# Patient Record
Sex: Female | Born: 1940
Health system: Southern US, Community
[De-identification: ages and names within clinical notes are randomized; demographics above are authoritative.]

## PROBLEM LIST (undated history)

## (undated) DIAGNOSIS — K589 Irritable bowel syndrome without diarrhea: Secondary | ICD-10-CM

## (undated) DIAGNOSIS — M349 Systemic sclerosis, unspecified: Secondary | ICD-10-CM

## (undated) DIAGNOSIS — K219 Gastro-esophageal reflux disease without esophagitis: Secondary | ICD-10-CM

## (undated) DIAGNOSIS — E785 Hyperlipidemia, unspecified: Secondary | ICD-10-CM

## (undated) DIAGNOSIS — E079 Disorder of thyroid, unspecified: Secondary | ICD-10-CM

## (undated) DIAGNOSIS — M1990 Unspecified osteoarthritis, unspecified site: Secondary | ICD-10-CM

## (undated) DIAGNOSIS — F32A Depression, unspecified: Secondary | ICD-10-CM

## (undated) DIAGNOSIS — I7300 Raynaud's syndrome without gangrene: Secondary | ICD-10-CM

## (undated) DIAGNOSIS — E039 Hypothyroidism, unspecified: Secondary | ICD-10-CM

## (undated) DIAGNOSIS — I10 Essential (primary) hypertension: Secondary | ICD-10-CM

## (undated) DIAGNOSIS — H269 Unspecified cataract: Secondary | ICD-10-CM

## (undated) DIAGNOSIS — R0600 Dyspnea, unspecified: Secondary | ICD-10-CM

## (undated) HISTORY — DX: Raynaud's syndrome without gangrene: I73.00

## (undated) HISTORY — DX: Dyspnea, unspecified: R06.00

## (undated) HISTORY — DX: Unspecified osteoarthritis, unspecified site: M19.90

## (undated) HISTORY — PX: HAND SURGERY: SHX662

## (undated) HISTORY — DX: Systemic sclerosis, unspecified: M34.9

## (undated) HISTORY — PX: MELANOMA EXCISION: SHX5266

## (undated) HISTORY — DX: Unspecified cataract: H26.9

## (undated) HISTORY — DX: Disorder of thyroid, unspecified: E07.9

---

## 1971-07-08 HISTORY — PX: CHOLECYSTECTOMY: SHX55

## 1972-07-07 HISTORY — PX: TOTAL ABDOMINAL HYSTERECTOMY: SHX209

## 1973-07-07 HISTORY — PX: BREAST ENHANCEMENT SURGERY: SHX7

## 1999-08-26 ENCOUNTER — Other Ambulatory Visit: Admission: RE | Admit: 1999-08-26 | Discharge: 1999-08-26 | Payer: Self-pay | Admitting: Obstetrics and Gynecology

## 2000-08-25 ENCOUNTER — Ambulatory Visit (HOSPITAL_COMMUNITY): Admission: RE | Admit: 2000-08-25 | Discharge: 2000-08-25 | Payer: Self-pay | Admitting: Gastroenterology

## 2000-08-25 ENCOUNTER — Encounter (INDEPENDENT_AMBULATORY_CARE_PROVIDER_SITE_OTHER): Payer: Self-pay | Admitting: Specialist

## 2000-10-06 ENCOUNTER — Emergency Department (HOSPITAL_COMMUNITY): Admission: EM | Admit: 2000-10-06 | Discharge: 2000-10-06 | Payer: Self-pay | Admitting: Emergency Medicine

## 2000-10-18 ENCOUNTER — Emergency Department (HOSPITAL_COMMUNITY): Admission: EM | Admit: 2000-10-18 | Discharge: 2000-10-18 | Payer: Self-pay | Admitting: Internal Medicine

## 2005-04-16 ENCOUNTER — Ambulatory Visit (HOSPITAL_COMMUNITY): Admission: RE | Admit: 2005-04-16 | Discharge: 2005-04-16 | Payer: Self-pay | Admitting: Family Medicine

## 2005-12-11 ENCOUNTER — Ambulatory Visit (HOSPITAL_COMMUNITY): Admission: RE | Admit: 2005-12-11 | Discharge: 2005-12-11 | Payer: Self-pay | Admitting: Family Medicine

## 2009-05-22 ENCOUNTER — Ambulatory Visit: Payer: Self-pay | Admitting: Vascular Surgery

## 2010-04-15 ENCOUNTER — Encounter: Payer: Self-pay | Admitting: Internal Medicine

## 2010-05-02 ENCOUNTER — Encounter: Payer: Self-pay | Admitting: Internal Medicine

## 2010-07-12 ENCOUNTER — Encounter: Payer: Self-pay | Admitting: Internal Medicine

## 2010-07-12 ENCOUNTER — Ambulatory Visit
Admission: RE | Admit: 2010-07-12 | Discharge: 2010-07-12 | Payer: Self-pay | Source: Home / Self Care | Attending: Internal Medicine | Admitting: Internal Medicine

## 2010-07-27 ENCOUNTER — Encounter: Payer: Self-pay | Admitting: Family Medicine

## 2010-08-06 DIAGNOSIS — I7300 Raynaud's syndrome without gangrene: Secondary | ICD-10-CM | POA: Insufficient documentation

## 2010-08-06 DIAGNOSIS — M349 Systemic sclerosis, unspecified: Secondary | ICD-10-CM | POA: Insufficient documentation

## 2010-08-06 DIAGNOSIS — M1990 Unspecified osteoarthritis, unspecified site: Secondary | ICD-10-CM | POA: Insufficient documentation

## 2010-08-07 ENCOUNTER — Encounter: Payer: Self-pay | Admitting: Internal Medicine

## 2010-08-07 ENCOUNTER — Institutional Professional Consult (permissible substitution) (INDEPENDENT_AMBULATORY_CARE_PROVIDER_SITE_OTHER): Payer: Medicare Other | Admitting: Internal Medicine

## 2010-08-07 ENCOUNTER — Ambulatory Visit: Admit: 2010-08-07 | Payer: Self-pay | Admitting: Internal Medicine

## 2010-08-07 DIAGNOSIS — M349 Systemic sclerosis, unspecified: Secondary | ICD-10-CM

## 2010-08-07 DIAGNOSIS — C439 Malignant melanoma of skin, unspecified: Secondary | ICD-10-CM | POA: Insufficient documentation

## 2010-08-07 DIAGNOSIS — R0609 Other forms of dyspnea: Secondary | ICD-10-CM

## 2010-08-07 DIAGNOSIS — R0989 Other specified symptoms and signs involving the circulatory and respiratory systems: Secondary | ICD-10-CM | POA: Insufficient documentation

## 2010-08-14 NOTE — Assessment & Plan Note (Signed)
Summary: Pulmonary/ new pt eval for ? PSS> sats on RA= ok x 3 laps    Vital Signs:  Patient profile:   70 year old female Height:      62.5 inches Weight:      129 pounds BMI:     23.30 O2 Sat:      100 % on Room air Temp:     98.2 degrees F oral Pulse rate:   85 / minute BP sitting:   120 / 82  (left arm) Cuff size:   regular  Vitals Entered By: Vernie Murders (August 07, 2010 9:44 AM)  O2 Flow:  Room air  Serial Vital Signs/Assessments:  Comments: 10:34 AM Ambulatory Pulse Oximetry  Resting; HR__71___    02 Sat__99%ra___  Lap1 (185 feet)   HR__97___   02 Sat__98%ra___ Lap2 (185 feet)   HR__102___   02 Sat__98%ra___    Lap3 (185 feet)   HR__99___   02 Sat__98%ra___  _x__Test Completed without Difficulty ___Test Stopped due to:   By: Vernie Murders    Visit Type:  Initial Consult Copy to:  Dr. Dierdre Forth Primary Provider/Referring Provider:  Gennette Pac, FNP  CC:  Dyspnea and fatigue.  History of Present Illness: 89 yowf never smoker with arthritis x 2000 then Raynaud's started 2009 then referred tp  Dierdre Forth by Dr Kathi Der office with dx of Scleroderma  in November 2011.  August 07, 2010  1st pulmonary office eval cc doe x  summer 2011 eg up steps, up from garden to house,  heavy housework.   Some dysphagia x one year stopped prilosec x 6 months due to concerns it was interfering B 12. minimal dry cough since then.  mild dysphagia.  Pt denies any significant sore throat, itching, sneezing,  nasal congestion or excess secretions,  fever, chills, sweats, unintended wt loss, pleuritic or exertional cp, hempoptysis, change in activity tolerance  orthopnea pnd or leg swelling. Pt also denies any obvious fluctuation in symptoms with weather or environmental change or other alleviating or aggravating factors.        Preventive Screening-Counseling & Management  Alcohol-Tobacco     Passive Smoke Exposure: yes  Current Medications (verified): 1)  Vitamin B-12 1000  Mcg/39ml Liqd (Cyanocobalamin) .... Every Month 2)  Synthroid 50 Mcg Tabs (Levothyroxine Sodium) .Marland Kitchen.. 1 Once Daily 3)  Fluoxetine Hcl 40 Mg Caps (Fluoxetine Hcl) .Marland Kitchen.. 1 Once Daily 4)  Clonazepam 1 Mg Tabs (Clonazepam) .Marland Kitchen.. 1 Once Daily 5)  Amlodipine Besylate 5 Mg Tabs (Amlodipine Besylate) .... 2 Two Times A Day 6)  Tramadol Hcl 50 Mg Tabs (Tramadol Hcl) .Marland Kitchen.. 1 Every 6 Hrs As Needed For Pain 7)  Voltaren 1 % Gel (Diclofenac Sodium) .... As Needed Up To 4 Times Per Day 8)  Phillips Colon Health  Caps (Probiotic Product) .Marland Kitchen.. 1 At Bedtime 9)  Vitamin D3 400 Unit Tabs (Cholecalciferol) .Marland Kitchen.. 1 At Bedtime 10)  Aspirin 81 Mg Tbec (Aspirin) .Marland Kitchen.. 1 At Bedtime 11)  Preservision Areds  Caps (Multiple Vitamins-Minerals) .Marland Kitchen.. 1 At Bedtime 12)  Tums E-X 750 750 Mg Chew (Calcium Carbonate Antacid) .... 2 At Bedtime  Allergies (verified): 1)  ! Codeine  Past History:  Past Medical History:  [name] (ICD-443.0) SCLERODERMA (ICD-710.1).......................Marland KitchenBeekman     - Echo 05/02/2010 WNL, NO  PAH OSTEOARTHRITIS (ICD-715.90) Dyspnea     - PFT's 07/12/2010 VC 99% and DLCO 62% with erratic FV loop     - Walking sats nl August 07, 2010 x 3  laps  Past Surgical History: Cholecystectomy 1973 Hysterectomy 1974 Breast augmentation 1975  Family History: Pancreatic CA- Father Sclerderma daughter  Social History: Married Children Never smoker No ETOH Retired- Nutritional therapist at [employer] Positive history of passive tobacco smoke exposure.  Passive Smoke Exposure:  yes  Review of Systems       The patient complains of shortness of breath with activity, non-productive cough, loss of appetite, difficulty swallowing, sneezing, itching, anxiety, depression, and joint stiffness or pain.  The patient denies shortness of breath at rest, productive cough, coughing up blood, chest pain, irregular heartbeats, acid heartburn, indigestion, weight change, abdominal pain, sore throat, tooth/dental  problems, headaches, nasal congestion/difficulty breathing through nose, ear ache, hand/feet swelling, rash, change in color of mucus, and fever.    Physical Exam  Additional Exam:  pleasant but anxious wf nad HEENT: nl dentition, turbinates, and orophanx. Nl external ear canals without cough reflex NECK :  without JVD/Nodes/TM/ nl carotid upstrokes bilaterally LUNGS: no acc muscle use, clear to A and P bilaterally without cough on insp or exp maneuvers CV:  RRR  no s3 or murmur or increase in P2, no edema  ABD:  soft and nontender with nl excursion in the supine position. No bruits or organomegaly, bowel sounds nl MS:  warm without deformities, calf tenderness, cyanosis or clubbing ? minimal sclerodactaly  SKIN: warm and dry without lesions   NEURO:  alert, approp, no deficits     Impression & Recommendations:  Problem # 1:  SCLERODERMA (ICD-710.1)  No evidence of sign ILD or PAH but needs serial f/u with repeat studies in 3 months then yearly unless change in symptoms  Discussed in detail all the  indications, usual  risks and alternatives  relative to the benefits with patient who agrees to proceed with conservative f/u for now      Problem # 2:  DYSPNEA (ICD-786.09)  Not reproduced here nor desat but assoc with cough off ppi,  dysphagia and erratic FV loop so needs trial of PPI/ H2 hs since most likely has element of es dysfunction from scleroderma with f/u GI if dysphagia not better on RX    Medications Added to Medication List This Visit: 1)  Vitamin B-12 1000 Mcg/11ml Liqd (Cyanocobalamin) .... Every month 2)  Synthroid 50 Mcg Tabs (Levothyroxine sodium) .Marland Kitchen.. 1 once daily 3)  Fluoxetine Hcl 40 Mg Caps (Fluoxetine hcl) .Marland Kitchen.. 1 once daily 4)  Clonazepam 1 Mg Tabs (Clonazepam) .Marland Kitchen.. 1 once daily 5)  Amlodipine Besylate 5 Mg Tabs (Amlodipine besylate) .... 2 two times a day 6)  Tramadol Hcl 50 Mg Tabs (Tramadol hcl) .Marland Kitchen.. 1 every 6 hrs as needed for pain 7)  Voltaren 1 % Gel  (Diclofenac sodium) .... As needed up to 4 times per day 8)  Phillips Colon Health Caps (Probiotic product) .Marland Kitchen.. 1 at bedtime 9)  Vitamin D3 400 Unit Tabs (Cholecalciferol) .Marland Kitchen.. 1 at bedtime 10)  Aspirin 81 Mg Tbec (Aspirin) .Marland Kitchen.. 1 at bedtime 11)  Preservision Areds Caps (Multiple vitamins-minerals) .Marland Kitchen.. 1 at bedtime 12)  Tums E-x 750 750 Mg Chew (Calcium carbonate antacid) .... 2 at bedtime 13)  Prilosec 20 Mg Cpdr (Omeprazole) .... One take  one 30-60 min before first meal of the day 14)  Pepcid 20 Mg Tabs (Famotidine) .... Take one by mouth at bedtime  Other Orders: New Patient Level V ([address]) Pulse Oximetry, Ambulatory ([address]) Prescription Created Electronically [id number])  Patient Instructions: 1)  Prilosec 20 mg Take  one 30-60 min  before first meal of the day and Pepcid 20 mg one at bedtime 2)  No tums, take calcium citrate a total of 1500 mg per day 3)  GERD (REFLUX)  is a common cause of respiratory symptoms. It commonly presents without heartburn and can be treated with medication, but also with lifestyle changes including avoidance of late meals, excessive alcohol, smoking cessation, and avoid fatty foods, chocolate, peppermint, colas, red wine, and acidic juices such as orange juice. NO MINT OR MENTHOL PRODUCTS SO NO COUGH DROPS  4)  USE SUGARLESS CANDY INSTEAD (jolley ranchers)  5)  NO OIL BASED VITAMINS  6)  Return to office in 3 months, sooner if needed with PFT's and cxr Prescriptions: PEPCID 20 MG TABS (FAMOTIDINE) take one by mouth at bedtime  #30 x 5   Entered by:   Carver Fila   Authorized by:   Nyoka Cowden MD   Signed by:   Carver Fila on 08/07/2010   Method used:   Electronically to        [facility]  [address] (retail)       [address]       [place], [place]  [address]       Ph: [phone]       Fax: [phone]   RxID:   [id number] PRILOSEC 20 MG CPDR (OMEPRAZOLE) one Take  one 30-60 min before first meal of the day  #30 x 5   Entered  by:   Carver Fila   Authorized by:   Nyoka Cowden MD   Signed by:   Carver Fila on 08/07/2010   Method used:   Electronically to        [facility]  [address] (retail)       [address]       [place], [place]  [address]       Ph: [phone]       Fax: [phone]   RxID:   [id number] PEPCID 20 MG TABS (FAMOTIDINE) take one by mouth at bedtime  #34 x 11   Entered and Authorized by:   Nyoka Cowden MD   Signed by:   Nyoka Cowden MD on 08/07/2010   Method used:   Electronically to        [facility]  [address] (retail)       [address]       [place], [place]  [address]       Ph: [phone]       Fax: [phone]   RxID:   [id number] PRILOSEC 20 MG CPDR (OMEPRAZOLE) one Take  one 30-60 min before first meal of the day  #34 x 11   Entered and Authorized by:   Nyoka Cowden MD   Signed by:   Nyoka Cowden MD on 08/07/2010   Method used:   Electronically to        [facility]  [address] (retail)       [address]       [place], [place]  [address]       Ph: [phone]       Fax: [phone]   RxID:   [id number]    Orders Added: 1)  New Patient Level V [[address]] 2)  Pulse Oximetry, Ambulatory [  [address]] 3)  Prescription Created Electronically [phone]     CXR  Procedure date:  04/15/2010  Findings:      No acute findings  Appended Document: Pulmonary/ new pt eval for ? PSS> sats on RA= ok x 3 laps  copy to Dr Dierdre Forth and Otilio Carpen at Dr [facility] office

## 2010-10-03 ENCOUNTER — Other Ambulatory Visit: Payer: Self-pay | Admitting: Internal Medicine

## 2010-10-03 DIAGNOSIS — M79641 Pain in right hand: Secondary | ICD-10-CM

## 2010-10-09 ENCOUNTER — Ambulatory Visit
Admission: RE | Admit: 2010-10-09 | Discharge: 2010-10-09 | Disposition: A | Discharge status: Home / Self Care | Payer: Medicare Other | Source: Ambulatory Visit | Attending: Internal Medicine | Admitting: Internal Medicine

## 2010-10-09 DIAGNOSIS — M79641 Pain in right hand: Secondary | ICD-10-CM

## 2010-10-09 MED ORDER — GADOBENATE DIMEGLUMINE 529 MG/ML IV SOLN
10.0000 mL | Freq: Once | INTRAVENOUS | Status: AC | PRN
  Administered 2010-10-09: 10 mL via INTRAVENOUS

## 2010-11-19 NOTE — Consult Note (Signed)
NEW PATIENT CONSULTATION   OLLA, DELANCEY  DOB:  1941/03/29                                       05/22/2009  ZOXWR#:[id number]   The patient is a 70 year old female patient referred for evaluation for  possible [name].  She states that over the past few years she  has had increasing symptoms of numbness intermittently primarily in the  hands but occasionally in the feet, the right worse than the left.  This  usually occurs in the third and fourth digits and she notices that her  fingers become quite white and pale in appearance followed by numbness.  She warms the hands by either sitting on them or putting them in warm  water, these symptoms eventually resolve but there is a short period of  discomfort associated with that.  She has tried Norvasc 5 mg per day  over the past few months and is not certain that this is changing her  symptomatology.  She has had no history of gangrene, infection or other  symptoms and denies upper or lower extremity claudication symptoms,  being able to ambulate long distances.  Her father had Raynaud's  disease.   CHRONIC MEDICAL PROBLEMS/PAST MEDICAL HISTORY:  1. Hyperlipidemia.  2. Hypothyroidism.  3. Previous melanoma removed from her back many years ago.  4. Negative for diabetes, hypertension, coronary artery disease, COPD      or stroke.   FAMILY HISTORY:  Positive for diabetes in her mother, Raynaud's in her  father who was a smoker and who died of pancreatic cancer.   SOCIAL HISTORY:  She is married, has 2 children, is retired.  Does not  use tobacco or alcohol.   REVIEW OF SYSTEMS:  Positive for weight gain, chest tightness  occasionally, dyspnea on exertion, dizziness, arthritis, joint pain,  muscle pain, depression and nervousness.  All other systems are  negative.   ALLERGIES:  To codeine, Vicodin, Talwin, Dilaudid, Lomotil, statins,  over-the-counter decongestants.   PHYSICAL EXAM:  Vital  signs:  Blood pressure is 134/58, heart rate 60,  respirations 14, temperature 97.4.  General:  She is a healthy-appearing  female in no apparent distress, alert and oriented x3.  Neck:  Supple,  3+ carotid pulses palpable.  No bruits are audible.  Neurological:  Normal.  No palpable adenopathy in the neck.  Chest:  Clear to  auscultation.  Cardiovascular:  Regular rhythm.  No murmurs.  Abdomen:  Soft, nontender with no masses.  She has 3+ femoral, popliteal,  posterior tibial and dorsalis pedis pulses bilaterally.  Both feet are  well-perfused with no edema.  Upper extremity exam reveals 3+ brachial,  radial and 2+ ulnar pulses bilaterally.  All fingers are pink and well-  perfused.   I ordered an upper extremity Doppler exam with cold immersion technique  to look for vasospasm.  She has normal waveforms and pressures at rest.  After cold immersion she does have abnormal waveforms particularly in  her right third and fourth digits which are damped and flattened  consistent with Raynaud's.   I think she does have Raynaud's syndrome and I explained to her that the  most important thing was try to avoid the situations which cause this  which could be cold weather in many occurrences and also to avoid  smoking which she does not do.  She has  tried beta-blockers which  sometimes can help and calcium channel blocker can also sometimes be  beneficial but not consistently.  I have no other suggestions for her  and do not think this will lead to any loss of digits or other serious  problems other than inconvenience.   Quita Skye Hart [place], M.D.  Electronically Signed   JDL/MEDQ  D:  05/22/2009  T:  05/23/2009  Job:  [id number]   cc:   Paulene Floor, NP

## 2010-11-22 NOTE — Procedures (Signed)
[facility]  Patient:    Haley Trevino, Haley Trevino                   MRN: [id number] Proc. Date: 08/25/00 Adm. Date:  [id number] Attending:  Nelda Marseille CC:         Doreatha Lew, M.D.   Procedure Report  PROCEDURE:  Esophagogastroduodenoscopy with biopsy.  ENDOSCOPIST:  Petra Kuba, M.D.  INDICATIONS:  Patient with long-term upper tract symptoms.  Want to confirm hiatal hernia, rule out Barretts.  Consent was signed after risks, benefits, methods, and options were thoroughly discussed in the office.  ADDITIONAL MEDICINES USED FOR THIS PROCEDURE:  Demerol 10 mg, Versed 2 mg.  DESCRIPTION OF PROCEDURE:  Video endoscope was inserted by direct vision.  The proximal and mid esophagus were normal.  In the distal esophagus was a tiny hiatal hernia with some very minimal distal esophagitis.  A few scattered biopsies of this area were obtained at the end of the procedure.  The scope was advanced into the stomach, advanced through normal pylorus into a normal duodenal bulb and around the cecum to normal second portion of the duodenum. The scope was then withdrawn back to the bulb, and a good look there ruled out abnormalities at that location.  A quick look at the ampulla on withdrawal was normal.  The scope was withdrawn back to the stomach and retroflexed.  The cardia, fundus, angularis, lesser and greater curve, and were all normal.  The scope was straightened and straight visualization of the stomach was normal except for some minimal antritis.  Air was suctioned.  The scope was slowly withdrawn back to about 18 cm.  No additional esophageal findings were seen. The scope was then advanced to the distal esophagus.  Biopsies were obtained at this point.  The scope was reinserted into the stomach.  Air was suctioned. The scope was removed.  The patient tolerated the procedure well, and there was no obvious immediate complication.  ENDOSCOPIC  DIAGNOSES: 1. Tiny hiatal hernia. 2. Minimal distal esophagitis status post biopsy. 3. Minimal antritis. 6. Otherwise normal esophagogastroduodenoscopy.  PLAN:    Continue Nexium.  Await pathology.  GI followup p.r.n. or in two months recheck symptoms to make sure no further workup plans are needed. DD:  08/25/00 TD:  08/25/00 Job: [id number]

## 2010-11-22 NOTE — Procedures (Signed)
[facility]  Patient:    Haley Trevino, Haley Trevino                   MRN: [id number] Proc. Date: 08/25/00 Adm. Date:  [id number] Attending:  Nelda Marseille CC:         Doreatha Lew, M.D.   Procedure Report  PROCEDURES PERFORMED:  Colonoscopy with polypectomy.  INDICATIONS:  Patient with some lower bowel complaints, bright red blood per rectum, due for colonic screening, questionable family history of colon cancer in her father. Consent was signed after risks and benefits, methods and options were thoroughly discussed in the office with the patient and his wife.  MEDICINES USED:  Demerol 70 mg, Versed 10 mg.  DESCRIPTION OF PROCEDURE:  Rectal inspection is pertinent for external hemorrhoids, small. Digital exam was negative. The pediatric video colonoscope was inserted with some difficulty due to a tortuous sigmoid with some looping. We were able to roll her on her back using more right sided than left abdominal pressure and able to advance to the cecum.  On insertion, normal abnormalities were seen. The cecum was identified by the appendiceal orifice and the ileocecal valve. Prep was adequate; there was some liquid stool that required suctioning. Only on slow withdrawal through the colon at the approximate level of the splenic flexure, a questionable tiny polyp was seen which was cold biopsied x 2. No right sided lesions were seen. The scope was slowly withdrawn back through the left side of the colon and in the distal sigmoid a 2 mm polyp was seen and hot biopsied x 1. The scope was slowly withdrawn back to the rectum and retroflexed and pertinent for some internal hemorrhoids. The scope was straightened, the air was withdrawn, and the scope removed. The patient tolerated the procedure well. There was no obvious immediate complication.  ENDOSCOPIC DIAGNOSES: 1. Internal and external hemorrhoids. 2. Distal sigmoid small polyp hot biopsied. 3.  Proximal level of the splenic flexure a tiny polyp cold biopsied. 4. Otherwise within normal limits to the cecum.  PLAN:  Await pathology to determine future colonic screening, otherwise continue workup with a 1-time esophagogastroduodenoscopy to rule out Barretts. DD:  08/25/00 TD:  08/25/00 Job: [id number]

## 2010-11-22 NOTE — Procedures (Signed)
[facility]  Patient:    Haley Trevino, Haley Trevino                   MRN: [id number] Proc. Date: 08/25/00 Adm. Date:  [id number] Attending:  Nelda Marseille CC:         Doreatha Lew, M.D.   Procedure Report  PROCEDURE:  Colonoscopy with polypectomy.  ENDOSCOPIST:  Petra Kuba, M.D.  INDICATIONS:  Patient with some lower bowel complaints, bright red blood per rectum, due for colonic screening.  Questionable family history of colon cancer in father.  Consent was signed after risks, benefits, methods, and options were thoroughly discussed in the office.  MEDICINES USED:  Demerol 70, Versed 10.  DESCRIPTION OF PROCEDURE:  Rectal inspection was pertinent for external hemorrhoids.  Digital exam was negative.  The pediatric video colonoscope was inserted and with some difficulty due to a tortuous sigmoid with some looping, we were able to roll her on her back, used more right-sided than left abdominal pressure, and able to around to the cecum.  On insertion, no abnormalities were seen.  The cecum was identified by the appendiceal orifice and the ileocecal valve. Prep was adequate.  There was some liquid stool that required suctioning only.  On slow withdrawal through the colon, at the approximate level of the splenic flexure, a questionable tiny polyp was seen and was biopsied x 2.  No right-sided lesions were seen.  The scope was slowly withdrawn back through the left side of the colon.  In the distal sigmoid, a 2 mm was seen and hot biopsied x 1.  The scope was slowly withdrawn back to the rectum and retroflexed, pertinent for some internal hemorrhoids.  The scope was straightened, air was withdrawn, scope was removed.  The patient tolerated the procedure well.  There was no obvious immediate complication.  ENDOSCOPIC DIAGNOSIS: 1. Internal and external hemorrhoids. 2. Distal sigmoid small polyp history of biopsied. 3. Approximate level of splenic  flexure, tiny polyp cold biopsied. 3. Otherwise within normal limits to the cecum.  PLAN:  Await pathology to determine future colonic screening.  Otherwise, continue workup with a one-time EGD to rule out Barretts. DD:  08/25/00 TD:  08/25/00 Job: [id number]

## 2010-11-22 NOTE — Procedures (Signed)
[facility]  Patient:    Haley Trevino, Haley Trevino                   MRN: [id number] Proc. Date: 08/25/00 Adm. Date:  [id number] Attending:  Nelda Marseille CC:         Doreatha Lew, M.D.   Procedure Report  PROCEDURES PERFORMED:  Esophagogastroduodenoscopy with biopsy.  INDICATIONS:  A patient with long-term upper tract symptoms, one of a confirmed hiatal hernia, rule out Barretts. Consent was signed after risks, benefits, methods and options were thoroughly discussed in the office.  MEDICINES USED:  Demerol 10 mg, Versed 2 mg.  DESCRIPTION OF PROCEDURE:  The video endoscope was inserted by direct vision. The proximal and midesophagus were normal. In the distal esophagus was a tiny hiatal hernia with some very minimal distal esophagitis. A few scattered. The biopsies of this area was obtained at the end of the procedure. The scope was advanced into the stomach and then through a normal pylorus into a normal duodenal bulb and around the C-loop to a normal second portion of the duodenum. The scope was withdrawn back to the bulb and a good look there ruled out abnormalities in that location. A quick look at the ampulla on withdrawal was normal. The scope was withdrawn back to the stomach and retroflexed. The angularis, cardia, fundus, lessor and greater curve were all normal.  The scope was straightened and straight visualization of the stomach was normal except for some minimal antritis. Air was suctioned and the scope slowly withdrawn back to about 18 cm. No additional esophageal findings were seen. The scope was then advanced to the distal esophagus and biopsies were obtained at this point. The scope was reinserted into the stomach, air was suctioned, the scope removed. The patient tolerated the procedure well. There was no obvious immediate complication.  ENDOSCOPIC DIAGNOSES: 1. Tiny hiatal hernia. 2. Minimal distal esophagitis, status post  biopsy. 3. Minimal antritis. 4. Otherwise normal esophagogastroduodenoscopy.  PLAN:  Continue Nexium, await pathology. Gastrointestinal follow-up p.r.n. or in 2 months recheck symptoms to make sure no further workup plans are needed. DD:  08/25/00 TD:  08/25/00 Job: [id number]

## 2011-05-12 ENCOUNTER — Encounter: Payer: Self-pay | Admitting: Internal Medicine

## 2011-05-13 ENCOUNTER — Other Ambulatory Visit: Payer: Self-pay | Admitting: Family Medicine

## 2011-05-13 ENCOUNTER — Encounter: Payer: Self-pay | Admitting: Internal Medicine

## 2011-05-13 ENCOUNTER — Ambulatory Visit (INDEPENDENT_AMBULATORY_CARE_PROVIDER_SITE_OTHER): Payer: Medicare Other | Admitting: Internal Medicine

## 2011-05-13 VITALS — BP 138/70 | HR 69 | Temp 97.6°F | Ht 62.0 in | Wt 125.0 lb

## 2011-05-13 DIAGNOSIS — R0609 Other forms of dyspnea: Secondary | ICD-10-CM

## 2011-05-13 DIAGNOSIS — R591 Generalized enlarged lymph nodes: Secondary | ICD-10-CM

## 2011-05-13 DIAGNOSIS — R0989 Other specified symptoms and signs involving the circulatory and respiratory systems: Secondary | ICD-10-CM

## 2011-05-13 MED ORDER — FAMOTIDINE 20 MG PO TABS: ORAL_TABLET | ORAL | Status: DC

## 2011-05-13 NOTE — Progress Notes (Deleted)
wwmw

## 2011-05-13 NOTE — Patient Instructions (Signed)
We need to see you in [facility] after August 08 2011 for cxr and pft's sooner if loosing ground with activity tolerance.

## 2011-05-13 NOTE — Assessment & Plan Note (Signed)
-   PFT's 07/12/2010 VC 99% and DLCO 62% with erratic FV loop  - Walking sats nl August 07, 2010 x 3 laps   Resolved with rx of GERD and no evidence to date of PAH from scleroderma or ILD but did recommend that she complete cxr and pft's yearly

## 2011-05-13 NOTE — Progress Notes (Signed)
Subjective:     Patient ID: Haley Trevino, female   DOB: March 28, 1941, 70 y.o.   MRN: [id number]  HPI  56 yowf never smoker with arthritis x 2000 then Raynaud's started 2009 then referred tp Haley Trevino by Haley Haley Trevino office with dx of Scleroderma in November 2011.   August 07, 2010 1st pulmonary office eval cc doe x summer 2011 eg up steps, up from garden to house, heavy housework. Some dysphagia x one year stopped prilosec x 6 months due to concerns it was interfering B 12. minimal dry cough since then. mild dysphagia.  rec 1) Prilosec 20 mg Take one 30-60 min before first meal of the day and Pepcid 20 mg one at bedtime  2) No tums, take calcium citrate a total of 1500 mg per day  3) GERD diet   PEPCID 20 MG at hs     05/13/2011 f/u ov/Haley Trevino cc overall some better with walking, less cough but mild dysphagia even on ppi and pepcid and plans to see Haley Trevino for dysphagia and diarrhea.  No significant limiting sob > Walks up to a mile s stopping at a good pace  avg qod   Pt denies any significant sore throat, itching, sneezing, nasal congestion or excess secretions, fever, chills, sweats, unintended wt loss, pleuritic or exertional cp, hempoptysis, change in activity tolerance orthopnea pnd or leg swelling. Pt also denies any obvious fluctuation in symptoms with weather or environmental change or other alleviating or aggravating factors.  Preventive Screening-Counseling & Management  Alcohol-Tobacco      Allergies 1) ! Codeine  Past History:    Past Medical History:  [name] (ICD-443.0)  SCLERODERMA (ICD-710.1).......................Marland KitchenBeekman  - Echo 05/02/2010 WNL, NO PAH  OSTEOARTHRITIS (ICD-715.90)  IBS................................................................Marland Kitchen  Haley Trevino  Recurrent diarrhea p abx Dyspnea  - PFT's 07/12/2010 VC 99% and DLCO 62% with erratic FV loop  - Walking sats nl August 07, 2010 x 3 laps     Past Surgical History:  Cholecystectomy 1973    Hysterectomy 1974  Breast augmentation 1975  Family History:  Pancreatic CA- Father  Sclerderma daughter    Social History:  Married  Children  Never smoker  No ETOH  Retired- Nutritional therapist at [employer]  Positive history of passive tobacco smoke exposure.  Passive Smoke Exposure: yes Review of Systems      Review of Systems     Objective:   Physical Exam pleasant but anxious wf nad  Wt 125 05/13/2011  HEENT: edentulous turbinates, and orophanx. Nl external ear canals without cough reflex  NECK : without JVD/Nodes/TM/ nl carotid upstrokes bilaterally  LUNGS: no acc muscle use, clear to A and P bilaterally without cough on insp or exp maneuvers  CV: RRR no s3 or murmur or increase in P2, no edema  ABD: soft and nontender with nl excursion in the supine position. No bruits or organomegaly, bowel sounds nl  MS: warm without deformities, calf tenderness, cyanosis or clubbing ? minimal sclerodactaly  SKIN: warm and dry without lesions  NEURO: alert, approp, no deficits    Assessment:          Plan:

## 2011-05-16 ENCOUNTER — Ambulatory Visit (HOSPITAL_COMMUNITY)
Admission: RE | Admit: 2011-05-16 | Discharge: 2011-05-16 | Disposition: A | Discharge status: Home / Self Care | Payer: Medicare Other | Source: Ambulatory Visit | Attending: Family Medicine | Admitting: Family Medicine

## 2011-05-16 DIAGNOSIS — R591 Generalized enlarged lymph nodes: Secondary | ICD-10-CM

## 2011-05-16 DIAGNOSIS — I6529 Occlusion and stenosis of unspecified carotid artery: Secondary | ICD-10-CM | POA: Insufficient documentation

## 2011-05-16 DIAGNOSIS — I658 Occlusion and stenosis of other precerebral arteries: Secondary | ICD-10-CM | POA: Insufficient documentation

## 2011-05-16 DIAGNOSIS — R599 Enlarged lymph nodes, unspecified: Secondary | ICD-10-CM | POA: Insufficient documentation

## 2011-05-16 DIAGNOSIS — E041 Nontoxic single thyroid nodule: Secondary | ICD-10-CM | POA: Insufficient documentation

## 2011-05-27 ENCOUNTER — Ambulatory Visit (HOSPITAL_COMMUNITY)
Admission: RE | Admit: 2011-05-27 | Discharge: 2011-05-27 | Disposition: A | Discharge status: Home / Self Care | Payer: Medicare Other | Source: Ambulatory Visit | Attending: Family Medicine | Admitting: Family Medicine

## 2011-05-27 DIAGNOSIS — I7090 Unspecified atherosclerosis: Secondary | ICD-10-CM

## 2011-05-27 DIAGNOSIS — R42 Dizziness and giddiness: Secondary | ICD-10-CM

## 2011-05-27 DIAGNOSIS — I658 Occlusion and stenosis of other precerebral arteries: Secondary | ICD-10-CM | POA: Insufficient documentation

## 2011-05-27 DIAGNOSIS — I6529 Occlusion and stenosis of unspecified carotid artery: Secondary | ICD-10-CM | POA: Insufficient documentation

## 2011-05-27 NOTE — Progress Notes (Signed)
Bilateral carotid artery duplex completed.  Preliminary report is no evidence of significant ICA stenosis. Smiley Houseman 05/27/2011, 10:30 AM

## 2011-08-07 DIAGNOSIS — F325 Major depressive disorder, single episode, in full remission: Secondary | ICD-10-CM | POA: Diagnosis not present

## 2011-08-28 ENCOUNTER — Other Ambulatory Visit: Payer: Self-pay | Admitting: Internal Medicine

## 2011-09-03 DIAGNOSIS — E538 Deficiency of other specified B group vitamins: Secondary | ICD-10-CM | POA: Diagnosis not present

## 2011-09-03 DIAGNOSIS — S20219A Contusion of unspecified front wall of thorax, initial encounter: Secondary | ICD-10-CM | POA: Diagnosis not present

## 2011-09-08 ENCOUNTER — Other Ambulatory Visit: Payer: Self-pay | Admitting: Internal Medicine

## 2011-09-08 DIAGNOSIS — R070 Pain in throat: Secondary | ICD-10-CM | POA: Diagnosis not present

## 2011-09-08 DIAGNOSIS — J209 Acute bronchitis, unspecified: Secondary | ICD-10-CM | POA: Diagnosis not present

## 2011-09-23 DIAGNOSIS — M349 Systemic sclerosis, unspecified: Secondary | ICD-10-CM | POA: Diagnosis not present

## 2011-09-23 DIAGNOSIS — L03019 Cellulitis of unspecified finger: Secondary | ICD-10-CM | POA: Diagnosis not present

## 2011-09-23 DIAGNOSIS — I7300 Raynaud's syndrome without gangrene: Secondary | ICD-10-CM | POA: Diagnosis not present

## 2011-09-23 DIAGNOSIS — M159 Polyosteoarthritis, unspecified: Secondary | ICD-10-CM | POA: Diagnosis not present

## 2011-10-03 DIAGNOSIS — L219 Seborrheic dermatitis, unspecified: Secondary | ICD-10-CM | POA: Diagnosis not present

## 2011-10-03 DIAGNOSIS — L819 Disorder of pigmentation, unspecified: Secondary | ICD-10-CM | POA: Diagnosis not present

## 2011-10-03 DIAGNOSIS — Z85820 Personal history of malignant melanoma of skin: Secondary | ICD-10-CM | POA: Diagnosis not present

## 2011-10-03 DIAGNOSIS — L570 Actinic keratosis: Secondary | ICD-10-CM | POA: Diagnosis not present

## 2011-10-03 DIAGNOSIS — D239 Other benign neoplasm of skin, unspecified: Secondary | ICD-10-CM | POA: Diagnosis not present

## 2011-10-06 ENCOUNTER — Ambulatory Visit (INDEPENDENT_AMBULATORY_CARE_PROVIDER_SITE_OTHER): Payer: Medicare Other | Admitting: Internal Medicine

## 2011-10-06 ENCOUNTER — Ambulatory Visit (INDEPENDENT_AMBULATORY_CARE_PROVIDER_SITE_OTHER)
Admission: RE | Admit: 2011-10-06 | Discharge: 2011-10-06 | Disposition: A | Discharge status: Home / Self Care | Payer: Medicare Other | Source: Ambulatory Visit | Attending: Internal Medicine | Admitting: Internal Medicine

## 2011-10-06 ENCOUNTER — Encounter: Payer: Self-pay | Admitting: Internal Medicine

## 2011-10-06 VITALS — BP 124/70 | HR 70 | Temp 98.3°F | Ht 62.0 in | Wt 133.0 lb

## 2011-10-06 DIAGNOSIS — M349 Systemic sclerosis, unspecified: Secondary | ICD-10-CM | POA: Diagnosis not present

## 2011-10-06 DIAGNOSIS — R0600 Dyspnea, unspecified: Secondary | ICD-10-CM

## 2011-10-06 DIAGNOSIS — R0609 Other forms of dyspnea: Secondary | ICD-10-CM | POA: Diagnosis not present

## 2011-10-06 DIAGNOSIS — R0989 Other specified symptoms and signs involving the circulatory and respiratory systems: Secondary | ICD-10-CM

## 2011-10-06 DIAGNOSIS — R0602 Shortness of breath: Secondary | ICD-10-CM | POA: Diagnosis not present

## 2011-10-06 DIAGNOSIS — J40 Bronchitis, not specified as acute or chronic: Secondary | ICD-10-CM | POA: Diagnosis not present

## 2011-10-06 LAB — PULMONARY FUNCTION TEST

## 2011-10-06 MED ORDER — OMEPRAZOLE 20 MG PO CPDR: DELAYED_RELEASE_CAPSULE | ORAL | Status: DC

## 2011-10-06 MED ORDER — FAMOTIDINE 20 MG PO TABS: ORAL_TABLET | ORAL | Status: DC

## 2011-10-06 NOTE — Assessment & Plan Note (Addendum)
-   Echo 05/02/2010 WNL, NO PAH   Recommend recheck a echo 04/2012 for apples to apples comparison to previous study 2 years apart

## 2011-10-06 NOTE — Progress Notes (Signed)
Quick Note:  Spoke with pt and notified of results per Dr. Wert. Pt verbalized understanding and denied any questions.  ______ 

## 2011-10-06 NOTE — Progress Notes (Signed)
Subjective:     Patient ID: Haley Trevino, female   DOB: 1940-08-13    MRN: [id number]  HPI  67  yowf never smoker with arthritis x 2000 then Raynaud's started 2009    referred to pulmonary clinic in [facility]   by Dr [facility] office with dx of Scleroderma in November 2011 Dierdre Forth)   August 07, 2010 1st pulmonary office eval cc doe x summer 2011 eg up steps, up from garden to house, heavy housework. Some dysphagia x one year stopped prilosec x 6 months due to concerns it was interfering B 12. minimal dry cough since then, mild dysphagia.  rec 1) Prilosec 20 mg Take one 30-60 min before first meal of the day and Pepcid 20 mg one at bedtime  2) No tums, take calcium citrate a total of 1500 mg per day  3) GERD diet   PEPCID 20 MG at hs     05/13/2011 f/u ov/Margee Trentham cc overall some better with walking, less cough but mild dysphagia even on ppi and pepcid and plans to see Dr Ewing Schlein for dysphagia and diarrhea.  No significant limiting sob > Walks up to a mile s stopping at a good pace  avg qod rec F/u with pft's in [place] office   10/06/2011 f/u ov/Amelda Hapke cc worse x 2 months, off ppi and pepcid ? Why assoc with persisent nasal congestion p "bronchitis" caught from husband rxd with pred and zpak - has only mild dysphagia and hb and didn't think it was important to rx gerd unless hb bad. No purulent spurum  Sleeping ok without nocturnal  or early am exacerbation  of respiratory  c/o's or need for noct saba. Also denies any obvious fluctuation of symptoms with weather or environmental changes or other aggravating or alleviating factors except as outlined above   ROS  At present neg for  any significant sore throat,   dental problems, itching, sneezing,  nasal congestion or excess/ purulent secretions, ear ache,   fever, chills, sweats, unintended wt loss, pleuritic or exertional cp, hemoptysis, palpitations, orthopnea pnd or leg swelling.  Also denies presyncope, palpitations, heartburn, abdominal  pain, anorexia, nausea, vomiting, diarrhea  or change in bowel or urinary habits, change in stools or urine, dysuria,hematuria,  rash, arthralgias, visual complaints, headache, numbness weakness or ataxia or problems with walking or coordination. No noted change in mood/affect or memory.                     Allergies 1) ! Codeine      Past Medical History:  [name] (ICD-443.0)  SCLERODERMA (ICD-710.1).......................Marland KitchenBeekman  - Echo 05/02/2010 WNL, NO PAH  OSTEOARTHRITIS (ICD-715.90)  IBS................................................................Marland Kitchen  [name]  Recurrent diarrhea p abx      Past Surgical History:  Cholecystectomy 1973  Hysterectomy 1974  Breast augmentation 1975  Family History:  Pancreatic CA- Father  Sclerderma daughter    Social History:  Married  Children  Never smoker  No ETOH  Retired- Nutritional therapist at [employer]  Positive history of passive tobacco smoke exposure.  Passive Smoke Exposure: yes Review of Systems            Objective:   Physical Exam pleasant but anxious wf nad  Wt 125 05/13/2011 > 10/06/2011  133 HEENT: edentulous turbinates, and orophanx. Nl external ear canals without cough reflex  NECK : without JVD/Nodes/TM/ nl carotid upstrokes bilaterally  LUNGS: no acc muscle use, clear to A and P bilaterally without cough on insp or exp maneuvers  CV: RRR no s3 or murmur or increase in P2, no edema  ABD: soft and nontender with nl excursion in the supine position. No bruits or organomegaly, bowel sounds nl  MS: warm without deformities, calf tenderness, cyanosis or clubbing ? minimal sclerodactaly        CXR  10/06/2011 :  No acute abnormality.   Assessment:          Plan:

## 2011-10-06 NOTE — Patient Instructions (Addendum)
Try prilosec 20mg   Take 30-60 min before first meal of the day and Pepcid 20 mg one bedtime   You will need a repeat echocardiogram in 04/2012 to screen for pulmonary hypertension due your scleroderma but this can be coordinated through Paulene Floor or Dr Shawnee Knapp office   No further pulmonary follow up is needed at this point unless symptoms worsen.

## 2011-10-06 NOTE — Progress Notes (Signed)
PFT done today. 

## 2011-10-06 NOTE — Assessment & Plan Note (Signed)
-   PFT's 07/12/2010 VC 99% and DLCO 62% with erratic FV loop   - PFT's 10/06/2011 VC 113% and DLCO 73% with much less erratic FV loop - Walking sats nl August 07, 2010 x 3 laps  - 10/06/2011  Walked RA x 3 laps @ 185 ft each stopped due to  Sats of 87% at the very end of the study  She probably does have very mild ILD assoc with scleroderma but this is almost impossible to tease out from what we'd see related to gerd/ low grade asp from scleroderma esophageal involvment  Therefore reinforced diet/ ppi, h2hs and importance of serial f/u

## 2011-10-13 ENCOUNTER — Encounter: Payer: Self-pay | Admitting: Internal Medicine

## 2012-01-26 DIAGNOSIS — M349 Systemic sclerosis, unspecified: Secondary | ICD-10-CM | POA: Diagnosis not present

## 2012-01-26 DIAGNOSIS — M159 Polyosteoarthritis, unspecified: Secondary | ICD-10-CM | POA: Diagnosis not present

## 2012-01-26 DIAGNOSIS — I7300 Raynaud's syndrome without gangrene: Secondary | ICD-10-CM | POA: Diagnosis not present

## 2012-03-01 DIAGNOSIS — I10 Essential (primary) hypertension: Secondary | ICD-10-CM | POA: Diagnosis not present

## 2012-03-01 DIAGNOSIS — E039 Hypothyroidism, unspecified: Secondary | ICD-10-CM | POA: Diagnosis not present

## 2012-03-01 DIAGNOSIS — E559 Vitamin D deficiency, unspecified: Secondary | ICD-10-CM | POA: Diagnosis not present

## 2012-03-01 DIAGNOSIS — E785 Hyperlipidemia, unspecified: Secondary | ICD-10-CM | POA: Diagnosis not present

## 2012-03-04 ENCOUNTER — Other Ambulatory Visit: Payer: Self-pay | Admitting: Family Medicine

## 2012-03-04 DIAGNOSIS — E041 Nontoxic single thyroid nodule: Secondary | ICD-10-CM

## 2012-03-05 ENCOUNTER — Other Ambulatory Visit: Payer: Medicare Other

## 2012-03-10 ENCOUNTER — Ambulatory Visit
Admission: RE | Admit: 2012-03-10 | Discharge: 2012-03-10 | Disposition: A | Discharge status: Home / Self Care | Payer: Medicare Other | Source: Ambulatory Visit | Attending: Family Medicine | Admitting: Family Medicine

## 2012-03-10 DIAGNOSIS — E041 Nontoxic single thyroid nodule: Secondary | ICD-10-CM

## 2012-04-15 DIAGNOSIS — Z23 Encounter for immunization: Secondary | ICD-10-CM | POA: Diagnosis not present

## 2012-05-04 DIAGNOSIS — F325 Major depressive disorder, single episode, in full remission: Secondary | ICD-10-CM | POA: Diagnosis not present

## 2012-05-31 DIAGNOSIS — M159 Polyosteoarthritis, unspecified: Secondary | ICD-10-CM | POA: Diagnosis not present

## 2012-05-31 DIAGNOSIS — I7300 Raynaud's syndrome without gangrene: Secondary | ICD-10-CM | POA: Diagnosis not present

## 2012-05-31 DIAGNOSIS — M349 Systemic sclerosis, unspecified: Secondary | ICD-10-CM | POA: Diagnosis not present

## 2012-06-09 DIAGNOSIS — Z23 Encounter for immunization: Secondary | ICD-10-CM | POA: Diagnosis not present

## 2012-06-09 DIAGNOSIS — M949 Disorder of cartilage, unspecified: Secondary | ICD-10-CM | POA: Diagnosis not present

## 2012-06-09 DIAGNOSIS — M899 Disorder of bone, unspecified: Secondary | ICD-10-CM | POA: Diagnosis not present

## 2012-06-16 DIAGNOSIS — I10 Essential (primary) hypertension: Secondary | ICD-10-CM | POA: Diagnosis not present

## 2012-06-16 DIAGNOSIS — E785 Hyperlipidemia, unspecified: Secondary | ICD-10-CM | POA: Diagnosis not present

## 2012-06-16 DIAGNOSIS — E559 Vitamin D deficiency, unspecified: Secondary | ICD-10-CM | POA: Diagnosis not present

## 2012-06-16 DIAGNOSIS — E039 Hypothyroidism, unspecified: Secondary | ICD-10-CM | POA: Diagnosis not present

## 2012-06-24 DIAGNOSIS — F329 Major depressive disorder, single episode, unspecified: Secondary | ICD-10-CM | POA: Diagnosis not present

## 2012-06-24 DIAGNOSIS — F3289 Other specified depressive episodes: Secondary | ICD-10-CM | POA: Diagnosis not present

## 2012-06-24 DIAGNOSIS — E039 Hypothyroidism, unspecified: Secondary | ICD-10-CM | POA: Diagnosis not present

## 2012-07-26 DIAGNOSIS — H2510 Age-related nuclear cataract, unspecified eye: Secondary | ICD-10-CM | POA: Diagnosis not present

## 2012-08-16 DIAGNOSIS — I7300 Raynaud's syndrome without gangrene: Secondary | ICD-10-CM | POA: Diagnosis not present

## 2012-08-16 DIAGNOSIS — M159 Polyosteoarthritis, unspecified: Secondary | ICD-10-CM | POA: Diagnosis not present

## 2012-08-16 DIAGNOSIS — M349 Systemic sclerosis, unspecified: Secondary | ICD-10-CM | POA: Diagnosis not present

## 2012-08-16 DIAGNOSIS — M810 Age-related osteoporosis without current pathological fracture: Secondary | ICD-10-CM | POA: Diagnosis not present

## 2012-08-25 DIAGNOSIS — M19049 Primary osteoarthritis, unspecified hand: Secondary | ICD-10-CM | POA: Diagnosis not present

## 2012-08-25 DIAGNOSIS — L98499 Non-pressure chronic ulcer of skin of other sites with unspecified severity: Secondary | ICD-10-CM | POA: Diagnosis not present

## 2012-08-25 DIAGNOSIS — M349 Systemic sclerosis, unspecified: Secondary | ICD-10-CM | POA: Diagnosis not present

## 2012-08-25 DIAGNOSIS — I7300 Raynaud's syndrome without gangrene: Secondary | ICD-10-CM | POA: Diagnosis not present

## 2012-08-27 DIAGNOSIS — Z01810 Encounter for preprocedural cardiovascular examination: Secondary | ICD-10-CM | POA: Diagnosis not present

## 2012-08-27 DIAGNOSIS — I7300 Raynaud's syndrome without gangrene: Secondary | ICD-10-CM | POA: Diagnosis not present

## 2012-08-30 DIAGNOSIS — I7300 Raynaud's syndrome without gangrene: Secondary | ICD-10-CM | POA: Diagnosis not present

## 2012-08-30 DIAGNOSIS — Z888 Allergy status to other drugs, medicaments and biological substances status: Secondary | ICD-10-CM | POA: Diagnosis not present

## 2012-08-30 DIAGNOSIS — I739 Peripheral vascular disease, unspecified: Secondary | ICD-10-CM | POA: Diagnosis not present

## 2012-08-30 DIAGNOSIS — Z859 Personal history of malignant neoplasm, unspecified: Secondary | ICD-10-CM | POA: Diagnosis not present

## 2012-08-30 DIAGNOSIS — Z886 Allergy status to analgesic agent status: Secondary | ICD-10-CM | POA: Diagnosis not present

## 2012-08-30 DIAGNOSIS — E7800 Pure hypercholesterolemia, unspecified: Secondary | ICD-10-CM | POA: Diagnosis not present

## 2012-08-30 DIAGNOSIS — Z87891 Personal history of nicotine dependence: Secondary | ICD-10-CM | POA: Diagnosis not present

## 2012-08-30 DIAGNOSIS — E039 Hypothyroidism, unspecified: Secondary | ICD-10-CM | POA: Diagnosis not present

## 2012-08-30 DIAGNOSIS — M349 Systemic sclerosis, unspecified: Secondary | ICD-10-CM | POA: Diagnosis not present

## 2012-08-30 DIAGNOSIS — L98499 Non-pressure chronic ulcer of skin of other sites with unspecified severity: Secondary | ICD-10-CM | POA: Diagnosis not present

## 2012-08-31 DIAGNOSIS — I739 Peripheral vascular disease, unspecified: Secondary | ICD-10-CM | POA: Diagnosis not present

## 2012-08-31 DIAGNOSIS — E039 Hypothyroidism, unspecified: Secondary | ICD-10-CM | POA: Diagnosis not present

## 2012-08-31 DIAGNOSIS — I7300 Raynaud's syndrome without gangrene: Secondary | ICD-10-CM | POA: Diagnosis not present

## 2012-08-31 DIAGNOSIS — E7800 Pure hypercholesterolemia, unspecified: Secondary | ICD-10-CM | POA: Diagnosis not present

## 2012-08-31 DIAGNOSIS — M349 Systemic sclerosis, unspecified: Secondary | ICD-10-CM | POA: Diagnosis not present

## 2012-08-31 DIAGNOSIS — Z859 Personal history of malignant neoplasm, unspecified: Secondary | ICD-10-CM | POA: Diagnosis not present

## 2012-09-01 DIAGNOSIS — E7800 Pure hypercholesterolemia, unspecified: Secondary | ICD-10-CM | POA: Diagnosis not present

## 2012-09-01 DIAGNOSIS — I7300 Raynaud's syndrome without gangrene: Secondary | ICD-10-CM | POA: Diagnosis not present

## 2012-09-01 DIAGNOSIS — E039 Hypothyroidism, unspecified: Secondary | ICD-10-CM | POA: Diagnosis not present

## 2012-09-01 DIAGNOSIS — Z859 Personal history of malignant neoplasm, unspecified: Secondary | ICD-10-CM | POA: Diagnosis not present

## 2012-09-01 DIAGNOSIS — I739 Peripheral vascular disease, unspecified: Secondary | ICD-10-CM | POA: Diagnosis not present

## 2012-09-01 DIAGNOSIS — M349 Systemic sclerosis, unspecified: Secondary | ICD-10-CM | POA: Diagnosis not present

## 2012-09-16 DIAGNOSIS — M349 Systemic sclerosis, unspecified: Secondary | ICD-10-CM | POA: Diagnosis not present

## 2012-09-16 DIAGNOSIS — I7300 Raynaud's syndrome without gangrene: Secondary | ICD-10-CM | POA: Diagnosis not present

## 2012-09-27 DIAGNOSIS — F325 Major depressive disorder, single episode, in full remission: Secondary | ICD-10-CM | POA: Diagnosis not present

## 2012-10-20 DIAGNOSIS — I7300 Raynaud's syndrome without gangrene: Secondary | ICD-10-CM | POA: Diagnosis not present

## 2012-10-20 DIAGNOSIS — I739 Peripheral vascular disease, unspecified: Secondary | ICD-10-CM | POA: Diagnosis not present

## 2012-10-20 DIAGNOSIS — M349 Systemic sclerosis, unspecified: Secondary | ICD-10-CM | POA: Diagnosis not present

## 2012-10-20 DIAGNOSIS — I999 Unspecified disorder of circulatory system: Secondary | ICD-10-CM | POA: Diagnosis not present

## 2012-11-04 DIAGNOSIS — H2510 Age-related nuclear cataract, unspecified eye: Secondary | ICD-10-CM | POA: Diagnosis not present

## 2012-11-04 DIAGNOSIS — H43819 Vitreous degeneration, unspecified eye: Secondary | ICD-10-CM | POA: Diagnosis not present

## 2012-11-04 DIAGNOSIS — H04129 Dry eye syndrome of unspecified lacrimal gland: Secondary | ICD-10-CM | POA: Diagnosis not present

## 2012-11-10 ENCOUNTER — Other Ambulatory Visit: Payer: Self-pay | Admitting: Nurse Practitioner

## 2012-11-15 DIAGNOSIS — I7300 Raynaud's syndrome without gangrene: Secondary | ICD-10-CM | POA: Diagnosis not present

## 2012-11-15 DIAGNOSIS — M349 Systemic sclerosis, unspecified: Secondary | ICD-10-CM | POA: Diagnosis not present

## 2012-11-15 DIAGNOSIS — M810 Age-related osteoporosis without current pathological fracture: Secondary | ICD-10-CM | POA: Diagnosis not present

## 2012-11-15 DIAGNOSIS — M159 Polyosteoarthritis, unspecified: Secondary | ICD-10-CM | POA: Diagnosis not present

## 2012-11-30 DIAGNOSIS — I739 Peripheral vascular disease, unspecified: Secondary | ICD-10-CM | POA: Diagnosis not present

## 2012-11-30 DIAGNOSIS — M349 Systemic sclerosis, unspecified: Secondary | ICD-10-CM | POA: Diagnosis not present

## 2012-11-30 DIAGNOSIS — I999 Unspecified disorder of circulatory system: Secondary | ICD-10-CM | POA: Diagnosis not present

## 2012-11-30 DIAGNOSIS — E039 Hypothyroidism, unspecified: Secondary | ICD-10-CM | POA: Diagnosis not present

## 2012-11-30 DIAGNOSIS — K219 Gastro-esophageal reflux disease without esophagitis: Secondary | ICD-10-CM | POA: Diagnosis not present

## 2012-11-30 DIAGNOSIS — I7300 Raynaud's syndrome without gangrene: Secondary | ICD-10-CM | POA: Diagnosis not present

## 2012-12-09 DIAGNOSIS — H04129 Dry eye syndrome of unspecified lacrimal gland: Secondary | ICD-10-CM | POA: Diagnosis not present

## 2012-12-09 DIAGNOSIS — H2510 Age-related nuclear cataract, unspecified eye: Secondary | ICD-10-CM | POA: Diagnosis not present

## 2012-12-09 DIAGNOSIS — H43819 Vitreous degeneration, unspecified eye: Secondary | ICD-10-CM | POA: Diagnosis not present

## 2012-12-15 DIAGNOSIS — I739 Peripheral vascular disease, unspecified: Secondary | ICD-10-CM | POA: Diagnosis not present

## 2012-12-15 DIAGNOSIS — I999 Unspecified disorder of circulatory system: Secondary | ICD-10-CM | POA: Diagnosis not present

## 2012-12-15 DIAGNOSIS — I7300 Raynaud's syndrome without gangrene: Secondary | ICD-10-CM | POA: Diagnosis not present

## 2012-12-17 ENCOUNTER — Other Ambulatory Visit: Payer: Self-pay | Admitting: Nurse Practitioner

## 2012-12-27 DIAGNOSIS — F325 Major depressive disorder, single episode, in full remission: Secondary | ICD-10-CM | POA: Diagnosis not present

## 2013-01-19 DIAGNOSIS — I999 Unspecified disorder of circulatory system: Secondary | ICD-10-CM | POA: Diagnosis not present

## 2013-01-19 DIAGNOSIS — I998 Other disorder of circulatory system: Secondary | ICD-10-CM | POA: Diagnosis not present

## 2013-01-19 DIAGNOSIS — M349 Systemic sclerosis, unspecified: Secondary | ICD-10-CM | POA: Diagnosis not present

## 2013-01-19 DIAGNOSIS — I739 Peripheral vascular disease, unspecified: Secondary | ICD-10-CM | POA: Diagnosis not present

## 2013-01-19 DIAGNOSIS — I7300 Raynaud's syndrome without gangrene: Secondary | ICD-10-CM | POA: Diagnosis not present

## 2013-02-15 DIAGNOSIS — M349 Systemic sclerosis, unspecified: Secondary | ICD-10-CM | POA: Diagnosis not present

## 2013-02-15 DIAGNOSIS — M810 Age-related osteoporosis without current pathological fracture: Secondary | ICD-10-CM | POA: Diagnosis not present

## 2013-02-15 DIAGNOSIS — M159 Polyosteoarthritis, unspecified: Secondary | ICD-10-CM | POA: Diagnosis not present

## 2013-02-15 DIAGNOSIS — I7300 Raynaud's syndrome without gangrene: Secondary | ICD-10-CM | POA: Diagnosis not present

## 2013-02-22 ENCOUNTER — Other Ambulatory Visit: Payer: Self-pay

## 2013-02-22 MED ORDER — VITAMIN D (ERGOCALCIFEROL) 1.25 MG (50000 UNIT) PO CAPS: 50000.0000 [IU] | ORAL_CAPSULE | ORAL | Status: DC

## 2013-02-22 NOTE — Telephone Encounter (Signed)
Last Vit D Level 08/17/12  Normal 39

## 2013-03-21 DIAGNOSIS — F339 Major depressive disorder, recurrent, unspecified: Secondary | ICD-10-CM | POA: Diagnosis not present

## 2013-03-29 DIAGNOSIS — H11159 Pinguecula, unspecified eye: Secondary | ICD-10-CM | POA: Diagnosis not present

## 2013-03-29 DIAGNOSIS — H43819 Vitreous degeneration, unspecified eye: Secondary | ICD-10-CM | POA: Diagnosis not present

## 2013-03-29 DIAGNOSIS — H2510 Age-related nuclear cataract, unspecified eye: Secondary | ICD-10-CM | POA: Diagnosis not present

## 2013-03-31 ENCOUNTER — Other Ambulatory Visit: Payer: Self-pay | Admitting: Nurse Practitioner

## 2013-03-31 DIAGNOSIS — E041 Nontoxic single thyroid nodule: Secondary | ICD-10-CM

## 2013-03-31 NOTE — Progress Notes (Signed)
Patient aware.

## 2013-04-06 ENCOUNTER — Other Ambulatory Visit (INDEPENDENT_AMBULATORY_CARE_PROVIDER_SITE_OTHER): Payer: Medicare Other

## 2013-04-06 DIAGNOSIS — Z23 Encounter for immunization: Secondary | ICD-10-CM

## 2013-04-06 DIAGNOSIS — E041 Nontoxic single thyroid nodule: Secondary | ICD-10-CM

## 2013-04-06 DIAGNOSIS — E0530 Thyrotoxicosis from ectopic thyroid tissue without thyrotoxic crisis or storm: Secondary | ICD-10-CM | POA: Diagnosis not present

## 2013-04-06 NOTE — Progress Notes (Signed)
Pt came in for labs only 

## 2013-04-07 ENCOUNTER — Ambulatory Visit
Admission: RE | Admit: 2013-04-07 | Discharge: 2013-04-07 | Disposition: A | Discharge status: Home / Self Care | Payer: Medicare Other | Source: Ambulatory Visit | Attending: Nurse Practitioner | Admitting: Nurse Practitioner

## 2013-04-07 DIAGNOSIS — E041 Nontoxic single thyroid nodule: Secondary | ICD-10-CM | POA: Diagnosis not present

## 2013-04-07 LAB — THYROID PANEL WITH TSH
T3 Uptake Ratio: 24 % (ref 24–39)
T4, Total: 4.6 ug/dL (ref 4.5–12.0)
TSH: 2.85 u[IU]/mL (ref 0.450–4.500)

## 2013-04-19 ENCOUNTER — Telehealth: Payer: Self-pay | Admitting: Nurse Practitioner

## 2013-04-21 ENCOUNTER — Other Ambulatory Visit: Payer: Self-pay | Admitting: Nurse Practitioner

## 2013-04-21 DIAGNOSIS — E041 Nontoxic single thyroid nodule: Secondary | ICD-10-CM

## 2013-04-26 ENCOUNTER — Ambulatory Visit (INDEPENDENT_AMBULATORY_CARE_PROVIDER_SITE_OTHER): Payer: Medicare Other | Admitting: Internal Medicine

## 2013-04-26 ENCOUNTER — Encounter: Payer: Self-pay | Admitting: Internal Medicine

## 2013-04-26 VITALS — BP 134/70 | HR 65 | Temp 98.4°F | Resp 12 | Ht 62.5 in | Wt 131.3 lb

## 2013-04-26 DIAGNOSIS — E041 Nontoxic single thyroid nodule: Secondary | ICD-10-CM | POA: Diagnosis not present

## 2013-04-26 NOTE — Progress Notes (Signed)
Patient ID: Haley Trevino, female   DOB: Aug 15, 1940, 72 y.o.   MRN: [id number]   HPI  Haley Trevino is a 72 y.o.-year-old female, self - referred for management of a thyroid cyst. PCP: Bennie Pierini NP (Dr. Rudi Heap).  Thyroid U/S (04/21/2013): tiny cyst. She had previous thyroid ultrasound performed on 05/16/2011 and 03/10/2012, both showing heterogeneous texture of the thyroid, along with few very small nodules,~ 3 mm in the largest dimension.  Pt denies feeling nodules in neck, hoarseness, dysphagia/odynophagia, SOB with lying down. She can have choking but tells me that this is from her limited scleroderma.   I reviewed pt's thyroid tests: Lab Results  Component Value Date   TSH 2.850 04/06/2013    Pt c/o: - + heat intolerance/+ cold intolerance - tremors - no anxiety/+ depression - + palpitations lately - + fatigue - + diarrhea for many years/no constipation - no weight loss/weight gain - + dry skin  Pt has a FH of thyroid ds in mother and aunt. No FH of thyroid cancer. No h/o radiation tx to head or neck.  No seaweed or kelp, no recent contrast studies. No steroid use. On Synthroid 50.  I reviewed her chart and she also has a history of localized scleroderma - s/p bilat. hand surgery for Raynaud's sd., HTN, HL, hypothyroidism - on Synthroid.  ROS: Constitutional: no weight gain/loss, + fatigue - also see HPI Eyes: no blurry vision, no xerophthalmia ENT: no sore throat, no nodules palpated in throat, +dysphagia/no odynophagia, no hoarseness Cardiovascular: no CP/+SOB/+ palpitations/no leg swelling Respiratory: no cough/+SOB Gastrointestinal: no N/V/D/C Musculoskeletal: + muscle and joint aches Skin: no rashes Neurological: no tremors/numbness/tingling/dizziness Psychiatric: no depression/anxiety Low libido  Past Medical History  Diagnosis Date  . Raynaud disease   . Scleroderma   . Osteoarthritis   . Dyspnea   . Thyroid disease    hypothyroidism   Past Surgical History  Procedure Laterality Date  . Cholecystectomy  1973  . Total abdominal hysterectomy  1974  . Breast enhancement surgery  1975  . Hand surgery  08/2012; 11/2012  . Melanoma excision      at 31 yrs of age   History   Social History  . Marital Status: Married    Spouse Name: N/A    Number of Children: 2   Occupational History  . retired Holiday representative at [employer]    Social History Main Topics  . Smoking status: Never Smoker   . Smokeless tobacco: Never Used     Comment: passive tobacco smoke exposure  . Alcohol Use: No  . Drug Use: No   Social History Narrative   Regular exercise: walking   Caffeine use: 1 cup of coffee daily   Current Outpatient Prescriptions on File Prior to Visit  Medication Sig Dispense Refill  . atorvastatin (LIPITOR) 20 MG tablet TAKE ONE TABLET BY MOUTH EVERY DAY  30 tablet  2  . FLUoxetine (PROZAC) 40 MG capsule Take 20 mg by mouth daily.       Marland Kitchen NIFEdipine (PROCARDIA XL/ADALAT-CC) 30 MG 24 hr tablet Take 90 mg by mouth Daily.       Marland Kitchen omeprazole (PRILOSEC) 20 MG capsule Take 20 mg by mouth daily.        Marland Kitchen SYNTHROID 50 MCG tablet TAKE ONE TABLET BY MOUTH EVERY DAY  30 tablet  5  . Vitamin D, Ergocalciferol, (DRISDOL) 50000 UNITS CAPS capsule Take 1 capsule (50,000 Units total) by mouth every 7 (seven) days.  4 capsule  2  . famotidine (PEPCID) 20 MG tablet One at bedtime  30 tablet  11  . omeprazole (PRILOSEC) 20 MG capsule Take 30-60 min before first meal of the day  30 capsule  11  . Vitamin B1-B12 5.5-0.0125 MG/5ML SOLN Take by mouth every 30 (thirty) days.         No current facility-administered medications on file prior to visit.   Allergies  Allergen Reactions  . Codeine     REACTION: dyspnea   Family History  Problem Relation Age of Onset  . Pancreatic cancer Father   . Rashes / Skin problems Daughter     scleroderma   PE: BP 134/70  Pulse 65  Temp(Src) 98.4 F (36.9 C) (Oral)  Resp 12  Ht  5' 2.5" (1.588 m)  Wt 131 lb 4.8 oz (59.557 kg)  BMI 23.62 kg/m2  SpO2 99% Wt Readings from Last 3 Encounters:  04/26/13 131 lb 4.8 oz (59.557 kg)  10/06/11 133 lb (60.328 kg)  05/13/11 125 lb (56.7 kg)   Constitutional: overweight, in NAD Eyes: PERRLA, EOMI, no exophthalmos ENT: moist mucous membranes, no thyromegaly, no cervical lymphadenopathy Cardiovascular: RRR, No MRG Respiratory: CTA B Gastrointestinal: abdomen soft, NT, ND, BS+ Musculoskeletal: no deformities, strength intact in all 4;  Skin: moist, warm, no rashes Neurological: no tremor with outstretched hands, DTR normal in all 4  ASSESSMENT: 1. Thyroid cyst - thyroid U/S (04/21/2013): Comparison: Ultrasound thyroid 03/10/2012   Right thyroid lobe: Right thyroid lobe measures 3.5 x 1.1 x 1.6 cm.   Left thyroid lobe: The left thyroid lobe measures 3.3 x 0.7 x 1.0 cm.   Isthmus: The isthmus measures 0.15 cm.   Focal nodules: There is a 5 x 4 x 4 mm cystic nodule within the left thyroid lobe potentially representing two adjacent smaller  cysts.   Lymphadenopathy: None visualized.   PLAN: - I reviewed the images of her thyroid ultrasound along with the patient. I pointed out that she has a very small cyst, with septate appearance, which does not appear to be at any particular risk for cancer. These cysts are very common and typically do not pose any problems until they enlarge and press on adjacent structures. I do not recommend biopsy due to the subcm size and the cystic nature of the lesion - also, I would not recommend further ultrasounds unless the cyst enlarges and causes neck compression sxs - she should let me know if she develops neck compression symptoms, in that case, we will need a new U/S and possible aspiration - I will see her back on a prn basis if the cyst enlarges

## 2013-04-26 NOTE — Patient Instructions (Signed)
You have a tiny thyroid cyst. No need for a new thyroid ultrasound unless you have problems swallowing, breathing, you develop hoarseness or you feel a nodule or see a nodule in the mirror.

## 2013-05-24 ENCOUNTER — Other Ambulatory Visit: Payer: Self-pay | Admitting: *Deleted

## 2013-05-24 MED ORDER — VITAMIN D (ERGOCALCIFEROL) 1.25 MG (50000 UNIT) PO CAPS: 50000.0000 [IU] | ORAL_CAPSULE | ORAL | Status: DC

## 2013-05-30 ENCOUNTER — Telehealth: Payer: Self-pay | Admitting: Nurse Practitioner

## 2013-05-30 ENCOUNTER — Encounter: Payer: Self-pay | Admitting: Nurse Practitioner

## 2013-05-30 ENCOUNTER — Encounter (INDEPENDENT_AMBULATORY_CARE_PROVIDER_SITE_OTHER): Payer: Self-pay

## 2013-05-30 ENCOUNTER — Ambulatory Visit (INDEPENDENT_AMBULATORY_CARE_PROVIDER_SITE_OTHER): Payer: Medicare Other | Admitting: Nurse Practitioner

## 2013-05-30 VITALS — BP 130/61 | HR 78 | Temp 97.8°F | Ht 62.5 in | Wt 131.0 lb

## 2013-05-30 DIAGNOSIS — E538 Deficiency of other specified B group vitamins: Secondary | ICD-10-CM | POA: Diagnosis not present

## 2013-05-30 DIAGNOSIS — I7300 Raynaud's syndrome without gangrene: Secondary | ICD-10-CM | POA: Diagnosis not present

## 2013-05-30 DIAGNOSIS — M1990 Unspecified osteoarthritis, unspecified site: Secondary | ICD-10-CM | POA: Diagnosis not present

## 2013-05-30 DIAGNOSIS — E785 Hyperlipidemia, unspecified: Secondary | ICD-10-CM | POA: Diagnosis not present

## 2013-05-30 DIAGNOSIS — E559 Vitamin D deficiency, unspecified: Secondary | ICD-10-CM | POA: Diagnosis not present

## 2013-05-30 DIAGNOSIS — M349 Systemic sclerosis, unspecified: Secondary | ICD-10-CM | POA: Diagnosis not present

## 2013-05-30 MED ORDER — OMEPRAZOLE 20 MG PO CPDR: 20.0000 mg | DELAYED_RELEASE_CAPSULE | Freq: Every day | ORAL | Status: DC

## 2013-05-30 MED ORDER — SYNTHROID 50 MCG PO TABS: 50.0000 ug | ORAL_TABLET | Freq: Every day | ORAL | Status: DC

## 2013-05-30 MED ORDER — NIFEDIPINE ER OSMOTIC RELEASE 30 MG PO TB24: 90.0000 mg | ORAL_TABLET | Freq: Every day | ORAL | Status: DC

## 2013-05-30 MED ORDER — FLUOXETINE HCL 40 MG PO CAPS: 40.0000 mg | ORAL_CAPSULE | Freq: Every day | ORAL | Status: DC

## 2013-05-30 NOTE — Patient Instructions (Signed)

## 2013-05-30 NOTE — Telephone Encounter (Signed)
rx sent for 90 days- prozac she will have to break in half because 40mg  sent in

## 2013-05-30 NOTE — Progress Notes (Signed)
  Subjective:    Patient ID: Haley Trevino, female    DOB: 1940-11-07, 72 y.o.   MRN: [id number]  HPI Patient here today for follow up of chronic medical problems. Patient says that she is not good- weakness in arms and legs and swelling in legs by end of day. Her raynauds is starting to act up- sees dr. Anson [place] next week and he is going to try new med. Patient has had surgery on hands to increase circulation to fingers. Hasn't  Really helped that much.    Review of Systems  HENT: Negative.   Respiratory: Negative.   Cardiovascular: Negative.   Gastrointestinal: Negative.   Genitourinary: Negative.   Musculoskeletal: Positive for arthralgias.  Allergic/Immunologic: Negative.   Neurological: Positive for weakness.  Hematological: Negative.   Psychiatric/Behavioral: Negative.        Objective:   Physical Exam  Constitutional: She is oriented to person, place, and time. She appears well-developed and well-nourished.  HENT:  Nose: Nose normal.  Mouth/Throat: Oropharynx is clear and moist.  Eyes: EOM are normal.  Neck: Trachea normal, normal range of motion and full passive range of motion without pain. Neck supple. No JVD present. Carotid bruit is not present. No thyromegaly present.  Cardiovascular: Normal rate, regular rhythm, normal heart sounds and intact distal pulses.  Exam reveals no gallop and no friction rub.   No murmur heard. Pulmonary/Chest: Effort normal and breath sounds normal.  Abdominal: Soft. Bowel sounds are normal. She exhibits no distension and no mass. There is no tenderness.  Musculoskeletal: Normal range of motion.  Lymphadenopathy:    She has no cervical adenopathy.  Neurological: She is alert and oriented to person, place, and time. She has normal reflexes.  Skin: Skin is warm and dry.  Patient has hard crusted areas on tips of all fingers  Psychiatric: She has a normal mood and affect. Her behavior is normal. Judgment and thought content normal.      BP 130/61  Pulse 78  Temp(Src) 97.8 F (36.6 C) (Oral)  Ht 5' 2.5" (1.588 m)  Wt 131 lb (59.421 kg)  BMI 23.56 kg/m2      Assessment & Plan:   1. [name]   2. SCLERODERMA   3. OSTEOARTHRITIS   4. Hyperlipidemia LDL goal < 100    Orders Placed This Encounter  Procedures  . CMP14+EGFR  . NMR, lipoprofile   Meds ordered this encounter  Medications  . NIFEdipine (PROCARDIA-XL/ADALAT-CC/NIFEDICAL-XL) 30 MG 24 hr tablet    Sig: Take 3 tablets (90 mg total) by mouth daily.    Dispense:  30 tablet    Refill:  5    Order Specific Question:  Supervising Provider    Answer:  Ernestina Penna [[id number]]  . omeprazole (PRILOSEC) 20 MG capsule    Sig: Take 1 capsule (20 mg total) by mouth daily.    Dispense:  30 capsule    Refill:  5    Order Specific Question:  Supervising Provider    Answer:  Ernestina Penna [[id number]]  . SYNTHROID 50 MCG tablet    Sig: Take 1 tablet (50 mcg total) by mouth daily before breakfast.    Dispense:  30 tablet    Refill:  5    Order Specific Question:  Supervising Provider    Answer:  Deborra Medina    Continue all meds Labs pending Diet and exercise encouraged Health maintenance reviewed Follow up in 6 months  Mary-Margaret Daphine Deutscher, FNP

## 2013-05-31 LAB — NMR, LIPOPROFILE
Cholesterol: 208 mg/dL — ABNORMAL HIGH (ref <200)
HDL Particle Number: 32.2 umol/L (ref >=30.5)
LDL Size: 21 nm (ref >20.5)
LDLC SERPL CALC-MCNC: 126 mg/dL — ABNORMAL HIGH (ref <100)
LP-IR Score: 49 — ABNORMAL HIGH (ref <=45)
Small LDL Particle Number: 934 nmol/L — ABNORMAL HIGH (ref <=527)
Triglycerides by NMR: 174 mg/dL — ABNORMAL HIGH (ref <150)

## 2013-05-31 LAB — CMP14+EGFR
AST: 19 IU/L (ref 0–40)
Albumin/Globulin Ratio: 1.8 (ref 1.1–2.5)
Albumin: 4.4 g/dL (ref 3.5–4.8)
Alkaline Phosphatase: 57 IU/L (ref 39–117)
BUN/Creatinine Ratio: 19 (ref 11–26)
BUN: 14 mg/dL (ref 8–27)
CO2: 23 mmol/L (ref 18–29)
Creatinine, Ser: 0.75 mg/dL (ref 0.57–1.00)
GFR calc Af Amer: 93 mL/min/{1.73_m2} (ref >59)
Globulin, Total: 2.4 g/dL (ref 1.5–4.5)
Sodium: 141 mmol/L (ref 134–144)
Total Bilirubin: <0.2 mg/dL (ref 0.0–1.2)
Total Protein: 6.8 g/dL (ref 6.0–8.5)

## 2013-05-31 LAB — VITAMIN D 25 HYDROXY (VIT D DEFICIENCY, FRACTURES): Vit D, 25-Hydroxy: 30.7 ng/mL (ref 30.0–100.0)

## 2013-06-01 ENCOUNTER — Other Ambulatory Visit: Payer: Self-pay | Admitting: Nurse Practitioner

## 2013-06-01 MED ORDER — COLESEVELAM HCL 625 MG PO TABS: 1875.0000 mg | ORAL_TABLET | Freq: Two times a day (BID) | ORAL | Status: DC

## 2013-06-06 ENCOUNTER — Telehealth: Payer: Self-pay | Admitting: Nurse Practitioner

## 2013-06-06 NOTE — Telephone Encounter (Signed)
Can break in half if not capsule and use

## 2013-06-06 NOTE — Telephone Encounter (Signed)
Does not take 40 mg of prozac and she is only on 20 and she gets it at [facility] now she has 3 mths from [place] she dont know what to do

## 2013-06-06 NOTE — Telephone Encounter (Signed)
Patient aware cant break in half

## 2013-06-07 DIAGNOSIS — M159 Polyosteoarthritis, unspecified: Secondary | ICD-10-CM | POA: Diagnosis not present

## 2013-06-07 DIAGNOSIS — M349 Systemic sclerosis, unspecified: Secondary | ICD-10-CM | POA: Diagnosis not present

## 2013-06-07 DIAGNOSIS — M810 Age-related osteoporosis without current pathological fracture: Secondary | ICD-10-CM | POA: Diagnosis not present

## 2013-06-07 DIAGNOSIS — I7300 Raynaud's syndrome without gangrene: Secondary | ICD-10-CM | POA: Diagnosis not present

## 2013-06-20 ENCOUNTER — Other Ambulatory Visit: Payer: Self-pay | Admitting: Nurse Practitioner

## 2013-06-22 DIAGNOSIS — K449 Diaphragmatic hernia without obstruction or gangrene: Secondary | ICD-10-CM | POA: Diagnosis not present

## 2013-06-22 DIAGNOSIS — Z09 Encounter for follow-up examination after completed treatment for conditions other than malignant neoplasm: Secondary | ICD-10-CM | POA: Diagnosis not present

## 2013-06-22 DIAGNOSIS — R1310 Dysphagia, unspecified: Secondary | ICD-10-CM | POA: Diagnosis not present

## 2013-06-22 DIAGNOSIS — D126 Benign neoplasm of colon, unspecified: Secondary | ICD-10-CM | POA: Diagnosis not present

## 2013-06-22 DIAGNOSIS — Z86010 Personal history of colonic polyps: Secondary | ICD-10-CM | POA: Diagnosis not present

## 2013-06-22 DIAGNOSIS — K2940 Chronic atrophic gastritis without bleeding: Secondary | ICD-10-CM | POA: Diagnosis not present

## 2013-06-22 DIAGNOSIS — K219 Gastro-esophageal reflux disease without esophagitis: Secondary | ICD-10-CM | POA: Diagnosis not present

## 2013-06-22 NOTE — Telephone Encounter (Signed)
Last seen and last Vit D level 05/30/13  MMM  Normal 30.7

## 2013-07-11 DIAGNOSIS — F339 Major depressive disorder, recurrent, unspecified: Secondary | ICD-10-CM | POA: Diagnosis not present

## 2013-07-20 DIAGNOSIS — I7300 Raynaud's syndrome without gangrene: Secondary | ICD-10-CM | POA: Diagnosis not present

## 2013-07-20 DIAGNOSIS — I739 Peripheral vascular disease, unspecified: Secondary | ICD-10-CM | POA: Diagnosis not present

## 2013-07-20 DIAGNOSIS — I999 Unspecified disorder of circulatory system: Secondary | ICD-10-CM | POA: Diagnosis not present

## 2013-08-02 ENCOUNTER — Encounter: Payer: Self-pay | Admitting: Family Medicine

## 2013-08-02 ENCOUNTER — Ambulatory Visit (INDEPENDENT_AMBULATORY_CARE_PROVIDER_SITE_OTHER): Payer: Medicare Other | Admitting: Family Medicine

## 2013-08-02 VITALS — BP 141/72 | HR 97 | Temp 99.4°F | Ht 62.5 in | Wt 131.0 lb

## 2013-08-02 DIAGNOSIS — J069 Acute upper respiratory infection, unspecified: Secondary | ICD-10-CM | POA: Diagnosis not present

## 2013-08-02 DIAGNOSIS — R059 Cough, unspecified: Secondary | ICD-10-CM

## 2013-08-02 DIAGNOSIS — R05 Cough: Secondary | ICD-10-CM

## 2013-08-02 LAB — POCT INFLUENZA A/B
Influenza A, POC: NEGATIVE
Influenza B, POC: NEGATIVE

## 2013-08-02 MED ORDER — AZITHROMYCIN 250 MG PO TABS: ORAL_TABLET | ORAL | Status: DC

## 2013-08-02 NOTE — Progress Notes (Signed)
   Subjective:    Patient ID: Haley Trevino, female    DOB: 11-01-1940, 73 y.o.   MRN: [id number]  HPI  This 73 y.o. female presents for evaluation of uri sx's for over one day.  Review of Systems    No chest pain, SOB, HA, dizziness, vision change, N/V, diarrhea, constipation, dysuria, urinary urgency or frequency, myalgias, arthralgias or rash.  Objective:   Physical Exam  Vital signs noted  Well developed well nourished female.  HEENT - Head atraumatic Normocephalic                Eyes - PERRLA, Conjuctiva - clear Sclera- Clear EOMI                Ears - EAC's Wnl TM's Wnl Gross Hearing WNL                Nose - Nares patent                 Throat - oropharanx wnl Respiratory - Lungs CTA bilateral Cardiac - RRR S1 and S2 without murmur GI - Abdomen soft Nontender and bowel sounds active x 4 Extremities - No edema. Neuro - Grossly intact.  Results for orders placed in visit on 08/02/13  POCT INFLUENZA A/B      Result Value Range   Influenza A, POC Negative     Influenza B, POC Negative        Assessment & Plan:  Cough - Plan: POCT Influenza A/B, azithromycin (ZITHROMAX) 250 MG tablet, azithromycin (ZITHROMAX) 250 MG tablet  URI (upper respiratory infection) - Plan: azithromycin (ZITHROMAX) 250 MG tablet, azithromycin (ZITHROMAX) 250 MG tablet  Push po fluids, rest, tylenol and motrin otc prn as directed for fever, arthralgias, and myalgias.  Follow up prn if sx's continue or persist.  Lysbeth Penner FNP

## 2013-09-07 DIAGNOSIS — F339 Major depressive disorder, recurrent, unspecified: Secondary | ICD-10-CM | POA: Diagnosis not present

## 2013-09-08 DIAGNOSIS — M810 Age-related osteoporosis without current pathological fracture: Secondary | ICD-10-CM | POA: Diagnosis not present

## 2013-09-08 DIAGNOSIS — I7300 Raynaud's syndrome without gangrene: Secondary | ICD-10-CM | POA: Diagnosis not present

## 2013-09-08 DIAGNOSIS — M349 Systemic sclerosis, unspecified: Secondary | ICD-10-CM | POA: Diagnosis not present

## 2013-09-08 DIAGNOSIS — M159 Polyosteoarthritis, unspecified: Secondary | ICD-10-CM | POA: Diagnosis not present

## 2013-11-08 DIAGNOSIS — M810 Age-related osteoporosis without current pathological fracture: Secondary | ICD-10-CM | POA: Diagnosis not present

## 2013-11-08 DIAGNOSIS — I7300 Raynaud's syndrome without gangrene: Secondary | ICD-10-CM | POA: Diagnosis not present

## 2013-11-08 DIAGNOSIS — M159 Polyosteoarthritis, unspecified: Secondary | ICD-10-CM | POA: Diagnosis not present

## 2013-11-08 DIAGNOSIS — M349 Systemic sclerosis, unspecified: Secondary | ICD-10-CM | POA: Diagnosis not present

## 2013-11-14 ENCOUNTER — Other Ambulatory Visit (HOSPITAL_COMMUNITY): Payer: Self-pay

## 2013-11-14 DIAGNOSIS — M349 Systemic sclerosis, unspecified: Secondary | ICD-10-CM

## 2013-11-18 ENCOUNTER — Ambulatory Visit (HOSPITAL_COMMUNITY)
Admission: RE | Admit: 2013-11-18 | Discharge: 2013-11-18 | Disposition: A | Discharge status: Home / Self Care | Payer: Medicare Other | Source: Ambulatory Visit | Attending: Internal Medicine | Admitting: Internal Medicine

## 2013-11-18 DIAGNOSIS — R059 Cough, unspecified: Secondary | ICD-10-CM | POA: Insufficient documentation

## 2013-11-18 DIAGNOSIS — R05 Cough: Secondary | ICD-10-CM | POA: Diagnosis not present

## 2013-11-18 DIAGNOSIS — M349 Systemic sclerosis, unspecified: Secondary | ICD-10-CM | POA: Diagnosis not present

## 2013-11-18 MED ORDER — ALBUTEROL SULFATE (2.5 MG/3ML) 0.083% IN NEBU
2.5000 mg | INHALATION_SOLUTION | Freq: Once | RESPIRATORY_TRACT | Status: AC
  Administered 2013-11-18: 2.5 mg via RESPIRATORY_TRACT

## 2013-11-24 LAB — PULMONARY FUNCTION TEST
DL/VA % PRED: 80 %
DL/VA: 3.65 ml/min/mmHg/L
DLCO COR: 16.05 ml/min/mmHg
DLCO cor % pred: 74 %
DLCO unc % pred: 74 %
DLCO unc: 16.05 ml/min/mmHg
FEF 25-75 PRE: 1.87 L/s
FEF 25-75 Post: 2.31 L/sec
FEF2575-%Change-Post: 23 %
FEF2575-%PRED-POST: 137 %
FEF2575-%Pred-Pre: 111 %
FEV1-%Change-Post: 4 %
FEV1-%PRED-POST: 103 %
FEV1-%Pred-Pre: 98 %
FEV1-Post: 2.08 L
FEV1-Pre: 1.99 L
FEV1FVC-%Change-Post: -1 %
FEV1FVC-%Pred-Pre: 106 %
FEV6-%CHANGE-POST: 6 %
FEV6-%PRED-PRE: 98 %
FEV6-%Pred-Post: 104 %
FEV6-Post: 2.65 L
FEV6-Pre: 2.5 L
FEV6FVC-%PRED-POST: 105 %
FEV6FVC-%Pred-Pre: 105 %
FVC-%CHANGE-POST: 6 %
FVC-%PRED-POST: 99 %
FVC-%PRED-PRE: 93 %
FVC-Post: 2.65 L
FVC-Pre: 2.5 L
PRE FEV1/FVC RATIO: 80 %
PRE FEV6/FVC RATIO: 100 %
Post FEV1/FVC ratio: 78 %
Post FEV6/FVC ratio: 100 %
RV % pred: 95 %
RV: 2.05 L
TLC % pred: 103 %
TLC: 4.89 L

## 2013-11-29 ENCOUNTER — Ambulatory Visit (INDEPENDENT_AMBULATORY_CARE_PROVIDER_SITE_OTHER): Payer: Medicare Other | Admitting: Nurse Practitioner

## 2013-11-29 ENCOUNTER — Encounter: Payer: Self-pay | Admitting: Nurse Practitioner

## 2013-11-29 VITALS — BP 142/66 | HR 72 | Temp 98.7°F | Ht 62.0 in | Wt 125.0 lb

## 2013-11-29 DIAGNOSIS — K219 Gastro-esophageal reflux disease without esophagitis: Secondary | ICD-10-CM

## 2013-11-29 DIAGNOSIS — M349 Systemic sclerosis, unspecified: Secondary | ICD-10-CM | POA: Diagnosis not present

## 2013-11-29 DIAGNOSIS — I7300 Raynaud's syndrome without gangrene: Secondary | ICD-10-CM | POA: Diagnosis not present

## 2013-11-29 DIAGNOSIS — F329 Major depressive disorder, single episode, unspecified: Secondary | ICD-10-CM | POA: Diagnosis not present

## 2013-11-29 DIAGNOSIS — E039 Hypothyroidism, unspecified: Secondary | ICD-10-CM | POA: Diagnosis not present

## 2013-11-29 DIAGNOSIS — E785 Hyperlipidemia, unspecified: Secondary | ICD-10-CM | POA: Diagnosis not present

## 2013-11-29 DIAGNOSIS — F3289 Other specified depressive episodes: Secondary | ICD-10-CM

## 2013-11-29 DIAGNOSIS — F32A Depression, unspecified: Secondary | ICD-10-CM

## 2013-11-29 DIAGNOSIS — I10 Essential (primary) hypertension: Secondary | ICD-10-CM | POA: Insufficient documentation

## 2013-11-29 LAB — POCT CBC
GRANULOCYTE PERCENT: 73.4 % (ref 37–80)
HEMATOCRIT: 38.5 % (ref 37.7–47.9)
Hemoglobin: 12.1 g/dL — AB (ref 12.2–16.2)
Lymph, poc: 2.2 (ref 0.6–3.4)
MCH, POC: 26.2 pg — AB (ref 27–31.2)
MCHC: 31.4 g/dL — AB (ref 31.8–35.4)
MCV: 83.6 fL (ref 80–97)
MPV: 7.5 fL (ref 0–99.8)
POC GRANULOCYTE: 6.4 (ref 2–6.9)
POC LYMPH PERCENT: 25.5 %L (ref 10–50)
Platelet Count, POC: 273 10*3/uL (ref 142–424)
RBC: 4.6 M/uL (ref 4.04–5.48)
RDW, POC: 14.7 %
WBC: 8.7 10*3/uL (ref 4.6–10.2)

## 2013-11-29 MED ORDER — FLUCONAZOLE 150 MG PO TABS: ORAL_TABLET | ORAL | Status: DC

## 2013-11-29 NOTE — Patient Instructions (Signed)

## 2013-11-29 NOTE — Progress Notes (Signed)
   Subjective:    Patient ID: Haley Trevino, female    DOB: October 13, 1940, 73 y.o.   MRN: [id number]  HPI  Patient here today for follow up of chronic medical problems: Sclerderma Getting worse- is being referred to [facility]- starting to get ulcers on fingers that get infected. Currently on doxy for finger infections- doesn't know how long she will be taking this. Raynauds Still has bad nerve pain and hurts to just touch anything. Has started on lyrica to help with nerve pain- could not tolerate gabapentin because it caused swelling. Has minimal swelling for lyrica but not like she had with gabapentin Hypothyroidism SYnthroid 33mcg and doing well with that. Has fatigue but she thinks that is from everything nit just thyroid problems. Depression Decided to take a break from it because she has been on it so long- Sees psychiatrist next tomorrow and will probably start back on  It tomorrow. Hyperlipidemia-  Welchol is the only thing she can tolerate- statins make her hurt Hypertension On procardia which helps greatly with blood pressure and raynauds. Gerd Omeprazole works well to keep symptoms under control.  * vaginal itching from taking antibiotic.  Review of Systems  Constitutional: Negative.   HENT: Negative.   Genitourinary: Negative.   All other systems reviewed and are negative.      Objective:   Physical Exam  Constitutional: She is oriented to person, place, and time. She appears well-developed and well-nourished.  HENT:  Right Ear: External ear normal.  Left Ear: External ear normal.  Nose: Nose normal.  Mouth/Throat: Oropharynx is clear and moist.  Cardiovascular: Normal rate, regular rhythm and normal heart sounds.   Pulmonary/Chest: Effort normal and breath sounds normal.  Abdominal: Soft. Bowel sounds are normal.  Neurological: She is alert and oriented to person, place, and time.  Stiffening of skin on hands and feet. Multiple lesion on tips of fingers index and  middle on both sides.  Skin: Skin is warm and dry.  Psychiatric: She has a normal mood and affect. Her behavior is normal. Judgment and thought content normal.    BP 142/66  Pulse 72  Temp(Src) 98.7 F (37.1 C) (Oral)  Ht $R'5\' 2"'OV$  (1.575 m)  Wt 125 lb (56.7 kg)  BMI 22.86 kg/m2       Assessment & Plan:   1. SCLERODERMA   2. [name]   3. Hypothyroidism   4. Depression   5. GERD (gastroesophageal reflux disease)   6. Hypertension   7. Hyperlipidemia LDL goal < 100    Orders Placed This Encounter  Procedures  . CMP14+EGFR  . NMR, lipoprofile  . Thyroid Panel With TSH  . POCT CBC   Meds ordered this encounter  Medications  . pregabalin (LYRICA) 50 MG capsule    Sig: Take 50 mg by mouth daily.  . fluconazole (DIFLUCAN) 150 MG tablet    Sig: 1 po Q week    Dispense:  4 tablet    Refill:  0    Order Specific Question:  Supervising Provider    Answer:  Chipper Herb [[id number]]   Keep appointments with specialists Labs pending Health maintenance reviewed Diet and exercise encouraged Continue all meds Follow up  In 3 months   Craig, FNP

## 2013-11-30 DIAGNOSIS — F339 Major depressive disorder, recurrent, unspecified: Secondary | ICD-10-CM | POA: Diagnosis not present

## 2013-11-30 LAB — CMP14+EGFR
A/G RATIO: 1.6 (ref 1.1–2.5)
ALBUMIN: 4.3 g/dL (ref 3.5–4.8)
ALT: 11 IU/L (ref 0–32)
AST: 13 IU/L (ref 0–40)
Alkaline Phosphatase: 67 IU/L (ref 39–117)
BUN/Creatinine Ratio: 21 (ref 11–26)
BUN: 14 mg/dL (ref 8–27)
CO2: 21 mmol/L (ref 18–29)
Calcium: 9.1 mg/dL (ref 8.7–10.3)
Chloride: 104 mmol/L (ref 97–108)
Creatinine, Ser: 0.66 mg/dL (ref 0.57–1.00)
GFR calc Af Amer: 102 mL/min/{1.73_m2} (ref >59)
GFR, EST NON AFRICAN AMERICAN: 89 mL/min/{1.73_m2} (ref >59)
GLUCOSE: 77 mg/dL (ref 65–99)
Globulin, Total: 2.7 g/dL (ref 1.5–4.5)
Potassium: 3.8 mmol/L (ref 3.5–5.2)
Sodium: 141 mmol/L (ref 134–144)
TOTAL PROTEIN: 7 g/dL (ref 6.0–8.5)

## 2013-11-30 LAB — NMR, LIPOPROFILE
Cholesterol: 187 mg/dL (ref 100–199)
HDL CHOLESTEROL BY NMR: 51 mg/dL (ref >39)
HDL Particle Number: 33.8 umol/L (ref >=30.5)
LDL Particle Number: 1101 nmol/L — ABNORMAL HIGH (ref <1000)
LDL SIZE: 20.5 nm (ref >20.5)
LDLC SERPL CALC-MCNC: 92 mg/dL (ref 0–99)
LP-IR Score: 64 — ABNORMAL HIGH (ref <=45)
SMALL LDL PARTICLE NUMBER: 647 nmol/L — AB (ref <=527)
TRIGLYCERIDES BY NMR: 222 mg/dL — AB (ref 0–149)

## 2013-11-30 LAB — THYROID PANEL WITH TSH
Free Thyroxine Index: 1.8 (ref 1.2–4.9)
T3 Uptake Ratio: 25 % (ref 24–39)
T4, Total: 7.1 ug/dL (ref 4.5–12.0)
TSH: 3.91 u[IU]/mL (ref 0.450–4.500)

## 2013-12-19 ENCOUNTER — Other Ambulatory Visit: Payer: Self-pay | Admitting: Nurse Practitioner

## 2013-12-21 ENCOUNTER — Other Ambulatory Visit: Payer: Self-pay | Admitting: *Deleted

## 2013-12-21 MED ORDER — SYNTHROID 50 MCG PO TABS: 50.0000 ug | ORAL_TABLET | Freq: Every day | ORAL | Status: DC

## 2014-01-09 DIAGNOSIS — M810 Age-related osteoporosis without current pathological fracture: Secondary | ICD-10-CM | POA: Diagnosis not present

## 2014-01-09 DIAGNOSIS — I7300 Raynaud's syndrome without gangrene: Secondary | ICD-10-CM | POA: Diagnosis not present

## 2014-01-09 DIAGNOSIS — IMO0001 Reserved for inherently not codable concepts without codable children: Secondary | ICD-10-CM | POA: Diagnosis not present

## 2014-01-09 DIAGNOSIS — M349 Systemic sclerosis, unspecified: Secondary | ICD-10-CM | POA: Diagnosis not present

## 2014-01-09 DIAGNOSIS — M159 Polyosteoarthritis, unspecified: Secondary | ICD-10-CM | POA: Diagnosis not present

## 2014-01-11 DIAGNOSIS — Z09 Encounter for follow-up examination after completed treatment for conditions other than malignant neoplasm: Secondary | ICD-10-CM | POA: Diagnosis not present

## 2014-01-11 DIAGNOSIS — M349 Systemic sclerosis, unspecified: Secondary | ICD-10-CM | POA: Diagnosis not present

## 2014-01-11 DIAGNOSIS — I998 Other disorder of circulatory system: Secondary | ICD-10-CM | POA: Diagnosis not present

## 2014-01-11 DIAGNOSIS — I7300 Raynaud's syndrome without gangrene: Secondary | ICD-10-CM | POA: Diagnosis not present

## 2014-01-11 DIAGNOSIS — I999 Unspecified disorder of circulatory system: Secondary | ICD-10-CM | POA: Diagnosis not present

## 2014-01-23 ENCOUNTER — Encounter: Payer: Self-pay | Admitting: Family Medicine

## 2014-01-23 ENCOUNTER — Ambulatory Visit (INDEPENDENT_AMBULATORY_CARE_PROVIDER_SITE_OTHER): Payer: Medicare Other | Admitting: Family Medicine

## 2014-01-23 VITALS — BP 125/72 | HR 87 | Temp 99.1°F | Ht 62.0 in | Wt 122.2 lb

## 2014-01-23 DIAGNOSIS — R21 Rash and other nonspecific skin eruption: Secondary | ICD-10-CM

## 2014-01-23 MED ORDER — METHYLPREDNISOLONE ACETATE 80 MG/ML IJ SUSP
80.0000 mg | Freq: Once | INTRAMUSCULAR | Status: AC
  Administered 2014-01-23: 80 mg via INTRAMUSCULAR

## 2014-01-23 MED ORDER — HYDROXYZINE HCL 25 MG PO TABS: 25.0000 mg | ORAL_TABLET | Freq: Three times a day (TID) | ORAL | Status: DC | PRN

## 2014-01-23 NOTE — Progress Notes (Signed)
   Subjective:    Patient ID: Haley Trevino, female    DOB: Aug 05, 1940, 73 y.o.   MRN: [id number]  HPI This 73 y.o. female presents for evaluation of rash on her lower extremities. She is not sure what she is reacting to. She did watch her daughters dogs for her Last week.   Review of Systems C/o rash   No chest pain, SOB, HA, dizziness, vision change, N/V, diarrhea, constipation, dysuria, urinary urgency or frequency, myalgias, arthralgias.  Objective:   Physical Exam Vital signs noted  Well developed well nourished female.  HEENT - Head atraumatic Normocephalic                Eyes - PERRLA, Conjuctiva - clear Sclera- Clear EOMI                Ears - EAC's Wnl TM's Wnl Gross Hearing WNL                Throat - oropharanx wnl Respiratory - Lungs CTA bilateral Cardiac - RRR S1 and S2 without murmur GI - Abdomen soft Nontender and bowel sounds active x 4 Skin - Erythematous papules on feet and ankles      Assessment & Plan:  Rash and nonspecific skin eruption - Plan: methylPREDNISolone acetate (DEPO-MEDROL) injection 80 mg, hydrOXYzine (ATARAX/VISTARIL) 25 MG tablet  Use calamine lotion otc  Lysbeth Penner FNP

## 2014-01-24 ENCOUNTER — Telehealth: Payer: Self-pay | Admitting: Family Medicine

## 2014-01-24 MED ORDER — TRIAMCINOLONE ACETONIDE 0.5 % EX OINT: 1.0000 "application " | TOPICAL_OINTMENT | Freq: Two times a day (BID) | CUTANEOUS | Status: DC

## 2014-01-24 NOTE — Telephone Encounter (Signed)
Please review for bill who is out

## 2014-01-25 ENCOUNTER — Encounter: Payer: Self-pay | Admitting: Nurse Practitioner

## 2014-01-25 ENCOUNTER — Ambulatory Visit (INDEPENDENT_AMBULATORY_CARE_PROVIDER_SITE_OTHER): Payer: Medicare Other | Admitting: Nurse Practitioner

## 2014-01-25 VITALS — BP 150/80 | HR 76 | Temp 98.3°F | Ht 62.0 in | Wt 121.6 lb

## 2014-01-25 DIAGNOSIS — F410 Panic disorder [episodic paroxysmal anxiety] without agoraphobia: Secondary | ICD-10-CM

## 2014-01-25 DIAGNOSIS — R6889 Other general symptoms and signs: Secondary | ICD-10-CM | POA: Diagnosis not present

## 2014-01-25 DIAGNOSIS — F411 Generalized anxiety disorder: Secondary | ICD-10-CM | POA: Diagnosis not present

## 2014-01-25 MED ORDER — CLONAZEPAM 0.5 MG PO TABS: 0.5000 mg | ORAL_TABLET | Freq: Two times a day (BID) | ORAL | Status: DC | PRN

## 2014-01-25 NOTE — Patient Instructions (Signed)
Stress and Stress Management Stress is a normal reaction to life events. It is what you feel when life demands more than you are used to or more than you can handle. Some stress can be useful. For example, the stress reaction can help you catch the last bus of the day, study for a test, or meet a deadline at work. But stress that occurs too often or for too long can cause problems. It can affect your emotional health and interfere with relationships and normal daily activities. Too much stress can weaken your immune system and increase your risk for physical illness. If you already have a medical problem, stress can make it worse. CAUSES  All sorts of life events may cause stress. An event that causes stress for one person may not be stressful for another person. Major life events commonly cause stress. These may be positive or negative. Examples include losing your job, moving into a new home, getting married, having a baby, or losing a loved one. Less obvious life events may also cause stress, especially if they occur day after day or in combination. Examples include working long hours, driving in traffic, caring for children, being in debt, or being in a difficult relationship. SIGNS AND SYMPTOMS Stress may cause emotional symptoms including, the following:  Anxiety--This is feeling worried, afraid, on edge, overwhelmed, or out of control.  Anger--This is feeling irritated or impatient.  Depression--This is feeling sad, down, helpless, or guilty.  Difficulty focusing, remembering, or making decisions. Stress may cause physical symptoms, including the following:   Aches and pains--These may affect your head, neck, back, stomach, or other areas of your body.  Tight muscles or clenched jaw.  Low energy or trouble sleeping. Stress may cause unhealthy behaviors, including the following:   Eating to feel better (overeating) or skipping meals.  Sleeping too little, too much, or both.  Working  too much or putting off tasks (procrastination).  Smoking, drinking alcohol, or using drugs to feel better. DIAGNOSIS  Stress is diagnosed through an assessment by your health care provider. Your health care provider will ask questions about your symptoms and any stressful life events.Your health care provider will also ask about your medical history and may order blood tests or other tests. Certain medical conditions and medicine can cause physical symptoms similar to stress. Mental illness can cause emotional symptoms and unhealthy behaviors similar to stress. Your health care provider may refer you to a mental health professional for further evaluation.  TREATMENT  Stress management is the recommended treatment for stress.The goals of stress management are reducing stressful life events and coping with stress in healthy ways.  Techniques for reducing stressful life events include the following:  Stress identification--Self-monitor for stress and identify what causes stress for you. These skills may help you to avoid some stressful events.  Time management--Set your priorities, keep a calendar of events, and learn to say "no." These tools can help you avoid making too many commitments. Techniques for coping with stress include the following:  Rethinking the problem--Try to think realistically about stressful events rather than ignoring them or overreacting. Try to find the positives in a stressful situation rather than focusing on the negatives.  Exercise--Physical exercise can release both physical and emotional tension. The key is to find a form of exercise you enjoy and do it regularly.  Relaxation techniques. These relax the body and mind. Examples include yoga, meditation, tai chi, biofeedback, deep breathing, progressive muscle relaxation, listening to music, being  out in nature, journaling, and other hobbies. Again, the key is to find one or more that you enjoy and can do  regularly.  Healthy lifestyle--Eat a balanced diet, get plenty of sleep, and do not smoke. Avoid using alcohol or drugs to relax.  Strong support network--Spend time with family, friends, or other people you enjoy being around.Express your feelings and talk things over with someone you trust. Counseling or talktherapy with a mental health professional may be helpful if you are having difficulty managing stress on your own. Medicine is typically not recommended for the treatment of stress.Talk to your health care provider if you think you need medicine for symptoms of stress. HOME CARE INSTRUCTIONS:  Keep all follow up appointments with your health care provider.  Only take any prescribed medicines as directed by your health care provider.  Talk to your health care provider before starting any new prescription or over-the-counter medicines. SEEK MEDICAL CARE IF:  Your symptoms get worse or you start having new symptoms.  You feel overwhelmed by your problems and can no longer manage them on your own. SEEK IMMEDIATE MEDICAL CARE IF:  You feel like hurting yourself or someone else. Document Released: 12/17/2000 Document Revised: 06/28/2013 Document Reviewed: 02/15/2013 [facility] Patient Information 2015 [place], [place]. This information is not intended to replace advice given to you by your health care provider. Make sure you discuss any questions you have with your health care provider. Panic Attacks Panic attacks are sudden, short-livedsurges of severe anxiety, fear, or discomfort. They may occur for no reason when you are relaxed, when you are anxious, or when you are sleeping. Panic attacks may occur for a number of reasons:   Healthy people occasionally have panic attacks in extreme, life-threatening situations, such as war or natural disasters. Normal anxiety is a protective mechanism of the body that helps [place] react to danger (fight or flight response).  Panic attacks are often  seen with anxiety disorders, such as panic disorder, social anxiety disorder, generalized anxiety disorder, and phobias. Anxiety disorders cause excessive or uncontrollable anxiety. They may interfere with your relationships or other life activities.  Panic attacks are sometimes seen with other mental illnesses, such as depression and posttraumatic stress disorder.  Certain medical conditions, prescription medicines, and drugs of abuse can cause panic attacks. SYMPTOMS  Panic attacks start suddenly, peak within 20 minutes, and are accompanied by four or more of the following symptoms:  Pounding heart or fast heart rate (palpitations).  Sweating.  Trembling or shaking.  Shortness of breath or feeling smothered.  Feeling choked.  Chest pain or discomfort.  Nausea or strange feeling in your stomach.  Dizziness, light-headedness, or feeling like you will faint.  Chills or hot flushes.  Numbness or tingling in your lips or hands and feet.  Feeling that things are not real or feeling that you are not yourself.  Fear of losing control or going crazy.  Fear of dying. Some of these symptoms can mimic serious medical conditions. For example, you may think you are having a heart attack. Although panic attacks can be very scary, they are not life threatening. DIAGNOSIS  Panic attacks are diagnosed through an assessment by your health care provider. Your health care provider will ask questions about your symptoms, such as where and when they occurred. Your health care provider will also ask about your medical history and use of alcohol and drugs, including prescription medicines. Your health care provider may order blood tests or other studies to rule  out a serious medical condition. Your health care provider may refer you to a mental health professional for further evaluation. TREATMENT   Most healthy people who have one or two panic attacks in an extreme, life-threatening situation will  not require treatment.  The treatment for panic attacks associated with anxiety disorders or other mental illness typically involves counseling with a mental health professional, medicine, or a combination of both. Your health care provider will help determine what treatment is best for you.  Panic attacks due to physical illness usually go away with treatment of the illness. If prescription medicine is causing panic attacks, talk with your health care provider about stopping the medicine, decreasing the dose, or substituting another medicine.  Panic attacks due to alcohol or drug abuse go away with abstinence. Some adults need professional help in order to stop drinking or using drugs. HOME CARE INSTRUCTIONS   Take all medicines as directed by your health care provider.   Schedule and attend follow-up visits as directed by your health care provider. It is important to keep all your appointments. SEEK MEDICAL CARE IF:  You are not able to take your medicines as prescribed.  Your symptoms do not improve or get worse. SEEK IMMEDIATE MEDICAL CARE IF:   You experience panic attack symptoms that are different than your usual symptoms.  You have serious thoughts about hurting yourself or others.  You are taking medicine for panic attacks and have a serious side effect. MAKE SURE YOU:  Understand these instructions.  Will watch your condition.  Will get help right away if you are not doing well or get worse. Document Released: 06/23/2005 Document Revised: 06/28/2013 Document Reviewed: 02/04/2013 [facility] Patient Information 2015 [place], [place]. This information is not intended to replace advice given to you by your health care provider. Make sure you discuss any questions you have with your health care provider.

## 2014-01-25 NOTE — Progress Notes (Signed)
   Subjective:    Patient ID: Haley Trevino, female    DOB: Jun 06, 1941, 74 y.o.   MRN: [id number]  HPI Patient in c/o panic attacks. Had to call EMS last night because she thought she was having a heart attack. Her EKG was nromal so they said she was having a panic attack- SHe use to be on klonopin but hasn't taken in awhile. SHe is under a lot of stress with family issues. Needs to go back on klonopin. Currently not having any pain.    Review of Systems  Constitutional: Negative.   HENT: Negative.   Respiratory: Negative.   Cardiovascular: Negative.   Genitourinary: Negative.   Psychiatric/Behavioral: The patient is nervous/anxious.   All other systems reviewed and are negative.      Objective:   Physical Exam  Constitutional: She is oriented to person, place, and time. She appears well-developed and well-nourished. No distress.  Cardiovascular: Normal rate, regular rhythm and normal heart sounds.   Pulmonary/Chest: Effort normal and breath sounds normal.  Neurological: She is alert and oriented to person, place, and time.  Skin: Skin is warm and dry.  Psychiatric: She has a normal mood and affect. Her behavior is normal. Judgment and thought content normal.  Talkative and anxious at appointment   BP 150/80  Pulse 76  Temp(Src) 98.3 F (36.8 C) (Oral)  Ht 5\' 2"  (1.575 m)  Wt 121 lb 9.6 oz (55.157 kg)  BMI 22.24 kg/m2        Assessment & Plan:   1. Panic attacks    Meds ordered this encounter  Medications  . clonazePAM (KLONOPIN) 0.5 MG tablet    Sig: Take 1 tablet (0.5 mg total) by mouth 2 (two) times daily as needed for anxiety.    Dispense:  60 tablet    Refill:  1    Order Specific Question:  Supervising Provider    Answer:  Joycelyn Man   Stress management RTO prn  Mary-Margaret Hassell Done, FNP

## 2014-02-21 ENCOUNTER — Emergency Department (HOSPITAL_COMMUNITY): Payer: Medicare Other

## 2014-02-21 ENCOUNTER — Encounter (HOSPITAL_COMMUNITY): Payer: Self-pay | Admitting: Emergency Medicine

## 2014-02-21 ENCOUNTER — Inpatient Hospital Stay (HOSPITAL_COMMUNITY)
Admission: EM | Admit: 2014-02-21 | Discharge: 2014-02-25 | DRG: 482 | Disposition: A | Discharge status: Skilled Nursing Facility | Payer: Medicare Other | Attending: Family Medicine | Admitting: Family Medicine

## 2014-02-21 DIAGNOSIS — K219 Gastro-esophageal reflux disease without esophagitis: Secondary | ICD-10-CM | POA: Diagnosis present

## 2014-02-21 DIAGNOSIS — F329 Major depressive disorder, single episode, unspecified: Secondary | ICD-10-CM | POA: Diagnosis not present

## 2014-02-21 DIAGNOSIS — S72143A Displaced intertrochanteric fracture of unspecified femur, initial encounter for closed fracture: Secondary | ICD-10-CM | POA: Diagnosis not present

## 2014-02-21 DIAGNOSIS — Z800 Family history of malignant neoplasm of digestive organs: Secondary | ICD-10-CM | POA: Diagnosis not present

## 2014-02-21 DIAGNOSIS — E039 Hypothyroidism, unspecified: Secondary | ICD-10-CM | POA: Diagnosis not present

## 2014-02-21 DIAGNOSIS — M1990 Unspecified osteoarthritis, unspecified site: Secondary | ICD-10-CM | POA: Diagnosis not present

## 2014-02-21 DIAGNOSIS — W010XXA Fall on same level from slipping, tripping and stumbling without subsequent striking against object, initial encounter: Secondary | ICD-10-CM | POA: Diagnosis present

## 2014-02-21 DIAGNOSIS — D62 Acute posthemorrhagic anemia: Secondary | ICD-10-CM | POA: Diagnosis not present

## 2014-02-21 DIAGNOSIS — S72001A Fracture of unspecified part of neck of right femur, initial encounter for closed fracture: Secondary | ICD-10-CM

## 2014-02-21 DIAGNOSIS — I10 Essential (primary) hypertension: Secondary | ICD-10-CM | POA: Diagnosis present

## 2014-02-21 DIAGNOSIS — Y92009 Unspecified place in unspecified non-institutional (private) residence as the place of occurrence of the external cause: Secondary | ICD-10-CM

## 2014-02-21 DIAGNOSIS — R262 Difficulty in walking, not elsewhere classified: Secondary | ICD-10-CM | POA: Diagnosis not present

## 2014-02-21 DIAGNOSIS — M6281 Muscle weakness (generalized): Secondary | ICD-10-CM | POA: Diagnosis not present

## 2014-02-21 DIAGNOSIS — S298XXA Other specified injuries of thorax, initial encounter: Secondary | ICD-10-CM | POA: Diagnosis not present

## 2014-02-21 DIAGNOSIS — S72009A Fracture of unspecified part of neck of unspecified femur, initial encounter for closed fracture: Secondary | ICD-10-CM | POA: Diagnosis not present

## 2014-02-21 DIAGNOSIS — S72009D Fracture of unspecified part of neck of unspecified femur, subsequent encounter for closed fracture with routine healing: Secondary | ICD-10-CM | POA: Diagnosis not present

## 2014-02-21 DIAGNOSIS — M349 Systemic sclerosis, unspecified: Secondary | ICD-10-CM | POA: Diagnosis not present

## 2014-02-21 DIAGNOSIS — F3289 Other specified depressive episodes: Secondary | ICD-10-CM | POA: Diagnosis not present

## 2014-02-21 DIAGNOSIS — I7300 Raynaud's syndrome without gangrene: Secondary | ICD-10-CM | POA: Diagnosis not present

## 2014-02-21 DIAGNOSIS — Z85820 Personal history of malignant melanoma of skin: Secondary | ICD-10-CM | POA: Diagnosis not present

## 2014-02-21 DIAGNOSIS — R279 Unspecified lack of coordination: Secondary | ICD-10-CM | POA: Diagnosis not present

## 2014-02-21 DIAGNOSIS — F411 Generalized anxiety disorder: Secondary | ICD-10-CM | POA: Diagnosis not present

## 2014-02-21 DIAGNOSIS — IMO0002 Reserved for concepts with insufficient information to code with codable children: Secondary | ICD-10-CM | POA: Diagnosis not present

## 2014-02-21 DIAGNOSIS — M25559 Pain in unspecified hip: Secondary | ICD-10-CM | POA: Diagnosis not present

## 2014-02-21 LAB — URINALYSIS, ROUTINE W REFLEX MICROSCOPIC
BILIRUBIN URINE: NEGATIVE
Glucose, UA: NEGATIVE mg/dL
Hgb urine dipstick: NEGATIVE
Ketones, ur: NEGATIVE mg/dL
Leukocytes, UA: NEGATIVE
Nitrite: NEGATIVE
PROTEIN: NEGATIVE mg/dL
Specific Gravity, Urine: 1.01 (ref 1.005–1.030)
UROBILINOGEN UA: 0.2 mg/dL (ref 0.0–1.0)
pH: 7.5 (ref 5.0–8.0)

## 2014-02-21 LAB — CBC WITH DIFFERENTIAL/PLATELET
BASOS PCT: 0 % (ref 0–1)
Basophils Absolute: 0 10*3/uL (ref 0.0–0.1)
EOS PCT: 0 % (ref 0–5)
Eosinophils Absolute: 0.1 10*3/uL (ref 0.0–0.7)
HCT: 35.5 % — ABNORMAL LOW (ref 36.0–46.0)
Hemoglobin: 12.3 g/dL (ref 12.0–15.0)
Lymphocytes Relative: 10 % — ABNORMAL LOW (ref 12–46)
Lymphs Abs: 1.2 10*3/uL (ref 0.7–4.0)
MCH: 27.8 pg (ref 26.0–34.0)
MCHC: 34.6 g/dL (ref 30.0–36.0)
MCV: 80.1 fL (ref 78.0–100.0)
Monocytes Absolute: 0.7 10*3/uL (ref 0.1–1.0)
Monocytes Relative: 6 % (ref 3–12)
NEUTROS PCT: 84 % — AB (ref 43–77)
Neutro Abs: 9.9 10*3/uL — ABNORMAL HIGH (ref 1.7–7.7)
PLATELETS: 338 10*3/uL (ref 150–400)
RBC: 4.43 MIL/uL (ref 3.87–5.11)
RDW: 15.6 % — AB (ref 11.5–15.5)
WBC: 11.9 10*3/uL — ABNORMAL HIGH (ref 4.0–10.5)

## 2014-02-21 MED ORDER — ONDANSETRON HCL 4 MG/2ML IJ SOLN
4.0000 mg | Freq: Once | INTRAMUSCULAR | Status: AC
  Administered 2014-02-21: 4 mg via INTRAVENOUS
  Filled 2014-02-21: qty 2

## 2014-02-21 MED ORDER — FENTANYL CITRATE 0.05 MG/ML IJ SOLN
50.0000 ug | Freq: Once | INTRAMUSCULAR | Status: AC
  Administered 2014-02-21: 50 ug via INTRAVENOUS
  Filled 2014-02-21: qty 2

## 2014-02-21 NOTE — ED Notes (Signed)
Fell in her driveway, c/o pain right thigh and hip

## 2014-02-22 ENCOUNTER — Encounter (HOSPITAL_COMMUNITY): Payer: Self-pay | Admitting: *Deleted

## 2014-02-22 ENCOUNTER — Inpatient Hospital Stay (HOSPITAL_COMMUNITY): Payer: Medicare Other | Admitting: Anesthesiology

## 2014-02-22 ENCOUNTER — Inpatient Hospital Stay (HOSPITAL_COMMUNITY): Payer: Medicare Other

## 2014-02-22 ENCOUNTER — Encounter (HOSPITAL_COMMUNITY): Payer: Medicare Other | Admitting: Anesthesiology

## 2014-02-22 ENCOUNTER — Encounter (HOSPITAL_COMMUNITY)
Admission: EM | Disposition: A | Discharge status: Skilled Nursing Facility | Payer: Self-pay | Source: Home / Self Care | Attending: Family Medicine

## 2014-02-22 DIAGNOSIS — I7300 Raynaud's syndrome without gangrene: Secondary | ICD-10-CM | POA: Diagnosis not present

## 2014-02-22 DIAGNOSIS — W010XXA Fall on same level from slipping, tripping and stumbling without subsequent striking against object, initial encounter: Secondary | ICD-10-CM | POA: Diagnosis present

## 2014-02-22 DIAGNOSIS — R279 Unspecified lack of coordination: Secondary | ICD-10-CM | POA: Diagnosis not present

## 2014-02-22 DIAGNOSIS — IMO0002 Reserved for concepts with insufficient information to code with codable children: Secondary | ICD-10-CM | POA: Diagnosis not present

## 2014-02-22 DIAGNOSIS — S72143A Displaced intertrochanteric fracture of unspecified femur, initial encounter for closed fracture: Secondary | ICD-10-CM | POA: Diagnosis not present

## 2014-02-22 DIAGNOSIS — S72009A Fracture of unspecified part of neck of unspecified femur, initial encounter for closed fracture: Secondary | ICD-10-CM

## 2014-02-22 DIAGNOSIS — M1990 Unspecified osteoarthritis, unspecified site: Secondary | ICD-10-CM | POA: Diagnosis not present

## 2014-02-22 DIAGNOSIS — E039 Hypothyroidism, unspecified: Secondary | ICD-10-CM | POA: Diagnosis not present

## 2014-02-22 DIAGNOSIS — Y92009 Unspecified place in unspecified non-institutional (private) residence as the place of occurrence of the external cause: Secondary | ICD-10-CM | POA: Diagnosis not present

## 2014-02-22 DIAGNOSIS — S72009D Fracture of unspecified part of neck of unspecified femur, subsequent encounter for closed fracture with routine healing: Secondary | ICD-10-CM | POA: Diagnosis not present

## 2014-02-22 DIAGNOSIS — F3289 Other specified depressive episodes: Secondary | ICD-10-CM | POA: Diagnosis not present

## 2014-02-22 DIAGNOSIS — M6281 Muscle weakness (generalized): Secondary | ICD-10-CM | POA: Diagnosis not present

## 2014-02-22 DIAGNOSIS — I10 Essential (primary) hypertension: Secondary | ICD-10-CM | POA: Diagnosis present

## 2014-02-22 DIAGNOSIS — R262 Difficulty in walking, not elsewhere classified: Secondary | ICD-10-CM | POA: Diagnosis not present

## 2014-02-22 DIAGNOSIS — K219 Gastro-esophageal reflux disease without esophagitis: Secondary | ICD-10-CM | POA: Diagnosis present

## 2014-02-22 DIAGNOSIS — D62 Acute posthemorrhagic anemia: Secondary | ICD-10-CM | POA: Diagnosis not present

## 2014-02-22 DIAGNOSIS — M349 Systemic sclerosis, unspecified: Secondary | ICD-10-CM | POA: Diagnosis not present

## 2014-02-22 DIAGNOSIS — M25559 Pain in unspecified hip: Secondary | ICD-10-CM | POA: Diagnosis present

## 2014-02-22 DIAGNOSIS — F329 Major depressive disorder, single episode, unspecified: Secondary | ICD-10-CM | POA: Diagnosis not present

## 2014-02-22 DIAGNOSIS — Z85820 Personal history of malignant melanoma of skin: Secondary | ICD-10-CM | POA: Diagnosis not present

## 2014-02-22 DIAGNOSIS — Z800 Family history of malignant neoplasm of digestive organs: Secondary | ICD-10-CM | POA: Diagnosis not present

## 2014-02-22 HISTORY — PX: ORIF HIP FRACTURE: SHX2125

## 2014-02-22 LAB — BASIC METABOLIC PANEL
Anion gap: 12 (ref 5–15)
Anion gap: 16 — ABNORMAL HIGH (ref 5–15)
BUN: 7 mg/dL (ref 6–23)
BUN: 7 mg/dL (ref 6–23)
CALCIUM: 8.7 mg/dL (ref 8.4–10.5)
CALCIUM: 9.1 mg/dL (ref 8.4–10.5)
CO2: 21 mEq/L (ref 19–32)
CO2: 25 mEq/L (ref 19–32)
Chloride: 103 mEq/L (ref 96–112)
Chloride: 106 mEq/L (ref 96–112)
Creatinine, Ser: 0.68 mg/dL (ref 0.50–1.10)
Creatinine, Ser: 0.72 mg/dL (ref 0.50–1.10)
GFR calc Af Amer: >90 mL/min (ref >90)
GFR calc non Af Amer: 85 mL/min — ABNORMAL LOW (ref >90)
GFR, EST NON AFRICAN AMERICAN: 84 mL/min — AB (ref >90)
GLUCOSE: 135 mg/dL — AB (ref 70–99)
GLUCOSE: 127 mg/dL — AB (ref 70–99)
Potassium: 3.4 mEq/L — ABNORMAL LOW (ref 3.7–5.3)
Potassium: 3.8 mEq/L (ref 3.7–5.3)
Sodium: 140 mEq/L (ref 137–147)
Sodium: 143 mEq/L (ref 137–147)

## 2014-02-22 LAB — CBC
HCT: 32.5 % — ABNORMAL LOW (ref 36.0–46.0)
Hemoglobin: 11.1 g/dL — ABNORMAL LOW (ref 12.0–15.0)
MCH: 28.4 pg (ref 26.0–34.0)
MCHC: 34.2 g/dL (ref 30.0–36.0)
MCV: 83.1 fL (ref 78.0–100.0)
Platelets: 323 10*3/uL (ref 150–400)
RBC: 3.91 MIL/uL (ref 3.87–5.11)
RDW: 15.8 % — ABNORMAL HIGH (ref 11.5–15.5)
WBC: 12.2 10*3/uL — ABNORMAL HIGH (ref 4.0–10.5)

## 2014-02-22 LAB — PREPARE RBC (CROSSMATCH)

## 2014-02-22 LAB — SURGICAL PCR SCREEN
MRSA, PCR: NEGATIVE
Staphylococcus aureus: NEGATIVE

## 2014-02-22 LAB — ABO/RH: ABO/RH(D): O POS

## 2014-02-22 LAB — PROTIME-INR
INR: 0.95 (ref 0.00–1.49)
PROTHROMBIN TIME: 12.7 s (ref 11.6–15.2)

## 2014-02-22 SURGERY — OPEN REDUCTION INTERNAL FIXATION HIP
Anesthesia: Spinal | Site: Hip | Laterality: Right

## 2014-02-22 MED ORDER — PROPOFOL INFUSION 10 MG/ML OPTIME
INTRAVENOUS | Status: DC | PRN
Start: 2014-02-22 — End: 2014-02-22
  Administered 2014-02-22: 14:00:00 via INTRAVENOUS
  Administered 2014-02-22: 25 ug/kg/min via INTRAVENOUS
  Administered 2014-02-22: 50 ug/kg/min via INTRAVENOUS

## 2014-02-22 MED ORDER — PROPOFOL 10 MG/ML IV BOLUS
INTRAVENOUS | Status: AC
  Filled 2014-02-22: qty 20

## 2014-02-22 MED ORDER — CEFAZOLIN SODIUM-DEXTROSE 2-3 GM-% IV SOLR
2.0000 g | Freq: Once | INTRAVENOUS | Status: AC
  Administered 2014-02-22: 2 g via INTRAVENOUS
  Filled 2014-02-22: qty 50

## 2014-02-22 MED ORDER — POVIDONE-IODINE 10 % EX SOLN
Freq: Once | CUTANEOUS | Status: AC
  Administered 2014-02-22: 1 via TOPICAL

## 2014-02-22 MED ORDER — ALBUTEROL SULFATE (2.5 MG/3ML) 0.083% IN NEBU
2.5000 mg | INHALATION_SOLUTION | Freq: Three times a day (TID) | RESPIRATORY_TRACT | Status: DC
  Administered 2014-02-23: 2.5 mg via RESPIRATORY_TRACT
  Filled 2014-02-22: qty 3

## 2014-02-22 MED ORDER — FLUOXETINE HCL 20 MG PO TABS
20.0000 mg | ORAL_TABLET | ORAL | Status: DC
  Administered 2014-02-23: 20 mg via ORAL
  Filled 2014-02-22 (×5): qty 1

## 2014-02-22 MED ORDER — SODIUM CHLORIDE 0.9 % IV SOLN: 20.0000 mL | INTRAVENOUS | Status: DC

## 2014-02-22 MED ORDER — FENTANYL CITRATE 0.05 MG/ML IJ SOLN
INTRAMUSCULAR | Status: AC
  Filled 2014-02-22: qty 2

## 2014-02-22 MED ORDER — ACETAMINOPHEN 325 MG PO TABS: 650.0000 mg | ORAL_TABLET | Freq: Four times a day (QID) | ORAL | Status: DC | PRN

## 2014-02-22 MED ORDER — BUPIVACAINE IN DEXTROSE 0.75-8.25 % IT SOLN
INTRATHECAL | Status: AC
  Filled 2014-02-22: qty 2

## 2014-02-22 MED ORDER — FENTANYL CITRATE 0.05 MG/ML IJ SOLN: 25.0000 ug | INTRAMUSCULAR | Status: DC | PRN

## 2014-02-22 MED ORDER — PANTOPRAZOLE SODIUM 40 MG PO TBEC
40.0000 mg | DELAYED_RELEASE_TABLET | Freq: Every day | ORAL | Status: DC
  Administered 2014-02-23 – 2014-02-25 (×3): 40 mg via ORAL
  Filled 2014-02-22 (×4): qty 1

## 2014-02-22 MED ORDER — CLONAZEPAM 0.5 MG PO TABS: 0.5000 mg | ORAL_TABLET | Freq: Two times a day (BID) | ORAL | Status: DC | PRN

## 2014-02-22 MED ORDER — FENTANYL CITRATE 0.05 MG/ML IJ SOLN
75.0000 ug | Freq: Once | INTRAMUSCULAR | Status: AC
  Administered 2014-02-22: 75 ug via INTRAVENOUS
  Filled 2014-02-22: qty 2

## 2014-02-22 MED ORDER — LACTATED RINGERS IV SOLN
INTRAVENOUS | Status: DC | PRN
  Administered 2014-02-22: 11:00:00 via INTRAVENOUS

## 2014-02-22 MED ORDER — BUPIVACAINE IN DEXTROSE 0.75-8.25 % IT SOLN
INTRATHECAL | Status: DC | PRN
  Administered 2014-02-22: 14 mg via INTRATHECAL

## 2014-02-22 MED ORDER — ONDANSETRON HCL 4 MG/2ML IJ SOLN
4.0000 mg | Freq: Four times a day (QID) | INTRAMUSCULAR | Status: DC | PRN
  Administered 2014-02-22 – 2014-02-25 (×7): 4 mg via INTRAVENOUS
  Filled 2014-02-22 (×8): qty 2

## 2014-02-22 MED ORDER — FENTANYL CITRATE 0.05 MG/ML IJ SOLN
75.0000 ug | INTRAMUSCULAR | Status: DC | PRN
  Administered 2014-02-22 – 2014-02-23 (×13): 75 ug via INTRAVENOUS
  Filled 2014-02-22 (×13): qty 2

## 2014-02-22 MED ORDER — MIDAZOLAM HCL 5 MG/5ML IJ SOLN
INTRAMUSCULAR | Status: DC | PRN
  Administered 2014-02-22 (×2): 1 mg via INTRAVENOUS

## 2014-02-22 MED ORDER — SILDENAFIL CITRATE 20 MG PO TABS
20.0000 mg | ORAL_TABLET | Freq: Two times a day (BID) | ORAL | Status: DC
  Administered 2014-02-22 – 2014-02-25 (×6): 20 mg via ORAL
  Filled 2014-02-22 (×12): qty 1

## 2014-02-22 MED ORDER — ALUM & MAG HYDROXIDE-SIMETH 200-200-20 MG/5ML PO SUSP: 30.0000 mL | Freq: Four times a day (QID) | ORAL | Status: DC | PRN

## 2014-02-22 MED ORDER — ONDANSETRON HCL 4 MG/2ML IJ SOLN
4.0000 mg | Freq: Once | INTRAMUSCULAR | Status: AC
  Administered 2014-02-22: 4 mg via INTRAVENOUS

## 2014-02-22 MED ORDER — ENOXAPARIN SODIUM 40 MG/0.4ML ~~LOC~~ SOLN
40.0000 mg | SUBCUTANEOUS | Status: DC
  Administered 2014-02-23 – 2014-02-25 (×3): 40 mg via SUBCUTANEOUS
  Filled 2014-02-22 (×4): qty 0.4

## 2014-02-22 MED ORDER — FENTANYL CITRATE 0.05 MG/ML IJ SOLN
INTRAMUSCULAR | Status: DC | PRN
  Administered 2014-02-22: 25 ug via INTRAVENOUS
  Administered 2014-02-22: 30 ug via INTRAVENOUS
  Administered 2014-02-22: 50 ug via INTRAVENOUS
  Administered 2014-02-22: 25 ug via INTRAVENOUS
  Administered 2014-02-22: 50 ug via INTRAVENOUS
  Administered 2014-02-22: 20 ug via INTRATHECAL

## 2014-02-22 MED ORDER — MIDAZOLAM HCL 2 MG/2ML IJ SOLN
1.0000 mg | INTRAMUSCULAR | Status: DC | PRN
  Administered 2014-02-22: 2 mg via INTRAVENOUS

## 2014-02-22 MED ORDER — HYDROGEN PEROXIDE 3 % EX SOLN
CUTANEOUS | Status: DC | PRN
  Administered 2014-02-22: 1

## 2014-02-22 MED ORDER — LEVOTHYROXINE SODIUM 50 MCG PO TABS
50.0000 ug | ORAL_TABLET | Freq: Every day | ORAL | Status: DC
  Administered 2014-02-22 – 2014-02-25 (×4): 50 ug via ORAL
  Filled 2014-02-22 (×5): qty 1

## 2014-02-22 MED ORDER — DOCUSATE SODIUM 100 MG PO CAPS: 100.0000 mg | ORAL_CAPSULE | Freq: Every day | ORAL | Status: DC | PRN

## 2014-02-22 MED ORDER — LACTATED RINGERS IV SOLN
INTRAVENOUS | Status: DC
  Administered 2014-02-22: 1000 mL via INTRAVENOUS

## 2014-02-22 MED ORDER — OXYCODONE-ACETAMINOPHEN 5-325 MG PO TABS
1.0000 | ORAL_TABLET | Freq: Four times a day (QID) | ORAL | Status: DC | PRN
  Administered 2014-02-22 – 2014-02-25 (×12): 1 via ORAL
  Filled 2014-02-22 (×12): qty 1

## 2014-02-22 MED ORDER — MIDAZOLAM HCL 2 MG/2ML IJ SOLN
INTRAMUSCULAR | Status: AC
  Filled 2014-02-22: qty 2

## 2014-02-22 MED ORDER — FENTANYL CITRATE 0.05 MG/ML IJ SOLN
25.0000 ug | INTRAMUSCULAR | Status: AC
  Administered 2014-02-22 (×2): 25 ug via INTRAVENOUS

## 2014-02-22 MED ORDER — ONDANSETRON HCL 4 MG/2ML IJ SOLN
INTRAMUSCULAR | Status: AC
  Filled 2014-02-22: qty 2

## 2014-02-22 MED ORDER — ALBUTEROL SULFATE (2.5 MG/3ML) 0.083% IN NEBU
2.5000 mg | INHALATION_SOLUTION | Freq: Four times a day (QID) | RESPIRATORY_TRACT | Status: DC
  Administered 2014-02-22: 2.5 mg via RESPIRATORY_TRACT
  Filled 2014-02-22: qty 3

## 2014-02-22 MED ORDER — LIDOCAINE HCL (CARDIAC) 10 MG/ML IV SOLN
INTRAVENOUS | Status: DC | PRN
  Administered 2014-02-22: 50 mg via INTRAVENOUS

## 2014-02-22 MED ORDER — SODIUM CHLORIDE 0.9 % IR SOLN
Status: DC | PRN
  Administered 2014-02-22: 1000 mL

## 2014-02-22 MED ORDER — ONDANSETRON HCL 4 MG/2ML IJ SOLN: 4.0000 mg | Freq: Once | INTRAMUSCULAR | Status: DC | PRN

## 2014-02-22 MED ORDER — ACETAMINOPHEN 650 MG RE SUPP: 650.0000 mg | Freq: Four times a day (QID) | RECTAL | Status: DC | PRN

## 2014-02-22 MED ORDER — NIFEDIPINE ER OSMOTIC RELEASE 30 MG PO TB24
90.0000 mg | ORAL_TABLET | Freq: Every day | ORAL | Status: DC
  Administered 2014-02-23 – 2014-02-25 (×3): 90 mg via ORAL
  Filled 2014-02-22 (×4): qty 3

## 2014-02-22 SURGICAL SUPPLY — 54 items
BAG HAMPER (MISCELLANEOUS) ×2 IMPLANT
BIT DRILL TWIST 3.5MM (BIT) ×1 IMPLANT
BLADE 10 SAFETY STRL DISP (BLADE) ×4 IMPLANT
BLADE SURG SZ20 CARB STEEL (BLADE) ×2 IMPLANT
CLEANER TIP ELECTROSURG 2X2 (MISCELLANEOUS) ×1 IMPLANT
CLOTH BEACON ORANGE TIMEOUT ST (SAFETY) ×2 IMPLANT
COVER LIGHT HANDLE STERIS (MISCELLANEOUS) ×4 IMPLANT
COVER MAYO STAND XLG (DRAPE) ×2 IMPLANT
DRAPE STERI IOBAN 125X83 (DRAPES) ×2 IMPLANT
DRILL TWIST 3.5MM (BIT) ×2
ELECT REM PT RETURN 9FT ADLT (ELECTROSURGICAL) ×2
ELECTRODE REM PT RTRN 9FT ADLT (ELECTROSURGICAL) ×1 IMPLANT
EVACUATOR 3/16  PVC DRAIN (DRAIN) ×1
EVACUATOR 3/16 PVC DRAIN (DRAIN) ×1 IMPLANT
GAUZE SPONGE 4X4 12PLY STRL (GAUZE/BANDAGES/DRESSINGS) ×2 IMPLANT
GAUZE XEROFORM 5X9 LF (GAUZE/BANDAGES/DRESSINGS) ×2 IMPLANT
GLOVE BIO SURGEON STRL SZ8 (GLOVE) ×2 IMPLANT
GLOVE BIO SURGEON STRL SZ8.5 (GLOVE) ×2 IMPLANT
GLOVE BIOGEL M 7.0 STRL (GLOVE) ×1 IMPLANT
GLOVE BIOGEL PI IND STRL 7.0 (GLOVE) IMPLANT
GLOVE BIOGEL PI INDICATOR 7.0 (GLOVE) ×3
GLOVE ECLIPSE 6.5 STRL STRAW (GLOVE) ×1 IMPLANT
GLOVE EXAM NITRILE MD LF STRL (GLOVE) ×1 IMPLANT
GOWN STRL REUS W/TWL LRG LVL3 (GOWN DISPOSABLE) ×5 IMPLANT
GOWN STRL REUS W/TWL XL LVL3 (GOWN DISPOSABLE) ×2 IMPLANT
GUIDE PIN CALIBRATED (PIN) ×2 IMPLANT
GUIDE PIN CALIBRATED 2.4X23 (PIN) ×1 IMPLANT
INST SET MAJOR BONE (KITS) ×2 IMPLANT
KIT BLADEGUARD II DBL (SET/KITS/TRAYS/PACK) ×2 IMPLANT
KIT ROOM TURNOVER AP CYSTO (KITS) ×2 IMPLANT
MANIFOLD NEPTUNE II (INSTRUMENTS) ×2 IMPLANT
MARKER SKIN DUAL TIP RULER LAB (MISCELLANEOUS) ×2 IMPLANT
NS IRRIG 1000ML POUR BTL (IV SOLUTION) ×2 IMPLANT
PACK BASIC III (CUSTOM PROCEDURE TRAY) ×2
PACK SRG BSC III STRL LF ECLPS (CUSTOM PROCEDURE TRAY) ×1 IMPLANT
PAD ABD 5X9 TENDERSORB (GAUZE/BANDAGES/DRESSINGS) ×2 IMPLANT
PAD ARMBOARD 7.5X6 YLW CONV (MISCELLANEOUS) ×2 IMPLANT
PENCIL HANDSWITCHING (ELECTRODE) ×2 IMPLANT
PLATE SHORT BARREL 135X4 (Plate) ×1 IMPLANT
SCREW CORTICAL SFTP 4.5X38MM (Screw) ×2 IMPLANT
SCREW CORTICAL SFTP 4.5X40MM (Screw) ×2 IMPLANT
SCREW LAG 95MM (Screw) ×2 IMPLANT
SCREW LAGSTD 95X21X12.7X9 (Screw) IMPLANT
SET BASIN LINEN APH (SET/KITS/TRAYS/PACK) ×2 IMPLANT
SPONGE DRAIN TRACH 4X4 STRL 2S (GAUZE/BANDAGES/DRESSINGS) ×1 IMPLANT
SPONGE GAUZE 4X4 12PLY (GAUZE/BANDAGES/DRESSINGS) ×1 IMPLANT
SPONGE LAP 18X18 X RAY DECT (DISPOSABLE) ×4 IMPLANT
STAPLER VISISTAT 35W (STAPLE) ×2 IMPLANT
SUT BRALON NAB BRD #1 30IN (SUTURE) ×4 IMPLANT
SUT PLAIN 2 0 XLH (SUTURE) ×2 IMPLANT
SUT SILK 0 FSL (SUTURE) ×2 IMPLANT
SYR BULB IRRIGATION 50ML (SYRINGE) ×3 IMPLANT
TAPE MEDIFIX FOAM 3 (GAUZE/BANDAGES/DRESSINGS) ×2 IMPLANT
YANKAUER SUCT 12FT TUBE ARGYLE (SUCTIONS) ×2 IMPLANT

## 2014-02-22 NOTE — Anesthesia Postprocedure Evaluation (Signed)
  Anesthesia Post-op Note  Patient: Haley Trevino  Procedure(s) Performed: Procedure(s): OPEN REDUCTION INTERNAL FIXATION RIGHT HIP (Right)  Patient Location: PACU  Anesthesia Type:Spinal  Level of Consciousness: awake, alert , oriented and patient cooperative  Airway and Oxygen Therapy: Patient Spontanous Breathing and Patient connected to nasal cannula oxygen  Post-op Pain: none  Post-op Assessment: Post-op Vital signs reviewed, Patient's Cardiovascular Status Stable, Respiratory Function Stable, Patent Airway, No signs of Nausea or vomiting and Pain level controlled  Post-op Vital Signs: Reviewed and stable  Last Vitals:  Filed Vitals:   02/22/14 1200  BP: 134/61  Pulse:   Temp:   Resp: 28    Complications: No apparent anesthesia complications

## 2014-02-22 NOTE — Anesthesia Preprocedure Evaluation (Signed)
Anesthesia Evaluation  Patient identified by MRN, date of birth, ID band Patient awake    Reviewed: Allergy & Precautions, H&P , NPO status , Patient's Chart, lab work & pertinent test results  Airway Mallampati: II TM Distance: >3 FB     Dental  (+) Edentulous Upper, Edentulous Lower   Pulmonary shortness of breath and with exertion,  breath sounds clear to auscultation        Cardiovascular hypertension, + Peripheral Vascular Disease Rhythm:Regular Rate:Normal     Neuro/Psych PSYCHIATRIC DISORDERS Depression    GI/Hepatic GERD-  Medicated and Controlled,  Endo/Other  Hypothyroidism   Renal/GU      Musculoskeletal   Abdominal   Peds  Hematology   Anesthesia Other Findings Raynaud's Dx Scleroderma  Reproductive/Obstetrics                           Anesthesia Physical Anesthesia Plan  ASA: III  Anesthesia Plan: Spinal   Post-op Pain Management:    Induction: Intravenous  Airway Management Planned: Simple Face Mask  Additional Equipment:   Intra-op Plan:   Post-operative Plan:   Informed Consent: I have reviewed the patients History and Physical, chart, labs and discussed the procedure including the risks, benefits and alternatives for the proposed anesthesia with the patient or authorized representative who has indicated his/her understanding and acceptance.     Plan Discussed with:   Anesthesia Plan Comments:         Anesthesia Quick Evaluation

## 2014-02-22 NOTE — ED Provider Notes (Signed)
Patient seen/examined in the Emergency Department in conjunction with Midlevel Provider [place] Patient reports hip pain after fall Exam : awake/alert, resting comfortably with right hip flexed.  Distal pulses intact Plan: admit for hip fracture   Sharyon Cable, MD 02/22/14 [phone]

## 2014-02-22 NOTE — ED Provider Notes (Signed)
Medical screening examination/treatment/procedure(s) were conducted as a shared visit with non-physician practitioner(s) and myself.  I personally evaluated the patient during the encounter.   EKG Interpretation None        Sharyon Cable, MD 02/22/14 [phone]

## 2014-02-22 NOTE — Transfer of Care (Signed)
Immediate Anesthesia Transfer of Care Note  Patient: Haley Trevino  Procedure(s) Performed: Procedure(s): OPEN REDUCTION INTERNAL FIXATION RIGHT HIP (Right)  Patient Location: PACU  Anesthesia Type:Spinal  Level of Consciousness: awake, alert , oriented and patient cooperative  Airway & Oxygen Therapy: Patient Spontanous Breathing and Patient connected to nasal cannula oxygen  Post-op Assessment: Report given to PACU RN and Post -op Vital signs reviewed and stable  Post vital signs: Reviewed and stable  Complications: No apparent anesthesia complications

## 2014-02-22 NOTE — Progress Notes (Signed)
Received report from Santiago Glad, Therapist, sports in [place]. Patient arrived to room and was alert and oriented. Patient had no complaints at the time. Dressing and drain were clean, dry and intact. VS were within normal limits. Bed was at lowest level, call bell was within reach and phone was at bedside. Will continue to monitor patient at this time.

## 2014-02-22 NOTE — Care Management Note (Addendum)
    Page 1 of 1   02/24/2014     9:32:06 AM CARE MANAGEMENT NOTE 02/24/2014  Patient:  Haley Trevino, Haley Trevino   Account Number:  [id number]  Date Initiated:  02/22/2014  Documentation initiated by:  Theophilus Kinds  Subjective/Objective Assessment:   Pt admitted from home with hip fracture.  Pt lives with her husband. Pt for surgery on 02/22/14.     Action/Plan:   Pt will probably need SNF post op. CSw is aware and will start bed search.   Anticipated DC Date:  02/25/2014   Anticipated DC Plan:  SKILLED NURSING FACILITY  In-house referral  Clinical Social Worker      DC Planning Services  CM consult      Choice offered to / List presented to:             Status of service:  Completed, signed off Medicare Important Message given?  YES (If response is "NO", the following Medicare IM given date fields will be blank) Date Medicare IM given:  02/24/2014 Medicare IM given by:  Theophilus Kinds Date Additional Medicare IM given:   Additional Medicare IM given by:    Discharge Disposition:  [place]  Per UR Regulation:    If discussed at Long Length of Stay Meetings, dates discussed:    Comments:  02/24/14 [place], RN BSN CM Pt should be medically clear on 02/25/14 for discharge to [facility] [place]. CSW to arrange discharge to facility when discharge orders written.  02/22/14 [place], RN BSN CM

## 2014-02-22 NOTE — Progress Notes (Signed)
  PROGRESS NOTE  Haley Trevino OAC:[id number] DOB: [birth year] DOA: 02/21/2014 PCP: Redge Gainer, MD  Summary: 73 year old woman admitted for hip fracture status post mechanical fall at home.  Assessment/Plan: 1. Right hip fracture status post mechanical fall. 2. History of Raynaud disease, scleroderma, hypothyroidism   Now postoperative day 0. Defer management to orthopedics.  Code Status: full code DVT prophylaxis: Lovenox Family Communication: none present Disposition Plan: pending therapy evaluations  Murray Hodgkins, MD  [facility]  Pager [phone] If 7PM-7AM, please contact night-coverage at [email or web address], password [facility] 02/22/2014, 3:55 PM  LOS: 1 day   Consultants:  orthopedics  Procedures:  8/19 OPEN REDUCTION INTERNAL FIXATION RIGHT HIP (Right)  HPI/Subjective: Complains of some pain otherwise doing okay.  Objective: Filed Vitals:   02/22/14 1445 02/22/14 1500 02/22/14 1514 02/22/14 1515  BP: 145/67 152/70 170/70 170/70  Pulse: 72 75 100 80  Temp:    99.3 F (37.4 C)  TempSrc:      Resp: 14 19 16 20   Height:      Weight:      SpO2: 93% 92% 95% 95%    Intake/Output Summary (Last 24 hours) at 02/22/14 1555 Last data filed at 02/22/14 1344  Gross per 24 hour  Intake    600 ml  Output    675 ml  Net    -75 ml     Filed Weights   02/21/14 2236 02/22/14 0119 02/22/14 1124  Weight: 55.339 kg (122 lb) 55.36 kg (122 lb 0.8 oz) 55.339 kg (122 lb)    Exam:     Afebrile, vital signs stable. Gen. Appears calm, comfortable.  Psych. Alert. Speech fluent and clear. Grossly normal mentation.  Cardiovascular. Regular rate and rhythm. No murmur, rub or gallop.  Respiratory. Clear to auscultation bilaterally. No wheezes, rales or rhonchi. Normal respiratory effort.  Data Reviewed:  Chemistry: Basic metabolic panel unremarkable.  Heme: CBC stable.  Other: EKG normal sinus rhythm. No acute changes.  Scheduled Meds: . albuterol  2.5 mg  Nebulization Q6H  . [START ON 02/23/2014] enoxaparin (LOVENOX) injection  40 mg Subcutaneous Q24H  . [START ON 02/23/2014] FLUoxetine  20 mg Oral QODAY  . levothyroxine  50 mcg Oral QAC breakfast  . NIFEdipine  90 mg Oral Daily  . pantoprazole  40 mg Oral Daily  . sildenafil  20 mg Oral BID   Continuous Infusions: . sodium chloride      Principal Problem:   Hip fracture requiring operative repair Active Problems:   [name]   SCLERODERMA   Time spent 20 minutes

## 2014-02-22 NOTE — Anesthesia Procedure Notes (Addendum)
Date/Time: 02/22/2014 12:03 PM Performed by: Andree Elk, AMY A Pre-anesthesia Checklist: Patient identified, Timeout performed, Emergency Drugs available, Suction available and Patient being monitored Oxygen Delivery Method: Simple face mask   Spinal  Patient location during procedure: OR Start time: 02/22/2014 12:32 PM Staffing CRNA/Resident: ADAMS, AMY A Preanesthetic Checklist Completed: patient identified, site marked, surgical consent, pre-op evaluation, timeout performed, IV checked, risks and benefits discussed and monitors and equipment checked Spinal Block Patient position: right lateral decubitus Prep: Betadine Patient monitoring: heart rate, cardiac monitor, continuous pulse ox and blood pressure Approach: right paramedian Location: L3-4 Injection technique: single-shot Needle Needle type: Spinocan  Needle gauge: 22 G Needle length: 9 cm Assessment Sensory level: T8 Additional Notes ATTEMPTS:1 TRAY [id number] TRAY EXPIRATION DATE:03/2015

## 2014-02-22 NOTE — ED Provider Notes (Signed)
CSN: [id number]     Arrival date & time 02/21/14  2233 History   First MD Initiated Contact with Patient 02/21/14 2251     Chief Complaint  Patient presents with  . Fall     (Consider location/radiation/quality/duration/timing/severity/associated sxs/prior Treatment) The history is provided by the patient.   Haley Trevino is a 73 y.o. female with a history significant for scleroderma, raynaud disease and osteoarthritis presenting for evaluation of right hip pain incurred just prior to arrival when she tripped and fell in her driveway.  She reports constant pain which is worsened with attempts at movement but also is not resolved at rest.  Her pain radiates into her right knee.  She denies loc and also denies head, neck and back pain, denies chest pain and shortness of breath.  She does have nausea since the fall.  She has had no treatments prior to arrival.     Past Medical History  Diagnosis Date  . Raynaud disease   . Scleroderma   . Osteoarthritis   . Dyspnea   . Thyroid disease     hypothyroidism   Past Surgical History  Procedure Laterality Date  . Cholecystectomy  1973  . Total abdominal hysterectomy  1974  . Breast enhancement surgery  1975  . Hand surgery  08/2012; 11/2012  . Melanoma excision      at 53 yrs of age   Family History  Problem Relation Age of Onset  . Pancreatic cancer Father   . Rashes / Skin problems Daughter     scleroderma   History  Substance Use Topics  . Smoking status: Never Smoker   . Smokeless tobacco: Never Used     Comment: passive tobacco smoke exposure  . Alcohol Use: No   OB History   Grav Para Term Preterm Abortions TAB SAB Ect Mult Living                 Review of Systems  Constitutional: Negative for fever.  Musculoskeletal: Positive for arthralgias. Negative for back pain, joint swelling, myalgias and neck pain.  Skin: Negative for wound.  Neurological: Negative for weakness, numbness and headaches.       Allergies  Dilaudid and Codeine  Home Medications   Prior to Admission medications   Medication Sig Start Date End Date Taking? Authorizing Provider  clonazePAM (KLONOPIN) 0.5 MG tablet Take 1 tablet (0.5 mg total) by mouth 2 (two) times daily as needed for anxiety. 01/25/14  Yes Mary-Margaret Hassell Done, FNP  FLUoxetine (PROZAC) 20 MG tablet Take 20 mg by mouth every other day.   Yes Historical Provider, MD  NIFEdipine (PROCARDIA-XL/ADALAT-CC/NIFEDICAL-XL) 30 MG 24 hr tablet Take 3 tablets (90 mg total) by mouth daily. 05/30/13  Yes Mary-Margaret Hassell Done, FNP  omeprazole (PRILOSEC) 20 MG capsule Take 20 mg by mouth every other day. 05/30/13  Yes Mary-Margaret Hassell Done, FNP  sildenafil (REVATIO) 20 MG tablet Take 20 mg by mouth 2 (two) times daily.   Yes Historical Provider, MD  SYNTHROID 50 MCG tablet Take 1 tablet (50 mcg total) by mouth daily before breakfast. 12/21/13  Yes Mary-Margaret Hassell Done, FNP  Probiotic Product ([place]) CAPS Take 1 capsule by mouth daily.    Historical Provider, MD   BP 161/125  Pulse 90  Temp(Src) 98.2 F (36.8 C) (Oral)  Resp 24  Ht 5\' 2"  (1.575 m)  Wt 122 lb (55.339 kg)  BMI 22.31 kg/m2  SpO2 98% Physical Exam  Constitutional: She is oriented to person, place,  and time. She appears well-developed and well-nourished.  HENT:  Head: Normocephalic and atraumatic.  Eyes: EOM are normal. Pupils are equal, round, and reactive to light.  Neck: Normal range of motion.  Cardiovascular: Normal rate and regular rhythm.   Pulses:      Dorsalis pedis pulses are 2+ on the right side, and 2+ on the left side.  Pulses equal bilaterally  Pulmonary/Chest: Effort normal and breath sounds normal.  Abdominal: Soft.  Musculoskeletal: She exhibits tenderness.       Right hip: She exhibits bony tenderness. She exhibits no deformity.  Pt holding right leg with knee and hip in flexion.  Neurological: She is alert and oriented to person, place, and time. She  has normal strength. She displays normal reflexes. No sensory deficit.  Skin: Skin is warm and dry.  Psychiatric: She has a normal mood and affect.    ED Course  Procedures (including critical care time) Labs Review Labs Reviewed  URINALYSIS, ROUTINE W REFLEX MICROSCOPIC - Abnormal; Notable for the following:    Color, Urine STRAW (*)    All other components within normal limits  BASIC METABOLIC PANEL - Abnormal; Notable for the following:    Potassium 3.4 (*)    Glucose, Bld 127 (*)    GFR calc non Af Amer 84 (*)    Anion gap 16 (*)    All other components within normal limits  CBC WITH DIFFERENTIAL - Abnormal; Notable for the following:    WBC 11.9 (*)    HCT 35.5 (*)    RDW 15.6 (*)    Neutrophils Relative % 84 (*)    Neutro Abs 9.9 (*)    Lymphocytes Relative 10 (*)    All other components within normal limits  PROTIME-INR  TYPE AND SCREEN    Imaging Review Dg Chest 1 View  02/21/2014   CLINICAL DATA:  [place].  EXAM: RIGHT FEMUR - 2 VIEW; CHEST - 1 VIEW; PELVIS - 1-2 VIEW  COMPARISON:  None.  FINDINGS: Pelvis and right femur:  Both hips are normally located. The pubic symphysis and SI joints are intact. No pelvic fractures. There is a mildly displaced intertrochanteric fracture of the right hip with a mild varus deformity. The femur is intact. The knee joint appears normal.  Chest x-ray:  The cardiac silhouette, mediastinal and hilar contours are within normal limits and stable. The lungs are clear. Calcified breast implants are noted. No pleural effusion. The bony thorax is intact.  IMPRESSION: 1. Mildly displaced intertrochanteric fracture of the right hip. 2. No acute cardiopulmonary findings.   Electronically Signed   By: Kalman Jewels M.D.   On: 02/21/2014 23:30   Dg Pelvis 1-2 Views  02/21/2014   CLINICAL DATA:  [place].  EXAM: RIGHT FEMUR - 2 VIEW; CHEST - 1 VIEW; PELVIS - 1-2 VIEW  COMPARISON:  None.  FINDINGS: Pelvis and right femur:  Both hips are normally located. The  pubic symphysis and SI joints are intact. No pelvic fractures. There is a mildly displaced intertrochanteric fracture of the right hip with a mild varus deformity. The femur is intact. The knee joint appears normal.  Chest x-ray:  The cardiac silhouette, mediastinal and hilar contours are within normal limits and stable. The lungs are clear. Calcified breast implants are noted. No pleural effusion. The bony thorax is intact.  IMPRESSION: 1. Mildly displaced intertrochanteric fracture of the right hip. 2. No acute cardiopulmonary findings.   Electronically Signed   By: Veneta Penton.D.  On: 02/21/2014 23:30   Dg Femur Right  02/21/2014   CLINICAL DATA:  [place].  EXAM: RIGHT FEMUR - 2 VIEW; CHEST - 1 VIEW; PELVIS - 1-2 VIEW  COMPARISON:  None.  FINDINGS: Pelvis and right femur:  Both hips are normally located. The pubic symphysis and SI joints are intact. No pelvic fractures. There is a mildly displaced intertrochanteric fracture of the right hip with a mild varus deformity. The femur is intact. The knee joint appears normal.  Chest x-ray:  The cardiac silhouette, mediastinal and hilar contours are within normal limits and stable. The lungs are clear. Calcified breast implants are noted. No pleural effusion. The bony thorax is intact.  IMPRESSION: 1. Mildly displaced intertrochanteric fracture of the right hip. 2. No acute cardiopulmonary findings.   Electronically Signed   By: Kalman Jewels M.D.   On: 02/21/2014 23:30     EKG Interpretation None      MDM   Final diagnoses:  Hip fracture requiring operative repair, right, closed, initial encounter    Patients labs and/or radiological studies were viewed and considered during the medical decision making and disposition process. Pt discussed with Dr Luna Glasgow.  Will consult,  Requests medical admission.  Discussed with Dr Nehemiah Settle of [facility].  Temp admission orders placed.    Evalee Jefferson, PA-C 02/22/14 [phone]

## 2014-02-22 NOTE — H&P (Signed)
History and Physical  Haley Trevino XVQ:[id number] DOB: [birth year] DOA: 02/21/2014  Referring physician: Evalee Jefferson PA-C PCP: Redge Gainer, MD   Chief Complaint:  hip pain  HPI: Haley Trevino is a 73 y.o. female  with a history of scleroderma and secondary amounts disease, hypothyroidism, depression, hypertension who presents to the emergency department after a fall approximately 9:30 PM. She was outside looking for her cat and tripped over a railroad tie, landing on her right hip. She had intense pain. She was found by her husband 30 minutes later who helped her to [place] and then called EMS. She was brought to the ER and found to have a right femoral neck fracture fracture. Pain has been fairly well controlled fentanyl. Movement exacerbates the pain. Orthopedics was consulted. They asked that I admit the patient and perforation for surgery tomorrow.    Review of Systems:   Pt denies any  fevers, chills, nausea, vomiting, chest pain, shortness of breath, palpitations, abdominal pain.  Review of systems are otherwise negative  Past Medical History  Diagnosis Date  . Raynaud disease   . Scleroderma   . Osteoarthritis   . Dyspnea   . Thyroid disease     hypothyroidism   Past Surgical History  Procedure Laterality Date  . Cholecystectomy  1973  . Total abdominal hysterectomy  1974  . Breast enhancement surgery  1975  . Hand surgery  08/2012; 11/2012  . Melanoma excision      at 68 yrs of age   Social History:  reports that she has never smoked. She has never used smokeless tobacco. She reports that she does not drink alcohol or use illicit drugs. Patient lives at at home & is able to participate in activities of daily living without assistance  Allergies  Allergen Reactions  . Dilaudid [Hydromorphone Hcl] Anaphylaxis  . Codeine     REACTION: dyspnea    Family History  Problem Relation Age of Onset  . Pancreatic cancer Father   . Rashes / Skin problems Daughter       scleroderma      Prior to Admission medications   Medication Sig Start Date End Date Taking? Authorizing Provider  clonazePAM (KLONOPIN) 0.5 MG tablet Take 1 tablet (0.5 mg total) by mouth 2 (two) times daily as needed for anxiety. 01/25/14  Yes Mary-Margaret Hassell Done, FNP  FLUoxetine (PROZAC) 20 MG tablet Take 20 mg by mouth every other day.   Yes Historical Provider, MD  NIFEdipine (PROCARDIA-XL/ADALAT-CC/NIFEDICAL-XL) 30 MG 24 hr tablet Take 3 tablets (90 mg total) by mouth daily. 05/30/13  Yes Mary-Margaret Hassell Done, FNP  omeprazole (PRILOSEC) 20 MG capsule Take 20 mg by mouth every other day. 05/30/13  Yes Mary-Margaret Hassell Done, FNP  sildenafil (REVATIO) 20 MG tablet Take 20 mg by mouth 2 (two) times daily.   Yes Historical Provider, MD  SYNTHROID 50 MCG tablet Take 1 tablet (50 mcg total) by mouth daily before breakfast. 12/21/13  Yes Mary-Margaret Hassell Done, FNP  Probiotic Product ([place]) CAPS Take 1 capsule by mouth daily.    Historical Provider, MD    Physical Exam: BP 131/82  Pulse 82  Temp(Src) 98.6 F (37 C) (Oral)  Resp 20  Ht 5\' 2"  (1.575 m)  Wt 55.339 kg (122 lb)  BMI 22.31 kg/m2  SpO2 93%  General:   in general this is a elderly Caucasian female who is awake alert and oriented x3 and in no acute distress. Eyes:  pupils equal, round, reactive to light.  ENT:  oropharynx is moist with no mucosal lesions. Neck:  supple and without lymphadenopathy. No carotid bruits or masses palpated. Cardiovascular:  regular rate with normal S1-S2 sounds. No murmurs auscultated. No jugular venous distention. Respiratory:  good respiratory effort with lungs clear to auscultation.  No wheezes, rales, rhonchi. Abdomen:  soft, nontender, nondistended with appropriate bowel sounds. No hepatosplenomegaly. Skin:  warm to touch with 2 posterior cells pedis and radial pulses. There is no edema present. Musculoskeletal:  tenderness the right hip Psychiatric:  appropriate insight and  judgment. Neurologic:  no focal neurological deficit observed.          Labs on Admission:  Basic Metabolic Panel:  Recent Labs Lab 02/21/14 2339  NA 140  K 3.4*  CL 103  CO2 21  GLUCOSE 127*  BUN 7  CREATININE 0.72  CALCIUM 9.1   Liver Function Tests: No results found for this basename: AST, ALT, ALKPHOS, BILITOT, PROT, ALBUMIN,  in the last 168 hours No results found for this basename: LIPASE, AMYLASE,  in the last 168 hours No results found for this basename: AMMONIA,  in the last 168 hours CBC:  Recent Labs Lab 02/21/14 2339  WBC 11.9*  NEUTROABS 9.9*  HGB 12.3  HCT 35.5*  MCV 80.1  PLT 338   Cardiac Enzymes: No results found for this basename: CKTOTAL, CKMB, CKMBINDEX, TROPONINI,  in the last 168 hours  BNP (last 3 results) No results found for this basename: PROBNP,  in the last 8760 hours CBG: No results found for this basename: GLUCAP,  in the last 168 hours  Radiological Exams on Admission: Dg Chest 1 View  02/21/2014   CLINICAL DATA:  [place].  EXAM: RIGHT FEMUR - 2 VIEW; CHEST - 1 VIEW; PELVIS - 1-2 VIEW  COMPARISON:  None.  FINDINGS: Pelvis and right femur:  Both hips are normally located. The pubic symphysis and SI joints are intact. No pelvic fractures. There is a mildly displaced intertrochanteric fracture of the right hip with a mild varus deformity. The femur is intact. The knee joint appears normal.  Chest x-ray:  The cardiac silhouette, mediastinal and hilar contours are within normal limits and stable. The lungs are clear. Calcified breast implants are noted. No pleural effusion. The bony thorax is intact.  IMPRESSION: 1. Mildly displaced intertrochanteric fracture of the right hip. 2. No acute cardiopulmonary findings.   Electronically Signed   By: Kalman Jewels M.D.   On: 02/21/2014 23:30   Dg Pelvis 1-2 Views  02/21/2014   CLINICAL DATA:  [place].  EXAM: RIGHT FEMUR - 2 VIEW; CHEST - 1 VIEW; PELVIS - 1-2 VIEW  COMPARISON:  None.  FINDINGS: Pelvis  and right femur:  Both hips are normally located. The pubic symphysis and SI joints are intact. No pelvic fractures. There is a mildly displaced intertrochanteric fracture of the right hip with a mild varus deformity. The femur is intact. The knee joint appears normal.  Chest x-ray:  The cardiac silhouette, mediastinal and hilar contours are within normal limits and stable. The lungs are clear. Calcified breast implants are noted. No pleural effusion. The bony thorax is intact.  IMPRESSION: 1. Mildly displaced intertrochanteric fracture of the right hip. 2. No acute cardiopulmonary findings.   Electronically Signed   By: Kalman Jewels M.D.   On: 02/21/2014 23:30   Dg Femur Right  02/21/2014   CLINICAL DATA:  [place].  EXAM: RIGHT FEMUR - 2 VIEW; CHEST - 1 VIEW; PELVIS - 1-2 VIEW  COMPARISON:  None.  FINDINGS: Pelvis and right femur:  Both hips are normally located. The pubic symphysis and SI joints are intact. No pelvic fractures. There is a mildly displaced intertrochanteric fracture of the right hip with a mild varus deformity. The femur is intact. The knee joint appears normal.  Chest x-ray:  The cardiac silhouette, mediastinal and hilar contours are within normal limits and stable. The lungs are clear. Calcified breast implants are noted. No pleural effusion. The bony thorax is intact.  IMPRESSION: 1. Mildly displaced intertrochanteric fracture of the right hip. 2. No acute cardiopulmonary findings.   Electronically Signed   By: Kalman Jewels M.D.   On: 02/21/2014 23:30    Assessment/Plan Present on Admission:  . Hip fracture requiring operative repair  #1 hip fracture Admit for surgery tomorrow. Morning labs EKG in the morning Given that she has no cardiac history in or cardiac symptoms, and she is low risk for surgery and a good surgical candidate. Medically cleared for surgery.  #2 hypothyroidism Controlled. Continue medications.  #3 GERD Controlled with PPI  Consultants: Orthopedics in  the morning  Code Status: Full code  Family Communication: Daughter   Disposition Plan: Per orthopedic recommendations  Time spent: 50 minutes  [place], [place] [facility] Pager [phone]  **Disclaimer: This note may have been dictated with voice recognition software. Similar sounding words can inadvertently be transcribed and this note may contain transcription errors which may not have been corrected upon publication of note.**

## 2014-02-22 NOTE — Consult Note (Signed)
Reason for Consult:Fracture of right hip Referring Physician: ER  Haley Trevino is an 73 y.o. female.  HPI: She fell last night while looking for her cat outdoors.  She hurt her right hip.  She was brought to the ER and x-rays showed a fracture of the right hip intertrochanteric type.  She had no other injuries.  She has long history of connective tissue disease, scleroderma and significant Raynaud's syndrome of the hands.  I have told her the trauma of the fracture could aggravate her scleroderma.  I have discussed with her and her family the findings of the fracture and the plan for surgery.  I would like to do surgery later today as the schedule permits.  I have explained risks and imponderables including infection, embolus which could cause death, need for physical therapy, possible blood transfusion, and anesthesia risks.  I have recommended consideration of spinal anesthesia.  She will need to strongly consider SNF placement after surgery for rehabilitation.  She and her family asked appropriate questions.  They appear to understand and agree to the procedure for the hip fracture on the right.  Past Medical History  Diagnosis Date  . Raynaud disease   . Scleroderma   . Osteoarthritis   . Dyspnea   . Thyroid disease     hypothyroidism    Past Surgical History  Procedure Laterality Date  . Cholecystectomy  1973  . Total abdominal hysterectomy  1974  . Breast enhancement surgery  1975  . Hand surgery  08/2012; 11/2012  . Melanoma excision      at 65 yrs of age    Family History  Problem Relation Age of Onset  . Pancreatic cancer Father   . Rashes / Skin problems Daughter     scleroderma    Social History:  reports that she has never smoked. She has never used smokeless tobacco. She reports that she does not drink alcohol or use illicit drugs.  Allergies:  Allergies  Allergen Reactions  . Dilaudid [Hydromorphone Hcl] Anaphylaxis  . Codeine     REACTION: dyspnea     Medications: I have reviewed the patient's current medications.  Results for orders placed during the hospital encounter of 02/21/14 (from the past 48 hour(s))  URINALYSIS, ROUTINE W REFLEX MICROSCOPIC     Status: Abnormal   Collection Time    02/21/14 11:30 PM      Result Value Ref Range   Color, Urine STRAW (*) YELLOW   APPearance CLEAR  CLEAR   Specific Gravity, Urine 1.010  1.005 - 1.030   pH 7.5  5.0 - 8.0   Glucose, UA NEGATIVE  NEGATIVE mg/dL   Hgb urine dipstick NEGATIVE  NEGATIVE   Bilirubin Urine NEGATIVE  NEGATIVE   Ketones, ur NEGATIVE  NEGATIVE mg/dL   Protein, ur NEGATIVE  NEGATIVE mg/dL   Urobilinogen, UA 0.2  0.0 - 1.0 mg/dL   Nitrite NEGATIVE  NEGATIVE   Leukocytes, UA NEGATIVE  NEGATIVE   Comment: MICROSCOPIC NOT DONE ON URINES WITH NEGATIVE PROTEIN, BLOOD, LEUKOCYTES, NITRITE, OR GLUCOSE <1000 mg/dL.  BASIC METABOLIC PANEL     Status: Abnormal   Collection Time    02/21/14 11:39 PM      Result Value Ref Range   Sodium 140  137 - 147 mEq/L   Potassium 3.4 (*) 3.7 - 5.3 mEq/L   Chloride 103  96 - 112 mEq/L   CO2 21  19 - 32 mEq/L   Glucose, Bld 127 (*) 70 -  99 mg/dL   BUN 7  6 - 23 mg/dL   Creatinine, Ser 0.72  0.50 - 1.10 mg/dL   Calcium 9.1  8.4 - 10.5 mg/dL   GFR calc non Af Amer 84 (*) >90 mL/min   GFR calc Af Amer >90  >90 mL/min   Comment: (NOTE)     The eGFR has been calculated using the CKD EPI equation.     This calculation has not been validated in all clinical situations.     eGFR's persistently <90 mL/min signify possible Chronic Kidney     Disease.   Anion gap 16 (*) 5 - 15  CBC WITH DIFFERENTIAL     Status: Abnormal   Collection Time    02/21/14 11:39 PM      Result Value Ref Range   WBC 11.9 (*) 4.0 - 10.5 K/uL   RBC 4.43  3.87 - 5.11 MIL/uL   Hemoglobin 12.3  12.0 - 15.0 g/dL   HCT 35.5 (*) 36.0 - 46.0 %   MCV 80.1  78.0 - 100.0 fL   MCH 27.8  26.0 - 34.0 pg   MCHC 34.6  30.0 - 36.0 g/dL   RDW 15.6 (*) 11.5 - 15.5 %    Platelets 338  150 - 400 K/uL   Neutrophils Relative % 84 (*) 43 - 77 %   Neutro Abs 9.9 (*) 1.7 - 7.7 K/uL   Lymphocytes Relative 10 (*) 12 - 46 %   Lymphs Abs 1.2  0.7 - 4.0 K/uL   Monocytes Relative 6  3 - 12 %   Monocytes Absolute 0.7  0.1 - 1.0 K/uL   Eosinophils Relative 0  0 - 5 %   Eosinophils Absolute 0.1  0.0 - 0.7 K/uL   Basophils Relative 0  0 - 1 %   Basophils Absolute 0.0  0.0 - 0.1 K/uL  TYPE AND SCREEN     Status: None   Collection Time    02/21/14 11:40 PM      Result Value Ref Range   ABO/RH(D) O POS     Antibody Screen NEG     Sample Expiration 02/24/2014    PROTIME-INR     Status: None   Collection Time    02/22/14 12:02 AM      Result Value Ref Range   Prothrombin Time 12.7  11.6 - 15.2 seconds   INR 0.95  0.00 - 1.69  BASIC METABOLIC PANEL     Status: Abnormal   Collection Time    02/22/14  5:13 AM      Result Value Ref Range   Sodium 143  137 - 147 mEq/L   Potassium 3.8  3.7 - 5.3 mEq/L   Chloride 106  96 - 112 mEq/L   CO2 25  19 - 32 mEq/L   Glucose, Bld 135 (*) 70 - 99 mg/dL   BUN 7  6 - 23 mg/dL   Creatinine, Ser 0.68  0.50 - 1.10 mg/dL   Calcium 8.7  8.4 - 10.5 mg/dL   GFR calc non Af Amer 85 (*) >90 mL/min   GFR calc Af Amer >90  >90 mL/min   Comment: (NOTE)     The eGFR has been calculated using the CKD EPI equation.     This calculation has not been validated in all clinical situations.     eGFR's persistently <90 mL/min signify possible Chronic Kidney     Disease.   Anion gap 12  5 - 15  CBC     Status: Abnormal   Collection Time    02/22/14  5:13 AM      Result Value Ref Range   WBC 12.2 (*) 4.0 - 10.5 K/uL   RBC 3.91  3.87 - 5.11 MIL/uL   Hemoglobin 11.1 (*) 12.0 - 15.0 g/dL   HCT 32.5 (*) 36.0 - 46.0 %   MCV 83.1  78.0 - 100.0 fL   MCH 28.4  26.0 - 34.0 pg   MCHC 34.2  30.0 - 36.0 g/dL   RDW 15.8 (*) 11.5 - 15.5 %   Platelets 323  150 - 400 K/uL    Dg Chest 1 View  02/21/2014   CLINICAL DATA:  [place].  EXAM: RIGHT FEMUR - 2  VIEW; CHEST - 1 VIEW; PELVIS - 1-2 VIEW  COMPARISON:  None.  FINDINGS: Pelvis and right femur:  Both hips are normally located. The pubic symphysis and SI joints are intact. No pelvic fractures. There is a mildly displaced intertrochanteric fracture of the right hip with a mild varus deformity. The femur is intact. The knee joint appears normal.  Chest x-ray:  The cardiac silhouette, mediastinal and hilar contours are within normal limits and stable. The lungs are clear. Calcified breast implants are noted. No pleural effusion. The bony thorax is intact.  IMPRESSION: 1. Mildly displaced intertrochanteric fracture of the right hip. 2. No acute cardiopulmonary findings.   Electronically Signed   By: Kalman Jewels M.D.   On: 02/21/2014 23:30   Dg Pelvis 1-2 Views  02/21/2014   CLINICAL DATA:  [place].  EXAM: RIGHT FEMUR - 2 VIEW; CHEST - 1 VIEW; PELVIS - 1-2 VIEW  COMPARISON:  None.  FINDINGS: Pelvis and right femur:  Both hips are normally located. The pubic symphysis and SI joints are intact. No pelvic fractures. There is a mildly displaced intertrochanteric fracture of the right hip with a mild varus deformity. The femur is intact. The knee joint appears normal.  Chest x-ray:  The cardiac silhouette, mediastinal and hilar contours are within normal limits and stable. The lungs are clear. Calcified breast implants are noted. No pleural effusion. The bony thorax is intact.  IMPRESSION: 1. Mildly displaced intertrochanteric fracture of the right hip. 2. No acute cardiopulmonary findings.   Electronically Signed   By: Kalman Jewels M.D.   On: 02/21/2014 23:30   Dg Femur Right  02/21/2014   CLINICAL DATA:  [place].  EXAM: RIGHT FEMUR - 2 VIEW; CHEST - 1 VIEW; PELVIS - 1-2 VIEW  COMPARISON:  None.  FINDINGS: Pelvis and right femur:  Both hips are normally located. The pubic symphysis and SI joints are intact. No pelvic fractures. There is a mildly displaced intertrochanteric fracture of the right hip with a mild varus  deformity. The femur is intact. The knee joint appears normal.  Chest x-ray:  The cardiac silhouette, mediastinal and hilar contours are within normal limits and stable. The lungs are clear. Calcified breast implants are noted. No pleural effusion. The bony thorax is intact.  IMPRESSION: 1. Mildly displaced intertrochanteric fracture of the right hip. 2. No acute cardiopulmonary findings.   Electronically Signed   By: Kalman Jewels M.D.   On: 02/21/2014 23:30    Review of Systems  Cardiovascular:       Hypertension and also history of hypotension at times.  Gastrointestinal: Positive for heartburn and nausea.  Musculoskeletal: Positive for falls (Fell last evening and hurt her right hip).       History  of connective tissue disease, scleroderma, and she has bad Raynaud's of the fingers.  She has had sympathetic nerve surgery for her hands.     Blood pressure 141/53, pulse 77, temperature 99.3 F (37.4 C), temperature source Oral, resp. rate 20, height _0  (1.575 m), weight 55.36 kg (122 lb 0.8 oz), SpO2 91.00%. Physical Exam  Constitutional: She is oriented to person, place, and time. She appears well-developed and well-nourished.  HENT:  Head: Normocephalic and atraumatic.  Eyes: Conjunctivae are normal. Pupils are equal, round, and reactive to light.  Neck: Normal range of motion. Neck supple.  Cardiovascular: Normal rate, regular rhythm and intact distal pulses.   Respiratory: Effort normal.  GI: Soft.  Musculoskeletal: She exhibits tenderness (Pain of the right hip with movement.  No significant shortening or rotation changes.  NV intact.  No color changes or defects of fingers or toes apparent this am.).  Neurological: She is alert and oriented to person, place, and time. She has normal reflexes.  Skin: Skin is warm and dry.  Psychiatric: She has a normal mood and affect. Her behavior is normal. Judgment and thought content normal.    Assessment/Plan: Intertrochanteric fracture of  the right hip.  For surgical fixation later today. Scleroderma and [name]/syndrome.  Winter Jocelyn 02/22/2014, 7:45 AM

## 2014-02-22 NOTE — Progress Notes (Signed)
UR chart review completed.  

## 2014-02-22 NOTE — Brief Op Note (Signed)
02/21/2014 - 02/22/2014  1:52 PM  PATIENT:  [name]  73 y.o. female  PRE-OPERATIVE DIAGNOSIS:  Fracture Right Hip Intertrochanteric type  POST-OPERATIVE DIAGNOSIS:  Fracture Right Hip  PROCEDURE:  Procedure(s): OPEN REDUCTION INTERNAL FIXATION RIGHT HIP (Right)  SURGEON:  Surgeon(s) and Role:    * Sanjuana Kava, MD - Primary  PHYSICIAN ASSISTANT:   ASSISTANTS: D. [place], RN   ANESTHESIA:   spinal  EBL:  Total I/O In: 600 [I.V.:600] Out: 675 [Urine:625; Blood:50]  BLOOD ADMINISTERED:none  DRAINS: (Large) Hemovact drain(s) in the right hip area with  Suction Open   LOCAL MEDICATIONS USED:  NONE  SPECIMEN:  No Specimen  DISPOSITION OF SPECIMEN:  N/A  COUNTS:  YES  TOURNIQUET:  * No tourniquets in log *  DICTATION: .Other Dictation: Dictation Number [id number]  PLAN OF CARE: Admit to inpatient   PATIENT DISPOSITION:  PACU - hemodynamically stable.   Delay start of Pharmacological VTE agent (>24hrs) due to surgical blood loss or risk of bleeding: no

## 2014-02-23 ENCOUNTER — Encounter (HOSPITAL_COMMUNITY): Payer: Self-pay | Admitting: Orthopaedic Surgery

## 2014-02-23 LAB — CBC WITH DIFFERENTIAL/PLATELET
Basophils Absolute: 0 10*3/uL (ref 0.0–0.1)
Basophils Relative: 0 % (ref 0–1)
EOS ABS: 0 10*3/uL (ref 0.0–0.7)
Eosinophils Relative: 0 % (ref 0–5)
HCT: 31.9 % — ABNORMAL LOW (ref 36.0–46.0)
HEMOGLOBIN: 10.6 g/dL — AB (ref 12.0–15.0)
LYMPHS ABS: 1 10*3/uL (ref 0.7–4.0)
LYMPHS PCT: 13 % (ref 12–46)
MCH: 28.5 pg (ref 26.0–34.0)
MCHC: 33.2 g/dL (ref 30.0–36.0)
MCV: 85.8 fL (ref 78.0–100.0)
MONOS PCT: 10 % (ref 3–12)
Monocytes Absolute: 0.8 10*3/uL (ref 0.1–1.0)
NEUTROS PCT: 77 % (ref 43–77)
Neutro Abs: 6 10*3/uL (ref 1.7–7.7)
PLATELETS: 260 10*3/uL (ref 150–400)
RBC: 3.72 MIL/uL — AB (ref 3.87–5.11)
RDW: 16.1 % — ABNORMAL HIGH (ref 11.5–15.5)
WBC: 7.9 10*3/uL (ref 4.0–10.5)

## 2014-02-23 LAB — BASIC METABOLIC PANEL
Anion gap: 9 (ref 5–15)
BUN: 9 mg/dL (ref 6–23)
CO2: 28 mEq/L (ref 19–32)
Calcium: 8.5 mg/dL (ref 8.4–10.5)
Chloride: 104 mEq/L (ref 96–112)
Creatinine, Ser: 0.77 mg/dL (ref 0.50–1.10)
GFR, EST NON AFRICAN AMERICAN: 82 mL/min — AB (ref >90)
GLUCOSE: 118 mg/dL — AB (ref 70–99)
POTASSIUM: 4.7 meq/L (ref 3.7–5.3)
SODIUM: 141 meq/L (ref 137–147)

## 2014-02-23 MED ORDER — ALBUTEROL SULFATE (2.5 MG/3ML) 0.083% IN NEBU: 2.5000 mg | INHALATION_SOLUTION | Freq: Four times a day (QID) | RESPIRATORY_TRACT | Status: DC | PRN

## 2014-02-23 NOTE — Progress Notes (Signed)
  PROGRESS NOTE  Haley Trevino:[id number] DOB: 01-13-1941 DOA: 02/21/2014 PCP: Redge Gainer, MD  Summary: 73 year old woman admitted for hip fracture status post mechanical fall at home.  Assessment/Plan: 1. Right hip fracture status post mechanical fall. Status postoperative repair 8/19. Therapy is recommended skilled nursing facility. 2. History of Raynaud disease, scleroderma, hypothyroidism   Continue postoperative management per orthopedics. Continue therapy.  Bowel regimen   Anticipate transfer to skilled nursing facility next 48 hours.  Discussed with husband, daughter at bedside  Code Status: full code DVT prophylaxis: Lovenox Family Communication: none present Disposition Plan: pending therapy evaluations  Murray Hodgkins, MD  [facility]  Pager [phone] If 7PM-7AM, please contact night-coverage at [email or web address], password [facility] 02/23/2014, 11:06 AM  LOS: 2 days   Consultants:  orthopedics  Procedures:  8/19 OPEN REDUCTION INTERNAL FIXATION RIGHT HIP (Right)  HPI/Subjective: Pain seems to be controlled with Percocet.  Objective: Filed Vitals:   02/23/14 0241 02/23/14 0550 02/23/14 0636 02/23/14 0826  BP: 142/57 120/51    Pulse: 68 66    Temp: 99 F (37.2 C) 98.5 F (36.9 C)    TempSrc: Oral Oral    Resp: 20 20    Height:      Weight:      SpO2: 92% 92% 94% 94%    Intake/Output Summary (Last 24 hours) at 02/23/14 1106 Last data filed at 02/23/14 0920  Gross per 24 hour  Intake    600 ml  Output   1255 ml  Net   -655 ml     Filed Weights   02/21/14 2236 02/22/14 0119 02/22/14 1124  Weight: 55.339 kg (122 lb) 55.36 kg (122 lb 0.8 oz) 55.339 kg (122 lb)    Exam:     Afebrile, vitals stable.  Gen. Appears calm and comfortable.  Cardiovascular regular rate and rhythm. No murmur, rub or gallop.  Respiratory clear to auscultation bilaterally. No wheezes, rales or rhonchi. Normal respiratory effort.  Psychiatric. Grossly  normal mood and affect. Speech fluent and appropriate.  Data Reviewed:  Chemistry: Basic metabolic panel unremarkable.  Heme: CBC stable. Hemoglobin stable 10.6.  Scheduled Meds: . enoxaparin (LOVENOX) injection  40 mg Subcutaneous Q24H  . FLUoxetine  20 mg Oral QODAY  . levothyroxine  50 mcg Oral QAC breakfast  . NIFEdipine  90 mg Oral Daily  . pantoprazole  40 mg Oral Daily  . sildenafil  20 mg Oral BID   Continuous Infusions:    Principal Problem:   Hip fracture requiring operative repair Active Problems:   [name]   SCLERODERMA   Time spent 15 minutes

## 2014-02-23 NOTE — Clinical Social Work Psychosocial (Signed)
Clinical Social Work Department BRIEF PSYCHOSOCIAL ASSESSMENT 02/23/2014  Patient:  Haley Trevino, Haley Trevino     Account Number:  [id number]     Admit date:  02/21/2014  Clinical Social Worker:  Wyatt Haste  Date/Time:  02/23/2014 10:55 AM  Referred by:  CSW  Date Referred:  02/23/2014 Referred for  SNF Placement   Other Referral:   Interview type:  Patient Other interview type:   husband and daughter    PSYCHOSOCIAL DATA Living Status:  HUSBAND Admitted from facility:   Level of care:   Primary support name:  Haley Trevino Primary support relationship to patient:  SPOUSE Degree of support available:   supportive    CURRENT CONCERNS Current Concerns  Post-Acute Placement   Other Concerns:    SOCIAL WORK ASSESSMENT / PLAN CSW met with pt and pt's husband and daughter at bedside. Pt alert and oriented and reports she just finished working with PT. Pt had surgery yesterday after she fell in her yard, fracturing her hip. Her husband and daughter appear to be very involved and supportive. Pt was completely independent prior to fall and was still driving. She indicates understanding of PT recommendation for SNF. Pt appears to be very open to idea as she feels it will be very short term. Her husband is able to assist pt at home as soon as she is able to return. CSW discussed placement process and Medicare coverage/criteria. Pt requests [place], or [place]. She lives in [place] and her daughter lives in [place] so pt feels these would be convenient options. SNF list provided. CSW will initiate bed search and follow up with bed offers when available.   Assessment/plan status:  Psychosocial Support/Ongoing Assessment of Needs Other assessment/ plan:   Information/referral to community resources:   SNF list    PATIENT'S/FAMILY'S RESPONSE TO PLAN OF CARE: Pt recognizes benefit of short term SNF prior to return home and is agreeable. Family also supportive of  plan.       Haley Trevino, Bent

## 2014-02-23 NOTE — Op Note (Signed)
Haley Trevino, Haley Trevino            ACCOUNT NO.:  [id number]  MEDICAL RECORD NO.:  [id number]  LOCATION:  A301                          FACILITY:  [facility]  PHYSICIAN:  J. Sanjuana Kava, M.D. DATE OF BIRTH:  [birth year]  DATE OF PROCEDURE:  02/22/2014 DATE OF DISCHARGE:                              OPERATIVE REPORT   PREOPERATIVE DIAGNOSIS:  Intertrochanteric fracture of the hip on the right.  POSTOPERATIVE DIAGNOSIS:  Intertrochanteric fracture of the hip on the right.  PROCEDURE:  Open treatment and internal reduction of right hip intertrochanteric fracture using a [employer] hip compression system with a 95 mm compression screw and 135-degree angle short barrel side plate with screws measuring from 40 mm to 38 mm.  DRAINS:  One large Hemovac drain.  ANESTHESIA:  Spinal.  SURGEON:  J. Sanjuana Kava, M.D.  ASSISTANT:  Hinda Glatter, RN.  INDICATIONS:  The patient fell last evening outside of her home while looking for her cat.  She injured her right hip.  X-rays in the ER revealed intertrochanteric fracture of the hip.  The patient has preexisting scleroderma and significant Reynaud's disease and syndrome. She is being treated for connective tissue disease elsewhere.  She had no other injury.  I discussed risks and imponderables with the patient and her family.  I explained about the procedure and they appeared to understand and agreed to procedure as outlined.  DESCRIPTION OF PROCEDURE:  The patient was seen in the holding area and a mark was placed on the right hip area.  She was brought to the operating room and given spinal anesthesia.  She was transferred to the fracture table.  The C-arm fluoroscopy unit was brought in, everyone on had apron shields and x-ray badges.  Fracture was in good position and alignment after reduction on the fracture table and the patient was prepped and draped in the usual manner.  We had a time-out identifying the patient as Ms.  Brendel, we are doing her right hip for intertrochanteric fracture of the hip.  All instrumentations were properly positioned and working.  The C-arm was working well. Everyone had on appropriate shielding for radiation. Everyone in the room knew each other.  Incision was made through skin, subcutaneous tissue, tensor fascia lata, and vastus lateralis.  A guide pin was placed with good AP and lateral views.  I measured it close to 100 mm, therefore 95 mm compression screw was selected.  Step drill was used and was drilled.  A 135-degree short barrel side plate was then selected and 4 drill holes were made under compression and this measured from 40 mm to 38 mm.  X-ray showed reduction of the fracture and good position and placement of plate and screws.  Hemovac drain was placed, sewn in with 2-0 silk suture.  The vastus lateralis reapproximated with a running locking #1 Bralon suture, tensor fascia lata reapproximated using interrupted figure-of-eight #1 Bralon suture.  Subcutaneous tissue was closed with 2-0 plain and skin reapproximated with skin staples.  Sterile dressing applied.  The patient tolerated the procedure well and will go to the recovery in good condition.  She is admitted of course.  ______________________________ Lenna Sciara. Sanjuana Kava, M.D.     JWK/MEDQ  D:  02/22/2014  T:  02/22/2014  Job:  [id number]

## 2014-02-23 NOTE — Addendum Note (Signed)
Addendum created 02/23/14 0751 by Mickel Baas, CRNA   Modules edited: Notes Section   Notes Section:  File: [id number]

## 2014-02-23 NOTE — Anesthesia Postprocedure Evaluation (Signed)
  Anesthesia Post-op Note  Patient: Haley Trevino  Procedure(s) Performed: Procedure(s): OPEN REDUCTION INTERNAL FIXATION RIGHT HIP (Right)  Patient Location: Room 301  Anesthesia Type:Spinal  Level of Consciousness: awake, alert , oriented and patient cooperative  Airway and Oxygen Therapy: Patient Spontanous Breathing and Patient connected to nasal cannula oxygen  Post-op Pain: moderate  Post-op Assessment: Post-op Vital signs reviewed, Patient's Cardiovascular Status Stable, Respiratory Function Stable, Patent Airway, No signs of Nausea or vomiting and Pain level controlled  Post-op Vital Signs: Reviewed and stable  Last Vitals:  Filed Vitals:   02/23/14 0550  BP: 120/51  Pulse: 66  Temp: 36.9 C  Resp: 20    Complications: No apparent anesthesia complications

## 2014-02-23 NOTE — Progress Notes (Signed)
Subjective: 1 Day Post-Op Procedure(s) (LRB): OPEN REDUCTION INTERNAL FIXATION RIGHT HIP (Right) Patient reports pain as 5 on 0-10 scale.    Objective: Vital signs in last 24 hours: Temp:  [98.5 F (36.9 C)-99.3 F (37.4 C)] 98.5 F (36.9 C) (08/20 0550) Pulse Rate:  [63-100] 66 (08/20 0550) Resp:  [14-28] 20 (08/20 0550) BP: (114-177)/(51-70) 120/51 mmHg (08/20 0550) SpO2:  [90 %-98 %] 94 % (08/20 0636) Weight:  [55.339 kg (122 lb)] 55.339 kg (122 lb) (08/19 1124)  Intake/Output from previous day: 08/19 0701 - 08/20 0700 In: 600 [I.V.:600] Out: 1210 [Urine:1125; Drains:35; Blood:50] Intake/Output this shift:     Recent Labs  02/21/14 2339 02/22/14 0513 02/23/14 0513  HGB 12.3 11.1* 10.6*    Recent Labs  02/22/14 0513 02/23/14 0513  WBC 12.2* 7.9  RBC 3.91 3.72*  HCT 32.5* 31.9*  PLT 323 260    Recent Labs  02/22/14 0513 02/23/14 0513  NA 143 141  K 3.8 4.7  CL 106 104  CO2 25 28  BUN 7 9  CREATININE 0.68 0.77  GLUCOSE 135* 118*  CALCIUM 8.7 8.5    Recent Labs  02/22/14 0002  INR 0.95    Neurologically intact Neurovascular intact Sensation intact distally Intact pulses distally Dorsiflexion/Plantar flexion intact  She had a good night.  She is ready to begin PT.  Assessment/Plan: 1 Day Post-Op Procedure(s) (LRB): OPEN REDUCTION INTERNAL FIXATION RIGHT HIP (Right) Up with therapy  Haley Trevino 02/23/2014, 7:19 AM

## 2014-02-23 NOTE — Evaluation (Signed)
Physical Therapy Evaluation Patient Details Name: Haley Trevino MRN: [id number] DOB: 04/16/1941 Today's Date: 02/23/2014   History of Present Illness  Haley Trevino is a 73 y.o. female  with a history of scleroderma and secondary amounts disease, hypothyroidism, depression, hypertension who presents to the emergency department after a fall approximately 9:30 PM. She was outside looking for her cat and tripped over a railroad tie, landing on her right hip. She had intense pain. She was found by her husband 30 minutes later who helped her to [place] and then called EMS. She was brought to the ER and found to have a right femoral neck fracture fracture. Pain has been fairly well controlled fentanyl. Movement exacerbates the pain. Orthopedics was consulted. They asked that I admit the patient and perforation for surgery tomorrow.   pt underwent ORIF secondary to intertrochanteric fx on the Rt on 02/22/14.   Clinical Impression  Pt is a 73 year old female who presents to PT after undergoing ORIF on the Rt hip secondary to intertrochanteric fracture on 02/22/14.  Called MD office with orders from MD [facility] for TTWB on Rt LE.  During evaluation, pt required min/mod assist for bed mobility skills and min assist and use of RW to stand.  Educated pt on WB precautions, and pt able to follow precautions in standing.  Pt reports increased symptoms of nausea/dizziness after transfer to EOB and after standing requiring rest breaks to decrease symptoms.  Unable to attempt gait today secondary to complaints of dizziness when in standing.  Recommend continued PT to address strengthening, ROM, activity tolerance, and pain management for improvement of functional mobility skills with transition to SNF for continued rehab.  Recommend use of RW during gait, though will defer to SNF for DME needs.     Follow Up Recommendations SNF    Equipment Recommendations  None recommended by PT (Will defer to SNF)        Precautions / Restrictions Precautions Precautions: Fall Precaution Comments: Reynauds Restrictions Weight Bearing Restrictions: Yes Other Position/Activity Restrictions: TTWB      Mobility  Bed Mobility Overal bed mobility: Needs Assistance Bed Mobility: Supine to Sit;Sit to Supine     Supine to sit: Min assist (Min assist for trunk and Rt LE) Sit to supine: Mod assist;Min assist (Min assist for trunk, mod assist for LE)   General bed mobility comments: Seated rest break required after transfer to EOB secondary to dizziness/nausea  Transfers Overall transfer level: Needs assistance Equipment used: Rolling walker (2 wheeled) Transfers: Sit to/from Stand Sit to Stand: Min assist         General transfer comment: Standing rest break required after transfer to standing secondary to dizziness/nausea.  Pt able to maintain TTWB precautions.   Ambulation/Gait             General Gait Details: Unable to attempt secondary to dizziness/nausea when in standing.      Balance Overall balance assessment: History of Falls;Needs assistance Sitting-balance support: Feet supported;Bilateral upper extremity supported Sitting balance-Leahy Scale: Good     Standing balance support: Bilateral upper extremity supported;During functional activity Standing balance-Leahy Scale: Fair                               Pertinent Vitals/Pain Pain Assessment: 0-10 Pain Score: 8  Pain Location: Rt hip Pain Descriptors / Indicators: Aching;Sore;Throbbing Pain Intervention(s): Limited activity within patient's tolerance;RN gave pain meds during session;Ice applied  Home Living Family/patient expects to be discharged to:: Skilled nursing facility Living Arrangements: Spouse/significant other Available Help at Discharge: Family;Available 24 hours/day Type of Home: House (Split level home) Home Access: Stairs to enter Entrance Stairs-Rails: Left Entrance Stairs-Number of  Steps: 6 stairs to den, 6 stairs to bedroom Home Layout: Multi-level Home Equipment: Cane - single point;Grab bars - tub/shower Additional Comments: Tub shower, grab bars in upstairs shower only    Prior Function Level of Independence: Independent               Hand Dominance   Dominant Hand: Right    Extremity/Trunk Assessment               Lower Extremity Assessment: Generalized weakness;RLE deficits/detail RLE Deficits / Details: Rt hip NT secondary to recent surgical procedure       Communication   Communication: No difficulties  Cognition Arousal/Alertness: Awake/alert Behavior During Therapy: WFL for tasks assessed/performed Overall Cognitive Status: Within Functional Limits for tasks assessed                               Assessment/Plan    PT Assessment Patient needs continued PT services  PT Diagnosis Difficulty walking;Generalized weakness;Acute pain   PT Problem List Decreased strength;Decreased knowledge of use of DME;Decreased activity tolerance;Decreased safety awareness;Decreased balance;Decreased mobility;Decreased knowledge of precautions;Pain  PT Treatment Interventions DME instruction;Balance training;Gait training;Neuromuscular re-education;Functional mobility training;Therapeutic activities;Therapeutic exercise;Patient/family education;Stair training   PT Goals (Current goals can be found in the Care Plan section) Acute Rehab PT Goals Patient Stated Goal: get better PT Goal Formulation: With patient/family Time For Goal Achievement: 03/09/14 Potential to Achieve Goals: Good    Frequency 7X/week   Barriers to discharge Inaccessible home environment         End of Session Equipment Utilized During Treatment: Gait belt;Oxygen Activity Tolerance: Patient limited by fatigue Patient left: in bed;with call bell/phone within reach;with bed alarm set;with family/visitor present           Time: 2831-5176 PT Time Calculation  (min): 47 min   Charges:   PT Evaluation $Initial PT Evaluation Tier I: 1 Procedure PT Treatments $Self Care/Home Management: 8-22 (Reviewed WB precautions, expectations with movement/PT, recommendations for placement)        Shatyra Becka 02/23/2014, 10:27 AM

## 2014-02-23 NOTE — Clinical Social Work Placement (Signed)
Clinical Social Work Department CLINICAL SOCIAL WORK PLACEMENT NOTE 02/23/2014  Patient:  [name], Haley Trevino  Account Number:  [id number] Admit date:  02/21/2014  Clinical Social Worker:  Benay Pike, LCSW  Date/time:  02/23/2014 10:54 AM  Clinical Social Work is seeking post-discharge placement for this patient at the following level of care:   [place]   (*CSW will update this form in Epic as items are completed)   02/23/2014  Patient/family provided with [place] Department of Clinical Social Work's list of facilities offering this level of care within the geographic area requested by the patient (or if unable, by the patient's family).  02/23/2014  Patient/family informed of their freedom to choose among providers that offer the needed level of care, that participate in Medicare, Medicaid or managed care program needed by the patient, have an available bed and are willing to accept the patient.  02/23/2014  Patient/family informed of [facility]' ownership interest in [facility], as well as of the fact that they are under no obligation to receive care at this facility.  PASARR submitted to EDS on 02/23/2014 PASARR number received on 02/23/2014  FL2 transmitted to all facilities in geographic area requested by pt/family on  02/23/2014 FL2 transmitted to all facilities within larger geographic area on   Patient informed that his/her managed care company has contracts with or will negotiate with  certain facilities, including the following:     Patient/family informed of bed offers received:   Patient chooses bed at  Physician recommends and patient chooses bed at    Patient to be transferred to  on   Patient to be transferred to facility by  Patient and family notified of transfer on  Name of family member notified:    The following physician request were entered in Epic:   Additional Comments:  Benay Pike, [place]

## 2014-02-24 ENCOUNTER — Encounter (HOSPITAL_COMMUNITY): Payer: Self-pay | Admitting: *Deleted

## 2014-02-24 LAB — BASIC METABOLIC PANEL
Anion gap: 11 (ref 5–15)
BUN: 9 mg/dL (ref 6–23)
CHLORIDE: 100 meq/L (ref 96–112)
CO2: 27 meq/L (ref 19–32)
Calcium: 8.4 mg/dL (ref 8.4–10.5)
Creatinine, Ser: 0.7 mg/dL (ref 0.50–1.10)
GFR calc Af Amer: >90 mL/min (ref >90)
GFR calc non Af Amer: 85 mL/min — ABNORMAL LOW (ref >90)
GLUCOSE: 120 mg/dL — AB (ref 70–99)
POTASSIUM: 4 meq/L (ref 3.7–5.3)
SODIUM: 138 meq/L (ref 137–147)

## 2014-02-24 LAB — CBC WITH DIFFERENTIAL/PLATELET
Basophils Absolute: 0 10*3/uL (ref 0.0–0.1)
Basophils Relative: 0 % (ref 0–1)
EOS ABS: 0.1 10*3/uL (ref 0.0–0.7)
EOS PCT: 1 % (ref 0–5)
HEMATOCRIT: 30 % — AB (ref 36.0–46.0)
HEMOGLOBIN: 10.2 g/dL — AB (ref 12.0–15.0)
LYMPHS ABS: 1 10*3/uL (ref 0.7–4.0)
Lymphocytes Relative: 11 % — ABNORMAL LOW (ref 12–46)
MCH: 28.7 pg (ref 26.0–34.0)
MCHC: 34 g/dL (ref 30.0–36.0)
MCV: 84.3 fL (ref 78.0–100.0)
MONOS PCT: 9 % (ref 3–12)
Monocytes Absolute: 0.8 10*3/uL (ref 0.1–1.0)
Neutro Abs: 7.1 10*3/uL (ref 1.7–7.7)
Neutrophils Relative %: 79 % — ABNORMAL HIGH (ref 43–77)
Platelets: 274 10*3/uL (ref 150–400)
RBC: 3.56 MIL/uL — AB (ref 3.87–5.11)
RDW: 15.7 % — ABNORMAL HIGH (ref 11.5–15.5)
WBC: 9 10*3/uL (ref 4.0–10.5)

## 2014-02-24 MED ORDER — FLUOXETINE HCL 20 MG PO CAPS
20.0000 mg | ORAL_CAPSULE | ORAL | Status: DC
  Administered 2014-02-25: 20 mg via ORAL
  Filled 2014-02-24: qty 1

## 2014-02-24 NOTE — Plan of Care (Signed)
Problem: Phase III Progression Outcomes Goal: Discharge plan remains appropriate-arrangements made Outcome: Completed/Met Date Met:  02/24/14 Pt to d/c to [facility] for Rehab tomorrow (planned discharge date).

## 2014-02-24 NOTE — Progress Notes (Signed)
Physical Therapy Treatment Patient Details Name: Haley Trevino MRN: [id number] DOB: Nov 09, 1940 Today's Date: 02/24/2014    History of Present Illness Haley Trevino is a 73 y.o. female  with a history of scleroderma and secondary amounts disease, hypothyroidism, depression, hypertension who presents to the emergency department after a fall approximately 9:30 PM. She was outside looking for her cat and tripped over a railroad tie, landing on her right hip. She had intense pain. She was found by her husband 30 minutes later who helped her to [place] and then called EMS. She was brought to the ER and found to have a right femoral neck fracture fracture. Pain has been fairly well controlled fentanyl. Movement exacerbates the pain. Orthopedics was consulted. They asked that I admit the patient and perforation for surgery tomorrow.   pt underwent ORIF secondary to intertrochanteric fx on the Rt on 02/22/14.     PT Comments    Pt reports moderate levels of pain, and RN in room giving pain medication when PT came in for treatment.  Pt reports she has been sitting up in bed more yesterday and today to build up activity tolerance and decrease dizziness with movement.  Pt able to complete supine exercises, with noted decreased AROM with heel slides on the Rt LE.  Noted improved speed with bed mobility skills and transfers, with decreased rest breaks between activities as dizziness is decreased today.  Pt able to stand pivot transfer to chair with min assist for movement of RW; pt able to maintain TTWB restrictions on Rt side after VC for technique.  Pt reports increased fatigue after transfer, and unable to assess amb secondary to fatigue/dizziness.  Recommend continued PT to address strengthening, ROM, activity tolerance, and pain management for improved functional mobility skills with transition to SNF.   Follow Up Recommendations  SNF     Equipment Recommendations  None recommended by PT        Precautions / Restrictions Precautions Precautions: Fall Precaution Comments: Reynauds Restrictions Weight Bearing Restrictions: Yes Other Position/Activity Restrictions: TTWB    Mobility  Bed Mobility Overal bed mobility: Needs Assistance Bed Mobility: Supine to Sit     Supine to sit: HOB elevated;Min guard;Min assist        Transfers Overall transfer level: Needs assistance Equipment used: Rolling walker (2 wheeled) Transfers: Sit to/from [facility] Sit to Stand: Min assist;Min guard Stand pivot transfers: Min assist (for movement of RW)       General transfer comment: VC for technique for stand pivot transfer to chair  Ambulation/Gait             General Gait Details: Unable to attempt secondary to dizziness/nausea when in standing.          Cognition Arousal/Alertness: Awake/alert Behavior During Therapy: WFL for tasks assessed/performed Overall Cognitive Status: Within Functional Limits for tasks assessed                      Exercises General Exercises - Lower Extremity Ankle Circles/Pumps: Both;10 reps;AROM;Supine Heel Slides: Both;10 reps;AROM;Supine        Pertinent Vitals/Pain Pain Assessment: 0-10 Pain Score: 6  Pain Location: Rt hip Pain Descriptors / Indicators: Tender;Aching Pain Intervention(s): Limited activity within patient's tolerance;RN gave pain meds during session;Ice applied           PT Goals (current goals can now be found in the care plan section) Progress towards PT goals: Progressing toward goals    Frequency  7X/week    PT Plan Current plan remains appropriate       End of Session Equipment Utilized During Treatment: Gait belt Activity Tolerance: Patient limited by fatigue Patient left: in chair;with call bell/phone within reach;with chair alarm set;with family/visitor present     Time: 5284-1324 PT Time Calculation (min): 21 min  Charges:  $Therapeutic Activity: 8-22 mins                     Arbutus Nelligan 02/24/2014, 9:39 AM

## 2014-02-24 NOTE — Progress Notes (Signed)
  PROGRESS NOTE  ALEIGHNA WOJTAS [id number]:[id number] DOB: [birth year] DOA: 02/21/2014 PCP: Redge Gainer, MD  Summary: 73 year old woman admitted for hip fracture status post mechanical fall at home.  Assessment/Plan: 1. Right hip fracture status post mechanical fall. Status postoperative repair 8/19. Doing well. 2. History of Raynaud disease, scleroderma, hypothyroidism   Continue postoperative management per orthopedics.   Continued bowel regimen.  Anticipate transfer to skilled nursing facility 8/22.  Discussed with husband at bedside  Code Status: full code DVT prophylaxis: Lovenox Family Communication:  Disposition Plan: SNF  Murray Hodgkins, MD  [facility]  Pager [phone] If 7PM-7AM, please contact night-coverage at [email or web address], password [facility] 02/24/2014, 5:40 PM  LOS: 3 days   Consultants:  orthopedics  Procedures:  8/19 OPEN REDUCTION INTERNAL FIXATION RIGHT HIP (Right)  HPI/Subjective: Pain well controlled. Doing well. Looking forward to going to rehabilitation.  Objective: Filed Vitals:   02/23/14 2108 02/24/14 0528 02/24/14 1437 02/24/14 1520  BP: 120/61 130/66 112/58   Pulse: 87 79 80   Temp: 100.9 F (38.3 C) 99.8 F (37.7 C) 99.1 F (37.3 C) 99.7 F (37.6 C)  TempSrc: Oral Oral Oral Oral  Resp: 18 18 18    Height:      Weight:      SpO2: 90% 90% 90%     Intake/Output Summary (Last 24 hours) at 02/24/14 1740 Last data filed at 02/24/14 1737  Gross per 24 hour  Intake    670 ml  Output    620 ml  Net     50 ml     Filed Weights   02/21/14 2236 02/22/14 0119 02/22/14 1124  Weight: 55.339 kg (122 lb) 55.36 kg (122 lb 0.8 oz) 55.339 kg (122 lb)    Exam:     Afebrile, vitals stable. No hypoxia.  Gen. Appears calm, comfortable. Speech fluent and clear.  Respiratory clear to auscultation bilaterally. No wheezes, rales or rhonchi. Normal respiratory effort.  Cardiovascular regular rate and rhythm. No murmur, rub or gallop.  No  Data Reviewed:  Basic metabolic panel unremarkable.  Hemoglobin stable 10.2.  Scheduled Meds: . enoxaparin (LOVENOX) injection  40 mg Subcutaneous Q24H  . [START ON 02/25/2014] FLUoxetine  20 mg Oral QODAY  . levothyroxine  50 mcg Oral QAC breakfast  . NIFEdipine  90 mg Oral Daily  . pantoprazole  40 mg Oral Daily  . sildenafil  20 mg Oral BID   Continuous Infusions:    Principal Problem:   Hip fracture requiring operative repair Active Problems:   [name]   SCLERODERMA   Time spent 15 minutes

## 2014-02-24 NOTE — Clinical Social Work Note (Signed)
CSW presented bed offer at [facility] which was pt's preference and she accepts. Husband also in room. Facility notified of planned d/c for tomorrow per MD.   Benay Pike, [place]

## 2014-02-24 NOTE — Plan of Care (Signed)
Problem: Phase I Progression Outcomes Goal: Post op pain controlled with appropriate interventions Outcome: Completed/Met Date Met:  02/24/14 Pain controlled with PO Vicodin at this time. Goal: Post op clear liquids, advance diet as tolerated Outcome: Completed/Met Date Met:  02/24/14 Pt tolerating regular diet at this time.  Problem: Phase II Progression Outcomes Goal: Bed to chair Outcome: Completed/Met Date Met:  02/24/14 Pt OOB to chair today with PT.

## 2014-02-24 NOTE — Progress Notes (Signed)
Patient ID: Haley Trevino, female   DOB: Dec 29, 1940, 73 y.o.   MRN: [id number] Unfinished note from 8-21  Vitals were stable  Drain removed   Wound was clean  D/c saturday

## 2014-02-24 NOTE — Clinical Social Work Placement (Signed)
Clinical Social Work Department CLINICAL SOCIAL WORK PLACEMENT NOTE 02/24/2014  Patient:  EVEA, SHEEK  Account Number:  [id number] Admit date:  02/21/2014  Clinical Social Worker:  Benay Pike, LCSW  Date/time:  02/23/2014 10:54 AM  Clinical Social Work is seeking post-discharge placement for this patient at the following level of care:   [place]   (*CSW will update this form in Epic as items are completed)   02/23/2014  Patient/family provided with [place] Department of Clinical Social Work's list of facilities offering this level of care within the geographic area requested by the patient (or if unable, by the patient's family).  02/23/2014  Patient/family informed of their freedom to choose among providers that offer the needed level of care, that participate in Medicare, Medicaid or managed care program needed by the patient, have an available bed and are willing to accept the patient.  02/23/2014  Patient/family informed of [facility]' ownership interest in [facility], as well as of the fact that they are under no obligation to receive care at this facility.  PASARR submitted to EDS on 02/23/2014 PASARR number received on 02/23/2014  FL2 transmitted to all facilities in geographic area requested by pt/family on  02/23/2014 FL2 transmitted to all facilities within larger geographic area on   Patient informed that his/her managed care company has contracts with or will negotiate with  certain facilities, including the following:     Patient/family informed of bed offers received:  02/24/2014 Patient chooses bed at [place] Physician recommends and patient chooses bed at  [facility], [facility]  Patient to be transferred to  on   Patient to be transferred to facility by  Patient and family notified of transfer on  Name of family member notified:    The following physician request were entered in Epic:   Additional  Comments:  Benay Pike, [place]

## 2014-02-25 DIAGNOSIS — F329 Major depressive disorder, single episode, unspecified: Secondary | ICD-10-CM | POA: Diagnosis not present

## 2014-02-25 DIAGNOSIS — R279 Unspecified lack of coordination: Secondary | ICD-10-CM | POA: Diagnosis not present

## 2014-02-25 DIAGNOSIS — S72009D Fracture of unspecified part of neck of unspecified femur, subsequent encounter for closed fracture with routine healing: Secondary | ICD-10-CM | POA: Diagnosis not present

## 2014-02-25 DIAGNOSIS — D62 Acute posthemorrhagic anemia: Secondary | ICD-10-CM | POA: Diagnosis not present

## 2014-02-25 DIAGNOSIS — S72009A Fracture of unspecified part of neck of unspecified femur, initial encounter for closed fracture: Secondary | ICD-10-CM | POA: Diagnosis not present

## 2014-02-25 DIAGNOSIS — I10 Essential (primary) hypertension: Secondary | ICD-10-CM | POA: Diagnosis not present

## 2014-02-25 DIAGNOSIS — I7300 Raynaud's syndrome without gangrene: Secondary | ICD-10-CM | POA: Diagnosis not present

## 2014-02-25 DIAGNOSIS — M6281 Muscle weakness (generalized): Secondary | ICD-10-CM | POA: Diagnosis not present

## 2014-02-25 DIAGNOSIS — M349 Systemic sclerosis, unspecified: Secondary | ICD-10-CM | POA: Diagnosis not present

## 2014-02-25 DIAGNOSIS — R262 Difficulty in walking, not elsewhere classified: Secondary | ICD-10-CM | POA: Diagnosis not present

## 2014-02-25 DIAGNOSIS — Z09 Encounter for follow-up examination after completed treatment for conditions other than malignant neoplasm: Secondary | ICD-10-CM | POA: Diagnosis not present

## 2014-02-25 DIAGNOSIS — F3289 Other specified depressive episodes: Secondary | ICD-10-CM | POA: Diagnosis not present

## 2014-02-25 LAB — CBC WITH DIFFERENTIAL/PLATELET
Basophils Absolute: 0 10*3/uL (ref 0.0–0.1)
Basophils Relative: 0 % (ref 0–1)
EOS ABS: 0.1 10*3/uL (ref 0.0–0.7)
Eosinophils Relative: 1 % (ref 0–5)
HEMATOCRIT: 29.2 % — AB (ref 36.0–46.0)
Hemoglobin: 10 g/dL — ABNORMAL LOW (ref 12.0–15.0)
LYMPHS ABS: 0.8 10*3/uL (ref 0.7–4.0)
LYMPHS PCT: 11 % — AB (ref 12–46)
MCH: 29 pg (ref 26.0–34.0)
MCHC: 34.2 g/dL (ref 30.0–36.0)
MCV: 84.6 fL (ref 78.0–100.0)
MONO ABS: 0.6 10*3/uL (ref 0.1–1.0)
Monocytes Relative: 9 % (ref 3–12)
Neutro Abs: 5.8 10*3/uL (ref 1.7–7.7)
Neutrophils Relative %: 79 % — ABNORMAL HIGH (ref 43–77)
Platelets: 284 10*3/uL (ref 150–400)
RBC: 3.45 MIL/uL — AB (ref 3.87–5.11)
RDW: 15.8 % — AB (ref 11.5–15.5)
WBC: 7.3 10*3/uL (ref 4.0–10.5)

## 2014-02-25 LAB — TYPE AND SCREEN
ABO/RH(D): O POS
Antibody Screen: NEGATIVE
Unit division: 0
Unit division: 0

## 2014-02-25 LAB — BASIC METABOLIC PANEL
Anion gap: 10 (ref 5–15)
BUN: 9 mg/dL (ref 6–23)
CALCIUM: 8.4 mg/dL (ref 8.4–10.5)
CO2: 28 mEq/L (ref 19–32)
Chloride: 103 mEq/L (ref 96–112)
Creatinine, Ser: 0.68 mg/dL (ref 0.50–1.10)
GFR calc Af Amer: >90 mL/min (ref >90)
GFR, EST NON AFRICAN AMERICAN: 85 mL/min — AB (ref >90)
GLUCOSE: 114 mg/dL — AB (ref 70–99)
Potassium: 3.9 mEq/L (ref 3.7–5.3)
SODIUM: 141 meq/L (ref 137–147)

## 2014-02-25 MED ORDER — CLONAZEPAM 0.5 MG PO TABS: 0.5000 mg | ORAL_TABLET | Freq: Two times a day (BID) | ORAL | Status: DC | PRN

## 2014-02-25 MED ORDER — OXYCODONE-ACETAMINOPHEN 5-325 MG PO TABS: 1.0000 | ORAL_TABLET | Freq: Four times a day (QID) | ORAL | Status: DC | PRN

## 2014-02-25 MED ORDER — ENOXAPARIN SODIUM 40 MG/0.4ML ~~LOC~~ SOLN: 40.0000 mg | SUBCUTANEOUS | Status: DC

## 2014-02-25 NOTE — Progress Notes (Signed)
Pt discharged to [facility] SNF today per Dr. Sarajane Jews. PT's IV site D/C'd and WNL. Pt's VSS. Report called to Caryl Pina, nurse at [facility].  Verbalized understanding.  D/C summary faxed to [facility] per request.  FL2 packet/scripts/D/C summary/D/C packet prepared and given to patient's husband with instructions to give to facility on arrival. Verbalized understanding. Pt left floor via WC in stable condition accompanied by NT. Husband to provide transport to [facility].

## 2014-02-25 NOTE — Progress Notes (Signed)
  PROGRESS NOTE  Haley Trevino TGY:[id number] DOB: 1941/01/04 DOA: 02/21/2014 PCP: Redge Gainer, MD  Summary: 73 year old woman admitted for hip fracture status post mechanical fall at home.  Assessment/Plan: 1. Right hip fracture status post mechanical fall. Status postoperative repair 8/19. Continues to do well. 2. History of Raynaud disease, scleroderma, hypothyroidism   Doing well. Discussed with Dr. Aline Brochure. Plan transferred to skilled nursing facility today.  Lovenox for 30 days DVT prophylaxis. Activity, follow-up recs per Dr. Hazle Quant.  Discussed with husband at bedside.  Murray Hodgkins, MD  [facility]  Pager [phone] If 7PM-7AM, please contact night-coverage at [email or web address], password [facility] 02/25/2014, 11:56 AM  LOS: 4 days   Consultants:  orthopedics  Procedures:  8/19 OPEN REDUCTION INTERNAL FIXATION RIGHT HIP (Right)  HPI/Subjective: Continues to feel better. Pain very well controlled. Mobilization improving.  Objective: Filed Vitals:   02/24/14 1520 02/24/14 1913 02/24/14 2023 02/25/14 0510  BP:   138/48 137/48  Pulse:    70  Temp: 99.7 F (37.6 C) 99.1 F (37.3 C) 99.2 F (37.3 C) 98.5 F (36.9 C)  TempSrc: Oral Oral Oral Oral  Resp:    18  Height:      Weight:      SpO2:    89%    Intake/Output Summary (Last 24 hours) at 02/25/14 1156 Last data filed at 02/25/14 0943  Gross per 24 hour  Intake    680 ml  Output   1125 ml  Net   -445 ml     Filed Weights   02/21/14 2236 02/22/14 0119 02/22/14 1124  Weight: 55.339 kg (122 lb) 55.36 kg (122 lb 0.8 oz) 55.339 kg (122 lb)    Exam:     Appears calm and comfortable. Vital stable.  Cardiovascular regular rate and. No murmur, rub or gallop.  Respiratory clear to auscultation bilaterally. No wheezes, rales or rhonchi. Normal respiratory effort.  Psychiatric grossly normal mood and affect. Speech fluent and appropriate.  Data Reviewed:  Basic metabolic panel, CBC  stable.  Scheduled Meds: . enoxaparin (LOVENOX) injection  40 mg Subcutaneous Q24H  . FLUoxetine  20 mg Oral QODAY  . levothyroxine  50 mcg Oral QAC breakfast  . NIFEdipine  90 mg Oral Daily  . pantoprazole  40 mg Oral Daily  . sildenafil  20 mg Oral BID   Continuous Infusions:    Principal Problem:   Hip fracture requiring operative repair Active Problems:   [name]   SCLERODERMA

## 2014-02-25 NOTE — Discharge Summary (Signed)
Physician Discharge Summary  Haley Trevino LEX:[id number] DOB: 29-Mar-1941 DOA: 02/21/2014  PCP: Haley Gainer, MD  Admit date: 02/21/2014 Discharge date: 02/25/2014  Recommendations for Outpatient Follow-up:  1. Recovery from right hip fracture 2. Activity, followup, wound care recommendations per Dr. Hazle Trevino 3. Lovenox for DVT prophylaxis    Follow-up Information   Follow up with Haley Gainer, MD In 2 weeks.   Specialty:  Family Medicine   Contact information:   [place] [address] [phone]       Follow up with Haley Kava, MD. Schedule an appointment as soon as possible for a visit in 2 weeks.   Specialty:  Orthopedic Surgery   Contact information:   [place] [address] [phone]      Discharge Diagnoses:  1. Radial fracture status post mechanical fall  2. Mild postoperative anemia (acute blood loss anemia)  Discharge Condition: Improved Disposition: Skilled nursing facility for short-term rehabilitation  Diet recommendation: Regular  Filed Weights   02/21/14 2236 02/22/14 0119 02/22/14 1124  Weight: 55.339 kg (122 lb) 55.36 kg (122 lb 0.8 oz) 55.339 kg (122 lb)    History of present illness:  73 year old woman admitted for hip fracture status post mechanical fall at home.  Hospital Course:  [name] was seen by orthopedics and underwent successful operative intervention for right hip fracture without complication. Her hospitalization was uncomplicated. She will now be going to skilled nursing facility for short-term rehabilitation. She should followup with orthopedics as corrected by the orthopedic office. Lovenox for DVT prophylaxis.  Discharge Instructions     Medication List         clonazePAM 0.5 MG tablet  Commonly known as:  KLONOPIN  Take 1 tablet (0.5 mg total) by mouth 2 (two) times daily as needed for anxiety.     enoxaparin 40 MG/0.4ML injection  Commonly known as:  LOVENOX    Inject 0.4 mLs (40 mg total) into the skin daily. For DVT prophylaxis, stop by 30 days post procedure (procedure 8/19)     FLUoxetine 20 MG tablet  Commonly known as:  PROZAC  Take 20 mg by mouth every other day.     NIFEdipine 30 MG 24 hr tablet  Commonly known as:  PROCARDIA-XL/ADALAT-CC/NIFEDICAL-XL  Take 3 tablets (90 mg total) by mouth daily.     omeprazole 20 MG capsule  Commonly known as:  PRILOSEC  Take 20 mg by mouth every other day.     oxyCODONE-acetaminophen 5-325 MG per tablet  Commonly known as:  PERCOCET/ROXICET  Take 1 tablet by mouth every 6 (six) hours as needed for severe pain.     PHILLIPS [employer]ON [employer]ALTH Caps  Take 1 capsule by mouth daily.     sildenafil 20 MG tablet  Commonly known as:  REVATIO  Take 20 mg by mouth 2 (two) times daily.     SYNTHROID 50 MCG tablet  Generic drug:  levothyroxine  Take 1 tablet (50 mcg total) by mouth daily before breakfast.       Allergies  Allergen Reactions  . Dilaudid [Hydromorphone Hcl] Anaphylaxis  . Codeine     REACTION: dyspnea    The results of significant diagnostics from this hospitalization (including imaging, microbiology, ancillary and laboratory) are listed below for reference.    Significant Diagnostic Studies: Dg Chest 1 View  02/21/2014   CLINICAL DATA:  [place].  EXAM: RIGHT FEMUR - 2 VIEW; CHEST - 1 VIEW; PELVIS - 1-2 VIEW  COMPARISON:  None.  FINDINGS: Pelvis and right femur:  Both hips are normally located. The pubic symphysis and SI joints are intact. No pelvic fractures. There is a mildly displaced intertrochanteric fracture of the right hip with a mild varus deformity. The femur is intact. The knee joint appears normal.  Chest x-ray:  The cardiac silhouette, mediastinal and hilar contours are within normal limits and stable. The lungs are clear. Calcified breast implants are noted. No pleural effusion. The bony thorax is intact.  IMPRESSION: 1. Mildly displaced intertrochanteric fracture of the right  hip. 2. No acute cardiopulmonary findings.   Electronically Signed   By: Haley Trevino M.D.   On: 02/21/2014 23:30   Dg Pelvis 1-2 Views  02/21/2014   CLINICAL DATA:  [place].  EXAM: RIGHT FEMUR - 2 VIEW; CHEST - 1 VIEW; PELVIS - 1-2 VIEW  COMPARISON:  None.  FINDINGS: Pelvis and right femur:  Both hips are normally located. The pubic symphysis and SI joints are intact. No pelvic fractures. There is a mildly displaced intertrochanteric fracture of the right hip with a mild varus deformity. The femur is intact. The knee joint appears normal.  Chest x-ray:  The cardiac silhouette, mediastinal and hilar contours are within normal limits and stable. The lungs are clear. Calcified breast implants are noted. No pleural effusion. The bony thorax is intact.  IMPRESSION: 1. Mildly displaced intertrochanteric fracture of the right hip. 2. No acute cardiopulmonary findings.   Electronically Signed   By: Haley Trevino M.D.   On: 02/21/2014 23:30   Dg Hip Operative Right  02/22/2014   CLINICAL DATA:  Post ORIF of the right hip  EXAM: DG OPERATIVE RIGHT HIP  FLUOROSCOPY TIME:  19 seconds  COMPARISON:  Right hip radiographs - 02/21/2014  FINDINGS: Three spot intraoperative fluoroscopic images of the right hip are provided for review.  Images demonstrate the sequela of dynamic screw fixation of the right femoral neck and sideplate fixation of the proximal meta diaphysis of the femur. Alignment appears anatomic. No evidence of hardware failure or loosening. No new fractures identified. Regional soft tissues appear normal. No radiopaque foreign body.  IMPRESSION: Post ORIF of the right hip without evidence of complication.   Electronically Signed   By: Haley Trevino M.D.   On: 02/22/2014 13:52   Dg Femur Right  02/21/2014   CLINICAL DATA:  [place].  EXAM: RIGHT FEMUR - 2 VIEW; CHEST - 1 VIEW; PELVIS - 1-2 VIEW  COMPARISON:  None.  FINDINGS: Pelvis and right femur:  Both hips are normally located. The pubic symphysis and SI  joints are intact. No pelvic fractures. There is a mildly displaced intertrochanteric fracture of the right hip with a mild varus deformity. The femur is intact. The knee joint appears normal.  Chest x-ray:  The cardiac silhouette, mediastinal and hilar contours are within normal limits and stable. The lungs are clear. Calcified breast implants are noted. No pleural effusion. The bony thorax is intact.  IMPRESSION: 1. Mildly displaced intertrochanteric fracture of the right hip. 2. No acute cardiopulmonary findings.   Electronically Signed   By: Haley Trevino M.D.   On: 02/21/2014 23:30    Microbiology: Recent Results (from the past 240 hour(s))  SURGICAL PCR SCREEN     Status: None   Collection Time    02/22/14  2:41 AM      Result Value Ref Range Status   MRSA, PCR NEGATIVE  NEGATIVE Final   Staphylococcus aureus NEGATIVE  NEGATIVE Final   Comment:  The Xpert SA Assay (FDA     approved for NASAL specimens     in patients over 38 years of age),     is one component of     a comprehensive surveillance     program.  Test performance has     been validated by [employer] for patients greater     than or equal to 106 year old.     It is not intended     to diagnose infection nor to     guide or monitor treatment.     Labs: Basic Metabolic Panel:  Recent Labs Lab 02/21/14 2339 02/22/14 0513 02/23/14 0513 02/24/14 0515 02/25/14 0546  NA 140 143 141 138 141  K 3.4* 3.8 4.7 4.0 3.9  CL 103 106 104 100 103  CO2 21 25 28 27 28   GLUCOSE 127* 135* 118* 120* 114*  BUN 7 7 9 9 9   CREATININE 0.72 0.68 0.77 0.70 0.68  CALCIUM 9.1 8.7 8.5 8.4 8.4   CBC:  Recent Labs Lab 02/21/14 2339 02/22/14 0513 02/23/14 0513 02/24/14 0515 02/25/14 0546  WBC 11.9* 12.2* 7.9 9.0 7.3  NEUTROABS 9.9*  --  6.0 7.1 5.8  HGB 12.3 11.1* 10.6* 10.2* 10.0*  HCT 35.5* 32.5* 31.9* 30.0* 29.2*  MCV 80.1 83.1 85.8 84.3 84.6  PLT 338 323 260 274 284    Principal Problem:   Hip  fracture requiring operative repair Active Problems:   [name]   SCLERODERMA   Time coordinating discharge: 20 minutes  Signed:  Murray Hodgkins, MD [facility] 02/25/2014, 12:01 PM

## 2014-02-25 NOTE — Progress Notes (Signed)
Subjective: 3 Days Post-Op Procedure(s) (LRB): OPEN REDUCTION INTERNAL FIXATION RIGHT HIP (Right) Patient reports pain as Controlled well with Percocet orally.    Objective: Vital signs in last 24 hours: BP 137/48  Pulse 70  Temp(Src) 98.5 F (36.9 C) (Oral)  Resp 18  Ht 5\' 2"  (1.575 m)  Wt 122 lb (55.339 kg)  BMI 22.31 kg/m2  SpO2 89%    Recent Labs  02/24/14 0515 02/25/14 0546  WBC 9.0 7.3  RBC 3.56* 3.45*  HCT 30.0* 29.2*  PLT 274 284    Recent Labs  02/24/14 0515 02/25/14 0546  NA 138 141  K 4.0 3.9  CL 100 103  CO2 27 28  BUN 9 9  CREATININE 0.70 0.68  GLUCOSE 120* 114*  CALCIUM 8.4 8.4   No results found for this basename: LABPT, INR,  in the last 72 hours    Assessment/Plan: 3 Days Post-Op Procedure(s) (LRB): OPEN REDUCTION INTERNAL FIXATION RIGHT HIP (Right) Discharge to SNF  [facility] 02/25/2014, 10:39 AM

## 2014-03-07 DIAGNOSIS — S72009D Fracture of unspecified part of neck of unspecified femur, subsequent encounter for closed fracture with routine healing: Secondary | ICD-10-CM | POA: Diagnosis not present

## 2014-03-07 DIAGNOSIS — I10 Essential (primary) hypertension: Secondary | ICD-10-CM | POA: Diagnosis not present

## 2014-03-07 DIAGNOSIS — R279 Unspecified lack of coordination: Secondary | ICD-10-CM | POA: Diagnosis not present

## 2014-03-07 DIAGNOSIS — I7300 Raynaud's syndrome without gangrene: Secondary | ICD-10-CM | POA: Diagnosis not present

## 2014-03-07 DIAGNOSIS — Z09 Encounter for follow-up examination after completed treatment for conditions other than malignant neoplasm: Secondary | ICD-10-CM | POA: Diagnosis not present

## 2014-03-07 DIAGNOSIS — F3289 Other specified depressive episodes: Secondary | ICD-10-CM | POA: Diagnosis not present

## 2014-03-07 DIAGNOSIS — R262 Difficulty in walking, not elsewhere classified: Secondary | ICD-10-CM | POA: Diagnosis not present

## 2014-03-07 DIAGNOSIS — M349 Systemic sclerosis, unspecified: Secondary | ICD-10-CM | POA: Diagnosis not present

## 2014-03-07 DIAGNOSIS — D62 Acute posthemorrhagic anemia: Secondary | ICD-10-CM | POA: Diagnosis not present

## 2014-03-07 DIAGNOSIS — M6281 Muscle weakness (generalized): Secondary | ICD-10-CM | POA: Diagnosis not present

## 2014-03-09 ENCOUNTER — Encounter: Payer: Self-pay | Admitting: *Deleted

## 2014-03-17 DIAGNOSIS — I7300 Raynaud's syndrome without gangrene: Secondary | ICD-10-CM | POA: Diagnosis not present

## 2014-03-17 DIAGNOSIS — S72141D Displaced intertrochanteric fracture of right femur, subsequent encounter for closed fracture with routine healing: Secondary | ICD-10-CM | POA: Diagnosis not present

## 2014-03-17 DIAGNOSIS — M6281 Muscle weakness (generalized): Secondary | ICD-10-CM | POA: Diagnosis not present

## 2014-03-17 DIAGNOSIS — M1990 Unspecified osteoarthritis, unspecified site: Secondary | ICD-10-CM | POA: Diagnosis not present

## 2014-03-17 DIAGNOSIS — I10 Essential (primary) hypertension: Secondary | ICD-10-CM | POA: Diagnosis not present

## 2014-03-17 DIAGNOSIS — M349 Systemic sclerosis, unspecified: Secondary | ICD-10-CM | POA: Diagnosis not present

## 2014-03-20 ENCOUNTER — Telehealth: Payer: Self-pay | Admitting: Nurse Practitioner

## 2014-03-20 DIAGNOSIS — I7300 Raynaud's syndrome without gangrene: Secondary | ICD-10-CM | POA: Diagnosis not present

## 2014-03-20 DIAGNOSIS — M1990 Unspecified osteoarthritis, unspecified site: Secondary | ICD-10-CM | POA: Diagnosis not present

## 2014-03-20 DIAGNOSIS — I10 Essential (primary) hypertension: Secondary | ICD-10-CM | POA: Diagnosis not present

## 2014-03-20 DIAGNOSIS — M6281 Muscle weakness (generalized): Secondary | ICD-10-CM | POA: Diagnosis not present

## 2014-03-20 DIAGNOSIS — S72141D Displaced intertrochanteric fracture of right femur, subsequent encounter for closed fracture with routine healing: Secondary | ICD-10-CM | POA: Diagnosis not present

## 2014-03-20 DIAGNOSIS — M349 Systemic sclerosis, unspecified: Secondary | ICD-10-CM | POA: Diagnosis not present

## 2014-03-21 DIAGNOSIS — I7300 Raynaud's syndrome without gangrene: Secondary | ICD-10-CM | POA: Diagnosis not present

## 2014-03-21 DIAGNOSIS — M1990 Unspecified osteoarthritis, unspecified site: Secondary | ICD-10-CM | POA: Diagnosis not present

## 2014-03-21 DIAGNOSIS — I10 Essential (primary) hypertension: Secondary | ICD-10-CM | POA: Diagnosis not present

## 2014-03-21 DIAGNOSIS — S72141D Displaced intertrochanteric fracture of right femur, subsequent encounter for closed fracture with routine healing: Secondary | ICD-10-CM | POA: Diagnosis not present

## 2014-03-21 DIAGNOSIS — M349 Systemic sclerosis, unspecified: Secondary | ICD-10-CM | POA: Diagnosis not present

## 2014-03-21 DIAGNOSIS — M6281 Muscle weakness (generalized): Secondary | ICD-10-CM | POA: Diagnosis not present

## 2014-03-22 DIAGNOSIS — S72141D Displaced intertrochanteric fracture of right femur, subsequent encounter for closed fracture with routine healing: Secondary | ICD-10-CM | POA: Diagnosis not present

## 2014-03-22 DIAGNOSIS — M349 Systemic sclerosis, unspecified: Secondary | ICD-10-CM | POA: Diagnosis not present

## 2014-03-22 DIAGNOSIS — I10 Essential (primary) hypertension: Secondary | ICD-10-CM | POA: Diagnosis not present

## 2014-03-22 DIAGNOSIS — M6281 Muscle weakness (generalized): Secondary | ICD-10-CM | POA: Diagnosis not present

## 2014-03-22 DIAGNOSIS — I7300 Raynaud's syndrome without gangrene: Secondary | ICD-10-CM | POA: Diagnosis not present

## 2014-03-22 DIAGNOSIS — M1990 Unspecified osteoarthritis, unspecified site: Secondary | ICD-10-CM | POA: Diagnosis not present

## 2014-03-23 DIAGNOSIS — M1990 Unspecified osteoarthritis, unspecified site: Secondary | ICD-10-CM | POA: Diagnosis not present

## 2014-03-23 DIAGNOSIS — I10 Essential (primary) hypertension: Secondary | ICD-10-CM | POA: Diagnosis not present

## 2014-03-23 DIAGNOSIS — S72141D Displaced intertrochanteric fracture of right femur, subsequent encounter for closed fracture with routine healing: Secondary | ICD-10-CM | POA: Diagnosis not present

## 2014-03-23 DIAGNOSIS — M349 Systemic sclerosis, unspecified: Secondary | ICD-10-CM | POA: Diagnosis not present

## 2014-03-23 DIAGNOSIS — I7300 Raynaud's syndrome without gangrene: Secondary | ICD-10-CM | POA: Diagnosis not present

## 2014-03-23 DIAGNOSIS — M6281 Muscle weakness (generalized): Secondary | ICD-10-CM | POA: Diagnosis not present

## 2014-03-24 DIAGNOSIS — S72141D Displaced intertrochanteric fracture of right femur, subsequent encounter for closed fracture with routine healing: Secondary | ICD-10-CM | POA: Diagnosis not present

## 2014-03-24 DIAGNOSIS — I7300 Raynaud's syndrome without gangrene: Secondary | ICD-10-CM | POA: Diagnosis not present

## 2014-03-24 DIAGNOSIS — M6281 Muscle weakness (generalized): Secondary | ICD-10-CM | POA: Diagnosis not present

## 2014-03-24 DIAGNOSIS — I10 Essential (primary) hypertension: Secondary | ICD-10-CM | POA: Diagnosis not present

## 2014-03-24 DIAGNOSIS — M1990 Unspecified osteoarthritis, unspecified site: Secondary | ICD-10-CM | POA: Diagnosis not present

## 2014-03-24 DIAGNOSIS — M349 Systemic sclerosis, unspecified: Secondary | ICD-10-CM | POA: Diagnosis not present

## 2014-03-27 DIAGNOSIS — I7300 Raynaud's syndrome without gangrene: Secondary | ICD-10-CM | POA: Diagnosis not present

## 2014-03-27 DIAGNOSIS — M1990 Unspecified osteoarthritis, unspecified site: Secondary | ICD-10-CM | POA: Diagnosis not present

## 2014-03-27 DIAGNOSIS — S72141D Displaced intertrochanteric fracture of right femur, subsequent encounter for closed fracture with routine healing: Secondary | ICD-10-CM | POA: Diagnosis not present

## 2014-03-27 DIAGNOSIS — M349 Systemic sclerosis, unspecified: Secondary | ICD-10-CM | POA: Diagnosis not present

## 2014-03-27 DIAGNOSIS — M6281 Muscle weakness (generalized): Secondary | ICD-10-CM | POA: Diagnosis not present

## 2014-03-27 DIAGNOSIS — I10 Essential (primary) hypertension: Secondary | ICD-10-CM | POA: Diagnosis not present

## 2014-03-28 DIAGNOSIS — M6281 Muscle weakness (generalized): Secondary | ICD-10-CM | POA: Diagnosis not present

## 2014-03-28 DIAGNOSIS — I10 Essential (primary) hypertension: Secondary | ICD-10-CM | POA: Diagnosis not present

## 2014-03-28 DIAGNOSIS — M349 Systemic sclerosis, unspecified: Secondary | ICD-10-CM | POA: Diagnosis not present

## 2014-03-28 DIAGNOSIS — I7300 Raynaud's syndrome without gangrene: Secondary | ICD-10-CM | POA: Diagnosis not present

## 2014-03-28 DIAGNOSIS — S72141D Displaced intertrochanteric fracture of right femur, subsequent encounter for closed fracture with routine healing: Secondary | ICD-10-CM | POA: Diagnosis not present

## 2014-03-28 DIAGNOSIS — M1990 Unspecified osteoarthritis, unspecified site: Secondary | ICD-10-CM | POA: Diagnosis not present

## 2014-03-29 DIAGNOSIS — M6281 Muscle weakness (generalized): Secondary | ICD-10-CM | POA: Diagnosis not present

## 2014-03-29 DIAGNOSIS — M349 Systemic sclerosis, unspecified: Secondary | ICD-10-CM | POA: Diagnosis not present

## 2014-03-29 DIAGNOSIS — S72141D Displaced intertrochanteric fracture of right femur, subsequent encounter for closed fracture with routine healing: Secondary | ICD-10-CM | POA: Diagnosis not present

## 2014-03-29 DIAGNOSIS — I10 Essential (primary) hypertension: Secondary | ICD-10-CM | POA: Diagnosis not present

## 2014-03-29 DIAGNOSIS — M1990 Unspecified osteoarthritis, unspecified site: Secondary | ICD-10-CM | POA: Diagnosis not present

## 2014-03-29 DIAGNOSIS — I7300 Raynaud's syndrome without gangrene: Secondary | ICD-10-CM | POA: Diagnosis not present

## 2014-03-30 DIAGNOSIS — M1990 Unspecified osteoarthritis, unspecified site: Secondary | ICD-10-CM | POA: Diagnosis not present

## 2014-03-30 DIAGNOSIS — M6281 Muscle weakness (generalized): Secondary | ICD-10-CM | POA: Diagnosis not present

## 2014-03-30 DIAGNOSIS — M349 Systemic sclerosis, unspecified: Secondary | ICD-10-CM | POA: Diagnosis not present

## 2014-03-30 DIAGNOSIS — S72141D Displaced intertrochanteric fracture of right femur, subsequent encounter for closed fracture with routine healing: Secondary | ICD-10-CM | POA: Diagnosis not present

## 2014-03-30 DIAGNOSIS — I10 Essential (primary) hypertension: Secondary | ICD-10-CM | POA: Diagnosis not present

## 2014-03-30 DIAGNOSIS — I7300 Raynaud's syndrome without gangrene: Secondary | ICD-10-CM | POA: Diagnosis not present

## 2014-03-31 DIAGNOSIS — I10 Essential (primary) hypertension: Secondary | ICD-10-CM | POA: Diagnosis not present

## 2014-03-31 DIAGNOSIS — M1990 Unspecified osteoarthritis, unspecified site: Secondary | ICD-10-CM | POA: Diagnosis not present

## 2014-03-31 DIAGNOSIS — I7300 Raynaud's syndrome without gangrene: Secondary | ICD-10-CM | POA: Diagnosis not present

## 2014-03-31 DIAGNOSIS — M6281 Muscle weakness (generalized): Secondary | ICD-10-CM | POA: Diagnosis not present

## 2014-03-31 DIAGNOSIS — M349 Systemic sclerosis, unspecified: Secondary | ICD-10-CM | POA: Diagnosis not present

## 2014-03-31 DIAGNOSIS — S72141D Displaced intertrochanteric fracture of right femur, subsequent encounter for closed fracture with routine healing: Secondary | ICD-10-CM | POA: Diagnosis not present

## 2014-04-04 DIAGNOSIS — I7300 Raynaud's syndrome without gangrene: Secondary | ICD-10-CM | POA: Diagnosis not present

## 2014-04-04 DIAGNOSIS — M349 Systemic sclerosis, unspecified: Secondary | ICD-10-CM | POA: Diagnosis not present

## 2014-04-04 DIAGNOSIS — M1990 Unspecified osteoarthritis, unspecified site: Secondary | ICD-10-CM | POA: Diagnosis not present

## 2014-04-04 DIAGNOSIS — I10 Essential (primary) hypertension: Secondary | ICD-10-CM | POA: Diagnosis not present

## 2014-04-04 DIAGNOSIS — S72141D Displaced intertrochanteric fracture of right femur, subsequent encounter for closed fracture with routine healing: Secondary | ICD-10-CM | POA: Diagnosis not present

## 2014-04-04 DIAGNOSIS — M6281 Muscle weakness (generalized): Secondary | ICD-10-CM | POA: Diagnosis not present

## 2014-04-05 DIAGNOSIS — I10 Essential (primary) hypertension: Secondary | ICD-10-CM | POA: Diagnosis not present

## 2014-04-05 DIAGNOSIS — I7300 Raynaud's syndrome without gangrene: Secondary | ICD-10-CM | POA: Diagnosis not present

## 2014-04-05 DIAGNOSIS — M6281 Muscle weakness (generalized): Secondary | ICD-10-CM | POA: Diagnosis not present

## 2014-04-05 DIAGNOSIS — M349 Systemic sclerosis, unspecified: Secondary | ICD-10-CM | POA: Diagnosis not present

## 2014-04-05 DIAGNOSIS — S72141D Displaced intertrochanteric fracture of right femur, subsequent encounter for closed fracture with routine healing: Secondary | ICD-10-CM | POA: Diagnosis not present

## 2014-04-05 DIAGNOSIS — M1990 Unspecified osteoarthritis, unspecified site: Secondary | ICD-10-CM | POA: Diagnosis not present

## 2014-04-06 DIAGNOSIS — I10 Essential (primary) hypertension: Secondary | ICD-10-CM | POA: Diagnosis not present

## 2014-04-06 DIAGNOSIS — M349 Systemic sclerosis, unspecified: Secondary | ICD-10-CM | POA: Diagnosis not present

## 2014-04-06 DIAGNOSIS — I7300 Raynaud's syndrome without gangrene: Secondary | ICD-10-CM | POA: Diagnosis not present

## 2014-04-06 DIAGNOSIS — M1990 Unspecified osteoarthritis, unspecified site: Secondary | ICD-10-CM | POA: Diagnosis not present

## 2014-04-06 DIAGNOSIS — M6281 Muscle weakness (generalized): Secondary | ICD-10-CM | POA: Diagnosis not present

## 2014-04-06 DIAGNOSIS — S72141D Displaced intertrochanteric fracture of right femur, subsequent encounter for closed fracture with routine healing: Secondary | ICD-10-CM | POA: Diagnosis not present

## 2014-04-07 DIAGNOSIS — I10 Essential (primary) hypertension: Secondary | ICD-10-CM | POA: Diagnosis not present

## 2014-04-07 DIAGNOSIS — I7300 Raynaud's syndrome without gangrene: Secondary | ICD-10-CM | POA: Diagnosis not present

## 2014-04-07 DIAGNOSIS — M6281 Muscle weakness (generalized): Secondary | ICD-10-CM | POA: Diagnosis not present

## 2014-04-07 DIAGNOSIS — M349 Systemic sclerosis, unspecified: Secondary | ICD-10-CM | POA: Diagnosis not present

## 2014-04-07 DIAGNOSIS — S72141D Displaced intertrochanteric fracture of right femur, subsequent encounter for closed fracture with routine healing: Secondary | ICD-10-CM | POA: Diagnosis not present

## 2014-04-07 DIAGNOSIS — M1990 Unspecified osteoarthritis, unspecified site: Secondary | ICD-10-CM | POA: Diagnosis not present

## 2014-04-11 DIAGNOSIS — I10 Essential (primary) hypertension: Secondary | ICD-10-CM | POA: Diagnosis not present

## 2014-04-11 DIAGNOSIS — I7300 Raynaud's syndrome without gangrene: Secondary | ICD-10-CM | POA: Diagnosis not present

## 2014-04-11 DIAGNOSIS — M349 Systemic sclerosis, unspecified: Secondary | ICD-10-CM | POA: Diagnosis not present

## 2014-04-11 DIAGNOSIS — M6281 Muscle weakness (generalized): Secondary | ICD-10-CM | POA: Diagnosis not present

## 2014-04-11 DIAGNOSIS — S72141D Displaced intertrochanteric fracture of right femur, subsequent encounter for closed fracture with routine healing: Secondary | ICD-10-CM | POA: Diagnosis not present

## 2014-04-11 DIAGNOSIS — M1990 Unspecified osteoarthritis, unspecified site: Secondary | ICD-10-CM | POA: Diagnosis not present

## 2014-04-12 DIAGNOSIS — M1990 Unspecified osteoarthritis, unspecified site: Secondary | ICD-10-CM | POA: Diagnosis not present

## 2014-04-12 DIAGNOSIS — I7300 Raynaud's syndrome without gangrene: Secondary | ICD-10-CM | POA: Diagnosis not present

## 2014-04-12 DIAGNOSIS — S72141D Displaced intertrochanteric fracture of right femur, subsequent encounter for closed fracture with routine healing: Secondary | ICD-10-CM | POA: Diagnosis not present

## 2014-04-12 DIAGNOSIS — M6281 Muscle weakness (generalized): Secondary | ICD-10-CM | POA: Diagnosis not present

## 2014-04-12 DIAGNOSIS — M349 Systemic sclerosis, unspecified: Secondary | ICD-10-CM | POA: Diagnosis not present

## 2014-04-12 DIAGNOSIS — I10 Essential (primary) hypertension: Secondary | ICD-10-CM | POA: Diagnosis not present

## 2014-04-13 DIAGNOSIS — I7300 Raynaud's syndrome without gangrene: Secondary | ICD-10-CM | POA: Diagnosis not present

## 2014-04-13 DIAGNOSIS — M6281 Muscle weakness (generalized): Secondary | ICD-10-CM | POA: Diagnosis not present

## 2014-04-13 DIAGNOSIS — S72141D Displaced intertrochanteric fracture of right femur, subsequent encounter for closed fracture with routine healing: Secondary | ICD-10-CM | POA: Diagnosis not present

## 2014-04-13 DIAGNOSIS — M1990 Unspecified osteoarthritis, unspecified site: Secondary | ICD-10-CM | POA: Diagnosis not present

## 2014-04-13 DIAGNOSIS — I10 Essential (primary) hypertension: Secondary | ICD-10-CM | POA: Diagnosis not present

## 2014-04-13 DIAGNOSIS — M349 Systemic sclerosis, unspecified: Secondary | ICD-10-CM | POA: Diagnosis not present

## 2014-04-18 DIAGNOSIS — S72141D Displaced intertrochanteric fracture of right femur, subsequent encounter for closed fracture with routine healing: Secondary | ICD-10-CM | POA: Diagnosis not present

## 2014-04-18 DIAGNOSIS — I10 Essential (primary) hypertension: Secondary | ICD-10-CM | POA: Diagnosis not present

## 2014-04-19 ENCOUNTER — Ambulatory Visit (INDEPENDENT_AMBULATORY_CARE_PROVIDER_SITE_OTHER): Payer: Medicare Other

## 2014-04-19 DIAGNOSIS — Z23 Encounter for immunization: Secondary | ICD-10-CM | POA: Diagnosis not present

## 2014-05-03 ENCOUNTER — Other Ambulatory Visit: Payer: Self-pay | Admitting: Nurse Practitioner

## 2014-05-05 NOTE — Telephone Encounter (Signed)
Patient last seen in office on 01-25-14. Rx last filled on 03-27-14 for #[phone]. Please advise

## 2014-05-05 NOTE — Telephone Encounter (Signed)
Please call in klonopin with 1 refills 

## 2014-05-06 NOTE — Telephone Encounter (Signed)
rx called into pharmacy

## 2014-05-10 DIAGNOSIS — M25571 Pain in right ankle and joints of right foot: Secondary | ICD-10-CM | POA: Diagnosis not present

## 2014-05-10 DIAGNOSIS — M25471 Effusion, right ankle: Secondary | ICD-10-CM | POA: Diagnosis not present

## 2014-05-10 DIAGNOSIS — M150 Primary generalized (osteo)arthritis: Secondary | ICD-10-CM | POA: Diagnosis not present

## 2014-05-10 DIAGNOSIS — I7300 Raynaud's syndrome without gangrene: Secondary | ICD-10-CM | POA: Diagnosis not present

## 2014-05-10 DIAGNOSIS — M810 Age-related osteoporosis without current pathological fracture: Secondary | ICD-10-CM | POA: Diagnosis not present

## 2014-05-10 DIAGNOSIS — M340 Progressive systemic sclerosis: Secondary | ICD-10-CM | POA: Diagnosis not present

## 2014-05-16 DIAGNOSIS — H25813 Combined forms of age-related cataract, bilateral: Secondary | ICD-10-CM | POA: Diagnosis not present

## 2014-06-02 ENCOUNTER — Ambulatory Visit (INDEPENDENT_AMBULATORY_CARE_PROVIDER_SITE_OTHER): Payer: Medicare Other | Admitting: Nurse Practitioner

## 2014-06-02 ENCOUNTER — Encounter: Payer: Self-pay | Admitting: Nurse Practitioner

## 2014-06-02 VITALS — BP 146/60 | HR 72 | Temp 97.3°F | Ht 62.0 in | Wt 129.2 lb

## 2014-06-02 DIAGNOSIS — E785 Hyperlipidemia, unspecified: Secondary | ICD-10-CM | POA: Diagnosis not present

## 2014-06-02 DIAGNOSIS — F329 Major depressive disorder, single episode, unspecified: Secondary | ICD-10-CM

## 2014-06-02 DIAGNOSIS — F32A Depression, unspecified: Secondary | ICD-10-CM

## 2014-06-02 DIAGNOSIS — I7300 Raynaud's syndrome without gangrene: Secondary | ICD-10-CM | POA: Diagnosis not present

## 2014-06-02 DIAGNOSIS — Z23 Encounter for immunization: Secondary | ICD-10-CM | POA: Diagnosis not present

## 2014-06-02 DIAGNOSIS — M349 Systemic sclerosis, unspecified: Secondary | ICD-10-CM | POA: Diagnosis not present

## 2014-06-02 DIAGNOSIS — E039 Hypothyroidism, unspecified: Secondary | ICD-10-CM

## 2014-06-02 DIAGNOSIS — K219 Gastro-esophageal reflux disease without esophagitis: Secondary | ICD-10-CM

## 2014-06-02 DIAGNOSIS — I10 Essential (primary) hypertension: Secondary | ICD-10-CM

## 2014-06-02 MED ORDER — OMEPRAZOLE 20 MG PO CPDR: 20.0000 mg | DELAYED_RELEASE_CAPSULE | ORAL | Status: DC

## 2014-06-02 MED ORDER — SYNTHROID 50 MCG PO TABS: 50.0000 ug | ORAL_TABLET | Freq: Every day | ORAL | Status: DC

## 2014-06-02 MED ORDER — NIFEDIPINE ER OSMOTIC RELEASE 30 MG PO TB24: 90.0000 mg | ORAL_TABLET | Freq: Every day | ORAL | Status: DC

## 2014-06-02 MED ORDER — FLUOXETINE HCL 40 MG PO CAPS: 40.0000 mg | ORAL_CAPSULE | Freq: Every day | ORAL | Status: DC

## 2014-06-02 NOTE — Progress Notes (Signed)
Subjective:    Patient ID: Haley Trevino, female    DOB: [birth year], 73 y.o.   MRN: [id number]  HPI   Patient here today for follow up of chronic medical problems: Patient fell and broke her right hip 3  Months ago and had to have pins put in it- had rehab but is not back to normal yet. Sclerderma Getting worse- is being referred to [facility] starting to get ulcers on fingers that get infected. Currently on doxy for finger infections- doesn't know how long she will be taking this. Having trouble walking due to so much pain. Raynauds Still has bad nerve pain and hurts to just touch anything. Has started on lyrica to help with nerve pain- could not tolerate gabapentin because it caused swelling. Has minimal swelling for lyrica but not like she had with gabapentin Hypothyroidism SYnthroid 47mg and doing well with that. Has fatigue but she thinks that is from everything nit just thyroid problems. Depression Decided to take a break from it because she has been on it so long- Sees psychiatrist next tomorrow and will probably start back on  It tomorrow. Hyperlipidemia-  Welchol is the only thing she can tolerate- statins make her hurt Hypertension On procardia which helps greatly with blood pressure and raynauds. Gerd Omeprazole works well to keep symptoms under control.  * depression screen done today but patient says she is having a bad day so not good dya to evaluate- most days she does well.  Review of Systems  Constitutional: Negative.   HENT: Negative.   Genitourinary: Negative.   All other systems reviewed and are negative.      Objective:   Physical Exam  Constitutional: She is oriented to person, place, and time. She appears well-developed and well-nourished.  HENT:  Nose: Nose normal.  Mouth/Throat: Oropharynx is clear and moist.  Eyes: EOM are normal.  Neck: Trachea normal, normal range of motion and full passive range of motion without pain. Neck supple. No JVD present.  Carotid bruit is not present. No thyromegaly present.  Cardiovascular: Normal rate, regular rhythm, normal heart sounds and intact distal pulses.  Exam reveals no gallop and no friction rub.   No murmur heard. Tips of fingers are cold to touch with slow cap refill.  Pulmonary/Chest: Effort normal and breath sounds normal.  Abdominal: Soft. Bowel sounds are normal. She exhibits no distension and no mass. There is no tenderness.  Musculoskeletal: Normal range of motion.  limps with gait but improving  Lymphadenopathy:    She has no cervical adenopathy.  Neurological: She is alert and oriented to person, place, and time. She has normal reflexes.  Skin: Skin is warm and dry.  Psychiatric: She has a normal mood and affect. Her behavior is normal. Judgment and thought content normal.    BP 146/60 mmHg  Pulse 72  Temp(Src) 97.3 F (36.3 C) (Oral)  Ht '5\' 2"'  (1.575 m)  Wt 129 lb 4 oz (58.627 kg)  BMI 23.63 kg/m2       Assessment & Plan:   1. [name] Keep hands warm - NIFEdipine (PROCARDIA-XL/ADALAT-CC/NIFEDICAL-XL) 30 MG 24 hr tablet; Take 3 tablets (90 mg total) by mouth daily.  Dispense: 90 tablet; Refill: 1  2. Essential hypertension Do nont add salt to diet - CMP14+EGFR  3. Gastroesophageal reflux disease without esophagitis Avoid spicy and fatty foods - omeprazole (PRILOSEC) 20 MG capsule; Take 1 capsule (20 mg total) by mouth every other day.  Dispense: 90 capsule; Refill: 1  4. Hypothyroidism, unspecified hypothyroidism type - SYNTHROID 50 MCG tablet; Take 1 tablet (50 mcg total) by mouth daily before breakfast.  Dispense: 90 tablet; Refill: 1  5. SCLERODERMA Keep follow up with specialist  6. Hyperlipidemia with target LDL less than 100 Low fta diet - NMR, lipoprofile  7. Depression Stress management - FLUoxetine (PROZAC) 40 MG capsule; Take 1 capsule (40 mg total) by mouth daily.  Dispense: 90 capsule; Refill: 1    Labs pending Health maintenance  reviewed Diet and exercise encouraged Continue all meds Follow up  In 3 month   [place], FNP

## 2014-06-02 NOTE — Patient Instructions (Addendum)
Health Maintenance Adopting a healthy lifestyle and getting preventive care can go a long way to promote health and wellness. Talk with your health care provider about what schedule of regular examinations is right for you. This is a good chance for you to check in with your provider about disease prevention and staying healthy. In between checkups, there are plenty of things you can do on your own. Experts have done a lot of research about which lifestyle changes and preventive measures are most likely to keep you healthy. Ask your health care provider for more information. WEIGHT AND DIET  Eat a healthy diet  Be sure to include plenty of vegetables, fruits, low-fat dairy products, and lean protein.  Do not eat a lot of foods high in solid fats, added sugars, or salt.  Get regular exercise. This is one of the most important things you can do for your health.  Most adults should exercise for at least 150 minutes each week. The exercise should increase your heart rate and make you sweat (moderate-intensity exercise).  Most adults should also do strengthening exercises at least twice a week. This is in addition to the moderate-intensity exercise.  Maintain a healthy weight  Body mass index (BMI) is a measurement that can be used to identify possible weight problems. It estimates body fat based on height and weight. Your health care provider can help determine your BMI and help you achieve or maintain a healthy weight.  For females 25 years of age and older:   A BMI below 18.5 is considered underweight.  A BMI of 18.5 to 24.9 is normal.  A BMI of 25 to 29.9 is considered overweight.  A BMI of 30 and above is considered obese.  Watch levels of cholesterol and blood lipids  You should start having your blood tested for lipids and cholesterol at 72 years of age, then have this test every 5 years.  You may need to have your cholesterol levels checked more often if:  Your lipid or  cholesterol levels are high.  You are older than 72 years of age.  You are at high risk for heart disease.  CANCER SCREENING   Lung Cancer  Lung cancer screening is recommended for adults 97-92 years old who are at high risk for lung cancer because of a history of smoking.  A yearly low-dose CT scan of the lungs is recommended for people who:  Currently smoke.  Have quit within the past 15 years.  Have at least a 30-pack-year history of smoking. A pack year is smoking an average of one pack of cigarettes a day for 1 year.  Yearly screening should continue until it has been 15 years since you quit.  Yearly screening should stop if you develop a health problem that would prevent you from having lung cancer treatment.  Breast Cancer  Practice breast self-awareness. This means understanding how your breasts normally appear and feel.  It also means doing regular breast self-exams. Let your health care provider know about any changes, no matter how small.  If you are in your 20s or 30s, you should have a clinical breast exam (CBE) by a health care provider every 1-3 years as part of a regular health exam.  If you are 76 or older, have a CBE every year. Also consider having a breast X-ray (mammogram) every year.  If you have a family history of breast cancer, talk to your health care provider about genetic screening.  If you are  at high risk for breast cancer, talk to your health care provider about having an MRI and a mammogram every year.  Breast cancer gene (BRCA) assessment is recommended for women who have family members with BRCA-related cancers. BRCA-related cancers include:  Breast.  Ovarian.  Tubal.  Peritoneal cancers.  Results of the assessment will determine the need for genetic counseling and BRCA1 and BRCA2 testing. Cervical Cancer Routine pelvic examinations to screen for cervical cancer are no longer recommended for nonpregnant women who are considered low  risk for cancer of the pelvic organs (ovaries, uterus, and vagina) and who do not have symptoms. A pelvic examination may be necessary if you have symptoms including those associated with pelvic infections. Ask your health care provider if a screening pelvic exam is right for you.   The Pap test is the screening test for cervical cancer for women who are considered at risk.  If you had a hysterectomy for a problem that was not cancer or a condition that could lead to cancer, then you no longer need Pap tests.  If you are older than 65 years, and you have had normal Pap tests for the past 10 years, you no longer need to have Pap tests.  If you have had past treatment for cervical cancer or a condition that could lead to cancer, you need Pap tests and screening for cancer for at least 20 years after your treatment.  If you no longer get a Pap test, assess your risk factors if they change (such as having a new sexual partner). This can affect whether you should start being screened again.  Some women have medical problems that increase their chance of getting cervical cancer. If this is the case for you, your health care provider may recommend more frequent screening and Pap tests.  The human papillomavirus (HPV) test is another test that may be used for cervical cancer screening. The HPV test looks for the virus that can cause cell changes in the cervix. The cells collected during the Pap test can be tested for HPV.  The HPV test can be used to screen women 30 years of age and older. Getting tested for HPV can extend the interval between normal Pap tests from three to five years.  An HPV test also should be used to screen women of any age who have unclear Pap test results.  After 72 years of age, women should have HPV testing as often as Pap tests.  Colorectal Cancer  This type of cancer can be detected and often prevented.  Routine colorectal cancer screening usually begins at 72 years of  age and continues through 72 years of age.  Your health care provider may recommend screening at an earlier age if you have risk factors for colon cancer.  Your health care provider may also recommend using home test kits to check for hidden blood in the stool.  A small camera at the end of a tube can be used to examine your colon directly (sigmoidoscopy or colonoscopy). This is done to check for the earliest forms of colorectal cancer.  Routine screening usually begins at age 50.  Direct examination of the colon should be repeated every 5-10 years through 72 years of age. However, you may need to be screened more often if early forms of precancerous polyps or small growths are found. Skin Cancer  Check your skin from head to toe regularly.  Tell your health care provider about any new moles or changes in   moles, especially if there is a change in a mole's shape or color.  Also tell your health care provider if you have a mole that is larger than the size of a pencil eraser.  Always use sunscreen. Apply sunscreen liberally and repeatedly throughout the day.  Protect yourself by wearing long sleeves, pants, a wide-brimmed hat, and sunglasses whenever you are outside. HEART DISEASE, DIABETES, AND HIGH BLOOD PRESSURE   Have your blood pressure checked at least every 1-2 years. High blood pressure causes heart disease and increases the risk of stroke.  If you are between 75 years and 42 years old, ask your health care provider if you should take aspirin to prevent strokes.  Have regular diabetes screenings. This involves taking a blood sample to check your fasting blood sugar level.  If you are at a normal weight and have a low risk for diabetes, have this test once every three years after 72 years of age.  If you are overweight and have a high risk for diabetes, consider being tested at a younger age or more often. PREVENTING INFECTION  Hepatitis B  If you have a higher risk for  hepatitis B, you should be screened for this virus. You are considered at high risk for hepatitis B if:  You were born in a country where hepatitis B is common. Ask your health care provider which countries are considered high risk.  Your parents were born in a high-risk country, and you have not been immunized against hepatitis B (hepatitis B vaccine).  You have HIV or AIDS.  You use needles to inject street drugs.  You live with someone who has hepatitis B.  You have had sex with someone who has hepatitis B.  You get hemodialysis treatment.  You take certain medicines for conditions, including cancer, organ transplantation, and autoimmune conditions. Hepatitis C  Blood testing is recommended for:  Everyone born from [birth year] through 1964.  Anyone with known risk factors for hepatitis C. Sexually transmitted infections (STIs)  You should be screened for sexually transmitted infections (STIs) including gonorrhea and chlamydia if:  You are sexually active and are younger than 72 years of age.  You are older than 72 years of age and your health care provider tells you that you are at risk for this type of infection.  Your sexual activity has changed since you were last screened and you are at an increased risk for chlamydia or gonorrhea. Ask your health care provider if you are at risk.  If you do not have HIV, but are at risk, it may be recommended that you take a prescription medicine daily to prevent HIV infection. This is called pre-exposure prophylaxis (PrEP). You are considered at risk if:  You are sexually active and do not regularly use condoms or know the HIV status of your partner(s).  You take drugs by injection.  You are sexually active with a partner who has HIV. Talk with your health care provider about whether you are at high risk of being infected with HIV. If you choose to begin PrEP, you should first be tested for HIV. You should then be tested every 3 months for  as long as you are taking PrEP.  PREGNANCY   If you are premenopausal and you may become pregnant, ask your health care provider about preconception counseling.  If you may become pregnant, take 400 to 800 micrograms (mcg) of folic acid every day.  If you want to prevent pregnancy, talk to your  health care provider about birth control (contraception). OSTEOPOROSIS AND MENOPAUSE   Osteoporosis is a disease in which the bones lose minerals and strength with aging. This can result in serious bone fractures. Your risk for osteoporosis can be identified using a bone density scan.  If you are 15 years of age or older, or if you are at risk for osteoporosis and fractures, ask your health care provider if you should be screened.  Ask your health care provider whether you should take a calcium or vitamin D supplement to lower your risk for osteoporosis.  Menopause may have certain physical symptoms and risks.  Hormone replacement therapy may reduce some of these symptoms and risks. Talk to your health care provider about whether hormone replacement therapy is right for you.  HOME CARE INSTRUCTIONS   Schedule regular health, dental, and eye exams.  Stay current with your immunizations.   Do not use any tobacco products including cigarettes, chewing tobacco, or electronic cigarettes.  If you are pregnant, do not drink alcohol.  If you are breastfeeding, limit how much and how often you drink alcohol.  Limit alcohol intake to no more than 1 drink per day for nonpregnant women. One drink equals 12 ounces of beer, 5 ounces of wine, or 1 ounces of hard liquor.  Do not use street drugs.  Do not share needles.  Ask your health care provider for help if you need support or information about quitting drugs.  Tell your health care provider if you often feel depressed.  Tell your health care provider if you have ever been abused or do not feel safe at home. Document Released: 01/06/2011  Document Revised: 11/07/2013 Document Reviewed: 05/25/2013 [facility] Patient Information 2015 [place], [place]. This information is not intended to replace advice given to you by your health care provider. Make sure you discuss any questions you have with your health care provider. Pneumococcal Vaccine, Polyvalent suspension for injection What is this medicine? PNEUMOCOCCAL VACCINE, POLYVALENT (NEU mo KOK al vak SEEN, pol ee VEY luhnt) is a vaccine to prevent pneumococcus bacteria infection. These bacteria are a major cause of ear infections, 'Strep throat' infections, and serious pneumonia, meningitis, or blood infections worldwide. These vaccines help the body to produce antibodies (protective substances) that help your body defend against these bacteria. This vaccine is recommended for infants and young children. This vaccine will not treat an infection. This medicine may be used for other purposes; ask your health care provider or pharmacist if you have questions. COMMON BRAND NAME(S): Prevnar 13 What should I tell my health care provider before I take this medicine? They need to know if you have any of these conditions: -bleeding problems -fever -immune system problems -low platelet count in the blood -seizures -an unusual or allergic reaction to pneumococcal vaccine, diphtheria toxoid, other vaccines, latex, other medicines, foods, dyes, or preservatives -pregnant or trying to get pregnant -breast-feeding How should I use this medicine? This vaccine is for injection into a muscle. It is given by a health care professional. A copy of Vaccine Information Statements will be given before each vaccination. Read this sheet carefully each time. The sheet may change frequently. Talk to your pediatrician regarding the use of this medicine in children. While this drug may be prescribed for children as young as 2 weeks old for selected conditions, precautions do apply. Overdosage: If you think you have  taken too much of this medicine contact a poison control center or emergency room at once. NOTE: This medicine  is only for you. Do not share this medicine with others. What if I miss a dose? It is important not to miss your dose. Call your doctor or health care professional if you are unable to keep an appointment. What may interact with this medicine? -medicines for cancer chemotherapy -medicines that suppress your immune function -medicines that treat or prevent blood clots like warfarin, enoxaparin, and dalteparin -steroid medicines like prednisone or cortisone This list may not describe all possible interactions. Give your health care provider a list of all the medicines, herbs, non-prescription drugs, or dietary supplements you use. Also tell them if you smoke, drink alcohol, or use illegal drugs. Some items may interact with your medicine. What should I watch for while using this medicine? Mild fever and pain should go away in 3 days or less. Report any unusual symptoms to your doctor or health care professional. What side effects may I notice from receiving this medicine? Side effects that you should report to your doctor or health care professional as soon as possible: -allergic reactions like skin rash, itching or hives, swelling of the face, lips, or tongue -breathing problems -confused -fever over 102 degrees F -pain, tingling, numbness in the hands or feet -seizures -unusual bleeding or bruising -unusual muscle weakness Side effects that usually do not require medical attention (report to your doctor or health care professional if they continue or are bothersome): -aches and pains -diarrhea -fever of 102 degrees F or less -headache -irritable -loss of appetite -pain, tender at site where injected -trouble sleeping This list may not describe all possible side effects. Call your doctor for medical advice about side effects. You may report side effects to FDA at  1-800-FDA-1088. Where should I keep my medicine? This does not apply. This vaccine is given in a clinic, pharmacy, doctor's office, or other health care setting and will not be stored at home. NOTE: This sheet is a summary. It may not cover all possible information. If you have questions about this medicine, talk to your doctor, pharmacist, or health care provider.  2015, [employer]vier/[employer] Standard. (2008-09-05 10:17:22)

## 2014-06-03 LAB — NMR, LIPOPROFILE
Cholesterol: 233 mg/dL — ABNORMAL HIGH (ref 100–199)
HDL Cholesterol by NMR: 54 mg/dL (ref >39)
HDL Particle Number: 28.7 umol/L — ABNORMAL LOW (ref >=30.5)
LDL Particle Number: 1738 nmol/L — ABNORMAL HIGH (ref <1000)
LDL SIZE: 22.1 nm (ref >20.5)
LDL-C: 163 mg/dL — ABNORMAL HIGH (ref 0–99)
LP-IR SCORE: 46 — AB (ref <=45)
SMALL LDL PARTICLE NUMBER: 273 nmol/L (ref <=527)
Triglycerides by NMR: 80 mg/dL (ref 0–149)

## 2014-06-03 LAB — CMP14+EGFR
A/G RATIO: 2 (ref 1.1–2.5)
ALK PHOS: 68 IU/L (ref 39–117)
ALT: 8 IU/L (ref 0–32)
AST: 15 IU/L (ref 0–40)
Albumin: 4.3 g/dL (ref 3.5–4.8)
BUN/Creatinine Ratio: 19 (ref 11–26)
BUN: 14 mg/dL (ref 8–27)
CHLORIDE: 104 mmol/L (ref 97–108)
CO2: 25 mmol/L (ref 18–29)
Calcium: 8.9 mg/dL (ref 8.7–10.3)
Creatinine, Ser: 0.72 mg/dL (ref 0.57–1.00)
GFR calc Af Amer: 97 mL/min/{1.73_m2} (ref >59)
GFR, EST NON AFRICAN AMERICAN: 84 mL/min/{1.73_m2} (ref >59)
Globulin, Total: 2.2 g/dL (ref 1.5–4.5)
Glucose: 93 mg/dL (ref 65–99)
Potassium: 4.2 mmol/L (ref 3.5–5.2)
SODIUM: 142 mmol/L (ref 134–144)
Total Bilirubin: 0.2 mg/dL (ref 0.0–1.2)
Total Protein: 6.5 g/dL (ref 6.0–8.5)

## 2014-06-05 ENCOUNTER — Other Ambulatory Visit: Payer: Self-pay | Admitting: Nurse Practitioner

## 2014-06-05 MED ORDER — DOXYCYCLINE HYCLATE 100 MG PO TABS: 100.0000 mg | ORAL_TABLET | Freq: Two times a day (BID) | ORAL | Status: DC

## 2014-06-05 NOTE — Telephone Encounter (Signed)
Has ulcers on fingers that are swollen and red.   The arthritis doctor had given her doxycycline before.  She doesn't want to go to [facility] to see him.  Please send script to [facility].

## 2014-06-05 NOTE — Telephone Encounter (Signed)
Aware, script sent in. 

## 2014-06-05 NOTE — Telephone Encounter (Signed)
Doxy rx sent to pharmacy

## 2014-06-23 ENCOUNTER — Other Ambulatory Visit: Payer: Self-pay | Admitting: *Deleted

## 2014-06-23 DIAGNOSIS — K219 Gastro-esophageal reflux disease without esophagitis: Secondary | ICD-10-CM

## 2014-06-23 MED ORDER — OMEPRAZOLE 20 MG PO CPDR: 20.0000 mg | DELAYED_RELEASE_CAPSULE | Freq: Every day | ORAL | Status: DC

## 2014-07-18 DIAGNOSIS — S72141D Displaced intertrochanteric fracture of right femur, subsequent encounter for closed fracture with routine healing: Secondary | ICD-10-CM | POA: Diagnosis not present

## 2014-07-18 DIAGNOSIS — I10 Essential (primary) hypertension: Secondary | ICD-10-CM | POA: Diagnosis not present

## 2014-08-02 ENCOUNTER — Other Ambulatory Visit: Payer: Self-pay | Admitting: Nurse Practitioner

## 2014-08-03 NOTE — Telephone Encounter (Signed)
Last seen 06/02/14 MMM If approved route to nurse to call into [facility]

## 2014-08-03 NOTE — Telephone Encounter (Signed)
Please call in klonopin with 1 refills 

## 2014-08-04 NOTE — Telephone Encounter (Signed)
rx called into pharmacy

## 2014-08-10 DIAGNOSIS — I7300 Raynaud's syndrome without gangrene: Secondary | ICD-10-CM | POA: Diagnosis not present

## 2014-08-10 DIAGNOSIS — M810 Age-related osteoporosis without current pathological fracture: Secondary | ICD-10-CM | POA: Diagnosis not present

## 2014-08-10 DIAGNOSIS — M340 Progressive systemic sclerosis: Secondary | ICD-10-CM | POA: Diagnosis not present

## 2014-08-10 DIAGNOSIS — M150 Primary generalized (osteo)arthritis: Secondary | ICD-10-CM | POA: Diagnosis not present

## 2014-09-02 ENCOUNTER — Other Ambulatory Visit: Payer: Self-pay | Admitting: Nurse Practitioner

## 2014-09-04 ENCOUNTER — Ambulatory Visit (INDEPENDENT_AMBULATORY_CARE_PROVIDER_SITE_OTHER): Payer: Medicare Other | Admitting: Nurse Practitioner

## 2014-09-04 ENCOUNTER — Other Ambulatory Visit: Payer: Self-pay | Admitting: Nurse Practitioner

## 2014-09-04 ENCOUNTER — Encounter: Payer: Self-pay | Admitting: Nurse Practitioner

## 2014-09-04 VITALS — BP 148/68 | HR 73 | Temp 98.9°F | Ht 62.0 in | Wt 125.0 lb

## 2014-09-04 DIAGNOSIS — I7300 Raynaud's syndrome without gangrene: Secondary | ICD-10-CM

## 2014-09-04 DIAGNOSIS — E039 Hypothyroidism, unspecified: Secondary | ICD-10-CM | POA: Diagnosis not present

## 2014-09-04 DIAGNOSIS — M349 Systemic sclerosis, unspecified: Secondary | ICD-10-CM

## 2014-09-04 DIAGNOSIS — R300 Dysuria: Secondary | ICD-10-CM | POA: Diagnosis not present

## 2014-09-04 DIAGNOSIS — I10 Essential (primary) hypertension: Secondary | ICD-10-CM

## 2014-09-04 DIAGNOSIS — Z13820 Encounter for screening for osteoporosis: Secondary | ICD-10-CM | POA: Diagnosis not present

## 2014-09-04 DIAGNOSIS — R5383 Other fatigue: Secondary | ICD-10-CM | POA: Diagnosis not present

## 2014-09-04 DIAGNOSIS — E785 Hyperlipidemia, unspecified: Secondary | ICD-10-CM

## 2014-09-04 DIAGNOSIS — F329 Major depressive disorder, single episode, unspecified: Secondary | ICD-10-CM

## 2014-09-04 DIAGNOSIS — K219 Gastro-esophageal reflux disease without esophagitis: Secondary | ICD-10-CM

## 2014-09-04 DIAGNOSIS — M25512 Pain in left shoulder: Secondary | ICD-10-CM

## 2014-09-04 DIAGNOSIS — F32A Depression, unspecified: Secondary | ICD-10-CM

## 2014-09-04 LAB — POCT URINALYSIS DIPSTICK
BILIRUBIN UA: NEGATIVE
GLUCOSE UA: NEGATIVE
Ketones, UA: NEGATIVE
LEUKOCYTES UA: NEGATIVE
NITRITE UA: NEGATIVE
Protein, UA: NEGATIVE
RBC UA: NEGATIVE
Spec Grav, UA: >=1.03
UROBILINOGEN UA: NEGATIVE
pH, UA: 6

## 2014-09-04 LAB — POCT UA - MICROSCOPIC ONLY
BACTERIA, U MICROSCOPIC: NEGATIVE
CRYSTALS, UR, HPF, POC: NEGATIVE
Casts, Ur, LPF, POC: NEGATIVE
Epithelial cells, urine per micros: NEGATIVE
RBC, URINE, MICROSCOPIC: NEGATIVE
Yeast, UA: NEGATIVE

## 2014-09-04 NOTE — Patient Instructions (Signed)

## 2014-09-04 NOTE — Progress Notes (Signed)
Subjective:    Patient ID: Haley Trevino, female    DOB: 09-17-1940, 74 y.o.   MRN: [id number]  HPI   Patient here today for follow up of chronic medical problems: pt reports dysuria and urinary frequency that started about 2 weeks. She reports low energy and low appetite. She has not tried anything.   Sclerderma Getting worse- is being referred to [facility]- starting to get ulcers on fingers that get infected. Currently on doxy for finger infections- doesn't know how long she will be taking this. Having trouble walking due to so much pain. Raynauds Still has bad nerve pain and hurts to just touch anything. Has started on lyrica to help with nerve pain- could not tolerate gabapentin because it caused swelling. Has minimal swelling for lyrica but not like she had with gabapentin Hypothyroidism SYnthroid 47mg and doing well with that. Has fatigue but she thinks that is from everything nit just thyroid problems. Depression Decided to take a break from it because she has been on it so long- Sees psychiatrist next tomorrow and will probably start back on  It tomorrow. Hyperlipidemia-  Welchol is the only thing she can tolerate- statins make her hurt Hypertension On procardia which helps greatly with blood pressure and raynauds. Gerd Omeprazole works well to keep symptoms under control.   Review of Systems  Constitutional: Negative.   HENT: Negative.   Respiratory: Negative.   Cardiovascular: Negative.   Genitourinary: Negative.   Neurological: Negative.   Psychiatric/Behavioral: Negative.   All other systems reviewed and are negative.      Objective:   Physical Exam  Constitutional: She is oriented to person, place, and time. She appears well-developed and well-nourished.  HENT:  Nose: Nose normal.  Mouth/Throat: Oropharynx is clear and moist.  Eyes: EOM are normal.  Neck: Trachea normal, normal range of motion and full passive range of motion without pain. Neck supple. No JVD  present. Carotid bruit is not present. No thyromegaly present.  Cardiovascular: Normal rate, regular rhythm, normal heart sounds and intact distal pulses.  Exam reveals no gallop and no friction rub.   No murmur heard. Tips of fingers are cold to touch with slow cap refill.  Pulmonary/Chest: Effort normal and breath sounds normal.  Abdominal: Soft. Bowel sounds are normal. She exhibits no distension and no mass. There is no tenderness.  Musculoskeletal: Normal range of motion.  limps with gait but improving  Lymphadenopathy:    She has no cervical adenopathy.  Neurological: She is alert and oriented to person, place, and time. She has normal reflexes.  Skin: Skin is warm and dry.  Psychiatric: She has a normal mood and affect. Her behavior is normal. Judgment and thought content normal.    BP 148/68 mmHg  Pulse 73  Temp(Src) 98.9 F (37.2 C) (Oral)  Ht _0  (1.575 m)  Wt 125 lb (56.7 kg)  BMI 22.86 kg/m2  Results for orders placed or performed in visit on 09/04/14  POCT UA - Microscopic Only  Result Value Ref Range   WBC, Ur, HPF, POC 3-5    RBC, urine, microscopic negative    Bacteria, U Microscopic negative    Mucus, UA moderate    Epithelial cells, urine per micros negative    Crystals, Ur, HPF, POC negative    Casts, Ur, LPF, POC negative    Yeast, UA negative   POCT urinalysis dipstick  Result Value Ref Range   Color, UA gold    Clarity, UA clear  Glucose, UA negative    Bilirubin, UA negative    Ketones, UA negative    Spec Grav, UA >=1.030    Blood, UA negative    pH, UA 6.0    Protein, UA negative    Urobilinogen, UA negative    Nitrite, UA negative    Leukocytes, UA Negative         Assessment & Plan:   1. [name] Keep hands warm - NIFEdipine (PROCARDIA-XL/ADALAT-CC/NIFEDICAL-XL) 30 MG 24 hr tablet; Take 3 tablets (90 mg total) by mouth daily.  Dispense: 90 tablet; Refill: 1  2. Essential hypertension Do nont add salt to diet -  CMP14+EGFR  3. Gastroesophageal reflux disease without esophagitis Avoid spicy and fatty foods - omeprazole (PRILOSEC) 20 MG capsule; Take 1 capsule (20 mg total) by mouth every other day.  Dispense: 90 capsule; Refill: 1  4. Hypothyroidism, unspecified hypothyroidism type - SYNTHROID 50 MCG tablet; Take 1 tablet (50 mcg total) by mouth daily before breakfast.  Dispense: 90 tablet; Refill: 1  5. SCLERODERMA Keep follow up with specialist  6. Hyperlipidemia with target LDL less than 100 Low fta diet - NMR, lipoprofile  7. Depression Stress management - FLUoxetine (PROZAC) 40 MG capsule; Take 1 capsule (40 mg total) by mouth daily.  Dispense: 90 capsule; Refill: 1    Labs pending Health maintenance reviewed Diet and exercise encouraged Continue all meds Follow up  In 3 month   [place], FNP

## 2014-09-05 LAB — NMR, LIPOPROFILE
Cholesterol: 205 mg/dL — ABNORMAL HIGH (ref 100–199)
HDL Cholesterol by NMR: 44 mg/dL (ref >39)
HDL Particle Number: 26.3 umol/L — ABNORMAL LOW (ref >=30.5)
LDL PARTICLE NUMBER: 1505 nmol/L — AB (ref <1000)
LDL SIZE: 21.5 nm (ref >20.5)
LDL-C: 126 mg/dL — ABNORMAL HIGH (ref 0–99)
LP-IR SCORE: 47 — AB (ref <=45)
Small LDL Particle Number: 582 nmol/L — ABNORMAL HIGH (ref <=527)
Triglycerides by NMR: 176 mg/dL — ABNORMAL HIGH (ref 0–149)

## 2014-09-05 LAB — CMP14+EGFR
A/G RATIO: 1.8 (ref 1.1–2.5)
ALK PHOS: 59 IU/L (ref 39–117)
ALT: 8 IU/L (ref 0–32)
AST: 12 IU/L (ref 0–40)
Albumin: 4.2 g/dL (ref 3.5–4.8)
BUN / CREAT RATIO: 15 (ref 11–26)
BUN: 11 mg/dL (ref 8–27)
CO2: 21 mmol/L (ref 18–29)
CREATININE: 0.72 mg/dL (ref 0.57–1.00)
Calcium: 8.6 mg/dL — ABNORMAL LOW (ref 8.7–10.3)
Chloride: 104 mmol/L (ref 97–108)
GFR, EST AFRICAN AMERICAN: 96 mL/min/{1.73_m2} (ref >59)
GFR, EST NON AFRICAN AMERICAN: 83 mL/min/{1.73_m2} (ref >59)
GLOBULIN, TOTAL: 2.4 g/dL (ref 1.5–4.5)
Glucose: 80 mg/dL (ref 65–99)
Potassium: 3.8 mmol/L (ref 3.5–5.2)
SODIUM: 141 mmol/L (ref 134–144)
Total Protein: 6.6 g/dL (ref 6.0–8.5)

## 2014-09-05 LAB — ANEMIA PROFILE B
BASOS ABS: 0 10*3/uL (ref 0.0–0.2)
Basos: 0 %
Eos: 3 %
Eosinophils Absolute: 0.2 10*3/uL (ref 0.0–0.4)
Ferritin: 12 ng/mL — ABNORMAL LOW (ref 15–150)
Folate: 6.3 ng/mL (ref >3.0)
HCT: 33.7 % — ABNORMAL LOW (ref 34.0–46.6)
Hemoglobin: 11.2 g/dL (ref 11.1–15.9)
IMMATURE GRANS (ABS): 0 10*3/uL (ref 0.0–0.1)
IMMATURE GRANULOCYTES: 0 %
IRON: 73 ug/dL (ref 27–139)
Iron Saturation: 23 % (ref 15–55)
LYMPHS ABS: 1.9 10*3/uL (ref 0.7–3.1)
LYMPHS: 25 %
MCH: 27.9 pg (ref 26.6–33.0)
MCHC: 33.2 g/dL (ref 31.5–35.7)
MCV: 84 fL (ref 79–97)
MONOCYTES: 7 %
Monocytes Absolute: 0.5 10*3/uL (ref 0.1–0.9)
NEUTROS PCT: 65 %
Neutrophils Absolute: 4.9 10*3/uL (ref 1.4–7.0)
PLATELETS: 332 10*3/uL (ref 150–379)
RBC: 4.01 x10E6/uL (ref 3.77–5.28)
RDW: 16.6 % — AB (ref 12.3–15.4)
RETIC CT PCT: 0.7 % (ref 0.6–2.6)
TIBC: 311 ug/dL (ref 250–450)
UIBC: 238 ug/dL (ref 118–369)
VITAMIN B 12: 344 pg/mL (ref 211–946)
WBC: 7.5 10*3/uL (ref 3.4–10.8)

## 2014-09-05 LAB — THYROID PANEL WITH TSH
FREE THYROXINE INDEX: 2.7 (ref 1.2–4.9)
T3 Uptake Ratio: 27 % (ref 24–39)
T4, Total: 10.1 ug/dL (ref 4.5–12.0)
TSH: 2.83 u[IU]/mL (ref 0.450–4.500)

## 2014-09-14 ENCOUNTER — Telehealth: Payer: Self-pay | Admitting: Nurse Practitioner

## 2014-09-15 NOTE — Telephone Encounter (Signed)
Stp-she is requesting copy of labs be sent to Dr.[name] her rheumatologist, all labs from 2/29 routed to [facility] via Epic.Pt aware.

## 2014-09-18 DIAGNOSIS — M7542 Impingement syndrome of left shoulder: Secondary | ICD-10-CM | POA: Diagnosis not present

## 2014-09-20 ENCOUNTER — Encounter: Payer: Self-pay | Admitting: Pharmacist

## 2014-09-20 ENCOUNTER — Ambulatory Visit (INDEPENDENT_AMBULATORY_CARE_PROVIDER_SITE_OTHER): Payer: Medicare Other

## 2014-09-20 ENCOUNTER — Other Ambulatory Visit: Payer: Self-pay | Admitting: Nurse Practitioner

## 2014-09-20 ENCOUNTER — Ambulatory Visit (INDEPENDENT_AMBULATORY_CARE_PROVIDER_SITE_OTHER): Payer: Medicare Other | Admitting: Pharmacist

## 2014-09-20 VITALS — Ht 62.0 in | Wt 124.0 lb

## 2014-09-20 DIAGNOSIS — M80059D Age-related osteoporosis with current pathological fracture, unspecified femur, subsequent encounter for fracture with routine healing: Secondary | ICD-10-CM

## 2014-09-20 DIAGNOSIS — M84459D Pathological fracture, hip, unspecified, subsequent encounter for fracture with routine healing: Secondary | ICD-10-CM | POA: Diagnosis not present

## 2014-09-20 DIAGNOSIS — M810 Age-related osteoporosis without current pathological fracture: Secondary | ICD-10-CM

## 2014-09-20 DIAGNOSIS — Z780 Asymptomatic menopausal state: Secondary | ICD-10-CM | POA: Diagnosis not present

## 2014-09-20 DIAGNOSIS — Z13820 Encounter for screening for osteoporosis: Secondary | ICD-10-CM

## 2014-09-20 MED ORDER — NIFEDIPINE ER OSMOTIC RELEASE 90 MG PO TB24: 90.0000 mg | ORAL_TABLET | Freq: Every day | ORAL | Status: DC

## 2014-09-20 NOTE — Progress Notes (Signed)
Patient ID: VELVIA MEHRER, female   DOB: 08-16-1940, 74 y.o.   MRN: [id number]  [facility]osis [facility] Current Height: Height: 5\' 2"  (157.5 cm)      Max Lifetime Height:  5\' 4"  Current Weight: Weight: 124 lb (56.246 kg)       Ethnicity:Caucasian  HPI: Patient with osteoporosis.  Fractured right hip about 6 months ago.    Back Pain?  No       Kyphosis?  No Prior fracture?  Yes - right hip Med(s) for Osteoporosis/Osteopenia:  none Med(s) previously tried for Osteoporosis/Osteopenia:  none                                                             PMH: Age at menopause:  84's Hysterectomy?  Yes Oophorectomy?  Yes Steroid Use?  No Thyroid med?  No History of cancer?  Yes - melanoma in her 30's History of digestive disorders (ie Crohn's)?  Yes - history of GERD - taking chronic PPI Current or previous eating disorders?  No Last Vitamin D Result:  30.7 (05/30/2013) Last GFR Result:  83 (09-04-2014) Calcium = 8.6 (09/04/2014)   FH/SH: Family history of osteoporosis?  No Parent with history of hip fracture?  Yes - mother Family history of breast cancer?  No Exercise?  Yes - walking 4 times per week Smoking?  No Alcohol?  No    Calcium Assessment Calcium Intake  # of servings/day  Calcium mg  Milk (8 oz) 1  x  300  = 300mg   Yogurt (4 oz) 0 x  200 = 0  Cheese (1 oz) 1 x  200 = 200mg   Other Calcium sources   250mg   Ca supplement 0 = 0   Estimated calcium intake per day 750mg     DEXA Results Date of Test T-Score for AP Spine L1-L4 T-Score for Total Left Hip T-Score for Total Right Hip  09/20/2014 -1.6 -3.1 --  06/09/2012 -1.4 -2.7 --  11/23/2006 -2.1 -2.4 --        Assessment: Osteoporosis Medication management - patient was taking nifedipine 90mg  tablets but last rx was changed to 30mg  take 3 tablets daily she would like switched back if ok  Recommendations: 1.  Discussed starting Forteo.  Discussed pros and cons.  Will check PTH and calcium before starting.  Also  will check to make sure no problems with scleraderma.   2.  recommend calcium 1200mg  daily through supplementation or diet.  3.  continue weight bearing exercise - 30 minutes at least 4 days per week.   4.  Counseled and educated about fall risk and prevention. 5.  Spoke with patient's pharmacy and nifedipine was changed back to 90mg  1 tablet daily.    Recheck DEXA:  Plan to recheck in 1 to 2 years  Time spent counseling patient:  30 minutes   Cherre Robins, PharmD, CPP

## 2014-09-20 NOTE — Patient Instructions (Signed)
Exercise for Strong Bones  Exercise is important to build and maintain strong bones / bone density.  There are 2 types of exercises that are important to building and maintaining strong bones:  Weight- bearing and muscle-stregthening.  Weight-bearing Exercises  These exercises include activities that make you move against gravity while staying upright. Weight-bearing exercises can be high-impact or low-impact.  High-impact weight-bearing exercises help build bones and keep them strong. If you have broken a bone due to osteoporosis or are at risk of breaking a bone, you may need to avoid high-impact exercises. If you're not sure, you should check with your healthcare provider.  Examples of high-impact weight-bearing exercises are: Dancing  Doing high-impact aerobics  Hiking  Jogging/running  Jumping Rope  Stair climbing  Tennis  Low-impact weight-bearing exercises can also help keep bones strong and are a safe alternative if you cannot do high-impact exercises.   Examples of low-impact weight-bearing exercises are: Using elliptical training machines  Doing low-impact aerobics  Using stair-step machines  Fast walking on a treadmill or outside   Muscle-Strengthening Exercises These exercises include activities where you move your body, a weight or some other resistance against gravity. They are also known as resistance exercises and include: Lifting weights  Using elastic exercise bands  Using weight machines  Lifting your own body weight  Functional movements, such as standing and rising up on your toes  Yoga and Pilates can also improve strength, balance and flexibility. However, certain positions may not be safe for people with osteoporosis or those at increased risk of broken bones. For example, exercises that have you bend forward may increase the chance of breaking a bone in the spine.   Non-Impact Exercises There are other types of exercises that can help  prevent falls.  Non-impact exercises can help you to improve balance, posture and how well you move in everyday activities. Some of these exercises include: Balance exercises that strengthen your legs and test your balance, such as Tai Chi, can decrease your risk of falls.  Posture exercises that improve your posture and reduce rounded or "sloping" shoulders can help you decrease the chance of breaking a bone, especially in the spine.  Functional exercises that improve how well you move can help you with everyday activities and decrease your chance of falling and breaking a bone. For example, if you have trouble getting up from a chair or climbing stairs, you should do these activities as exercises.   **A physical therapist can teach you balance, posture and functional exercises. He/she can also help you learn which exercises are safe and appropriate for you.  [place] has a physical therapy office in [place] in front of our office and referrals can be made for assessments and treatment as needed and strength and balance training.  If you would like to have an assessment with [place] and our physical therapy team please let a nurse or provider know.      Osteoporosis Osteoporosis happens when your bones become weak because of bone loss. Weak bones can break (fracture) more easily with slips or falls. You are more likely to develop osteoporosis if:  You are a woman.  You are older than 50 years.  You are white or Asian.  You are very thin.  Someone in your family has had osteoporosis.  You smoke or use nicotine. CAUSES   Smoking.  Too much drinking.  Being a weight below normal.  Not being active.  Not going outside in the sun  enough.  Certain medical conditions, such as diabetes or Crohn disease.  Certain medicines, such as steroids or antiseizure medicines. TREATMENT  The goal of treatment is to strengthen bones. There are different types of medicines that help your  bones. Some medicines make your bones more solid. Some medicines help to slow down how much bone you lose. Your doctor may check to see if you are getting enough calcium and vitamin D in your diet. PREVENTION   Make sure you get enough calcium and vitamin D.  Make sure you exercise often.  If you smoke, quit. MAKE SURE YOU:  Understand these instructions.  Will watch your condition.  Will get help right away if you are not doing well or get worse. Document Released: 09/15/2011 Document Reviewed: 09/15/2011 [facility] Patient Information 2015 [place]. This information is not intended to replace advice given to you by your health care provider. Make sure you discuss any questions you have with your health care provider.   Calcium & Vitamin D: The Facts  Why is calcium and vitamin D consumption important? Calcium: . Most Americans do not consume adequate amounts of calcium! Calcium is required for proper muscle function, nerve communication, bone support, and many other functions in the body.  . The body uses bones as a source of calcium. Bones 'remodel' themselves continuously - the body constantly breaks bone down to release calcium and rebuilds bones by replacing calcium in the bone later.  . As we get older, the rate of bone breakdown occurs faster than bone rebuilding which could lead to osteopenia, osteoporosis, and possible fractures.   Vitamin D: . People naturally make vitamin D in the body when sunlight hits the skin and triggers a process that leads to vitamin D production. This natural vitamin D production requires about 10-15 minutes of sun exposure on the hands, arms, and face at least 2-3 times per week. However, due to decreased sun exposure and the use of sunscreen, most people will need to get additional vitamin D from foods or supplements. Your doctor can measure your body's vitamin D level through a simple blood test to determine your daily vitamin D needs.   . Vitamin D is used to help the body absorb calcium, maintain bone health, help the immune system, and reduce inflammation. It also plays a role in muscle performance, balance and risk of falling.  . Vitamin D deficiency can lead to osteomalacia or softening of the bones, bone pain, and muscle weakness.   The recommended daily allowance of Calcium and Vitamin D varies for different age groups. Age group Calcium (mg) Vitamin D (IU)  Females and Males: Age 15-50 1000 mg 600 IU  Females: Age 49- 24 1200 mg 600 IU  Males: Age 20-70 1000 mg 600 IU  Females and Males: Age 23+ 1200 mg 800 IU  Pregnant/lactating Females age 79-50 1000 mg 600 IU   How much Calcium do you get in your diet? Calcium Intake # of servings per day  Total calcium (mg)  Skim milk, 2% milk (1 cup) _________ x 300 mg   Yogurt (1 small container) _________ x 200 mg   Cheese (1oz) _________ x 200 mg   Cottage Cheese (1 cup)             ________ x 150 mg   Almond milk (1 cup) _________ x 450 mg   Fortified Orange Juice (1 cup) _________ x 300 mg   Broccoli or spinach ( 1 cup) _________ x 100  mg   Salmon (3 oz) _________ x 150 mg    Almonds (1/4 cup) _______ x 90 mg      How do we get Calcium and Vitamin D in our diet? Calcium: . Obtaining calcium from the diet is the most preferred way to reach the recommended daily goal. If this goal is not reached through diet, calcium supplements are available.  . Calcium is found in many foods including: dairy products, dark leafy vegetables (like broccoli, kale, and spinach), fish, and fortified products like juices and cereals.  . The food label will have a %DV (percent daily value) listed showing the amount of calcium per serving. To determine the total mg per serving, simply replace the % with zero (0).  For example, Almond Breeze almond milk contains 45% DV of calcium or $RemoveBe'450mg'wQlgRRDEd$  per 1 cup.  . You can increase the amount of calcium in your diet by using more calcium products in your  daily meals. Use yogurt and fruit to make smoothies or use yogurt to top baked potatoes or make whipped potatoes. Sprinkle low fat cheese onto salads or into egg white omelets. You can even add non-fat dry milk powder ($RemoveBefo'300mg'EnJindptPXK$  calcium per 1/3 cup) to hot cereals, meat loaf, soups, or potatoes.  . Calcium supplements come in many forms including tablets, chewables, and gummies. Be sure to read the label to determine the correct number of tablets per serving and whether or not to take the supplement with food.  . Calcium carbonate products (Oscal, Caltrate, and Viactiv) are generally better absorbed when taken with food while calcium citrate products like Citracal can be taken with or without food.  . The body can only absorb about 600 mg of calcium at one time. It is recommended to take calcium supplements in small amounts several times per day.  However, taking it all at once is better than not taking it at all. . Increasing your intake of calcium is essential for bone health, but may also lead to some side effects like constipation, increased gas, bloating or abdominal cramping. To help reduce these side effects, start with 1 tablet per day and slowly increase your intake of the supplement to the recommended doses. It is also recommended that you drink plenty of water each day. Vitamin D: . Very few foods naturally contain vitamin D. However, it is found in saltwater fish (like tuna, salmon and mackerel), beef liver, egg yolks, cheese and vitamin D fortified foods (like yogurt, cereals, orange juice and milk) . The amount of vitamin D in each food or product is listed as %DV on the product label. To determine the total amount of vitamin D per serving, drop the % sign and multiply the number by 4. For example, 1 cup of Almond Breeze almond milk contains 25% DV vitamin D or 100 IU per serving (25 x 4 =100). . Vitamin D is also found in multivitamins and supplements and may be listed as ergocalciferol (vitamin  D2) or cholecalciferol (vitamin D3). Each of these forms of vitamin D are equivalent and the daily recommended intake will vary based on your age and the vitamin D levels in your body. Follow your doctor's recommendation for vitamin D intake.       Fall Prevention and Home Safety Falls cause injuries and can affect all age groups. It is possible to use preventive measures to significantly decrease the likelihood of falls. There are many simple measures which can make your home safer and prevent falls. OUTDOORS  Repair cracks and edges of walkways and driveways.  Remove high doorway thresholds.  Trim shrubbery on the main path into your home.  Have good outside lighting.  Clear walkways of tools, rocks, debris, and clutter.  Check that handrails are not broken and are securely fastened. Both sides of steps should have handrails.  Have leaves, snow, and ice cleared regularly.  Use sand or salt on walkways during winter months.  In the garage, clean up grease or oil spills. BATHROOM  Install night lights.  Install grab bars by the toilet and in the tub and shower.  Use non-skid mats or decals in the tub or shower.  Place a plastic non-slip stool in the shower to sit on, if needed.  Keep floors dry and clean up all water on the floor immediately.  Remove soap buildup in the tub or shower on a regular basis.  Secure bath mats with non-slip, double-sided rug tape.  Remove throw rugs and tripping hazards from the floors. BEDROOMS  Install night lights.  Make sure a bedside light is easy to reach.  Do not use oversized bedding.  Keep a telephone by your bedside.  Have a firm chair with side arms to use for getting dressed.  Remove throw rugs and tripping hazards from the floor. KITCHEN  Keep handles on pots and pans turned toward the center of the stove. Use back burners when possible.  Clean up spills quickly and allow time for drying.  Avoid walking on wet  floors.  Avoid hot utensils and knives.  Position shelves so they are not too high or low.  Place commonly used objects within easy reach.  If necessary, use a sturdy step stool with a grab bar when reaching.  Keep electrical cables out of the way.  Do not use floor polish or wax that makes floors slippery. If you must use wax, use non-skid floor wax.  Remove throw rugs and tripping hazards from the floor. STAIRWAYS  Never leave objects on stairs.  Place handrails on both sides of stairways and use them. Fix any loose handrails. Make sure handrails on both sides of the stairways are as long as the stairs.  Check carpeting to make sure it is firmly attached along stairs. Make repairs to worn or loose carpet promptly.  Avoid placing throw rugs at the top or bottom of stairways, or properly secure the rug with carpet tape to prevent slippage. Get rid of throw rugs, if possible.  Have an electrician put in a light switch at the top and bottom of the stairs. OTHER FALL PREVENTION TIPS  Wear low-heel or rubber-soled shoes that are supportive and fit well. Wear closed toe shoes.  When using a stepladder, make sure it is fully opened and both spreaders are firmly locked. Do not climb a closed stepladder.  Add color or contrast paint or tape to grab bars and handrails in your home. Place contrasting color strips on first and last steps.  Learn and use mobility aids as needed. Install an electrical emergency response system.  Turn on lights to avoid dark areas. Replace light bulbs that burn out immediately. Get light switches that glow.  Arrange furniture to create clear pathways. Keep furniture in the same place.  Firmly attach carpet with non-skid or double-sided tape.  Eliminate uneven floor surfaces.  Select a carpet pattern that does not visually hide the edge of steps.  Be aware of all pets. OTHER HOME SAFETY TIPS  Set the water temperature for  120 F (48.8 C).  Keep  emergency numbers on or near the telephone.  Keep smoke detectors on every level of the home and near sleeping areas. Document Released: 06/13/2002 Document Revised: 12/23/2011 Document Reviewed: 09/12/2011 [facility] Patient Information 2015 [place], [place]. This information is not intended to replace advice given to you by your health care provider. Make sure you discuss any questions you have with your health care provider.

## 2014-09-21 LAB — CMP14+EGFR
A/G RATIO: 1.6 (ref 1.1–2.5)
ALK PHOS: 64 IU/L (ref 39–117)
ALT: 12 IU/L (ref 0–32)
AST: 16 IU/L (ref 0–40)
Albumin: 3.8 g/dL (ref 3.5–4.8)
BUN/Creatinine Ratio: 16 (ref 11–26)
BUN: 13 mg/dL (ref 8–27)
CALCIUM: 8.8 mg/dL (ref 8.7–10.3)
CHLORIDE: 104 mmol/L (ref 97–108)
CO2: 23 mmol/L (ref 18–29)
CREATININE: 0.82 mg/dL (ref 0.57–1.00)
GFR, EST AFRICAN AMERICAN: 82 mL/min/{1.73_m2} (ref >59)
GFR, EST NON AFRICAN AMERICAN: 71 mL/min/{1.73_m2} (ref >59)
GLUCOSE: 87 mg/dL (ref 65–99)
Globulin, Total: 2.4 g/dL (ref 1.5–4.5)
Potassium: 4.6 mmol/L (ref 3.5–5.2)
Sodium: 141 mmol/L (ref 134–144)
TOTAL PROTEIN: 6.2 g/dL (ref 6.0–8.5)

## 2014-09-21 LAB — PARATHYROID HORMONE, INTACT (NO CA): PTH: 33 pg/mL (ref 15–65)

## 2014-09-30 ENCOUNTER — Other Ambulatory Visit: Payer: Self-pay | Admitting: Nurse Practitioner

## 2014-10-02 NOTE — Telephone Encounter (Signed)
Last seen 09/04/14 MMM If approved route to nurse to call into [facility]

## 2014-10-20 DIAGNOSIS — M7542 Impingement syndrome of left shoulder: Secondary | ICD-10-CM | POA: Diagnosis not present

## 2014-12-05 DIAGNOSIS — R0602 Shortness of breath: Secondary | ICD-10-CM | POA: Diagnosis not present

## 2014-12-05 DIAGNOSIS — M349 Systemic sclerosis, unspecified: Secondary | ICD-10-CM | POA: Diagnosis not present

## 2014-12-08 ENCOUNTER — Ambulatory Visit (INDEPENDENT_AMBULATORY_CARE_PROVIDER_SITE_OTHER): Payer: Medicare Other | Admitting: Pharmacist

## 2014-12-08 ENCOUNTER — Encounter: Payer: Self-pay | Admitting: Pharmacist

## 2014-12-08 VITALS — BP 136/72 | HR 75 | Ht 61.5 in | Wt 122.0 lb

## 2014-12-08 DIAGNOSIS — M7542 Impingement syndrome of left shoulder: Secondary | ICD-10-CM | POA: Insufficient documentation

## 2014-12-08 DIAGNOSIS — Z0000 Encounter for general adult medical examination without abnormal findings: Secondary | ICD-10-CM

## 2014-12-08 NOTE — Patient Instructions (Signed)
  Haley Trevino , Thank you for taking time to come for your Medicare Wellness Visit. I appreciate your ongoing commitment to your health goals. Please review the following plan we discussed and let me know if I can assist you in the future.     Increase non-starchy vegetables - carrots, green bean, squash, zucchini, tomatoes, onions, peppers, spinach and other green leafy vegetables, cabbage, lettuce, cucumbers, asparagus, okra (not fried), eggplant limit sugar and processed foods (cakes, cookies, ice cream, crackers and chips) Increase fresh fruit but limit serving sizes 1/2 cup or about the size of tennis or baseball limit red meat to no more than 1-2 times per week (serving size about the size of your palm) Choose whole grains / lean proteins - whole wheat bread, quinoa, whole grain rice (1/2 cup), fish, chicken, [place]  Increase protein - peanut butter, Greek yogurt, protein shakes, eggs, nuts - almonds, pistachios, peanuts    This is a list of the screening recommended for you and due dates:  Health Maintenance  Topic Date Due  . Flu Shot  02/05/2015  . Pneumonia vaccines (2 of 2 - PPSV23) 06/24/2021  . Tetanus Vaccine  07/08/2015  . DEXA scan (bone density measurement)  09/19/2016  . Colon Cancer Screening  06/23/2018  . Shingles Vaccine  Completed

## 2014-12-08 NOTE — Progress Notes (Signed)
Patient ID: Haley Trevino, female   DOB: 1941/02/04, 74 y.o.   MRN: [id number]    Subjective:   Haley Trevino is a 74 y.o. female who presents for an Initial Medicare Annual Wellness Visit.  Patient also states that she discussed treatment options - Forteo and Reclast - with Dr Amil Amen and Dr Manuella Ghazi and they both thought that Reclast would be best option for her.   Current Medications (verified) Outpatient Encounter Prescriptions as of 12/08/2014  Medication Sig  . clonazePAM (KLONOPIN) 0.5 MG tablet TAKE 1 TABLET TWICE DAILY AS NEEDED FOR ANXIETY  . FLUoxetine (PROZAC) 40 MG capsule Take 1 capsule (40 mg total) by mouth daily.  . naproxen sodium (ANAPROX) 220 MG tablet Take 1 tablet by mouth 2 (two) times daily as needed.  Marland Kitchen NIFEdipine (PROCARDIA XL) 90 MG 24 hr tablet Take 1 tablet (90 mg total) by mouth daily.  Marland Kitchen omeprazole (PRILOSEC) 20 MG capsule Take 1 capsule (20 mg total) by mouth daily.  . Probiotic Product (PROBIOTIC DAILY PO) Take 1 capsule by mouth daily.  . sildenafil (REVATIO) 20 MG tablet Take 20 mg by mouth 2 (two) times daily.  Marland Kitchen SYNTHROID 50 MCG tablet Take 1 tablet (50 mcg total) by mouth daily before breakfast.  . [DISCONTINUED] Probiotic Product ([place]) CAPS Take 1 capsule by mouth daily.  . [DISCONTINUED] Multiple Vitamin (MULTI VITAMIN DAILY PO) Take 1 tablet by mouth daily.   No facility-administered encounter medications on file as of 12/08/2014.    Allergies (verified) Dilaudid; Codeine; Gabapentin; Lyrica; and Methylprednisolone   History: Past Medical History  Diagnosis Date  . Raynaud disease   . Scleroderma   . Osteoarthritis   . Dyspnea   . Thyroid disease     hypothyroidism  . Cataract    Past Surgical History  Procedure Laterality Date  . Cholecystectomy  1973  . Total abdominal hysterectomy  1974  . Breast enhancement surgery  1975  . Hand surgery  08/2012; 11/2012  . Melanoma excision      at 86 yrs of age  . Orif hip  fracture Right 02/22/2014    Procedure: OPEN REDUCTION INTERNAL FIXATION RIGHT HIP;  Surgeon: Sanjuana Kava, MD;  Location: [facility];  Service: Orthopedics;  Laterality: Right;   Family History  Problem Relation Age of Onset  . Pancreatic cancer Father   . Raynaud syndrome Father   . Rashes / Skin problems Daughter     possibly scleraderma  . Hip fracture Mother   . Mental illness Mother     attempted suicide at 59 yo   Social History   Occupational History  . retired Occupational psychologist at [employer] Topics  . Smoking status: Never Smoker   . Smokeless tobacco: Never Used     Comment: passive tobacco smoke exposure  . Alcohol Use: No  . Drug Use: No  . Sexual Activity: Yes   Do you feel safe at home?  Yes  Dietary issues and exercise activities: Current Exercise Habits:: Home exercise routine, Type of exercise: Other - see comments (uses stationary bike), Time (Minutes): 15, Frequency (Times/Week): 5, Weekly Exercise (Minutes/Week): 75, Intensity: Moderate  Current Dietary habits:  Trying to increase protein intake and maintain current weight.   Objective:    Today's Vitals   12/08/14 1439 12/08/14 1440  BP: 136/72   Pulse: 75   Height: 5' 1.5" (1.562 m)   Weight: 122 lb (55.339 kg)   PainSc:  5   PainLoc:  Hip   Body mass index is 22.68 kg/(m^2).  Activities of Daily Living In your present state of health, do you have any difficulty performing the following activities: 12/08/2014 02/22/2014  Hearing? N N  Vision? N N  Difficulty concentrating or making decisions? N N  Walking or climbing stairs? Y Y  Dressing or bathing? N N  Doing errands, shopping? N N  Preparing Food and eating ? N -  Using the Toilet? N -  In the past six months, have you accidently leaked urine? N -  Do you have problems with loss of bowel control? N -  Managing your Medications? N -  Managing your Finances? N -  Housekeeping or managing your Housekeeping? Y -   Are there  smokers in your home (other than you)? No   Cardiac Risk Factors include: advanced age (>29men, >1 women)  Depression Screen PHQ 2/9 Scores 12/08/2014 09/04/2014 06/02/2014 01/23/2014  PHQ - 2 Score 2 0 2 0  PHQ- 9 Score 3 - 9 -    Fall Risk Fall Risk  12/08/2014 09/04/2014 06/02/2014 01/23/2014  Falls in the past year? Yes Yes Yes No  Number falls in past yr: 2 or more 1 1 -  Injury with Fall? Yes Yes Yes -  Risk Factor Category  High Fall Risk - - -  Risk for fall due to : History of fall(s) - - -    Cognitive Function: MMSE - Mini Mental State Exam 12/08/2014  Orientation to time 5  Orientation to Place 5  Registration 3  Attention/ Calculation 5  Recall 3  Language- name 2 objects 2  Language- repeat 1  Language- follow 3 step command 3  Language- read & follow direction 1  Write a sentence 1  Copy design 1  Total score 30    Immunizations and Health Maintenance Immunization History  Administered Date(s) Administered  . Influenza,inj,Quad PF,36+ Mos 04/06/2013, 04/19/2014  . Pneumococcal Conjugate-13 06/02/2014  . Tdap 07/07/2005  . Zoster 08/06/2009   There are no preventive care reminders to display for this patient.  Patient Care Team: [name], FNP as PCP - General (Nurse Practitioner) Clarene Essex, MD as Consulting Physician (Gastroenterology) Philemon Kingdom, MD as Consulting Physician (Internal Medicine) Leigh Aurora, MD as Consulting Physician (Rheumatology) Peggyann Juba, MD as Referring Physician (Orthopedic Surgery) Artis Delay, MD as Referring Physician (Internal Medicine) Justice Britain, MD as Consulting Physician (Orthopedic Surgery)  Indicate any recent Medical Services you may have received from other than [facility] providers in the past year (date may be approximate).    Assessment:    Annual Wellness Visit  Osteoporosis   Screening Tests Health Maintenance  Topic Date Due  . INFLUENZA VACCINE  02/05/2015  . PNA vac Low Risk Adult (2 of 2  - PPSV23) 06/03/2015  . TETANUS/TDAP  07/08/2015  . DEXA SCAN  09/19/2016  . COLONOSCOPY  06/23/2018  . ZOSTAVAX  Completed        Plan:   During the course of the visit Haley Trevino was educated and counseled about the following appropriate screening and preventive services:   Vaccines to include Pneumoccal, Influenza, Hepatitis B, Td, Zostavax - all vaccine UTD except Hep B  Colorectal cancer screening - colonoscopy UTD - given FOBT in office today to return later  Cardiovascular disease screening - BP at goal today.  Triglycerides were elevated at last check.  Handout given and discussed about dietary changes to make to help decrease triglycerides.  Diabetes screening - UTD  Bone Denisty / Osteoporosis Screening - UTD.   Plan to verify cost of Reclast and then set up appt at [facility] for adminstration  Mammogram - Patient refuses.  patient has breast impants and mammograms are painful.  Past mammograms have been negative  Glaucoma screening / Diabetic Eye Exam - UTD goes to [facility] but does not know who she sees there.    Nutrition counseling - discussed limiting CHO in diet and increasing lean proteins.  Recommended patient maintain current weight.   Advanced Directives - UTD   Goals    None       Patient Instructions (the written plan) were given to the patient.   Cherre Robins, [facility]   12/09/2014

## 2014-12-13 DIAGNOSIS — M150 Primary generalized (osteo)arthritis: Secondary | ICD-10-CM | POA: Diagnosis not present

## 2014-12-13 DIAGNOSIS — M810 Age-related osteoporosis without current pathological fracture: Secondary | ICD-10-CM | POA: Diagnosis not present

## 2014-12-13 DIAGNOSIS — M340 Progressive systemic sclerosis: Secondary | ICD-10-CM | POA: Diagnosis not present

## 2014-12-13 DIAGNOSIS — I7300 Raynaud's syndrome without gangrene: Secondary | ICD-10-CM | POA: Diagnosis not present

## 2014-12-19 ENCOUNTER — Other Ambulatory Visit: Payer: Self-pay | Admitting: Family Medicine

## 2014-12-19 ENCOUNTER — Ambulatory Visit: Payer: Medicare Other | Admitting: Physical Therapy

## 2014-12-19 NOTE — Telephone Encounter (Signed)
Patient of MMM. Last seen in office on 2-29. Rx last filled on 3-28 for #60 with 1 RF. Please advise. If approved please route to Pool A so nurse can phone in to pharmacy

## 2014-12-19 NOTE — Telephone Encounter (Signed)
RF called to pharmacy

## 2014-12-21 DIAGNOSIS — M349 Systemic sclerosis, unspecified: Secondary | ICD-10-CM | POA: Diagnosis not present

## 2014-12-21 DIAGNOSIS — R0602 Shortness of breath: Secondary | ICD-10-CM | POA: Diagnosis not present

## 2015-01-03 ENCOUNTER — Other Ambulatory Visit: Payer: Self-pay | Admitting: Nurse Practitioner

## 2015-01-03 ENCOUNTER — Ambulatory Visit: Payer: Medicare Other | Attending: Orthopedic Surgery | Admitting: Physical Therapy

## 2015-01-03 DIAGNOSIS — M25612 Stiffness of left shoulder, not elsewhere classified: Secondary | ICD-10-CM | POA: Insufficient documentation

## 2015-01-03 DIAGNOSIS — M79605 Pain in left leg: Secondary | ICD-10-CM

## 2015-01-03 DIAGNOSIS — M25512 Pain in left shoulder: Secondary | ICD-10-CM

## 2015-01-03 DIAGNOSIS — R531 Weakness: Secondary | ICD-10-CM

## 2015-01-03 DIAGNOSIS — R2681 Unsteadiness on feet: Secondary | ICD-10-CM | POA: Insufficient documentation

## 2015-01-03 NOTE — Therapy (Signed)
[facility] [place], [place], [address] Phone: [phone]   Fax:  [phone]  Physical Therapy Evaluation  Patient Details  Name: Haley Trevino MRN: [id number] Date of Birth: 1940-09-06 Referring Provider:  Justice Britain, MD  Encounter Date: 01/03/2015      PT End of Session - 01/03/15 1029    Visit Number 1   Number of Visits 8   Date for PT Re-Evaluation 01/31/15   PT Start Time 2951   PT Stop Time 1116   PT Time Calculation (min) 47 min   Activity Tolerance Patient tolerated treatment well   Behavior During Therapy [facility] for tasks assessed/performed      Past Medical History  Diagnosis Date  . Raynaud disease   . Scleroderma   . Osteoarthritis   . Dyspnea   . Thyroid disease     hypothyroidism  . Cataract     Past Surgical History  Procedure Laterality Date  . Cholecystectomy  1973  . Total abdominal hysterectomy  1974  . Breast enhancement surgery  1975  . Hand surgery  08/2012; 11/2012  . Melanoma excision      at 33 yrs of age  . Orif hip fracture Right 02/22/2014    Procedure: OPEN REDUCTION INTERNAL FIXATION RIGHT HIP;  Surgeon: Sanjuana Kava, MD;  Location: [facility];  Service: Orthopedics;  Laterality: Right;    There were no vitals filed for this visit.  Visit Diagnosis:  Left shoulder pain - Plan: PT plan of care cert/re-cert  Stiffness of left shoulder joint - Plan: PT plan of care cert/re-cert  Generalized weakness - Plan: PT plan of care cert/re-cert  Unsteadiness - Plan: PT plan of care cert/re-cert  Leg pain, left - Plan: PT plan of care cert/re-cert      Subjective Assessment - 01/03/15 1032    Subjective Patient present with c/o left shoulder pain beginning in August 2015 after being in therapy for fractured right hip. Patient has lived with it, but it has continually gotten worse. She feels it in the front through to the back of left shoulder and down arm. It is hard to turn  steering wheel with left arm. Patient also reports multiple falls in the past 6 months mainly when she is stooping down for something. Her most recent fall was yesterday when she fell forward onto her knees. She does not know why.   Limitations House hold activities;Other (comment)  unable to do housework due to Raynauds   Diagnostic tests xray   Patient Stated Goals want to be able to use arm like she used to for ADLs including doffing shirt, bra   Currently in Pain? Yes   Pain Score 7    Pain Location Shoulder   Pain Orientation Left   Pain Descriptors / Indicators Aching   Pain Radiating Towards throught shoudler and down upper arm and axilla   Pain Onset More than a month ago   Pain Frequency Constant   Aggravating Factors  driving, undressing   Pain Relieving Factors rest, vicodin   Effect of Pain on Daily Activities unable to due ADLS   Multiple Pain Sites Yes   Pain Score 3   Pain Location Leg   Pain Orientation Right   Pain Descriptors / Indicators Aching   Pain Type Chronic pain   Pain Onset More than a month ago   Pain Frequency Constant   Aggravating Factors  weightbearing, sleeping on it   Pain Relieving Factors meds,  rest   Effect of Pain on Daily Activities difficulty walking            [facility] PT Assessment - 01/03/15 0001    Assessment   Medical Diagnosis Right shoulder impingement; osteoporosis/hip fx   Onset Date/Surgical Date 02/20/14   Hand Dominance Right   Next MD Visit 01/05/15   Prior Therapy in nursing home for hip fracture   Precautions   Precautions Fall  FALL RISK   Restrictions   Weight Bearing Restrictions No   Balance Screen   Has the patient fallen in the past 6 months Yes   How many times? --  patient fall when stooping fwd, fell yesterday on knees   Has the patient had a decrease in activity level because of a fear of falling?  Yes   Is the patient reluctant to leave their home because of a fear of falling?  No   Home Environment    Living Environment --  split level house, lives with husband   Prior Function   Level of Independence Independent with basic ADLs   Observation/Other Assessments   Focus on Therapeutic Outcomes (FOTO)  57% limited   Posture/Postural Control   Posture Comments forward head, rounded shoulders   ROM / Strength   AROM / PROM / Strength AROM;Strength   AROM   Overall AROM Comments --   AROM Assessment Site Shoulder   Right/Left Shoulder Left   Left Shoulder Flexion 124 Degrees  152 pas   Left Shoulder ABduction 120 Degrees  135 pas   Left Shoulder Internal Rotation 40 Degrees  46 pas   Left Shoulder External Rotation 42 Degrees  48 pas   Strength   Overall Strength --  Left hip 4-/5; Rt 5-/5   Overall Strength Comments pain with all resisted testing   Strength Assessment Site Shoulder   Right/Left Shoulder Left   Left Shoulder Flexion 4/5   Left Shoulder Extension 4-/5   Left Shoulder ABduction 4-/5   Left Shoulder Internal Rotation 4-/5   Left Shoulder External Rotation 4-/5   Flexibility   Soft Tissue Assessment /Muscle Length --  Tight Rt ITB, bil HF   Palpation   Palpation comment TTP of left Subscapularis, lats at axilla, post RC; right ITB, piriformis   Special Tests    Special Tests --  Lt shoulder: + impingement sign; neg HK; Rt + Ober   Balance   Balance Assessed Yes   Standardized Balance Assessment   Standardized Balance Assessment Timed Up and Go Test;Five Times Sit to Stand   Five times sit to stand comments  14 seconds   Timed Up and Go Test   Normal TUG (seconds) 13                           PT Education - 01/03/15 1126    Education provided Yes   Education Details Lt shoulder ROM, corner stretch, scap retraction; Rt hip ITB stretch EOB   Person(s) Educated Patient   Methods Explanation;Demonstration;Handout   Comprehension Verbalized understanding;Returned demonstration          PT Short Term Goals - 01/03/15 1134    PT  SHORT TERM GOAL #1   Title I with initial HEP   Time 2   Period Weeks   Status New   PT SHORT TERM GOAL #2   Title Decreased pain in left shoulder by 30% (01/31/15)   Time 4   Period Weeks  Status New   PT SHORT TERM GOAL #3   Title able to undress with 50% greater ease (01/31/15)   Time 4   Period Weeks   Status New   PT SHORT TERM GOAL #4   Title no incidence of falls in 3 weeks (01/31/15)   Time 4   Period Weeks   Status New           PT Long Term Goals - Jan 04, 2015 1136    PT LONG TERM GOAL #1   Title  I with advanced HEP   Time 8   Period Weeks   Status New   PT LONG TERM GOAL #2   Title demo improved left shoulder strength to 4+/5 to improve function   Time 8   Period Weeks   Status New   PT LONG TERM GOAL #3   Title improved left shoulder motion to be able to drive and undress   Time 8   Period Weeks   Status New   PT LONG TERM GOAL #4   Title demo 4+/5 right hip strength to improve function   Time 8   Period Weeks   Status New   PT LONG TERM GOAL #5   Title improve FOTO to 40%   Time 8   Period Weeks   Status New               Plan - 01-04-2015 1127    Clinical Impression Statement Patient is a 74 year old female who is s/p Left hip fracture repair from August 2015; hip was not replaced, but has plates. During her rehab for the hip she developed left shoulder pain which has progressively worsened to where she has constant pain and cannot doff clothing or drive with LUE. She has weakness iin the left shoulder and pain with all muscle testing. Her ROM is decreased also. Patient also reports multiple falls in the past six months. She has left hip pain, weakness, and tightness. She amb without AD. Patient will benefit from  PT to increase strength and ROM, decreasee pain and improve balance to increase function.   Pt will benefit from skilled therapeutic intervention in order to improve on the following deficits Decreased range of motion;Difficulty  walking;Impaired UE functional use;Pain;Decreased balance;Impaired flexibility;Decreased strength;Postural dysfunction   Rehab Potential Good   PT Frequency 2x / week   PT Duration 8 weeks   PT Treatment/Interventions ADLs/Self Care Home Management;Cryotherapy;Electrical Stimulation;Iontophoresis 4mg /ml Dexamethasone;Moist Heat;Ultrasound;Therapeutic exercise;Balance training;Neuromuscular re-education;Patient/family education;Passive range of motion;Manual techniques;Taping;Vasopneumatic Device   PT Next Visit Plan BERG Balance assessment; shoulder isometrics for HEP, scap stab, modalities for shoulder pain; stw Right ITB   Consulted and Agree with Plan of Care Patient          G-Codes - Jan 04, 2015 1142    Functional Assessment Tool Used FOTO   Functional Limitation Other PT primary   Other PT Primary Current Status (L9379) At least 40 percent but less than 60 percent impaired, limited or restricted   Other PT Primary Goal Status (K2409) At least 40 percent but less than 60 percent impaired, limited or restricted       Problem List Patient Active Problem List   Diagnosis Date Noted  . Impingement syndrome of left shoulder region 12/08/2014  . Osteoporosis 09/20/2014  . Hip fracture requiring operative repair 02/22/2014  . Hypothyroidism 11/29/2013  . Depression 11/29/2013  . GERD (gastroesophageal reflux disease) 11/29/2013  . Hypertension 11/29/2013  . Hyperlipidemia with target LDL less than  100 11/29/2013  . Thyroid cyst 04/26/2013  . MALIGNANT MELANOMA, SKIN 08/07/2010  . DYSPNEA 08/07/2010  . [name] 08/06/2010  . SCLERODERMA 08/06/2010  . OSTEOARTHRITIS 08/06/2010   Madelyn Flavors PT  01/03/2015, 11:48 AM  [facility] [address] [place], [place], [address] Phone: [phone]   Fax:  [phone]

## 2015-01-03 NOTE — Patient Instructions (Signed)
Iliotibial Band Stretch  Iliotibial Band Stretch, Side-Lying   Lie on side, back to edge of bed, top arm in front. Allow top leg to drape behind over edge. Hold _30__ seconds.  Repeat _3__ times per session. Do _2__ sessions per day.   Scapular Retraction (Standing)   With arms at sides, pinch shoulder blades together. Repeat 10____ times per set. Do ____ sets per session. Do _2___ sessions per day.  http://orth.exer.us/944  Flexibility: Corner Stretch   Standing in corner with hands just above shoulder level or stand in a doorway. Lean forward until a comfortable stretch is felt across chest. Hold __30__ seconds. Repeat __3__ times per set. Do ____ sets per session. Do _2___ sessions per day.  http://orth.exer.us/342   Copyright  VHI. All rights reserved.   SHOULDER RANGE OF MOTION: STAND FACING WALL AND SLIDE LEFT ARM UP TO PAIN. THEN DOWN. REPEAT 5 TIMES, ONCE PER DAY. STAND SIDEWAYS TO WALL AND SLIDE ARM UP TO PAIN (LIKE JUMPING JACK). REPEAT 5 TIMES, ONCE PER DAY.  Madelyn Flavors, PT 01/03/2015 11:14 AM [facility] [address] [place], [place], [address] Phone: [phone]   Fax:  [phone]

## 2015-01-05 ENCOUNTER — Ambulatory Visit: Payer: Medicare Other | Admitting: Nurse Practitioner

## 2015-01-09 ENCOUNTER — Encounter: Payer: Self-pay | Admitting: Physical Therapy

## 2015-01-09 ENCOUNTER — Ambulatory Visit: Payer: Medicare Other | Attending: Orthopedic Surgery | Admitting: Physical Therapy

## 2015-01-09 DIAGNOSIS — R2681 Unsteadiness on feet: Secondary | ICD-10-CM | POA: Insufficient documentation

## 2015-01-09 DIAGNOSIS — M25512 Pain in left shoulder: Secondary | ICD-10-CM | POA: Insufficient documentation

## 2015-01-09 DIAGNOSIS — R531 Weakness: Secondary | ICD-10-CM | POA: Diagnosis not present

## 2015-01-09 DIAGNOSIS — M79605 Pain in left leg: Secondary | ICD-10-CM

## 2015-01-09 DIAGNOSIS — M25612 Stiffness of left shoulder, not elsewhere classified: Secondary | ICD-10-CM | POA: Diagnosis not present

## 2015-01-09 NOTE — Therapy (Signed)
[place], [place], [address] Phone: [phone]   Fax:  [phone]  Physical Therapy Treatment  Patient Details  Name: Haley Trevino MRN: [id number] Date of Birth: 12/05/1940 Referring Provider:  [name], *  Encounter Date: 01/09/2015      PT End of Session - 01/09/15 1147    Visit Number 2   Number of Visits 8   Date for PT Re-Evaluation 01/31/15   PT Start Time 1115   PT Stop Time 1203   PT Time Calculation (min) 48 min   Activity Tolerance Patient tolerated treatment well   Behavior During Therapy [facility] for tasks assessed/performed      Past Medical History  Diagnosis Date  . Raynaud disease   . Scleroderma   . Osteoarthritis   . Dyspnea   . Thyroid disease     hypothyroidism  . Cataract     Past Surgical History  Procedure Laterality Date  . Cholecystectomy  1973  . Total abdominal hysterectomy  1974  . Breast enhancement surgery  1975  . Hand surgery  08/2012; 11/2012  . Melanoma excision      at 54 yrs of age  . Orif hip fracture Right 02/22/2014    Procedure: OPEN REDUCTION INTERNAL FIXATION RIGHT HIP;  Surgeon: Sanjuana Kava, MD;  Location: [facility];  Service: Orthopedics;  Laterality: Right;    There were no vitals filed for this visit.  Visit Diagnosis:  Left shoulder pain  Stiffness of left shoulder joint  Generalized weakness  Unsteadiness  Leg pain, left      Subjective Assessment - 01/09/15 1122    Subjective Reports soreness in shoulder following evalation. Had 2 falls last week (Monday and Thursday). Reports soreness in the L knee which is the knee she fell on Thursday evening. Reports completing posture exericses this weekend from evaluation with PT but no shoulder exercises due to soreness.   Limitations House hold activities;Other (comment)   Diagnostic tests xray   Patient Stated Goals want to be able to use arm like she used to for ADLs including  doffing shirt, bra   Currently in Pain? Yes   Pain Score 3    Pain Location Shoulder   Pain Orientation Left   Pain Descriptors / Indicators Sore   Pain Onset More than a month ago            [facility] PT Assessment - 01/09/15 0001    Assessment   Medical Diagnosis Right shoulder impingement; osteoporosis/hip fx   Onset Date/Surgical Date 02/20/14   Hand Dominance Right   Next MD Visit 01/05/15   Prior Therapy in nursing home for hip fracture                     [facility] Adult PT Treatment/Exercise - 01/09/15 0001    Balance   Balance Assessed Yes   Standardized Balance Assessment   Standardized Balance Assessment Berg Balance Test   Berg Balance Test   Sit to Stand Able to stand without using hands and stabilize independently   Standing Unsupported Able to stand safely 2 minutes   Sitting with Back Unsupported but Feet Supported on Floor or Stool Able to sit safely and securely 2 minutes   Stand to Sit Sits safely with minimal use of hands   Transfers Able to transfer safely, minor use of hands   Standing Unsupported with Eyes Closed Able to stand 10 seconds safely   Standing Ubsupported  with Feet Together Able to place feet together independently and stand 1 minute safely   From Standing, Reach Forward with Outstretched Arm Can reach confidently >25 cm (10")   From Standing Position, Pick up Object from [place] to pick up shoe safely and easily   From Standing Position, Turn to Look Behind Over each Shoulder Looks behind one side only/other side shows less weight shift   Turn 360 Degrees Able to turn 360 degrees safely in 4 seconds or less   Standing Unsupported, Alternately Place Feet on Step/Stool Able to stand independently and complete 8 steps >20 seconds   Standing Unsupported, One Foot in Front Able to place foot tandem independently and hold 30 seconds   Standing on One Leg Able to lift leg independently and hold 5-10 seconds   Total Score 53   Exercises    Exercises Shoulder;Knee/Hip   Knee/Hip Exercises: Aerobic   Nustep L3 x11 min   Shoulder Exercises: Standing   Extension Strengthening;Left;20 reps;Theraband   Theraband Level (Shoulder Extension) Level 2 (Red)   Row Strengthening;Left;20 reps;Theraband   Theraband Level (Shoulder Row) Level 2 (Red)   Other Standing Exercises R shoulder flex/ext/ER/IR isometrics 10 sec hold x20 reps each   Shoulder Exercises: Pulleys   Flexion Other (comment)  x4 min   Other Pulley Exercises --                PT Education - 01/09/15 1147    Education provided Yes   Education Details HEP- shoulder isometrics into flex/ext/ER/IR   Person(s) Educated Patient   Methods Explanation;Demonstration;Verbal cues;Handout   Comprehension Verbalized understanding;Returned demonstration;Verbal cues required          PT Short Term Goals - 01/03/15 1134    PT SHORT TERM GOAL #1   Title I with initial HEP   Time 2   Period Weeks   Status New   PT SHORT TERM GOAL #2   Title Decreased pain in left shoulder by 30% (01/31/15)   Time 4   Period Weeks   Status New   PT SHORT TERM GOAL #3   Title able to undress with 50% greater ease (01/31/15)   Time 4   Period Weeks   Status New   PT SHORT TERM GOAL #4   Title no incidence of falls in 3 weeks (01/31/15)   Time 4   Period Weeks   Status New           PT Long Term Goals - 01/03/15 1136    PT LONG TERM GOAL #1   Title  I with advanced HEP   Time 8   Period Weeks   Status New   PT LONG TERM GOAL #2   Title demo improved left shoulder strength to 4+/5 to improve function   Time 8   Period Weeks   Status New   PT LONG TERM GOAL #3   Title improved left shoulder motion to be able to drive and undress   Time 8   Period Weeks   Status New   PT LONG TERM GOAL #4   Title demo 4+/5 right hip strength to improve function   Time 8   Period Weeks   Status New   PT LONG TERM GOAL #5   Title improve FOTO to 40%   Time 8   Period Weeks    Status New               Plan - 01/09/15 1206  Clinical Impression Statement Patient tolerated treatment well today wihtout complaints of pain during treatment. Demonstrated rounded shoulders during treatment today. Required moderate verbal cueing and demonstration for technique of extension and rows as well as shoulder isometrics. Scored 53 on BERG balance test as directed by PT. Experienced 4/10 noticable and tolerable pain in R thigh following treatment.   Pt will benefit from skilled therapeutic intervention in order to improve on the following deficits Decreased range of motion;Difficulty walking;Impaired UE functional use;Pain;Decreased balance;Impaired flexibility;Decreased strength;Postural dysfunction   Rehab Potential Good   PT Frequency 2x / week   PT Duration 8 weeks   PT Treatment/Interventions ADLs/Self Care Home Management;Cryotherapy;Electrical Stimulation;Iontophoresis 4mg /ml Dexamethasone;Moist Heat;Ultrasound;Therapeutic exercise;Balance training;Neuromuscular re-education;Patient/family education;Passive range of motion;Manual techniques;Taping;Vasopneumatic Device   PT Next Visit Plan Continue per PT POC regarding shoulder, balance   Consulted and Agree with Plan of Care Patient        Problem List Patient Active Problem List   Diagnosis Date Noted  . Impingement syndrome of left shoulder region 12/08/2014  . Osteoporosis 09/20/2014  . Hip fracture requiring operative repair 02/22/2014  . Hypothyroidism 11/29/2013  . Depression 11/29/2013  . GERD (gastroesophageal reflux disease) 11/29/2013  . Hypertension 11/29/2013  . Hyperlipidemia with target LDL less than 100 11/29/2013  . Thyroid cyst 04/26/2013  . MALIGNANT MELANOMA, SKIN 08/07/2010  . DYSPNEA 08/07/2010  . [name] 08/06/2010  . SCLERODERMA 08/06/2010  . OSTEOARTHRITIS 08/06/2010    Wynelle Fanny, PTA 01/09/2015, 12:11 PM  [facility] [address] [place], [place], [address] Phone: [phone]   Fax:  [phone]

## 2015-01-09 NOTE — Patient Instructions (Signed)
Strengthening: Isometric Flexion   Using wall for resistance, press right fist into ball using light pressure. Hold __10__ seconds. Repeat _10___ times per set. Do __2__ sets per session. Do _2-3___ sessions per day.  http://orth.exer.us/800   Copyright  VHI. All rights reserved.  Strengthening: Isometric Extension   Using wall for resistance, press back of left arm into ball using light pressure. Hold __10__ seconds. Repeat __10__ times per set. Do __2__ sets per session. Do _2-3___ sessions per day.  http://orth.exer.us/804   Copyright  VHI. All rights reserved.  Strengthening: Isometric External Rotation   Using wall to provide resistance, and keeping right arm at side, press back of hand into ball using light pressure. Hold __10__ seconds. Repeat _10___ times per set. Do __2__ sets per session. Do _2-3___ sessions per day.  http://orth.exer.us/814   Copyright  VHI. All rights reserved.  Strengthening: Isometric Internal Rotation   Using door frame for resistance, press palm of right hand into ball using light pressure. Keep elbow in at side. Hold __10__ seconds. Repeat _10___ times per set. Do _2___ sets per session. Do _2-3___ sessions per day.  http://orth.exer.us/816   Copyright  VHI. All rights reserved.

## 2015-01-15 ENCOUNTER — Encounter: Payer: Self-pay | Admitting: Nurse Practitioner

## 2015-01-15 ENCOUNTER — Ambulatory Visit (INDEPENDENT_AMBULATORY_CARE_PROVIDER_SITE_OTHER): Payer: Medicare Other | Admitting: Nurse Practitioner

## 2015-01-15 VITALS — BP 138/70 | HR 80 | Temp 98.3°F | Ht 61.0 in | Wt 123.0 lb

## 2015-01-15 DIAGNOSIS — M349 Systemic sclerosis, unspecified: Secondary | ICD-10-CM | POA: Diagnosis not present

## 2015-01-15 DIAGNOSIS — F32A Depression, unspecified: Secondary | ICD-10-CM

## 2015-01-15 DIAGNOSIS — E039 Hypothyroidism, unspecified: Secondary | ICD-10-CM | POA: Diagnosis not present

## 2015-01-15 DIAGNOSIS — E785 Hyperlipidemia, unspecified: Secondary | ICD-10-CM | POA: Diagnosis not present

## 2015-01-15 DIAGNOSIS — M810 Age-related osteoporosis without current pathological fracture: Secondary | ICD-10-CM

## 2015-01-15 DIAGNOSIS — I10 Essential (primary) hypertension: Secondary | ICD-10-CM | POA: Diagnosis not present

## 2015-01-15 DIAGNOSIS — K219 Gastro-esophageal reflux disease without esophagitis: Secondary | ICD-10-CM | POA: Diagnosis not present

## 2015-01-15 DIAGNOSIS — I7300 Raynaud's syndrome without gangrene: Secondary | ICD-10-CM

## 2015-01-15 DIAGNOSIS — F329 Major depressive disorder, single episode, unspecified: Secondary | ICD-10-CM | POA: Diagnosis not present

## 2015-01-15 MED ORDER — OMEPRAZOLE 20 MG PO CPDR: 20.0000 mg | DELAYED_RELEASE_CAPSULE | Freq: Every day | ORAL | Status: DC

## 2015-01-15 NOTE — Progress Notes (Signed)
Subjective:    Patient ID: Haley Trevino, female    DOB: [birth year], 74 y.o.   MRN: [id number]  HPI  Patient here today for follow up of chronic medical problems.     Sclerderma Getting worse- is being referred to [facility]- starting to get ulcers on fingers that get infected. Currently on doxy for finger infections- doesn't know how long she will be taking this. Having trouble walking due to so much pain. Raynauds Still has bad nerve pain and hurts to just touch anything. Has started on lyrica to help with nerve pain- could not tolerate gabapentin because it caused swelling. Has minimal swelling for lyrica but not like she had with gabapentin Hypothyroidism SYnthroid 69mg and doing well with that. Has fatigue but she thinks that is from everything nit just thyroid problems. Depression Decided to take a break from it because she has been on it so long- Sees psychiatrist next tomorrow and will probably start back on  It tomorrow. Hyperlipidemia-  Welchol is the only thing she can tolerate- statins make her hurt Hypertension On procardia which helps greatly with blood pressure and raynauds. Gerd Omeprazole works well to keep symptoms under control. Right hip fracture August 2015- has had PT but still has pain and is limping. Very unsteady and has fallen 2 x in the last several months    Review of Systems  Constitutional: Negative.   HENT: Negative.   Respiratory: Negative.   Cardiovascular: Negative.   Gastrointestinal: Negative.   Genitourinary: Negative.   Neurological: Negative.   Psychiatric/Behavioral: Negative.   All other systems reviewed and are negative.      Objective:   Physical Exam  Constitutional: She is oriented to person, place, and time. She appears well-developed and well-nourished.  HENT:  Nose: Nose normal.  Mouth/Throat: Oropharynx is clear and moist.  Eyes: EOM are normal.  Neck: Trachea normal, normal range of motion and full passive range of  motion without pain. Neck supple. No JVD present. Carotid bruit is not present. No thyromegaly present.  Cardiovascular: Normal rate, regular rhythm, normal heart sounds and intact distal pulses.  Exam reveals no gallop and no friction rub.   No murmur heard. Tips of fingers are cold to touch with slow cap refill.  Pulmonary/Chest: Effort normal and breath sounds normal.  Abdominal: Soft. Bowel sounds are normal. She exhibits no distension and no mass. There is no tenderness.  Musculoskeletal: Normal range of motion.  limps with gait but improving  Lymphadenopathy:    She has no cervical adenopathy.  Neurological: She is alert and oriented to person, place, and time. She has normal reflexes.  Skin: Skin is warm and dry.  Psychiatric: She has a normal mood and affect. Her behavior is normal. Judgment and thought content normal.    BP 138/70 mmHg  Pulse 80  Temp(Src) 98.3 F (36.8 C) (Oral)  Ht '5\' 1"'  (1.549 m)  Wt 123 lb (55.792 kg)  BMI 23.25 kg/m2      Assessment & Plan:   1. Gastroesophageal reflux disease without esophagitis Avoid spicy foods Do not eat 2 hours prior to bedtime - omeprazole (PRILOSEC) 20 MG capsule; Take 1 capsule (20 mg total) by mouth daily.  Dispense: 90 capsule; Refill: 1  2. [name] Continue follow up appointments with rheumatologist  3. Essential hypertension Do not ad salt in diet - CMP14+EGFR  4. Hypothyroidism, unspecified hypothyroidism type - Thyroid Panel With TSH  5. Osteoporosis Weight bearing exercises when able  6.  SCLERODERMA Keep follow up appointment with rheumatologist  7. Hyperlipidemia with target LDL less than 100 Low fat diet - Lipid panel  8. Depression Stress management    Labs pending Health maintenance reviewed Diet and exercise encouraged Continue all meds Follow up  In 3 month   [place], FNP

## 2015-01-15 NOTE — Patient Instructions (Signed)

## 2015-01-16 ENCOUNTER — Ambulatory Visit: Payer: Medicare Other | Admitting: Physical Therapy

## 2015-01-16 ENCOUNTER — Encounter: Payer: Self-pay | Admitting: Physical Therapy

## 2015-01-16 DIAGNOSIS — R531 Weakness: Secondary | ICD-10-CM | POA: Diagnosis not present

## 2015-01-16 DIAGNOSIS — R2681 Unsteadiness on feet: Secondary | ICD-10-CM | POA: Diagnosis not present

## 2015-01-16 DIAGNOSIS — M25612 Stiffness of left shoulder, not elsewhere classified: Secondary | ICD-10-CM | POA: Diagnosis not present

## 2015-01-16 DIAGNOSIS — M79605 Pain in left leg: Secondary | ICD-10-CM

## 2015-01-16 DIAGNOSIS — M25512 Pain in left shoulder: Secondary | ICD-10-CM

## 2015-01-16 LAB — LIPID PANEL
Chol/HDL Ratio: 4.6 ratio units — ABNORMAL HIGH (ref 0.0–4.4)
Cholesterol, Total: 229 mg/dL — ABNORMAL HIGH (ref 100–199)
HDL: 50 mg/dL (ref >39)
LDL CALC: 155 mg/dL — AB (ref 0–99)
Triglycerides: 121 mg/dL (ref 0–149)
VLDL CHOLESTEROL CAL: 24 mg/dL (ref 5–40)

## 2015-01-16 LAB — CMP14+EGFR
ALK PHOS: 60 IU/L (ref 39–117)
ALT: 7 IU/L (ref 0–32)
AST: 9 IU/L (ref 0–40)
Albumin/Globulin Ratio: 1.8 (ref 1.1–2.5)
Albumin: 4.3 g/dL (ref 3.5–4.8)
BUN/Creatinine Ratio: 13 (ref 11–26)
BUN: 11 mg/dL (ref 8–27)
Bilirubin Total: <0.2 mg/dL (ref 0.0–1.2)
CALCIUM: 9.4 mg/dL (ref 8.7–10.3)
CO2: 23 mmol/L (ref 18–29)
Chloride: 103 mmol/L (ref 97–108)
Creatinine, Ser: 0.83 mg/dL (ref 0.57–1.00)
GFR calc Af Amer: 81 mL/min/{1.73_m2} (ref >59)
GFR, EST NON AFRICAN AMERICAN: 70 mL/min/{1.73_m2} (ref >59)
GLOBULIN, TOTAL: 2.4 g/dL (ref 1.5–4.5)
GLUCOSE: 87 mg/dL (ref 65–99)
Potassium: 4.6 mmol/L (ref 3.5–5.2)
Sodium: 142 mmol/L (ref 134–144)
Total Protein: 6.7 g/dL (ref 6.0–8.5)

## 2015-01-16 LAB — THYROID PANEL WITH TSH
Free Thyroxine Index: 2.2 (ref 1.2–4.9)
T3 UPTAKE RATIO: 26 % (ref 24–39)
T4, Total: 8.6 ug/dL (ref 4.5–12.0)
TSH: 2.89 u[IU]/mL (ref 0.450–4.500)

## 2015-01-16 NOTE — Therapy (Signed)
[facility] [place], [place], [address] Phone: [phone]   Fax:  [phone]  Physical Therapy Treatment  Patient Details  Name: Haley Trevino MRN: [id number] Date of Birth: 21-Dec-1940 Referring Provider:  [name], *  Encounter Date: 01/16/2015      PT End of Session - 01/16/15 1123    Visit Number 3   Number of Visits 8   Date for PT Re-Evaluation 01/31/15   PT Start Time 1117   PT Stop Time 1205   PT Time Calculation (min) 48 min   Activity Tolerance Patient tolerated treatment well   Behavior During Therapy [facility] for tasks assessed/performed      Past Medical History  Diagnosis Date  . Raynaud disease   . Scleroderma   . Osteoarthritis   . Dyspnea   . Thyroid disease     hypothyroidism  . Cataract     Past Surgical History  Procedure Laterality Date  . Cholecystectomy  1973  . Total abdominal hysterectomy  1974  . Breast enhancement surgery  1975  . Hand surgery  08/2012; 11/2012  . Melanoma excision      at 37 yrs of age  . Orif hip fracture Right 02/22/2014    Procedure: OPEN REDUCTION INTERNAL FIXATION RIGHT HIP;  Surgeon: Sanjuana Kava, MD;  Location: [facility];  Service: Orthopedics;  Laterality: Right;    There were no vitals filed for this visit.  Visit Diagnosis:  Left shoulder pain  Stiffness of left shoulder joint  Generalized weakness  Unsteadiness  Leg pain, left      Subjective Assessment - 01/16/15 1122    Subjective Reports that she went grocery shopping at [facility] yesterday and her shoulder is sore today. Does not trust herself driving far distances and if she has to go to [place] or [place] her husband drives her. Reports that her hands have been causing problems for her recently and is hard for her to complete isometrics exercises with fists made secondary to the pain from her Raynauds.   Limitations House hold activities;Other (comment)   Diagnostic tests  xray   Patient Stated Goals want to be able to use arm like she used to for ADLs including doffing shirt, bra   Currently in Pain? Yes   Pain Score 3    Pain Location Shoulder   Pain Orientation Left   Pain Descriptors / Indicators Sore   Pain Onset More than a month ago            [facility] PT Assessment - 01/16/15 0001    Assessment   Medical Diagnosis Right shoulder impingement; osteoporosis/hip fx   Onset Date/Surgical Date 02/20/14   Hand Dominance Right   Next MD Visit 01/05/15   Prior Therapy in nursing home for hip fracture                     [facility] Adult PT Treatment/Exercise - 01/16/15 0001    Knee/Hip Exercises: Aerobic   Nustep L5 x10 min   Knee/Hip Exercises: Standing   Heel Raises Both;3 sets;10 reps   Lateral Step Up Both;2 sets;10 reps;Hand Hold: 2;Step Height: 6"   Forward Step Up Both;2 sets;10 reps;Hand Hold: 2;Step Height: 6"   Other Standing Knee Exercises B toe raises 3 x10 reps   Knee/Hip Exercises: Seated   Clamshell with TheraBand Red  1 x10 reps   Shoulder Exercises: Standing   Extension Strengthening;Left;20 reps;Theraband   Theraband Level (Shoulder Extension) Level  2 (Red)   Other Standing Exercises R shoulder flex/ext/ER/IR isometrics 10 sec hold x10 reps each with palm open   Shoulder Exercises: Pulleys   Flexion 3 minutes             Balance Exercises - 01/16/15 1208    Balance Exercises: Standing   Rockerboard Lateral;Intermittent UE support  x4 min with weightshifting to improve balance   Other Standing Exercises DLS on inverted BOSU x3 min with intermittant UE use; DLS reaching across midline on airex x2 min           PT Education - 01/16/15 1206    Education provided Yes   Education Details HEP- heel raises, toe raises, sitting clamshell; correction with  clamshells for 10 reps 2-3 sets   Person(s) Educated Patient   Methods Explanation;Demonstration;Verbal cues;Tactile cues;Handout   Comprehension Verbalized  understanding;Returned demonstration;Verbal cues required;Tactile cues required          PT Short Term Goals - 01/16/15 1219    PT SHORT TERM GOAL #1   Title I with initial HEP   Time 2   Period Weeks   Status On-going   PT SHORT TERM GOAL #2   Title Decreased pain in left shoulder by 30% (01/31/15)   Time 4   Period Weeks   Status On-going   PT SHORT TERM GOAL #3   Title able to undress with 50% greater ease (01/31/15)   Time 4   Period Weeks   Status On-going   PT SHORT TERM GOAL #4   Title no incidence of falls in 3 weeks (01/31/15)   Time 4   Period Weeks   Status On-going           PT Long Term Goals - 01/16/15 1219    PT LONG TERM GOAL #1   Title  I with advanced HEP   Time 8   Period Weeks   Status On-going   PT LONG TERM GOAL #2   Title demo improved left shoulder strength to 4+/5 to improve function   Time 8   Period Weeks   Status On-going   PT LONG TERM GOAL #3   Title improved left shoulder motion to be able to drive and undress   Time 8   Period Weeks   Status On-going   PT LONG TERM GOAL #4   Title demo 4+/5 right hip strength to improve function   Time 8   Period Weeks   Status On-going   PT LONG TERM GOAL #5   Title improve FOTO to 40%   Time 8   Period Weeks   Status On-going               Plan - 01/16/15 1213    Clinical Impression Statement Patient tolerated treatment well today without complaints of increased pain. Instructed patient in completing isometrics of the shoulder with palm out to be able to complete without hand pain due to Raynauds. Requires minimal to moderate verbal cueing and demonstration for exercise techniques or to correct form. Accpeted new HEP for home for LE strengthening with questions answered as patient needed. No goals met today in clinic secondary to decreased shoulder/ hip strength, increased pain, ADL difficulty. Denied hip pain following treatment but stated that her shoulder pain is always present  but did not give numerical rating.   Pt will benefit from skilled therapeutic intervention in order to improve on the following deficits Decreased range of motion;Difficulty walking;Impaired UE functional use;Pain;Decreased balance;Impaired  flexibility;Decreased strength;Postural dysfunction   Rehab Potential Good   PT Frequency 2x / week   PT Duration 8 weeks   PT Treatment/Interventions ADLs/Self Care Home Management;Cryotherapy;Electrical Stimulation;Iontophoresis 31m/ml Dexamethasone;Moist Heat;Ultrasound;Therapeutic exercise;Balance training;Neuromuscular re-education;Patient/family education;Passive range of motion;Manual techniques;Taping;Vasopneumatic Device   PT Next Visit Plan Continue per PT POC regarding shoulder, balance   Consulted and Agree with Plan of Care Patient        Problem List Patient Active Problem List   Diagnosis Date Noted  . Impingement syndrome of left shoulder region 12/08/2014  . Osteoporosis 09/20/2014  . Hip fracture requiring operative repair 02/22/2014  . Hypothyroidism 11/29/2013  . Depression 11/29/2013  . GERD (gastroesophageal reflux disease) 11/29/2013  . Hypertension 11/29/2013  . Hyperlipidemia with target LDL less than 100 11/29/2013  . Thyroid cyst 04/26/2013  . MALIGNANT MELANOMA, SKIN 08/07/2010  . DYSPNEA 08/07/2010  . [name] 08/06/2010  . SCLERODERMA 08/06/2010  . OSTEOARTHRITIS 08/06/2010    KWynelle Fanny PTA 01/16/2015, 12:23 PM  [facility] [address] [address]Phone: [phone]  Fax:  [phone]

## 2015-01-16 NOTE — Patient Instructions (Signed)
Heel Raise: Bilateral (Standing)   Rise on balls of feet. Repeat __10__ times per set. Do _3___ sets per session. Do __2-3__ sessions per day.  http://orth.exer.us/38   Copyright  VHI. All rights reserved.  Toe Raise (Sitting)   Raise toes, keeping heels on floor. Repeat _10___ times per set. Do __3__ sets per session. Do _2-3___ sessions per day.  http://orth.exer.us/46   Copyright  VHI. All rights reserved.  Strengthening: Hip Abductor - Resisted   With band looped around both legs above knees, push thighs apart. Repeat _102___ times per set. Do 2-3____ sets per session. Do _2-3___ sessions per day.  http://orth.exer.us/688   Copyright  VHI. All rights reserved.

## 2015-01-18 ENCOUNTER — Encounter: Payer: Self-pay | Admitting: Physical Therapy

## 2015-01-18 ENCOUNTER — Ambulatory Visit: Payer: Medicare Other | Admitting: Physical Therapy

## 2015-01-18 DIAGNOSIS — R2681 Unsteadiness on feet: Secondary | ICD-10-CM

## 2015-01-18 DIAGNOSIS — M25512 Pain in left shoulder: Secondary | ICD-10-CM

## 2015-01-18 DIAGNOSIS — R531 Weakness: Secondary | ICD-10-CM

## 2015-01-18 DIAGNOSIS — M79605 Pain in left leg: Secondary | ICD-10-CM

## 2015-01-18 DIAGNOSIS — M25612 Stiffness of left shoulder, not elsewhere classified: Secondary | ICD-10-CM | POA: Diagnosis not present

## 2015-01-18 NOTE — Therapy (Signed)
[facility] [place], [place], [address] Phone: [phone]   Fax:  [phone]  Physical Therapy Treatment  Patient Details  Name: Haley Trevino MRN: [id number] Date of Birth: 1941-06-01 Referring Provider:  [name], *  Encounter Date: 01/18/2015      PT End of Session - 01/18/15 1120    Visit Number 4   Number of Visits 8   Date for PT Re-Evaluation 01/31/15   PT Start Time 1118   PT Stop Time 1200   PT Time Calculation (min) 42 min   Activity Tolerance Patient tolerated treatment well   Behavior During Therapy [facility] for tasks assessed/performed      Past Medical History  Diagnosis Date  . Raynaud disease   . Scleroderma   . Osteoarthritis   . Dyspnea   . Thyroid disease     hypothyroidism  . Cataract     Past Surgical History  Procedure Laterality Date  . Cholecystectomy  1973  . Total abdominal hysterectomy  1974  . Breast enhancement surgery  1975  . Hand surgery  08/2012; 11/2012  . Melanoma excision      at 37 yrs of age  . Orif hip fracture Right 02/22/2014    Procedure: OPEN REDUCTION INTERNAL FIXATION RIGHT HIP;  Surgeon: Sanjuana Kava, MD;  Location: [facility];  Service: Orthopedics;  Laterality: Right;    There were no vitals filed for this visit.  Visit Diagnosis:  Left shoulder pain  Stiffness of left shoulder joint  Generalized weakness  Unsteadiness  Leg pain, left      Subjective Assessment - 01/18/15 1119    Subjective States that she is a little sore from previous treatment in her legs and her shoulder hurt her bad yesterday.   Limitations House hold activities;Other (comment)   Diagnostic tests xray   Patient Stated Goals want to be able to use arm like she used to for ADLs including doffing shirt, bra   Currently in Pain? Yes   Pain Score 5    Pain Location Shoulder   Pain Orientation Left   Pain Descriptors / Indicators Sore   Pain Onset More than a month  ago            [facility] PT Assessment - 01/18/15 0001    Assessment   Medical Diagnosis Right shoulder impingement; osteoporosis/hip fx   Onset Date/Surgical Date 02/20/14   Hand Dominance Right   Next MD Visit 01/05/15   Prior Therapy in nursing home for hip fracture                     [facility] Adult PT Treatment/Exercise - 01/18/15 0001    Knee/Hip Exercises: Aerobic   Nustep L5 x10 min   Knee/Hip Exercises: Standing   Heel Raises Both;3 sets;10 reps   Hip Abduction Stengthening;Both;3 sets;10 reps;Knee straight   Lateral Step Up Both;2 sets;10 reps;Hand Hold: 2;Step Height: 6"   Forward Step Up Both;2 sets;10 reps;Hand Hold: 2;Step Height: 6"   Other Standing Knee Exercises B toe raises 3 x10 reps   Knee/Hip Exercises: Supine   Straight Leg Raises Strengthening;Right;3 sets;10 reps   Straight Leg Raise with External Rotation Strengthening;Right;1 set;10 reps   Shoulder Exercises: Standing   Extension Strengthening;Left;20 reps;Theraband   Theraband Level (Shoulder Extension) Level 2 (Red)   Row Strengthening;Left;20 reps;Theraband   Theraband Level (Shoulder Row) Level 2 (Red)   Other Standing Exercises R shoulder flex/ext/ER/IR isometrics 10 sec hold x10  reps each with palm open   Shoulder Exercises: Pulleys   Flexion 3 minutes                  PT Short Term Goals - 01/18/15 1139    PT SHORT TERM GOAL #1   Title I with initial HEP   Time 2   Period Weeks   Status Achieved   PT SHORT TERM GOAL #2   Title Decreased pain in left shoulder by 30% (01/31/15)   Time 4   Period Weeks   Status On-going   PT SHORT TERM GOAL #3   Title able to undress with 50% greater ease (01/31/15)   Time 4   Period Weeks   Status On-going   PT SHORT TERM GOAL #4   Title no incidence of falls in 3 weeks (01/31/15)   Time 4   Period Weeks   Status On-going           PT Long Term Goals - 01/16/15 1219    PT LONG TERM GOAL #1   Title  I with advanced HEP    Time 8   Period Weeks   Status On-going   PT LONG TERM GOAL #2   Title demo improved left shoulder strength to 4+/5 to improve function   Time 8   Period Weeks   Status On-going   PT LONG TERM GOAL #3   Title improved left shoulder motion to be able to drive and undress   Time 8   Period Weeks   Status On-going   PT LONG TERM GOAL #4   Title demo 4+/5 right hip strength to improve function   Time 8   Period Weeks   Status On-going   PT LONG TERM GOAL #5   Title improve FOTO to 40%   Time 8   Period Weeks   Status On-going               Plan - 01/18/15 1204    Clinical Impression Statement Patient tolerated treatment well today without complaints because of the exercises that were directed. Continued shoulder isometrics with palm open to decrease R hand pain due to Raynauds. Continues to require minimal to moderate verbal cueing and demonstration for exericse technique and form. Expressed that she sometimes has dull pain in the R groin area following the hip replacement which she was told by surgeon that she had arthritis thus completing SLRs. Achieved ST HEP goal today. Denied pain, soreness in L shoulder or R hip following treatment.   Pt will benefit from skilled therapeutic intervention in order to improve on the following deficits Decreased range of motion;Difficulty walking;Impaired UE functional use;Pain;Decreased balance;Impaired flexibility;Decreased strength;Postural dysfunction   Rehab Potential Good   PT Frequency 2x / week   PT Duration 8 weeks   PT Treatment/Interventions ADLs/Self Care Home Management;Cryotherapy;Electrical Stimulation;Iontophoresis 4mg /ml Dexamethasone;Moist Heat;Ultrasound;Therapeutic exercise;Balance training;Neuromuscular re-education;Patient/family education;Passive range of motion;Manual techniques;Taping;Vasopneumatic Device   PT Next Visit Plan Continue per PT POC regarding shoulder, balance   Consulted and Agree with Plan of Care  Patient        Problem List Patient Active Problem List   Diagnosis Date Noted  . Impingement syndrome of left shoulder region 12/08/2014  . Osteoporosis 09/20/2014  . Hip fracture requiring operative repair 02/22/2014  . Hypothyroidism 11/29/2013  . Depression 11/29/2013  . GERD (gastroesophageal reflux disease) 11/29/2013  . Hypertension 11/29/2013  . Hyperlipidemia with target LDL less than 100 11/29/2013  . Thyroid cyst 04/26/2013  .  MALIGNANT MELANOMA, SKIN 08/07/2010  . DYSPNEA 08/07/2010  . [name] 08/06/2010  . SCLERODERMA 08/06/2010  . OSTEOARTHRITIS 08/06/2010    Wynelle Fanny, PTA 01/18/2015, 12:08 PM  [facility] [address] [place], [place], [address] Phone: [phone]   Fax:  [phone]

## 2015-01-23 ENCOUNTER — Ambulatory Visit: Payer: Medicare Other | Admitting: *Deleted

## 2015-01-23 ENCOUNTER — Encounter: Payer: Self-pay | Admitting: *Deleted

## 2015-01-23 DIAGNOSIS — R531 Weakness: Secondary | ICD-10-CM | POA: Diagnosis not present

## 2015-01-23 DIAGNOSIS — M25612 Stiffness of left shoulder, not elsewhere classified: Secondary | ICD-10-CM

## 2015-01-23 DIAGNOSIS — M79605 Pain in left leg: Secondary | ICD-10-CM | POA: Diagnosis not present

## 2015-01-23 DIAGNOSIS — M25512 Pain in left shoulder: Secondary | ICD-10-CM | POA: Diagnosis not present

## 2015-01-23 DIAGNOSIS — R2681 Unsteadiness on feet: Secondary | ICD-10-CM

## 2015-01-23 NOTE — Therapy (Signed)
[facility] [place], [place], [address] Phone: [phone]   Fax:  [phone]  Physical Therapy Treatment  Patient Details  Name: Haley Trevino MRN: [id number] Date of Birth: 03/26/1941 Referring Provider:  [name], *  Encounter Date: 01/23/2015      PT End of Session - 01/23/15 1127    Visit Number 5   Number of Visits 8   Date for PT Re-Evaluation 01/31/15   PT Start Time 1115   PT Stop Time 1201   PT Time Calculation (min) 46 min      Past Medical History  Diagnosis Date  . Raynaud disease   . Scleroderma   . Osteoarthritis   . Dyspnea   . Thyroid disease     hypothyroidism  . Cataract     Past Surgical History  Procedure Laterality Date  . Cholecystectomy  1973  . Total abdominal hysterectomy  1974  . Breast enhancement surgery  1975  . Hand surgery  08/2012; 11/2012  . Melanoma excision      at 73 yrs of age  . Orif hip fracture Right 02/22/2014    Procedure: OPEN REDUCTION INTERNAL FIXATION RIGHT HIP;  Surgeon: Sanjuana Kava, MD;  Location: [facility];  Service: Orthopedics;  Laterality: Right;    There were no vitals filed for this visit.  Visit Diagnosis:  Left shoulder pain  Stiffness of left shoulder joint  Generalized weakness  Unsteadiness  Leg pain, left      Subjective Assessment - 01/23/15 1121    Subjective States that she is a little sore from previous treatment in her legs and her shoulder hurt her bad yesterday.   Limitations House hold activities;Other (comment)   Diagnostic tests xray   Patient Stated Goals want to be able to use arm like she used to for ADLs including doffing shirt, bra   Currently in Pain? Yes   Pain Score 5    Pain Location Shoulder   Pain Orientation Left   Pain Descriptors / Indicators Sore   Pain Onset More than a month ago   Pain Frequency Constant   Aggravating Factors  driving and undressing   Pain Relieving Factors rest, meds                          [facility] Adult PT Treatment/Exercise - 01/23/15 0001    Exercises   Exercises Shoulder;Knee/Hip   Knee/Hip Exercises: Aerobic   Nustep L5 x10 min   Knee/Hip Exercises: Standing   Heel Raises Both;3 sets;10 reps   Lateral Step Up Both;2 sets;10 reps;Hand Hold: 2;Step Height: 6"   Forward Step Up Both;2 sets;10 reps;Hand Hold: 2;Step Height: 6"   Rocker Board 3 minutes  calf / balance   Other Standing Knee Exercises Balance on BOSU inverted ball   Shoulder Exercises: Standing   Other Standing Exercises RW 4 red tube x10 each   Other Standing Exercises UE ranger flexion, circles each way 2x10 LT shldr   Shoulder Exercises: Pulleys   Flexion 3 minutes                  PT Short Term Goals - 01/18/15 1139    PT SHORT TERM GOAL #1   Title I with initial HEP   Time 2   Period Weeks   Status Achieved   PT SHORT TERM GOAL #2   Title Decreased pain in left shoulder by 30% (01/31/15)   Time  4   Period Weeks   Status On-going   PT SHORT TERM GOAL #3   Title able to undress with 50% greater ease (01/31/15)   Time 4   Period Weeks   Status On-going   PT SHORT TERM GOAL #4   Title no incidence of falls in 3 weeks (01/31/15)   Time 4   Period Weeks   Status On-going           PT Long Term Goals - 01/16/15 1219    PT LONG TERM GOAL #1   Title  I with advanced HEP   Time 8   Period Weeks   Status On-going   PT LONG TERM GOAL #2   Title demo improved left shoulder strength to 4+/5 to improve function   Time 8   Period Weeks   Status On-going   PT LONG TERM GOAL #3   Title improved left shoulder motion to be able to drive and undress   Time 8   Period Weeks   Status On-going   PT LONG TERM GOAL #4   Title demo 4+/5 right hip strength to improve function   Time 8   Period Weeks   Status On-going   PT LONG TERM GOAL #5   Title improve FOTO to 40%   Time 8   Period Weeks   Status On-going               Plan -  01/24/2015 1251    Clinical Impression Statement Pt did fairly well with RX today for LT shldr and RT hip and balance. She still has discomfort with shldr exs, but feels that it's doing a little better if she doesn't over do it at home. She still feels that her RT LE gives away at times. No new goals met today and are ongoing   Pt will benefit from skilled therapeutic intervention in order to improve on the following deficits Decreased range of motion;Difficulty walking;Impaired UE functional use;Pain;Decreased balance;Impaired flexibility;Decreased strength;Postural dysfunction   Rehab Potential Good   PT Frequency 2x / week   PT Duration 8 weeks   PT Treatment/Interventions ADLs/Self Care Home Management;Cryotherapy;Electrical Stimulation;Iontophoresis 54m/ml Dexamethasone;Moist Heat;Ultrasound;Therapeutic exercise;Balance training;Neuromuscular re-education;Patient/family education;Passive range of motion;Manual techniques;Taping;Vasopneumatic Device   PT Next Visit Plan Continue per PT POC regarding LT shoulder, balance          G-Codes - 007/20/20161257    Functional Assessment Tool Used FOTO 5th visit limitation 47%      Problem List Patient Active Problem List   Diagnosis Date Noted  . Impingement syndrome of left shoulder region 12/08/2014  . Osteoporosis 09/20/2014  . Hip fracture requiring operative repair 02/22/2014  . Hypothyroidism 11/29/2013  . Depression 11/29/2013  . GERD (gastroesophageal reflux disease) 11/29/2013  . Hypertension 11/29/2013  . Hyperlipidemia with target LDL less than 100 11/29/2013  . Thyroid cyst 04/26/2013  . MALIGNANT MELANOMA, SKIN 08/07/2010  . DYSPNEA 08/07/2010  . [name] 08/06/2010  . SCLERODERMA 08/06/2010  . OSTEOARTHRITIS 08/06/2010    RAMSEUR,CHRIS, PTA 72016-07-20 1:02 PM  [facility] [address]Phone: [phone]  Fax:  [phone]

## 2015-01-25 ENCOUNTER — Encounter: Payer: Self-pay | Admitting: *Deleted

## 2015-01-25 ENCOUNTER — Ambulatory Visit: Payer: Medicare Other | Admitting: *Deleted

## 2015-01-25 DIAGNOSIS — R531 Weakness: Secondary | ICD-10-CM | POA: Diagnosis not present

## 2015-01-25 DIAGNOSIS — M25512 Pain in left shoulder: Secondary | ICD-10-CM | POA: Diagnosis not present

## 2015-01-25 DIAGNOSIS — R2681 Unsteadiness on feet: Secondary | ICD-10-CM

## 2015-01-25 DIAGNOSIS — M25612 Stiffness of left shoulder, not elsewhere classified: Secondary | ICD-10-CM

## 2015-01-25 DIAGNOSIS — M79605 Pain in left leg: Secondary | ICD-10-CM | POA: Diagnosis not present

## 2015-01-25 NOTE — Therapy (Signed)
[facility] [place], [place], [address] Phone: [phone]   Fax:  [phone]  Physical Therapy Treatment  Patient Details  Name: Haley Trevino MRN: [id number] Date of Birth: June 04, 1941 Referring Provider:  [name], *  Encounter Date: 01/25/2015      PT End of Session - 01/25/15 1119    Visit Number 6   Number of Visits 8   Date for PT Re-Evaluation 01/31/15   PT Start Time 1115   PT Stop Time 1205   PT Time Calculation (min) 50 min      Past Medical History  Diagnosis Date  . Raynaud disease   . Scleroderma   . Osteoarthritis   . Dyspnea   . Thyroid disease     hypothyroidism  . Cataract     Past Surgical History  Procedure Laterality Date  . Cholecystectomy  1973  . Total abdominal hysterectomy  1974  . Breast enhancement surgery  1975  . Hand surgery  08/2012; 11/2012  . Melanoma excision      at 62 yrs of age  . Orif hip fracture Right 02/22/2014    Procedure: OPEN REDUCTION INTERNAL FIXATION RIGHT HIP;  Surgeon: Sanjuana Kava, MD;  Location: [facility];  Service: Orthopedics;  Laterality: Right;    There were no vitals filed for this visit.  Visit Diagnosis:  Left shoulder pain  Stiffness of left shoulder joint  Generalized weakness  Unsteadiness      Subjective Assessment - 01/25/15 1125    Subjective Lt shldr always hurts 2-3/10, RT hip always hurts also 2-3/10   Limitations House hold activities;Other (comment)   Diagnostic tests xray   Patient Stated Goals want to be able to use arm like she used to for ADLs including doffing shirt, bra   Currently in Pain? Yes   Pain Score 3    Pain Location Shoulder   Pain Orientation Left   Pain Descriptors / Indicators Sore   Pain Onset More than a month ago   Pain Frequency Constant   Aggravating Factors  driving, dressing   Pain Relieving Factors rest, meds            [facility] PT Assessment - 01/25/15 0001    AROM   Left  Shoulder Flexion 125 Degrees   Left Shoulder Internal Rotation --  HBB to LT SIJ   Left Shoulder External Rotation 45 Degrees                     [facility] Adult PT Treatment/Exercise - 01/25/15 0001    Exercises   Exercises Shoulder;Knee/Hip   Knee/Hip Exercises: Aerobic   Nustep L5 x77min seat 7   Knee/Hip Exercises: Standing   Lateral Step Up Both;2 sets;10 reps;Hand Hold: 2;Step Height: 6"   Forward Step Up Both;2 sets;10 reps;Hand Hold: 2;Step Height: 6"   Rocker Board 3 minutes   Other Standing Knee Exercises Balance on BOSU inverted ball   Shoulder Exercises: Standing   Other Standing Exercises RW 4 red tube x10 each   Other Standing Exercises UE ranger flexion, circles each way 2x10 LT shldr   Shoulder Exercises: Pulleys   Flexion 3 minutes          PROM to LT shldr for ER,IR, and flexion              PT Short Term Goals - 01/18/15 1139    PT SHORT TERM GOAL #1   Title I with initial HEP  Time 2   Period Weeks   Status Achieved   PT SHORT TERM GOAL #2   Title Decreased pain in left shoulder by 30% (01/31/15)   Time 4   Period Weeks   Status On-going   PT SHORT TERM GOAL #3   Title able to undress with 50% greater ease (01/31/15)   Time 4   Period Weeks   Status On-going   PT SHORT TERM GOAL #4   Title no incidence of falls in 3 weeks (01/31/15)   Time 4   Period Weeks   Status On-going           PT Long Term Goals - 01/16/15 1219    PT LONG TERM GOAL #1   Title  I with advanced HEP   Time 8   Period Weeks   Status On-going   PT LONG TERM GOAL #2   Title demo improved left shoulder strength to 4+/5 to improve function   Time 8   Period Weeks   Status On-going   PT LONG TERM GOAL #3   Title improved left shoulder motion to be able to drive and undress   Time 8   Period Weeks   Status On-going   PT LONG TERM GOAL #4   Title demo 4+/5 right hip strength to improve function   Time 8   Period Weeks   Status On-going   PT LONG  TERM GOAL #5   Title improve FOTO to 40%   Time 8   Period Weeks   Status On-going               Plan - 01/25/15 1119    Clinical Impression Statement Pt did well with exs today with minimal pain increase. Her LT shldr ROM is doing a little better, but not enough to meet goals. She is still limited with reaching behind her back and has increased pain. She tolerated rt hip piriformis stretch and did well. Goals are ongoing at this time   Pt will benefit from skilled therapeutic intervention in order to improve on the following deficits Decreased range of motion;Difficulty walking;Impaired UE functional use;Pain;Decreased balance;Impaired flexibility;Decreased strength;Postural dysfunction   Rehab Potential Good   PT Frequency 2x / week   PT Duration 8 weeks   PT Treatment/Interventions ADLs/Self Care Home Management;Cryotherapy;Electrical Stimulation;Iontophoresis 4mg /ml Dexamethasone;Moist Heat;Ultrasound;Therapeutic exercise;Balance training;Neuromuscular re-education;Patient/family education;Passive range of motion;Manual techniques;Taping;Vasopneumatic Device   PT Next Visit Plan Continue per PT POC regarding LT shoulder, balance, [place] and PROM to LT shldr   Consulted and Agree with Plan of Care Patient        Problem List Patient Active Problem List   Diagnosis Date Noted  . Impingement syndrome of left shoulder region 12/08/2014  . Osteoporosis 09/20/2014  . Hip fracture requiring operative repair 02/22/2014  . Hypothyroidism 11/29/2013  . Depression 11/29/2013  . GERD (gastroesophageal reflux disease) 11/29/2013  . Hypertension 11/29/2013  . Hyperlipidemia with target LDL less than 100 11/29/2013  . Thyroid cyst 04/26/2013  . MALIGNANT MELANOMA, SKIN 08/07/2010  . DYSPNEA 08/07/2010  . [name] 08/06/2010  . SCLERODERMA 08/06/2010  . OSTEOARTHRITIS 08/06/2010    Lashae Wollenberg,CHRIS, PTA 01/25/2015, 12:16 PM  [facility] [address] [place], [place], [address] Phone: [phone]   Fax:  [phone]

## 2015-01-26 ENCOUNTER — Telehealth: Payer: Self-pay | Admitting: Pharmacist

## 2015-01-26 NOTE — Telephone Encounter (Signed)
I have checked cost of Reclast for patient - per our insurance dept.  Cost of medication would be $360 + $50 copay (this if for administration at [facility]). Left message for patient to contact me to discuss.

## 2015-01-30 ENCOUNTER — Encounter: Payer: Self-pay | Admitting: Physical Therapy

## 2015-01-30 ENCOUNTER — Ambulatory Visit: Payer: Medicare Other | Admitting: Physical Therapy

## 2015-01-30 DIAGNOSIS — R531 Weakness: Secondary | ICD-10-CM

## 2015-01-30 DIAGNOSIS — M25612 Stiffness of left shoulder, not elsewhere classified: Secondary | ICD-10-CM

## 2015-01-30 DIAGNOSIS — R2681 Unsteadiness on feet: Secondary | ICD-10-CM

## 2015-01-30 DIAGNOSIS — M79605 Pain in left leg: Secondary | ICD-10-CM

## 2015-01-30 DIAGNOSIS — M25512 Pain in left shoulder: Secondary | ICD-10-CM

## 2015-01-30 NOTE — Therapy (Signed)
[facility] [place], [place], [address] Phone: [phone]   Fax:  [phone]  Physical Therapy Treatment  Patient Details  Name: Haley Trevino MRN: [id number] Date of Birth: December 05, 1940 Referring Provider:  [name], *  Encounter Date: 01/30/2015      PT End of Session - 01/30/15 1200    Visit Number 7   Number of Visits 8   Date for PT Re-Evaluation 01/31/15   PT Start Time 1117   PT Stop Time 1200   PT Time Calculation (min) 43 min   Activity Tolerance Patient tolerated treatment well   Behavior During Therapy [facility] for tasks assessed/performed      Past Medical History  Diagnosis Date  . Raynaud disease   . Scleroderma   . Osteoarthritis   . Dyspnea   . Thyroid disease     hypothyroidism  . Cataract     Past Surgical History  Procedure Laterality Date  . Cholecystectomy  1973  . Total abdominal hysterectomy  1974  . Breast enhancement surgery  1975  . Hand surgery  08/2012; 11/2012  . Melanoma excision      at 64 yrs of age  . Orif hip fracture Right 02/22/2014    Procedure: OPEN REDUCTION INTERNAL FIXATION RIGHT HIP;  Surgeon: Sanjuana Kava, MD;  Location: [facility];  Service: Orthopedics;  Laterality: Right;    There were no vitals filed for this visit.  Visit Diagnosis:  Left shoulder pain  Stiffness of left shoulder joint  Generalized weakness  Unsteadiness  Leg pain, left      Subjective Assessment - 01/30/15 1124    Subjective Lt shldr always hurts 0 up to 3-4/10 pain after reaching back for seat belt, RT hip always hurts 3-4/10   Limitations House hold activities;Other (comment)   Diagnostic tests xray   Patient Stated Goals want to be able to use arm like she used to for ADLs including doffing shirt, bra   Currently in Pain? Yes   Pain Score 4    Pain Location Shoulder   Pain Orientation Left   Pain Descriptors / Indicators Sore   Pain Onset More than a month ago    Pain Frequency Intermittent   Aggravating Factors  doffing shirt or driving   Pain Relieving Factors rest meds   Multiple Pain Sites Yes   Pain Score 4   Pain Location Hip   Pain Orientation Right   Pain Descriptors / Indicators Aching   Pain Type Chronic pain   Pain Onset More than a month ago   Pain Frequency Constant   Aggravating Factors  prolong activity   Pain Relieving Factors rest                         [facility] Adult PT Treatment/Exercise - 01/30/15 0001    Knee/Hip Exercises: Standing   Heel Raises Both;3 sets;10 reps   Lateral Step Up Both;10 reps;Hand Hold: 2;Step Height: 6";3 sets   Forward Step Up Both;10 reps;Hand Hold: 2;Step Height: 6";3 sets   Rocker Board 3 minutes   Shoulder Exercises: Standing   Other Standing Exercises RW 4 red t-band 2-3x10 each   Modalities   Modalities Ultrasound   Ultrasound   Ultrasound Location left shoulder   Ultrasound Parameters 1.5w/cm2/50%/3.23mhz x23min to left inf shoulder   Ultrasound Goals Pain   Manual Therapy   Manual Therapy Passive ROM   Passive ROM low load holds for flexion  and ER                  PT Short Term Goals - 01/30/15 1203    PT SHORT TERM GOAL #1   Title I with initial HEP   Time 2   Period Weeks   Status Achieved   PT SHORT TERM GOAL #2   Title Decreased pain in left shoulder by 30% (01/31/15)   Time 4   Period Weeks   Status On-going  20%   PT SHORT TERM GOAL #3   Title able to undress with 50% greater ease (01/31/15)   Time 4   Status On-going  20%   PT SHORT TERM GOAL #4   Title no incidence of falls in 3 weeks (01/31/15)   Time 4   Period Weeks   Status Achieved           PT Long Term Goals - 01/16/15 1219    PT LONG TERM GOAL #1   Title  I with advanced HEP   Time 8   Period Weeks   Status On-going   PT LONG TERM GOAL #2   Title demo improved left shoulder strength to 4+/5 to improve function   Time 8   Period Weeks   Status On-going   PT LONG TERM  GOAL #3   Title improved left shoulder motion to be able to drive and undress   Time 8   Period Weeks   Status On-going   PT LONG TERM GOAL #4   Title demo 4+/5 right hip strength to improve function   Time 8   Period Weeks   Status On-going   PT LONG TERM GOAL #5   Title improve FOTO to 40%   Time 8   Period Weeks   Status On-going               Plan - 01/30/15 1200    Clinical Impression Statement Patient tolerated treatment well today with no reports of pain increase. Patient reported pain in left shoulder 1/10 when leaving the house then 3-4/10 after reaching for seat belt today. Met STG #4 toady, Unable to meet LTG's due to pain, ROM and limitations with ADL's due to deficits in left shoulder.    Pt will benefit from skilled therapeutic intervention in order to improve on the following deficits Decreased range of motion;Difficulty walking;Impaired UE functional use;Pain;Decreased balance;Impaired flexibility;Decreased strength;Postural dysfunction   Rehab Potential Good   PT Frequency 2x / week   PT Duration 8 weeks   PT Treatment/Interventions ADLs/Self Care Home Management;Cryotherapy;Electrical Stimulation;Iontophoresis 4mg /ml Dexamethasone;Moist Heat;Ultrasound;Therapeutic exercise;Balance training;Neuromuscular re-education;Patient/family education;Passive range of motion;Manual techniques;Taping;Vasopneumatic Device   PT Next Visit Plan Continue per PT POC regarding LT shoulder, balance, [place] and PROM to LT shldr   Consulted and Agree with Plan of Care Patient        Problem List Patient Active Problem List   Diagnosis Date Noted  . Impingement syndrome of left shoulder region 12/08/2014  . Osteoporosis 09/20/2014  . Hip fracture requiring operative repair 02/22/2014  . Hypothyroidism 11/29/2013  . Depression 11/29/2013  . GERD (gastroesophageal reflux disease) 11/29/2013  . Hypertension 11/29/2013  . Hyperlipidemia with target LDL less than 100 11/29/2013   . Thyroid cyst 04/26/2013  . MALIGNANT MELANOMA, SKIN 08/07/2010  . DYSPNEA 08/07/2010  . [name] 08/06/2010  . SCLERODERMA 08/06/2010  . OSTEOARTHRITIS 08/06/2010    Donye Dauenhauer P, PTA 01/30/2015, 12:06 PM  [facility] [address]  [place], [place], [address] Phone: [phone]   Fax:  [phone]

## 2015-02-01 ENCOUNTER — Encounter: Payer: Self-pay | Admitting: Physical Therapy

## 2015-02-01 ENCOUNTER — Ambulatory Visit: Payer: Medicare Other | Admitting: Physical Therapy

## 2015-02-01 DIAGNOSIS — R2681 Unsteadiness on feet: Secondary | ICD-10-CM

## 2015-02-01 DIAGNOSIS — M25612 Stiffness of left shoulder, not elsewhere classified: Secondary | ICD-10-CM

## 2015-02-01 DIAGNOSIS — M79605 Pain in left leg: Secondary | ICD-10-CM

## 2015-02-01 DIAGNOSIS — M25512 Pain in left shoulder: Secondary | ICD-10-CM | POA: Diagnosis not present

## 2015-02-01 DIAGNOSIS — R531 Weakness: Secondary | ICD-10-CM

## 2015-02-01 NOTE — Therapy (Signed)
[facility] [place], [place], [address] Phone: [phone]   Fax:  [phone]  Physical Therapy Treatment  Patient Details  Name: Haley Trevino MRN: [id number] Date of Birth: [birth year] Referring Provider:  [name], *  Encounter Date: 02/01/2015      PT End of Session - 02/01/15 1319    Visit Number 8   Number of Visits 16   Date for PT Re-Evaluation 02/28/15  Date changed per MD. Onnie Graham initial summary   PT Start Time 1231   PT Stop Time 1317   PT Time Calculation (min) 46 min   Activity Tolerance Patient tolerated treatment well   Behavior During Therapy [facility] for tasks assessed/performed      Past Medical History  Diagnosis Date  . Raynaud disease   . Scleroderma   . Osteoarthritis   . Dyspnea   . Thyroid disease     hypothyroidism  . Cataract     Past Surgical History  Procedure Laterality Date  . Cholecystectomy  1973  . Total abdominal hysterectomy  1974  . Breast enhancement surgery  1975  . Hand surgery  08/2012; 11/2012  . Melanoma excision      at 73 yrs of age  . Orif hip fracture Right 02/22/2014    Procedure: OPEN REDUCTION INTERNAL FIXATION RIGHT HIP;  Surgeon: Sanjuana Kava, MD;  Location: [facility];  Service: Orthopedics;  Laterality: Right;    There were no vitals filed for this visit.  Visit Diagnosis:  Left shoulder pain  Stiffness of left shoulder joint  Generalized weakness  Unsteadiness  Leg pain, left      Subjective Assessment - 02/01/15 1236    Subjective Shoulder felt better after last treatment   Limitations House hold activities;Other (comment)   Patient Stated Goals want to be able to use arm like she used to for ADLs including doffing shirt, bra   Currently in Pain? Yes   Pain Score 4    Pain Location Shoulder   Pain Orientation Left   Pain Descriptors / Indicators Sore   Pain Onset More than a month ago   Pain Frequency Intermittent   Aggravating Factors  donning shirt or driving   Pain Relieving Factors rest and meds   Pain Score 4   Pain Location Hip   Pain Orientation Right   Pain Descriptors / Indicators Aching   Pain Type Chronic pain   Pain Onset More than a month ago   Pain Frequency Constant   Aggravating Factors  prolong activity   Pain Relieving Factors rest                         [facility] Adult PT Treatment/Exercise - 02/01/15 0001    Knee/Hip Exercises: Aerobic   Nustep L5 x13min   Knee/Hip Exercises: Standing   Hip ADduction Strengthening;Right;2 sets;15 reps  red t-band   Lateral Step Up 10 reps;Step Height: 6";3 sets;Right   Forward Step Up 10 reps;Hand Hold: 2;Step Height: 6";3 sets;Right   Rocker Board 3 minutes   Shoulder Exercises: Standing   ABduction --  red t-band   Other Standing Exercises RW4 with yellow t-band x30 each   Ultrasound   Ultrasound Location left shoulder   Ultrasound Parameters 1.5w/cm2/50%/3.3 mhz x70min                  PT Short Term Goals - 01/30/15 1203    PT SHORT TERM GOAL #1  Title I with initial HEP   Time 2   Period Weeks   Status Achieved   PT SHORT TERM GOAL #2   Title Decreased pain in left shoulder by 30% (01/31/15)   Time 4   Period Weeks   Status On-going  20%   PT SHORT TERM GOAL #3   Title able to undress with 50% greater ease (01/31/15)   Time 4   Status On-going  20%   PT SHORT TERM GOAL #4   Title no incidence of falls in 3 weeks (01/31/15)   Time 4   Period Weeks   Status Achieved           PT Long Term Goals - 01/16/15 1219    PT LONG TERM GOAL #1   Title  I with advanced HEP   Time 8   Period Weeks   Status On-going   PT LONG TERM GOAL #2   Title demo improved left shoulder strength to 4+/5 to improve function   Time 8   Period Weeks   Status On-going   PT LONG TERM GOAL #3   Title improved left shoulder motion to be able to drive and undress   Time 8   Period Weeks   Status On-going   PT  LONG TERM GOAL #4   Title demo 4+/5 right hip strength to improve function   Time 8   Period Weeks   Status On-going   PT LONG TERM GOAL #5   Title improve FOTO to 40%   Time 8   Period Weeks   Status On-going               Plan - 02/01/15 1320    Clinical Impression Statement Patient continues to progress with all activities. Patient reported no soreness the night after last treatment in left shouler. Patient able to tolerate strengthening exercises and has responded well to [place] thus far. Goals ongoing due to pain deficits with ADL's.   Pt will benefit from skilled therapeutic intervention in order to improve on the following deficits Decreased range of motion;Difficulty walking;Impaired UE functional use;Pain;Decreased balance;Impaired flexibility;Decreased strength;Postural dysfunction   Rehab Potential Good   PT Frequency 2x / week   PT Duration 8 weeks   PT Treatment/Interventions ADLs/Self Care Home Management;Cryotherapy;Electrical Stimulation;Iontophoresis 4mg /ml Dexamethasone;Moist Heat;Ultrasound;Therapeutic exercise;Balance training;Neuromuscular re-education;Patient/family education;Passive range of motion;Manual techniques;Taping;Vasopneumatic Device   PT Next Visit Plan Continue per PT POC regarding LT shoulder, balance, [place] and PROM to LT shldr   Consulted and Agree with Plan of Care Patient        Problem List Patient Active Problem List   Diagnosis Date Noted  . Impingement syndrome of left shoulder region 12/08/2014  . Osteoporosis 09/20/2014  . Hip fracture requiring operative repair 02/22/2014  . Hypothyroidism 11/29/2013  . Depression 11/29/2013  . GERD (gastroesophageal reflux disease) 11/29/2013  . Hypertension 11/29/2013  . Hyperlipidemia with target LDL less than 100 11/29/2013  . Thyroid cyst 04/26/2013  . MALIGNANT MELANOMA, SKIN 08/07/2010  . DYSPNEA 08/07/2010  . [name] 08/06/2010  . SCLERODERMA 08/06/2010  . OSTEOARTHRITIS  08/06/2010    Briceida Rasberry P, PTA 02/01/2015, 1:23 PM  [facility] [address] [place], [place], [address] Phone: [phone]   Fax:  [phone]

## 2015-02-05 NOTE — Telephone Encounter (Signed)
Called patient to follow up on Reclast cost.  She states that currently with PT twice a week that she cannot possibly afford Reclast or Prolia which would be about $170 every 6 months.   I discussed oral bisphosphonate and patient declined.  She tried alendronate in patient and has "terrible reflux".

## 2015-02-06 ENCOUNTER — Ambulatory Visit: Payer: Medicare Other | Admitting: Physical Therapy

## 2015-02-08 ENCOUNTER — Ambulatory Visit: Payer: Medicare Other | Attending: Orthopedic Surgery | Admitting: Physical Therapy

## 2015-02-08 ENCOUNTER — Encounter: Payer: Self-pay | Admitting: Physical Therapy

## 2015-02-08 DIAGNOSIS — M79605 Pain in left leg: Secondary | ICD-10-CM | POA: Insufficient documentation

## 2015-02-08 DIAGNOSIS — M25512 Pain in left shoulder: Secondary | ICD-10-CM | POA: Diagnosis not present

## 2015-02-08 DIAGNOSIS — R2681 Unsteadiness on feet: Secondary | ICD-10-CM | POA: Insufficient documentation

## 2015-02-08 DIAGNOSIS — R531 Weakness: Secondary | ICD-10-CM | POA: Diagnosis not present

## 2015-02-08 DIAGNOSIS — M25612 Stiffness of left shoulder, not elsewhere classified: Secondary | ICD-10-CM | POA: Diagnosis not present

## 2015-02-08 NOTE — Therapy (Signed)
[facility] [place], [place], [address] Phone: [phone]   Fax:  [phone]  Physical Therapy Treatment  Patient Details  Name: Haley Trevino MRN: [id number] Date of Birth: 1940-07-30 Referring Provider:  [name], *  Encounter Date: 02/08/2015      PT End of Session - 02/08/15 1241    Visit Number 9   Number of Visits 16   Date for PT Re-Evaluation 02/28/15   PT Start Time 1228   PT Stop Time 1312   PT Time Calculation (min) 44 min   Activity Tolerance Patient tolerated treatment well   Behavior During Therapy [facility] for tasks assessed/performed      Past Medical History  Diagnosis Date  . Raynaud disease   . Scleroderma   . Osteoarthritis   . Dyspnea   . Thyroid disease     hypothyroidism  . Cataract     Past Surgical History  Procedure Laterality Date  . Cholecystectomy  1973  . Total abdominal hysterectomy  1974  . Breast enhancement surgery  1975  . Hand surgery  08/2012; 11/2012  . Melanoma excision      at 59 yrs of age  . Orif hip fracture Right 02/22/2014    Procedure: OPEN REDUCTION INTERNAL FIXATION RIGHT HIP;  Surgeon: Sanjuana Kava, MD;  Location: [facility];  Service: Orthopedics;  Laterality: Right;    There were no vitals filed for this visit.  Visit Diagnosis:  Left shoulder pain  Stiffness of left shoulder joint  Generalized weakness  Unsteadiness  Leg pain, left      Subjective Assessment - 02/08/15 1229    Subjective soreness in shoulder continues, some better with hip per patient   Limitations House hold activities;Other (comment)   Diagnostic tests xray   Patient Stated Goals want to be able to use arm like she used to for ADLs including doffing shirt, bra   Currently in Pain? Yes   Pain Score 4    Pain Location Shoulder   Pain Orientation Left   Pain Descriptors / Indicators Sore   Pain Onset More than a month ago   Aggravating Factors  donning shirt or  driving   Pain Relieving Factors rest   Multiple Pain Sites Yes   Pain Score 3   Pain Location Hip   Pain Orientation Right   Pain Descriptors / Indicators Aching   Pain Type Chronic pain   Pain Onset More than a month ago   Pain Frequency Constant   Aggravating Factors  prolong activity   Pain Relieving Factors rest            [facility] PT Assessment - 02/08/15 0001    ROM / Strength   AROM / PROM / Strength Strength   Strength   Right/Left Hip Right   Right Hip ABduction 4/5                     [facility] Adult PT Treatment/Exercise - 02/08/15 0001    Knee/Hip Exercises: Aerobic   Nustep L5 x73min   Knee/Hip Exercises: Standing   Hip ADduction Strengthening;Right;2 sets;15 reps  Red t-band   Lateral Step Up 10 reps;Step Height: 6";3 sets;Right   Forward Step Up 10 reps;Hand Hold: 2;Step Height: 6";3 sets;Right   Rocker Board 3 minutes   Ultrasound   Ultrasound Location left shoulder   Ultrasound Parameters 1.2w/cm2/50%/3.52mhz x35min   Ultrasound Goals Pain   Manual Therapy   Manual Therapy Myofascial release  Myofascial Release gentle STW to left shoulder                  PT Short Term Goals - 01/30/15 1203    PT SHORT TERM GOAL #1   Title I with initial HEP   Time 2   Period Weeks   Status Achieved   PT SHORT TERM GOAL #2   Title Decreased pain in left shoulder by 30% (01/31/15)   Time 4   Period Weeks   Status On-going  20%   PT SHORT TERM GOAL #3   Title able to undress with 50% greater ease (01/31/15)   Time 4   Status On-going  20%   PT SHORT TERM GOAL #4   Title no incidence of falls in 3 weeks (01/31/15)   Time 4   Period Weeks   Status Achieved           PT Long Term Goals - 01/16/15 1219    PT LONG TERM GOAL #1   Title  I with advanced HEP   Time 8   Period Weeks   Status On-going   PT LONG TERM GOAL #2   Title demo improved left shoulder strength to 4+/5 to improve function   Time 8   Period Weeks   Status  On-going   PT LONG TERM GOAL #3   Title improved left shoulder motion to be able to drive and undress   Time 8   Period Weeks   Status On-going   PT LONG TERM GOAL #4   Title demo 4+/5 right hip strength to improve function   Time 8   Period Weeks   Status On-going   PT LONG TERM GOAL #5   Title improve FOTO to 40%   Time 8   Period Weeks   Status On-going               Plan - 02/08/15 1242    Clinical Impression Statement Patient is progressing with reports of improvement in right hip today, some continued soreness in left shoulder. Patient has difficulty with ADL's due to shoulder pain. Unable to meet any further goals due to pain deficits.   Pt will benefit from skilled therapeutic intervention in order to improve on the following deficits Decreased range of motion;Difficulty walking;Impaired UE functional use;Pain;Decreased balance;Impaired flexibility;Decreased strength;Postural dysfunction   Rehab Potential Good   PT Frequency 2x / week   PT Duration 8 weeks   PT Treatment/Interventions ADLs/Self Care Home Management;Cryotherapy;Electrical Stimulation;Iontophoresis 4mg /ml Dexamethasone;Moist Heat;Ultrasound;Therapeutic exercise;Balance training;Neuromuscular re-education;Patient/family education;Passive range of motion;Manual techniques;Taping;Vasopneumatic Device   PT Next Visit Plan Continue per PT POC regarding LT shoulder, balance, [place] and PROM to LT shldr   Consulted and Agree with Plan of Care Patient        Problem List Patient Active Problem List   Diagnosis Date Noted  . Impingement syndrome of left shoulder region 12/08/2014  . Osteoporosis 09/20/2014  . Hip fracture requiring operative repair 02/22/2014  . Hypothyroidism 11/29/2013  . Depression 11/29/2013  . GERD (gastroesophageal reflux disease) 11/29/2013  . Hypertension 11/29/2013  . Hyperlipidemia with target LDL less than 100 11/29/2013  . Thyroid cyst 04/26/2013  . MALIGNANT MELANOMA, SKIN  08/07/2010  . DYSPNEA 08/07/2010  . [name] 08/06/2010  . SCLERODERMA 08/06/2010  . OSTEOARTHRITIS 08/06/2010    Kyndel Egger P, PTA 02/08/2015, 1:17 PM  [facility] [address] [place], [place], [address] Phone: [phone]   Fax:  [phone]

## 2015-02-13 ENCOUNTER — Ambulatory Visit: Payer: Medicare Other | Admitting: *Deleted

## 2015-02-13 DIAGNOSIS — R2681 Unsteadiness on feet: Secondary | ICD-10-CM | POA: Diagnosis not present

## 2015-02-13 DIAGNOSIS — M25612 Stiffness of left shoulder, not elsewhere classified: Secondary | ICD-10-CM

## 2015-02-13 DIAGNOSIS — R531 Weakness: Secondary | ICD-10-CM | POA: Diagnosis not present

## 2015-02-13 DIAGNOSIS — M25512 Pain in left shoulder: Secondary | ICD-10-CM | POA: Diagnosis not present

## 2015-02-13 DIAGNOSIS — M79605 Pain in left leg: Secondary | ICD-10-CM | POA: Diagnosis not present

## 2015-02-13 NOTE — Therapy (Signed)
[facility] [place], [place], [address] Phone: [phone]   Fax:  [phone]  Physical Therapy Treatment  Patient Details  Name: Haley Trevino MRN: [id number] Date of Birth: 06/28/1941 Referring Provider:  [name], *  Encounter Date: 02/13/2015      PT End of Session - 02/13/15 1212    Visit Number 10   Number of Visits 16   Date for PT Re-Evaluation 02/28/15   PT Start Time 1117   PT Stop Time 1208   PT Time Calculation (min) 51 min   Activity Tolerance Patient tolerated treatment well   Behavior During Therapy [facility] for tasks assessed/performed      Past Medical History  Diagnosis Date  . Raynaud disease   . Scleroderma   . Osteoarthritis   . Dyspnea   . Thyroid disease     hypothyroidism  . Cataract     Past Surgical History  Procedure Laterality Date  . Cholecystectomy  1973  . Total abdominal hysterectomy  1974  . Breast enhancement surgery  1975  . Hand surgery  08/2012; 11/2012  . Melanoma excision      at 54 yrs of age  . Orif hip fracture Right 02/22/2014    Procedure: OPEN REDUCTION INTERNAL FIXATION RIGHT HIP;  Surgeon: Sanjuana Kava, MD;  Location: [facility];  Service: Orthopedics;  Laterality: Right;    There were no vitals filed for this visit.  Visit Diagnosis:  Left shoulder pain  Stiffness of left shoulder joint  Generalized weakness  Unsteadiness  Leg pain, left      Subjective Assessment - 02/13/15 1218    Subjective Left shoulder pain; right hip pain is much better.   Limitations House hold activities   Currently in Pain? Yes   Pain Score 5    Pain Location Shoulder   Pain Orientation Right;Left   Pain Descriptors / Indicators Dull;Sore   Pain Radiating Towards lateral aspect left shoulder   Pain Onset More than a month ago   Pain Frequency Intermittent   Aggravating Factors  everyday activities   Pain Relieving Factors rest   Multiple Pain Sites Yes    Pain Score 2   Pain Location Hip   Pain Orientation Right   Pain Descriptors / Indicators Aching   Pain Type Chronic pain   Pain Onset More than a month ago   Pain Frequency Constant                         [facility] Adult PT Treatment/Exercise - 02/13/15 0001    Knee/Hip Exercises: Aerobic   Nustep L5 x70min   Knee/Hip Exercises: Standing   Hip ADduction Strengthening;Both;2 sets;15 reps   Lateral Step Up 10 reps;Step Height: 6";3 sets;Right   Forward Step Up 10 reps;Hand Hold: 2;Step Height: 6";3 sets;Right   Rocker Board 3 minutes   Ultrasound   Ultrasound Location left shoulder   Ultrasound Parameters 1.2 w/cm 50% x 10 min   Ultrasound Goals Pain                PT Education - 02/13/15 1211    Education provided Yes   Education Details Pt. instructed to continue current home exercise program.   Person(s) Educated Patient   Methods Explanation   Comprehension Verbalized understanding          PT Short Term Goals - 01/30/15 1203    PT SHORT TERM GOAL #1   Title I  with initial HEP   Time 2   Period Weeks   Status Achieved   PT SHORT TERM GOAL #2   Title Decreased pain in left shoulder by 30% (01/31/15)   Time 4   Period Weeks   Status On-going  20%   PT SHORT TERM GOAL #3   Title able to undress with 50% greater ease (01/31/15)   Time 4   Status On-going  20%   PT SHORT TERM GOAL #4   Title no incidence of falls in 3 weeks (01/31/15)   Time 4   Period Weeks   Status Achieved           PT Long Term Goals - 01/16/15 1219    PT LONG TERM GOAL #1   Title  I with advanced HEP   Time 8   Period Weeks   Status On-going   PT LONG TERM GOAL #2   Title demo improved left shoulder strength to 4+/5 to improve function   Time 8   Period Weeks   Status On-going   PT LONG TERM GOAL #3   Title improved left shoulder motion to be able to drive and undress   Time 8   Period Weeks   Status On-going   PT LONG TERM GOAL #4   Title demo 4+/5  right hip strength to improve function   Time 8   Period Weeks   Status On-going   PT LONG TERM GOAL #5   Title improve FOTO to 40%   Time 8   Period Weeks   Status On-going               Plan - 02/13/15 1213    Clinical Impression Statement Pt. is tolerating treatment well-she is progressing well with hip exercises.  Pt. continues to experience left shoulder pain and limitation.   Pt will benefit from skilled therapeutic intervention in order to improve on the following deficits Decreased range of motion;Difficulty walking;Impaired UE functional use;Pain;Decreased balance;Impaired flexibility;Decreased strength;Postural dysfunction   Rehab Potential Good   PT Frequency 2x / week   PT Duration 8 weeks   PT Treatment/Interventions ADLs/Self Care Home Management;Cryotherapy;Electrical Stimulation;Iontophoresis 4mg /ml Dexamethasone;Moist Heat;Ultrasound;Therapeutic exercise;Balance training;Neuromuscular re-education;Patient/family education;Passive range of motion;Manual techniques;Taping;Vasopneumatic Device   PT Next Visit Plan Continue per PT POC regarding LT shoulder, balance, [place] and PROM to LT shldr   Consulted and Agree with Plan of Care Patient        Problem List Patient Active Problem List   Diagnosis Date Noted  . Impingement syndrome of left shoulder region 12/08/2014  . Osteoporosis 09/20/2014  . Hip fracture requiring operative repair 02/22/2014  . Hypothyroidism 11/29/2013  . Depression 11/29/2013  . GERD (gastroesophageal reflux disease) 11/29/2013  . Hypertension 11/29/2013  . Hyperlipidemia with target LDL less than 100 11/29/2013  . Thyroid cyst 04/26/2013  . MALIGNANT MELANOMA, SKIN 08/07/2010  . DYSPNEA 08/07/2010  . [name] 08/06/2010  . SCLERODERMA 08/06/2010  . OSTEOARTHRITIS 08/06/2010    Cherlyn Cushing PT 02/13/2015, 12:32 PM  [facility] [address] [place], [place],  [address] Phone: [phone]   Fax:  [phone]

## 2015-02-15 ENCOUNTER — Encounter: Payer: Self-pay | Admitting: Physical Therapy

## 2015-02-15 ENCOUNTER — Ambulatory Visit: Payer: Medicare Other | Admitting: Physical Therapy

## 2015-02-15 DIAGNOSIS — M25512 Pain in left shoulder: Secondary | ICD-10-CM | POA: Diagnosis not present

## 2015-02-15 DIAGNOSIS — R531 Weakness: Secondary | ICD-10-CM

## 2015-02-15 DIAGNOSIS — M79605 Pain in left leg: Secondary | ICD-10-CM

## 2015-02-15 DIAGNOSIS — M25612 Stiffness of left shoulder, not elsewhere classified: Secondary | ICD-10-CM | POA: Diagnosis not present

## 2015-02-15 DIAGNOSIS — R2681 Unsteadiness on feet: Secondary | ICD-10-CM

## 2015-02-15 NOTE — Therapy (Signed)
[facility] [place], [place], [address] Phone: [phone]   Fax:  [phone]  Physical Therapy Treatment  Patient Details  Name: Haley Trevino MRN: [id number] Date of Birth: Nov 21, 1940 Referring Provider:  [name], *  Encounter Date: 02/15/2015      PT End of Session - 02/15/15 1148    Visit Number 11   Number of Visits 16   Date for PT Re-Evaluation 02/28/15   PT Start Time 1114   PT Stop Time 1158   PT Time Calculation (min) 44 min   Activity Tolerance Patient tolerated treatment well   Behavior During Therapy [facility] for tasks assessed/performed      Past Medical History  Diagnosis Date  . Raynaud disease   . Scleroderma   . Osteoarthritis   . Dyspnea   . Thyroid disease     hypothyroidism  . Cataract     Past Surgical History  Procedure Laterality Date  . Cholecystectomy  1973  . Total abdominal hysterectomy  1974  . Breast enhancement surgery  1975  . Hand surgery  08/2012; 11/2012  . Melanoma excision      at 40 yrs of age  . Orif hip fracture Right 02/22/2014    Procedure: OPEN REDUCTION INTERNAL FIXATION RIGHT HIP;  Surgeon: Sanjuana Kava, MD;  Location: [facility];  Service: Orthopedics;  Laterality: Right;    There were no vitals filed for this visit.  Visit Diagnosis:  Left shoulder pain  Stiffness of left shoulder joint  Generalized weakness  Unsteadiness  Leg pain, left      Subjective Assessment - 02/15/15 1132    Subjective Left shoulder pain; right hip pain is much better.   Limitations House hold activities   Diagnostic tests xray   Patient Stated Goals want to be able to use arm like she used to for ADLs including doffing shirt, bra   Currently in Pain? Yes   Pain Score 4    Pain Location Shoulder   Pain Orientation Left   Pain Descriptors / Indicators Sore   Pain Onset More than a month ago   Pain Frequency Intermittent   Aggravating Factors  increased  activity   Pain Relieving Factors rest   Multiple Pain Sites Yes   Pain Score 3   Pain Location Hip   Pain Orientation Right   Pain Descriptors / Indicators Aching   Pain Type Chronic pain   Pain Onset More than a month ago   Aggravating Factors  prolong activity   Pain Relieving Factors rest            [facility] PT Assessment - 02/15/15 0001    Strength   Overall Strength Deficits;Within functional limits for tasks performed   Right/Left Shoulder Left   Left Shoulder Flexion 4+/5   Left Shoulder Extension 4+/5   Left Shoulder ABduction 4/5   Left Shoulder Internal Rotation 4+/5   Left Shoulder External Rotation 4-/5   Right/Left Hip Right   Right Hip ABduction 4+/5   Right Hip ADduction --  4 to 4+/5                     [facility] Adult PT Treatment/Exercise - 02/15/15 0001    Knee/Hip Exercises: Aerobic   Nustep L5 x22mn   Knee/Hip Exercises: Standing   Hip ADduction Strengthening;Both;2 sets;15 reps  red t-band   Lateral Step Up 10 reps;Step Height: 6";3 sets;Right   Forward Step Up 10 reps;Hand Hold:  2;Step Height: 6";3 sets;Right   Rocker Board 3 minutes   Shoulder Exercises: Standing   Other Standing Exercises RW4 yellow t-band 3x10 each   Other Standing Exercises ER with UE support 2# 3x10   Shoulder Exercises: Pulleys   Flexion --  67mn                  PT Short Term Goals - 01/30/15 1203    PT SHORT TERM GOAL #1   Title I with initial HEP   Time 2   Period Weeks   Status Achieved   PT SHORT TERM GOAL #2   Title Decreased pain in left shoulder by 30% (01/31/15)   Time 4   Period Weeks   Status On-going  20%   PT SHORT TERM GOAL #3   Title able to undress with 50% greater ease (01/31/15)   Time 4   Status On-going  20%   PT SHORT TERM GOAL #4   Title no incidence of falls in 3 weeks (01/31/15)   Time 4   Period Weeks   Status Achieved           PT Long Term Goals - 02/15/15 1140    PT LONG TERM GOAL #1   Title  I with  advanced HEP   Time 8   Period Weeks   Status On-going   PT LONG TERM GOAL #2   Title demo improved left shoulder strength to 4+/5 to improve function   Time 8   Period Weeks   Status On-going   PT LONG TERM GOAL #3   Title improved left shoulder motion to be able to drive and undress   Time 8   Period Weeks   Status Achieved   PT LONG TERM GOAL #4   Title demo 4+/5 right hip strength to improve function   Time 8   Period Weeks   Status Achieved  4 to 4+/5   PT LONG TERM GOAL #5   Title improve FOTO to 40%   Time 8   Period Weeks   Status On-going               Plan - 02/15/15 1149    Clinical Impression Statement Patient is progressing with all activities today. Improved strength for left shoulder and right hip. Patient reports no improvement with STG's today. LTG # 3 and #4 met. Patient is able to drive and dress independently.  Pain limitations are main complaint.    Pt will benefit from skilled therapeutic intervention in order to improve on the following deficits Decreased range of motion;Difficulty walking;Impaired UE functional use;Pain;Decreased balance;Impaired flexibility;Decreased strength;Postural dysfunction   Rehab Potential Good   PT Frequency 2x / week   PT Duration 8 weeks   PT Treatment/Interventions ADLs/Self Care Home Management;Cryotherapy;Electrical Stimulation;Iontophoresis 422mml Dexamethasone;Moist Heat;Ultrasound;Therapeutic exercise;Balance training;Neuromuscular re-education;Patient/family education;Passive range of motion;Manual techniques;Taping;Vasopneumatic Device   PT Next Visit Plan cont with poc   Consulted and Agree with Plan of Care Patient        Problem List Patient Active Problem List   Diagnosis Date Noted  . Impingement syndrome of left shoulder region 12/08/2014  . Osteoporosis 09/20/2014  . Hip fracture requiring operative repair 02/22/2014  . Hypothyroidism 11/29/2013  . Depression 11/29/2013  . GERD  (gastroesophageal reflux disease) 11/29/2013  . Hypertension 11/29/2013  . Hyperlipidemia with target LDL less than 100 11/29/2013  . Thyroid cyst 04/26/2013  . MALIGNANT MELANOMA, SKIN 08/07/2010  . DYSPNEA 08/07/2010  . RAYNAUD'S  DISEASE 08/06/2010  . SCLERODERMA 08/06/2010  . OSTEOARTHRITIS 08/06/2010    DUNFORD, CHRISTINA P, PTA 02/15/2015, 11:59 AM  [facility] [address] [place], [place], [address] Phone: [phone]   Fax:  [phone]

## 2015-02-20 ENCOUNTER — Ambulatory Visit: Payer: Medicare Other | Admitting: Physical Therapy

## 2015-02-20 ENCOUNTER — Encounter: Payer: Self-pay | Admitting: Physical Therapy

## 2015-02-20 DIAGNOSIS — M79605 Pain in left leg: Secondary | ICD-10-CM | POA: Diagnosis not present

## 2015-02-20 DIAGNOSIS — R531 Weakness: Secondary | ICD-10-CM | POA: Diagnosis not present

## 2015-02-20 DIAGNOSIS — M25512 Pain in left shoulder: Secondary | ICD-10-CM | POA: Diagnosis not present

## 2015-02-20 DIAGNOSIS — M25612 Stiffness of left shoulder, not elsewhere classified: Secondary | ICD-10-CM | POA: Diagnosis not present

## 2015-02-20 DIAGNOSIS — R2681 Unsteadiness on feet: Secondary | ICD-10-CM | POA: Diagnosis not present

## 2015-02-20 NOTE — Therapy (Signed)
[facility] [place], [place], [address] Phone: [phone]   Fax:  [phone]  Physical Therapy Treatment  Patient Details  Name: Haley Trevino MRN: [id number] Date of Birth: 06-16-1941 Referring Provider:  [name], *  Encounter Date: 02/20/2015      PT End of Session - 02/20/15 1151    Visit Number 12   Number of Visits 16   Date for PT Re-Evaluation 02/28/15   PT Start Time 1113   PT Stop Time 1154   PT Time Calculation (min) 41 min   Activity Tolerance Patient tolerated treatment well   Behavior During Therapy [facility] for tasks assessed/performed      Past Medical History  Diagnosis Date  . Raynaud disease   . Scleroderma   . Osteoarthritis   . Dyspnea   . Thyroid disease     hypothyroidism  . Cataract     Past Surgical History  Procedure Laterality Date  . Cholecystectomy  1973  . Total abdominal hysterectomy  1974  . Breast enhancement surgery  1975  . Hand surgery  08/2012; 11/2012  . Melanoma excision      at 35 yrs of age  . Orif hip fracture Right 02/22/2014    Procedure: OPEN REDUCTION INTERNAL FIXATION RIGHT HIP;  Surgeon: Sanjuana Kava, MD;  Location: [facility];  Service: Orthopedics;  Laterality: Right;    There were no vitals filed for this visit.  Visit Diagnosis:  Left shoulder pain  Stiffness of left shoulder joint  Generalized weakness  Unsteadiness  Leg pain, left      Subjective Assessment - 02/20/15 1119    Subjective Left shoulder better today right hip pain is same.   Limitations House hold activities   Patient Stated Goals want to be able to use arm like she used to for ADLs including doffing shirt, bra   Currently in Pain? Yes   Pain Score 3    Pain Location Shoulder   Pain Orientation Left   Pain Descriptors / Indicators Sore   Pain Onset More than a month ago   Pain Frequency Intermittent   Aggravating Factors  activity   Pain Relieving Factors rest    Pain Score 3   Pain Location Hip   Pain Orientation Right   Pain Descriptors / Indicators Aching;Sore   Pain Type Chronic pain   Pain Onset More than a month ago   Pain Frequency Constant   Aggravating Factors  activity   Pain Relieving Factors rest                         [facility] Adult PT Treatment/Exercise - 02/20/15 0001    Knee/Hip Exercises: Aerobic   Nustep L5 x26mn   Knee/Hip Exercises: Standing   Hip ADduction Strengthening;Both;2 sets;15 reps   Lateral Step Up 10 reps;Step Height: 6";3 sets;Right   Forward Step Up 10 reps;Hand Hold: 2;Step Height: 6";3 sets;Right   Rocker Board 3 minutes   Shoulder Exercises: Standing   Other Standing Exercises RW4 yellow t-band 3x10 each   Other Standing Exercises ER with UE support 2# 3x10   Shoulder Exercises: ROM/Strengthening   Other ROM/Strengthening Exercises scaption 2# 2x10                  PT Short Term Goals - 01/30/15 1203    PT SHORT TERM GOAL #1   Title I with initial HEP   Time 2   Period Weeks  Status Achieved   PT SHORT TERM GOAL #2   Title Decreased pain in left shoulder by 30% (01/31/15)   Time 4   Period Weeks   Status On-going  20%   PT SHORT TERM GOAL #3   Title able to undress with 50% greater ease (01/31/15)   Time 4   Status On-going  20%   PT SHORT TERM GOAL #4   Title no incidence of falls in 3 weeks (01/31/15)   Time 4   Period Weeks   Status Achieved           PT Long Term Goals - 02/15/15 1140    PT LONG TERM GOAL #1   Title  I with advanced HEP   Time 8   Period Weeks   Status On-going   PT LONG TERM GOAL #2   Title demo improved left shoulder strength to 4+/5 to improve function   Time 8   Period Weeks   Status On-going   PT LONG TERM GOAL #3   Title improved left shoulder motion to be able to drive and undress   Time 8   Period Weeks   Status Achieved   PT LONG TERM GOAL #4   Title demo 4+/5 right hip strength to improve function   Time 8    Period Weeks   Status Achieved  4 to 4+/5   PT LONG TERM GOAL #5   Title improve FOTO to 40%   Time 8   Period Weeks   Status On-going               Plan - 02/20/15 1152    Clinical Impression Statement Patient progressing with reports of improvement in left shoulder today yet right hip feels the same. Pateint tolerated all exercises with no reported pain increase. No further goals met today due to limitations with pain per patient.    Pt will benefit from skilled therapeutic intervention in order to improve on the following deficits Decreased range of motion;Difficulty walking;Impaired UE functional use;Pain;Decreased balance;Impaired flexibility;Decreased strength;Postural dysfunction   Rehab Potential Good   PT Frequency 2x / week   PT Duration 8 weeks   PT Next Visit Plan cont with poc   Consulted and Agree with Plan of Care Patient        Problem List Patient Active Problem List   Diagnosis Date Noted  . Impingement syndrome of left shoulder region 12/08/2014  . Osteoporosis 09/20/2014  . Hip fracture requiring operative repair 02/22/2014  . Hypothyroidism 11/29/2013  . Depression 11/29/2013  . GERD (gastroesophageal reflux disease) 11/29/2013  . Hypertension 11/29/2013  . Hyperlipidemia with target LDL less than 100 11/29/2013  . Thyroid cyst 04/26/2013  . MALIGNANT MELANOMA, SKIN 08/07/2010  . DYSPNEA 08/07/2010  . [name] 08/06/2010  . SCLERODERMA 08/06/2010  . OSTEOARTHRITIS 08/06/2010    Letha Mirabal P, PTA 02/20/2015, 11:57 AM  [facility] [address] [place], [place], [address] Phone: [phone]   Fax:  [phone]

## 2015-02-22 ENCOUNTER — Ambulatory Visit: Payer: Medicare Other | Admitting: Physical Therapy

## 2015-02-22 ENCOUNTER — Encounter: Payer: Self-pay | Admitting: Physical Therapy

## 2015-02-22 DIAGNOSIS — M25512 Pain in left shoulder: Secondary | ICD-10-CM

## 2015-02-22 DIAGNOSIS — R2681 Unsteadiness on feet: Secondary | ICD-10-CM

## 2015-02-22 DIAGNOSIS — R531 Weakness: Secondary | ICD-10-CM | POA: Diagnosis not present

## 2015-02-22 DIAGNOSIS — M25612 Stiffness of left shoulder, not elsewhere classified: Secondary | ICD-10-CM | POA: Diagnosis not present

## 2015-02-22 DIAGNOSIS — M79605 Pain in left leg: Secondary | ICD-10-CM | POA: Diagnosis not present

## 2015-02-22 NOTE — Therapy (Signed)
[facility] [place], [place], [address] Phone: [phone]   Fax:  [phone]  Physical Therapy Treatment  Patient Details  Name: Haley Trevino MRN: [id number] Date of Birth: [birth year] Referring Provider:  [name], *  Encounter Date: 02/22/2015      PT End of Session - 02/22/15 1122    Visit Number 13   Number of Visits 16   Date for PT Re-Evaluation 02/28/15   PT Start Time 1114   PT Stop Time 1155   PT Time Calculation (min) 41 min   Activity Tolerance Patient tolerated treatment well   Behavior During Therapy [facility] for tasks assessed/performed      Past Medical History  Diagnosis Date  . Raynaud disease   . Scleroderma   . Osteoarthritis   . Dyspnea   . Thyroid disease     hypothyroidism  . Cataract     Past Surgical History  Procedure Laterality Date  . Cholecystectomy  1973  . Total abdominal hysterectomy  1974  . Breast enhancement surgery  1975  . Hand surgery  08/2012; 11/2012  . Melanoma excision      at 14 yrs of age  . Orif hip fracture Right 02/22/2014    Procedure: OPEN REDUCTION INTERNAL FIXATION RIGHT HIP;  Surgeon: Sanjuana Kava, MD;  Location: [facility];  Service: Orthopedics;  Laterality: Right;    There were no vitals filed for this visit.  Visit Diagnosis:  Left shoulder pain  Stiffness of left shoulder joint  Generalized weakness  Unsteadiness  Leg pain, left      Subjective Assessment - 02/22/15 1120    Subjective no complaints after last treatment   Limitations House hold activities   Diagnostic tests xray   Patient Stated Goals want to be able to use arm like she used to for ADLs including doffing shirt, bra   Currently in Pain? Yes   Pain Score 3    Pain Location Shoulder   Pain Orientation Left   Pain Descriptors / Indicators Sore   Pain Onset More than a month ago   Pain Frequency Intermittent   Aggravating Factors  prolong activity   Pain  Relieving Factors rest   Pain Score 3   Pain Location Hip   Pain Orientation Right   Pain Descriptors / Indicators Aching;Sore   Pain Type Chronic pain   Pain Onset More than a month ago   Aggravating Factors  prolong activity   Pain Relieving Factors rest                         [facility] Adult PT Treatment/Exercise - 02/22/15 0001    Knee/Hip Exercises: Aerobic   Nustep L5 x27min   Knee/Hip Exercises: Standing   Hip ADduction Strengthening;Both;2 sets;15 reps  red t-band   Lateral Step Up 10 reps;Step Height: 6";3 sets;Right   Forward Step Up 10 reps;Hand Hold: 2;Step Height: 6";3 sets;Right   Rocker Board 3 minutes   Shoulder Exercises: Standing   Other Standing Exercises RW4 yellow t-band 3x10 each   Other Standing Exercises ER with UE support 2# 3x10   Shoulder Exercises: ROM/Strengthening   Other ROM/Strengthening Exercises scaption 2# 2x10                  PT Short Term Goals - 01/30/15 1203    PT SHORT TERM GOAL #1   Title I with initial HEP   Time 2   Period  Weeks   Status Achieved   PT SHORT TERM GOAL #2   Title Decreased pain in left shoulder by 30% (01/31/15)   Time 4   Period Weeks   Status On-going  20%   PT SHORT TERM GOAL #3   Title able to undress with 50% greater ease (01/31/15)   Time 4   Status On-going  20%   PT SHORT TERM GOAL #4   Title no incidence of falls in 3 weeks (01/31/15)   Time 4   Period Weeks   Status Achieved           PT Long Term Goals - 02/15/15 1140    PT LONG TERM GOAL #1   Title  I with advanced HEP   Time 8   Period Weeks   Status On-going   PT LONG TERM GOAL #2   Title demo improved left shoulder strength to 4+/5 to improve function   Time 8   Period Weeks   Status On-going   PT LONG TERM GOAL #3   Title improved left shoulder motion to be able to drive and undress   Time 8   Period Weeks   Status Achieved   PT LONG TERM GOAL #4   Title demo 4+/5 right hip strength to improve  function   Time 8   Period Weeks   Status Achieved  4 to 4+/5   PT LONG TERM GOAL #5   Title improve FOTO to 40%   Time 8   Period Weeks   Status On-going               Plan - 02/22/15 1123    Clinical Impression Statement Patient progressing with reports of improvement in left shoulder and right hip overall. Patient reports not more than 20% improvement yet has decreased pain from first visit. Patient continues to tolerate all exercises with no pain reports. Unable to meet any further goals due to pain limitations reported.   Pt will benefit from skilled therapeutic intervention in order to improve on the following deficits Decreased range of motion;Difficulty walking;Impaired UE functional use;Pain;Decreased balance;Impaired flexibility;Decreased strength;Postural dysfunction   Rehab Potential Good   PT Frequency 2x / week   PT Duration 8 weeks   PT Treatment/Interventions ADLs/Self Care Home Management;Cryotherapy;Electrical Stimulation;Iontophoresis 4mg /ml Dexamethasone;Moist Heat;Ultrasound;Therapeutic exercise;Balance training;Neuromuscular re-education;Patient/family education;Passive range of motion;Manual techniques;Taping;Vasopneumatic Device   PT Next Visit Plan cont with poc   Consulted and Agree with Plan of Care Patient        Problem List Patient Active Problem List   Diagnosis Date Noted  . Impingement syndrome of left shoulder region 12/08/2014  . Osteoporosis 09/20/2014  . Hip fracture requiring operative repair 02/22/2014  . Hypothyroidism 11/29/2013  . Depression 11/29/2013  . GERD (gastroesophageal reflux disease) 11/29/2013  . Hypertension 11/29/2013  . Hyperlipidemia with target LDL less than 100 11/29/2013  . Thyroid cyst 04/26/2013  . MALIGNANT MELANOMA, SKIN 08/07/2010  . DYSPNEA 08/07/2010  . [name] 08/06/2010  . SCLERODERMA 08/06/2010  . OSTEOARTHRITIS 08/06/2010    DUNFORD, CHRISTINA P, PTA 02/22/2015, 11:57 AM  [facility] [address] [place], [place], [address] Phone: [phone]   Fax:  [phone]

## 2015-02-27 ENCOUNTER — Other Ambulatory Visit: Payer: Self-pay | Admitting: Family

## 2015-02-27 ENCOUNTER — Ambulatory Visit: Payer: Medicare Other | Admitting: Physical Therapy

## 2015-02-27 ENCOUNTER — Encounter: Payer: Self-pay | Admitting: Physical Therapy

## 2015-02-27 DIAGNOSIS — M79605 Pain in left leg: Secondary | ICD-10-CM | POA: Diagnosis not present

## 2015-02-27 DIAGNOSIS — M25512 Pain in left shoulder: Secondary | ICD-10-CM | POA: Diagnosis not present

## 2015-02-27 DIAGNOSIS — M25612 Stiffness of left shoulder, not elsewhere classified: Secondary | ICD-10-CM

## 2015-02-27 DIAGNOSIS — R531 Weakness: Secondary | ICD-10-CM | POA: Diagnosis not present

## 2015-02-27 DIAGNOSIS — R2681 Unsteadiness on feet: Secondary | ICD-10-CM | POA: Diagnosis not present

## 2015-02-27 NOTE — Therapy (Signed)
[facility] [place], [place], [address] Phone: [phone]   Fax:  [phone]  Physical Therapy Treatment  Patient Details  Name: Haley Trevino MRN: [id number] Date of Birth: 03-31-1941 Referring Provider:  [name], *  Encounter Date: 02/27/2015      PT End of Session - 02/27/15 1132    Visit Number 14   Number of Visits 16   Date for PT Re-Evaluation 02/28/15   PT Start Time 1115   PT Stop Time 1159   PT Time Calculation (min) 44 min   Activity Tolerance Patient tolerated treatment well   Behavior During Therapy [facility] for tasks assessed/performed      Past Medical History  Diagnosis Date  . Raynaud disease   . Scleroderma   . Osteoarthritis   . Dyspnea   . Thyroid disease     hypothyroidism  . Cataract     Past Surgical History  Procedure Laterality Date  . Cholecystectomy  1973  . Total abdominal hysterectomy  1974  . Breast enhancement surgery  1975  . Hand surgery  08/2012; 11/2012  . Melanoma excision      at 74 yrs of age  . Orif hip fracture Right 02/22/2014    Procedure: OPEN REDUCTION INTERNAL FIXATION RIGHT HIP;  Surgeon: Sanjuana Kava, MD;  Location: [facility];  Service: Orthopedics;  Laterality: Right;    There were no vitals filed for this visit.  Visit Diagnosis:  Left shoulder pain  Stiffness of left shoulder joint  Generalized weakness  Unsteadiness  Leg pain, left      Subjective Assessment - 02/27/15 1126    Subjective no complaints after last treatment   Limitations House hold activities   Diagnostic tests xray   Patient Stated Goals want to be able to use arm like she used to for ADLs including doffing shirt, bra   Currently in Pain? Yes   Pain Score 3    Pain Location Shoulder   Pain Orientation Left   Pain Descriptors / Indicators Sore   Pain Onset More than a month ago   Pain Frequency Intermittent   Aggravating Factors  prolong activity   Pain  Relieving Factors rest   Multiple Pain Sites Yes   Pain Score 3   Pain Location Hip   Pain Orientation Right   Pain Descriptors / Indicators Aching;Sore   Pain Type Chronic pain   Pain Onset More than a month ago   Pain Frequency Constant   Aggravating Factors  prolong activity   Pain Relieving Factors rest                         [facility] Adult PT Treatment/Exercise - 02/27/15 0001    Knee/Hip Exercises: Aerobic   Nustep L5 x83mn   Knee/Hip Exercises: Standing   Lateral Step Up 10 reps;Step Height: 6";3 sets;Right   Forward Step Up 10 reps;Hand Hold: 2;Step Height: 6";3 sets;Right   Rocker Board 3 minutes   Shoulder Exercises: Standing   Other Standing Exercises RW4 yellow t-band 3x10 each   Other Standing Exercises ER with UE support 2# 3x10   Shoulder Exercises: ROM/Strengthening   Other ROM/Strengthening Exercises scaption 2# 2x10                  PT Short Term Goals - 02/27/15 1137    PT SHORT TERM GOAL #1   Title I with initial HEP   Time 2  Period Weeks   Status Achieved   PT SHORT TERM GOAL #2   Title Decreased pain in left shoulder by 30% (01/31/15)   Time 4   Period Weeks   Status Achieved  50%   PT SHORT TERM GOAL #3   Title able to undress with 50% greater ease (01/31/15)   Time 4   Period Weeks   Status On-going   PT SHORT TERM GOAL #4   Title no incidence of falls in 3 weeks (01/31/15)   Time 4   Period Weeks   Status Achieved           PT Long Term Goals - 02/27/15 1151    PT LONG TERM GOAL #1   Title  I with advanced HEP   Time 8   Period Weeks   Status Achieved   PT LONG TERM GOAL #2   Title demo improved left shoulder strength to 4+/5 to improve function   Time 8   Period Weeks   Status On-going   PT LONG TERM GOAL #3   Title improved left shoulder motion to be able to drive and undress   Time 8   Period Weeks   Status Achieved   PT LONG TERM GOAL #4   Title demo 4+/5 right hip strength to improve  function   Time 8   Period Weeks   Status Achieved   PT LONG TERM GOAL #5   Title improve FOTO to 40%   Time 8   Period Weeks   Status On-going               Plan - 02/27/15 1133    Clinical Impression Statement Patient progressing with improvement of left shoulder and right hip. Patient report 50% improvement in left shoulder and 20% improvement in right hip. Patient has reported no pain increase with exercises. Met STG #2 and LTG #1 other goals ongoing due to pain deficits   Pt will benefit from skilled therapeutic intervention in order to improve on the following deficits Decreased range of motion;Difficulty walking;Impaired UE functional use;Pain;Decreased balance;Impaired flexibility;Decreased strength;Postural dysfunction   Rehab Potential Good   PT Frequency 2x / week   PT Duration 8 weeks   PT Treatment/Interventions ADLs/Self Care Home Management;Cryotherapy;Electrical Stimulation;Iontophoresis 33m/ml Dexamethasone;Moist Heat;Ultrasound;Therapeutic exercise;Balance training;Neuromuscular re-education;Patient/family education;Passive range of motion;Manual techniques;Taping;Vasopneumatic Device   PT Next Visit Plan cont with poc / next visit DC per patient request   Consulted and Agree with Plan of Care Patient        Problem List Patient Active Problem List   Diagnosis Date Noted  . Impingement syndrome of left shoulder region 12/08/2014  . Osteoporosis 09/20/2014  . Hip fracture requiring operative repair 02/22/2014  . Hypothyroidism 11/29/2013  . Depression 11/29/2013  . GERD (gastroesophageal reflux disease) 11/29/2013  . Hypertension 11/29/2013  . Hyperlipidemia with target LDL less than 100 11/29/2013  . Thyroid cyst 04/26/2013  . MALIGNANT MELANOMA, SKIN 08/07/2010  . DYSPNEA 08/07/2010  . [name] 08/06/2010  . SCLERODERMA 08/06/2010  . OSTEOARTHRITIS 08/06/2010    Shantana Christon P, PTA 02/27/2015, 11:59 AM  [facility] [address]Phone: [phone]  Fax:  [phone]

## 2015-03-01 ENCOUNTER — Ambulatory Visit: Payer: Medicare Other | Admitting: Physical Therapy

## 2015-03-01 DIAGNOSIS — R531 Weakness: Secondary | ICD-10-CM

## 2015-03-01 DIAGNOSIS — M79605 Pain in left leg: Secondary | ICD-10-CM | POA: Diagnosis not present

## 2015-03-01 DIAGNOSIS — M25612 Stiffness of left shoulder, not elsewhere classified: Secondary | ICD-10-CM

## 2015-03-01 DIAGNOSIS — M25512 Pain in left shoulder: Secondary | ICD-10-CM | POA: Diagnosis not present

## 2015-03-01 DIAGNOSIS — R2681 Unsteadiness on feet: Secondary | ICD-10-CM | POA: Diagnosis not present

## 2015-03-01 NOTE — Therapy (Signed)
[facility] [place], [place], [address] Phone: [phone]   Fax:  [phone]  Physical Therapy Treatment  Patient Details  Name: Haley Trevino MRN: [id number] Date of Birth: 1940-09-23 Referring Provider:  [name], *  Encounter Date: 03/01/2015      PT End of Session - 03/01/15 1228    Visit Number 15   Number of Visits 16   Date for PT Re-Evaluation 02/28/15   PT Start Time 1232   PT Stop Time 1315   PT Time Calculation (min) 43 min   Activity Tolerance Patient tolerated treatment well   Behavior During Therapy [facility] for tasks assessed/performed      Past Medical History  Diagnosis Date  . Raynaud disease   . Scleroderma   . Osteoarthritis   . Dyspnea   . Thyroid disease     hypothyroidism  . Cataract     Past Surgical History  Procedure Laterality Date  . Cholecystectomy  1973  . Total abdominal hysterectomy  1974  . Breast enhancement surgery  1975  . Hand surgery  08/2012; 11/2012  . Melanoma excision      at 76 yrs of age  . Orif hip fracture Right 02/22/2014    Procedure: OPEN REDUCTION INTERNAL FIXATION RIGHT HIP;  Surgeon: Sanjuana Kava, MD;  Location: [facility];  Service: Orthopedics;  Laterality: Right;    There were no vitals filed for this visit.  Visit Diagnosis:  Stiffness of left shoulder joint  Generalized weakness  Unsteadiness      Subjective Assessment - 03/01/15 1232    Subjective My leg is still sore, but I haven't fell in a couple of weeks. My shoullder is about 50% better. I still have pain with horizontal ABD and ext combined.   Currently in Pain? Yes   Pain Score 1    Pain Location Shoulder   Pain Orientation Left   Pain Descriptors / Indicators Sore            [facility] PT Assessment - 03/01/15 0001    Strength   Left Shoulder Flexion 4+/5   Left Shoulder Extension 5/5   Left Shoulder ABduction 4/5   Left Shoulder Internal Rotation 4+/5   Left  Shoulder External Rotation 4+/5                     [facility] Adult PT Treatment/Exercise - 03/01/15 0001    Knee/Hip Exercises: Standing   Lateral Step Up 3 sets;10 reps;Step Height: 6"   Forward Step Up 10 reps;Hand Hold: 2;Step Height: 6";3 sets;Right   SLS 3 mins   Shoulder Exercises: Sidelying   External Rotation Strengthening;Left;20 reps   External Rotation Weight (lbs) 2   Shoulder Exercises: Standing   Flexion Strengthening;Left;20 reps;Weights   Flexion Limitations 2   ABduction Strengthening;Left;20 reps;Weights   Shoulder ABduction Weight (lbs) 1   Other Standing Exercises RW4 yellow t-band 3x10 each   Other Standing Exercises ER with UE support 2# 3x10; Scaption 2# x 20   Shoulder Exercises: Stretch   Other Shoulder Stretches Reviewed various ER stretches: supine, doorway and on verticle roller                  PT Short Term Goals - 03/01/15 1235    PT SHORT TERM GOAL #1   Title I with initial HEP   Time 2   Period Weeks   Status Achieved   PT SHORT TERM GOAL #2  Title Decreased pain in left shoulder by 30% (01/31/15)   Time 4   Period Weeks   Status Achieved   PT SHORT TERM GOAL #3   Title able to undress with 50% greater ease (01/31/15)   Time 4   Period Weeks   Status Achieved           PT Long Term Goals - 2015/03/30 1236    PT LONG TERM GOAL #1   Title  I with advanced HEP   Time 8   Period Weeks   Status Achieved   PT LONG TERM GOAL #2   Title demo improved left shoulder strength to 4+/5 to improve function   Time 8   Period Weeks   Status Partially Met   PT LONG TERM GOAL #3   Title improved left shoulder motion to be able to drive and undress   Time 8   Period Weeks   Status Achieved   PT LONG TERM GOAL #4   Title demo 4+/5 right hip strength to improve function   Time 8   Period Weeks   Status Achieved   PT LONG TERM GOAL #5   Title improve FOTO to 40%  44% from 57% limited   Time 8   Period Weeks   Status  Partially Met               Plan - 2015-03-30 1329    Clinical Impression Statement Patient has met 3 of 5 LTGs. She has partially met her strength goals, but still has weakness with left shoulder ABD and flexion. She is comfortable with her HEP to continue strengthening. Patient to be discharged today as she is pleased with her current functional level.   PT Next Visit Plan see d/c summary          G-Codes - 2015-03-30 1333    Functional Assessment Tool Used Cinical Judgement   Functional Limitation Other PT primary   Other PT Primary Goal Status ([id number]) At least 40 percent but less than 60 percent impaired, limited or restricted   Other PT Primary Discharge Status [phone]) At least 40 percent but less than 60 percent impaired, limited or restricted      Problem List Patient Active Problem List   Diagnosis Date Noted  . Impingement syndrome of left shoulder region 12/08/2014  . Osteoporosis 09/20/2014  . Hip fracture requiring operative repair 02/22/2014  . Hypothyroidism 11/29/2013  . Depression 11/29/2013  . GERD (gastroesophageal reflux disease) 11/29/2013  . Hypertension 11/29/2013  . Hyperlipidemia with target LDL less than 100 11/29/2013  . Thyroid cyst 04/26/2013  . MALIGNANT MELANOMA, SKIN 08/07/2010  . DYSPNEA 08/07/2010  . [name] 08/06/2010  . SCLERODERMA 08/06/2010  . OSTEOARTHRITIS 08/06/2010    Madelyn Flavors PT  30-Mar-2015, 1:35 PM  [facility] [address] [place], [place], [address] Phone: [phone]   Fax:  [phone]   PHYSICAL THERAPY DISCHARGE SUMMARY  Visits from Start of Care: 15  Current functional level related to goals / functional outcomes: See above   Remaining deficits: See above   Education / Equipment:HEP Plan: Patient agrees to discharge.  Patient goals were partially met. Patient is being discharged due to being pleased with the current functional level.  ?????         Madelyn Flavors, PT 2015-03-30 1:37 PM [facility] [place], [place], [address] Phone: [phone]   Fax:  [phone]

## 2015-03-23 DIAGNOSIS — M7711 Lateral epicondylitis, right elbow: Secondary | ICD-10-CM | POA: Diagnosis not present

## 2015-03-23 DIAGNOSIS — M25512 Pain in left shoulder: Secondary | ICD-10-CM | POA: Diagnosis not present

## 2015-04-04 ENCOUNTER — Other Ambulatory Visit: Payer: Self-pay | Admitting: Pharmacist

## 2015-04-05 DIAGNOSIS — M76891 Other specified enthesopathies of right lower limb, excluding foot: Secondary | ICD-10-CM | POA: Diagnosis not present

## 2015-04-05 DIAGNOSIS — Z969 Presence of functional implant, unspecified: Secondary | ICD-10-CM | POA: Diagnosis not present

## 2015-04-10 DIAGNOSIS — I7300 Raynaud's syndrome without gangrene: Secondary | ICD-10-CM | POA: Diagnosis not present

## 2015-04-10 DIAGNOSIS — M349 Systemic sclerosis, unspecified: Secondary | ICD-10-CM | POA: Diagnosis not present

## 2015-04-17 ENCOUNTER — Ambulatory Visit: Payer: Medicare Other | Attending: Orthopedic Surgery | Admitting: Physical Therapy

## 2015-04-17 DIAGNOSIS — R29898 Other symptoms and signs involving the musculoskeletal system: Secondary | ICD-10-CM | POA: Diagnosis not present

## 2015-04-17 DIAGNOSIS — M25551 Pain in right hip: Secondary | ICD-10-CM | POA: Diagnosis not present

## 2015-04-17 DIAGNOSIS — M25612 Stiffness of left shoulder, not elsewhere classified: Secondary | ICD-10-CM | POA: Insufficient documentation

## 2015-04-17 DIAGNOSIS — R531 Weakness: Secondary | ICD-10-CM | POA: Diagnosis not present

## 2015-04-17 NOTE — Therapy (Signed)
[name], [place], [address] Phone: [phone]   Fax:  [phone]  Physical Therapy Evaluation  Patient Details  Name: Haley Trevino MRN: [id number] Date of Birth: 05/05/1941 Referring Provider:  Rod Can, MD  Encounter Date: 04/17/2015      PT End of Session - 04/17/15 1154    Visit Number 1   Number of Visits 12   Date for PT Re-Evaluation 05/29/15   PT Start Time 1034   PT Stop Time 1114   PT Time Calculation (min) 40 min   Activity Tolerance Patient tolerated treatment well   Behavior During Therapy [name] for tasks assessed/performed      Past Medical History  Diagnosis Date  . Raynaud disease   . Scleroderma   . Osteoarthritis   . Dyspnea   . Thyroid disease     hypothyroidism  . Cataract     Past Surgical History  Procedure Laterality Date  . Cholecystectomy  1973  . Total abdominal hysterectomy  1974  . Breast enhancement surgery  1975  . Hand surgery  08/2012; 11/2012  . Melanoma excision      at 35 yrs of age  . Orif hip fracture Right 02/22/2014    Procedure: OPEN REDUCTION INTERNAL FIXATION RIGHT HIP;  Surgeon: Sanjuana Kava, MD;  Location: [facility];  Service: Orthopedics;  Laterality: Right;    There were no vitals filed for this visit.  Visit Diagnosis:  Right hip pain - Plan: PT plan of care cert/re-cert  Weakness of right hip - Plan: PT plan of care cert/re-cert      Subjective Assessment - 04/17/15 1145    Subjective I want to get my right leg feeling better.   Limitations Walking   How long can you walk comfortably? 10-15 minutes.   Patient Stated Goals I want to get my right leg feeling better.   Pain Score 4    Pain Location Leg   Pain Orientation Right   Pain Descriptors / Indicators Aching   Pain Onset More than a month ago            [name] PT Assessment - 04/17/15 0001    Assessment   Medical Diagnosis Hip flexor tendinits, right.   Onset  Date/Surgical Date --  Several months.   Precautions   Precautions None   Restrictions   Weight Bearing Restrictions No   Balance Screen   Has the patient fallen in the past 6 months Yes   How many times? Unsure.   Has the patient had a decrease in activity level because of a fear of falling?  Yes   Is the patient reluctant to leave their home because of a fear of falling?  Yes   [name]   Prior Function   Level of Independence Independent   ROM / Strength   AROM / PROM / Strength AROM;Strength   AROM   Overall AROM Comments Normal right hip ROM.   Strength   Overall Strength Comments Right hip abduction and ER= 4-/5.   Palpation   Palpation comment Tender posterior right greater trochanter and along her right ITB.  Referred pain into groin and c/o right knee pain "inside."   Ambulation/Gait   Gait Comments Antalgic and Trendelenburg gait pattern.                   [name] - 04/17/15 0001    Manual  Therapy   Manual Therapy Soft tissue mobilization   Manual therapy comments Patient in left SDLY position with 2 pillows between knees for comfort while receiving STW/M to right posterior  hip and ITB region x 10 minutes.                  PT Short Term Goals - 2015/04/22 1202    PT SHORT TERM GOAL #1   Title I with initial HEP   Time 2   Period Weeks           PT Long Term Goals - April 22, 2015 1202    PT LONG TERM GOAL #1   Title  I with advanced HEP   Time 6   Period Weeks   Status New   PT LONG TERM GOAL #2   Title Walk without deviation.   Time 6   Period Weeks   Status New   PT LONG TERM GOAL #4   Title Walk a community distance wiht pain not > 3/10.   Time 6   Period Weeks   Status New   PT LONG TERM GOAL #5   Title Perform ADL's with pain not > 3/10.               Plan - 2015-04-22 1159    Clinical Impression Statement I really flared-ip my right hip and  right knee up after a great deal of shopping and wlaking.  Resting pain-level is a 4/10 today but can be easily as high as 7+/10.  I patient use to walk 2 miles a day for exercise but no longer can perfom this.   Pt will benefit from skilled therapeutic intervention in order to improve on the following deficits Abnormal gait;Pain;Decreased activity tolerance;Decreased strength   Rehab Potential Good   PT Frequency 3x / week   PT Duration 4 weeks   PT Treatment/Interventions ADLs/Self Care Home Management;Electrical Stimulation;Ultrasound;Moist Heat;Therapeutic exercise;Therapeutic activities;Patient/family education;Manual techniques   PT Next Visit Plan Stationary bike; right LE strengthening (especially right hip abdiuction and ER).            G-Codes - 2015-04-22 1203    Functional Assessment Tool Used FOTO.   Functional Limitation Mobility: Walking and moving around   Mobility: Walking and Moving Around Current Status [phone]) At least 40 percent but less than 60 percent impaired, limited or restricted   Mobility: Walking and Moving Around Goal Status [phone]) At least 1 percent but less than 20 percent impaired, limited or restricted       Problem List Patient Active Problem List   Diagnosis Date Noted  . Impingement syndrome of left shoulder region 12/08/2014  . Osteoporosis 09/20/2014  . Hip fracture requiring operative repair ([place]) 02/22/2014  . Hypothyroidism 11/29/2013  . Depression 11/29/2013  . GERD (gastroesophageal reflux disease) 11/29/2013  . Hypertension 11/29/2013  . Hyperlipidemia with target LDL less than 100 11/29/2013  . Thyroid cyst 04/26/2013  . MALIGNANT MELANOMA, SKIN 08/07/2010  . DYSPNEA 08/07/2010  . [name] 08/06/2010  . SCLERODERMA 08/06/2010  . OSTEOARTHRITIS 08/06/2010    [name], [place] MPT 04-22-2015, 12:11 PM  [name] [address] [place], [place], [address] Phone: [phone]    Fax:  [phone]

## 2015-04-18 ENCOUNTER — Encounter: Payer: Self-pay | Admitting: Nurse Practitioner

## 2015-04-18 ENCOUNTER — Ambulatory Visit (INDEPENDENT_AMBULATORY_CARE_PROVIDER_SITE_OTHER): Payer: Medicare Other | Admitting: Nurse Practitioner

## 2015-04-18 VITALS — BP 140/82 | HR 67 | Temp 97.5°F | Ht 61.0 in | Wt 119.0 lb

## 2015-04-18 DIAGNOSIS — K219 Gastro-esophageal reflux disease without esophagitis: Secondary | ICD-10-CM | POA: Diagnosis not present

## 2015-04-18 DIAGNOSIS — F32A Depression, unspecified: Secondary | ICD-10-CM

## 2015-04-18 DIAGNOSIS — R208 Other disturbances of skin sensation: Secondary | ICD-10-CM | POA: Diagnosis not present

## 2015-04-18 DIAGNOSIS — I7301 Raynaud's syndrome with gangrene: Secondary | ICD-10-CM

## 2015-04-18 DIAGNOSIS — R110 Nausea: Secondary | ICD-10-CM | POA: Diagnosis not present

## 2015-04-18 DIAGNOSIS — M349 Systemic sclerosis, unspecified: Secondary | ICD-10-CM

## 2015-04-18 DIAGNOSIS — E039 Hypothyroidism, unspecified: Secondary | ICD-10-CM | POA: Diagnosis not present

## 2015-04-18 DIAGNOSIS — F329 Major depressive disorder, single episode, unspecified: Secondary | ICD-10-CM

## 2015-04-18 DIAGNOSIS — I10 Essential (primary) hypertension: Secondary | ICD-10-CM

## 2015-04-18 DIAGNOSIS — Z23 Encounter for immunization: Secondary | ICD-10-CM

## 2015-04-18 DIAGNOSIS — R200 Anesthesia of skin: Secondary | ICD-10-CM

## 2015-04-18 DIAGNOSIS — E785 Hyperlipidemia, unspecified: Secondary | ICD-10-CM

## 2015-04-18 DIAGNOSIS — R7989 Other specified abnormal findings of blood chemistry: Secondary | ICD-10-CM | POA: Diagnosis not present

## 2015-04-18 MED ORDER — OMEPRAZOLE 20 MG PO CPDR: 20.0000 mg | DELAYED_RELEASE_CAPSULE | Freq: Every day | ORAL | Status: DC

## 2015-04-18 MED ORDER — SYNTHROID 50 MCG PO TABS: ORAL_TABLET | ORAL | Status: DC

## 2015-04-18 MED ORDER — NIFEDIPINE ER OSMOTIC RELEASE 90 MG PO TB24: 90.0000 mg | ORAL_TABLET | Freq: Every day | ORAL | Status: DC

## 2015-04-18 MED ORDER — FLUOXETINE HCL 40 MG PO CAPS
ORAL_CAPSULE | ORAL | Status: DC
Start: 2015-04-18 — End: 2015-12-27

## 2015-04-18 MED ORDER — ONDANSETRON HCL 4 MG PO TABS: 4.0000 mg | ORAL_TABLET | Freq: Three times a day (TID) | ORAL | Status: DC | PRN

## 2015-04-18 NOTE — Progress Notes (Signed)
Subjective:    Patient ID: Haley Trevino, female    DOB: 08/02/1940, 74 y.o.   MRN: [id number]  HPI  Patient here today for follow up of chronic medical problems. Patient says that she is not doing well. She is very achy all over and having lots of left shoulder and left hip pain- Sees Dr. Onnie Graham. Occasional dizziness and nausea and says that she has no appetite and has lost about 10lbs. Doesn't really want o take any meds for nausea. Also for the last sick weeks she has had episodes of shakiness. It can be her legs or her hands- seems like hands shake all the time. Has to lay down for it to resolve. She says her hands shake all the time.  Sclerderma Getting worse- is being referred to [facility]- starting to get ulcers on fingers that get infected. Currently on doxy for finger infections- doesn't know how long she will be taking this. Having trouble walking due to so much pain. Raynauds Still has bad nerve pain and hurts to just touch anything. Has started on lyrica to help with nerve pain- could not tolerate gabapentin because it caused swelling. Has minimal swelling for lyrica but not like she had with gabapentin Hypothyroidism SYnthroid 67mg and doing well with that. Has fatigue but she thinks that is from everything nit just thyroid problems. Depression Decided to take a break from it because she has been on it so long- Sees psychiatrist next tomorrow and will probably start back on  It tomorrow. Hyperlipidemia-  Welchol is the only thing she can tolerate- statins make her hurt Hypertension On procardia which helps greatly with blood pressure and raynauds. Gerd Omeprazole works well to keep symptoms under control. Right hip fracture August 2015- has had PT but still has pain and is limping. Very unsteady and has fallen 2 x in the last several months    Review of Systems  Constitutional: Negative.   HENT: Negative.   Respiratory: Negative.   Cardiovascular: Negative.     Gastrointestinal: Negative.   Genitourinary: Negative.   Neurological: Negative.   Psychiatric/Behavioral: Negative.   All other systems reviewed and are negative.      Objective:   Physical Exam  Constitutional: She is oriented to person, place, and time. She appears well-developed and well-nourished.  HENT:  Nose: Nose normal.  Mouth/Throat: Oropharynx is clear and moist.  Eyes: EOM are normal.  Neck: Trachea normal, normal range of motion and full passive range of motion without pain. Neck supple. No JVD present. Carotid bruit is not present. No thyromegaly present.  Cardiovascular: Normal rate, regular rhythm, normal heart sounds and intact distal pulses.  Exam reveals no gallop and no friction rub.   No murmur heard. Tips of fingers are cold to touch with slow cap refill. Palms of hands are paler then they have been  Pulmonary/Chest: Effort normal and breath sounds normal.  Abdominal: Soft. Bowel sounds are normal. She exhibits no distension and no mass. There is no tenderness.  Musculoskeletal: Normal range of motion.  limps with gait but improving  Lymphadenopathy:    She has no cervical adenopathy.  Neurological: She is alert and oriented to person, place, and time. She has normal reflexes.  Skin: Skin is warm and dry.  Psychiatric: She has a normal mood and affect. Her behavior is normal. Judgment and thought content normal.    BP 140/82 mmHg  Pulse 67  Temp(Src) 97.5 F (36.4 C) (Oral)  Ht _0  (1.549  m)  Wt 119 lb (53.978 kg)  BMI 22.50 kg/m2       Assessment & Plan:  1. [name] with gangrene ([place]) Keep hands warm - NIFEdipine (PROCARDIA XL/ADALAT-CC) 90 MG 24 hr tablet; Take 1 tablet (90 mg total) by mouth daily.  Dispense: 90 tablet; Refill: 1  2. Essential hypertension Do not add salt to diet - CMP14+EGFR  3. Gastroesophageal reflux disease without esophagitis Avoid spicy foods Do not eat 2 hours prior to bedtime - omeprazole (PRILOSEC)  20 MG capsule; Take 1 capsule (20 mg total) by mouth daily.  Dispense: 90 capsule; Refill: 1  4. Hypothyroidism, unspecified hypothyroidism type - SYNTHROID 50 MCG tablet; Take 1 tablet (50 mcg total) by mouth daily before breakfast.  Dispense: 90 tablet; Refill: 2  5. SCLERODERMA Keep follow up with specialist  6. Depression Stress management - FLUoxetine (PROZAC) 40 MG capsule; Take 1 capsule (40 mg total) by mouth daily.  Dispense: 90 capsule; Refill: 1  7. Hyperlipidemia with target LDL less than 100 Low fat doiet - Lipid panel  8. Nausea Check blood sugars when has spells Given blood glucose monitor - ondansetron (ZOFRAN) 4 MG tablet; Take 1 tablet (4 mg total) by mouth every 8 (eight) hours as needed for nausea or vomiting.  Dispense: 20 tablet; Refill: 0 - Anemia Profile B     Labs pending Health maintenance reviewed Diet and exercise encouraged Continue all meds Follow up  In 3 month   [place], FNP

## 2015-04-18 NOTE — Patient Instructions (Signed)
Nausea, Adult °Nausea is the feeling that you have an upset stomach or have to vomit. Nausea by itself is not likely a serious concern, but it may be an early sign of more serious medical problems. As nausea gets worse, it can lead to vomiting. If vomiting develops, there is the risk of dehydration.  °CAUSES  °· Viral infections. °· Food poisoning. °· Medicines. °· Pregnancy. °· Motion sickness. °· Migraine headaches. °· Emotional distress. °· Severe pain from any source. °· Alcohol intoxication. °HOME CARE INSTRUCTIONS °· Get plenty of rest. °· Ask your caregiver about specific rehydration instructions. °· Eat small amounts of food and sip liquids more often. °· Take all medicines as told by your caregiver. °SEEK MEDICAL CARE IF: °· You have not improved after 2 days, or you get worse. °· You have a headache. °SEEK IMMEDIATE MEDICAL CARE IF:  °· You have a fever. °· You faint. °· You keep vomiting or have blood in your vomit. °· You are extremely weak or dehydrated. °· You have dark or bloody stools. °· You have severe chest or abdominal pain. °MAKE SURE YOU: °· Understand these instructions. °· Will watch your condition. °· Will get help right away if you are not doing well or get worse. °  °This information is not intended to replace advice given to you by your health care provider. Make sure you discuss any questions you have with your health care provider. °  °Document Released: 07/31/2004 Document Revised: 07/14/2014 Document Reviewed: 03/05/2011 °Elsevier Interactive Patient Education ©2016 [employer] Inc. ° °

## 2015-04-19 ENCOUNTER — Other Ambulatory Visit: Payer: Self-pay | Admitting: Nurse Practitioner

## 2015-04-19 ENCOUNTER — Encounter: Payer: Self-pay | Admitting: Physical Therapy

## 2015-04-19 ENCOUNTER — Ambulatory Visit: Payer: Medicare Other | Admitting: Physical Therapy

## 2015-04-19 DIAGNOSIS — M25612 Stiffness of left shoulder, not elsewhere classified: Secondary | ICD-10-CM | POA: Diagnosis not present

## 2015-04-19 DIAGNOSIS — Z23 Encounter for immunization: Secondary | ICD-10-CM

## 2015-04-19 DIAGNOSIS — M25551 Pain in right hip: Secondary | ICD-10-CM | POA: Diagnosis not present

## 2015-04-19 DIAGNOSIS — R29898 Other symptoms and signs involving the musculoskeletal system: Secondary | ICD-10-CM | POA: Diagnosis not present

## 2015-04-19 DIAGNOSIS — R531 Weakness: Secondary | ICD-10-CM | POA: Diagnosis not present

## 2015-04-19 LAB — CMP14+EGFR
A/G RATIO: 1.9 (ref 1.1–2.5)
ALK PHOS: 60 IU/L (ref 39–117)
ALT: 6 IU/L (ref 0–32)
AST: 12 IU/L (ref 0–40)
Albumin: 4.5 g/dL (ref 3.5–4.8)
BUN/Creatinine Ratio: 16 (ref 11–26)
BUN: 11 mg/dL (ref 8–27)
CHLORIDE: 102 mmol/L (ref 97–108)
CO2: 21 mmol/L (ref 18–29)
Calcium: 9.2 mg/dL (ref 8.7–10.3)
Creatinine, Ser: 0.67 mg/dL (ref 0.57–1.00)
GFR calc non Af Amer: 87 mL/min/{1.73_m2} (ref >59)
GFR, EST AFRICAN AMERICAN: 101 mL/min/{1.73_m2} (ref >59)
Globulin, Total: 2.4 g/dL (ref 1.5–4.5)
Glucose: 93 mg/dL (ref 65–99)
POTASSIUM: 5 mmol/L (ref 3.5–5.2)
Sodium: 141 mmol/L (ref 134–144)
Total Protein: 6.9 g/dL (ref 6.0–8.5)

## 2015-04-19 LAB — ANEMIA PROFILE B
BASOS ABS: 0 10*3/uL (ref 0.0–0.2)
Basos: 0 %
EOS (ABSOLUTE): 0.2 10*3/uL (ref 0.0–0.4)
Eos: 3 %
FERRITIN: 9 ng/mL — AB (ref 15–150)
FOLATE: 3.2 ng/mL (ref >3.0)
Hematocrit: 32.7 % — ABNORMAL LOW (ref 34.0–46.6)
Hemoglobin: 11.1 g/dL (ref 11.1–15.9)
IRON SATURATION: 6 % — AB (ref 15–55)
IRON: 23 ug/dL — AB (ref 27–139)
Immature Grans (Abs): 0 10*3/uL (ref 0.0–0.1)
Immature Granulocytes: 0 %
LYMPHS ABS: 2.7 10*3/uL (ref 0.7–3.1)
Lymphs: 30 %
MCH: 28.7 pg (ref 26.6–33.0)
MCHC: 33.9 g/dL (ref 31.5–35.7)
MCV: 85 fL (ref 79–97)
Monocytes Absolute: 0.7 10*3/uL (ref 0.1–0.9)
Monocytes: 7 %
NEUTROS ABS: 5.3 10*3/uL (ref 1.4–7.0)
Neutrophils: 60 %
PLATELETS: 434 10*3/uL — AB (ref 150–379)
RBC: 3.87 x10E6/uL (ref 3.77–5.28)
RDW: 16.3 % — ABNORMAL HIGH (ref 12.3–15.4)
Retic Ct Pct: 0.5 % — ABNORMAL LOW (ref 0.6–2.6)
TIBC: 370 ug/dL (ref 250–450)
UIBC: 347 ug/dL (ref 118–369)
VITAMIN B 12: 268 pg/mL (ref 211–946)
WBC: 8.9 10*3/uL (ref 3.4–10.8)

## 2015-04-19 LAB — LIPID PANEL
Chol/HDL Ratio: 4.3 ratio units (ref 0.0–4.4)
Cholesterol, Total: 230 mg/dL — ABNORMAL HIGH (ref 100–199)
HDL: 54 mg/dL (ref >39)
LDL Calculated: 154 mg/dL — ABNORMAL HIGH (ref 0–99)
TRIGLYCERIDES: 110 mg/dL (ref 0–149)
VLDL Cholesterol Cal: 22 mg/dL (ref 5–40)

## 2015-04-19 MED ORDER — HEMOCYTE PLUS 106-1 MG PO CAPS: 1.0000 | ORAL_CAPSULE | Freq: Every day | ORAL | Status: DC

## 2015-04-19 NOTE — Therapy (Signed)
[facility] [place], [place], [address] Phone: [phone]   Fax:  [phone]  Physical Therapy Treatment  Patient Details  Name: Haley Trevino MRN: [id number] Date of Birth: July 09, 1940 Referring Provider:  [name], *  Encounter Date: 04/19/2015      PT End of Session - 04/19/15 1107    Visit Number 2   Number of Visits 12   PT Start Time 1030   PT Stop Time 1110   PT Time Calculation (min) 40 min   Activity Tolerance Patient tolerated treatment well   Behavior During Therapy [facility] for tasks assessed/performed      Past Medical History  Diagnosis Date  . Raynaud disease   . Scleroderma ([place])   . Osteoarthritis   . Dyspnea   . Thyroid disease     hypothyroidism  . Cataract     Past Surgical History  Procedure Laterality Date  . Cholecystectomy  1973  . Total abdominal hysterectomy  1974  . Breast enhancement surgery  1975  . Hand surgery  08/2012; 11/2012  . Melanoma excision      at 66 yrs of age  . Orif hip fracture Right 02/22/2014    Procedure: OPEN REDUCTION INTERNAL FIXATION RIGHT HIP;  Surgeon: Sanjuana Kava, MD;  Location: [facility];  Service: Orthopedics;  Laterality: Right;    There were no vitals filed for this visit.  Visit Diagnosis:  Right hip pain  Weakness of right hip      Subjective Assessment - 04/19/15 1033    Subjective I want to get my right leg feeling better.   Limitations Walking   How long can you walk comfortably? 10-15 minutes.   Diagnostic tests xray   Patient Stated Goals I want to get my right leg feeling better.   Currently in Pain? Yes   Pain Score 2    Pain Location Hip   Pain Orientation Right   Pain Descriptors / Indicators Aching   Pain Onset More than a month ago   Pain Frequency Intermittent   Aggravating Factors  prolong activity   Pain Relieving Factors rest                         [facility] Adult PT Treatment/Exercise -  04/19/15 0001    Exercises   Exercises Knee/Hip   Knee/Hip Exercises: Stretches   Hip Flexor Stretch Right;3 reps;30 seconds   Knee/Hip Exercises: Aerobic   Nustep L4 x54min LE only   Knee/Hip Exercises: Standing   Hip Flexion Stengthening;Left;3 sets;10 reps  with 3#   Knee/Hip Exercises: Supine   Bridges Strengthening;3 sets;10 reps   Straight Leg Raise with External Rotation Strengthening;Right;3 sets;10 reps   Knee/Hip Exercises: Sidelying   Hip ABduction Strengthening;Right;10 reps;3 sets   Clams 3x10   Knee/Hip Exercises: Prone   Hip Extension Strengthening;Right;3 sets;10 reps                  PT Short Term Goals - 04/17/15 1202    PT SHORT TERM GOAL #1   Title I with initial HEP   Time 2   Period Weeks           PT Long Term Goals - 04/17/15 1202    PT LONG TERM GOAL #1   Title  I with advanced HEP   Time 6   Period Weeks   Status New   PT LONG TERM GOAL #2   Title Walk  without deviation.   Time 6   Period Weeks   Status New   PT LONG TERM GOAL #4   Title Walk a community distance wiht pain not > 3/10.   Time 6   Period Weeks   Status New   PT LONG TERM GOAL #5   Title Perform ADL's with pain not > 3/10.               Plan - 04/19/15 1109    Clinical Impression Statement patient tolerated treatment very well today with no reports of pain increase. patient has les pain when at rest or seated and up to 2-3/10 with standing activities. Paient goals ongoing due to pain and gait deviation limits.   Pt will benefit from skilled therapeutic intervention in order to improve on the following deficits Abnormal gait;Pain;Decreased activity tolerance;Decreased strength   Rehab Potential Good   PT Frequency 3x / week   PT Duration 4 weeks   PT Treatment/Interventions ADLs/Self Care Home Management;Electrical Stimulation;Ultrasound;Moist Heat;Therapeutic exercise;Therapeutic activities;Patient/family education;Manual techniques   PT Next Visit  Plan Stationary bike; right LE strengthening (especially right hip abdiuction and ER).     PT Home Exercise Plan issue HEP next visit   Consulted and Agree with Plan of Care Patient        Problem List Patient Active Problem List   Diagnosis Date Noted  . Impingement syndrome of left shoulder region 12/08/2014  . Osteoporosis 09/20/2014  . Hip fracture requiring operative repair ([place]) 02/22/2014  . Hypothyroidism 11/29/2013  . Depression 11/29/2013  . GERD (gastroesophageal reflux disease) 11/29/2013  . Hypertension 11/29/2013  . Hyperlipidemia with target LDL less than 100 11/29/2013  . Thyroid cyst 04/26/2013  . MALIGNANT MELANOMA, SKIN 08/07/2010  . DYSPNEA 08/07/2010  . [name] 08/06/2010  . SCLERODERMA 08/06/2010  . OSTEOARTHRITIS 08/06/2010    Wesly Whisenant P, PTA 04/19/2015, 11:14 AM  [facility] [address] [place], [place], [address] Phone: [phone]   Fax:  [phone]

## 2015-04-24 ENCOUNTER — Ambulatory Visit: Payer: Medicare Other | Admitting: Physical Therapy

## 2015-04-24 ENCOUNTER — Encounter: Payer: Self-pay | Admitting: Physical Therapy

## 2015-04-24 DIAGNOSIS — R531 Weakness: Secondary | ICD-10-CM | POA: Diagnosis not present

## 2015-04-24 DIAGNOSIS — R29898 Other symptoms and signs involving the musculoskeletal system: Secondary | ICD-10-CM

## 2015-04-24 DIAGNOSIS — M25551 Pain in right hip: Secondary | ICD-10-CM

## 2015-04-24 DIAGNOSIS — M25612 Stiffness of left shoulder, not elsewhere classified: Secondary | ICD-10-CM | POA: Diagnosis not present

## 2015-04-24 NOTE — Therapy (Signed)
[facility] [place], [place], [address] Phone: [phone]   Fax:  [phone]  Physical Therapy Treatment  Patient Details  Name: Haley Trevino MRN: [id number] Date of Birth: 1941/02/16 No Data Recorded  Encounter Date: 04/24/2015      PT End of Session - 04/24/15 1143    Visit Number 3   Number of Visits 12   Date for PT Re-Evaluation 05/29/15   PT Start Time 1114   PT Stop Time 1154   PT Time Calculation (min) 40 min   Activity Tolerance Patient tolerated treatment well   Behavior During Therapy [facility] for tasks assessed/performed      Past Medical History  Diagnosis Date  . Raynaud disease   . Scleroderma ([place])   . Osteoarthritis   . Dyspnea   . Thyroid disease     hypothyroidism  . Cataract     Past Surgical History  Procedure Laterality Date  . Cholecystectomy  1973  . Total abdominal hysterectomy  1974  . Breast enhancement surgery  1975  . Hand surgery  08/2012; 11/2012  . Melanoma excision      at 46 yrs of age  . Orif hip fracture Right 02/22/2014    Procedure: OPEN REDUCTION INTERNAL FIXATION RIGHT HIP;  Surgeon: Sanjuana Kava, MD;  Location: [facility];  Service: Orthopedics;  Laterality: Right;    There were no vitals filed for this visit.  Visit Diagnosis:  Right hip pain  Weakness of right hip      Subjective Assessment - 04/24/15 1120    Subjective Felt good after last treatment per patient   Limitations Walking   How long can you walk comfortably? 10-15 minutes.   Diagnostic tests xray   Patient Stated Goals I want to get my right leg feeling better.   Currently in Pain? Yes   Pain Score 2    Pain Location Hip   Pain Orientation Right   Pain Descriptors / Indicators Aching   Pain Onset More than a month ago   Pain Frequency Intermittent   Aggravating Factors  prolong activity   Pain Relieving Factors rest                         [facility] Adult PT  Treatment/Exercise - 04/24/15 0001    Knee/Hip Exercises: Stretches   Hip Flexor Stretch Right;3 reps;30 seconds   Knee/Hip Exercises: Aerobic   Nustep L4 x66min LE only   Knee/Hip Exercises: Standing   Lateral Step Up Right;3 sets;10 reps;Step Height: 6"   Forward Step Up Right;3 sets;10 reps;Step Height: 6"   Step Down Right;10 reps;Step Height: 4";3 sets   Knee/Hip Exercises: Supine   Bridges Strengthening;3 sets;10 reps   Straight Leg Raise with External Rotation Strengthening;Right;3 sets;10 reps   Knee/Hip Exercises: Sidelying   Hip ABduction Strengthening;Right;3 sets;10 reps   Clams 3x10   Knee/Hip Exercises: Prone   Hip Extension Strengthening;Right;3 sets;10 reps                  PT Short Term Goals - 04/17/15 1202    PT SHORT TERM GOAL #1   Title I with initial HEP   Time 2   Period Weeks           PT Long Term Goals - 04/17/15 1202    PT LONG TERM GOAL #1   Title  I with advanced HEP   Time 6   Period Weeks  Status New   PT LONG TERM GOAL #2   Title Walk without deviation.   Time 6   Period Weeks   Status New   PT LONG TERM GOAL #4   Title Walk a community distance wiht pain not > 3/10.   Time 6   Period Weeks   Status New   PT LONG TERM GOAL #5   Title Perform ADL's with pain not > 3/10.               Plan - 04/24/15 1146    Clinical Impression Statement Patient progresing thus far with no increased pain and able to progress with strengthening exercises with no complaints or difficulty. Patient progressing toward goals yet ongoing due to pain and gait deviation. slight gait deviation.   Pt will benefit from skilled therapeutic intervention in order to improve on the following deficits Abnormal gait;Pain;Decreased activity tolerance;Decreased strength   Rehab Potential Good   PT Frequency 3x / week   PT Duration 4 weeks   PT Treatment/Interventions ADLs/Self Care Home Management;Electrical Stimulation;Ultrasound;Moist  Heat;Therapeutic exercise;Therapeutic activities;Patient/family education;Manual techniques   PT Next Visit Plan Stationary bike; right LE strengthening (especially right hip abdiuction and ER).     Consulted and Agree with Plan of Care Patient        Problem List Patient Active Problem List   Diagnosis Date Noted  . Impingement syndrome of left shoulder region 12/08/2014  . Osteoporosis 09/20/2014  . Hip fracture requiring operative repair ([place]) 02/22/2014  . Hypothyroidism 11/29/2013  . Depression 11/29/2013  . GERD (gastroesophageal reflux disease) 11/29/2013  . Hypertension 11/29/2013  . Hyperlipidemia with target LDL less than 100 11/29/2013  . Thyroid cyst 04/26/2013  . MALIGNANT MELANOMA, SKIN 08/07/2010  . DYSPNEA 08/07/2010  . [name] 08/06/2010  . SCLERODERMA 08/06/2010  . OSTEOARTHRITIS 08/06/2010    Enzo Treu P, PTA 04/24/2015, 11:54 AM  [facility] [place], [place], [address] Phone: [phone]   Fax:  [phone]  Name: Haley Trevino MRN: [id number] Date of Birth: 1940-10-05

## 2015-04-26 ENCOUNTER — Ambulatory Visit: Payer: Medicare Other | Admitting: Physical Therapy

## 2015-04-26 DIAGNOSIS — M25612 Stiffness of left shoulder, not elsewhere classified: Secondary | ICD-10-CM | POA: Diagnosis not present

## 2015-04-26 DIAGNOSIS — R531 Weakness: Secondary | ICD-10-CM

## 2015-04-26 DIAGNOSIS — M25551 Pain in right hip: Secondary | ICD-10-CM | POA: Diagnosis not present

## 2015-04-26 DIAGNOSIS — R29898 Other symptoms and signs involving the musculoskeletal system: Secondary | ICD-10-CM

## 2015-04-26 NOTE — Therapy (Signed)
[facility] [place], [place], [address] Phone: [phone]   Fax:  [phone]  Physical Therapy Treatment  Patient Details  Name: LATICHA FERRUCCI MRN: [id number] Date of Birth: 1940/11/29 No Data Recorded  Encounter Date: 04/26/2015      PT End of Session - 04/26/15 1311    Visit Number 4   Number of Visits 12   Date for PT Re-Evaluation 05/29/15   PT Start Time 1115   PT Stop Time 1158   PT Time Calculation (min) 43 min   Activity Tolerance Patient tolerated treatment well   Behavior During Therapy [facility] for tasks assessed/performed      Past Medical History  Diagnosis Date  . Raynaud disease   . Scleroderma ([place])   . Osteoarthritis   . Dyspnea   . Thyroid disease     hypothyroidism  . Cataract     Past Surgical History  Procedure Laterality Date  . Cholecystectomy  1973  . Total abdominal hysterectomy  1974  . Breast enhancement surgery  1975  . Hand surgery  08/2012; 11/2012  . Melanoma excision      at 62 yrs of age  . Orif hip fracture Right 02/22/2014    Procedure: OPEN REDUCTION INTERNAL FIXATION RIGHT HIP;  Surgeon: Sanjuana Kava, MD;  Location: [facility];  Service: Orthopedics;  Laterality: Right;    There were no vitals filed for this visit.  Visit Diagnosis:  Right hip pain  Weakness of right hip  Stiffness of left shoulder joint  Generalized weakness                                 PT Short Term Goals - 04/17/15 1202    PT SHORT TERM GOAL #1   Title I with initial HEP   Time 2   Period Weeks           PT Long Term Goals - 04/17/15 1202    PT LONG TERM GOAL #1   Title  I with advanced HEP   Time 6   Period Weeks   Status New   PT LONG TERM GOAL #2   Title Walk without deviation.   Time 6   Period Weeks   Status New   PT LONG TERM GOAL #4   Title Walk a community distance wiht pain not > 3/10.   Time 6   Period Weeks   Status New   PT LONG  TERM GOAL #5   Title Perform ADL's with pain not > 3/10.               Problem List Patient Active Problem List   Diagnosis Date Noted  . Impingement syndrome of left shoulder region 12/08/2014  . Osteoporosis 09/20/2014  . Hip fracture requiring operative repair ([place]) 02/22/2014  . Hypothyroidism 11/29/2013  . Depression 11/29/2013  . GERD (gastroesophageal reflux disease) 11/29/2013  . Hypertension 11/29/2013  . Hyperlipidemia with target LDL less than 100 11/29/2013  . Thyroid cyst 04/26/2013  . MALIGNANT MELANOMA, SKIN 08/07/2010  . DYSPNEA 08/07/2010  . [name] 08/06/2010  . SCLERODERMA 08/06/2010  . OSTEOARTHRITIS 08/06/2010    Treatment:  Stationary bike x 15 minutes f/b left SDLY position with pillow between knees:  STW/M to left affected posterior greater trochanter region x 23 minutes.  Patient felt good after last treatment.    Sheehan Stacey, [place] MPT 04/26/2015, 1:17 PM  [facility] [place], [place], [address] Phone: [phone]   Fax:  [phone]  Name: DESHUNDRA WALLER MRN: [id number] Date of Birth: [birth year]

## 2015-05-01 ENCOUNTER — Ambulatory Visit: Payer: Medicare Other | Admitting: Physical Therapy

## 2015-05-01 DIAGNOSIS — R29898 Other symptoms and signs involving the musculoskeletal system: Secondary | ICD-10-CM

## 2015-05-01 DIAGNOSIS — R531 Weakness: Secondary | ICD-10-CM | POA: Diagnosis not present

## 2015-05-01 DIAGNOSIS — M25551 Pain in right hip: Secondary | ICD-10-CM

## 2015-05-01 DIAGNOSIS — M25612 Stiffness of left shoulder, not elsewhere classified: Secondary | ICD-10-CM | POA: Diagnosis not present

## 2015-05-01 NOTE — Therapy (Signed)
[facility] [place], [place], [address] Phone: [phone]   Fax:  [phone]  Physical Therapy Treatment  Patient Details  Name: Haley Trevino MRN: [id number] Date of Birth: [birth year] No Data Recorded  Encounter Date: 05/01/2015      PT End of Session - 05/01/15 1714    Visit Number 5   Number of Visits 12   Date for PT Re-Evaluation 05/29/15   PT Start Time 0400   PT Stop Time 0448   PT Time Calculation (min) 48 min   Activity Tolerance Patient tolerated treatment well   Behavior During Therapy [facility] for tasks assessed/performed      Past Medical History  Diagnosis Date  . Raynaud disease   . Scleroderma ([place])   . Osteoarthritis   . Dyspnea   . Thyroid disease     hypothyroidism  . Cataract     Past Surgical History  Procedure Laterality Date  . Cholecystectomy  1973  . Total abdominal hysterectomy  1974  . Breast enhancement surgery  1975  . Hand surgery  08/2012; 11/2012  . Melanoma excision      at 76 yrs of age  . Orif hip fracture Right 02/22/2014    Procedure: OPEN REDUCTION INTERNAL FIXATION RIGHT HIP;  Surgeon: Sanjuana Kava, MD;  Location: [facility];  Service: Orthopedics;  Laterality: Right;    There were no vitals filed for this visit.  Visit Diagnosis:  Right hip pain  Weakness of right hip      Subjective Assessment - 05/01/15 1715    Subjective I don't feel like exercsing too much as I'm on a new medication that has made me feel nauseous.  The patien states that soft tissue work has been most helpful.   Pain Score 2    Pain Location Hip   Pain Orientation Right   Pain Descriptors / Indicators Aching   Pain Onset More than a month ago                                   PT Short Term Goals - 04/17/15 1202    PT SHORT TERM GOAL #1   Title I with initial HEP   Time 2   Period Weeks           PT Long Term Goals - 04/17/15 1202    PT LONG TERM  GOAL #1   Title  I with advanced HEP   Time 6   Period Weeks   Status New   PT LONG TERM GOAL #2   Title Walk without deviation.   Time 6   Period Weeks   Status New   PT LONG TERM GOAL #4   Title Walk a community distance wiht pain not > 3/10.   Time 6   Period Weeks   Status New   PT LONG TERM GOAL #5   Title Perform ADL's with pain not > 3/10.               Problem List Patient Active Problem List   Diagnosis Date Noted  . Impingement syndrome of left shoulder region 12/08/2014  . Osteoporosis 09/20/2014  . Hip fracture requiring operative repair ([place]) 02/22/2014  . Hypothyroidism 11/29/2013  . Depression 11/29/2013  . GERD (gastroesophageal reflux disease) 11/29/2013  . Hypertension 11/29/2013  . Hyperlipidemia with target LDL less than 100 11/29/2013  . Thyroid cyst 04/26/2013  .  MALIGNANT MELANOMA, SKIN 08/07/2010  . DYSPNEA 08/07/2010  . [name] 08/06/2010  . SCLERODERMA 08/06/2010  . OSTEOARTHRITIS 08/06/2010   Treatment:  Elliptical L4/L4 x 4 minutes f/b STW/M to patient's right hip and along the length of her lateral thigh over the length of her right ITB (patient in left SDLY position with pillows between knees for comfort) x 36 minutes.  Patient felt good after treatment.   [place], [place] MPT 05/01/2015, 5:34 PM  [facility] [address] [place], [place], [address] Phone: [phone]   Fax:  [phone]  Name: Haley Trevino MRN: [id number] Date of Birth: 08/12/1940

## 2015-05-03 ENCOUNTER — Encounter: Payer: Medicare Other | Admitting: Physical Therapy

## 2015-05-07 ENCOUNTER — Telehealth: Payer: Self-pay | Admitting: Nurse Practitioner

## 2015-05-08 ENCOUNTER — Ambulatory Visit (INDEPENDENT_AMBULATORY_CARE_PROVIDER_SITE_OTHER): Payer: Medicare Other | Admitting: *Deleted

## 2015-05-08 ENCOUNTER — Ambulatory Visit: Payer: Medicare Other | Attending: Orthopedic Surgery | Admitting: Physical Therapy

## 2015-05-08 VITALS — BP 124/58

## 2015-05-08 DIAGNOSIS — M25551 Pain in right hip: Secondary | ICD-10-CM

## 2015-05-08 DIAGNOSIS — R29898 Other symptoms and signs involving the musculoskeletal system: Secondary | ICD-10-CM

## 2015-05-08 DIAGNOSIS — I10 Essential (primary) hypertension: Secondary | ICD-10-CM

## 2015-05-08 NOTE — Therapy (Signed)
[facility] [place], [place], [address] Phone: [phone]   Fax:  [phone]  Physical Therapy Treatment  Patient Details  Name: Haley Trevino MRN: [id number] Date of Birth: 12/03/1940 No Data Recorded  Encounter Date: 05/08/2015      PT End of Session - 05/08/15 1626    Visit Number 6   Number of Visits 12   Date for PT Re-Evaluation 05/29/15   PT Start Time 0315   PT Stop Time 0407   PT Time Calculation (min) 52 min   Activity Tolerance Patient tolerated treatment well   Behavior During Therapy [facility] for tasks assessed/performed      Past Medical History  Diagnosis Date  . Raynaud disease   . Scleroderma ([place])   . Osteoarthritis   . Dyspnea   . Thyroid disease     hypothyroidism  . Cataract     Past Surgical History  Procedure Laterality Date  . Cholecystectomy  1973  . Total abdominal hysterectomy  1974  . Breast enhancement surgery  1975  . Hand surgery  08/2012; 11/2012  . Melanoma excision      at 72 yrs of age  . Orif hip fracture Right 02/22/2014    Procedure: OPEN REDUCTION INTERNAL FIXATION RIGHT HIP;  Surgeon: Sanjuana Kava, MD;  Location: [facility];  Service: Orthopedics;  Laterality: Right;    There were no vitals filed for this visit.  Visit Diagnosis:  Right hip pain  Weakness of right hip      Subjective Assessment - 05/08/15 1627    Subjective Not having that groin pain like i was.  Pain pretty low today also.   Pain Score 2    Pain Location Hip   Pain Orientation Right   Pain Descriptors / Indicators Aching   Pain Onset More than a month ago                                   PT Short Term Goals - 04/17/15 1202    PT SHORT TERM GOAL #1   Title I with initial HEP   Time 2   Period Weeks           PT Long Term Goals - 04/17/15 1202    PT LONG TERM GOAL #1   Title  I with advanced HEP   Time 6   Period Weeks   Status New   PT LONG TERM  GOAL #2   Title Walk without deviation.   Time 6   Period Weeks   Status New   PT LONG TERM GOAL #4   Title Walk a community distance wiht pain not > 3/10.   Time 6   Period Weeks   Status New   PT LONG TERM GOAL #5   Title Perform ADL's with pain not > 3/10.               Problem List Patient Active Problem List   Diagnosis Date Noted  . Impingement syndrome of left shoulder region 12/08/2014  . Osteoporosis 09/20/2014  . Hip fracture requiring operative repair ([place]) 02/22/2014  . Hypothyroidism 11/29/2013  . Depression 11/29/2013  . GERD (gastroesophageal reflux disease) 11/29/2013  . Hypertension 11/29/2013  . Hyperlipidemia with target LDL less than 100 11/29/2013  . Thyroid cyst 04/26/2013  . MALIGNANT MELANOMA, SKIN 08/07/2010  . DYSPNEA 08/07/2010  . [name] 08/06/2010  .  SCLERODERMA 08/06/2010  . OSTEOARTHRITIS 08/06/2010   Treatment:  Nustep x 11 minutes at level 5 f/b hip abduction (left sdly position) to fatique and ER to fatigue f/b STW/M including IASTM to right hip; and along length of patient's right ITB x 30 minutes.  No pain reported after treatment.  APPLEGATE, [place] 05/08/2015, 4:31 PM  [facility] [address] [place], [place], [address] Phone: [phone]   Fax:  [phone]  Name: Haley Trevino MRN: [id number] Date of Birth: [birth year]

## 2015-05-08 NOTE — Progress Notes (Signed)
Pt here for BP check

## 2015-05-10 ENCOUNTER — Ambulatory Visit: Payer: Medicare Other | Admitting: Physical Therapy

## 2015-05-10 DIAGNOSIS — R29898 Other symptoms and signs involving the musculoskeletal system: Secondary | ICD-10-CM | POA: Diagnosis not present

## 2015-05-10 DIAGNOSIS — M25551 Pain in right hip: Secondary | ICD-10-CM

## 2015-05-10 NOTE — Therapy (Signed)
[facility] [place], [place], [address] Phone: [phone]   Fax:  [phone]  Physical Therapy Treatment  Patient Details  Name: Haley Trevino MRN: [id number] Date of Birth: [birth year] No Data Recorded  Encounter Date: 05/10/2015      PT End of Session - 05/10/15 1725    Visit Number 7   Number of Visits 12   Date for PT Re-Evaluation 05/29/15   PT Start Time 0315   PT Stop Time 0404   PT Time Calculation (min) 49 min      Past Medical History  Diagnosis Date  . Raynaud disease   . Scleroderma ([place])   . Osteoarthritis   . Dyspnea   . Thyroid disease     hypothyroidism  . Cataract     Past Surgical History  Procedure Laterality Date  . Cholecystectomy  1973  . Total abdominal hysterectomy  1974  . Breast enhancement surgery  1975  . Hand surgery  08/2012; 11/2012  . Melanoma excision      at 26 yrs of age  . Orif hip fracture Right 02/22/2014    Procedure: OPEN REDUCTION INTERNAL FIXATION RIGHT HIP;  Surgeon: Sanjuana Kava, MD;  Location: [facility];  Service: Orthopedics;  Laterality: Right;    There were no vitals filed for this visit.  Visit Diagnosis:  Right hip pain  Weakness of right hip      Subjective Assessment - 05/10/15 1727    Subjective Patient very pleased with her progress and feels much better.  Sheis requesting her treatments be put on hold at this time.   Limitations Walking   How long can you walk comfortably? 10-15 minutes.   Diagnostic tests xray   Patient Stated Goals I want to get my right leg feeling better.   Pain Score 1    Pain Location Hip   Pain Orientation Right   Pain Descriptors / Indicators Aching   Pain Onset More than a month ago   Pain Frequency Intermittent   Aggravating Factors  Prolonged activity.   Pain Relieving Factors Rest.                         [facility] Adult PT Treatment/Exercise - 05/10/15 0001    Exercises   Exercises Knee/Hip    Knee/Hip Exercises: Aerobic   Stationary Bike Level 2 x 6 minutes.   Manual Therapy   Manual therapy comments Patient in left sdly position with pillows between knees for comfort while receiving STW/M x 32 minutes from left hip and greater trochanter region and along the length of her left ITB.                  PT Short Term Goals - 04/17/15 1202    PT SHORT TERM GOAL #1   Title I with initial HEP   Time 2   Period Weeks           PT Long Term Goals - 05/10/15 1731    PT LONG TERM GOAL #1   Title  I with advanced HEP   Time 6   Period Weeks   Status On-going   PT LONG TERM GOAL #2   Title Walk without deviation.   Time 6   Period Weeks   Status Achieved               Plan - 05/10/15 1731    Clinical Impression Statement Patient pain-free after  today's treatment and requesting a 2 week hold on therapy at this point.   Pt will benefit from skilled therapeutic intervention in order to improve on the following deficits Abnormal gait;Pain;Decreased activity tolerance;Decreased strength        Problem List Patient Active Problem List   Diagnosis Date Noted  . Impingement syndrome of left shoulder region 12/08/2014  . Osteoporosis 09/20/2014  . Hip fracture requiring operative repair ([place]) 02/22/2014  . Hypothyroidism 11/29/2013  . Depression 11/29/2013  . GERD (gastroesophageal reflux disease) 11/29/2013  . Hypertension 11/29/2013  . Hyperlipidemia with target LDL less than 100 11/29/2013  . Thyroid cyst 04/26/2013  . MALIGNANT MELANOMA, SKIN 08/07/2010  . DYSPNEA 08/07/2010  . [name] 08/06/2010  . SCLERODERMA 08/06/2010  . OSTEOARTHRITIS 08/06/2010    Haley Trevino, Haley Trevino 05/10/2015, 5:35 PM  [facility] [address] [place], [place], [address] Phone: [phone]   Fax:  [phone]  Name: Haley Trevino MRN: [id number] Date of Birth: 21-May-1941

## 2015-05-22 DIAGNOSIS — H25813 Combined forms of age-related cataract, bilateral: Secondary | ICD-10-CM | POA: Diagnosis not present

## 2015-05-22 DIAGNOSIS — H43813 Vitreous degeneration, bilateral: Secondary | ICD-10-CM | POA: Diagnosis not present

## 2015-05-22 DIAGNOSIS — H1852 Epithelial (juvenile) corneal dystrophy: Secondary | ICD-10-CM | POA: Diagnosis not present

## 2015-05-23 ENCOUNTER — Telehealth: Payer: Self-pay | Admitting: Nurse Practitioner

## 2015-05-23 NOTE — Telephone Encounter (Signed)
Please advise 

## 2015-05-24 NOTE — Telephone Encounter (Signed)
She can try OTC iron just will have to take at least 2 daily

## 2015-05-25 NOTE — Telephone Encounter (Signed)
Patient aware and will try OTC.

## 2015-05-29 ENCOUNTER — Ambulatory Visit: Payer: Medicare Other | Admitting: *Deleted

## 2015-05-29 ENCOUNTER — Encounter: Payer: Self-pay | Admitting: *Deleted

## 2015-05-29 ENCOUNTER — Other Ambulatory Visit: Payer: Self-pay | Admitting: Family

## 2015-05-29 DIAGNOSIS — M25551 Pain in right hip: Secondary | ICD-10-CM | POA: Diagnosis not present

## 2015-05-29 DIAGNOSIS — R29898 Other symptoms and signs involving the musculoskeletal system: Secondary | ICD-10-CM | POA: Diagnosis not present

## 2015-05-29 NOTE — Therapy (Signed)
[facility] [place], [place], [address] Phone: [phone]   Fax:  [phone]  Physical Therapy Treatment  Patient Details  Name: Haley Trevino MRN: [id number] Date of Birth: [birth year] No Data Recorded  Encounter Date: 05/29/2015      PT End of Session - 05/29/15 1143    Visit Number 8   Number of Visits 12   Date for PT Re-Evaluation 05/29/15   PT Start Time 1115   PT Stop Time 1200   PT Time Calculation (min) 45 min      Past Medical History  Diagnosis Date  . Raynaud disease   . Scleroderma ([place])   . Osteoarthritis   . Dyspnea   . Thyroid disease     hypothyroidism  . Cataract     Past Surgical History  Procedure Laterality Date  . Cholecystectomy  1973  . Total abdominal hysterectomy  1974  . Breast enhancement surgery  1975  . Hand surgery  08/2012; 11/2012  . Melanoma excision      at 35 yrs of age  . Orif hip fracture Right 02/22/2014    Procedure: OPEN REDUCTION INTERNAL FIXATION RIGHT HIP;  Surgeon: Sanjuana Kava, MD;  Location: [facility];  Service: Orthopedics;  Laterality: Right;    There were no vitals filed for this visit.  Visit Diagnosis:  Right hip pain  Weakness of right hip      Subjective Assessment - 05/29/15 1124    Subjective RT hip and ITB feeling better   Limitations Walking   How long can you walk comfortably? 10-15 minutes.   Patient Stated Goals I want to get my right leg feeling better.   Currently in Pain? Yes   Pain Score 1    Pain Location Hip   Pain Orientation Right   Pain Descriptors / Indicators Aching   Pain Onset More than a month ago   Pain Frequency Intermittent   Aggravating Factors  prolonged activity                         [facility] Adult PT Treatment/Exercise - 05/29/15 0001    Exercises   Exercises Knee/Hip   Knee/Hip Exercises: Aerobic   Stationary Bike Level 2 x 15 minutes.   Manual Therapy   Manual Therapy Soft tissue  mobilization   Manual therapy comments Patient in left sdly position with pillows between knees for comfort while receiving STW/M x 24 minutes from RT hip and greater trochanter region and along the length of her left ITB.                  PT Short Term Goals - 04/17/15 1202    PT SHORT TERM GOAL #1   Title I with initial HEP   Time 2   Period Weeks           PT Long Term Goals - 05/29/15 1201    PT LONG TERM GOAL #4   Title Walk a community distance wiht pain not > 3/10.   Period Weeks   Status On-going   PT LONG TERM GOAL #5   Title Perform ADL's with pain not > 3/10.   Time 8   Period Weeks   Status Achieved               Plan - 05/29/15 1200    Clinical Impression Statement Pt did great again today with  exs and manual therapy. Overall she  is doing better and is pleased with progression. She is having less pain now and was able to meet LTG for ADLs, but not LTG for gait.   Pt will benefit from skilled therapeutic intervention in order to improve on the following deficits Abnormal gait;Pain;Decreased activity tolerance;Decreased strength   Rehab Potential Good   PT Frequency 3x / week   PT Duration 4 weeks   PT Treatment/Interventions ADLs/Self Care Home Management;Electrical Stimulation;Ultrasound;Moist Heat;Therapeutic exercise;Therapeutic activities;Patient/family education;Manual techniques   PT Next Visit Plan Stationary bike; right LE strengthening (especially right hip abdiuction and ER).     KX modifier  MC recert to finish 4 more visits and DC to [employer] program   PT Home Exercise Plan issue HEP next visit,    Consulted and Agree with Plan of Care Patient        Problem List Patient Active Problem List   Diagnosis Date Noted  . Impingement syndrome of left shoulder region 12/08/2014  . Osteoporosis 09/20/2014  . Hip fracture requiring operative repair ([place]) 02/22/2014  . Hypothyroidism 11/29/2013  . Depression 11/29/2013  . GERD  (gastroesophageal reflux disease) 11/29/2013  . Hypertension 11/29/2013  . Hyperlipidemia with target LDL less than 100 11/29/2013  . Thyroid cyst 04/26/2013  . MALIGNANT MELANOMA, SKIN 08/07/2010  . DYSPNEA 08/07/2010  . [name] 08/06/2010  . SCLERODERMA 08/06/2010  . OSTEOARTHRITIS 08/06/2010    RAMSEUR,CHRIS, PTA 05/29/2015, 3:28 PM  [facility] [address] [place], [place], [address] Phone: [phone]   Fax:  [phone]  Name: Haley Trevino MRN: [id number] Date of Birth: 15-Oct-1940

## 2015-05-30 NOTE — Telephone Encounter (Signed)
Last seen 04/18/15 M MM If approved route to nurse to call into [facility]

## 2015-05-30 NOTE — Telephone Encounter (Signed)
Forwarding to MMM

## 2015-06-01 NOTE — Telephone Encounter (Signed)
rx called into the pharmacy  

## 2015-06-01 NOTE — Telephone Encounter (Signed)
Please call in klonopin 0.5mg  1 po BID prn #60 with 1 refills

## 2015-06-05 ENCOUNTER — Ambulatory Visit: Payer: Medicare Other | Admitting: Physical Therapy

## 2015-06-05 ENCOUNTER — Encounter: Payer: Self-pay | Admitting: Physical Therapy

## 2015-06-05 DIAGNOSIS — M25551 Pain in right hip: Secondary | ICD-10-CM | POA: Diagnosis not present

## 2015-06-05 DIAGNOSIS — R29898 Other symptoms and signs involving the musculoskeletal system: Secondary | ICD-10-CM

## 2015-06-05 NOTE — Therapy (Signed)
[facility] [place], [place], [address] Phone: [phone]   Fax:  [phone]  Physical Therapy Treatment  Patient Details  Name: Haley Trevino MRN: [id number] Date of Birth: 1940-08-04 No Data Recorded  Encounter Date: 06/05/2015      PT End of Session - 06/05/15 1125    Visit Number 9   Number of Visits 12   Date for PT Re-Evaluation 05/29/15   PT Start Time 1115   PT Stop Time 1200   PT Time Calculation (min) 45 min   Activity Tolerance Patient tolerated treatment well;Patient limited by fatigue   Behavior During Therapy [facility] for tasks assessed/performed      Past Medical History  Diagnosis Date  . Raynaud disease   . Scleroderma ([place])   . Osteoarthritis   . Dyspnea   . Thyroid disease     hypothyroidism  . Cataract     Past Surgical History  Procedure Laterality Date  . Cholecystectomy  1973  . Total abdominal hysterectomy  1974  . Breast enhancement surgery  1975  . Hand surgery  08/2012; 11/2012  . Melanoma excision      at 38 yrs of age  . Orif hip fracture Right 02/22/2014    Procedure: OPEN REDUCTION INTERNAL FIXATION RIGHT HIP;  Surgeon: Sanjuana Kava, MD;  Location: [facility];  Service: Orthopedics;  Laterality: Right;    There were no vitals filed for this visit.  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   Limitations Walking   How long can you walk comfortably? 10-15 minutes.   Diagnostic tests xray   Patient Stated Goals I want to get my right leg feeling better.            [facility] PT Assessment - 06/05/15 0001    Assessment   Medical Diagnosis Hip flexor tendinits, right.                     [facility] Adult PT Treatment/Exercise - 06/05/15 0001    Knee/Hip Exercises: Stretches   Other  Knee/Hip Stretches R SKTC stretch 5 x10 sec   Knee/Hip Exercises: Aerobic   Stationary Bike L2 x70min   Nustep L6 x10 min   Knee/Hip Exercises: Standing   Lateral Step Up Right;3 sets;10 reps;Step Height: 6"   Forward Step Up Right;3 sets;10 reps;Step Height: 6"   Knee/Hip Exercises: Supine   Bridges Strengthening;3 sets;10 reps   Straight Leg Raise with External Rotation Strengthening;Right;3 sets;10 reps   Knee/Hip Exercises: Sidelying   Hip ABduction Strengthening;Right;3 sets;10 reps   Clams 3x10   Knee/Hip Exercises: Prone   Hip Extension Strengthening;Right;10 reps;2 sets                PT Education - 06/05/15 1212    Education provided Yes   Education Details HEP- bridging, hip abduction, hip extension with VCs to rest if she becomes fatigued.   Person(s) Educated Patient   Methods Explanation;Verbal cues;Handout   Comprehension Verbalized understanding;Verbal cues required          PT Short Term Goals - 04/17/15 1202    PT SHORT TERM GOAL #1   Title I with initial HEP   Time 2   Period Weeks  PT Long Term Goals - 06/05/15 1131    PT LONG TERM GOAL #1   Title  I with advanced HEP   Time 6   Period Weeks   Status On-going   PT LONG TERM GOAL #2   Title Walk without deviation.   Time 6   Period Weeks   Status Achieved   PT LONG TERM GOAL #3   Title improved left shoulder motion to be able to drive and undress   Time 8   Period Weeks   Status Achieved   PT LONG TERM GOAL #4   Title Walk a community distance wiht pain not > 3/10.   Time 6   Period Weeks   Status Achieved  Ambulate after [employer] with 1-2/10 but is more fatigued per patient report   PT LONG TERM GOAL #5   Title Perform ADL's with pain not > 3/10.   Time 8   Period Weeks   Status Achieved               Plan - 06/05/15 1208    Clinical Impression Statement Patient tolerated today's treatment well with hip exercises although she complained of fatigue  predominately during today's treatment. Patient completed exercises with moderate multimodal cueing to ensure proper exercise techninque. Patient has achieved all goals set at evaluation other than HEP goals. Accepted new hip strengthening HEP today and verbalized understanding. Complained of minimal "pull" around the R femoral head region and indicated posterior to the femoral head into buttocks. Experienced 2-3/10 R hip ache following today's treatment.   Pt will benefit from skilled therapeutic intervention in order to improve on the following deficits Abnormal gait;Pain;Decreased activity tolerance;Decreased strength   Rehab Potential Good   PT Frequency 3x / week   PT Duration 4 weeks   PT Treatment/Interventions ADLs/Self Care Home Management;Electrical Stimulation;Ultrasound;Moist Heat;Therapeutic exercise;Therapeutic activities;Patient/family education;Manual techniques   PT Next Visit Plan Stationary bike; right LE strengthening (especially right hip abdiuction and ER).     KX modifier  MC recert to finish 4 more visits and DC to [employer] and Agree with Plan of Care Patient        Problem List Patient Active Problem List   Diagnosis Date Noted  . Impingement syndrome of left shoulder region 12/08/2014  . Osteoporosis 09/20/2014  . Hip fracture requiring operative repair ([place]) 02/22/2014  . Hypothyroidism 11/29/2013  . Depression 11/29/2013  . GERD (gastroesophageal reflux disease) 11/29/2013  . Hypertension 11/29/2013  . Hyperlipidemia with target LDL less than 100 11/29/2013  . Thyroid cyst 04/26/2013  . MALIGNANT MELANOMA, SKIN 08/07/2010  . DYSPNEA 08/07/2010  . [name] 08/06/2010  . SCLERODERMA 08/06/2010  . OSTEOARTHRITIS 08/06/2010    Wynelle Fanny, PTA 06/05/2015, 12:13 PM  [facility] [address] [place], [place], [address] Phone: [phone]   Fax:  [phone]  Name: Haley Trevino MRN: [id number] Date of Birth: 07/13/1940

## 2015-06-05 NOTE — Patient Instructions (Signed)
Bridging    Slowly raise buttocks from floor, keeping stomach tight. Repeat __10__ times per set. Do _3___ sets per session. Do _2-3___ sessions per day.  http://orth.exer.us/1096   Copyright  VHI. All rights reserved.  Strengthening: Hip Abduction (Side-Lying)    Tighten muscles on front of left thigh, then lift leg ____ inches from surface, keeping knee locked.  Repeat __10__ times per set. Do _3___ sets per session. Do _2-3___ sessions per day.  http://orth.exer.us/622   Copyright  VHI. All rights reserved.  Strengthening: Hip Extension (Prone)    Tighten muscles on front of left thigh, then lift leg ____ inches from surface, keeping knee locked. Repeat _10___ times per set. Do __3__ sets per session. Do __2-3__ sessions per day.  http://orth.exer.us/620   Copyright  VHI. All rights reserved.

## 2015-06-07 ENCOUNTER — Ambulatory Visit: Payer: Medicare Other | Attending: Orthopedic Surgery | Admitting: Physical Therapy

## 2015-06-07 DIAGNOSIS — R29898 Other symptoms and signs involving the musculoskeletal system: Secondary | ICD-10-CM

## 2015-06-07 DIAGNOSIS — M25551 Pain in right hip: Secondary | ICD-10-CM | POA: Insufficient documentation

## 2015-06-07 NOTE — Therapy (Signed)
[facility] [place], [place], [address] Phone: [phone]   Fax:  [phone]  Physical Therapy Treatment  Patient Details  Name: Haley Trevino MRN: [id number] Date of Birth: 1940/08/13 No Data Recorded  Encounter Date: 06/07/2015      PT End of Session - 06/07/15 1409    Visit Number 10   Number of Visits 12   Date for PT Re-Evaluation 06/28/15   PT Start Time 1115   PT Stop Time 1201   PT Time Calculation (min) 46 min   Activity Tolerance Patient tolerated treatment well;Patient limited by fatigue   Behavior During Therapy [facility] for tasks assessed/performed      Past Medical History  Diagnosis Date  . Raynaud disease   . Scleroderma ([place])   . Osteoarthritis   . Dyspnea   . Thyroid disease     hypothyroidism  . Cataract     Past Surgical History  Procedure Laterality Date  . Cholecystectomy  1973  . Total abdominal hysterectomy  1974  . Breast enhancement surgery  1975  . Hand surgery  08/2012; 11/2012  . Melanoma excision      at 44 yrs of age  . Orif hip fracture Right 02/22/2014    Procedure: OPEN REDUCTION INTERNAL FIXATION RIGHT HIP;  Surgeon: Sanjuana Kava, MD;  Location: [facility];  Service: Orthopedics;  Laterality: Right;    There were no vitals filed for this visit.  Visit Diagnosis:  Right hip pain  Weakness of right hip      Subjective Assessment - 06/07/15 1307    Subjective I'm at least 50% better overall.  I stood a long time at a funeral yesterday and my right hip is very sore.  patient requesting STW today.   Limitations Walking   How long can you walk comfortably? 10-15 minutes.   Diagnostic tests xray   Patient Stated Goals I want to get my right leg feeling better.   Pain Score 4    Pain Location Hip   Pain Orientation Right   Pain Descriptors / Indicators Aching;Throbbing   Pain Onset More than a month ago                         [facility] Adult PT  Treatment/Exercise - 06/07/15 0001    Knee/Hip Exercises: Aerobic   Stationary Bike L2 x 10 minutes.   Manual Therapy   Manual therapy comments Patient in left sdly position with pillows between knees while receiving STW/M and IASTM to left posterior hip region and along left ITB x 30 minutes.                  PT Short Term Goals - 04/17/15 1202    PT SHORT TERM GOAL #1   Title I with initial HEP   Time 2   Period Weeks           PT Long Term Goals - 06/05/15 1131    PT LONG TERM GOAL #1   Title  I with advanced HEP   Time 6   Period Weeks   Status On-going   PT LONG TERM GOAL #2   Title Walk without deviation.   Time 6   Period Weeks   Status Achieved   PT LONG TERM GOAL #3   Title improved left shoulder motion to be able to drive and undress   Time 8   Period Weeks   Status Achieved  PT LONG TERM GOAL #4   Title Walk a community distance wiht pain not > 3/10.   Time 6   Period Weeks   Status Achieved  Ambulate after [employer] with 1-2/10 but is more fatigued per patient report   PT LONG TERM GOAL #5   Title Perform ADL's with pain not > 3/10.   Time 8   Period Weeks   Status Achieved                 G-Codes - June 30, 2015 1409    Functional Assessment Tool Used Clinical judgement.   Functional Limitation Mobility: Walking and moving around   Mobility: Walking and Moving Around Current Status [phone]) At least 20 percent but less than 40 percent impaired, limited or restricted      Problem List Patient Active Problem List   Diagnosis Date Noted  . Impingement syndrome of left shoulder region 12/08/2014  . Osteoporosis 09/20/2014  . Hip fracture requiring operative repair ([place]) 02/22/2014  . Hypothyroidism 11/29/2013  . Depression 11/29/2013  . GERD (gastroesophageal reflux disease) 11/29/2013  . Hypertension 11/29/2013  . Hyperlipidemia with target LDL less than 100 11/29/2013  . Thyroid cyst 04/26/2013  . MALIGNANT MELANOMA, SKIN  08/07/2010  . DYSPNEA 08/07/2010  . [name] 08/06/2010  . SCLERODERMA 08/06/2010  . OSTEOARTHRITIS 08/06/2010    Emery Dupuy, [place] MPT Jun 30, 2015, 2:15 PM  [facility] [address] [place], [place], [address] Phone: [phone]   Fax:  [phone]  Name: KIYLA POTTHAST MRN: [id number] Date of Birth: 1941-04-30

## 2015-06-12 ENCOUNTER — Ambulatory Visit: Payer: Medicare Other | Admitting: *Deleted

## 2015-06-12 DIAGNOSIS — R29898 Other symptoms and signs involving the musculoskeletal system: Secondary | ICD-10-CM | POA: Diagnosis not present

## 2015-06-12 DIAGNOSIS — M25551 Pain in right hip: Secondary | ICD-10-CM

## 2015-06-12 NOTE — Therapy (Signed)
[facility] [place], [place], [address] Phone: [phone]   Fax:  [phone]  Physical Therapy Treatment  Patient Details  Name: Haley Trevino MRN: [id number] Date of Birth: 10/24/1940 No Data Recorded  Encounter Date: 06/12/2015      PT End of Session - 06/12/15 1308    Visit Number 11   Date for PT Re-Evaluation 06/28/15   PT Start Time 1300   PT Stop Time 1351   PT Time Calculation (min) 51 min      Past Medical History  Diagnosis Date  . Raynaud disease   . Scleroderma ([place])   . Osteoarthritis   . Dyspnea   . Thyroid disease     hypothyroidism  . Cataract     Past Surgical History  Procedure Laterality Date  . Cholecystectomy  1973  . Total abdominal hysterectomy  1974  . Breast enhancement surgery  1975  . Hand surgery  08/2012; 11/2012  . Melanoma excision      at 39 yrs of age  . Orif hip fracture Right 02/22/2014    Procedure: OPEN REDUCTION INTERNAL FIXATION RIGHT HIP;  Surgeon: Sanjuana Kava, MD;  Location: [facility];  Service: Orthopedics;  Laterality: Right;    There were no vitals filed for this visit.  Visit Diagnosis:  Right hip pain  Weakness of right hip      Subjective Assessment - 06/12/15 1305    Subjective I'm at least 50% better overall.  I stood a long time at a funeral yesterday and my right hip is very sore.  patient requesting STW today.   Limitations Walking   How long can you walk comfortably? 10-15 minutes.   Diagnostic tests xray   Patient Stated Goals I want to get my right leg feeling better.   Currently in Pain? Yes   Pain Score 4    Pain Location Hip   Pain Orientation Right   Pain Descriptors / Indicators Aching   Pain Onset More than a month ago   Pain Frequency Intermittent   Aggravating Factors  prolonged   Effect of Pain on Daily Activities rest                         [facility] Adult PT Treatment/Exercise - 06/12/15 0001    Exercises    Exercises Knee/Hip   Knee/Hip Exercises: Standing   Lateral Step Up Right;3 sets;10 reps;Step Height: 6"   Forward Step Up Right;3 sets;10 reps;Step Height: 6"   Rocker Board 3 minutes   Manual Therapy   Manual therapy comments Patient in left sdly position with pillows between knees while receiving STW/M and IASTM to left posterior hip region and along left ITB x 30 minutes.                  PT Short Term Goals - 04/17/15 1202    PT SHORT TERM GOAL #1   Title I with initial HEP   Time 2   Period Weeks           PT Long Term Goals - 06/05/15 1131    PT LONG TERM GOAL #1   Title  I with advanced HEP   Time 6   Period Weeks   Status On-going   PT LONG TERM GOAL #2   Title Walk without deviation.   Time 6   Period Weeks   Status Achieved   PT LONG TERM GOAL #3  Title improved left shoulder motion to be able to drive and undress   Time 8   Period Weeks   Status Achieved   PT LONG TERM GOAL #4   Title Walk a community distance wiht pain not > 3/10.   Time 6   Period Weeks   Status Achieved  Ambulate after [employer] with 1-2/10 but is more fatigued per patient report   PT LONG TERM GOAL #5   Title Perform ADL's with pain not > 3/10.   Time 8   Period Weeks   Status Achieved               Plan - 06/12/15 1308    Clinical Impression Statement Pt did great again with rx and feels 50% better overall. She has 1 more Rx and will be DC to HEP and Gym program   Pt will benefit from skilled therapeutic intervention in order to improve on the following deficits Abnormal gait;Pain;Decreased activity tolerance;Decreased strength   Rehab Potential Good   PT Frequency 3x / week   PT Duration 4 weeks   PT Treatment/Interventions ADLs/Self Care Home Management;Electrical Stimulation;Ultrasound;Moist Heat;Therapeutic exercise;Therapeutic activities;Patient/family education;Manual techniques   PT Next Visit Plan Stationary bike; right LE strengthening (especially  right hip abdiuction and ER).     KX modifier  MC recert to finish 4 more visits and DC to [employer] program   PT Home Exercise Plan issue HEP next visit,    Consulted and Agree with Plan of Care Patient        Problem List Patient Active Problem List   Diagnosis Date Noted  . Impingement syndrome of left shoulder region 12/08/2014  . Osteoporosis 09/20/2014  . Hip fracture requiring operative repair ([place]) 02/22/2014  . Hypothyroidism 11/29/2013  . Depression 11/29/2013  . GERD (gastroesophageal reflux disease) 11/29/2013  . Hypertension 11/29/2013  . Hyperlipidemia with target LDL less than 100 11/29/2013  . Thyroid cyst 04/26/2013  . MALIGNANT MELANOMA, SKIN 08/07/2010  . DYSPNEA 08/07/2010  . [name] 08/06/2010  . SCLERODERMA 08/06/2010  . OSTEOARTHRITIS 08/06/2010    RAMSEUR,CHRIS, PTA 06/12/2015, 1:51 PM  [facility] [place], [place], [address] Phone: [phone]   Fax:  [phone]  Name: Haley Trevino MRN: [id number] Date of Birth: 1940/11/17

## 2015-06-14 DIAGNOSIS — I7300 Raynaud's syndrome without gangrene: Secondary | ICD-10-CM | POA: Diagnosis not present

## 2015-06-14 DIAGNOSIS — M150 Primary generalized (osteo)arthritis: Secondary | ICD-10-CM | POA: Diagnosis not present

## 2015-06-14 DIAGNOSIS — M810 Age-related osteoporosis without current pathological fracture: Secondary | ICD-10-CM | POA: Diagnosis not present

## 2015-06-14 DIAGNOSIS — M340 Progressive systemic sclerosis: Secondary | ICD-10-CM | POA: Diagnosis not present

## 2015-06-14 DIAGNOSIS — K219 Gastro-esophageal reflux disease without esophagitis: Secondary | ICD-10-CM | POA: Diagnosis not present

## 2015-06-19 ENCOUNTER — Ambulatory Visit: Payer: Medicare Other | Admitting: *Deleted

## 2015-06-19 ENCOUNTER — Encounter: Payer: Self-pay | Admitting: *Deleted

## 2015-06-19 DIAGNOSIS — M25551 Pain in right hip: Secondary | ICD-10-CM

## 2015-06-19 DIAGNOSIS — R29898 Other symptoms and signs involving the musculoskeletal system: Secondary | ICD-10-CM | POA: Diagnosis not present

## 2015-06-19 NOTE — Therapy (Signed)
[facility] [place], [place], [address] Phone: [phone]   Fax:  [phone]  Physical Therapy Treatment  Patient Details  Name: Haley Trevino MRN: [id number] Date of Birth: 1941/03/18 No Data Recorded  Encounter Date: 06/19/2015      PT End of Session - 06/19/15 1136    Visit Number 12   Number of Visits 12   Date for PT Re-Evaluation 06/28/15   PT Start Time 1115   PT Stop Time 1200   PT Time Calculation (min) 45 min      Past Medical History  Diagnosis Date  . Raynaud disease   . Scleroderma ([place])   . Osteoarthritis   . Dyspnea   . Thyroid disease     hypothyroidism  . Cataract     Past Surgical History  Procedure Laterality Date  . Cholecystectomy  1973  . Total abdominal hysterectomy  1974  . Breast enhancement surgery  1975  . Hand surgery  08/2012; 11/2012  . Melanoma excision      at 47 yrs of age  . Orif hip fracture Right 02/22/2014    Procedure: OPEN REDUCTION INTERNAL FIXATION RIGHT HIP;  Surgeon: Sanjuana Kava, MD;  Location: [facility];  Service: Orthopedics;  Laterality: Right;    There were no vitals filed for this visit.  Visit Diagnosis:  Right hip pain  Weakness of right hip      Subjective Assessment - 06/19/15 1115    Subjective RT hip has constant ache 2/10. Ready to DC and join [employer]. 50% better overall   Limitations Walking   How long can you walk comfortably? 30 minutes or longer   Currently in Pain? Yes   Pain Score 2    Pain Location Hip   Pain Orientation Right   Pain Descriptors / Indicators Aching   Pain Onset More than a month ago   Pain Frequency Constant   Aggravating Factors  sitting too long then trying to stand                         [facility] Adult PT Treatment/Exercise - 06/19/15 0001    Exercises   Exercises Knee/Hip   Knee/Hip Exercises: Aerobic   Stationary Bike L2 x 5 minutes.   Nustep L6 x10 min   Knee/Hip Exercises:  Standing   Lateral Step Up Right;3 sets;10 reps;Step Height: 6"   Forward Step Up Right;3 sets;10 reps;Step Height: 6"   Rocker Board 3 minutes   Manual Therapy   Manual therapy comments Patient in left sdly position with pillows between knees while receiving STW/M and IASTM to left posterior hip region and along left ITB                   PT Short Term Goals - 04/17/15 1202    PT SHORT TERM GOAL #1   Title I with initial HEP   Time 2   Period Weeks           PT Long Term Goals - 06/19/15 1125    PT LONG TERM GOAL #1   Title  I with advanced HEP   Period Weeks   Status Achieved   PT LONG TERM GOAL #2   Title Walk without deviation.   Time 6   Period Weeks   Status Achieved   PT LONG TERM GOAL #4   Title Walk a community distance wiht pain not > 3/10.  Time 6   Period Weeks   Status Achieved   PT LONG TERM GOAL #5   Title Perform ADL's with pain not > 3/10.   Time 8   Period Weeks   Status Achieved               Plan - 06/19/15 1201    Clinical Impression Statement Pt did great with Rx and is independent with GYM set up and exs. All goals were met.   Pt will benefit from skilled therapeutic intervention in order to improve on the following deficits Abnormal gait;Pain;Decreased activity tolerance;Decreased strength   Rehab Potential Good   PT Frequency 3x / week   PT Duration 4 weeks   PT Treatment/Interventions ADLs/Self Care Home Management;Electrical Stimulation;Ultrasound;Moist Heat;Therapeutic exercise;Therapeutic activities;Patient/family education;Manual techniques   PT Next Visit Plan DC to [place] and Agree with Plan of Care Patient        Problem List Patient Active Problem List   Diagnosis Date Noted  . Impingement syndrome of left shoulder region 12/08/2014  . Osteoporosis 09/20/2014  . Hip fracture requiring operative repair ([place]) 02/22/2014  . Hypothyroidism 11/29/2013  .  Depression 11/29/2013  . GERD (gastroesophageal reflux disease) 11/29/2013  . Hypertension 11/29/2013  . Hyperlipidemia with target LDL less than 100 11/29/2013  . Thyroid cyst 04/26/2013  . MALIGNANT MELANOMA, SKIN 08/07/2010  . DYSPNEA 08/07/2010  . [name] 08/06/2010  . SCLERODERMA 08/06/2010  . OSTEOARTHRITIS 08/06/2010    Lipa Knauff,CHRIS, PTA 06/19/2015, 12:07 PM  [facility] [place], [place], [address] Phone: [phone]   Fax:  [phone]  Name: Haley Trevino MRN: [id number] Date of Birth: Dec 22, 1940

## 2015-06-20 DIAGNOSIS — M7542 Impingement syndrome of left shoulder: Secondary | ICD-10-CM | POA: Diagnosis not present

## 2015-06-23 NOTE — Therapy (Signed)
[facility] [place], [place], [address] Phone: [phone]   Fax:  [phone]  Physical Therapy Treatment  Patient Details  Name: Haley Trevino MRN: [id number] Date of Birth: 03-31-1941 No Data Recorded  Encounter Date: 06/19/2015    Past Medical History  Diagnosis Date  . Raynaud disease   . Scleroderma ([place])   . Osteoarthritis   . Dyspnea   . Thyroid disease     hypothyroidism  . Cataract     Past Surgical History  Procedure Laterality Date  . Cholecystectomy  1973  . Total abdominal hysterectomy  1974  . Breast enhancement surgery  1975  . Hand surgery  08/2012; 11/2012  . Melanoma excision      at 34 yrs of age  . Orif hip fracture Right 02/22/2014    Procedure: OPEN REDUCTION INTERNAL FIXATION RIGHT HIP;  Surgeon: Sanjuana Kava, MD;  Location: [facility];  Service: Orthopedics;  Laterality: Right;    There were no vitals filed for this visit.  Visit Diagnosis:  Right hip pain  Weakness of right hip                                 PT Short Term Goals - 04/17/15 1202    PT SHORT TERM GOAL #1   Title I with initial HEP   Time 2   Period Weeks           PT Long Term Goals - 06/19/15 1125    PT LONG TERM GOAL #1   Title  I with advanced HEP   Period Weeks   Status Achieved   PT LONG TERM GOAL #2   Title Walk without deviation.   Time 6   Period Weeks   Status Achieved   PT LONG TERM GOAL #4   Title Walk a community distance wiht pain not > 3/10.   Time 6   Period Weeks   Status Achieved   PT LONG TERM GOAL #5   Title Perform ADL's with pain not > 3/10.   Time 8   Period Weeks   Status Achieved                 G-Codes - 07-10-2015 1651    Functional Assessment Tool Used Clinical judgement.   Functional Limitation Mobility: Walking and moving around   Mobility: Walking and Moving Around Current Status [phone]) At least 20 percent but less than 40  percent impaired, limited or restricted   Mobility: Walking and Moving Around Goal Status [phone]) At least 20 percent but less than 40 percent impaired, limited or restricted   Mobility: Walking and Moving Around Discharge Status [phone]) At least 20 percent but less than 40 percent impaired, limited or restricted      Problem List Patient Active Problem List   Diagnosis Date Noted  . Impingement syndrome of left shoulder region 12/08/2014  . Osteoporosis 09/20/2014  . Hip fracture requiring operative repair ([place]) 02/22/2014  . Hypothyroidism 11/29/2013  . Depression 11/29/2013  . GERD (gastroesophageal reflux disease) 11/29/2013  . Hypertension 11/29/2013  . Hyperlipidemia with target LDL less than 100 11/29/2013  . Thyroid cyst 04/26/2013  . MALIGNANT MELANOMA, SKIN 08/07/2010  . DYSPNEA 08/07/2010  . [name] 08/06/2010  . SCLERODERMA 08/06/2010  . OSTEOARTHRITIS 08/06/2010   PHYSICAL THERAPY DISCHARGE SUMMARY  Visits from Start of Care:   Current functional level related to goals /  functional outcomes: Please see above.   Remaining deficits: All goals met.   Education / Equipment: HEP. Plan: Patient agrees to discharge.  Patient goals were met. Patient is being discharged due to meeting the stated rehab goals.  ?????       Crystalmarie Yasin, [place]  MPT  06/23/2015, 4:52 PM  [facility] [address] [place], [place], [address] Phone: [phone]   Fax:  [phone]  Name: Haley Trevino MRN: [id number] Date of Birth: 1941/04/28

## 2015-07-06 ENCOUNTER — Other Ambulatory Visit: Payer: Self-pay | Admitting: Nurse Practitioner

## 2015-07-06 NOTE — Telephone Encounter (Signed)
rx called in

## 2015-07-06 NOTE — Telephone Encounter (Signed)
Please call in klonopin with 1 refills 

## 2015-07-06 NOTE — Telephone Encounter (Signed)
Last filled 06/01/15, last seen 04/18/15. Call in at [facility]

## 2015-07-20 ENCOUNTER — Encounter: Payer: Self-pay | Admitting: Nurse Practitioner

## 2015-07-20 ENCOUNTER — Ambulatory Visit (INDEPENDENT_AMBULATORY_CARE_PROVIDER_SITE_OTHER): Payer: Medicare Other | Admitting: Nurse Practitioner

## 2015-07-20 VITALS — BP 142/59 | HR 94 | Temp 97.7°F | Ht 61.0 in | Wt 115.0 lb

## 2015-07-20 DIAGNOSIS — E785 Hyperlipidemia, unspecified: Secondary | ICD-10-CM | POA: Diagnosis not present

## 2015-07-20 DIAGNOSIS — E039 Hypothyroidism, unspecified: Secondary | ICD-10-CM | POA: Diagnosis not present

## 2015-07-20 DIAGNOSIS — K219 Gastro-esophageal reflux disease without esophagitis: Secondary | ICD-10-CM | POA: Diagnosis not present

## 2015-07-20 DIAGNOSIS — I7301 Raynaud's syndrome with gangrene: Secondary | ICD-10-CM

## 2015-07-20 DIAGNOSIS — M349 Systemic sclerosis, unspecified: Secondary | ICD-10-CM

## 2015-07-20 DIAGNOSIS — D539 Nutritional anemia, unspecified: Secondary | ICD-10-CM

## 2015-07-20 DIAGNOSIS — F329 Major depressive disorder, single episode, unspecified: Secondary | ICD-10-CM | POA: Diagnosis not present

## 2015-07-20 DIAGNOSIS — I10 Essential (primary) hypertension: Secondary | ICD-10-CM

## 2015-07-20 DIAGNOSIS — F32A Depression, unspecified: Secondary | ICD-10-CM

## 2015-07-20 MED ORDER — CLONAZEPAM 0.5 MG PO TABS: ORAL_TABLET | ORAL | Status: DC

## 2015-07-20 NOTE — Progress Notes (Addendum)
 Subjective:    Patient ID: Haley Trevino, female    DOB: 06/02/1941, 75 y.o.   MRN: [id number]  HPI  Patient here today for follow up of chronic medical problems. Patient is no better then she was at last visit. She says she is very weak. Has poor appetitie. Does not like meat so she does not eat a lot of protein.  Outpatient Encounter Prescriptions as of 07/20/2015  Medication Sig  . clonazePAM (KLONOPIN) 0.5 MG tablet TAKE 1 TABLET TWICE DAILY AS NEEDED FOR ANXIETY  . Ferrous Sulfate Dried (SLOW RELEASE IRON) 45 MG TBCR Take 1 tablet by mouth daily.  . FLUoxetine (PROZAC) 40 MG capsule Take 1 capsule (40 mg total) by mouth daily.  . NIFEdipine (PROCARDIA XL/ADALAT-CC) 90 MG 24 hr tablet Take 1 tablet (90 mg total) by mouth daily.  . omeprazole (PRILOSEC) 20 MG capsule Take 1 capsule (20 mg total) by mouth daily.  . Probiotic Product (PROBIOTIC DAILY PO) Take 1 capsule by mouth daily.  . sildenafil (REVATIO) 20 MG tablet Take 20 mg by mouth daily as needed (not prn).   . SYNTHROID 50 MCG tablet Take 1 tablet (50 mcg total) by mouth daily before breakfast.  . Fe Fum-FA-B Cmp-C-Zn-Mg-Mn-Cu (HEMOCYTE PLUS) 106-1 MG CAPS Take 1 tablet by mouth daily. (Patient not taking: Reported on 07/20/2015)  . ondansetron (ZOFRAN) 4 MG tablet Take 1 tablet (4 mg total) by mouth every 8 (eight) hours as needed for nausea or vomiting. (Patient not taking: Reported on 07/20/2015)  . [DISCONTINUED] naproxen sodium (ANAPROX) 220 MG tablet Take 1 tablet by mouth 2 (two) times daily as needed. Reported on 07/20/2015   No facility-administered encounter medications on file as of 07/20/2015.    Sclerderma Getting worse- is being referred to [facility]- starting to get ulcers on fingers that get infected. Currently on doxy for finger infections- doesn't know how long she will be taking this. Having trouble walking due to so much pain. Raynauds Still has bad nerve pain and hurts to just touch anything. Has started on  lyrica to help with nerve pain- could not tolerate gabapentin because it caused swelling. Has minimal swelling for lyrica but not like she had with gabapentin Hypothyroidism SYnthroid 50mcg and doing well with that. Has fatigue but she thinks that is from everything nit just thyroid problems. Depression Decided to take a break from it because she has been on it so long- Sees psychiatrist next tomorrow and will probably start back on  It tomorrow. Hyperlipidemia-  Welchol is the only thing she can tolerate- statins make her hurt Hypertension On procardia which helps greatly with blood pressure and raynauds. Gerd Omeprazole works well to keep symptoms under control. Right hip fracture August 2015- has had PT but still has pain and is limping. Very unsteady and has fallen 2 x in the last several months    Review of Systems  Constitutional: Negative.   HENT: Negative.   Respiratory: Negative.   Cardiovascular: Negative.   Gastrointestinal: Negative.   Genitourinary: Negative.   Neurological: Negative.   Psychiatric/Behavioral: Negative.   All other systems reviewed and are negative.      Objective:   Physical Exam  Constitutional: She is oriented to person, place, and time. She appears well-developed and well-nourished.  HENT:  Nose: Nose normal.  Mouth/Throat: Oropharynx is clear and moist.  Eyes: EOM are normal.  Neck: Trachea normal, normal range of motion and full passive range of motion without pain. Neck supple. No   JVD present. Carotid bruit is not present. No thyromegaly present.  Cardiovascular: Normal rate, regular rhythm, normal heart sounds and intact distal pulses.  Exam reveals no gallop and no friction rub.   No murmur heard. Tips of fingers are cold to touch with slow cap refill. Palms of hands are paler then they have been  Pulmonary/Chest: Effort normal and breath sounds normal.  Abdominal: Soft. Bowel sounds are normal. She exhibits no distension and no mass.  There is no tenderness.  Musculoskeletal: Normal range of motion.  limps with gait but improving  Lymphadenopathy:    She has no cervical adenopathy.  Neurological: She is alert and oriented to person, place, and time. She has normal reflexes.  Skin: Skin is warm and dry.  Psychiatric: She has a normal mood and affect. Her behavior is normal. Judgment and thought content normal.    BP 142/59 mmHg  Pulse 94  Temp(Src) 97.7 F (36.5 C) (Oral)  Ht 5' 1" (1.549 m)  Wt 115 lb (52.164 kg)  BMI 21.74 kg/m2         Assessment & Plan:  1. [name] with gangrene (HCC) Keep follow up with  specialist  2. Essential hypertension Do not add slat to diet - CMP14+EGFR  3. Gastroesophageal reflux disease without esophagitis Avoid spicy foods Do not eat 2 hours prior to bedtime  4. Hypothyroidism, unspecified hypothyroidism type  5. SCLERODERMA Keep follow up with rheumatologist  6. Depression Stress management - clonazePAM (KLONOPIN) 0.5 MG tablet; TAKE 1 TABLET TWICE DAILY AS NEEDED FOR ANXIETY  Dispense: 60 tablet; Refill: 1  7. Hyperlipidemia with target LDL less than 100 Low fat diet - Lipid panel  8. Anemia associated with nutritional deficiency, unspecified nutritional anemia type Waiting on labs - Anemia Profile B   Add protein shake to diet daily Labs pending Health maintenance reviewed Diet and exercise encouraged Continue all meds Follow up  In 3 months   Mary-Margaret Martin, FNP    

## 2015-07-20 NOTE — Patient Instructions (Signed)
Stress and Stress Management Stress is a normal reaction to life events. It is what you feel when life demands more than you are used to or more than you can handle. Some stress can be useful. For example, the stress reaction can help you catch the last bus of the day, study for a test, or meet a deadline at work. But stress that occurs too often or for too long can cause problems. It can affect your emotional health and interfere with relationships and normal daily activities. Too much stress can weaken your immune system and increase your risk for physical illness. If you already have a medical problem, stress can make it worse. CAUSES  All sorts of life events may cause stress. An event that causes stress for one person may not be stressful for another person. Major life events commonly cause stress. These may be positive or negative. Examples include losing your job, moving into a new home, getting married, having a baby, or losing a loved one. Less obvious life events may also cause stress, especially if they occur day after day or in combination. Examples include working long hours, driving in traffic, caring for children, being in debt, or being in a difficult relationship. SIGNS AND SYMPTOMS Stress may cause emotional symptoms including, the following:  Anxiety. This is feeling worried, afraid, on edge, overwhelmed, or out of control.  Anger. This is feeling irritated or impatient.  Depression. This is feeling sad, down, helpless, or guilty.  Difficulty focusing, remembering, or making decisions. Stress may cause physical symptoms, including the following:   Aches and pains. These may affect your head, neck, back, stomach, or other areas of your body.  Tight muscles or clenched jaw.  Low energy or trouble sleeping. Stress may cause unhealthy behaviors, including the following:   Eating to feel better (overeating) or skipping meals.  Sleeping too little, too much, or both.  Working  too much or putting off tasks (procrastination).  Smoking, drinking alcohol, or using drugs to feel better. DIAGNOSIS  Stress is diagnosed through an assessment by your health care provider. Your health care provider will ask questions about your symptoms and any stressful life events.Your health care provider will also ask about your medical history and may order blood tests or other tests. Certain medical conditions and medicine can cause physical symptoms similar to stress. Mental illness can cause emotional symptoms and unhealthy behaviors similar to stress. Your health care provider may refer you to a mental health professional for further evaluation.  TREATMENT  Stress management is the recommended treatment for stress.The goals of stress management are reducing stressful life events and coping with stress in healthy ways.  Techniques for reducing stressful life events include the following:  Stress identification. Self-monitor for stress and identify what causes stress for you. These skills may help you to avoid some stressful events.  Time management. Set your priorities, keep a calendar of events, and learn to say "no." These tools can help you avoid making too many commitments. Techniques for coping with stress include the following:  Rethinking the problem. Try to think realistically about stressful events rather than ignoring them or overreacting. Try to find the positives in a stressful situation rather than focusing on the negatives.  Exercise. Physical exercise can release both physical and emotional tension. The key is to find a form of exercise you enjoy and do it regularly.  Relaxation techniques. These relax the body and mind. Examples include yoga, meditation, tai chi, biofeedback, deep  breathing, progressive muscle relaxation, listening to music, being out in nature, journaling, and other hobbies. Again, the key is to find one or more that you enjoy and can do  regularly.  Healthy lifestyle. Eat a balanced diet, get plenty of sleep, and do not smoke. Avoid using alcohol or drugs to relax.  Strong support network. Spend time with family, friends, or other people you enjoy being around.Express your feelings and talk things over with someone you trust. Counseling or talktherapy with a mental health professional may be helpful if you are having difficulty managing stress on your own. Medicine is typically not recommended for the treatment of stress.Talk to your health care provider if you think you need medicine for symptoms of stress. HOME CARE INSTRUCTIONS  Keep all follow-up visits as directed by your health care provider.  Take all medicines as directed by your health care provider. SEEK MEDICAL CARE IF:  Your symptoms get worse or you start having new symptoms.  You feel overwhelmed by your problems and can no longer manage them on your own. SEEK IMMEDIATE MEDICAL CARE IF:  You feel like hurting yourself or someone else.   This information is not intended to replace advice given to you by your health care provider. Make sure you discuss any questions you have with your health care provider.   Document Released: 12/17/2000 Document Revised: 07/14/2014 Document Reviewed: 02/15/2013 Elsevier Interactive Patient Education 2016 [employer]  

## 2015-07-21 LAB — ANEMIA PROFILE B
BASOS ABS: 0 10*3/uL (ref 0.0–0.2)
Basos: 0 %
EOS (ABSOLUTE): 0.3 10*3/uL (ref 0.0–0.4)
Eos: 3 %
FOLATE: 7.5 ng/mL (ref >3.0)
Ferritin: 14 ng/mL — ABNORMAL LOW (ref 15–150)
Hematocrit: 35.7 % (ref 34.0–46.6)
Hemoglobin: 12.5 g/dL (ref 11.1–15.9)
IMMATURE GRANULOCYTES: 0 %
IRON: 40 ug/dL (ref 27–139)
Immature Grans (Abs): 0 10*3/uL (ref 0.0–0.1)
Iron Saturation: 14 % — ABNORMAL LOW (ref 15–55)
Lymphocytes Absolute: 1.9 10*3/uL (ref 0.7–3.1)
Lymphs: 21 %
MCH: 30.1 pg (ref 26.6–33.0)
MCHC: 35 g/dL (ref 31.5–35.7)
MCV: 86 fL (ref 79–97)
MONOCYTES: 7 %
Monocytes Absolute: 0.6 10*3/uL (ref 0.1–0.9)
NEUTROS PCT: 69 %
Neutrophils Absolute: 6.3 10*3/uL (ref 1.4–7.0)
PLATELETS: 382 10*3/uL — AB (ref 150–379)
RBC: 4.15 x10E6/uL (ref 3.77–5.28)
RDW: 16.5 % — ABNORMAL HIGH (ref 12.3–15.4)
RETIC CT PCT: 0.6 % (ref 0.6–2.6)
TIBC: 287 ug/dL (ref 250–450)
UIBC: 247 ug/dL (ref 118–369)
Vitamin B-12: 372 pg/mL (ref 211–946)
WBC: 9.1 10*3/uL (ref 3.4–10.8)

## 2015-07-21 LAB — LIPID PANEL
CHOLESTEROL TOTAL: 198 mg/dL (ref 100–199)
Chol/HDL Ratio: 5 ratio units — ABNORMAL HIGH (ref 0.0–4.4)
HDL: 40 mg/dL (ref >39)
LDL Calculated: 116 mg/dL — ABNORMAL HIGH (ref 0–99)
TRIGLYCERIDES: 209 mg/dL — AB (ref 0–149)
VLDL Cholesterol Cal: 42 mg/dL — ABNORMAL HIGH (ref 5–40)

## 2015-07-21 LAB — CMP14+EGFR
A/G RATIO: 1.5 (ref 1.1–2.5)
ALK PHOS: 65 IU/L (ref 39–117)
ALT: 7 IU/L (ref 0–32)
AST: 12 IU/L (ref 0–40)
Albumin: 4.1 g/dL (ref 3.5–4.8)
BUN / CREAT RATIO: 9 — AB (ref 11–26)
BUN: 7 mg/dL — ABNORMAL LOW (ref 8–27)
CHLORIDE: 103 mmol/L (ref 96–106)
CO2: 24 mmol/L (ref 18–29)
CREATININE: 0.75 mg/dL (ref 0.57–1.00)
Calcium: 8.8 mg/dL (ref 8.7–10.3)
GFR calc Af Amer: 91 mL/min/{1.73_m2} (ref >59)
GFR calc non Af Amer: 79 mL/min/{1.73_m2} (ref >59)
GLOBULIN, TOTAL: 2.7 g/dL (ref 1.5–4.5)
GLUCOSE: 95 mg/dL (ref 65–99)
POTASSIUM: 3.5 mmol/L (ref 3.5–5.2)
SODIUM: 142 mmol/L (ref 134–144)
Total Protein: 6.8 g/dL (ref 6.0–8.5)

## 2015-07-24 ENCOUNTER — Other Ambulatory Visit: Payer: Self-pay | Admitting: Nurse Practitioner

## 2015-07-24 MED ORDER — SIMVASTATIN 40 MG PO TABS: 40.0000 mg | ORAL_TABLET | Freq: Every day | ORAL | Status: DC

## 2015-10-11 DIAGNOSIS — M340 Progressive systemic sclerosis: Secondary | ICD-10-CM | POA: Diagnosis not present

## 2015-10-11 DIAGNOSIS — K219 Gastro-esophageal reflux disease without esophagitis: Secondary | ICD-10-CM | POA: Diagnosis not present

## 2015-10-11 DIAGNOSIS — M150 Primary generalized (osteo)arthritis: Secondary | ICD-10-CM | POA: Diagnosis not present

## 2015-10-11 DIAGNOSIS — M810 Age-related osteoporosis without current pathological fracture: Secondary | ICD-10-CM | POA: Diagnosis not present

## 2015-10-11 DIAGNOSIS — I7300 Raynaud's syndrome without gangrene: Secondary | ICD-10-CM | POA: Diagnosis not present

## 2015-11-26 ENCOUNTER — Ambulatory Visit: Payer: Medicare Other | Admitting: Orthopedic Surgery

## 2015-11-27 ENCOUNTER — Ambulatory Visit (INDEPENDENT_AMBULATORY_CARE_PROVIDER_SITE_OTHER): Payer: Medicare Other

## 2015-11-27 ENCOUNTER — Ambulatory Visit (INDEPENDENT_AMBULATORY_CARE_PROVIDER_SITE_OTHER): Payer: Medicare Other | Admitting: Orthopedic Surgery

## 2015-11-27 VITALS — BP 153/56 | HR 72 | Ht 62.0 in | Wt 108.0 lb

## 2015-11-27 DIAGNOSIS — M75102 Unspecified rotator cuff tear or rupture of left shoulder, not specified as traumatic: Secondary | ICD-10-CM

## 2015-11-27 DIAGNOSIS — M7542 Impingement syndrome of left shoulder: Secondary | ICD-10-CM | POA: Diagnosis not present

## 2015-11-27 DIAGNOSIS — M47812 Spondylosis without myelopathy or radiculopathy, cervical region: Secondary | ICD-10-CM

## 2015-11-27 DIAGNOSIS — M542 Cervicalgia: Secondary | ICD-10-CM

## 2015-11-27 DIAGNOSIS — I7300 Raynaud's syndrome without gangrene: Secondary | ICD-10-CM

## 2015-11-27 NOTE — Patient Instructions (Addendum)
Encounter Diagnoses  Name Primary?  . Neck pain Yes  . Cervical spondylosis without myelopathy   . Impingement syndrome, shoulder, left   . Rotator cuff tear, left   . [name]     My opinion is that you should have an MRI of her left shoulder to rule out a rotator cuff tear You should have physical therapy and her cervical spine for arthritis of the neck I also think you should switch to another anti-inflammatory such as diclofenac 50 mg twice a day or Lodine 300-400 mg 3 times a day  Return after mri

## 2015-11-27 NOTE — Progress Notes (Signed)
Chief Complaint  Patient presents with  . Shoulder Pain    Left shoulder pain   Second opinion regarding pain left shoulder   HPI 75 year old female history of [name] to sympathectomies presents with a one-year history of left shoulder and arm pain previously treated in [place] by Dr. supple in [facility]. After review of the notes and records I see no problem with the care. The patient became somewhat unhappy when she went to visit and said he was rushed and wanted a second opinion about maybe getting an MRI of the left shoulder.  She complains of catching locking stiffness and pain in the left shoulder described as radiating down the left arm and this has become constant and rated 7 out of 10 with no relief taking tramadol or Aleve  She's had adequate courses of physical therapy with no improvement and she has had multiple  Review of Systems  Constitutional: Positive for weight loss and malaise/fatigue.  Respiratory: Positive for shortness of breath.   Musculoskeletal: Positive for myalgias, back pain, joint pain and neck pain.  Neurological: Positive for dizziness and tremors.  Psychiatric/Behavioral: Positive for depression. The patient is nervous/anxious.     Past Medical History  Diagnosis Date  . Raynaud disease   . Scleroderma ([place])   . Osteoarthritis   . Dyspnea   . Thyroid disease     hypothyroidism  . Cataract     Past Surgical History  Procedure Laterality Date  . Cholecystectomy  1973  . Total abdominal hysterectomy  1974  . Breast enhancement surgery  1975  . Hand surgery  08/2012; 11/2012  . Melanoma excision      at 26 yrs of age  . Orif hip fracture Right 02/22/2014    Procedure: OPEN REDUCTION INTERNAL FIXATION RIGHT HIP;  Surgeon: Sanjuana Kava, MD;  Location: [facility];  Service: Orthopedics;  Laterality: Right;   Family History  Problem Relation Age of Onset  . Pancreatic cancer Father   . Raynaud syndrome Father   . Rashes /  Skin problems Daughter     possibly scleraderma  . Hip fracture Mother   . Mental illness Mother     attempted suicide at 60 yo   Social History  Substance Use Topics  . Smoking status: Never Smoker   . Smokeless tobacco: Never Used     Comment: passive tobacco smoke exposure  . Alcohol Use: No    Current outpatient prescriptions:  .  clonazePAM (KLONOPIN) 0.5 MG tablet, TAKE 1 TABLET TWICE DAILY AS NEEDED FOR ANXIETY, Disp: 60 tablet, Rfl: 1 .  Ferrous Sulfate Dried (SLOW RELEASE IRON) 45 MG TBCR, Take 1 tablet by mouth daily., Disp: , Rfl:  .  FLUoxetine (PROZAC) 40 MG capsule, Take 1 capsule (40 mg total) by mouth daily., Disp: 90 capsule, Rfl: 1 .  NIFEdipine (PROCARDIA XL/ADALAT-CC) 90 MG 24 hr tablet, Take 1 tablet (90 mg total) by mouth daily., Disp: 90 tablet, Rfl: 1 .  ondansetron (ZOFRAN) 4 MG tablet, Take 1 tablet (4 mg total) by mouth every 8 (eight) hours as needed for nausea or vomiting., Disp: 20 tablet, Rfl: 0 .  Probiotic Product (PROBIOTIC DAILY PO), Take 1 capsule by mouth daily., Disp: , Rfl:  .  sildenafil (REVATIO) 20 MG tablet, Take 20 mg by mouth daily as needed (not prn). , Disp: , Rfl:  .  simvastatin (ZOCOR) 40 MG tablet, Take 1 tablet (40 mg total) by mouth at bedtime., Disp: 90 tablet, Rfl: 1 .  SYNTHROID 50 MCG tablet, Take 1 tablet (50 mcg total) by mouth daily before breakfast., Disp: 90 tablet, Rfl: 2 .  omeprazole (PRILOSEC) 20 MG capsule, Take 1 capsule (20 mg total) by mouth daily. (Patient not taking: Reported on 11/27/2015), Disp: 90 capsule, Rfl: 1  BP 153/56 mmHg  Pulse 72  Ht 5\' 2"  (1.575 m)  Wt 108 lb (48.988 kg)  BMI 19.75 kg/m2  Physical Exam  Constitutional: She is oriented to person, place, and time. She appears well-developed and well-nourished. No distress.  Cardiovascular: Normal rate.   Hyperemia bilateral hands history of Raynaud's and previous sympathectomies  Neurological: She is alert and oriented to person, place, and time. She  has normal reflexes. She exhibits normal muscle tone. Coordination normal.  Reflexes of the biceps and triceps are 2+ and equal  Skin: Skin is warm and dry. No rash noted. She is not diaphoretic. No erythema. No pallor.  Psychiatric: She has a normal mood and affect. Her behavior is normal. Judgment and thought content normal.    Ortho Exam Right shoulder Normal appearance no tenderness full range of motion including internal rotation. Normal strength rotator cuff elbow flexors and extensors and grip strength No instability in the right shoulder negative impingement sign negative cross chest abduction sign and test Normal muscle tone Normal axillary lymph nodes cervical lymph nodes  Left shoulder decreased internal rotation Tenderness in the posterior joint line Tenderness in the rotator interval Pain in the left shoulder with radiation into the left arm and hand with rotation of the cervical spine which is noted to have decreased range of motion but a negative Spurling sign  She has tenderness in the left medial scapula left side of her cervical spine and base of the cervical spine   ASSESSMENT: My personal interpretation of the images: The cervical spine x-rays show cervical spondylosis primarily in the uncovertebral joints.  3 sets of notes are noted from Dr. supple from [place] are reviewed and indicate multiple injections a normal shoulder x-ray except for some degenerative changes of the before meals joint and they indicate that his physical therapy and the diagnosis of impingement and adhesive capsulitis   PLAN  C SPINE FILM (see above note on C-spine film)  MRI LEFT SHOULDER to document the rotator cuff.  I think it's okay to switch to a different anti-inflammatory, diclofenac 50 mg twice a day  I would of [place] that she would need cervical spine physical therapy as well.

## 2015-11-27 NOTE — Addendum Note (Signed)
Addended by: Carole Civil on: 11/27/2015 10:36 AM   Modules accepted: Miquel Dunn

## 2015-11-27 NOTE — Addendum Note (Signed)
Addended by: Baldomero Lamy B on: 11/27/2015 10:39 AM   Modules accepted: Orders

## 2015-12-05 ENCOUNTER — Ambulatory Visit (HOSPITAL_COMMUNITY)
Admission: RE | Admit: 2015-12-05 | Discharge: 2015-12-05 | Disposition: A | Discharge status: Home / Self Care | Payer: Medicare Other | Source: Ambulatory Visit | Attending: Orthopedic Surgery | Admitting: Orthopedic Surgery

## 2015-12-05 DIAGNOSIS — M75102 Unspecified rotator cuff tear or rupture of left shoulder, not specified as traumatic: Secondary | ICD-10-CM | POA: Diagnosis not present

## 2015-12-05 DIAGNOSIS — S46012A Strain of muscle(s) and tendon(s) of the rotator cuff of left shoulder, initial encounter: Secondary | ICD-10-CM | POA: Diagnosis not present

## 2015-12-05 DIAGNOSIS — M19012 Primary osteoarthritis, left shoulder: Secondary | ICD-10-CM | POA: Insufficient documentation

## 2015-12-07 ENCOUNTER — Other Ambulatory Visit: Payer: Self-pay | Admitting: Nurse Practitioner

## 2015-12-10 NOTE — Telephone Encounter (Signed)
Please call in klonopin with 1 refills 

## 2015-12-10 NOTE — Telephone Encounter (Signed)
Last seen 07/20/15, last filled 11/06/15, next appt is 12/31/15. Call in

## 2015-12-12 ENCOUNTER — Ambulatory Visit (INDEPENDENT_AMBULATORY_CARE_PROVIDER_SITE_OTHER): Payer: Medicare Other | Admitting: Orthopedic Surgery

## 2015-12-12 ENCOUNTER — Encounter: Payer: Self-pay | Admitting: Orthopedic Surgery

## 2015-12-12 VITALS — BP 158/76 | HR 74 | Ht 61.0 in | Wt 106.6 lb

## 2015-12-12 DIAGNOSIS — M47812 Spondylosis without myelopathy or radiculopathy, cervical region: Secondary | ICD-10-CM

## 2015-12-12 DIAGNOSIS — M19019 Primary osteoarthritis, unspecified shoulder: Secondary | ICD-10-CM

## 2015-12-12 DIAGNOSIS — M75102 Unspecified rotator cuff tear or rupture of left shoulder, not specified as traumatic: Secondary | ICD-10-CM | POA: Diagnosis not present

## 2015-12-12 DIAGNOSIS — M129 Arthropathy, unspecified: Secondary | ICD-10-CM

## 2015-12-12 NOTE — Patient Instructions (Signed)
Will see dr dean

## 2015-12-12 NOTE — Progress Notes (Signed)
   Mri f/u   Pain left shoulder   Previous  HPI 75 year old female history of [name] to sympathectomies presents with a one-year history of left shoulder and arm pain previously treated in [place] by Dr. supple in [facility]. After review of the notes and records I see no problem with the care. The patient became somewhat unhappy when she went to visit and said he was rushed and wanted a second opinion about maybe getting an MRI of the left shoulder.  She complains of catching locking stiffness and pain in the left shoulder described as radiating down the left arm and this has become constant and rated 7 out of 10 with no relief taking tramadol or Aleve  She's had adequate courses of physical therapy with no improvement and she has had multiple   Ros  H/o SOB, myalgias no change from 11/27/15  Mri report  IMPRESSION: 1. Severe arthritis of the glenohumeral joint with an osteochondral lesion of the head of the humerus within undermined partially detached fragment. 2. Partial-thickness articular surface linear tear of the supraspinous tendon. 3. Inflammation of the subacromial/subdeltoid bursa.     Electronically Signed   By: Lorriane Shire M.D.   On: 12/05/2015 15:50  I can see the chondral lesion, its large   I m referring her to Dr Marlou Sa for eval for possible TSA (with cuff repair) or RCR or reverse TSA Also possible for arthroscopy cuff repair and head resurfacing if glenoid ok

## 2015-12-13 NOTE — Addendum Note (Signed)
Addended by: Baldomero Lamy B on: 12/13/2015 11:37 AM   Modules accepted: Orders

## 2015-12-27 ENCOUNTER — Other Ambulatory Visit: Payer: Self-pay | Admitting: Nurse Practitioner

## 2015-12-31 ENCOUNTER — Ambulatory Visit (INDEPENDENT_AMBULATORY_CARE_PROVIDER_SITE_OTHER): Payer: Medicare Other | Admitting: Nurse Practitioner

## 2015-12-31 ENCOUNTER — Encounter: Payer: Self-pay | Admitting: Nurse Practitioner

## 2015-12-31 ENCOUNTER — Ambulatory Visit (INDEPENDENT_AMBULATORY_CARE_PROVIDER_SITE_OTHER): Payer: Medicare Other

## 2015-12-31 VITALS — BP 126/60 | HR 67 | Temp 98.1°F | Ht 61.0 in | Wt 107.2 lb

## 2015-12-31 DIAGNOSIS — D649 Anemia, unspecified: Secondary | ICD-10-CM

## 2015-12-31 DIAGNOSIS — E785 Hyperlipidemia, unspecified: Secondary | ICD-10-CM

## 2015-12-31 DIAGNOSIS — E039 Hypothyroidism, unspecified: Secondary | ICD-10-CM | POA: Diagnosis not present

## 2015-12-31 DIAGNOSIS — I10 Essential (primary) hypertension: Secondary | ICD-10-CM

## 2015-12-31 DIAGNOSIS — R634 Abnormal weight loss: Secondary | ICD-10-CM | POA: Diagnosis not present

## 2015-12-31 DIAGNOSIS — I7301 Raynaud's syndrome with gangrene: Secondary | ICD-10-CM

## 2015-12-31 DIAGNOSIS — F329 Major depressive disorder, single episode, unspecified: Secondary | ICD-10-CM

## 2015-12-31 DIAGNOSIS — M159 Polyosteoarthritis, unspecified: Secondary | ICD-10-CM

## 2015-12-31 DIAGNOSIS — K219 Gastro-esophageal reflux disease without esophagitis: Secondary | ICD-10-CM | POA: Diagnosis not present

## 2015-12-31 DIAGNOSIS — M349 Systemic sclerosis, unspecified: Secondary | ICD-10-CM | POA: Diagnosis not present

## 2015-12-31 DIAGNOSIS — F32A Depression, unspecified: Secondary | ICD-10-CM

## 2015-12-31 DIAGNOSIS — Z9882 Breast implant status: Secondary | ICD-10-CM | POA: Diagnosis not present

## 2015-12-31 DIAGNOSIS — R6889 Other general symptoms and signs: Secondary | ICD-10-CM | POA: Diagnosis not present

## 2015-12-31 IMAGING — DX DG CHEST 2V
2 series · 2 of 2 positions shown · non-contrast
Comparison: [DATE].

CLINICAL DATA: Weight loss

EXAM:
CHEST  2 VIEW

[chest pa]
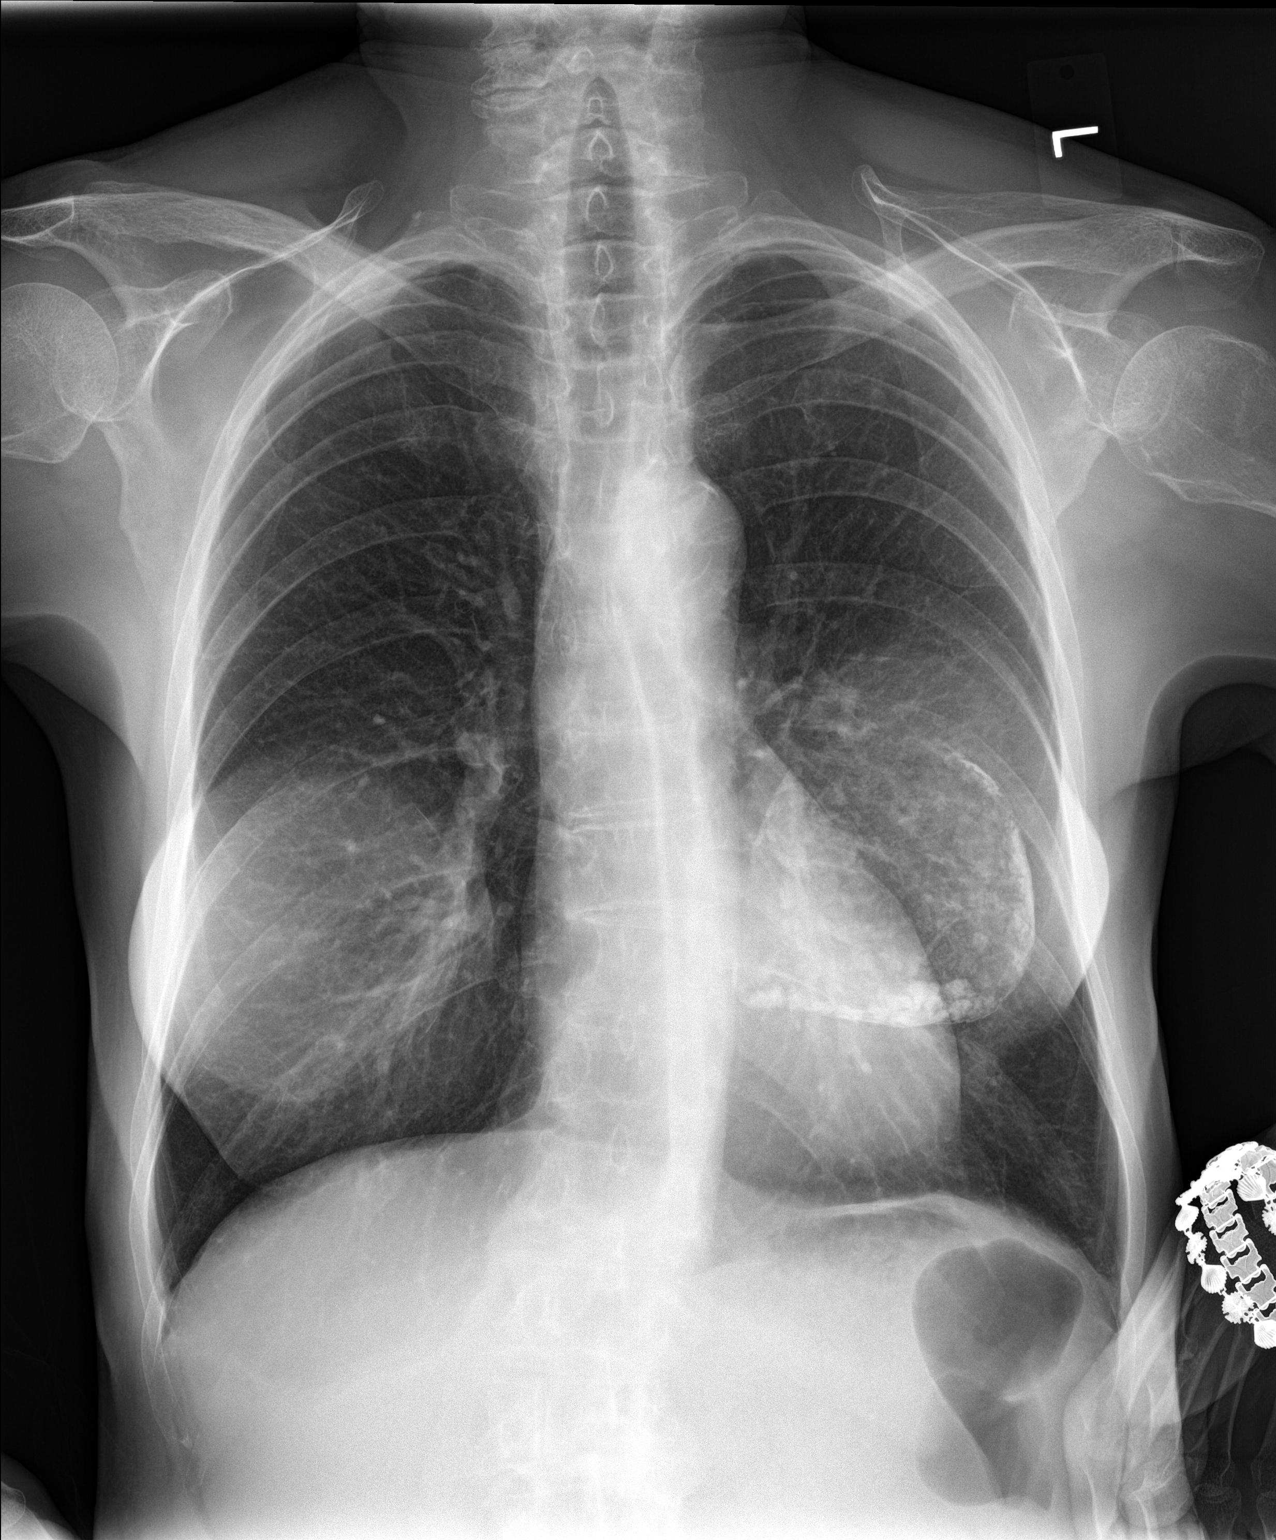

[chest lat]
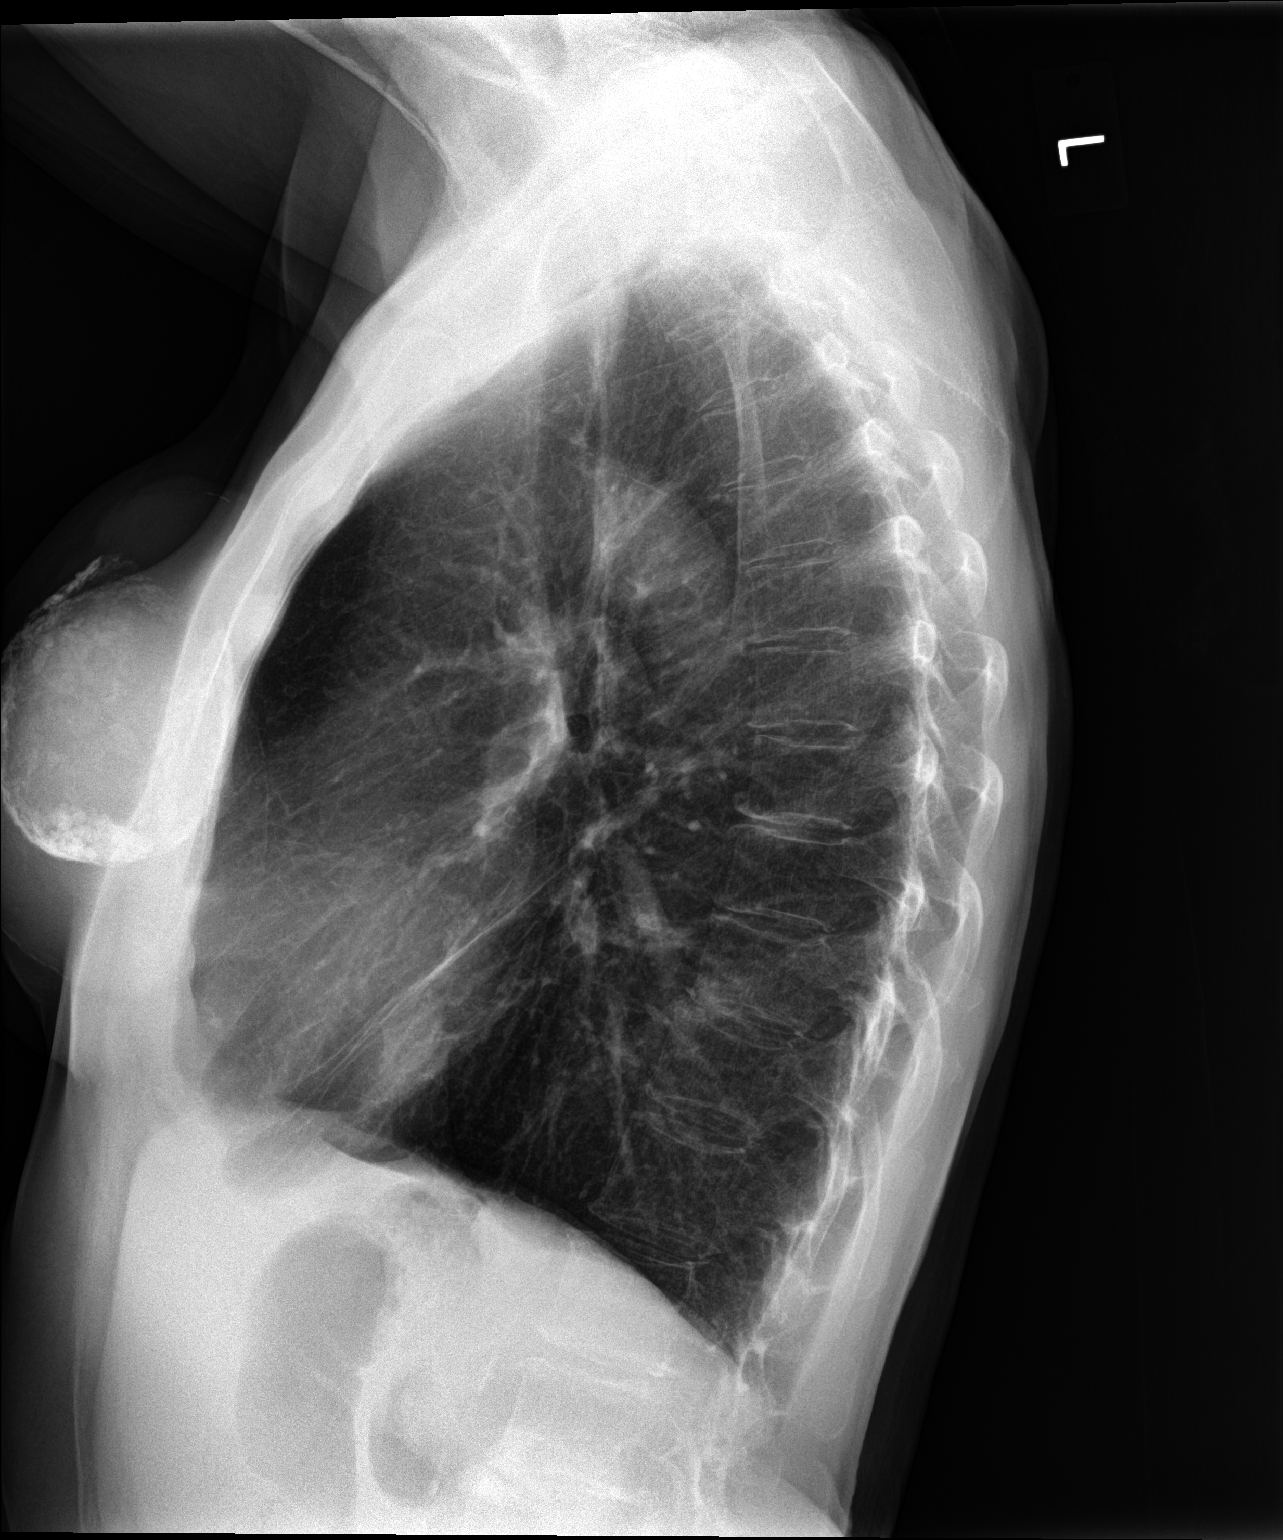

[2 of 2 positions shown; findings below may reference images not displayed]

FINDINGS: Lungs are hyperexpanded consistent with emphysema. Interstitial
markings are diffusely coarsened with chronic features. No focal
airspace consolidation or pulmonary edema. No pneumothorax. No
evidence of pleural effusion. Bones are diffusely demineralized.
Bilateral breast prostheses are evident.
IMPRESSION: Emphysema without acute cardiopulmonary findings.

## 2015-12-31 NOTE — Progress Notes (Signed)
Subjective:    Patient ID: Haley Trevino, female    DOB: 21-Jan-1941, 75 y.o.   MRN: [id number]  HPI  Patient here today for follow up of chronic medical problems.  Outpatient Encounter Prescriptions as of 12/31/2015  Medication Sig  . clonazePAM (KLONOPIN) 0.5 MG tablet TAKE 1 TABLET TWICE DAILY AS NEEDED FOR ANXIETY  . Ferrous Sulfate Dried (SLOW RELEASE IRON) 45 MG TBCR Take 1 tablet by mouth daily.  Marland Kitchen FLUoxetine (PROZAC) 40 MG capsule TAKE (1) CAPSULE DAILY  . NIFEdipine (PROCARDIA XL/ADALAT-CC) 90 MG 24 hr tablet TAKE 1 TABLET DAILY  . sildenafil (REVATIO) 20 MG tablet Take 20 mg by mouth daily as needed (not prn).   . SYNTHROID 50 MCG tablet Take 1 tablet (50 mcg total) by mouth daily before breakfast.   Sclerderma Getting worse- is being referred to [facility]- starting to get ulcers on fingers that get infected. Currently on doxy for finger infections- doesn't know how long she will be taking this. Having trouble walking due to so much pain. Raynauds Still has bad nerve pain and hurts to just touch anything. Has started on lyrica to help with nerve pain- could not tolerate gabapentin because it caused swelling. Has minimal swelling for lyrica but not like she had with gabapentin Hypothyroidism SYnthroid 32mg and doing well with that. Has fatigue but she thinks that is from everything nit just thyroid problems. Depression Decided to take a break from it because she has been on it so long- Sees psychiatrist next tomorrow and will probably start back on  It tomorrow. Hyperlipidemia-  Welchol is the only thing she can tolerate- statins make her hurt Hypertension On procardia which helps greatly with blood pressure and raynauds. Gerd Omeprazole works well to keep symptoms under control. BUt patient decide to stop taking and just use zantac OTC as needed. osteoarthritis Worse in left shoulder- was given ultram by Dr. BElpidio Galea has appointment with shoulder secialist and may decide to  have surgery.  C/O- has lost 20lbs in the last month- says that she drinks daily protein shake- she has no appetite.  Left breast implant feels different- feels like has leaked out some.   Review of Systems  Constitutional: Negative.   HENT: Negative.   Respiratory: Negative.   Cardiovascular: Negative.   Gastrointestinal: Negative.   Genitourinary: Negative.   Neurological: Negative.   Psychiatric/Behavioral: Negative.   All other systems reviewed and are negative.      Objective:   Physical Exam  Constitutional: She is oriented to person, place, and time. She appears well-developed and well-nourished.  HENT:  Nose: Nose normal.  Mouth/Throat: Oropharynx is clear and moist.  Eyes: EOM are normal.  Neck: Trachea normal, normal range of motion and full passive range of motion without pain. Neck supple. No JVD present. Carotid bruit is not present. No thyromegaly present.  Cardiovascular: Normal rate, regular rhythm, normal heart sounds and intact distal pulses.  Exam reveals no gallop and no friction rub.   No murmur heard. Tips of fingers are cold to touch with slow cap refill. Palms of hands are paler then they have been  Pulmonary/Chest: Effort normal and breath sounds normal.  bil breast implants are hard but left breast has soft pocket underneath that has developed in the last 2 months.  Abdominal: Soft. Bowel sounds are normal. She exhibits no distension and no mass. There is no tenderness.  Musculoskeletal: Normal range of motion.  limps with gait but improving  Lymphadenopathy:  She has no cervical adenopathy.  Neurological: She is alert and oriented to person, place, and time. She has normal reflexes.  Skin: Skin is warm and dry.  Psychiatric: She has a normal mood and affect. Her behavior is normal. Judgment and thought content normal.    BP 126/60 mmHg  Pulse 67  Temp(Src) 98.1 F (36.7 C) (Oral)  Ht '5\' 1"'  (1.549 m)  Wt 107 lb 3.2 oz (48.626 kg)  BMI  20.27 kg/m2  Chest x ray- clear-Preliminary reading by Ronnald Collum, FNP  [facility] ekg- NSR- unchanged from previous-Preliminary reading by Ronnald Collum, FNP  [facility]      Assessment & Plan:   1. [name] with gangrene ([place]) Keep follow up with specialist  2. Essential hypertension Do not add salt to diet - DG Chest 2 View; Future - EKG 12-Lead - CMP14+EGFR  3. Gastroesophageal reflux disease without esophagitis Avoid spicy foods Do not eat 2 hours prior to bedtime   4. Hypothyroidism, unspecified hypothyroidism type - Thyroid Panel With TSH  5. SCLERODERMA Keep follow up with rheumatologist  6. Depression Stress management  7. Hyperlipidemia with target LDL less than 100 Low fat diet - Lipid panel  8. Osteoarthritis of multiple joints, unspecified osteoarthritis type Keep follow up with rheumatologist and dr. Aline Brochure  9. Loss of weight - DG Chest 2 View; Future  10. H/O bilateral breast implants - Ambulatory referral to Plastic Surgery  11. Anemia, unspecified anemia type - Anemia Profile B    Labs pending Health maintenance reviewed Diet and exercise encouraged Continue all meds Follow up  In 3 months   [place], FNP

## 2016-01-01 LAB — ANEMIA PROFILE B
Basophils Absolute: 0 10*3/uL (ref 0.0–0.2)
Basos: 0 %
EOS (ABSOLUTE): 0.3 10*3/uL (ref 0.0–0.4)
EOS: 3 %
FOLATE: 6.5 ng/mL (ref >3.0)
Ferritin: 15 ng/mL (ref 15–150)
HEMOGLOBIN: 13.2 g/dL (ref 11.1–15.9)
Hematocrit: 38 % (ref 34.0–46.6)
IRON SATURATION: 17 % (ref 15–55)
IRON: 51 ug/dL (ref 27–139)
Immature Grans (Abs): 0 10*3/uL (ref 0.0–0.1)
Immature Granulocytes: 0 %
LYMPHS ABS: 2.3 10*3/uL (ref 0.7–3.1)
Lymphs: 22 %
MCH: 31.3 pg (ref 26.6–33.0)
MCHC: 34.7 g/dL (ref 31.5–35.7)
MCV: 90 fL (ref 79–97)
Monocytes Absolute: 0.7 10*3/uL (ref 0.1–0.9)
Monocytes: 7 %
NEUTROS ABS: 7.2 10*3/uL — AB (ref 1.4–7.0)
Neutrophils: 68 %
Platelets: 351 10*3/uL (ref 150–379)
RBC: 4.22 x10E6/uL (ref 3.77–5.28)
RDW: 14.7 % (ref 12.3–15.4)
Retic Ct Pct: 0.6 % (ref 0.6–2.6)
TIBC: 301 ug/dL (ref 250–450)
UIBC: 250 ug/dL (ref 118–369)
VITAMIN B 12: 344 pg/mL (ref 211–946)
WBC: 10.5 10*3/uL (ref 3.4–10.8)

## 2016-01-01 LAB — LIPID PANEL
Chol/HDL Ratio: 5 ratio units — ABNORMAL HIGH (ref 0.0–4.4)
Cholesterol, Total: 199 mg/dL (ref 100–199)
HDL: 40 mg/dL (ref >39)
LDL Calculated: 123 mg/dL — ABNORMAL HIGH (ref 0–99)
Triglycerides: 178 mg/dL — ABNORMAL HIGH (ref 0–149)
VLDL Cholesterol Cal: 36 mg/dL (ref 5–40)

## 2016-01-01 LAB — THYROID PANEL WITH TSH
Free Thyroxine Index: 2.2 (ref 1.2–4.9)
T3 Uptake Ratio: 27 % (ref 24–39)
T4 TOTAL: 8.1 ug/dL (ref 4.5–12.0)
TSH: 4.35 u[IU]/mL (ref 0.450–4.500)

## 2016-01-01 LAB — CMP14+EGFR
A/G RATIO: 1.6 (ref 1.2–2.2)
ALT: 7 IU/L (ref 0–32)
AST: 11 IU/L (ref 0–40)
Albumin: 4.1 g/dL (ref 3.5–4.8)
Alkaline Phosphatase: 54 IU/L (ref 39–117)
BUN / CREAT RATIO: 19 (ref 12–28)
BUN: 13 mg/dL (ref 8–27)
CHLORIDE: 101 mmol/L (ref 96–106)
CO2: 24 mmol/L (ref 18–29)
Calcium: 9.4 mg/dL (ref 8.7–10.3)
Creatinine, Ser: 0.69 mg/dL (ref 0.57–1.00)
GFR calc non Af Amer: 86 mL/min/{1.73_m2} (ref >59)
GFR, EST AFRICAN AMERICAN: 99 mL/min/{1.73_m2} (ref >59)
Globulin, Total: 2.6 g/dL (ref 1.5–4.5)
Glucose: 89 mg/dL (ref 65–99)
POTASSIUM: 3.8 mmol/L (ref 3.5–5.2)
Sodium: 140 mmol/L (ref 134–144)
TOTAL PROTEIN: 6.7 g/dL (ref 6.0–8.5)

## 2016-01-02 DIAGNOSIS — M19012 Primary osteoarthritis, left shoulder: Secondary | ICD-10-CM | POA: Diagnosis not present

## 2016-02-12 DIAGNOSIS — M340 Progressive systemic sclerosis: Secondary | ICD-10-CM | POA: Diagnosis not present

## 2016-02-12 DIAGNOSIS — K219 Gastro-esophageal reflux disease without esophagitis: Secondary | ICD-10-CM | POA: Diagnosis not present

## 2016-02-12 DIAGNOSIS — M150 Primary generalized (osteo)arthritis: Secondary | ICD-10-CM | POA: Diagnosis not present

## 2016-02-12 DIAGNOSIS — I7300 Raynaud's syndrome without gangrene: Secondary | ICD-10-CM | POA: Diagnosis not present

## 2016-02-12 DIAGNOSIS — T8543XA Leakage of breast prosthesis and implant, initial encounter: Secondary | ICD-10-CM | POA: Diagnosis not present

## 2016-02-12 DIAGNOSIS — M810 Age-related osteoporosis without current pathological fracture: Secondary | ICD-10-CM | POA: Diagnosis not present

## 2016-02-28 ENCOUNTER — Other Ambulatory Visit: Payer: Self-pay | Admitting: Plastic Surgery

## 2016-02-28 DIAGNOSIS — T8543XA Leakage of breast prosthesis and implant, initial encounter: Secondary | ICD-10-CM

## 2016-03-06 ENCOUNTER — Ambulatory Visit
Admission: RE | Admit: 2016-03-06 | Discharge: 2016-03-06 | Disposition: A | Discharge status: Home / Self Care | Payer: Medicare Other | Source: Ambulatory Visit | Attending: Plastic Surgery | Admitting: Plastic Surgery

## 2016-03-06 DIAGNOSIS — T8543XA Leakage of breast prosthesis and implant, initial encounter: Secondary | ICD-10-CM

## 2016-03-06 DIAGNOSIS — N6489 Other specified disorders of breast: Secondary | ICD-10-CM | POA: Diagnosis not present

## 2016-03-20 ENCOUNTER — Other Ambulatory Visit: Payer: Self-pay | Admitting: Plastic Surgery

## 2016-03-25 ENCOUNTER — Encounter: Payer: Self-pay | Admitting: Nurse Practitioner

## 2016-03-25 ENCOUNTER — Ambulatory Visit (INDEPENDENT_AMBULATORY_CARE_PROVIDER_SITE_OTHER): Payer: Medicare Other | Admitting: Nurse Practitioner

## 2016-03-25 VITALS — BP 164/70 | HR 56 | Temp 97.2°F | Ht 61.0 in | Wt 110.0 lb

## 2016-03-25 DIAGNOSIS — E538 Deficiency of other specified B group vitamins: Secondary | ICD-10-CM

## 2016-03-25 DIAGNOSIS — R350 Frequency of micturition: Secondary | ICD-10-CM | POA: Diagnosis not present

## 2016-03-25 DIAGNOSIS — Z23 Encounter for immunization: Secondary | ICD-10-CM | POA: Diagnosis not present

## 2016-03-25 DIAGNOSIS — R5383 Other fatigue: Secondary | ICD-10-CM

## 2016-03-25 LAB — URINALYSIS, COMPLETE
Bilirubin, UA: NEGATIVE
Glucose, UA: NEGATIVE
KETONES UA: NEGATIVE
Leukocytes, UA: NEGATIVE
NITRITE UA: NEGATIVE
Protein, UA: NEGATIVE
RBC UA: NEGATIVE
SPEC GRAV UA: 1.01 (ref 1.005–1.030)
UUROB: 0.2 mg/dL (ref 0.2–1.0)
pH, UA: 6 (ref 5.0–7.5)

## 2016-03-25 LAB — MICROSCOPIC EXAMINATION
Bacteria, UA: NONE SEEN
RBC MICROSCOPIC, UA: NONE SEEN /HPF (ref 0–?)

## 2016-03-25 MED ORDER — CYANOCOBALAMIN 1000 MCG/ML IJ SOLN
1000.0000 ug | Freq: Once | INTRAMUSCULAR | Status: AC
  Administered 2016-03-25: 1000 ug via INTRAMUSCULAR

## 2016-03-25 NOTE — Progress Notes (Signed)
   Subjective:    Patient ID: Haley Trevino, female    DOB: 1941/04/26, 75 y.o.   MRN: [id number]  HPI Patient here today because she is not feeling well. She report feeling weak and tired all the time and has no appetite. she is scheduled to have her breast implants removed  in 4 weeks time. She is requesting for Vit. B12 injection, she has been on this in the past. Her surgeon said he did not want o do her surgery until her energy level is better.  *Complain of increased urinary frequency  Review of Systems  Constitutional: Positive for fatigue and unexpected weight change (lost about 22lbs, without trying). Appetite change: poor appertite.  HENT: Negative.   Eyes: Negative.   Respiratory: Negative for shortness of breath.   Cardiovascular: Negative for chest pain (feels pressure in chest).  Gastrointestinal: Positive for nausea. Negative for abdominal pain, anal bleeding, blood in stool and vomiting.  Endocrine: Negative.  Negative for polyuria.  Genitourinary: Positive for enuresis, frequency, urgency and vaginal discharge (yellowish and maldourous). Negative for hematuria and pelvic pain.  Musculoskeletal: Negative.   Skin: Positive for pallor.  Neurological: Positive for dizziness, light-headedness and numbness (tip of fingers). Negative for syncope and headaches.  Psychiatric/Behavioral: Negative.        Objective:   Physical Exam  Constitutional: She is oriented to person, place, and time.  ill appearing, in no acute distress   HENT:  Head: Normocephalic.  Eyes: Pupils are equal, round, and reactive to light.  conjunctiva pale  Neck: Normal range of motion. Neck supple. No thyromegaly present.  Cardiovascular: Normal rate and intact distal pulses.   Murmur heard. Pulmonary/Chest: Effort normal and breath sounds normal.  Abdominal: Soft. Bowel sounds are normal. There is no tenderness.  Neurological: She is alert and oriented to person, place, and time.  Skin: Skin  is warm and dry. There is pallor.  Psychiatric: She has a normal mood and affect. Her behavior is normal. Judgment and thought content normal.   BP (!) 164/70 (BP Location: Right Leg, Cuff Size: Normal)   Pulse (!) 56   Temp 97.2 F (36.2 C) (Oral)   Ht 5\' 1"  (1.549 m)   Wt 110 lb (49.9 kg)   BMI 20.78 kg/m     Urine- negative  Assessment & Plan:  1. Other fatigue Need to increase protein and calories in her diet. - cyanocobalamin ((VITAMIN B-12)) injection 1,000 mcg; Inject 1 mL (1,000 mcg total) into the muscle once.  2. Urinary frequency Force fluids - Urinalysis, Complete    Labs pending Health maintenance reviewed Diet and exercise encouraged Continue all meds Follow up  In [place]   [place], FNP

## 2016-03-25 NOTE — Patient Instructions (Signed)
Fatigue  Fatigue is feeling tired all of the time, a lack of energy, or a lack of motivation. Occasional or mild fatigue is often a normal response to activity or life in general. However, long-lasting (chronic) or extreme fatigue may indicate an underlying medical condition.  HOME CARE INSTRUCTIONS   Watch your fatigue for any changes. The following actions may help to lessen any discomfort you are feeling:  · Talk to your health care provider about how much sleep you need each night. Try to get the required amount every night.  · Take medicines only as directed by your health care provider.  · Eat a healthy and nutritious diet. Ask your health care provider if you need help changing your diet.  · Drink enough fluid to keep your urine clear or pale yellow.  · Practice ways of relaxing, such as yoga, meditation, massage therapy, or acupuncture.  · Exercise regularly.    · Change situations that cause you stress. Try to keep your work and personal routine reasonable.  · Do not abuse illegal drugs.  · Limit alcohol intake to no more than 1 drink per day for nonpregnant women and 2 drinks per day for men. One drink equals 12 ounces of beer, 5 ounces of wine, or 1½ ounces of hard liquor.  · Take a multivitamin, if directed by your health care provider.  SEEK MEDICAL CARE IF:   · Your fatigue does not get better.  · You have a fever.    · You have unintentional weight loss or gain.  · You have headaches.    · You have difficulty:      Falling asleep.    Sleeping throughout the night.  · You feel angry, guilty, anxious, or sad.     · You are unable to have a bowel movement (constipation).    · You skin is dry.     · Your legs or another part of your body is swollen.    SEEK IMMEDIATE MEDICAL CARE IF:   · You feel confused.    · Your vision is blurry.  · You feel faint or pass out.    · You have a severe headache.    · You have severe abdominal, pelvic, or back pain.    · You have chest pain, shortness of breath, or an  irregular or fast heartbeat.    · You are unable to urinate or you urinate less than normal.    · You develop abnormal bleeding, such as bleeding from the rectum, vagina, nose, lungs, or nipples.  · You vomit blood.     · You have thoughts about harming yourself or committing suicide.    · You are worried that you might harm someone else.       This information is not intended to replace advice given to you by your health care provider. Make sure you discuss any questions you have with your health care provider.     Document Released: 04/20/2007 Document Revised: 07/14/2014 Document Reviewed: 10/25/2013  Elsevier Interactive Patient Education ©2016 [employer]

## 2016-03-27 ENCOUNTER — Other Ambulatory Visit: Payer: Self-pay | Admitting: Nurse Practitioner

## 2016-03-27 NOTE — Telephone Encounter (Signed)
Last seen 9/19 klonopin last rx'd 6/5 #60 no refills. If approved route to Nurse Pool B for nurse to call in.

## 2016-03-27 NOTE — Telephone Encounter (Signed)
Please call in clonazepam with 1 refills 

## 2016-04-03 ENCOUNTER — Ambulatory Visit (INDEPENDENT_AMBULATORY_CARE_PROVIDER_SITE_OTHER): Payer: Medicare Other | Admitting: Nurse Practitioner

## 2016-04-03 ENCOUNTER — Encounter: Payer: Self-pay | Admitting: Nurse Practitioner

## 2016-04-03 VITALS — BP 138/56 | HR 62 | Temp 97.9°F | Ht 61.0 in | Wt 109.0 lb

## 2016-04-03 DIAGNOSIS — E785 Hyperlipidemia, unspecified: Secondary | ICD-10-CM

## 2016-04-03 DIAGNOSIS — L03012 Cellulitis of left finger: Secondary | ICD-10-CM

## 2016-04-03 DIAGNOSIS — K219 Gastro-esophageal reflux disease without esophagitis: Secondary | ICD-10-CM

## 2016-04-03 DIAGNOSIS — I10 Essential (primary) hypertension: Secondary | ICD-10-CM

## 2016-04-03 DIAGNOSIS — F32A Depression, unspecified: Secondary | ICD-10-CM

## 2016-04-03 DIAGNOSIS — F329 Major depressive disorder, single episode, unspecified: Secondary | ICD-10-CM | POA: Diagnosis not present

## 2016-04-03 DIAGNOSIS — M349 Systemic sclerosis, unspecified: Secondary | ICD-10-CM

## 2016-04-03 DIAGNOSIS — I7301 Raynaud's syndrome with gangrene: Secondary | ICD-10-CM

## 2016-04-03 DIAGNOSIS — E039 Hypothyroidism, unspecified: Secondary | ICD-10-CM

## 2016-04-03 MED ORDER — DOXYCYCLINE HYCLATE 100 MG PO TABS: 100.0000 mg | ORAL_TABLET | Freq: Two times a day (BID) | ORAL | 0 refills | Status: DC

## 2016-04-03 NOTE — Progress Notes (Signed)
Subjective:    Patient ID: Haley Trevino, female    DOB: 09-Jul-1940, 75 y.o.   MRN: [id number]  HPI  Patient here today for follow up of chronic medical problems. The patient reports she has developed another ulcer on her left hand affecting the left pinky finger. She dropped something on it almost 2 weeks ago and an ulcer has developed. It had yellow pus that the patient could see so she cut a small slit in it so that it would drain out. She now reports that it feels hard to the touch and she can barely feel it when she touches it with her other hand, when it hits something the pain shoots up to a 10/10 on a pain scale of 0-10.   Current Outpatient Prescriptions:  .  clonazePAM (KLONOPIN) 0.5 MG tablet, TAKE 1 TABLET TWICE DAILY AS NEEDED FOR ANXIETY, Disp: 60 tablet, Rfl: 1 .  Ferrous Sulfate Dried (SLOW RELEASE IRON) 45 MG TBCR, Take 1 tablet by mouth daily., Disp: , Rfl:  .  FLUoxetine (PROZAC) 40 MG capsule, TAKE (1) CAPSULE DAILY, Disp: 90 capsule, Rfl: 0 .  NIFEdipine (PROCARDIA XL/ADALAT-CC) 90 MG 24 hr tablet, TAKE 1 TABLET DAILY, Disp: 90 tablet, Rfl: 0 .  omeprazole (PRILOSEC) 20 MG capsule, Take 20 mg by mouth daily., Disp: , Rfl:  .  sildenafil (REVATIO) 20 MG tablet, Take 20 mg by mouth 2 (two) times daily. , Disp: , Rfl:  .  SYNTHROID 50 MCG tablet, Take 1 tablet (50 mcg total) by mouth daily before breakfast., Disp: 90 tablet, Rfl: 2 .  doxycycline (VIBRA-TABS) 100 MG tablet, Take 1 tablet (100 mg total) by mouth 2 (two) times daily. 1 po bid, Disp: 20 tablet, Rfl: 0  Sclerderma Recurrent ulcers on fingers that get infected. Currently sees a rheumatologist in [place]. Having trouble walking due to so much pain. Raynauds Still has bad nerve pain and hurts to just touch anything. Could not tolerate gabapentin or lyrica because they caused swelling. Hypothyroidism Synthroid 46mg and doing well with that. Has fatigue but she thinks that is from everything not just  thyroid problems. Depression Did counseling for 11 years after her son and daughter-in-law died. Currently taking Prozac 40 mg once daily and states she is feeling good about her current state. Hyperlipidemia-  Not currently taking any medications, states she does not tolerate the statins Hypertension On procardia which helps greatly with blood pressure and raynauds. Gerd Omeprazole 20 mg once daily. osteoarthritis Worse in left shoulder- was given ultram by Dr. BElpidio Galea had appointment with shoulder secialist and needs surgery but is not pursuing this right now as she has too much going on right now.   Left breast implant has leaked. Implants are being removed next month (october)   Review of Systems  Constitutional: Positive for appetite change and fatigue.       Decreased appetite. No further weight loss since Feb/March, has gained a few lbs back. States everything she eats taste like cardboard.  HENT: Negative.   Eyes: Negative.   Respiratory: Negative.   Cardiovascular: Negative.   Gastrointestinal: Positive for constipation and diarrhea.  Genitourinary: Negative.   Neurological: Negative.   Psychiatric/Behavioral: Negative.   All other systems reviewed and are negative.      Objective:   Physical Exam  Constitutional: She is oriented to person, place, and time. She appears well-developed and well-nourished.  HENT:  Head: Normocephalic.  Right Ear: External ear normal.  Left Ear: External ear  normal.  Nose: Nose normal.  Mouth/Throat: Oropharynx is clear and moist.  Eyes: EOM are normal. Pupils are equal, round, and reactive to light.  Neck: Trachea normal, normal range of motion and full passive range of motion without pain. Neck supple. No JVD present. Carotid bruit is not present. No thyromegaly present.  Cardiovascular: Normal rate, regular rhythm, normal heart sounds and intact distal pulses.  Exam reveals no gallop and no friction rub.   No murmur heard. Tips of  fingers are cold to touch with slow cap refill. Palms of hands are paler then they have been  Pulmonary/Chest: Effort normal and breath sounds normal.  bil breast implants are hard but left breast has soft pocket underneath that has developed in the last 6 months.  Abdominal: Soft. Bowel sounds are normal. She exhibits no distension and no mass. There is no tenderness.  Musculoskeletal: Normal range of motion.  Slightly limbs with gait d/t right hip chronic pain  Lymphadenopathy:    She has no cervical adenopathy.  Neurological: She is alert and oriented to person, place, and time. She has normal reflexes.  Skin: Skin is warm and dry.  Left pinky finger red, cool, with small open area. No drainage or odor present.   Psychiatric: She has a normal mood and affect. Her behavior is normal. Judgment and thought content normal.    BP (!) 138/56   Pulse 62   Temp 97.9 F (36.6 C) (Oral)   Ht '5\' 1"'  (1.549 m)   Wt 109 lb (49.4 kg)   BMI 20.60 kg/m       Assessment & Plan:   1. Paronychia, left Be careful with hands. Do not cut areas to drain pus. - doxycycline (VIBRA-TABS) 100 MG tablet; Take 1 tablet (100 mg total) by mouth 2 (two) times daily. 1 po bid  Dispense: 20 tablet; Refill: 0  2. Essential hypertension Do not add salt to foods. - CMP14+EGFR  3. [name] with gangrene ([place]) Keep wearing gloves and using hand warmers. Continue to follow-up with Dr. Elpidio Galea.  4. Gastroesophageal reflux disease without esophagitis Under control with Omeprazole.  5. Hypothyroidism, unspecified hypothyroidism type Taking Synthroid. - Thyroid Panel With TSH  6. Depression Feels like she is doing well on Prozac.   7. Hyperlipidemia with target LDL less than 100 Limit fatty foods. - Lipid panel  8. SCLERODERMA Continue to follow-up with Dr. Elpidio Galea.    Labs pending Health maintenance reviewed Diet and exercise encouraged Continue all meds Follow up  In 3  months  Jeff, FNP

## 2016-04-03 NOTE — Patient Instructions (Signed)
Fingertip Infection When an infection is around the nail, it is called a paronychia. When it appears over the tip of the finger, it is called a felon. These infections are due to minor injuries or cracks in the skin. If they are not treated properly, they can lead to bone infection and permanent damage to the fingernail. Incision and drainage is necessary if a pus pocket (an abscess) has formed. Antibiotics and pain medicine may also be needed. Keep your hand elevated for the next 2-3 days to reduce swelling and pain. If a pack was placed in the abscess, it should be removed in 1-2 days by your caregiver. Soak the finger in warm water for 20 minutes 4 times daily to help promote drainage. Keep the hands as dry as possible. Wear protective gloves with cotton liners. See your caregiver for follow-up care as recommended.  HOME CARE INSTRUCTIONS   Keep wound clean, dry and dressed as suggested by your caregiver.  Soak in warm salt water for fifteen minutes, four times per day for bacterial infections.  Your caregiver will prescribe an antibiotic if a bacterial infection is suspected. Take antibiotics as directed and finish the prescription, even if the problem appears to be improving before the medicine is gone.  Only take over-the-counter or prescription medicines for pain, discomfort, or fever as directed by your caregiver. SEEK IMMEDIATE MEDICAL CARE IF:  There is redness, swelling, or increasing pain in the wound.  Pus or any other unusual drainage is coming from the wound.  An unexplained oral temperature above 102 F (38.9 C) develops.  You notice a foul smell coming from the wound or dressing. MAKE SURE YOU:   Understand these instructions.  Monitor your condition.  Contact your caregiver if you are getting worse or not improving.   This information is not intended to replace advice given to you by your health care provider. Make sure you discuss any questions you have with your  health care provider.   Document Released: 07/31/2004 Document Revised: 09/15/2011 Document Reviewed: 12/11/2014 Elsevier Interactive Patient Education 2016 [employer]  

## 2016-04-04 ENCOUNTER — Telehealth: Payer: Self-pay | Admitting: *Deleted

## 2016-04-04 LAB — CMP14+EGFR
A/G RATIO: 1.4 (ref 1.2–2.2)
ALK PHOS: 59 IU/L (ref 39–117)
ALT: 8 IU/L (ref 0–32)
AST: 13 IU/L (ref 0–40)
Albumin: 4.4 g/dL (ref 3.5–4.8)
BUN / CREAT RATIO: 16 (ref 12–28)
BUN: 10 mg/dL (ref 8–27)
Bilirubin Total: <0.2 mg/dL (ref 0.0–1.2)
CALCIUM: 9.5 mg/dL (ref 8.7–10.3)
CO2: 24 mmol/L (ref 18–29)
Chloride: 100 mmol/L (ref 96–106)
Creatinine, Ser: 0.62 mg/dL (ref 0.57–1.00)
GFR, EST AFRICAN AMERICAN: 103 mL/min/{1.73_m2} (ref >59)
GFR, EST NON AFRICAN AMERICAN: 89 mL/min/{1.73_m2} (ref >59)
GLOBULIN, TOTAL: 3.1 g/dL (ref 1.5–4.5)
Glucose: 87 mg/dL (ref 65–99)
POTASSIUM: 3.6 mmol/L (ref 3.5–5.2)
SODIUM: 141 mmol/L (ref 134–144)
Total Protein: 7.5 g/dL (ref 6.0–8.5)

## 2016-04-04 LAB — CBC WITH DIFFERENTIAL/PLATELET
BASOS ABS: 0 10*3/uL (ref 0.0–0.2)
Basos: 1 %
EOS (ABSOLUTE): 0.3 10*3/uL (ref 0.0–0.4)
Eos: 3 %
HEMATOCRIT: 34.6 % (ref 34.0–46.6)
Hemoglobin: 12.7 g/dL (ref 11.1–15.9)
IMMATURE GRANS (ABS): 0 10*3/uL (ref 0.0–0.1)
IMMATURE GRANULOCYTES: 0 %
LYMPHS ABS: 2.3 10*3/uL (ref 0.7–3.1)
Lymphs: 28 %
MCH: 32.8 pg (ref 26.6–33.0)
MCHC: 36.7 g/dL — ABNORMAL HIGH (ref 31.5–35.7)
MCV: 89 fL (ref 79–97)
Monocytes Absolute: 0.5 10*3/uL (ref 0.1–0.9)
Monocytes: 7 %
NEUTROS ABS: 5 10*3/uL (ref 1.4–7.0)
Neutrophils: 61 %
Platelets: 397 10*3/uL — ABNORMAL HIGH (ref 150–379)
RBC: 3.87 x10E6/uL (ref 3.77–5.28)
RDW: 15 % (ref 12.3–15.4)
WBC: 8.1 10*3/uL (ref 3.4–10.8)

## 2016-04-04 LAB — LIPID PANEL
CHOL/HDL RATIO: 4.8 ratio — AB (ref 0.0–4.4)
Cholesterol, Total: 214 mg/dL — ABNORMAL HIGH (ref 100–199)
HDL: 45 mg/dL (ref >39)
LDL Calculated: 135 mg/dL — ABNORMAL HIGH (ref 0–99)
Triglycerides: 171 mg/dL — ABNORMAL HIGH (ref 0–149)
VLDL Cholesterol Cal: 34 mg/dL (ref 5–40)

## 2016-04-04 LAB — THYROID PANEL WITH TSH
FREE THYROXINE INDEX: 2.2 (ref 1.2–4.9)
T3 UPTAKE RATIO: 25 % (ref 24–39)
T4 TOTAL: 8.8 ug/dL (ref 4.5–12.0)
TSH: 3.67 u[IU]/mL (ref 0.450–4.500)

## 2016-04-04 NOTE — Telephone Encounter (Signed)
Pt notified of results Verbalizes understanding 

## 2016-04-04 NOTE — Telephone Encounter (Signed)
-----   Message from [facility], Bee sent at 04/04/2016  8:00 AM EDT ----- Kidney and liver function stable LDL are increasing- probably should be on statin- does she want to try? Tsh normal CBC - platletts are slightly elevtaed PLEASE SEND COPY OF LABS TO Dr. Elpidio Galea Continue current meds- low fat diet and exercise and recheck in 3 months

## 2016-04-16 ENCOUNTER — Encounter (HOSPITAL_BASED_OUTPATIENT_CLINIC_OR_DEPARTMENT_OTHER): Payer: Self-pay | Admitting: *Deleted

## 2016-04-21 ENCOUNTER — Ambulatory Visit: Payer: Self-pay | Admitting: Physician Assistant

## 2016-04-22 ENCOUNTER — Encounter (HOSPITAL_BASED_OUTPATIENT_CLINIC_OR_DEPARTMENT_OTHER): Payer: Self-pay | Admitting: *Deleted

## 2016-04-23 ENCOUNTER — Encounter (HOSPITAL_BASED_OUTPATIENT_CLINIC_OR_DEPARTMENT_OTHER): Payer: Self-pay | Admitting: *Deleted

## 2016-04-23 ENCOUNTER — Encounter (HOSPITAL_BASED_OUTPATIENT_CLINIC_OR_DEPARTMENT_OTHER)
Admission: RE | Disposition: A | Discharge status: Home / Self Care | Payer: Self-pay | Source: Ambulatory Visit | Attending: Plastic Surgery

## 2016-04-23 ENCOUNTER — Ambulatory Visit (HOSPITAL_BASED_OUTPATIENT_CLINIC_OR_DEPARTMENT_OTHER)
Admission: RE | Admit: 2016-04-23 | Discharge: 2016-04-23 | Disposition: A | Discharge status: Home / Self Care | Payer: Medicare Other | Source: Ambulatory Visit | Attending: Plastic Surgery | Admitting: Plastic Surgery

## 2016-04-23 ENCOUNTER — Ambulatory Visit (HOSPITAL_BASED_OUTPATIENT_CLINIC_OR_DEPARTMENT_OTHER): Payer: Medicare Other | Admitting: Anesthesiology

## 2016-04-23 DIAGNOSIS — M1990 Unspecified osteoarthritis, unspecified site: Secondary | ICD-10-CM | POA: Insufficient documentation

## 2016-04-23 DIAGNOSIS — E1151 Type 2 diabetes mellitus with diabetic peripheral angiopathy without gangrene: Secondary | ICD-10-CM | POA: Diagnosis not present

## 2016-04-23 DIAGNOSIS — F329 Major depressive disorder, single episode, unspecified: Secondary | ICD-10-CM | POA: Insufficient documentation

## 2016-04-23 DIAGNOSIS — I10 Essential (primary) hypertension: Secondary | ICD-10-CM | POA: Diagnosis not present

## 2016-04-23 DIAGNOSIS — K219 Gastro-esophageal reflux disease without esophagitis: Secondary | ICD-10-CM | POA: Diagnosis not present

## 2016-04-23 DIAGNOSIS — E039 Hypothyroidism, unspecified: Secondary | ICD-10-CM | POA: Insufficient documentation

## 2016-04-23 DIAGNOSIS — T8549XA Other mechanical complication of breast prosthesis and implant, initial encounter: Secondary | ICD-10-CM | POA: Insufficient documentation

## 2016-04-23 DIAGNOSIS — Y834 Other reconstructive surgery as the cause of abnormal reaction of the patient, or of later complication, without mention of misadventure at the time of the procedure: Secondary | ICD-10-CM | POA: Insufficient documentation

## 2016-04-23 DIAGNOSIS — I7300 Raynaud's syndrome without gangrene: Secondary | ICD-10-CM | POA: Insufficient documentation

## 2016-04-23 DIAGNOSIS — Z79899 Other long term (current) drug therapy: Secondary | ICD-10-CM | POA: Insufficient documentation

## 2016-04-23 DIAGNOSIS — T85898A Other specified complication of other internal prosthetic devices, implants and grafts, initial encounter: Secondary | ICD-10-CM | POA: Diagnosis not present

## 2016-04-23 DIAGNOSIS — T8544XA Capsular contracture of breast implant, initial encounter: Secondary | ICD-10-CM | POA: Diagnosis not present

## 2016-04-23 DIAGNOSIS — T8543XA Leakage of breast prosthesis and implant, initial encounter: Secondary | ICD-10-CM | POA: Diagnosis not present

## 2016-04-23 HISTORY — PX: BREAST IMPLANT REMOVAL: SHX5361

## 2016-04-23 SURGERY — REMOVAL, IMPLANT, BREAST
Anesthesia: General | Site: Breast | Laterality: Bilateral

## 2016-04-23 MED ORDER — CEFAZOLIN SODIUM-DEXTROSE 2-4 GM/100ML-% IV SOLN
2.0000 g | INTRAVENOUS | Status: AC
  Administered 2016-04-23: 2 g via INTRAVENOUS

## 2016-04-23 MED ORDER — LIDOCAINE HCL (CARDIAC) 20 MG/ML IV SOLN
INTRAVENOUS | Status: DC | PRN
  Administered 2016-04-23: 30 mg via INTRAVENOUS

## 2016-04-23 MED ORDER — MEPERIDINE HCL 25 MG/ML IJ SOLN: 6.2500 mg | INTRAMUSCULAR | Status: DC | PRN

## 2016-04-23 MED ORDER — LACTATED RINGERS IV SOLN
INTRAVENOUS | Status: DC
  Administered 2016-04-23 (×2): via INTRAVENOUS

## 2016-04-23 MED ORDER — FENTANYL CITRATE (PF) 100 MCG/2ML IJ SOLN
INTRAMUSCULAR | Status: AC
  Filled 2016-04-23: qty 2

## 2016-04-23 MED ORDER — OXYCODONE HCL 5 MG PO TABS
5.0000 mg | ORAL_TABLET | Freq: Once | ORAL | Status: AC
Start: 2016-04-23 — End: 2016-04-23
  Administered 2016-04-23: 5 mg via ORAL

## 2016-04-23 MED ORDER — SODIUM CHLORIDE 0.9 % IR SOLN
Status: DC | PRN
  Administered 2016-04-23: 500 mL

## 2016-04-23 MED ORDER — CEFAZOLIN SODIUM-DEXTROSE 2-4 GM/100ML-% IV SOLN
INTRAVENOUS | Status: AC
  Filled 2016-04-23: qty 100

## 2016-04-23 MED ORDER — OXYCODONE HCL 5 MG PO TABS
ORAL_TABLET | ORAL | Status: AC
  Filled 2016-04-23: qty 1

## 2016-04-23 MED ORDER — LACTATED RINGERS IV SOLN: INTRAVENOUS | Status: DC

## 2016-04-23 MED ORDER — GLYCOPYRROLATE 0.2 MG/ML IJ SOLN: 0.2000 mg | Freq: Once | INTRAMUSCULAR | Status: DC | PRN

## 2016-04-23 MED ORDER — BUPIVACAINE-EPINEPHRINE 0.25% -1:200000 IJ SOLN
INTRAMUSCULAR | Status: DC | PRN
  Administered 2016-04-23: 10 mL

## 2016-04-23 MED ORDER — DEXAMETHASONE SODIUM PHOSPHATE 4 MG/ML IJ SOLN
INTRAMUSCULAR | Status: DC | PRN
  Administered 2016-04-23: 8 mg via INTRAVENOUS

## 2016-04-23 MED ORDER — SCOPOLAMINE 1 MG/3DAYS TD PT72: 1.0000 | MEDICATED_PATCH | Freq: Once | TRANSDERMAL | Status: DC | PRN

## 2016-04-23 MED ORDER — FENTANYL CITRATE (PF) 100 MCG/2ML IJ SOLN
25.0000 ug | INTRAMUSCULAR | Status: DC | PRN
  Administered 2016-04-23: 50 ug via INTRAVENOUS
  Administered 2016-04-23 (×2): 25 ug via INTRAVENOUS

## 2016-04-23 MED ORDER — MIDAZOLAM HCL 2 MG/2ML IJ SOLN
INTRAMUSCULAR | Status: AC
  Filled 2016-04-23: qty 2

## 2016-04-23 MED ORDER — PROPOFOL 10 MG/ML IV BOLUS
INTRAVENOUS | Status: DC | PRN
  Administered 2016-04-23: 50 mg via INTRAVENOUS
  Administered 2016-04-23: 150 mg via INTRAVENOUS

## 2016-04-23 MED ORDER — MIDAZOLAM HCL 2 MG/2ML IJ SOLN
1.0000 mg | INTRAMUSCULAR | Status: DC | PRN
  Administered 2016-04-23: 2 mg via INTRAVENOUS

## 2016-04-23 MED ORDER — FENTANYL CITRATE (PF) 100 MCG/2ML IJ SOLN
50.0000 ug | INTRAMUSCULAR | Status: AC | PRN
  Administered 2016-04-23: 25 ug via INTRAVENOUS
  Administered 2016-04-23: 50 ug via INTRAVENOUS
  Administered 2016-04-23: 100 ug via INTRAVENOUS
  Administered 2016-04-23: 25 ug via INTRAVENOUS

## 2016-04-23 SURGICAL SUPPLY — 68 items
ADH SKN CLS APL DERMABOND .7 (GAUZE/BANDAGES/DRESSINGS) ×2
BAG DECANTER FOR FLEXI CONT (MISCELLANEOUS) ×2 IMPLANT
BINDER BREAST LRG (GAUZE/BANDAGES/DRESSINGS) IMPLANT
BINDER BREAST MEDIUM (GAUZE/BANDAGES/DRESSINGS) ×1 IMPLANT
BINDER BREAST XLRG (GAUZE/BANDAGES/DRESSINGS) IMPLANT
BINDER BREAST XXLRG (GAUZE/BANDAGES/DRESSINGS) IMPLANT
BIOPATCH RED 1 DISK 7.0 (GAUZE/BANDAGES/DRESSINGS) ×2 IMPLANT
BLADE HEX COATED 2.75 (ELECTRODE) ×2 IMPLANT
BLADE SURG 15 STRL LF DISP TIS (BLADE) ×1 IMPLANT
BLADE SURG 15 STRL SS (BLADE) ×4
BNDG GAUZE ELAST 4 BULKY (GAUZE/BANDAGES/DRESSINGS) ×4 IMPLANT
CANISTER SUCT 1200ML W/VALVE (MISCELLANEOUS) ×2 IMPLANT
CHLORAPREP W/TINT 26ML (MISCELLANEOUS) ×2 IMPLANT
COVER BACK TABLE 60X90IN (DRAPES) ×2 IMPLANT
COVER MAYO STAND STRL (DRAPES) ×2 IMPLANT
DECANTER SPIKE VIAL GLASS SM (MISCELLANEOUS) IMPLANT
DERMABOND ADVANCED (GAUZE/BANDAGES/DRESSINGS) ×2
DERMABOND ADVANCED .7 DNX12 (GAUZE/BANDAGES/DRESSINGS) IMPLANT
DRAIN CHANNEL 19F RND (DRAIN) ×2 IMPLANT
DRAPE LAPAROSCOPIC ABDOMINAL (DRAPES) ×2 IMPLANT
DRSG PAD ABDOMINAL 8X10 ST (GAUZE/BANDAGES/DRESSINGS) ×5 IMPLANT
ELECT BLADE 4.0 EZ CLEAN MEGAD (MISCELLANEOUS) ×2
ELECT BLADE 6.5 .24CM SHAFT (ELECTRODE) IMPLANT
ELECT REM PT RETURN 9FT ADLT (ELECTROSURGICAL) ×2
ELECTRODE BLDE 4.0 EZ CLN MEGD (MISCELLANEOUS) IMPLANT
ELECTRODE REM PT RTRN 9FT ADLT (ELECTROSURGICAL) ×1 IMPLANT
EVACUATOR SILICONE 100CC (DRAIN) ×1 IMPLANT
GLOVE BIO SURGEON STRL SZ 6.5 (GLOVE) ×5 IMPLANT
GLOVE BIOGEL PI IND STRL 7.0 (GLOVE) IMPLANT
GLOVE BIOGEL PI INDICATOR 7.0 (GLOVE) ×1
GLOVE ECLIPSE 6.5 STRL STRAW (GLOVE) ×1 IMPLANT
GOWN STRL REUS W/ TWL LRG LVL3 (GOWN DISPOSABLE) ×2 IMPLANT
GOWN STRL REUS W/TWL LRG LVL3 (GOWN DISPOSABLE) ×4
IV NS 1000ML (IV SOLUTION)
IV NS 1000ML BAXH (IV SOLUTION) IMPLANT
IV NS 500ML (IV SOLUTION)
IV NS 500ML BAXH (IV SOLUTION) IMPLANT
KIT FILL SYSTEM UNIVERSAL (SET/KITS/TRAYS/PACK) IMPLANT
NDL HYPO 25X1 1.5 SAFETY (NEEDLE) IMPLANT
NDL SAFETY ECLIPSE 18X1.5 (NEEDLE) IMPLANT
NEEDLE HYPO 18GX1.5 SHARP (NEEDLE) ×2
NEEDLE HYPO 25X1 1.5 SAFETY (NEEDLE) ×2 IMPLANT
NS IRRIG 1000ML POUR BTL (IV SOLUTION) IMPLANT
PACK BASIN DAY SURGERY FS (CUSTOM PROCEDURE TRAY) ×2 IMPLANT
PENCIL BUTTON HOLSTER BLD 10FT (ELECTRODE) ×2 IMPLANT
PIN SAFETY STERILE (MISCELLANEOUS) ×1 IMPLANT
SLEEVE SCD COMPRESS KNEE MED (MISCELLANEOUS) ×2 IMPLANT
SPONGE LAP 18X18 X RAY DECT (DISPOSABLE) ×5 IMPLANT
SUT MNCRL AB 4-0 PS2 18 (SUTURE) ×2 IMPLANT
SUT MON AB 3-0 SH 27 (SUTURE) ×4
SUT MON AB 3-0 SH27 (SUTURE) ×1 IMPLANT
SUT MON AB 5-0 PS2 18 (SUTURE) ×3 IMPLANT
SUT PDS AB 2-0 CT2 27 (SUTURE) IMPLANT
SUT PROLENE 3 0 PS 2 (SUTURE) IMPLANT
SUT SILK 3 0 PS 1 (SUTURE) IMPLANT
SUT SILK 4 0 PS 2 (SUTURE) ×2 IMPLANT
SUT VIC AB 3-0 SH 27 (SUTURE)
SUT VIC AB 3-0 SH 27X BRD (SUTURE) IMPLANT
SUT VICRYL 4-0 PS2 18IN ABS (SUTURE) IMPLANT
SWAB COLLECTION DEVICE MRSA (MISCELLANEOUS) IMPLANT
SWAB CULTURE ESWAB REG 1ML (MISCELLANEOUS) IMPLANT
SYR 50ML LL SCALE MARK (SYRINGE) IMPLANT
SYR BULB IRRIGATION 50ML (SYRINGE) ×2 IMPLANT
SYR CONTROL 10ML LL (SYRINGE) ×1 IMPLANT
TOWEL OR 17X24 6PK STRL BLUE (TOWEL DISPOSABLE) ×4 IMPLANT
TUBE CONNECTING 20X1/4 (TUBING) ×2 IMPLANT
UNDERPAD 30X30 (UNDERPADS AND DIAPERS) ×4 IMPLANT
YANKAUER SUCT BULB TIP NO VENT (SUCTIONS) ×2 IMPLANT

## 2016-04-23 NOTE — Transfer of Care (Signed)
Immediate Anesthesia Transfer of Care Note  Patient: Haley Trevino  Procedure(s) Performed: Procedure(s): REMOVALBILATERAL BREAST IMPLANTS (Bilateral)  Patient Location: PACU  Anesthesia Type:General  Level of Consciousness: awake, alert  and oriented  Airway & Oxygen Therapy: Patient Spontanous Breathing and Patient connected to nasal cannula oxygen  Post-op Assessment: Report given to RN and Post -op Vital signs reviewed and stable  Post vital signs: Reviewed and stable  Last Vitals:  Vitals:   04/23/16 1049 04/23/16 1050  BP: (!) 172/81   Pulse:    Resp: 18 15  Temp:      Last Pain:  Vitals:   04/23/16 0801  TempSrc: Oral         Complications: No apparent anesthesia complications

## 2016-04-23 NOTE — Anesthesia Postprocedure Evaluation (Signed)
Anesthesia Post Note  Patient: Haley Trevino  Procedure(s) Performed: Procedure(s) (LRB): REMOVALBILATERAL BREAST IMPLANTS (Bilateral)  Patient location during evaluation: PACU Anesthesia Type: General Level of consciousness: awake and alert Pain management: pain level controlled Vital Signs Assessment: post-procedure vital signs reviewed and stable Respiratory status: spontaneous breathing, nonlabored ventilation, respiratory function stable and patient connected to nasal cannula oxygen Cardiovascular status: blood pressure returned to baseline and stable Postop Assessment: no signs of nausea or vomiting Anesthetic complications: no    Last Vitals:  Vitals:   04/23/16 1049 04/23/16 1050  BP: (!) 172/81   Pulse:    Resp: 18 15  Temp:      Last Pain:  Vitals:   04/23/16 1130  TempSrc:   PainSc: [place]

## 2016-04-23 NOTE — Brief Op Note (Addendum)
04/23/2016  11:21 AM  PATIENT:  Haley Trevino  75 y.o. female  PRE-OPERATIVE DIAGNOSIS:  Ruptured breast implants, bilateral  POST-OPERATIVE DIAGNOSIS:  Ruptured breast implants, bilateral  PROCEDURE:  Procedure(s): REMOVALBILATERAL BREAST IMPLANTS (Bilateral)  SURGEON:  Surgeon(s) and Role:    * Madasyn Heath S Evan Osburn, DO - Primary  PHYSICIAN ASSISTANT: Shawn Rayburn, PA ASSISTANTS: none   ANESTHESIA:   general  EBL:  Total I/O In: 1300 [I.V.:1300] Out: 15 [Blood:15]  BLOOD ADMINISTERED:none  DRAINS: bilateral breast pockets   LOCAL MEDICATIONS USED:  MARCAINE     SPECIMEN:  Source of Specimen:  bilateral capsule contractures and ruptured silicone implants  DISPOSITION OF SPECIMEN:  PATHOLOGY  COUNTS:  YES  TOURNIQUET:  * No tourniquets in log *  DICTATION: .Dragon Dictation  PLAN OF CARE: Discharge to home after PACU  PATIENT DISPOSITION:  PACU - hemodynamically stable.   Delay start of Pharmacological VTE agent (>24hrs) due to surgical blood loss or risk of bleeding: no

## 2016-04-23 NOTE — Anesthesia Procedure Notes (Signed)
Procedure Name: LMA Insertion Date/Time: 04/23/2016 9:24 AM Performed by: Melynda Ripple D Pre-anesthesia Checklist: Patient identified, Emergency Drugs available, Suction available and Patient being monitored Patient Re-evaluated:Patient Re-evaluated prior to inductionOxygen Delivery Method: Circle system utilized Preoxygenation: Pre-oxygenation with 100% oxygen Intubation Type: IV induction Ventilation: Mask ventilation without difficulty LMA: LMA inserted LMA Size: 4.0 and 3.0 Number of attempts: 1 Airway Equipment and Method: Bite block Placement Confirmation: positive ETCO2 Tube secured with: Tape Dental Injury: Teeth and Oropharynx as per pre-operative assessment

## 2016-04-23 NOTE — Anesthesia Procedure Notes (Signed)
Procedure Name: LMA Insertion Date/Time: 04/23/2016 9:24 AM Performed by: Melynda Ripple D Pre-anesthesia Checklist: Patient identified, Emergency Drugs available, Suction available and Patient being monitored Patient Re-evaluated:Patient Re-evaluated prior to inductionOxygen Delivery Method: Circle system utilized Preoxygenation: Pre-oxygenation with 100% oxygen Intubation Type: IV induction Ventilation: Mask ventilation without difficulty LMA: LMA inserted LMA Size: 3.0 Number of attempts: 1 Airway Equipment and Method: Bite block Placement Confirmation: positive ETCO2 Tube secured with: Tape Dental Injury: Teeth and Oropharynx as per pre-operative assessment

## 2016-04-23 NOTE — Anesthesia Preprocedure Evaluation (Addendum)
Anesthesia Evaluation  Patient identified by MRN, date of birth, ID band Patient awake    Reviewed: Allergy & Precautions, NPO status , Patient's Chart, lab work & pertinent test results  Airway Mallampati: III  TM Distance: >3 FB Neck ROM: Full    Dental  (+) Upper Dentures, Lower Dentures   Pulmonary neg pulmonary ROS,    breath sounds clear to auscultation       Cardiovascular hypertension, + Peripheral Vascular Disease   Rhythm:Regular Rate:Normal     Neuro/Psych PSYCHIATRIC DISORDERS Depression negative neurological ROS     GI/Hepatic Neg liver ROS, GERD  Medicated,  Endo/Other  Hypothyroidism   Renal/GU negative Renal ROS  negative genitourinary   Musculoskeletal  (+) Arthritis , Osteoarthritis,    Abdominal   Peds negative pediatric ROS (+)  Hematology negative hematology ROS (+)   Anesthesia Other Findings  - Raynaud's  Reproductive/Obstetrics negative OB ROS                            Lab Results  Component Value Date   WBC 8.1 04/03/2016   HGB 11.2 09/04/2014   HCT 34.6 04/03/2016   MCV 89 04/03/2016   PLT 397 (H) 04/03/2016   Lab Results  Component Value Date   CREATININE 0.62 04/03/2016   BUN 10 04/03/2016   NA 141 04/03/2016   K 3.6 04/03/2016   CL 100 04/03/2016   CO2 24 04/03/2016   Lab Results  Component Value Date   INR 0.95 02/22/2014     EKG: normal sinus rhythm. 6/201  Anesthesia Physical Anesthesia Plan  ASA: II  Anesthesia Plan: General   Post-op Pain Management:    Induction: Intravenous  Airway Management Planned: LMA  Additional Equipment:   Intra-op Plan:   Post-operative Plan: Extubation in OR  Informed Consent: I have reviewed the patients History and Physical, chart, labs and discussed the procedure including the risks, benefits and alternatives for the proposed anesthesia with the patient or authorized representative who has  indicated his/her understanding and acceptance.   Dental advisory given  Plan Discussed with: CRNA  Anesthesia Plan Comments:         Anesthesia Quick Evaluation

## 2016-04-23 NOTE — Discharge Instructions (Addendum)
°  Post Anesthesia Home Care Instructions  Activity: Get plenty of rest for the remainder of the day. A responsible adult should stay with you for 24 hours following the procedure.  For the next 24 hours, DO NOT: -Drive a car -Paediatric nurse -Drink alcoholic beverages -Take any medication unless instructed by your physician -Make any legal decisions or sign important papers.  Meals: Start with liquid foods such as gelatin or soup. Progress to regular foods as tolerated. Avoid greasy, spicy, heavy foods. If nausea and/or vomiting occur, drink only clear liquids until the nausea and/or vomiting subsides. Call your physician if vomiting continues.  Special Instructions/Symptoms: Your throat may feel dry or sore from the anesthesia or the breathing tube placed in your throat during surgery. If this causes discomfort, gargle with warm salt water. The discomfort should disappear within 24 hours.  If you had a scopolamine patch placed behind your ear for the management of post- operative nausea and/or vomiting:  1. The medication in the patch is effective for 72 hours, after which it should be removed.  Wrap patch in a tissue and discard in the trash. Wash hands thoroughly with soap and water. 2. You may remove the patch earlier than 72 hours if you experience unpleasant side effects which may include dry mouth, dizziness or visual disturbances. 3. Avoid touching the patch. Wash your hands with soap and water after contact with the patch.           JP Drain [employer] this sheet to all of your post-operative appointments while you have your drains.  Please measure your drains by CC's or ML's.  Make sure you drain and measure your JP Drains 2 or 3 times per day.  At the end of each day, add up totals for the left side and add up totals for the right side.    ( 9 am )     ( 3 pm )        ( 9 pm )                Date L  R  L  R  L  R  Total L/R                                                                                                                                                                                      Breast binder for next several days.    Empty drains twice per day and record amount.  May shower when drain out.

## 2016-04-23 NOTE — H&P (Signed)
Haley Trevino is an 75 y.o. female.   Chief Complaint: ruptured breast implants HPI: The patient is a 75 y.o. yrs old wf here for a history and physical prior to removal of bilateral ruptured silicone implants.  She underwent silicone breast implant placement 40 years ago by Dr. Judene Companion in [place].  Over the last 5+ years she has notice hardening of the breast tissue, pain of the breasts and tenderness of the axilla.  On exam today she has bilateral lymphadenopathy in the axillary area. The breast tissue is very hard and the implants are most likely ruptured.  The concern is with the lymphadenopathy and the history of scleroderma and [name].  It is not clear if the implants are submuscular or submammary.  Her last mammogram was ~ 10 year ago and will not be able to show all the tissue due to the implants.  Past Medical History:  Diagnosis Date  . Cataract   . Dyspnea   . Osteoarthritis   . Raynaud disease   . Scleroderma ([place])   . Thyroid disease    hypothyroidism    Past Surgical History:  Procedure Laterality Date  . BREAST ENHANCEMENT SURGERY  1975  . CHOLECYSTECTOMY  1973  . HAND SURGERY  08/2012; 11/2012  . MELANOMA EXCISION     at 24 yrs of age  . ORIF HIP FRACTURE Right 02/22/2014   Procedure: OPEN REDUCTION INTERNAL FIXATION RIGHT HIP;  Surgeon: Sanjuana Kava, MD;  Location: [facility];  Service: Orthopedics;  Laterality: Right;  . TOTAL ABDOMINAL HYSTERECTOMY  1974    Family History  Problem Relation Age of Onset  . Pancreatic cancer Father   . Raynaud syndrome Father   . Rashes / Skin problems Daughter     possibly scleraderma  . Hip fracture Mother   . Mental illness Mother     attempted suicide at 54 yo   Social History:  reports that she has never smoked. She has never used smokeless tobacco. She reports that she does not drink alcohol or use drugs.  Allergies:  Allergies  Allergen Reactions  . Codeine Shortness Of Breath    REACTION:  dyspnea  . Dilaudid [Hydromorphone Hcl] Anaphylaxis  . Hydromorphone Anaphylaxis  . Gabapentin Swelling    Swelling in feet, and in legs  . Aleve [Naproxen Sodium] Rash  . Lyrica [Pregabalin] Rash  . Methylprednisolone Palpitations    Increased BP    Medications Prior to Admission  Medication Sig Dispense Refill  . clonazePAM (KLONOPIN) 0.5 MG tablet TAKE 1 TABLET TWICE DAILY AS NEEDED FOR ANXIETY 60 tablet 1  . Ferrous Sulfate Dried (SLOW RELEASE IRON) 45 MG TBCR Take 1 tablet by mouth daily.    Marland Kitchen FLUoxetine (PROZAC) 40 MG capsule TAKE (1) CAPSULE DAILY 90 capsule 0  . NIFEdipine (PROCARDIA XL/ADALAT-CC) 90 MG 24 hr tablet TAKE 1 TABLET DAILY 90 tablet 0  . omeprazole (PRILOSEC) 20 MG capsule Take 20 mg by mouth daily.    . sildenafil (REVATIO) 20 MG tablet Take 20 mg by mouth 2 (two) times daily.     Marland Kitchen SYNTHROID 50 MCG tablet Take 1 tablet (50 mcg total) by mouth daily before breakfast. 90 tablet 2    No results found for this or any previous visit (from the past 48 hour(s)). No results found.  Review of Systems  Constitutional: Negative.   HENT: Negative.   Eyes: Negative.   Respiratory: Negative.   Cardiovascular: Negative.   Gastrointestinal: Negative.  Genitourinary: Negative.   Musculoskeletal: Negative.   Skin: Negative.   Neurological: Negative.   Psychiatric/Behavioral: Negative.     Blood pressure 139/69, pulse 75, temperature 98 F (36.7 C), temperature source Oral, resp. rate 16, height 5\' 1"  (1.549 m), weight 48.1 kg (106 lb), SpO2 99 %. Physical Exam  Constitutional: She is oriented to person, place, and time. She appears well-developed and well-nourished.  HENT:  Head: Normocephalic and atraumatic.  Eyes: EOM are normal. Pupils are equal, round, and reactive to light.  Cardiovascular: Normal rate.   Respiratory: Effort normal. No respiratory distress.  GI: Soft. She exhibits no distension. There is no tenderness.  Neurological: She is alert and  oriented to person, place, and time.  Skin: Skin is warm.  Psychiatric: She has a normal mood and affect. Her behavior is normal. Judgment and thought content normal.     Assessment/Plan Removal of bilateral breast implants due to rupture.  Wallace Going, DO 04/23/2016, 8:27 AM

## 2016-04-23 NOTE — Op Note (Signed)
OPERATIVE NOTE  DATE OF OPERATION: 04/23/2016  LOCATION: [place]  PREOPERATIVE DIAGNOSIS: Bilateral ruptured silicone breast implants with capsular contracture  POSTOPERATIVE DIAGNOSIS: Same  PROCEDURE:   1. Complete excision of bilateral capsular contratures. 2. Removal of bilateral ruptured silicone implants.  SURGEON: Claire Sanger Dillingham, DO  ASSISTANT: Shawn Rayburn, PA  ANESTHESIA: General and Local  EBL: Minimal  CONDITION: Stable  COMPLICATIONS: None  INDICATION:  The patient, Haley Trevino, is a 75 y.o. year old female born on [birth year], here for treatment of bilateral ruptured silicone implants that were placed over 40 years ago by Dr. Judene Companion in [place]. [id number]  PROCEDURE DETAILS:  The patient was taken to the operating room and placed on the operating room table in the supine position.  General anesthetic was administered.  All bone prominent areas were padded.  The involved area was prepped and draped in a sterile fashion using a betadine prep.  A time out was called and all information was confirmed to be correct by all in the room.   Right:  Local anesthetic was injected for intraoperative hemostasis and postoperative pain control at the inframammary fold.  The #15 blade was used to make an incision at the inframammary lateral area.  The bovie was used to dissect down to the capsule.  The silicone and implant was extracted from the capsule.  The capsule was then freed from the surrounding tissue using the bovie.  The capsule and impant were above the muscle.  The pocket was irrigated with antibiotic solution.  Hemostasis was achieved with electrocautery.  A #15 blade was used to make an incision for placement of the drain.  The drain was secured with a 4-0 silk to the skin.  The deep layers were closed with 3-0 Monocryl.  The subcutaneous tissue was re-approximated with 4.0 Monocryl.  The skin edges were closed with a running 5-0  Monocryl.  Dermabond was applied. There were no concerning lesions noted.  Left: Right:  Local anesthetic was injected for intraoperative hemostasis and postoperative pain control at the inframammary fold.  The #15 blade was used to make an incision at the inframammary lateral area.  The bovie was used to dissect down to the capsule and release it from the pectoralis muscle and breast tissue.  The silicone and implant was extracted from the capsule.  The capsule was then removed using the bovie.  The capsule and impant were above the muscle.  The pocket was irrigated with antibiotic solution.  Hemostasis was achieved with electrocautery.  A #15 blade was used to make an incision for placement of the drain.  The drain was secured with a 4-0 silk to the skin.  The deep layers were closed with 3-0 Monocryl.  The subcutaneous tissue was re-approximated with 4.0 Monocryl.  The skin edges were closed with a running 5-0 Monocryl.  Dermabond was applied.  Both implants were severely ruptured.  The patient tolerated the procedure well.  There were no complication.  The patient was allowed to wake from anesthesia and taken to the recovery room in stable condition. The family was notified at the end of the case.

## 2016-04-24 ENCOUNTER — Telehealth: Payer: Self-pay | Admitting: Nurse Practitioner

## 2016-04-24 ENCOUNTER — Encounter (HOSPITAL_BASED_OUTPATIENT_CLINIC_OR_DEPARTMENT_OTHER): Payer: Self-pay | Admitting: Plastic Surgery

## 2016-04-24 NOTE — Addendum Note (Signed)
Addendum  created 04/24/16 0559 by Willa Frater, CRNA   Charge Capture section accepted

## 2016-05-22 DIAGNOSIS — H1852 Epithelial (juvenile) corneal dystrophy: Secondary | ICD-10-CM | POA: Diagnosis not present

## 2016-05-22 DIAGNOSIS — H25813 Combined forms of age-related cataract, bilateral: Secondary | ICD-10-CM | POA: Diagnosis not present

## 2016-06-16 DIAGNOSIS — I7300 Raynaud's syndrome without gangrene: Secondary | ICD-10-CM | POA: Diagnosis not present

## 2016-06-16 DIAGNOSIS — L98499 Non-pressure chronic ulcer of skin of other sites with unspecified severity: Secondary | ICD-10-CM | POA: Diagnosis not present

## 2016-06-16 DIAGNOSIS — K219 Gastro-esophageal reflux disease without esophagitis: Secondary | ICD-10-CM | POA: Diagnosis not present

## 2016-06-16 DIAGNOSIS — M340 Progressive systemic sclerosis: Secondary | ICD-10-CM | POA: Diagnosis not present

## 2016-06-16 DIAGNOSIS — M810 Age-related osteoporosis without current pathological fracture: Secondary | ICD-10-CM | POA: Diagnosis not present

## 2016-06-16 DIAGNOSIS — M150 Primary generalized (osteo)arthritis: Secondary | ICD-10-CM | POA: Diagnosis not present

## 2016-06-16 DIAGNOSIS — Z681 Body mass index (BMI) 19 or less, adult: Secondary | ICD-10-CM | POA: Diagnosis not present

## 2016-06-26 ENCOUNTER — Encounter: Payer: Self-pay | Admitting: Family Medicine

## 2016-06-26 ENCOUNTER — Ambulatory Visit (INDEPENDENT_AMBULATORY_CARE_PROVIDER_SITE_OTHER): Payer: Medicare Other | Admitting: Family Medicine

## 2016-06-26 ENCOUNTER — Other Ambulatory Visit: Payer: Self-pay | Admitting: Nurse Practitioner

## 2016-06-26 VITALS — BP 156/71 | HR 71 | Temp 99.0°F | Ht 61.0 in | Wt 103.2 lb

## 2016-06-26 DIAGNOSIS — R52 Pain, unspecified: Secondary | ICD-10-CM

## 2016-06-26 DIAGNOSIS — R0981 Nasal congestion: Secondary | ICD-10-CM | POA: Diagnosis not present

## 2016-06-26 DIAGNOSIS — E039 Hypothyroidism, unspecified: Secondary | ICD-10-CM

## 2016-06-26 DIAGNOSIS — I7301 Raynaud's syndrome with gangrene: Secondary | ICD-10-CM | POA: Diagnosis not present

## 2016-06-26 DIAGNOSIS — R059 Cough, unspecified: Secondary | ICD-10-CM

## 2016-06-26 DIAGNOSIS — R05 Cough: Secondary | ICD-10-CM | POA: Diagnosis not present

## 2016-06-26 LAB — VERITOR FLU A/B WAIVED
INFLUENZA B: NEGATIVE
Influenza A: NEGATIVE

## 2016-06-26 MED ORDER — DOXYCYCLINE HYCLATE 100 MG PO TABS: 100.0000 mg | ORAL_TABLET | Freq: Two times a day (BID) | ORAL | 0 refills | Status: DC

## 2016-06-26 NOTE — Progress Notes (Signed)
   HPI  Patient presents today with cough, fever, diarrhea, nasal congestion, and body aches.  She's had a fever measured to 100.  She states her symptoms are gone on for about 2 days. She's been very anxious lately due to the death of her grandson.  Patient states cough is dry. She denies any severe dyspnea. She denies chest pain except for that related with anxiety from her grandson's death and the soreness that she has after her breast surgery a couple months ago.  She is tolerating food and fluids normally but has decreased appetite.  She states that her left second finger ulcer has been getting worse with some redness in the morning. She has a history of gangrene from Raynaud's phenomenon due to scleroderma.  Patient was treated with a seven-day course of doxycycline about one week ago, she has difficulty tolerating many medications and would like to try doxycycline again. Neither the symptoms started the last 2 days, not while she was covered with doxycycline.  PMH: Smoking status noted ROS: Per HPI  Objective: BP (!) 156/71   Pulse 71   Temp 99 F (37.2 C) (Oral)   Ht 5\' 1"  (1.549 m)   Wt 103 lb 3.2 oz (46.8 kg)   BMI 19.50 kg/m  Gen: NAD, alert, cooperative with exam HEENT: NCAT, mild tenderness to palpation of bilateral maxillary sinuses CV: RRR, good S1/S2, no murmur Resp: CTABL, no wheezes, non-labored Ext: No edema, warm Neuro: Alert and oriented, No gross deficits  Skin Right second digit with small, approximately 4 mm in diameter circular crusted lesion on the fingertip consistent with possible dry gangrene. Mild erythema on the fingertip extending to the DIP and mild tenderness to palpation  Assessment and plan:  # Cough, body aches Flulike illness, however also with maxillary sinus tenderness as well as left second finger ulcer that is possibly dry gangrene. Treating with doxycycline which could easily cover bacterial effects of the dry gangrene as well as  pneumonia. Her lung exam is reassuring Influenza testing today is negative.   # [name] I'm concerned of possible dry gangrene in the left second finger Treat with doxycycline to rule out infectious etiology Very low threshold for follow-up as well as low threshold for follow-up with her rheumatologist.     Orders Placed This Encounter  Procedures  . Veritor Flu A/B Waived    Order Specific Question:   Source    Answer:   nose    Meds ordered this encounter  Medications  . doxycycline (VIBRA-TABS) 100 MG tablet    Sig: Take 1 tablet (100 mg total) by mouth 2 (two) times daily. 1 po bid    Dispense:  20 tablet    Refill:  0    Laroy Apple, MD [facility] 06/26/2016, 11:54 AM

## 2016-06-26 NOTE — Patient Instructions (Signed)
Great to see you!  Please call or come back if anything is getting worse.   I am treating you with doxycycline as it works broadly and can treat your finger as well as a possible lung infection.

## 2016-07-04 ENCOUNTER — Encounter: Payer: Self-pay | Admitting: Nurse Practitioner

## 2016-07-04 ENCOUNTER — Ambulatory Visit (INDEPENDENT_AMBULATORY_CARE_PROVIDER_SITE_OTHER): Payer: Medicare Other | Admitting: Nurse Practitioner

## 2016-07-04 VITALS — BP 139/70 | HR 78 | Temp 98.5°F | Ht 61.0 in | Wt 102.0 lb

## 2016-07-04 DIAGNOSIS — M349 Systemic sclerosis, unspecified: Secondary | ICD-10-CM

## 2016-07-04 DIAGNOSIS — I10 Essential (primary) hypertension: Secondary | ICD-10-CM

## 2016-07-04 DIAGNOSIS — E039 Hypothyroidism, unspecified: Secondary | ICD-10-CM

## 2016-07-04 DIAGNOSIS — I7301 Raynaud's syndrome with gangrene: Secondary | ICD-10-CM | POA: Diagnosis not present

## 2016-07-04 DIAGNOSIS — E785 Hyperlipidemia, unspecified: Secondary | ICD-10-CM

## 2016-07-04 DIAGNOSIS — F3342 Major depressive disorder, recurrent, in full remission: Secondary | ICD-10-CM | POA: Diagnosis not present

## 2016-07-04 DIAGNOSIS — K219 Gastro-esophageal reflux disease without esophagitis: Secondary | ICD-10-CM | POA: Diagnosis not present

## 2016-07-04 DIAGNOSIS — M159 Polyosteoarthritis, unspecified: Secondary | ICD-10-CM | POA: Diagnosis not present

## 2016-07-04 MED ORDER — SYNTHROID 50 MCG PO TABS: ORAL_TABLET | ORAL | 1 refills | Status: DC

## 2016-07-04 MED ORDER — FLUOXETINE HCL 40 MG PO CAPS: ORAL_CAPSULE | ORAL | 1 refills | Status: DC

## 2016-07-04 MED ORDER — OMEPRAZOLE 20 MG PO CPDR: 20.0000 mg | DELAYED_RELEASE_CAPSULE | Freq: Every day | ORAL | 1 refills | Status: DC

## 2016-07-04 MED ORDER — NIFEDIPINE ER OSMOTIC RELEASE 90 MG PO TB24: 90.0000 mg | ORAL_TABLET | Freq: Every day | ORAL | 1 refills | Status: DC

## 2016-07-04 MED ORDER — SULFAMETHOXAZOLE-TRIMETHOPRIM 800-160 MG PO TABS
1.0000 | ORAL_TABLET | Freq: Two times a day (BID) | ORAL | 0 refills | Status: DC
Start: 2016-07-04 — End: 2016-07-28

## 2016-07-04 MED ORDER — CLONAZEPAM 0.5 MG PO TABS: ORAL_TABLET | ORAL | 1 refills | Status: DC

## 2016-07-04 NOTE — Progress Notes (Addendum)
Subjective:    Patient ID: Haley Trevino, female    DOB: 19-Sep-1940, 75 y.o.   MRN: [id number]  HPI  Patient here today for follow up of chronic medical problems. She saw Dr. Wendi Snipes last week with fever cough and congestion- tested negative for flu-was given doxycycline. She is slowly getting better.  Outpatient Encounter Prescriptions as of 12/31/2015  Medication Sig  . clonazePAM (KLONOPIN) 0.5 MG tablet TAKE 1 TABLET TWICE DAILY AS NEEDED FOR ANXIETY  . Ferrous Sulfate Dried (SLOW RELEASE IRON) 45 MG TBCR Take 1 tablet by mouth daily.  Marland Kitchen FLUoxetine (PROZAC) 40 MG capsule TAKE (1) CAPSULE DAILY  . NIFEdipine (PROCARDIA XL/ADALAT-CC) 90 MG 24 hr tablet TAKE 1 TABLET DAILY  . sildenafil (REVATIO) 20 MG tablet Take 20 mg by mouth daily as needed (not prn).   . SYNTHROID 50 MCG tablet Take 1 tablet (50 mcg total) by mouth daily before breakfast.   Sclerderma Getting worse- sees specialist at [facility]- They are really doing nothing new with her. Raynauds Still has bad nerve pain and hurts to just touch anything. Can not tolerate gabapentin or lyrica. She has an infection on the left fifth finger and doxycycline is not helping. Hypothyroidism SYnthroid 27mg and doing well with that. Has fatigue but she thinks that is from everything nit just thyroid problems. Depression Decided to take a break from it because she has been on it so long- Sees psychiatrist next tomorrow and will probably start back on  It tomorrow. Hyperlipidemia-  Welchol is the only thing she can tolerate- statins make her hurt. She doe not watch diet at all. Hypertension On procardia which helps greatly with blood pressure and raynauds. Gerd Omeprazole works well to keep symptoms under control. BUt patient decide to stop taking and just use zantac OTC as needed. osteoarthritis Worse in left shoulder- was given ultram by Dr. BElpidio Galea has appointment with shoulder secialist and may decide to have surgery.  * Patient  had breast implants removed that had ruptured- she is doing well.  Review of Systems  Constitutional: Negative.   HENT: Negative.   Respiratory: Negative.   Cardiovascular: Negative.   Gastrointestinal: Negative.   Genitourinary: Negative.   Neurological: Negative.   Psychiatric/Behavioral: Negative.   All other systems reviewed and are negative.      Objective:   Physical Exam  Constitutional: She is oriented to person, place, and time. She appears well-developed and well-nourished.  Weight down 3 more lbs.  HENT:  Nose: Nose normal.  Mouth/Throat: Oropharynx is clear and moist.  Eyes: EOM are normal.  Neck: Trachea normal, normal range of motion and full passive range of motion without pain. Neck supple. No JVD present. Carotid bruit is not present. No thyromegaly present.  Cardiovascular: Normal rate, regular rhythm, normal heart sounds and intact distal pulses.  Exam reveals no gallop and no friction rub.   No murmur heard. Tips of fingers are cold to touch with slow cap refill. Palms of hands are paler then they have been Tender 2cm lesion tip of left fifth finger.  Pulmonary/Chest: Effort normal and breath sounds normal.  bil breast implants are hard but left breast has soft pocket underneath that has developed in the last 2 months.  Abdominal: Soft. Bowel sounds are normal. She exhibits no distension and no mass. There is no tenderness.  Musculoskeletal: Normal range of motion.  limps with gait but improving  Lymphadenopathy:    She has no cervical adenopathy.  Neurological: She is  alert and oriented to person, place, and time. She has normal reflexes.  Skin: Skin is warm and dry.  Psychiatric: She has a normal mood and affect. Her behavior is normal. Judgment and thought content normal.    BP 139/70   Pulse 78   Temp 98.5 F (36.9 C) (Oral)   Ht '5\' 1"'  (1.549 m)   Wt 102 lb (46.3 kg)   SpO2 99%   BMI 19.27 kg/m      Assessment & Plan:  1. Essential  hypertension Low sodium diet - NIFEdipine (PROCARDIA XL/ADALAT-CC) 90 MG 24 hr tablet; Take 1 tablet (90 mg total) by mouth daily.  Dispense: 90 tablet; Refill: 1 - CMP14+EGFR  2. [name] with gangrene ([place]) Follow up with Dr. Elpidio Galea - sulfamethoxazole-trimethoprim (BACTRIM DS) 800-160 MG tablet; Take 1 tablet by mouth 2 (two) times daily.  Dispense: 14 tablet; Refill: 0  3. Gastroesophageal reflux disease without esophagitis Avoid spicy foods Do not eat 2 hours prior to bedtime - omeprazole (PRILOSEC) 20 MG capsule; Take 1 capsule (20 mg total) by mouth daily.  Dispense: 90 capsule; Refill: 1  4. Osteoarthritis of multiple joints, unspecified osteoarthritis type Weight bearing exercises  5. Recurrent major depressive disorder, in full remission (HCC) - FLUoxetine (PROZAC) 40 MG capsule; TAKE (1) CAPSULE DAILY  Dispense: 90 capsule; Refill: 1 - clonazePAM (KLONOPIN) 0.5 MG tablet; TAKE 1 TABLET TWICE DAILY AS NEEDED FOR ANXIETY  Dispense: 60 tablet; Refill: 1  6. Hyperlipidemia with target LDL less than 100 Low fat diet - Lipid panel  7. SCLERODERMA Keep follow up with specialist  8. Acquired hypothyroidism - SYNTHROID 50 MCG tablet; TAKE (1) TABLET DAILY BE- FORE BREAKFAST.  Dispense: 90 tablet; Refill: 1    Labs pending Health maintenance reviewed Diet and exercise encouraged Continue all meds Follow up  In 3 months   [place], FNP

## 2016-07-04 NOTE — Patient Instructions (Signed)

## 2016-07-05 LAB — CMP14+EGFR
A/G RATIO: 1.5 (ref 1.2–2.2)
ALK PHOS: 59 IU/L (ref 39–117)
ALT: 10 IU/L (ref 0–32)
AST: 17 IU/L (ref 0–40)
Albumin: 4.1 g/dL (ref 3.5–4.8)
BUN / CREAT RATIO: 19 (ref 12–28)
BUN: 15 mg/dL (ref 8–27)
CHLORIDE: 102 mmol/L (ref 96–106)
CO2: 23 mmol/L (ref 18–29)
Calcium: 9.3 mg/dL (ref 8.7–10.3)
Creatinine, Ser: 0.81 mg/dL (ref 0.57–1.00)
GFR calc non Af Amer: 72 mL/min/{1.73_m2} (ref >59)
GFR, EST AFRICAN AMERICAN: 83 mL/min/{1.73_m2} (ref >59)
GLUCOSE: 89 mg/dL (ref 65–99)
Globulin, Total: 2.8 g/dL (ref 1.5–4.5)
POTASSIUM: 3.9 mmol/L (ref 3.5–5.2)
Sodium: 142 mmol/L (ref 134–144)
TOTAL PROTEIN: 6.9 g/dL (ref 6.0–8.5)

## 2016-07-05 LAB — LIPID PANEL
CHOLESTEROL TOTAL: 194 mg/dL (ref 100–199)
Chol/HDL Ratio: 4.5 ratio units — ABNORMAL HIGH (ref 0.0–4.4)
HDL: 43 mg/dL (ref >39)
LDL Calculated: 111 mg/dL — ABNORMAL HIGH (ref 0–99)
Triglycerides: 198 mg/dL — ABNORMAL HIGH (ref 0–149)
VLDL CHOLESTEROL CAL: 40 mg/dL (ref 5–40)

## 2016-07-28 ENCOUNTER — Ambulatory Visit (INDEPENDENT_AMBULATORY_CARE_PROVIDER_SITE_OTHER): Payer: Medicare Other | Admitting: Nurse Practitioner

## 2016-07-28 ENCOUNTER — Encounter: Payer: Self-pay | Admitting: Nurse Practitioner

## 2016-07-28 VITALS — BP 144/76 | HR 77 | Temp 98.7°F | Ht 61.0 in | Wt 102.0 lb

## 2016-07-28 DIAGNOSIS — M436 Torticollis: Secondary | ICD-10-CM

## 2016-07-28 DIAGNOSIS — M542 Cervicalgia: Secondary | ICD-10-CM | POA: Diagnosis not present

## 2016-07-28 MED ORDER — HYDROCODONE-ACETAMINOPHEN 5-325 MG PO TABS: 1.0000 | ORAL_TABLET | Freq: Four times a day (QID) | ORAL | 0 refills | Status: DC | PRN

## 2016-07-28 MED ORDER — CYCLOBENZAPRINE HCL 5 MG PO TABS: 5.0000 mg | ORAL_TABLET | Freq: Three times a day (TID) | ORAL | 1 refills | Status: DC | PRN

## 2016-07-28 MED ORDER — KETOROLAC TROMETHAMINE 60 MG/2ML IM SOLN
60.0000 mg | Freq: Once | INTRAMUSCULAR | Status: AC
  Administered 2016-07-28: 60 mg via INTRAMUSCULAR

## 2016-07-28 NOTE — Progress Notes (Addendum)
   Subjective:    Patient ID: Haley Trevino, female    DOB: 1941-02-08, 75 y.o.   MRN: [id number]  HPI  Patient comes in today c/o right sided neck pain- it started last Thursday when she was helping her husband move some boxes of christmas decorations. Pain has increasing gotten worse and seems to be radiating up into the top of her her head. SHe rates pain a 10/10 currently and pain worsens with movement. She has tried heat which helsp a little.   Review of Systems  Constitutional: Negative.   Eyes: Negative for photophobia and visual disturbance.  Respiratory: Negative.   Cardiovascular: Negative.   Musculoskeletal: Positive for neck pain.  Neurological: Positive for headaches.  Psychiatric/Behavioral: Negative.   All other systems reviewed and are negative.      Objective:   Physical Exam  Constitutional: She is oriented to person, place, and time. She appears well-developed and well-nourished. She appears distressed.  Cardiovascular: Normal rate, regular rhythm and normal heart sounds.   Pulmonary/Chest: Effort normal and breath sounds normal.  Musculoskeletal:  Pain on palpation of right sternocleidomastoid muscle Decrease ROM of cervical spine with pain with any movement- rotation causes most pain Grips equal bil ,\motor strength and sensation upper ext equal bil  Neurological: She is alert and oriented to person, place, and time. She has normal reflexes. No cranial nerve deficit.  Skin: Skin is warm.  Psychiatric: She has a normal mood and affect. Her behavior is normal. Judgment and thought content normal.   BP (!) 144/76   Pulse 77   Temp 98.7 F (37.1 C) (Oral)   Ht 5\' 1"  (1.549 m)   Wt 102 lb (46.3 kg)   BMI 19.27 kg/m         Assessment & Plan:  1. Acute muscle stiffness of neck Moist heat to neck No heavy lifting Good neck support at night when sleeping RTO prn  Meds ordered this encounter  Medications  . sildenafil (REVATIO) 20 MG tablet   Sig: Take 20 mg by mouth 3 (three) times daily.  Marland Kitchen ketorolac (TORADOL) injection 60 mg  . cyclobenzaprine (FLEXERIL) 5 MG tablet    Sig: Take 1 tablet (5 mg total) by mouth 3 (three) times daily as needed for muscle spasms.    Dispense:  30 tablet    Refill:  1    Order Specific Question:   Supervising Provider    Answer:   VINCENT, CAROL L [[id number]]  . HYDROcodone-acetaminophen (LORTAB) 5-325 MG tablet    Sig: Take 1 tablet by mouth every 6 (six) hours as needed for moderate pain.    Dispense:  40 tablet    Refill:  0    Order Specific Question:   Supervising Provider    Answer:   Eustaquio Maize [id number]   * Patient said she is allergic to the old codeine she has taken hydrocodone in the last 2 years when she had her hip surgery.   Mary-Margaret Hassell Done, FNP

## 2016-07-28 NOTE — Addendum Note (Signed)
Addended by: [name] on: 07/28/2016 05:07 PM   Modules accepted: Orders

## 2016-07-28 NOTE — Patient Instructions (Signed)
Acute Torticollis Torticollis is a condition in which the muscles of the neck tighten (contract) abnormally, causing the neck to twist and the head to move into an unnatural position. Torticollis that develops suddenly is called acute torticollis. If torticollis becomes chronic and is left untreated, the face and neck can become deformed. CAUSES This condition may be caused by:  Sleeping in an awkward position (common).  Extending or twisting the neck muscles beyond their normal position.  Infection. In some cases, the cause may not be known. SYMPTOMS Symptoms of this condition include:  An unnatural position of the head.  Neck pain.  A limited ability to move the neck.  Twisting of the neck to one side. DIAGNOSIS This condition is diagnosed with a physical exam. You may also have imaging tests, such as an X-ray, CT scan, or MRI. TREATMENT Treatment for this condition involves trying to relax the neck muscles. It may include:  Medicines or shots.  Physical therapy.  Surgery. This may be done in severe cases. HOME CARE INSTRUCTIONS  Take medicines only as directed by your health care provider.  Do stretching exercises and massage your neck as directed by your health care provider.  Keep all follow-up visits as directed by your health care provider. This is important. SEEK MEDICAL CARE IF:  You develop a fever. SEEK IMMEDIATE MEDICAL CARE IF:  You develop difficulty breathing.  You develop noisy breathing (stridor).  You start drooling.  You have trouble swallowing or have pain with swallowing.  You develop numbness or weakness in your hands or feet.  You have changes in your speech, understanding, or vision.  Your pain gets worse. This information is not intended to replace advice given to you by your health care provider. Make sure you discuss any questions you have with your health care provider. Document Released: 06/20/2000 Document Revised: 10/15/2015  Document Reviewed: 06/19/2014 Elsevier Interactive Patient Education  2017 Elsevier Inc.  

## 2016-08-01 ENCOUNTER — Ambulatory Visit (INDEPENDENT_AMBULATORY_CARE_PROVIDER_SITE_OTHER): Payer: Medicare Other | Admitting: Nurse Practitioner

## 2016-08-01 ENCOUNTER — Encounter: Payer: Self-pay | Admitting: Nurse Practitioner

## 2016-08-01 VITALS — BP 127/73 | HR 71 | Temp 99.1°F | Ht 61.0 in | Wt 106.0 lb

## 2016-08-01 DIAGNOSIS — M542 Cervicalgia: Secondary | ICD-10-CM

## 2016-08-01 MED ORDER — PREDNISONE 10 MG (21) PO TBPK: ORAL_TABLET | ORAL | 0 refills | Status: DC

## 2016-08-01 NOTE — Progress Notes (Signed)
   Subjective:    Patient ID: Haley Trevino, female    DOB: Aug 25, 1940, 76 y.o.   MRN: [id number]  HPI  Patient comes back today still c/o neck pain- she was seen on 07/28/16 and was given a toradol shot, flexeril and lortabs. She said the tordol helped but in 2 days she was back the same- last night she hurt pretty bad. She says pain starts in right side of neck and rdaiates upward.   Review of Systems  Constitutional: Negative.   HENT: Negative.   Respiratory: Negative.   Cardiovascular: Negative.   Musculoskeletal: Positive for neck pain.  Allergic/Immunologic: Negative.   Neurological: Positive for headaches.  Psychiatric/Behavioral: Negative.   All other systems reviewed and are negative.      Objective:   Physical Exam  Constitutional: She is oriented to person, place, and time. She appears well-nourished. She appears distressed (mild).  Cardiovascular: Normal rate.   Pulmonary/Chest: Effort normal and breath sounds normal.  Musculoskeletal:  pain on rotation of cervical spine. Pain along sternocleidomastoid muscle on palpation  Neurological: She is oriented to person, place, and time.  Skin: Skin is warm.  Psychiatric: She has a normal mood and affect. Her behavior is normal. Judgment and thought content normal.   BP 127/73   Pulse 71   Temp 99.1 F (37.3 C) (Oral)   Ht 5\' 1"  (1.549 m)   Wt 106 lb (48.1 kg)   BMI 20.03 kg/m         Assessment & Plan:   1. Neck pain    Meds ordered this encounter  Medications  . predniSONE (STERAPRED UNI-PAK 21 TAB) 10 MG (21) TBPK tablet    Sig: As directed x 6 days    Dispense:  21 tablet    Refill:  0    Order Specific Question:   Supervising Provider    Answer:   VINCENT, CAROL L [[id number]]   Continue moist heat If no better will need to do some scans of cervical spine.  Mary-Margaret Hassell Done, FNP

## 2016-10-07 ENCOUNTER — Ambulatory Visit (INDEPENDENT_AMBULATORY_CARE_PROVIDER_SITE_OTHER): Payer: Medicare Other

## 2016-10-07 ENCOUNTER — Ambulatory Visit (INDEPENDENT_AMBULATORY_CARE_PROVIDER_SITE_OTHER): Payer: Medicare Other | Admitting: Nurse Practitioner

## 2016-10-07 ENCOUNTER — Encounter: Payer: Self-pay | Admitting: Nurse Practitioner

## 2016-10-07 VITALS — BP 130/59 | HR 62 | Temp 97.9°F | Ht 61.0 in | Wt 111.0 lb

## 2016-10-07 DIAGNOSIS — I10 Essential (primary) hypertension: Secondary | ICD-10-CM | POA: Diagnosis not present

## 2016-10-07 DIAGNOSIS — M349 Systemic sclerosis, unspecified: Secondary | ICD-10-CM | POA: Diagnosis not present

## 2016-10-07 DIAGNOSIS — F3342 Major depressive disorder, recurrent, in full remission: Secondary | ICD-10-CM | POA: Diagnosis not present

## 2016-10-07 DIAGNOSIS — I7301 Raynaud's syndrome with gangrene: Secondary | ICD-10-CM | POA: Diagnosis not present

## 2016-10-07 DIAGNOSIS — E785 Hyperlipidemia, unspecified: Secondary | ICD-10-CM | POA: Diagnosis not present

## 2016-10-07 DIAGNOSIS — M810 Age-related osteoporosis without current pathological fracture: Secondary | ICD-10-CM

## 2016-10-07 DIAGNOSIS — E039 Hypothyroidism, unspecified: Secondary | ICD-10-CM

## 2016-10-07 DIAGNOSIS — K219 Gastro-esophageal reflux disease without esophagitis: Secondary | ICD-10-CM | POA: Diagnosis not present

## 2016-10-07 NOTE — Patient Instructions (Signed)
Raynaud Phenomenon Raynaud phenomenon is a condition that affects the blood vessels (arteries) that carry blood to your fingers and toes. The arteries that supply blood to your ears or the tip of your nose might also be affected. Raynaud phenomenon causes the arteries to temporarily narrow. As a result, the flow of blood to the affected areas is temporarily decreased. This usually occurs in response to cold temperatures or stress. During an attack, the skin in the affected areas turns white. You may also feel tingling or numbness in those areas. Attacks usually last for only a brief period, and then the blood flow to the area returns to normal. In most cases, Raynaud phenomenon does not cause serious health problems. What are the causes? For many people with this condition, the cause is not known. Raynaud phenomenon is sometimes associated with other diseases, such as scleroderma or lupus. What increases the risk? Raynaud phenomenon can affect anyone, but it develops most often in people who are 20-40 years old. It affects more females than males. What are the signs or symptoms? Symptoms of Raynaud phenomenon may occur when you are exposed to cold temperatures or when you have emotional stress. The symptoms may last for a few minutes or up to several hours. They usually affect your fingers but may also affect your toes, ears, or the tip of your nose. Symptoms may include:  Changes in skin color. The skin in the affected areas will turn pale or white. The skin may then change from white to bluish to red as normal blood flow returns to the area.  Numbness, tingling, or pain in the affected areas.  In severe cases, sores may develop in the affected areas. How is this diagnosed? Your health care provider will do a physical exam and take your medical history. You may be asked to put your hands in cold water to check for a reaction to cold temperature. Blood tests may be done to check for other diseases or  conditions. Your health care provider may also order a test to check the movement of blood through your arteries and veins (vascular ultrasound). How is this treated? Treatment often involves making lifestyle changes and taking steps to control your exposure to cold temperatures. For more severe cases, medicine (calcium channel blockers) may be used to improve blood flow. Surgery is sometimes done to block the nerves that control the affected arteries, but this is rare. Follow these instructions at home:  Avoid exposure to cold by taking these steps: ? If possible, stay indoors during cold weather. ? When you go outside during cold weather, dress in layers and wear mittens, a hat, a scarf, and warm footwear. ? Wear mittens or gloves when handling ice or frozen food. ? Use holders for glasses or cans containing cold drinks. ? Let warm water run for a while before taking a shower or bath. ? Warm up the car before driving in cold weather.  If possible, avoid stressful and emotional situations. Exercise, meditation, and yoga may help you cope with stress. Biofeedback may be useful.  Do not use any tobacco products, including cigarettes, chewing tobacco, or electronic cigarettes. If you need help quitting, ask your health care provider.  Avoid secondhand smoke.  Limit your use of caffeine. Switch to decaffeinated coffee, tea, and soda. Avoid chocolate.  Wear loose fitting socks and comfortable, roomy shoes.  Avoid vibrating tools and machinery.  Take medicines only as directed by your health care provider. Contact a health care provider if:    Your discomfort becomes worse despite lifestyle changes.  You develop sores on your fingers or toes that do not heal.  Your fingers or toes turn black.  You have breaks in the skin on your fingers or toes.  You have a fever.  You have pain or swelling in your joints.  You have a rash.  Your symptoms occur on only one side of your body. This  information is not intended to replace advice given to you by your health care provider. Make sure you discuss any questions you have with your health care provider. Document Released: 06/20/2000 Document Revised: 11/29/2015 Document Reviewed: 12/26/2015 Elsevier Interactive Patient Education  2017 [employer]vier Inc.  

## 2016-10-07 NOTE — Progress Notes (Signed)
Subjective:    Patient ID: Haley Trevino, female    DOB: February 05, 1941, 76 y.o.   MRN: [id number]  HPI  Haley Trevino is here today for follow up of chronic medical problem.  Outpatient Encounter Prescriptions as of 10/07/2016  Medication Sig  . clonazePAM (KLONOPIN) 0.5 MG tablet TAKE 1 TABLET TWICE DAILY AS NEEDED FOR ANXIETY  . Ferrous Sulfate Dried (SLOW RELEASE IRON) 45 MG TBCR Take 1 tablet by mouth daily.  Marland Kitchen FLUoxetine (PROZAC) 40 MG capsule TAKE (1) CAPSULE DAILY  . NIFEdipine (PROCARDIA XL/ADALAT-CC) 90 MG 24 hr tablet Take 1 tablet (90 mg total) by mouth daily.  Marland Kitchen omeprazole (PRILOSEC) 20 MG capsule Take 1 capsule (20 mg total) by mouth daily.  . sildenafil (REVATIO) 20 MG tablet Take 20 mg by mouth 3 (three) times daily.  Marland Kitchen SYNTHROID 50 MCG tablet TAKE (1) TABLET DAILY BE- FORE BREAKFAST.    1. Essential hypertension  On procardia which is probably more or her raynauds then blood pressure- she does not check blood pressure at home  2. [name] with gangrene ([place])  Procardia and sildenafil helps some but they are really getting bad- gets frequent ulcers on fingers- keeps hand warmers in hands at all times.  3. Gastroesophageal reflux disease without esophagitis  Has occasional heart burn but does not have if she avoids greasy foods- on omeprazole  4. Hypothyroidism, unspecified type  No problems  5. Recurrent major depressive disorder, in full remission ([place])  In remission right now- on prozac without problems  6. Hyperlipidemia with target LDL less than 100  Eats low fat diet  7. SCLERODERMA  Sees specialist at [facility]- no recent changes- she sees specialist every 3-4 months  8.       Osteoporosis           Needs dexa scan- denies back pain  New complaints: No complaints today     Review of Systems  Constitutional: Negative.   HENT: Negative.   Respiratory: Negative.   Cardiovascular: Negative.   Gastrointestinal: Negative.   Genitourinary:  Negative.   Musculoskeletal: Positive for arthralgias and joint swelling.  Neurological: Negative.   Psychiatric/Behavioral: Negative.   All other systems reviewed and are negative.      Objective:   Physical Exam  Constitutional: She is oriented to person, place, and time. She appears well-developed and well-nourished.  HENT:  Nose: Nose normal.  Mouth/Throat: Oropharynx is clear and moist.  Eyes: EOM are normal.  Neck: Trachea normal, normal range of motion and full passive range of motion without pain. Neck supple. No JVD present. Carotid bruit is not present. No thyromegaly present.  Cardiovascular: Normal rate, regular rhythm, normal heart sounds and intact distal pulses.  Exam reveals no gallop and no friction rub.   No murmur heard. Pulmonary/Chest: Effort normal and breath sounds normal.  Abdominal: Soft. Bowel sounds are normal. She exhibits no distension and no mass. There is no tenderness.  Musculoskeletal: Normal range of motion.  Lymphadenopathy:    She has no cervical adenopathy.  Neurological: She is alert and oriented to person, place, and time. She has normal reflexes.  Skin: Skin is warm and dry.  Tips of finger are cyanotic and cold to touch  Psychiatric: She has a normal mood and affect. Her behavior is normal. Judgment and thought content normal.   BP (!) 130/59   Pulse 62   Temp 97.9 F (36.6 C) (Oral)   Ht '5\' 1"'  (1.549 m)  Wt 111 lb (50.3 kg)   BMI 20.97 kg/m       Assessment & Plan:  1. Essential hypertension Low sodium diet - CMP14+EGFR  2. [name] with gangrene ([place]) Keep follow up with Dr. Verita Schneiders  3. Gastroesophageal reflux disease without esophagitis Avoid spicy foods Do not eat 2 hours prior to bedtime  4. Hypothyroidism, unspecified type - Thyroid Panel With TSH  5. Recurrent major depressive disorder, in full remission [facility]) Stress management  6. Hyperlipidemia with target LDL less than 100 Low fat diet - Lipid  panel  7. SCLERODERMA Keep follow up with specialist  8. Age-related osteoporosis without current pathological fracture Results pending - [facility] DEXA    Labs pending Health maintenance reviewed Diet and exercise encouraged Continue all meds Follow up  In 3 month   Arnold, FNP

## 2016-10-08 LAB — CMP14+EGFR
ALK PHOS: 58 IU/L (ref 39–117)
ALT: 8 IU/L (ref 0–32)
AST: 15 IU/L (ref 0–40)
Albumin/Globulin Ratio: 1.4 (ref 1.2–2.2)
Albumin: 4.2 g/dL (ref 3.5–4.8)
BUN/Creatinine Ratio: 13 (ref 12–28)
BUN: 9 mg/dL (ref 8–27)
Bilirubin Total: <0.2 mg/dL (ref 0.0–1.2)
CHLORIDE: 103 mmol/L (ref 96–106)
CO2: 23 mmol/L (ref 18–29)
CREATININE: 0.69 mg/dL (ref 0.57–1.00)
Calcium: 9 mg/dL (ref 8.7–10.3)
GFR calc Af Amer: 98 mL/min/{1.73_m2} (ref >59)
GFR calc non Af Amer: 85 mL/min/{1.73_m2} (ref >59)
GLOBULIN, TOTAL: 3 g/dL (ref 1.5–4.5)
GLUCOSE: 84 mg/dL (ref 65–99)
Potassium: 3.9 mmol/L (ref 3.5–5.2)
SODIUM: 143 mmol/L (ref 134–144)
Total Protein: 7.2 g/dL (ref 6.0–8.5)

## 2016-10-08 LAB — LIPID PANEL
CHOLESTEROL TOTAL: 196 mg/dL (ref 100–199)
Chol/HDL Ratio: 4.1 ratio (ref 0.0–4.4)
HDL: 48 mg/dL (ref >39)
LDL CALC: 117 mg/dL — AB (ref 0–99)
TRIGLYCERIDES: 155 mg/dL — AB (ref 0–149)
VLDL Cholesterol Cal: 31 mg/dL (ref 5–40)

## 2016-10-08 LAB — THYROID PANEL WITH TSH
FREE THYROXINE INDEX: 1.7 (ref 1.2–4.9)
T3 UPTAKE RATIO: 23 % — AB (ref 24–39)
T4, Total: 7.4 ug/dL (ref 4.5–12.0)
TSH: 7.38 u[IU]/mL — ABNORMAL HIGH (ref 0.450–4.500)

## 2016-10-09 ENCOUNTER — Other Ambulatory Visit: Payer: Self-pay | Admitting: Nurse Practitioner

## 2016-10-09 MED ORDER — SYNTHROID 75 MCG PO TABS: 75.0000 ug | ORAL_TABLET | Freq: Every day | ORAL | 5 refills | Status: DC

## 2016-11-18 ENCOUNTER — Other Ambulatory Visit: Payer: Self-pay | Admitting: Pediatrics

## 2016-11-18 DIAGNOSIS — F3342 Major depressive disorder, recurrent, in full remission: Secondary | ICD-10-CM

## 2016-11-18 NOTE — Telephone Encounter (Signed)
Please call in clonazepam with 1 refills 

## 2016-11-19 NOTE — Telephone Encounter (Signed)
Clonipin script called to pharmacy.

## 2017-01-06 ENCOUNTER — Ambulatory Visit (INDEPENDENT_AMBULATORY_CARE_PROVIDER_SITE_OTHER): Payer: Medicare Other | Admitting: Nurse Practitioner

## 2017-01-06 ENCOUNTER — Encounter: Payer: Self-pay | Admitting: Nurse Practitioner

## 2017-01-06 VITALS — BP 121/62 | HR 75 | Temp 98.7°F | Ht 61.0 in | Wt 106.0 lb

## 2017-01-06 DIAGNOSIS — M349 Systemic sclerosis, unspecified: Secondary | ICD-10-CM

## 2017-01-06 DIAGNOSIS — I10 Essential (primary) hypertension: Secondary | ICD-10-CM

## 2017-01-06 DIAGNOSIS — K219 Gastro-esophageal reflux disease without esophagitis: Secondary | ICD-10-CM

## 2017-01-06 DIAGNOSIS — F3342 Major depressive disorder, recurrent, in full remission: Secondary | ICD-10-CM | POA: Diagnosis not present

## 2017-01-06 DIAGNOSIS — E785 Hyperlipidemia, unspecified: Secondary | ICD-10-CM | POA: Diagnosis not present

## 2017-01-06 DIAGNOSIS — M159 Polyosteoarthritis, unspecified: Secondary | ICD-10-CM | POA: Diagnosis not present

## 2017-01-06 DIAGNOSIS — E039 Hypothyroidism, unspecified: Secondary | ICD-10-CM | POA: Diagnosis not present

## 2017-01-06 DIAGNOSIS — I7301 Raynaud's syndrome with gangrene: Secondary | ICD-10-CM | POA: Diagnosis not present

## 2017-01-06 MED ORDER — CLONAZEPAM 0.5 MG PO TABS: ORAL_TABLET | ORAL | 2 refills | Status: DC

## 2017-01-06 MED ORDER — FLUOXETINE HCL 40 MG PO CAPS: ORAL_CAPSULE | ORAL | 1 refills | Status: DC

## 2017-01-06 MED ORDER — OMEPRAZOLE 20 MG PO CPDR: 20.0000 mg | DELAYED_RELEASE_CAPSULE | Freq: Every day | ORAL | 1 refills | Status: DC

## 2017-01-06 MED ORDER — NIFEDIPINE ER OSMOTIC RELEASE 90 MG PO TB24: 90.0000 mg | ORAL_TABLET | Freq: Every day | ORAL | 1 refills | Status: DC

## 2017-01-06 NOTE — Progress Notes (Signed)
Subjective:    Patient ID: Haley Trevino, female    DOB: Sep 05, 1940, 76 y.o.   MRN: [id number]  HPI Haley Trevino is here today for follow up of chronic medical problem.  Outpatient Encounter Prescriptions as of 01/06/2017  Medication Sig  . clonazePAM (KLONOPIN) 0.5 MG tablet TAKE 1 TABLET TWICE DAILY AS NEEDED FOR ANXIETY  . Ferrous Sulfate Dried (SLOW RELEASE IRON) 45 MG TBCR Take 1 tablet by mouth daily.  Marland Kitchen FLUoxetine (PROZAC) 40 MG capsule TAKE (1) CAPSULE DAILY  . NIFEdipine (PROCARDIA XL/ADALAT-CC) 90 MG 24 hr tablet Take 1 tablet (90 mg total) by mouth daily.  Marland Kitchen omeprazole (PRILOSEC) 20 MG capsule Take 1 capsule (20 mg total) by mouth daily.  . sildenafil (REVATIO) 20 MG tablet Take 20 mg by mouth 3 (three) times daily.  Marland Kitchen SYNTHROID 75 MCG tablet Take 1 tablet (75 mcg total) by mouth daily before breakfast.   No facility-administered encounter medications on file as of 01/06/2017.     1. Essential hypertension  Patient takes Procardia for management.    2. Gastroesophageal reflux disease without esophagitis  Omeprazole keeps symptoms under control.  3. Hypothyroidism, unspecified type  Synthroid for management.  No current problems.  4. Osteoarthritis of multiple joints, unspecified osteoarthritis type  Patient has chronic back pain.  DEXA scan on 10/09/16.  5. Hyperlipidemia with target LDL less than 100  Manages with low fat diet.  6. Recurrent major depressive disorder, in full remission [facility])  Patient takes fluoxetine for symptom management.  Symptoms well-controlled.  Clonazepam for acute anxiety.  7. [name] with gangrene ([place])  Sildenafil and Procardia for management.  Patient states she continues to get sores on her hands and that they seem to be worse than before, but she is more active than usual  8. Scleroderma Patient sees a specialist at [facility] every 3-4 months.  No recent changes.  Patient sees specialist next month.    New complaints: Since  a hip fracture a few years ago, patient states she has pain that runs down her right leg.    Patient states she needs a shoulder replacement and that her shoulders hurt all the time.    Review of Systems  Constitutional: Positive for fatigue (more active than usual since husband's stroke). Negative for activity change and appetite change.  HENT: Negative.   Respiratory: Negative for shortness of breath and wheezing.   Cardiovascular: Negative for chest pain and palpitations.  Gastrointestinal: Negative for abdominal distention and abdominal pain.  Musculoskeletal: Positive for back pain (chronic).  Neurological: Negative for dizziness and headaches.  All other systems reviewed and are negative.      Objective:   Physical Exam  Constitutional: She is oriented to person, place, and time. She appears well-developed and well-nourished. No distress.  HENT:  Head: Normocephalic.  Right Ear: External ear normal.  Left Ear: External ear normal.  Mouth/Throat: Oropharynx is clear and moist.  Eyes: Pupils are equal, round, and reactive to light.  Neck: Normal range of motion. Neck supple. No JVD present. No thyromegaly present.  Cardiovascular: Normal rate, regular rhythm, normal heart sounds and intact distal pulses.   No murmur heard. Pulmonary/Chest: Effort normal and breath sounds normal. No respiratory distress. She has no wheezes.  Abdominal: Soft. Bowel sounds are normal. She exhibits no distension. There is no tenderness.  Musculoskeletal: Normal range of motion.  Lymphadenopathy:    She has no cervical adenopathy.  Neurological: She is alert and oriented to  person, place, and time.  Skin: Skin is warm and dry.  Skin on fingers is shiny and tight- small noninfected lesion on tip of right index finger and medial side of right midle finger  Psychiatric: She has a normal mood and affect. Her behavior is normal. Judgment and thought content normal.   BP 121/62   Pulse 75   Temp  98.7 F (37.1 C) (Oral)   Ht '5\' 1"'  (1.549 m)   Wt 106 lb (48.1 kg)   BMI 20.03 kg/m       Assessment & Plan:  1. Essential hypertension Low sodium diet - NIFEdipine (PROCARDIA XL/ADALAT-CC) 90 MG 24 hr tablet; Take 1 tablet (90 mg total) by mouth daily.  Dispense: 90 tablet; Refill: 1 - CMP14+EGFR  2. Gastroesophageal reflux disease without esophagitis Avoid spicy foods Do not eat 2 hours prior to bedtime - omeprazole (PRILOSEC) 20 MG capsule; Take 1 capsule (20 mg total) by mouth daily.  Dispense: 90 capsule; Refill: 1  3. Hypothyroidism, unspecified type - Thyroid Panel With TSH  4. Osteoarthritis of multiple joints, unspecified osteoarthritis type Continue with rheumatologist  5. Hyperlipidemia with target LDL less than 100 Low fat diet - Lipid panel  6. Recurrent major depressive disorder, in full remission ([place]) Stress management - FLUoxetine (PROZAC) 40 MG capsule; TAKE (1) CAPSULE DAILY  Dispense: 90 capsule; Refill: 1 - clonazePAM (KLONOPIN) 0.5 MG tablet; TAKE 1 TABLET TWICE DAILY AS NEEDED FOR ANXIETY  Dispense: 60 tablet; Refill: 2  7. [name] with gangrene ([place]) Keep fingers warm Soak in warm epsom salt daily Watch lesions on fingers  8. SCLERODERMA Continue with rheumatologist    Labs pending Health maintenance reviewed Diet and exercise encouraged Continue all meds Follow up  In 3 months   [place], FNP

## 2017-01-07 LAB — THYROID PANEL WITH TSH
Free Thyroxine Index: 1.9 (ref 1.2–4.9)
T3 UPTAKE RATIO: 22 % — AB (ref 24–39)
T4 TOTAL: 8.7 ug/dL (ref 4.5–12.0)
TSH: 1.33 u[IU]/mL (ref 0.450–4.500)

## 2017-01-07 LAB — CMP14+EGFR
ALBUMIN: 4.2 g/dL (ref 3.5–4.8)
ALT: 11 IU/L (ref 0–32)
AST: 16 IU/L (ref 0–40)
Albumin/Globulin Ratio: 1.8 (ref 1.2–2.2)
Alkaline Phosphatase: 57 IU/L (ref 39–117)
BUN / CREAT RATIO: 25 (ref 12–28)
BUN: 19 mg/dL (ref 8–27)
CHLORIDE: 105 mmol/L (ref 96–106)
CO2: 21 mmol/L (ref 20–29)
Calcium: 9.2 mg/dL (ref 8.7–10.3)
Creatinine, Ser: 0.77 mg/dL (ref 0.57–1.00)
GFR calc non Af Amer: 76 mL/min/{1.73_m2} (ref >59)
GFR, EST AFRICAN AMERICAN: 87 mL/min/{1.73_m2} (ref >59)
GLOBULIN, TOTAL: 2.4 g/dL (ref 1.5–4.5)
GLUCOSE: 99 mg/dL (ref 65–99)
Potassium: 4 mmol/L (ref 3.5–5.2)
SODIUM: 140 mmol/L (ref 134–144)
TOTAL PROTEIN: 6.6 g/dL (ref 6.0–8.5)

## 2017-01-07 LAB — LIPID PANEL
Chol/HDL Ratio: 3.5 ratio (ref 0.0–4.4)
Cholesterol, Total: 177 mg/dL (ref 100–199)
HDL: 50 mg/dL (ref >39)
LDL CALC: 106 mg/dL — AB (ref 0–99)
Triglycerides: 105 mg/dL (ref 0–149)
VLDL CHOLESTEROL CAL: 21 mg/dL (ref 5–40)

## 2017-02-23 ENCOUNTER — Ambulatory Visit (INDEPENDENT_AMBULATORY_CARE_PROVIDER_SITE_OTHER): Payer: Medicare Other | Admitting: Nurse Practitioner

## 2017-02-23 ENCOUNTER — Encounter: Payer: Self-pay | Admitting: Nurse Practitioner

## 2017-02-23 VITALS — BP 137/64 | HR 70 | Temp 100.0°F | Ht 61.0 in | Wt 110.6 lb

## 2017-02-23 DIAGNOSIS — G90511 Complex regional pain syndrome I of right upper limb: Secondary | ICD-10-CM

## 2017-02-23 DIAGNOSIS — M25511 Pain in right shoulder: Secondary | ICD-10-CM

## 2017-02-23 DIAGNOSIS — M542 Cervicalgia: Secondary | ICD-10-CM | POA: Diagnosis not present

## 2017-02-23 MED ORDER — CYCLOBENZAPRINE HCL 10 MG PO TABS: 10.0000 mg | ORAL_TABLET | Freq: Three times a day (TID) | ORAL | 1 refills | Status: DC | PRN

## 2017-02-23 MED ORDER — METHYLPREDNISOLONE ACETATE 80 MG/ML IJ SUSP
80.0000 mg | Freq: Once | INTRAMUSCULAR | Status: AC
  Administered 2017-02-23: 80 mg via INTRAMUSCULAR

## 2017-02-23 NOTE — Patient Instructions (Signed)
Cervical Sprain A cervical sprain is a stretch or tear in the tissues that connect bones (ligaments) in the neck. Most neck (cervical) sprains get better in 4-6 weeks. Follow these instructions at home: If you have a neck collar:  Wear it as told by your doctor. Do not take off (do not remove) the collar unless your doctor says that this is safe.  Ask your doctor before adjusting your collar.  If you have long hair, keep it outside of the collar.  Ask your doctor if you may take off the collar for cleaning and bathing. If you may take off the collar:  Follow instructions from your doctor about how to take off the collar safely.  Clean the collar by wiping it with mild soap and water. Let it air-dry all the way.  If your collar has removable pads:  Take the pads out every 1-2 days.  Hand wash the pads with soap and water.  Let the pads air-dry all the way before you put them back in the collar. Do not dry them in a clothes dryer. Do not dry them with a hair dryer.  Check your skin under the collar for irritation or sores. If you see any, tell your doctor. Managing pain, stiffness, and swelling  Use a cervical traction device, if told by your doctor.  If told, put heat on the affected area. Do this before exercises (physical therapy) or as often as told by your doctor. Use the heat source that your doctor recommends, such as a moist heat pack or a heating pad.  Place a towel between your skin and the heat source.  Leave the heat on for 20-30 minutes.  Take the heat off (remove the heat) if your skin turns bright red. This is very important if you cannot feel pain, heat, or cold. You may have a greater risk of getting burned.  Put ice on the affected area.  Put ice in a plastic bag.  Place a towel between your skin and the bag.  Leave the ice on for 20 minutes, 2-3 times a day. Activity  Do not drive while wearing a neck collar. If you do not have a neck collar, ask your  doctor if it is safe to drive.  Do not drive or use heavy machinery while taking prescription pain medicine or muscle relaxants, unless your doctor approves.  Do not lift anything that is heavier than 10 lb (4.5 kg) until your doctor tells you that it is safe.  Rest as told by your doctor.  Avoid activities that make you feel worse. Ask your doctor what activities are safe for you.  Do exercises as told by your doctor or physical therapist. Preventing neck sprain  Practice good posture. Adjust your workstation to help with this, if needed.  Exercise regularly as told by your doctor or physical therapist.  Avoid activities that are risky or may cause a neck sprain (cervical sprain). General instructions  Take over-the-counter and prescription medicines only as told by your doctor.  Do not use any products that contain nicotine or tobacco. This includes cigarettes and e-cigarettes. If you need help quitting, ask your doctor.  Keep all follow-up visits as told by your doctor. This is important. Contact a doctor if:  You have pain or other symptoms that get worse.  You have symptoms that do not get better after 2 weeks.  You have pain that does not get better with medicine.  You start to have new,   unexplained symptoms.  You have sores or irritated skin from wearing your neck collar. Get help right away if:  You have very bad pain.  You have any of the following in any part of your body:  Loss of feeling (numbness).  Tingling.  Weakness.  You cannot move a part of your body (you have paralysis).  Your activity level does not improve. Summary  A cervical sprain is a stretch or tear in the tissues that connect bones (ligaments) in the neck.  If you have a neck (cervical) collar, do not take off the collar unless your doctor says that this is safe.  Put ice on affected areas as told by your doctor.  Put heat on affected areas as told by your doctor.  Good posture  and regular exercise can help prevent a neck sprain from happening again. This information is not intended to replace advice given to you by your health care provider. Make sure you discuss any questions you have with your health care provider. Document Released: 12/10/2007 Document Revised: 03/04/2016 Document Reviewed: 03/04/2016 Elsevier Interactive Patient Education  2017 [employer]  

## 2017-02-23 NOTE — Progress Notes (Signed)
   Subjective:    Patient ID: Haley Trevino, female    DOB: 1941/06/28, 76 y.o.   MRN: [id number]  HPI Patient comes in today c/o reoccurring right sided neck and shoulder pain. Started again on Friday. denies any injury or over work this time. Pain radiates down to elbow on right sie and patient says that the pain is in the muscle just like before.     Review of Systems  Constitutional: Positive for fever (low grade today).  Respiratory: Negative.   Cardiovascular: Negative.   Gastrointestinal: Negative.   Musculoskeletal: Positive for neck pain.  Neurological: Negative.   Psychiatric/Behavioral: Negative.   All other systems reviewed and are negative.      Objective:   Physical Exam  Constitutional: She is oriented to person, place, and time. She appears well-developed and well-nourished. No distress.  Cardiovascular: Normal rate and regular rhythm.   Pulmonary/Chest: Effort normal and breath sounds normal.  Musculoskeletal:  Decrease ROM of right shoulder with pain on abduction and internal rotation decrease ROM of cervical spine with pain on flexion and rotation to right grips equal bil  Neurological: She is alert and oriented to person, place, and time.  Skin: Skin is warm.  Psychiatric: She has a normal mood and affect. Her behavior is normal. Judgment and thought content normal.   BP 137/64   Pulse 70   Temp 100 F (37.8 C) (Oral)   Ht 5\' 1"  (1.549 m)   Wt 110 lb 9.6 oz (50.2 kg)   BMI 20.90 kg/m       Assessment & Plan:   1. Neck pain   2. Acute pain of right shoulder   3. Complex regional pain syndrome type 1 affecting right upper arm    Meds ordered this encounter  Medications  . methylPREDNISolone acetate (DEPO-MEDROL) injection 80 mg  . cyclobenzaprine (FLEXERIL) 10 MG tablet    Sig: Take 1 tablet (10 mg total) by mouth 3 (three) times daily as needed for muscle spasms.    Dispense:  30 tablet    Refill:  1    Order Specific Question:    Supervising Provider    Answer:   VINCENT, CAROL L [[id number]]   Watch blood pressure- prednisone has increased blood pressure in past Moist heat to area  Rest  RTO prn  Mary-Margaret Hassell Done, FNP

## 2017-02-25 DIAGNOSIS — M810 Age-related osteoporosis without current pathological fracture: Secondary | ICD-10-CM | POA: Diagnosis not present

## 2017-02-25 DIAGNOSIS — M150 Primary generalized (osteo)arthritis: Secondary | ICD-10-CM | POA: Diagnosis not present

## 2017-02-25 DIAGNOSIS — I7300 Raynaud's syndrome without gangrene: Secondary | ICD-10-CM | POA: Diagnosis not present

## 2017-02-25 DIAGNOSIS — M542 Cervicalgia: Secondary | ICD-10-CM | POA: Diagnosis not present

## 2017-02-25 DIAGNOSIS — M340 Progressive systemic sclerosis: Secondary | ICD-10-CM | POA: Diagnosis not present

## 2017-02-25 DIAGNOSIS — L98499 Non-pressure chronic ulcer of skin of other sites with unspecified severity: Secondary | ICD-10-CM | POA: Diagnosis not present

## 2017-02-25 DIAGNOSIS — Z681 Body mass index (BMI) 19 or less, adult: Secondary | ICD-10-CM | POA: Diagnosis not present

## 2017-02-25 DIAGNOSIS — K219 Gastro-esophageal reflux disease without esophagitis: Secondary | ICD-10-CM | POA: Diagnosis not present

## 2017-03-13 ENCOUNTER — Ambulatory Visit (HOSPITAL_COMMUNITY)
Admission: RE | Admit: 2017-03-13 | Discharge: 2017-03-13 | Disposition: A | Discharge status: Home / Self Care | Payer: Medicare Other | Source: Ambulatory Visit | Attending: Nurse Practitioner | Admitting: Nurse Practitioner

## 2017-03-13 ENCOUNTER — Encounter: Payer: Self-pay | Admitting: Nurse Practitioner

## 2017-03-13 ENCOUNTER — Ambulatory Visit (INDEPENDENT_AMBULATORY_CARE_PROVIDER_SITE_OTHER): Payer: Medicare Other | Admitting: Nurse Practitioner

## 2017-03-13 DIAGNOSIS — G4452 New daily persistent headache (NDPH): Secondary | ICD-10-CM | POA: Diagnosis not present

## 2017-03-13 DIAGNOSIS — R51 Headache: Secondary | ICD-10-CM | POA: Diagnosis not present

## 2017-03-13 MED ORDER — KETOROLAC TROMETHAMINE 60 MG/2ML IM SOLN
60.0000 mg | Freq: Once | INTRAMUSCULAR | Status: AC
  Administered 2017-03-13: 60 mg via INTRAMUSCULAR

## 2017-03-13 NOTE — Patient Instructions (Signed)

## 2017-03-13 NOTE — Progress Notes (Signed)
   Subjective:    Patient ID: Haley Trevino, female    DOB: 09/25/1940, 76 y.o.   MRN: [id number]  HPI Patient comes in today c/o headache- SHe says that she never has headaches but this headache started 4 days ago. SHe says the right side of neck hurting and has radiated up to head. SHe saw Dr. Elpidio Galea last week and he gave her some pain pills because her neck was hurting so bad. She took lortab 5/325 at 11am and she has had no relief. Rates headache 8/10 right now.    Review of Systems  Constitutional: Negative for appetite change.  Eyes: Negative for photophobia, pain, discharge and visual disturbance.  Respiratory: Negative.   Cardiovascular: Negative.   Gastrointestinal: Negative for constipation, diarrhea, nausea and vomiting.  Musculoskeletal: Negative.   Neurological: Positive for dizziness and headaches.  Psychiatric/Behavioral: Negative.   All other systems reviewed and are negative.      Objective:   Physical Exam  Constitutional: She is oriented to person, place, and time. She appears well-developed and well-nourished. She appears distressed.  Cardiovascular: Normal rate.   Pulmonary/Chest: Effort normal.  Neurological: She is alert and oriented to person, place, and time.  Skin: Skin is warm.  Psychiatric: She has a normal mood and affect. Her behavior is normal. Judgment and thought content normal.   BP 138/73   Pulse 78   Temp (!) 97 F (36.1 C) (Oral)   Ht 5\' 1"  (1.549 m)   Wt 109 lb (49.4 kg)   BMI 20.60 kg/m       Assessment & Plan:   1. New daily persistent headache    Meds ordered this encounter  Medications  . ketorolac (TORADOL) injection 60 mg   Orders Placed This Encounter  Procedures  . CT Head Wo Contrast    Standing Status:   Future    Standing Expiration Date:   06/12/2018    Order Specific Question:   Preferred imaging location?    Answer:   [facility]    Order Specific Question:   Radiology Contrast Protocol - do NOT  remove file path    Answer:   [email or web address]    Order Specific Question:   Reason for Exam additional comments    Answer:   right sided headache x5 days   Will talk once get head ct report  Mary-Margaret Hassell Done, FNP

## 2017-03-26 ENCOUNTER — Encounter: Payer: Self-pay | Admitting: *Deleted

## 2017-03-26 ENCOUNTER — Ambulatory Visit (INDEPENDENT_AMBULATORY_CARE_PROVIDER_SITE_OTHER): Payer: Medicare Other | Admitting: *Deleted

## 2017-03-26 VITALS — BP 119/78 | HR 82 | Ht 60.5 in | Wt 112.0 lb

## 2017-03-26 DIAGNOSIS — Z23 Encounter for immunization: Secondary | ICD-10-CM

## 2017-03-26 DIAGNOSIS — Z0000 Encounter for general adult medical examination without abnormal findings: Secondary | ICD-10-CM | POA: Diagnosis not present

## 2017-03-26 NOTE — Patient Instructions (Signed)
  [name] , Thank you for taking time to come for your Medicare Wellness Visit. I appreciate your ongoing commitment to your health goals. Please review the following plan we discussed and let me know if I can assist you in the future.   These are the goals we discussed: Goals    . Exercise 3x per week (30 min per time)          Walk daily as tolerated       This is a list of the screening recommended for you and due dates:  Health Maintenance  Topic Date Due  . Flu Shot  02/04/2017  . Tetanus Vaccine  07/09/2017*  . Colon Cancer Screening  06/23/2018  . DEXA scan (bone density measurement)  10/10/2018  . Pneumonia vaccines  Completed  *Topic was postponed. The date shown is not the original due date.

## 2017-03-27 NOTE — Progress Notes (Signed)
Subjective:   Haley Trevino is a 76 y.o. female who presents for an Initial Medicare Annual Wellness Visit. Haley Trevino lives at home with her husband who recently had a stroke. He is able to participate in his own care but becomes frustrated and angry easily. Haley Trevino enjoys reading, crocheting, needlework, and crafting. She has a Cabin crew room. She has one daughter, one grandson, one granddaughter, and one great granddaughter. She really enjoys spending time with her 62 year old great granddaughter. She recently lost a grandson in a motor vehicle accident while in [place] at school. She has some days where it is difficult to handle but overall she feels she is managing well. She also lost a son to suicide 11 years ago and her mother to an attempted suicide attempt at age 26. Her mother lived but was in a nursing facility until her death from complications related to the attempt.   Review of Systems    Reports that her health is a little worse than last year.   Cardiac Risk Factors include: advanced age (>77men, >34 women)   Psych: Some situational anxiety and depression. Overall she feels that she is coping well. She is on medication for anxiety and depression.   Musculoskeletal: chronic right hip pain s/p fracture repair.   Other systems negative today.      Objective:    Today's Vitals   03/26/17 1542  BP: 119/78  Pulse: 82  Weight: 112 lb (50.8 kg)  Height: 5' 0.5" (1.537 m)   Body mass index is 21.51 kg/m.   Current Medications (verified) Outpatient Encounter Prescriptions as of 03/26/2017  Medication Sig  . clonazePAM (KLONOPIN) 0.5 MG tablet TAKE 1 TABLET TWICE DAILY AS NEEDED FOR ANXIETY  . docusate sodium (COLACE) 100 MG capsule Take 100 mg by mouth 2 (two) times daily.  . Ferrous Sulfate Dried (SLOW RELEASE IRON) 45 MG TBCR Take 1 tablet by mouth daily.  Marland Kitchen FLUoxetine (PROZAC) 40 MG capsule TAKE (1) CAPSULE DAILY  . NIFEdipine (PROCARDIA  XL/ADALAT-CC) 90 MG 24 hr tablet Take 1 tablet (90 mg total) by mouth daily.  Marland Kitchen omeprazole (PRILOSEC) 20 MG capsule Take 1 capsule (20 mg total) by mouth daily.  . Probiotic Product ([place]) Take by mouth.  . sildenafil (REVATIO) 20 MG tablet Take 20 mg by mouth 3 (three) times daily.  Marland Kitchen SYNTHROID 75 MCG tablet Take 1 tablet (75 mcg total) by mouth daily before breakfast.  . cyclobenzaprine (FLEXERIL) 10 MG tablet Take 1 tablet (10 mg total) by mouth 3 (three) times daily as needed for muscle spasms. (Patient not taking: Reported on 03/26/2017)  . HYDROcodone-acetaminophen (NORCO/VICODIN) 5-325 MG tablet    No facility-administered encounter medications on file as of 03/26/2017.     Allergies (verified) Codeine; Dilaudid [hydromorphone hcl]; Hydromorphone; Gabapentin; Aleve [naproxen sodium]; Lyrica [pregabalin]; and Methylprednisolone   History: Past Medical History:  Diagnosis Date  . Cataract   . Dyspnea   . Osteoarthritis   . Raynaud disease   . Scleroderma ([place])   . Thyroid disease    hypothyroidism   Past Surgical History:  Procedure Laterality Date  . BREAST ENHANCEMENT SURGERY  1975  . BREAST IMPLANT REMOVAL Bilateral 04/23/2016   Procedure: REMOVALBILATERAL BREAST IMPLANTS;  Surgeon: Wallace Going, DO;  Location: [facility];  Service: Plastics;  Laterality: Bilateral;  . CHOLECYSTECTOMY  1973  . HAND SURGERY  08/2012; 11/2012  . MELANOMA EXCISION     at  6 yrs of age  . ORIF HIP FRACTURE Right 02/22/2014   Procedure: OPEN REDUCTION INTERNAL FIXATION RIGHT HIP;  Surgeon: Sanjuana Kava, MD;  Location: [facility];  Service: Orthopedics;  Laterality: Right;  . TOTAL ABDOMINAL HYSTERECTOMY  1974   Family History  Problem Relation Age of Onset  . Pancreatic cancer Father   . Raynaud syndrome Father   . Cancer Father        pancreatic  . Rashes / Skin problems Daughter        possibly scleraderma  . Hip fracture Mother   . Mental illness  Mother        attempted suicide at 12 yo   Social History   Occupational History  . retired Occupational psychologist at [employer] Topics  . Smoking status: Never Smoker  . Smokeless tobacco: Never Used     Comment: passive tobacco smoke exposure as child  . Alcohol use No  . Drug use: No  . Sexual activity: Yes    Tobacco Counseling No tobacco use  Activities of Daily Living In your present state of health, do you have any difficulty performing the following activities: 03/26/2017 04/23/2016  Hearing? N N  Vision? N N  Difficulty concentrating or making decisions? Y N  Walking or climbing stairs? N N  Dressing or bathing? - N  Conservation officer, nature and eating ? N -  Using the Toilet? N -  In the past six months, have you accidently leaked urine? N -  Do you have problems with loss of bowel control? N -  Managing your Medications? N -  Managing your Finances? N -  Housekeeping or managing your Housekeeping? N -  Some recent data might be hidden    Immunizations and Health Maintenance Immunization History  Administered Date(s) Administered  . Influenza,inj,Quad PF,6+ Mos 04/06/2013, 04/19/2014, 04/19/2015, 03/25/2016  . Pneumococcal Conjugate-13 06/02/2014  . Pneumococcal Polysaccharide-23 06/25/2011  . Tdap 07/07/2005, 03/26/2017  . Zoster 08/06/2009   Health Maintenance Due  Topic Date Due  . INFLUENZA VACCINE  02/04/2017    Patient Care Team: [name], FNP as PCP - General (Nurse Practitioner) Clarene Essex, MD as Consulting Physician (Gastroenterology) Philemon Kingdom, MD as Consulting Physician (Internal Medicine) Hennie Duos, MD as Consulting Physician (Rheumatology) Peggyann Juba, MD as Referring Physician (Orthopedic Surgery) Artis Delay, MD as Referring Physician (Internal Medicine) Justice Britain, MD as Consulting Physician (Orthopedic Surgery) Sanjuana Kava, MD as Consulting Physician (Orthopedic Surgery)  No hospitalizations,  ER visits, or surgeries this past year.      Assessment:   This is a routine wellness examination for Haley Trevino.   Hearing/Vision screen No hearing or vision deficits noted during visit. Last eye exam was 05/2016.  Dietary issues and exercise activities discussed: Current Exercise Habits: The patient does not participate in regular exercise at present, Exercise limited by: orthopedic condition(s)  Diet: Decreased appetite but has never had a big appetite. Usually eats 2 meal a day with snacks. Has a honey bun and coffee for breakfast, yogurt and protein drink for lunch, and meat/vegetables for supper.   Goals    . Exercise 3x per week (30 min per time)          Walk daily as tolerated      Depression Screen PHQ 2/9 Scores 03/26/2017 03/13/2017 02/23/2017 01/06/2017 10/07/2016 08/01/2016 07/28/2016  PHQ - 2 Score 1 0 0 0 4 0 0  PHQ- 9 Score - - - - 13 - -  Fall Risk Fall Risk  03/26/2017 03/13/2017 02/23/2017 01/06/2017 10/07/2016  Falls in the past year? No No No No No  Number falls in past yr: - - - - -  Injury with Fall? - - - - -  Comment - - - - -  Risk Factor Category  - - - - -  Risk for fall due to : - - - - -  Follow up - - - - -  Comment - - - - -    Cognitive Function: MMSE - Mini Mental State Exam 03/26/2017 12/08/2014  Orientation to time 5 5  Orientation to Place 5 5  Registration 3 3  Attention/ Calculation 5 5  Recall 0 3  Language- name 2 objects 2 2  Language- repeat 1 1  Language- follow 3 step command 3 3  Language- read & follow direction 1 1  Write a sentence 1 1  Copy design 0 1  Total score 26 30    Number is concerning but interview was normal. Worth repeating at a follow up visit but may be more related to depression/anxiety than actual memory impairment.     Screening Tests Health Maintenance  Topic Date Due  . INFLUENZA VACCINE  02/04/2017  . COLONOSCOPY  06/23/2018  . DEXA SCAN  10/10/2018  . TETANUS/TDAP  03/27/2027  . PNA vac Low Risk Adult   Completed      Plan:   Tdap given today.  Flu shots available soon.  Take care of yourself to avoid caregiver strain Move carefully to avoid falls Walk daily as your hip tolerates Keep f/u with MMM on 04/02/17   I have personally reviewed and noted the following in the patient's chart:   . Medical and social history . Use of alcohol, tobacco or illicit drugs  . Current medications and supplements . Functional ability and status . Nutritional status . Physical activity . Advanced directives . List of other physicians . Hospitalizations, surgeries, and ER visits in previous 12 months . Vitals . Screenings to include cognitive, depression, and falls . Referrals and appointments  In addition, I have reviewed and discussed with patient certain preventive protocols, quality metrics, and best practice recommendations. A written personalized care plan for preventive services as well as general preventive health recommendations were provided to patient.     Chong Sicilian, RN   03/27/2017   I have reviewed and agree with the above AWV documentation.   Mary-Margaret Hassell Done, FNP

## 2017-04-02 ENCOUNTER — Ambulatory Visit (INDEPENDENT_AMBULATORY_CARE_PROVIDER_SITE_OTHER): Payer: Medicare Other | Admitting: Nurse Practitioner

## 2017-04-02 ENCOUNTER — Encounter: Payer: Self-pay | Admitting: Nurse Practitioner

## 2017-04-02 VITALS — BP 135/67 | HR 69 | Temp 97.4°F | Ht 60.0 in | Wt 112.0 lb

## 2017-04-02 DIAGNOSIS — F3342 Major depressive disorder, recurrent, in full remission: Secondary | ICD-10-CM | POA: Diagnosis not present

## 2017-04-02 DIAGNOSIS — Z23 Encounter for immunization: Secondary | ICD-10-CM

## 2017-04-02 DIAGNOSIS — I7301 Raynaud's syndrome with gangrene: Secondary | ICD-10-CM | POA: Diagnosis not present

## 2017-04-02 DIAGNOSIS — M349 Systemic sclerosis, unspecified: Secondary | ICD-10-CM

## 2017-04-02 DIAGNOSIS — I10 Essential (primary) hypertension: Secondary | ICD-10-CM

## 2017-04-02 DIAGNOSIS — K219 Gastro-esophageal reflux disease without esophagitis: Secondary | ICD-10-CM | POA: Diagnosis not present

## 2017-04-02 DIAGNOSIS — E785 Hyperlipidemia, unspecified: Secondary | ICD-10-CM | POA: Diagnosis not present

## 2017-04-02 DIAGNOSIS — E039 Hypothyroidism, unspecified: Secondary | ICD-10-CM

## 2017-04-02 MED ORDER — SYNTHROID 75 MCG PO TABS: 75.0000 ug | ORAL_TABLET | Freq: Every day | ORAL | 5 refills | Status: DC

## 2017-04-02 MED ORDER — CLONAZEPAM 0.5 MG PO TABS: ORAL_TABLET | ORAL | 2 refills | Status: DC

## 2017-04-02 NOTE — Progress Notes (Signed)
Subjective:    Patient ID: Haley Trevino, female    DOB: 07/02/1941, 76 y.o.   MRN: [id number]  HPI  Haley Trevino is here today for follow up of chronic medical problem.  Outpatient Encounter Prescriptions as of 04/02/2017  Medication Sig  . clonazePAM (KLONOPIN) 0.5 MG tablet TAKE 1 TABLET TWICE DAILY AS NEEDED FOR ANXIETY  . docusate sodium (COLACE) 100 MG capsule Take 100 mg by mouth 2 (two) times daily.  . Ferrous Sulfate Dried (SLOW RELEASE IRON) 45 MG TBCR Take 1 tablet by mouth daily.  Marland Kitchen FLUoxetine (PROZAC) 40 MG capsule TAKE (1) CAPSULE DAILY  . NIFEdipine (PROCARDIA XL/ADALAT-CC) 90 MG 24 hr tablet Take 1 tablet (90 mg total) by mouth daily.  Marland Kitchen omeprazole (PRILOSEC) 20 MG capsule Take 1 capsule (20 mg total) by mouth daily.  . Probiotic Product ([place]) Take by mouth.  . sildenafil (REVATIO) 20 MG tablet Take 20 mg by mouth 3 (three) times daily.  Marland Kitchen SYNTHROID 75 MCG tablet Take 1 tablet (75 mcg total) by mouth daily before breakfast.  . cyclobenzaprine (FLEXERIL) 10 MG tablet Take 1 tablet (10 mg total) by mouth 3 (three) times daily as needed for muscle spasms. (Patient not taking: Reported on 03/26/2017)  . HYDROcodone-acetaminophen (NORCO/VICODIN) 5-325 MG tablet    No facility-administered encounter medications on file as of 04/02/2017.     1. [name] with gangrene ([place])  Patients fingers stay cold all th etime. SHe keeps hand wramers n hands constantly. Gets sores often.  2. Essential hypertension  No c/o chest pain, sob or headache  3. Gastroesophageal reflux disease without esophagitis  Uses omperazole daily- works well to keep symptoms undr cotrol  4. Hypothyroidism, unspecified type  Not having any issues that she is aware of.  5. SCLERODERMA  Is follow ed bu Dr. Elpidio Galea- sees him every 3-6 months  6. Hyperlipidemia with target LDL less than 100  Has a very poor appetite  7. Recurrent major depressive disorder, in full  remission ([place])  Has been on prozac for awhile- is doing well Depression screen [facility] 2/9 04/02/2017 03/26/2017 03/13/2017 02/23/2017 01/06/2017  Decreased Interest 0 0 0 0 0  Down, Depressed, Hopeless 0 1 0 0 0  PHQ - 2 Score 0 1 0 0 0  Altered sleeping - - - - -  Tired, decreased energy - - - - -  Change in appetite - - - - -  Feeling bad or failure about yourself  - - - - -  Trouble concentrating - - - - -  Moving slowly or fidgety/restless - - - - -  Suicidal thoughts - - - - -  PHQ-9 Score - - - - -  Difficult doing work/chores - - - - -       New complaints: None today  Social history: Recently had old breast implants removed- they wer hard and painful- doing well.    Review of Systems  Constitutional: Negative for activity change and appetite change.  HENT: Negative.   Eyes: Negative for pain.  Respiratory: Negative for shortness of breath.   Cardiovascular: Negative for chest pain, palpitations and leg swelling.  Gastrointestinal: Negative for abdominal pain.  Endocrine: Negative for polydipsia.  Genitourinary: Negative.   Musculoskeletal: Positive for arthralgias and myalgias.  Skin: Negative for rash.  Neurological: Negative for dizziness, weakness and headaches.  Hematological: Does not bruise/bleed easily.  Psychiatric/Behavioral: Negative.   All other systems reviewed and are negative.  Objective:   Physical Exam  Constitutional: She is oriented to person, place, and time. She appears well-developed and well-nourished.  HENT:  Nose: Nose normal.  Mouth/Throat: Oropharynx is clear and moist.  Eyes: EOM are normal.  Neck: Trachea normal, normal range of motion and full passive range of motion without pain. Neck supple. No JVD present. Carotid bruit is not present. No thyromegaly present.  Cardiovascular: Normal rate, regular rhythm, normal heart sounds and intact distal pulses.  Exam reveals no gallop and no friction rub.   No murmur  heard. Pulmonary/Chest: Effort normal and breath sounds normal.  Abdominal: Soft. Bowel sounds are normal. She exhibits no distension and no mass. There is no tenderness.  Musculoskeletal: Normal range of motion.  Lymphadenopathy:    She has no cervical adenopathy.  Neurological: She is alert and oriented to person, place, and time. She has normal reflexes.  Skin: Skin is warm and dry.  Tips of all fingers cold to touch and pale- no open lesions or sores  Psychiatric: She has a normal mood and affect. Her behavior is normal. Judgment and thought content normal.    BP 135/67   Pulse 69   Temp (!) 97.4 F (36.3 C) (Oral)   Ht 5' (1.524 m)   Wt 112 lb (50.8 kg)   BMI 21.87 kg/m       Assessment & Plan:  1. [name] with gangrene ([place]) Keep hands warm  2. Essential hypertension Low sodium diet - CMP14+EGFR  3. Gastroesophageal reflux disease without esophagitis Avoid spicy foods Do not eat 2 hours prior to bedtime  4. Hypothyroidism, unspecified type - SYNTHROID 75 MCG tablet; Take 1 tablet (75 mcg total) by mouth daily before breakfast.  Dispense: 30 tablet; Refill: 5 - Thyroid Panel With TSH  5. SCLERODERMA Keep follow up with   6. Hyperlipidemia with target LDL less than 100 Low fat diet - Lipid panel  7. Recurrent major depressive disorder, in full remission ([place]) stres management - clonazePAM (KLONOPIN) 0.5 MG tablet; TAKE 1 TABLET TWICE DAILY AS NEEDED FOR ANXIETY  Dispense: 60 tablet; Refill: 2   Flu shot today Labs pending Health maintenance reviewed Diet and exercise encouraged Continue all meds Follow up  In 6 months   [place], FNP

## 2017-04-03 LAB — CMP14+EGFR
ALBUMIN: 4.2 g/dL (ref 3.5–4.8)
ALT: 9 IU/L (ref 0–32)
AST: 16 IU/L (ref 0–40)
Albumin/Globulin Ratio: 1.5 (ref 1.2–2.2)
Alkaline Phosphatase: 70 IU/L (ref 39–117)
BUN / CREAT RATIO: 11 — AB (ref 12–28)
BUN: 9 mg/dL (ref 8–27)
Bilirubin Total: <0.2 mg/dL (ref 0.0–1.2)
CO2: 25 mmol/L (ref 20–29)
CREATININE: 0.84 mg/dL (ref 0.57–1.00)
Calcium: 9.1 mg/dL (ref 8.7–10.3)
Chloride: 102 mmol/L (ref 96–106)
GFR calc non Af Amer: 68 mL/min/{1.73_m2} (ref >59)
GFR, EST AFRICAN AMERICAN: 79 mL/min/{1.73_m2} (ref >59)
GLOBULIN, TOTAL: 2.8 g/dL (ref 1.5–4.5)
GLUCOSE: 83 mg/dL (ref 65–99)
Potassium: 4.5 mmol/L (ref 3.5–5.2)
Sodium: 140 mmol/L (ref 134–144)
TOTAL PROTEIN: 7 g/dL (ref 6.0–8.5)

## 2017-04-03 LAB — LIPID PANEL
CHOL/HDL RATIO: 3.9 ratio (ref 0.0–4.4)
Cholesterol, Total: 202 mg/dL — ABNORMAL HIGH (ref 100–199)
HDL: 52 mg/dL (ref >39)
LDL CALC: 128 mg/dL — AB (ref 0–99)
Triglycerides: 112 mg/dL (ref 0–149)
VLDL CHOLESTEROL CAL: 22 mg/dL (ref 5–40)

## 2017-04-03 LAB — THYROID PANEL WITH TSH
Free Thyroxine Index: 2.1 (ref 1.2–4.9)
T3 Uptake Ratio: 24 % (ref 24–39)
T4 TOTAL: 8.8 ug/dL (ref 4.5–12.0)
TSH: 1.1 u[IU]/mL (ref 0.450–4.500)

## 2017-04-06 ENCOUNTER — Telehealth: Payer: Self-pay | Admitting: Nurse Practitioner

## 2017-04-06 NOTE — Telephone Encounter (Signed)
Patient aware of results.

## 2017-04-29 ENCOUNTER — Ambulatory Visit (INDEPENDENT_AMBULATORY_CARE_PROVIDER_SITE_OTHER): Payer: Medicare Other | Admitting: Family Medicine

## 2017-04-29 ENCOUNTER — Encounter: Payer: Self-pay | Admitting: Family Medicine

## 2017-04-29 ENCOUNTER — Other Ambulatory Visit: Payer: Self-pay | Admitting: Family Medicine

## 2017-04-29 ENCOUNTER — Ambulatory Visit (INDEPENDENT_AMBULATORY_CARE_PROVIDER_SITE_OTHER): Payer: Medicare Other

## 2017-04-29 VITALS — BP 119/64 | HR 84 | Temp 99.0°F

## 2017-04-29 DIAGNOSIS — R42 Dizziness and giddiness: Secondary | ICD-10-CM | POA: Diagnosis not present

## 2017-04-29 DIAGNOSIS — M25552 Pain in left hip: Secondary | ICD-10-CM | POA: Diagnosis not present

## 2017-04-29 DIAGNOSIS — M25551 Pain in right hip: Secondary | ICD-10-CM

## 2017-04-29 DIAGNOSIS — W19XXXA Unspecified fall, initial encounter: Secondary | ICD-10-CM

## 2017-04-29 DIAGNOSIS — M545 Low back pain: Secondary | ICD-10-CM

## 2017-04-29 LAB — GLUCOSE HEMOCUE WAIVED: Glu Hemocue Waived: 121 mg/dL — ABNORMAL HIGH (ref 65–99)

## 2017-04-29 MED ORDER — TIZANIDINE HCL 4 MG PO TABS: 4.0000 mg | ORAL_TABLET | Freq: Four times a day (QID) | ORAL | 0 refills | Status: DC | PRN

## 2017-04-29 MED ORDER — HYDROCODONE-ACETAMINOPHEN 5-325 MG PO TABS: 1.0000 | ORAL_TABLET | Freq: Four times a day (QID) | ORAL | 0 refills | Status: DC | PRN

## 2017-04-29 NOTE — Patient Instructions (Addendum)
I will contact you will the results of your labs.  If anything is abnormal, I will call you.    I have given you a small prescription for hydrocodone for pain.  Use this only if needed and only as directed.  I have replaced your Cyclobenzaprine with Tizanidine.  You may take this 3 times daily as needed for pain/ spasm.  Fall Prevention in the Home Falls can cause injuries and can affect people from all age groups. There are many simple things that you can do to make your home safe and to help prevent falls. What can I do on the outside of my home?  Regularly repair the edges of walkways and driveways and fix any cracks.  Remove high doorway thresholds.  Trim any shrubbery on the main path into your home.  Use bright outdoor lighting.  Clear walkways of debris and clutter, including tools and rocks.  Regularly check that handrails are securely fastened and in good repair. Both sides of any steps should have handrails.  Install guardrails along the edges of any raised decks or porches.  Have leaves, snow, and ice cleared regularly.  Use sand or salt on walkways during winter months.  In the garage, clean up any spills right away, including grease or oil spills. What can I do in the bathroom?  Use night lights.  Install grab bars by the toilet and in the tub and shower. Do not use towel bars as grab bars.  Use non-skid mats or decals on the floor of the tub or shower.  If you need to sit down while you are in the shower, use a plastic, non-slip stool.  Keep the floor dry. Immediately clean up any water that spills on the floor.  Remove soap buildup in the tub or shower on a regular basis.  Attach bath mats securely with double-sided non-slip rug tape.  Remove throw rugs and other tripping hazards from the floor. What can I do in the bedroom?  Use night lights.  Make sure that a bedside light is easy to reach.  Do not use oversized bedding that drapes onto the  floor.  Have a firm chair that has side arms to use for getting dressed.  Remove throw rugs and other tripping hazards from the floor. What can I do in the kitchen?  Clean up any spills right away.  Avoid walking on wet floors.  Place frequently used items in easy-to-reach places.  If you need to reach for something above you, use a sturdy step stool that has a grab bar.  Keep electrical cables out of the way.  Do not use floor polish or wax that makes floors slippery. If you have to use wax, make sure that it is non-skid floor wax.  Remove throw rugs and other tripping hazards from the floor. What can I do in the stairways?  Do not leave any items on the stairs.  Make sure that there are handrails on both sides of the stairs. Fix handrails that are broken or loose. Make sure that handrails are as long as the stairways.  Check any carpeting to make sure that it is firmly attached to the stairs. Fix any carpet that is loose or worn.  Avoid having throw rugs at the top or bottom of stairways, or secure the rugs with carpet tape to prevent them from moving.  Make sure that you have a light switch at the top of the stairs and the bottom of the stairs.  If you do not have them, have them installed. What are some other fall prevention tips?  Wear closed-toe shoes that fit well and support your feet. Wear shoes that have rubber soles or low heels.  When you use a stepladder, make sure that it is completely opened and that the sides are firmly locked. Have someone hold the ladder while you are using it. Do not climb a closed stepladder.  Add color or contrast paint or tape to grab bars and handrails in your home. Place contrasting color strips on the first and last steps.  Use mobility aids as needed, such as canes, walkers, scooters, and crutches.  Turn on lights if it is dark. Replace any light bulbs that burn out.  Set up furniture so that there are clear paths. Keep the  furniture in the same spot.  Fix any uneven floor surfaces.  Choose a carpet design that does not hide the edge of steps of a stairway.  Be aware of any and all pets.  Review your medicines with your healthcare provider. Some medicines can cause dizziness or changes in blood pressure, which increase your risk of falling. Talk with your health care provider about other ways that you can decrease your risk of falls. This may include working with a physical therapist or trainer to improve your strength, balance, and endurance. This information is not intended to replace advice given to you by your health care provider. Make sure you discuss any questions you have with your health care provider. Document Released: 06/13/2002 Document Revised: 11/20/2015 Document Reviewed: 07/28/2014 Elsevier Interactive Patient Education  2017 [employer].

## 2017-04-29 NOTE — Progress Notes (Signed)
Subjective: Haley Trevino PCP: Haley Pretty, FNP Haley Trevino Haley Trevino is a 76 y.o. female, she is accompanied by her husband to today's appt. She is presenting to clinic today for:  1. Fall Patient sustained a fall on Monday afternoon while walking onto her cement deck.  Her husband notes that she tripped over the barrier.  She fell on her left side on Monday evening.  She reports left-sided buttock pain, hip pain and groin pain.  She notes that she did not have dizziness preceding this.  There was no loss of consciousness.  She did not hit her head.  She does have a past medical history of hip fracture after a fall on the right which required surgical repair.  Patient has been taking Flexeril and Norco for pain.  Patient notes slight increase in dizziness since taking medications.  Patient notes weakness secondary to pain.  She has been using a walker/cane for ambulation.  She notes pain when she applies weight on that left lower extremity.  She denies numbness, tingling, swelling.  She does report some bruising.  She has a known history of osteoporosis.  Allergies  Allergen Reactions  . Codeine Shortness Of Breath    REACTION: dyspnea  . Dilaudid [Hydromorphone Hcl] Anaphylaxis  . Hydromorphone Anaphylaxis  . Gabapentin Swelling    Swelling in feet, and in legs  . Aleve [Naproxen Sodium] Rash  . Lyrica [Pregabalin] Rash  . Methylprednisolone Palpitations    Increased BP   Past Medical History:  Diagnosis Date  . Cataract   . Dyspnea   . Osteoarthritis   . Raynaud disease   . Scleroderma ([place])   . Thyroid disease    hypothyroidism   Family History  Problem Relation Age of Onset  . Pancreatic cancer Father   . Raynaud syndrome Father   . Cancer Father        pancreatic  . Rashes / Skin problems Daughter        possibly scleraderma  . Hip fracture Mother   . Mental illness Mother        attempted suicide at 33 yo    Current Outpatient Prescriptions:  .   clonazePAM (KLONOPIN) 0.5 MG tablet, TAKE 1 TABLET TWICE DAILY AS NEEDED FOR ANXIETY, Disp: 60 tablet, Rfl: 2 .  docusate sodium (COLACE) 100 MG capsule, Take 100 mg by mouth 2 (two) times daily., Disp: , Rfl:  .  Ferrous Sulfate Dried (SLOW RELEASE IRON) 45 MG TBCR, Take 1 tablet by mouth daily., Disp: , Rfl:  .  FLUoxetine (PROZAC) 40 MG capsule, TAKE (1) CAPSULE DAILY, Disp: 90 capsule, Rfl: 1 .  HYDROcodone-acetaminophen (NORCO/VICODIN) 5-325 MG tablet, Take 1 tablet by mouth every 6 (six) hours as needed for moderate pain., Disp: 12 tablet, Rfl: 0 .  NIFEdipine (PROCARDIA XL/ADALAT-CC) 90 MG 24 hr tablet, Take 1 tablet (90 mg total) by mouth daily., Disp: 90 tablet, Rfl: 1 .  omeprazole (PRILOSEC) 20 MG capsule, Take 1 capsule (20 mg total) by mouth daily., Disp: 90 capsule, Rfl: 1 .  Probiotic Product ([place]), Take by mouth., Disp: , Rfl:  .  sildenafil (REVATIO) 20 MG tablet, Take 20 mg by mouth 3 (three) times daily., Disp: , Rfl:  .  SYNTHROID 75 MCG tablet, Take 1 tablet (75 mcg total) by mouth daily before breakfast., Disp: 30 tablet, Rfl: 5 .  tiZANidine (ZANAFLEX) 4 MG tablet, Take 1 tablet (4 mg total) by mouth every 6 (six) hours as needed for  muscle spasms., Disp: 30 tablet, Rfl: 0  Social Hx: never smoker.  Health Maintenance: Flu shot  ROS: Per HPI  Objective: Office vital signs reviewed. BP 119/64   Pulse 84   Temp 99 F (37.2 Haley)   SpO2 96%   Physical Examination:  General: Awake, alert, thin female, arrives in wheel chair, No acute distress Cardio: regular rate, +2 DP Pulm: normal air entry, normal work of breathing on room air Extremities: hands are pale (pt has h/o raynaud's)  Left hip: Active range of motion reduced in all planes secondary to pain.  She has 3/5 strength in hip flexion, abduction and adduction.  She has pain with both internal and external rotation of the hip.  She has point tenderness along the lateral aspect and posterior  aspect of the left hip.  There are no palpable bony abnormalities.  Spine: No midline tenderness or paraspinal tenderness to palpation of the lumbar spine.  No tenderness to palpation of the sacrum.  There are no palpable bony abnormalities.  A scoliotic curve to the left is noted at the lumbar spine. MSK: gait not assessed, patient arrives in wheelchair Skin: dry; intact; no rashes or lesions Neuro: Light touch sensation grossly intact. Follows commands, AOx3, no focal neurologic deficits  Orthostatic VS for the past 24 hrs:  BP- Lying Pulse- Lying BP- Sitting Pulse- Sitting BP- Standing at 0 minutes Pulse- Standing at 0 minutes  04/29/17 1525 131/61 80 119/70 84 123/73 84  04/29/17 1520 132/61 80 119/64 84 123/73 84   Dg Lumbar Spine 2-3 Views  Result Date: 04/29/2017 CLINICAL DATA:  Fall with pain in the groin and hips EXAM: LUMBAR SPINE - 2-3 VIEW COMPARISON:  03/04/2010 FINDINGS: Mild to moderate dextroscoliosis of the lumbar spine. Moderate-to-marked degenerative changes at L4-L5 and L5-S1 with mild to moderate changes at L2-L3. Findings have progressed at L2-L3. Mild superior endplate deformity at L2 age indeterminate. IMPRESSION: 1. Scoliosis of the spine with progression of degenerative changes at L2-L3. 2. Minimal superior endplate pain cavity/depression, uncertain chronicity. Electronically Signed   By: Donavan Foil M.D.   On: 04/29/2017 16:11   Dg Hips Bilat With Pelvis 2v  Result Date: 04/29/2017 CLINICAL DATA:  Fall with bilateral hip pain EXAM: DG HIP (WITH OR WITHOUT PELVIS) 2V BILAT COMPARISON:  03/04/2010, 02/21/2014 FINDINGS: Pubic symphysis and rami are intact. Status post intramedullary rod and screw fixation of the proximal right femur are across old trochanter fracture deformity. No acute displaced fracture or dislocation is evident. IMPRESSION: 1. No acute osseous abnormality 2. Postsurgical changes of the right femur Electronically Signed   By: Donavan Foil M.D.   On:  04/29/2017 16:06    Assessment/ Plan: 76 y.o. female   1. Fall, initial encounter Appears to be mechanical per her report.  She does have some dizziness on today's exam and therefore metabolic labs have been ordered.  See below.  Personal review of imaging studies did not reveal acute fractures within the pelvis or hips.  This was reported by the radiologist review.  The lumbar films did show a scoliotic curve.  Degenerative changes were appreciated and osteophytes were seen.  She does appear to have some compression of several of the vertebra.  Radiologist review notes that this was L2 through L3.  She has known osteoporosis.  These films were reviewed with the patient and her husband.  I did recommend that she continue to use the walker for support while her pain is still present.  She  may continue using her home pain medications.  I have replaced her Flexeril with tizanidine.  Strict return precautions and reasons for emergent evaluation in the emergency department were reviewed with patient.  She was good understanding will follow-up as needed. - DG Lumbar Spine 2-3 Views; Future - Glucose Hemocue Waived - CBC - CMP14+EGFR  2. Pain of both hip joints - DG Lumbar Spine 2-3 Views; Future  3. Acute bilateral low back pain, with sciatica presence unspecified - DG Lumbar Spine 2-3 Views; Future  4. Dizziness I suspect that this is secondary to polypharmacy.  She is on several sedating/dizziness provoking medications, including Norco, clonazepam, Flexeril. I have replaced her Flexeril with tizanidine given her age.  She will hopefully have a less side effects with this medication.   Orthostatic vital signs were unremarkable.  She had normal oxygen saturation on room air.  Her blood glucose level was not low.  CBC and CMP were obtained to evaluate electrolyte status, renal function and rule out anemia as possible cause.  Will contact patient with these lab results. - CBC - CMP14+EGFR   Orders  Placed This Encounter  Procedures  . DG Lumbar Spine 2-3 Views    Standing Status:   Future    Number of Occurrences:   1    Standing Expiration Date:   06/29/2018    Order Specific Question:   Reason for Exam (SYMPTOM  OR DIAGNOSIS REQUIRED)    Answer:   fall w/ pain in hips and groin    Order Specific Question:   Preferred imaging location?    Answer:   Internal  . Glucose Hemocue Waived  . CBC  . CMP14+EGFR   Meds ordered this encounter  Medications  . tiZANidine (ZANAFLEX) 4 MG tablet    Sig: Take 1 tablet (4 mg total) by mouth every 6 (six) hours as needed for muscle spasms.    Dispense:  30 tablet    Refill:  0  . HYDROcodone-acetaminophen (NORCO/VICODIN) 5-325 MG tablet    Sig: Take 1 tablet by mouth every 6 (six) hours as needed for moderate pain.    Dispense:  12 tablet    Refill:  [place], DO [place] [phone]

## 2017-04-30 LAB — CBC
Hematocrit: 31.2 % — ABNORMAL LOW (ref 34.0–46.6)
Hemoglobin: 11.4 g/dL (ref 11.1–15.9)
MCH: 33 pg (ref 26.6–33.0)
MCHC: 36.5 g/dL — ABNORMAL HIGH (ref 31.5–35.7)
MCV: 90 fL (ref 79–97)
PLATELETS: 343 10*3/uL (ref 150–379)
RBC: 3.45 x10E6/uL — AB (ref 3.77–5.28)
RDW: 14.8 % (ref 12.3–15.4)
WBC: 9.1 10*3/uL (ref 3.4–10.8)

## 2017-04-30 LAB — CMP14+EGFR
ALT: 34 IU/L — AB (ref 0–32)
AST: 25 IU/L (ref 0–40)
Albumin/Globulin Ratio: 1.3 (ref 1.2–2.2)
Albumin: 4 g/dL (ref 3.5–4.8)
Alkaline Phosphatase: 78 IU/L (ref 39–117)
BUN/Creatinine Ratio: 12 (ref 12–28)
BUN: 9 mg/dL (ref 8–27)
CALCIUM: 8.9 mg/dL (ref 8.7–10.3)
CHLORIDE: 101 mmol/L (ref 96–106)
CO2: 22 mmol/L (ref 20–29)
Creatinine, Ser: 0.74 mg/dL (ref 0.57–1.00)
GFR, EST AFRICAN AMERICAN: 92 mL/min/{1.73_m2} (ref >59)
GFR, EST NON AFRICAN AMERICAN: 80 mL/min/{1.73_m2} (ref >59)
GLUCOSE: 105 mg/dL — AB (ref 65–99)
Globulin, Total: 3 g/dL (ref 1.5–4.5)
Potassium: 4.1 mmol/L (ref 3.5–5.2)
Sodium: 138 mmol/L (ref 134–144)
TOTAL PROTEIN: 7 g/dL (ref 6.0–8.5)

## 2017-05-08 ENCOUNTER — Ambulatory Visit (INDEPENDENT_AMBULATORY_CARE_PROVIDER_SITE_OTHER): Payer: Medicare Other | Admitting: Nurse Practitioner

## 2017-05-08 ENCOUNTER — Encounter: Payer: Self-pay | Admitting: Nurse Practitioner

## 2017-05-08 VITALS — BP 108/51 | HR 70 | Temp 99.3°F

## 2017-05-08 DIAGNOSIS — M25552 Pain in left hip: Secondary | ICD-10-CM | POA: Diagnosis not present

## 2017-05-08 MED ORDER — METHYLPREDNISOLONE ACETATE 80 MG/ML IJ SUSP
80.0000 mg | Freq: Once | INTRAMUSCULAR | Status: AC
  Administered 2017-05-08: 80 mg via INTRAMUSCULAR

## 2017-05-08 NOTE — Progress Notes (Signed)
   Subjective:    Patient ID: Haley Trevino, female    DOB: February 13, 1941, 76 y.o.   MRN: [id number]  HPI Patient comes in today c/o left hip pain. Patient said she was walking out onto her porch and she fell on concrete porch and hit her left hip. Pain with walking. Lots of truble walking up and down steps. She is weak on right leg from previous fracture and surgery. SHe came in the other day and saw A. Lajuana Ripple and she took Production manager. xrays were negative for any fracture.    Review of Systems  Constitutional: Negative.   Respiratory: Negative.   Cardiovascular: Negative.   Neurological: Negative.   Psychiatric/Behavioral: Negative.   All other systems reviewed and are negative.      Objective:   Physical Exam  Constitutional: She is oriented to person, place, and time. She appears well-developed and well-nourished. No distress.  Cardiovascular: Normal rate and regular rhythm.   Pulmonary/Chest: Effort normal and breath sounds normal.  Musculoskeletal:  FROM of left hip with pain on flexion and extension No point tenderness Walking with cane.  Neurological: She is alert and oriented to person, place, and time.  Skin: Skin is warm.  Psychiatric: She has a normal mood and affect. Her behavior is normal. Judgment and thought content normal.   BP (!) 108/51   Pulse 70   Temp 99.3 F (37.4 C) (Oral)        Assessment & Plan:   1. Left hip pain    Meds ordered this encounter  Medications  . methylPREDNISolone acetate (DEPO-MEDROL) injection 80 mg   Moist heat to hip Rest If does not improve will do ortho referral  [place], FNP

## 2017-05-08 NOTE — Patient Instructions (Signed)
Bursitis Bursitis is when the fluid-filled sac (bursa) that covers and protects a joint is swollen (inflamed). Bursitis is most common near joints, especially the knees, elbows, hips, and shoulders. Follow these instructions at home:  Take medicines only as told by your doctor.  If you were prescribed an antibiotic medicine, finish it all even if you start to feel better.  Rest the affected area as told by your doctor. ? Keep the area raised up. ? Avoid doing things that make the pain worse.  Apply ice to the injured area: ? Place ice in a plastic bag. ? Place a towel between your skin and the bag. ? Leave the ice on for 20 minutes, 2-3 times a day.  Use splints, braces, pads, or walking aids as told by your doctor.  Keep all follow-up visits as told by your doctor. This is important. Contact a doctor if:  You have more pain with home care.  You have a fever.  You have chills. This information is not intended to replace advice given to you by your health care provider. Make sure you discuss any questions you have with your health care provider. Document Released: 12/11/2009 Document Revised: 11/29/2015 Document Reviewed: 09/12/2013 Elsevier Interactive Patient Education  2018 Elsevier Inc.  

## 2017-06-02 DIAGNOSIS — H2511 Age-related nuclear cataract, right eye: Secondary | ICD-10-CM | POA: Diagnosis not present

## 2017-06-02 DIAGNOSIS — H25812 Combined forms of age-related cataract, left eye: Secondary | ICD-10-CM | POA: Diagnosis not present

## 2017-06-02 DIAGNOSIS — H43813 Vitreous degeneration, bilateral: Secondary | ICD-10-CM | POA: Diagnosis not present

## 2017-06-02 DIAGNOSIS — H02889 Meibomian gland dysfunction of unspecified eye, unspecified eyelid: Secondary | ICD-10-CM | POA: Diagnosis not present

## 2017-06-10 ENCOUNTER — Ambulatory Visit (INDEPENDENT_AMBULATORY_CARE_PROVIDER_SITE_OTHER): Payer: Medicare Other | Admitting: Nurse Practitioner

## 2017-06-10 ENCOUNTER — Encounter: Payer: Self-pay | Admitting: Nurse Practitioner

## 2017-06-10 VITALS — BP 128/54 | HR 83 | Temp 98.4°F

## 2017-06-10 DIAGNOSIS — M5431 Sciatica, right side: Secondary | ICD-10-CM | POA: Diagnosis not present

## 2017-06-10 MED ORDER — PREDNISONE 20 MG PO TABS: ORAL_TABLET | ORAL | 0 refills | Status: DC

## 2017-06-10 MED ORDER — METHYLPREDNISOLONE ACETATE 80 MG/ML IJ SUSP
80.0000 mg | Freq: Once | INTRAMUSCULAR | Status: AC
  Administered 2017-06-10: 80 mg via INTRAMUSCULAR

## 2017-06-10 MED ORDER — CYCLOBENZAPRINE HCL 10 MG PO TABS: 10.0000 mg | ORAL_TABLET | Freq: Three times a day (TID) | ORAL | 1 refills | Status: DC | PRN

## 2017-06-10 NOTE — Addendum Note (Signed)
Addended by: [name] on: 06/10/2017 01:00 PM   Modules accepted: Orders

## 2017-06-10 NOTE — Progress Notes (Addendum)
   Subjective:    Patient ID: Haley Trevino, female    DOB: 1940/08/20, 76 y.o.   MRN: [id number]  HPI  Patient come sin today c/o right hip pain. Pain runs all the way down into Haley Trevino toes. Pain is worse wen she is sitting. Rates pain 10/10. Nothing has helped it.   Review of Systems  Constitutional: Negative.   HENT: Negative.   Respiratory: Negative.   Cardiovascular: Negative.   Musculoskeletal: Positive for back pain.  Neurological: Negative.   Psychiatric/Behavioral: Negative.   All other systems reviewed and are negative.      Objective:   Physical Exam  Constitutional: She is oriented to person, place, and time. She appears well-developed and well-nourished. No distress.  Cardiovascular: Normal rate and regular rhythm.  Pulmonary/Chest: Effort normal and breath sounds normal.  Musculoskeletal:  Pain post thigh with SLR Cannot sit and walk Sitting in wheel chair and pulling leg all the way up to relieve pain   Neurological: She is oriented to person, place, and time.  Skin: Skin is warm.  Psychiatric: She has a normal mood and affect. Haley Trevino behavior is normal. Judgment and thought content normal.   BP (!) 128/54   Pulse 83   Temp 98.4 F (36.9 C) (Oral)       Assessment & Plan:  1. Sciatica of right side Moist heat Stretches RTO prn - methylPREDNISolone acetate (DEPO-MEDROL) injection 80 mg - predniSONE (DELTASONE) 20 MG tablet; 2 po at sametime daily for 5 days- start tomorrow  Dispense: 10 tablet; Refill: 0  Mary-Margaret Hassell Done, FNP

## 2017-06-10 NOTE — Patient Instructions (Signed)
Sciatica Sciatica is pain, numbness, weakness, or tingling along your sciatic nerve. The sciatic nerve starts in the lower back and goes down the back of each leg. Sciatica happens when this nerve is pinched or has pressure put on it. Sciatica usually goes away on its own or with treatment. Sometimes, sciatica may keep coming back (recur). Follow these instructions at home: Medicines  Take over-the-counter and prescription medicines only as told by your doctor.  Do not drive or use heavy machinery while taking prescription pain medicine. Managing pain  If directed, put ice on the affected area. ? Put ice in a plastic bag. ? Place a towel between your skin and the bag. ? Leave the ice on for 20 minutes, 2-3 times a day.  After icing, apply heat to the affected area before you exercise or as often as told by your doctor. Use the heat source that your doctor tells you to use, such as a moist heat pack or a heating pad. ? Place a towel between your skin and the heat source. ? Leave the heat on for 20-30 minutes. ? Remove the heat if your skin turns bright red. This is especially important if you are unable to feel pain, heat, or cold. You may have a greater risk of getting burned. Activity  Return to your normal activities as told by your doctor. Ask your doctor what activities are safe for you. ? Avoid activities that make your sciatica worse.  Take short rests during the day. Rest in a lying or standing position. This is usually better than sitting to rest. ? When you rest for a long time, do some physical activity or stretching between periods of rest. ? Avoid sitting for a long time without moving. Get up and move around at least one time each hour.  Exercise and stretch regularly, as told by your doctor.  Do not lift anything that is heavier than 10 lb (4.5 kg) while you have symptoms of sciatica. ? Avoid lifting heavy things even when you do not have symptoms. ? Avoid lifting heavy  things over and over.  When you lift objects, always lift in a way that is safe for your body. To do this, you should: ? Bend your knees. ? Keep the object close to your body. ? Avoid twisting. General instructions  Use good posture. ? Avoid leaning forward when you are sitting. ? Avoid hunching over when you are standing.  Stay at a healthy weight.  Wear comfortable shoes that support your feet. Avoid wearing high heels.  Avoid sleeping on a mattress that is too soft or too hard. You might have less pain if you sleep on a mattress that is firm enough to support your back.  Keep all follow-up visits as told by your doctor. This is important. Contact a doctor if:  You have pain that: ? Wakes you up when you are sleeping. ? Gets worse when you lie down. ? Is worse than the pain you have had in the past. ? Lasts longer than 4 weeks.  You lose weight for without trying. Get help right away if:  You cannot control when you pee (urinate) or poop (have a bowel movement).  You have weakness in any of these areas and it gets worse. ? Lower back. ? Lower belly (pelvis). ? Butt (buttocks). ? Legs.  You have redness or swelling of your back.  You have a burning feeling when you pee. This information is not intended to replace   advice given to you by your health care provider. Make sure you discuss any questions you have with your health care provider. Document Released: 04/01/2008 Document Revised: 11/29/2015 Document Reviewed: 03/02/2015 Elsevier Interactive Patient Education  2018 Elsevier Inc.  

## 2017-06-19 ENCOUNTER — Telehealth: Payer: Self-pay | Admitting: Nurse Practitioner

## 2017-06-19 DIAGNOSIS — M5440 Lumbago with sciatica, unspecified side: Secondary | ICD-10-CM

## 2017-06-19 NOTE — Telephone Encounter (Signed)
Patient was seen on 06/10/17 and prescribed 5 days of Prednisone 20 mg and given Depo-Medrol injection.  She is not any better and would like to know if something else can be sent in.  Please advise.

## 2017-06-19 NOTE — Telephone Encounter (Signed)
Pt c/o right sciatica nerve pain. States it is no better. Pt states she can not come in wants a stronger steroid called in that can help her. [employer].

## 2017-06-19 NOTE — Telephone Encounter (Signed)
Needs to talk with orthopedist

## 2017-06-19 NOTE — Telephone Encounter (Signed)
Patient is okay with referral to orthopaedic.

## 2017-06-24 ENCOUNTER — Encounter (INDEPENDENT_AMBULATORY_CARE_PROVIDER_SITE_OTHER): Payer: Self-pay | Admitting: Orthopaedic Surgery

## 2017-06-24 ENCOUNTER — Ambulatory Visit (INDEPENDENT_AMBULATORY_CARE_PROVIDER_SITE_OTHER): Payer: Medicare Other

## 2017-06-24 ENCOUNTER — Ambulatory Visit (INDEPENDENT_AMBULATORY_CARE_PROVIDER_SITE_OTHER): Payer: Medicare Other | Admitting: Orthopaedic Surgery

## 2017-06-24 VITALS — BP 78/54 | HR 87 | Resp 14 | Ht 61.0 in | Wt 120.0 lb

## 2017-06-24 DIAGNOSIS — M25551 Pain in right hip: Secondary | ICD-10-CM

## 2017-06-24 DIAGNOSIS — M5441 Lumbago with sciatica, right side: Secondary | ICD-10-CM | POA: Diagnosis not present

## 2017-06-24 NOTE — Progress Notes (Signed)
Office Visit Note   Patient: Haley Trevino           Date of Birth: 02-Aug-1940           MRN: [id number] Visit Date: 06/24/2017              Requested by: [name], [address] [place], [place] [address] PCP: [name], FNP   Assessment & Plan: Visit Diagnoses:  1. Pain in right hip   2. Pain of right hip joint   3. Acute right-sided low back pain with right-sided sciatica     Plan: MRI scan lumbar spine. I believe her present pain into her right lower extremity buttock is referred from the lumbar spine. She might have a compression fracture of L2 Total time spent over 45 minutes discussing present pain and potential diagnosis and treatment options Follow-Up Instructions: Return after MRI L-S spine.   Orders:  Orders Placed This Encounter  Procedures  . XR HIP UNILAT W OR W/O PELVIS 1V RIGHT  . XR Lumbar Spine 2-3 Views   No orders of the defined types were placed in this encounter.     Procedures: No procedures performed   Clinical Data: No additional findings.   Subjective: Chief Complaint  Patient presents with  . Right Hip - Pain, Injury    Haley Trevino is a 76 y o here today for chronic ORIF Right hip in 2015 She also has lumbar/buttock pain. She fell 7 weeks ago.  . Lower Back - Injury, Pain  Haley Trevino visits the office today for evaluation of pain she is having in her right buttock radiating as far distally as her right foot. This all began within the past 6 weeks. She happened to fall and on her left hip. For several days she was experiencing left hip pain with negative films. That problem resolved only to have insidious onset of right buttock pain associated with discomfort as far distally as her right foot. She's not having any numbness or tingling but rather just "pain". 3 years ago she had the compression screw fixation of her right hip fracture by DrKelling has had some trouble ever since that time. Her pain  seems to be related to her buttock referred to distally. No bruising on the right side. She denies new bowel or bladder dysfunction. She's had some mild back pain predominantly buttock and leg discomfort. She also has a history of scleroderma and Raynaud's. Treatment has included ibuprofen, cyclobenzaprine and a prednisone shot and prednisone pills.  HPI  Review of Systems  Constitutional: Negative for chills, fatigue and fever.  Eyes: Negative for itching.  Respiratory: Negative for chest tightness and shortness of breath.   Cardiovascular: Negative for chest pain, palpitations and leg swelling.  Gastrointestinal: Negative for blood in stool, constipation and diarrhea.  Endocrine: Negative for polyuria.  Genitourinary: Negative for dysuria.  Musculoskeletal: Positive for back pain. Negative for joint swelling, neck pain and neck stiffness.  Allergic/Immunologic: Negative for immunocompromised state.  Neurological: Positive for weakness. Negative for dizziness.  Hematological: Does not bruise/bleed easily.  Psychiatric/Behavioral: Positive for sleep disturbance. The patient is not nervous/anxious.      Objective: Vital Signs: BP (!) 78/54   Pulse 87   Resp 14   Ht 5\' 1"  (1.549 m)   Wt 120 lb (54.4 kg)   BMI 22.67 kg/m   Physical Exam  Ortho Exam awake alert and oriented 3. Comfortable sitting. She is using a rolling walker to aid  with her ambulation because of the pain in her right leg. No pain over the greater trochanter of her left hip. No pain with range of motion of either hip. Some percussible tenderness of her buttock area. She is thin. Straight leg raise might be positive for at 90 for some pain in the area of her buttock. Ordered sensory exam appears to be intact good reflexes. Some tenderness over the greater trochanter she is thin and this could be just referable to the compression screw bursa.  Specialty Comments:  No specialty comments available.  Imaging: Xr Hip  Unilat W Or W/o Pelvis 1v Right  Result Date: 06/24/2017 [facility] pelvis and lateral of the right hip demonstrate a previously inserted compression screw for hip fracture. The fracture has healed over the last several years the compression devices in excellent position. No complicating factors. No obvious acute changes about the right hemipelvis  Xr Lumbar Spine 2-3 Views  Result Date: 06/24/2017 Films lumbar spine and several projections. There are considerable degenerative changes throughout the lumbar spine associated with a right sided degenerative scoliosis. There is significant decrease in the disc space at L4-5 and L5-S1 with sclerosis and osteophyte formation. There is diffuse calcification of the abdominal aorta without obvious aneurysmal dilatation no evidence of the of the listhesis. There is evidence of a compression of L2. I cannot determine its age but given her recent history of a fall this could be relatively new    PMFS History: Patient Active Problem List   Diagnosis Date Noted  . Impingement syndrome of left shoulder region 12/08/2014  . Osteoporosis 09/20/2014  . Hip fracture requiring operative repair ([place]) 02/22/2014  . Hypothyroidism 11/29/2013  . Depression 11/29/2013  . GERD (gastroesophageal reflux disease) 11/29/2013  . Hypertension 11/29/2013  . Hyperlipidemia with target LDL less than 100 11/29/2013  . Thyroid cyst 04/26/2013  . MALIGNANT MELANOMA, SKIN 08/07/2010  . DYSPNEA 08/07/2010  . [name] 08/06/2010  . SCLERODERMA 08/06/2010  . Osteoarthritis 08/06/2010   Past Medical History:  Diagnosis Date  . Cataract   . Dyspnea   . Osteoarthritis   . Raynaud disease   . Scleroderma ([place])   . Thyroid disease    hypothyroidism    Family History  Problem Relation Age of Onset  . Pancreatic cancer Father   . Raynaud syndrome Father   . Cancer Father        pancreatic  . Rashes / Skin problems Daughter        possibly scleraderma  . Hip fracture  Mother   . Mental illness Mother        attempted suicide at 28 yo    Past Surgical History:  Procedure Laterality Date  . BREAST ENHANCEMENT SURGERY  1975  . BREAST IMPLANT REMOVAL Bilateral 04/23/2016   Procedure: REMOVALBILATERAL BREAST IMPLANTS;  Surgeon: Wallace Going, DO;  Location: [facility];  Service: Plastics;  Laterality: Bilateral;  . CHOLECYSTECTOMY  1973  . HAND SURGERY  08/2012; 11/2012  . MELANOMA EXCISION     at 54 yrs of age  . ORIF HIP FRACTURE Right 02/22/2014   Procedure: OPEN REDUCTION INTERNAL FIXATION RIGHT HIP;  Surgeon: Sanjuana Kava, MD;  Location: [facility];  Service: Orthopedics;  Laterality: Right;  . TOTAL ABDOMINAL HYSTERECTOMY  1974   Social History   Occupational History  . Occupation: retired Occupational psychologist at [employer]  Tobacco Use  . Smoking status: Never Smoker  . Smokeless tobacco: Never Used  . Tobacco comment:  passive tobacco smoke exposure as child  Substance and Sexual Activity  . Alcohol use: No  . Drug use: No  . Sexual activity: Yes

## 2017-06-26 ENCOUNTER — Other Ambulatory Visit (INDEPENDENT_AMBULATORY_CARE_PROVIDER_SITE_OTHER): Payer: Self-pay | Admitting: Orthopaedic Surgery

## 2017-06-26 DIAGNOSIS — M5441 Lumbago with sciatica, right side: Secondary | ICD-10-CM

## 2017-07-01 DIAGNOSIS — I7300 Raynaud's syndrome without gangrene: Secondary | ICD-10-CM | POA: Diagnosis not present

## 2017-07-01 DIAGNOSIS — L98499 Non-pressure chronic ulcer of skin of other sites with unspecified severity: Secondary | ICD-10-CM | POA: Diagnosis not present

## 2017-07-01 DIAGNOSIS — K219 Gastro-esophageal reflux disease without esophagitis: Secondary | ICD-10-CM | POA: Diagnosis not present

## 2017-07-01 DIAGNOSIS — Z6820 Body mass index (BMI) 20.0-20.9, adult: Secondary | ICD-10-CM | POA: Diagnosis not present

## 2017-07-01 DIAGNOSIS — M340 Progressive systemic sclerosis: Secondary | ICD-10-CM | POA: Diagnosis not present

## 2017-07-01 DIAGNOSIS — M542 Cervicalgia: Secondary | ICD-10-CM | POA: Diagnosis not present

## 2017-07-01 DIAGNOSIS — M810 Age-related osteoporosis without current pathological fracture: Secondary | ICD-10-CM | POA: Diagnosis not present

## 2017-07-01 DIAGNOSIS — M150 Primary generalized (osteo)arthritis: Secondary | ICD-10-CM | POA: Diagnosis not present

## 2017-07-02 ENCOUNTER — Ambulatory Visit (HOSPITAL_COMMUNITY)
Admission: RE | Admit: 2017-07-02 | Discharge: 2017-07-02 | Disposition: A | Discharge status: Home / Self Care | Payer: Medicare Other | Source: Ambulatory Visit | Attending: Orthopaedic Surgery | Admitting: Orthopaedic Surgery

## 2017-07-02 DIAGNOSIS — X58XXXA Exposure to other specified factors, initial encounter: Secondary | ICD-10-CM | POA: Insufficient documentation

## 2017-07-02 DIAGNOSIS — M48061 Spinal stenosis, lumbar region without neurogenic claudication: Secondary | ICD-10-CM | POA: Diagnosis not present

## 2017-07-02 DIAGNOSIS — S3210XA Unspecified fracture of sacrum, initial encounter for closed fracture: Secondary | ICD-10-CM | POA: Insufficient documentation

## 2017-07-02 DIAGNOSIS — M47816 Spondylosis without myelopathy or radiculopathy, lumbar region: Secondary | ICD-10-CM | POA: Diagnosis not present

## 2017-07-02 DIAGNOSIS — S3992XA Unspecified injury of lower back, initial encounter: Secondary | ICD-10-CM | POA: Diagnosis not present

## 2017-07-02 DIAGNOSIS — M545 Low back pain: Secondary | ICD-10-CM | POA: Diagnosis not present

## 2017-07-02 DIAGNOSIS — M5441 Lumbago with sciatica, right side: Secondary | ICD-10-CM | POA: Diagnosis not present

## 2017-07-03 ENCOUNTER — Telehealth (INDEPENDENT_AMBULATORY_CARE_PROVIDER_SITE_OTHER): Payer: Self-pay | Admitting: Radiology

## 2017-07-03 ENCOUNTER — Other Ambulatory Visit (INDEPENDENT_AMBULATORY_CARE_PROVIDER_SITE_OTHER): Payer: Self-pay

## 2017-07-03 DIAGNOSIS — M533 Sacrococcygeal disorders, not elsewhere classified: Secondary | ICD-10-CM

## 2017-07-03 NOTE — Telephone Encounter (Signed)
Sent referral to [facility]

## 2017-07-03 NOTE — Telephone Encounter (Signed)
Consult radiology dept at [facility] to consider inj sacral ala and or ESI

## 2017-07-03 NOTE — Telephone Encounter (Signed)
FYI

## 2017-07-03 NOTE — Telephone Encounter (Signed)
Diane from [facility] called with call report on patient's MRI Lumbar Spine done yesterday. She would like Dr. Durward Fortes to get report as soon as possible.  IMPRESSION: 1. Fractures of bilateral sacral ala and the right L5 transverse process with edema indicating recent injury. 2. Right L5 transverse pedicle edema without fracture, probable stress reaction. 3. L4-5 right and L2-3 left endplate edema without loss of vertebral body height, possibly degenerative or bone contusion. 4. Lumbar dextrocurvature with apex at L2.  Normal lumbar lordosis. 5. Lumbar spondylosis with multi level mild and moderate foraminal stenosis. Mild L3-4 and L4-5 canal stenosis. These results will be called to the ordering clinician or representative by the Radiologist Assistant, and communication documented in the PACS or zVision Dashboard.

## 2017-07-08 ENCOUNTER — Encounter (INDEPENDENT_AMBULATORY_CARE_PROVIDER_SITE_OTHER): Payer: Self-pay | Admitting: Orthopaedic Surgery

## 2017-07-08 ENCOUNTER — Telehealth (HOSPITAL_COMMUNITY): Payer: Self-pay

## 2017-07-08 ENCOUNTER — Ambulatory Visit (INDEPENDENT_AMBULATORY_CARE_PROVIDER_SITE_OTHER): Payer: Medicare Other | Admitting: Orthopaedic Surgery

## 2017-07-08 VITALS — Resp 16 | Ht 60.0 in | Wt 110.0 lb

## 2017-07-08 DIAGNOSIS — M5441 Lumbago with sciatica, right side: Secondary | ICD-10-CM

## 2017-07-08 NOTE — Telephone Encounter (Signed)
Called to schedule consult, left message for pt to return call. AW 

## 2017-07-08 NOTE — Progress Notes (Signed)
Office Visit Note   Patient: Haley Trevino           Date of Birth: Sep 28, 1940           MRN: [id number] Visit Date: 07/08/2017              Requested by: [name], [address] [place], [place] [id number] PCP: [name], FNP   Assessment & Plan: Visit Diagnoses:  1. Acute midline low back pain with right-sided sciatica     Plan: Approximately 2 months post injury where she fell on concrete and still having some back pain. MRI scan was performed and demonstrates bilateral sacral alar fractures with edema. There are degenerative changes and some mild stenosis. Haley Trevino is experiencing some right lower extremity pain that I suspect is probably referred either from her injury or from the stenosis. I think it's worth having the interventional radiologist evaluate her to consider injecting the sacral fractures. Discussed this with her she like to proceed so we'll call and refer her. Let's see her back about a week after the injection  Follow-Up Instructions: Return in about 3 weeks (around 07/29/2017).   Orders:  No orders of the defined types were placed in this encounter.  No orders of the defined types were placed in this encounter.     Procedures: No procedures performed   Clinical Data: No additional findings.   Subjective: Chief Complaint  Patient presents with  . Lower Back - Tailbone Pain    Haley Trevino is a 77 y o here for F/U MRI. [facility] will call her to schedule her IR injection. Pt requests her record sent to Dr. Amil Amen, Rheumatologist.   MRI scan is addended to the chart. There are fractures of the bilateral sacral alar and right L5 transverse process with edema indicating recent injury. There is a right L5 transverse pedicle edema probably stress reaction. Mild L3-4 and L4-5 canal stenosis with lumbar spondylosis. No bowel or bladder changes. Now using a cane rather than a walker. No discomfort in the left  leg  HPI  Review of Systems  Constitutional: Negative for chills, fatigue and fever.  Eyes: Negative for itching.  Respiratory: Negative for chest tightness and shortness of breath.   Cardiovascular: Negative for chest pain, palpitations and leg swelling.  Gastrointestinal: Negative for blood in stool, constipation and diarrhea.  Endocrine: Negative for polyuria.  Genitourinary: Negative for dysuria.  Musculoskeletal: Positive for back pain and gait problem. Negative for joint swelling, neck pain and neck stiffness.  Allergic/Immunologic: Negative for immunocompromised state.  Neurological: Positive for weakness. Negative for dizziness and numbness.  Hematological: Does not bruise/bleed easily.  Psychiatric/Behavioral: The patient is not nervous/anxious.      Objective: Vital Signs: Resp 16   Ht 5' (1.524 m)   Wt 110 lb (49.9 kg)   BMI 21.48 kg/m   Physical Exam  Ortho Exam straight leg raise is negative bilaterally. Reflexes were brisk and symmetrical. Normal strength bilaterally. Normal sensibility. Painless range of motion of both hips and both knees. Percussible tenderness along the sacrum consistent with a sacral fractures. No masses. No significant percussible back pain  Specialty Comments:  No specialty comments available.  Imaging: No results found.   PMFS History: Patient Active Problem List   Diagnosis Date Noted  . Impingement syndrome of left shoulder region 12/08/2014  . Osteoporosis 09/20/2014  . Hip fracture requiring operative repair ([place]) 02/22/2014  . Hypothyroidism 11/29/2013  . Depression 11/29/2013  . GERD (  gastroesophageal reflux disease) 11/29/2013  . Hypertension 11/29/2013  . Hyperlipidemia with target LDL less than 100 11/29/2013  . Thyroid cyst 04/26/2013  . MALIGNANT MELANOMA, SKIN 08/07/2010  . DYSPNEA 08/07/2010  . [name] 08/06/2010  . SCLERODERMA 08/06/2010  . Osteoarthritis 08/06/2010   Past Medical History:   Diagnosis Date  . Cataract   . Dyspnea   . Osteoarthritis   . Raynaud disease   . Scleroderma ([place])   . Thyroid disease    hypothyroidism    Family History  Problem Relation Age of Onset  . Pancreatic cancer Father   . Raynaud syndrome Father   . Cancer Father        pancreatic  . Rashes / Skin problems Daughter        possibly scleraderma  . Hip fracture Mother   . Mental illness Mother        attempted suicide at 50 yo    Past Surgical History:  Procedure Laterality Date  . BREAST ENHANCEMENT SURGERY  1975  . BREAST IMPLANT REMOVAL Bilateral 04/23/2016   Procedure: REMOVALBILATERAL BREAST IMPLANTS;  Surgeon: Wallace Going, DO;  Location: [place];  Service: Plastics;  Laterality: Bilateral;  . CHOLECYSTECTOMY  1973  . HAND SURGERY  08/2012; 11/2012  . MELANOMA EXCISION     at 3 yrs of age  . ORIF HIP FRACTURE Right 02/22/2014   Procedure: OPEN REDUCTION INTERNAL FIXATION RIGHT HIP;  Surgeon: Sanjuana Kava, MD;  Location: [facility];  Service: Orthopedics;  Laterality: Right;  . TOTAL ABDOMINAL HYSTERECTOMY  1974   Social History   Occupational History  . Occupation: retired Occupational psychologist at [employer]  Tobacco Use  . Smoking status: Never Smoker  . Smokeless tobacco: Never Used  . Tobacco comment: passive tobacco smoke exposure as child  Substance and Sexual Activity  . Alcohol use: No  . Drug use: No  . Sexual activity: Yes

## 2017-07-14 ENCOUNTER — Other Ambulatory Visit (HOSPITAL_COMMUNITY): Payer: Self-pay | Admitting: Interventional Radiology

## 2017-07-14 DIAGNOSIS — S3210XA Unspecified fracture of sacrum, initial encounter for closed fracture: Secondary | ICD-10-CM

## 2017-07-27 ENCOUNTER — Other Ambulatory Visit (HOSPITAL_COMMUNITY): Payer: Self-pay | Admitting: Interventional Radiology

## 2017-07-27 ENCOUNTER — Ambulatory Visit (HOSPITAL_COMMUNITY)
Admission: RE | Admit: 2017-07-27 | Discharge: 2017-07-27 | Disposition: A | Discharge status: Home / Self Care | Payer: Medicare Other | Source: Ambulatory Visit | Attending: Interventional Radiology | Admitting: Interventional Radiology

## 2017-07-27 DIAGNOSIS — M4186 Other forms of scoliosis, lumbar region: Secondary | ICD-10-CM | POA: Diagnosis not present

## 2017-07-27 DIAGNOSIS — S3210XA Unspecified fracture of sacrum, initial encounter for closed fracture: Secondary | ICD-10-CM

## 2017-07-27 DIAGNOSIS — M545 Low back pain: Secondary | ICD-10-CM | POA: Diagnosis not present

## 2017-07-27 HISTORY — PX: IR RADIOLOGIST EVAL & MGMT: IMG5224

## 2017-07-28 ENCOUNTER — Encounter (HOSPITAL_COMMUNITY): Payer: Self-pay | Admitting: Interventional Radiology

## 2017-07-28 ENCOUNTER — Other Ambulatory Visit: Payer: Self-pay | Admitting: Student

## 2017-07-29 ENCOUNTER — Other Ambulatory Visit: Payer: Self-pay | Admitting: Radiology

## 2017-07-30 ENCOUNTER — Other Ambulatory Visit: Payer: Self-pay | Admitting: Radiology

## 2017-07-31 ENCOUNTER — Ambulatory Visit (HOSPITAL_COMMUNITY)
Admission: RE | Admit: 2017-07-31 | Discharge: 2017-07-31 | Disposition: A | Discharge status: Home / Self Care | Payer: Medicare Other | Source: Ambulatory Visit | Attending: Interventional Radiology | Admitting: Interventional Radiology

## 2017-07-31 ENCOUNTER — Encounter (HOSPITAL_COMMUNITY): Payer: Self-pay

## 2017-07-31 ENCOUNTER — Other Ambulatory Visit: Payer: Self-pay | Admitting: Physician Assistant

## 2017-07-31 DIAGNOSIS — E039 Hypothyroidism, unspecified: Secondary | ICD-10-CM | POA: Diagnosis not present

## 2017-07-31 DIAGNOSIS — R0789 Other chest pain: Secondary | ICD-10-CM

## 2017-07-31 DIAGNOSIS — M4807 Spinal stenosis, lumbosacral region: Secondary | ICD-10-CM | POA: Diagnosis not present

## 2017-07-31 DIAGNOSIS — S32110A Nondisplaced Zone I fracture of sacrum, initial encounter for closed fracture: Secondary | ICD-10-CM | POA: Diagnosis not present

## 2017-07-31 DIAGNOSIS — R0609 Other forms of dyspnea: Secondary | ICD-10-CM

## 2017-07-31 DIAGNOSIS — Z79899 Other long term (current) drug therapy: Secondary | ICD-10-CM | POA: Diagnosis not present

## 2017-07-31 DIAGNOSIS — M47817 Spondylosis without myelopathy or radiculopathy, lumbosacral region: Secondary | ICD-10-CM | POA: Diagnosis not present

## 2017-07-31 DIAGNOSIS — S32119A Unspecified Zone I fracture of sacrum, initial encounter for closed fracture: Secondary | ICD-10-CM | POA: Insufficient documentation

## 2017-07-31 DIAGNOSIS — W1830XA Fall on same level, unspecified, initial encounter: Secondary | ICD-10-CM | POA: Diagnosis not present

## 2017-07-31 DIAGNOSIS — M545 Low back pain: Secondary | ICD-10-CM | POA: Diagnosis not present

## 2017-07-31 DIAGNOSIS — S3210XA Unspecified fracture of sacrum, initial encounter for closed fracture: Secondary | ICD-10-CM

## 2017-07-31 HISTORY — PX: IR SACROPLASTY BILATERAL: IMG5561

## 2017-07-31 LAB — CBC
HEMATOCRIT: 33.3 % — AB (ref 36.0–46.0)
HEMOGLOBIN: 10.4 g/dL — AB (ref 12.0–15.0)
MCH: 26.5 pg (ref 26.0–34.0)
MCHC: 31.2 g/dL (ref 30.0–36.0)
MCV: 84.7 fL (ref 78.0–100.0)
Platelets: 421 10*3/uL — ABNORMAL HIGH (ref 150–400)
RBC: 3.93 MIL/uL (ref 3.87–5.11)
RDW: 15.9 % — ABNORMAL HIGH (ref 11.5–15.5)
WBC: 9.9 10*3/uL (ref 4.0–10.5)

## 2017-07-31 LAB — COMPREHENSIVE METABOLIC PANEL
ALBUMIN: 3.6 g/dL (ref 3.5–5.0)
ALK PHOS: 79 U/L (ref 38–126)
ALT: 9 U/L — AB (ref 14–54)
AST: 18 U/L (ref 15–41)
Anion gap: 11 (ref 5–15)
BUN: 13 mg/dL (ref 6–20)
CALCIUM: 8.9 mg/dL (ref 8.9–10.3)
CHLORIDE: 106 mmol/L (ref 101–111)
CO2: 22 mmol/L (ref 22–32)
CREATININE: 0.68 mg/dL (ref 0.44–1.00)
GFR calc Af Amer: >60 mL/min (ref >60)
GFR calc non Af Amer: >60 mL/min (ref >60)
GLUCOSE: 105 mg/dL — AB (ref 65–99)
Potassium: 4.4 mmol/L (ref 3.5–5.1)
Sodium: 139 mmol/L (ref 135–145)
Total Bilirubin: 0.3 mg/dL (ref 0.3–1.2)
Total Protein: 6.8 g/dL (ref 6.5–8.1)

## 2017-07-31 LAB — APTT: aPTT: 36 seconds (ref 24–36)

## 2017-07-31 LAB — PROTIME-INR
INR: 0.99
Prothrombin Time: 13 seconds (ref 11.4–15.2)

## 2017-07-31 MED ORDER — BUPIVACAINE HCL (PF) 0.5 % IJ SOLN
INTRAMUSCULAR | Status: AC
  Filled 2017-07-31: qty 30

## 2017-07-31 MED ORDER — TOBRAMYCIN SULFATE 1.2 G IJ SOLR
INTRAMUSCULAR | Status: AC
  Filled 2017-07-31: qty 1.2

## 2017-07-31 MED ORDER — SODIUM CHLORIDE 0.9 % IV SOLN: INTRAVENOUS | Status: AC

## 2017-07-31 MED ORDER — CEFAZOLIN SODIUM-DEXTROSE 2-4 GM/100ML-% IV SOLN
INTRAVENOUS | Status: AC | PRN
  Administered 2017-07-31: 2 g via INTRAVENOUS

## 2017-07-31 MED ORDER — MIDAZOLAM HCL 2 MG/2ML IJ SOLN
INTRAMUSCULAR | Status: AC | PRN
  Administered 2017-07-31: 1 mg via INTRAVENOUS
  Administered 2017-07-31: 0.5 mg via INTRAVENOUS
  Administered 2017-07-31 (×2): 1 mg via INTRAVENOUS

## 2017-07-31 MED ORDER — IOPAMIDOL (ISOVUE-300) INJECTION 61%
INTRAVENOUS | Status: AC
  Administered 2017-07-31: 8 mL
  Filled 2017-07-31: qty 50

## 2017-07-31 MED ORDER — BUPIVACAINE HCL (PF) 0.5 % IJ SOLN
INTRAMUSCULAR | Status: AC | PRN
  Administered 2017-07-31: 35 mL

## 2017-07-31 MED ORDER — FENTANYL CITRATE (PF) 100 MCG/2ML IJ SOLN
INTRAMUSCULAR | Status: AC
  Filled 2017-07-31: qty 4

## 2017-07-31 MED ORDER — MIDAZOLAM HCL 2 MG/2ML IJ SOLN
INTRAMUSCULAR | Status: AC
  Filled 2017-07-31: qty 4

## 2017-07-31 MED ORDER — SODIUM CHLORIDE 0.9 % IV SOLN: INTRAVENOUS | Status: DC

## 2017-07-31 MED ORDER — CEFAZOLIN SODIUM-DEXTROSE 2-4 GM/100ML-% IV SOLN
INTRAVENOUS | Status: AC
  Filled 2017-07-31: qty 100

## 2017-07-31 MED ORDER — FENTANYL CITRATE (PF) 100 MCG/2ML IJ SOLN
INTRAMUSCULAR | Status: AC | PRN
  Administered 2017-07-31 (×3): 25 ug via INTRAVENOUS
  Administered 2017-07-31: 12.5 ug via INTRAVENOUS
  Administered 2017-07-31: 25 ug via INTRAVENOUS
  Administered 2017-07-31: 50 ug via INTRAVENOUS

## 2017-07-31 MED ORDER — TOBRAMYCIN SULFATE 1.2 G IJ SOLR
INTRAMUSCULAR | Status: AC | PRN
  Administered 2017-07-31: .1 g via TOPICAL

## 2017-07-31 NOTE — Sedation Documentation (Signed)
Patient is resting comfortably. 

## 2017-07-31 NOTE — H&P (Signed)
Chief Complaint: Patient was seen in consultation today for sacroplasty at the request of Dr Cordella Register   Supervising Physician: Luanne Bras  Patient Status: [facility] - Out-pt  History of Present Illness: Haley Trevino is a 77 y.o. female   [place] onto concrete few weeks ago Has had low back and buttock pain since Cannot sit for any length--- worsening pain  07/02/17 MRI: IMPRESSION: 1. Fractures of bilateral sacral ala and the right L5 transverse process with edema indicating recent injury. 2. Right L5 transverse pedicle edema without fracture, probable stress reaction. 3. L4-5 right and L2-3 left endplate edema without loss of vertebral body height, possibly degenerative or bone contusion. 4. Lumbar dextrocurvature with apex at L2.  Normal lumbar lordosis. 5. Lumbar spondylosis with multi level mild and moderate foraminal stenosis. Mild L3-4 and L4-5 canal stenosis. These results will be called to the ordering clinician or representative by the Radiologist Assistant, and communication documented in the PACS or zVision Dashboard.  Scheduled now for Bilateral sacroplasty   Past Medical History:  Diagnosis Date  . Cataract   . Dyspnea   . Osteoarthritis   . Raynaud disease   . Scleroderma ([place])   . Thyroid disease    hypothyroidism    Past Surgical History:  Procedure Laterality Date  . BREAST ENHANCEMENT SURGERY  1975  . BREAST IMPLANT REMOVAL Bilateral 04/23/2016   Procedure: REMOVALBILATERAL BREAST IMPLANTS;  Surgeon: Wallace Going, DO;  Location: [place];  Service: Plastics;  Laterality: Bilateral;  . CHOLECYSTECTOMY  1973  . HAND SURGERY  08/2012; 11/2012  . IR RADIOLOGIST EVAL & MGMT  07/27/2017  . MELANOMA EXCISION     at 44 yrs of age  . ORIF HIP FRACTURE Right 02/22/2014   Procedure: OPEN REDUCTION INTERNAL FIXATION RIGHT HIP;  Surgeon: Sanjuana Kava, MD;  Location: [facility];  Service: Orthopedics;  Laterality: Right;  .  TOTAL ABDOMINAL HYSTERECTOMY  1974    Allergies: Codeine; Dilaudid [hydromorphone hcl]; Hydromorphone; Acyclovir and related; Gabapentin; Aleve [naproxen sodium]; and Lyrica [pregabalin]  Medications: Prior to Admission medications   Medication Sig Start Date End Date Taking? Authorizing Provider  clonazePAM (KLONOPIN) 0.5 MG tablet TAKE 1 TABLET TWICE DAILY AS NEEDED FOR ANXIETY 04/02/17  Yes Hassell Done, Mary-Margaret, FNP  docusate sodium (COLACE) 100 MG capsule Take 100 mg by mouth daily.    Yes [provider]  Ferrous Sulfate Dried (SLOW RELEASE IRON) 45 MG TBCR Take 1 tablet by mouth daily.   Yes [provider]  FLUoxetine (PROZAC) 40 MG capsule TAKE (1) CAPSULE DAILY 01/06/17  Yes Hassell Done, Mary-Margaret, FNP  ibuprofen (ADVIL,MOTRIN) 200 MG tablet Take 600 mg by mouth every 8 (eight) hours as needed for mild pain or moderate pain.   Yes [provider]  NIFEdipine (PROCARDIA XL/ADALAT-CC) 90 MG 24 hr tablet Take 1 tablet (90 mg total) by mouth daily. 01/06/17  Yes Martin, Mary-Margaret, FNP  omeprazole (PRILOSEC) 20 MG capsule Take 1 capsule (20 mg total) by mouth daily. 01/06/17  Yes Hassell Done, Mary-Margaret, FNP  Probiotic Product ([place]) Take 1 capsule by mouth daily.    Yes [provider]  sildenafil (REVATIO) 20 MG tablet Take 20 mg by mouth 3 (three) times daily.   Yes [provider]  SYNTHROID 75 MCG tablet Take 1 tablet (75 mcg total) by mouth daily before breakfast. 04/02/17  Yes Hassell Done Mary-Margaret, FNP     Family History  Problem Relation Age of Onset  .  Pancreatic cancer Father   . Raynaud syndrome Father   . Cancer Father        pancreatic  . Rashes / Skin problems Daughter        possibly scleraderma  . Hip fracture Mother   . Mental illness Mother        attempted suicide at 60 yo    Social History   Socioeconomic History  . Marital status: Married    Spouse name: None  . Number of children: None  .  Years of education: None  . Highest education level: None  Social Needs  . Financial resource strain: None  . Food insecurity - worry: None  . Food insecurity - inability: None  . Transportation needs - medical: None  . Transportation needs - non-medical: None  Occupational History  . Occupation: retired Occupational psychologist at [employer]  Tobacco Use  . Smoking status: Never Smoker  . Smokeless tobacco: Never Used  . Tobacco comment: passive tobacco smoke exposure as child  Substance and Sexual Activity  . Alcohol use: No  . Drug use: No  . Sexual activity: Yes  Other Topics Concern  . None  Social History Narrative   Regular exercise: walking   Caffeine use: 1 cup of coffee daily    Review of Systems: A 12 point ROS discussed and pertinent positives are indicated in the HPI above.  All other systems are negative.  Review of Systems  Constitutional: Positive for activity change. Negative for appetite change, fatigue and fever.  Respiratory: Negative for cough and shortness of breath.   Gastrointestinal: Negative for abdominal pain.  Musculoskeletal: Positive for back pain and gait problem.  Neurological: Negative for weakness.  Psychiatric/Behavioral: Negative for behavioral problems and confusion.    Vital Signs: BP 136/80   Pulse 74   Temp 98.2 F (36.8 C) (Oral)   Resp 16   Ht 5\' 2"  (1.575 m)   Wt 111 lb (50.3 kg)   SpO2 98%   BMI 20.30 kg/m   Physical Exam  Cardiovascular: Normal rate, regular rhythm and normal heart sounds.  Pulmonary/Chest: Effort normal and breath sounds normal.  Abdominal: Soft. Bowel sounds are normal.  Musculoskeletal: Normal range of motion.  Skin: Skin is warm and dry.  Psychiatric: She has a normal mood and affect. Her behavior is normal. Judgment and thought content normal.  Nursing note and vitals reviewed.   Imaging: Mr Lumbar Spine Wo Contrast  Result Date: 07/03/2017 CLINICAL DATA:  77 y/o F; insula lower back pain extending  throughout the right leg with weakness. Status post fall. EXAM: MRI LUMBAR SPINE WITHOUT CONTRAST TECHNIQUE: Multiplanar, multisequence MR imaging of the lumbar spine was performed. No intravenous contrast was administered. COMPARISON:  None. FINDINGS: Segmentation:  Standard. Alignment: Moderate lumbar dextrocurvature with apex at L2. Normal lumbar lordosis without listhesis. Vertebrae: Bilateral sacral ala fractures and fracture of the right L5 transverse process (series 9, image 11). Edema within the right L5 pedicle without fracture, probably stress reaction. Edema within the L4-5 right and L2-3 left endplates without vertebral body loss of height may be degenerative or represent bone contusion. Conus medullaris and cauda equina: Conus extends to the L1-2 level. Conus and cauda equina appear normal. Paraspinal and other soft tissues: Negative. Disc levels: L1-2: Small disc bulge. No significant foraminal or canal stenosis. L2-3: Small disc bulge eccentric to the left with left-greater-than-right facet hypertrophy. Moderate left foraminal stenosis. No significant canal stenosis. L3-4: Small disc bulge with bilateral facet and uncovertebral  hypertrophy. Mild bilateral foraminal stenosis and canal stenosis. L4-5: Small disc bulge with endplate marginal osteophytes eccentric to the right foraminal and extraforaminal zone. Right greater than left facet hypertrophy. Moderate right and mild left foraminal stenosis. Mild canal stenosis. L5-S1: Small disc bulge with right foraminal and extraforaminal marginal osteophytes and right greater than left facet hypertrophy. Moderate right and mild left foraminal stenosis. No significant canal stenosis. IMPRESSION: 1. Fractures of bilateral sacral ala and the right L5 transverse process with edema indicating recent injury. 2. Right L5 transverse pedicle edema without fracture, probable stress reaction. 3. L4-5 right and L2-3 left endplate edema without loss of vertebral body  height, possibly degenerative or bone contusion. 4. Lumbar dextrocurvature with apex at L2.  Normal lumbar lordosis. 5. Lumbar spondylosis with multi level mild and moderate foraminal stenosis. Mild L3-4 and L4-5 canal stenosis. These results will be called to the ordering clinician or representative by the Radiologist Assistant, and communication documented in the PACS or zVision Dashboard. Electronically Signed   By: Kristine Garbe M.D.   On: 07/03/2017 02:07   Ir Radiologist Eval & Mgmt  Result Date: 07/28/2017 EXAM: NEW PATIENT OFFICE VISIT CHIEF COMPLAINT: Severe lumbosacral pain secondary to sacral insufficiency fractures. Current Pain Level: 1-10 HISTORY OF PRESENT ILLNESS: The patient is a 77 year old right-handed lady who has been referred for evaluation and management of severe lumbosacral pain related to sacral fractures bilaterally. The patient is accompanied by her husband. Basically, the patient reports acute onset of symptoms approximately 3 months ago after her fall on her buttocks on concrete. The pain was almost immediate with intense severity. At that time, the pain also radiated down her right buttock over the outer lateral aspect of her right thigh and the right calf and into the right side of her foot. However, that part of her symptomatology has significantly improved. Her pain at the present time is constant 6 out of 10 being worse after prolonged sitting whereby the patient is unable to stand up without the use of support. She ambulates with a cane or a walker at home. She is unable to perform her daily activities such as cleaning or stooping over. She has noted she has difficulty driving with prolonged sitting. Her appetite has been decreased since her hip surgery. She, at the present time, uses supplements or protein. Her weight is, otherwise, mostly stable. She denies autonomic dysfunction of her bowel or bladder activities. She also denies symptoms of UTI such as dysuria,  hematuria or polyuria. She denies fever, chills or rigors. She remains constipated on account of her pain and also pain medications. Otherwise, no diarrhea, no blood in her stool. She denies any chest pain, shortness of breath or of paroxysmal nocturnal dyspnea. She denies recent cough, hemoptysis or wheezing. Past Medical History: Arthritis, scleroderma, Raynaud's phenomenon, melanoma in the distant past, depression, high blood pressure, reflux indigestion and thyroid disease. Past Surgical History: Right hip surgery, hysterectomy and melanoma removal in the distant past. She has also had some form of hand surgery. Medications: Clonazepam, Colace for constipation, ferrous sulfate, Prozac for depression, nifedipine for Raynaud's, omeprazole, probiotics, Revatio, Synthroid. Allergies: Codeine causes anaphylaxis. Dilaudid and hydromorphone also causes anaphylaxis. Acyclovir - patient unable to recollect, however, symptoms with acyclovir. Gabapentin, Lyrica and Aleve cause generalized feet and leg swelling. Social History: Patient is married lives with her husband. Has two children. She denies drinking alcohol or using illicit chemicals, or smoking tobacco. Family History: Mother deceased age 44 of sepsis. Father deceased age  55 of pancreatic cancer. Son deceased age 70 suicide. REVIEW OF SYSTEMS: Negative unless as mentioned above. PHYSICAL EXAMINATION: Appears to be in an moderate distress on account of the pain in her low back and buttock region while sitting down. Affect appropriate to the situation. Neurologically, otherwise, no gross lateralizing abnormalities involving the cranial nerves, motor, sensory, coordination or station and gait. ASSESSMENT AND PLAN: The patient's recent MRI scan of the lumbosacral spine was reviewed with her and her husband. Brought to their attention was the abnormal signal in the sacral alae bilaterally, and also the right transverse process. The former probably represented sacral  fractures, subacute in nature, with the increased signal in the right transverse process possibly representing reactive contusion. Degenerative disc disease probably degenerative changes at L4-5 and L2-L3. Dextrocurvature of her lumbar spine. The option of augmentation of the alar regions with methylmethacrylate mixture to prevent worsening of her sacral fractures, and also to alleviate pain was discussed with the patient and the patient's husband. The procedure, the reasons, and the alternatives were all reviewed with her. Questions were answered to their satisfaction. The patient and her husband are in agreement to proceed with sacral S1 methylmethacrylate augmentation. This will be scheduled as soon as possible. In the meantime, she has been advised to continue using either a cane or walker to ambulate, and also to ensure she avoid stooping, bending or lifting weights above 10 pounds. They both leave with good understanding and agreement with the above management plan. Electronically Signed   By: Luanne Bras M.D.   On: 07/27/2017 14:08    Labs:  CBC: Recent Labs    04/29/17 1620  WBC 9.1  HGB 11.4  HCT 31.2*  PLT 343    COAGS: Recent Labs    07/31/17 0550  INR 0.99  APTT 36    BMP: Recent Labs    10/07/16 1533 01/06/17 1459 04/02/17 1112 04/29/17 1620  NA 143 140 140 138  K 3.9 4.0 4.5 4.1  CL 103 105 102 101  CO2 23 21 25 22   GLUCOSE 84 99 83 105*  BUN 9 19 9 9   CALCIUM 9.0 9.2 9.1 8.9  CREATININE 0.69 0.77 0.84 0.74  GFRNONAA 85 76 68 80  GFRAA 98 87 79 92    LIVER FUNCTION TESTS: Recent Labs    10/07/16 1533 01/06/17 1459 04/02/17 1112 04/29/17 1620  BILITOT <0.2 <0.2 <0.2 <0.2  AST 15 16 16 25   ALT 8 11 9  34*  ALKPHOS 58 57 70 78  PROT 7.2 6.6 7.0 7.0  ALBUMIN 4.2 4.2 4.2 4.0    TUMOR MARKERS: No results for input(s): AFPTM, CEA, CA199, CHROMGRNA in the last 8760 hours.  Assessment and Plan:  Low back pain --- and worsening since fall few  weeks ago MR show B sacral ala fx Now scheduled for Sacroplasty  Risks and benefits of Sacroplasty were discussed with the patient including, but not limited to education regarding the natural healing process of compression fractures without intervention, bleeding, infection, cement migration which may cause spinal cord damage, paralysis, pulmonary embolism or even death. This interventional procedure involves the use of X-rays and because of the nature of the planned procedure, it is possible that we will have prolonged use of X-ray fluoroscopy. Potential radiation risks to you include (but are not limited to) the following: - A slightly elevated risk for cancer  several years later in life. This risk is typically less than 0.5% percent. This risk is low in  comparison to the normal incidence of human cancer, which is 33% for women and 50% for men according to the [place]. - Radiation induced injury can include skin redness, resembling a rash, tissue breakdown / ulcers and hair loss (which can be temporary or permanent).  The likelihood of either of these occurring depends on the difficulty of the procedure and whether you are sensitive to radiation due to previous procedures, disease, or genetic conditions.  IF your procedure requires a prolonged use of radiation, you will be notified and given written instructions for further action.  It is your responsibility to monitor the irradiated area for the 2 weeks following the procedure and to notify your physician if you are concerned that you have suffered a radiation induced injury.    All of the patient's questions were answered, patient is agreeable to proceed. Consent signed and in chart.  Thank you for this interesting consult.  I greatly enjoyed meeting MARJIE CHEA and look forward to participating in their care.  A copy of this report was sent to the requesting provider on this date.  Electronically  Signed: Lavonia Drafts, PA-C 07/31/2017, 6:54 AM   I spent a total of  30 Minutes   in face to face in clinical consultation, greater than 50% of which was counseling/coordinating care for sacroplasty

## 2017-07-31 NOTE — Discharge Instructions (Signed)
KYPHOPLASTY/VERTEBROPLASTY DISCHARGE INSTRUCTIONS  Medications: (check all that apply)     Resume all home medications as before procedure.       Resume your (aspirin/Plavix/Coumadin) on per MD instructions.                  Continue your pain medications as prescribed as needed.  Over the next 3-5 days, decrease your pain medication as tolerated.  Over the counter medications (i.e. Tylenol, ibuprofen, and aleve) may be substituted once severe/moderate pain symptoms have subsided.   Wound Care: - Bandages may be removed the day following your procedure.  You may get your incision wet once bandages are removed.  Bandaids may be used to cover the incisions until scab formation.  Topical ointments are optional.  - If you develop a fever greater than 101 degrees, have increased skin redness at the incision sites or pus-like oozing from incisions occurring within 1 week of the procedure, contact radiology at [phone] or [phone].  - Ice pack to back for 15-20 minutes 2-3 time per day for first 2-3 days post procedure.  The ice will expedite muscle healing and help with the pain from the incisions.   Activity: - Bedrest today with limited activity for 24 hours post procedure.  - No driving for 48 hours.  - Increase your activity as tolerated after bedrest (with assistance if necessary).  - Refrain from any strenuous activity or heavy lifting (greater than 10 lbs.).   Follow up: - Contact radiology at [phone] or [phone] if any questions/concerns.  - A physician assistant from radiology will contact you in approximately 1 week.  - If a biopsy was performed at the time of your procedure, your referring physician should receive the results in usually 2-3 days.         Moderate Conscious Sedation, Adult, Care After These instructions provide you with information about caring for yourself after your procedure. Your health care provider may also give you more specific instructions. Your  treatment has been planned according to current medical practices, but problems sometimes occur. Call your health care provider if you have any problems or questions after your procedure. What can I expect after the procedure? After your procedure, it is common:  To feel sleepy for several hours.  To feel clumsy and have poor balance for several hours.  To have poor judgment for several hours.  To vomit if you eat too soon.  Follow these instructions at home: For at least 24 hours after the procedure:   Do not: ? Participate in activities where you could fall or become injured. ? Drive. ? Use heavy machinery. ? Drink alcohol. ? Take sleeping pills or medicines that cause drowsiness. ? Make important decisions or sign legal documents. ? Take care of children on your own.  Rest. Eating and drinking  Follow the diet recommended by your health care provider.  If you vomit: ? Drink water, juice, or soup when you can drink without vomiting. ? Make sure you have little or no nausea before eating solid foods. General instructions  Have a responsible adult stay with you until you are awake and alert.  Take over-the-counter and prescription medicines only as told by your health care provider.  If you smoke, do not smoke without supervision.  Keep all follow-up visits as told by your health care provider. This is important. Contact a health care provider if:  You keep feeling nauseous or you keep vomiting.  You feel light-headed.  You develop  a rash.  You have a fever. Get help right away if:  You have trouble breathing. This information is not intended to replace advice given to you by your health care provider. Make sure you discuss any questions you have with your health care provider. Document Released: 04/13/2013 Document Revised: 11/26/2015 Document Reviewed: 10/13/2015 Elsevier Interactive Patient Education  [employer].

## 2017-07-31 NOTE — Progress Notes (Signed)
Dr Stanford Breed visited pt/ pt can be [place] now.

## 2017-07-31 NOTE — Procedures (Signed)
S/P sacral augmentation via bilateral  approach.

## 2017-07-31 NOTE — Consult Note (Signed)
Cardiology Consultation:   Patient ID: SHAWNICE TILMON; [id number]; [birth year]   Admit date: 07/31/2017 Date of Consult: 07/31/2017  Primary Care Provider: [name], FNP Primary Cardiologist: New, Dr Percival Spanish Primary Electrophysiologist:  n/a   Patient Profile:   Haley Trevino is a 77 y.o. female with a hx of sacral fx after a fall 04/2017 w/ R sciatica, OA, Raynaud's dz, Scleroderma, hypothyroid, who is being seen today for the evaluation of chest pain at the request of Dr Kathi Ludwig.  History of Present Illness:   Haley Trevino has been evaluated for her back pain by PCP>>Dr Durward Fortes, then referred to IR for bilateral sacral alar fx w/ edema and mild stenosis seen on MRI. She was scheduled for injection of the fx and came to the hospital today for this.   She had sacral augmentation via bilateral  approach with no immediate complications. However, in the recovery area, she developed chest pain, cards asked to see.   She had a hip replacement 3 years ago, that slowed her down some, but she generally had a good activity level till she fell 04/2017. She used a cane for a while and got better, but then got worse. The sciatica started and she is currently using a walker. She is able to do housework but her ambulation is limited by her back pain.   She never gets chest pain with exertion. Prior to her fall, she was active around the house and yard, did not feel any limitations to her activity.   She does get SOB going up the 7 stairs in her split-level home. This is fairly recent in onset, happened after she fell. She denies LE edema, orthopnea or PND. She gets LE edema only as a S.E. To Lyrica or Gabapentin.   She has problems w/ both hands due to Raynauds/scleroderma, having trouble grasping and holding on to things. She also has problems w/ arthritis as well.  Today, after the procedure, she developed chest tightness, 8/10. She had just had some ginger ale. She did  not feel SOB, and no nausea. Mild diaphoresis.   She had similar symptoms in her youth, related to allergy to codeine (had to go to the hospital for treatment twice). Same sx happened when she was given dilaudid after surgery. No allergy to oxy- or hydrocodone.  She got versed and fentanyl today for the procedure.   She sat up after the ECG, and started burping. The burping relieved the pain. The pain returned several times, always relieved by burping. She ate crackers and a sandwich, chest pain did not return.   She thought she was allergic to something, but now thinks it may have been GI.    Past Medical History:  Diagnosis Date  . Cataract   . Dyspnea   . Osteoarthritis   . Raynaud disease   . Scleroderma ([place])   . Thyroid disease    hypothyroidism    Past Surgical History:  Procedure Laterality Date  . BREAST ENHANCEMENT SURGERY  1975  . BREAST IMPLANT REMOVAL Bilateral 04/23/2016   Procedure: REMOVALBILATERAL BREAST IMPLANTS;  Surgeon: Wallace Going, DO;  Location: [facility];  Service: Plastics;  Laterality: Bilateral;  . CHOLECYSTECTOMY  1973  . HAND SURGERY  08/2012; 11/2012  . IR RADIOLOGIST EVAL & MGMT  07/27/2017  . MELANOMA EXCISION     at 38 yrs of age  . ORIF HIP FRACTURE Right 02/22/2014   Procedure: OPEN REDUCTION INTERNAL FIXATION RIGHT HIP;  Surgeon: Patrick Jupiter  Luna Glasgow, MD;  Location: [facility];  Service: Orthopedics;  Laterality: Right;  . TOTAL ABDOMINAL HYSTERECTOMY  1974     Prior to Admission medications   Medication Sig Start Date End Date Taking? Authorizing Provider  clonazePAM (KLONOPIN) 0.5 MG tablet TAKE 1 TABLET TWICE DAILY AS NEEDED FOR ANXIETY 04/02/17  Yes Hassell Done, Mary-Margaret, FNP  docusate sodium (COLACE) 100 MG capsule Take 100 mg by mouth daily.    Yes [provider]  Ferrous Sulfate Dried (SLOW RELEASE IRON) 45 MG TBCR Take 1 tablet by mouth daily.   Yes [provider]  FLUoxetine (PROZAC) 40 MG capsule TAKE  (1) CAPSULE DAILY 01/06/17  Yes Hassell Done, Mary-Margaret, FNP  ibuprofen (ADVIL,MOTRIN) 200 MG tablet Take 600 mg by mouth every 8 (eight) hours as needed for mild pain or moderate pain.   Yes [provider]  NIFEdipine (PROCARDIA XL/ADALAT-CC) 90 MG 24 hr tablet Take 1 tablet (90 mg total) by mouth daily. 01/06/17  Yes Martin, Mary-Margaret, FNP  omeprazole (PRILOSEC) 20 MG capsule Take 1 capsule (20 mg total) by mouth daily. 01/06/17  Yes Hassell Done, Mary-Margaret, FNP  Probiotic Product ([place]) Take 1 capsule by mouth daily.    Yes [provider]  sildenafil (REVATIO) 20 MG tablet Take 20 mg by mouth 3 (three) times daily.   Yes [provider]  SYNTHROID 75 MCG tablet Take 1 tablet (75 mcg total) by mouth daily before breakfast. 04/02/17  Yes [name], FNP    Inpatient Medications: Scheduled Meds: . bupivacaine      . bupivacaine      . tobramycin       Continuous Infusions: . sodium chloride     PRN Meds:   Allergies:    Allergies  Allergen Reactions  . Codeine Shortness Of Breath    REACTION: dyspnea  . Dilaudid [Hydromorphone Hcl] Anaphylaxis  . Hydromorphone Anaphylaxis  . Acyclovir And Related   . Gabapentin Swelling    Swelling in feet, and in legs  . Aleve [Naproxen Sodium] Rash  . Lyrica [Pregabalin] Other (See Comments)    Swelling in feet, and in legs    Social History:   Social History   Socioeconomic History  . Marital status: Married    Spouse name: Not on file  . Number of children: Not on file  . Years of education: Not on file  . Highest education level: Not on file  Social Needs  . Financial resource strain: Not on file  . Food insecurity - worry: Not on file  . Food insecurity - inability: Not on file  . Transportation needs - medical: Not on file  . Transportation needs - non-medical: Not on file  Occupational History  . Occupation: retired Occupational psychologist at [employer]  Tobacco Use  . Smoking  status: Never Smoker  . Smokeless tobacco: Never Used  . Tobacco comment: passive tobacco smoke exposure as child  Substance and Sexual Activity  . Alcohol use: No  . Drug use: No  . Sexual activity: Yes  Other Topics Concern  . Not on file  Social History Narrative   Regular exercise: walking   Caffeine use: 1 cup of coffee daily    Family History:   Family History  Problem Relation Age of Onset  . Pancreatic cancer Father   . Raynaud syndrome Father   . Cancer Father        pancreatic  . Rashes / Skin problems Daughter  possibly scleraderma  . Hip fracture Mother   . Mental illness Mother        attempted suicide at 8 yo   Family Status:  Family Status  Relation Name Status  . Father  Deceased at age 48  . Daughter  Alive  . Mother  Deceased at age 11  . Brother  Deceased       suicide    ROS:  Please see the history of present illness.  All other ROS reviewed and negative.     Physical Exam/Data:   Vitals:   07/31/17 1002 07/31/17 1017 07/31/17 1032 07/31/17 1103  BP: 99/87 (!) 148/74 (!) 154/69 (!) 151/73  Pulse: 74 82 85 86  Resp:      Temp:      TempSrc:      SpO2: 97% 97% 93% 93%  Weight:      Height:       No intake or output data in the 24 hours ending 07/31/17 1459 Filed Weights   07/31/17 0610  Weight: 111 lb (50.3 kg)   Body mass index is 20.3 kg/m.  General:  Well nourished, well developed, in no acute distress HEENT: normal Lymph: no adenopathy Neck: no JVD Endocrine:  No thryomegaly Vascular: R>L carotid bruits, ?rad SEM; 4/4 extremity pulses 2+, without bruits  Cardiac:  normal S1, S2; RRR; 3/6 murmur  Lungs:  clear to auscultation bilaterally, no wheezing, rhonchi or rales  Abd: soft, nontender, no hepatomegaly  Ext: no edema, skin on hands is thickened, fingers are blunted Musculoskeletal:  No deformities, BUE and BLE strength normal and equal Skin: warm and dry  Neuro:  CNs 2-12 intact, no focal abnormalities  noted Psych:  Normal affect   EKG:  The EKG was personally reviewed and demonstrates:  SR, HR 75, normal intervals, no ischemic changes Telemetry:  Telemetry was personally reviewed and demonstrates:  n/a  Relevant CV Studies:  Carotid Dopplers: 05/27/2011 No significant extracranial carotid artery stenosis demonstrated.  Vertebrals are patent with antegrade flow. Mild ECA stenosis noted on the right and severe stenosis on the left.  Duplex imaging of the brachial artery demonstrates triphasic waveforms bilaterally.  Laboratory Data:  Chemistry Recent Labs  Lab 07/31/17 0550  NA 139  K 4.4  CL 106  CO2 22  GLUCOSE 105*  BUN 13  CREATININE 0.68  CALCIUM 8.9  GFRNONAA >60  GFRAA >60  ANIONGAP 11    Lab Results  Component Value Date   ALT 9 (L) 07/31/2017   AST 18 07/31/2017   ALKPHOS 79 07/31/2017   BILITOT 0.3 07/31/2017   Hematology Recent Labs  Lab 07/31/17 0550  WBC 9.9  RBC 3.93  HGB 10.4*  HCT 33.3*  MCV 84.7  MCH 26.5  MCHC 31.2  RDW 15.9*  PLT 421*   Cardiac EnzymesNo results for input(s): TROPONINI in the last 168 hours. No results for input(s): TROPIPOC in the last 168 hours.    TSH:  Lab Results  Component Value Date   TSH 1.100 04/02/2017   Lipids: Lab Results  Component Value Date   CHOL 202 (H) 04/02/2017   HDL 52 04/02/2017   LDLCALC 128 (H) 04/02/2017   TRIG 112 04/02/2017   CHOLHDL 3.9 04/02/2017    Radiology/Studies:  No results found.  Assessment and Plan:    Active Problems: 1.  Chest pain, atypical - no hx exertional sx, although she does have some DOE - sx resolved with burping, have not returned since she  ate. - she was very hungry, did not eat much yesterday due to anxiety about the procedure and had been NPO after midnight.  - no ongoing GERD sx   2. Carotid bruits - hx ECA stenosis 2012 - no TIA or stroke sx - MD advise on repeat Dopplers and f/u in office  3. SEM - No CHF by exam  - however, does  have some DOE - MD advise on ordering echo   For questions or updates, please contact [place] Please consult [email or web address] for contact info under Cardiology/STEMI.   Signed, Rosaria Ferries, PA-C  07/31/2017 2:59 PM  History and all data above reviewed.  Patient examined.  I agree with the findings as above.  The patient had chest pain that was somewhat atypical.  No prior cardiac history.  She has otherwise been active despite her orthopedic problems.  She denies any cardiovascular symptoms.  The patient denies any new symptoms such as chest discomfort, neck or arm discomfort. There has been no new shortness of breath, PND or orthopnea. There have been no reported palpitations, presyncope or syncope.   The patient exam reveals COR:RRR, systolic murmur, somewhat holosystolic, no diastolic murmurs  ,  Lungs: Clear  ,  Abd: Positive bowel sounds, no rebound no guarding, Ext No edema  .  All available labs, radiology testing, previous records reviewed. Agree with documented assessment and plan.   Chest pain:  Very atypical.  No further cardiac work up.  Murmur:  Needs echo and she want this to be at [facility].  I will follow up as needed.  Carotid bruit:  Bruit vs murmur but no plaque.  No further work up is indicated.    Jeneen Rinks Raiza Kiesel  3:32 PM  07/31/2017

## 2017-07-31 NOTE — Progress Notes (Addendum)
Dr. Estanislado Pandy notified of patient concern of feeling of indigestion or concern of possible drug allergy.  Dr. Estanislado Pandy stated to notify Jannifer Franklin.  Jannifer Franklin notified and came to bedside.

## 2017-07-31 NOTE — Progress Notes (Signed)
Patient currently resting in bed with her husband at her side.  Patient reports no pain or discomfort of any sort at this time.

## 2017-07-31 NOTE — Progress Notes (Signed)
Referring Physician(s): Dr Cordella Register  Supervising Physician: Luanne Bras  Patient Status:  [facility] OP  Chief Complaint:  Bilateral Sacroplasty in IR today  Subjective:  Pt states she feels a tightness in chest Not unusual for her to have this sensation after a procedure (especially with pain meds) Feels like indigestion She states she has often been given Benadryl - which she reports does help She reports she did take am meds with small water this am---felt it was" stuck " This sensation comes and goes At one minute it is easing; next minute pain is back and worsening  Denies N/V; denies diaphoresis Denies radiation to anywhere but chest front Denies SOB  VSS are WNL  Allergies: Codeine; Dilaudid [hydromorphone hcl]; Hydromorphone; Acyclovir and related; Gabapentin; Aleve [naproxen sodium]; and Lyrica [pregabalin]  Medications: Prior to Admission medications   Medication Sig Start Date End Date Taking? Authorizing Provider  clonazePAM (KLONOPIN) 0.5 MG tablet TAKE 1 TABLET TWICE DAILY AS NEEDED FOR ANXIETY 04/02/17  Yes Hassell Done, Mary-Margaret, FNP  docusate sodium (COLACE) 100 MG capsule Take 100 mg by mouth daily.    Yes [provider]  Ferrous Sulfate Dried (SLOW RELEASE IRON) 45 MG TBCR Take 1 tablet by mouth daily.   Yes [provider]  FLUoxetine (PROZAC) 40 MG capsule TAKE (1) CAPSULE DAILY 01/06/17  Yes Hassell Done, Mary-Margaret, FNP  ibuprofen (ADVIL,MOTRIN) 200 MG tablet Take 600 mg by mouth every 8 (eight) hours as needed for mild pain or moderate pain.   Yes [provider]  NIFEdipine (PROCARDIA XL/ADALAT-CC) 90 MG 24 hr tablet Take 1 tablet (90 mg total) by mouth daily. 01/06/17  Yes Martin, Mary-Margaret, FNP  omeprazole (PRILOSEC) 20 MG capsule Take 1 capsule (20 mg total) by mouth daily. 01/06/17  Yes Hassell Done, Mary-Margaret, FNP  Probiotic Product ([place]) Take 1 capsule by mouth daily.    Yes [provider]  sildenafil (REVATIO) 20 MG tablet Take 20 mg by mouth 3 (three) times daily.   Yes [provider]  SYNTHROID 75 MCG tablet Take 1 tablet (75 mcg total) by mouth daily before breakfast. 04/02/17  Yes Hassell Done, Mary-Margaret, FNP     Vital Signs: BP 133/77   Pulse 81   Temp 98.3 F (36.8 C) (Oral)   Resp 14   Ht 5\' 2"  (1.575 m)   Wt 111 lb (50.3 kg)   SpO2 94%   BMI 20.30 kg/m   Physical Exam  Constitutional: She is oriented to person, place, and time.  Cardiovascular: Normal rate, regular rhythm and normal heart sounds.  Pulmonary/Chest: Effort normal and breath sounds normal.  Abdominal: Soft. Bowel sounds are normal.  Neurological: She is alert and oriented to person, place, and time.  Skin: Skin is warm and dry.  Psychiatric: She has a normal mood and affect. Her behavior is normal.  Nursing note and vitals reviewed.   Imaging: Ir Radiologist Eval & Mgmt  Result Date: 07/28/2017 EXAM: NEW PATIENT OFFICE VISIT CHIEF COMPLAINT: Severe lumbosacral pain secondary to sacral insufficiency fractures. Current Pain Level: 1-10 HISTORY OF PRESENT ILLNESS: The patient is a 77 year old right-handed lady who has been referred for evaluation and management of severe lumbosacral pain related to sacral fractures bilaterally. The patient is accompanied by her husband. Basically, the patient reports acute onset of symptoms approximately 3 months ago after her fall on her buttocks on concrete. The pain was almost immediate with intense severity. At that time, the pain also radiated down her  right buttock over the outer lateral aspect of her right thigh and the right calf and into the right side of her foot. However, that part of her symptomatology has significantly improved. Her pain at the present time is constant 6 out of 10 being worse after prolonged sitting whereby the patient is unable to stand up without the use of support. She ambulates with a cane or a walker at home. She is unable  to perform her daily activities such as cleaning or stooping over. She has noted she has difficulty driving with prolonged sitting. Her appetite has been decreased since her hip surgery. She, at the present time, uses supplements or protein. Her weight is, otherwise, mostly stable. She denies autonomic dysfunction of her bowel or bladder activities. She also denies symptoms of UTI such as dysuria, hematuria or polyuria. She denies fever, chills or rigors. She remains constipated on account of her pain and also pain medications. Otherwise, no diarrhea, no blood in her stool. She denies any chest pain, shortness of breath or of paroxysmal nocturnal dyspnea. She denies recent cough, hemoptysis or wheezing. Past Medical History: Arthritis, scleroderma, Raynaud's phenomenon, melanoma in the distant past, depression, high blood pressure, reflux indigestion and thyroid disease. Past Surgical History: Right hip surgery, hysterectomy and melanoma removal in the distant past. She has also had some form of hand surgery. Medications: Clonazepam, Colace for constipation, ferrous sulfate, Prozac for depression, nifedipine for Raynaud's, omeprazole, probiotics, Revatio, Synthroid. Allergies: Codeine causes anaphylaxis. Dilaudid and hydromorphone also causes anaphylaxis. Acyclovir - patient unable to recollect, however, symptoms with acyclovir. Gabapentin, Lyrica and Aleve cause generalized feet and leg swelling. Social History: Patient is married lives with her husband. Has two children. She denies drinking alcohol or using illicit chemicals, or smoking tobacco. Family History: Mother deceased age 65 of sepsis. Father deceased age 59 of pancreatic cancer. Son deceased age 59 suicide. REVIEW OF SYSTEMS: Negative unless as mentioned above. PHYSICAL EXAMINATION: Appears to be in an moderate distress on account of the pain in her low back and buttock region while sitting down. Affect appropriate to the situation. Neurologically,  otherwise, no gross lateralizing abnormalities involving the cranial nerves, motor, sensory, coordination or station and gait. ASSESSMENT AND PLAN: The patient's recent MRI scan of the lumbosacral spine was reviewed with her and her husband. Brought to their attention was the abnormal signal in the sacral alae bilaterally, and also the right transverse process. The former probably represented sacral fractures, subacute in nature, with the increased signal in the right transverse process possibly representing reactive contusion. Degenerative disc disease probably degenerative changes at L4-5 and L2-L3. Dextrocurvature of her lumbar spine. The option of augmentation of the alar regions with methylmethacrylate mixture to prevent worsening of her sacral fractures, and also to alleviate pain was discussed with the patient and the patient's husband. The procedure, the reasons, and the alternatives were all reviewed with her. Questions were answered to their satisfaction. The patient and her husband are in agreement to proceed with sacral S1 methylmethacrylate augmentation. This will be scheduled as soon as possible. In the meantime, she has been advised to continue using either a cane or walker to ambulate, and also to ensure she avoid stooping, bending or lifting weights above 10 pounds. They both leave with good understanding and agreement with the above management plan. Electronically Signed   By: Luanne Bras M.D.   On: 07/27/2017 14:08    Labs:  CBC: Recent Labs    04/29/17 1620 07/31/17 0550  WBC 9.1 9.9  HGB 11.4 10.4*  HCT 31.2* 33.3*  PLT 343 421*    COAGS: Recent Labs    07/31/17 0550  INR 0.99  APTT 36    BMP: Recent Labs    01/06/17 1459 04/02/17 1112 04/29/17 1620 07/31/17 0550  NA 140 140 138 139  K 4.0 4.5 4.1 4.4  CL 105 102 101 106  CO2 21 25 22 22   GLUCOSE 99 83 105* 105*  BUN 19 9 9 13   CALCIUM 9.2 9.1 8.9 8.9  CREATININE 0.77 0.84 0.74 0.68  GFRNONAA 76 68  80 >60  GFRAA 87 79 92 >60    LIVER FUNCTION TESTS: Recent Labs    01/06/17 1459 04/02/17 1112 04/29/17 1620 07/31/17 0550  BILITOT <0.2 <0.2 <0.2 0.3  AST 16 16 25 18   ALT 11 9 34* 9*  ALKPHOS 57 70 78 79  PROT 6.6 7.0 7.0 6.8  ALBUMIN 4.2 4.2 4.0 3.6    Assessment and Plan:  Chest tightness post Sacroplasty in IR today No other symptoms ECG WNL Discussed with Dr Estanislado Pandy-- will call Cardiology for opinion-- Cardio to evaluate asap  Electronically Signed: Toby Ayad A, PA-C 07/31/2017, 10:04 AM   I spent a total of 25 Minutes at the the patient's bedside AND on the patient's hospital floor or unit, greater than 50% of which was counseling/coordinating care for sacroplasty

## 2017-07-31 NOTE — Progress Notes (Signed)
Patient has not complaints of pain or discomfort.  Denies Nausea, chest pressure and other symptoms.  Patients states she feels perfectly normal and has no back pain from the procedure.  Patient is getting dressed currently but understands we are leaving IV in right forearm until discharged after cardiology.consult.  Jannifer Franklin PA contacted concerning patients current condition.  Trish with cardiology contacted.

## 2017-07-31 NOTE — Sedation Documentation (Signed)
remedicated

## 2017-08-03 ENCOUNTER — Encounter (HOSPITAL_COMMUNITY): Payer: Self-pay | Admitting: Interventional Radiology

## 2017-08-04 NOTE — Progress Notes (Signed)
Thanks,  Jim

## 2017-08-25 ENCOUNTER — Ambulatory Visit (HOSPITAL_COMMUNITY)
Admission: RE | Admit: 2017-08-25 | Discharge: 2017-08-25 | Disposition: A | Discharge status: Home / Self Care | Payer: Medicare Other | Source: Ambulatory Visit | Attending: Physician Assistant | Admitting: Physician Assistant

## 2017-08-25 DIAGNOSIS — I350 Nonrheumatic aortic (valve) stenosis: Secondary | ICD-10-CM | POA: Diagnosis not present

## 2017-08-25 DIAGNOSIS — I10 Essential (primary) hypertension: Secondary | ICD-10-CM | POA: Diagnosis not present

## 2017-08-25 DIAGNOSIS — E039 Hypothyroidism, unspecified: Secondary | ICD-10-CM | POA: Insufficient documentation

## 2017-08-25 DIAGNOSIS — E785 Hyperlipidemia, unspecified: Secondary | ICD-10-CM | POA: Insufficient documentation

## 2017-08-25 DIAGNOSIS — I7300 Raynaud's syndrome without gangrene: Secondary | ICD-10-CM | POA: Insufficient documentation

## 2017-08-25 DIAGNOSIS — R0609 Other forms of dyspnea: Secondary | ICD-10-CM | POA: Insufficient documentation

## 2017-08-25 DIAGNOSIS — M349 Systemic sclerosis, unspecified: Secondary | ICD-10-CM | POA: Insufficient documentation

## 2017-08-25 DIAGNOSIS — K219 Gastro-esophageal reflux disease without esophagitis: Secondary | ICD-10-CM | POA: Diagnosis not present

## 2017-08-25 LAB — ECHOCARDIOGRAM COMPLETE
AO mean calculated velocity dopler: 154 cm/s
AOPV: 0.51 m/s
AOVTI: 50.4 cm
AV Area VTI index: 1.21 cm2/m2
AV Area VTI: 1.61 cm2
AV Area mean vel: 1.81 cm2
AV VEL mean LVOT/AV: 0.58
AV area mean vel ind: 1.21 cm2/m2
AV peak Index: 1.08
AVA: 1.8 cm2
AVG: 11 mmHg
AVPG: 26 mmHg
AVPKVEL: 257 cm/s
CHL CUP AV VEL: 1.8
CHL CUP DOP CALC LVOT VTI: 28.9 cm
CHL CUP MV DEC (S): 225
E decel time: 225 msec
E/e' ratio: 16.99
FS: 58 % — AB (ref 28–44)
IV/PV OW: 0.88
LA ID, A-P, ES: 39 mm
LA diam end sys: 39 mm
LADIAMINDEX: 2.62 cm/m2
LAVOL: 34.1 mL
LAVOLA4C: 23.1 mL
LAVOLIN: 22.9 mL/m2
LDCA: 3.14 cm2
LV e' LATERAL: 7.83 cm/s
LVEEAVG: 16.99
LVEEMED: 16.99
LVOT diameter: 20 mm
LVOT peak VTI: 0.57 cm
LVOT peak grad rest: 7 mmHg
LVOT peak vel: 132 cm/s
LVOTSV: 91 mL
MV Peak grad: 7 mmHg
MV pk A vel: 144 m/s
MVPKEVEL: 133 m/s
PW: 9.89 mm — AB (ref 0.6–1.1)
RV LATERAL S' VELOCITY: 14.7 cm/s
RV sys press: 34 mmHg
Reg peak vel: 278 cm/s
TAPSE: 24.7 mm
TDI e' lateral: 7.83
TDI e' medial: 5.22
TR max vel: 278 cm/s
Valve area index: 1.21

## 2017-08-25 NOTE — Progress Notes (Signed)
*  PRELIMINARY RESULTS* Echocardiogram 2D Echocardiogram has been performed.  Leavy Cella 08/25/2017, 1:04 PM

## 2017-09-05 ENCOUNTER — Ambulatory Visit (INDEPENDENT_AMBULATORY_CARE_PROVIDER_SITE_OTHER): Payer: Medicare Other | Admitting: Family Medicine

## 2017-09-05 ENCOUNTER — Encounter: Payer: Self-pay | Admitting: Family Medicine

## 2017-09-05 VITALS — BP 152/67 | HR 64 | Temp 98.8°F | Ht 62.0 in | Wt 113.4 lb

## 2017-09-05 DIAGNOSIS — J0110 Acute frontal sinusitis, unspecified: Secondary | ICD-10-CM

## 2017-09-05 DIAGNOSIS — R6889 Other general symptoms and signs: Secondary | ICD-10-CM

## 2017-09-05 LAB — VERITOR FLU A/B WAIVED
Influenza A: NEGATIVE
Influenza B: NEGATIVE

## 2017-09-05 MED ORDER — AMOXICILLIN-POT CLAVULANATE 875-125 MG PO TABS: 1.0000 | ORAL_TABLET | Freq: Two times a day (BID) | ORAL | 0 refills | Status: DC

## 2017-09-05 MED ORDER — BENZONATATE 200 MG PO CAPS: 200.0000 mg | ORAL_CAPSULE | Freq: Two times a day (BID) | ORAL | 0 refills | Status: DC | PRN

## 2017-09-05 NOTE — Progress Notes (Signed)
Subjective: CC:flu like symptoms PCP: [name], FNP QTM:AUQJFH C Prajapati is a 77 y.o. female presenting to clinic today for:  1. Flu like symptoms Patient notes that she developed flulike symptoms which included cough, chills, headache, sinus congestion, facial pain on Monday.  She notes that her husband is sick with similar.  She has had fevers to 100.2 F.  She notes that he was actually evaluated in the [place] and thought to have a bronchitis and was prescribed antibiotics and cough medication.  She denies nausea, vomiting, diarrhea.  She reports that she is only been using ibuprofen for discomfort and fever.  She was reluctant to use any of the over-the-counter medications given her history of high blood pressure and intolerance to many of the ingredients and OTC cough and cold medications.  She does feel that she becomes short of breath with exertion since being ill.  Denies hemoptysis, productive cough.  Symptoms tend to be worse at nighttime.  ROS: Per HPI  Allergies  Allergen Reactions  . Codeine Shortness Of Breath    REACTION: dyspnea  . Dilaudid [Hydromorphone Hcl] Anaphylaxis  . Hydromorphone Anaphylaxis  . Acyclovir And Related   . Gabapentin Swelling    Swelling in feet, and in legs  . Aleve [Naproxen Sodium] Rash  . Lyrica [Pregabalin] Other (See Comments)    Swelling in feet, and in legs   Past Medical History:  Diagnosis Date  . Cataract   . Dyspnea   . Osteoarthritis   . Raynaud disease   . Scleroderma ([place])   . Thyroid disease    hypothyroidism    Current Outpatient Medications:  .  clonazePAM (KLONOPIN) 0.5 MG tablet, TAKE 1 TABLET TWICE DAILY AS NEEDED FOR ANXIETY, Disp: 60 tablet, Rfl: 2 .  FLUoxetine (PROZAC) 40 MG capsule, TAKE (1) CAPSULE DAILY, Disp: 90 capsule, Rfl: 1 .  ibuprofen (ADVIL,MOTRIN) 200 MG tablet, Take 600 mg by mouth every 8 (eight) hours as needed for mild pain or moderate pain., Disp: , Rfl:  .  NIFEdipine (PROCARDIA  XL/ADALAT-CC) 90 MG 24 hr tablet, Take 1 tablet (90 mg total) by mouth daily., Disp: 90 tablet, Rfl: 1 .  omeprazole (PRILOSEC) 20 MG capsule, Take 1 capsule (20 mg total) by mouth daily., Disp: 90 capsule, Rfl: 1 .  sildenafil (REVATIO) 20 MG tablet, Take 20 mg by mouth 3 (three) times daily., Disp: , Rfl:  .  SYNTHROID 75 MCG tablet, Take 1 tablet (75 mcg total) by mouth daily before breakfast., Disp: 30 tablet, Rfl: 5 .  amoxicillin-clavulanate (AUGMENTIN) 875-125 MG tablet, Take 1 tablet by mouth 2 (two) times daily., Disp: 20 tablet, Rfl: 0 .  benzonatate (TESSALON) 200 MG capsule, Take 1 capsule (200 mg total) by mouth 2 (two) times daily as needed for cough., Disp: 20 capsule, Rfl: 0 Social History   Socioeconomic History  . Marital status: Married    Spouse name: Not on file  . Number of children: Not on file  . Years of education: Not on file  . Highest education level: Not on file  Social Needs  . Financial resource strain: Not on file  . Food insecurity - worry: Not on file  . Food insecurity - inability: Not on file  . Transportation needs - medical: Not on file  . Transportation needs - non-medical: Not on file  Occupational History  . Occupation: retired Occupational psychologist at [employer]  Tobacco Use  . Smoking status: Never Smoker  . Smokeless tobacco: Never Used  .  Tobacco comment: passive tobacco smoke exposure as child  Substance and Sexual Activity  . Alcohol use: No  . Drug use: No  . Sexual activity: Yes  Other Topics Concern  . Not on file  Social History Narrative   Regular exercise: walking   Caffeine use: 1 cup of coffee daily   Family History  Problem Relation Age of Onset  . Pancreatic cancer Father   . Raynaud syndrome Father   . Cancer Father        pancreatic  . Rashes / Skin problems Daughter        possibly scleraderma  . Hip fracture Mother   . Mental illness Mother        attempted suicide at 57 yo    Objective: Office vital signs  reviewed. BP (!) 152/67   Pulse 64   Temp 98.8 F (37.1 C) (Oral)   Ht 5\' 2"  (1.575 m)   Wt 113 lb 6.4 oz (51.4 kg)   BMI 20.74 kg/m   Physical Examination:  General: Awake, alert, well nourished, nontoxic, No acute distress HEENT: +frontal sinus TTP    Neck: No masses palpated. No lymphadenopathy    Ears: Tympanic membranes intact, normal light reflex, no erythema, no bulging    Eyes: PERRLA, extraocular membranes intact, sclera white    Nose: nasal turbinates moist, mucosa is edematous and erythematous with clear nasal discharge    Throat: moist mucus membranes, no erythema, no tonsillar exudate.  Airway is patent Cardio: regular rate and rhythm, S1S2 heard, no murmurs appreciated Pulm: clear to auscultation bilaterally, no wheezes, rhonchi or rales; normal work of breathing on room air   Assessment/ Plan: 77 y.o. female   1. Acute non-recurrent frontal sinusitis Patient is afebrile and nontoxic-appearing.  Her blood pressure is slightly elevated during today's office exam.  This may be because of use of ibuprofen.  Given the severity of her symptoms, will treat her as an acute bacterial sinusitis with Augmentin p.o. twice daily for the next 10 days.  Tessalon Perles prescribed for cough.  Home care instructions reviewed with patient and handout was provided.  She will follow-up as needed.  2. Flu-like symptoms Rapid flu was negative. - Veritor Flu A/B Waived   Orders Placed This Encounter  Procedures  . Veritor Flu A/B Waived    Order Specific Question:   Source    Answer:   nasal   Meds ordered this encounter  Medications  . amoxicillin-clavulanate (AUGMENTIN) 875-125 MG tablet    Sig: Take 1 tablet by mouth 2 (two) times daily.    Dispense:  20 tablet    Refill:  0  . benzonatate (TESSALON) 200 MG capsule    Sig: Take 1 capsule (200 mg total) by mouth 2 (two) times daily as needed for cough.    Dispense:  20 capsule    Refill:  [place],  DO [place] [phone]

## 2017-09-05 NOTE — Patient Instructions (Signed)
I recommend that you only use cold medications that are safe in high blood pressure like Coricidin (generic is fine).  Other cold medications can increase your blood pressure.    - Get plenty of rest and drink plenty of fluids. - Try to breathe moist air. Use a cold mist humidifier. - Consume warm fluids (soup or tea) to provide relief for a stuffy nose and to loosen phlegm. - For nasal stuffiness, try saline nasal spray or a Neti Pot.  Afrin nasal spray can also be used but this product should not be used longer than 3 days or it will cause rebound nasal stuffiness (worsening nasal congestion). - For sore throat pain relief: suck on throat lozenges, hard candy or popsicles; gargle with warm salt water (1/4 tsp. salt per 8 oz. of water); and eat soft, bland foods. - Eat a well-balanced diet. If you cannot, ensure you are getting enough nutrients by taking a daily multivitamin. - Avoid dairy products, as they can thicken phlegm. - Avoid alcohol, as it impairs your body's immune system.  Sinusitis, Adult Sinusitis is soreness and inflammation of your sinuses. Sinuses are hollow spaces in the bones around your face. They are located:  Around your eyes.  In the middle of your forehead.  Behind your nose.  In your cheekbones.  Your sinuses and nasal passages are lined with a stringy fluid (mucus). Mucus normally drains out of your sinuses. When your nasal tissues get inflamed or swollen, the mucus can get trapped or blocked so air cannot flow through your sinuses. This lets bacteria, viruses, and funguses grow, and that leads to infection. Follow these instructions at home: Medicines  Take, use, or apply over-the-counter and prescription medicines only as told by your doctor. These may include nasal sprays.  If you were prescribed an antibiotic medicine, take it as told by your doctor. Do not stop taking the antibiotic even if you start to feel better. Hydrate and Humidify  Drink enough  water to keep your pee (urine) clear or pale yellow.  Use a cool mist humidifier to keep the humidity level in your home above 50%.  Breathe in steam for 10-15 minutes, 3-4 times a day or as told by your doctor. You can do this in the bathroom while a hot shower is running.  Try not to spend time in cool or dry air. Rest  Rest as much as possible.  Sleep with your head raised (elevated).  Make sure to get enough sleep each night. General instructions  Put a warm, moist washcloth on your face 3-4 times a day or as told by your doctor. This will help with discomfort.  Wash your hands often with soap and water. If there is no soap and water, use hand sanitizer.  Do not smoke. Avoid being around people who are smoking (secondhand smoke).  Keep all follow-up visits as told by your doctor. This is important. Contact a doctor if:  You have a fever.  Your symptoms get worse.  Your symptoms do not get better within 10 days. Get help right away if:  You have a very bad headache.  You cannot stop throwing up (vomiting).  You have pain or swelling around your face or eyes.  You have trouble seeing.  You feel confused.  Your neck is stiff.  You have trouble breathing. This information is not intended to replace advice given to you by your health care provider. Make sure you discuss any questions you have with your  health care provider. Document Released: 12/10/2007 Document Revised: 02/17/2016 Document Reviewed: 04/18/2015 Elsevier Interactive Patient Education  [employer].

## 2017-09-08 ENCOUNTER — Telehealth: Payer: Self-pay | Admitting: Nurse Practitioner

## 2017-09-08 MED ORDER — FLUCONAZOLE 150 MG PO TABS: 150.0000 mg | ORAL_TABLET | Freq: Once | ORAL | 0 refills | Status: AC

## 2017-09-08 NOTE — Telephone Encounter (Signed)
Advise on medication. 

## 2017-09-08 NOTE — Telephone Encounter (Signed)
Diflucan sent to pharmacy.

## 2017-09-09 NOTE — Telephone Encounter (Signed)
Patient aware.

## 2017-09-16 ENCOUNTER — Telehealth: Payer: Self-pay | Admitting: Cardiology

## 2017-09-16 NOTE — Telephone Encounter (Signed)
Closed Encounter  °

## 2017-09-24 ENCOUNTER — Other Ambulatory Visit: Payer: Self-pay | Admitting: Nurse Practitioner

## 2017-09-24 DIAGNOSIS — K219 Gastro-esophageal reflux disease without esophagitis: Secondary | ICD-10-CM

## 2017-10-01 ENCOUNTER — Ambulatory Visit (INDEPENDENT_AMBULATORY_CARE_PROVIDER_SITE_OTHER): Payer: Medicare Other | Admitting: Nurse Practitioner

## 2017-10-01 ENCOUNTER — Encounter: Payer: Self-pay | Admitting: Nurse Practitioner

## 2017-10-01 VITALS — BP 126/59 | HR 72 | Temp 98.1°F | Ht 62.0 in | Wt 117.0 lb

## 2017-10-01 DIAGNOSIS — E785 Hyperlipidemia, unspecified: Secondary | ICD-10-CM | POA: Diagnosis not present

## 2017-10-01 DIAGNOSIS — F3342 Major depressive disorder, recurrent, in full remission: Secondary | ICD-10-CM | POA: Diagnosis not present

## 2017-10-01 DIAGNOSIS — I7301 Raynaud's syndrome with gangrene: Secondary | ICD-10-CM | POA: Diagnosis not present

## 2017-10-01 DIAGNOSIS — M810 Age-related osteoporosis without current pathological fracture: Secondary | ICD-10-CM

## 2017-10-01 DIAGNOSIS — M79646 Pain in unspecified finger(s): Secondary | ICD-10-CM | POA: Diagnosis not present

## 2017-10-01 DIAGNOSIS — M159 Polyosteoarthritis, unspecified: Secondary | ICD-10-CM | POA: Diagnosis not present

## 2017-10-01 DIAGNOSIS — I10 Essential (primary) hypertension: Secondary | ICD-10-CM | POA: Diagnosis not present

## 2017-10-01 DIAGNOSIS — M349 Systemic sclerosis, unspecified: Secondary | ICD-10-CM | POA: Diagnosis not present

## 2017-10-01 DIAGNOSIS — K219 Gastro-esophageal reflux disease without esophagitis: Secondary | ICD-10-CM

## 2017-10-01 DIAGNOSIS — R6889 Other general symptoms and signs: Secondary | ICD-10-CM | POA: Diagnosis not present

## 2017-10-01 DIAGNOSIS — E039 Hypothyroidism, unspecified: Secondary | ICD-10-CM | POA: Diagnosis not present

## 2017-10-01 MED ORDER — LISINOPRIL 20 MG PO TABS
20.0000 mg | ORAL_TABLET | Freq: Every day | ORAL | 3 refills | Status: DC
Start: 2017-10-01 — End: 2017-10-01

## 2017-10-01 MED ORDER — CLONAZEPAM 0.5 MG PO TABS: ORAL_TABLET | ORAL | 2 refills | Status: DC

## 2017-10-01 MED ORDER — AMLODIPINE BESYLATE 5 MG PO TABS: 5.0000 mg | ORAL_TABLET | Freq: Every day | ORAL | 1 refills | Status: DC

## 2017-10-01 MED ORDER — FLUOXETINE HCL 40 MG PO CAPS: ORAL_CAPSULE | ORAL | 1 refills | Status: DC

## 2017-10-01 NOTE — Addendum Note (Signed)
Addended by: [name] on: 10/01/2017 03:00 PM   Modules accepted: Orders

## 2017-10-01 NOTE — Patient Instructions (Signed)
Raynaud Phenomenon Raynaud phenomenon is a condition that affects the blood vessels (arteries) that carry blood to your fingers and toes. The arteries that supply blood to your ears or the tip of your nose might also be affected. Raynaud phenomenon causes the arteries to temporarily narrow. As a result, the flow of blood to the affected areas is temporarily decreased. This usually occurs in response to cold temperatures or stress. During an attack, the skin in the affected areas turns white. You may also feel tingling or numbness in those areas. Attacks usually last for only a brief period, and then the blood flow to the area returns to normal. In most cases, Raynaud phenomenon does not cause serious health problems. What are the causes? For many people with this condition, the cause is not known. Raynaud phenomenon is sometimes associated with other diseases, such as scleroderma or lupus. What increases the risk? Raynaud phenomenon can affect anyone, but it develops most often in people who are 20-40 years old. It affects more females than males. What are the signs or symptoms? Symptoms of Raynaud phenomenon may occur when you are exposed to cold temperatures or when you have emotional stress. The symptoms may last for a few minutes or up to several hours. They usually affect your fingers but may also affect your toes, ears, or the tip of your nose. Symptoms may include:  Changes in skin color. The skin in the affected areas will turn pale or white. The skin may then change from white to bluish to red as normal blood flow returns to the area.  Numbness, tingling, or pain in the affected areas.  In severe cases, sores may develop in the affected areas. How is this diagnosed? Your health care provider will do a physical exam and take your medical history. You may be asked to put your hands in cold water to check for a reaction to cold temperature. Blood tests may be done to check for other diseases or  conditions. Your health care provider may also order a test to check the movement of blood through your arteries and veins (vascular ultrasound). How is this treated? Treatment often involves making lifestyle changes and taking steps to control your exposure to cold temperatures. For more severe cases, medicine (calcium channel blockers) may be used to improve blood flow. Surgery is sometimes done to block the nerves that control the affected arteries, but this is rare. Follow these instructions at home:  Avoid exposure to cold by taking these steps: ? If possible, stay indoors during cold weather. ? When you go outside during cold weather, dress in layers and wear mittens, a hat, a scarf, and warm footwear. ? Wear mittens or gloves when handling ice or frozen food. ? Use holders for glasses or cans containing cold drinks. ? Let warm water run for a while before taking a shower or bath. ? Warm up the car before driving in cold weather.  If possible, avoid stressful and emotional situations. Exercise, meditation, and yoga may help you cope with stress. Biofeedback may be useful.  Do not use any tobacco products, including cigarettes, chewing tobacco, or electronic cigarettes. If you need help quitting, ask your health care provider.  Avoid secondhand smoke.  Limit your use of caffeine. Switch to decaffeinated coffee, tea, and soda. Avoid chocolate.  Wear loose fitting socks and comfortable, roomy shoes.  Avoid vibrating tools and machinery.  Take medicines only as directed by your health care provider. Contact a health care provider if:    Your discomfort becomes worse despite lifestyle changes.  You develop sores on your fingers or toes that do not heal.  Your fingers or toes turn black.  You have breaks in the skin on your fingers or toes.  You have a fever.  You have pain or swelling in your joints.  You have a rash.  Your symptoms occur on only one side of your body. This  information is not intended to replace advice given to you by your health care provider. Make sure you discuss any questions you have with your health care provider. Document Released: 06/20/2000 Document Revised: 11/29/2015 Document Reviewed: 12/26/2015 Elsevier Interactive Patient Education  2017 [employer]vier Inc.  

## 2017-10-01 NOTE — Progress Notes (Signed)
Subjective:    Patient ID: Haley Trevino, female    DOB: 12-Aug-1940, 77 y.o.   MRN: [id number]  HPI  Haley Trevino is here today for follow up of chronic medical problem.  Outpatient Encounter Medications as of 10/01/2017  Medication Sig  . amoxicillin-clavulanate (AUGMENTIN) 875-125 MG tablet Take 1 tablet by mouth 2 (two) times daily.  . benzonatate (TESSALON) 200 MG capsule Take 1 capsule (200 mg total) by mouth 2 (two) times daily as needed for cough.  . clonazePAM (KLONOPIN) 0.5 MG tablet TAKE 1 TABLET TWICE DAILY AS NEEDED FOR ANXIETY  . FLUoxetine (PROZAC) 40 MG capsule TAKE (1) CAPSULE DAILY  . ibuprofen (ADVIL,MOTRIN) 200 MG tablet Take 600 mg by mouth every 8 (eight) hours as needed for mild pain or moderate pain.  Marland Kitchen NIFEdipine (PROCARDIA XL/ADALAT-CC) 90 MG 24 hr tablet Take 1 tablet (90 mg total) by mouth daily.  Marland Kitchen omeprazole (PRILOSEC) 20 MG capsule TAKE 1 TABLET DAILY  . sildenafil (REVATIO) 20 MG tablet Take 20 mg by mouth 3 (three) times daily.  Marland Kitchen SYNTHROID 75 MCG tablet Take 1 tablet (75 mcg total) by mouth daily before breakfast.     1. Essential hypertension  No c/o chest pain, sob or headache. Does not check blood pressure at home. BP Readings from Last 3 Encounters:  09/05/17 (!) 152/67  07/31/17 136/60  06/24/17 (!) 78/54     2. [name] with gangrene ([place])  Fingers stay cold all of the time. She carries hand warmers around t keep fingers warm. She currently has no lesions on fingers. She is on nifedipine and her insurance no longers wants to ay for it.  3. Gastroesophageal reflux disease without esophagitis  Takes omeprazole most days to keep symptoms under control  4. Hypothyroidism, unspecified type  No problems that aware of  5. Age-related osteoporosis without current pathological fracture  No c/o back pain today. With t score of -3.1. She is not on a bisphonates and does very little weight bearing exercise. She was having back pain  and she saw Dr, Kerin Perna and she had cracks in her coccyx and he sent her to Dr. Estil Daft. He cemented cracks in tail bone and she is doing much better.  6. Osteoarthritis of multiple joints, unspecified osteoarthritis type  Has scattered joint pains- sees Dr. Amil Amen  7. Recurrent major depressive disorder, in full remission ([place])  Is currently on prozac and she says works most of the time. She still has occasional depression, mainly due to her medical problems. Takes klonopin BID  8. Hyperlipidemia with target LDL less than 100  Does not really watch diet but she is also not a big eater either  9. SCLERODERMA  Sees dr. Amil Amen every 3 months    New complaints: She had some chest pain while in hospital having cement injected in tail bone and had to see cardiology- is going to have echo in 2 weeks Fatigue-  She stopped taking her iron supplement ecause she just forgot to take a few weeks so just stopped all together. Social history: Lives with husband- has a granddaughter that she is very close to.    Review of Systems  Constitutional: Negative for activity change and appetite change.  HENT: Negative.   Eyes: Negative for pain.  Respiratory: Negative for shortness of breath.   Cardiovascular: Negative for chest pain, palpitations and leg swelling.  Gastrointestinal: Negative for abdominal pain.  Endocrine: Negative for polydipsia.  Genitourinary: Negative.   Musculoskeletal:  Positive for arthralgias and back pain.  Skin: Negative for rash.  Neurological: Positive for dizziness and numbness. Negative for weakness and headaches.  Hematological: Does not bruise/bleed easily.  Psychiatric/Behavioral: Negative.   All other systems reviewed and are negative.      Objective:   Physical Exam  Constitutional: She is oriented to person, place, and time. She appears well-developed and well-nourished.  HENT:  Nose: Nose normal.  Mouth/Throat: Oropharynx is clear and moist.  Eyes: EOM  are normal.  Neck: Trachea normal, normal range of motion and full passive range of motion without pain. Neck supple. No JVD present. Carotid bruit is not present. No thyromegaly present.  Cardiovascular: Normal rate, regular rhythm, normal heart sounds and intact distal pulses. Exam reveals no gallop and no friction rub.  No murmur heard. Pulmonary/Chest: Effort normal and breath sounds normal.  Abdominal: Soft. Bowel sounds are normal. She exhibits no distension and no mass. There is no tenderness.  Musculoskeletal: Normal range of motion.  Lymphadenopathy:    She has no cervical adenopathy.  Neurological: She is alert and oriented to person, place, and time. She has normal reflexes.  Skin: Skin is warm and dry.  Fingers cold and slightly cyanotic  Psychiatric: She has a normal mood and affect. Her behavior is normal. Judgment and thought content normal.    BP (!) 126/59   Pulse 72   Temp 98.1 F (36.7 C) (Oral)   Ht 5\' 2"  (1.575 m)   Wt 117 lb (53.1 kg)   BMI 21.40 kg/m        Assessment & Plan:  1. Essential hypertension Low sodium diet Keep diary of blood pressure at home  2. [name] with gangrene ([place]) Keep hands warm Stopped nifedipine because eno longer preferred by insurance and is expensive. She understands that amlodipine may not work as well. - amLODipine (NORVASC) 5 MG tablet; Take 1 tablet (5 mg total) by mouth daily.  Dispense: 90 tablet; Refill: 1  3. Gastroesophageal reflux disease without esophagitis Avoid spicy foods Do not eat 2 hours prior to bedtime  4. Hypothyroidism, unspecified type  5. Age-related osteoporosis without current pathological fracture Weight bearing exercises as can tolerate  6. Osteoarthritis of multiple joints, unspecified osteoarthritis type  7. Recurrent major depressive disorder, in full remission ([place]) Stress management - FLUoxetine (PROZAC) 40 MG capsule; TAKE (1) CAPSULE DAILY  Dispense: 90 capsule; Refill:  1 - clonazePAM (KLONOPIN) 0.5 MG tablet; TAKE 1 TABLET TWICE DAILY AS NEEDED FOR ANXIETY  Dispense: 60 tablet; Refill: 2  8. Hyperlipidemia with target LDL less than 100 Low fat diet  9. SCLERODERMA Keep follow up with dr. Amil Amen    Labs pending Health maintenance reviewed Diet and exercise encouraged Continue all meds Follow up  In 3 months   [place], FNP

## 2017-10-02 LAB — CMP14+EGFR
ALBUMIN: 4.3 g/dL (ref 3.5–4.8)
ALT: 10 IU/L (ref 0–32)
AST: 15 IU/L (ref 0–40)
Albumin/Globulin Ratio: 1.5 (ref 1.2–2.2)
Alkaline Phosphatase: 67 IU/L (ref 39–117)
BUN / CREAT RATIO: 16 (ref 12–28)
BUN: 13 mg/dL (ref 8–27)
Bilirubin Total: <0.2 mg/dL (ref 0.0–1.2)
CO2: 22 mmol/L (ref 20–29)
CREATININE: 0.8 mg/dL (ref 0.57–1.00)
Calcium: 8.9 mg/dL (ref 8.7–10.3)
Chloride: 106 mmol/L (ref 96–106)
GFR calc non Af Amer: 72 mL/min/{1.73_m2} (ref >59)
GFR, EST AFRICAN AMERICAN: 83 mL/min/{1.73_m2} (ref >59)
GLOBULIN, TOTAL: 2.9 g/dL (ref 1.5–4.5)
GLUCOSE: 83 mg/dL (ref 65–99)
Potassium: 4.1 mmol/L (ref 3.5–5.2)
Sodium: 142 mmol/L (ref 134–144)
TOTAL PROTEIN: 7.2 g/dL (ref 6.0–8.5)

## 2017-10-02 LAB — ANEMIA PROFILE B
Basophils Absolute: 0 10*3/uL (ref 0.0–0.2)
Basos: 1 %
EOS (ABSOLUTE): 0.3 10*3/uL (ref 0.0–0.4)
EOS: 4 %
Ferritin: 7 ng/mL — ABNORMAL LOW (ref 15–150)
Folate: 7.1 ng/mL (ref >3.0)
HEMOGLOBIN: 8.9 g/dL — AB (ref 11.1–15.9)
Hematocrit: 25.5 % — ABNORMAL LOW (ref 34.0–46.6)
IMMATURE GRANS (ABS): 0 10*3/uL (ref 0.0–0.1)
IMMATURE GRANULOCYTES: 0 %
IRON SATURATION: 5 % — AB (ref 15–55)
Iron: 18 ug/dL — ABNORMAL LOW (ref 27–139)
LYMPHS: 26 %
Lymphocytes Absolute: 1.9 10*3/uL (ref 0.7–3.1)
MCH: 28.8 pg (ref 26.6–33.0)
MCHC: 34.9 g/dL (ref 31.5–35.7)
MCV: 83 fL (ref 79–97)
MONOCYTES: 9 %
MONOS ABS: 0.6 10*3/uL (ref 0.1–0.9)
NEUTROS PCT: 60 %
Neutrophils Absolute: 4.4 10*3/uL (ref 1.4–7.0)
Platelets: 463 10*3/uL — ABNORMAL HIGH (ref 150–379)
RBC: 3.09 x10E6/uL — ABNORMAL LOW (ref 3.77–5.28)
RDW: 15.4 % (ref 12.3–15.4)
Retic Ct Pct: 0.5 % — ABNORMAL LOW (ref 0.6–2.6)
TIBC: 382 ug/dL (ref 250–450)
UIBC: 364 ug/dL (ref 118–369)
Vitamin B-12: 357 pg/mL (ref 232–1245)
WBC: 7.3 10*3/uL (ref 3.4–10.8)

## 2017-10-02 LAB — LIPID PANEL
CHOL/HDL RATIO: 4.1 ratio (ref 0.0–4.4)
Cholesterol, Total: 232 mg/dL — ABNORMAL HIGH (ref 100–199)
HDL: 57 mg/dL (ref >39)
LDL CALC: 155 mg/dL — AB (ref 0–99)
Triglycerides: 99 mg/dL (ref 0–149)
VLDL CHOLESTEROL CAL: 20 mg/dL (ref 5–40)

## 2017-10-07 ENCOUNTER — Ambulatory Visit: Payer: Medicare Other | Admitting: Cardiology

## 2017-10-14 ENCOUNTER — Other Ambulatory Visit: Payer: Self-pay | Admitting: Nurse Practitioner

## 2017-10-14 DIAGNOSIS — E039 Hypothyroidism, unspecified: Secondary | ICD-10-CM

## 2017-11-03 DIAGNOSIS — M542 Cervicalgia: Secondary | ICD-10-CM | POA: Diagnosis not present

## 2017-11-03 DIAGNOSIS — I7300 Raynaud's syndrome without gangrene: Secondary | ICD-10-CM | POA: Diagnosis not present

## 2017-11-03 DIAGNOSIS — M340 Progressive systemic sclerosis: Secondary | ICD-10-CM | POA: Diagnosis not present

## 2017-11-03 DIAGNOSIS — M810 Age-related osteoporosis without current pathological fracture: Secondary | ICD-10-CM | POA: Diagnosis not present

## 2017-11-03 DIAGNOSIS — K219 Gastro-esophageal reflux disease without esophagitis: Secondary | ICD-10-CM | POA: Diagnosis not present

## 2017-11-03 DIAGNOSIS — L98499 Non-pressure chronic ulcer of skin of other sites with unspecified severity: Secondary | ICD-10-CM | POA: Diagnosis not present

## 2017-11-03 DIAGNOSIS — D508 Other iron deficiency anemias: Secondary | ICD-10-CM | POA: Diagnosis not present

## 2017-11-03 DIAGNOSIS — Z6821 Body mass index (BMI) 21.0-21.9, adult: Secondary | ICD-10-CM | POA: Diagnosis not present

## 2017-11-03 DIAGNOSIS — M150 Primary generalized (osteo)arthritis: Secondary | ICD-10-CM | POA: Diagnosis not present

## 2017-11-05 ENCOUNTER — Encounter: Payer: Self-pay | Admitting: Nurse Practitioner

## 2017-11-05 ENCOUNTER — Ambulatory Visit (INDEPENDENT_AMBULATORY_CARE_PROVIDER_SITE_OTHER): Payer: Medicare Other | Admitting: Nurse Practitioner

## 2017-11-05 VITALS — BP 122/54 | HR 61 | Temp 98.4°F | Ht 62.0 in | Wt 116.0 lb

## 2017-11-05 DIAGNOSIS — D649 Anemia, unspecified: Secondary | ICD-10-CM | POA: Diagnosis not present

## 2017-11-05 DIAGNOSIS — M810 Age-related osteoporosis without current pathological fracture: Secondary | ICD-10-CM | POA: Diagnosis not present

## 2017-11-05 DIAGNOSIS — D509 Iron deficiency anemia, unspecified: Secondary | ICD-10-CM | POA: Diagnosis not present

## 2017-11-05 LAB — HEMOGLOBIN, FINGERSTICK: HEMOGLOBIN: 11.2 g/dL (ref 11.1–15.9)

## 2017-11-05 MED ORDER — ALENDRONATE SODIUM 70 MG PO TABS: 70.0000 mg | ORAL_TABLET | ORAL | 11 refills | Status: DC

## 2017-11-05 NOTE — Patient Instructions (Signed)
Anemia Anemia is a condition in which you do not have enough red blood cells or hemoglobin. Hemoglobin is a substance in red blood cells that carries oxygen. When you do not have enough red blood cells or hemoglobin (are anemic), your body cannot get enough oxygen and your organs may not work properly. As a result, you may feel very tired or have other problems. What are the causes? Common causes of anemia include:  Excessive bleeding. Anemia can be caused by excessive bleeding inside or outside the body, including bleeding from the intestine or from periods in women.  Poor nutrition.  Long-lasting (chronic) kidney, thyroid, and liver disease.  Bone marrow disorders.  Cancer and treatments for cancer.  HIV (human immunodeficiency virus) and AIDS (acquired immunodeficiency syndrome).  Treatments for HIV and AIDS.  Spleen problems.  Blood disorders.  Infections, medicines, and autoimmune disorders that destroy red blood cells.  What are the signs or symptoms? Symptoms of this condition include:  Minor weakness.  Dizziness.  Headache.  Feeling heartbeats that are irregular or faster than normal (palpitations).  Shortness of breath, especially with exercise.  Paleness.  Cold sensitivity.  Indigestion.  Nausea.  Difficulty sleeping.  Difficulty concentrating.  Symptoms may occur suddenly or develop slowly. If your anemia is mild, you may not have symptoms. How is this diagnosed? This condition is diagnosed based on:  Blood tests.  Your medical history.  A physical exam.  Bone marrow biopsy.  Your health care provider may also check your stool (feces) for blood and may do additional testing to look for the cause of your bleeding. You may also have other tests, including:  Imaging tests, such as a CT scan or MRI.  Endoscopy.  Colonoscopy.  How is this treated? Treatment for this condition depends on the cause. If you continue to lose a lot of blood,  you may need to be treated at a hospital. Treatment may include:  Taking supplements of iron, vitamin B12, or folic acid.  Taking a hormone medicine (erythropoietin) that can help to stimulate red blood cell growth.  Having a blood transfusion. This may be needed if you lose a lot of blood.  Making changes to your diet.  Having surgery to remove your spleen.  Follow these instructions at home:  Take over-the-counter and prescription medicines only as told by your health care provider.  Take supplements only as told by your health care provider.  Follow any diet instructions that you were given.  Keep all follow-up visits as told by your health care provider. This is important. Contact a health care provider if:  You develop new bleeding anywhere in the body. Get help right away if:  You are very weak.  You are short of breath.  You have pain in your abdomen or chest.  You are dizzy or feel faint.  You have trouble concentrating.  You have bloody or black, tarry stools.  You vomit repeatedly or you vomit up blood. Summary  Anemia is a condition in which you do not have enough red blood cells or enough of a substance in your red blood cells that carries oxygen (hemoglobin).  Symptoms may occur suddenly or develop slowly.  If your anemia is mild, you may not have symptoms.  This condition is diagnosed with blood tests as well as a medical history and physical exam. Other tests may be needed.  Treatment for this condition depends on the cause of the anemia. This information is not intended to replace advice   given to you by your health care provider. Make sure you discuss any questions you have with your health care provider. Document Released: 07/31/2004 Document Revised: 07/25/2016 Document Reviewed: 07/25/2016 Elsevier Interactive Patient Education  [employer].

## 2017-11-05 NOTE — Progress Notes (Signed)
   Subjective:    Patient ID: Haley Trevino, female    DOB: 1941-01-17, 77 y.o.   MRN: [id number]   Chief Complaint: Recheck hemoglobin   HPI:  1. Low hemoglobin  Patient was seen on 10/01/17 for medical management of chronic medical problems. When labs were done her hgb was 8.9. She had stopped taking her iron supplements because she thought she did not need anymore. We stated her back on it and she is here today for recheck of labs.     Outpatient Encounter Medications as of 11/05/2017  Medication Sig  . amLODipine (NORVASC) 5 MG tablet Take 1 tablet (5 mg total) by mouth daily.  . clonazePAM (KLONOPIN) 0.5 MG tablet TAKE 1 TABLET TWICE DAILY AS NEEDED FOR ANXIETY  . FLUoxetine (PROZAC) 40 MG capsule TAKE (1) CAPSULE DAILY  . ibuprofen (ADVIL,MOTRIN) 200 MG tablet Take 600 mg by mouth every 8 (eight) hours as needed for mild pain or moderate pain.  Marland Kitchen omeprazole (PRILOSEC) 20 MG capsule TAKE 1 TABLET DAILY  . sildenafil (REVATIO) 20 MG tablet Take 20 mg by mouth 2 (two) times daily.  Marland Kitchen SYNTHROID 75 MCG tablet TAKE 1 TABLET DAILY BEFORE BREAKFAST  . [DISCONTINUED] sildenafil (REVATIO) 20 MG tablet Take 20 mg by mouth 3 (three) times daily.     New complaints: -SHe is having SOB and is scheduled to see cardiology on May 15 - dr. Amil Amen wants her on fosamax for her oteoporosis- she was on it years ago- we will start her back on it today.  Review of Systems  Constitutional: Negative.   Respiratory: Positive for shortness of breath.   Cardiovascular: Negative for leg swelling.  Gastrointestinal: Negative.   Genitourinary: Negative.   Neurological: Negative.   Psychiatric/Behavioral: Negative.   All other systems reviewed and are negative.      Objective:   Physical Exam  Constitutional: She appears well-developed and well-nourished. No distress.  Cardiovascular: Normal rate.  Pulmonary/Chest: Effort normal.  Neurological: She is alert.  Skin: Skin is warm.   BP (!)  122/54   Pulse 61   Temp 98.4 F (36.9 C) (Oral)   Ht 5\' 2"  (1.575 m)   Wt 116 lb (52.6 kg)   BMI 21.22 kg/m   hgb 11.2     Assessment & Plan:  1. Low hemoglobin - Hemoglobin, fingerstick  2. Iron deficiency anemia, unspecified iron deficiency anemia type Continue iron supplements daily  3. Osteoporosis Fosamax 70mg  1 weekly Weight bearing exercises encouraged  Mary-Margaret Hassell Done, FNP

## 2017-11-06 NOTE — Addendum Note (Signed)
Encounter addended by: Riley Churches on: 11/06/2017 3:31 PM  Actions taken: Imaging Exam ended

## 2017-11-17 NOTE — Progress Notes (Signed)
Cardiology Office Note   Date:  11/18/2017   ID:  Haley Trevino, DOB 11-02-1940, MRN [id number]  PCP:  [name], FNP  Cardiologist:   No primary care provider on file.   Chief Complaint  Patient presents with  . Chest Pain      History of Present Illness: Haley Trevino is a 77 y.o. female who presents for evaluation of chest pain.  We saw her after IR treatment of back/hip pain.  She complained of chest pain.   This was thought to be atypical and no further ischemia work up was suggested.  She did have a heart murmur with follow up echo.  She had mild AS on echo after hospital discharge.     Since I last saw her she does get some chest discomfort.  This feels like "an elephant sitting on her chest".  However, it only happens with emotional stress.  Her husband has had a stroke and she is had lots of problems caring for him.  She can do physical activity such as vacuuming and heavy housework and not bring on the symptoms.  She is limited by back pain and hip pain.  She denies any jaw discomfort or arm discomfort.  She does not have any associated nausea vomiting or diaphoresis.  She does not describe palpitations, presyncope or syncope.  She said no PND or orthopnea.     Past Medical History:  Diagnosis Date  . Cataract   . Dyspnea   . Osteoarthritis   . Raynaud disease   . Scleroderma ([place])   . Thyroid disease    hypothyroidism    Past Surgical History:  Procedure Laterality Date  . BREAST ENHANCEMENT SURGERY  1975  . BREAST IMPLANT REMOVAL Bilateral 04/23/2016   Procedure: REMOVALBILATERAL BREAST IMPLANTS;  Surgeon: Wallace Going, DO;  Location: [facility];  Service: Plastics;  Laterality: Bilateral;  . CHOLECYSTECTOMY  1973  . HAND SURGERY  08/2012; 11/2012  . IR RADIOLOGIST EVAL & MGMT  07/27/2017  . IR SACROPLASTY BILATERAL  07/31/2017  . MELANOMA EXCISION     at 89 yrs of age  . ORIF HIP FRACTURE Right 02/22/2014   Procedure: OPEN REDUCTION INTERNAL FIXATION RIGHT HIP;  Surgeon: Sanjuana Kava, MD;  Location: [facility];  Service: Orthopedics;  Laterality: Right;  . TOTAL ABDOMINAL HYSTERECTOMY  1974     Current Outpatient Medications  Medication Sig Dispense Refill  . alendronate (FOSAMAX) 70 MG tablet Take 1 tablet (70 mg total) by mouth every 7 (seven) days. Take with a full glass of water on an empty stomach. 4 tablet 11  . amLODipine (NORVASC) 5 MG tablet Take 1 tablet (5 mg total) by mouth daily. 90 tablet 1  . Cholecalciferol (VITAMIN D3) 2000 units TABS Take 2,000 Units by mouth daily.    . clonazePAM (KLONOPIN) 0.5 MG tablet TAKE 1 TABLET TWICE DAILY AS NEEDED FOR ANXIETY 60 tablet 2  . Ferrous Sulfate Dried (SLOW RELEASE IRON) 45 MG TBCR Take 45 mg by mouth daily.    Marland Kitchen FLUoxetine (PROZAC) 40 MG capsule TAKE (1) CAPSULE DAILY 90 capsule 1  . ibuprofen (ADVIL,MOTRIN) 200 MG tablet Take 600 mg by mouth every 8 (eight) hours as needed for mild pain or moderate pain.    Marland Kitchen omeprazole (PRILOSEC) 20 MG capsule TAKE 1 TABLET DAILY 90 capsule 1  . sildenafil (REVATIO) 20 MG tablet Take 20 mg by mouth 2 (two) times daily.    Marland Kitchen SYNTHROID  75 MCG tablet TAKE 1 TABLET DAILY BEFORE BREAKFAST 90 tablet 1   No current facility-administered medications for this visit.     Allergies:   Codeine; Dilaudid [hydromorphone hcl]; Hydromorphone; Acyclovir and related; Gabapentin; Aleve [naproxen sodium]; and Lyrica [pregabalin]    ROS:  Please see the history of present illness.   Otherwise, review of systems are positive for none.   All other systems are reviewed and negative.    PHYSICAL EXAM: VS:  BP 140/68   Pulse 68   Ht 5\' 2"  (1.575 m)   Wt 118 lb (53.5 kg)   BMI 21.58 kg/m  , BMI Body mass index is 21.58 kg/m. GENERAL:  Well appearing NECK:  No jugular venous distention, waveform within normal limits, carotid upstroke brisk and symmetric, no bruits, no thyromegaly LUNGS:  Clear to auscultation  bilaterally BACK:  No CVA tenderness CHEST:  Unremarkable HEART:  PMI not displaced or sustained,S1 and S2 within normal limits, no S3, no S4, no clicks, no rubs, 2 out of 6 apical early peaking systolic murmur radiating slightly at the aortic outflow tract murmurs ABD:  Flat, positive bowel sounds normal in frequency in pitch, no bruits, no rebound, no guarding, no midline pulsatile mass, no hepatomegaly, no splenomegaly EXT:  2 plus pulses throughout, no edema, no cyanosis no clubbing, chronic arthritic changes and scleroderma changes    EKG:  EKG is not ordered today.   Recent Labs: 04/02/2017: TSH 1.100 10/01/2017: ALT 10; BUN 13; Creatinine, Ser 0.80; Hemoglobin 8.9; Platelets 463; Potassium 4.1; Sodium 142    Lipid Panel    Component Value Date/Time   CHOL 232 (H) 10/01/2017 1504   TRIG 99 10/01/2017 1504   TRIG 176 (H) 09/04/2014 1419   HDL 57 10/01/2017 1504   HDL 44 09/04/2014 1419   CHOLHDL 4.1 10/01/2017 1504   LDLCALC 155 (H) 10/01/2017 1504   LDLCALC 92 11/29/2013 1718      Wt Readings from Last 3 Encounters:  11/18/17 118 lb (53.5 kg)  11/05/17 116 lb (52.6 kg)  10/01/17 117 lb (53.1 kg)      Other studies Reviewed: Additional studies/ records that were reviewed today include:  Echo and hospital records. Review of the above records demonstrates:  Please see elsewhere in the note.     ASSESSMENT AND PLAN:  CHEST PAIN:    She is now describing chest discomfort that slightly more worrisome with symptoms that are more typical for angina.  Given this stress test is indicated but I do not think she be able walk on a treadmill.  Therefore, she will have a [employer].  MURMUR:   She had mild AS.   No further work up is indicated.      Current medicines are reviewed at length with the patient today.  The patient does not have concerns regarding medicines.  The following changes have been made:  no change  Labs/ tests ordered today include:   Orders  Placed This Encounter  Procedures  . Myocardial Perfusion Imaging     Disposition:   [facility] with as needed and based on the results of the above.     Signed, [name], MD  11/18/2017 4:18 PM    [facility]

## 2017-11-18 ENCOUNTER — Encounter: Payer: Self-pay | Admitting: *Deleted

## 2017-11-18 ENCOUNTER — Encounter: Payer: Self-pay | Admitting: Cardiology

## 2017-11-18 ENCOUNTER — Ambulatory Visit (INDEPENDENT_AMBULATORY_CARE_PROVIDER_SITE_OTHER): Payer: Medicare Other | Admitting: Cardiology

## 2017-11-18 VITALS — BP 140/68 | HR 68 | Ht 62.0 in | Wt 118.0 lb

## 2017-11-18 DIAGNOSIS — R011 Cardiac murmur, unspecified: Secondary | ICD-10-CM | POA: Insufficient documentation

## 2017-11-18 DIAGNOSIS — R0609 Other forms of dyspnea: Secondary | ICD-10-CM | POA: Diagnosis not present

## 2017-11-18 DIAGNOSIS — R079 Chest pain, unspecified: Secondary | ICD-10-CM

## 2017-11-18 NOTE — Patient Instructions (Signed)
Medication Instructions:  The current medical regimen is effective;  continue present plan and medications.  Testing/Procedures: Your physician has requested that you have a lexiscan myoview. For further information please visit [email or web address]. Please follow instruction sheet, as given.  Follow-Up: Follow up as needed based on the results of the above testing.  Thank you for choosing [facility]!!

## 2017-11-26 ENCOUNTER — Telehealth (HOSPITAL_COMMUNITY): Payer: Self-pay

## 2017-11-26 NOTE — Telephone Encounter (Signed)
Encounter complete. 

## 2017-12-01 ENCOUNTER — Ambulatory Visit (HOSPITAL_COMMUNITY)
Admission: RE | Admit: 2017-12-01 | Discharge: 2017-12-01 | Disposition: A | Discharge status: Home / Self Care | Payer: Medicare Other | Source: Ambulatory Visit | Attending: Cardiovascular Disease | Admitting: Cardiovascular Disease

## 2017-12-01 DIAGNOSIS — R0609 Other forms of dyspnea: Secondary | ICD-10-CM | POA: Diagnosis not present

## 2017-12-01 DIAGNOSIS — E079 Disorder of thyroid, unspecified: Secondary | ICD-10-CM | POA: Insufficient documentation

## 2017-12-01 DIAGNOSIS — R011 Cardiac murmur, unspecified: Secondary | ICD-10-CM | POA: Insufficient documentation

## 2017-12-01 DIAGNOSIS — I10 Essential (primary) hypertension: Secondary | ICD-10-CM | POA: Diagnosis not present

## 2017-12-01 DIAGNOSIS — R079 Chest pain, unspecified: Secondary | ICD-10-CM | POA: Diagnosis not present

## 2017-12-01 DIAGNOSIS — I7300 Raynaud's syndrome without gangrene: Secondary | ICD-10-CM | POA: Diagnosis not present

## 2017-12-01 LAB — MYOCARDIAL PERFUSION IMAGING
CHL CUP NUCLEAR SRS: 1
CHL CUP NUCLEAR SSS: 1
LV sys vol: 27 mL
LVDIAVOL: 85 mL (ref 46–106)
Peak HR: 97 {beats}/min
Rest HR: 56 {beats}/min
SDS: 0
TID: 1.14

## 2017-12-01 MED ORDER — TECHNETIUM TC 99M TETROFOSMIN IV KIT
10.5000 | PACK | Freq: Once | INTRAVENOUS | Status: AC | PRN
  Administered 2017-12-01: 10.5 via INTRAVENOUS
  Filled 2017-12-01: qty 11

## 2017-12-01 MED ORDER — REGADENOSON 0.4 MG/5ML IV SOLN
0.4000 mg | Freq: Once | INTRAVENOUS | Status: AC
  Administered 2017-12-01: 0.4 mg via INTRAVENOUS

## 2017-12-01 MED ORDER — AMINOPHYLLINE 25 MG/ML IV SOLN
75.0000 mg | Freq: Once | INTRAVENOUS | Status: AC
  Administered 2017-12-01: 75 mg via INTRAVENOUS

## 2017-12-01 MED ORDER — TECHNETIUM TC 99M TETROFOSMIN IV KIT
29.4000 | PACK | Freq: Once | INTRAVENOUS | Status: AC | PRN
  Administered 2017-12-01: 29.4 via INTRAVENOUS
  Filled 2017-12-01: qty 30

## 2017-12-31 ENCOUNTER — Ambulatory Visit (INDEPENDENT_AMBULATORY_CARE_PROVIDER_SITE_OTHER): Payer: Medicare Other | Admitting: Nurse Practitioner

## 2017-12-31 ENCOUNTER — Encounter: Payer: Self-pay | Admitting: Nurse Practitioner

## 2017-12-31 VITALS — BP 144/74 | HR 66 | Temp 98.2°F | Ht 62.0 in | Wt 117.0 lb

## 2017-12-31 DIAGNOSIS — I7301 Raynaud's syndrome with gangrene: Secondary | ICD-10-CM | POA: Diagnosis not present

## 2017-12-31 DIAGNOSIS — R195 Other fecal abnormalities: Secondary | ICD-10-CM

## 2017-12-31 DIAGNOSIS — F3342 Major depressive disorder, recurrent, in full remission: Secondary | ICD-10-CM | POA: Diagnosis not present

## 2017-12-31 DIAGNOSIS — I10 Essential (primary) hypertension: Secondary | ICD-10-CM | POA: Diagnosis not present

## 2017-12-31 DIAGNOSIS — K219 Gastro-esophageal reflux disease without esophagitis: Secondary | ICD-10-CM

## 2017-12-31 DIAGNOSIS — M79646 Pain in unspecified finger(s): Secondary | ICD-10-CM | POA: Diagnosis not present

## 2017-12-31 DIAGNOSIS — M810 Age-related osteoporosis without current pathological fracture: Secondary | ICD-10-CM | POA: Diagnosis not present

## 2017-12-31 DIAGNOSIS — E785 Hyperlipidemia, unspecified: Secondary | ICD-10-CM

## 2017-12-31 DIAGNOSIS — R6889 Other general symptoms and signs: Secondary | ICD-10-CM | POA: Diagnosis not present

## 2017-12-31 DIAGNOSIS — M349 Systemic sclerosis, unspecified: Secondary | ICD-10-CM | POA: Diagnosis not present

## 2017-12-31 DIAGNOSIS — E039 Hypothyroidism, unspecified: Secondary | ICD-10-CM | POA: Diagnosis not present

## 2017-12-31 NOTE — Progress Notes (Addendum)
Subjective:    Patient ID: Haley Trevino, female    DOB: [birth year], 77 y.o.   MRN: [id number]   Chief Complaint: Medical management of chronic issues  HPI:   1. [name] with gangrene [facility])  Doing well. But always does better in summer then in winter. She is still taking vaigra daily to keep blood flowing good to fingers. She currently has no sore places on fingers.  2. Essential hypertension  No c/o chest pain, sob or headche. Doe snot check blood pressure at home. BP Readings from Last 3 Encounters:  11/18/17 140/68  11/05/17 (!) 122/54  10/01/17 (!) 126/59     3. Gastroesophageal reflux disease without esophagitis  Takes omeprazole daily- becomes very symptomatic when she does not takek  4. Hypothyroidism, unspecified type  No problems that she is aware of  5. Age-related osteoporosis without current pathological fracture  She does very little exercise since she fell and broke her hip. Her lat BMD test was done 10/07/16 and t score was -3.5. She was  on weekly fosamax which was causing body pain. She took last one on Sunday and she says she will not take anymore.  6. SCLERODERMA  She sees dr. Amil Amen every 3 months. There has been no changes to treatment plan as of late. Se is currently stable.  7. Hyperlipidemia with target LDL less than 100  Does not really watch diet very  Closely but also has very poor appetite.  8. Recurrent major depressive disorder, in full remission ([place])  She is currently on prozac and klonopin. Combination is working well for her. She is having a rough time due to a death in family as well as fact that her husband had a stoke and sheis having to care for him. She doe snot want to change her meds. Depression screen [facility] 2/9 12/31/2017 11/05/2017 10/01/2017  Decreased Interest 0 0 0  Down, Depressed, Hopeless 0 0 0  PHQ - 2 Score 0 0 0  Altered sleeping - - -  Tired, decreased energy - - -  Change in appetite - - -  Feeling bad or failure  about yourself  - - -  Trouble concentrating - - -  Moving slowly or fidgety/restless - - -  Suicidal thoughts - - -  PHQ-9 Score - - -  Difficult doing work/chores - - -  Some recent data might be hidden       Outpatient Encounter Medications as of 12/31/2017  Medication Sig  . alendronate (FOSAMAX) 70 MG tablet Take 1 tablet (70 mg total) by mouth every 7 (seven) days. Take with a full glass of water on an empty stomach.  Marland Kitchen amLODipine (NORVASC) 5 MG tablet Take 1 tablet (5 mg total) by mouth daily.  . Cholecalciferol (VITAMIN D3) 2000 units TABS Take 2,000 Units by mouth daily.  . clonazePAM (KLONOPIN) 0.5 MG tablet TAKE 1 TABLET TWICE DAILY AS NEEDED FOR ANXIETY  . Ferrous Sulfate Dried (SLOW RELEASE IRON) 45 MG TBCR Take 45 mg by mouth daily.  Marland Kitchen FLUoxetine (PROZAC) 40 MG capsule TAKE (1) CAPSULE DAILY  . ibuprofen (ADVIL,MOTRIN) 200 MG tablet Take 600 mg by mouth every 8 (eight) hours as needed for mild pain or moderate pain.  Marland Kitchen omeprazole (PRILOSEC) 20 MG capsule TAKE 1 TABLET DAILY  . sildenafil (REVATIO) 20 MG tablet Take 20 mg by mouth 2 (two) times daily.  Marland Kitchen SYNTHROID 75 MCG tablet TAKE 1 TABLET DAILY BEFORE BREAKFAST  New complaints: -Still c/o dizziness- she is not sure what ids causing it. It is mainly when she bends over and raises back up. - has 30 year history of diarrhea- always feels like she needs to poop. Has bad diarrhea and has had a few accidents. She says not had diarrhea but has had loose stools. Looks like chocolate pudding. Has appointment with DR. [name] in NOvember. Social history: Lives with husband. Does not get out house as much a she use to.  Review of Systems  Constitutional: Negative for activity change and appetite change.  HENT: Negative.   Eyes: Negative for pain.  Respiratory: Negative for shortness of breath.   Cardiovascular: Negative for chest pain, palpitations and leg swelling.  Gastrointestinal: Negative for abdominal pain.    Endocrine: Negative for polydipsia.  Genitourinary: Negative.   Musculoskeletal: Positive for arthralgias, gait problem and joint swelling.  Skin: Negative for rash.  Neurological: Negative for dizziness, weakness and headaches.  Hematological: Does not bruise/bleed easily.  Psychiatric/Behavioral: Negative.   All other systems reviewed and are negative.      Objective:   Physical Exam  Constitutional: She is oriented to person, place, and time. She appears well-developed and well-nourished. No distress.  HENT:  Head: Normocephalic.  Nose: Nose normal.  Mouth/Throat: Oropharynx is clear and moist.  Eyes: Pupils are equal, round, and reactive to light. EOM are normal.  Neck: Normal range of motion. Neck supple. No JVD present. Carotid bruit is not present.  Cardiovascular: Normal rate, regular rhythm, normal heart sounds and intact distal pulses.  Pulmonary/Chest: Effort normal and breath sounds normal. No respiratory distress. She has no wheezes. She has no rales. She exhibits no tenderness.  Abdominal: Soft. Normal appearance, normal aorta and bowel sounds are normal. She exhibits no distension, no abdominal bruit, no pulsatile midline mass and no mass. There is no splenomegaly or hepatomegaly. There is no tenderness.  Musculoskeletal: Normal range of motion. She exhibits no edema.  Walks with limp due to hip surgery  Lymphadenopathy:    She has no cervical adenopathy.  Neurological: She is alert and oriented to person, place, and time. She has normal reflexes.  Skin: Skin is warm and dry. bil fingers Fingers are very cool to touch.  Psychiatric: She has a normal mood and affect. Her behavior is normal. Judgment and thought content normal.  Nursing note and vitals reviewed.  BP (!) 153/69   Pulse 66   Temp 98.2 F (36.8 C) (Oral)   Ht '5\' 2"'  (1.575 m)   Wt 117 lb (53.1 kg)   BMI 21.40 kg/m       Assessment & Plan:  Haley Trevino comes in today with chief complaint  of Medical Management of Chronic Issues   Diagnosis and orders addressed:  1. [name] with gangrene ([place]) Continue to keep hands warm and use hand warmers as needed  2. Essential hypertension Low sodium diet - CMP14+EGFR  3. Gastroesophageal reflux disease without esophagitis Avoid spicy foods Do not eat 2 hours prior to bedtime  4. Hypothyroidism, unspecified type - Thyroid Panel With TSH  5. Age-related osteoporosis without current pathological fracture Weight bearing exercises as can tolerate  6. SCLERODERMA Keep follow up appointment with dr. Amil Amen  7. Hyperlipidemia with target LDL less than 100 Low fat diet - Lipid panel  8. Recurrent major depressive disorder, in full remission ([place]) Stress management  9. Loose stools Keep follow up with dr. Watt Climes Imodium OTC as needed Keep diary of stools  and what they apear like.  Labs pending Health Maintenance reviewed Diet and exercise encouraged  Follow up plan: 3 months   Mary-Margaret Hassell Done, FNP

## 2017-12-31 NOTE — Patient Instructions (Signed)

## 2017-12-31 NOTE — Addendum Note (Signed)
Addended by: [name] on: 12/31/2017 02:37 PM   Modules accepted: Orders

## 2018-01-01 LAB — ANEMIA PROFILE B
BASOS ABS: 0 10*3/uL (ref 0.0–0.2)
Basos: 1 %
EOS (ABSOLUTE): 0.2 10*3/uL (ref 0.0–0.4)
Eos: 3 %
FERRITIN: 15 ng/mL (ref 15–150)
FOLATE: 6.8 ng/mL (ref >3.0)
HEMOGLOBIN: 11.6 g/dL (ref 11.1–15.9)
Hematocrit: 32 % — ABNORMAL LOW (ref 34.0–46.6)
IRON: 46 ug/dL (ref 27–139)
Immature Grans (Abs): 0 10*3/uL (ref 0.0–0.1)
Immature Granulocytes: 0 %
Iron Saturation: 15 % (ref 15–55)
LYMPHS ABS: 2 10*3/uL (ref 0.7–3.1)
LYMPHS: 27 %
MCH: 31.4 pg (ref 26.6–33.0)
MCHC: 36.3 g/dL — AB (ref 31.5–35.7)
MCV: 87 fL (ref 79–97)
MONOS ABS: 0.5 10*3/uL (ref 0.1–0.9)
Monocytes: 7 %
NEUTROS ABS: 4.6 10*3/uL (ref 1.4–7.0)
NEUTROS PCT: 62 %
PLATELETS: 342 10*3/uL (ref 150–450)
RBC: 3.7 x10E6/uL — ABNORMAL LOW (ref 3.77–5.28)
RDW: 16.7 % — AB (ref 12.3–15.4)
RETIC CT PCT: 0.6 % (ref 0.6–2.6)
Total Iron Binding Capacity: 313 ug/dL (ref 250–450)
UIBC: 267 ug/dL (ref 118–369)
VITAMIN B 12: 396 pg/mL (ref 232–1245)
WBC: 7.4 10*3/uL (ref 3.4–10.8)

## 2018-01-01 LAB — THYROID PANEL WITH TSH
Free Thyroxine Index: 2.1 (ref 1.2–4.9)
T3 UPTAKE RATIO: 24 % (ref 24–39)
T4, Total: 8.8 ug/dL (ref 4.5–12.0)
TSH: 0.784 u[IU]/mL (ref 0.450–4.500)

## 2018-01-01 LAB — CMP14+EGFR
ALBUMIN: 4.2 g/dL (ref 3.5–4.8)
ALK PHOS: 60 IU/L (ref 39–117)
ALT: 9 IU/L (ref 0–32)
AST: 12 IU/L (ref 0–40)
Albumin/Globulin Ratio: 1.7 (ref 1.2–2.2)
BUN / CREAT RATIO: 10 — AB (ref 12–28)
BUN: 8 mg/dL (ref 8–27)
Bilirubin Total: <0.2 mg/dL (ref 0.0–1.2)
CO2: 23 mmol/L (ref 20–29)
CREATININE: 0.77 mg/dL (ref 0.57–1.00)
Calcium: 8.9 mg/dL (ref 8.7–10.3)
Chloride: 108 mmol/L — ABNORMAL HIGH (ref 96–106)
GFR calc Af Amer: 87 mL/min/{1.73_m2} (ref >59)
GFR calc non Af Amer: 75 mL/min/{1.73_m2} (ref >59)
GLOBULIN, TOTAL: 2.5 g/dL (ref 1.5–4.5)
Glucose: 82 mg/dL (ref 65–99)
Potassium: 3.8 mmol/L (ref 3.5–5.2)
SODIUM: 138 mmol/L (ref 134–144)
TOTAL PROTEIN: 6.7 g/dL (ref 6.0–8.5)

## 2018-01-01 LAB — LIPID PANEL
CHOL/HDL RATIO: 4.5 ratio — AB (ref 0.0–4.4)
CHOLESTEROL TOTAL: 207 mg/dL — AB (ref 100–199)
HDL: 46 mg/dL (ref >39)
LDL CALC: 128 mg/dL — AB (ref 0–99)
Triglycerides: 163 mg/dL — ABNORMAL HIGH (ref 0–149)
VLDL Cholesterol Cal: 33 mg/dL (ref 5–40)

## 2018-02-01 ENCOUNTER — Other Ambulatory Visit: Payer: Self-pay | Admitting: Nurse Practitioner

## 2018-02-01 DIAGNOSIS — F3342 Major depressive disorder, recurrent, in full remission: Secondary | ICD-10-CM

## 2018-02-01 NOTE — Telephone Encounter (Signed)
Last seen 12/31/17  MMM

## 2018-03-23 DIAGNOSIS — I7300 Raynaud's syndrome without gangrene: Secondary | ICD-10-CM | POA: Diagnosis not present

## 2018-03-23 DIAGNOSIS — M340 Progressive systemic sclerosis: Secondary | ICD-10-CM | POA: Diagnosis not present

## 2018-03-23 DIAGNOSIS — D508 Other iron deficiency anemias: Secondary | ICD-10-CM | POA: Diagnosis not present

## 2018-03-23 DIAGNOSIS — M150 Primary generalized (osteo)arthritis: Secondary | ICD-10-CM | POA: Diagnosis not present

## 2018-03-23 DIAGNOSIS — M810 Age-related osteoporosis without current pathological fracture: Secondary | ICD-10-CM | POA: Diagnosis not present

## 2018-03-23 DIAGNOSIS — M542 Cervicalgia: Secondary | ICD-10-CM | POA: Diagnosis not present

## 2018-03-23 DIAGNOSIS — K219 Gastro-esophageal reflux disease without esophagitis: Secondary | ICD-10-CM | POA: Diagnosis not present

## 2018-03-23 DIAGNOSIS — L98499 Non-pressure chronic ulcer of skin of other sites with unspecified severity: Secondary | ICD-10-CM | POA: Diagnosis not present

## 2018-03-23 DIAGNOSIS — Z6821 Body mass index (BMI) 21.0-21.9, adult: Secondary | ICD-10-CM | POA: Diagnosis not present

## 2018-03-29 ENCOUNTER — Encounter: Payer: Medicare Other | Admitting: *Deleted

## 2018-03-31 ENCOUNTER — Other Ambulatory Visit: Payer: Self-pay | Admitting: Nurse Practitioner

## 2018-03-31 DIAGNOSIS — F3342 Major depressive disorder, recurrent, in full remission: Secondary | ICD-10-CM

## 2018-03-31 DIAGNOSIS — I7301 Raynaud's syndrome with gangrene: Secondary | ICD-10-CM

## 2018-03-31 DIAGNOSIS — K219 Gastro-esophageal reflux disease without esophagitis: Secondary | ICD-10-CM

## 2018-04-01 ENCOUNTER — Other Ambulatory Visit: Payer: Self-pay | Admitting: Nurse Practitioner

## 2018-04-01 DIAGNOSIS — F3342 Major depressive disorder, recurrent, in full remission: Secondary | ICD-10-CM

## 2018-04-01 MED ORDER — CLONAZEPAM 0.5 MG PO TABS: 0.5000 mg | ORAL_TABLET | Freq: Two times a day (BID) | ORAL | 0 refills | Status: DC

## 2018-04-07 ENCOUNTER — Other Ambulatory Visit: Payer: Self-pay | Admitting: Nurse Practitioner

## 2018-04-07 DIAGNOSIS — E039 Hypothyroidism, unspecified: Secondary | ICD-10-CM

## 2018-04-08 ENCOUNTER — Encounter: Payer: Self-pay | Admitting: Nurse Practitioner

## 2018-04-08 ENCOUNTER — Ambulatory Visit (INDEPENDENT_AMBULATORY_CARE_PROVIDER_SITE_OTHER): Payer: Medicare Other | Admitting: Nurse Practitioner

## 2018-04-08 VITALS — BP 130/56 | HR 56 | Temp 98.1°F | Ht 62.0 in | Wt 115.0 lb

## 2018-04-08 DIAGNOSIS — Z87898 Personal history of other specified conditions: Secondary | ICD-10-CM | POA: Diagnosis not present

## 2018-04-08 DIAGNOSIS — E785 Hyperlipidemia, unspecified: Secondary | ICD-10-CM | POA: Diagnosis not present

## 2018-04-08 DIAGNOSIS — F3342 Major depressive disorder, recurrent, in full remission: Secondary | ICD-10-CM | POA: Diagnosis not present

## 2018-04-08 DIAGNOSIS — I10 Essential (primary) hypertension: Secondary | ICD-10-CM | POA: Diagnosis not present

## 2018-04-08 DIAGNOSIS — M349 Systemic sclerosis, unspecified: Secondary | ICD-10-CM

## 2018-04-08 DIAGNOSIS — E039 Hypothyroidism, unspecified: Secondary | ICD-10-CM | POA: Diagnosis not present

## 2018-04-08 DIAGNOSIS — Z23 Encounter for immunization: Secondary | ICD-10-CM

## 2018-04-08 DIAGNOSIS — M810 Age-related osteoporosis without current pathological fracture: Secondary | ICD-10-CM | POA: Diagnosis not present

## 2018-04-08 DIAGNOSIS — I7301 Raynaud's syndrome with gangrene: Secondary | ICD-10-CM | POA: Diagnosis not present

## 2018-04-08 DIAGNOSIS — K219 Gastro-esophageal reflux disease without esophagitis: Secondary | ICD-10-CM | POA: Diagnosis not present

## 2018-04-08 DIAGNOSIS — M25551 Pain in right hip: Secondary | ICD-10-CM

## 2018-04-08 MED ORDER — SYNTHROID 75 MCG PO TABS: 75.0000 ug | ORAL_TABLET | Freq: Every day | ORAL | 1 refills | Status: DC

## 2018-04-08 MED ORDER — CLONAZEPAM 0.5 MG PO TABS: 0.5000 mg | ORAL_TABLET | Freq: Two times a day (BID) | ORAL | 2 refills | Status: DC

## 2018-04-08 MED ORDER — MECLIZINE HCL 25 MG PO TABS: 25.0000 mg | ORAL_TABLET | Freq: Three times a day (TID) | ORAL | 0 refills | Status: DC | PRN

## 2018-04-08 MED ORDER — OMEPRAZOLE 20 MG PO CPDR: DELAYED_RELEASE_CAPSULE | ORAL | 1 refills | Status: DC

## 2018-04-08 MED ORDER — AMLODIPINE BESYLATE 5 MG PO TABS: 5.0000 mg | ORAL_TABLET | Freq: Every day | ORAL | 1 refills | Status: DC

## 2018-04-08 MED ORDER — FLUOXETINE HCL 40 MG PO CAPS: 40.0000 mg | ORAL_CAPSULE | Freq: Every day | ORAL | 1 refills | Status: DC

## 2018-04-08 NOTE — Progress Notes (Signed)
Subjective:    Patient ID: Haley Trevino, female    DOB: [birth year], 77 y.o.   MRN: [id number]   Chief Complaint: Medical management of chronic issues  HPI:  1. [name] with gangrene ([place])  Has not had much problems with this the last several months because eit has been so warm outside. She is on viagra which helps prevent her from developing sores on her fingers.  2. Essential hypertension  No c/o chest pain, sob or headache. Does not check blood pressure at home  3. Gastroesophageal reflux disease without esophagitis  Takes daily omeprazole which works well to keep symptoms under control.  4. No problems that she ois awareof.  5. Age-related osteoporosis without current pathological fracture  Last dexascan was done 10/07/16 with tscore of -3.5. She is currently not on anything. Really need to check on getting her on prolia.  6. SCLERODERMA  Sees dr. Amil Amen every 4 months- there has been no change to care.  7. Recurrent major depressive disorder, in full remission ([place])  Is on prozac and has ben for sveral years- she says she is doing well. Depression screen [facility] 2/9 04/08/2018 12/31/2017 11/05/2017  Decreased Interest 0 0 0  Down, Depressed, Hopeless 0 0 0  PHQ - 2 Score 0 0 0  Altered sleeping - - -  Tired, decreased energy - - -  Change in appetite - - -  Feeling bad or failure about yourself  - - -  Trouble concentrating - - -  Moving slowly or fidgety/restless - - -  Suicidal thoughts - - -  PHQ-9 Score - - -  Difficult doing work/chores - - -  Some recent data might be hidden     8. Hyperlipidemia with target LDL less than 100  watches diet. Is not able to do much exercise.    Outpatient Encounter Medications as of 04/08/2018  Medication Sig  . amLODipine (NORVASC) 5 MG tablet Take 1 tablet (5 mg total) by mouth daily.  . Cholecalciferol (VITAMIN D3) 2000 units TABS Take 2,000 Units by mouth daily.  . clonazePAM (KLONOPIN) 0.5 MG tablet Take 1 tablet  (0.5 mg total) by mouth 2 (two) times daily.  . Ferrous Sulfate Dried (SLOW RELEASE IRON) 45 MG TBCR Take 45 mg by mouth daily.  Marland Kitchen FLUoxetine (PROZAC) 40 MG capsule TAKE (1) CAPSULE DAILY  . ibuprofen (ADVIL,MOTRIN) 200 MG tablet Take 600 mg by mouth every 8 (eight) hours as needed for mild pain or moderate pain.  Marland Kitchen omeprazole (PRILOSEC) 20 MG capsule TAKE 1 TABLET DAILY  . sildenafil (REVATIO) 20 MG tablet Take 20 mg by mouth 2 (two) times daily.  Marland Kitchen SYNTHROID 75 MCG tablet TAKE 1 TABLET DAILY BEFORE BREAKFAST       New complaints: -Has rod in right femur from fracture- increasing pain- wants to see dr. Kerin Perna to check it out. - vertigo had bad episode about 3 weks ago- lsatsed several days with nausea and vomiting Social history: Lives with hr husband    Review of Systems  Constitutional: Negative for activity change and appetite change.  HENT: Negative.   Eyes: Negative for pain.  Respiratory: Negative for shortness of breath.   Cardiovascular: Negative for chest pain, palpitations and leg swelling.  Gastrointestinal: Negative for abdominal pain.  Endocrine: Negative for polydipsia.  Genitourinary: Negative.   Skin: Negative for rash.  Neurological: Negative for dizziness, weakness and headaches.  Hematological: Does not bruise/bleed easily.  Psychiatric/Behavioral: Negative.   All other  systems reviewed and are negative.      Objective:   Physical Exam  Constitutional: She is oriented to person, place, and time. She appears well-developed and well-nourished. No distress.  HENT:  Head: Normocephalic.  Nose: Nose normal.  Mouth/Throat: Oropharynx is clear and moist.  Eyes: Pupils are equal, round, and reactive to light. EOM are normal.  Neck: Normal range of motion. Neck supple. No JVD present. Carotid bruit is not present.  Cardiovascular: Normal rate, regular rhythm, normal heart sounds and intact distal pulses.  Pulmonary/Chest: Effort normal and breath sounds  normal. No respiratory distress. She has no wheezes. She has no rales. She exhibits no tenderness.  Abdominal: Soft. Normal appearance, normal aorta and bowel sounds are normal. She exhibits no distension, no abdominal bruit, no pulsatile midline mass and no mass. There is no splenomegaly or hepatomegaly. There is no tenderness.  Musculoskeletal: Normal range of motion. She exhibits no edema.  Lymphadenopathy:    She has no cervical adenopathy.  Neurological: She is alert and oriented to person, place, and time. She has normal reflexes.  Skin: Skin is warm and dry.  bil hands and fingers are pale and cold to touch.  Psychiatric: She has a normal mood and affect. Her behavior is normal. Judgment and thought content normal.  Nursing note and vitals reviewed.  BP (!) 130/56   Pulse (!) 56   Temp 98.1 F (36.7 C) (Oral)   Ht _0  (1.575 m)   Wt 115 lb (52.2 kg)   BMI 21.03 kg/m         Assessment & Plan:  Haley Trevino comes in today with chief complaint of Medical Management of Chronic Issues   Diagnosis and orders addressed:  1. [name] with gangrene ([place]) Hand warmers as needed - amLODipine (NORVASC) 5 MG tablet; Take 1 tablet (5 mg total) by mouth daily.  Dispense: 90 tablet; Refill: 1  2. Essential hypertension Low sodium diet - CMP14+EGFR  3. Gastroesophageal reflux disease without esophagitis Avoid spicy foods Do not eat 2 hours prior to bedtime - omeprazole (PRILOSEC) 20 MG capsule; TAKE 1 TABLET DAILY  Dispense: 90 capsule; Refill: 1  4. Hypothyroidism, unspecified type - Thyroid Panel With TSH - SYNTHROID 75 MCG tablet; Take 1 tablet (75 mcg total) by mouth daily before breakfast.  Dispense: 90 tablet; Refill: 1  5. Age-related osteoporosis without current pathological fracture We are going to check into getting prolia injections  6. SCLERODERMA Keep follow up with dr. Amil Amen  7. Recurrent major depressive disorder, in full remission  ([place]) Stress management - clonazePAM (KLONOPIN) 0.5 MG tablet; Take 1 tablet (0.5 mg total) by mouth 2 (two) times daily.  Dispense: 60 tablet; Refill: 2 - FLUoxetine (PROZAC) 40 MG capsule; Take 1 capsule (40 mg total) by mouth daily.  Dispense: 90 capsule; Refill: 1  8. Hyperlipidemia with target LDL less than 100 Low fat diet - Lipid panel  9. Right hip pain - Ambulatory referral to Orthopedic Surgery  10. Vertigo - meclizine 39m 1 po tid prn -force fluids Keep diary of episodes  Labs pending Health Maintenance reviewed Diet and exercise encouraged  Follow up plan: 3 month   [place] FNP

## 2018-04-08 NOTE — Patient Instructions (Signed)

## 2018-04-09 LAB — CMP14+EGFR
A/G RATIO: 1.7 (ref 1.2–2.2)
ALT: 8 IU/L (ref 0–32)
AST: 15 IU/L (ref 0–40)
Albumin: 4.3 g/dL (ref 3.5–4.8)
Alkaline Phosphatase: 59 IU/L (ref 39–117)
BUN/Creatinine Ratio: 13 (ref 12–28)
BUN: 12 mg/dL (ref 8–27)
Bilirubin Total: <0.2 mg/dL (ref 0.0–1.2)
CALCIUM: 9.1 mg/dL (ref 8.7–10.3)
CO2: 22 mmol/L (ref 20–29)
Chloride: 103 mmol/L (ref 96–106)
Creatinine, Ser: 0.89 mg/dL (ref 0.57–1.00)
GFR calc Af Amer: 73 mL/min/{1.73_m2} (ref >59)
GFR, EST NON AFRICAN AMERICAN: 63 mL/min/{1.73_m2} (ref >59)
Globulin, Total: 2.5 g/dL (ref 1.5–4.5)
Glucose: 84 mg/dL (ref 65–99)
POTASSIUM: 3.7 mmol/L (ref 3.5–5.2)
Sodium: 141 mmol/L (ref 134–144)
Total Protein: 6.8 g/dL (ref 6.0–8.5)

## 2018-04-09 LAB — LIPID PANEL
CHOL/HDL RATIO: 4.7 ratio — AB (ref 0.0–4.4)
Cholesterol, Total: 220 mg/dL — ABNORMAL HIGH (ref 100–199)
HDL: 47 mg/dL (ref >39)
LDL Calculated: 138 mg/dL — ABNORMAL HIGH (ref 0–99)
TRIGLYCERIDES: 173 mg/dL — AB (ref 0–149)
VLDL Cholesterol Cal: 35 mg/dL (ref 5–40)

## 2018-04-09 LAB — THYROID PANEL WITH TSH
FREE THYROXINE INDEX: 2.2 (ref 1.2–4.9)
T3 Uptake Ratio: 24 % (ref 24–39)
T4, Total: 9 ug/dL (ref 4.5–12.0)
TSH: 1.24 u[IU]/mL (ref 0.450–4.500)

## 2018-04-12 MED ORDER — ROSUVASTATIN CALCIUM 10 MG PO TABS: 10.0000 mg | ORAL_TABLET | Freq: Every day | ORAL | 1 refills | Status: DC

## 2018-04-12 NOTE — Addendum Note (Signed)
Addended by: [name] on: 04/12/2018 12:47 PM   Modules accepted: Orders

## 2018-04-14 ENCOUNTER — Ambulatory Visit (INDEPENDENT_AMBULATORY_CARE_PROVIDER_SITE_OTHER): Payer: Medicare Other

## 2018-04-14 ENCOUNTER — Ambulatory Visit (INDEPENDENT_AMBULATORY_CARE_PROVIDER_SITE_OTHER): Payer: Medicare Other | Admitting: Orthopaedic Surgery

## 2018-04-14 ENCOUNTER — Encounter (INDEPENDENT_AMBULATORY_CARE_PROVIDER_SITE_OTHER): Payer: Self-pay | Admitting: Orthopaedic Surgery

## 2018-04-14 ENCOUNTER — Telehealth: Payer: Self-pay | Admitting: *Deleted

## 2018-04-14 VITALS — BP 147/75 | HR 66 | Ht 62.0 in | Wt 114.0 lb

## 2018-04-14 DIAGNOSIS — M25551 Pain in right hip: Secondary | ICD-10-CM

## 2018-04-14 MED ORDER — DENOSUMAB 60 MG/ML ~~LOC~~ SOSY: 60.0000 mg | PREFILLED_SYRINGE | Freq: Once | SUBCUTANEOUS | 0 refills | Status: AC

## 2018-04-14 NOTE — Telephone Encounter (Signed)
Patient receives prescription assistance through the Medicare Extra Help program. She does not have a "donut hole" to worry about and her mediations are $8 for namebrand. I sent the Prolia Rx to [facility] to have them run it through her pharmacy benefit. If it comes back that she only owes $8 that will be the best way to go. If they do not cover it then I will send her information on the [employer].

## 2018-04-14 NOTE — Telephone Encounter (Signed)
Prolia requested by PCP, [name], FNP for treatment of age related osteoporosis. Patient has a history of an osteoporotic fracture and a T score of -3.5 on 10/07/2016. She has taken alendronate in the past but d/c due to side effects.   Insurance verification submitted and an instant summary of benefits was returned.   Coverage Details: Benefits subject to a $185 deductible ($185 met) and 20% co-insurance for the administration and cost of Prolia.   Deductible has been met and must be purchased as "buy and bill". Not available for coverage at the pharmacy. Approximate cost to patient would be around $250 per injection. She may qualify for financial assistance either through the Medicare Extra Help Program or if not, the [facility] may be an option.

## 2018-04-14 NOTE — Progress Notes (Signed)
Office Visit Note   Patient: Haley Trevino           Date of Birth: Sep 06, 1940           MRN: [id number] Visit Date: 04/14/2018              Requested by: Haley Trevino, [address] [place] [place], [place] [address] PCP: Haley Pretty, FNP   Assessment & Plan: Visit Diagnoses:  1. Pain of right hip joint     Plan: right hip pain probably related to DJD L-S spine. Suggest exerc ises. If no improvement in next sev mos consider MRI   Follow-Up Instructions: Return if symptoms worsen or fail to improve.   Orders:  No orders of the defined types were placed in this encounter.  No orders of the defined types were placed in this encounter.     Procedures: No procedures performed   Clinical Data: No additional findings.   Subjective: No chief complaint on file. Haley Trevino is 77 yrs old and visits the office for eval of right hip pain. Insidious onset after hip plating by Dr Haley Trevino 3 yrs ago. Present after sitting for a long time. Located lat right hip with occ LBP. No numbness or tingling. Chronic hx of LBP  HPI  Review of Systems   Objective: Vital Signs: There were no vitals taken for this visit.  Physical Exam  Constitutional: She is oriented to person, place, and time. She appears well-developed and well-nourished.  HENT:  Mouth/Throat: Oropharynx is clear and moist.  Eyes: Pupils are equal, round, and reactive to light. EOM are normal.  Pulmonary/Chest: Effort normal.  Neurological: She is alert and oriented to person, place, and time.  Skin: Skin is warm and dry.  Psychiatric: She has a normal mood and affect. Her behavior is normal.    Ortho ExamPainless ROM right hip. Lat incision well healed. Some muscle  Atrophy right thigh. Percussible tenderness L-S spine. N/v intact  Specialty Comments:  No specialty comments available.  Imaging: No results found.   PMFS History: Patient Active Problem List   Diagnosis Date Noted    . Murmur 11/18/2017  . Impingement syndrome of left shoulder region 12/08/2014  . Osteoporosis 09/20/2014  . Hip fracture requiring operative repair ([place]) 02/22/2014  . Hypothyroidism 11/29/2013  . Depression 11/29/2013  . GERD (gastroesophageal reflux disease) 11/29/2013  . Hypertension 11/29/2013  . Hyperlipidemia with target LDL less than 100 11/29/2013  . Thyroid cyst 04/26/2013  . Haley Trevino, SKIN 08/07/2010  . [name] 08/06/2010  . SCLERODERMA 08/06/2010  . Osteoarthritis 08/06/2010   Past Medical History:  Diagnosis Date  . Cataract   . Dyspnea   . Osteoarthritis   . Raynaud disease   . Scleroderma ([place])   . Thyroid disease    hypothyroidism    Family History  Problem Relation Age of Onset  . Pancreatic cancer Father   . Raynaud syndrome Father   . Cancer Father        pancreatic  . Rashes / Skin problems Daughter        possibly scleraderma  . Hip fracture Mother   . Mental illness Mother        attempted suicide at 23 yo    Past Surgical History:  Procedure Laterality Date  . BREAST ENHANCEMENT SURGERY  1975  . BREAST IMPLANT REMOVAL Bilateral 04/23/2016   Procedure: REMOVALBILATERAL BREAST IMPLANTS;  Surgeon: Haley Going, DO;  Location: [facility];  Service: Clinical cytogeneticist;  Laterality: Bilateral;  . CHOLECYSTECTOMY  1973  . HAND SURGERY  08/2012; 11/2012  . IR RADIOLOGIST EVAL & MGMT  07/27/2017  . IR SACROPLASTY BILATERAL  07/31/2017  . Trevino EXCISION     at 12 yrs of age  . ORIF HIP FRACTURE Right 02/22/2014   Procedure: OPEN REDUCTION INTERNAL FIXATION RIGHT HIP;  Surgeon: Haley Kava, MD;  Location: [facility];  Service: Orthopedics;  Laterality: Right;  . TOTAL ABDOMINAL HYSTERECTOMY  1974   Social History   Occupational History  . Occupation: retired Occupational psychologist at [employer]  Tobacco Use  . Smoking status: Never Smoker  . Smokeless tobacco: Never Used  . Tobacco comment: passive tobacco smoke exposure as  child  Substance and Sexual Activity  . Alcohol use: No  . Drug use: No  . Sexual activity: Yes     Haley Balding, MD   Note - This record has been created using [employer].  Chart creation errors have been sought, but may not always  have been located. Such creation errors do not reflect on  the standard of medical care.

## 2018-04-16 ENCOUNTER — Encounter (INDEPENDENT_AMBULATORY_CARE_PROVIDER_SITE_OTHER): Payer: Self-pay | Admitting: Orthopaedic Surgery

## 2018-04-20 ENCOUNTER — Ambulatory Visit (INDEPENDENT_AMBULATORY_CARE_PROVIDER_SITE_OTHER): Payer: Medicare Other | Admitting: *Deleted

## 2018-04-20 ENCOUNTER — Encounter: Payer: Self-pay | Admitting: *Deleted

## 2018-04-20 VITALS — BP 163/65 | HR 68 | Ht 62.0 in | Wt 116.0 lb

## 2018-04-20 DIAGNOSIS — Z23 Encounter for immunization: Secondary | ICD-10-CM

## 2018-04-20 DIAGNOSIS — Z0000 Encounter for general adult medical examination without abnormal findings: Secondary | ICD-10-CM | POA: Diagnosis not present

## 2018-04-20 NOTE — Patient Instructions (Signed)
Please work on your goal of completing your medical evaluations with Dr. Watt Climes, Dr. Joneen Boers, and ENT if needed.   Please review the information on Advance Directives, and if you complete these please bring a copy to our office to be filed in your medical record.   You received your 1st Shingrix (Shingles) vaccine today, you will need the 2nd dose after 06/20/18.   Thank you for coming in for your Annual Wellness Visit today!!  Preventive Care 65 Years and Older, Female Preventive care refers to lifestyle choices and visits with your health care provider that can promote health and wellness. What does preventive care include?  A yearly physical exam. This is also called an annual well check.  Dental exams once or twice a year.  Routine eye exams. Ask your health care provider how often you should have your eyes checked.  Personal lifestyle choices, including: ? Daily care of your teeth and gums. ? Regular physical activity. ? Eating a healthy diet. ? Avoiding tobacco and drug use. ? Limiting alcohol use. ? Practicing safe sex. ? Taking low-dose aspirin every day. ? Taking vitamin and mineral supplements as recommended by your health care provider. What happens during an annual well check? The services and screenings done by your health care provider during your annual well check will depend on your age, overall health, lifestyle risk factors, and family history of disease. Counseling Your health care provider may ask you questions about your:  Alcohol use.  Tobacco use.  Drug use.  Emotional well-being.  Home and relationship well-being.  Sexual activity.  Eating habits.  History of falls.  Memory and ability to understand (cognition).  Work and work Statistician.  Reproductive health.  Screening You may have the following tests or measurements:  Height, weight, and BMI.  Blood pressure.  Lipid and cholesterol levels. These may be checked every 5 years, or  more frequently if you are over 7 years old.  Skin check.  Lung cancer screening. You may have this screening every year starting at age 75 if you have a 30-pack-year history of smoking and currently smoke or have quit within the past 15 years.  Fecal occult blood test (FOBT) of the stool. You may have this test every year starting at age 34.  Flexible sigmoidoscopy or colonoscopy. You may have a sigmoidoscopy every 5 years or a colonoscopy every 10 years starting at age 94.  Hepatitis C blood test.  Hepatitis B blood test.  Sexually transmitted disease (STD) testing.  Diabetes screening. This is done by checking your blood sugar (glucose) after you have not eaten for a while (fasting). You may have this done every 1-3 years.  Bone density scan. This is done to screen for osteoporosis. You may have this done starting at age 65.  Mammogram. This may be done every 1-2 years. Talk to your health care provider about how often you should have regular mammograms.  Talk with your health care provider about your test results, treatment options, and if necessary, the need for more tests. Vaccines Your health care provider may recommend certain vaccines, such as:  Influenza vaccine. This is recommended every year.  Tetanus, diphtheria, and acellular pertussis (Tdap, Td) vaccine. You may need a Td booster every 10 years.  Varicella vaccine. You may need this if you have not been vaccinated.  Zoster vaccine. You may need this after age 32.  Measles, mumps, and rubella (MMR) vaccine. You may need at least one dose of MMR if  you were born in [birth year] or later. You may also need a second dose.  Pneumococcal 13-valent conjugate (PCV13) vaccine. One dose is recommended after age 41.  Pneumococcal polysaccharide (PPSV23) vaccine. One dose is recommended after age 71.  Meningococcal vaccine. You may need this if you have certain conditions.  Hepatitis A vaccine. You may need this if you have  certain conditions or if you travel or work in places where you may be exposed to hepatitis A.  Hepatitis B vaccine. You may need this if you have certain conditions or if you travel or work in places where you may be exposed to hepatitis B.  Haemophilus influenzae type b (Hib) vaccine. You may need this if you have certain conditions.  Talk to your health care provider about which screenings and vaccines you need and how often you need them. This information is not intended to replace advice given to you by your health care provider. Make sure you discuss any questions you have with your health care provider. Document Released: 07/20/2015 Document Revised: 03/12/2016 Document Reviewed: 04/24/2015 Elsevier Interactive Patient Education  [employer].

## 2018-04-20 NOTE — Progress Notes (Signed)
Subjective:   Haley Trevino is a 77 y.o. female who presents for Medicare Annual (Subsequent) preventive examination.  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  She enjoys reading and decorating her home.  She lives at home with her husband, and their 3 cats.  Haley Trevino has 2 children, 1 son is deceased, 3 grandchildren, 1 grandson is deceased.  The deaths of her son and grandson have caused much grief in Haley Trevino's life, but she states she has been to counseling and has the tools needed to cope with her losses.  She feels her health this year is worse than last year because she has had 2 episodes of vertigo.  She reports no hospitalizations, ER visits, or surgeries in the past year.      Review of Systems:   Musculoskeletal - back pain Gastrointestinal - abdominal pain and diarrhea          Objective:     Vitals: BP (!) 163/65   Pulse 68   Ht 5\' 2"  (1.575 m)   Wt 116 lb (52.6 kg)   BMI 21.22 kg/m   Body mass index is 21.22 kg/m.  Advanced Directives 04/20/2018 07/31/2017 03/26/2017 04/23/2016 04/16/2016 02/13/2015 01/03/2015  Does Patient Have a Medical Advance Directive? No No No No No Yes Yes  Type of Advance Directive - - - - - Catering manager  Does patient want to make changes to medical advance directive? - No - Patient declined - - - - -  Copy of Press photographer in Chart? - - - - - - -  Would patient like information on creating a medical advance directive? Yes (MAU/Ambulatory/Procedural Areas - Information given) No - Patient declined Yes (MAU/Ambulatory/Procedural Areas - Information given) No - patient declined information - - -    Tobacco Social History   Tobacco Use  Smoking Status Never Smoker  Smokeless Tobacco Never Used  Tobacco Comment   passive tobacco smoke exposure as child     Counseling given: Not  Answered Comment: passive tobacco smoke exposure as child   Clinical Intake:     Pain Score: 6                  Past Medical History:  Diagnosis Date  . Cataract   . Dyspnea   . Osteoarthritis   . Raynaud disease   . Scleroderma ([place])   . Thyroid disease    hypothyroidism   Past Surgical History:  Procedure Laterality Date  . BREAST ENHANCEMENT SURGERY  1975  . BREAST IMPLANT REMOVAL Bilateral 04/23/2016   Procedure: REMOVALBILATERAL BREAST IMPLANTS;  Surgeon: Wallace Going, DO;  Location: [facility];  Service: Plastics;  Laterality: Bilateral;  . CHOLECYSTECTOMY  1973  . HAND SURGERY  08/2012; 11/2012  . IR RADIOLOGIST EVAL & MGMT  07/27/2017  . IR SACROPLASTY BILATERAL  07/31/2017  . MELANOMA EXCISION     at 59 yrs of age  . ORIF HIP FRACTURE Right 02/22/2014   Procedure: OPEN REDUCTION INTERNAL FIXATION RIGHT HIP;  Surgeon: Sanjuana Kava, MD;  Location: [facility];  Service: Orthopedics;  Laterality: Right;  . TOTAL ABDOMINAL HYSTERECTOMY  1974   Family History  Problem Relation Age of Onset  . Pancreatic cancer Father   . Raynaud syndrome Father   . Cancer Father        pancreatic  .  Rashes / Skin problems Daughter        possibly scleraderma  . Hip fracture Mother   . Mental illness Mother        attempted suicide at 60 yo   Social History   Socioeconomic History  . Marital status: Married    Spouse name: Not on file  . Number of children: Not on file  . Years of education: Not on file  . Highest education level: Not on file  Occupational History  . Occupation: retired Occupational psychologist at [employer]  . Financial resource strain: Not on file  . Food insecurity:    Worry: Not on file    Inability: Not on file  . Transportation needs:    Medical: Not on file    Non-medical: Not on file  Tobacco Use  . Smoking status: Never Smoker  . Smokeless tobacco: Never Used  . Tobacco comment: passive tobacco smoke exposure as  child  Substance and Sexual Activity  . Alcohol use: No  . Drug use: No  . Sexual activity: Yes  Lifestyle  . Physical activity:    Days per week: Not on file    Minutes per session: Not on file  . Stress: Not on file  Relationships  . Social connections:    Talks on phone: Not on file    Gets together: Not on file    Attends religious service: Not on file    Active member of club or organization: Not on file    Attends meetings of clubs or organizations: Not on file    Relationship status: Not on file  Other Topics Concern  . Not on file  Social History Narrative   Regular exercise: walking   Caffeine use: 1 cup of coffee daily    Outpatient Encounter Medications as of 04/20/2018  Medication Sig  . amLODipine (NORVASC) 5 MG tablet Take 1 tablet (5 mg total) by mouth daily.  . Cholecalciferol (VITAMIN D3) 2000 units TABS Take 2,000 Units by mouth daily.  . clonazePAM (KLONOPIN) 0.5 MG tablet Take 1 tablet (0.5 mg total) by mouth 2 (two) times daily.  . Ferrous Sulfate Dried (SLOW RELEASE IRON) 45 MG TBCR Take 45 mg by mouth daily.  Marland Kitchen FLUoxetine (PROZAC) 40 MG capsule Take 1 capsule (40 mg total) by mouth daily.  . meclizine (ANTIVERT) 25 MG tablet Take 1 tablet (25 mg total) by mouth 3 (three) times daily as needed for dizziness.  Marland Kitchen omeprazole (PRILOSEC) 20 MG capsule TAKE 1 TABLET DAILY  . rosuvastatin (CRESTOR) 10 MG tablet Take 1 tablet (10 mg total) by mouth daily.  . sildenafil (REVATIO) 20 MG tablet Take 20 mg by mouth 2 (two) times daily.  Marland Kitchen SYNTHROID 75 MCG tablet Take 1 tablet (75 mcg total) by mouth daily before breakfast.   No facility-administered encounter medications on file as of 04/20/2018.     Activities of Daily Living In your present state of health, do you have any difficulty performing the following activities: 04/20/2018 07/31/2017  Hearing? Y N  Comment recently has noticed a decline in hearing, will see audiology when needed -  Vision? Y N  Comment  Vision has slightly declined.  Needs cataracts removed, plans to schedule in the next few months -  Difficulty concentrating or making decisions? N N  Walking or climbing stairs? Y N  Comment Back and hip pain -  Dressing or bathing? N N  Doing errands, shopping? N -  Conservation officer, nature  and eating ? N -  Comment Husband does most of the cooking -  Using the Toilet? N -  In the past six months, have you accidently leaked urine? N -  Do you have problems with loss of bowel control? Y -  Comment Occassional trouble holding bowels, has appiontment with Dr. Watt Climes in November -  Managing your Medications? N -  Managing your Finances? N -  Housekeeping or managing your Housekeeping? N -  Some recent data might be hidden    Patient Care Team: [name], FNP as PCP - General (Nurse Practitioner) Clarene Essex, MD as Consulting Physician (Gastroenterology) Philemon Kingdom, MD as Consulting Physician (Internal Medicine) Hennie Duos, MD as Consulting Physician (Rheumatology) Peggyann Juba, MD as Referring Physician (Orthopedic Surgery) Artis Delay, MD as Referring Physician (Internal Medicine) Justice Britain, MD as Consulting Physician (Orthopedic Surgery) Sanjuana Kava, MD as Consulting Physician (Orthopedic Surgery) Barrett, Felisa Bonier as Physician Assistant (Cardiology)    Assessment:   This is a routine wellness examination for Rachele.  Exercise Activities and Dietary recommendations    Mrs. Abrigo states she eats 3 meals per day.  Usually toast, donut, or oatmeal and coffee for breakfast, protein shake and yogurt for lunch, and vegetables, and possibly a protein at supper.  Patient states she does not have a large appetite, which is why she drinks a protein shake for lunch.  She also does not enjoy eating very much meat.    Discussed other options for getting protein being eggs, nuts, beans.    Mrs. Graziosi- walks 30 minutes per day 5-6 days per week.  Intensity is  mild.    Goals    . Exercise 3x per week (30 min per time)     Walk daily as tolerated    . Patient Stated (pt-stated)     Patient's goal is to complete medical evaluations by Dr. Watt Climes for abdominal pain and diarrhea, and have cataracts removed in the next few months.       Fall Risk Fall Risk  04/20/2018 04/08/2018 12/31/2017 11/05/2017 10/01/2017  Falls in the past year? No No Yes No No  Number falls in past yr: - - 1 - -  Injury with Fall? - - No - -  Comment - - - - -  Risk Factor Category  - - - - -  Risk for fall due to : - - - - -  Follow up - - - - -  Comment - - - - -   Is the patient's home free of loose throw rugs in walkways, pet beds, electrical cords, etc?   yes      Grab bars in the bathroom? yes      Handrails on the stairs?   yes      Adequate lighting?   yes    Depression Screen PHQ 2/9 Scores 04/20/2018 04/08/2018 12/31/2017 11/05/2017  PHQ - 2 Score - 0 0 0  PHQ- 9 Score - - - -  Exception Documentation Patient refusal - - -     Cognitive Function MMSE - Mini Mental State Exam 04/20/2018 03/26/2017 12/08/2014  Orientation to time 5 5 5   Orientation to Place 5 5 5   Registration 3 3 3   Attention/ Calculation 5 5 5   Recall 1 0 3  Language- name 2 objects 2 2 2   Language- repeat 1 1 1   Language- follow 3 step command 3 3 3   Language- read & follow direction 1 1 1  Write a sentence 1 1 1   Copy design 1 0 1  Total score 28 26 30         Immunization History  Administered Date(s) Administered  . Influenza, High Dose Seasonal PF 04/08/2018  . Influenza,inj,Quad PF,6+ Mos 04/06/2013, 04/19/2014, 04/19/2015, 03/25/2016, 04/02/2017  . Pneumococcal Conjugate-13 06/02/2014  . Pneumococcal Polysaccharide-23 06/25/2011  . Tdap 07/07/2005, 03/26/2017  . Zoster 08/06/2009  . Zoster Recombinat (Shingrix) 04/20/2018    Qualifies for Shingles Vaccine? Given today  Screening Tests Health Maintenance  Topic Date Due  . COLONOSCOPY  06/23/2018  . DEXA SCAN   10/10/2018  . TETANUS/TDAP  03/27/2027  . INFLUENZA VACCINE  Completed  . PNA vac Low Risk Adult  Completed    Cancer Screenings: Lung: Low Dose CT Chest recommended if Age 36-80 years, 30 pack-year currently smoking OR have quit w/in 15years. Patient does not qualify. Breast:  Up to date on Mammogram? No, patient want to discuss with [name], FNP regarding whether she should have mammogram.  Up to date of Bone Density/Dexa? Yes Colorectal: yes- patient has appointment with Dr. Watt Climes for chronic abdominal pain and diarrhea- 05/2018 she will set up colonoscopy appointment at that time.  Additional Screenings:  Hepatitis C Screening:  Not indicated     Plan:     Work on your goal of completing your medical evaluations with Dr. Watt Climes, Dr. Joneen Boers, and ENT if needed.  Review the information on Advance Directives, and if you complete these please bring a copy to our office to be filed in your medical record.  You received your 1st Shingrix (Shingles) vaccine today, you will need the 2nd dose after 06/20/18.  Follow up with [name], FNP as scheduled.    I have personally reviewed and noted the following in the patient's chart:   . Medical and social history . Use of alcohol, tobacco or illicit drugs  . Current medications and supplements . Functional ability and status . Nutritional status . Physical activity . Advanced directives . List of other physicians . Hospitalizations, surgeries, and ER visits in previous 12 months . Vitals . Screenings to include cognitive, depression, and falls . Referrals and appointments  In addition, I have reviewed and discussed with patient certain preventive protocols, quality metrics, and best practice recommendations. A written personalized care plan for preventive services as well as general preventive health recommendations were provided to patient.     Kilah Drahos M, RN  04/20/2018   I have reviewed and agree with  the above AWV documentation.   Evelina Dun, FNP

## 2018-04-21 NOTE — Telephone Encounter (Signed)
Patient brought Prolia in yesterday and we have in it our refrigerator.  She is going to come in about 2 weeks to have the injection. She is waiting since she just had a shingles vaccine.   Scheduled for 05/05/18

## 2018-05-05 ENCOUNTER — Ambulatory Visit (INDEPENDENT_AMBULATORY_CARE_PROVIDER_SITE_OTHER): Payer: Medicare Other | Admitting: *Deleted

## 2018-05-05 DIAGNOSIS — M810 Age-related osteoporosis without current pathological fracture: Secondary | ICD-10-CM

## 2018-05-05 MED ORDER — DENOSUMAB 60 MG/ML ~~LOC~~ SOSY
60.0000 mg | PREFILLED_SYRINGE | SUBCUTANEOUS | Status: AC
  Administered 2018-05-05 – 2018-11-05 (×2): 60 mg via SUBCUTANEOUS

## 2018-05-05 NOTE — Progress Notes (Signed)
Pt given Prolia inj Tolerated well Pt supplied medication

## 2018-05-11 ENCOUNTER — Ambulatory Visit (INDEPENDENT_AMBULATORY_CARE_PROVIDER_SITE_OTHER): Payer: Medicare Other | Admitting: Family Medicine

## 2018-05-11 ENCOUNTER — Encounter: Payer: Self-pay | Admitting: Family Medicine

## 2018-05-11 VITALS — BP 144/69 | HR 84 | Temp 99.1°F | Ht 62.0 in | Wt 117.0 lb

## 2018-05-11 DIAGNOSIS — J0111 Acute recurrent frontal sinusitis: Secondary | ICD-10-CM

## 2018-05-11 DIAGNOSIS — K529 Noninfective gastroenteritis and colitis, unspecified: Secondary | ICD-10-CM | POA: Diagnosis not present

## 2018-05-11 DIAGNOSIS — J069 Acute upper respiratory infection, unspecified: Secondary | ICD-10-CM | POA: Diagnosis not present

## 2018-05-11 MED ORDER — BENZONATATE 100 MG PO CAPS: 200.0000 mg | ORAL_CAPSULE | Freq: Three times a day (TID) | ORAL | 3 refills | Status: AC | PRN

## 2018-05-11 MED ORDER — FLUTICASONE PROPIONATE 50 MCG/ACT NA SUSP: 2.0000 | Freq: Every day | NASAL | 6 refills | Status: DC

## 2018-05-11 MED ORDER — METHYLPREDNISOLONE ACETATE 40 MG/ML IJ SUSP
40.0000 mg | Freq: Once | INTRAMUSCULAR | Status: AC
  Administered 2018-05-11: 40 mg via INTRAMUSCULAR

## 2018-05-11 MED ORDER — AMOXICILLIN-POT CLAVULANATE 875-125 MG PO TABS: 1.0000 | ORAL_TABLET | Freq: Two times a day (BID) | ORAL | 0 refills | Status: DC

## 2018-05-11 NOTE — Patient Instructions (Signed)
Upper Respiratory Infection, Adult Most upper respiratory infections (URIs) are caused by a virus. A URI affects the nose, throat, and upper air passages. The most common type of URI is often called "the common cold." Follow these instructions at home:  Take medicines only as told by your doctor.  Gargle warm saltwater or take cough drops to comfort your throat as told by your doctor.  Use a warm mist humidifier or inhale steam from a shower to increase air moisture. This may make it easier to breathe.  Drink enough fluid to keep your pee (urine) clear or pale yellow.  Eat soups and other clear broths.  Have a healthy diet.  Rest as needed.  Go back to work when your fever is gone or your doctor says it is okay. ? You may need to stay home longer to avoid giving your URI to others. ? You can also wear a face mask and wash your hands often to prevent spread of the virus.  Use your inhaler more if you have asthma.  Do not use any tobacco products, including cigarettes, chewing tobacco, or electronic cigarettes. If you need help quitting, ask your doctor. Contact a doctor if:  You are getting worse, not better.  Your symptoms are not helped by medicine.  You have chills.  You are getting more short of breath.  You have brown or red mucus.  You have yellow or brown discharge from your nose.  You have pain in your face, especially when you bend forward.  You have a fever.  You have puffy (swollen) neck glands.  You have pain while swallowing.  You have white areas in the back of your throat. Get help right away if:  You have very bad or constant: ? Headache. ? Ear pain. ? Pain in your forehead, behind your eyes, and over your cheekbones (sinus pain). ? Chest pain.  You have long-lasting (chronic) lung disease and any of the following: ? Wheezing. ? Long-lasting cough. ? Coughing up blood. ? A change in your usual mucus.  You have a stiff neck.  You have  changes in your: ? Vision. ? Hearing. ? Thinking. ? Mood. This information is not intended to replace advice given to you by your health care provider. Make sure you discuss any questions you have with your health care provider. Document Released: 12/10/2007 Document Revised: 02/24/2016 Document Reviewed: 09/28/2013 Elsevier Interactive Patient Education  2018 [employer]. Sinusitis, Adult Sinusitis is soreness and inflammation of your sinuses. Sinuses are hollow spaces in the bones around your face. They are located:  Around your eyes.  In the middle of your forehead.  Behind your nose.  In your cheekbones.  Your sinuses and nasal passages are lined with a stringy fluid (mucus). Mucus normally drains out of your sinuses. When your nasal tissues get inflamed or swollen, the mucus can get trapped or blocked so air cannot flow through your sinuses. This lets bacteria, viruses, and funguses grow, and that leads to infection. Follow these instructions at home: Medicines  Take, use, or apply over-the-counter and prescription medicines only as told by your doctor. These may include nasal sprays.  If you were prescribed an antibiotic medicine, take it as told by your doctor. Do not stop taking the antibiotic even if you start to feel better. Hydrate and Humidify  Drink enough water to keep your pee (urine) clear or pale yellow.  Use a cool mist humidifier to keep the humidity level in your home above  50%.  Breathe in steam for 10-15 minutes, 3-4 times a day or as told by your doctor. You can do this in the bathroom while a hot shower is running.  Try not to spend time in cool or dry air. Rest  Rest as much as possible.  Sleep with your head raised (elevated).  Make sure to get enough sleep each night. General instructions  Put a warm, moist washcloth on your face 3-4 times a day or as told by your doctor. This will help with discomfort.  Wash your hands often with soap and  water. If there is no soap and water, use hand sanitizer.  Do not smoke. Avoid being around people who are smoking (secondhand smoke).  Keep all follow-up visits as told by your doctor. This is important. Contact a doctor if:  You have a fever.  Your symptoms get worse.  Your symptoms do not get better within 10 days. Get help right away if:  You have a very bad headache.  You cannot stop throwing up (vomiting).  You have pain or swelling around your face or eyes.  You have trouble seeing.  You feel confused.  Your neck is stiff.  You have trouble breathing. This information is not intended to replace advice given to you by your health care provider. Make sure you discuss any questions you have with your health care provider. Document Released: 12/10/2007 Document Revised: 02/17/2016 Document Reviewed: 04/18/2015 Elsevier Interactive Patient Education  [employer].

## 2018-05-11 NOTE — Progress Notes (Signed)
Subjective:    Patient ID: Haley Trevino, female    DOB: 1940-12-20, 77 y.o.   MRN: [id number]  Chief Complaint:  Fever, congestion, cough, wheezing (went to GI earlier today and they recommended she be seen because of fever)   HPI: Haley Trevino is a 77 y.o. female presenting on 05/11/2018 for Fever, congestion, cough, wheezing (went to GI earlier today and they recommended she be seen because of fever)  Pt presents today with complaints of cough, congestion, headache, ear pressure, eye pressure, fever, and chills. States this has been ongoing for  2 days. States he was told by GI today that she should see here PCP. Pt states she has been taking tessalon perles with relief of the cough, but is out. States she has not taken anything for her fever or the pain. States the headache and eye pressure is worse with bending forward.   Relevant past medical, surgical, family, and social history reviewed and updated as indicated.  Allergies and medications reviewed and updated.   Past Medical History:  Diagnosis Date  . Cataract   . Dyspnea   . Osteoarthritis   . Raynaud disease   . Scleroderma ([place])   . Thyroid disease    hypothyroidism    Past Surgical History:  Procedure Laterality Date  . BREAST ENHANCEMENT SURGERY  1975  . BREAST IMPLANT REMOVAL Bilateral 04/23/2016   Procedure: REMOVALBILATERAL BREAST IMPLANTS;  Surgeon: Wallace Going, DO;  Location: [facility];  Service: Plastics;  Laterality: Bilateral;  . CHOLECYSTECTOMY  1973  . HAND SURGERY  08/2012; 11/2012  . IR RADIOLOGIST EVAL & MGMT  07/27/2017  . IR SACROPLASTY BILATERAL  07/31/2017  . MELANOMA EXCISION     at 47 yrs of age  . ORIF HIP FRACTURE Right 02/22/2014   Procedure: OPEN REDUCTION INTERNAL FIXATION RIGHT HIP;  Surgeon: Sanjuana Kava, MD;  Location: [facility];  Service: Orthopedics;  Laterality: Right;  . TOTAL ABDOMINAL HYSTERECTOMY  1974    Social History   Socioeconomic History   . Marital status: Married    Spouse name: Not on file  . Number of children: Not on file  . Years of education: Not on file  . Highest education level: Not on file  Occupational History  . Occupation: retired Occupational psychologist at [employer]  . Financial resource strain: Not on file  . Food insecurity:    Worry: Not on file    Inability: Not on file  . Transportation needs:    Medical: Not on file    Non-medical: Not on file  Tobacco Use  . Smoking status: Never Smoker  . Smokeless tobacco: Never Used  . Tobacco comment: passive tobacco smoke exposure as child  Substance and Sexual Activity  . Alcohol use: No  . Drug use: No  . Sexual activity: Yes  Lifestyle  . Physical activity:    Days per week: Not on file    Minutes per session: Not on file  . Stress: Not on file  Relationships  . Social connections:    Talks on phone: Not on file    Gets together: Not on file    Attends religious service: Not on file    Active member of club or organization: Not on file    Attends meetings of clubs or organizations: Not on file    Relationship status: Not on file  . Intimate partner violence:    Fear of current or  ex partner: Not on file    Emotionally abused: Not on file    Physically abused: Not on file    Forced sexual activity: Not on file  Other Topics Concern  . Not on file  Social History Narrative   Regular exercise: walking   Caffeine use: 1 cup of coffee daily    Outpatient Encounter Medications as of 05/11/2018  Medication Sig  . amLODipine (NORVASC) 5 MG tablet Take 1 tablet (5 mg total) by mouth daily.  . Cholecalciferol (VITAMIN D3) 2000 units TABS Take 2,000 Units by mouth daily.  . clonazePAM (KLONOPIN) 0.5 MG tablet Take 1 tablet (0.5 mg total) by mouth 2 (two) times daily.  . Ferrous Sulfate Dried (SLOW RELEASE IRON) 45 MG TBCR Take 45 mg by mouth daily.  Marland Kitchen FLUoxetine (PROZAC) 40 MG capsule Take 1 capsule (40 mg total) by mouth daily.  .  meclizine (ANTIVERT) 25 MG tablet Take 1 tablet (25 mg total) by mouth 3 (three) times daily as needed for dizziness.  Marland Kitchen omeprazole (PRILOSEC) 20 MG capsule TAKE 1 TABLET DAILY  . rosuvastatin (CRESTOR) 10 MG tablet Take 1 tablet (10 mg total) by mouth daily.  . sildenafil (REVATIO) 20 MG tablet Take 20 mg by mouth 2 (two) times daily.  Marland Kitchen SYNTHROID 75 MCG tablet Take 1 tablet (75 mcg total) by mouth daily before breakfast.  . amoxicillin-clavulanate (AUGMENTIN) 875-125 MG tablet Take 1 tablet by mouth 2 (two) times daily for 7 days.  . benzonatate (TESSALON PERLES) 100 MG capsule Take 2 capsules (200 mg total) by mouth 3 (three) times daily as needed for cough.  . fluticasone (FLONASE) 50 MCG/ACT nasal spray Place 2 sprays into both nostrils daily.   Facility-Administered Encounter Medications as of 05/11/2018  Medication  . denosumab (PROLIA) injection 60 mg  . methylPREDNISolone acetate (DEPO-MEDROL) injection 40 mg    Allergies  Allergen Reactions  . Codeine Shortness Of Breath    REACTION: dyspnea  . Dilaudid [Hydromorphone Hcl] Anaphylaxis  . Hydromorphone Anaphylaxis  . Acyclovir And Related   . Gabapentin Swelling    Swelling in feet, and in legs  . Aleve [Naproxen Sodium] Rash  . Lyrica [Pregabalin] Other (See Comments)    Swelling in feet, and in legs    Review of Systems  Constitutional: Positive for chills, fatigue and fever.  HENT: Positive for congestion, ear pain (pressure), postnasal drip and sinus pressure.   Eyes: Positive for pain.  Respiratory: Positive for cough and wheezing. Negative for chest tightness and shortness of breath.   Cardiovascular: Negative for chest pain and palpitations.  Gastrointestinal: Negative for abdominal pain, diarrhea, nausea and vomiting.  Musculoskeletal: Positive for myalgias.  Neurological: Positive for headaches. Negative for dizziness, weakness and light-headedness.  Psychiatric/Behavioral: Negative for confusion.  All other  systems reviewed and are negative.       Objective:    BP (!) 144/69   Pulse 84   Temp 99.1 F (37.3 C) (Oral)   Ht '5\' 2"'  (1.575 m)   Wt 117 lb (53.1 kg)   SpO2 97%   BMI 21.40 kg/m    Wt Readings from Last 3 Encounters:  05/11/18 117 lb (53.1 kg)  04/20/18 116 lb (52.6 kg)  04/16/18 114 lb (51.7 kg)    Physical Exam  Constitutional: She is oriented to person, place, and time. She appears well-developed and well-nourished. She is cooperative. She appears ill.  HENT:  Head: Normocephalic and atraumatic.  Right Ear: Hearing, tympanic membrane, external ear  and ear canal normal. Tympanic membrane is not perforated and not erythematous.  Left Ear: Hearing, external ear and ear canal normal. Tympanic membrane is not injected, not perforated and not erythematous. A middle ear effusion is present.  Nose: Mucosal edema and rhinorrhea present. Right sinus exhibits frontal sinus tenderness. Right sinus exhibits no maxillary sinus tenderness. Left sinus exhibits frontal sinus tenderness. Left sinus exhibits no maxillary sinus tenderness.  Mouth/Throat: Uvula is midline and mucous membranes are normal. Posterior oropharyngeal erythema present. No oropharyngeal exudate, posterior oropharyngeal edema or tonsillar abscesses. No tonsillar exudate.  Eyes: Pupils are equal, round, and reactive to light. Conjunctivae, EOM and lids are normal.  Neck: Trachea normal, full passive range of motion without pain and phonation normal. Neck supple. No JVD present. Carotid bruit is not present. No thyroid mass and no thyromegaly present.  Cardiovascular: Normal rate, regular rhythm and normal heart sounds. Exam reveals no gallop and no friction rub.  No murmur heard. Pulmonary/Chest: Effort normal and breath sounds normal. No respiratory distress. She has no wheezes.  Lymphadenopathy:    She has cervical adenopathy (anterior ).  Neurological: She is alert and oriented to person, place, and time.  Skin:  Skin is warm and dry. Capillary refill takes less than 2 seconds.  Psychiatric: She has a normal mood and affect. Her speech is normal and behavior is normal. Judgment normal. Cognition and memory are normal.  Nursing note and vitals reviewed.   Results for orders placed or performed in visit on 04/08/18  CMP14+EGFR  Result Value Ref Range   Glucose 84 65 - 99 mg/dL   BUN 12 8 - 27 mg/dL   Creatinine, Ser 0.89 0.57 - 1.00 mg/dL   GFR calc non Af Amer 63 >59 mL/min/1.73   GFR calc Af Amer 73 >59 mL/min/1.73   BUN/Creatinine Ratio 13 12 - 28   Sodium 141 134 - 144 mmol/L   Potassium 3.7 3.5 - 5.2 mmol/L   Chloride 103 96 - 106 mmol/L   CO2 22 20 - 29 mmol/L   Calcium 9.1 8.7 - 10.3 mg/dL   Total Protein 6.8 6.0 - 8.5 g/dL   Albumin 4.3 3.5 - 4.8 g/dL   Globulin, Total 2.5 1.5 - 4.5 g/dL   Albumin/Globulin Ratio 1.7 1.2 - 2.2   Bilirubin Total <0.2 0.0 - 1.2 mg/dL   Alkaline Phosphatase 59 39 - 117 IU/L   AST 15 0 - 40 IU/L   ALT 8 0 - 32 IU/L  Lipid panel  Result Value Ref Range   Cholesterol, Total 220 (H) 100 - 199 mg/dL   Triglycerides 173 (H) 0 - 149 mg/dL   HDL 47 >39 mg/dL   VLDL Cholesterol Cal 35 5 - 40 mg/dL   LDL Calculated 138 (H) 0 - 99 mg/dL   Chol/HDL Ratio 4.7 (H) 0.0 - 4.4 ratio  Thyroid Panel With TSH  Result Value Ref Range   TSH 1.240 0.450 - 4.500 uIU/mL   T4, Total 9.0 4.5 - 12.0 ug/dL   T3 Uptake Ratio 24 24 - 39 %   Free Thyroxine Index 2.2 1.2 - 4.9       Pertinent labs & imaging results that were available during my care of the patient were reviewed by me and considered in my medical decision making.  Assessment & Plan:  Kaula was seen today for fever, congestion, cough, wheezing.  Diagnoses and all orders for this visit:  Acute recurrent frontal sinusitis Increase water intake. Tylenol as needed  for fever / pain control. Saline nasal spray. Medications as prescribed.  -     amoxicillin-clavulanate (AUGMENTIN) 875-125 MG tablet; Take 1  tablet by mouth 2 (two) times daily for 7 days. -     fluticasone (FLONASE) 50 MCG/ACT nasal spray; Place 2 sprays into both nostrils daily. -     methylPREDNISolone acetate (DEPO-MEDROL) injection 40 mg  URI with cough and congestion Increase humidity in the air. Increase water intake. Tylenol as needed for fever / pain control. Medications as prescribed.  -     benzonatate (TESSALON PERLES) 100 MG capsule; Take 2 capsules (200 mg total) by mouth 3 (three) times daily as needed for cough. -     fluticasone (FLONASE) 50 MCG/ACT nasal spray; Place 2 sprays into both nostrils daily. -     methylPREDNISolone acetate (DEPO-MEDROL) injection 40 mg      Continue all other maintenance medications.  Follow up plan: Return if symptoms worsen or fail to improve.  Educational handout given for sinusitis, URI  The above assessment and management plan was discussed with the patient. The patient verbalized understanding of and has agreed to the management plan. Patient is aware to call the clinic if symptoms persist or worsen. Patient is aware when to return to the clinic for a follow-up visit. Patient educated on when it is appropriate to go to the emergency department.   Monia Pouch, FNP-C [facility] [phone]

## 2018-06-07 ENCOUNTER — Encounter: Payer: Self-pay | Admitting: Nurse Practitioner

## 2018-06-07 ENCOUNTER — Other Ambulatory Visit: Payer: Self-pay | Admitting: Gastroenterology

## 2018-06-07 DIAGNOSIS — K529 Noninfective gastroenteritis and colitis, unspecified: Secondary | ICD-10-CM | POA: Diagnosis not present

## 2018-06-07 DIAGNOSIS — R634 Abnormal weight loss: Secondary | ICD-10-CM

## 2018-06-10 ENCOUNTER — Ambulatory Visit
Admission: RE | Admit: 2018-06-10 | Discharge: 2018-06-10 | Disposition: A | Discharge status: Home / Self Care | Payer: Medicare Other | Source: Ambulatory Visit | Attending: Gastroenterology | Admitting: Gastroenterology

## 2018-06-10 DIAGNOSIS — R634 Abnormal weight loss: Secondary | ICD-10-CM

## 2018-06-10 DIAGNOSIS — K529 Noninfective gastroenteritis and colitis, unspecified: Secondary | ICD-10-CM

## 2018-06-10 MED ORDER — IOPAMIDOL (ISOVUE-300) INJECTION 61%
100.0000 mL | Freq: Once | INTRAVENOUS | Status: AC | PRN
  Administered 2018-06-10: 100 mL via INTRAVENOUS

## 2018-06-16 ENCOUNTER — Other Ambulatory Visit: Payer: Self-pay | Admitting: Gastroenterology

## 2018-06-16 DIAGNOSIS — N949 Unspecified condition associated with female genital organs and menstrual cycle: Secondary | ICD-10-CM

## 2018-06-21 ENCOUNTER — Ambulatory Visit
Admission: RE | Admit: 2018-06-21 | Discharge: 2018-06-21 | Disposition: A | Discharge status: Home / Self Care | Payer: Medicare Other | Source: Ambulatory Visit | Attending: Gastroenterology | Admitting: Gastroenterology

## 2018-06-21 DIAGNOSIS — N949 Unspecified condition associated with female genital organs and menstrual cycle: Secondary | ICD-10-CM

## 2018-06-21 DIAGNOSIS — N83201 Unspecified ovarian cyst, right side: Secondary | ICD-10-CM | POA: Diagnosis not present

## 2018-06-23 ENCOUNTER — Ambulatory Visit (INDEPENDENT_AMBULATORY_CARE_PROVIDER_SITE_OTHER): Payer: Medicare Other | Admitting: *Deleted

## 2018-06-23 DIAGNOSIS — Z23 Encounter for immunization: Secondary | ICD-10-CM

## 2018-06-23 NOTE — Progress Notes (Signed)
Pt given 2nd shingrix vaccine Tolerated well

## 2018-07-12 ENCOUNTER — Encounter: Payer: Self-pay | Admitting: Nurse Practitioner

## 2018-07-12 ENCOUNTER — Ambulatory Visit (INDEPENDENT_AMBULATORY_CARE_PROVIDER_SITE_OTHER): Payer: Medicare Other | Admitting: Nurse Practitioner

## 2018-07-12 VITALS — BP 140/59 | HR 64 | Temp 97.0°F | Ht 62.0 in | Wt 114.0 lb

## 2018-07-12 DIAGNOSIS — K219 Gastro-esophageal reflux disease without esophagitis: Secondary | ICD-10-CM | POA: Diagnosis not present

## 2018-07-12 DIAGNOSIS — M159 Polyosteoarthritis, unspecified: Secondary | ICD-10-CM | POA: Diagnosis not present

## 2018-07-12 DIAGNOSIS — E785 Hyperlipidemia, unspecified: Secondary | ICD-10-CM

## 2018-07-12 DIAGNOSIS — F3342 Major depressive disorder, recurrent, in full remission: Secondary | ICD-10-CM

## 2018-07-12 DIAGNOSIS — M349 Systemic sclerosis, unspecified: Secondary | ICD-10-CM | POA: Diagnosis not present

## 2018-07-12 DIAGNOSIS — I7301 Raynaud's syndrome with gangrene: Secondary | ICD-10-CM

## 2018-07-12 DIAGNOSIS — M810 Age-related osteoporosis without current pathological fracture: Secondary | ICD-10-CM

## 2018-07-12 DIAGNOSIS — I10 Essential (primary) hypertension: Secondary | ICD-10-CM | POA: Diagnosis not present

## 2018-07-12 DIAGNOSIS — E039 Hypothyroidism, unspecified: Secondary | ICD-10-CM | POA: Diagnosis not present

## 2018-07-12 MED ORDER — SILDENAFIL CITRATE 20 MG PO TABS: 20.0000 mg | ORAL_TABLET | Freq: Two times a day (BID) | ORAL | 3 refills | Status: DC

## 2018-07-12 MED ORDER — FLUOXETINE HCL 40 MG PO CAPS: 40.0000 mg | ORAL_CAPSULE | Freq: Every day | ORAL | 1 refills | Status: DC

## 2018-07-12 MED ORDER — AMLODIPINE BESYLATE 5 MG PO TABS: 5.0000 mg | ORAL_TABLET | Freq: Every day | ORAL | 1 refills | Status: DC

## 2018-07-12 MED ORDER — SYNTHROID 75 MCG PO TABS: 75.0000 ug | ORAL_TABLET | Freq: Every day | ORAL | 1 refills | Status: DC

## 2018-07-12 MED ORDER — ROSUVASTATIN CALCIUM 10 MG PO TABS: 10.0000 mg | ORAL_TABLET | Freq: Every day | ORAL | 1 refills | Status: DC

## 2018-07-12 MED ORDER — CLONAZEPAM 0.5 MG PO TABS: 0.5000 mg | ORAL_TABLET | Freq: Two times a day (BID) | ORAL | 2 refills | Status: DC

## 2018-07-12 MED ORDER — OMEPRAZOLE 20 MG PO CPDR: DELAYED_RELEASE_CAPSULE | ORAL | 1 refills | Status: DC

## 2018-07-12 NOTE — Progress Notes (Addendum)
Subjective:    Patient ID: Haley Trevino, female    DOB: 1941-05-19, 78 y.o.   MRN: [id number]   Chief Complaint: Medical Management of Chronic Issues   HPI:  1. [name] with gangrene ([place])  They are cold and sore- she has an ulcer on her right index finger. The cold weather really makes things worse.  2. Essential hypertension  No c/o chest pain, sob or headache. Does nt check blood pressure at home. BP Readings from Last 3 Encounters:  07/12/18 (!) 140/59  05/11/18 (!) 144/69  04/20/18 (!) 163/65     3. Gastroesophageal reflux disease without esophagitis  Takes daily omeprazole daily which keep symptoms under control.  4. Hypothyroidism, unspecified type  No problems  5. Age-related osteoporosis without current pathological fracture  Last dexascan was done 10/09/16. t score was -3.5. she is now on prolia injections.   6. Osteoarthritis of multiple joints, unspecified osteoarthritis type  Says it is mainly in her back. Has sciatica occasionally. Not going to do anything about it for now.  7. SCLERODERMA  See rheumatology. Has all over body aches.  8. Hyperlipidemia with target LDL less than 100  Tries to watch diet, but does no exercise  9. Recurrent major depressive disorder, in full remission ([place])  Is on prozac and klonopin- combination is working well for her.  10.    Diarrhea           Gi started her on colestipol which has almost completely stopped her           diarrhea.  Outpatient Encounter Medications as of 07/12/2018  Medication Sig  . amLODipine (NORVASC) 5 MG tablet Take 1 tablet (5 mg total) by mouth daily.  . Cholecalciferol (VITAMIN D3) 2000 units TABS Take 2,000 Units by mouth daily.  . clonazePAM (KLONOPIN) 0.5 MG tablet Take 1 tablet (0.5 mg total) by mouth 2 (two) times daily.  . Ferrous Sulfate Dried (SLOW RELEASE IRON) 45 MG TBCR Take 45 mg by mouth daily.  Marland Kitchen FLUoxetine (PROZAC) 40 MG capsule Take 1 capsule (40 mg total) by mouth daily.    Marland Kitchen omeprazole (PRILOSEC) 20 MG capsule TAKE 1 TABLET DAILY  . rosuvastatin (CRESTOR) 10 MG tablet Take 1 tablet (10 mg total) by mouth daily.  . sildenafil (REVATIO) 20 MG tablet Take 20 mg by mouth 2 (two) times daily.  Marland Kitchen SYNTHROID 75 MCG tablet Take 1 tablet (75 mcg total) by mouth daily before breakfast.       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   Review of Systems  Constitutional: Negative for activity change and appetite change.  HENT: Negative.   Eyes: Negative for pain.  Respiratory: Negative for shortness of breath.   Cardiovascular: Negative for chest pain, palpitations and leg swelling.  Gastrointestinal: Negative for abdominal pain.  Endocrine: Negative for polydipsia.  Genitourinary: Negative.   Skin: Negative for rash.  Neurological: Negative for dizziness, weakness and headaches.  Hematological: Does not bruise/bleed easily.  Psychiatric/Behavioral: Negative.   All other systems reviewed and are negative.      Objective:   Physical Exam Vitals signs and nursing note reviewed.  Constitutional:      General: She is not in acute distress.    Appearance: Normal appearance. She is well-developed.  HENT:     Head: Normocephalic.     Nose: Nose normal.  Eyes:     Pupils: Pupils are equal, round,  and reactive to light.  Neck:     Musculoskeletal: Normal range of motion and neck supple.     Vascular: No carotid bruit or JVD.  Cardiovascular:     Rate and Rhythm: Normal rate and regular rhythm.     Heart sounds: Murmur (3/6 systolic murmur) present.  Pulmonary:     Effort: Pulmonary effort is normal. No respiratory distress.     Breath sounds: Normal breath sounds. No wheezing or rales.  Chest:     Chest wall: No tenderness.  Abdominal:     General: Bowel sounds are normal. There is no distension or abdominal bruit.     Palpations: Abdomen is soft. There is no hepatomegaly, splenomegaly, mass  or pulsatile mass.     Tenderness: There is no abdominal tenderness.  Musculoskeletal: Normal range of motion.  Lymphadenopathy:     Cervical: No cervical adenopathy.  Skin:    General: Skin is warm and dry.     Comments: Hands cold and pale with 32mm lesion on lateral side of right index finger.  Neurological:     Mental Status: She is alert and oriented to person, place, and time.     Deep Tendon Reflexes: Reflexes are normal and symmetric.  Psychiatric:        Behavior: Behavior normal.        Thought Content: Thought content normal.        Judgment: Judgment normal.    BP (!) 140/59   Pulse 64   Temp (!) 97 F (36.1 C) (Oral)   Ht 5\' 2"  (1.575 m)   Wt 114 lb (51.7 kg)   BMI 20.85 kg/m         Assessment & Plan:  Haley Trevino comes in today with chief complaint of Medical Management of Chronic Issues   Diagnosis and orders addressed:  1. [name] with gangrene ([place]) Keep hands warm Get back on sildenafil - amLODipine (NORVASC) 5 MG tablet; Take 1 tablet (5 mg total) by mouth daily.  Dispense: 90 tablet; Refill: 1 - sildenafil (REVATIO) 20 MG tablet; Take 1 tablet (20 mg total) by mouth 2 (two) times daily.  Dispense: 60 tablet; Refill: 3  2. Essential hypertension Low sodium diet  3. Gastroesophageal reflux disease without esophagitis Avoid spicy foods Do not eat 2 hours prior to bedtime omeprazole (PRILOSEC) 20 MG capsule; TAKE 1 TABLET DAILY  Dispense: 90 capsule; Refill: 1  4. Hypothyroidism, unspecified type - SYNTHROID 75 MCG tablet; Take 1 tablet (75 mcg total) by mouth daily before breakfast.  Dispense: 90 tablet; Refill: 1  5. Age-related osteoporosis without current pathological fracture Weight bearing exercses  6. Osteoarthritis of multiple joints, unspecified osteoarthritis type Keep follow up with rheumatology  7. SCLERODERMA Keep follow up with rheumatologist  8. Hyperlipidemia with target LDL less than 100 Low fat diet  9.  Recurrent major depressive disorder, in full remission ([place]) Stress management - FLUoxetine (PROZAC) 40 MG capsule; Take 1 capsule (40 mg total) by mouth daily.  Dispense: 90 capsule; Refill: 1 - clonazePAM (KLONOPIN) 0.5 MG tablet; Take 1 tablet (0.5 mg total) by mouth 2 (two) times daily.  Dispense: 60 tablet; Refill: 2 - rosuvastatin (CRESTOR) 10 MG tablet; Take 1 tablet (10 mg total) by mouth daily.  Dispense: 90 tablet; Refill: 1   Labs pending- DR. Watt Climes will send [place] results Health Maintenance reviewed Diet and exercise encouraged  Follow up plan: 3 months   Mary-Margaret Hassell Done, FNP

## 2018-07-19 ENCOUNTER — Telehealth: Payer: Self-pay

## 2018-07-19 DIAGNOSIS — R1900 Intra-abdominal and pelvic swelling, mass and lump, unspecified site: Secondary | ICD-10-CM

## 2018-07-19 NOTE — Telephone Encounter (Signed)
Aware.  Referral in process. 

## 2018-07-19 NOTE — Telephone Encounter (Signed)
Referral made to gyn

## 2018-07-19 NOTE — Telephone Encounter (Signed)
Needs a referral to GYN  GI dr found something in the female region

## 2018-07-26 ENCOUNTER — Encounter: Payer: Medicare Other | Admitting: Obstetrics & Gynecology

## 2018-07-26 ENCOUNTER — Telehealth: Payer: Self-pay | Admitting: Nurse Practitioner

## 2018-07-26 NOTE — Telephone Encounter (Signed)
Patient reports she has had severe joint pain, has stopped medication.  Is also having severe itching.  Would like to see Shelah Lewandowsky, appointment scheduled for 07/27/2018 at 9:15 am.

## 2018-07-27 ENCOUNTER — Encounter: Payer: Self-pay | Admitting: Nurse Practitioner

## 2018-07-27 ENCOUNTER — Ambulatory Visit (INDEPENDENT_AMBULATORY_CARE_PROVIDER_SITE_OTHER): Payer: Medicare Other | Admitting: Nurse Practitioner

## 2018-07-27 VITALS — BP 131/62 | HR 78 | Temp 98.9°F | Ht 62.0 in | Wt 116.6 lb

## 2018-07-27 DIAGNOSIS — M791 Myalgia, unspecified site: Secondary | ICD-10-CM

## 2018-07-27 MED ORDER — TRAMADOL HCL 50 MG PO TABS: 50.0000 mg | ORAL_TABLET | Freq: Three times a day (TID) | ORAL | 0 refills | Status: DC | PRN

## 2018-07-27 NOTE — Patient Instructions (Signed)

## 2018-07-27 NOTE — Progress Notes (Signed)
   Subjective:    Patient ID: Haley Trevino, female    DOB: Aug 10, 1940, 78 y.o.   MRN: [id number]   Chief Complaint: Medication Reaction (Patient states since she was taking the crestor and colestipol she has been having bad muscle pains.  Patient has been off of both of them since jan 12th)   HPI Patient comes in c/o of muscles aches that started over a wek ago. Has gradually gotten worse. Any movement of activity will cause pain. She was put on colestipol for diarrhea the first of December so she stopped that thinking that was causing body aches. Aches continued. She also started on crestor early October so she stopped taking that as well. She has had trouble with lipitor and zocor in the past.   Review of Systems  Constitutional: Negative.   Respiratory: Negative.   Cardiovascular: Negative.   Musculoskeletal: Positive for myalgias.  Psychiatric/Behavioral: Negative.        Objective:   Physical Exam Constitutional:      General: She is in acute distress (mild).     Appearance: She is normal weight.  Cardiovascular:     Rate and Rhythm: Normal rate and regular rhythm.     Pulses: Normal pulses.  Pulmonary:     Effort: Pulmonary effort is normal.     Breath sounds: Normal breath sounds.  Skin:    General: Skin is warm and dry.  Neurological:     General: No focal deficit present.     Mental Status: She is alert.  Psychiatric:        Mood and Affect: Mood normal.        Behavior: Behavior normal.     BP 131/62   Pulse 78   Temp 98.9 F (37.2 C) (Oral)   Ht _0  (1.575 m)   Wt 116 lb 9.6 oz (52.9 kg)   BMI 21.33 kg/m        Assessment & Plan:  Haley Trevino in today with chief complaint of Medication Reaction (Patient states since she was taking the crestor and colestipol she has been having bad muscle pains.  Patient has been off of both of them since jan 12th)   1. Myalgia Labs pending Continue to hold crestor Decrease fats in diet -  CMP14+EGFR - CK - CBC with Differential/Platelet  Mary-Margaret Hassell Done, FNP

## 2018-07-28 DIAGNOSIS — M542 Cervicalgia: Secondary | ICD-10-CM | POA: Diagnosis not present

## 2018-07-28 DIAGNOSIS — L98499 Non-pressure chronic ulcer of skin of other sites with unspecified severity: Secondary | ICD-10-CM | POA: Diagnosis not present

## 2018-07-28 DIAGNOSIS — K219 Gastro-esophageal reflux disease without esophagitis: Secondary | ICD-10-CM | POA: Diagnosis not present

## 2018-07-28 DIAGNOSIS — Z6821 Body mass index (BMI) 21.0-21.9, adult: Secondary | ICD-10-CM | POA: Diagnosis not present

## 2018-07-28 DIAGNOSIS — D508 Other iron deficiency anemias: Secondary | ICD-10-CM | POA: Diagnosis not present

## 2018-07-28 DIAGNOSIS — I7300 Raynaud's syndrome without gangrene: Secondary | ICD-10-CM | POA: Diagnosis not present

## 2018-07-28 DIAGNOSIS — M150 Primary generalized (osteo)arthritis: Secondary | ICD-10-CM | POA: Diagnosis not present

## 2018-07-28 DIAGNOSIS — M340 Progressive systemic sclerosis: Secondary | ICD-10-CM | POA: Diagnosis not present

## 2018-07-28 DIAGNOSIS — M810 Age-related osteoporosis without current pathological fracture: Secondary | ICD-10-CM | POA: Diagnosis not present

## 2018-07-28 LAB — CBC WITH DIFFERENTIAL/PLATELET
BASOS ABS: 0 10*3/uL (ref 0.0–0.2)
Basos: 1 %
EOS (ABSOLUTE): 0.3 10*3/uL (ref 0.0–0.4)
Eos: 5 %
Hematocrit: 33.8 % — ABNORMAL LOW (ref 34.0–46.6)
Hemoglobin: 10.8 g/dL — ABNORMAL LOW (ref 11.1–15.9)
Immature Grans (Abs): 0 10*3/uL (ref 0.0–0.1)
Immature Granulocytes: 0 %
Lymphocytes Absolute: 1.2 10*3/uL (ref 0.7–3.1)
Lymphs: 23 %
MCH: 27.9 pg (ref 26.6–33.0)
MCHC: 32 g/dL (ref 31.5–35.7)
MCV: 87 fL (ref 79–97)
Monocytes Absolute: 0.3 10*3/uL (ref 0.1–0.9)
Monocytes: 5 %
Neutrophils Absolute: 3.6 10*3/uL (ref 1.4–7.0)
Neutrophils: 66 %
Platelets: 303 10*3/uL (ref 150–450)
RBC: 3.87 x10E6/uL (ref 3.77–5.28)
RDW: 13.8 % (ref 11.7–15.4)
WBC: 5.4 10*3/uL (ref 3.4–10.8)

## 2018-07-28 LAB — CMP14+EGFR
ALBUMIN: 3.9 g/dL (ref 3.7–4.7)
ALK PHOS: 41 IU/L (ref 39–117)
ALT: 13 IU/L (ref 0–32)
AST: 18 IU/L (ref 0–40)
Albumin/Globulin Ratio: 1.7 (ref 1.2–2.2)
BUN/Creatinine Ratio: 16 (ref 12–28)
BUN: 14 mg/dL (ref 8–27)
Bilirubin Total: <0.2 mg/dL (ref 0.0–1.2)
CHLORIDE: 106 mmol/L (ref 96–106)
CO2: 22 mmol/L (ref 20–29)
Calcium: 8.9 mg/dL (ref 8.7–10.3)
Creatinine, Ser: 0.88 mg/dL (ref 0.57–1.00)
GFR calc Af Amer: 73 mL/min/{1.73_m2} (ref >59)
GFR calc non Af Amer: 64 mL/min/{1.73_m2} (ref >59)
Globulin, Total: 2.3 g/dL (ref 1.5–4.5)
Glucose: 98 mg/dL (ref 65–99)
Potassium: 4.3 mmol/L (ref 3.5–5.2)
Sodium: 137 mmol/L (ref 134–144)
Total Protein: 6.2 g/dL (ref 6.0–8.5)

## 2018-07-28 LAB — CK: Total CK: 60 U/L (ref 24–173)

## 2018-08-10 ENCOUNTER — Ambulatory Visit (INDEPENDENT_AMBULATORY_CARE_PROVIDER_SITE_OTHER): Payer: Medicare Other | Admitting: Obstetrics & Gynecology

## 2018-08-10 ENCOUNTER — Other Ambulatory Visit: Payer: Self-pay

## 2018-08-10 ENCOUNTER — Encounter: Payer: Self-pay | Admitting: Obstetrics & Gynecology

## 2018-08-10 DIAGNOSIS — C561 Malignant neoplasm of right ovary: Secondary | ICD-10-CM | POA: Diagnosis not present

## 2018-08-10 DIAGNOSIS — N83201 Unspecified ovarian cyst, right side: Secondary | ICD-10-CM | POA: Diagnosis not present

## 2018-08-10 NOTE — Progress Notes (Signed)
Chief Complaint  Patient presents with  . Referral    cyst on CT      78 y.o. G3P0011 No LMP recorded. Patient has had a hysterectomy. The current method of family planning is status post hysterectomy.  Outpatient Encounter Medications as of 08/10/2018  Medication Sig  . amLODipine (NORVASC) 5 MG tablet Take 1 tablet (5 mg total) by mouth daily. (Patient taking differently: Take 10 mg by mouth daily. )  . Cholecalciferol (VITAMIN D3) 2000 units TABS Take 2,000 Units by mouth daily.  . clonazePAM (KLONOPIN) 0.5 MG tablet Take 1 tablet (0.5 mg total) by mouth 2 (two) times daily.  . Ferrous Sulfate Dried (SLOW RELEASE IRON) 45 MG TBCR Take 45 mg by mouth daily.  Marland Kitchen FLUoxetine (PROZAC) 40 MG capsule Take 1 capsule (40 mg total) by mouth daily.  Marland Kitchen omeprazole (PRILOSEC) 20 MG capsule TAKE 1 TABLET DAILY  . sildenafil (REVATIO) 20 MG tablet Take 1 tablet (20 mg total) by mouth 2 (two) times daily.  Marland Kitchen SYNTHROID 75 MCG tablet Take 1 tablet (75 mcg total) by mouth daily before breakfast.  . [DISCONTINUED] traMADol (ULTRAM) 50 MG tablet Take 1 tablet (50 mg total) by mouth every 8 (eight) hours as needed.   Facility-Administered Encounter Medications as of 08/10/2018  Medication  . denosumab (PROLIA) injection 60 mg    Subjective Pt had a screening CT scan and a mass was found on the right ovary Pt is assymptomatic, there was no other evidence of malignancuy of the ovary, no ascites or omental cake or other findings Most likely this has been present for some time Past Medical History:  Diagnosis Date  . Cataract   . Dyspnea   . Osteoarthritis   . Raynaud disease   . Scleroderma ([place])   . Thyroid disease    hypothyroidism    Past Surgical History:  Procedure Laterality Date  . BREAST ENHANCEMENT SURGERY  1975  . BREAST IMPLANT REMOVAL Bilateral 04/23/2016   Procedure: REMOVALBILATERAL BREAST IMPLANTS;  Surgeon: Wallace Going, DO;  Location: [place];   Service: Plastics;  Laterality: Bilateral;  . CHOLECYSTECTOMY  1973  . HAND SURGERY  08/2012; 11/2012  . IR RADIOLOGIST EVAL & MGMT  07/27/2017  . IR SACROPLASTY BILATERAL  07/31/2017  . MELANOMA EXCISION     at 61 yrs of age  . ORIF HIP FRACTURE Right 02/22/2014   Procedure: OPEN REDUCTION INTERNAL FIXATION RIGHT HIP;  Surgeon: Sanjuana Kava, MD;  Location: [facility];  Service: Orthopedics;  Laterality: Right;  . TOTAL ABDOMINAL HYSTERECTOMY  1974    OB History    Gravida  3   Para  2   Term      Preterm      AB  1   Living  1     SAB  1   TAB      Ectopic      Multiple      Live Births              Allergies  Allergen Reactions  . Codeine Shortness Of Breath    REACTION: dyspnea  . Dilaudid [Hydromorphone Hcl] Anaphylaxis  . Hydromorphone Anaphylaxis  . Acyclovir And Related   . Crestor [Rosuvastatin Calcium] Other (See Comments)    Muscle pain  . Gabapentin Swelling    Swelling in feet, and in legs  . Aleve [Naproxen Sodium] Rash  . Lyrica [Pregabalin] Other (See Comments)    Swelling  in feet, and in legs    Social History   Socioeconomic History  . Marital status: Married    Spouse name: Not on file  . Number of children: Not on file  . Years of education: Not on file  . Highest education level: Not on file  Occupational History  . Occupation: retired Occupational psychologist at [employer]  . Financial resource strain: Not on file  . Food insecurity:    Worry: Not on file    Inability: Not on file  . Transportation needs:    Medical: Not on file    Non-medical: Not on file  Tobacco Use  . Smoking status: Never Smoker  . Smokeless tobacco: Never Used  . Tobacco comment: passive tobacco smoke exposure as child  Substance and Sexual Activity  . Alcohol use: No  . Drug use: No  . Sexual activity: Not Currently    Birth control/protection: Surgical  Lifestyle  . Physical activity:    Days per week: Not on file    Minutes per session: Not  on file  . Stress: Not on file  Relationships  . Social connections:    Talks on phone: Not on file    Gets together: Not on file    Attends religious service: Not on file    Active member of club or organization: Not on file    Attends meetings of clubs or organizations: Not on file    Relationship status: Not on file  Other Topics Concern  . Not on file  Social History Narrative   Regular exercise: walking   Caffeine use: 1 cup of coffee daily    Family History  Problem Relation Age of Onset  . Pancreatic cancer Father   . Raynaud syndrome Father   . Cancer Father        pancreatic  . Rashes / Skin problems Daughter        possibly scleraderma  . Hip fracture Mother   . Mental illness Mother        attempted suicide at 72 yo    Medications:       Current Outpatient Medications:  .  amLODipine (NORVASC) 5 MG tablet, Take 1 tablet (5 mg total) by mouth daily. (Patient taking differently: Take 10 mg by mouth daily. ), Disp: 90 tablet, Rfl: 1 .  Cholecalciferol (VITAMIN D3) 2000 units TABS, Take 2,000 Units by mouth daily., Disp: , Rfl:  .  clonazePAM (KLONOPIN) 0.5 MG tablet, Take 1 tablet (0.5 mg total) by mouth 2 (two) times daily., Disp: 60 tablet, Rfl: 2 .  Ferrous Sulfate Dried (SLOW RELEASE IRON) 45 MG TBCR, Take 45 mg by mouth daily., Disp: , Rfl:  .  FLUoxetine (PROZAC) 40 MG capsule, Take 1 capsule (40 mg total) by mouth daily., Disp: 90 capsule, Rfl: 1 .  omeprazole (PRILOSEC) 20 MG capsule, TAKE 1 TABLET DAILY, Disp: 90 capsule, Rfl: 1 .  sildenafil (REVATIO) 20 MG tablet, Take 1 tablet (20 mg total) by mouth 2 (two) times daily., Disp: 60 tablet, Rfl: 3 .  SYNTHROID 75 MCG tablet, Take 1 tablet (75 mcg total) by mouth daily before breakfast., Disp: 90 tablet, Rfl: 1  Current Facility-Administered Medications:  .  denosumab (PROLIA) injection 60 mg, 60 mg, Subcutaneous, Q6 months, Martin, Mary-Margaret, FNP, 60 mg at 05/05/18 1549  Objective There were no vitals  taken for this visit.  Gen WDWN NAD  Pertinent ROS No burning with urination, frequency or urgency No nausea,  vomiting or diarrhea Nor fever chills or other constitutional symptoms   Labs or studies Reviewed scans    Impression Diagnoses this Encounter::   ICD-10-CM   1. Cyst of right ovary N83.201 US PELVIS (TRANSABDOMINAL ONLY)    US OB Transvaginal  2. Malignant neoplasm of right ovary (HCC) C56.1 CA 125    US PELVIS (TRANSABDOMINAL ONLY)    US OB Transvaginal    Established relevant diagnosis(es):   Plan/Recommendations: No orders of the defined types were placed in this encounter.   Labs or Scans Ordered: Orders Placed This Encounter  Procedures  . US PELVIS (TRANSABDOMINAL ONLY)  . US OB Transvaginal  . CA 125    Management:: >CA 125 today >repeat scain 3 months for stability  Follow up Return in about 3 months (around 11/08/2018) for GYN sono, Follow up, with Dr Elonda Husky.        Face to face time:  20 minutes  Greater than 50% of the visit time was spent in counseling and coordination of care with the patient.  The summary and outline of the counseling and care coordination is summarized in the note above.   All questions were answered.

## 2018-08-11 LAB — CA 125: CANCER ANTIGEN (CA) 125: 11 U/mL (ref 0.0–38.1)

## 2018-08-11 NOTE — Telephone Encounter (Signed)
DOB verified. Informed pt that cancer antigen was normal. Advised to keep follow up appt for 3 months. Pt verbalized understanding.

## 2018-08-19 DIAGNOSIS — H903 Sensorineural hearing loss, bilateral: Secondary | ICD-10-CM | POA: Diagnosis not present

## 2018-10-12 ENCOUNTER — Encounter: Payer: Self-pay | Admitting: Nurse Practitioner

## 2018-10-12 ENCOUNTER — Other Ambulatory Visit: Payer: Self-pay | Admitting: *Deleted

## 2018-10-12 ENCOUNTER — Ambulatory Visit (INDEPENDENT_AMBULATORY_CARE_PROVIDER_SITE_OTHER): Payer: Medicare Other | Admitting: Nurse Practitioner

## 2018-10-12 ENCOUNTER — Other Ambulatory Visit: Payer: Self-pay

## 2018-10-12 DIAGNOSIS — M810 Age-related osteoporosis without current pathological fracture: Secondary | ICD-10-CM | POA: Diagnosis not present

## 2018-10-12 DIAGNOSIS — I10 Essential (primary) hypertension: Secondary | ICD-10-CM

## 2018-10-12 DIAGNOSIS — I7301 Raynaud's syndrome with gangrene: Secondary | ICD-10-CM

## 2018-10-12 DIAGNOSIS — F3342 Major depressive disorder, recurrent, in full remission: Secondary | ICD-10-CM | POA: Diagnosis not present

## 2018-10-12 DIAGNOSIS — K219 Gastro-esophageal reflux disease without esophagitis: Secondary | ICD-10-CM | POA: Diagnosis not present

## 2018-10-12 DIAGNOSIS — M349 Systemic sclerosis, unspecified: Secondary | ICD-10-CM | POA: Diagnosis not present

## 2018-10-12 DIAGNOSIS — E039 Hypothyroidism, unspecified: Secondary | ICD-10-CM | POA: Diagnosis not present

## 2018-10-12 DIAGNOSIS — E785 Hyperlipidemia, unspecified: Secondary | ICD-10-CM | POA: Diagnosis not present

## 2018-10-12 MED ORDER — CLONAZEPAM 0.5 MG PO TABS: 0.5000 mg | ORAL_TABLET | Freq: Two times a day (BID) | ORAL | 2 refills | Status: DC

## 2018-10-12 MED ORDER — AMLODIPINE BESYLATE 10 MG PO TABS: 10.0000 mg | ORAL_TABLET | Freq: Every day | ORAL | 1 refills | Status: DC

## 2018-10-12 MED ORDER — SYNTHROID 75 MCG PO TABS: 75.0000 ug | ORAL_TABLET | Freq: Every day | ORAL | 1 refills | Status: DC

## 2018-10-12 MED ORDER — FLUOXETINE HCL 40 MG PO CAPS: 40.0000 mg | ORAL_CAPSULE | Freq: Every day | ORAL | 1 refills | Status: DC

## 2018-10-12 MED ORDER — DENOSUMAB 60 MG/ML ~~LOC~~ SOSY: 60.0000 mg | PREFILLED_SYRINGE | Freq: Once | SUBCUTANEOUS | 0 refills | Status: AC

## 2018-10-12 MED ORDER — SILDENAFIL CITRATE 20 MG PO TABS: 20.0000 mg | ORAL_TABLET | Freq: Two times a day (BID) | ORAL | 3 refills | Status: DC

## 2018-10-12 MED ORDER — OMEPRAZOLE 20 MG PO CPDR: DELAYED_RELEASE_CAPSULE | ORAL | 1 refills | Status: DC

## 2018-10-12 NOTE — Progress Notes (Signed)
Patient ID: Haley Trevino, female   DOB: Oct 18, 1940, 78 y.o.   MRN: [id number]    Virtual Visit via telephone Note  I connected with Haley Trevino on 10/12/18 at 1:50 PM by telephone and verified that I am speaking with the correct person using two identifiers. Haley Trevino is currently located at home and her husband is currently with her during visit. The provider, Mary-Margaret Hassell Done, FNP is located in their office at time of visit.  I discussed the limitations, risks, security and privacy concerns of performing an evaluation and management service by telephone and the availability of in person appointments. I also discussed with the patient that there may be a patient responsible charge related to this service. The patient expressed understanding and agreed to proceed.   History and Present Illness:   Chief Complaint: No chief complaint on file.   HPI:  1. Essential hypertension  No c/o chest pain, sob or headache. Does not check blood pressure at home. BP Readings from Last 3 Encounters:  07/27/18 131/62  07/12/18 (!) 140/59  05/11/18 (!) 144/69     2. Hypothyroidism, unspecified type  No problems that she is aware of  3. Gastroesophageal reflux disease without esophagitis  Takes omeprazole everyday. Works well to keep symptoms under control.  4. Hyperlipidemia with target LDL less than 100  she tries to watch diet but due to medical conditions she is not able to exercise very much.  5. Recurrent major depressive disorder, in full remission [facility])  Patient is on prozac daily. She has some depression that varies in degrees from day to day. Depression screen [facility] 2/9 10/12/2018 07/27/2018 07/12/2018  Decreased Interest 1 0 0  Down, Depressed, Hopeless 1 0 0  PHQ - 2 Score 2 0 0  Altered sleeping 1 - -  Tired, decreased energy 1 - -  Change in appetite 0 - -  Feeling bad or failure about yourself  0 - -  Trouble concentrating 0 - -  Moving slowly or  fidgety/restless 0 - -  Suicidal thoughts 0 - -  PHQ-9 Score 4 - -  Difficult doing work/chores Somewhat difficult - -  Some recent data might be hidden    an dhe goes out o get waht they need so she can stay at home.  6. [name] with gangrene ([place])  Since weather has warmed up her fingers are a little better. She currently has several ulcerson her finger- worse then they have ever been. DR. Amil Amen is currently treating her with sildenafil to increase circulation.  7. SCLERODERMA  She is doing ok.   8. Age-related osteoporosis without current pathological fracture  Last dexascan was done 10/2016 with tscore of -3.5        New complaints: None today  Social history: Lives with her husband goes out and gets there groceries so she can stay home.   Review of Systems  Constitutional: Negative for diaphoresis and weight loss.  Eyes: Negative for blurred vision, double vision and pain.  Respiratory: Negative for shortness of breath.   Cardiovascular: Negative for chest pain, palpitations, orthopnea and leg swelling.  Gastrointestinal: Negative for abdominal pain.  Musculoskeletal: Positive for joint pain.  Skin: Negative for rash.       Multiple lesion on finger- right hand worse then left  Neurological: Negative for dizziness, sensory change, loss of consciousness, weakness and headaches.  Endo/Heme/Allergies: Negative for polydipsia. Does not bruise/bleed easily.  Psychiatric/Behavioral: Negative for memory loss. The patient  does not have insomnia.   All other systems reviewed and are negative.     Observations/Objective: Alert and oriented- answers all questions appropriately  Assessment and Plan: Haley Trevino comes in today with chief complaint of No chief complaint on file.   Diagnosis and orders addressed:  1. Essential hypertension Low sodium diet - amLODipine (NORVASC) 10 MG tablet; Take 1 tablet (10 mg total) by mouth daily.  Dispense: 90  tablet; Refill: 1  2. Hypothyroidism, unspecified type - SYNTHROID 75 MCG tablet; Take 1 tablet (75 mcg total) by mouth daily before breakfast.  Dispense: 90 tablet; Refill: 1  3. Gastroesophageal reflux disease without esophagitis Avoid spicy foods Do not eat 2 hours prior to bedtime - omeprazole (PRILOSEC) 20 MG capsule; TAKE 1 TABLET DAILY  Dispense: 90 capsule; Refill: 1  4. Hyperlipidemia with target LDL less than 100 Low fat diet  5. Recurrent major depressive disorder, in full remission ([place]) Stress management - FLUoxetine (PROZAC) 40 MG capsule; Take 1 capsule (40 mg total) by mouth daily.  Dispense: 90 capsule; Refill: 1 - clonazePAM (KLONOPIN) 0.5 MG tablet; Take 1 tablet (0.5 mg total) by mouth 2 (two) times daily.  Dispense: 60 tablet; Refill: 2  6. [name] with gangrene ([place]) Keep hands warm - sildenafil (REVATIO) 20 MG tablet; Take 1 tablet (20 mg total) by mouth 2 (two) times daily.  Dispense: 60 tablet; Refill: 3  7. SCLERODERMA Continue follow up with dr. Amil Amen  8. Age-related osteoporosis without current pathological fracture Weight bearing exercises as can tolerate   Previous labs reviewed Health Maintenance reviewed Diet and exercise encouraged  Follow up plan: 3 months     I discussed the assessment and treatment plan with the patient. The patient was provided an opportunity to ask questions and all were answered. The patient agreed with the plan and demonstrated an understanding of the instructions.   The patient was advised to call back or seek an in-person evaluation if the symptoms worsen or if the condition fails to improve as anticipated.  The above assessment and management plan was discussed with the patient. The patient verbalized understanding of and has agreed to the management plan. Patient is aware to call the clinic if symptoms persist or worsen. Patient is aware when to return to the clinic for a follow-up visit. Patient  educated on when it is appropriate to go to the emergency department.    I provided 15 minutes of non-face-to-face time during this encounter.    Mary-Margaret Hassell Done, FNP

## 2018-10-13 NOTE — Progress Notes (Signed)
Patient due for Prolia injection 11/05/2018.  She qualifies for Medicare Extra Help and gets Prolia from [facility] for $8.95.  Verified this with [facility].  They will deliver to our office, and we will store for patient in the refrigerator here until she comes in for injection on 11/05/2018.  Patient agreeable.

## 2018-11-01 DIAGNOSIS — M810 Age-related osteoporosis without current pathological fracture: Secondary | ICD-10-CM | POA: Diagnosis not present

## 2018-11-01 DIAGNOSIS — K219 Gastro-esophageal reflux disease without esophagitis: Secondary | ICD-10-CM | POA: Diagnosis not present

## 2018-11-01 DIAGNOSIS — I7300 Raynaud's syndrome without gangrene: Secondary | ICD-10-CM | POA: Diagnosis not present

## 2018-11-01 DIAGNOSIS — D508 Other iron deficiency anemias: Secondary | ICD-10-CM | POA: Diagnosis not present

## 2018-11-01 DIAGNOSIS — M150 Primary generalized (osteo)arthritis: Secondary | ICD-10-CM | POA: Diagnosis not present

## 2018-11-01 DIAGNOSIS — L98499 Non-pressure chronic ulcer of skin of other sites with unspecified severity: Secondary | ICD-10-CM | POA: Diagnosis not present

## 2018-11-01 DIAGNOSIS — M542 Cervicalgia: Secondary | ICD-10-CM | POA: Diagnosis not present

## 2018-11-01 DIAGNOSIS — M340 Progressive systemic sclerosis: Secondary | ICD-10-CM | POA: Diagnosis not present

## 2018-11-05 ENCOUNTER — Other Ambulatory Visit: Payer: Self-pay

## 2018-11-05 ENCOUNTER — Ambulatory Visit (INDEPENDENT_AMBULATORY_CARE_PROVIDER_SITE_OTHER): Payer: Medicare Other | Admitting: *Deleted

## 2018-11-05 DIAGNOSIS — M810 Age-related osteoporosis without current pathological fracture: Secondary | ICD-10-CM

## 2018-11-05 NOTE — Progress Notes (Signed)
Pt given Prolia inj Tolerated well 

## 2018-11-08 ENCOUNTER — Telehealth: Payer: Self-pay | Admitting: *Deleted

## 2018-11-08 ENCOUNTER — Other Ambulatory Visit: Payer: Medicare Other

## 2018-11-08 NOTE — Telephone Encounter (Signed)
Called patient and left message informing her that we are not allowing any visitors or children to come to visit with her at this time. Also requested that if she has had any exposure to anyone suspected or confirmed of having COVID-19 to call to reschedule appointment. Also requested that if she is experiencing any of the following to call and reschedule : fever, cough, sob, muscle pain, diarrhea, rash, vomiting, abdominal pain, red eye, weakness, bruising or bleeding, joint pain, or a severe headache.  

## 2018-11-09 ENCOUNTER — Other Ambulatory Visit: Payer: Self-pay

## 2018-11-09 ENCOUNTER — Ambulatory Visit: Payer: Self-pay | Admitting: Obstetrics & Gynecology

## 2018-11-16 ENCOUNTER — Ambulatory Visit: Payer: Medicare Other | Admitting: Obstetrics & Gynecology

## 2018-11-16 ENCOUNTER — Ambulatory Visit (INDEPENDENT_AMBULATORY_CARE_PROVIDER_SITE_OTHER): Payer: Medicare Other

## 2018-11-16 ENCOUNTER — Other Ambulatory Visit: Payer: Self-pay

## 2018-11-16 DIAGNOSIS — C561 Malignant neoplasm of right ovary: Secondary | ICD-10-CM

## 2018-11-16 DIAGNOSIS — N83201 Unspecified ovarian cyst, right side: Secondary | ICD-10-CM | POA: Diagnosis not present

## 2018-11-16 NOTE — Progress Notes (Signed)
PELVIC US TA/TV:normal vaginal cuff,two simple right ovarian cysts,(#1) 2.7 x 2.4 x 2.6 cm (#2) 1.7 x 1.4 x 1.7 cm,left ovary not visualized because of bowel gas,no free fluid,no pain during ultrasound

## 2018-11-19 ENCOUNTER — Encounter: Payer: Self-pay | Admitting: Obstetrics & Gynecology

## 2018-11-19 ENCOUNTER — Other Ambulatory Visit: Payer: Self-pay

## 2018-11-19 ENCOUNTER — Ambulatory Visit (INDEPENDENT_AMBULATORY_CARE_PROVIDER_SITE_OTHER): Payer: Medicare Other | Admitting: Obstetrics & Gynecology

## 2018-11-19 DIAGNOSIS — N83201 Unspecified ovarian cyst, right side: Secondary | ICD-10-CM

## 2018-11-19 NOTE — Progress Notes (Signed)
Follow up appointment for results  Chief Complaint  Patient presents with  . Follow-up    u/s results    There were no vitals taken for this visit.  GYNECOLOGIC SONOGRAM   Haley Trevino is a 78 y.o. G3P0011 s/p hysterectomy,she is here for a pelvic sonogram to f/u a right ovarian mass.Marland Kitchen  Uterus                     Surgically removed,normal vaginal cuff  Endometrium          n/a  Right ovary             3.9 x 2.4 x 3.8 cm, two simple right ovarian cysts,(#1) 2.7 x 2.4 x 2.6 cm (#2) 1.7 x 1.4 x 1.7 cm,no color flow visualized   Left ovary               Left ovary was not visualized, adnexa wnl  No free fluid   Technician Comments:  PELVIC US TA/TV:normal vaginal cuff,two simple right ovarian cysts,(#1) 2.7 x 2.4 x 2.6 cm (#2) 1.7 x 1.4 x 1.7 cm,left ovary not visualized because of bowel gas,no free fluid,no pain during ultrasound     [employer] 11/16/2018 3:46 PM  Clinical Impression and recommendations:  I have reviewed the sonogram results above.followup 3 month u/s on pt with recent CT showing a small pelvic cyst, and u/s showing a simple ovarian cyst, with reassuring Ca125 of 11.  Combined with the patient's current clinical course, below are my impressions and any appropriate recommendations for management based on the sonographic findings:   1 Stable ovarian right simple cyst, consisting of 2 small cystic areas with single thin septation 0.5 mm thick, no internal flow or solid components.  Total ovarian volume 18.50 cm^3(ml), as compared to estimated volume of 23 cm^3 in December. No enlargement noted. 2. Tiny left ovary not visualized. 3 no free fluid, well healed vaginal cuff.  IMP: Stable postmenopausal ovarian cyst. May consider further f/u in 6-12 months, but generally reassuring findings.  Jonnie Kind 11/18/2018     MEDS ordered this encounter: No orders of the defined types were placed in this encounter.   Orders for this  encounter: No orders of the defined types were placed in this encounter.   Impression:   ICD-10-CM   1. Cyst of right ovary  N83.201      Plan:   Follow Up: Return in about 1 year (around 11/19/2019) for repeat sonogram.       Face to face time:  10 minutes  Greater than 50% of the visit time was spent in counseling and coordination of care with the patient.  The summary and outline of the counseling and care coordination is summarized in the note above.   All questions were answered.  Past Medical History:  Diagnosis Date  . Cataract   . Dyspnea   . Osteoarthritis   . Raynaud disease   . Scleroderma ([place])   . Thyroid disease    hypothyroidism    Past Surgical History:  Procedure Laterality Date  . BREAST ENHANCEMENT SURGERY  1975  . BREAST IMPLANT REMOVAL Bilateral 04/23/2016   Procedure: REMOVALBILATERAL BREAST IMPLANTS;  Surgeon: Wallace Going, DO;  Location: [place];  Service: Plastics;  Laterality: Bilateral;  . CHOLECYSTECTOMY  1973  . HAND SURGERY  08/2012; 11/2012  . IR RADIOLOGIST EVAL & MGMT  07/27/2017  . IR SACROPLASTY BILATERAL  07/31/2017  .  MELANOMA EXCISION     at 89 yrs of age  . ORIF HIP FRACTURE Right 02/22/2014   Procedure: OPEN REDUCTION INTERNAL FIXATION RIGHT HIP;  Surgeon: Sanjuana Kava, MD;  Location[facility];  Service: Orthopedics;  Laterality: Right;  . TOTAL ABDOMINAL HYSTERECTOMY  1974    OB History    Gravida  3   Para  2   Term      Preterm      AB  1   Living  1     SAB  1   TAB      Ectopic      Multiple      Live Births              Allergies  Allergen Reactions  . Codeine Shortness Of Breath    REACTION: dyspnea  . Dilaudid [Hydromorphone Hcl] Anaphylaxis  . Hydromorphone Anaphylaxis  . Acyclovir And Related   . Crestor [Rosuvastatin Calcium] Other (See Comments)    Muscle pain  . Gabapentin Swelling    Swelling in feet, and in legs  . Aleve [Naproxen Sodium] Rash  .  Lyrica [Pregabalin] Other (See Comments)    Swelling in feet, and in legs    Social History   Socioeconomic History  . Marital status: Married    Spouse name: Not on file  . Number of children: Not on file  . Years of education: Not on file  . Highest education level: Not on file  Occupational History  . Occupation: retired Occupational psychologist at [employer]  . Financial resource strain: Not on file  . Food insecurity:    Worry: Not on file    Inability: Not on file  . Transportation needs:    Medical: Not on file    Non-medical: Not on file  Tobacco Use  . Smoking status: Never Smoker  . Smokeless tobacco: Never Used  . Tobacco comment: passive tobacco smoke exposure as child  Substance and Sexual Activity  . Alcohol use: No  . Drug use: No  . Sexual activity: Not Currently    Birth control/protection: Surgical  Lifestyle  . Physical activity:    Days per week: Not on file    Minutes per session: Not on file  . Stress: Not on file  Relationships  . Social connections:    Talks on phone: Not on file    Gets together: Not on file    Attends religious service: Not on file    Active member of club or organization: Not on file    Attends meetings of clubs or organizations: Not on file    Relationship status: Not on file  Other Topics Concern  . Not on file  Social History Narrative   Regular exercise: walking   Caffeine use: 1 cup of coffee daily    Family History  Problem Relation Age of Onset  . Pancreatic cancer Father   . Raynaud syndrome Father   . Cancer Father        pancreatic  . Rashes / Skin problems Daughter        possibly scleraderma  . Hip fracture Mother   . Mental illness Mother        attempted suicide at 40 yo

## 2019-01-12 ENCOUNTER — Other Ambulatory Visit: Payer: Self-pay

## 2019-01-13 ENCOUNTER — Encounter: Payer: Self-pay | Admitting: Nurse Practitioner

## 2019-01-13 ENCOUNTER — Ambulatory Visit (INDEPENDENT_AMBULATORY_CARE_PROVIDER_SITE_OTHER): Payer: Medicare Other | Admitting: Nurse Practitioner

## 2019-01-13 VITALS — BP 154/62 | HR 60 | Temp 98.2°F | Ht 62.0 in | Wt 119.0 lb

## 2019-01-13 DIAGNOSIS — R5383 Other fatigue: Secondary | ICD-10-CM | POA: Diagnosis not present

## 2019-01-13 DIAGNOSIS — F3342 Major depressive disorder, recurrent, in full remission: Secondary | ICD-10-CM | POA: Diagnosis not present

## 2019-01-13 DIAGNOSIS — E039 Hypothyroidism, unspecified: Secondary | ICD-10-CM

## 2019-01-13 DIAGNOSIS — M349 Systemic sclerosis, unspecified: Secondary | ICD-10-CM | POA: Diagnosis not present

## 2019-01-13 DIAGNOSIS — E785 Hyperlipidemia, unspecified: Secondary | ICD-10-CM

## 2019-01-13 DIAGNOSIS — K219 Gastro-esophageal reflux disease without esophagitis: Secondary | ICD-10-CM

## 2019-01-13 DIAGNOSIS — M810 Age-related osteoporosis without current pathological fracture: Secondary | ICD-10-CM | POA: Diagnosis not present

## 2019-01-13 DIAGNOSIS — I7301 Raynaud's syndrome with gangrene: Secondary | ICD-10-CM | POA: Diagnosis not present

## 2019-01-13 DIAGNOSIS — I10 Essential (primary) hypertension: Secondary | ICD-10-CM

## 2019-01-13 MED ORDER — FLUOXETINE HCL 40 MG PO CAPS: 40.0000 mg | ORAL_CAPSULE | Freq: Every day | ORAL | 1 refills | Status: DC

## 2019-01-13 MED ORDER — OMEPRAZOLE 20 MG PO CPDR: DELAYED_RELEASE_CAPSULE | ORAL | 1 refills | Status: DC

## 2019-01-13 MED ORDER — CLONAZEPAM 0.5 MG PO TABS: 0.5000 mg | ORAL_TABLET | Freq: Two times a day (BID) | ORAL | 2 refills | Status: DC

## 2019-01-13 MED ORDER — SYNTHROID 75 MCG PO TABS: 75.0000 ug | ORAL_TABLET | Freq: Every day | ORAL | 1 refills | Status: DC

## 2019-01-13 NOTE — Progress Notes (Signed)
Subjective:    Patient ID: Haley Trevino, female    DOB: [birth year], 78 y.o.   MRN: [id number]   Chief Complaint: Medical Management of Chronic Issues    HPI:  1. Essential hypertension No c/o chest pain, sob or headache. Does not check blood pressure at home. BP Readings from Last 3 Encounters:  07/27/18 131/62  07/12/18 (!) 140/59  05/11/18 (!) 144/69     2. Hyperlipidemia with target LDL less than 100 Watches diet and does very little exercise due to her chronic pain from scleraderma.  3. Hypothyroidism, unspecified type No problems that she is aware of.  4. Gastroesophageal reflux disease without esophagitis Is on omeprazole daily- gets symptomatic if she does not take.  5. SCLERODERMA See rheumatology every 3 months. They have made no changes to her plan of care in awhile. She is maintain her current status.  6. Age-related osteoporosis without current pathological fracture Last dexascan was Trevino 10/07/16 with t score of -3.5. dr. Amil Amen does not want heron anything for this due to other mmeds  7. Recurrent major depressive disorder, in full remission ([place]) She is on prozac and klonopin. Combination works well. . Depression screen [facility] 2/9 01/13/2019 10/12/2018 07/27/2018  Decreased Interest 1 1 0  Down, Depressed, Hopeless 1 1 0  PHQ - 2 Score 2 2 0  Altered sleeping 0 1 -  Tired, decreased energy 0 1 -  Change in appetite 2 0 -  Feeling bad or failure about yourself  0 0 -  Trouble concentrating 1 0 -  Moving slowly or fidgety/restless 0 0 -  Suicidal thoughts 0 0 -  PHQ-9 Score 5 4 -  Difficult doing work/chores Somewhat difficult Somewhat difficult -  Some recent data might be hidden    8. [name] with gangrene ([place]) Recently had to go up on dose of sildenafil to 3 a day. She says the sores on her fingers will not heal and they are cold all the time.    Outpatient Encounter Medications as of 01/13/2019  Medication Sig  . Cholecalciferol  (VITAMIN D3) 2000 units TABS Take 2,000 Units by mouth daily.  . clonazePAM (KLONOPIN) 0.5 MG tablet Take 1 tablet (0.5 mg total) by mouth 2 (two) times daily.  . Ferrous Sulfate Dried (SLOW RELEASE IRON) 45 MG TBCR Take 45 mg by mouth daily.  Marland Kitchen FLUoxetine (PROZAC) 40 MG capsule Take 1 capsule (40 mg total) by mouth daily.  Marland Kitchen NIFEdipine (PROCARDIA XL/NIFEDICAL XL) 60 MG 24 hr tablet   . omeprazole (PRILOSEC) 20 MG capsule TAKE 1 TABLET DAILY  . sildenafil (REVATIO) 20 MG tablet Take 1 tablet (20 mg total) by mouth 2 (two) times daily.  Marland Kitchen SYNTHROID 75 MCG tablet Take 1 tablet (75 mcg total) by mouth daily before breakfast.    Past Surgical History:  Procedure Laterality Date  . BREAST ENHANCEMENT SURGERY  1975  . BREAST IMPLANT REMOVAL Bilateral 04/23/2016   Procedure: REMOVALBILATERAL BREAST IMPLANTS;  Surgeon: Wallace Going, DO;  Location: [facility];  Service: Plastics;  Laterality: Bilateral;  . CHOLECYSTECTOMY  1973  . HAND SURGERY  08/2012; 11/2012  . IR RADIOLOGIST EVAL & MGMT  07/27/2017  . IR SACROPLASTY BILATERAL  07/31/2017  . MELANOMA EXCISION     at 50 yrs of age  . ORIF HIP FRACTURE Right 02/22/2014   Procedure: OPEN REDUCTION INTERNAL FIXATION RIGHT HIP;  Surgeon: Sanjuana Kava, MD;  Location: [facility];  Service: Orthopedics;  Laterality:  Right;  Marland Kitchen TOTAL ABDOMINAL HYSTERECTOMY  1974    Family History  Problem Relation Age of Onset  . Pancreatic cancer Father   . Raynaud syndrome Father   . Cancer Father        pancreatic  . Rashes / Skin problems Daughter        possibly scleraderma  . Hip fracture Mother   . Mental illness Mother        attempted suicide at 10 yo    New complaints: Feels very fatigue.   Social history: Her husband had a stroke and she has to do some things for him.  Controlled substance contract: 01/13/19    Review of Systems  Constitutional: Negative for activity change and appetite change.  HENT: Negative.   Eyes:  Negative for pain.  Respiratory: Negative for shortness of breath.   Cardiovascular: Negative for chest pain, palpitations and leg swelling.  Gastrointestinal: Negative for abdominal pain.  Endocrine: Negative for polydipsia.  Genitourinary: Negative.   Skin: Negative for rash.  Neurological: Negative for dizziness, weakness and headaches.  Hematological: Does not bruise/bleed easily.  Psychiatric/Behavioral: Negative.   All other systems reviewed and are negative.      Objective:   Physical Exam Vitals signs and nursing note reviewed.  Constitutional:      General: She is not in acute distress.    Appearance: Normal appearance. She is well-developed.  HENT:     Head: Normocephalic.     Nose: Nose normal.  Eyes:     Pupils: Pupils are equal, round, and reactive to light.  Neck:     Musculoskeletal: Normal range of motion and neck supple.     Vascular: No carotid bruit or JVD.  Cardiovascular:     Rate and Rhythm: Normal rate and regular rhythm.     Heart sounds: Murmur (2/6 systolic) present.  Pulmonary:     Effort: Pulmonary effort is normal. No respiratory distress.     Breath sounds: Normal breath sounds. No wheezing or rales.  Chest:     Chest wall: No tenderness.  Abdominal:     General: Bowel sounds are normal. There is no distension or abdominal bruit.     Palpations: Abdomen is soft. There is no hepatomegaly, splenomegaly, mass or pulsatile mass.     Tenderness: There is no abdominal tenderness.  Musculoskeletal: Normal range of motion.     Comments: All fingers are swollen, cold to touch with multiple lesion in varying stages of healing on several fingers Rises slowly from sitting to standing  Lymphadenopathy:     Cervical: No cervical adenopathy.  Skin:    General: Skin is warm and dry.     Comments: Fingers and toes are cold to touch  Neurological:     Mental Status: She is alert and oriented to person, place, and time.     Deep Tendon Reflexes: Reflexes  are normal and symmetric.  Psychiatric:        Behavior: Behavior normal.        Thought Content: Thought content normal.        Judgment: Judgment normal.    BP (!) 154/62   Pulse 60   Temp 98.2 F (36.8 C) (Oral)   Ht 5' 2" (1.575 m)   Wt 119 lb (54 kg)   BMI 21.77 kg/m         Assessment & Plan:  Haley Trevino comes in today with chief complaint of Medical Management of Chronic Issues  Diagnosis and orders addressed:  1. Essential hypertension Low sodium diet - CMP14+EGFR  2. Hyperlipidemia with target LDL less than 100 Low fat diet - Lipid panel  3. Hypothyroidism, unspecified type - SYNTHROID 75 MCG tablet; Take 1 tablet (75 mcg total) by mouth daily before breakfast.  Dispense: 90 tablet; Refill: 1 - Thyroid Panel With TSH  4. Gastroesophageal reflux disease without esophagitis Avoid spicy foods Do not eat 2 hours prior to bedtime - omeprazole (PRILOSEC) 20 MG capsule; TAKE 1 TABLET DAILY  Dispense: 90 capsule; Refill: 1  5. SCLERODERMA Keep follow up with rheumatology  6. Age-related osteoporosis without current pathological fracture Weight bearing exercises as can tolerate  7. Recurrent major depressive disorder, in full remission ([place]) Sress management - FLUoxetine (PROZAC) 40 MG capsule; Take 1 capsule (40 mg total) by mouth daily.  Dispense: 90 capsule; Refill: 1 - clonazePAM (KLONOPIN) 0.5 MG tablet; Take 1 tablet (0.5 mg total) by mouth 2 (two) times daily.  Dispense: 60 tablet; Refill: 2  8. [name] with gangrene ([place]) Continue sildneafil as rx. Keep fingers as warm as possible   9. Fatigue, unspecified type Labs pending - Anemia Profile B - Vitamin B12   Labs pending Health Maintenance reviewed Diet and exercise encouraged  Follow up plan: 3 months   Haley Hassell Done, FNP

## 2019-01-13 NOTE — Patient Instructions (Signed)
Raynaud Phenomenon ° °Raynaud phenomenon is a condition that affects the blood vessels (arteries) that carry blood to your fingers and toes. The arteries that supply blood to your ears, lips, nipples, or the tip of your nose might also be affected. Raynaud phenomenon causes the arteries to become narrow temporarily (spasm). As a result, the flow of blood to the affected areas is temporarily decreased. This usually occurs in response to cold temperatures or stress. During an attack, the skin in the affected areas turns white, then blue, and finally red. You may also feel tingling or numbness in those areas. °Attacks usually last for only a brief period, and then the blood flow to the area returns to normal. In most cases, Raynaud phenomenon does not cause serious health problems. °What are the causes? °In many cases, the cause of this condition is not known. The condition may occur on its own (primary Raynaud phenomenon) or may be associated with other diseases or factors (secondary Raynaud phenomenon). °Possible causes may include: °· Diseases or medical conditions that damage the arteries. °· Injuries and repetitive actions that hurt the hands or feet. °· Being exposed to certain chemicals. °· Taking medicines that narrow the arteries. °· Other medical conditions, such as lupus, scleroderma, rheumatoid arthritis, thyroid problems, blood disorders, Sjogren syndrome, or atherosclerosis. °What increases the risk? °The following factors may make you more likely to develop this condition: °· Being 20-40 years old. °· Being female. °· Having a family history of Raynaud phenomenon. °· Living in a cold climate. °· Smoking. °What are the signs or symptoms? °Symptoms of this condition usually occur when you are exposed to cold temperatures or when you have emotional stress. The symptoms may last for a few minutes or up to several hours. They usually affect your fingers but may also affect your toes, nipples, lips, ears, or  the tip of your nose. Symptoms may include: °· Changes in skin color. The skin in the affected areas will turn pale or white. The skin may then change from white to bluish to red as normal blood flow returns to the area. °· Numbness, tingling, or pain in the affected areas. °In severe cases, symptoms may include: °· Skin sores. °· Tissues decaying and dying (gangrene). °How is this diagnosed? °This condition may be diagnosed based on: °· Your symptoms and medical history. °· A physical exam. During the exam, you may be asked to put your hands in cold water to check for a reaction to cold temperature. °· Tests, such as: °? Blood tests to check for other diseases or conditions. °? A test to check the movement of blood through your arteries and veins (vascular ultrasound). °? A test in which the skin at the base of your fingernail is examined under a microscope (nailfold capillaroscopy). °How is this treated? °Treatment for this condition often involves making lifestyle changes and taking steps to control your exposure to cold temperatures. For more severe cases, medicine (calcium channel blockers) may be used to improve blood flow. Surgery is sometimes done to block the nerves that control the affected arteries, but this is rare. °Follow these instructions at home: °Avoiding cold temperatures °Take these steps to avoid exposure to cold: °· If possible, stay indoors during cold weather. °· When you go outside during cold weather, dress in layers and wear mittens, a hat, a scarf, and warm footwear. °· Wear mittens or gloves when handling ice or frozen food. °· Use holders for glasses or cans containing cold drinks. °·   Let warm water run for a while before taking a shower or bath. °· Warm up the car before driving in cold weather. °Lifestyle ° °· If possible, avoid stressful and emotional situations. Try to find ways to manage your stress, such as: °? Exercise. °? Yoga. °? Meditation. °? Biofeedback. °· Do not use any  products that contain nicotine or tobacco, such as cigarettes and e-cigarettes. If you need help quitting, ask your health care provider. °· Avoid secondhand smoke. °· Limit your use of caffeine. °? Switch to decaffeinated coffee, tea, and soda. °? Avoid chocolate. °· Avoid vibrating tools and machinery. °General instructions °· Protect your hands and feet from injuries, cuts, or bruises. °· Avoid wearing tight rings or wristbands. °· Wear loose fitting socks and comfortable, roomy shoes. °· Take over-the-counter and prescription medicines only as told by your health care provider. °Contact a health care provider if: °· Your discomfort becomes worse despite lifestyle changes. °· You develop sores on your fingers or toes that do not heal. °· Your fingers or toes turn black. °· You have breaks in the skin on your fingers or toes. °· You have a fever. °· You have pain or swelling in your joints. °· You have a rash. °· Your symptoms occur on only one side of your body. °Summary °· Raynaud phenomenon is a condition that affects the arteries that carry blood to your fingers, toes, ears, lips, nipples, or the tip of your nose. °· In many cases, the cause of this condition is not known. °· Symptoms of this condition include changes in skin color, and numbness and tingling of the affected area. °· Treatment for this condition includes lifestyle changes, reducing exposure to cold temperatures, and using medicines for severe cases of the condition. °· Contact your health care provider if your condition worsens despite treatment. °This information is not intended to replace advice given to you by your health care provider. Make sure you discuss any questions you have with your health care provider. °Document Released: 06/20/2000 Document Revised: 06/26/2017 Document Reviewed: 08/04/2016 °Elsevier Patient Education © 2020 [employer] ° °

## 2019-01-18 LAB — ANEMIA PROFILE B
Basophils Absolute: 0.1 10*3/uL (ref 0.0–0.2)
Basos: 1 %
EOS (ABSOLUTE): 0.2 10*3/uL (ref 0.0–0.4)
Eos: 3 %
Ferritin: 14 ng/mL — ABNORMAL LOW (ref 15–150)
Folate: 4.5 ng/mL (ref >3.0)
Hematocrit: 37.1 % (ref 34.0–46.6)
Hemoglobin: 11.7 g/dL (ref 11.1–15.9)
Immature Grans (Abs): 0 10*3/uL (ref 0.0–0.1)
Immature Granulocytes: 0 %
Iron Saturation: 14 % — ABNORMAL LOW (ref 15–55)
Iron: 51 ug/dL (ref 27–139)
Lymphocytes Absolute: 2.1 10*3/uL (ref 0.7–3.1)
Lymphs: 26 %
MCH: 26.8 pg (ref 26.6–33.0)
MCHC: 31.5 g/dL (ref 31.5–35.7)
MCV: 85 fL (ref 79–97)
Monocytes Absolute: 0.5 10*3/uL (ref 0.1–0.9)
Monocytes: 6 %
Neutrophils Absolute: 5 10*3/uL (ref 1.4–7.0)
Neutrophils: 64 %
Platelets: 339 10*3/uL (ref 150–450)
RBC: 4.37 x10E6/uL (ref 3.77–5.28)
RDW: 14.3 % (ref 11.7–15.4)
Retic Ct Pct: 0.7 % (ref 0.6–2.6)
Total Iron Binding Capacity: 354 ug/dL (ref 250–450)
UIBC: 303 ug/dL (ref 118–369)
Vitamin B-12: 561 pg/mL (ref 232–1245)
WBC: 7.9 10*3/uL (ref 3.4–10.8)

## 2019-01-18 LAB — CMP14+EGFR
ALT: 6 IU/L (ref 0–32)
AST: 12 IU/L (ref 0–40)
Albumin/Globulin Ratio: 1.6 (ref 1.2–2.2)
Albumin: 4.4 g/dL (ref 3.7–4.7)
Alkaline Phosphatase: 51 IU/L (ref 39–117)
BUN/Creatinine Ratio: 13 (ref 12–28)
BUN: 10 mg/dL (ref 8–27)
Bilirubin Total: <0.2 mg/dL (ref 0.0–1.2)
CO2: 22 mmol/L (ref 20–29)
Calcium: 9 mg/dL (ref 8.7–10.3)
Chloride: 103 mmol/L (ref 96–106)
Creatinine, Ser: 0.8 mg/dL (ref 0.57–1.00)
GFR calc Af Amer: 82 mL/min/{1.73_m2} (ref >59)
GFR calc non Af Amer: 71 mL/min/{1.73_m2} (ref >59)
Globulin, Total: 2.7 g/dL (ref 1.5–4.5)
Glucose: 87 mg/dL (ref 65–99)
Potassium: 4.2 mmol/L (ref 3.5–5.2)
Sodium: 140 mmol/L (ref 134–144)
Total Protein: 7.1 g/dL (ref 6.0–8.5)

## 2019-01-18 LAB — LIPID PANEL
Chol/HDL Ratio: 4.6 ratio — ABNORMAL HIGH (ref 0.0–4.4)
Cholesterol, Total: 231 mg/dL — ABNORMAL HIGH (ref 100–199)
HDL: 50 mg/dL (ref >39)
LDL Calculated: 150 mg/dL — ABNORMAL HIGH (ref 0–99)
Triglycerides: 156 mg/dL — ABNORMAL HIGH (ref 0–149)
VLDL Cholesterol Cal: 31 mg/dL (ref 5–40)

## 2019-01-18 LAB — THYROID PANEL WITH TSH
Free Thyroxine Index: 2.1 (ref 1.2–4.9)
T3 Uptake Ratio: 22 % — ABNORMAL LOW (ref 24–39)
T4, Total: 9.4 ug/dL (ref 4.5–12.0)
TSH: 1.52 u[IU]/mL (ref 0.450–4.500)

## 2019-01-21 ENCOUNTER — Other Ambulatory Visit: Payer: Self-pay

## 2019-01-21 ENCOUNTER — Encounter: Payer: Self-pay | Admitting: Family

## 2019-01-21 ENCOUNTER — Ambulatory Visit (INDEPENDENT_AMBULATORY_CARE_PROVIDER_SITE_OTHER): Payer: Medicare Other | Admitting: Family

## 2019-01-21 DIAGNOSIS — R42 Dizziness and giddiness: Secondary | ICD-10-CM

## 2019-01-21 DIAGNOSIS — D509 Iron deficiency anemia, unspecified: Secondary | ICD-10-CM | POA: Diagnosis not present

## 2019-01-21 DIAGNOSIS — J301 Allergic rhinitis due to pollen: Secondary | ICD-10-CM | POA: Diagnosis not present

## 2019-01-21 MED ORDER — FLUTICASONE PROPIONATE 50 MCG/ACT NA SUSP: 2.0000 | Freq: Every day | NASAL | 6 refills | Status: DC

## 2019-01-21 MED ORDER — MECLIZINE HCL 25 MG PO TABS: 25.0000 mg | ORAL_TABLET | Freq: Three times a day (TID) | ORAL | 1 refills | Status: DC | PRN

## 2019-01-21 NOTE — Progress Notes (Signed)
   Virtual Visit via telephone Note  I connected with Haley Trevino on 01/21/19 at 11:28 AM by telephone and verified that I am speaking with the correct person using two identifiers. Haley Trevino is currently located at home  and no one  is currently with her during visit. The provider, Evelina Dun, FNP is located in their office at time of visit.  I discussed the limitations, risks, security and privacy concerns of performing an evaluation and management service by telephone and the availability of in person appointments. I also discussed with the patient that there may be a patient responsible charge related to this service. The patient expressed understanding and agreed to proceed.   History and Present Illness:  Dizziness This is a recurrent problem. The current episode started in the past 7 days. The problem occurs intermittently. The problem has been waxing and waning. Associated symptoms include nausea and vertigo. Pertinent negatives include no congestion, coughing, fatigue, fever, sore throat, swollen glands or vomiting. Associated symptoms comments: Headaches. She has tried position changes and rest for the symptoms. The treatment provided mild relief.  Anemia Presents for follow-up visit. Symptoms include light-headedness and malaise/fatigue. There has been no fever.      Review of Systems  Constitutional: Positive for malaise/fatigue. Negative for fatigue and fever.  HENT: Negative for congestion and sore throat.   Respiratory: Negative for cough.   Gastrointestinal: Positive for nausea. Negative for vomiting.  Neurological: Positive for dizziness, vertigo and light-headedness.  All other systems reviewed and are negative.    Observations/Objective: No SOB or distress noted   Assessment and Plan: 1. Vertigo Fall preventions discussed Avoid fast or changes in position changes RTO if symptoms worsen or do not improve  - meclizine (ANTIVERT) 25 MG tablet;  Take 1 tablet (25 mg total) by mouth 3 (three) times daily as needed for dizziness.  Dispense: 90 tablet; Refill: 1 - fluticasone (FLONASE) 50 MCG/ACT nasal spray; Place 2 sprays into both nostrils daily.  Dispense: 16 g; Refill: 6  2. Iron deficiency anemia, unspecified iron deficiency anemia type She will increase her iron OTC to BID from daily Increase iron rich foods  3. Allergic rhinitis due to pollen, unspecified seasonality Start daily flonase - fluticasone (FLONASE) 50 MCG/ACT nasal spray; Place 2 sprays into both nostrils daily.  Dispense: 16 g; Refill: 6  Labs discussed and reviewed    I discussed the assessment and treatment plan with the patient. The patient was provided an opportunity to ask questions and all were answered. The patient agreed with the plan and demonstrated an understanding of the instructions.   The patient was advised to call back or seek an in-person evaluation if the symptoms worsen or if the condition fails to improve as anticipated.  The above assessment and management plan was discussed with the patient. The patient verbalized understanding of and has agreed to the management plan. Patient is aware to call the clinic if symptoms persist or worsen. Patient is aware when to return to the clinic for a follow-up visit. Patient educated on when it is appropriate to go to the emergency department.   Time call ended:  11:46 AM   I provided 18 minutes of non-face-to-face time during this encounter.    Evelina Dun, FNP

## 2019-02-07 ENCOUNTER — Encounter: Payer: Self-pay | Admitting: Family Medicine

## 2019-02-07 ENCOUNTER — Other Ambulatory Visit: Payer: Self-pay

## 2019-02-07 ENCOUNTER — Ambulatory Visit (INDEPENDENT_AMBULATORY_CARE_PROVIDER_SITE_OTHER): Payer: Medicare Other | Admitting: Family Medicine

## 2019-02-07 DIAGNOSIS — H60311 Diffuse otitis externa, right ear: Secondary | ICD-10-CM | POA: Diagnosis not present

## 2019-02-07 MED ORDER — CIPROFLOXACIN HCL 500 MG PO TABS: 500.0000 mg | ORAL_TABLET | Freq: Two times a day (BID) | ORAL | 0 refills | Status: DC

## 2019-02-07 NOTE — Progress Notes (Signed)
Virtual Visit via telephone Note Due to COVID-19 pandemic this visit was conducted virtually. This visit type was conducted due to national recommendations for restrictions regarding the COVID-19 Pandemic (e.g. social distancing, sheltering in place) in an effort to limit this patient's exposure and mitigate transmission in our community. All issues noted in this document were discussed and addressed.  A physical exam was not performed with this format.   I connected with [name] on 02/07/19 at 1315 by telephone and verified that I am speaking with the correct person using two identifiers. Haley Trevino is currently located at home and family is currently with them during visit. The provider, Monia Pouch, FNP is located in their office at time of visit.  I discussed the limitations, risks, security and privacy concerns of performing an evaluation and management service by telephone and the availability of in person appointments. I also discussed with the patient that there may be a patient responsible charge related to this service. The patient expressed understanding and agreed to proceed.  Subjective:  Patient ID: Haley Trevino, female    DOB: [birth year], 78 y.o.   MRN: [id number]  Chief Complaint:  Otalgia   HPI: Haley Trevino is a 78 y.o. female presenting on 02/07/2019 for Otalgia   Pt reports ongoing right ear pain. States she was seen by the audiologist and they pulled something out of her ear. States it cause the ear to bleed. Pt states she has had ongoing pain and swelling of the right ear canal since the incident. She states she did have a slight fever yesterday, 99.8. she denies drainage from the ear. States the pain is sharp at times.   Otalgia  There is pain in the right ear. This is a new problem. The current episode started 1 to 4 weeks ago. The problem occurs constantly. The problem has been gradually worsening. The pain is at a severity of 4/10. The  pain is mild. Associated symptoms include ear discharge. Pertinent negatives include no abdominal pain, coughing, diarrhea, headaches, hearing loss, neck pain, rash, rhinorrhea, sore throat or vomiting. She has tried ear drops for the symptoms. The treatment provided no relief.     Relevant past medical, surgical, family, and social history reviewed and updated as indicated.  Allergies and medications reviewed and updated.   Past Medical History:  Diagnosis Date  . Cataract   . Dyspnea   . Osteoarthritis   . Raynaud disease   . Scleroderma ([place])   . Thyroid disease    hypothyroidism    Past Surgical History:  Procedure Laterality Date  . BREAST ENHANCEMENT SURGERY  1975  . BREAST IMPLANT REMOVAL Bilateral 04/23/2016   Procedure: REMOVALBILATERAL BREAST IMPLANTS;  Surgeon: Wallace Going, DO;  Location: [facility];  Service: Plastics;  Laterality: Bilateral;  . CHOLECYSTECTOMY  1973  . HAND SURGERY  08/2012; 11/2012  . IR RADIOLOGIST EVAL & MGMT  07/27/2017  . IR SACROPLASTY BILATERAL  07/31/2017  . MELANOMA EXCISION     at 26 yrs of age  . ORIF HIP FRACTURE Right 02/22/2014   Procedure: OPEN REDUCTION INTERNAL FIXATION RIGHT HIP;  Surgeon: Sanjuana Kava, MD;  Location: [facility];  Service: Orthopedics;  Laterality: Right;  . TOTAL ABDOMINAL HYSTERECTOMY  1974    Social History   Socioeconomic History  . Marital status: Married    Spouse name: Not on file  . Number of children: Not on file  . Years of education: Not  on file  . Highest education level: Not on file  Occupational History  . Occupation: retired Occupational psychologist at [employer]  . Financial resource strain: Not on file  . Food insecurity    Worry: Not on file    Inability: Not on file  . Transportation needs    Medical: Not on file    Non-medical: Not on file  Tobacco Use  . Smoking status: Never Smoker  . Smokeless tobacco: Never Used  . Tobacco comment: passive tobacco smoke  exposure as child  Substance and Sexual Activity  . Alcohol use: No  . Drug use: No  . Sexual activity: Not Currently    Birth control/protection: Surgical  Lifestyle  . Physical activity    Days per week: Not on file    Minutes per session: Not on file  . Stress: Not on file  Relationships  . Social Herbalist on phone: Not on file    Gets together: Not on file    Attends religious service: Not on file    Active member of club or organization: Not on file    Attends meetings of clubs or organizations: Not on file    Relationship status: Not on file  . Intimate partner violence    Fear of current or ex partner: Not on file    Emotionally abused: Not on file    Physically abused: Not on file    Forced sexual activity: Not on file  Other Topics Concern  . Not on file  Social History Narrative   Regular exercise: walking   Caffeine use: 1 cup of coffee daily    Outpatient Encounter Medications as of 02/07/2019  Medication Sig  . Cholecalciferol (VITAMIN D3) 2000 units TABS Take 2,000 Units by mouth daily.  . ciprofloxacin (CIPRO) 500 MG tablet Take 1 tablet (500 mg total) by mouth 2 (two) times daily.  . clonazePAM (KLONOPIN) 0.5 MG tablet Take 1 tablet (0.5 mg total) by mouth 2 (two) times daily.  . Ferrous Sulfate Dried (SLOW RELEASE IRON) 45 MG TBCR Take 45 mg by mouth daily.  Marland Kitchen FLUoxetine (PROZAC) 40 MG capsule Take 1 capsule (40 mg total) by mouth daily.  . fluticasone (FLONASE) 50 MCG/ACT nasal spray Place 2 sprays into both nostrils daily.  . meclizine (ANTIVERT) 25 MG tablet Take 1 tablet (25 mg total) by mouth 3 (three) times daily as needed for dizziness.  Marland Kitchen NIFEdipine (PROCARDIA XL/NIFEDICAL XL) 60 MG 24 hr tablet   . omeprazole (PRILOSEC) 20 MG capsule TAKE 1 TABLET DAILY  . sildenafil (REVATIO) 20 MG tablet Take 1 tablet (20 mg total) by mouth 2 (two) times daily. (Patient taking differently: Take 20 mg by mouth 3 (three) times daily. )  . SYNTHROID 75 MCG  tablet Take 1 tablet (75 mcg total) by mouth daily before breakfast.   No facility-administered encounter medications on file as of 02/07/2019.     Allergies  Allergen Reactions  . Codeine Shortness Of Breath    REACTION: dyspnea  . Dilaudid [Hydromorphone Hcl] Anaphylaxis  . Hydromorphone Anaphylaxis  . Acyclovir And Related   . Crestor [Rosuvastatin Calcium] Other (See Comments)    Muscle pain  . Gabapentin Swelling    Swelling in feet, and in legs  . Aleve [Naproxen Sodium] Rash  . Lyrica [Pregabalin] Other (See Comments)    Swelling in feet, and in legs    Review of Systems  Constitutional: Positive for chills and fever. Negative  for activity change, appetite change, diaphoresis, fatigue and unexpected weight change.  HENT: Positive for ear discharge and ear pain. Negative for congestion, dental problem, drooling, facial swelling, hearing loss, mouth sores, nosebleeds, postnasal drip, rhinorrhea, sinus pressure, sneezing, sore throat, tinnitus, trouble swallowing and voice change.   Respiratory: Negative for cough and chest tightness.   Cardiovascular: Negative for chest pain.  Gastrointestinal: Negative for abdominal pain, diarrhea and vomiting.  Genitourinary: Negative for decreased urine volume and difficulty urinating.  Musculoskeletal: Negative for neck pain.  Skin: Negative for rash.  Neurological: Negative for dizziness, light-headedness and headaches.  All other systems reviewed and are negative.        Observations/Objective: No vital signs or physical exam, this was a telephone or virtual health encounter.  Pt alert and oriented, answers all questions appropriately, and able to speak in full sentences.    Assessment and Plan: Tashanda was seen today for otalgia.  Diagnoses and all orders for this visit:  Acute diffuse otitis externa of right ear Due to reported symptoms of low grade fever, continued right otalgia, swelling of ear canal, and recent ear canal  trauma, will treat with oral Cipro. Pt aware to report any new or worsening symptoms. Follow up in 2 weeks for reevaluation. Medications as prescribed.  -     ciprofloxacin (CIPRO) 500 MG tablet; Take 1 tablet (500 mg total) by mouth 2 (two) times daily.     Follow Up Instructions: Return in about 2 weeks (around 02/21/2019), or if symptoms worsen or fail to improve, for otitis externa.    I discussed the assessment and treatment plan with the patient. The patient was provided an opportunity to ask questions and all were answered. The patient agreed with the plan and demonstrated an understanding of the instructions.   The patient was advised to call back or seek an in-person evaluation if the symptoms worsen or if the condition fails to improve as anticipated.  The above assessment and management plan was discussed with the patient. The patient verbalized understanding of and has agreed to the management plan. Patient is aware to call the clinic if symptoms persist or worsen. Patient is aware when to return to the clinic for a follow-up visit. Patient educated on when it is appropriate to go to the emergency department.    I provided 15 minutes of non-face-to-face time during this encounter. The call started at 1315. The call ended at 1330. The other time was used for coordination of care.    Monia Pouch, FNP-C [facility] [address] [place], [place] [address] [phone] 02/07/19

## 2019-02-14 DIAGNOSIS — H2511 Age-related nuclear cataract, right eye: Secondary | ICD-10-CM | POA: Diagnosis not present

## 2019-02-14 DIAGNOSIS — H2512 Age-related nuclear cataract, left eye: Secondary | ICD-10-CM | POA: Diagnosis not present

## 2019-03-03 DIAGNOSIS — H2511 Age-related nuclear cataract, right eye: Secondary | ICD-10-CM | POA: Diagnosis not present

## 2019-03-03 DIAGNOSIS — H25811 Combined forms of age-related cataract, right eye: Secondary | ICD-10-CM | POA: Diagnosis not present

## 2019-03-17 DIAGNOSIS — H25812 Combined forms of age-related cataract, left eye: Secondary | ICD-10-CM | POA: Diagnosis not present

## 2019-03-17 DIAGNOSIS — H2512 Age-related nuclear cataract, left eye: Secondary | ICD-10-CM | POA: Diagnosis not present

## 2019-03-22 DIAGNOSIS — I7300 Raynaud's syndrome without gangrene: Secondary | ICD-10-CM | POA: Diagnosis not present

## 2019-03-22 DIAGNOSIS — M150 Primary generalized (osteo)arthritis: Secondary | ICD-10-CM | POA: Diagnosis not present

## 2019-03-22 DIAGNOSIS — Z6821 Body mass index (BMI) 21.0-21.9, adult: Secondary | ICD-10-CM | POA: Diagnosis not present

## 2019-03-22 DIAGNOSIS — D508 Other iron deficiency anemias: Secondary | ICD-10-CM | POA: Diagnosis not present

## 2019-03-22 DIAGNOSIS — M340 Progressive systemic sclerosis: Secondary | ICD-10-CM | POA: Diagnosis not present

## 2019-03-22 DIAGNOSIS — M810 Age-related osteoporosis without current pathological fracture: Secondary | ICD-10-CM | POA: Diagnosis not present

## 2019-03-22 DIAGNOSIS — L98499 Non-pressure chronic ulcer of skin of other sites with unspecified severity: Secondary | ICD-10-CM | POA: Diagnosis not present

## 2019-03-22 DIAGNOSIS — K219 Gastro-esophageal reflux disease without esophagitis: Secondary | ICD-10-CM | POA: Diagnosis not present

## 2019-03-22 DIAGNOSIS — M542 Cervicalgia: Secondary | ICD-10-CM | POA: Diagnosis not present

## 2019-04-13 ENCOUNTER — Other Ambulatory Visit: Payer: Self-pay

## 2019-04-14 ENCOUNTER — Ambulatory Visit (INDEPENDENT_AMBULATORY_CARE_PROVIDER_SITE_OTHER): Payer: Medicare Other | Admitting: Nurse Practitioner

## 2019-04-14 ENCOUNTER — Encounter: Payer: Self-pay | Admitting: Nurse Practitioner

## 2019-04-14 VITALS — BP 136/60 | HR 73 | Temp 99.1°F | Resp 16 | Ht 62.0 in | Wt 118.0 lb

## 2019-04-14 DIAGNOSIS — I10 Essential (primary) hypertension: Secondary | ICD-10-CM

## 2019-04-14 DIAGNOSIS — E785 Hyperlipidemia, unspecified: Secondary | ICD-10-CM | POA: Diagnosis not present

## 2019-04-14 DIAGNOSIS — M349 Systemic sclerosis, unspecified: Secondary | ICD-10-CM

## 2019-04-14 DIAGNOSIS — F3342 Major depressive disorder, recurrent, in full remission: Secondary | ICD-10-CM | POA: Diagnosis not present

## 2019-04-14 DIAGNOSIS — K219 Gastro-esophageal reflux disease without esophagitis: Secondary | ICD-10-CM | POA: Diagnosis not present

## 2019-04-14 DIAGNOSIS — E039 Hypothyroidism, unspecified: Secondary | ICD-10-CM | POA: Diagnosis not present

## 2019-04-14 DIAGNOSIS — Z23 Encounter for immunization: Secondary | ICD-10-CM | POA: Diagnosis not present

## 2019-04-14 DIAGNOSIS — I7301 Raynaud's syndrome with gangrene: Secondary | ICD-10-CM

## 2019-04-14 MED ORDER — SILDENAFIL CITRATE 20 MG PO TABS: 20.0000 mg | ORAL_TABLET | Freq: Three times a day (TID) | ORAL | 3 refills | Status: DC

## 2019-04-14 MED ORDER — FLUOXETINE HCL 40 MG PO CAPS: 40.0000 mg | ORAL_CAPSULE | Freq: Every day | ORAL | 1 refills | Status: DC

## 2019-04-14 MED ORDER — OMEPRAZOLE 20 MG PO CPDR: DELAYED_RELEASE_CAPSULE | ORAL | 1 refills | Status: DC

## 2019-04-14 MED ORDER — CLONAZEPAM 0.5 MG PO TABS: 0.5000 mg | ORAL_TABLET | Freq: Two times a day (BID) | ORAL | 2 refills | Status: DC

## 2019-04-14 MED ORDER — SYNTHROID 75 MCG PO TABS: 75.0000 ug | ORAL_TABLET | Freq: Every day | ORAL | 1 refills | Status: DC

## 2019-04-14 NOTE — Patient Instructions (Signed)
Raynaud Phenomenon ° °Raynaud phenomenon is a condition that affects the blood vessels (arteries) that carry blood to your fingers and toes. The arteries that supply blood to your ears, lips, nipples, or the tip of your nose might also be affected. Raynaud phenomenon causes the arteries to become narrow temporarily (spasm). As a result, the flow of blood to the affected areas is temporarily decreased. This usually occurs in response to cold temperatures or stress. During an attack, the skin in the affected areas turns white, then blue, and finally red. You may also feel tingling or numbness in those areas. °Attacks usually last for only a brief period, and then the blood flow to the area returns to normal. In most cases, Raynaud phenomenon does not cause serious health problems. °What are the causes? °In many cases, the cause of this condition is not known. The condition may occur on its own (primary Raynaud phenomenon) or may be associated with other diseases or factors (secondary Raynaud phenomenon). °Possible causes may include: °· Diseases or medical conditions that damage the arteries. °· Injuries and repetitive actions that hurt the hands or feet. °· Being exposed to certain chemicals. °· Taking medicines that narrow the arteries. °· Other medical conditions, such as lupus, scleroderma, rheumatoid arthritis, thyroid problems, blood disorders, Sjogren syndrome, or atherosclerosis. °What increases the risk? °The following factors may make you more likely to develop this condition: °· Being 20-40 years old. °· Being female. °· Having a family history of Raynaud phenomenon. °· Living in a cold climate. °· Smoking. °What are the signs or symptoms? °Symptoms of this condition usually occur when you are exposed to cold temperatures or when you have emotional stress. The symptoms may last for a few minutes or up to several hours. They usually affect your fingers but may also affect your toes, nipples, lips, ears, or  the tip of your nose. Symptoms may include: °· Changes in skin color. The skin in the affected areas will turn pale or white. The skin may then change from white to bluish to red as normal blood flow returns to the area. °· Numbness, tingling, or pain in the affected areas. °In severe cases, symptoms may include: °· Skin sores. °· Tissues decaying and dying (gangrene). °How is this diagnosed? °This condition may be diagnosed based on: °· Your symptoms and medical history. °· A physical exam. During the exam, you may be asked to put your hands in cold water to check for a reaction to cold temperature. °· Tests, such as: °? Blood tests to check for other diseases or conditions. °? A test to check the movement of blood through your arteries and veins (vascular ultrasound). °? A test in which the skin at the base of your fingernail is examined under a microscope (nailfold capillaroscopy). °How is this treated? °Treatment for this condition often involves making lifestyle changes and taking steps to control your exposure to cold temperatures. For more severe cases, medicine (calcium channel blockers) may be used to improve blood flow. Surgery is sometimes done to block the nerves that control the affected arteries, but this is rare. °Follow these instructions at home: °Avoiding cold temperatures °Take these steps to avoid exposure to cold: °· If possible, stay indoors during cold weather. °· When you go outside during cold weather, dress in layers and wear mittens, a hat, a scarf, and warm footwear. °· Wear mittens or gloves when handling ice or frozen food. °· Use holders for glasses or cans containing cold drinks. °·   Let warm water run for a while before taking a shower or bath. °· Warm up the car before driving in cold weather. °Lifestyle ° °· If possible, avoid stressful and emotional situations. Try to find ways to manage your stress, such as: °? Exercise. °? Yoga. °? Meditation. °? Biofeedback. °· Do not use any  products that contain nicotine or tobacco, such as cigarettes and e-cigarettes. If you need help quitting, ask your health care provider. °· Avoid secondhand smoke. °· Limit your use of caffeine. °? Switch to decaffeinated coffee, tea, and soda. °? Avoid chocolate. °· Avoid vibrating tools and machinery. °General instructions °· Protect your hands and feet from injuries, cuts, or bruises. °· Avoid wearing tight rings or wristbands. °· Wear loose fitting socks and comfortable, roomy shoes. °· Take over-the-counter and prescription medicines only as told by your health care provider. °Contact a health care provider if: °· Your discomfort becomes worse despite lifestyle changes. °· You develop sores on your fingers or toes that do not heal. °· Your fingers or toes turn black. °· You have breaks in the skin on your fingers or toes. °· You have a fever. °· You have pain or swelling in your joints. °· You have a rash. °· Your symptoms occur on only one side of your body. °Summary °· Raynaud phenomenon is a condition that affects the arteries that carry blood to your fingers, toes, ears, lips, nipples, or the tip of your nose. °· In many cases, the cause of this condition is not known. °· Symptoms of this condition include changes in skin color, and numbness and tingling of the affected area. °· Treatment for this condition includes lifestyle changes, reducing exposure to cold temperatures, and using medicines for severe cases of the condition. °· Contact your health care provider if your condition worsens despite treatment. °This information is not intended to replace advice given to you by your health care provider. Make sure you discuss any questions you have with your health care provider. °Document Released: 06/20/2000 Document Revised: 06/26/2017 Document Reviewed: 08/04/2016 °Elsevier Patient Education © 2020 [employer] ° °

## 2019-04-14 NOTE — Progress Notes (Addendum)
Subjective:    Patient ID: Haley Trevino, female    DOB: September 24, 1940, 78 y.o.   MRN: [id number]   Chief Complaint: medical management of chronic isues   HPI:  1. Essential hypertension No c/o chest pain, sob or headache. Does not check blood pressure at home. BP Readings from Last 3 Encounters:  01/13/19 (!) 154/62  07/27/18 131/62  07/12/18 (!) 140/59     2. Hypothyroidism, unspecified type No problems that she is aware of. Lab Results  Component Value Date   TSH 1.520 01/13/2019     3. Hyperlipidemia with target LDL less than 100 Has very poor appetite, but she still tries to watch what she eats. cannnot btolerate a statin. Lab Results  Component Value Date   CHOL 231 (H) 01/13/2019   HDL 50 01/13/2019   LDLCALC 150 (H) 01/13/2019   TRIG 156 (H) 01/13/2019   CHOLHDL 4.6 (H) 01/13/2019     4. Recurrent major depressive disorder, in full remission ([place]) Is currently on prozac and klonopin. Says she is doing the best she can considering all of hr medical problems. Depression screen [facility] 2/9 04/14/2019 01/13/2019 10/12/2018  Decreased Interest 0 1 1  Down, Depressed, Hopeless 0 1 1  PHQ - 2 Score 0 2 2  Altered sleeping - 0 1  Tired, decreased energy - 0 1  Change in appetite - 2 0  Feeling bad or failure about yourself  - 0 0  Trouble concentrating - 1 0  Moving slowly or fidgety/restless - 0 0  Suicidal thoughts - 0 0  PHQ-9 Score - 5 4  Difficult doing work/chores - Somewhat difficult Somewhat difficult  Some recent data might be hidden    5. Gastroesophageal reflux disease without esophagitis Has symptoms daily if she does not take her nexium.  6. SCLERODERMA Sees DR. [name] in [place]. No change to recent meds- she sees him every 3 months  7. [name] with gangrene ([place]) Is currently on procardia and sildenafil daily. Currently her fingers look good. Tips of right middle and index finger have open sores and are very tender to touch    Outpatient Encounter Medications as of 04/14/2019  Medication Sig  . Cholecalciferol (VITAMIN D3) 2000 units TABS Take 2,000 Units by mouth daily.  . ciprofloxacin (CIPRO) 500 MG tablet Take 1 tablet (500 mg total) by mouth 2 (two) times daily.  . clonazePAM (KLONOPIN) 0.5 MG tablet Take 1 tablet (0.5 mg total) by mouth 2 (two) times daily.  . Ferrous Sulfate Dried (SLOW RELEASE IRON) 45 MG TBCR Take 45 mg by mouth daily.  Marland Kitchen FLUoxetine (PROZAC) 40 MG capsule Take 1 capsule (40 mg total) by mouth daily.  . fluticasone (FLONASE) 50 MCG/ACT nasal spray Place 2 sprays into both nostrils daily.  . meclizine (ANTIVERT) 25 MG tablet Take 1 tablet (25 mg total) by mouth 3 (three) times daily as needed for dizziness.  Marland Kitchen NIFEdipine (PROCARDIA XL/NIFEDICAL XL) 60 MG 24 hr tablet   . omeprazole (PRILOSEC) 20 MG capsule TAKE 1 TABLET DAILY  . sildenafil (REVATIO) 20 MG tablet Take 1 tablet (20 mg total) by mouth 2 (two) times daily. (Patient taking differently: Take 20 mg by mouth 3 (three) times daily. )  . SYNTHROID 75 MCG tablet Take 1 tablet (75 mcg total) by mouth daily before breakfast.     Past Surgical History:  Procedure Laterality Date  . BREAST ENHANCEMENT SURGERY  1975  . BREAST IMPLANT REMOVAL Bilateral 04/23/2016  Procedure: REMOVALBILATERAL BREAST IMPLANTS;  Surgeon: Wallace Going, DO;  Location: [facility];  Service: Plastics;  Laterality: Bilateral;  . CHOLECYSTECTOMY  1973  . HAND SURGERY  08/2012; 11/2012  . IR RADIOLOGIST EVAL & MGMT  07/27/2017  . IR SACROPLASTY BILATERAL  07/31/2017  . MELANOMA EXCISION     at 62 yrs of age  . ORIF HIP FRACTURE Right 02/22/2014   Procedure: OPEN REDUCTION INTERNAL FIXATION RIGHT HIP;  Surgeon: Sanjuana Kava, MD;  Location: [facility];  Service: Orthopedics;  Laterality: Right;  . TOTAL ABDOMINAL HYSTERECTOMY  1974    Family History  Problem Relation Age of Onset  . Pancreatic cancer Father   . Raynaud syndrome Father   .  Cancer Father        pancreatic  . Rashes / Skin problems Daughter        possibly scleraderma  . Hip fracture Mother   . Mental illness Mother        attempted suicide at 36 yo    New complaints: None today  Social history: Husband dx with lung cancer  Controlled substance contract: 01/18/19    Review of Systems  Constitutional: Negative for activity change and appetite change.  HENT: Negative.   Eyes: Negative for pain.  Respiratory: Negative for shortness of breath.   Cardiovascular: Negative for chest pain, palpitations and leg swelling.  Gastrointestinal: Negative for abdominal pain.  Endocrine: Negative for polydipsia.  Genitourinary: Negative.   Musculoskeletal: Positive for arthralgias and back pain.  Skin: Negative for rash.  Neurological: Negative for dizziness, weakness and headaches.  Hematological: Does not bruise/bleed easily.  Psychiatric/Behavioral: Negative.   All other systems reviewed and are negative.      Objective:   Physical Exam Vitals signs and nursing note reviewed.  Constitutional:      General: She is not in acute distress.    Appearance: Normal appearance. She is well-developed.  HENT:     Head: Normocephalic.     Nose: Nose normal.  Eyes:     Pupils: Pupils are equal, round, and reactive to light.  Neck:     Musculoskeletal: Normal range of motion and neck supple.     Vascular: No carotid bruit or JVD.  Cardiovascular:     Rate and Rhythm: Normal rate and regular rhythm.     Heart sounds: Murmur (2/6) present.  Pulmonary:     Effort: Pulmonary effort is normal. No respiratory distress.     Breath sounds: Normal breath sounds. No wheezing or rales.  Chest:     Chest wall: No tenderness.  Abdominal:     General: Bowel sounds are normal. There is no distension or abdominal bruit.     Palpations: Abdomen is soft. There is no hepatomegaly, splenomegaly, mass or pulsatile mass.     Tenderness: There is no abdominal tenderness.   Musculoskeletal: Normal range of motion.  Lymphadenopathy:     Cervical: No cervical adenopathy.  Skin:    General: Skin is warm and dry.     Comments: Right index finger has 1cm open sore on lateral side at DIP joint. Entire tip of right index finger has open erythematous sore.    Neurological:     Mental Status: She is alert and oriented to person, place, and time.     Deep Tendon Reflexes: Reflexes are normal and symmetric.  Psychiatric:        Behavior: Behavior normal.        Thought Content: Thought  content normal.        Judgment: Judgment normal.     BP 136/60   Pulse 73   Temp 99.1 F (37.3 C) (Temporal)   Resp 16   Ht _0  (1.575 m)   Wt 118 lb (53.5 kg)   SpO2 98%   BMI 21.58 kg/m        Assessment & Plan:  RABIAH GOESER comes in today with chief complaint of Medical Management of Chronic Issues   Diagnosis and orders addressed:  1. Essential hypertension Low sodium diet - CMP14+EGFR  2. Hypothyroidism, unspecified type - SYNTHROID 75 MCG tablet; Take 1 tablet (75 mcg total) by mouth daily before breakfast.  Dispense: 90 tablet; Refill: 1  3. Hyperlipidemia with target LDL less than 100 *low fat diet** - Lipid panel  4. Recurrent major depressive disorder, in full remission ([place]) stres management - FLUoxetine (PROZAC) 40 MG capsule; Take 1 capsule (40 mg total) by mouth daily.  Dispense: 90 capsule; Refill: 1 - clonazePAM (KLONOPIN) 0.5 MG tablet; Take 1 tablet (0.5 mg total) by mouth 2 (two) times daily.  Dispense: 60 tablet; Refill: 2  5. Gastroesophageal reflux disease without esophagitis Avoid spicy foods Do not eat 2 hours prior to bedtime - CBC with Differential/Platelet - omeprazole (PRILOSEC) 20 MG capsule; TAKE 1 TABLET DAILY  Dispense: 90 capsule; Refill: 1  6. SCLERODERMA Keep every 6 month follow up with Dr. Amil Amen - CBC with Differential/Platelet - CK - Sedimentation rate - C-reactive protein  7. [name]  with gangrene ([place]) continueto keep fingers warm. Keep lesions covered. - sildenafil (REVATIO) 20 MG tablet; Take 1 tablet (20 mg total) by mouth 3 (three) times daily.  Dispense: 90 tablet; Refill: 3   Labs pending Health Maintenance reviewed Diet and exercise encouraged  Follow up plan: 3 month   [place], FNP

## 2019-04-15 LAB — SEDIMENTATION RATE

## 2019-04-18 LAB — CBC WITH DIFFERENTIAL/PLATELET
Basophils Absolute: 0.1 10*3/uL (ref 0.0–0.2)
Basos: 1 %
EOS (ABSOLUTE): 0.3 10*3/uL (ref 0.0–0.4)
Eos: 3 %
Hematocrit: 35.8 % (ref 34.0–46.6)
Hemoglobin: 11.4 g/dL (ref 11.1–15.9)
Immature Grans (Abs): 0 10*3/uL (ref 0.0–0.1)
Immature Granulocytes: 0 %
Lymphocytes Absolute: 2 10*3/uL (ref 0.7–3.1)
Lymphs: 27 %
MCH: 28.1 pg (ref 26.6–33.0)
MCHC: 31.8 g/dL (ref 31.5–35.7)
MCV: 88 fL (ref 79–97)
Monocytes Absolute: 0.6 10*3/uL (ref 0.1–0.9)
Monocytes: 8 %
Neutrophils Absolute: 4.4 10*3/uL (ref 1.4–7.0)
Neutrophils: 61 %
Platelets: 338 10*3/uL (ref 150–450)
RBC: 4.06 x10E6/uL (ref 3.77–5.28)
RDW: 15.5 % — ABNORMAL HIGH (ref 11.7–15.4)
WBC: 7.3 10*3/uL (ref 3.4–10.8)

## 2019-04-18 LAB — CMP14+EGFR
ALT: 7 IU/L (ref 0–32)
AST: 14 IU/L (ref 0–40)
Albumin/Globulin Ratio: 1.7 (ref 1.2–2.2)
Albumin: 4.3 g/dL (ref 3.7–4.7)
Alkaline Phosphatase: 52 IU/L (ref 39–117)
BUN/Creatinine Ratio: 14 (ref 12–28)
BUN: 13 mg/dL (ref 8–27)
Bilirubin Total: <0.2 mg/dL (ref 0.0–1.2)
CO2: 22 mmol/L (ref 20–29)
Calcium: 9 mg/dL (ref 8.7–10.3)
Chloride: 105 mmol/L (ref 96–106)
Creatinine, Ser: 0.9 mg/dL (ref 0.57–1.00)
GFR calc Af Amer: 71 mL/min/{1.73_m2} (ref >59)
GFR calc non Af Amer: 62 mL/min/{1.73_m2} (ref >59)
Globulin, Total: 2.6 g/dL (ref 1.5–4.5)
Glucose: 92 mg/dL (ref 65–99)
Potassium: 3.9 mmol/L (ref 3.5–5.2)
Sodium: 139 mmol/L (ref 134–144)
Total Protein: 6.9 g/dL (ref 6.0–8.5)

## 2019-04-18 LAB — THYROID PANEL WITH TSH
Free Thyroxine Index: 2.7 (ref 1.2–4.9)
T3 Uptake Ratio: 28 % (ref 24–39)
T4, Total: 9.6 ug/dL (ref 4.5–12.0)
TSH: 1.02 u[IU]/mL (ref 0.450–4.500)

## 2019-04-18 LAB — LIPID PANEL
Chol/HDL Ratio: 4.6 ratio — ABNORMAL HIGH (ref 0.0–4.4)
Cholesterol, Total: 225 mg/dL — ABNORMAL HIGH (ref 100–199)
HDL: 49 mg/dL (ref >39)
LDL Chol Calc (NIH): 146 mg/dL — ABNORMAL HIGH (ref 0–99)
Triglycerides: 167 mg/dL — ABNORMAL HIGH (ref 0–149)
VLDL Cholesterol Cal: 30 mg/dL (ref 5–40)

## 2019-04-18 LAB — CK: Total CK: 102 U/L (ref 32–182)

## 2019-04-18 LAB — C-REACTIVE PROTEIN: CRP: 1 mg/L (ref 0–10)

## 2019-04-21 ENCOUNTER — Other Ambulatory Visit: Payer: Self-pay

## 2019-04-21 DIAGNOSIS — M791 Myalgia, unspecified site: Secondary | ICD-10-CM

## 2019-04-22 ENCOUNTER — Other Ambulatory Visit: Payer: Medicare Other

## 2019-04-22 ENCOUNTER — Other Ambulatory Visit: Payer: Self-pay

## 2019-04-22 DIAGNOSIS — M791 Myalgia, unspecified site: Secondary | ICD-10-CM | POA: Diagnosis not present

## 2019-04-23 LAB — SEDIMENTATION RATE: Sed Rate: 23 mm/hr (ref 0–40)

## 2019-04-25 ENCOUNTER — Ambulatory Visit: Payer: Self-pay | Admitting: Nurse Practitioner

## 2019-04-26 ENCOUNTER — Ambulatory Visit (INDEPENDENT_AMBULATORY_CARE_PROVIDER_SITE_OTHER): Payer: Medicare Other | Admitting: *Deleted

## 2019-04-26 DIAGNOSIS — Z0000 Encounter for general adult medical examination without abnormal findings: Secondary | ICD-10-CM

## 2019-04-26 NOTE — Progress Notes (Addendum)
MEDICARE ANNUAL WELLNESS VISIT  04/26/2019  Telephone Visit Disclaimer This Medicare AWV was conducted by telephone due to national recommendations for restrictions regarding the COVID-19 Pandemic (e.g. social distancing).  I verified, using two identifiers, that I am speaking with Haley Trevino or their authorized healthcare agent. I discussed the limitations, risks, security, and privacy concerns of performing an evaluation and management service by telephone and the potential availability of an in-person appointment in the future. The patient expressed understanding and agreed to proceed.   Subjective:  Haley Trevino is a 78 y.o. female patient of [name], Shannon who had a Medicare Annual Wellness Visit today via telephone. Tyjanae is Retired and lives with their spouse. she has 1living child. she reports that she is socially active and does interact with friends/family regularly. she is minimally physically active and enjoys sewing, reading and her 3 cats.  Patient Care Team: [name], FNP as PCP - General (Nurse Practitioner) Clarene Essex, MD as Consulting Physician (Gastroenterology) Philemon Kingdom, MD as Consulting Physician (Internal Medicine) Hennie Duos, MD as Consulting Physician (Rheumatology) Peggyann Juba, MD as Referring Physician (Orthopedic Surgery) Artis Delay, MD as Referring Physician (Internal Medicine) Justice Britain, MD as Consulting Physician (Orthopedic Surgery) Sanjuana Kava, MD as Consulting Physician (Orthopedic Surgery) Barrett, Felisa Bonier as Physician Assistant (Cardiology)  Advanced Directives 04/20/2018 07/31/2017 03/26/2017 04/23/2016 04/16/2016 02/13/2015 01/03/2015  Does Patient Have a Medical Advance Directive? No No No No No Yes Yes  Type of Advance Directive - - - - - Catering manager  Does patient want to make changes to medical advance directive? - No - Patient  declined - - - - -  Copy of Press photographer in Chart? - - - - - - -  Would patient like information on creating a medical advance directive? Yes (MAU/Ambulatory/Procedural Areas - Information given) No - Patient declined Yes (MAU/Ambulatory/Procedural Areas - Information given) No - patient declined information - - [facility] Utilization Over the Past 12 Months: # of hospitalizations or ER visits: 0 # of surgeries: 0  Review of Systems    Patient reports that her overall health is worse compared to last year.  History obtained from chart review and the patient  Patient Reported Readings (BP, Pulse, CBG, Weight, etc) none  Pain Assessment Pain : 0-10 Pain Score: 5  Pain Type: Chronic pain Pain Location: Back Pain Orientation: Lower Pain Descriptors / Indicators: Constant Pain Onset: More than a month ago Pain Frequency: Constant Pain Relieving Factors: moving AROUND Effect of Pain on Daily Activities: VERY LITTLE  Pain Relieving Factors: moving AROUND  Current Medications & Allergies (verified) Allergies as of 04/26/2019      Reactions   Codeine Shortness Of Breath   REACTION: dyspnea   Dilaudid [hydromorphone Hcl] Anaphylaxis   Hydromorphone Anaphylaxis   Acyclovir And Related    Crestor [rosuvastatin Calcium] Other (See Comments)   Muscle pain   Gabapentin Swelling   Swelling in feet, and in legs   Aleve [naproxen Sodium] Rash   Lyrica [pregabalin] Other (See Comments)   Swelling in feet, and in legs      Medication List       Accurate as of April 26, 2019  3:10 PM. If you have any questions, ask your nurse or doctor.        clonazePAM 0.5 MG tablet Commonly known as: KLONOPIN Take 1 tablet (0.5 mg total) by  mouth 2 (two) times daily.   FLUoxetine 40 MG capsule Commonly known as: PROZAC Take 1 capsule (40 mg total) by mouth daily.   meclizine 25 MG tablet Commonly known as: ANTIVERT Take 1 tablet (25 mg total) by mouth 3 (three)  times daily as needed for dizziness.   NIFEdipine 60 MG 24 hr tablet Commonly known as: PROCARDIA XL/NIFEDICAL XL   ofloxacin 0.3 % ophthalmic solution Commonly known as: OCUFLOX   omeprazole 20 MG capsule Commonly known as: PRILOSEC TAKE 1 TABLET DAILY   prednisoLONE acetate 1 % ophthalmic suspension Commonly known as: PRED FORTE   sildenafil 20 MG tablet Commonly known as: REVATIO Take 1 tablet (20 mg total) by mouth 3 (three) times daily.   Slow Release Iron 45 MG Tbcr Generic drug: Ferrous Sulfate Dried Take 45 mg by mouth daily.   Synthroid 75 MCG tablet Generic drug: levothyroxine Take 1 tablet (75 mcg total) by mouth daily before breakfast.   Vitamin D3 50 MCG (2000 UT) Tabs Take 2,000 Units by mouth daily.       History (reviewed): Past Medical History:  Diagnosis Date  . Cataract   . Dyspnea   . Osteoarthritis   . Raynaud disease   . Scleroderma ([place])   . Thyroid disease    hypothyroidism   Past Surgical History:  Procedure Laterality Date  . BREAST ENHANCEMENT SURGERY  1975  . BREAST IMPLANT REMOVAL Bilateral 04/23/2016   Procedure: REMOVALBILATERAL BREAST IMPLANTS;  Surgeon: Wallace Going, DO;  Location: [place];  Service: Plastics;  Laterality: Bilateral;  . CHOLECYSTECTOMY  1973  . HAND SURGERY  08/2012; 11/2012  . IR RADIOLOGIST EVAL & MGMT  07/27/2017  . IR SACROPLASTY BILATERAL  07/31/2017  . MELANOMA EXCISION     at 31 yrs of age  . ORIF HIP FRACTURE Right 02/22/2014   Procedure: OPEN REDUCTION INTERNAL FIXATION RIGHT HIP;  Surgeon: Sanjuana Kava, MD;  Location: [facility];  Service: Orthopedics;  Laterality: Right;  . TOTAL ABDOMINAL HYSTERECTOMY  1974   Family History  Problem Relation Age of Onset  . Pancreatic cancer Father   . Raynaud syndrome Father   . Cancer Father        pancreatic  . Rashes / Skin problems Daughter        possibly scleraderma  . Hip fracture Mother   . Mental illness Mother        attempted  suicide at 61 yo   Social History   Socioeconomic History  . Marital status: Married    Spouse name: Not on file  . Number of children: 2  . Years of education: Not on file  . Highest education level: Some college, no degree  Occupational History  . Occupation: retired Occupational psychologist at [employer]  . Financial resource strain: Not hard at all  . Food insecurity    Worry: Never true    Inability: Never true  . Transportation needs    Medical: Yes    Non-medical: Yes  Tobacco Use  . Smoking status: Never Smoker  . Smokeless tobacco: Never Used  . Tobacco comment: passive tobacco smoke exposure as child  Substance and Sexual Activity  . Alcohol use: No  . Drug use: No  . Sexual activity: Not Currently    Birth control/protection: Surgical  Lifestyle  . Physical activity    Days per week: 0 days    Minutes per session: 0 min  . Stress: Very much  Relationships  . Social connections    Talks on phone: More than three times a week    Gets together: Once a week    Attends religious service: 1 to 4 times per year    Active member of club or organization: Yes    Attends meetings of clubs or organizations: Never    Relationship status: Married  Other Topics Concern  . Not on file  Social History Narrative   Regular exercise: walking   Caffeine use: 1 cup of coffee daily    Activities of Daily Living In your present state of health, do you have any difficulty performing the following activities: 04/26/2019  Hearing? Y  Comment some, not quite ready for hearing aids  Vision? N  Difficulty concentrating or making decisions? N  Walking or climbing stairs? N  Dressing or bathing? N  Doing errands, shopping? N  Preparing Food and eating ? Y  Comment hands hurt and are numb  Using the Toilet? N  In the past six months, have you accidently leaked urine? N  Do you have problems with loss of bowel control? N  Managing your Medications? N  Managing your  Finances? N  Housekeeping or managing your Housekeeping? N  Some recent data might be hidden    Patient Education/ Literacy How often do you need to have someone help you when you read instructions, pamphlets, or other written materials from your doctor or pharmacy?: 1 - Never What is the last grade level you completed in school?: 12  Exercise Current Exercise Habits: The patient does not participate in regular exercise at present  Diet Patient reports consuming 2 meals a day and 1 snack(s) a day Patient reports that her primary diet is: Regular Patient reports that she does have regular access to food.   Depression Screen PHQ 2/9 Scores 04/26/2019 04/14/2019 01/13/2019 10/12/2018 07/27/2018 07/12/2018 05/11/2018  PHQ - 2 Score 2 0 2 2 0 0 0  PHQ- 9 Score 9 - 5 4 - - -  Exception Documentation - - - - - - -     Fall Risk Fall Risk  04/26/2019 04/14/2019 01/13/2019 07/27/2018 07/12/2018  Falls in the past year? 1 1 1 1 1   Number falls in past yr: 1 0 0 0 0  Injury with Fall? 0 0 0 0 0  Comment - - - - -  Risk Factor Category  - - - - -  Risk for fall due to : - - - - -  Follow up - - - - -  Comment - - - - -     Objective:  Haley Trevino seemed alert and oriented and she participated appropriately during our telephone visit.  Blood Pressure Weight BMI  BP Readings from Last 3 Encounters:  04/14/19 136/60  01/13/19 (!) 154/62  07/27/18 131/62   Wt Readings from Last 3 Encounters:  04/14/19 118 lb (53.5 kg)  01/13/19 119 lb (54 kg)  07/27/18 116 lb 9.6 oz (52.9 kg)   BMI Readings from Last 1 Encounters:  04/14/19 21.58 kg/m    *Unable to obtain current vital signs, weight, and BMI due to telephone visit type  Hearing/Vision  . Beyza did not seem to have difficulty with hearing/understanding during the telephone conversation . Reports that she has had a formal eye exam by an eye care professional within the past year . Reports that she has had a formal hearing evaluation  within the past year *Unable to fully assess  hearing and vision during telephone visit type  Cognitive Function: 6CIT Screen 04/26/2019  What Year? 0 points  What month? 0 points  What time? 0 points  Count back from 20 0 points  Months in reverse 0 points  Repeat phrase 0 points  Total Score 0   (Normal:0-7, Significant for Dysfunction: >8)  Normal Cognitive Function Screening: Yes   Immunization & Health Maintenance Record Immunization History  Administered Date(s) Administered  . Fluad Quad(high Dose 65+) 04/14/2019  . Influenza, High Dose Seasonal PF 04/08/2018  . Influenza,inj,Quad PF,6+ Mos 04/06/2013, 04/19/2014, 04/19/2015, 03/25/2016, 04/02/2017  . Pneumococcal Conjugate-13 06/02/2014  . Pneumococcal Polysaccharide-23 06/25/2011  . Tdap 07/07/2005, 03/26/2017  . Zoster 08/06/2009  . Zoster Recombinat (Shingrix) 04/20/2018, 06/23/2018    Health Maintenance  Topic Date Due  . DEXA SCAN  09/15/2019 (Originally 10/10/2018)  . COLONOSCOPY  04/13/2020 (Originally 06/23/2018)  . TETANUS/TDAP  03/27/2027  . INFLUENZA VACCINE  Completed  . PNA vac Low Risk Adult  Completed       Assessment  This is a routine wellness examination for Haley Trevino.  Health Maintenance: Due or Overdue There are no preventive care reminders to display for this patient.  Haley Trevino does not need a referral for Community Assistance: Care Management:   no Social Work:    no Prescription Assistance:  no Nutrition/Diabetes Education:  no   Plan:  Personalized Goals Goals Addressed   None    Personalized Health Maintenance & Screening Recommendations  Bone densitometry screening Colorectal cancer screening  Lung Cancer Screening Recommended: no (Low Dose CT Chest recommended if Age 78-80 years, 30 pack-year currently smoking OR have quit w/in past 15 years) Hepatitis C Screening recommended: no HIV Screening recommended: no  Advanced Directives: Written  information was not prepared per patient's request.  Referrals & Orders No orders of the defined types were placed in this encounter.   Follow-up Plan . Follow-up with [name], FNP as planned . Schedule  . Patient appears to be in overall good health. She is not able to do some of her ADL's because of her hand pain and numbness. Her husband helps her with cooking and cleaning. . She just had an eye exam this month and a hearing exam earlier this year. She plans on getting hearing aids in the future.  . She had declined Dexa and Colonoscopy, but we will reopen these topics at next office visit.   I have personally reviewed and noted the following in the patient's chart:   . Medical and social history . Use of alcohol, tobacco or illicit drugs  . Current medications and supplements . Functional ability and status . Nutritional status . Physical activity . Advanced directives . List of other physicians . Hospitalizations, surgeries, and ER visits in previous 12 months . Vitals . Screenings to include cognitive, depression, and falls . Referrals and appointments  In addition, I have reviewed and discussed with Haley Trevino certain preventive protocols, quality metrics, and best practice recommendations. A written personalized care plan for preventive services as well as general preventive health recommendations is available and can be mailed to the patient at her request.      Rana Snare, LPN  [id number]   I have reviewed and agree with the above AWV documentation.   Mary-Margaret Hassell Done, FNP

## 2019-05-02 ENCOUNTER — Telehealth: Payer: Self-pay | Admitting: Nurse Practitioner

## 2019-05-02 MED ORDER — NIFEDIPINE ER OSMOTIC RELEASE 60 MG PO TB24: 60.0000 mg | ORAL_TABLET | Freq: Every day | ORAL | 1 refills | Status: DC

## 2019-05-02 NOTE — Telephone Encounter (Signed)
What is the name of the medication? NIFEdipine (PROCARDIA XL/NIFEDICAL XL) 60 MG 24 hr tablet  Have you contacted your pharmacy to request a refill? yes  Which pharmacy would you like this sent to? [facility]   Patient notified that their request is being sent to the clinical staff for review and that they should receive a call once it is complete. If they do not receive a call within 24 hours they can check with their pharmacy or our office.

## 2019-05-02 NOTE — Telephone Encounter (Signed)
Last labs and office visit early this month.  Script has not been filled by current pcp.  Please review and advise.

## 2019-05-17 ENCOUNTER — Encounter: Payer: Self-pay | Admitting: Nurse Practitioner

## 2019-05-17 ENCOUNTER — Ambulatory Visit (INDEPENDENT_AMBULATORY_CARE_PROVIDER_SITE_OTHER): Payer: Medicare Other | Admitting: Nurse Practitioner

## 2019-05-17 ENCOUNTER — Other Ambulatory Visit: Payer: Self-pay

## 2019-05-17 DIAGNOSIS — R52 Pain, unspecified: Secondary | ICD-10-CM | POA: Diagnosis not present

## 2019-05-17 DIAGNOSIS — Z20822 Contact with and (suspected) exposure to covid-19: Secondary | ICD-10-CM

## 2019-05-17 DIAGNOSIS — R519 Headache, unspecified: Secondary | ICD-10-CM

## 2019-05-17 DIAGNOSIS — Z20828 Contact with and (suspected) exposure to other viral communicable diseases: Secondary | ICD-10-CM

## 2019-05-17 DIAGNOSIS — R42 Dizziness and giddiness: Secondary | ICD-10-CM | POA: Diagnosis not present

## 2019-05-17 NOTE — Progress Notes (Signed)
   Virtual Visit via telephone Note Due to COVID-19 pandemic this visit was conducted virtually. This visit type was conducted due to national recommendations for restrictions regarding the COVID-19 Pandemic (e.g. social distancing, sheltering in place) in an effort to limit this patient's exposure and mitigate transmission in our community. All issues noted in this document were discussed and addressed.  A physical exam was not performed with this format.  I connected with Haley Trevino on 05/17/19 at 9:20 by telephone and verified that I am speaking with the correct person using two identifiers. Haley Trevino is currently located at home and her husband is currently with her during visit. The provider, Mary-Margaret Hassell Done, FNP is located in their office at time of visit.  I discussed the limitations, risks, security and privacy concerns of performing an evaluation and management service by telephone and the availability of in person appointments. I also discussed with the patient that there may be a patient responsible charge related to this service. The patient expressed understanding and agreed to proceed.   History and Present Illness:   Chief Complaint: Headache, Dizziness, and Generalized Body Aches   HPI Patient calls in c/o body aches, so severe that she can hardly move. Headache and dizziness. The headache and dizziness started several days ago. She says she just feels terrible. She does not think she has been exposed to covid but not sure.    Review of Systems  Constitutional: Negative for diaphoresis and weight loss.  Eyes: Negative for blurred vision, double vision and pain.  Respiratory: Negative for shortness of breath.   Cardiovascular: Negative for chest pain, palpitations, orthopnea and leg swelling.  Gastrointestinal: Negative for abdominal pain.  Musculoskeletal: Positive for myalgias.  Skin: Negative for rash.  Neurological: Positive for dizziness and  headaches. Negative for sensory change, loss of consciousness and weakness.  Endo/Heme/Allergies: Negative for polydipsia. Does not bruise/bleed easily.  Psychiatric/Behavioral: Negative for memory loss. The patient does not have insomnia.   All other systems reviewed and are negative.    Observations/Objective: Alert and oriented- answers all questions appropriately moderate distress    Assessment and Plan: Haley Trevino in today with chief complaint of Headache, Dizziness, and Generalized Body Aches   1. Acute nonintractable headache, unspecified headache type  2. Body aches  3. Dizziness  4. Suspected COVID-19 virus infection To [facility] They will give her information about what to do while waiting on test results   Follow Up Instructions: prn    I discussed the assessment and treatment plan with the patient. The patient was provided an opportunity to ask questions and all were answered. The patient agreed with the plan and demonstrated an understanding of the instructions.   The patient was advised to call back or seek an in-person evaluation if the symptoms worsen or if the condition fails to improve as anticipated.  The above assessment and management plan was discussed with the patient. The patient verbalized understanding of and has agreed to the management plan. Patient is aware to call the clinic if symptoms persist or worsen. Patient is aware when to return to the clinic for a follow-up visit. Patient educated on when it is appropriate to go to the emergency department.   Time call ended:  9:32  I provided 12 minutes of non-face-to-face time during this encounter.    Mary-Margaret Hassell Done, FNP

## 2019-05-18 LAB — NOVEL CORONAVIRUS, NAA: SARS-CoV-2, NAA: NOT DETECTED

## 2019-05-23 ENCOUNTER — Telehealth: Payer: Self-pay | Admitting: *Deleted

## 2019-05-23 NOTE — Telephone Encounter (Signed)
Patient's husband will be having a biopsy done at [facility] in the radiology dept on tomorrow. They are requesting  her COVID  test results faxed to [phone] CB# [phone]

## 2019-07-21 ENCOUNTER — Other Ambulatory Visit: Payer: Self-pay

## 2019-07-22 ENCOUNTER — Encounter: Payer: Self-pay | Admitting: Nurse Practitioner

## 2019-07-22 ENCOUNTER — Ambulatory Visit (INDEPENDENT_AMBULATORY_CARE_PROVIDER_SITE_OTHER): Payer: Medicare Other | Admitting: Nurse Practitioner

## 2019-07-22 VITALS — BP 151/66 | HR 74 | Temp 99.3°F | Resp 20 | Ht 62.0 in | Wt 117.0 lb

## 2019-07-22 DIAGNOSIS — F3342 Major depressive disorder, recurrent, in full remission: Secondary | ICD-10-CM | POA: Diagnosis not present

## 2019-07-22 DIAGNOSIS — I10 Essential (primary) hypertension: Secondary | ICD-10-CM | POA: Diagnosis not present

## 2019-07-22 DIAGNOSIS — M810 Age-related osteoporosis without current pathological fracture: Secondary | ICD-10-CM

## 2019-07-22 DIAGNOSIS — I7300 Raynaud's syndrome without gangrene: Secondary | ICD-10-CM | POA: Diagnosis not present

## 2019-07-22 DIAGNOSIS — K219 Gastro-esophageal reflux disease without esophagitis: Secondary | ICD-10-CM

## 2019-07-22 DIAGNOSIS — M349 Systemic sclerosis, unspecified: Secondary | ICD-10-CM | POA: Diagnosis not present

## 2019-07-22 DIAGNOSIS — E039 Hypothyroidism, unspecified: Secondary | ICD-10-CM | POA: Diagnosis not present

## 2019-07-22 DIAGNOSIS — I7301 Raynaud's syndrome with gangrene: Secondary | ICD-10-CM

## 2019-07-22 DIAGNOSIS — H9201 Otalgia, right ear: Secondary | ICD-10-CM

## 2019-07-22 DIAGNOSIS — E785 Hyperlipidemia, unspecified: Secondary | ICD-10-CM | POA: Diagnosis not present

## 2019-07-22 MED ORDER — SYNTHROID 75 MCG PO TABS: 75.0000 ug | ORAL_TABLET | Freq: Every day | ORAL | 1 refills | Status: DC

## 2019-07-22 MED ORDER — CLONAZEPAM 0.5 MG PO TABS: 0.5000 mg | ORAL_TABLET | Freq: Two times a day (BID) | ORAL | 2 refills | Status: DC

## 2019-07-22 MED ORDER — FLUOXETINE HCL 40 MG PO CAPS: 40.0000 mg | ORAL_CAPSULE | Freq: Every day | ORAL | 1 refills | Status: DC

## 2019-07-22 MED ORDER — OMEPRAZOLE 20 MG PO CPDR: DELAYED_RELEASE_CAPSULE | ORAL | 1 refills | Status: DC

## 2019-07-22 MED ORDER — CIPROFLOXACIN-DEXAMETHASONE 0.3-0.1 % OT SUSP: 4.0000 [drp] | Freq: Two times a day (BID) | OTIC | 0 refills | Status: DC

## 2019-07-22 MED ORDER — SILDENAFIL CITRATE 20 MG PO TABS: 20.0000 mg | ORAL_TABLET | Freq: Three times a day (TID) | ORAL | 3 refills | Status: DC

## 2019-07-22 MED ORDER — NIFEDIPINE ER OSMOTIC RELEASE 60 MG PO TB24: 60.0000 mg | ORAL_TABLET | Freq: Every day | ORAL | 1 refills | Status: DC

## 2019-07-22 NOTE — Patient Instructions (Signed)
Raynaud Phenomenon ° °Raynaud phenomenon is a condition that affects the blood vessels (arteries) that carry blood to your fingers and toes. The arteries that supply blood to your ears, lips, nipples, or the tip of your nose might also be affected. Raynaud phenomenon causes the arteries to become narrow temporarily (spasm). As a result, the flow of blood to the affected areas is temporarily decreased. This usually occurs in response to cold temperatures or stress. During an attack, the skin in the affected areas turns white, then blue, and finally red. You may also feel tingling or numbness in those areas. °Attacks usually last for only a brief period, and then the blood flow to the area returns to normal. In most cases, Raynaud phenomenon does not cause serious health problems. °What are the causes? °In many cases, the cause of this condition is not known. The condition may occur on its own (primary Raynaud phenomenon) or may be associated with other diseases or factors (secondary Raynaud phenomenon). °Possible causes may include: °· Diseases or medical conditions that damage the arteries. °· Injuries and repetitive actions that hurt the hands or feet. °· Being exposed to certain chemicals. °· Taking medicines that narrow the arteries. °· Other medical conditions, such as lupus, scleroderma, rheumatoid arthritis, thyroid problems, blood disorders, Sjogren syndrome, or atherosclerosis. °What increases the risk? °The following factors may make you more likely to develop this condition: °· Being 20-40 years old. °· Being female. °· Having a family history of Raynaud phenomenon. °· Living in a cold climate. °· Smoking. °What are the signs or symptoms? °Symptoms of this condition usually occur when you are exposed to cold temperatures or when you have emotional stress. The symptoms may last for a few minutes or up to several hours. They usually affect your fingers but may also affect your toes, nipples, lips, ears, or  the tip of your nose. Symptoms may include: °· Changes in skin color. The skin in the affected areas will turn pale or white. The skin may then change from white to bluish to red as normal blood flow returns to the area. °· Numbness, tingling, or pain in the affected areas. °In severe cases, symptoms may include: °· Skin sores. °· Tissues decaying and dying (gangrene). °How is this diagnosed? °This condition may be diagnosed based on: °· Your symptoms and medical history. °· A physical exam. During the exam, you may be asked to put your hands in cold water to check for a reaction to cold temperature. °· Tests, such as: °? Blood tests to check for other diseases or conditions. °? A test to check the movement of blood through your arteries and veins (vascular ultrasound). °? A test in which the skin at the base of your fingernail is examined under a microscope (nailfold capillaroscopy). °How is this treated? °Treatment for this condition often involves making lifestyle changes and taking steps to control your exposure to cold temperatures. For more severe cases, medicine (calcium channel blockers) may be used to improve blood flow. Surgery is sometimes done to block the nerves that control the affected arteries, but this is rare. °Follow these instructions at home: °Avoiding cold temperatures °Take these steps to avoid exposure to cold: °· If possible, stay indoors during cold weather. °· When you go outside during cold weather, dress in layers and wear mittens, a hat, a scarf, and warm footwear. °· Wear mittens or gloves when handling ice or frozen food. °· Use holders for glasses or cans containing cold drinks. °·   Let warm water run for a while before taking a shower or bath. °· Warm up the car before driving in cold weather. °Lifestyle ° °· If possible, avoid stressful and emotional situations. Try to find ways to manage your stress, such as: °? Exercise. °? Yoga. °? Meditation. °? Biofeedback. °· Do not use any  products that contain nicotine or tobacco, such as cigarettes and e-cigarettes. If you need help quitting, ask your health care provider. °· Avoid secondhand smoke. °· Limit your use of caffeine. °? Switch to decaffeinated coffee, tea, and soda. °? Avoid chocolate. °· Avoid vibrating tools and machinery. °General instructions °· Protect your hands and feet from injuries, cuts, or bruises. °· Avoid wearing tight rings or wristbands. °· Wear loose fitting socks and comfortable, roomy shoes. °· Take over-the-counter and prescription medicines only as told by your health care provider. °Contact a health care provider if: °· Your discomfort becomes worse despite lifestyle changes. °· You develop sores on your fingers or toes that do not heal. °· Your fingers or toes turn black. °· You have breaks in the skin on your fingers or toes. °· You have a fever. °· You have pain or swelling in your joints. °· You have a rash. °· Your symptoms occur on only one side of your body. °Summary °· Raynaud phenomenon is a condition that affects the arteries that carry blood to your fingers, toes, ears, lips, nipples, or the tip of your nose. °· In many cases, the cause of this condition is not known. °· Symptoms of this condition include changes in skin color, and numbness and tingling of the affected area. °· Treatment for this condition includes lifestyle changes, reducing exposure to cold temperatures, and using medicines for severe cases of the condition. °· Contact your health care provider if your condition worsens despite treatment. °This information is not intended to replace advice given to you by your health care provider. Make sure you discuss any questions you have with your health care provider. °Document Revised: 06/26/2017 Document Reviewed: 08/04/2016 °Elsevier Patient Education © 2020 [employer] ° °

## 2019-07-22 NOTE — Progress Notes (Signed)
Subjective:    Patient ID: Haley Trevino, female    DOB: 27-Sep-1940, 79 y.o.   MRN: [id number]   Chief Complaint: Medical Management of Chronic Issues    HPI:  1. Essential hypertension No c/o chest pain, sob or headache. Does not check blood pressure at home. BP Readings from Last 3 Encounters:  07/22/19 (!) 151/66  04/14/19 136/60  01/13/19 (!) 154/62    2. Gastroesophageal reflux disease without esophagitis Takes omeprazole daily, and works well to keep symptoms under control.   3. Hypothyroidism, unspecified type No problems that aware of  4. Hyperlipidemia with target LDL less than 100 Tries to watch diet, but does very little exercise. Refuses statin therapy due to leg pain. She is on red yeast rice daily. Lab Results  Component Value Date   CHOL 225 (H) 04/14/2019   HDL 49 04/14/2019   LDLCALC 146 (H) 04/14/2019   TRIG 167 (H) 04/14/2019   CHOLHDL 4.6 (H) 04/14/2019    5. Age-related osteoporosis without current pathological fracture Needs dexascan- will schedule  6. SCLERODERMA See rheumatology every 3 months. Is currently maintaining. No recent changes made to plan of care  7. Recurrent major depressive disorder, in full remission ([place]) Is on combination of klonopin and prozac daily. She says she is dong well. Depression screen [facility] 2/9 07/22/2019 04/26/2019 04/14/2019  Decreased Interest 0 1 0  Down, Depressed, Hopeless 0 1 0  PHQ - 2 Score 0 2 0  Altered sleeping - 1 -  Tired, decreased energy - 1 -  Change in appetite - 1 -  Feeling bad or failure about yourself  - 1 -  Trouble concentrating - 1 -  Moving slowly or fidgety/restless - 1 -  Suicidal thoughts - 1 -  PHQ-9 Score - 9 -  Difficult doing work/chores - - -  Some recent data might be hidden   8. Raynauds disease Has constant sores on fingers. Of course it is worse in winter then in summer. She wears gloves all the time to keep hands warm. Sh etakes viagra to help with  circulation.   Outpatient Encounter Medications as of 07/22/2019  Medication Sig  . Cholecalciferol (VITAMIN D3) 2000 units TABS Take 2,000 Units by mouth daily.  . clonazePAM (KLONOPIN) 0.5 MG tablet Take 1 tablet (0.5 mg total) by mouth 2 (two) times daily.  . Ferrous Sulfate Dried (SLOW RELEASE IRON) 45 MG TBCR Take 45 mg by mouth daily.  Marland Kitchen FLUoxetine (PROZAC) 40 MG capsule Take 1 capsule (40 mg total) by mouth daily.  . meclizine (ANTIVERT) 25 MG tablet Take 1 tablet (25 mg total) by mouth 3 (three) times daily as needed for dizziness. (Patient not taking: Reported on 04/14/2019)  . NIFEdipine (PROCARDIA XL/NIFEDICAL XL) 60 MG 24 hr tablet Take 1 tablet (60 mg total) by mouth daily.  Marland Kitchen ofloxacin (OCUFLOX) 0.3 % ophthalmic solution   . omeprazole (PRILOSEC) 20 MG capsule TAKE 1 TABLET DAILY  . prednisoLONE acetate (PRED FORTE) 1 % ophthalmic suspension   . sildenafil (REVATIO) 20 MG tablet Take 1 tablet (20 mg total) by mouth 3 (three) times daily.  Marland Kitchen SYNTHROID 75 MCG tablet Take 1 tablet (75 mcg total) by mouth daily before breakfast.     Past Surgical History:  Procedure Laterality Date  . BREAST ENHANCEMENT SURGERY  1975  . BREAST IMPLANT REMOVAL Bilateral 04/23/2016   Procedure: REMOVALBILATERAL BREAST IMPLANTS;  Surgeon: Wallace Going, DO;  Location: [place];  Service:  Plastics;  Laterality: Bilateral;  . CHOLECYSTECTOMY  1973  . HAND SURGERY  08/2012; 11/2012  . IR RADIOLOGIST EVAL & MGMT  07/27/2017  . IR SACROPLASTY BILATERAL  07/31/2017  . MELANOMA EXCISION     at 28 yrs of age  . ORIF HIP FRACTURE Right 02/22/2014   Procedure: OPEN REDUCTION INTERNAL FIXATION RIGHT HIP;  Surgeon: Sanjuana Kava, MD;  Location: [facility];  Service: Orthopedics;  Laterality: Right;  . TOTAL ABDOMINAL HYSTERECTOMY  1974    Family History  Problem Relation Age of Onset  . Pancreatic cancer Father   . Raynaud syndrome Father   . Cancer Father        pancreatic  . Rashes /  Skin problems Daughter        possibly scleraderma  . Hip fracture Mother   . Mental illness Mother        attempted suicide at 8 yo    New complaints: Ear pain for over a year  Social history: Lives with her husband  Controlled substance contract: n/a    Review of Systems  Constitutional: Negative.   HENT: Positive for ear pain (right ear- chronic) and hearing loss (decreasing in right ear). Negative for ear discharge.   Respiratory: Negative.   Cardiovascular: Negative.   Gastrointestinal: Negative.   Genitourinary: Negative.   Neurological: Negative.   Psychiatric/Behavioral: Negative.   All other systems reviewed and are negative.      Objective:   Physical Exam Vitals and nursing note reviewed.  Constitutional:      General: She is not in acute distress.    Appearance: Normal appearance. She is well-developed.  HENT:     Head: Normocephalic.     Right Ear: Decreased hearing noted. No drainage.     Left Ear: Hearing, tympanic membrane, ear canal and external ear normal.     Ears:     Comments: narrowing of right ear canal    Nose: Nose normal.  Eyes:     Pupils: Pupils are equal, round, and reactive to light.  Neck:     Vascular: No carotid bruit or JVD.  Cardiovascular:     Rate and Rhythm: Normal rate and regular rhythm.     Heart sounds: Murmur (2/6 systolic) present.  Pulmonary:     Effort: Pulmonary effort is normal. No respiratory distress.     Breath sounds: Normal breath sounds. No wheezing or rales.  Chest:     Chest wall: No tenderness.  Abdominal:     General: Bowel sounds are normal. There is no distension or abdominal bruit.     Palpations: Abdomen is soft. There is no hepatomegaly, splenomegaly, mass or pulsatile mass.     Tenderness: There is no abdominal tenderness.  Musculoskeletal:        General: Normal range of motion.     Cervical back: Normal range of motion and neck supple.  Lymphadenopathy:     Cervical: No cervical  adenopathy.  Skin:    General: Skin is warm and dry.     Comments: Tips of fingers swollen- cold to touch with tender lesion on right ring finger.  Neurological:     Mental Status: She is alert and oriented to person, place, and time.     Deep Tendon Reflexes: Reflexes are normal and symmetric.  Psychiatric:        Behavior: Behavior normal.        Thought Content: Thought content normal.        Judgment:  Judgment normal.     BP (!) 151/66   Pulse 74   Temp 99.3 F (37.4 C) (Temporal)   Resp 20   Ht 5\' 2"  (1.575 m)   Wt 117 lb (53.1 kg)   BMI 21.40 kg/m        Assessment & Plan:  TYRONE TROISE comes in today with chief complaint of Medical Management of Chronic Issues   Diagnosis and orders addressed:  1. Essential hypertension Low sodium diet  2. Gastroesophageal reflux disease without esophagitis Avoid spicy foods Do not eat 2 hours prior to bedtime - omeprazole (PRILOSEC) 20 MG capsule; TAKE 1 TABLET DAILY  Dispense: 90 capsule; Refill: 1  3. Hypothyroidism, unspecified type - SYNTHROID 75 MCG tablet; Take 1 tablet (75 mcg total) by mouth daily before breakfast.  Dispense: 90 tablet; Refill: 1  4. Hyperlipidemia with target LDL less than 100 Low fat diet  5. Age-related osteoporosis without current pathological fracture Will schedule dexasca  6. SCLERODERMA Keep follow up with rheumatologist  7. Recurrent major depressive disorder, in full remission ([place]) Stress management - FLUoxetine (PROZAC) 40 MG capsule; Take 1 capsule (40 mg total) by mouth daily.  Dispense: 90 capsule; Refill: 1 - clonazePAM (KLONOPIN) 0.5 MG tablet; Take 1 tablet (0.5 mg total) by mouth 2 (two) times daily.  Dispense: 60 tablet; Refill: 2  8. [name] without gangrene Keep hands warm - sildenafil (REVATIO) 20 MG tablet; Take 1 tablet (20 mg total) by mouth 3 (three) times daily.  Dispense: 90 tablet; Refill: 3  9. Right ear pain Referral to ent ciprodex 4 gtts  2x a day  Labs pending Health Maintenance reviewed Diet and exercise encouraged  Follow up plan: 3 months   Mary-Margaret Hassell Done, FNP

## 2019-07-23 LAB — CMP14+EGFR
ALT: 9 IU/L (ref 0–32)
AST: 17 IU/L (ref 0–40)
Albumin/Globulin Ratio: 1.5 (ref 1.2–2.2)
Albumin: 4.1 g/dL (ref 3.7–4.7)
Alkaline Phosphatase: 49 IU/L (ref 39–117)
BUN/Creatinine Ratio: 13 (ref 12–28)
BUN: 10 mg/dL (ref 8–27)
Bilirubin Total: <0.2 mg/dL (ref 0.0–1.2)
CO2: 23 mmol/L (ref 20–29)
Calcium: 9.1 mg/dL (ref 8.7–10.3)
Chloride: 104 mmol/L (ref 96–106)
Creatinine, Ser: 0.78 mg/dL (ref 0.57–1.00)
GFR calc Af Amer: 84 mL/min/{1.73_m2} (ref >59)
GFR calc non Af Amer: 73 mL/min/{1.73_m2} (ref >59)
Globulin, Total: 2.7 g/dL (ref 1.5–4.5)
Glucose: 87 mg/dL (ref 65–99)
Potassium: 4.1 mmol/L (ref 3.5–5.2)
Sodium: 139 mmol/L (ref 134–144)
Total Protein: 6.8 g/dL (ref 6.0–8.5)

## 2019-07-23 LAB — LIPID PANEL
Chol/HDL Ratio: 4.1 ratio (ref 0.0–4.4)
Cholesterol, Total: 201 mg/dL — ABNORMAL HIGH (ref 100–199)
HDL: 49 mg/dL (ref >39)
LDL Chol Calc (NIH): 126 mg/dL — ABNORMAL HIGH (ref 0–99)
Triglycerides: 147 mg/dL (ref 0–149)
VLDL Cholesterol Cal: 26 mg/dL (ref 5–40)

## 2019-07-23 LAB — THYROID PANEL WITH TSH
Free Thyroxine Index: 2.5 (ref 1.2–4.9)
T3 Uptake Ratio: 27 % (ref 24–39)
T4, Total: 9.2 ug/dL (ref 4.5–12.0)
TSH: 1.11 u[IU]/mL (ref 0.450–4.500)

## 2019-07-26 ENCOUNTER — Telehealth: Payer: Self-pay | Admitting: *Deleted

## 2019-07-26 NOTE — Telephone Encounter (Signed)
Tried to do PA for Sildenafil Citrate-  The patient currently has access to the requested medication and a Prior Authorization is not needed for the patient/medication.

## 2019-08-16 ENCOUNTER — Ambulatory Visit (INDEPENDENT_AMBULATORY_CARE_PROVIDER_SITE_OTHER): Payer: Medicare Other | Admitting: Family Medicine

## 2019-08-16 ENCOUNTER — Ambulatory Visit (INDEPENDENT_AMBULATORY_CARE_PROVIDER_SITE_OTHER): Payer: Medicare Other

## 2019-08-16 ENCOUNTER — Encounter: Payer: Self-pay | Admitting: Family Medicine

## 2019-08-16 ENCOUNTER — Other Ambulatory Visit: Payer: Self-pay

## 2019-08-16 VITALS — BP 151/62 | HR 77 | Temp 99.6°F | Ht 62.0 in | Wt 118.4 lb

## 2019-08-16 DIAGNOSIS — R0789 Other chest pain: Secondary | ICD-10-CM | POA: Diagnosis not present

## 2019-08-16 DIAGNOSIS — R0781 Pleurodynia: Secondary | ICD-10-CM

## 2019-08-16 DIAGNOSIS — S299XXA Unspecified injury of thorax, initial encounter: Secondary | ICD-10-CM | POA: Diagnosis not present

## 2019-08-16 MED ORDER — HYDROCODONE-ACETAMINOPHEN 5-325 MG PO TABS: 1.0000 | ORAL_TABLET | Freq: Four times a day (QID) | ORAL | 0 refills | Status: AC | PRN

## 2019-08-16 NOTE — Patient Instructions (Signed)
Rib Contusion A rib contusion is a deep bruise on your rib area. Contusions are the result of a blunt trauma that causes bleeding and injury to the tissues under the skin. A rib contusion may involve bruising of the ribs and of the skin and muscles in the area. The skin over the contusion may turn blue, purple, or yellow. Minor injuries will give you a painless contusion. More severe contusions may stay painful and swollen for a few weeks. What are the causes? This condition is usually caused by a blow, trauma, or direct force to an area of the body. This often occurs while playing contact sports. What are the signs or symptoms? Symptoms of this condition include:  Swelling and redness of the injured area.  Discoloration of the injured area.  Tenderness and soreness of the injured area.  Pain with or without movement. How is this diagnosed? This condition may be diagnosed based on:  Your symptoms and medical history.  A physical exam.  Imaging tests--such as an X-ray, CT scan, or MRI--to determine if there were internal injuries or broken bones (fractures). How is this treated? This condition may be treated with:  Rest. This is often the best treatment for a rib contusion.  Icing. This reduces swelling and inflammation.  Deep-breathing exercises. These may be recommended to reduce the risk for lung collapse and pneumonia.  Medicines. Over-the-counter or prescription medicines may be given to control pain.  Injection of a numbing medicine around the nerve near your injury (nerve block). Follow these instructions at home:     Medicines  Take over-the-counter and prescription medicines only as told by your health care provider.  Do not drive or use heavy machinery while taking prescription pain medicine.  If you are taking prescription pain medicine, take actions to prevent or treat constipation. Your health care provider may recommend that you: ? Drink enough fluid to keep  your urine pale yellow. ? Eat foods that are high in fiber, such as fresh fruits and vegetables, whole grains, and beans. ? Limit foods that are high in fat and processed sugars, such as fried or sweet foods. ? Take an over-the-counter or prescription medicine for constipation. Managing pain, stiffness, and swelling  If directed, put ice on the injured area: ? Put ice in a plastic bag. ? Place a towel between your skin and the bag. ? Leave the ice on for 20 minutes, 2-3 times a day.  Rest the injured area. Avoid strenuous activity and any activities or movements that cause pain. Be careful during activities and avoid bumping the injured area.  Do not lift anything that is heavier than 5 lb (2.3 kg), or the limit that you are told, until your health care provider says that it is safe. General instructions  Do not use any products that contain nicotine or tobacco, such as cigarettes and e-cigarettes. These can delay healing. If you need help quitting, ask your health care provider.  Do deep-breathing exercises as told by your health care provider.  If you were given an incentive spirometer, use it every 1-2 hours while you are awake, or as recommended by your health care provider. This device measures how well you are filling your lungs with each breath.  Keep all follow-up visits as told by your health care provider. This is important. Contact a health care provider if you have:  Increased bruising or swelling.  Pain that is not controlled with treatment.  A fever. Get help right away if   you:  Have difficulty breathing or shortness of breath.  Develop a continual cough or you cough up thick or bloody sputum.  Feel nauseous or you vomit.  Have pain in your abdomen. Summary  A rib contusion is a deep bruise on your rib area. Contusions are the result of a blunt trauma that causes bleeding and injury to the tissues under the skin.  The skin overlying the contusion may turn  blue, purple, or yellow. Minor injuries may give you a painless contusion. More severe contusions may stay painful and swollen for a few weeks.  Rest the injured area. Avoid strenuous activity and any activities or movements that cause pain. This information is not intended to replace advice given to you by your health care provider. Make sure you discuss any questions you have with your health care provider. Document Revised: 07/22/2017 Document Reviewed: 07/22/2017 Elsevier Patient Education  2020 [employer]  

## 2019-08-16 NOTE — Progress Notes (Signed)
Assessment & Plan:  1. Rib pain on left side - Education provided on rib contusions. Advised not to take Norco near clonazepam.  - DG Ribs Unilateral W/Chest Left; Future - HYDROcodone-acetaminophen (NORCO) 5-325 MG tablet; Take 1 tablet by mouth every 6 (six) hours as needed for up to 5 days for moderate pain.  Dispense: 20 tablet; Refill: 0   Follow up plan: Return if symptoms worsen or fail to improve.  Hendricks Limes, MSN, APRN, FNP-C [facility]  Subjective:   Patient ID: Haley Trevino, female    DOB: 10/28/1940, 79 y.o.   MRN: [id number]  HPI: Haley Trevino is a 79 y.o. female presenting on 08/16/2019 for Chest Pain (left side- hit ribs sat night in the car)  Patient reports three days ago she was leaning from the backseat to the front to unlock her car doors and she leaned too far causing her to go down hard on the headrest. She feels she heard something crack when this occurred. She reports it hurts constantly but worse to take a deep breath. Pain is described as "hurting" constantly and stabbing with deep breaths. She rates the pain 7-8/10 with the deep breaths. She has been taking Ibuprofen 600 mg a couple of times a day without any relief.    ROS: Negative unless specifically indicated above in HPI.   Relevant past medical history reviewed and updated as indicated.   Allergies and medications reviewed and updated.   Current Outpatient Medications:  .  Cholecalciferol (VITAMIN D3) 2000 units TABS, Take 2,000 Units by mouth daily., Disp: , Rfl:  .  clonazePAM (KLONOPIN) 0.5 MG tablet, Take 1 tablet (0.5 mg total) by mouth 2 (two) times daily., Disp: 60 tablet, Rfl: 2 .  Ferrous Sulfate Dried (SLOW RELEASE IRON) 45 MG TBCR, Take 45 mg by mouth daily., Disp: , Rfl:  .  FLUoxetine (PROZAC) 40 MG capsule, Take 1 capsule (40 mg total) by mouth daily., Disp: 90 capsule, Rfl: 1 .  meclizine (ANTIVERT) 25 MG tablet, Take 1 tablet (25 mg total) by  mouth 3 (three) times daily as needed for dizziness., Disp: 90 tablet, Rfl: 1 .  NIFEdipine (PROCARDIA XL/NIFEDICAL XL) 60 MG 24 hr tablet, Take 1 tablet (60 mg total) by mouth daily., Disp: 90 tablet, Rfl: 1 .  omeprazole (PRILOSEC) 20 MG capsule, TAKE 1 TABLET DAILY, Disp: 90 capsule, Rfl: 1 .  sildenafil (REVATIO) 20 MG tablet, Take 1 tablet (20 mg total) by mouth 3 (three) times daily., Disp: 90 tablet, Rfl: 3 .  SYNTHROID 75 MCG tablet, Take 1 tablet (75 mcg total) by mouth daily before breakfast., Disp: 90 tablet, Rfl: 1 .  HYDROcodone-acetaminophen (NORCO) 5-325 MG tablet, Take 1 tablet by mouth every 6 (six) hours as needed for up to 5 days for moderate pain., Disp: 20 tablet, Rfl: 0  Allergies  Allergen Reactions  . Codeine Shortness Of Breath    REACTION: dyspnea  . Dilaudid [Hydromorphone Hcl] Anaphylaxis  . Hydromorphone Anaphylaxis  . Acyclovir And Related   . Crestor [Rosuvastatin Calcium] Other (See Comments)    Muscle pain  . Gabapentin Swelling    Swelling in feet, and in legs  . Aleve [Naproxen Sodium] Rash  . Lyrica [Pregabalin] Other (See Comments)    Swelling in feet, and in legs    Objective:   BP (!) 151/62   Pulse 77   Temp 99.6 F (37.6 C) (Temporal)   Ht 5\' 2"  (1.575 m)  Wt 118 lb 6.4 oz (53.7 kg)   SpO2 96%   BMI 21.66 kg/m    Physical Exam Vitals reviewed.  Constitutional:      General: She is not in acute distress.    Appearance: Normal appearance. She is normal weight. She is not ill-appearing, toxic-appearing or diaphoretic.  HENT:     Head: Normocephalic and atraumatic.  Eyes:     General: No scleral icterus.       Right eye: No discharge.        Left eye: No discharge.     Conjunctiva/sclera: Conjunctivae normal.  Cardiovascular:     Rate and Rhythm: Normal rate and regular rhythm.     Heart sounds: Normal heart sounds. No murmur. No friction rub. No gallop.   Pulmonary:     Effort: Pulmonary effort is normal. No respiratory  distress.     Breath sounds: Normal breath sounds. No stridor. No wheezing, rhonchi or rales.  Musculoskeletal:        General: Normal range of motion.     Cervical back: Normal range of motion.     Comments: Pain to ribs on left side.   Skin:    General: Skin is warm and dry.     Capillary Refill: Capillary refill takes less than 2 seconds.  Neurological:     General: No focal deficit present.     Mental Status: She is alert and oriented to person, place, and time. Mental status is at baseline.  Psychiatric:        Mood and Affect: Mood normal.        Behavior: Behavior normal.        Thought Content: Thought content normal.        Judgment: Judgment normal.

## 2019-08-18 DIAGNOSIS — Z23 Encounter for immunization: Secondary | ICD-10-CM | POA: Diagnosis not present

## 2019-08-21 ENCOUNTER — Ambulatory Visit: Payer: Medicare Other | Attending: Internal Medicine

## 2019-08-30 DIAGNOSIS — H9203 Otalgia, bilateral: Secondary | ICD-10-CM | POA: Diagnosis not present

## 2019-08-30 DIAGNOSIS — H6121 Impacted cerumen, right ear: Secondary | ICD-10-CM | POA: Diagnosis not present

## 2019-08-30 DIAGNOSIS — H838X3 Other specified diseases of inner ear, bilateral: Secondary | ICD-10-CM | POA: Diagnosis not present

## 2019-08-30 DIAGNOSIS — H903 Sensorineural hearing loss, bilateral: Secondary | ICD-10-CM | POA: Diagnosis not present

## 2019-08-31 ENCOUNTER — Emergency Department (HOSPITAL_COMMUNITY)
Admission: EM | Admit: 2019-08-31 | Discharge: 2019-09-01 | Disposition: A | Discharge status: Home / Self Care | Payer: Medicare Other | Attending: Emergency Medicine | Admitting: Emergency Medicine

## 2019-08-31 ENCOUNTER — Other Ambulatory Visit: Payer: Self-pay

## 2019-08-31 ENCOUNTER — Emergency Department (HOSPITAL_COMMUNITY): Payer: Medicare Other

## 2019-08-31 ENCOUNTER — Encounter (HOSPITAL_COMMUNITY): Payer: Self-pay | Admitting: Emergency Medicine

## 2019-08-31 DIAGNOSIS — R002 Palpitations: Secondary | ICD-10-CM | POA: Diagnosis not present

## 2019-08-31 DIAGNOSIS — R079 Chest pain, unspecified: Secondary | ICD-10-CM | POA: Diagnosis not present

## 2019-08-31 DIAGNOSIS — E039 Hypothyroidism, unspecified: Secondary | ICD-10-CM | POA: Diagnosis not present

## 2019-08-31 DIAGNOSIS — D649 Anemia, unspecified: Secondary | ICD-10-CM | POA: Diagnosis not present

## 2019-08-31 DIAGNOSIS — R1013 Epigastric pain: Secondary | ICD-10-CM

## 2019-08-31 DIAGNOSIS — R0789 Other chest pain: Secondary | ICD-10-CM | POA: Insufficient documentation

## 2019-08-31 DIAGNOSIS — Z79899 Other long term (current) drug therapy: Secondary | ICD-10-CM | POA: Diagnosis not present

## 2019-08-31 DIAGNOSIS — R197 Diarrhea, unspecified: Secondary | ICD-10-CM | POA: Diagnosis not present

## 2019-08-31 LAB — CBC WITH DIFFERENTIAL/PLATELET
Abs Immature Granulocytes: 0.03 10*3/uL (ref 0.00–0.07)
Basophils Absolute: 0.1 10*3/uL (ref 0.0–0.1)
Basophils Relative: 1 %
Eosinophils Absolute: 0.1 10*3/uL (ref 0.0–0.5)
Eosinophils Relative: 1 %
HCT: 19.3 % — ABNORMAL LOW (ref 36.0–46.0)
Hemoglobin: 9.9 g/dL — ABNORMAL LOW (ref 12.0–15.0)
Immature Granulocytes: 0 %
Lymphocytes Relative: 19 %
Lymphs Abs: 1.7 10*3/uL (ref 0.7–4.0)
MCH: 46.9 pg — ABNORMAL HIGH (ref 26.0–34.0)
MCHC: 51.3 g/dL — ABNORMAL HIGH (ref 30.0–36.0)
MCV: 91.5 fL (ref 80.0–100.0)
Monocytes Absolute: 0.4 10*3/uL (ref 0.1–1.0)
Monocytes Relative: 5 %
Neutro Abs: 6.7 10*3/uL (ref 1.7–7.7)
Neutrophils Relative %: 74 %
Platelets: 362 10*3/uL (ref 150–400)
RBC: 2.11 MIL/uL — ABNORMAL LOW (ref 3.87–5.11)
RDW: 15 % (ref 11.5–15.5)
WBC: 9 10*3/uL (ref 4.0–10.5)
nRBC: 0 % (ref 0.0–0.2)

## 2019-08-31 LAB — URINALYSIS, ROUTINE W REFLEX MICROSCOPIC
Bilirubin Urine: NEGATIVE
Glucose, UA: NEGATIVE mg/dL
Hgb urine dipstick: NEGATIVE
Ketones, ur: NEGATIVE mg/dL
Leukocytes,Ua: NEGATIVE
Nitrite: NEGATIVE
Protein, ur: NEGATIVE mg/dL
Specific Gravity, Urine: 1.025 (ref 1.005–1.030)
pH: 7 (ref 5.0–8.0)

## 2019-08-31 LAB — COMPREHENSIVE METABOLIC PANEL
ALT: 10 U/L (ref 0–44)
AST: 16 U/L (ref 15–41)
Albumin: 3.9 g/dL (ref 3.5–5.0)
Alkaline Phosphatase: 50 U/L (ref 38–126)
Anion gap: 9 (ref 5–15)
BUN: 11 mg/dL (ref 8–23)
CO2: 23 mmol/L (ref 22–32)
Calcium: 8.9 mg/dL (ref 8.9–10.3)
Chloride: 108 mmol/L (ref 98–111)
Creatinine, Ser: 0.73 mg/dL (ref 0.44–1.00)
GFR calc Af Amer: >60 mL/min (ref >60)
GFR calc non Af Amer: >60 mL/min (ref >60)
Glucose, Bld: 99 mg/dL (ref 70–99)
Potassium: 4.3 mmol/L (ref 3.5–5.1)
Sodium: 140 mmol/L (ref 135–145)
Total Bilirubin: 0.5 mg/dL (ref 0.3–1.2)
Total Protein: 7.4 g/dL (ref 6.5–8.1)

## 2019-08-31 LAB — LIPASE, BLOOD: Lipase: 26 U/L (ref 11–51)

## 2019-08-31 LAB — TROPONIN I (HIGH SENSITIVITY)
Troponin I (High Sensitivity): 5 ng/L (ref <18)
Troponin I (High Sensitivity): 7 ng/L (ref <18)

## 2019-08-31 LAB — CBG MONITORING, ED: Glucose-Capillary: 94 mg/dL (ref 70–99)

## 2019-08-31 LAB — POC OCCULT BLOOD, ED: Fecal Occult Bld: NEGATIVE

## 2019-08-31 MED ORDER — ALUM & MAG HYDROXIDE-SIMETH 200-200-20 MG/5ML PO SUSP
30.0000 mL | Freq: Once | ORAL | Status: AC
  Administered 2019-08-31: 30 mL via ORAL
  Filled 2019-08-31: qty 30

## 2019-08-31 MED ORDER — IOHEXOL 300 MG/ML  SOLN
100.0000 mL | Freq: Once | INTRAMUSCULAR | Status: AC | PRN
  Administered 2019-08-31: 13:00:00 100 mL via INTRAVENOUS

## 2019-08-31 MED ORDER — ONDANSETRON 4 MG PO TBDP: 4.0000 mg | ORAL_TABLET | Freq: Three times a day (TID) | ORAL | 0 refills | Status: DC | PRN

## 2019-08-31 MED ORDER — ONDANSETRON HCL 4 MG/2ML IJ SOLN
4.0000 mg | Freq: Once | INTRAMUSCULAR | Status: AC
  Administered 2019-08-31: 4 mg via INTRAVENOUS
  Filled 2019-08-31: qty 2

## 2019-08-31 MED ORDER — FAMOTIDINE IN NACL 20-0.9 MG/50ML-% IV SOLN
20.0000 mg | Freq: Once | INTRAVENOUS | Status: AC
  Administered 2019-08-31: 20 mg via INTRAVENOUS
  Filled 2019-08-31: qty 50

## 2019-08-31 NOTE — ED Triage Notes (Signed)
Pt reports epigastric pain and burning sensation in chest x3 nights. Pt reports intermittent diarrhea but reports is not unusual at baseline. Pt reports was diagnosed with rib fracture on feb 6th and reports intermittent pain with breathing ever since injury. Had covid test x3 weeks ago with negative result.

## 2019-08-31 NOTE — Discharge Instructions (Addendum)
Your lab tests including evaluation of your abdomen and heart, along with your CT scan are stable and reassuring today with no evidence of these episodes of pain and nausea found.  I recommend you continue taking your omeprazole.  In addition, you may consider adding a dose of maalox or mylanta if you have another episode which may help your symptoms.  I have prescribed you a nausea medicine additionally.  Plan a recheck by your primary MD for a recheck within the week if your symptoms persist.   Also, you are more anemic than your baseline today, with your hemoglobin level at 9.9 (normal range 12-15). Your stool test is negative.  Please have this repeated by your primary MD.

## 2019-08-31 NOTE — ED Provider Notes (Signed)
[facility] EMERGENCY DEPARTMENT Provider Note   CSN: [id number] Arrival date & time: 08/31/19  [id number]     History Chief Complaint  Patient presents with  . Abdominal Pain    Haley Trevino is a 79 y.o. female with a history of GERD, hypothyroidism, raynauds and scleroderma presenting with chest and abdominal pain.  She describes being diagnosed with a left rib fracture by her pcp on 2/6 after direct pressure by her car console when she leaned over too far.  Since this event she has had 3 distinct episodes of intermittent nausea in association with epigastric pain radiating into her chest, described as burning and sometimes sharp pain. She was initially prescribed hydrocodone for her injury only taking 1-2 tablets when she recognized it was making her nauseated.  Switched to ibuprofen but has not taken in over a week also due to nausea and pain symptoms.  She denies sob, has no radiation of pain, no back pain.  She had a severe episode last night also in association of heart palpitations.  She denies abdominal distention, no changes in her bowel habits (chronic constipation).  She denies dizziness, diaphoresis, denies any sx of fever although states her temperature ytd was 99.6.  No cough.  She has found no alleviators for her sx.  She takes omeprazole for chronic GERD, no missed doses except for this am.   The history is provided by the patient.       Past Medical History:  Diagnosis Date  . Cataract   . Dyspnea   . Osteoarthritis   . Raynaud disease   . Scleroderma ([place])   . Thyroid disease    hypothyroidism    Patient Active Problem List   Diagnosis Date Noted  . Murmur 11/18/2017  . Impingement syndrome of left shoulder region 12/08/2014  . Osteoporosis 09/20/2014  . Hip fracture requiring operative repair ([place]) 02/22/2014  . Hypothyroidism 11/29/2013  . Depression 11/29/2013  . GERD (gastroesophageal reflux disease) 11/29/2013  . Hypertension 11/29/2013  .  Hyperlipidemia with target LDL less than 100 11/29/2013  . Thyroid cyst 04/26/2013  . MALIGNANT MELANOMA, SKIN 08/07/2010  . [name] 08/06/2010  . SCLERODERMA 08/06/2010  . Osteoarthritis 08/06/2010    Past Surgical History:  Procedure Laterality Date  . BREAST ENHANCEMENT SURGERY  1975  . BREAST IMPLANT REMOVAL Bilateral 04/23/2016   Procedure: REMOVALBILATERAL BREAST IMPLANTS;  Surgeon: Wallace Going, DO;  Location: [facility];  Service: Plastics;  Laterality: Bilateral;  . CHOLECYSTECTOMY  1973  . HAND SURGERY  08/2012; 11/2012  . IR RADIOLOGIST EVAL & MGMT  07/27/2017  . IR SACROPLASTY BILATERAL  07/31/2017  . MELANOMA EXCISION     at 76 yrs of age  . ORIF HIP FRACTURE Right 02/22/2014   Procedure: OPEN REDUCTION INTERNAL FIXATION RIGHT HIP;  Surgeon: Sanjuana Kava, MD;  Location: [facility];  Service: Orthopedics;  Laterality: Right;  . TOTAL ABDOMINAL HYSTERECTOMY  1974     OB History    Gravida  3   Para  2   Term      Preterm      AB  1   Living  1     SAB  1   TAB      Ectopic      Multiple      Live Births              Family History  Problem Relation Age of Onset  . Pancreatic cancer Father   .  Raynaud syndrome Father   . Cancer Father        pancreatic  . Rashes / Skin problems Daughter        possibly scleraderma  . Hip fracture Mother   . Mental illness Mother        attempted suicide at 58 yo    Social History   Tobacco Use  . Smoking status: Never Smoker  . Smokeless tobacco: Never Used  . Tobacco comment: passive tobacco smoke exposure as child  Substance Use Topics  . Alcohol use: No  . Drug use: No    Home Medications Prior to Admission medications   Medication Sig Start Date End Date Taking? Authorizing Provider  Cholecalciferol (VITAMIN D3) 2000 units TABS Take 2,000 Units by mouth daily.   Yes [provider]  FLUoxetine (PROZAC) 40 MG capsule Take 1 capsule (40 mg total) by mouth  daily. 07/22/19  Yes Hassell Done, Mary-Margaret, FNP  meclizine (ANTIVERT) 25 MG tablet Take 1 tablet (25 mg total) by mouth 3 (three) times daily as needed for dizziness. 01/21/19  Yes Hawks, Christy A, FNP  NIFEdipine (PROCARDIA XL/NIFEDICAL XL) 60 MG 24 hr tablet Take 1 tablet (60 mg total) by mouth daily. 07/22/19  Yes Hassell Done, Mary-Margaret, FNP  omeprazole (PRILOSEC) 20 MG capsule TAKE 1 TABLET DAILY 07/22/19  Yes Hassell Done, Mary-Margaret, FNP  sildenafil (REVATIO) 20 MG tablet Take 1 tablet (20 mg total) by mouth 3 (three) times daily. 07/22/19  Yes Hassell Done, Mary-Margaret, FNP  SYNTHROID 75 MCG tablet Take 1 tablet (75 mcg total) by mouth daily before breakfast. 07/22/19  Yes Hassell Done, Mary-Margaret, FNP  clonazePAM (KLONOPIN) 0.5 MG tablet Take 1 tablet (0.5 mg total) by mouth 2 (two) times daily. Patient not taking: Reported on 08/31/2019 07/22/19   [name], FNP  ondansetron (ZOFRAN ODT) 4 MG disintegrating tablet Take 1 tablet (4 mg total) by mouth every 8 (eight) hours as needed for nausea or vomiting. 08/31/19   Evalee Jefferson, PA-C    Allergies    Codeine, Dilaudid [hydromorphone hcl], Hydromorphone, Acyclovir and related, Crestor [rosuvastatin calcium], Gabapentin, Aleve [naproxen sodium], and Lyrica [pregabalin]  Review of Systems   Review of Systems  Constitutional: Negative for diaphoresis, fatigue and fever.  HENT: Negative.   Eyes: Negative.   Respiratory: Negative for chest tightness and shortness of breath.   Cardiovascular: Positive for chest pain and palpitations. Negative for leg swelling.  Gastrointestinal: Positive for abdominal pain, nausea and vomiting.  Genitourinary: Negative.  Negative for dysuria.  Musculoskeletal: Negative for arthralgias, joint swelling and neck pain.  Skin: Negative.  Negative for rash and wound.  Neurological: Negative for dizziness, weakness, light-headedness, numbness and headaches.  Psychiatric/Behavioral: Negative.     Physical Exam Updated  Vital Signs BP (!) 156/60 (BP Location: Right Arm)   Pulse 66   Temp 98.7 F (37.1 C) (Oral)   Resp 16   Ht 5\' 2"  (1.575 m)   Wt 53.1 kg   SpO2 96%   BMI 21.40 kg/m   Physical Exam Vitals and nursing note reviewed.  Constitutional:      Appearance: She is well-developed.  HENT:     Head: Normocephalic and atraumatic.  Eyes:     Conjunctiva/sclera: Conjunctivae normal.  Cardiovascular:     Rate and Rhythm: Normal rate and regular rhythm.     Heart sounds: Murmur present.  Pulmonary:     Effort: Pulmonary effort is normal.     Breath sounds: Normal breath sounds. No wheezing or rales.  Comments: ttp left lower ribs.  Chest:     Chest wall: Tenderness present.  Abdominal:     General: Bowel sounds are normal. There is no distension.     Palpations: Abdomen is soft.     Tenderness: There is abdominal tenderness in the epigastric area. There is no guarding or rebound.  Musculoskeletal:        General: Normal range of motion.     Cervical back: Normal range of motion.  Skin:    General: Skin is warm and dry.  Neurological:     General: No focal deficit present.     Mental Status: She is alert and oriented to person, place, and time.     ED Results / Procedures / Treatments   Labs (all labs ordered are listed, but only abnormal results are displayed) Labs Reviewed  CBC WITH DIFFERENTIAL/PLATELET - Abnormal; Notable for the following components:      Result Value   RBC 2.11 (*)    Hemoglobin 9.9 (*)    HCT 19.3 (*)    MCH 46.9 (*)    MCHC 51.3 (*)    All other components within normal limits  COMPREHENSIVE METABOLIC PANEL  LIPASE, BLOOD  URINALYSIS, ROUTINE W REFLEX MICROSCOPIC  CBG MONITORING, ED  POC OCCULT BLOOD, ED  TROPONIN I (HIGH SENSITIVITY)  TROPONIN I (HIGH SENSITIVITY)    EKG EKG Interpretation  Date/Time:  Wednesday August 31 2019 09:47:18 EST Ventricular Rate:  63 PR Interval:    QRS Duration: 79 QT Interval:  437 QTC  Calculation: 448 R Axis:   71 Text Interpretation: Sinus rhythm Nonspecific ST abnormality No significant change since last tracing Confirmed by Lajean Saver [phone]) on 08/31/2019 9:52:03 AM   Radiology CT ABDOMEN PELVIS W CONTRAST  Result Date: 08/31/2019 CLINICAL DATA:  Abdominal pain. Epigastric pain and burning. Intermittent diarrhea. EXAM: CT ABDOMEN AND PELVIS WITH CONTRAST TECHNIQUE: Multidetector CT imaging of the abdomen and pelvis was performed using the standard protocol following bolus administration of intravenous contrast. CONTRAST:  131mL OMNIPAQUE IOHEXOL 300 MG/ML  SOLN COMPARISON:  06/10/2018. FINDINGS: Lower chest: Unremarkable Hepatobiliary: No suspicious focal abnormality within the liver parenchyma. Gallbladder surgically absent. Mild intra and extrahepatic biliary duct prominence is similar to prior. Extrahepatic common duct measures 8 mm in diameter, up from 6 mm previously. Pancreas: No focal mass lesion. No dilatation of the main duct. No intraparenchymal cyst. No peripancreatic edema. Spleen: No splenomegaly. No focal mass lesion. Adrenals/Urinary Tract: No adrenal nodule or mass. Kidneys unremarkable. No evidence for hydroureter. The urinary bladder appears normal for the degree of distention. Stomach/Bowel: Stomach is unremarkable. No gastric wall thickening. No evidence of outlet obstruction. Duodenum is normally positioned as is the ligament of Treitz. No small bowel wall thickening. No small bowel dilatation. The terminal ileum is normal. The appendix is not visualized, but there is no edema or inflammation in the region of the cecum. No gross colonic mass. No colonic wall thickening. Vascular/Lymphatic: There is abdominal aortic atherosclerosis without aneurysm. There is no gastrohepatic or hepatoduodenal ligament lymphadenopathy. No retroperitoneal or mesenteric lymphadenopathy. Duplicated IVC noted. No pelvic sidewall lymphadenopathy. Reproductive: The uterus is surgically  absent. 2.2 cm benign appearing cystic lesion right adnexal space stable since prior study and evaluated by ultrasound on 06/21/2018. Other: No intraperitoneal free fluid. Musculoskeletal: Status post ORIF for proximal right femur fracture. Status post sacroplasty. Bones are diffusely demineralized. Degenerative disc disease noted lumbar spine. IMPRESSION: 1. No acute findings in the abdomen or pelvis. No  findings to explain the patient's history of pain and intermittent diarrhea. 2. Mild intra and extrahepatic biliary duct dilatation, similar to prior but common bile duct diameter measures minimally larger today. This may all be related to prior cholecystectomy but correlation with liver function testing recommended. 3. Stable appearance benign appearing cystic lesion right adnexal space suggesting benign etiology. This was evaluated by ultrasound exam on 06/21/2018. 4.  Aortic Atherosclerois (ICD10-170.0) Electronically Signed   By: Misty Stanley M.D.   On: 08/31/2019 14:02   DG Chest Portable 1 View  Result Date: 08/31/2019 CLINICAL DATA:  Chest pain. [place] 08/13/2019. Known left rib fracture. EXAM: PORTABLE CHEST 1 VIEW COMPARISON:  08/16/2019 FINDINGS: The heart is upper limits of normal in size and stable given the AP projection and portable technique. There is mild tortuosity and calcification of the thoracic aorta. The pulmonary hila appear normal. The lungs are clear of an acute process. No infiltrates or effusions. No pneumothorax. The left lower anterior rib fracture seen on the prior study is not well demonstrated on this examination. IMPRESSION: No acute cardiopulmonary findings. The left lower anterior rib fracture seen on the prior study is not well demonstrated on this examination. Electronically Signed   By: Marijo Sanes M.D.   On: 08/31/2019 10:51    Procedures Procedures (including critical care time)  Medications Ordered in ED Medications  famotidine (PEPCID) IVPB 20 mg premix (0 mg  Intravenous Stopped 08/31/19 1122)  iohexol (OMNIPAQUE) 300 MG/ML solution 100 mL (100 mLs Intravenous Contrast Given 08/31/19 1258)  ondansetron (ZOFRAN) injection 4 mg (4 mg Intravenous Given 08/31/19 1426)  alum & mag hydroxide-simeth (MAALOX/MYLANTA) 200-200-20 MG/5ML suspension 30 mL (30 mLs Oral Given 08/31/19 1534)    ED Course  I have reviewed the triage vital signs and the nursing notes.  Pertinent labs & imaging results that were available during my care of the patient were reviewed by me and considered in my medical decision making (see chart for details).    MDM Rules/Calculators/A&P                      Pt with intermittent nausea and epigastric/chest pain since left rib fracture several weeks ago.  Labs and imaging negative for acute intra abdominal process, cxr negative for pneumonia.  She does have increased anemia with hemoglobin 9.9. pt states has not taken her iron supplement in the past several weeks suspicious this may be increasing nausea.  Hemoccult negative.  Delta troponins negative, doubt ACS as atypical presentation.  CT suggests enlarged CBD from 9mm to 8 mm, but negative LFT's and lipase, unlikely CBD stone.  H/o cholecystectomy.  Prescribed zofran for nausea. Advised recheck by pcp within one week.  Final Clinical Impression(s) / ED Diagnoses Final diagnoses:  Epigastric pain  Anemia, unspecified type    Rx / DC Orders ED Discharge Orders         Ordered    ondansetron (ZOFRAN ODT) 4 MG disintegrating tablet  Every 8 hours PRN     08/31/19 1459           Evalee Jefferson, PA-C 09/01/19 0740    Lajean Saver, MD 09/01/19 1501

## 2019-09-07 ENCOUNTER — Other Ambulatory Visit: Payer: Self-pay

## 2019-09-08 ENCOUNTER — Ambulatory Visit (INDEPENDENT_AMBULATORY_CARE_PROVIDER_SITE_OTHER): Payer: Medicare Other | Admitting: Nurse Practitioner

## 2019-09-08 ENCOUNTER — Encounter: Payer: Self-pay | Admitting: Nurse Practitioner

## 2019-09-08 VITALS — BP 140/62 | HR 77 | Temp 97.2°F | Resp 20 | Ht 62.0 in | Wt 115.0 lb

## 2019-09-08 DIAGNOSIS — R5383 Other fatigue: Secondary | ICD-10-CM | POA: Diagnosis not present

## 2019-09-08 DIAGNOSIS — D508 Other iron deficiency anemias: Secondary | ICD-10-CM

## 2019-09-08 DIAGNOSIS — Z09 Encounter for follow-up examination after completed treatment for conditions other than malignant neoplasm: Secondary | ICD-10-CM

## 2019-09-08 DIAGNOSIS — R6889 Other general symptoms and signs: Secondary | ICD-10-CM | POA: Diagnosis not present

## 2019-09-08 NOTE — Progress Notes (Signed)
Subjective:    Patient ID: Haley Trevino, female    DOB: 08-13-1940, 79 y.o.   MRN: [id number]   Chief Complaint: Hospitalization Follow-up   HPI Pt presented to ER on 2/24 for epigastric and chest pain that started after left rib fracture on 2/6.Felt weak and was having frequent vomiting and diarrhea. Was given zofran for nausea and is here for follow-up today. Hgb/Hct levels were low and pt stated she was not taking Fe supplements due to nausea. Pt reports she is now taking Fe supplement bid. Denies epigastric pain now but is still having some chest pain on left side from rib fracture. Denies vomiting and diarrhea now but does still have bouts of nausea. Is not taking zofran because she does not want it to interact with prozac. Appetite and fluid intake are decreased. Reports extreme fatigue.   Review of Systems  Constitutional: Positive for appetite change (decreased appetite/nausea) and fatigue.  HENT: Negative.   Eyes: Negative.   Respiratory: Positive for shortness of breath (with increased activity, L rib fracture).   Cardiovascular: Negative.   Gastrointestinal: Positive for nausea.  Endocrine: Negative.   Genitourinary: Negative.   Musculoskeletal:       Shoulder pain-needs replacement  Skin: Negative.   Allergic/Immunologic: Negative.   Neurological: Positive for dizziness.  Hematological: Bruises/bleeds easily.  Psychiatric/Behavioral: Negative.        Objective:   Physical Exam Vitals and nursing note reviewed.  Constitutional:      Appearance: Normal appearance. She is normal weight.  HENT:     Head: Normocephalic.     Right Ear: Tympanic membrane, ear canal and external ear normal.     Left Ear: Tympanic membrane, ear canal and external ear normal.     Nose: Nose normal.     Mouth/Throat:     Mouth: Mucous membranes are moist.     Pharynx: Oropharynx is clear.  Eyes:     Conjunctiva/sclera: Conjunctivae normal.     Pupils: Pupils are equal, round,  and reactive to light.  Cardiovascular:     Rate and Rhythm: Normal rate and regular rhythm.     Pulses: Normal pulses.     Heart sounds: Murmur (2+) present.  Pulmonary:     Effort: Pulmonary effort is normal.     Breath sounds: Normal breath sounds.  Abdominal:     General: Abdomen is flat. Bowel sounds are normal.     Palpations: Abdomen is soft.  Musculoskeletal:        General: Normal range of motion.     Cervical back: Normal range of motion and neck supple.  Skin:    General: Skin is warm and dry.     Capillary Refill: Capillary refill takes less than 2 seconds.     Findings: Bruising present.  Neurological:     General: No focal deficit present.     Mental Status: She is alert and oriented to person, place, and time.  Psychiatric:        Mood and Affect: Mood normal.        Behavior: Behavior normal.      BP 140/62   Pulse 77   Temp (!) 97.2 F (36.2 C) (Temporal)   Resp 20   Ht 5\' 2"  (1.575 m)   Wt 115 lb (52.2 kg)   SpO2 97%   BMI 21.03 kg/m       Assessment & Plan:  [name] in today with chief complaint of Hospitalization Follow-up  1. Other iron deficiency anemia Continue daily iron supplement - Anemia Profile B  2. Fatigue, unspecified type Rest Will call with lab results - Thyroid Panel With TSH  3. Hospital discharge follow up Hospital records reviewed  The above assessment and management plan was discussed with the patient. The patient verbalized understanding of and has agreed to the management plan. Patient is aware to call the clinic if symptoms persist or worsen. Patient is aware when to return to the clinic for a follow-up visit. Patient educated on when it is appropriate to go to the emergency department.   Mary-Margaret Hassell Done, FNP

## 2019-09-08 NOTE — Patient Instructions (Signed)
Anemia  Anemia is a condition in which you do not have enough red blood cells or hemoglobin. Hemoglobin is a substance in red blood cells that carries oxygen. When you do not have enough red blood cells or hemoglobin (are anemic), your body cannot get enough oxygen and your organs may not work properly. As a result, you may feel very tired or have other problems. What are the causes? Common causes of anemia include:  Excessive bleeding. Anemia can be caused by excessive bleeding inside or outside the body, including bleeding from the intestine or from periods in women.  Poor nutrition.  Long-lasting (chronic) kidney, thyroid, and liver disease.  Bone marrow disorders.  Cancer and treatments for cancer.  HIV (human immunodeficiency virus) and AIDS (acquired immunodeficiency syndrome).  Treatments for HIV and AIDS.  Spleen problems.  Blood disorders.  Infections, medicines, and autoimmune disorders that destroy red blood cells. What are the signs or symptoms? Symptoms of this condition include:  Minor weakness.  Dizziness.  Headache.  Feeling heartbeats that are irregular or faster than normal (palpitations).  Shortness of breath, especially with exercise.  Paleness.  Cold sensitivity.  Indigestion.  Nausea.  Difficulty sleeping.  Difficulty concentrating. Symptoms may occur suddenly or develop slowly. If your anemia is mild, you may not have symptoms. How is this diagnosed? This condition is diagnosed based on:  Blood tests.  Your medical history.  A physical exam.  Bone marrow biopsy. Your health care provider may also check your stool (feces) for blood and may do additional testing to look for the cause of your bleeding. You may also have other tests, including:  Imaging tests, such as a CT scan or MRI.  Endoscopy.  Colonoscopy. How is this treated? Treatment for this condition depends on the cause. If you continue to lose a lot of blood, you may  need to be treated at a hospital. Treatment may include:  Taking supplements of iron, vitamin S31, or folic acid.  Taking a hormone medicine (erythropoietin) that can help to stimulate red blood cell growth.  Having a blood transfusion. This may be needed if you lose a lot of blood.  Making changes to your diet.  Having surgery to remove your spleen. Follow these instructions at home:  Take over-the-counter and prescription medicines only as told by your health care provider.  Take supplements only as told by your health care provider.  Follow any diet instructions that you were given.  Keep all follow-up visits as told by your health care provider. This is important. Contact a health care provider if:  You develop new bleeding anywhere in the body. Get help right away if:  You are very weak.  You are short of breath.  You have pain in your abdomen or chest.  You are dizzy or feel faint.  You have trouble concentrating.  You have bloody or black, tarry stools.  You vomit repeatedly or you vomit up blood. Summary  Anemia is a condition in which you do not have enough red blood cells or enough of a substance in your red blood cells that carries oxygen (hemoglobin).  Symptoms may occur suddenly or develop slowly.  If your anemia is mild, you may not have symptoms.  This condition is diagnosed with blood tests as well as a medical history and physical exam. Other tests may be needed.  Treatment for this condition depends on the cause of the anemia. This information is not intended to replace advice given to you by  your health care provider. Make sure you discuss any questions you have with your health care provider. Document Revised: 06/05/2017 Document Reviewed: 07/25/2016 Elsevier Patient Education  [place].

## 2019-09-09 LAB — ANEMIA PROFILE B
Basophils Absolute: 0.1 10*3/uL (ref 0.0–0.2)
Basos: 1 %
EOS (ABSOLUTE): 0.2 10*3/uL (ref 0.0–0.4)
Eos: 2 %
Ferritin: 9 ng/mL — ABNORMAL LOW (ref 15–150)
Folate: 6.9 ng/mL (ref >3.0)
Hematocrit: 32.7 % — ABNORMAL LOW (ref 34.0–46.6)
Hemoglobin: 10.3 g/dL — ABNORMAL LOW (ref 11.1–15.9)
Immature Grans (Abs): 0 10*3/uL (ref 0.0–0.1)
Immature Granulocytes: 0 %
Iron Saturation: 25 % (ref 15–55)
Iron: 89 ug/dL (ref 27–139)
Lymphocytes Absolute: 2.3 10*3/uL (ref 0.7–3.1)
Lymphs: 22 %
MCH: 27.8 pg (ref 26.6–33.0)
MCHC: 31.5 g/dL (ref 31.5–35.7)
MCV: 88 fL (ref 79–97)
Monocytes Absolute: 0.8 10*3/uL (ref 0.1–0.9)
Monocytes: 7 %
Neutrophils Absolute: 6.9 10*3/uL (ref 1.4–7.0)
Neutrophils: 68 %
Platelets: 407 10*3/uL (ref 150–450)
RBC: 3.71 x10E6/uL — ABNORMAL LOW (ref 3.77–5.28)
RDW: 13.1 % (ref 11.7–15.4)
Retic Ct Pct: 1 % (ref 0.6–2.6)
Total Iron Binding Capacity: 360 ug/dL (ref 250–450)
UIBC: 271 ug/dL (ref 118–369)
Vitamin B-12: 606 pg/mL (ref 232–1245)
WBC: 10.2 10*3/uL (ref 3.4–10.8)

## 2019-09-09 LAB — THYROID PANEL WITH TSH
Free Thyroxine Index: 2.5 (ref 1.2–4.9)
T3 Uptake Ratio: 27 % (ref 24–39)
T4, Total: 9.4 ug/dL (ref 4.5–12.0)
TSH: 0.887 u[IU]/mL (ref 0.450–4.500)

## 2019-10-04 ENCOUNTER — Telehealth: Payer: Self-pay | Admitting: Nurse Practitioner

## 2019-10-04 NOTE — Chronic Care Management (AMB) (Signed)
  Chronic Care Management   Outreach Note  10/04/2019 Name: LING LAWRENZ MRN: [id number] DOB: Mar 29, 1941  THERA ROSEKRANS is a 79 y.o. year old female who is a primary care patient of [name], [place]. I reached out to [name] by phone today in response to a referral sent by Ms. Rushie Goltz [employer]'s health plan.     An unsuccessful telephone outreach was attempted today. The patient was referred to the case management team for assistance with care management and care coordination.   Follow Up Plan: A HIPPA compliant phone message was left for the patient providing contact information and requesting a return call.  The care management team will reach out to the patient again over the next 7 days.  If patient returns call to provider office, please advise to call [facility]  at [place], [place], [place], [place] [address] Direct Dial: [phone] [email or web address] Website: [email or web address]

## 2019-10-13 NOTE — Chronic Care Management (AMB) (Signed)
  Chronic Care Management   Outreach Note  10/13/2019 Name: Haley Trevino MRN: [id number] DOB: 06-18-1941  Haley Trevino is a 79 y.o. year old female who is a primary care patient of [name], [place]. I reached out to [name] by phone today in response to a referral sent by [name]'s health plan.     A second unsuccessful telephone outreach was attempted today. The patient was referred to the case management team for assistance with care management and care coordination.   Follow Up Plan: A HIPPA compliant phone message was left for the patient providing contact information and requesting a return call.  The care management team will reach out to the patient again over the next 7 days.  If patient returns call to provider office, please advise to call [facility]  at [place], [place], [place], [place] [address] Direct Dial: [phone] [email or web address]: [email or web address]

## 2019-10-17 NOTE — Chronic Care Management (AMB) (Signed)
  Chronic Care Management   Note  10/17/2019 Name: Haley Trevino MRN: [id number] DOB: [birth year]  Haley Trevino is a 79 y.o. year old female who is a primary care patient of [name], [place]. I reached out to [name] by phone today in response to a referral sent by [name]'s health plan.     [name] was given information about Chronic Care Management services today including:  1. CCM service includes personalized support from designated clinical staff supervised by her physician, including individualized plan of care and coordination with other care providers 2. 24/7 contact phone numbers for assistance for urgent and routine care needs. 3. Service will only be billed when office clinical staff spend 20 minutes or more in a month to coordinate care. 4. Only one practitioner may furnish and bill the service in a calendar month. 5. The patient may stop CCM services at any time (effective at the end of the month) by phone call to the office staff. 6. The patient will be responsible for cost sharing (co-pay) of up to 20% of the service fee (after annual deductible is met).  Patient agreed to services and verbal consent obtained.   Follow up plan: Telephone appointment with care management team member scheduled for:10/24/2019  Noreene Larsson, [place], [place], [place] [address] Direct Dial: [phone] [email or web address] Website: [email or web address]

## 2019-10-20 ENCOUNTER — Ambulatory Visit (INDEPENDENT_AMBULATORY_CARE_PROVIDER_SITE_OTHER): Payer: Medicare Other | Admitting: Nurse Practitioner

## 2019-10-20 ENCOUNTER — Other Ambulatory Visit: Payer: Self-pay

## 2019-10-20 ENCOUNTER — Encounter: Payer: Self-pay | Admitting: Nurse Practitioner

## 2019-10-20 VITALS — BP 136/63 | HR 66 | Temp 99.0°F | Resp 20 | Ht 62.0 in | Wt 115.0 lb

## 2019-10-20 DIAGNOSIS — M349 Systemic sclerosis, unspecified: Secondary | ICD-10-CM | POA: Diagnosis not present

## 2019-10-20 DIAGNOSIS — F3342 Major depressive disorder, recurrent, in full remission: Secondary | ICD-10-CM

## 2019-10-20 DIAGNOSIS — M810 Age-related osteoporosis without current pathological fracture: Secondary | ICD-10-CM

## 2019-10-20 DIAGNOSIS — I10 Essential (primary) hypertension: Secondary | ICD-10-CM | POA: Diagnosis not present

## 2019-10-20 DIAGNOSIS — K219 Gastro-esophageal reflux disease without esophagitis: Secondary | ICD-10-CM

## 2019-10-20 DIAGNOSIS — I7300 Raynaud's syndrome without gangrene: Secondary | ICD-10-CM

## 2019-10-20 DIAGNOSIS — E039 Hypothyroidism, unspecified: Secondary | ICD-10-CM

## 2019-10-20 DIAGNOSIS — E785 Hyperlipidemia, unspecified: Secondary | ICD-10-CM

## 2019-10-20 DIAGNOSIS — I7301 Raynaud's syndrome with gangrene: Secondary | ICD-10-CM | POA: Diagnosis not present

## 2019-10-20 MED ORDER — OMEPRAZOLE 20 MG PO CPDR: DELAYED_RELEASE_CAPSULE | ORAL | 1 refills | Status: DC

## 2019-10-20 MED ORDER — FERROUS SULFATE 325 (65 FE) MG PO TABS: 325.0000 mg | ORAL_TABLET | Freq: Two times a day (BID) | ORAL | 1 refills | Status: DC

## 2019-10-20 MED ORDER — SYNTHROID 75 MCG PO TABS: 75.0000 ug | ORAL_TABLET | Freq: Every day | ORAL | 1 refills | Status: DC

## 2019-10-20 MED ORDER — SILDENAFIL CITRATE 20 MG PO TABS: 20.0000 mg | ORAL_TABLET | Freq: Three times a day (TID) | ORAL | 1 refills | Status: DC

## 2019-10-20 MED ORDER — NIFEDIPINE ER OSMOTIC RELEASE 60 MG PO TB24: 60.0000 mg | ORAL_TABLET | Freq: Every day | ORAL | 1 refills | Status: DC

## 2019-10-20 NOTE — Progress Notes (Signed)
 Subjective:    Patient ID: Haley Trevino, female    DOB: 04/21/1941, 79 y.o.   MRN: [id number]   Chief Complaint: Medical Management of Chronic Issues    HPI:  1. Essential hypertension No c/o chest pain, sob or headache. Does not check bloodpressure at home. BP Readings from Last 3 Encounters:  09/08/19 140/62  08/31/19 (!) 156/60  08/16/19 (!) 151/62     2. Hyperlipidemia with target LDL less than 100 Tries to watch diet. Has a very poor appetite. Lab Results  Component Value Date   CHOL 201 (H) 07/22/2019   HDL 49 07/22/2019   LDLCALC 126 (H) 07/22/2019   TRIG 147 07/22/2019   CHOLHDL 4.1 07/22/2019     3. Acquired hypothyroidism No problems that she is aware of. Lab Results  Component Value Date   TSH 0.887 09/08/2019     4. Gastroesophageal reflux disease without esophagitis Is on omeprazole daily, works well to keep symptoms under control.  5. Recurrent major depressive disorder, in full remission (HCC) Is currently on no prescription meds. She has some mild depression due to her husbands health and her health. Depression screen PHQ 2/9 10/20/2019 08/16/2019 07/22/2019  Decreased Interest 0 0 0  Down, Depressed, Hopeless 0 0 0  PHQ - 2 Score 0 0 0  Altered sleeping - - -  Tired, decreased energy - - -  Change in appetite - - -  Feeling bad or failure about yourself  - - -  Trouble concentrating - - -  Moving slowly or fidgety/restless - - -  Suicidal thoughts - - -  PHQ-9 Score - - -  Difficult doing work/chores - - -  Some recent data might be hidden     6. [name] without gangrene Has had for years. Is currently on sildenafil and nifedipine. When weather warms up she does much better.   7. Age-related osteoporosis without current pathological fracture Has not had dexascan recently  8. SCLERODERMA See Dr. Beckman evry 6 months    Outpatient Encounter Medications as of 10/20/2019  Medication Sig  . Cholecalciferol (VITAMIN  D3) 2000 units TABS Take 2,000 Units by mouth daily.  . ferrous sulfate 325 (65 FE) MG tablet Take 325 mg by mouth daily with breakfast.  . FLUoxetine (PROZAC) 40 MG capsule Take 1 capsule (40 mg total) by mouth daily. (Patient not taking: Reported on 09/08/2019)  . meclizine (ANTIVERT) 25 MG tablet Take 1 tablet (25 mg total) by mouth 3 (three) times daily as needed for dizziness. (Patient not taking: Reported on 09/08/2019)  . NIFEdipine (PROCARDIA XL/NIFEDICAL XL) 60 MG 24 hr tablet Take 1 tablet (60 mg total) by mouth daily.  . omeprazole (PRILOSEC) 20 MG capsule TAKE 1 TABLET DAILY  . ondansetron (ZOFRAN ODT) 4 MG disintegrating tablet Take 1 tablet (4 mg total) by mouth every 8 (eight) hours as needed for nausea or vomiting. (Patient not taking: Reported on 09/08/2019)  . sildenafil (REVATIO) 20 MG tablet Take 1 tablet (20 mg total) by mouth 3 (three) times daily.  . SYNTHROID 75 MCG tablet Take 1 tablet (75 mcg total) by mouth daily before breakfast.     Past Surgical History:  Procedure Laterality Date  . BREAST ENHANCEMENT SURGERY  1975  . BREAST IMPLANT REMOVAL Bilateral 04/23/2016   Procedure: REMOVALBILATERAL BREAST IMPLANTS;  Surgeon: Claire S Dillingham, DO;  Location: [facility];  Service: Plastics;  Laterality: Bilateral;  . CHOLECYSTECTOMY  1973  . HAND SURGERY    08/2012; 11/2012  . IR RADIOLOGIST EVAL & MGMT  07/27/2017  . IR SACROPLASTY BILATERAL  07/31/2017  . MELANOMA EXCISION     at 31 yrs of age  . ORIF HIP FRACTURE Right 02/22/2014   Procedure: OPEN REDUCTION INTERNAL FIXATION RIGHT HIP;  Surgeon: Wayne Keeling, MD;  Location: [facility];  Service: Orthopedics;  Laterality: Right;  . TOTAL ABDOMINAL HYSTERECTOMY  1974    Family History  Problem Relation Age of Onset  . Pancreatic cancer Father   . Raynaud syndrome Father   . Cancer Father        pancreatic  . Rashes / Skin problems Daughter        possibly scleraderma  . Hip fracture Mother   . Mental  illness Mother        attempted suicide at 56 yo    New complaints: None today  Social history: Lives with husband  Controlled substance contract: N/A    Review of Systems  Constitutional: Negative for diaphoresis.  Eyes: Negative for pain.  Respiratory: Negative for shortness of breath.   Cardiovascular: Negative for chest pain, palpitations and leg swelling.  Gastrointestinal: Negative for abdominal pain.  Endocrine: Negative for polydipsia.  Skin: Negative for rash.  Neurological: Negative for dizziness, weakness and headaches.  Hematological: Does not bruise/bleed easily.  All other systems reviewed and are negative.      Objective:   Physical Exam Vitals and nursing note reviewed.  Constitutional:      General: She is not in acute distress.    Appearance: Normal appearance. She is well-developed.  HENT:     Head: Normocephalic.     Nose: Nose normal.  Eyes:     Pupils: Pupils are equal, round, and reactive to light.  Neck:     Vascular: No carotid bruit or JVD.  Cardiovascular:     Rate and Rhythm: Normal rate and regular rhythm.     Heart sounds: Normal heart sounds.  Pulmonary:     Effort: Pulmonary effort is normal. No respiratory distress.     Breath sounds: Normal breath sounds. No wheezing or rales.  Chest:     Chest wall: No tenderness.  Abdominal:     General: Bowel sounds are normal. There is no distension or abdominal bruit.     Palpations: Abdomen is soft. There is no hepatomegaly, splenomegaly, mass or pulsatile mass.     Tenderness: There is no abdominal tenderness.  Musculoskeletal:        General: Normal range of motion.     Cervical back: Normal range of motion and neck supple.  Lymphadenopathy:     Cervical: No cervical adenopathy.  Skin:    General: Skin is warm and dry.     Comments: Small sore on tip of right middle finger  Neurological:     Mental Status: She is alert and oriented to person, place, and time.     Deep Tendon  Reflexes: Reflexes are normal and symmetric.  Psychiatric:        Behavior: Behavior normal.        Thought Content: Thought content normal.        Judgment: Judgment normal.     BP 136/63   Pulse 66   Temp 99 F (37.2 C) (Temporal)   Resp 20   Ht 5' 2" (1.575 m)   Wt 115 lb (52.2 kg)   SpO2 98%   BMI 21.03 kg/m        Assessment & Plan:    Haley Trevino comes in today with chief complaint of Medical Management of Chronic Issues   Diagnosis and orders addressed:  1. Essential hypertension Low sodium diet - CBC with Differential/Platelet - CMP14+EGFR  2. Hyperlipidemia with target LDL less than 100 Low fat diet - Lipid panel  3. Acquired hypothyroidism Labs pending - Thyroid Panel With TSH - SYNTHROID 75 MCG tablet; Take 1 tablet (75 mcg total) by mouth daily before breakfast.  Dispense: 90 tablet; Refill: 1  4. Gastroesophageal reflux disease without esophagitis Avoid spicy foods Do not eat 2 hours prior to bedtime - omeprazole (PRILOSEC) 20 MG capsule; TAKE 1 TABLET DAILY  Dispense: 90 capsule; Refill: 1  5. Recurrent major depressive disorder, in full remission [facility]) Stress management  6. [name] without gangrene continue sildenafil and nifedipine  7. Age-related osteoporosis without current pathological fracture Weight bearing exercises Will do dexascan at next visit  8. SCLERODERMA   9. [name] with gangrene ([place]) Keep hands warm - sildenafil (REVATIO) 20 MG tablet; Take 1 tablet (20 mg total) by mouth 3 (three) times daily.  Dispense: 90 tablet; Refill: 1   Labs pending Health Maintenance reviewed Diet and exercise encouraged  Follow up plan: 3 months   Mary-Margaret Hassell Done, FNP

## 2019-10-22 LAB — CMP14+EGFR
ALT: 8 IU/L (ref 0–32)
AST: 15 IU/L (ref 0–40)
Albumin/Globulin Ratio: 1.4 (ref 1.2–2.2)
Albumin: 4 g/dL (ref 3.7–4.7)
Alkaline Phosphatase: 63 IU/L (ref 39–117)
BUN/Creatinine Ratio: 18 (ref 12–28)
BUN: 14 mg/dL (ref 8–27)
Bilirubin Total: <0.2 mg/dL (ref 0.0–1.2)
CO2: 21 mmol/L (ref 20–29)
Calcium: 9.1 mg/dL (ref 8.7–10.3)
Chloride: 105 mmol/L (ref 96–106)
Creatinine, Ser: 0.77 mg/dL (ref 0.57–1.00)
GFR calc Af Amer: 86 mL/min/{1.73_m2} (ref >59)
GFR calc non Af Amer: 74 mL/min/{1.73_m2} (ref >59)
Globulin, Total: 2.9 g/dL (ref 1.5–4.5)
Glucose: 87 mg/dL (ref 65–99)
Potassium: 4.3 mmol/L (ref 3.5–5.2)
Sodium: 138 mmol/L (ref 134–144)
Total Protein: 6.9 g/dL (ref 6.0–8.5)

## 2019-10-22 LAB — LIPID PANEL
Chol/HDL Ratio: 4 ratio (ref 0.0–4.4)
Cholesterol, Total: 182 mg/dL (ref 100–199)
HDL: 46 mg/dL (ref >39)
LDL Chol Calc (NIH): 102 mg/dL — ABNORMAL HIGH (ref 0–99)
Triglycerides: 199 mg/dL — ABNORMAL HIGH (ref 0–149)
VLDL Cholesterol Cal: 34 mg/dL (ref 5–40)

## 2019-10-22 LAB — CBC WITH DIFFERENTIAL/PLATELET
Basophils Absolute: 0.1 10*3/uL (ref 0.0–0.2)
Basos: 1 %
EOS (ABSOLUTE): 0.2 10*3/uL (ref 0.0–0.4)
Eos: 4 %
Hematocrit: 32 % — ABNORMAL LOW (ref 34.0–46.6)
Hemoglobin: 10 g/dL — ABNORMAL LOW (ref 11.1–15.9)
Immature Grans (Abs): 0 10*3/uL (ref 0.0–0.1)
Immature Granulocytes: 0 %
Lymphocytes Absolute: 1.8 10*3/uL (ref 0.7–3.1)
Lymphs: 27 %
MCH: 27.6 pg (ref 26.6–33.0)
MCHC: 31.3 g/dL — ABNORMAL LOW (ref 31.5–35.7)
MCV: 88 fL (ref 79–97)
Monocytes Absolute: 0.5 10*3/uL (ref 0.1–0.9)
Monocytes: 7 %
Neutrophils Absolute: 4 10*3/uL (ref 1.4–7.0)
Neutrophils: 61 %
Platelets: 373 10*3/uL (ref 150–450)
RBC: 3.62 x10E6/uL — ABNORMAL LOW (ref 3.77–5.28)
RDW: 15 % (ref 11.7–15.4)
WBC: 6.5 10*3/uL (ref 3.4–10.8)

## 2019-10-22 LAB — THYROID PANEL WITH TSH
Free Thyroxine Index: 2 (ref 1.2–4.9)
T3 Uptake Ratio: 24 % (ref 24–39)
T4, Total: 8.2 ug/dL (ref 4.5–12.0)
TSH: 1.48 u[IU]/mL (ref 0.450–4.500)

## 2019-10-24 ENCOUNTER — Ambulatory Visit (INDEPENDENT_AMBULATORY_CARE_PROVIDER_SITE_OTHER): Payer: Medicare Other | Admitting: Licensed Clinical Social Worker

## 2019-10-24 DIAGNOSIS — E785 Hyperlipidemia, unspecified: Secondary | ICD-10-CM

## 2019-10-24 DIAGNOSIS — M159 Polyosteoarthritis, unspecified: Secondary | ICD-10-CM | POA: Diagnosis not present

## 2019-10-24 DIAGNOSIS — I7301 Raynaud's syndrome with gangrene: Secondary | ICD-10-CM

## 2019-10-24 DIAGNOSIS — I10 Essential (primary) hypertension: Secondary | ICD-10-CM

## 2019-10-24 DIAGNOSIS — K219 Gastro-esophageal reflux disease without esophagitis: Secondary | ICD-10-CM

## 2019-10-24 DIAGNOSIS — F3342 Major depressive disorder, recurrent, in full remission: Secondary | ICD-10-CM

## 2019-10-24 NOTE — Chronic Care Management (AMB) (Signed)
Chronic Care Management    Clinical Social Work Follow Up Note  10/24/2019 Name: FUJIKO BAUDOIN MRN: [id number] DOB: [birth year]  ADDALYNN SOKOLOV is a 79 y.o. year old female who is a primary care patient of [name], [place]. The CCM team was consulted for assistance with Mental Health Counseling and Resources.   Review of patient status, including review of consultants reports, other relevant assessments, and collaboration with appropriate care team members and the patient's provider was performed as part of comprehensive patient evaluation and provision of chronic care management services.    SDOH (Social Determinants of Health) assessments performed: Yes ; risk for depression; risk for stress    Clinical Support from 04/26/2019 in [place]  PHQ-9 Total Score  9     GAD 7 : Generalized Anxiety Score 04/14/2019  Nervous, Anxious, on Edge 2  Control/stop worrying 1  Worry too much - different things 1  Trouble relaxing 1  Restless 0  Easily annoyed or irritable 0  Afraid - awful might happen 1  Total GAD 7 Score 6    Outpatient Encounter Medications as of 10/24/2019  Medication Sig  . Cholecalciferol (VITAMIN D3) 2000 units TABS Take 2,000 Units by mouth daily.  . ferrous sulfate 325 (65 FE) MG tablet Take 1 tablet (325 mg total) by mouth 2 (two) times daily with a meal.  . meclizine (ANTIVERT) 25 MG tablet Take 1 tablet (25 mg total) by mouth 3 (three) times daily as needed for dizziness.  Marland Kitchen NIFEdipine (PROCARDIA XL/NIFEDICAL XL) 60 MG 24 hr tablet Take 1 tablet (60 mg total) by mouth daily.  Marland Kitchen omeprazole (PRILOSEC) 20 MG capsule TAKE 1 TABLET DAILY  . sildenafil (REVATIO) 20 MG tablet Take 1 tablet (20 mg total) by mouth 3 (three) times daily.  Marland Kitchen SYNTHROID 75 MCG tablet Take 1 tablet (75 mcg total) by mouth daily before breakfast.   No facility-administered encounter medications on file as of 10/24/2019.    Goals Addressed              This Visit's Progress   . Client will talk with LCSW in next 30 days to discuss stress issues of client and managing stress issues faced (pt-stated)       CARE PLAN ENTRY (see longitudinal plan of care for additional care plan information)  Current Barriers:  . Pain barriers . Anemia in patient with Chronic Diagnoses of [name], OA, Depression, GERD, HTN, HLD . Fatigue . Potential for falls  Clinical Social Work Clinical Goal(s):  Marland Kitchen LCSW will call client in next 30 days to talk with client about managing stress issues of client  Interventions: . Talked with client about CCM program support . Encouraged client to talk with RNCM to discuss nursing needs of client . Talked with client about stress issues of client . Provided counseling support for client . Talked with client about pain issues of client . Talked with client about health issues of her spouse . Talked with client about sleeping challenges of client . Talked with client about relaxation techniques (enjoys crafts, reading,cares for pets) . Talked with client about her decreased appetite . Talked with client about ambulation challenges of client ( client said she has Vertigo  Patient Self Care Activities:  Does daily ADLS independently Takes medications as prescribed Attends medical appointments  Patient Self Care Deficits . Anemia . Weakness, fatigue . Fall potential  Initial goal documentation       Follow Up Plan:  LCSW to call client in next 4 weeks to talk with her about stress issues faced by client and to talk about client strategies for managing stress issues faced  Norva Riffle.Alizah Sills MSW, LCSW Licensed Clinical Social Worker [facility] Care Management [phone]

## 2019-10-24 NOTE — Patient Instructions (Addendum)
Licensed Clinical Social Worker Visit Information  Goals we discussed today:  Goals Addressed            This Visit's Progress   . Client will talk with LCSW in next 30 days to discuss stress issues of client and managing stress issues faced (pt-stated)       CARE PLAN ENTRY (see longitudinal plan of care for additional care plan information)  Current Barriers:  . Pain barriers . Anemia in patient with Chronic Diagnoses of [name], OA, Depression, GERD,  . HLD, HTN . Fatigue . Potential for falls  Clinical Social Work Clinical Goal(s):  Marland Kitchen LCSW will call client in next 30 days to talk with client about managing stress issues of client  Interventions: . Talked with client about CCM program support . Encouraged client to talk with RNCM to discuss nursing needs of client . Talked with client about stress issues of client . Provided counseling support for client . Talked with client about pain issues of client . Talked with client about health issues of her spouse . Talked with client about sleeping challenges of client . Talked with client about relaxation techniques (enjoys crafts, reading,cares for pets) . Talked with client about her decreased appetite . Talked with client about ambulation challenges of client ( client said she has Vertigo)  Patient Self Care Activities:  Does daily ADLS independently Takes medications as prescribed Attends medical appointments  Patient Self Care Deficits . Anemia . Weakness, fatigue . Fall potential  Initial goal documentation      Materials Provided: No  Follow Up Plan: LCSW to call client in next 4 weeks to talk with her about stress issues faced by client and to talk about client strategies for managing stress issues faced  The patient verbalized understanding of instructions provided today and declined a print copy of patient instruction materials.   Norva Riffle.Carey Lafon MSW, LCSW Licensed Clinical Social  Worker [facility] [phone]

## 2019-10-31 ENCOUNTER — Other Ambulatory Visit: Payer: Self-pay

## 2019-10-31 ENCOUNTER — Encounter: Payer: Self-pay | Admitting: Family

## 2019-10-31 ENCOUNTER — Telehealth: Payer: Self-pay | Admitting: Nurse Practitioner

## 2019-10-31 ENCOUNTER — Ambulatory Visit (INDEPENDENT_AMBULATORY_CARE_PROVIDER_SITE_OTHER): Payer: Medicare Other | Admitting: Family

## 2019-10-31 DIAGNOSIS — M546 Pain in thoracic spine: Secondary | ICD-10-CM

## 2019-10-31 MED ORDER — BACLOFEN 5 MG PO TABS: 5.0000 mg | ORAL_TABLET | Freq: Two times a day (BID) | ORAL | 1 refills | Status: DC | PRN

## 2019-10-31 MED ORDER — BACLOFEN 10 MG PO TABS: 5.0000 mg | ORAL_TABLET | Freq: Three times a day (TID) | ORAL | 0 refills | Status: DC

## 2019-10-31 MED ORDER — DICLOFENAC SODIUM 75 MG PO TBEC: 75.0000 mg | DELAYED_RELEASE_TABLET | Freq: Two times a day (BID) | ORAL | 0 refills | Status: DC

## 2019-10-31 NOTE — Telephone Encounter (Signed)
Patient's insurance does not cover 5mg  Baclofen. They will cover the 10mg . Per pharmacy the tablet is safe to break in half. Are you ok to change to 10mg .

## 2019-10-31 NOTE — Telephone Encounter (Signed)
RX changed and sent to pharmacy

## 2019-10-31 NOTE — Progress Notes (Signed)
Virtual Visit via telephone Note Due to COVID-19 pandemic this visit was conducted virtually. This visit type was conducted due to national recommendations for restrictions regarding the COVID-19 Pandemic (e.g. social distancing, sheltering in place) in an effort to limit this patient's exposure and mitigate transmission in our community. All issues noted in this document were discussed and addressed.  A physical exam was not performed with this format.  I connected with Haley Trevino on 10/31/19 at 2:08 pm by telephone and verified that I am speaking with the correct person using two identifiers. Haley Trevino is currently located at home  and no one is currently with her during visit. The provider, Evelina Dun, FNP is located in their office at time of visit.  I discussed the limitations, risks, security and privacy concerns of performing an evaluation and management service by telephone and the availability of in person appointments. I also discussed with the patient that there may be a patient responsible charge related to this service. The patient expressed understanding and agreed to proceed.   History and Present Illness:  Pt calls the office today with thoracic back pain that started last week. She reports she did a "lot of spring cleaning" the week prior. She has taken tylenol and motrin with mild relief.  Back Pain This is a new problem. The current episode started in the past 7 days. The problem occurs constantly. The problem has been waxing and waning since onset. The pain is present in the thoracic spine. The quality of the pain is described as aching. The pain is at a severity of 9/10. The pain is moderate. The symptoms are aggravated by bending and standing. Pertinent negatives include no bladder incontinence, bowel incontinence, chest pain, leg pain, numbness, paresis or tingling. Risk factors include sedentary lifestyle. She has tried bed rest and NSAIDs for the symptoms.  The treatment provided mild relief.      Review of Systems  Cardiovascular: Negative for chest pain.  Gastrointestinal: Negative for bowel incontinence.  Genitourinary: Negative for bladder incontinence.  Musculoskeletal: Positive for back pain.  Neurological: Negative for tingling and numbness.  All other systems reviewed and are negative.    Observations/Objective: No SOB or distress noted   Assessment and Plan: 1. Acute thoracic back pain, unspecified back pain laterality Rest Ice No other NSAID's while taking diclofenac  Sedation precautions discussed  RTO if symptoms worsen or do not improve  - diclofenac (VOLTAREN) 75 MG EC tablet; Take 1 tablet (75 mg total) by mouth 2 (two) times daily.  Dispense: 30 tablet; Refill: 0 - Baclofen 5 MG TABS; Take 5 mg by mouth 2 (two) times daily as needed.  Dispense: 60 tablet; Refill: 1     I discussed the assessment and treatment plan with the patient. The patient was provided an opportunity to ask questions and all were answered. The patient agreed with the plan and demonstrated an understanding of the instructions.   The patient was advised to call back or seek an in-person evaluation if the symptoms worsen or if the condition fails to improve as anticipated.  The above assessment and management plan was discussed with the patient. The patient verbalized understanding of and has agreed to the management plan. Patient is aware to call the clinic if symptoms persist or worsen. Patient is aware when to return to the clinic for a follow-up visit. Patient educated on when it is appropriate to go to the emergency department.   Time call ended:  2:18 pm  I provided 10 minutes of non-face-to-face time during this encounter.    Evelina Dun, FNP

## 2019-11-08 ENCOUNTER — Telehealth: Payer: Self-pay | Admitting: Nurse Practitioner

## 2019-11-08 DIAGNOSIS — M546 Pain in thoracic spine: Secondary | ICD-10-CM

## 2019-11-08 NOTE — Telephone Encounter (Signed)
X-ray ordered.

## 2019-11-08 NOTE — Telephone Encounter (Signed)
Patient aware - patient also aware we do not have xray.  She will call later in the week on the morning she plans to come to make sure we have xray.

## 2019-11-09 ENCOUNTER — Ambulatory Visit (INDEPENDENT_AMBULATORY_CARE_PROVIDER_SITE_OTHER): Payer: Medicare Other

## 2019-11-09 ENCOUNTER — Other Ambulatory Visit: Payer: Self-pay | Admitting: Family Medicine

## 2019-11-09 ENCOUNTER — Other Ambulatory Visit: Payer: Self-pay

## 2019-11-09 ENCOUNTER — Other Ambulatory Visit: Payer: Medicare Other

## 2019-11-09 DIAGNOSIS — R0781 Pleurodynia: Secondary | ICD-10-CM | POA: Diagnosis not present

## 2019-11-09 DIAGNOSIS — M546 Pain in thoracic spine: Secondary | ICD-10-CM

## 2019-11-11 ENCOUNTER — Telehealth: Payer: Self-pay | Admitting: Nurse Practitioner

## 2019-11-11 NOTE — Telephone Encounter (Signed)
Called Patient back [facility] her on results and went over with patient she verbalized understanding

## 2019-11-17 ENCOUNTER — Telehealth: Payer: Self-pay | Admitting: Nurse Practitioner

## 2019-11-17 ENCOUNTER — Encounter: Payer: Self-pay | Admitting: Nurse Practitioner

## 2019-11-17 ENCOUNTER — Ambulatory Visit (INDEPENDENT_AMBULATORY_CARE_PROVIDER_SITE_OTHER): Payer: Medicare Other | Admitting: Nurse Practitioner

## 2019-11-17 DIAGNOSIS — R0781 Pleurodynia: Secondary | ICD-10-CM

## 2019-11-17 DIAGNOSIS — M546 Pain in thoracic spine: Secondary | ICD-10-CM | POA: Diagnosis not present

## 2019-11-17 MED ORDER — DICLOFENAC SODIUM 75 MG PO TBEC: 75.0000 mg | DELAYED_RELEASE_TABLET | Freq: Two times a day (BID) | ORAL | 0 refills | Status: DC

## 2019-11-17 NOTE — Telephone Encounter (Signed)
Appt made for back pain and shoulder pain. Patient is calling to check on prolia is it time for her to have another.

## 2019-11-17 NOTE — Telephone Encounter (Signed)
I had contacted patient in November of 2020 to schedule her appointment for her Prolia injection.  At that time, patient did not want to come in and said she would call back to schedule at a later date.  I returned patient's call today and informed her I would submit for benefit verification and call her back to schedule once this information was received.

## 2019-11-17 NOTE — Progress Notes (Signed)
Virtual Visit via telephone Note Due to COVID-19 pandemic this visit was conducted virtually. This visit type was conducted due to national recommendations for restrictions regarding the COVID-19 Pandemic (e.g. social distancing, sheltering in place) in an effort to limit this patient's exposure and mitigate transmission in our community. All issues noted in this document were discussed and addressed.  A physical exam was not performed with this format.  I connected with Haley Trevino on 11/17/19 at 1:22 by telephone and verified that I am speaking with the correct person using two identifiers. Haley Trevino is currently located at home and no one  is currently with her during visit. The provider, Mary-Margaret Hassell Done, FNP is located in their office at time of visit.  I discussed the limitations, risks, security and privacy concerns of performing an evaluation and management service by telephone and the availability of in person appointments. I also discussed with the patient that there may be a patient responsible charge related to this service. The patient expressed understanding and agreed to proceed.   History and Present Illness:   Chief Complaint: Shoulder Pain   HPI Patient call in for an appointment c/o going to the grocery store tuesday and was putting groceries in the car and felt a pop in both shoulder blades. Has been hurting every since. Pain goes all the way across her back. She has a rib fracture a well and is hurting a lot.  Was given voltaren for rib pain and that helped but she has ran out.   Review of Systems  Constitutional: Negative for diaphoresis and weight loss.  Eyes: Negative for blurred vision, double vision and pain.  Respiratory: Negative for shortness of breath.   Cardiovascular: Negative for chest pain, palpitations, orthopnea and leg swelling.  Gastrointestinal: Negative for abdominal pain.  Musculoskeletal: Positive for joint pain (shoulder  pain).  Skin: Negative for rash.  Neurological: Negative for dizziness, sensory change, loss of consciousness, weakness and headaches.  Endo/Heme/Allergies: Negative for polydipsia. Does not bruise/bleed easily.  Psychiatric/Behavioral: Negative for memory loss. The patient does not have insomnia.   All other systems reviewed and are negative.    Observations/Objective: Alert and oriented- answers all questions appropriately moderate distress    Assessment and Plan: Haley Trevino in today with chief complaint of Shoulder Pain   1. Rib pain on right side  2. Acute bilateral thoracic back pain Meds ordered this encounter  Medications  . diclofenac (VOLTAREN) 75 MG EC tablet    Sig: Take 1 tablet (75 mg total) by mouth 2 (two) times daily.    Dispense:  30 tablet    Refill:  0    Order Specific Question:   Supervising Provider    Answer:   Caryl Pina A [id number]   Moist heat Stretches Call if no better     Follow Up Instructions: prn    I discussed the assessment and treatment plan with the patient. The patient was provided an opportunity to ask questions and all were answered. The patient agreed with the plan and demonstrated an understanding of the instructions.   The patient was advised to call back or seek an in-person evaluation if the symptoms worsen or if the condition fails to improve as anticipated.  The above assessment and management plan was discussed with the patient. The patient verbalized understanding of and has agreed to the management plan. Patient is aware to call the clinic if symptoms persist or worsen. Patient is aware when to  return to the clinic for a follow-up visit. Patient educated on when it is appropriate to go to the emergency department.   Time call ended:  1:36 I provided 14 minutes of non-face-to-face time during this encounter.    Mary-Margaret Hassell Done, FNP

## 2019-11-22 ENCOUNTER — Telehealth: Payer: Self-pay

## 2019-12-02 ENCOUNTER — Other Ambulatory Visit: Payer: Self-pay

## 2019-12-02 ENCOUNTER — Encounter: Payer: Self-pay | Admitting: Family

## 2019-12-02 ENCOUNTER — Ambulatory Visit (INDEPENDENT_AMBULATORY_CARE_PROVIDER_SITE_OTHER): Payer: Medicare Other | Admitting: Family

## 2019-12-02 VITALS — BP 148/64 | HR 79 | Temp 99.1°F | Ht 62.0 in | Wt 114.0 lb

## 2019-12-02 DIAGNOSIS — M546 Pain in thoracic spine: Secondary | ICD-10-CM | POA: Diagnosis not present

## 2019-12-02 MED ORDER — BACLOFEN 10 MG PO TABS: 5.0000 mg | ORAL_TABLET | Freq: Three times a day (TID) | ORAL | 1 refills | Status: DC

## 2019-12-02 MED ORDER — KETOROLAC TROMETHAMINE 60 MG/2ML IM SOLN
60.0000 mg | Freq: Once | INTRAMUSCULAR | Status: AC
  Administered 2019-12-02: 60 mg via INTRAMUSCULAR

## 2019-12-02 MED ORDER — METHYLPREDNISOLONE ACETATE 80 MG/ML IJ SUSP
80.0000 mg | Freq: Once | INTRAMUSCULAR | Status: AC
  Administered 2019-12-02: 80 mg via INTRAMUSCULAR

## 2019-12-02 NOTE — Patient Instructions (Signed)
Thoracic Strain A thoracic strain, which is sometimes called a mid-back strain, is an injury to the muscles or tendons that attach to the upper part of your back behind your chest. This type of injury occurs when a muscle is overstretched or overloaded. Thoracic strains can range from mild to severe. Mild strains may involve stretching a muscle or tendon without tearing it. These injuries may heal in 1-2 weeks. More severe strains involve tearing of muscle fibers or tendons. These will cause more pain and may take 6-8 weeks to heal. What are the causes? This condition may be caused by:  Trauma, such as a fall or a hit to the body.  Twisting or overstretching the back. This may result from doing activities that require a lot of energy, such as lifting heavy objects. In some cases, the cause may not be known. What increases the risk? This injury is more common in:  Athletes.  People with obesity. What are the signs or symptoms? The main symptom of this condition is pain in the middle back, especially with movement. Other symptoms include:  Stiffness or limited range of motion.  Sudden muscle tightening (spasms). How is this diagnosed? This condition may be diagnosed based on:  Your symptoms.  Your medical history.  A physical exam.  Imaging tests, such as X-rays or an MRI. How is this treated? This condition may be treated with:  Resting the injured area.  Applying heat and cold to the injured area.  Over-the-counter medicines for pain and inflammation, such as NSAIDs.  Prescription pain medicine or muscle relaxants may be needed for a short time.  Physical therapy. This will involve doing stretching and strengthening exercises. Follow these instructions at home: Managing pain, stiffness, and swelling      If directed, put ice on the injured area. ? Put ice in a plastic bag. ? Place a towel between your skin and the bag. ? Leave the ice on for 20 minutes, 2-3 times  a day.  If directed, apply heat to the affected area as often as told by your health care provider. Use the heat source that your health care provider recommends, such as a moist heat pack or a heating pad. ? Place a towel between your skin and the heat source. ? Leave the heat on for 20-30 minutes. ? Remove the heat if your skin turns bright red. This is especially important if you are unable to feel pain, heat, or cold. You may have a greater risk of getting burned. Activity  Rest and return to your normal activities as told by your health care provider. Ask your health care provider what activities are safe for you.  Do exercises as told by your health care provider. Medicines  Take over-the-counter and prescription medicines only as told by your health care provider.  Ask your health care provider if the medicine prescribed to you: ? Requires you to avoid driving or using heavy machinery. ? Can cause constipation. You may need to take these actions to prevent or treat constipation:  Drink enough fluid to keep your urine pale yellow.  Take over-the-counter or prescription medicines.  Eat foods that are high in fiber, such as beans, whole grains, and fresh fruits and vegetables.  Limit foods that are high in fat and processed sugars, such as fried or sweet foods. Injury prevention To prevent a future mid-back injury:  Always warm up properly before physical activity or sports.  Cool down and stretch after being active.  Use correct form when playing sports and lifting heavy objects. Bend your knees before you lift heavy objects. °· Use good posture when sitting and standing. °· Stay physically fit and maintain a healthy weight. °? Do at least 150 minutes of moderate-intensity exercise each week, such as brisk walking or water aerobics. °? Do strength exercises at least 2 times each week. ° °General instructions °· Do not use any products that contain nicotine or tobacco, such as  cigarettes, e-cigarettes, and chewing tobacco. If you need help quitting, ask your health care provider. °· Keep all follow-up visits as told by your health care provider. This is important. °Contact a health care provider if: °· Your pain is not helped by medicine. °· Your pain or stiffness is getting worse. °· You develop pain or stiffness in your neck or lower back. °Get help right away if you: °· Have shortness of breath. °· Have chest pain. °· Develop numbness or weakness in your legs or arms. °· Have involuntary loss of urine (urinary incontinence). °Summary °· A thoracic strain, which is sometimes called a mid-back strain, is an injury to the muscles or tendons that attach to the upper part of your back behind your chest. °· This type of injury occurs when a muscle is overstretched or overloaded. °· Rest and return to your normal activities as told by your health care provider. If directed, apply heat or ice to the affected area as often as told by your health care provider. °· Take over-the-counter and prescription medicines only as told by your health care provider. °· Contact a health care provider if you have new or worsening symptoms. °This information is not intended to replace advice given to you by your health care provider. Make sure you discuss any questions you have with your health care provider. °Document Revised: 05/11/2018 Document Reviewed: 05/11/2018 °Elsevier Patient Education © 2020 [employer] ° °

## 2019-12-02 NOTE — Progress Notes (Signed)
Subjective:    Patient ID: Haley Trevino, female    DOB: [birth year], 79 y.o.   MRN: [id number]  Chief Complaint  Patient presents with  . Shoulder Pain   Pt presents to the office today for recurrent thoracic pain that started over a month ago. She has been given Baclofen and diclofenac 75 mg BID without relief.   Denies any dysuria, frequency, or UTI symptoms.  Shoulder Pain   Back Pain This is a recurrent problem. The current episode started more than 1 month ago. The problem occurs intermittently. The problem has been gradually worsening since onset. The pain is present in the thoracic spine. The quality of the pain is described as aching. The pain is at a severity of 10/10. The pain is moderate. The symptoms are aggravated by lying down, bending and twisting. She has tried NSAIDs for the symptoms. The treatment provided mild relief.      Review of Systems  Musculoskeletal: Positive for back pain.  All other systems reviewed and are negative.      Objective:   Physical Exam Vitals reviewed.  Constitutional:      General: She is not in acute distress.    Appearance: She is well-developed.  HENT:     Head: Normocephalic and atraumatic.  Eyes:     Pupils: Pupils are equal, round, and reactive to light.  Neck:     Thyroid: No thyromegaly.  Cardiovascular:     Rate and Rhythm: Normal rate and regular rhythm.     Heart sounds: Normal heart sounds. No murmur.  Pulmonary:     Effort: Pulmonary effort is normal. No respiratory distress.     Breath sounds: Normal breath sounds. No wheezing.  Abdominal:     General: Bowel sounds are normal. There is no distension.     Palpations: Abdomen is soft.     Tenderness: There is no abdominal tenderness.  Musculoskeletal:        General: No tenderness. Normal range of motion.       Arms:     Cervical back: Normal range of motion and neck supple.     Comments: Pain in right thoracic area with extending arms, negative CVA  tenderness  Skin:    General: Skin is warm and dry.  Neurological:     Mental Status: She is alert and oriented to person, place, and time.     Cranial Nerves: No cranial nerve deficit.     Deep Tendon Reflexes: Reflexes are normal and symmetric.  Psychiatric:        Behavior: Behavior normal.        Thought Content: Thought content normal.        Judgment: Judgment normal.     BP (!) 148/64   Pulse 79   Temp 99.1 F (37.3 C) (Temporal)   Ht 5\' 2"  (1.575 m)   Wt 114 lb (51.7 kg)   BMI 20.85 kg/m       Assessment & Plan:  ZYIANNA MACRINA comes in today with chief complaint of Shoulder Pain   Diagnosis and orders addressed:  1. Acute right-sided thoracic back pain Rest Ice ROM exercises encouraged, PT referral pending Will refill Baclofen as she states this helped, sedation precautions discussed  - methylPREDNISolone acetate (DEPO-MEDROL) injection 80 mg - ketorolac (TORADOL) injection 60 mg - baclofen (LIORESAL) 10 MG tablet; Take 0.5 tablets (5 mg total) by mouth 3 (three) times daily.  Dispense: 60 each; Refill: 1 - Ambulatory referral to  Physical Therapy   Evelina Dun, FNP

## 2019-12-19 ENCOUNTER — Ambulatory Visit: Payer: Medicare Other | Admitting: Nurse Practitioner

## 2019-12-20 ENCOUNTER — Ambulatory Visit: Payer: Medicare Other | Admitting: Physical Therapy

## 2019-12-20 ENCOUNTER — Encounter: Payer: Self-pay | Admitting: Nurse Practitioner

## 2019-12-20 ENCOUNTER — Ambulatory Visit (INDEPENDENT_AMBULATORY_CARE_PROVIDER_SITE_OTHER): Payer: Medicare Other

## 2019-12-20 ENCOUNTER — Other Ambulatory Visit: Payer: Self-pay

## 2019-12-20 ENCOUNTER — Ambulatory Visit (INDEPENDENT_AMBULATORY_CARE_PROVIDER_SITE_OTHER): Payer: Medicare Other | Admitting: Nurse Practitioner

## 2019-12-20 VITALS — BP 139/64 | HR 75 | Temp 96.5°F | Resp 20 | Ht 62.0 in | Wt 108.0 lb

## 2019-12-20 DIAGNOSIS — R0781 Pleurodynia: Secondary | ICD-10-CM | POA: Diagnosis not present

## 2019-12-20 DIAGNOSIS — J984 Other disorders of lung: Secondary | ICD-10-CM | POA: Diagnosis not present

## 2019-12-20 DIAGNOSIS — M546 Pain in thoracic spine: Secondary | ICD-10-CM | POA: Diagnosis not present

## 2019-12-20 MED ORDER — TRAMADOL HCL 50 MG PO TABS: 50.0000 mg | ORAL_TABLET | Freq: Three times a day (TID) | ORAL | 0 refills | Status: AC | PRN

## 2019-12-20 MED ORDER — KETOROLAC TROMETHAMINE 60 MG/2ML IM SOLN
60.0000 mg | Freq: Once | INTRAMUSCULAR | Status: AC
  Administered 2019-12-20: 60 mg via INTRAMUSCULAR

## 2019-12-20 NOTE — Progress Notes (Signed)
   Subjective:    Patient ID: Haley Trevino, female    DOB: 03-Nov-1940, 79 y.o.   MRN: [id number]   Chief Complaint: Haley Trevino Thoracic strain   HPI Patient come sin today C/O thoracic strain. She saw C. Hawks on 12/02/19. She was given a toradol shot and depomedrol shot and baclofen. She says she is hrting so bad that she cannot stand it. Pain is entire rib cage and across shoulder blades. Rates pain 10/10 . Any movement at all increases pain. Says she cannot sleep and is not able to eat.    Review of Systems  Constitutional: Negative for diaphoresis.  Eyes: Negative for pain.  Respiratory: Negative for shortness of breath.   Cardiovascular: Negative for chest pain, palpitations and leg swelling.  Gastrointestinal: Negative for abdominal pain.  Endocrine: Negative for polydipsia.  Skin: Negative for rash.  Neurological: Negative for dizziness, weakness and headaches.  Hematological: Does not bruise/bleed easily.  All other systems reviewed and are negative.      Objective:   Physical Exam Vitals and nursing note reviewed.  Constitutional:      General: She is in acute distress.     Appearance: Normal appearance.  Cardiovascular:     Rate and Rhythm: Normal rate and regular rhythm.     Heart sounds: Normal heart sounds.  Skin:    General: Skin is warm.  Neurological:     General: No focal deficit present.     Mental Status: She is alert and oriented to person, place, and time.  Psychiatric:        Mood and Affect: Mood normal.        Behavior: Behavior normal.    BP 139/64   Pulse 75   Temp (!) 96.5 F (35.8 C) (Temporal)   Resp 20   Ht 5\' 2"  (1.575 m)   Wt 108 lb (49 kg)   SpO2 97%   BMI 19.75 kg/m   Chest xrya- no displaced rib fracture- will wait on radiology report-Preliminary reading by Ronnald Collum, FNP  [facility]       Assessment & Plan:  [name] in today with chief complaint of Recheck Thoracic strain   1. Acute right-sided thoracic back  pain Moist heat Rest Cough and deep breathe every 2 hours while splinting right flank with a pillow - DG Chest 2 View - ketorolac (TORADOL) injection 60 mg RTO prn   The above assessment and management plan was discussed with the patient. The patient verbalized understanding of and has agreed to the management plan. Patient is aware to call the clinic if symptoms persist or worsen. Patient is aware when to return to the clinic for a follow-up visit. Patient educated on when it is appropriate to go to the emergency department.   Mary-Margaret Hassell Done, FNP

## 2019-12-27 ENCOUNTER — Ambulatory Visit: Payer: Medicare Other | Attending: Family | Admitting: Physical Therapy

## 2019-12-27 ENCOUNTER — Encounter: Payer: Self-pay | Admitting: Physical Therapy

## 2019-12-27 ENCOUNTER — Other Ambulatory Visit: Payer: Self-pay

## 2019-12-27 ENCOUNTER — Ambulatory Visit (INDEPENDENT_AMBULATORY_CARE_PROVIDER_SITE_OTHER): Payer: Medicare Other | Admitting: Licensed Clinical Social Worker

## 2019-12-27 DIAGNOSIS — M546 Pain in thoracic spine: Secondary | ICD-10-CM

## 2019-12-27 DIAGNOSIS — I10 Essential (primary) hypertension: Secondary | ICD-10-CM | POA: Diagnosis not present

## 2019-12-27 DIAGNOSIS — F3342 Major depressive disorder, recurrent, in full remission: Secondary | ICD-10-CM | POA: Diagnosis not present

## 2019-12-27 DIAGNOSIS — K219 Gastro-esophageal reflux disease without esophagitis: Secondary | ICD-10-CM

## 2019-12-27 DIAGNOSIS — R293 Abnormal posture: Secondary | ICD-10-CM | POA: Insufficient documentation

## 2019-12-27 DIAGNOSIS — M810 Age-related osteoporosis without current pathological fracture: Secondary | ICD-10-CM

## 2019-12-27 DIAGNOSIS — M159 Polyosteoarthritis, unspecified: Secondary | ICD-10-CM | POA: Diagnosis not present

## 2019-12-27 DIAGNOSIS — E039 Hypothyroidism, unspecified: Secondary | ICD-10-CM | POA: Diagnosis not present

## 2019-12-27 DIAGNOSIS — E785 Hyperlipidemia, unspecified: Secondary | ICD-10-CM

## 2019-12-27 DIAGNOSIS — I7301 Raynaud's syndrome with gangrene: Secondary | ICD-10-CM

## 2019-12-27 NOTE — Chronic Care Management (AMB) (Signed)
Chronic Care Management    Clinical Social Work Follow Up Note  12/27/2019 Name: Haley Trevino MRN: [id number] DOB: March 05, 1941  Haley Trevino is a 79 y.o. year old female who is a primary care patient of [name], [place]. The CCM team was consulted for assistance with [name] .   Review of patient status, including review of consultants reports, other relevant assessments, and collaboration with appropriate care team members and the patient's provider was performed as part of comprehensive patient evaluation and provision of chronic care management services.    SDOH (Social Determinants of Health) assessments performed: No;risk of stress; risk of physical inactivity; risk of transport needs    Office Visit from 12/02/2019 in [place]  PHQ-9 Total Score 5       GAD 7 : Generalized Anxiety Score 10/24/2019 04/14/2019  Nervous, Anxious, on Edge 3 2  Control/stop worrying 1 1  Worry too much - different things 1 1  Trouble relaxing 0 1  Restless 0 0  Easily annoyed or irritable 0 0  Afraid - awful might happen 0 1  Total GAD 7 Score 5 6  Anxiety Difficulty Somewhat difficult -    Outpatient Encounter Medications as of 12/27/2019  Medication Sig  . baclofen (LIORESAL) 10 MG tablet Take 0.5 tablets (5 mg total) by mouth 3 (three) times daily.  . Cholecalciferol (VITAMIN D3) 2000 units TABS Take 2,000 Units by mouth daily.  . diclofenac (VOLTAREN) 75 MG EC tablet Take 1 tablet (75 mg total) by mouth 2 (two) times daily.  . ferrous sulfate 325 (65 FE) MG tablet Take 1 tablet (325 mg total) by mouth 2 (two) times daily with a meal.  . meclizine (ANTIVERT) 25 MG tablet Take 1 tablet (25 mg total) by mouth 3 (three) times daily as needed for dizziness.  Marland Kitchen NIFEdipine (PROCARDIA XL/NIFEDICAL XL) 60 MG 24 hr tablet Take 1 tablet (60 mg total) by mouth daily.  Marland Kitchen omeprazole (PRILOSEC) 20 MG capsule TAKE 1 TABLET DAILY  . sildenafil (REVATIO)  20 MG tablet Take 1 tablet (20 mg total) by mouth 3 (three) times daily.  Marland Kitchen SYNTHROID 75 MCG tablet Take 1 tablet (75 mcg total) by mouth daily before breakfast.  . traMADol (ULTRAM) 50 MG tablet Take 1 tablet (50 mg total) by mouth every 8 (eight) hours as needed for up to 7 days.   No facility-administered encounter medications on file as of 12/27/2019.    Goals Addressed              This Visit's Progress   .  Client will talk with LCSW in next 30 days to discuss stress issues of client and managing stress issues faced (pt-stated)        CARE PLAN ENTRY   Current Barriers:  . Pain barriers in client with chronic diagnoses of OA, Hypothyroidism, Depression, GERD, HTN, HLD, Osteoporosis, Raynaud's Diseased . Anemia  . Fatigue . Potential for falls  Clinical Social Work Clinical Goal(s):  Marland Kitchen LCSW will call client in next 30 days to talk with client about managing stress issues of client  Interventions: . Encouraged client to talk with RNCM to discuss nursing needs of client . Talked with client about pain issues of client . Talked with client about sleeping challenges of client . Talked with client about her decreased appetite . Talked with client about ambulation challenges of client ( client said she has Vertigo) . Talked with client about physical therapy sessions of  client . Talked with client about her recent medical appointments . Talked with client about transport needs of client . Talked with client about meal provision of client . Talked with client about anxiety issues of client   . Talked with client about social support network (has support from her daughter and some help from her spouse) . Collaborated with RNCM regarding nursing needs of client  Patient Self Care Activities:  Does daily ADLS independently Takes medications as prescribed Attends medical appointments  Patient Self Care Deficits . Anemia . Weakness, fatigue . Fall potential  Initial goal  documentation       Follow Up Plan: LCSW to call client in next 4 weeks to talk with her about stress issues faced by client and to talk about client strategies for managing stress issues faced  Norva Riffle.Tzippy Testerman MSW, LCSW Licensed Clinical Social Worker [facility] Care Management [phone]

## 2019-12-27 NOTE — Patient Instructions (Addendum)
Licensed Clinical Social Worker Visit Information  Goals we discussed today:  Goals Addressed              This Visit's Progress   .  Client will talk with LCSW in next 30 days to discuss stress issues of client and managing stress issues faced (pt-stated)        CARE PLAN ENTRY   Current Barriers:  . Pain barriers in client with chronic diagnoses of OA, Hypothyroidism, Depression, GERD, HTN, HLD, Osteoporosis, Raynaud's Diseased . Anemia  . Fatigue . Potential for falls  Clinical Social Work Clinical Goal(s):  Marland Kitchen LCSW will call client in next 30 days to talk with client about managing stress issues of client  Interventions:  Encouraged client to talk with RNCM to discuss nursing needs of client  Talked with client about pain issues of client  Talked with client about sleeping challenges of client  Talked with client about her decreased appetite  Talked with client about ambulation challenges of client ( client said she has Vertigo)  Talked with client about physical therapy sessions of client  Talked with client about her recent medical appointments  Talked with client about transport needs of client  Talked with client about meal provision of client  Talked with client about anxiety issues of client    Talked with client about social support network (has support from her daughter and some help from her spouse)  Collaborated with RNCM regarding nursing needs of client  Patient Self Care Activities:  Does daily ADLS independently Takes medications as prescribed Attends medical appointments  Patient Self Care Deficits . Anemia . Weakness, fatigue . Fall potential  Initial goal documentation       Materials Provided: No  Follow Up Plan: LCSW to call client in next 4 weeks to talk with her about stress issues faced by client and to talk about client strategies for managing stress issues faced  The patient verbalized understanding of instructions provided  today and declined a print copy of patient instruction materials.   Norva Riffle.Haley Trevino MSW, LCSW Licensed Clinical Social Worker [facility] Care Management [phone]

## 2019-12-27 NOTE — Therapy (Signed)
[facility] [place], [place], [address] Phone: [phone]   Fax:  [phone]  Physical Therapy Evaluation  Patient Details  Name: Haley Trevino MRN: [id number] Date of Birth: 1940-08-27 Referring Provider (PT): Evelina Dun, FNP   Encounter Date: 12/27/2019   PT End of Session - 12/27/19 2120    Visit Number 1    Number of Visits 6    Date for PT Re-Evaluation 02/14/20    Authorization Type Progress note every 10th visit    PT Start Time 1300    PT Stop Time 1344    PT Time Calculation (min) 44 min    Activity Tolerance Patient tolerated treatment well    Behavior During Therapy [facility] for tasks assessed/performed           Past Medical History:  Diagnosis Date  . Cataract   . Dyspnea   . Osteoarthritis   . Raynaud disease   . Scleroderma ([place])   . Thyroid disease    hypothyroidism    Past Surgical History:  Procedure Laterality Date  . BREAST ENHANCEMENT SURGERY  1975  . BREAST IMPLANT REMOVAL Bilateral 04/23/2016   Procedure: REMOVALBILATERAL BREAST IMPLANTS;  Surgeon: Wallace Going, DO;  Location: [facility];  Service: Plastics;  Laterality: Bilateral;  . CHOLECYSTECTOMY  1973  . HAND SURGERY  08/2012; 11/2012  . IR RADIOLOGIST EVAL & MGMT  07/27/2017  . IR SACROPLASTY BILATERAL  07/31/2017  . MELANOMA EXCISION     at 75 yrs of age  . ORIF HIP FRACTURE Right 02/22/2014   Procedure: OPEN REDUCTION INTERNAL FIXATION RIGHT HIP;  Surgeon: Sanjuana Kava, MD;  Location: [facility];  Service: Orthopedics;  Laterality: Right;  . TOTAL ABDOMINAL HYSTERECTOMY  1974    There were no vitals filed for this visit.    Subjective Assessment - 12/27/19 2111    Subjective COVID-19 screening performed upon arrival.Patient arrives with reports of thoracic pain and anterior lower rib pain that began in February 2021. Patient reported doing a lot of cleaning and wall washing that may have contributed to  her pain. Patient also reports she is unsure if pain is caused due to a previously ruptured silicone breast implant causing breast implant illness or a rib fracture sustained in February 2021. Patient reports having 2 injections with no relief. Patient reports pain at worst as 10/10 and pain as best as "moderate". Patient reports taking ibuprofen and utilizing a heating pad for pain relief. Patient's goals are to decrease pain, improve movement, and return to performing household activities and ADLs with greater ease.    Pertinent History HTN, Raynaud's Syndrome, Scleroderma, Osteoporosis, h/o R hip ORIF 2015, h/o hand surgery    Limitations Lifting;House hold activities    Diagnostic tests x-ray:Mild compression fracture T10 vertebral body of indeterminate age 40/2021 imaging; Subtle fracture of the anterior left seventh rib. 08/2019 imaging; see imaging tab    Patient Stated Goals decrease pain, improve movement    Currently in Pain? Yes    Pain Score 8     Pain Location Thoracic    Pain Orientation Mid    Pain Descriptors / Indicators Aching;Sharp;Shooting;Sore    Pain Type Chronic pain    Pain Radiating Towards anterior rib cage    Pain Onset More than a month ago    Pain Frequency Constant    Aggravating Factors  "hurts all the time"    Pain Relieving Factors ibuprofen, heating pad helps decrease pain but  doesn't take it away    Effect of Pain on Daily Activities requires a lot of rest              [facility] PT Assessment - 12/27/19 0001      Assessment   Medical Diagnosis Acute right sided thoracic back pain    Referring Provider (PT) Evelina Dun, FNP    Onset Date/Surgical Date --   February 2021   Hand Dominance Right    Next MD Visit 01/23/2020      Precautions   Precautions None      Restrictions   Weight Bearing Restrictions No      Balance Screen   Has the patient fallen in the past 6 months No    Has the patient had a decrease in activity level because of a fear of  falling?  No    Is the patient reluctant to leave their home because of a fear of falling?  No      Home Environment   Living Environment Private residence    Living Arrangements Spouse/significant other      Prior Function   Level of Independence Independent with basic ADLs      Posture/Postural Control   Posture/Postural Control Postural limitations    Postural Limitations Rounded Shoulders;Forward head;Increased thoracic kyphosis   scoliosis     ROM / Strength   AROM / PROM / Strength AROM      AROM   Overall AROM  Deficits;Due to pain    AROM Assessment Site Shoulder    Right/Left Shoulder Left    Left Shoulder Flexion 100 Degrees    Left Shoulder ABduction 103 Degrees    Left Shoulder External Rotation 45 Degrees      Palpation   Palpation comment tenderness to thoracic paraspinals and around to anterior lower rib cage                      Objective measurements completed on examination: See above findings.               PT Education - 12/27/19 2119    Education Details scapular retractions, cross body stretch, corner stretch    Person(s) Educated Patient    Methods Explanation;Demonstration;Handout    Comprehension Verbalized understanding               PT Long Term Goals - 12/27/19 2121      PT LONG TERM GOAL #1   Title Patient will be independent with HEP    Time 6    Period Weeks    Status New      PT LONG TERM GOAL #2   Title Patient will report ability to perform ADLs and home activities with thoracic pain less than or equal to 5/10.    Time 6    Period Weeks    Status New      PT LONG TERM GOAL #3   Title Patient will report ability to dress and lift overhead with thoracic pain less than or equal to 5/10.    Time 6    Period Weeks    Status New      PT LONG TERM GOAL #4   Title ---      PT LONG TERM GOAL #5   Title ---                  Plan - 12/27/19 2139    Clinical Impression Statement Patient is  a  79 year old female who presents to physical therapy with bilateral thoracic pain, lower anterior rib cage pain, decreased bilateral UE ROM that began in February 2021. Patient with increased rounded shoulders and forward head and a scoliotic curve. Patient tender to palpation to bilateral rhomboids. Patient and PT discussed plan of care and discussed HEP to which patient reported understanding. Due to financial constraints patient requested to attend PT at 1x per week with emphasis on HEP. Patient would benefit from skilled physical therapy to address deficits and address patient's goals.    Personal Factors and Comorbidities Comorbidity 3+    Comorbidities HTN, Raynaud's Syndrome, Scleroderma, Osteoporosis, h/o R hip ORIF 2015, h/o hand surgery    Examination-Activity Limitations Reach Overhead;Carry;Dressing    Examination-Participation Restrictions Cleaning    Stability/Clinical Decision Making Stable/Uncomplicated    Clinical Decision Making Moderate    Rehab Potential Fair    PT Frequency 1x / week    PT Duration 6 weeks    PT Treatment/Interventions ADLs/Self Care Home Management;Iontophoresis 4mg /ml Dexamethasone;Moist Heat;Electrical Stimulation;Therapeutic activities;Therapeutic exercise;Neuromuscular re-education;Manual techniques;Passive range of motion;Patient/family education    PT Next Visit Plan postural exercises, UBE if able, STW/M to thoracic paraspinals and rhomboids to decrease pain.    PT Home Exercise Plan see patient education section    Consulted and Agree with Plan of Care Patient           Patient will benefit from skilled therapeutic intervention in order to improve the following deficits and impairments:  Decreased activity tolerance, Decreased strength, Decreased range of motion, Impaired UE functional use, Pain, Postural dysfunction  Visit Diagnosis: Pain in thoracic spine - Plan: PT plan of care cert/re-cert  Abnormal posture - Plan: PT plan of care  cert/re-cert     Problem List Patient Active Problem List   Diagnosis Date Noted  . Murmur 11/18/2017  . Impingement syndrome of left shoulder region 12/08/2014  . Osteoporosis 09/20/2014  . Hip fracture requiring operative repair ([place]) 02/22/2014  . Hypothyroidism 11/29/2013  . Depression 11/29/2013  . GERD (gastroesophageal reflux disease) 11/29/2013  . Hypertension 11/29/2013  . Hyperlipidemia with target LDL less than 100 11/29/2013  . Thyroid cyst 04/26/2013  . MALIGNANT MELANOMA, SKIN 08/07/2010  . [name] 08/06/2010  . SCLERODERMA 08/06/2010  . Osteoarthritis 08/06/2010    Gabriela Eves, PT, DPT 12/27/2019, 9:43 PM  [facility] [address] [place], [place], [address] Phone: [phone]   Fax:  [phone]  Name: SAPHRONIA OZDEMIR MRN: [id number] Date of Birth: [birth year]

## 2020-01-03 ENCOUNTER — Other Ambulatory Visit: Payer: Self-pay

## 2020-01-03 ENCOUNTER — Ambulatory Visit: Payer: Medicare Other | Admitting: Physical Therapy

## 2020-01-03 ENCOUNTER — Encounter: Payer: Self-pay | Admitting: Physical Therapy

## 2020-01-03 DIAGNOSIS — M546 Pain in thoracic spine: Secondary | ICD-10-CM | POA: Diagnosis not present

## 2020-01-03 DIAGNOSIS — R293 Abnormal posture: Secondary | ICD-10-CM

## 2020-01-03 NOTE — Therapy (Signed)
[facility] [place], [place], [address] Phone: [phone]   Fax:  [phone]  Physical Therapy Treatment  Patient Details  Name: Haley Trevino MRN: [id number] Date of Birth: 1940-08-01 Referring Provider (PT): Evelina Dun, FNP   Encounter Date: 01/03/2020   PT End of Session - 01/03/20 1314    Visit Number 2    Number of Visits 6    Date for PT Re-Evaluation 02/14/20    Authorization Type Progress note every 10th visit    PT Start Time 1300    PT Stop Time 1344    PT Time Calculation (min) 44 min    Activity Tolerance Patient tolerated treatment well    Behavior During Therapy [facility] for tasks assessed/performed           Past Medical History:  Diagnosis Date  . Cataract   . Dyspnea   . Osteoarthritis   . Raynaud disease   . Scleroderma ([place])   . Thyroid disease    hypothyroidism    Past Surgical History:  Procedure Laterality Date  . BREAST ENHANCEMENT SURGERY  1975  . BREAST IMPLANT REMOVAL Bilateral 04/23/2016   Procedure: REMOVALBILATERAL BREAST IMPLANTS;  Surgeon: Wallace Going, DO;  Location: [facility];  Service: Plastics;  Laterality: Bilateral;  . CHOLECYSTECTOMY  1973  . HAND SURGERY  08/2012; 11/2012  . IR RADIOLOGIST EVAL & MGMT  07/27/2017  . IR SACROPLASTY BILATERAL  07/31/2017  . MELANOMA EXCISION     at 39 yrs of age  . ORIF HIP FRACTURE Right 02/22/2014   Procedure: OPEN REDUCTION INTERNAL FIXATION RIGHT HIP;  Surgeon: Sanjuana Kava, MD;  Location: [facility];  Service: Orthopedics;  Laterality: Right;  . TOTAL ABDOMINAL HYSTERECTOMY  1974    There were no vitals filed for this visit.   Subjective Assessment - 01/03/20 1313    Subjective COVID-19 screening performed upon arrival. Reports ongoing 6 or 7/10 pain, more in the shoulder than in the back today.    Pertinent History HTN, Raynaud's Syndrome, Scleroderma, Osteoporosis, h/o R hip ORIF 2015, h/o hand surgery      Limitations Lifting;House hold activities    Diagnostic tests x-ray:Mild compression fracture T10 vertebral body of indeterminate age 40/2021 imaging; Subtle fracture of the anterior left seventh rib. 08/2019 imaging; see imaging tab    Patient Stated Goals decrease pain, improve movement    Currently in Pain? Yes    Pain Score 7     Pain Location Shoulder              [facility] PT Assessment - 01/03/20 0001      Assessment   Medical Diagnosis Acute right sided thoracic back pain    Referring Provider (PT) Evelina Dun, FNP    Hand Dominance Right    Next MD Visit 01/23/2020      Precautions   Precautions None      Restrictions   Weight Bearing Restrictions No                         [facility] Adult PT Treatment/Exercise - 01/03/20 0001      Exercises   Exercises Shoulder      Shoulder Exercises: ROM/Strengthening   UBE (Upper Arm Bike) 120 RPM x6 mins (mainly forward, but some backward)      Shoulder Exercises: Stretch   Cross Chest Stretch 3 reps;30 seconds      Modalities   Modalities Electrical  Stimulation;Moist Heat      Moist Heat Therapy   Number Minutes Moist Heat 10 Minutes    Moist Heat Location Other (comment)   thoracic     Electrical Stimulation   Electrical Stimulation Location mid thoracic    Electrical Stimulation Action pre-mod    Electrical Stimulation Parameters 80-150 hz x10 mins    Electrical Stimulation Goals Pain      Manual Therapy   Manual Therapy Soft tissue mobilization    Soft tissue mobilization STW/M to bilateral thoracic paraspinals, rhomboid reigon and lower rib region to decrease tone and pain.                        PT Long Term Goals - 12/27/19 2121      PT LONG TERM GOAL #1   Title Patient will be independent with HEP    Time 6    Period Weeks    Status New      PT LONG TERM GOAL #2   Title Patient will report ability to perform ADLs and home activities with thoracic pain less than or equal to  5/10.    Time 6    Period Weeks    Status New      PT LONG TERM GOAL #3   Title Patient will report ability to dress and lift overhead with thoracic pain less than or equal to 5/10.    Time 6    Period Weeks    Status New      PT LONG TERM GOAL #4   Title ---      PT LONG TERM GOAL #5   Title ---                 Plan - 01/03/20 1459    Clinical Impression Statement Patient arrives with constant thoracic pain and left shoulder pain. Patient able to perform UBE at 120 RPMs forwards but with more discomfort with backwards revolutions therefore patient instructed to continue forward for remaining time. Patient noted with increased tone in R thoracic paraspinals in comparison to left. Tender region in lower region of left thoracic paraspinals during STW/M but tolerable. Patient compliant with HEP but has difficulties performing corner stretch as she does not have a corner of her home that has space. No adverse affects upon removal of modalities.    Personal Factors and Comorbidities Comorbidity 3+    Comorbidities HTN, Raynaud's Syndrome, Scleroderma, Osteoporosis, h/o R hip ORIF 2015, h/o hand surgery    Examination-Activity Limitations Reach Overhead;Carry;Dressing    Examination-Participation Restrictions Cleaning    Stability/Clinical Decision Making Stable/Uncomplicated    Clinical Decision Making Moderate    Rehab Potential Fair    PT Frequency 1x / week    PT Duration 6 weeks    PT Treatment/Interventions ADLs/Self Care Home Management;Iontophoresis 4mg /ml Dexamethasone;Moist Heat;Electrical Stimulation;Therapeutic activities;Therapeutic exercise;Neuromuscular re-education;Manual techniques;Passive range of motion;Patient/family education    PT Next Visit Plan postural exercises, UBE if able, STW/M to thoracic paraspinals and rhomboids to decrease pain.    PT Home Exercise Plan see patient education section    Consulted and Agree with Plan of Care Patient            Patient will benefit from skilled therapeutic intervention in order to improve the following deficits and impairments:  Decreased activity tolerance, Decreased strength, Decreased range of motion, Impaired UE functional use, Pain, Postural dysfunction  Visit Diagnosis: Pain in thoracic spine  Abnormal posture  Problem List Patient Active Problem List   Diagnosis Date Noted  . Murmur 11/18/2017  . Impingement syndrome of left shoulder region 12/08/2014  . Osteoporosis 09/20/2014  . Hip fracture requiring operative repair ([place]) 02/22/2014  . Hypothyroidism 11/29/2013  . Depression 11/29/2013  . GERD (gastroesophageal reflux disease) 11/29/2013  . Hypertension 11/29/2013  . Hyperlipidemia with target LDL less than 100 11/29/2013  . Thyroid cyst 04/26/2013  . MALIGNANT MELANOMA, SKIN 08/07/2010  . [name] 08/06/2010  . SCLERODERMA 08/06/2010  . Osteoarthritis 08/06/2010    Gabriela Eves, PT, DPT 01/03/2020, 3:12 PM  [facility] [address] [place], [place], [address] Phone: [phone]   Fax:  [phone]  Name: Haley Trevino MRN: [id number] Date of Birth: 13-Apr-1941

## 2020-01-04 ENCOUNTER — Other Ambulatory Visit: Payer: Self-pay

## 2020-01-04 MED ORDER — DENOSUMAB 60 MG/ML ~~LOC~~ SOSY
60.0000 mg | PREFILLED_SYRINGE | Freq: Once | SUBCUTANEOUS | 0 refills | Status: AC
Start: 2020-01-04 — End: 2020-01-04

## 2020-01-04 NOTE — Telephone Encounter (Signed)
Spoke with patient concerning continuing Prolia and patient states that she wants to continue. Sent Prolia to [employer]. Patient has appointment with PCP on July 19th, Advised patient to pick up Prolia and bring to appointment and we would administer. Patient agreed and verbalized understanding

## 2020-01-10 ENCOUNTER — Other Ambulatory Visit: Payer: Self-pay

## 2020-01-10 ENCOUNTER — Encounter: Payer: Self-pay | Admitting: Physical Therapy

## 2020-01-10 ENCOUNTER — Telehealth: Payer: Self-pay | Admitting: Nurse Practitioner

## 2020-01-10 ENCOUNTER — Ambulatory Visit: Payer: Medicare Other | Attending: Family | Admitting: Physical Therapy

## 2020-01-10 ENCOUNTER — Other Ambulatory Visit: Payer: Self-pay | Admitting: Nurse Practitioner

## 2020-01-10 DIAGNOSIS — R293 Abnormal posture: Secondary | ICD-10-CM | POA: Insufficient documentation

## 2020-01-10 DIAGNOSIS — M546 Pain in thoracic spine: Secondary | ICD-10-CM

## 2020-01-10 MED ORDER — SULFAMETHOXAZOLE-TRIMETHOPRIM 800-160 MG PO TABS
1.0000 | ORAL_TABLET | Freq: Two times a day (BID) | ORAL | 0 refills | Status: DC
Start: 2020-01-10 — End: 2020-01-23

## 2020-01-10 NOTE — Telephone Encounter (Signed)
Will send in antibiotic for finger. If needs stronger pain meds- needs to be seen

## 2020-01-10 NOTE — Therapy (Addendum)
[facility] [place], [place], [address] Phone: [phone]   Fax:  [phone]  Physical Therapy Treatment PHYSICAL THERAPY DISCHARGE SUMMARY  Visits from Start of Care: 3  Current functional level related to goals / functional outcomes: See below   Remaining deficits: See goals   Education / Equipment: HEP Plan: Patient agrees to discharge.  Patient goals were not met. Patient is being discharged due to not returning since the last visit.  ?????      Patient Details  Name: Haley Trevino MRN: [id number] Date of Birth: 1940-11-16 Referring Provider (PT): Evelina Dun, FNP   Encounter Date: 01/10/2020   PT End of Session - 01/10/20 1305    Visit Number 3    Number of Visits 6    Date for PT Re-Evaluation 02/14/20    Authorization Type Progress note every 10th visit    PT Start Time 2080    PT Stop Time 1354    PT Time Calculation (min) 49 min    Activity Tolerance Patient tolerated treatment well    Behavior During Therapy [facility] for tasks assessed/performed           Past Medical History:  Diagnosis Date  . Cataract   . Dyspnea   . Osteoarthritis   . Raynaud disease   . Scleroderma ([place])   . Thyroid disease    hypothyroidism    Past Surgical History:  Procedure Laterality Date  . BREAST ENHANCEMENT SURGERY  1975  . BREAST IMPLANT REMOVAL Bilateral 04/23/2016   Procedure: REMOVALBILATERAL BREAST IMPLANTS;  Surgeon: Wallace Going, DO;  Location: [facility];  Service: Plastics;  Laterality: Bilateral;  . CHOLECYSTECTOMY  1973  . HAND SURGERY  08/2012; 11/2012  . IR RADIOLOGIST EVAL & MGMT  07/27/2017  . IR SACROPLASTY BILATERAL  07/31/2017  . MELANOMA EXCISION     at 76 yrs of age  . ORIF HIP FRACTURE Right 02/22/2014   Procedure: OPEN REDUCTION INTERNAL FIXATION RIGHT HIP;  Surgeon: Sanjuana Kava, MD;  Location: [facility];  Service: Orthopedics;  Laterality: Right;  . TOTAL  ABDOMINAL HYSTERECTOMY  1974    There were no vitals filed for this visit.   Subjective Assessment - 01/10/20 1303    Subjective COVID-19 screening performed upon arrival. Reports that she has had no improvement since PT but had a lot more shoulder pain following strengthening in last treatment.    Pertinent History HTN, Raynaud's Syndrome, Scleroderma, Osteoporosis, h/o R hip ORIF 2015, h/o hand surgery    Limitations Lifting;House hold activities    Diagnostic tests x-ray:Mild compression fracture T10 vertebral body of indeterminate age 30/2021 imaging; Subtle fracture of the anterior left seventh rib. 08/2019 imaging; see imaging tab    Patient Stated Goals decrease pain, improve movement    Currently in Pain? Yes    Pain Location Thoracic    Pain Orientation Right;Left    Pain Descriptors / Indicators Sharp;Shooting    Pain Type Chronic pain    Pain Radiating Towards anterior rib cage, anterior abdomen    Pain Onset More than a month ago    Pain Frequency Intermittent              [facility] PT Assessment - 01/10/20 0001      Assessment   Medical Diagnosis Acute right sided thoracic back pain    Referring Provider (PT) Evelina Dun, FNP    Hand Dominance Right    Next MD Visit 01/23/2020  Precautions   Precautions None      Restrictions   Weight Bearing Restrictions No                         [facility] Adult PT Treatment/Exercise - 01/10/20 0001      Modalities   Modalities Electrical Stimulation;Moist Heat      Moist Heat Therapy   Number Minutes Moist Heat 15 Minutes    Moist Heat Location Other (comment)   thoracic spine     Electrical Stimulation   Electrical Stimulation Location B mid thoracic paraspinals    Electrical Stimulation Action IFC    Electrical Stimulation Parameters 80-150 hz x15 min    Electrical Stimulation Goals Pain      Manual Therapy   Manual Therapy Soft tissue mobilization    Soft tissue mobilization STW/M to bilateral  thoracic paraspinals, rhomboid reigon and lower rib region to decrease tone and pain.                        PT Long Term Goals - 12/27/19 2121      PT LONG TERM GOAL #1   Title Patient will be independent with HEP    Time 6    Period Weeks    Status New      PT LONG TERM GOAL #2   Title Patient will report ability to perform ADLs and home activities with thoracic pain less than or equal to 5/10.    Time 6    Period Weeks    Status New      PT LONG TERM GOAL #3   Title Patient will report ability to dress and lift overhead with thoracic pain less than or equal to 5/10.    Time 6    Period Weeks    Status New      PT LONG TERM GOAL #4   Title ---      PT LONG TERM GOAL #5   Title ---                 Plan - 01/10/20 1400    Clinical Impression Statement Patient presented in clinic with reports of continued intense thoracic pain. Patient requested no therex today as she had more L shoulder pain after last treatment. Patient very tender to R mid rhomboid region. Normal modalities response noted following removal of the modalities.    Personal Factors and Comorbidities Comorbidity 3+    Comorbidities HTN, Raynaud's Syndrome, Scleroderma, Osteoporosis, h/o R hip ORIF 2015, h/o hand surgery    Examination-Activity Limitations Reach Overhead;Carry;Dressing    Examination-Participation Restrictions Cleaning    Stability/Clinical Decision Making Stable/Uncomplicated    Rehab Potential Fair    PT Frequency 1x / week    PT Duration 6 weeks    PT Treatment/Interventions ADLs/Self Care Home Management;Iontophoresis 62m/ml Dexamethasone;Moist Heat;Electrical Stimulation;Therapeutic activities;Therapeutic exercise;Neuromuscular re-education;Manual techniques;Passive range of motion;Patient/family education    PT Next Visit Plan postural exercises, UBE if able, STW/M to thoracic paraspinals and rhomboids to decrease pain.    PT Home Exercise Plan see patient education  section    Consulted and Agree with Plan of Care Patient           Patient will benefit from skilled therapeutic intervention in order to improve the following deficits and impairments:  Decreased activity tolerance, Decreased strength, Decreased range of motion, Impaired UE functional use, Pain, Postural dysfunction  Visit Diagnosis: Pain in thoracic spine  Abnormal posture     Problem List Patient Active Problem List   Diagnosis Date Noted  . Murmur 11/18/2017  . Impingement syndrome of left shoulder region 12/08/2014  . Osteoporosis 09/20/2014  . Hip fracture requiring operative repair ([place]) 02/22/2014  . Hypothyroidism 11/29/2013  . Depression 11/29/2013  . GERD (gastroesophageal reflux disease) 11/29/2013  . Hypertension 11/29/2013  . Hyperlipidemia with target LDL less than 100 11/29/2013  . Thyroid cyst 04/26/2013  . MALIGNANT MELANOMA, SKIN 08/07/2010  . [name] 08/06/2010  . SCLERODERMA 08/06/2010  . Osteoarthritis 08/06/2010    Standley Brooking, PTA 01/10/2020, 2:05 PM  [facility] [address] [place], [place], [address] Phone: [phone]   Fax:  [phone]  Name: EVADENE WARDRIP MRN: [id number] Date of Birth: 28-Jan-1941

## 2020-01-10 NOTE — Telephone Encounter (Signed)
  REFERRAL REQUEST Telephone Note 01/10/2020  What type of referral do you need? Hurt ribs and the pain is getting worse.   Why do you need this referral? In a lot of pain  Have you been seen at our office for this problem? yes (Advise that they may need an appointment with their PCP before a referral can be done)  Is there a particular doctor or location that you prefer? No sure  Patient notified that referrals can take up to a week or longer to process. If they haven't heard anything within a week they should call back and speak with the referral department.

## 2020-01-10 NOTE — Telephone Encounter (Signed)
Pt states she is in so much pain and it keeps getting worse-The Tramadol isn't helping-she is doing PT but only makes it worse. Is there anything else that could be done? Muscle relaxers hasn't helped. She is also c/o ulcer/infection on her finger and wants antibiotic and she says you usually send something in.

## 2020-01-10 NOTE — Progress Notes (Signed)
Bactrim DS called in

## 2020-01-10 NOTE — Telephone Encounter (Signed)
Pt aware and scheduled to see MMM for rib pain on 01/17/20 at 2.

## 2020-01-11 ENCOUNTER — Ambulatory Visit: Payer: Medicare Other | Admitting: *Deleted

## 2020-01-11 NOTE — Chronic Care Management (AMB) (Signed)
  Chronic Care Management   Initial Visit Outreach  01/11/2020 Name: Haley Trevino MRN: [id number] DOB: 14-Oct-1940  Referred by: [name], FNP Reason for referral : Chronic Care Management (RN Initial Visit)   An unsuccessful telephone outreach was attempted today. The patient was referred to the case management team for assistance with care management and care coordination.    RN Care Plan    . Chronic Disease Management Needs       CARE PLAN ENTRY (see longtitudinal plan of care for additional care plan information)  Current Barriers:  . Chronic Disease Management support, education, and care coordination needs related to OA, Depression,GERD, HTN, , HLD, [name],Hypothyroidism, Osteoporosis  Clinical Goals: . Over the next 30 days, patient will have an Initial CCM Visit with RN Care Manager to discuss self-management of their chronic medical conditions  Interventions: . Chart reviewed in preparation for initial visit telephone call . Collaboration with other care team members as needed . Unsuccessful outreach to patient  . A HIPPA compliant phone message was left for the patient providing contact information and requesting a return call.  . Request sent to care guides to reach out and reschedule patient's initial visit  Patient Self Care Activities: . Undetermined   Initial goal documentation         Follow Up Plan: A HIPPA compliant phone message was left for the patient providing contact information and requesting a return call.  The care management team will reach out to the patient again over the next 30 days.  Telephone follow up appointment with care management team member scheduled for: LCSW 02/02/20 Next PCP appointment scheduled for: 01/17/20 with [name], FNP  Chong Sicilian, BSN, RN-BC [facility] Management Direct Dial: [phone]

## 2020-01-17 ENCOUNTER — Other Ambulatory Visit: Payer: Self-pay

## 2020-01-17 ENCOUNTER — Ambulatory Visit: Payer: Medicare Other | Admitting: Physical Therapy

## 2020-01-17 ENCOUNTER — Encounter: Payer: Self-pay | Admitting: Nurse Practitioner

## 2020-01-17 ENCOUNTER — Ambulatory Visit (INDEPENDENT_AMBULATORY_CARE_PROVIDER_SITE_OTHER): Payer: Medicare Other | Admitting: Nurse Practitioner

## 2020-01-17 VITALS — BP 128/65 | HR 77 | Temp 99.1°F | Resp 20 | Ht 62.0 in | Wt 104.0 lb

## 2020-01-17 DIAGNOSIS — M546 Pain in thoracic spine: Secondary | ICD-10-CM | POA: Diagnosis not present

## 2020-01-17 MED ORDER — HYDROCODONE-ACETAMINOPHEN 5-325 MG PO TABS: 1.0000 | ORAL_TABLET | Freq: Four times a day (QID) | ORAL | 0 refills | Status: DC | PRN

## 2020-01-17 NOTE — Progress Notes (Signed)
Subjective:    Patient ID: Haley Trevino, female    DOB: 09/01/1940, 79 y.o.   MRN: [id number]   Chief Complaint: Pain in chest and shoulders   HPI Pain in bil shoulder blades and down through rib cage. She feels weak and tired. She was given ultram at last visit and has not helped. She rates pain 9/10. Xray were negative. She went to PT and that did not help at all and they said there was nothing they could do. She has been hurting since May.   Review of Systems  Constitutional: Negative for diaphoresis.  Eyes: Negative for pain.  Respiratory: Negative for shortness of breath.   Cardiovascular: Negative for chest pain, palpitations and leg swelling.  Gastrointestinal: Negative for abdominal pain.  Endocrine: Negative for polydipsia.  Skin: Negative for rash.  Neurological: Negative for dizziness, weakness and headaches.  Hematological: Does not bruise/bleed easily.  All other systems reviewed and are negative.      Objective:   Physical Exam Vitals and nursing note reviewed.  Constitutional:      General: She is not in acute distress.    Appearance: Normal appearance. She is well-developed.  HENT:     Head: Normocephalic.     Nose: Nose normal.  Eyes:     Pupils: Pupils are equal, round, and reactive to light.  Neck:     Vascular: No carotid bruit or JVD.  Cardiovascular:     Rate and Rhythm: Normal rate and regular rhythm.     Heart sounds: Normal heart sounds.  Pulmonary:     Effort: Pulmonary effort is normal. No respiratory distress.     Breath sounds: Normal breath sounds. No wheezing or rales.  Chest:     Chest wall: No tenderness.  Abdominal:     General: Bowel sounds are normal. There is no distension or abdominal bruit.     Palpations: Abdomen is soft. There is no hepatomegaly, splenomegaly, mass or pulsatile mass.     Tenderness: There is no abdominal tenderness.  Musculoskeletal:        General: Normal range of motion.     Cervical back: Normal  range of motion and neck supple.  Lymphadenopathy:     Cervical: No cervical adenopathy.  Skin:    General: Skin is warm and dry.  Neurological:     Mental Status: She is alert and oriented to person, place, and time.     Deep Tendon Reflexes: Reflexes are normal and symmetric.  Psychiatric:        Behavior: Behavior normal.        Thought Content: Thought content normal.        Judgment: Judgment normal.    BP 128/65   Pulse 77   Temp 99.1 F (37.3 C) (Temporal)   Resp 20   Ht 5\' 2"  (1.575 m)   Wt 104 lb (47.2 kg)   SpO2 95%   BMI 19.02 kg/m         Assessment & Plan:  Haley Trevino in today with chief complaint of Pain in chest and shoulders   1. Thoracic spine pain moist heat No heavy lifting - MR Thoracic Spine Wo Contrast; Future  Meds ordered this encounter  Medications  . HYDROcodone-acetaminophen (NORCO/VICODIN) 5-325 MG tablet    Sig: Take 1 tablet by mouth every 6 (six) hours as needed for moderate pain.    Dispense:  30 tablet    Refill:  0    Order Specific  Question:   Supervising Provider    Answer:   Worthy Rancher [[id number]]     The above assessment and management plan was discussed with the patient. The patient verbalized understanding of and has agreed to the management plan. Patient is aware to call the clinic if symptoms persist or worsen. Patient is aware when to return to the clinic for a follow-up visit. Patient educated on when it is appropriate to go to the emergency department.   Mary-Margaret Hassell Done, FNP

## 2020-01-17 NOTE — Patient Instructions (Signed)

## 2020-01-23 ENCOUNTER — Other Ambulatory Visit: Payer: Self-pay

## 2020-01-23 ENCOUNTER — Ambulatory Visit (INDEPENDENT_AMBULATORY_CARE_PROVIDER_SITE_OTHER): Payer: Medicare Other | Admitting: Nurse Practitioner

## 2020-01-23 ENCOUNTER — Encounter: Payer: Self-pay | Admitting: Nurse Practitioner

## 2020-01-23 VITALS — BP 146/66 | HR 74 | Temp 98.3°F | Resp 20 | Ht 62.0 in | Wt 104.0 lb

## 2020-01-23 DIAGNOSIS — I7301 Raynaud's syndrome with gangrene: Secondary | ICD-10-CM | POA: Diagnosis not present

## 2020-01-23 DIAGNOSIS — M349 Systemic sclerosis, unspecified: Secondary | ICD-10-CM

## 2020-01-23 DIAGNOSIS — E785 Hyperlipidemia, unspecified: Secondary | ICD-10-CM

## 2020-01-23 DIAGNOSIS — F3342 Major depressive disorder, recurrent, in full remission: Secondary | ICD-10-CM | POA: Diagnosis not present

## 2020-01-23 DIAGNOSIS — E039 Hypothyroidism, unspecified: Secondary | ICD-10-CM

## 2020-01-23 DIAGNOSIS — K219 Gastro-esophageal reflux disease without esophagitis: Secondary | ICD-10-CM | POA: Diagnosis not present

## 2020-01-23 DIAGNOSIS — I10 Essential (primary) hypertension: Secondary | ICD-10-CM | POA: Diagnosis not present

## 2020-01-23 MED ORDER — SYNTHROID 75 MCG PO TABS: 75.0000 ug | ORAL_TABLET | Freq: Every day | ORAL | 1 refills | Status: DC

## 2020-01-23 MED ORDER — NIFEDIPINE ER OSMOTIC RELEASE 60 MG PO TB24: 60.0000 mg | ORAL_TABLET | Freq: Every day | ORAL | 1 refills | Status: DC

## 2020-01-23 MED ORDER — FERROUS SULFATE 325 (65 FE) MG PO TABS: 325.0000 mg | ORAL_TABLET | Freq: Two times a day (BID) | ORAL | 1 refills | Status: DC

## 2020-01-23 MED ORDER — SILDENAFIL CITRATE 20 MG PO TABS: 20.0000 mg | ORAL_TABLET | Freq: Three times a day (TID) | ORAL | 1 refills | Status: DC

## 2020-01-23 MED ORDER — FLUOXETINE HCL 40 MG PO CAPS: 40.0000 mg | ORAL_CAPSULE | Freq: Every day | ORAL | 3 refills | Status: DC

## 2020-01-23 MED ORDER — OMEPRAZOLE 20 MG PO CPDR: DELAYED_RELEASE_CAPSULE | ORAL | 1 refills | Status: DC

## 2020-01-23 NOTE — Addendum Note (Signed)
Addended by: [name] on: 01/23/2020 02:46 PM   Modules accepted: Orders

## 2020-01-23 NOTE — Patient Instructions (Signed)
Stress, Adult Stress is a normal reaction to life events. Stress is what you feel when life demands more than you are used to, or more than you think you can handle. Some stress can be useful, such as studying for a test or meeting a deadline at work. Stress that occurs too often or for too long can cause problems. It can affect your emotional health and interfere with relationships and normal daily activities. Too much stress can weaken your body's defense system (immune system) and increase your risk for physical illness. If you already have a medical problem, stress can make it worse. What are the causes? All sorts of life events can cause stress. An event that causes stress for one person may not be stressful for another person. Major life events, whether positive or negative, commonly cause stress. Examples include:  Losing a job or starting a new job.  Losing a loved one.  Moving to a new town or home.  Getting married or divorced.  Having a baby.  Getting injured or sick. Less obvious life events can also cause stress, especially if they occur day after day or in combination with each other. Examples include:  Working long hours.  Driving in traffic.  Caring for children.  Being in debt.  Being in a difficult relationship. What are the signs or symptoms? Stress can cause emotional symptoms, including:  Anxiety. This is feeling worried, afraid, on edge, overwhelmed, or out of control.  Anger, including irritation or impatience.  Depression. This is feeling sad, down, helpless, or guilty.  Trouble focusing, remembering, or making decisions. Stress can cause physical symptoms, including:  Aches and pains. These may affect your head, neck, back, stomach, or other areas of your body.  Tight muscles or a clenched jaw.  Low energy.  Trouble sleeping. Stress can cause unhealthy behaviors, including:  Eating to feel better (overeating) or skipping meals.  Working too  much or putting off tasks.  Smoking, drinking alcohol, or using drugs to feel better. How is this diagnosed? Stress is diagnosed through an assessment by your health care provider. He or she may diagnose this condition based on:  Your symptoms and any stressful life events.  Your medical history.  Tests to rule out other causes of your symptoms. Depending on your condition, your health care provider may refer you to a specialist for further evaluation. How is this treated?  Stress management techniques are the recommended treatment for stress. Medicine is not typically recommended for the treatment of stress. Techniques to reduce your reaction to stressful life events include:  Stress identification. Monitor yourself for symptoms of stress and identify what causes stress for you. These skills may help you to avoid or prepare for stressful events.  Time management. Set your priorities, keep a calendar of events, and learn to say no. Taking these actions can help you avoid making too many commitments. Techniques for coping with stress include:  Rethinking the problem. Try to think realistically about stressful events rather than ignoring them or overreacting. Try to find the positives in a stressful situation rather than focusing on the negatives.  Exercise. Physical exercise can release both physical and emotional tension. The key is to find a form of exercise that you enjoy and do it regularly.  Relaxation techniques. These relax the body and mind. The key is to find one or more that you enjoy and use the techniques regularly. Examples include: ? Meditation, deep breathing, or progressive relaxation techniques. ? Yoga or   tai chi. ? Biofeedback, mindfulness techniques, or journaling. ? Listening to music, being out in nature, or participating in other hobbies.  Practicing a healthy lifestyle. Eat a balanced diet, drink plenty of water, limit or avoid caffeine, and get plenty of  sleep.  Having a strong support network. Spend time with family, friends, or other people you enjoy being around. Express your feelings and talk things over with someone you trust. Counseling or talk therapy with a mental health professional may be helpful if you are having trouble managing stress on your own. Follow these instructions at home: Lifestyle   Avoid drugs.  Do not use any products that contain nicotine or tobacco, such as cigarettes, e-cigarettes, and chewing tobacco. If you need help quitting, ask your health care provider.  Limit alcohol intake to no more than 1 drink a day for nonpregnant women and 2 drinks a day for men. One drink equals 12 oz of beer, 5 oz of wine, or 1 oz of hard liquor  Do not use alcohol or drugs to relax.  Eat a balanced diet that includes fresh fruits and vegetables, whole grains, lean meats, fish, eggs, and beans, and low-fat dairy. Avoid processed foods and foods high in added fat, sugar, and salt.  Exercise at least 30 minutes on 5 or more days each week.  Get 7-8 hours of sleep each night. General instructions   Practice stress management techniques as discussed with your health care provider.  Drink enough fluid to keep your urine clear or pale yellow.  Take over-the-counter and prescription medicines only as told by your health care provider.  Keep all follow-up visits as told by your health care provider. This is important. Contact a health care provider if:  Your symptoms get worse.  You have new symptoms.  You feel overwhelmed by your problems and can no longer manage them on your own. Get help right away if:  You have thoughts of hurting yourself or others. If you ever feel like you may hurt yourself or others, or have thoughts about taking your own life, get help right away. You can go to your nearest emergency department or call:  Your local emergency services (911 in the [place]  A suicide crisis helpline, such as the  [place] at (316) 250-6172. This is open 24 hours a day. Summary  Stress is a normal reaction to life events. It can cause problems if it happens too often or for too long.  Practicing stress management techniques is the best way to treat stress.  Counseling or talk therapy with a mental health professional may be helpful if you are having trouble managing stress on your own. This information is not intended to replace advice given to you by your health care provider. Make sure you discuss any questions you have with your health care provider. Document Revised: 01/21/2019 Document Reviewed: 08/13/2016 Elsevier Patient Education  [place].

## 2020-01-23 NOTE — Progress Notes (Signed)
Subjective:    Patient ID: Haley Trevino, female    DOB: 05/15/1941, 79 y.o.   MRN: [id number]   Chief Complaint: Medical Management of Chronic Issues    HPI:  1. Essential hypertension No c/o chest pain, sob or headache. She does not check blood pressure at home. BP Readings from Last 3 Encounters:  01/23/20 (!) 146/6  01/17/20 128/65  12/20/19 139/64     2. Hyperlipidemia with target LDL less than 100 Does try to watch diet but is not ab;e to do any exercise. Lab Results  Component Value Date   CHOL 182 10/20/2019   HDL 46 10/20/2019   LDLCALC 102 (H) 10/20/2019   TRIG 199 (H) 10/20/2019   CHOLHDL 4.0 10/20/2019     3. Acquired hypothyroidism No problems that aware of  4. Gastroesophageal reflux disease without esophagitis Is on omeprazole daily and is doing well.  5. SCLERODERMA Sees Dr, Amil Amen every few months. She is not doing very well. C/o lots of pain, but is not sure if it is coming from her sclerderma or not.  6. depression She use to be on klonopin and prozac, but felt she was doing well so she stopped taking it. Says she has ben having hard with her health and her husbands health that needs to go back on something. Because eof pain meds the only option she has is just prozac.  Depression screen [facility] 2/9 01/23/2020 12/20/2019 12/02/2019  Decreased Interest 0 0 0  Down, Depressed, Hopeless 0 0 1  PHQ - 2 Score 0 0 1  Altered sleeping - - 2  Tired, decreased energy - - 1  Change in appetite - - 1  Feeling bad or failure about yourself  - - 0  Trouble concentrating - - 0  Moving slowly or fidgety/restless - - 0  Suicidal thoughts - - 0  PHQ-9 Score - - 5  Difficult doing work/chores - - Somewhat difficult  Some recent data might be hidden     Outpatient Encounter Medications as of 01/23/2020  Medication Sig  . baclofen (LIORESAL) 10 MG tablet Take 0.5 tablets (5 mg total) by mouth 3 (three) times daily.  . Cholecalciferol (VITAMIN D3) 2000  units TABS Take 2,000 Units by mouth daily.  . diclofenac (VOLTAREN) 75 MG EC tablet Take 1 tablet (75 mg total) by mouth 2 (two) times daily.  . ferrous sulfate 325 (65 FE) MG tablet Take 1 tablet (325 mg total) by mouth 2 (two) times daily with a meal.  . HYDROcodone-acetaminophen (NORCO/VICODIN) 5-325 MG tablet Take 1 tablet by mouth every 6 (six) hours as needed for moderate pain.  . meclizine (ANTIVERT) 25 MG tablet Take 1 tablet (25 mg total) by mouth 3 (three) times daily as needed for dizziness.  Marland Kitchen NIFEdipine (PROCARDIA XL/NIFEDICAL XL) 60 MG 24 hr tablet Take 1 tablet (60 mg total) by mouth daily.  Marland Kitchen omeprazole (PRILOSEC) 20 MG capsule TAKE 1 TABLET DAILY  . sildenafil (REVATIO) 20 MG tablet Take 1 tablet (20 mg total) by mouth 3 (three) times daily.  Marland Kitchen SYNTHROID 75 MCG tablet Take 1 tablet (75 mcg total) by mouth daily before breakfast.     Past Surgical History:  Procedure Laterality Date  . BREAST ENHANCEMENT SURGERY  1975  . BREAST IMPLANT REMOVAL Bilateral 04/23/2016   Procedure: REMOVALBILATERAL BREAST IMPLANTS;  Surgeon: Wallace Going, DO;  Location: [place];  Service: Plastics;  Laterality: Bilateral;  . CHOLECYSTECTOMY  1973  .  HAND SURGERY  08/2012; 11/2012  . IR RADIOLOGIST EVAL & MGMT  07/27/2017  . IR SACROPLASTY BILATERAL  07/31/2017  . MELANOMA EXCISION     at 41 yrs of age  . ORIF HIP FRACTURE Right 02/22/2014   Procedure: OPEN REDUCTION INTERNAL FIXATION RIGHT HIP;  Surgeon: Sanjuana Kava, MD;  Location: [facility];  Service: Orthopedics;  Laterality: Right;  . TOTAL ABDOMINAL HYSTERECTOMY  1974    Family History  Problem Relation Age of Onset  . Pancreatic cancer Father   . Raynaud syndrome Father   . Cancer Father        pancreatic  . Rashes / Skin problems Daughter        possibly scleraderma  . Hip fracture Mother   . Mental illness Mother        attempted suicide at 71 yo    New complaints: none  Social history: Lives with her  husband who had a stroke and has lots of problems with his memeory  Controlled substance contract: 01/23/20    Review of Systems  Constitutional: Negative for diaphoresis.  Eyes: Negative for pain.  Respiratory: Negative for shortness of breath.   Cardiovascular: Negative for chest pain, palpitations and leg swelling.  Gastrointestinal: Negative for abdominal pain.  Endocrine: Negative for polydipsia.  Skin: Negative for rash.  Neurological: Negative for dizziness, weakness and headaches.  Hematological: Does not bruise/bleed easily.  All other systems reviewed and are negative.      Objective:   Physical Exam Vitals and nursing note reviewed.  Constitutional:      General: She is not in acute distress.    Appearance: Normal appearance. She is well-developed.  HENT:     Head: Normocephalic.     Nose: Nose normal.  Eyes:     Pupils: Pupils are equal, round, and reactive to light.  Neck:     Vascular: No carotid bruit or JVD.  Cardiovascular:     Rate and Rhythm: Normal rate and regular rhythm.     Heart sounds: Normal heart sounds.  Pulmonary:     Effort: Pulmonary effort is normal. No respiratory distress.     Breath sounds: Normal breath sounds. No wheezing or rales.  Chest:     Chest wall: No tenderness.  Abdominal:     General: Bowel sounds are normal. There is no distension or abdominal bruit.     Palpations: Abdomen is soft. There is no hepatomegaly, splenomegaly, mass or pulsatile mass.     Tenderness: There is no abdominal tenderness.  Musculoskeletal:        General: Normal range of motion.     Cervical back: Normal range of motion and neck supple.  Lymphadenopathy:     Cervical: No cervical adenopathy.  Skin:    General: Skin is warm and dry.  Neurological:     Mental Status: She is alert and oriented to person, place, and time.     Deep Tendon Reflexes: Reflexes are normal and symmetric.  Psychiatric:        Behavior: Behavior normal.        Thought  Content: Thought content normal.        Judgment: Judgment normal.    BP (!) 146/66   Pulse 74   Temp 98.3 F (36.8 C) (Temporal)   Resp 20   Ht 5\' 2"  (1.575 m)   Wt 104 lb (47.2 kg)   SpO2 96%   BMI 19.02 kg/m  Assessment & Plan:  Haley Trevino comes in today with chief complaint of Medical Management of Chronic Issues   Diagnosis and orders addressed:  1. Essential hypertension Low sodium diet  2. Hyperlipidemia with target LDL less than 100 Low fat diet  3. Acquired hypothyroidism Labs pending - SYNTHROID 75 MCG tablet; Take 1 tablet (75 mcg total) by mouth daily before breakfast.  Dispense: 90 tablet; Refill: 1   4. Gastroesophageal reflux disease without esophagitis Avoid spicy foods Do not eat 2 hours prior to bedtime - omeprazole (PRILOSEC) 20 MG capsule; TAKE 1 TABLET DAILY  Dispense: 90 capsule; Refill: 1  5. SCLERODERMA Keep follow up with Dr. Amil Amen  6. Recurrent major depressive disorder, in full remission ([place]) Stress management - FLUoxetine (PROZAC) 40 MG capsule; Take 1 capsule (40 mg total) by mouth daily.  Dispense: 90 capsule; Refill: 3  8. [name] with gangrene ([place]) Keep fingers warm - sildenafil (REVATIO) 20 MG tablet; Take 1 tablet (20 mg total) by mouth 3 (three) times daily.  Dispense: 90 tablet; Refill: 1 - NIFEdipine (PROCARDIA XL/NIFEDICAL XL) 60 MG 24 hr tablet; Take 1 tablet (60 mg total) by mouth daily.  Dispense: 90 tablet; Refill: 1   Labs pending Health Maintenance reviewed Diet and exercise encouraged  Follow up plan: 6 months   Mary-Margaret Hassell Done, FNP

## 2020-01-24 LAB — LIPID PANEL
Chol/HDL Ratio: 4.5 ratio — ABNORMAL HIGH (ref 0.0–4.4)
Cholesterol, Total: 189 mg/dL (ref 100–199)
HDL: 42 mg/dL (ref >39)
LDL Chol Calc (NIH): 119 mg/dL — ABNORMAL HIGH (ref 0–99)
Triglycerides: 158 mg/dL — ABNORMAL HIGH (ref 0–149)
VLDL Cholesterol Cal: 28 mg/dL (ref 5–40)

## 2020-01-24 LAB — CMP14+EGFR
ALT: 6 IU/L (ref 0–32)
AST: 12 IU/L (ref 0–40)
Albumin/Globulin Ratio: 1.7 (ref 1.2–2.2)
Albumin: 4.3 g/dL (ref 3.7–4.7)
Alkaline Phosphatase: 66 IU/L (ref 48–121)
BUN/Creatinine Ratio: 20 (ref 12–28)
BUN: 17 mg/dL (ref 8–27)
Bilirubin Total: <0.2 mg/dL (ref 0.0–1.2)
CO2: 21 mmol/L (ref 20–29)
Calcium: 9.1 mg/dL (ref 8.7–10.3)
Chloride: 106 mmol/L (ref 96–106)
Creatinine, Ser: 0.87 mg/dL (ref 0.57–1.00)
GFR calc Af Amer: 74 mL/min/{1.73_m2} (ref >59)
GFR calc non Af Amer: 64 mL/min/{1.73_m2} (ref >59)
Globulin, Total: 2.5 g/dL (ref 1.5–4.5)
Glucose: 85 mg/dL (ref 65–99)
Potassium: 4.1 mmol/L (ref 3.5–5.2)
Sodium: 138 mmol/L (ref 134–144)
Total Protein: 6.8 g/dL (ref 6.0–8.5)

## 2020-01-24 LAB — CBC WITH DIFFERENTIAL/PLATELET
Basophils Absolute: 0.1 10*3/uL (ref 0.0–0.2)
Basos: 1 %
EOS (ABSOLUTE): 0.1 10*3/uL (ref 0.0–0.4)
Eos: 2 %
Hematocrit: 31.3 % — ABNORMAL LOW (ref 34.0–46.6)
Hemoglobin: 9.2 g/dL — ABNORMAL LOW (ref 11.1–15.9)
Immature Grans (Abs): 0 10*3/uL (ref 0.0–0.1)
Immature Granulocytes: 0 %
Lymphocytes Absolute: 1.4 10*3/uL (ref 0.7–3.1)
Lymphs: 24 %
MCH: 26.8 pg (ref 26.6–33.0)
MCHC: 29.4 g/dL — ABNORMAL LOW (ref 31.5–35.7)
MCV: 91 fL (ref 79–97)
Monocytes Absolute: 0.4 10*3/uL (ref 0.1–0.9)
Monocytes: 7 %
Neutrophils Absolute: 3.8 10*3/uL (ref 1.4–7.0)
Neutrophils: 66 %
Platelets: 268 10*3/uL (ref 150–450)
RBC: 3.43 x10E6/uL — ABNORMAL LOW (ref 3.77–5.28)
RDW: 14.2 % (ref 11.7–15.4)
WBC: 5.8 10*3/uL (ref 3.4–10.8)

## 2020-01-24 LAB — THYROID PANEL WITH TSH
Free Thyroxine Index: 2.3 (ref 1.2–4.9)
T3 Uptake Ratio: 25 % (ref 24–39)
T4, Total: 9 ug/dL (ref 4.5–12.0)
TSH: 1.29 u[IU]/mL (ref 0.450–4.500)

## 2020-01-25 ENCOUNTER — Other Ambulatory Visit: Payer: Self-pay

## 2020-01-25 ENCOUNTER — Ambulatory Visit (INDEPENDENT_AMBULATORY_CARE_PROVIDER_SITE_OTHER): Payer: Medicare Other | Admitting: *Deleted

## 2020-01-25 DIAGNOSIS — M810 Age-related osteoporosis without current pathological fracture: Secondary | ICD-10-CM

## 2020-01-25 DIAGNOSIS — M159 Polyosteoarthritis, unspecified: Secondary | ICD-10-CM

## 2020-01-25 MED ORDER — DENOSUMAB 60 MG/ML ~~LOC~~ SOSY
60.0000 mg | PREFILLED_SYRINGE | Freq: Once | SUBCUTANEOUS | Status: AC
  Administered 2020-01-25: 60 mg via SUBCUTANEOUS

## 2020-01-25 NOTE — Progress Notes (Signed)
Prolia Injection given and tolerated well ° °

## 2020-01-30 ENCOUNTER — Emergency Department (HOSPITAL_COMMUNITY): Payer: Medicare Other

## 2020-01-30 ENCOUNTER — Emergency Department (HOSPITAL_COMMUNITY)
Admission: EM | Admit: 2020-01-30 | Discharge: 2020-01-30 | Disposition: A | Discharge status: Home / Self Care | Payer: Medicare Other | Attending: Emergency Medicine | Admitting: Emergency Medicine

## 2020-01-30 ENCOUNTER — Encounter (HOSPITAL_COMMUNITY): Payer: Self-pay | Admitting: *Deleted

## 2020-01-30 ENCOUNTER — Other Ambulatory Visit: Payer: Self-pay

## 2020-01-30 DIAGNOSIS — R079 Chest pain, unspecified: Secondary | ICD-10-CM | POA: Diagnosis not present

## 2020-01-30 DIAGNOSIS — Z79899 Other long term (current) drug therapy: Secondary | ICD-10-CM | POA: Diagnosis not present

## 2020-01-30 DIAGNOSIS — E039 Hypothyroidism, unspecified: Secondary | ICD-10-CM | POA: Insufficient documentation

## 2020-01-30 DIAGNOSIS — I10 Essential (primary) hypertension: Secondary | ICD-10-CM | POA: Insufficient documentation

## 2020-01-30 DIAGNOSIS — R1310 Dysphagia, unspecified: Secondary | ICD-10-CM

## 2020-01-30 DIAGNOSIS — Z79890 Hormone replacement therapy (postmenopausal): Secondary | ICD-10-CM | POA: Diagnosis not present

## 2020-01-30 DIAGNOSIS — D649 Anemia, unspecified: Secondary | ICD-10-CM | POA: Insufficient documentation

## 2020-01-30 LAB — BASIC METABOLIC PANEL
Anion gap: 8 (ref 5–15)
BUN: 13 mg/dL (ref 8–23)
CO2: 23 mmol/L (ref 22–32)
Calcium: 8.1 mg/dL — ABNORMAL LOW (ref 8.9–10.3)
Chloride: 108 mmol/L (ref 98–111)
Creatinine, Ser: 0.61 mg/dL (ref 0.44–1.00)
GFR calc Af Amer: >60 mL/min (ref >60)
GFR calc non Af Amer: >60 mL/min (ref >60)
Glucose, Bld: 108 mg/dL — ABNORMAL HIGH (ref 70–99)
Potassium: 3.7 mmol/L (ref 3.5–5.1)
Sodium: 139 mmol/L (ref 135–145)

## 2020-01-30 LAB — CBC
HCT: 29.9 % — ABNORMAL LOW (ref 36.0–46.0)
Hemoglobin: 10.2 g/dL — ABNORMAL LOW (ref 12.0–15.0)
MCH: 31.2 pg (ref 26.0–34.0)
MCHC: 34.1 g/dL (ref 30.0–36.0)
MCV: 91.4 fL (ref 80.0–100.0)
Platelets: 292 10*3/uL (ref 150–400)
RBC: 3.27 MIL/uL — ABNORMAL LOW (ref 3.87–5.11)
RDW: 15.9 % — ABNORMAL HIGH (ref 11.5–15.5)
WBC: 8.2 10*3/uL (ref 4.0–10.5)
nRBC: 0 % (ref 0.0–0.2)

## 2020-01-30 LAB — TROPONIN I (HIGH SENSITIVITY)
Troponin I (High Sensitivity): 8 ng/L (ref <18)
Troponin I (High Sensitivity): 9 ng/L (ref <18)

## 2020-01-30 MED ORDER — SODIUM CHLORIDE 0.9% FLUSH: 3.0000 mL | Freq: Once | INTRAVENOUS | Status: DC

## 2020-01-30 NOTE — Discharge Instructions (Addendum)
Please talk to [name] regarding who you have been referred to for gastroenterology.  You may see Dr. Watt Climes if you wish, I have given you his phone number above  If you should develop severe or worsening chest pain return to the emergency department immediately

## 2020-01-30 NOTE — ED Notes (Signed)
Pt ambulatory to waiting room. Pt verbalized understanding of discharge instructions.   

## 2020-01-30 NOTE — ED Triage Notes (Signed)
Pt c/o chest pain that radiates to her throat and neck x 3 days

## 2020-01-30 NOTE — ED Provider Notes (Signed)
[facility] EMERGENCY DEPARTMENT Provider Note   CSN: [id number] Arrival date & time: 01/30/20  1428     History Chief Complaint  Patient presents with  . Chest Pain    Haley Trevino is a 79 y.o. female.  HPI   This patient is a 79 year old female, she has a history of Raynaud's and scleroderma, she presents with a complaint of difficulty swallowing her pills feeling like her pills are getting caught in her throat.  She has had a heaviness on her chest for a couple of weeks like an elephant.  She has no exertional symptoms and this chest discomfort does not come on or go away with any certain actions.  She has no shortness of breath with it, no swelling of the legs, no fevers or chills.  She reports that she has been referred to gastroenterology by her family doctor, she has seen Dr. Watt Climes in the past.  At this time the patient is not having any symptoms at all.  She was recently told that she had low hemoglobin at 9.2, this is close to her baseline per my review of the medical record.  She has doubled her iron since then and has had a bit of an upset stomach  Past Medical History:  Diagnosis Date  . Cataract   . Dyspnea   . Osteoarthritis   . Raynaud disease   . Scleroderma ([place])   . Thyroid disease    hypothyroidism    Patient Active Problem List   Diagnosis Date Noted  . Murmur 11/18/2017  . Impingement syndrome of left shoulder region 12/08/2014  . Osteoporosis 09/20/2014  . Hip fracture requiring operative repair ([place]) 02/22/2014  . Hypothyroidism 11/29/2013  . Depression 11/29/2013  . GERD (gastroesophageal reflux disease) 11/29/2013  . Hypertension 11/29/2013  . Hyperlipidemia with target LDL less than 100 11/29/2013  . Thyroid cyst 04/26/2013  . MALIGNANT MELANOMA, SKIN 08/07/2010  . [name] 08/06/2010  . SCLERODERMA 08/06/2010  . Osteoarthritis 08/06/2010    Past Surgical History:  Procedure Laterality Date  . BREAST ENHANCEMENT SURGERY   1975  . BREAST IMPLANT REMOVAL Bilateral 04/23/2016   Procedure: REMOVALBILATERAL BREAST IMPLANTS;  Surgeon: Wallace Going, DO;  Location: [place];  Service: Plastics;  Laterality: Bilateral;  . CHOLECYSTECTOMY  1973  . HAND SURGERY  08/2012; 11/2012  . IR RADIOLOGIST EVAL & MGMT  07/27/2017  . IR SACROPLASTY BILATERAL  07/31/2017  . MELANOMA EXCISION     at 55 yrs of age  . ORIF HIP FRACTURE Right 02/22/2014   Procedure: OPEN REDUCTION INTERNAL FIXATION RIGHT HIP;  Surgeon: Sanjuana Kava, MD;  Location: [facility];  Service: Orthopedics;  Laterality: Right;  . TOTAL ABDOMINAL HYSTERECTOMY  1974     OB History    Gravida  3   Para  2   Term      Preterm      AB  1   Living  1     SAB  1   TAB      Ectopic      Multiple      Live Births              Family History  Problem Relation Age of Onset  . Pancreatic cancer Father   . Raynaud syndrome Father   . Cancer Father        pancreatic  . Rashes / Skin problems Daughter        possibly scleraderma  .  Hip fracture Mother   . Mental illness Mother        attempted suicide at 23 yo    Social History   Tobacco Use  . Smoking status: Never Smoker  . Smokeless tobacco: Never Used  . Tobacco comment: passive tobacco smoke exposure as child  Vaping Use  . Vaping Use: Never used  Substance Use Topics  . Alcohol use: No  . Drug use: No    Home Medications Prior to Admission medications   Medication Sig Start Date End Date Taking? Authorizing Provider  baclofen (LIORESAL) 10 MG tablet Take 0.5 tablets (5 mg total) by mouth 3 (three) times daily. 12/02/19   Evelina Dun A, FNP  Cholecalciferol (VITAMIN D3) 2000 units TABS Take 2,000 Units by mouth daily.    [provider]  diclofenac (VOLTAREN) 75 MG EC tablet Take 1 tablet (75 mg total) by mouth 2 (two) times daily. 11/17/19   Hassell Done Mary-Margaret, FNP  ferrous sulfate 325 (65 FE) MG tablet Take 1 tablet (325 mg total) by mouth 2  (two) times daily with a meal. 01/23/20   Hassell Done, Mary-Margaret, FNP  FLUoxetine (PROZAC) 40 MG capsule Take 1 capsule (40 mg total) by mouth daily. 01/23/20   Hassell Done Mary-Margaret, FNP  HYDROcodone-acetaminophen (NORCO/VICODIN) 5-325 MG tablet Take 1 tablet by mouth every 6 (six) hours as needed for moderate pain. 01/17/20   Hassell Done, Mary-Margaret, FNP  meclizine (ANTIVERT) 25 MG tablet Take 1 tablet (25 mg total) by mouth 3 (three) times daily as needed for dizziness. 01/21/19   Sharion Balloon, FNP  NIFEdipine (PROCARDIA XL/NIFEDICAL XL) 60 MG 24 hr tablet Take 1 tablet (60 mg total) by mouth daily. 01/23/20   Hassell Done Mary-Margaret, FNP  omeprazole (PRILOSEC) 20 MG capsule TAKE 1 TABLET DAILY 01/23/20   Hassell Done, Mary-Margaret, FNP  sildenafil (REVATIO) 20 MG tablet Take 1 tablet (20 mg total) by mouth 3 (three) times daily. 01/23/20   [name], FNP  SYNTHROID 75 MCG tablet Take 1 tablet (75 mcg total) by mouth daily before breakfast. 01/23/20   [name], FNP    Allergies    Dilaudid [hydromorphone hcl], Hydromorphone, Acyclovir and related, Crestor [rosuvastatin calcium], Gabapentin, Aleve [naproxen sodium], and Lyrica [pregabalin]  Review of Systems   Review of Systems  All other systems reviewed and are negative.   Physical Exam Updated Vital Signs BP (!) 173/73 (BP Location: Right Arm)   Pulse 80   Temp 98.4 F (36.9 C) (Oral)   Resp 17   Ht 1.575 m (5\' 2" )   Wt 47.2 kg   SpO2 100%   BMI 19.02 kg/m   Physical Exam Vitals and nursing note reviewed.  Constitutional:      General: She is not in acute distress.    Appearance: She is well-developed.  HENT:     Head: Normocephalic and atraumatic.     Mouth/Throat:     Pharynx: No oropharyngeal exudate.  Eyes:     General: No scleral icterus.       Right eye: No discharge.        Left eye: No discharge.     Conjunctiva/sclera: Conjunctivae normal.     Pupils: Pupils are equal, round, and reactive to  light.  Neck:     Thyroid: No thyromegaly.     Vascular: No JVD.  Cardiovascular:     Rate and Rhythm: Normal rate and regular rhythm.     Heart sounds: Murmur heard.  Systolic murmur is present with a grade  of 1/6.  No friction rub. No gallop.   Pulmonary:     Effort: Pulmonary effort is normal. No respiratory distress.     Breath sounds: Normal breath sounds. No wheezing or rales.  Abdominal:     General: Bowel sounds are normal. There is no distension.     Palpations: Abdomen is soft. There is no mass.     Tenderness: There is no abdominal tenderness.  Musculoskeletal:        General: No tenderness. Normal range of motion.     Cervical back: Normal range of motion and neck supple.  Lymphadenopathy:     Cervical: No cervical adenopathy.  Skin:    General: Skin is warm and dry.     Findings: No erythema or rash.  Neurological:     Mental Status: She is alert.     Coordination: Coordination normal.  Psychiatric:        Behavior: Behavior normal.     ED Results / Procedures / Treatments   Labs (all labs ordered are listed, but only abnormal results are displayed) Labs Reviewed  BASIC METABOLIC PANEL - Abnormal; Notable for the following components:      Result Value   Glucose, Bld 108 (*)    Calcium 8.1 (*)    All other components within normal limits  CBC - Abnormal; Notable for the following components:   RBC 3.27 (*)    Hemoglobin 10.2 (*)    HCT 29.9 (*)    RDW 15.9 (*)    All other components within normal limits  TROPONIN I (HIGH SENSITIVITY)  TROPONIN I (HIGH SENSITIVITY)    EKG None  Radiology DG Chest 2 View  Result Date: 01/30/2020 CLINICAL DATA:  Chest pain EXAM: CHEST - 2 VIEW COMPARISON:  December 20, 2019 FINDINGS: The cardiomediastinal silhouette is unchanged in contour.Tortuous thoracic aorta, unchanged. Atherosclerotic calcifications of the aorta. No pleural effusion. No pneumothorax. No acute pleuroparenchymal abnormality. Visualized abdomen is  unremarkable. Unchanged compression fracture deformities of the midthoracic spine since most recent prior. Degenerative changes at the thoracolumbar junction. IMPRESSION: 1. No acute cardiopulmonary abnormality. 2. Unchanged compression fracture deformities of the midthoracic spine. Electronically Signed   By: Valentino Saxon MD   On: 01/30/2020 15:17    Procedures Procedures (including critical care time)  Medications Ordered in ED Medications  sodium chloride flush (NS) 0.9 % injection 3 mL (has no administration in time range)    ED Course  I have reviewed the triage vital signs and the nursing notes.  Pertinent labs & imaging results that were available during my care of the patient were reviewed by me and considered in my medical decision making (see chart for details).    MDM Rules/Calculators/A&P                          This patient appears mildly anxious, she has some shaking her hands, her heart and lung exams are unremarkable, she has a known murmur which is very soft.  Vital signs suggest mild hypertension, 2 - troponins have been ordered and received and if but not had any change.  Her EKG is totally normal.  The patient has referral to GI by her family doctor, at this time I think she can go home with liquid diet as needed, she does not have any solid food dysphagia, stable, agreeable  Final Clinical Impression(s) / ED Diagnoses Final diagnoses:  Dysphagia, unspecified type  Anemia, unspecified type  Rx / DC Orders ED Discharge Orders    None       Noemi Chapel, MD 01/30/20 2101

## 2020-01-31 ENCOUNTER — Other Ambulatory Visit: Payer: Self-pay | Admitting: Nurse Practitioner

## 2020-01-31 DIAGNOSIS — D619 Aplastic anemia, unspecified: Secondary | ICD-10-CM

## 2020-01-31 NOTE — Progress Notes (Signed)
Ref GI

## 2020-02-02 ENCOUNTER — Ambulatory Visit (INDEPENDENT_AMBULATORY_CARE_PROVIDER_SITE_OTHER): Payer: Medicare Other | Admitting: Licensed Clinical Social Worker

## 2020-02-02 ENCOUNTER — Telehealth: Payer: Self-pay | Admitting: *Deleted

## 2020-02-02 ENCOUNTER — Ambulatory Visit: Payer: Medicare Other | Admitting: *Deleted

## 2020-02-02 DIAGNOSIS — E785 Hyperlipidemia, unspecified: Secondary | ICD-10-CM | POA: Diagnosis not present

## 2020-02-02 DIAGNOSIS — I10 Essential (primary) hypertension: Secondary | ICD-10-CM

## 2020-02-02 DIAGNOSIS — M810 Age-related osteoporosis without current pathological fracture: Secondary | ICD-10-CM

## 2020-02-02 DIAGNOSIS — M159 Polyosteoarthritis, unspecified: Secondary | ICD-10-CM

## 2020-02-02 DIAGNOSIS — I7301 Raynaud's syndrome with gangrene: Secondary | ICD-10-CM

## 2020-02-02 DIAGNOSIS — K219 Gastro-esophageal reflux disease without esophagitis: Secondary | ICD-10-CM

## 2020-02-02 DIAGNOSIS — F3342 Major depressive disorder, recurrent, in full remission: Secondary | ICD-10-CM | POA: Diagnosis not present

## 2020-02-02 DIAGNOSIS — E039 Hypothyroidism, unspecified: Secondary | ICD-10-CM | POA: Diagnosis not present

## 2020-02-02 MED ORDER — EZETIMIBE 10 MG PO TABS
10.0000 mg | ORAL_TABLET | Freq: Every day | ORAL | 3 refills | Status: DC
Start: 2020-02-02 — End: 2020-05-14

## 2020-02-02 NOTE — Telephone Encounter (Signed)
Go ahead nad take klonopin and see if helps

## 2020-02-02 NOTE — Chronic Care Management (AMB) (Signed)
Chronic Care Management    Clinical Social Work Follow Up Note  02/02/2020 Name: Haley Trevino MRN: [id number] DOB: 06/04/1941  Haley Trevino is a 79 y.o. year old female who is a primary care patient of Haley Trevino, [place]. The CCM team was consulted for assistance with Haley Trevino.   Review of patient status, including review of consultants reports, other relevant assessments, and collaboration with appropriate care team members and the patient's provider was performed as part of comprehensive patient evaluation and provision of chronic care management services.    SDOH (Social Determinants of Health) assessments performed: No;risk for stress;risk for depression; risk for physical inactivity; risk for transport needs    Office Visit from 12/02/2019 in Haley Trevino  PHQ-9 Total Score 5       GAD 7 : Generalized Anxiety Score 10/24/2019 04/14/2019  Nervous, Anxious, on Edge 3 2  Control/stop worrying 1 1  Worry too much - different things 1 1  Trouble relaxing 0 1  Restless 0 0  Easily annoyed or irritable 0 0  Afraid - awful might happen 0 1  Total GAD 7 Score 5 6  Anxiety Difficulty Somewhat difficult -    Outpatient Encounter Medications as of 02/02/2020  Medication Sig  . baclofen (LIORESAL) 10 MG tablet Take 0.5 tablets (5 mg total) by mouth 3 (three) times daily.  . Cholecalciferol (VITAMIN D3) 2000 units TABS Take 2,000 Units by mouth daily.  . diclofenac (VOLTAREN) 75 MG EC tablet Take 1 tablet (75 mg total) by mouth 2 (two) times daily.  Marland Kitchen ezetimibe (ZETIA) 10 MG tablet Take 1 tablet (10 mg total) by mouth daily.  . ferrous sulfate 325 (65 FE) MG tablet Take 1 tablet (325 mg total) by mouth 2 (two) times daily with a meal.  . FLUoxetine (PROZAC) 40 MG capsule Take 1 capsule (40 mg total) by mouth daily.  Marland Kitchen HYDROcodone-acetaminophen (NORCO/VICODIN) 5-325 MG tablet Take 1 tablet by mouth every 6 (six) hours as needed for moderate  pain.  . meclizine (ANTIVERT) 25 MG tablet Take 1 tablet (25 mg total) by mouth 3 (three) times daily as needed for dizziness.  Marland Kitchen NIFEdipine (PROCARDIA XL/NIFEDICAL XL) 60 MG 24 hr tablet Take 1 tablet (60 mg total) by mouth daily.  Marland Kitchen omeprazole (PRILOSEC) 20 MG capsule TAKE 1 TABLET DAILY  . sildenafil (REVATIO) 20 MG tablet Take 1 tablet (20 mg total) by mouth 3 (three) times daily.  Marland Kitchen SYNTHROID 75 MCG tablet Take 1 tablet (75 mcg total) by mouth daily before breakfast.   No facility-administered encounter medications on file as of 02/02/2020.    Goals Addressed              This Visit's Progress   .  Client will talk with LCSW in next 30 days to discuss stress issues of client and managing stress issues faced (pt-stated)        CARE PLAN ENTRY   Current Barriers:  . Pain barriers in client with chronic diagnoses of OA, Hypothyroidism, Depression, GERD, HTN, HLD, Osteoporosis, [name] . Anemia  . Fatigue . Potential for falls  Clinical Social Work Clinical Goal(s):  Marland Kitchen LCSW will call client in next 30 days to talk with client about managing stress issues of client  Interventions:  Encouraged client to talk with RNCM to discuss nursing needs of client  Talked with client about sleeping challenges of client  Talked with client about her decreased appetite  Talked with client about  ambulation challenges of client   Talked with client about her recent medical appointments  Talked with client about transport needs of client  Talked with client about meal provision of client  Talked with client about anxiety issues of client    Talked with client about social support network (has support from her daughter and some help from her spouse)  Talked with kaiyana about her periodic headaches  LCSW and client talked of client high blood pressure readings  LCSW and client talked of pain issues of client   LCSW talked with client about client weight loss (client said  she has lost about 14 pounds in past two months)  Talked with client about her upcoming GI appointment with Haley Trevino on March 01, 2020  Collaborated with Haley Trevino regarding nursing needs of client  Patient Self Care Activities:  Does daily ADLS independently Takes medications as prescribed Attends medical appointments  Patient Self Care Deficits . Anemia . Weakness, fatigue . Fall potential  Initial goal documentation      Follow Up Plan: LCSW to call client in next 4 weeks to talk with her about stress issues faced by client and to talk about client strategies for managing stress issues faced  Haley Trevino MSW, LCSW Licensed Clinical Social Worker [facility] Care Management [phone]

## 2020-02-02 NOTE — Telephone Encounter (Signed)
02/02/2020  I was consulted by Theadore Nan, LCSW or spoke with patient this morning. Per Nicki Reaper, patient's blood pressure was 177/68 this morning and she complains of a headache. Reports recent elevations around the same level. She is anxious and would like a call back to discuss management options.   I feel that she needs to be seen. Will forward to [facility] Triage nurse for outreach and scheduling.   Chong Sicilian, BSN, RN-BC Embedded Chronic Care Manager [facility] / [place] Management Direct Dial: [phone]

## 2020-02-02 NOTE — Telephone Encounter (Signed)
Pt aware of recommendations

## 2020-02-02 NOTE — Addendum Note (Signed)
Addended by: [name] on: 02/02/2020 08:02 AM   Modules accepted: Orders

## 2020-02-02 NOTE — Patient Instructions (Addendum)
Licensed Clinical Social Worker Visit Information  Goals we discussed today:  Goals Addressed              This Visit's Progress   .  Client will talk with LCSW in next 30 days to discuss stress issues of client and managing stress issues faced (pt-stated)         Current Barriers:  . Pain barriers in client with chronic diagnoses of OA, Hypothyroidism, Depression, GERD, HTN, HLD, Osteoporosis, [name] . Anemia  . Fatigue . Potential for falls  Clinical Social Work Clinical Goal(s):  Marland Kitchen LCSW will call client in next 30 days to talk with client about managing stress issues of client  Interventions:       Encouraged client to talk with RNCM to discuss nursing needs of client  Talked with client about sleeping challenges of client  Talked with client about her decreased appetite  Talked with client about ambulation challenges of client   Talked with client about her recent medical appointments  Talked with client about transport needs of client  Talked with client about meal provision of client  Talked with client about anxiety issues of client   Talked with client about social support network (has support from her daughter and some help from her spouse)  Talked with Dorine about her periodic headaches  LCSW and client talked of client high blood pressure readings  LCSW and client talked of pain issues of client   LCSW talked with client about client weight loss (client said she has lost about 14 pounds in past two months)  Talked with client about her upcoming GI appointment with Dr. Watt Climes on March 01, 2020  Collaborated with [facility] regarding nursing needs of client  Patient Self Care Activities:  Does daily ADLS independently Takes medications as prescribed Attends medical appointments  Patient Self Care Deficits  Anemia  Weakness, fatigue  Fall potential  Initial goal documentation      Follow Up Plan: LCSW to call client in  next 4 weeks to talk with her about stress issues faced by client and to talk about client strategies for managing stress issues faced  Initial Care Plan documentation       Materials Provided: No  The patient verbalized understanding of instructions provided today and declined a print copy of patient instruction materials.   Norva Riffle.Xylan Sheils MSW, LCSW Licensed Clinical Social Worker [facility] Care Management [phone]

## 2020-02-02 NOTE — Chronic Care Management (AMB) (Signed)
Chronic Care Management   Follow Up Note   02/02/2020 Name: Haley Trevino MRN: [id number] DOB: 10-Feb-1941  Referred by: [name], FNP Reason for referral : Chronic Care Management (Care Coordination)   Haley Trevino is a 79 y.o. year old female who is a primary care patient of [name], [place]. The CCM team was consulted for assistance with chronic disease management and care coordination needs.    I was consulted by Theadore Nan, LCSW regarding patient's elevated blood pressure and anxiety.   Outpatient Encounter Medications as of 02/02/2020  Medication Sig  . baclofen (LIORESAL) 10 MG tablet Take 0.5 tablets (5 mg total) by mouth 3 (three) times daily.  . Cholecalciferol (VITAMIN D3) 2000 units TABS Take 2,000 Units by mouth daily.  . diclofenac (VOLTAREN) 75 MG EC tablet Take 1 tablet (75 mg total) by mouth 2 (two) times daily.  Marland Kitchen ezetimibe (ZETIA) 10 MG tablet Take 1 tablet (10 mg total) by mouth daily.  . ferrous sulfate 325 (65 FE) MG tablet Take 1 tablet (325 mg total) by mouth 2 (two) times daily with a meal.  . FLUoxetine (PROZAC) 40 MG capsule Take 1 capsule (40 mg total) by mouth daily.  Marland Kitchen HYDROcodone-acetaminophen (NORCO/VICODIN) 5-325 MG tablet Take 1 tablet by mouth every 6 (six) hours as needed for moderate pain.  . meclizine (ANTIVERT) 25 MG tablet Take 1 tablet (25 mg total) by mouth 3 (three) times daily as needed for dizziness.  Marland Kitchen NIFEdipine (PROCARDIA XL/NIFEDICAL XL) 60 MG 24 hr tablet Take 1 tablet (60 mg total) by mouth daily.  Marland Kitchen omeprazole (PRILOSEC) 20 MG capsule TAKE 1 TABLET DAILY  . sildenafil (REVATIO) 20 MG tablet Take 1 tablet (20 mg total) by mouth 3 (three) times daily.  Marland Kitchen SYNTHROID 75 MCG tablet Take 1 tablet (75 mcg total) by mouth daily before breakfast.   No facility-administered encounter medications on file as of 02/02/2020.     BP Readings from Last 3 Encounters:  01/30/20 (!) 173/73  01/23/20 (!) 146/66    01/17/20 128/65    RN Care Plan    .  Per LCSW: "I want to get my blood pressure under control" (pt-stated)        CARE PLAN ENTRY (see longitudinal plan of care for additional care plan information)  Current Barriers:  . Chronic Disease Management support and education needs related to hypertension . Anxiety . Recent med changes  Nurse Case Manager Clinical Goal(s):  Marland Kitchen Over the next 3 days, patient will work with PCP/clinical staff regarding elevated blood pressure . Over the next 30 days, patient will talk with RN Care Manager regarding blood pressure management . Over the next 90 days, patient will keep all scheduled medical appointments  Interventions:  . Inter-disciplinary care team collaboration (see longitudinal plan of care) . Chart reviewed including recent office and hospital notes as well as lab results . Reviewed medications o Recent ED visit. BP was elevated at that time.  . Consulted by LCSW after he spoke with patient by telephone earlier today o Per LCSW, patient has had elevated blood pressure recently with reading today of 177/68.  o Patient has history of anxiety o Recent medication changes . Collaborated with [facility] Clinical staff and requested that they schedule a f/u visit with PCP ASAP or discuss other management options with her if appointment is unavailable  . CCM team to follow-up with patient again over the next 14 days  Patient Self Care Activities:  . Performs  ADL's independently . Performs IADL's independently  Initial goal documentation        Plan:   The care management team will reach out to the patient again over the next 14 days.    Chong Sicilian, BSN, RN-BC Embedded Chronic Care Manager [facility] / [place] Management Direct Dial: [phone]

## 2020-02-02 NOTE — Patient Instructions (Signed)
Visit Information  Goals Addressed              This Visit's Progress     Patient Stated   .  Per LCSW: "I want to get my blood pressure under control" (pt-stated)        CARE PLAN ENTRY (see longitudinal plan of care for additional care plan information)  Current Barriers:  . Chronic Disease Management support and education needs related to hypertension . Anxiety . Recent med changes  Nurse Case Manager Clinical Goal(s):  Marland Kitchen Over the next 3 days, patient will work with PCP/clinical staff regarding elevated blood pressure . Over the next 30 days, patient will talk with RN Care Manager regarding blood pressure management . Over the next 90 days, patient will keep all scheduled medical appointments  Interventions:  . Inter-disciplinary care team collaboration (see longitudinal plan of care) . Chart reviewed including recent office and hospital notes as well as lab results . Reviewed medications o Recent ED visit. BP was elevated at that time.  . Consulted by LCSW after he spoke with patient by telephone earlier today o Per LCSW, patient has had elevated blood pressure recently with reading today of 177/68.  o Patient has history of anxiety o Recent medication changes . Collaborated with [facility] Clinical staff and requested that they schedule a f/u visit with PCP ASAP or discuss other management options with her if appointment is unavailable  . CCM team to follow-up with patient again over the next 14 days  Patient Self Care Activities:  . Performs ADL's independently . Performs IADL's independently  Initial goal documentation       Follow-up Plan The care management team will reach out to the patient again over the next 14 days.   Chong Sicilian, BSN, RN-BC Embedded Chronic Care Manager [facility] [place] Management Direct Dial: [phone]

## 2020-02-02 NOTE — Telephone Encounter (Signed)
Spoke with patient, she has recently been experiencing some elevated blood pressure and a headache.  She has checked it several times today and her readings have been 185/90, 178/76, 164/78, and 153/87.  She is feeling anxious and feels as if the elevated readings could be coming from that.  She does have Klonopin on hand but has not taken.  I advised the patient to go ahead and take one Klonopin and told her I would pass this information along to you.  What do you recommend?

## 2020-02-06 ENCOUNTER — Telehealth: Payer: Self-pay | Admitting: Nurse Practitioner

## 2020-02-06 MED ORDER — LISINOPRIL 20 MG PO TABS
20.0000 mg | ORAL_TABLET | Freq: Every day | ORAL | 3 refills | Status: DC
Start: 2020-02-06 — End: 2020-02-20

## 2020-02-06 NOTE — Telephone Encounter (Signed)
SENT in prescriptions for lisinopril 20mg  daily for blood pressure- continue to keep diary and let me know if helps.

## 2020-02-06 NOTE — Telephone Encounter (Signed)
Patient aware and verbalizes understanding. 

## 2020-02-06 NOTE — Telephone Encounter (Signed)
See previous note

## 2020-02-06 NOTE — Telephone Encounter (Signed)
Below are patients BP readings:  02/03/20:  217/108 (before medication) 183/65 172/76 166/79 168/78 173/76 142/53  02/05/20: 160/86 172/70 143/93  02/06/20: 219/107 (before medication) 174/76 167/83 (10am)  Patient states her BP has been running higher than normal and she also has been having "heavy feeling in her chest".  States she went to [facility] on 01/30/20 due to that and states that her heart was fine.  She thinks it is from anxiety.  States she has no other symptoms.  Advised patient to go to ER due to "heavy feeling in chest"- patient refused and states she is not going to ER since her husband is now at [facility]  Please advise

## 2020-02-06 NOTE — Telephone Encounter (Signed)
Pt has question about high blood pressure and NIFEdipine (PROCARDIA XL/NIFEDICAL XL) 60 MG 24 hr tablet. Please call back

## 2020-02-07 ENCOUNTER — Telehealth: Payer: Self-pay | Admitting: Nurse Practitioner

## 2020-02-07 NOTE — Telephone Encounter (Signed)
Patient aware ok to start taking Nifedipine daily, lisinopril daily and zetia daily

## 2020-02-10 ENCOUNTER — Ambulatory Visit (HOSPITAL_COMMUNITY)
Admission: RE | Admit: 2020-02-10 | Discharge: 2020-02-10 | Disposition: A | Discharge status: Home / Self Care | Payer: Medicare Other | Source: Ambulatory Visit | Attending: Nurse Practitioner | Admitting: Nurse Practitioner

## 2020-02-10 ENCOUNTER — Other Ambulatory Visit: Payer: Self-pay

## 2020-02-10 DIAGNOSIS — S22070A Wedge compression fracture of T9-T10 vertebra, initial encounter for closed fracture: Secondary | ICD-10-CM | POA: Diagnosis not present

## 2020-02-10 DIAGNOSIS — S22060A Wedge compression fracture of T7-T8 vertebra, initial encounter for closed fracture: Secondary | ICD-10-CM | POA: Diagnosis not present

## 2020-02-10 DIAGNOSIS — M546 Pain in thoracic spine: Secondary | ICD-10-CM | POA: Diagnosis not present

## 2020-02-15 ENCOUNTER — Telehealth: Payer: Self-pay

## 2020-02-15 NOTE — Telephone Encounter (Signed)
Patient's blood pressure is still elevated in the mornings upon awakening. This morning is was 204/107. She took hour or so later and it was 172/84.  She denies swelling, vision changes. She does have headaches. Patient admits to not feeling well. Does not feel like doing anything.  States that her iron is low and she has been having stomach troubles.  I offered to schedule an appt on the next night call day on the 16th but pt wants Mary-Margarets opinion now.

## 2020-02-15 NOTE — Telephone Encounter (Signed)
Appt made for Monday. °

## 2020-02-15 NOTE — Telephone Encounter (Signed)
Double up on zestril and let me know how does over the weekend

## 2020-02-20 ENCOUNTER — Encounter: Payer: Self-pay | Admitting: Nurse Practitioner

## 2020-02-20 ENCOUNTER — Ambulatory Visit (INDEPENDENT_AMBULATORY_CARE_PROVIDER_SITE_OTHER): Payer: Medicare Other | Admitting: Nurse Practitioner

## 2020-02-20 ENCOUNTER — Other Ambulatory Visit: Payer: Self-pay

## 2020-02-20 VITALS — BP 134/69 | HR 69 | Temp 98.4°F | Resp 20 | Ht 62.0 in | Wt 103.0 lb

## 2020-02-20 DIAGNOSIS — S22000A Wedge compression fracture of unspecified thoracic vertebra, initial encounter for closed fracture: Secondary | ICD-10-CM

## 2020-02-20 DIAGNOSIS — I10 Essential (primary) hypertension: Secondary | ICD-10-CM | POA: Diagnosis not present

## 2020-02-20 DIAGNOSIS — F419 Anxiety disorder, unspecified: Secondary | ICD-10-CM | POA: Diagnosis not present

## 2020-02-20 MED ORDER — CLONAZEPAM 0.5 MG PO TABS: 0.5000 mg | ORAL_TABLET | Freq: Two times a day (BID) | ORAL | 2 refills | Status: DC | PRN

## 2020-02-20 MED ORDER — LISINOPRIL 40 MG PO TABS: 40.0000 mg | ORAL_TABLET | Freq: Every day | ORAL | 1 refills | Status: DC

## 2020-02-20 MED ORDER — CLONIDINE HCL 0.1 MG PO TABS: 0.1000 mg | ORAL_TABLET | Freq: Two times a day (BID) | ORAL | 1 refills | Status: DC

## 2020-02-20 NOTE — Progress Notes (Signed)
° °  Subjective:    Patient ID: Haley Trevino, female    DOB: Oct 22, 1940, 79 y.o.   MRN: [id number]   Chief Complaint: Blood pressure has been up   HPI Patient comes in today stating she is still having problems with her blood pressure. She was last seen 01/23/20 for chronic follow up. We made no changes to her medications at that visit. She os on lisinopril 40mg  daily. Every morning her blood pressure is around 532 systolic. She has put herself back on klonopin 0.5 which has helped with her nerves. Her husband is very sick and she has become very anxious. She stopped her pain medication because it made her feel so bad.  * wants MRI results- compression fracture at T7 and T9 with 50% height loss at both levels  Review of Systems  Constitutional: Negative for diaphoresis.  Eyes: Negative for pain.  Respiratory: Negative for shortness of breath.   Cardiovascular: Negative for chest pain, palpitations and leg swelling.  Gastrointestinal: Negative for abdominal pain.  Endocrine: Negative for polydipsia.  Musculoskeletal: Positive for arthralgias, back pain and myalgias.  Skin: Negative for rash.  Neurological: Negative for dizziness, weakness and headaches.  Hematological: Does not bruise/bleed easily.  All other systems reviewed and are negative.      Objective:   Physical Exam Nursing note reviewed.  Constitutional:      Appearance: Normal appearance.  Cardiovascular:     Rate and Rhythm: Normal rate and regular rhythm.     Heart sounds: Murmur (2/6) heard.   Pulmonary:     Breath sounds: Normal breath sounds.  Skin:    General: Skin is warm.  Neurological:     General: No focal deficit present.     Mental Status: She is alert and oriented to person, place, and time.    BP 134/69    Pulse 69    Temp 98.4 F (36.9 C) (Temporal)    Resp 20    Ht 5\' 2"  (1.575 m)    Wt 103 lb (46.7 kg)    SpO2 98%    BMI 18.84 kg/m         Assessment & Plan:  [name] in  today with chief complaint of Blood pressure has been up   1. Compression fracture of body of thoracic vertebra [facility]) Patient will think about whether she wants to see specialist or not.  2. Essential hypertension Low sodium diet - lisinopril (ZESTRIL) 40 MG tablet; Take 1 tablet (40 mg total) by mouth daily.  Dispense: 90 tablet; Refill: 1 - cloNIDine (CATAPRES) 0.1 MG tablet; Take 1 tablet (0.1 mg total) by mouth 2 (two) times daily.  Dispense: 180 tablet; Refill: 1  3. Anxiety Stress management - clonazePAM (KLONOPIN) 0.5 MG tablet; Take 1 tablet (0.5 mg total) by mouth 2 (two) times daily as needed for anxiety.  Dispense: 60 tablet; Refill: 2    The above assessment and management plan was discussed with the patient. The patient verbalized understanding of and has agreed to the management plan. Patient is aware to call the clinic if symptoms persist or worsen. Patient is aware when to return to the clinic for a follow-up visit. Patient educated on when it is appropriate to go to the emergency department.   Mary-Margaret Hassell Done, FNP

## 2020-03-01 ENCOUNTER — Other Ambulatory Visit: Payer: Self-pay | Admitting: Gastroenterology

## 2020-03-01 DIAGNOSIS — R1311 Dysphagia, oral phase: Secondary | ICD-10-CM | POA: Diagnosis not present

## 2020-03-01 DIAGNOSIS — D500 Iron deficiency anemia secondary to blood loss (chronic): Secondary | ICD-10-CM | POA: Diagnosis not present

## 2020-03-01 DIAGNOSIS — R1310 Dysphagia, unspecified: Secondary | ICD-10-CM

## 2020-03-05 ENCOUNTER — Other Ambulatory Visit (HOSPITAL_COMMUNITY): Payer: Self-pay | Admitting: Interventional Radiology

## 2020-03-05 ENCOUNTER — Telehealth: Payer: Self-pay | Admitting: Nurse Practitioner

## 2020-03-05 ENCOUNTER — Other Ambulatory Visit: Payer: Self-pay | Admitting: Nurse Practitioner

## 2020-03-05 DIAGNOSIS — S22060A Wedge compression fracture of T7-T8 vertebra, initial encounter for closed fracture: Secondary | ICD-10-CM

## 2020-03-05 DIAGNOSIS — S22070A Wedge compression fracture of T9-T10 vertebra, initial encounter for closed fracture: Secondary | ICD-10-CM

## 2020-03-05 NOTE — Telephone Encounter (Signed)
Dr Dartha Lodge office called stating that pt is scheduled to have a Kyphoplasty done on 03/13/20 and wants to know if MMM wants pt to also have a biopsy done during visit?

## 2020-03-05 NOTE — Telephone Encounter (Signed)
Per MMM.  Patient does not need biopsy

## 2020-03-05 NOTE — Telephone Encounter (Signed)
Biopsy of what

## 2020-03-06 ENCOUNTER — Ambulatory Visit
Admission: RE | Admit: 2020-03-06 | Discharge: 2020-03-06 | Disposition: A | Discharge status: Home / Self Care | Payer: Medicare Other | Source: Ambulatory Visit | Attending: Gastroenterology | Admitting: Gastroenterology

## 2020-03-06 DIAGNOSIS — K224 Dyskinesia of esophagus: Secondary | ICD-10-CM | POA: Diagnosis not present

## 2020-03-06 DIAGNOSIS — K2100 Gastro-esophageal reflux disease with esophagitis, without bleeding: Secondary | ICD-10-CM | POA: Diagnosis not present

## 2020-03-06 DIAGNOSIS — R1310 Dysphagia, unspecified: Secondary | ICD-10-CM

## 2020-03-08 ENCOUNTER — Telehealth: Payer: Self-pay | Admitting: Nurse Practitioner

## 2020-03-08 ENCOUNTER — Ambulatory Visit: Payer: Medicare Other | Admitting: Licensed Clinical Social Worker

## 2020-03-08 DIAGNOSIS — F3342 Major depressive disorder, recurrent, in full remission: Secondary | ICD-10-CM

## 2020-03-08 DIAGNOSIS — I10 Essential (primary) hypertension: Secondary | ICD-10-CM

## 2020-03-08 DIAGNOSIS — K219 Gastro-esophageal reflux disease without esophagitis: Secondary | ICD-10-CM

## 2020-03-08 DIAGNOSIS — M810 Age-related osteoporosis without current pathological fracture: Secondary | ICD-10-CM

## 2020-03-08 DIAGNOSIS — M159 Polyosteoarthritis, unspecified: Secondary | ICD-10-CM

## 2020-03-08 DIAGNOSIS — E785 Hyperlipidemia, unspecified: Secondary | ICD-10-CM

## 2020-03-08 DIAGNOSIS — E039 Hypothyroidism, unspecified: Secondary | ICD-10-CM

## 2020-03-08 DIAGNOSIS — I7301 Raynaud's syndrome with gangrene: Secondary | ICD-10-CM

## 2020-03-08 DIAGNOSIS — I129 Hypertensive chronic kidney disease with stage 1 through stage 4 chronic kidney disease, or unspecified chronic kidney disease: Secondary | ICD-10-CM

## 2020-03-08 NOTE — Telephone Encounter (Signed)
Patient notified and verbalized understanding. 

## 2020-03-08 NOTE — Telephone Encounter (Signed)
Ordered renal scan to make sure renal arteries are good- they will call with apointment. Double up on clonidine - take 2 po 2x a day and let me know if helps.

## 2020-03-08 NOTE — Patient Instructions (Addendum)
Licensed Clinical Social Worker Visit Information  Goals we discussed today:    .  Client will talk with LCSW in next 30 days to discuss stress issues of client and managing stress issues faced (pt-stated)       CARE PLAN ENTRY   Current Barriers:   Pain barriers in client with chronic diagnoses of OA, Hypothyroidism, Depression, GERD, HTN, HLD, Osteoporosis, [name]  Anemia   Fatigue  Potential for falls  Clinical Social Work Clinical Goal(s):   LCSW will call client in next 30 days to talk with client about managing stress issues of client  Interventions:  Talked with client about CCM program support  Previously encouraged client to talk with RNCM to discuss nursing needs of client  Talked with client about stress issues of client  Talked with client about pain issues of client (she spoke of pain in her back)  Talked with client about sleeping challenges of client  Talked with client about relaxation techniques (enjoys crafts, reading,cares for pets)  Talked with client about ambulation challenges of client   Talked with client about her upcoming medical procedure for her back next week at [facility]  Talked with client about transport needs of client  Patient Self Care Activities:  Does daily ADLS independently Takes medications as prescribed Attends medical appointments  Patient Self Care Deficits  Anemia  Weakness, fatigue  Fall potential    Initial goal documentation    Follow Up Plan: LCSW to call client in next 4 weeks to talk with her about stress issues faced by client and to talk about client strategies for managing stress issues faced  Materials Provided: No  The patient verbalized understanding of instructions provided today and declined a print copy of patient instruction materials.   Norva Riffle.Haley Trevino MSW, LCSW Licensed Clinical Social Worker [facility] [phone]

## 2020-03-08 NOTE — Telephone Encounter (Signed)
Pt called stating that she is still having issues with her BP running high. Pt had recent visit with MMM on 02/20/20 and her BP med was changed and added to what she was already taking. She is now taking 3 BP Rx's and says when she woke up this morning her BP was 187/93 (which she says is lower than what it normally is) and after taking her BP meds her BP is 157/79. Wants advise on what to do.. Pt said her BP on Monday was 210/98.Marland Kitchen

## 2020-03-08 NOTE — Chronic Care Management (AMB) (Signed)
Chronic Care Management    Clinical Social Work Follow Up Note  03/08/2020 Name: Haley Trevino MRN: [id number] DOB: 09/12/1940  Haley Trevino is a 79 y.o. year old female who is a primary care patient of [name], [place]. The CCM team was consulted for assistance with [name] .   Review of patient status, including review of consultants reports, other relevant assessments, and collaboration with appropriate care team members and the patient's provider was performed as part of comprehensive patient evaluation and provision of chronic care management services.    SDOH (Social Determinants of Health) assessments performed: No;risk for stress; risk for depression; risk for physical inactivity; risk for transport needs    Office Visit from 12/02/2019 in [facility]  PHQ-9 Total Score 5     GAD 7 : Generalized Anxiety Score 02/20/2020 10/24/2019 04/14/2019  Nervous, Anxious, on Edge 3 3 2   Control/stop worrying 0 1 1  Worry too much - different things 0 1 1  Trouble relaxing 3 0 1  Restless 0 0 0  Easily annoyed or irritable 0 0 0  Afraid - awful might happen 1 0 1  Total GAD 7 Score 7 5 6   Anxiety Difficulty Not difficult at all Somewhat difficult -    Outpatient Encounter Medications as of 03/08/2020  Medication Sig Note  . Cholecalciferol (VITAMIN D3) 2000 units TABS Take 2,000 Units by mouth daily.   . clonazePAM (KLONOPIN) 0.5 MG tablet Take 1 tablet (0.5 mg total) by mouth 2 (two) times daily as needed for anxiety. (Patient taking differently: Take 0.5 mg by mouth 2 (two) times daily. )   . cloNIDine (CATAPRES) 0.1 MG tablet Take 1 tablet (0.1 mg total) by mouth 2 (two) times daily.   Marland Kitchen ezetimibe (ZETIA) 10 MG tablet Take 1 tablet (10 mg total) by mouth daily. 03/07/2020: Hasnt started  . ferrous sulfate 325 (65 FE) MG tablet Take 1 tablet (325 mg total) by mouth 2 (two) times daily with a meal.   . FLUoxetine (PROZAC) 40 MG capsule  Take 1 capsule (40 mg total) by mouth daily.   Marland Kitchen lisinopril (ZESTRIL) 40 MG tablet Take 1 tablet (40 mg total) by mouth daily.   Marland Kitchen NIFEdipine (PROCARDIA XL/NIFEDICAL XL) 60 MG 24 hr tablet Take 1 tablet (60 mg total) by mouth daily.   Marland Kitchen omeprazole (PRILOSEC) 20 MG capsule TAKE 1 TABLET DAILY (Patient taking differently: Take 20 mg by mouth daily. )   . sildenafil (REVATIO) 20 MG tablet Take 1 tablet (20 mg total) by mouth 3 (three) times daily. (Patient taking differently: Take 20 mg by mouth 2 (two) times daily. )   . SYNTHROID 75 MCG tablet Take 1 tablet (75 mcg total) by mouth daily before breakfast.    No facility-administered encounter medications on file as of 03/08/2020.    Goals    .  Client will talk with LCSW in next 30 days to discuss stress issues of client and managing stress issues faced (pt-stated)      CARE PLAN ENTRY   Current Barriers:  . Pain barriers in client with chronic diagnoses of OA, Hypothyroidism, Depression, GERD, HTN, HLD, Osteoporosis, [name] . Anemia  . Fatigue . Potential for falls  Clinical Social Work Clinical Goal(s):  Marland Kitchen LCSW will call client in next 30 days to talk with client about managing stress issues of client  Interventions: . Talked with client about CCM program support . Previously encouraged client to talk with  RNCM to discuss nursing needs of client . Talked with client about stress issues of client . Talked with client about pain issues of client (she spoke of pain in her back) . Talked with client about sleeping challenges of client . Talked with client about relaxation techniques (enjoys crafts, reading,cares for pets) . Talked with client about ambulation challenges of client  . Talked with client about her upcoming medical procedure for her back next week at [facility] . Talked with client about transport needs of client  Patient Self Care Activities:  Does daily ADLS independently Takes medications as  prescribed Attends medical appointments  Patient Self Care Deficits . Anemia . Weakness, fatigue . Fall potential .   Initial goal documentation    Follow Up Plan: LCSW to call client in next 4 weeks to talk with her about stress issues faced by client and to talk about client strategies for managing stress issues faced  Norva Riffle.Alysson Geist MSW, LCSW Licensed Clinical Social Worker [facility] [phone]

## 2020-03-09 ENCOUNTER — Other Ambulatory Visit: Payer: Self-pay | Admitting: Physician Assistant

## 2020-03-13 ENCOUNTER — Other Ambulatory Visit (HOSPITAL_COMMUNITY): Payer: Self-pay | Admitting: Interventional Radiology

## 2020-03-13 ENCOUNTER — Other Ambulatory Visit: Payer: Self-pay

## 2020-03-13 ENCOUNTER — Ambulatory Visit (HOSPITAL_COMMUNITY)
Admission: RE | Admit: 2020-03-13 | Discharge: 2020-03-13 | Disposition: A | Discharge status: Home / Self Care | Payer: Medicare Other | Source: Ambulatory Visit | Attending: Interventional Radiology | Admitting: Interventional Radiology

## 2020-03-13 DIAGNOSIS — M349 Systemic sclerosis, unspecified: Secondary | ICD-10-CM | POA: Insufficient documentation

## 2020-03-13 DIAGNOSIS — S22060A Wedge compression fracture of T7-T8 vertebra, initial encounter for closed fracture: Secondary | ICD-10-CM

## 2020-03-13 DIAGNOSIS — I10 Essential (primary) hypertension: Secondary | ICD-10-CM | POA: Insufficient documentation

## 2020-03-13 DIAGNOSIS — S22070A Wedge compression fracture of T9-T10 vertebra, initial encounter for closed fracture: Secondary | ICD-10-CM

## 2020-03-13 DIAGNOSIS — Z79890 Hormone replacement therapy (postmenopausal): Secondary | ICD-10-CM | POA: Insufficient documentation

## 2020-03-13 DIAGNOSIS — Z888 Allergy status to other drugs, medicaments and biological substances status: Secondary | ICD-10-CM | POA: Insufficient documentation

## 2020-03-13 DIAGNOSIS — F419 Anxiety disorder, unspecified: Secondary | ICD-10-CM | POA: Insufficient documentation

## 2020-03-13 DIAGNOSIS — X58XXXS Exposure to other specified factors, sequela: Secondary | ICD-10-CM | POA: Insufficient documentation

## 2020-03-13 DIAGNOSIS — Z79899 Other long term (current) drug therapy: Secondary | ICD-10-CM | POA: Diagnosis not present

## 2020-03-13 DIAGNOSIS — Z885 Allergy status to narcotic agent status: Secondary | ICD-10-CM | POA: Insufficient documentation

## 2020-03-13 DIAGNOSIS — M546 Pain in thoracic spine: Secondary | ICD-10-CM | POA: Diagnosis not present

## 2020-03-13 DIAGNOSIS — I7300 Raynaud's syndrome without gangrene: Secondary | ICD-10-CM | POA: Diagnosis not present

## 2020-03-13 DIAGNOSIS — E039 Hypothyroidism, unspecified: Secondary | ICD-10-CM | POA: Insufficient documentation

## 2020-03-13 DIAGNOSIS — S22000S Wedge compression fracture of unspecified thoracic vertebra, sequela: Secondary | ICD-10-CM | POA: Diagnosis not present

## 2020-03-13 HISTORY — PX: IR VERTEBROPLASTY CERV/THOR BX INC UNI/BIL INC/INJECT/IMAGING: IMG5515

## 2020-03-13 LAB — CBC
HCT: 36.1 % (ref 36.0–46.0)
Hemoglobin: 11.4 g/dL — ABNORMAL LOW (ref 12.0–15.0)
MCH: 27.3 pg (ref 26.0–34.0)
MCHC: 31.6 g/dL (ref 30.0–36.0)
MCV: 86.6 fL (ref 80.0–100.0)
Platelets: 263 10*3/uL (ref 150–400)
RBC: 4.17 MIL/uL (ref 3.87–5.11)
RDW: 16.4 % — ABNORMAL HIGH (ref 11.5–15.5)
WBC: 6.5 10*3/uL (ref 4.0–10.5)
nRBC: 0 % (ref 0.0–0.2)

## 2020-03-13 LAB — URINALYSIS, COMPLETE (UACMP) WITH MICROSCOPIC
Bacteria, UA: NONE SEEN
Bilirubin Urine: NEGATIVE
Glucose, UA: NEGATIVE mg/dL
Hgb urine dipstick: NEGATIVE
Ketones, ur: NEGATIVE mg/dL
Leukocytes,Ua: NEGATIVE
Nitrite: NEGATIVE
Protein, ur: NEGATIVE mg/dL
Specific Gravity, Urine: 1.012 (ref 1.005–1.030)
pH: 6 (ref 5.0–8.0)

## 2020-03-13 LAB — BASIC METABOLIC PANEL
Anion gap: 8 (ref 5–15)
BUN: 12 mg/dL (ref 8–23)
CO2: 24 mmol/L (ref 22–32)
Calcium: 8.9 mg/dL (ref 8.9–10.3)
Chloride: 105 mmol/L (ref 98–111)
Creatinine, Ser: 0.87 mg/dL (ref 0.44–1.00)
GFR calc Af Amer: >60 mL/min (ref >60)
GFR calc non Af Amer: >60 mL/min (ref >60)
Glucose, Bld: 104 mg/dL — ABNORMAL HIGH (ref 70–99)
Potassium: 4.5 mmol/L (ref 3.5–5.1)
Sodium: 137 mmol/L (ref 135–145)

## 2020-03-13 LAB — PROTIME-INR
INR: 1.1 (ref 0.8–1.2)
Prothrombin Time: 13.3 seconds (ref 11.4–15.2)

## 2020-03-13 MED ORDER — FENTANYL CITRATE (PF) 100 MCG/2ML IJ SOLN
INTRAMUSCULAR | Status: AC
  Filled 2020-03-13: qty 2

## 2020-03-13 MED ORDER — CEFAZOLIN SODIUM-DEXTROSE 2-4 GM/100ML-% IV SOLN
INTRAVENOUS | Status: AC
  Administered 2020-03-13: 2 g via INTRAVENOUS
  Filled 2020-03-13: qty 100

## 2020-03-13 MED ORDER — TOBRAMYCIN SULFATE 1.2 G IJ SOLR
INTRAMUSCULAR | Status: AC
  Filled 2020-03-13: qty 1.2

## 2020-03-13 MED ORDER — BUPIVACAINE HCL (PF) 0.5 % IJ SOLN
INTRAMUSCULAR | Status: AC | PRN
  Administered 2020-03-13: 20 mL

## 2020-03-13 MED ORDER — MIDAZOLAM HCL 2 MG/2ML IJ SOLN
INTRAMUSCULAR | Status: AC
  Filled 2020-03-13: qty 2

## 2020-03-13 MED ORDER — CEFAZOLIN SODIUM-DEXTROSE 2-4 GM/100ML-% IV SOLN: 2.0000 g | Freq: Once | INTRAVENOUS | Status: AC

## 2020-03-13 MED ORDER — IOHEXOL 300 MG/ML  SOLN
50.0000 mL | Freq: Once | INTRAMUSCULAR | Status: AC | PRN
  Administered 2020-03-13: 5 mL via INTRATHECAL

## 2020-03-13 MED ORDER — FENTANYL CITRATE (PF) 100 MCG/2ML IJ SOLN
INTRAMUSCULAR | Status: AC | PRN
  Administered 2020-03-13: 25 ug via INTRAVENOUS
  Administered 2020-03-13: 12.5 ug via INTRAVENOUS

## 2020-03-13 MED ORDER — SODIUM CHLORIDE 0.9 % IV SOLN: INTRAVENOUS | Status: DC

## 2020-03-13 MED ORDER — BUPIVACAINE HCL (PF) 0.5 % IJ SOLN
INTRAMUSCULAR | Status: AC
  Filled 2020-03-13: qty 30

## 2020-03-13 MED ORDER — SODIUM CHLORIDE 0.9 % IV SOLN: INTRAVENOUS | Status: AC

## 2020-03-13 MED ORDER — MIDAZOLAM HCL 2 MG/2ML IJ SOLN
INTRAMUSCULAR | Status: AC | PRN
  Administered 2020-03-13: 0.5 mg via INTRAVENOUS
  Administered 2020-03-13: 1 mg via INTRAVENOUS

## 2020-03-13 NOTE — H&P (Signed)
Chief Complaint: Patient was seen in consultation today for T7 and T9 compression fractures/vetebral augmentation.  Referring Physician(s): [name] (PCP)  Supervising Physician: Luanne Bras  Patient Status: [facility] - Out-pt  History of Present Illness: Haley Trevino is a 79 y.o. female with a past medical history of hypertension, thyroid disease, scleroderma, OA, Raynaud disease, cataracts, and anxiety. Of note, she is known to [facility]- she underwent an image-guided bilateral sacroplasty in IR 07/31/2017 by Dr. Estanislado Pandy with much success per patient. A few weeks ago, patient had an insidious onset of mid/low back pain. She went to her PCP for further evaluation. She was started on conservative management (pain control) and CXR was obtained revealing no acute abnormality. Her pain continued despise conservative management, and MR thoracic spine was obtained revealing subacute T7 and T9 compression fractures.  MR thoracic spine 02/10/2020: 1. Likely subacute compression fractures at T7 and T9 with approximately 50% height loss and no retropulsion.  NIR requested by [name], FNP for possible image-guided T7 and T9 kyphoplasty/vertebroplasty. Patient awake and alert sitting in bed. Complains of mid/low back pain, rated 7/10 at this time. States moving/deep breathing exacerbates pain, rating it as 10/10 at its worst. Denies fever, chills, chest pain, dyspnea, abdominal pain, urinary symptoms (dysuria, frequency, hematuria), or headache.   Past Medical History:  Diagnosis Date  . Cataract   . Dyspnea   . Osteoarthritis   . Raynaud disease   . Scleroderma ([place])   . Thyroid disease    hypothyroidism    Past Surgical History:  Procedure Laterality Date  . BREAST ENHANCEMENT SURGERY  1975  . BREAST IMPLANT REMOVAL Bilateral 04/23/2016   Procedure: REMOVALBILATERAL BREAST IMPLANTS;  Surgeon: Wallace Going, DO;  Location: [facility];   Service: Plastics;  Laterality: Bilateral;  . CHOLECYSTECTOMY  1973  . HAND SURGERY  08/2012; 11/2012  . IR RADIOLOGIST EVAL & MGMT  07/27/2017  . IR SACROPLASTY BILATERAL  07/31/2017  . MELANOMA EXCISION     at 65 yrs of age  . ORIF HIP FRACTURE Right 02/22/2014   Procedure: OPEN REDUCTION INTERNAL FIXATION RIGHT HIP;  Surgeon: Sanjuana Kava, MD;  Location: [facility];  Service: Orthopedics;  Laterality: Right;  . TOTAL ABDOMINAL HYSTERECTOMY  1974    Allergies: Dilaudid [hydromorphone hcl], Acyclovir and related, Crestor [rosuvastatin calcium], Gabapentin, Aleve [naproxen sodium], and Lyrica [pregabalin]  Medications: Prior to Admission medications   Medication Sig Start Date End Date Taking? Authorizing Provider  acetaminophen (TYLENOL) 500 MG tablet Take 1,000 mg by mouth every 6 (six) hours as needed for moderate pain or headache.   Yes [provider]  Bismuth Subsalicylate (PEPTO-BISMOL) 262 MG TABS Take 524 mg by mouth daily as needed (indigestion).   Yes [provider]  Cholecalciferol (VITAMIN D3) 2000 units TABS Take 2,000 Units by mouth daily.   Yes [provider]  clonazePAM (KLONOPIN) 0.5 MG tablet Take 1 tablet (0.5 mg total) by mouth 2 (two) times daily as needed for anxiety. Patient taking differently: Take 0.5 mg by mouth 2 (two) times daily.  02/20/20  Yes Hassell Done, Mary-Margaret, FNP  cloNIDine (CATAPRES) 0.1 MG tablet Take 1 tablet (0.1 mg total) by mouth 2 (two) times daily. 02/20/20  Yes Hassell Done, Mary-Margaret, FNP  ferrous sulfate 325 (65 FE) MG tablet Take 1 tablet (325 mg total) by mouth 2 (two) times daily with a meal. 01/23/20  Yes Hassell Done, Mary-Margaret, FNP  FLUoxetine (PROZAC) 40 MG capsule Take 1 capsule (40 mg total)  by mouth daily. 01/23/20  Yes Martin, Mary-Margaret, FNP  lisinopril (ZESTRIL) 40 MG tablet Take 1 tablet (40 mg total) by mouth daily. 02/20/20  Yes Hassell Done, Mary-Margaret, FNP  magnesium hydroxide (PHILLIPS MILK OF MAGNESIA) 311 MG  CHEW chewable tablet Chew 311 mg by mouth every 4 (four) hours as needed (indigestion).   Yes [provider]  NIFEdipine (PROCARDIA XL/NIFEDICAL XL) 60 MG 24 hr tablet Take 1 tablet (60 mg total) by mouth daily. 01/23/20  Yes Martin, Mary-Margaret, FNP  omeprazole (PRILOSEC) 20 MG capsule TAKE 1 TABLET DAILY Patient taking differently: Take 20 mg by mouth daily.  01/23/20  Yes Hassell Done, Mary-Margaret, FNP  Probiotic Product ([place]) CAPS Take 1 capsule by mouth daily.   Yes [provider]  PROLIA 60 MG/ML SOSY injection Inject 60 mg into the skin every 6 (six) months. 01/16/20  Yes [provider]  sildenafil (REVATIO) 20 MG tablet Take 1 tablet (20 mg total) by mouth 3 (three) times daily. Patient taking differently: Take 20 mg by mouth 2 (two) times daily.  01/23/20  Yes Hassell Done, Mary-Margaret, FNP  SYNTHROID 75 MCG tablet Take 1 tablet (75 mcg total) by mouth daily before breakfast. 01/23/20  Yes Hassell Done, Mary-Margaret, FNP  trolamine salicylate (ASPERCREME) 10 % cream Apply 1 application topically as needed (knee pain).   Yes [provider]  ezetimibe (ZETIA) 10 MG tablet Take 1 tablet (10 mg total) by mouth daily. 02/02/20   [name], FNP     Family History  Problem Relation Age of Onset  . Pancreatic cancer Father   . Raynaud syndrome Father   . Cancer Father        pancreatic  . Rashes / Skin problems Daughter        possibly scleraderma  . Hip fracture Mother   . Mental illness Mother        attempted suicide at 13 yo    Social History   Socioeconomic History  . Marital status: Married    Spouse name: Not on file  . Number of children: 2  . Years of education: Not on file  . Highest education level: Some college, no degree  Occupational History  . Occupation: retired Occupational psychologist at [employer]  Tobacco Use  . Smoking status: Never Smoker  . Smokeless tobacco: Never Used  . Tobacco comment: passive tobacco smoke  exposure as child  Vaping Use  . Vaping Use: Never used  Substance and Sexual Activity  . Alcohol use: No  . Drug use: No  . Sexual activity: Not Currently    Birth control/protection: Surgical  Other Topics Concern  . Not on file  Social History Narrative   Regular exercise: walking   Caffeine use: 1 cup of coffee daily   Social Determinants of Health   Financial Resource Strain: Low Risk   . Difficulty of Paying Living Expenses: Not hard at all  Food Insecurity: No Food Insecurity  . Worried About Charity fundraiser in the Last Year: Never true  . Ran Out of Food in the Last Year: Never true  Transportation Needs: Unmet Transportation Needs  . Lack of Transportation (Medical): Yes  . Lack of Transportation (Non-Medical): Yes  Physical Activity: Inactive  . Days of Exercise per Week: 0 days  . Minutes of Exercise per Session: 0 min  Stress: Stress Concern Present  . Feeling of Stress : Very much  Social Connections: Socially Integrated  . Frequency of Communication with Friends and Family:  More than three times a week  . Frequency of Social Gatherings with Friends and Family: Once a week  . Attends Religious Services: 1 to 4 times per year  . Active Member of Clubs or Organizations: Yes  . Attends Archivist Meetings: Never  . Marital Status: Married     Review of Systems: A 12 point ROS discussed and pertinent positives are indicated in the HPI above.  All other systems are negative.  Review of Systems  Constitutional: Negative for chills and fever.  Respiratory: Negative for shortness of breath and wheezing.   Cardiovascular: Negative for chest pain and palpitations.  Gastrointestinal: Negative for abdominal pain.  Genitourinary: Negative for dysuria, frequency and hematuria.  Musculoskeletal: Positive for back pain.  Neurological: Negative for headaches.  Psychiatric/Behavioral: Negative for behavioral problems and confusion.    Vital Signs: BP (!)  111/44   Pulse (!) 48   Temp 98.6 F (37 C) (Oral)   Resp 16   SpO2 93%   Physical Exam Vitals and nursing note reviewed.  Constitutional:      General: She is not in acute distress.    Appearance: Normal appearance.  Cardiovascular:     Rate and Rhythm: Normal rate and regular rhythm.     Heart sounds: Normal heart sounds. No murmur heard.   Pulmonary:     Effort: Pulmonary effort is normal. No respiratory distress.     Breath sounds: Normal breath sounds. No wheezing.  Musculoskeletal:     Comments: Mild/moderate tenderness of midline spine in thoracic region.  Skin:    General: Skin is warm and dry.  Neurological:     Mental Status: She is alert and oriented to person, place, and time.      MD Evaluation Airway: WNL Heart: WNL Abdomen: WNL Chest/ Lungs: WNL ASA  Classification: 3 Mallampati/Airway Score: Two   Imaging: DG ESOPHAGUS W DOUBLE CM (HD)  Result Date: 03/06/2020 CLINICAL DATA:  Scleroderma. Dysphagia with pills sticking in the throat. EXAM: ESOPHOGRAM / BARIUM SWALLOW / BARIUM TABLET STUDY TECHNIQUE: Combined double contrast and single contrast examination performed using effervescent crystals, thick barium liquid, and thin barium liquid. The patient was observed with fluoroscopy swallowing a 13 mm barium sulphate tablet. FLUOROSCOPY TIME:  Fluoroscopy Time:  1 minutes 54 seconds Radiation Exposure Index (if provided by the fluoroscopic device): Sixty-four mGy Number of Acquired Spot Images: 8 COMPARISON:  None. FINDINGS: Normal oral and pharyngeal phases of swallowing, with no laryngeal penetration or tracheobronchial aspiration. No significant barium retention in the pharynx. No evidence of pharyngeal mass, stricture or diverticulum. No significant cricopharyngeus muscle dysfunction. Moderate to marked esophageal dysmotility characterized by weakening of primary peristalsis throughout the thoracic esophagus. No hiatal hernia. Moderate gastroesophageal reflux  elicited to the level of mid to upper thoracic esophagus with water siphon test. Granularity of the thoracic esophageal mucosa compatible with mild reflux esophagitis. Normal esophageal distensibility with no evidence of esophageal mass, ulcer or stricture. Barium tablet traversed the esophagus into the stomach without delay. IMPRESSION: 1. Moderate to marked esophageal dysmotility compatible with history of scleroderma. 2. No hiatal hernia.  Moderate gastroesophageal reflux elicited. 3. Mild reflux esophagitis. No evidence of esophageal mass or stricture. Electronically Signed   By: Ilona Sorrel M.D.   On: 03/06/2020 11:52    Labs:  CBC: Recent Labs    09/08/19 1547 10/20/19 1221 01/23/20 1447 01/30/20 1512  WBC 10.2 6.5 5.8 8.2  HGB 10.3* 10.0* 9.2* 10.2*  HCT 32.7* 32.0* 31.3*  29.9*  PLT 407 373 268 292    COAGS: Recent Labs    03/13/20 0847  INR 1.1    BMP: Recent Labs    08/31/19 1051 10/20/19 1221 01/23/20 1447 01/30/20 1512  NA 140 138 138 139  K 4.3 4.3 4.1 3.7  CL 108 105 106 108  CO2 23 21 21 23   GLUCOSE 99 87 85 108*  BUN 11 14 17 13   CALCIUM 8.9 9.1 9.1 8.1*  CREATININE 0.73 0.77 0.87 0.61  GFRNONAA >60 74 64 >60  GFRAA >60 86 74 >60    LIVER FUNCTION TESTS: Recent Labs    07/22/19 1244 08/31/19 1051 10/20/19 1221 01/23/20 1447  BILITOT <0.2 0.5 <0.2 <0.2  AST 17 16 15 12   ALT 9 10 8 6   ALKPHOS 49 50 63 66  PROT 6.8 7.4 6.9 6.8  ALBUMIN 4.1 3.9 4.0 4.3     Assessment and Plan:  T7 and T9 compression fractures, subacute in nature, seen on MR thoracic spine 02/10/2020. Plan for image-guided T7 and T9 kyphoplasty/vertebroplasty today in IR with Dr. Estanislado Pandy. Patient is NPO. Afebrile. She does not take blood thinners. INR 1.1 today. UA unremarkable today.  Risks and benefits of T7 and T9 kyphoplasty/vertebroplasty were discussed with the patient including, but not limited to education regarding the natural healing process of compression  fractures without intervention, bleeding, infection, cement migration which may cause spinal cord damage, paralysis, pulmonary embolism or even death. This interventional procedure involves the use of X-rays and because of the nature of the planned procedure, it is possible that we will have prolonged use of X-ray fluoroscopy. Potential radiation risks to you include (but are not limited to) the following: - A slightly elevated risk for cancer  several years later in life. This risk is typically less than 0.5% percent. This risk is low in comparison to the normal incidence of human cancer, which is 33% for women and 50% for men according to the [place]. - Radiation induced injury can include skin redness, resembling a rash, tissue breakdown / ulcers and hair loss (which can be temporary or permanent).  The likelihood of either of these occurring depends on the difficulty of the procedure and whether you are sensitive to radiation due to previous procedures, disease, or genetic conditions.  IF your procedure requires a prolonged use of radiation, you will be notified and given written instructions for further action.  It is your responsibility to monitor the irradiated area for the 2 weeks following the procedure and to notify your physician if you are concerned that you have suffered a radiation induced injury.   All of the patient's questions were answered, patient is agreeable to proceed. Consent signed and in chart.   Thank you for this interesting consult.  I greatly enjoyed meeting Haley Trevino and look forward to participating in their care.  A copy of this report was sent to the requesting provider on this date.  Electronically Signed: Earley Abide, PA-C 03/13/2020, 9:18 AM   I spent a total of 40 Minutes in face to face in clinical consultation, greater than 50% of which was counseling/coordinating care for T7 and T9 compression fractures/vetebral augmentation.

## 2020-03-13 NOTE — Procedures (Signed)
S/P T 9 and T 7 vertebroplasty. S.Bonner Larue MD

## 2020-03-13 NOTE — Discharge Instructions (Signed)
1.No stooping,or bending or lifting more than 10 lbs for 2 weeks. 2.Use walker to ambulate for 2 weeks. 3.No driving for 2 weeks  4.See PRN 2 weeks   Balloon Kyphoplasty, Care After This sheet gives you information about how to care for yourself after your procedure. Your health care provider may also give you more specific instructions. If you have problems or questions, contact your health care provider. What can I expect after the procedure? After your procedure, it is common to have back pain. Follow these instructions at home: Medicines  Take over-the-counter and prescription medicines only as told by your health care provider.  Ask your health care provider if the medicine prescribed to you: ? Requires you to avoid driving or using heavy machinery. ? Can cause constipation. You may need to take steps to prevent or treat constipation, such as:  Drink enough fluid to keep your urine pale yellow.  Take over-the-counter or prescription medicines.  Eat foods that are high in fiber, such as beans, whole grains, and fresh fruits and vegetables.  Limit foods that are high in fat and processed sugars, such as fried or sweet foods. Puncture site care   Follow instructions from your health care provider about how to take care of your puncture site. Make sure you: ? Wash your hands with soap and water before and after you change your bandage (dressing). If soap and water are not available, use hand sanitizer. ? Change your dressing as told by your health care provider. ? Leave skin glue or adhesive strips in place. These skin closures may need to be in place for 2 weeks or longer. If adhesive strip edges start to loosen and curl up, you may trim the loose edges. Do not remove adhesive strips completely unless your health care provider tells you to do that.  Check your puncture site every day for signs of infection. Watch for: ? Redness, swelling, or pain. ? Fluid or  blood. ? Warmth. ? Pus or a bad smell.  Keep your dressing dry until your health care provider says that it can be removed. Managing pain, stiffness, and swelling   If directed, put ice on the painful area. ? Put ice in a plastic bag. ? Place a towel between your skin and the bag. ? Leave the ice on for 20 minutes, 2-3 times a day. Activity  Rest your back and avoid intense physical activity for as long as told by your health care provider.  Avoid bending, lifting, or twisting your back for as long as told by your health care provider.  Return to your normal activities as told by your health care provider. Ask your health care provider what activities are safe for you.  Do not lift anything that is heavier than 5 lb (2.2 kg). You may need to avoid heavy lifting for several weeks. General instructions  Do not use any products that contain nicotine or tobacco, such as cigarettes, e-cigarettes, and chewing tobacco. These can delay bone healing. If you need help quitting, ask your health care provider.  Do not drive for 24 hours if you were given a sedative during your procedure.  Keep all follow-up visits as told by your health care provider. This is important. Contact a health care provider if:  You have a fever or chills.  You have redness, swelling, or pain at the site of your puncture.  You have fluid, blood, or pus coming from the puncture site.  You have pain that gets  worse or does not get better with medicine.  You develop numbness or weakness in any part of your body. Get help right away if:  You have chest pain.  You have difficulty breathing.  You have weakness, numbness, or tingling in your legs.  You cannot control your bladder or bowel movements.  You suddenly become weak or numb on one side of your body.  You become very confused.  You have trouble speaking or understanding, or both. Summary  Follow instructions from your health care provider about  how to take care of your puncture site.  Take over-the-counter and prescription medicines only as told by your health care provider.  Rest your back and avoid intense physical activity for as long as told by your health care provider.  Contact a health care provider if you have pain that gets worse or does not get better with medicine.  Keep all follow-up visits as told by your health care provider. This is important. This information is not intended to replace advice given to you by your health care provider. Make sure you discuss any questions you have with your health care provider. Document Revised: 05/31/2018 Document Reviewed: 05/31/2018 Elsevier Patient Education  [place].       Moderate Conscious Sedation, Adult, Care After These instructions provide you with information about caring for yourself after your procedure. Your health care provider may also give you more specific instructions. Your treatment has been planned according to current medical practices, but problems sometimes occur. Call your health care provider if you have any problems or questions after your procedure. What can I expect after the procedure? After your procedure, it is common:  To feel sleepy for several hours.  To feel clumsy and have poor balance for several hours.  To have poor judgment for several hours.  To vomit if you eat too soon. Follow these instructions at home: For at least 24 hours after the procedure:   Do not: ? Participate in activities where you could fall or become injured. ? Drive. ? Use heavy machinery. ? Drink alcohol. ? Take sleeping pills or medicines that cause drowsiness. ? Make important decisions or sign legal documents. ? Take care of children on your own.  Rest. Eating and drinking  Follow the diet recommended by your health care provider.  If you vomit: ? Drink water, juice, or soup when you can drink without vomiting. ? Make sure you have  little or no nausea before eating solid foods. General instructions  Have a responsible adult stay with you until you are awake and alert.  Take over-the-counter and prescription medicines only as told by your health care provider.  If you smoke, do not smoke without supervision.  Keep all follow-up visits as told by your health care provider. This is important. Contact a health care provider if:  You keep feeling nauseous or you keep vomiting.  You feel light-headed.  You develop a rash.  You have a fever. Get help right away if:  You have trouble breathing. This information is not intended to replace advice given to you by your health care provider. Make sure you discuss any questions you have with your health care provider. Document Revised: 06/05/2017 Document Reviewed: 10/13/2015 Elsevier Patient Education  2020 [employer].

## 2020-03-14 HISTORY — PX: IR VERTEBROPLASTY EA ADDL (T&LS) BX INC UNI/BIL INC INJECT/IMAGING: IMG5517

## 2020-03-27 ENCOUNTER — Encounter (HOSPITAL_COMMUNITY): Payer: Self-pay

## 2020-04-11 ENCOUNTER — Encounter: Payer: Self-pay | Admitting: Orthopaedic Surgery

## 2020-04-11 ENCOUNTER — Other Ambulatory Visit: Payer: Self-pay

## 2020-04-11 ENCOUNTER — Ambulatory Visit (INDEPENDENT_AMBULATORY_CARE_PROVIDER_SITE_OTHER): Payer: Medicare Other | Admitting: Orthopaedic Surgery

## 2020-04-11 ENCOUNTER — Ambulatory Visit: Payer: Self-pay

## 2020-04-11 VITALS — Ht 62.0 in | Wt 102.0 lb

## 2020-04-11 DIAGNOSIS — M25511 Pain in right shoulder: Secondary | ICD-10-CM | POA: Insufficient documentation

## 2020-04-11 DIAGNOSIS — G8929 Other chronic pain: Secondary | ICD-10-CM | POA: Diagnosis not present

## 2020-04-11 MED ORDER — LIDOCAINE HCL 2 % IJ SOLN
2.0000 mL | INTRAMUSCULAR | Status: AC | PRN
  Administered 2020-04-11: 2 mL

## 2020-04-11 MED ORDER — BUPIVACAINE HCL 0.5 % IJ SOLN
2.0000 mL | INTRAMUSCULAR | Status: AC | PRN
  Administered 2020-04-11: 2 mL via INTRA_ARTICULAR

## 2020-04-11 MED ORDER — METHYLPREDNISOLONE ACETATE 40 MG/ML IJ SUSP
80.0000 mg | INTRAMUSCULAR | Status: AC | PRN
  Administered 2020-04-11: 80 mg via INTRA_ARTICULAR

## 2020-04-11 NOTE — Progress Notes (Signed)
Office Visit Note   Patient: Haley Trevino           Date of Birth: Oct 23, 1940           MRN: [id number] Visit Date: 04/11/2020              Requested by: [name], [address] [place],  [place] [address] PCP: [name], FNP   Assessment & Plan: Visit Diagnoses:  1. Chronic right shoulder pain     Plan: Impingement syndrome right shoulder.  I discussed the situation with Mrs. Zachow and suggested a subacromial cortisone injection and monitor response.  Would consider an MRI scan if she does not do well. Has a chronic problem with her left shoulder and has been told she needs a shoulder replacement.  She asked if I would get involved and I told her that I did not perform the surgery but would have Dr. Marlou Sa evaluate her if she wanted me to make the referral.  We have to get her permission to get her scans and prior evaluations  Follow-Up Instructions: Return if symptoms worsen or fail to improve.   Orders:  Orders Placed This Encounter  Procedures  . Large Joint Inj: R subacromial bursa  . XR Shoulder Right   No orders of the defined types were placed in this encounter.     Procedures: Large Joint Inj: R subacromial bursa on 04/11/2020 1:49 PM Indications: pain and diagnostic evaluation Details: 25 G 1.5 in needle, anterolateral approach  Arthrogram: No  Medications: 2 mL lidocaine 2 %; 2 mL bupivacaine 0.5 %; 80 mg methylPREDNISolone acetate 40 MG/ML Consent was given by the patient. Immediately prior to procedure a time out was called to verify the correct patient, procedure, equipment, support staff and site/side marked as required. Patient was prepped and draped in the usual sterile fashion.       Clinical Data: No additional findings.   Subjective: Chief Complaint  Patient presents with  . Right Shoulder - Pain  . Chest - Pain    Rib pain  Patient presents today for multiple complaints. She is having right shoulder  pain and bilateral rib pain. She states that her right shoulder has hurt for at least 56months. No known injury. She said that it hurts all throughout and radiates into her arm. No numbness or tingling, but does report some weakness in her arms. She is right hand dominant. She also states that she has fractured her ribs on both sides around 66months ago. She thought they had healed, but she still has pain bilaterally when inhaling. She does take Ibuprofen for pain if needed. She states that she has osteoporosis and is taking prolia injections. She also wanted [place] to know that she has been evaluated elesewhere for her left shoulder and has been told she needs a replacement. She has already consulted with a surgeon, but had to hold off due to caring for her husband who is currently battling cancer. Has been evaluated by a cardiologist without any obvious pathology in regards to her chest pain and "rib pain". Taking Prolia for osteoporosis  HPI  Review of Systems   Objective: Vital Signs: Ht 5\' 2"  (1.575 m)   Wt 102 lb (46.3 kg)   BMI 18.66 kg/m   Physical Exam Constitutional:      Appearance: She is well-developed.  Eyes:     Pupils: Pupils are equal, round, and reactive to light.  Pulmonary:     Effort: Pulmonary  effort is normal.  Skin:    General: Skin is warm and dry.  Neurological:     Mental Status: She is alert and oriented to person, place, and time.  Psychiatric:        Behavior: Behavior normal.     Ortho Exam awake alert and oriented x3.  Comfortable sitting.  Very minimal impingement testing.  Appears to have good strength.  Negative empty can and Speed sign.  Biceps appears to be intact.  No localized tenderness at the [facility] joint or the subacromial region.  No grating or crepitation.  Has excellent range of motion of cervical spine with only minimal loss of neck extension but with negative L'hermitte"s  Specialty Comments:  No specialty comments available.  Imaging: XR  Shoulder Right  Result Date: 04/11/2020 Films of the right shoulder tendon several projections.  The humeral head is centered about the glenoid.  There is no elevation of the humeral head.  Normal space between the humeral head and the acromium.  No acute changes.  No ectopic calcification.  Mild degenerative changes at the [facility] joint.  There is some calcification about the aorta.  Prior kyphoplasty of looks like T7 and T9.  No obvious rib pathology or right lung abnormalities    PMFS History: Patient Active Problem List   Diagnosis Date Noted  . Pain in right shoulder 04/11/2020  . Murmur 11/18/2017  . Impingement syndrome of left shoulder region 12/08/2014  . Osteoporosis 09/20/2014  . Hip fracture requiring operative repair ([place]) 02/22/2014  . Hypothyroidism 11/29/2013  . Depression 11/29/2013  . GERD (gastroesophageal reflux disease) 11/29/2013  . Hypertension 11/29/2013  . Hyperlipidemia with target LDL less than 100 11/29/2013  . Thyroid cyst 04/26/2013  . MALIGNANT MELANOMA, SKIN 08/07/2010  . [name] 08/06/2010  . SCLERODERMA 08/06/2010  . Osteoarthritis 08/06/2010   Past Medical History:  Diagnosis Date  . Cataract   . Dyspnea   . Osteoarthritis   . Raynaud disease   . Scleroderma ([place])   . Thyroid disease    hypothyroidism    Family History  Problem Relation Age of Onset  . Pancreatic cancer Father   . Raynaud syndrome Father   . Cancer Father        pancreatic  . Rashes / Skin problems Daughter        possibly scleraderma  . Hip fracture Mother   . Mental illness Mother        attempted suicide at 4 yo    Past Surgical History:  Procedure Laterality Date  . BREAST ENHANCEMENT SURGERY  1975  . BREAST IMPLANT REMOVAL Bilateral 04/23/2016   Procedure: REMOVALBILATERAL BREAST IMPLANTS;  Surgeon: Wallace Going, DO;  Location: [facility];  Service: Plastics;  Laterality: Bilateral;  . CHOLECYSTECTOMY  1973  . HAND SURGERY   08/2012; 11/2012  . IR RADIOLOGIST EVAL & MGMT  07/27/2017  . IR SACROPLASTY BILATERAL  07/31/2017  . IR VERTEBROPLASTY CERV/THOR BX INC UNI/BIL INC/INJECT/IMAGING  03/13/2020  . IR VERTEBROPLASTY EA ADDL (T&LS) BX INC UNI/BIL INC INJECT/IMAGING  03/14/2020  . MELANOMA EXCISION     at 79 yrs of age  . ORIF HIP FRACTURE Right 02/22/2014   Procedure: OPEN REDUCTION INTERNAL FIXATION RIGHT HIP;  Surgeon: Sanjuana Kava, MD;  Location: [facility];  Service: Orthopedics;  Laterality: Right;  . TOTAL ABDOMINAL HYSTERECTOMY  1974   Social History   Occupational History  . Occupation: retired Occupational psychologist at [employer]  Tobacco Use  .  Smoking status: Never Smoker  . Smokeless tobacco: Never Used  . Tobacco comment: passive tobacco smoke exposure as child  Vaping Use  . Vaping Use: Never used  Substance and Sexual Activity  . Alcohol use: No  . Drug use: No  . Sexual activity: Not Currently    Birth control/protection: Surgical

## 2020-04-12 ENCOUNTER — Telehealth: Payer: Medicare Other

## 2020-04-13 ENCOUNTER — Telehealth: Payer: Self-pay

## 2020-04-13 NOTE — Telephone Encounter (Signed)
This was ordered 9/2.  It's in there.  Will cc to [facility] to make sure it gets scheduled

## 2020-04-13 NOTE — Telephone Encounter (Signed)
Dr. Darnell Level,  This is a MMM patient, there is a telephone note on 03/08/20 where she discusses ordering a renal scan but I do not see where this has been done.  Can you order this please?

## 2020-04-13 NOTE — Telephone Encounter (Signed)
Pt called wanting to be updated on status of getting her scheduled for Renal Scan. Please call pt with info.

## 2020-04-16 ENCOUNTER — Telehealth: Payer: Self-pay

## 2020-04-16 NOTE — Telephone Encounter (Signed)
  Prescription Request  04/16/2020  What is the name of the medication or equipment? cloNIDine (CATAPRES) 0.1 MG tablet  Have you contacted your pharmacy to request a refill? (if applicable) yes  Which pharmacy would you like this sent to? [facility]   Patient notified that their request is being sent to the clinical staff for review and that they should receive a response within 2 business days.

## 2020-04-16 NOTE — Telephone Encounter (Signed)
Pt states she was told to take 2 tabs BID Please advise she is out

## 2020-04-17 ENCOUNTER — Telehealth: Payer: Self-pay

## 2020-04-17 ENCOUNTER — Other Ambulatory Visit: Payer: Self-pay | Admitting: Family Medicine

## 2020-04-17 MED ORDER — CLONIDINE HCL 0.2 MG PO TABS
0.2000 mg | ORAL_TABLET | Freq: Two times a day (BID) | ORAL | 0 refills | Status: DC
Start: 2020-04-17 — End: 2020-05-14

## 2020-04-17 NOTE — Telephone Encounter (Signed)
Spoke with patient - states that every morning her bp runs high but it always coming down later in the day.  BP this morning was 189/100 at 7am & 153/89 at 9am.  States she is getting a Renal test Monday at [facility] due to her BP going up and down.  Patient states that MMM told her to increase her clondine to take 2 tablets BID.  She is requesting a new rx sent to the pharmacy with correct instructions since she is out of medication. If approved send to [facility].  Covering pcp- please advise

## 2020-04-17 NOTE — Telephone Encounter (Signed)
Pt aware.

## 2020-04-17 NOTE — Telephone Encounter (Signed)
1 month supply of increased dose has been sent to the pharmacy.  Will defer to PCP for ongoing adjustment of this medication.  May benefit from higher dose.  Please note to the patient that I have adjusted the dose to 0.2 mg twice daily of the clonidine.  She NO longer needs to take 2 at one time.

## 2020-04-23 ENCOUNTER — Other Ambulatory Visit: Payer: Self-pay

## 2020-04-23 ENCOUNTER — Ambulatory Visit (HOSPITAL_COMMUNITY): Admission: RE | Admit: 2020-04-23 | Payer: Medicare Other | Source: Ambulatory Visit

## 2020-04-23 ENCOUNTER — Encounter (HOSPITAL_COMMUNITY): Payer: Self-pay

## 2020-04-23 ENCOUNTER — Ambulatory Visit (HOSPITAL_COMMUNITY)
Admission: RE | Admit: 2020-04-23 | Discharge: 2020-04-23 | Disposition: A | Discharge status: Home / Self Care | Payer: Medicare Other | Source: Ambulatory Visit | Attending: Nurse Practitioner | Admitting: Nurse Practitioner

## 2020-04-23 DIAGNOSIS — I129 Hypertensive chronic kidney disease with stage 1 through stage 4 chronic kidney disease, or unspecified chronic kidney disease: Secondary | ICD-10-CM

## 2020-04-23 DIAGNOSIS — I10 Essential (primary) hypertension: Secondary | ICD-10-CM

## 2020-04-25 ENCOUNTER — Telehealth: Payer: Self-pay

## 2020-04-25 NOTE — Telephone Encounter (Signed)
She needs renal artery  study due  to her blood pressure- see note form radiology

## 2020-04-25 NOTE — Telephone Encounter (Signed)
Lady from Radiology Dept from [facility] says that they received order for renal US. Says regular renal US can be done at [facility] but Renal Artery Duplex has to be sent to Vascular Lab at [facility].

## 2020-04-26 ENCOUNTER — Other Ambulatory Visit (HOSPITAL_COMMUNITY): Payer: Self-pay | Admitting: Vascular Surgery

## 2020-04-26 ENCOUNTER — Ambulatory Visit (INDEPENDENT_AMBULATORY_CARE_PROVIDER_SITE_OTHER): Payer: Medicare Other

## 2020-04-26 DIAGNOSIS — Z0000 Encounter for general adult medical examination without abnormal findings: Secondary | ICD-10-CM | POA: Diagnosis not present

## 2020-04-26 NOTE — Progress Notes (Signed)
MEDICARE ANNUAL WELLNESS VISIT  04/26/2020  Telephone Visit Disclaimer This Medicare AWV was conducted by telephone due to national recommendations for restrictions regarding the COVID-19 Pandemic (e.g. social distancing).  I verified, using two identifiers, that I am speaking with Haley Trevino or their authorized healthcare agent. I discussed the limitations, risks, security, and privacy concerns of performing an evaluation and management service by telephone and the potential availability of an in-person appointment in the future. The patient expressed understanding and agreed to proceed.  Location of Patient: Home Location of Provider (nurse):  [facility]  Subjective:    Haley Trevino is a 79 y.o. female patient of [name], [place] who had a Medicare Annual Wellness Visit today via telephone. Haley Trevino is Retired and lives with their spouse. She has one son who has passed away and one daughter who is still living. She reports that she is socially active and does interact with friends/family regularly. She is minimally physically active and enjoys working puzzles in her spare time.  Patient Care Team: [name], [place] as PCP - General (Nurse Practitioner) Clarene Essex, MD as Consulting Physician (Gastroenterology) Philemon Kingdom, MD as Consulting Physician (Internal Medicine) Hennie Duos, MD as Consulting Physician (Rheumatology) Peggyann Juba, MD as Referring Physician (Orthopedic Surgery) Artis Delay, MD as Referring Physician (Internal Medicine) Justice Britain, MD as Consulting Physician (Orthopedic Surgery) Sanjuana Kava, MD as Consulting Physician (Orthopedic Surgery) Barrett, Felisa Bonier as Physician Assistant (Cardiology) Katha Cabal, LCSW as [place] Management (Licensed Clinical Social Worker)  Advanced Directives 04/26/2020 03/13/2020 01/30/2020 12/27/2019 08/31/2019 04/26/2019 04/20/2018  Does Patient Have a Medical  Advance Directive? No No No No No Yes No  Type of Advance Directive - - - - - Press photographer;Living will -  Does patient want to make changes to medical advance directive? - - - - - No - Patient declined -  Copy of [place] in Chart? - - - - - No - copy requested -  Would patient like information on creating a medical advance directive? No - Patient declined No - Patient declined - - - - Yes (MAU/Ambulatory/Procedural Areas - Information given)    Hospital Utilization Over the Past 12 Months: # of hospitalizations or ER visits: 2 # of surgeries: 1  Review of Systems    Patient reports that her overall health is worse compared to last year.  History obtained from chart review and the patient  Patient Reported Readings (BP, Pulse, CBG, Weight, etc) none  Pain Assessment Pain : 0-10 Pain Score: 4  Pain Location: Rib cage Pain Orientation: Left Pain Descriptors / Indicators: Aching, Nagging Pain Onset: More than a month ago Pain Frequency: Occasional     Current Medications & Allergies (verified) Allergies as of 04/26/2020      Reactions   Dilaudid [hydromorphone Hcl] Anaphylaxis   Acyclovir And Related    Unknown reaction   Crestor [rosuvastatin Calcium] Other (See Comments)   Muscle pain   Gabapentin Swelling   Swelling in feet, and in legs   Aleve [naproxen Sodium] Rash   Lyrica [pregabalin] Other (See Comments)   Swelling in feet, and in legs      Medication List       Accurate as of April 26, 2020  2:45 PM. If you have any questions, ask your nurse or doctor.        acetaminophen 500 MG tablet Commonly known as: TYLENOL Take 1,000 mg  by mouth every 6 (six) hours as needed for moderate pain or headache.   clonazePAM 0.5 MG tablet Commonly known as: KLONOPIN Take 1 tablet (0.5 mg total) by mouth 2 (two) times daily as needed for anxiety. What changed: when to take this   cloNIDine 0.2 MG tablet Commonly known as:  Catapres Take 1 tablet (0.2 mg total) by mouth 2 (two) times daily. PLEASE NOTE CHANGE IN DOSE   ezetimibe 10 MG tablet Commonly known as: Zetia Take 1 tablet (10 mg total) by mouth daily.   ferrous sulfate 325 (65 FE) MG tablet Take 1 tablet (325 mg total) by mouth 2 (two) times daily with a meal.   FLUoxetine 40 MG capsule Commonly known as: PROZAC Take 1 capsule (40 mg total) by mouth daily.   lisinopril 40 MG tablet Commonly known as: ZESTRIL Take 1 tablet (40 mg total) by mouth daily.   NIFEdipine 60 MG 24 hr tablet Commonly known as: PROCARDIA XL/NIFEDICAL XL Take 1 tablet (60 mg total) by mouth daily.   omeprazole 20 MG capsule Commonly known as: PRILOSEC TAKE 1 TABLET DAILY What changed:   how much to take  how to take this  when to take this  additional instructions   Pepto-Bismol 262 MG Tabs Generic drug: Bismuth Subsalicylate Take 914 mg by mouth daily as needed (indigestion).   [facility] Colon Health Caps Take 1 capsule by mouth daily.   [place]ips Milk of Magnesia 311 MG Chew chewable tablet Generic drug: magnesium hydroxide Chew 311 mg by mouth every 4 (four) hours as needed (indigestion).   Prolia 60 MG/ML Sosy injection Generic drug: denosumab Inject 60 mg into the skin every 6 (six) months.   sildenafil 20 MG tablet Commonly known as: REVATIO Take 1 tablet (20 mg total) by mouth 3 (three) times daily. What changed: when to take this   Synthroid 75 MCG tablet Generic drug: levothyroxine Take 1 tablet (75 mcg total) by mouth daily before breakfast.   trolamine salicylate 10 % cream Commonly known as: ASPERCREME Apply 1 application topically as needed (knee pain).   Vitamin D3 50 MCG (2000 UT) Tabs Take 2,000 Units by mouth daily.       History (reviewed): Past Medical History:  Diagnosis Date  . Cataract   . Dyspnea   . Osteoarthritis   . Raynaud disease   . Scleroderma ([place])   . Thyroid disease    hypothyroidism   Past  Surgical History:  Procedure Laterality Date  . BREAST ENHANCEMENT SURGERY  1975  . BREAST IMPLANT REMOVAL Bilateral 04/23/2016   Procedure: REMOVALBILATERAL BREAST IMPLANTS;  Surgeon: Wallace Going, DO;  Location: [facility];  Service: Plastics;  Laterality: Bilateral;  . CHOLECYSTECTOMY  1973  . HAND SURGERY  08/2012; 11/2012  . IR RADIOLOGIST EVAL & MGMT  07/27/2017  . IR SACROPLASTY BILATERAL  07/31/2017  . IR VERTEBROPLASTY CERV/THOR BX INC UNI/BIL INC/INJECT/IMAGING  03/13/2020  . IR VERTEBROPLASTY EA ADDL (T&LS) BX INC UNI/BIL INC INJECT/IMAGING  03/14/2020  . MELANOMA EXCISION     at 19 yrs of age  . ORIF HIP FRACTURE Right 02/22/2014   Procedure: OPEN REDUCTION INTERNAL FIXATION RIGHT HIP;  Surgeon: Sanjuana Kava, MD;  Location: [facility];  Service: Orthopedics;  Laterality: Right;  . TOTAL ABDOMINAL HYSTERECTOMY  1974   Family History  Problem Relation Age of Onset  . Pancreatic cancer Father   . Raynaud syndrome Father   . Cancer Father  pancreatic  . Rashes / Skin problems Daughter        possibly scleraderma  . Hip fracture Mother   . Mental illness Mother        attempted suicide at 35 yo   Social History   Socioeconomic History  . Marital status: Married    Spouse name: Not on file  . Number of children: 2  . Years of education: Not on file  . Highest education level: Some college, no degree  Occupational History  . Occupation: retired Occupational psychologist at [employer]  Tobacco Use  . Smoking status: Never Smoker  . Smokeless tobacco: Never Used  . Tobacco comment: passive tobacco smoke exposure as child  Vaping Use  . Vaping Use: Never used  Substance and Sexual Activity  . Alcohol use: No  . Drug use: No  . Sexual activity: Not Currently    Birth control/protection: Surgical  Other Topics Concern  . Not on file  Social History Narrative   Regular exercise: walking   Caffeine use: 1 cup of coffee daily   Social Determinants of Health    Financial Resource Strain:   . Difficulty of Paying Living Expenses: Not on file  Food Insecurity:   . Worried About Charity fundraiser in the Last Year: Not on file  . Ran Out of Food in the Last Year: Not on file  Transportation Needs:   . Lack of Transportation (Medical): Not on file  . Lack of Transportation (Non-Medical): Not on file  Physical Activity:   . Days of Exercise per Week: Not on file  . Minutes of Exercise per Session: Not on file  Stress:   . Feeling of Stress : Not on file  Social Connections:   . Frequency of Communication with Friends and Family: Not on file  . Frequency of Social Gatherings with Friends and Family: Not on file  . Attends Religious Services: Not on file  . Active Member of Clubs or Organizations: Not on file  . Attends Archivist Meetings: Not on file  . Marital Status: Not on file    Activities of Daily Living In your present state of health, do you have any difficulty performing the following activities: 04/26/2020  Hearing? N  Vision? N  Difficulty concentrating or making decisions? N  Walking or climbing stairs? N  Dressing or bathing? N  Doing errands, shopping? N  Preparing Food and eating ? N  Using the Toilet? N  In the past six months, have you accidently leaked urine? N  Do you have problems with loss of bowel control? N  Managing your Medications? N  Managing your Finances? N  Housekeeping or managing your Housekeeping? N  Some recent data might be hidden    Patient Education/ Literacy How often do you need to have someone help you when you read instructions, pamphlets, or other written materials from your doctor or pharmacy?: 1 - Never What is the last grade level you completed in school?: 12th grade  Exercise Current Exercise Habits: The patient does not participate in regular exercise at present, Exercise limited by: None identified  Diet Patient reports consuming three meals a day and 1 snack(s) a  day Patient reports that her primary diet is: Regular Patient reports that she does have regular access to food.   Depression Screen PHQ 2/9 Scores 04/26/2020 02/20/2020 01/23/2020 12/20/2019 12/02/2019 10/24/2019 10/20/2019  PHQ - 2 Score 0 0 0 0 1 2 0  PHQ- 9 Score - - - -  5 10 -  Exception Documentation - - - - - - -     Fall Risk Fall Risk  04/26/2020 02/20/2020 01/23/2020 12/20/2019 12/02/2019  Falls in the past year? 1 0 0 1 1  Number falls in past yr: 0 - - 0 0  Injury with Fall? 1 - - 1 0  Comment fractured ribs - - Broken ribs -  Risk Factor Category  - - - - -  Risk for fall due to : History of fall(s) - - History of fall(s) History of fall(s)  Follow up Falls evaluation completed - - Falls prevention discussed Falls evaluation completed  Comment - - - - -     Objective:  Haley Trevino seemed alert and oriented and she participated appropriately during our telephone visit.  Blood Pressure Weight BMI  BP Readings from Last 3 Encounters:  03/13/20 (!) 118/44  02/20/20 134/69  01/30/20 (!) 173/73   Wt Readings from Last 3 Encounters:  04/11/20 102 lb (46.3 kg)  02/20/20 103 lb (46.7 kg)  01/30/20 104 lb (47.2 kg)   BMI Readings from Last 1 Encounters:  04/11/20 18.66 kg/m    *Unable to obtain current vital signs, weight, and BMI due to telephone visit type  Hearing/Vision  . Haley Trevino did not seem to have difficulty with hearing/understanding during the telephone conversation . Reports that she has had a formal eye exam by an eye care professional within the past year . Reports that she has not had a formal hearing evaluation within the past year *Unable to fully assess hearing and vision during telephone visit type  Cognitive Function: 6CIT Screen 04/26/2020 04/26/2019  What Year? 0 points 0 points  What month? 0 points 0 points  What time? 0 points 0 points  Count back from 20 0 points 0 points  Months in reverse 0 points 0 points  Repeat phrase 0 points 0  points  Total Score 0 0   (Normal:0-7, Significant for Dysfunction: >8)  Normal Cognitive Function Screening: Yes   Immunization & Health Maintenance Record Immunization History  Administered Date(s) Administered  . Fluad Quad(high Dose 65+) 04/14/2019  . Influenza, High Dose Seasonal PF 04/08/2018  . Influenza,inj,Quad PF,6+ Mos 04/06/2013, 04/19/2014, 04/19/2015, 03/25/2016, 04/02/2017  . Influenza-Unspecified 04/19/2014  . Moderna SARS-COVID-2 Vaccination 08/18/2019, 09/16/2019  . Pneumococcal Conjugate-13 06/02/2014  . Pneumococcal Polysaccharide-23 06/25/2011, 04/06/2014  . Tdap 07/07/2005, 04/17/2006, 03/26/2017  . Zoster 08/06/2009  . Zoster Recombinat (Shingrix) 04/20/2018, 06/23/2018    Health Maintenance  Topic Date Due  . COLONOSCOPY  06/23/2018  . DEXA SCAN  10/10/2018  . INFLUENZA VACCINE  02/05/2020  . Hepatitis C Screening  01/22/2021 (Originally Nov 05, 1940)  . TETANUS/TDAP  03/27/2027  . COVID-19 Vaccine  Completed  . PNA vac Low Risk Adult  Completed       Assessment  This is a routine wellness examination for Haley Trevino.  Health Maintenance: Due or Overdue Health Maintenance Due  Topic Date Due  . COLONOSCOPY  06/23/2018  . DEXA SCAN  10/10/2018  . INFLUENZA VACCINE  02/05/2020    Haley Trevino does not need a referral for Community Assistance: Care Management:   no Social Work:    no Prescription Assistance:  no Nutrition/Diabetes Education:  no   Plan:  Personalized Goals Goals Addressed            This Visit's Progress   . Patient Stated       04/26/2020 AWV Goal: Exercise for General  Health   Patient will verbalize understanding of the benefits of increased physical activity:  Exercising regularly is important. It will improve your overall fitness, flexibility, and endurance.  Regular exercise also will improve your overall health. It can help you control your weight, reduce stress, and improve your bone  density.  Over the next year, patient will increase physical activity as tolerated with a goal of at least 150 minutes of moderate physical activity per week.   You can tell that you are exercising at a moderate intensity if your heart starts beating faster and you start breathing faster but can still hold a conversation.  Moderate-intensity exercise ideas include:  Walking 1 mile (1.6 km) in about 15 minutes  Biking  Hiking  Golfing  Dancing  Water aerobics  Patient will verbalize understanding of everyday activities that increase physical activity by providing examples like the following: ? Yard work, such as: ? Pushing a Conservation officer, nature ? Raking and bagging leaves ? Washing your car ? Pushing a stroller ? Shoveling snow ? Gardening ? Washing windows or floors  Patient will be able to explain general safety guidelines for exercising:   Before you start a new exercise program, talk with your health care provider.  Do not exercise so much that you hurt yourself, feel dizzy, or get very short of breath.  Wear comfortable clothes and wear shoes with good support.  Drink plenty of water while you exercise to prevent dehydration or heat stroke.  Work out until your breathing and your heartbeat get faster.       Personalized Health Maintenance & Screening Recommendations  Bone densitometry screening  Lung Cancer Screening Recommended: no (Low Dose CT Chest recommended if Age 59-80 years, 30 pack-year currently smoking OR have quit w/in past 15 years) Hepatitis C Screening recommended: no HIV Screening recommended: no  Advanced Directives: Written information was not prepared per patient's request.  Referrals & Orders No orders of the defined types were placed in this encounter.   Follow-up Plan . Follow-up with [name], FNP as planned . Schedule Dexa scan    I have personally reviewed and noted the following in the patient's chart:   . Medical and  social history . Use of alcohol, tobacco or illicit drugs  . Current medications and supplements . Functional ability and status . Nutritional status . Physical activity . Advanced directives . List of other physicians . Hospitalizations, surgeries, and ER visits in previous 12 months . Vitals . Screenings to include cognitive, depression, and falls . Referrals and appointments  In addition, I have reviewed and discussed with Haley Trevino certain preventive protocols, quality metrics, and best practice recommendations. A written personalized care plan for preventive services as well as general preventive health recommendations is available and can be mailed to the patient at her request.      Felicity Coyer, LPN    [id number]   Patient declined after visit summary.

## 2020-04-27 ENCOUNTER — Other Ambulatory Visit: Payer: Self-pay | Admitting: Nurse Practitioner

## 2020-04-27 ENCOUNTER — Telehealth: Payer: Self-pay | Admitting: Nurse Practitioner

## 2020-04-27 DIAGNOSIS — I10 Essential (primary) hypertension: Secondary | ICD-10-CM

## 2020-04-27 NOTE — Progress Notes (Signed)
[id number] °

## 2020-05-04 ENCOUNTER — Ambulatory Visit (HOSPITAL_COMMUNITY)
Admission: RE | Admit: 2020-05-04 | Discharge: 2020-05-04 | Disposition: A | Discharge status: Home / Self Care | Payer: Medicare Other | Source: Ambulatory Visit | Attending: Nurse Practitioner | Admitting: Nurse Practitioner

## 2020-05-04 ENCOUNTER — Other Ambulatory Visit: Payer: Self-pay

## 2020-05-04 DIAGNOSIS — I10 Essential (primary) hypertension: Secondary | ICD-10-CM | POA: Insufficient documentation

## 2020-05-04 NOTE — Progress Notes (Signed)
Renal artery duplex completed. Refer to "CV Proc" under chart review to view preliminary results.  05/04/2020 9:31 AM Kelby Aline., MHA, RVT, RDCS, RDMS

## 2020-05-14 ENCOUNTER — Other Ambulatory Visit: Payer: Self-pay

## 2020-05-14 ENCOUNTER — Encounter: Payer: Self-pay | Admitting: Nurse Practitioner

## 2020-05-14 ENCOUNTER — Ambulatory Visit (INDEPENDENT_AMBULATORY_CARE_PROVIDER_SITE_OTHER): Payer: Medicare Other | Admitting: Nurse Practitioner

## 2020-05-14 VITALS — BP 127/58 | HR 75 | Temp 98.1°F | Resp 20 | Ht 62.0 in | Wt 109.0 lb

## 2020-05-14 DIAGNOSIS — K219 Gastro-esophageal reflux disease without esophagitis: Secondary | ICD-10-CM | POA: Diagnosis not present

## 2020-05-14 DIAGNOSIS — M810 Age-related osteoporosis without current pathological fracture: Secondary | ICD-10-CM

## 2020-05-14 DIAGNOSIS — Z23 Encounter for immunization: Secondary | ICD-10-CM

## 2020-05-14 DIAGNOSIS — M546 Pain in thoracic spine: Secondary | ICD-10-CM

## 2020-05-14 DIAGNOSIS — D509 Iron deficiency anemia, unspecified: Secondary | ICD-10-CM

## 2020-05-14 DIAGNOSIS — E039 Hypothyroidism, unspecified: Secondary | ICD-10-CM | POA: Diagnosis not present

## 2020-05-14 DIAGNOSIS — I7301 Raynaud's syndrome with gangrene: Secondary | ICD-10-CM

## 2020-05-14 DIAGNOSIS — I10 Essential (primary) hypertension: Secondary | ICD-10-CM | POA: Diagnosis not present

## 2020-05-14 DIAGNOSIS — M349 Systemic sclerosis, unspecified: Secondary | ICD-10-CM | POA: Diagnosis not present

## 2020-05-14 DIAGNOSIS — F419 Anxiety disorder, unspecified: Secondary | ICD-10-CM | POA: Diagnosis not present

## 2020-05-14 DIAGNOSIS — F3342 Major depressive disorder, recurrent, in full remission: Secondary | ICD-10-CM

## 2020-05-14 DIAGNOSIS — G8929 Other chronic pain: Secondary | ICD-10-CM | POA: Diagnosis not present

## 2020-05-14 DIAGNOSIS — E785 Hyperlipidemia, unspecified: Secondary | ICD-10-CM | POA: Diagnosis not present

## 2020-05-14 MED ORDER — CLONIDINE HCL 0.2 MG PO TABS: 0.2000 mg | ORAL_TABLET | Freq: Two times a day (BID) | ORAL | 0 refills | Status: DC

## 2020-05-14 MED ORDER — CLONAZEPAM 0.5 MG PO TABS: 0.5000 mg | ORAL_TABLET | Freq: Two times a day (BID) | ORAL | 2 refills | Status: DC | PRN

## 2020-05-14 MED ORDER — NIFEDIPINE ER OSMOTIC RELEASE 60 MG PO TB24: 60.0000 mg | ORAL_TABLET | Freq: Every day | ORAL | 1 refills | Status: DC

## 2020-05-14 MED ORDER — FLUOXETINE HCL 40 MG PO CAPS: 40.0000 mg | ORAL_CAPSULE | Freq: Every day | ORAL | 3 refills | Status: DC

## 2020-05-14 MED ORDER — FERROUS SULFATE 325 (65 FE) MG PO TABS: 325.0000 mg | ORAL_TABLET | Freq: Two times a day (BID) | ORAL | 1 refills | Status: DC

## 2020-05-14 MED ORDER — SILDENAFIL CITRATE 20 MG PO TABS: 20.0000 mg | ORAL_TABLET | Freq: Two times a day (BID) | ORAL | 1 refills | Status: DC

## 2020-05-14 MED ORDER — PREDNISONE 10 MG (21) PO TBPK: ORAL_TABLET | ORAL | 0 refills | Status: DC

## 2020-05-14 MED ORDER — OMEPRAZOLE 20 MG PO CPDR: DELAYED_RELEASE_CAPSULE | ORAL | 1 refills | Status: DC

## 2020-05-14 MED ORDER — EZETIMIBE 10 MG PO TABS: 10.0000 mg | ORAL_TABLET | Freq: Every day | ORAL | 3 refills | Status: DC

## 2020-05-14 MED ORDER — LISINOPRIL 40 MG PO TABS: 40.0000 mg | ORAL_TABLET | Freq: Every day | ORAL | 1 refills | Status: DC

## 2020-05-14 MED ORDER — SYNTHROID 75 MCG PO TABS: 75.0000 ug | ORAL_TABLET | Freq: Every day | ORAL | 1 refills | Status: DC

## 2020-05-14 NOTE — Patient Instructions (Signed)
DASH Eating Plan DASH stands for "Dietary Approaches to Stop Hypertension." The DASH eating plan is a healthy eating plan that has been shown to reduce high blood pressure (hypertension). It may also reduce your risk for type 2 diabetes, heart disease, and stroke. The DASH eating plan may also help with weight loss. What are tips for following this plan?  General guidelines  Avoid eating more than 2,300 mg (milligrams) of salt (sodium) a day. If you have hypertension, you may need to reduce your sodium intake to 1,500 mg a day.  Limit alcohol intake to no more than 1 drink a day for nonpregnant women and 2 drinks a day for men. One drink equals 12 oz of beer, 5 oz of wine, or 1 oz of hard liquor.  Work with your health care provider to maintain a healthy body weight or to lose weight. Ask what an ideal weight is for you.  Get at least 30 minutes of exercise that causes your heart to beat faster (aerobic exercise) most days of the week. Activities may include walking, swimming, or biking.  Work with your health care provider or diet and nutrition specialist (dietitian) to adjust your eating plan to your individual calorie needs. Reading food labels   Check food labels for the amount of sodium per serving. Choose foods with less than 5 percent of the Daily Value of sodium. Generally, foods with less than 300 mg of sodium per serving fit into this eating plan.  To find whole grains, look for the word "whole" as the first word in the ingredient list. Shopping  Buy products labeled as "low-sodium" or "no salt added."  Buy fresh foods. Avoid canned foods and premade or frozen meals. Cooking  Avoid adding salt when cooking. Use salt-free seasonings or herbs instead of table salt or sea salt. Check with your health care provider or pharmacist before using salt substitutes.  Do not fry foods. Cook foods using healthy methods such as baking, boiling, grilling, and broiling instead.  Cook with  heart-healthy oils, such as olive, canola, soybean, or sunflower oil. Meal planning  Eat a balanced diet that includes: ? 5 or more servings of fruits and vegetables each day. At each meal, try to fill half of your plate with fruits and vegetables. ? Up to 6-8 servings of whole grains each day. ? Less than 6 oz of lean meat, poultry, or fish each day. A 3-oz serving of meat is about the same size as a deck of cards. One egg equals 1 oz. ? 2 servings of low-fat dairy each day. ? A serving of nuts, seeds, or beans 5 times each week. ? Heart-healthy fats. Healthy fats called Omega-3 fatty acids are found in foods such as flaxseeds and coldwater fish, like sardines, salmon, and mackerel.  Limit how much you eat of the following: ? Canned or prepackaged foods. ? Food that is high in trans fat, such as fried foods. ? Food that is high in saturated fat, such as fatty meat. ? Sweets, desserts, sugary drinks, and other foods with added sugar. ? Full-fat dairy products.  Do not salt foods before eating.  Try to eat at least 2 vegetarian meals each week.  Eat more home-cooked food and less restaurant, buffet, and fast food.  When eating at a restaurant, ask that your food be prepared with less salt or no salt, if possible. What foods are recommended? The items listed may not be a complete list. Talk with your dietitian about   what dietary choices are best for you. Grains Whole-grain or whole-wheat bread. Whole-grain or whole-wheat pasta. Brown rice. Oatmeal. Quinoa. Bulgur. Whole-grain and low-sodium cereals. Pita bread. Low-fat, low-sodium crackers. Whole-wheat flour tortillas. Vegetables Fresh or frozen vegetables (raw, steamed, roasted, or grilled). Low-sodium or reduced-sodium tomato and vegetable juice. Low-sodium or reduced-sodium tomato sauce and tomato paste. Low-sodium or reduced-sodium canned vegetables. Fruits All fresh, dried, or frozen fruit. Canned fruit in natural juice (without  added sugar). Meat and other protein foods Skinless chicken or turkey. Ground chicken or turkey. Pork with fat trimmed off. Fish and seafood. Egg whites. Dried beans, peas, or lentils. Unsalted nuts, nut butters, and seeds. Unsalted canned beans. Lean cuts of beef with fat trimmed off. Low-sodium, lean deli meat. Dairy Low-fat (1%) or fat-free (skim) milk. Fat-free, low-fat, or reduced-fat cheeses. Nonfat, low-sodium ricotta or cottage cheese. Low-fat or nonfat yogurt. Low-fat, low-sodium cheese. Fats and oils Soft margarine without trans fats. Vegetable oil. Low-fat, reduced-fat, or light mayonnaise and salad dressings (reduced-sodium). Canola, safflower, olive, soybean, and sunflower oils. Avocado. Seasoning and other foods Herbs. Spices. Seasoning mixes without salt. Unsalted popcorn and pretzels. Fat-free sweets. What foods are not recommended? The items listed may not be a complete list. Talk with your dietitian about what dietary choices are best for you. Grains Baked goods made with fat, such as croissants, muffins, or some breads. Dry pasta or rice meal packs. Vegetables Creamed or fried vegetables. Vegetables in a cheese sauce. Regular canned vegetables (not low-sodium or reduced-sodium). Regular canned tomato sauce and paste (not low-sodium or reduced-sodium). Regular tomato and vegetable juice (not low-sodium or reduced-sodium). Pickles. Olives. Fruits Canned fruit in a light or heavy syrup. Fried fruit. Fruit in cream or butter sauce. Meat and other protein foods Fatty cuts of meat. Ribs. Fried meat. Bacon. Sausage. Bologna and other processed lunch meats. Salami. Fatback. Hotdogs. Bratwurst. Salted nuts and seeds. Canned beans with added salt. Canned or smoked fish. Whole eggs or egg yolks. Chicken or turkey with skin. Dairy Whole or 2% milk, cream, and half-and-half. Whole or full-fat cream cheese. Whole-fat or sweetened yogurt. Full-fat cheese. Nondairy creamers. Whipped toppings.  Processed cheese and cheese spreads. Fats and oils Butter. Stick margarine. Lard. Shortening. Ghee. Bacon fat. Tropical oils, such as coconut, palm kernel, or palm oil. Seasoning and other foods Salted popcorn and pretzels. Onion salt, garlic salt, seasoned salt, table salt, and sea salt. Worcestershire sauce. Tartar sauce. Barbecue sauce. Teriyaki sauce. Soy sauce, including reduced-sodium. Steak sauce. Canned and packaged gravies. Fish sauce. Oyster sauce. Cocktail sauce. Horseradish that you find on the shelf. Ketchup. Mustard. Meat flavorings and tenderizers. Bouillon cubes. Hot sauce and Tabasco sauce. Premade or packaged marinades. Premade or packaged taco seasonings. Relishes. Regular salad dressings. Where to find more information:  National Heart, Lung, and Blood Institute: www.nhlbi.nih.gov  American Heart Association: www.heart.org Summary  The DASH eating plan is a healthy eating plan that has been shown to reduce high blood pressure (hypertension). It may also reduce your risk for type 2 diabetes, heart disease, and stroke.  With the DASH eating plan, you should limit salt (sodium) intake to 2,300 mg a day. If you have hypertension, you may need to reduce your sodium intake to 1,500 mg a day.  When on the DASH eating plan, aim to eat more fresh fruits and vegetables, whole grains, lean proteins, low-fat dairy, and heart-healthy fats.  Work with your health care provider or diet and nutrition specialist (dietitian) to adjust your eating plan to your   individual calorie needs. This information is not intended to replace advice given to you by your health care provider. Make sure you discuss any questions you have with your health care provider. Document Revised: 06/05/2017 Document Reviewed: 06/16/2016 Elsevier Patient Education  2020 [employer]  

## 2020-05-14 NOTE — Progress Notes (Signed)
° °Subjective:  ° ° Patient ID: Haley Trevino, female    DOB: 01/02/1941, 79 y.o.   MRN: [id number] ° ° °Chief Complaint: medical management of chronic issues  ° °  ° °HPI: ° °1. Primary hypertension °No c/o chest pain, sob or headaches. Does check blood pressure at home. Has been running 134/62 this morning. Haley had renal artery test which was negative. °BP Readings from Last 3 Encounters:  °05/14/20 (!) 127/58  °03/13/20 (!) 118/44  °02/20/20 134/69  ° ° ° °2. Hyperlipidemia with target LDL less than 100 °Has been watching diet and is doing no exercise. We started her on zetia. °Lab Results  °Component Value Date  ° CHOL 189 01/23/2020  ° HDL 42 01/23/2020  ° LDLCALC 119 (H) 01/23/2020  ° TRIG 158 (H) 01/23/2020  ° CHOLHDL 4.5 (H) 01/23/2020  ° ° ° °3. Acquired hypothyroidism °No problems that Haley is aware of.  °Lab Results  °Component Value Date  ° TSH 1.290 01/23/2020  ° ° ° °4. Recurrent major depressive disorder, in full remission (HCC) °Has been on prozac for years. Haley is doing ok despite her health issues and her husbands health issues. Takes her klonopin BID. °Depression screen PHQ 2/9 05/14/2020 04/26/2020 02/20/2020  °Decreased Interest 0 0 0  °Down, Depressed, Hopeless 0 0 0  °PHQ - 2 Score 0 0 0  °Altered sleeping 0 - -  °Tired, decreased energy 0 - -  °Change in appetite 0 - -  °Feeling bad or failure about yourself  0 - -  °Trouble concentrating 0 - -  °Moving slowly or fidgety/restless 0 - -  °Suicidal thoughts 0 - -  °PHQ-9 Score 0 - -  °Difficult doing work/chores Not difficult at all - -  °Some recent data might be hidden  ° ° ° °5. Gastroesophageal reflux disease without esophagitis °Is on omeprazole daily for symptoms. Seems to be working ok.  ° °6. Age-related osteoporosis without current pathological fracture °Has severe osteoporosis and has had to recent fractures that Haley had cemented last month. °Haley occasionally takes 1/2 of pain medication. Pain comes and goes but is really bad when  Haley has it. ° °7. SCLERODERMA °Haley was seeing  dr. [name] but he never changed anything so Haley quit going.  ° ° ° °Outpatient Encounter Medications as of 05/14/2020  °Medication Sig  °• acetaminophen (TYLENOL) 500 MG tablet Take 1,000 mg by mouth every 6 (six) hours as needed for moderate pain or headache.  °• Bismuth Subsalicylate (PEPTO-BISMOL) 262 MG TABS Take 524 mg by mouth daily as needed (indigestion). (Patient not taking: Reported on 04/26/2020)  °• Cholecalciferol (VITAMIN D3) 2000 units TABS Take 2,000 Units by mouth daily. (Patient not taking: Reported on 04/26/2020)  °• clonazePAM (KLONOPIN) 0.5 MG tablet Take 1 tablet (0.5 mg total) by mouth 2 (two) times daily as needed for anxiety. (Patient taking differently: Take 0.5 mg by mouth 2 (two) times daily. )  °• cloNIDine (CATAPRES) 0.2 MG tablet Take 1 tablet (0.2 mg total) by mouth 2 (two) times daily. PLEASE NOTE CHANGE IN DOSE  °• ezetimibe (ZETIA) 10 MG tablet Take 1 tablet (10 mg total) by mouth daily.  °• ferrous sulfate 325 (65 FE) MG tablet Take 1 tablet (325 mg total) by mouth 2 (two) times daily with a meal.  °• FLUoxetine (PROZAC) 40 MG capsule Take 1 capsule (40 mg total) by mouth daily.  °• lisinopril (ZESTRIL) 40 MG tablet Take 1 tablet (40 mg total)   by mouth daily.  °• magnesium hydroxide (PHILLIPS MILK OF MAGNESIA) 311 MG CHEW chewable tablet Chew 311 mg by mouth every 4 (four) hours as needed (indigestion). (Patient not taking: Reported on 04/26/2020)  °• NIFEdipine (PROCARDIA XL/NIFEDICAL XL) 60 MG 24 hr tablet Take 1 tablet (60 mg total) by mouth daily.  °• omeprazole (PRILOSEC) 20 MG capsule TAKE 1 TABLET DAILY (Patient taking differently: Take 20 mg by mouth daily. )  °• Probiotic Product (PHILLIPS COLON HEALTH) CAPS Take 1 capsule by mouth daily. (Patient not taking: Reported on 04/26/2020)  °• PROLIA 60 MG/ML SOSY injection Inject 60 mg into the skin every 6 (six) months.  °• sildenafil (REVATIO) 20 MG tablet Take 1 tablet (20 mg  total) by mouth 3 (three) times daily. (Patient taking differently: Take 20 mg by mouth 2 (two) times daily. )  °• SYNTHROID 75 MCG tablet Take 1 tablet (75 mcg total) by mouth daily before breakfast.  °• trolamine salicylate (ASPERCREME) 10 % cream Apply 1 application topically as needed (knee pain).  ° ° ° °Past Surgical History:  °Procedure Laterality Date  °• BREAST ENHANCEMENT SURGERY  1975  °• BREAST IMPLANT REMOVAL Bilateral 04/23/2016  ° Procedure: REMOVALBILATERAL BREAST IMPLANTS;  Surgeon: Claire S Dillingham, DO;  Location: [facility];  Service: Plastics;  Laterality: Bilateral;  °• CHOLECYSTECTOMY  1973  °• HAND SURGERY  08/2012; 11/2012  °• IR RADIOLOGIST EVAL & MGMT  07/27/2017  °• IR SACROPLASTY BILATERAL  07/31/2017  °• IR VERTEBROPLASTY CERV/THOR BX INC UNI/BIL INC/INJECT/IMAGING  03/13/2020  °• IR VERTEBROPLASTY EA ADDL (T&LS) BX INC UNI/BIL INC INJECT/IMAGING  03/14/2020  °• MELANOMA EXCISION    ° at 31 yrs of age  °• ORIF HIP FRACTURE Right 02/22/2014  ° Procedure: OPEN REDUCTION INTERNAL FIXATION RIGHT HIP;  Surgeon: Wayne Keeling, MD;  Location: [facility];  Service: Orthopedics;  Laterality: Right;  °• TOTAL ABDOMINAL HYSTERECTOMY  1974  ° ° °Family History  °Problem Relation Age of Onset  °• Pancreatic cancer Father   °• Raynaud syndrome Father   °• Cancer Father   °     pancreatic  °• Rashes / Skin problems Daughter   °     possibly scleraderma  °• Hip fracture Mother   °• Mental illness Mother   °     attempted suicide at 56 yo  ° ° °New complaints: °Haley Trevino. Has no energy to do anything.  ° °Social history: °Lives with her husband who is also very sick. ° °Controlled substance contract: 01/24/20 ° ° ° °Review of Systems  °Constitutional: Negative for diaphoresis.  °Eyes: Negative for pain.  °Respiratory: Negative for shortness of breath.   °Cardiovascular: Negative for chest pain, palpitations and leg swelling.  °Gastrointestinal: Negative for abdominal pain.    °Endocrine: Negative for polydipsia.  °Skin: Negative for rash.  °Neurological: Negative for dizziness, weakness and headaches.  °Hematological: Does not bruise/bleed easily.  °All other systems reviewed and are negative. ° ° °   °Objective:  ° Physical Exam °Vitals and nursing note reviewed.  °Constitutional:   °   General: Haley is not in acute distress. °   Appearance: Normal appearance. Haley is well-developed.  °HENT:  °   Head: Normocephalic.  °   Nose: Nose normal.  °Eyes:  °   Pupils: Pupils are equal, round, and reactive to light.  °Neck:  °   Vascular: No carotid bruit or JVD.  °Cardiovascular:  °     Rate and Rhythm: Normal rate and regular rhythm.     Heart sounds: Normal heart sounds.  Pulmonary:     Effort: Pulmonary effort is normal. No respiratory distress.     Breath sounds: Normal breath sounds. No wheezing or rales.  Chest:     Chest wall: No tenderness.  Abdominal:     General: Bowel sounds are normal. There is no distension or abdominal bruit.     Palpations: Abdomen is soft. There is no hepatomegaly, splenomegaly, mass or pulsatile mass.     Tenderness: There is no abdominal tenderness.  Musculoskeletal:        General: Normal range of motion.     Cervical back: Normal range of motion and neck supple.  Lymphadenopathy:     Cervical: No cervical adenopathy.  Skin:    General: Skin is warm and dry.  Neurological:     Mental Status: Haley is alert and oriented to person, place, and time.     Deep Tendon Reflexes: Reflexes are normal and symmetric.  Psychiatric:        Behavior: Behavior normal.        Thought Content: Thought content normal.        Judgment: Judgment normal.     BP (!) 127/58    Pulse 75    Temp 98.1 F (36.7 C) (Temporal)    Resp 20    Ht 5' 2" (1.575 m)    Wt 109 lb (49.4 kg)    SpO2 98%    BMI 19.94 kg/m        Assessment & Plan:  Haley Trevino comes in today with chief complaint of Medical Management of Chronic Issues   Diagnosis and orders  addressed:  1. Primary hypertension Low sodium diet Continue to keep diary of blood pressure - which has actually gotten better, continu all meds - CBC with Differential/Platelet - CMP14+EGFR Also continue clonidine as rx. - sh emay try 1/2 tablet BId and see if blkood pressure wiill stay down. - lisinopril (ZESTRIL) 40 MG tablet; Take 1 tablet (40 mg total) by mouth daily.  Dispense: 90 tablet; Refill: 1  2. Hyperlipidemia with target LDL less than 100 Low fat diet - Lipid panel - ezetimibe (ZETIA) 10 MG tablet; Take 1 tablet (10 mg total) by mouth daily.  Dispense: 90 tablet; Refill: 3  3. Acquired hypothyroidism - Thyroid Panel With TSH - SYNTHROID 75 MCG tablet; Take 1 tablet (75 mcg total) by mouth daily before breakfast.  Dispense: 90 tablet; Refill: 1 - ferrous sulfate 325 (65 FE) MG tablet; Take 1 tablet (325 mg total) by mouth 2 (two) times daily with a meal.  Dispense: 90 tablet; Refill: 1  4. Recurrent major depressive disorder, in full remission ([place]) Stress management - FLUoxetine (PROZAC) 40 MG capsule; Take 1 capsule (40 mg total) by mouth daily.  Dispense: 90 capsule; Refill: 3  5. Gastroesophageal reflux disease without esophagitis Avoid spicy foods Do not eat 2 hours prior to bedtime - omeprazole (PRILOSEC) 20 MG capsule; TAKE 1 TABLET DAILY  Dispense: 90 capsule; Refill: 1  6. Age-related osteoporosis without current pathological fracture Weight bearing exercise  7. SCLERODERMA Stable- does not want to go to rheumatology  8. Anxiety Stress management - clonazePAM (KLONOPIN) 0.5 MG tablet; Take 1 tablet (0.5 mg total) by mouth 2 (two) times daily as needed for anxiety.  Dispense: 60 tablet; Refill: 2  9. [name] with gangrene ([place]) Keep hands warm - NIFEdipine (PROCARDIA XL/NIFEDICAL XL)  60 MG 24 hr tablet; Take 1 tablet (60 mg total) by mouth daily.  Dispense: 90 tablet; Refill: 1 - sildenafil (REVATIO) 20 MG tablet; Take 1 tablet (20 mg total)  by mouth 2 (two) times daily.  Dispense: 180 tablet; Refill: 1  11. Chronic bilateral thoracic back pain Moist heat rest - predniSONE (STERAPRED UNI-PAK 21 TAB) 10 MG (21) TBPK tablet; As directed x 6 days  Dispense: 21 tablet; Refill: 0   Labs pending Health Maintenance reviewed Diet and exercise encouraged  Follow up plan: 3 months   Mary-Margaret Hassell Done, FNP

## 2020-05-15 DIAGNOSIS — E785 Hyperlipidemia, unspecified: Secondary | ICD-10-CM | POA: Diagnosis not present

## 2020-05-15 DIAGNOSIS — E039 Hypothyroidism, unspecified: Secondary | ICD-10-CM | POA: Diagnosis not present

## 2020-05-15 DIAGNOSIS — I7301 Raynaud's syndrome with gangrene: Secondary | ICD-10-CM | POA: Diagnosis not present

## 2020-05-15 DIAGNOSIS — Z23 Encounter for immunization: Secondary | ICD-10-CM | POA: Diagnosis not present

## 2020-05-15 DIAGNOSIS — M810 Age-related osteoporosis without current pathological fracture: Secondary | ICD-10-CM | POA: Diagnosis not present

## 2020-05-15 DIAGNOSIS — D509 Iron deficiency anemia, unspecified: Secondary | ICD-10-CM | POA: Diagnosis not present

## 2020-05-15 DIAGNOSIS — M546 Pain in thoracic spine: Secondary | ICD-10-CM | POA: Diagnosis not present

## 2020-05-15 DIAGNOSIS — K219 Gastro-esophageal reflux disease without esophagitis: Secondary | ICD-10-CM | POA: Diagnosis not present

## 2020-05-15 DIAGNOSIS — M349 Systemic sclerosis, unspecified: Secondary | ICD-10-CM | POA: Diagnosis not present

## 2020-05-15 DIAGNOSIS — I10 Essential (primary) hypertension: Secondary | ICD-10-CM | POA: Diagnosis not present

## 2020-05-15 DIAGNOSIS — F3342 Major depressive disorder, recurrent, in full remission: Secondary | ICD-10-CM | POA: Diagnosis not present

## 2020-05-15 DIAGNOSIS — F419 Anxiety disorder, unspecified: Secondary | ICD-10-CM | POA: Diagnosis not present

## 2020-05-15 LAB — CMP14+EGFR
ALT: 5 IU/L (ref 0–32)
AST: 12 IU/L (ref 0–40)
Albumin/Globulin Ratio: 1.8 (ref 1.2–2.2)
Albumin: 4.3 g/dL (ref 3.7–4.7)
Alkaline Phosphatase: 45 IU/L (ref 44–121)
BUN/Creatinine Ratio: 14 (ref 12–28)
BUN: 11 mg/dL (ref 8–27)
Bilirubin Total: <0.2 mg/dL (ref 0.0–1.2)
CO2: 24 mmol/L (ref 20–29)
Calcium: 8.3 mg/dL — ABNORMAL LOW (ref 8.7–10.3)
Chloride: 105 mmol/L (ref 96–106)
Creatinine, Ser: 0.81 mg/dL (ref 0.57–1.00)
GFR calc Af Amer: 80 mL/min/{1.73_m2} (ref >59)
GFR calc non Af Amer: 70 mL/min/{1.73_m2} (ref >59)
Globulin, Total: 2.4 g/dL (ref 1.5–4.5)
Glucose: 124 mg/dL — ABNORMAL HIGH (ref 65–99)
Potassium: 4.3 mmol/L (ref 3.5–5.2)
Sodium: 139 mmol/L (ref 134–144)
Total Protein: 6.7 g/dL (ref 6.0–8.5)

## 2020-05-15 LAB — LIPID PANEL
Chol/HDL Ratio: 3.7 ratio (ref 0.0–4.4)
Cholesterol, Total: 166 mg/dL (ref 100–199)
HDL: 45 mg/dL (ref >39)
LDL Chol Calc (NIH): 92 mg/dL (ref 0–99)
Triglycerides: 168 mg/dL — ABNORMAL HIGH (ref 0–149)
VLDL Cholesterol Cal: 29 mg/dL (ref 5–40)

## 2020-05-15 LAB — CBC WITH DIFFERENTIAL/PLATELET
Basophils Absolute: 0.1 10*3/uL (ref 0.0–0.2)
Basos: 1 %
EOS (ABSOLUTE): 0.2 10*3/uL (ref 0.0–0.4)
Eos: 2 %
Hematocrit: 30.3 % — ABNORMAL LOW (ref 34.0–46.6)
Hemoglobin: 9.3 g/dL — ABNORMAL LOW (ref 11.1–15.9)
Immature Grans (Abs): 0 10*3/uL (ref 0.0–0.1)
Immature Granulocytes: 0 %
Lymphocytes Absolute: 2.3 10*3/uL (ref 0.7–3.1)
Lymphs: 26 %
MCH: 26.8 pg (ref 26.6–33.0)
MCHC: 30.7 g/dL — ABNORMAL LOW (ref 31.5–35.7)
MCV: 87 fL (ref 79–97)
Monocytes Absolute: 0.7 10*3/uL (ref 0.1–0.9)
Monocytes: 8 %
Neutrophils Absolute: 5.6 10*3/uL (ref 1.4–7.0)
Neutrophils: 63 %
Platelets: 333 10*3/uL (ref 150–450)
RBC: 3.47 x10E6/uL — ABNORMAL LOW (ref 3.77–5.28)
RDW: 13.7 % (ref 11.7–15.4)
WBC: 8.8 10*3/uL (ref 3.4–10.8)

## 2020-05-15 LAB — THYROID PANEL WITH TSH
Free Thyroxine Index: 2.5 (ref 1.2–4.9)
T3 Uptake Ratio: 28 % (ref 24–39)
T4, Total: 8.8 ug/dL (ref 4.5–12.0)
TSH: 1.68 u[IU]/mL (ref 0.450–4.500)

## 2020-05-15 LAB — FERRITIN: Ferritin: 5 ng/mL — ABNORMAL LOW (ref 15–150)

## 2020-05-16 ENCOUNTER — Ambulatory Visit: Payer: Medicare Other | Admitting: Licensed Clinical Social Worker

## 2020-05-16 DIAGNOSIS — E785 Hyperlipidemia, unspecified: Secondary | ICD-10-CM

## 2020-05-16 DIAGNOSIS — E039 Hypothyroidism, unspecified: Secondary | ICD-10-CM

## 2020-05-16 DIAGNOSIS — I7301 Raynaud's syndrome with gangrene: Secondary | ICD-10-CM

## 2020-05-16 DIAGNOSIS — M810 Age-related osteoporosis without current pathological fracture: Secondary | ICD-10-CM

## 2020-05-16 DIAGNOSIS — F3342 Major depressive disorder, recurrent, in full remission: Secondary | ICD-10-CM

## 2020-05-16 DIAGNOSIS — K219 Gastro-esophageal reflux disease without esophagitis: Secondary | ICD-10-CM

## 2020-05-16 DIAGNOSIS — I10 Essential (primary) hypertension: Secondary | ICD-10-CM

## 2020-05-16 NOTE — Patient Instructions (Addendum)
Licensed Clinical Social Worker Visit Information  Goals we discussed today:   .  Client will talk with LCSW in next 30 days to discuss stress issues of client and managing stress issues faced (pt-stated)        CARE PLAN ENTRY   Current Barriers:   Pain barriers in client with chronic diagnoses of OA, Hypothyroidism, Depression, GERD, HTN, HLD, Osteoporosis, [name]  Anemia   Fatigue  Potential for falls  Clinical Social Work Clinical Goal(s):   LCSW will call client in next 30 days to talk with client about managing stress issues of client  Interventions:  Talked with client about CCM program support  Encouraged client to talk with RNCM to discuss nursing needs of client  Talked with client about stress issues of client  Talked with client about pain issues of client  Talked with client about health issues of her spouse  Talked with client about sleeping challenges of client  Talked with client about relaxation techniques (enjoys crafts, reading,cares for pets)  Talked with client about her decreased appetite  Talked with client about ambulation challenges of client ( client said she has Vertigo  Patient Self Care Activities:  Does daily ADLS independently Takes medications as prescribed Attends medical appointments  Patient Self Care Deficits  Anemia  Weakness, fatigue  Fall potential    Initial goal documentation     Follow Up Plan:LCSW to call client in next 4 weeks to talk with her about stress issues faced by client and to talk about client strategies for managing stress issues faced  Materials Provided: No  The patient verbalized understanding of instructions provided today and declined a print copy of patient instruction materials.   Norva Riffle.Agustine Rossitto MSW, LCSW Licensed Clinical Social Worker [facility] [phone]

## 2020-05-16 NOTE — Chronic Care Management (AMB) (Signed)
Chronic Care Management    Clinical Social Work Follow Up Note  05/16/2020 Name: CHAKIRA JACHIM MRN: [id number] DOB: [birth year]  JERSEY ESPINOZA is a 79 y.o. year old female who is a primary care patient of [name], [place]. The CCM team was consulted for assistance with [name] .   Review of patient status, including review of consultants reports, other relevant assessments, and collaboration with appropriate care team members and the patient's provider was performed as part of comprehensive patient evaluation and provision of chronic care management services.    SDOH (Social Determinants of Health) assessments performed: No; risk for depression; risk for tobacco use; risk for stress; risk for physical inactivity    Office Visit from 05/14/2020 in [place]  PHQ-9 Total Score 0     GAD 7 : Generalized Anxiety Score 02/20/2020 10/24/2019 04/14/2019  Nervous, Anxious, on Edge 3 3 2   Control/stop worrying 0 1 1  Worry too much - different things 0 1 1  Trouble relaxing 3 0 1  Restless 0 0 0  Easily annoyed or irritable 0 0 0  Afraid - awful might happen 1 0 1  Total GAD 7 Score 7 5 6   Anxiety Difficulty Not difficult at all Somewhat difficult -    Outpatient Encounter Medications as of 05/16/2020  Medication Sig  . acetaminophen (TYLENOL) 500 MG tablet Take 1,000 mg by mouth every 6 (six) hours as needed for moderate pain or headache.  . Bismuth Subsalicylate (PEPTO-BISMOL) 262 MG TABS Take 524 mg by mouth daily as needed (indigestion).   . Cholecalciferol (VITAMIN D3) 2000 units TABS Take 2,000 Units by mouth daily.   . clonazePAM (KLONOPIN) 0.5 MG tablet Take 1 tablet (0.5 mg total) by mouth 2 (two) times daily as needed for anxiety.  . cloNIDine (CATAPRES) 0.2 MG tablet Take 1 tablet (0.2 mg total) by mouth 2 (two) times daily. PLEASE NOTE CHANGE IN DOSE  . ezetimibe (ZETIA) 10 MG tablet Take 1 tablet (10 mg total) by mouth daily.   . ferrous sulfate 325 (65 FE) MG tablet Take 1 tablet (325 mg total) by mouth 2 (two) times daily with a meal.  . FLUoxetine (PROZAC) 40 MG capsule Take 1 capsule (40 mg total) by mouth daily.  Marland Kitchen lisinopril (ZESTRIL) 40 MG tablet Take 1 tablet (40 mg total) by mouth daily.  . magnesium hydroxide (PHILLIPS MILK OF MAGNESIA) 311 MG CHEW chewable tablet Chew 311 mg by mouth every 4 (four) hours as needed (indigestion).   Marland Kitchen NIFEdipine (PROCARDIA XL/NIFEDICAL XL) 60 MG 24 hr tablet Take 1 tablet (60 mg total) by mouth daily.  Marland Kitchen omeprazole (PRILOSEC) 20 MG capsule TAKE 1 TABLET DAILY  . predniSONE (STERAPRED UNI-PAK 21 TAB) 10 MG (21) TBPK tablet As directed x 6 days  . Probiotic Product ([place]) CAPS Take 1 capsule by mouth daily.   Marland Kitchen PROLIA 60 MG/ML SOSY injection Inject 60 mg into the skin every 6 (six) months.  . sildenafil (REVATIO) 20 MG tablet Take 1 tablet (20 mg total) by mouth 2 (two) times daily.  Marland Kitchen SYNTHROID 75 MCG tablet Take 1 tablet (75 mcg total) by mouth daily before breakfast.  . trolamine salicylate (ASPERCREME) 10 % cream Apply 1 application topically as needed (knee pain).   No facility-administered encounter medications on file as of 05/16/2020.    Goals    .  Client will talk with LCSW in next 30 days to discuss stress issues of client  and managing stress issues faced (pt-stated)      CARE PLAN ENTRY   Current Barriers:  . Pain barriers in client with chronic diagnoses of OA, Hypothyroidism, Depression, GERD, HTN, HLD, Osteoporosis, [name] . Anemia  . Fatigue . Potential for falls  Clinical Social Work Clinical Goal(s):  Marland Kitchen LCSW will call client in next 30 days to talk with client about managing stress issues of client  Interventions: . Talked with client about CCM program support . Encouraged client to talk with RNCM to discuss nursing needs of client . Talked with client about stress issues of client . Talked with client about pain issues  of client . Talked with client about health issues of her spouse . Talked with client about sleeping challenges of client . Talked with client about relaxation techniques (enjoys crafts, reading,cares for pets) . Talked with client about her decreased appetite . Talked with client about ambulation challenges of client ( client said she has Vertigo  Patient Self Care Activities:  Does daily ADLS independently Takes medications as prescribed Attends medical appointments  Patient Self Care Deficits . Anemia . Weakness, fatigue . Fall potential .   Initial goal documentation     Follow Up Plan: LCSW to call client in next 4 weeks to talk with her about stress issues faced by client and to talk about client strategies for managing stress issues faced  Norva Riffle.Courtney Bellizzi MSW, LCSW Licensed Clinical Social Worker [facility] [phone]

## 2020-06-15 ENCOUNTER — Other Ambulatory Visit: Payer: Self-pay | Admitting: Nurse Practitioner

## 2020-06-15 ENCOUNTER — Telehealth: Payer: Self-pay | Admitting: Nurse Practitioner

## 2020-06-15 DIAGNOSIS — I10 Essential (primary) hypertension: Secondary | ICD-10-CM

## 2020-06-15 NOTE — Telephone Encounter (Signed)
Please schedule a visit to be evaluated, this is been going on for more than a month, likely has nothing to do with the vaccine, needs to be evaluated.

## 2020-06-15 NOTE — Telephone Encounter (Signed)
Pt took the moderna booster shot 11/19, woke up that night and felt sick. Has not started feeling any better. Has felt shaky, had constant headaches and feels short of breath with any activity. Tested for COVID and it was negative

## 2020-06-18 NOTE — Telephone Encounter (Signed)
Appointment scheduled.

## 2020-06-19 ENCOUNTER — Other Ambulatory Visit: Payer: Self-pay | Admitting: Nurse Practitioner

## 2020-06-19 DIAGNOSIS — M79603 Pain in arm, unspecified: Secondary | ICD-10-CM | POA: Diagnosis not present

## 2020-06-19 DIAGNOSIS — I959 Hypotension, unspecified: Secondary | ICD-10-CM | POA: Diagnosis not present

## 2020-06-19 DIAGNOSIS — R0602 Shortness of breath: Secondary | ICD-10-CM | POA: Diagnosis not present

## 2020-06-19 DIAGNOSIS — I10 Essential (primary) hypertension: Secondary | ICD-10-CM

## 2020-06-19 DIAGNOSIS — R531 Weakness: Secondary | ICD-10-CM | POA: Diagnosis not present

## 2020-06-19 DIAGNOSIS — R52 Pain, unspecified: Secondary | ICD-10-CM | POA: Diagnosis not present

## 2020-06-20 ENCOUNTER — Ambulatory Visit (INDEPENDENT_AMBULATORY_CARE_PROVIDER_SITE_OTHER): Payer: Medicare Other | Admitting: Family Medicine

## 2020-06-20 ENCOUNTER — Encounter: Payer: Self-pay | Admitting: Family Medicine

## 2020-06-20 ENCOUNTER — Other Ambulatory Visit: Payer: Self-pay

## 2020-06-20 ENCOUNTER — Ambulatory Visit: Payer: Medicare Other | Admitting: Licensed Clinical Social Worker

## 2020-06-20 VITALS — BP 107/61 | HR 76 | Temp 98.0°F | Ht 62.0 in | Wt 106.5 lb

## 2020-06-20 DIAGNOSIS — M810 Age-related osteoporosis without current pathological fracture: Secondary | ICD-10-CM

## 2020-06-20 DIAGNOSIS — I10 Essential (primary) hypertension: Secondary | ICD-10-CM

## 2020-06-20 DIAGNOSIS — K219 Gastro-esophageal reflux disease without esophagitis: Secondary | ICD-10-CM

## 2020-06-20 DIAGNOSIS — F3342 Major depressive disorder, recurrent, in full remission: Secondary | ICD-10-CM

## 2020-06-20 DIAGNOSIS — E860 Dehydration: Secondary | ICD-10-CM

## 2020-06-20 DIAGNOSIS — E785 Hyperlipidemia, unspecified: Secondary | ICD-10-CM

## 2020-06-20 DIAGNOSIS — R5383 Other fatigue: Secondary | ICD-10-CM

## 2020-06-20 DIAGNOSIS — M159 Polyosteoarthritis, unspecified: Secondary | ICD-10-CM

## 2020-06-20 DIAGNOSIS — I7300 Raynaud's syndrome without gangrene: Secondary | ICD-10-CM

## 2020-06-20 DIAGNOSIS — E039 Hypothyroidism, unspecified: Secondary | ICD-10-CM

## 2020-06-20 NOTE — Chronic Care Management (AMB) (Signed)
  Chronic Care Management    Clinical Social Work Follow Up Note  06/20/2020 Name: Haley Trevino MRN: [id number] DOB: April 19, 1941  Haley Trevino is a 79 y.o. year old female who is a primary care patient of [name], [place]. The CCM team was consulted for assistance with [name] .   Review of patient status, including review of consultants reports, other relevant assessments, and collaboration with appropriate care team members and the patient's provider was performed as part of comprehensive patient evaluation and provision of chronic care management services.    SDOH (Social Determinants of Health) assessments performed: No;risk for depression; risk for tobacco use; risk for stress; risk for physical inactivity  [place] Visit from 05/14/2020 in [place]  PHQ-9 Total Score 0      GAD 7 : Generalized Anxiety Score 02/20/2020 10/24/2019 04/14/2019  Nervous, Anxious, on Edge 3 3 2   Control/stop worrying 0 1 1  Worry too much - different things 0 1 1  Trouble relaxing 3 0 1  Restless 0 0 0  Easily annoyed or irritable 0 0 0  Afraid - awful might happen 1 0 1  Total GAD 7 Score 7 5 6   Anxiety Difficulty Not difficult at all Somewhat difficult -    Outpatient Encounter Medications as of 06/20/2020  Medication Sig  . acetaminophen (TYLENOL) 500 MG tablet Take 1,000 mg by mouth every 6 (six) hours as needed for moderate pain or headache.  . Cholecalciferol (VITAMIN D3) 2000 units TABS Take 2,000 Units by mouth daily.   . clonazePAM (KLONOPIN) 0.5 MG tablet Take 1 tablet (0.5 mg total) by mouth 2 (two) times daily as needed for anxiety.  . cloNIDine (CATAPRES) 0.2 MG tablet TAKE 1 TABLET 2 TIMES A DAY  . ezetimibe (ZETIA) 10 MG tablet Take 1 tablet (10 mg total) by mouth daily.  . ferrous sulfate 325 (65 FE) MG tablet Take 1 tablet (325 mg total) by mouth 2 (two) times daily with a meal. (Patient not taking: Reported on  06/20/2020)  . FLUoxetine (PROZAC) 40 MG capsule Take 1 capsule (40 mg total) by mouth daily.  Marland Kitchen lisinopril (ZESTRIL) 40 MG tablet Take 1 tablet (40 mg total) by mouth daily.  Marland Kitchen NIFEdipine (PROCARDIA XL/NIFEDICAL XL) 60 MG 24 hr tablet Take 1 tablet (60 mg total) by mouth daily.  Marland Kitchen omeprazole (PRILOSEC) 20 MG capsule TAKE 1 TABLET DAILY  . Probiotic Product ([place]) CAPS Take 1 capsule by mouth daily.   Marland Kitchen PROLIA 60 MG/ML SOSY injection Inject 60 mg into the skin every 6 (six) months.  . sildenafil (REVATIO) 20 MG tablet Take 1 tablet (20 mg total) by mouth 2 (two) times daily.  Marland Kitchen SYNTHROID 75 MCG tablet Take 1 tablet (75 mcg total) by mouth daily before breakfast.   No facility-administered encounter medications on file as of 06/20/2020.    LCSW called client home phone number several times today but LCSW was not able to speak via phone with client today; LCSW did leave phone message for client today requesting that she return call to LCSW at [phone]  Follow Up Plan:LCSW to call client in next 4 weeks to talk with her about stress issues faced by client and to talk about client strategies for managing stress issues faced  Norva Riffle.Deziah Renwick MSW, LCSW Licensed Clinical Social Worker [facility] [phone]

## 2020-06-20 NOTE — Patient Instructions (Signed)
Dehydration, Adult Dehydration is a condition in which there is not enough water or other fluids in the body. This happens when a person loses more fluids than he or she takes in. Important organs, such as the kidneys, brain, and heart, cannot function without a proper amount of fluids. Any loss of fluids from the body can lead to dehydration. Dehydration can be mild, moderate, or severe. It should be treated right away to prevent it from becoming severe. What are the causes? Dehydration may be caused by:  Conditions that cause loss of water or other fluids, such as diarrhea, vomiting, or sweating or urinating a lot.  Not drinking enough fluids, especially when you are ill or doing activities that require a lot of energy.  Other illnesses and conditions, such as fever or infection.  Certain medicines, such as medicines that remove excess fluid from the body (diuretics).  Lack of safe drinking water.  Not being able to get enough water and food. What increases the risk? The following factors may make you more likely to develop this condition:  Having a long-term (chronic) illness that has not been treated properly, such as diabetes, heart disease, or kidney disease.  Being 20 years of age or older.  Having a disability.  Living in a place that is high in altitude, where thinner, drier air causes more fluid loss.  Doing exercises that put stress on your body for a long time (endurance sports). What are the signs or symptoms? Symptoms of dehydration depend on how severe it is. Mild or moderate dehydration  Thirst.  Dry lips or dry mouth.  Dizziness or light-headedness, especially when standing up from a seated position.  Muscle cramps.  Dark urine. Urine may be the color of tea.  Less urine or tears produced than usual.  Headache. Severe dehydration  Changes in skin. Your skin may be cold and clammy, blotchy, or pale. Your skin also may not return to normal after being  lightly pinched and released.  Little or no tears, urine, or sweat.  Changes in vital signs, such as rapid breathing and low blood pressure. Your pulse may be weak or may be faster than 100 beats a minute when you are sitting still.  Other changes, such as: ? Feeling very thirsty. ? Sunken eyes. ? Cold hands and feet. ? Confusion. ? Being very tired (lethargic) or having trouble waking from sleep. ? Short-term weight loss. ? Loss of consciousness. How is this diagnosed? This condition is diagnosed based on your symptoms and a physical exam. You may have blood and urine tests to help confirm the diagnosis. How is this treated? Treatment for this condition depends on how severe it is. Treatment should be started right away. Do not wait until dehydration becomes severe. Severe dehydration is an emergency and needs to be treated in a hospital.  Mild or moderate dehydration can be treated at home. You may be asked to: ? Drink more fluids. ? Drink an oral rehydration solution (ORS). This drink helps restore proper amounts of fluids and salts and minerals in the blood (electrolytes).  Severe dehydration can be treated: ? With IV fluids. ? By correcting abnormal levels of electrolytes. This is often done by giving electrolytes through a tube that is passed through your nose and into your stomach (nasogastric tube, or NG tube). ? By treating the underlying cause of dehydration. Follow these instructions at home: Oral rehydration solution If told by your health care provider, drink an ORS:  Make  an ORS by following instructions on the package.  Start by drinking small amounts, about  cup (120 mL) every 5-10 minutes.  Slowly increase how much you drink until you have taken the amount recommended by your health care provider. Eating and drinking         Drink enough clear fluid to keep your urine pale yellow. If you were told to drink an ORS, finish the ORS first and then start slowly  drinking other clear fluids. Drink fluids such as: ? Water. Do not drink only water. Doing that can lead to hyponatremia, which is having too little salt (sodium) in the body. ? Water from ice chips you suck on. ? Fruit juice that you have added water to (diluted fruit juice). ? Low-calorie sports drinks.  Eat foods that contain a healthy balance of electrolytes, such as bananas, oranges, potatoes, tomatoes, and spinach.  Do not drink alcohol.  Avoid the following: ? Drinks that contain a lot of sugar. These include high-calorie sports drinks, fruit juice that is not diluted, and soda. ? Caffeine. ? Foods that are greasy or contain a lot of fat or sugar. General instructions  Take over-the-counter and prescription medicines only as told by your health care provider.  Do not take sodium tablets. Doing that can lead to having too much sodium in the body (hypernatremia).  Return to your normal activities as told by your health care provider. Ask your health care provider what activities are safe for you.  Keep all follow-up visits as told by your health care provider. This is important. Contact a health care provider if:  You have muscle cramps, pain, or discomfort, such as: ? Pain in your abdomen and the pain gets worse or stays in one area (localizes). ? Stiff neck.  You have a rash.  You are more irritable than usual.  You are sleepier or have a harder time waking than usual.  You feel weak or dizzy.  You feel very thirsty. Get help right away if you have:  Any symptoms of severe dehydration.  Symptoms of vomiting, such as: ? You cannot eat or drink without vomiting. ? Vomiting gets worse or does not go away. ? Vomit includes blood or green matter (bile).  Symptoms that get worse with treatment.  A fever.  A severe headache.  Problems with urination or bowel movements, such as: ? Diarrhea that gets worse or does not go away. ? Blood in your stool (feces). This  may cause stool to look black and tarry. ? Not urinating, or urinating only a small amount of very dark urine, within 6-8 hours.  Trouble breathing. These symptoms may represent a serious problem that is an emergency. Do not wait to see if the symptoms will go away. Get medical help right away. Call your local emergency services (911 in the [place] Do not drive yourself to the hospital. Summary  Dehydration is a condition in which there is not enough water or other fluids in the body. This happens when a person loses more fluids than he or she takes in.  Treatment for this condition depends on how severe it is. Treatment should be started right away. Do not wait until dehydration becomes severe.  Drink enough clear fluid to keep your urine pale yellow. If you were told to drink an oral rehydration solution (ORS), finish the ORS first and then start slowly drinking other clear fluids.  Take over-the-counter and prescription medicines only as told by your health care  provider.  Get help right away if you have any symptoms of severe dehydration. This information is not intended to replace advice given to you by your health care provider. Make sure you discuss any questions you have with your health care provider. Document Revised: 02/03/2019 Document Reviewed: 02/03/2019 Elsevier Patient Education  [place]. Iron-Rich Diet  Iron is a mineral that helps your body to produce hemoglobin. Hemoglobin is a protein in red blood cells that carries oxygen to your body's tissues. Eating too little iron may cause you to feel weak and tired, and it can increase your risk of infection. Iron is naturally found in many foods, and many foods have iron added to them (iron-fortified foods). You may need to follow an iron-rich diet if you do not have enough iron in your body due to certain medical conditions. The amount of iron that you need each day depends on your age, your sex, and any medical conditions  you have. Follow instructions from your health care provider or a diet and nutrition specialist (dietitian) about how much iron you should eat each day. What are tips for following this plan? Reading food labels  Check food labels to see how many milligrams (mg) of iron are in each serving. Cooking  Cook foods in pots and pans that are made from iron.  Take these steps to make it easier for your body to absorb iron from certain foods: ? Soak beans overnight before cooking. ? Soak whole grains overnight and drain them before using. ? Ferment flours before baking, such as by using yeast in bread dough. Meal planning  When you eat foods that contain iron, you should eat them with foods that are high in vitamin C. These include oranges, peppers, tomatoes, potatoes, and mango. Vitamin C helps your body to absorb iron. General information  Take iron supplements only as told by your health care provider. An overdose of iron can be life-threatening. If you were prescribed iron supplements, take them with orange juice or a vitamin C supplement.  When you eat iron-fortified foods or take an iron supplement, you should also eat foods that naturally contain iron, such as meat, poultry, and fish. Eating naturally iron-rich foods helps your body to absorb the iron that is added to other foods or contained in a supplement.  Certain foods and drinks prevent your body from absorbing iron properly. Avoid eating these foods in the same meal as iron-rich foods or with iron supplements. These foods include: ? Coffee, black tea, and red wine. ? Milk, dairy products, and foods that are high in calcium. ? Beans and soybeans. ? Whole grains. What foods should I eat? Fruits Prunes. Raisins. Eat fruits high in vitamin C, such as oranges, grapefruits, and strawberries, alongside iron-rich foods. Vegetables Spinach (cooked). Green peas. Broccoli. Fermented vegetables. Eat vegetables high in vitamin C, such as  leafy greens, potatoes, bell peppers, and tomatoes, alongside iron-rich foods. Grains Iron-fortified breakfast cereal. Iron-fortified whole-wheat bread. Enriched rice. Sprouted grains. Meats and other proteins Beef liver. Oysters. Beef. Shrimp. [place]. Chicken. [place]. Sardines. Chickpeas. Nuts. Tofu. Pumpkin seeds. Beverages Tomato juice. Fresh orange juice. Prune juice. Hibiscus tea. Fortified instant breakfast shakes. Sweets and desserts Blackstrap molasses. Seasonings and condiments Tahini. Fermented soy sauce. Other foods Wheat germ. The items listed above may not be a complete list of recommended foods and beverages. Contact a dietitian for more information. What foods should I avoid? Grains Whole grains. Bran cereal. Bran flour. Oats. Meats and other proteins  Soybeans. Products made from soy protein. Black beans. Lentils. Mung beans. Split peas. Dairy Milk. Cream. Cheese. Yogurt. Cottage cheese. Beverages Coffee. Black tea. Red wine. Sweets and desserts Cocoa. Chocolate. Ice cream. Other foods Basil. Oregano. Large amounts of parsley. The items listed above may not be a complete list of foods and beverages to avoid. Contact a dietitian for more information. Summary  Iron is a mineral that helps your body to produce hemoglobin. Hemoglobin is a protein in red blood cells that carries oxygen to your body's tissues.  Iron is naturally found in many foods, and many foods have iron added to them (iron-fortified foods).  When you eat foods that contain iron, you should eat them with foods that are high in vitamin C. Vitamin C helps your body to absorb iron.  Certain foods and drinks prevent your body from absorbing iron properly, such as whole grains and dairy products. You should avoid eating these foods in the same meal as iron-rich foods or with iron supplements. This information is not intended to replace advice given to you by your health care provider. Make sure you discuss  any questions you have with your health care provider. Document Revised: 06/05/2017 Document Reviewed: 05/19/2017 Elsevier Patient Education  2020 [employer].

## 2020-06-20 NOTE — Patient Instructions (Addendum)
Licensed Clinical Social Worker Visit Information  Materials Provided: No  06/20/2020  Name: Haley Trevino       MRN: [id number]       DOB: 23-Mar-1941  Haley Trevino is a 79 y.o. year old female who is a primary care patient of [name], [employer]. The CCM team was consulted for assistance with [name] .   Review of patient status, including review of consultants reports, other relevant assessments, and collaboration with appropriate care team members and the patient's provider was performed as part of comprehensive patient evaluation and provision of chronic care management services.    SDOH (Social Determinants of Health) assessments performed: No;risk for depression; risk for tobacco use; risk for stress; risk for physical inactivity  LCSW called client home phone number several times today but LCSW was not able to speak via phone with client today; LCSW did leave phone message for client today requesting that she return call to LCSW at [phone]  Follow Up Plan:LCSW to call client in next 4 weeks to talk with her about stress issues faced by client and to talk about client strategies for managing stress issues faced  LCSW was not able to speak via phone with client today; thus, the client was not able to verbalize understanding of instructions provided today and was not able to accept or decline a print copy of patient instruction materials.   Norva Riffle.Jayleana Colberg MSW, LCSW Licensed Clinical Social Worker [facility] [phone]

## 2020-06-20 NOTE — Progress Notes (Signed)
Subjective: CC: headache  PCP: [name], FNP  Haley Trevino is a 79 y.o. female presenting to clinic today for:  1. Headache Haley Trevino had her Covid booster shot on 05/25/20. She was very sick after with weakness, headache, fever, and chills. She was not eating or drinking much since being sick. She reports that she gets easily dehydrated. Her husband called EMS yesterday because she couldn't get out of the bed from feeling so weak. She was told she was most likely dehydrated but that they couldn't take her to the hospital because the hospital was full. She was told to drink Pedialyte and ensure. She reports that she feels much better now that she has been drinking Pedialyte and an ensure. She does still feel shaky and a little weak today but she feels a significant improvement from yesterday. She reports that her headache is relieved. She denies fever, chest pain, new shortness of breath, dizziness, palpitations, or lightheadedness. Denies vomiting. She has had a single episode of diarrhea after drinking Pedialyte. She has a history of anemia and she has not been taking her iron supplement for the last few weeks. She denies blood in her stool.   Relevant past medical, surgical, family, and social history reviewed and updated as indicated.  Allergies and medications reviewed and updated.  Allergies  Allergen Reactions  . Dilaudid [Hydromorphone Hcl] Anaphylaxis  . Acyclovir And Related     Unknown reaction  . Crestor [Rosuvastatin Calcium] Other (See Comments)    Muscle pain  . Gabapentin Swelling    Swelling in feet, and in legs  . Aleve [Naproxen Sodium] Rash  . Lyrica [Pregabalin] Other (See Comments)    Swelling in feet, and in legs   Past Medical History:  Diagnosis Date  . Cataract   . Dyspnea   . Osteoarthritis   . Raynaud disease   . Scleroderma ([place])   . Thyroid disease    hypothyroidism    Current Outpatient Medications:  .  acetaminophen  (TYLENOL) 500 MG tablet, Take 1,000 mg by mouth every 6 (six) hours as needed for moderate pain or headache., Disp: , Rfl:  .  Cholecalciferol (VITAMIN D3) 2000 units TABS, Take 2,000 Units by mouth daily. , Disp: , Rfl:  .  clonazePAM (KLONOPIN) 0.5 MG tablet, Take 1 tablet (0.5 mg total) by mouth 2 (two) times daily as needed for anxiety., Disp: 60 tablet, Rfl: 2 .  cloNIDine (CATAPRES) 0.2 MG tablet, TAKE 1 TABLET 2 TIMES A DAY, Disp: 60 tablet, Rfl: 0 .  ezetimibe (ZETIA) 10 MG tablet, Take 1 tablet (10 mg total) by mouth daily., Disp: 90 tablet, Rfl: 3 .  FLUoxetine (PROZAC) 40 MG capsule, Take 1 capsule (40 mg total) by mouth daily., Disp: 90 capsule, Rfl: 3 .  lisinopril (ZESTRIL) 40 MG tablet, Take 1 tablet (40 mg total) by mouth daily., Disp: 90 tablet, Rfl: 1 .  NIFEdipine (PROCARDIA XL/NIFEDICAL XL) 60 MG 24 hr tablet, Take 1 tablet (60 mg total) by mouth daily., Disp: 90 tablet, Rfl: 1 .  omeprazole (PRILOSEC) 20 MG capsule, TAKE 1 TABLET DAILY, Disp: 90 capsule, Rfl: 1 .  Probiotic Product ([place]) CAPS, Take 1 capsule by mouth daily. , Disp: , Rfl:  .  PROLIA 60 MG/ML SOSY injection, Inject 60 mg into the skin every 6 (six) months., Disp: , Rfl:  .  sildenafil (REVATIO) 20 MG tablet, Take 1 tablet (20 mg total) by mouth 2 (two) times daily., Disp: 180 tablet,  Rfl: 1 .  SYNTHROID 75 MCG tablet, Take 1 tablet (75 mcg total) by mouth daily before breakfast., Disp: 90 tablet, Rfl: 1 .  ferrous sulfate 325 (65 FE) MG tablet, Take 1 tablet (325 mg total) by mouth 2 (two) times daily with a meal. (Patient not taking: Reported on 06/20/2020), Disp: 90 tablet, Rfl: 1 Social History   Socioeconomic History  . Marital status: Married    Spouse name: Not on file  . Number of children: 2  . Years of education: Not on file  . Highest education level: Some college, no degree  Occupational History  . Occupation: retired Occupational psychologist at [employer]  Tobacco Use  . Smoking status:  Never Smoker  . Smokeless tobacco: Never Used  . Tobacco comment: passive tobacco smoke exposure as child  Vaping Use  . Vaping Use: Never used  Substance and Sexual Activity  . Alcohol use: No  . Drug use: No  . Sexual activity: Not Currently    Birth control/protection: Surgical  Other Topics Concern  . Not on file  Social History Narrative   Regular exercise: walking   Caffeine use: 1 cup of coffee daily   Social Determinants of Health   Financial Resource Strain: Not on file  Food Insecurity: Not on file  Transportation Needs: Not on file  Physical Activity: Not on file  Stress: Not on file  Social Connections: Not on file  Intimate Partner Violence: Not on file   Family History  Problem Relation Age of Onset  . Pancreatic cancer Father   . Raynaud syndrome Father   . Cancer Father        pancreatic  . Rashes / Skin problems Daughter        possibly scleraderma  . Hip fracture Mother   . Mental illness Mother        attempted suicide at 30 yo    Review of Systems  Negative unless specially indicated above in HPI.  Objective: Office vital signs reviewed. BP 107/61   Pulse 76   Temp 98 F (36.7 C) (Temporal)   Ht 5' 2" (1.575 m)   Wt 106 lb 8 oz (48.3 kg)   BMI 19.48 kg/m   Physical Examination:  Physical Exam Vitals and nursing note reviewed.  Constitutional:      General: She is not in acute distress.    Appearance: Normal appearance. She is well-developed. She is not ill-appearing, toxic-appearing or diaphoretic.  HENT:     Head: Normocephalic and atraumatic.     Mouth/Throat:     Mouth: Mucous membranes are moist.     Pharynx: Oropharynx is clear.  Eyes:     General: No scleral icterus.    Extraocular Movements: Extraocular movements intact.     Pupils: Pupils are equal, round, and reactive to light.  Neck:     Vascular: No carotid bruit.  Cardiovascular:     Rate and Rhythm: Normal rate and regular rhythm.     Pulses: Normal pulses.      Heart sounds: Murmur heard.  No friction rub. No gallop.   Pulmonary:     Effort: Pulmonary effort is normal. No respiratory distress.     Breath sounds: Normal breath sounds.  Abdominal:     General: Bowel sounds are normal. There is no distension.     Palpations: Abdomen is soft.     Tenderness: There is no abdominal tenderness.  Musculoskeletal:     Cervical back: Neck supple.  Right lower leg: No edema.     Left lower leg: No edema.  Lymphadenopathy:     Cervical: No cervical adenopathy.  Skin:    General: Skin is warm and dry.     Capillary Refill: Capillary refill takes less than 2 seconds.  Neurological:     General: No focal deficit present.     Mental Status: She is alert and oriented to person, place, and time.     Cranial Nerves: No facial asymmetry.     Motor: No weakness.     Gait: Gait normal.  Psychiatric:        Mood and Affect: Mood normal.        Speech: Speech normal.        Behavior: Behavior normal.      Results for orders placed or performed in visit on 05/14/20  CBC with Differential/Platelet  Result Value Ref Range   WBC 8.8 3.4 - 10.8 x10E3/uL   RBC 3.47 (L) 3.77 - 5.28 x10E6/uL   Hemoglobin 9.3 (L) 11.1 - 15.9 g/dL   Hematocrit 30.3 (L) 34.0 - 46.6 %   MCV 87 79 - 97 fL   MCH 26.8 26.6 - 33.0 pg   MCHC 30.7 (L) 31.5 - 35.7 g/dL   RDW 13.7 11.7 - 15.4 %   Platelets 333 150 - 450 x10E3/uL   Neutrophils 63 Not Estab. %   Lymphs 26 Not Estab. %   Monocytes 8 Not Estab. %   Eos 2 Not Estab. %   Basos 1 Not Estab. %   Neutrophils Absolute 5.6 1.4 - 7.0 x10E3/uL   Lymphocytes Absolute 2.3 0.7 - 3.1 x10E3/uL   Monocytes Absolute 0.7 0.1 - 0.9 x10E3/uL   EOS (ABSOLUTE) 0.2 0.0 - 0.4 x10E3/uL   Basophils Absolute 0.1 0.0 - 0.2 x10E3/uL   Immature Granulocytes 0 Not Estab. %   Immature Grans (Abs) 0.0 0.0 - 0.1 x10E3/uL   Hematology Comments: Note:   CMP14+EGFR  Result Value Ref Range   Glucose 124 (H) 65 - 99 mg/dL   BUN 11 8 - 27 mg/dL    Creatinine, Ser 0.81 0.57 - 1.00 mg/dL   GFR calc non Af Amer 70 >59 mL/min/1.73   GFR calc Af Amer 80 >59 mL/min/1.73   BUN/Creatinine Ratio 14 12 - 28   Sodium 139 134 - 144 mmol/L   Potassium 4.3 3.5 - 5.2 mmol/L   Chloride 105 96 - 106 mmol/L   CO2 24 20 - 29 mmol/L   Calcium 8.3 (L) 8.7 - 10.3 mg/dL   Total Protein 6.7 6.0 - 8.5 g/dL   Albumin 4.3 3.7 - 4.7 g/dL   Globulin, Total 2.4 1.5 - 4.5 g/dL   Albumin/Globulin Ratio 1.8 1.2 - 2.2   Bilirubin Total <0.2 0.0 - 1.2 mg/dL   Alkaline Phosphatase 45 44 - 121 IU/L   AST 12 0 - 40 IU/L   ALT 5 0 - 32 IU/L  Lipid panel  Result Value Ref Range   Cholesterol, Total 166 100 - 199 mg/dL   Triglycerides 168 (H) 0 - 149 mg/dL   HDL 45 >39 mg/dL   VLDL Cholesterol Cal 29 5 - 40 mg/dL   LDL Chol Calc (NIH) 92 0 - 99 mg/dL   Chol/HDL Ratio 3.7 0.0 - 4.4 ratio  Thyroid Panel With TSH  Result Value Ref Range   TSH 1.680 0.450 - 4.500 uIU/mL   T4, Total 8.8 4.5 - 12.0 ug/dL   T3 Uptake Ratio  28 24 - 39 %   Free Thyroxine Index 2.5 1.2 - 4.9  Ferritin  Result Value Ref Range   Ferritin 5 (L) 15 - 150 ng/mL     Assessment/ Plan: Haley Trevino was seen today for headache.  Diagnoses and all orders for this visit:  Fatigue, unspecified type ? Dehydration. Patient reports feeling significantly better. Declined lab work today. Restart iron supplement. Continue hydration measures.   Dehydration Declined lab work today. Continue hydration measures.   Follow up as needed for new or worsening symptoms, or if symptoms persist.   The above assessment and management plan was discussed with the patient. The patient verbalized understanding of and has agreed to the management plan. Patient is aware to call the clinic if symptoms persist or worsen. Patient is aware when to return to the clinic for a follow-up visit. Patient educated on when it is appropriate to go to the emergency department.   Marjorie Smolder, FNP-C [facility] [address] [place], [place] [address] [phone]

## 2020-07-16 ENCOUNTER — Ambulatory Visit (INDEPENDENT_AMBULATORY_CARE_PROVIDER_SITE_OTHER): Payer: Medicare Other | Admitting: Nurse Practitioner

## 2020-07-16 ENCOUNTER — Other Ambulatory Visit: Payer: Self-pay

## 2020-07-16 ENCOUNTER — Encounter: Payer: Self-pay | Admitting: Nurse Practitioner

## 2020-07-16 VITALS — BP 115/56 | HR 81 | Temp 98.6°F | Ht 62.0 in | Wt 109.2 lb

## 2020-07-16 DIAGNOSIS — M25511 Pain in right shoulder: Secondary | ICD-10-CM | POA: Diagnosis not present

## 2020-07-16 DIAGNOSIS — M2550 Pain in unspecified joint: Secondary | ICD-10-CM

## 2020-07-16 MED ORDER — PREDNISONE 10 MG (21) PO TBPK: ORAL_TABLET | ORAL | 0 refills | Status: DC

## 2020-07-16 NOTE — Progress Notes (Signed)
Established Patient Office Visit  Subjective:  Patient ID: Haley Trevino, female    DOB: December 17, 1940  Age: 80 y.o. MRN: [id number]  CC:  Chief Complaint  Patient presents with  . Joint Pain    Patient states she has been having join and muscle pain since Feb but has gotten worse. Patient is requesting for a refill of prednisone.     HPI Haley Trevino presents for Pain  She reports chronic generalized body aches/joint pain pain. was not an injury that may have caused the pain. The pain started about a year ago and is gradually worsening. The pain does radiate bilateral shoulders. The pain is described as aching and soreness, is moderate in intensity, occurring intermittently. Symptoms are worse in the: morning, mid-day, afternoon, evening  Aggravating factors: Movement Relieving factors: medication Prednisone.  She has tried NSAIDs and prescription pain relievers with mild relief.   ---------------------------------------------------------------------------------------------------   Past Medical History:  Diagnosis Date  . Cataract   . Dyspnea   . Osteoarthritis   . Raynaud disease   . Scleroderma ([place])   . Thyroid disease    hypothyroidism    Past Surgical History:  Procedure Laterality Date  . BREAST ENHANCEMENT SURGERY  1975  . BREAST IMPLANT REMOVAL Bilateral 04/23/2016   Procedure: REMOVALBILATERAL BREAST IMPLANTS;  Surgeon: Wallace Going, DO;  Location: [place];  Service: Plastics;  Laterality: Bilateral;  . CHOLECYSTECTOMY  1973  . HAND SURGERY  08/2012; 11/2012  . IR RADIOLOGIST EVAL & MGMT  07/27/2017  . IR SACROPLASTY BILATERAL  07/31/2017  . IR VERTEBROPLASTY CERV/THOR BX INC UNI/BIL INC/INJECT/IMAGING  03/13/2020  . IR VERTEBROPLASTY EA ADDL (T&LS) BX INC UNI/BIL INC INJECT/IMAGING  03/14/2020  . MELANOMA EXCISION     at 9 yrs of age  . ORIF HIP FRACTURE Right 02/22/2014   Procedure: OPEN REDUCTION INTERNAL FIXATION RIGHT HIP;   Surgeon: Sanjuana Kava, MD;  Location: [facility];  Service: Orthopedics;  Laterality: Right;  . TOTAL ABDOMINAL HYSTERECTOMY  1974    Family History  Problem Relation Age of Onset  . Pancreatic cancer Father   . Raynaud syndrome Father   . Cancer Father        pancreatic  . Rashes / Skin problems Daughter        possibly scleraderma  . Hip fracture Mother   . Mental illness Mother        attempted suicide at 64 yo    Social History   Socioeconomic History  . Marital status: Married    Spouse name: Not on file  . Number of children: 2  . Years of education: Not on file  . Highest education level: Some college, no degree  Occupational History  . Occupation: retired Occupational psychologist at [employer]  Tobacco Use  . Smoking status: Never Smoker  . Smokeless tobacco: Never Used  . Tobacco comment: passive tobacco smoke exposure as child  Vaping Use  . Vaping Use: Never used  Substance and Sexual Activity  . Alcohol use: No  . Drug use: No  . Sexual activity: Not Currently    Birth control/protection: Surgical  Other Topics Concern  . Not on file  Social History Narrative   Regular exercise: walking   Caffeine use: 1 cup of coffee daily   Social Determinants of Health   Financial Resource Strain: Not on file  Food Insecurity: Not on file  Transportation Needs: Not on file  Physical Activity: Not on file  Stress: Not on file  Social Connections: Not on file  Intimate Partner Violence: Not on file    Outpatient Medications Prior to Visit  Medication Sig Dispense Refill  . acetaminophen (TYLENOL) 500 MG tablet Take 1,000 mg by mouth every 6 (six) hours as needed for moderate pain or headache.    . Cholecalciferol (VITAMIN D3) 2000 units TABS Take 2,000 Units by mouth daily.     . clonazePAM (KLONOPIN) 0.5 MG tablet Take 1 tablet (0.5 mg total) by mouth 2 (two) times daily as needed for anxiety. 60 tablet 2  . cloNIDine (CATAPRES) 0.2 MG tablet TAKE 1 TABLET 2 TIMES A DAY 60  tablet 0  . ezetimibe (ZETIA) 10 MG tablet Take 1 tablet (10 mg total) by mouth daily. 90 tablet 3  . ferrous sulfate 325 (65 FE) MG tablet Take 1 tablet (325 mg total) by mouth 2 (two) times daily with a meal. 90 tablet 1  . FLUoxetine (PROZAC) 40 MG capsule Take 1 capsule (40 mg total) by mouth daily. 90 capsule 3  . lisinopril (ZESTRIL) 40 MG tablet Take 1 tablet (40 mg total) by mouth daily. 90 tablet 1  . NIFEdipine (PROCARDIA XL/NIFEDICAL XL) 60 MG 24 hr tablet Take 1 tablet (60 mg total) by mouth daily. 90 tablet 1  . omeprazole (PRILOSEC) 20 MG capsule TAKE 1 TABLET DAILY 90 capsule 1  . Probiotic Product ([place]) CAPS Take 1 capsule by mouth daily.     Marland Kitchen PROLIA 60 MG/ML SOSY injection Inject 60 mg into the skin every 6 (six) months.    . sildenafil (REVATIO) 20 MG tablet Take 1 tablet (20 mg total) by mouth 2 (two) times daily. 180 tablet 1  . SYNTHROID 75 MCG tablet Take 1 tablet (75 mcg total) by mouth daily before breakfast. 90 tablet 1   No facility-administered medications prior to visit.    Allergies  Allergen Reactions  . Dilaudid [Hydromorphone Hcl] Anaphylaxis  . Acyclovir And Related     Unknown reaction  . Crestor [Rosuvastatin Calcium] Other (See Comments)    Muscle pain  . Gabapentin Swelling    Swelling in feet, and in legs  . Aleve [Naproxen Sodium] Rash  . Lyrica [Pregabalin] Other (See Comments)    Swelling in feet, and in legs    ROS Review of Systems  Constitutional: Negative.   HENT: Negative.   Eyes: Negative.   Respiratory: Negative.   Cardiovascular: Negative.   Gastrointestinal: Negative.   Musculoskeletal: Positive for arthralgias and myalgias.  Skin: Negative.   All other systems reviewed and are negative.     Objective:    Physical Exam Vitals reviewed.  Constitutional:      Appearance: Normal appearance.  HENT:     Head: Normocephalic.     Nose: Nose normal.  Eyes:     Conjunctiva/sclera: Conjunctivae normal.      Pupils: Pupils are equal, round, and reactive to light.  Cardiovascular:     Rate and Rhythm: Normal rate and regular rhythm.     Pulses: Normal pulses.     Heart sounds: Normal heart sounds.  Pulmonary:     Effort: Pulmonary effort is normal.     Breath sounds: Normal breath sounds.  Abdominal:     General: Bowel sounds are normal.  Musculoskeletal:        General: Tenderness present.  Skin:    General: Skin is warm.  Neurological:     Mental Status: She is alert and oriented  to person, place, and time.  Psychiatric:        Mood and Affect: Mood normal.        Behavior: Behavior normal.     BP (!) 115/56   Pulse 81   Temp 98.6 F (37 C) (Temporal)   Ht 5\' 2"  (1.575 m)   Wt 109 lb 3.2 oz (49.5 kg)   SpO2 96%   BMI 19.97 kg/m  Wt Readings from Last 3 Encounters:  07/16/20 109 lb 3.2 oz (49.5 kg)  06/20/20 106 lb 8 oz (48.3 kg)  05/14/20 109 lb (49.4 kg)      Lab Results  Component Value Date   TSH 1.680 05/14/2020   Lab Results  Component Value Date   WBC 8.8 05/14/2020   HGB 9.3 (L) 05/14/2020   HCT 30.3 (L) 05/14/2020   MCV 87 05/14/2020   PLT 333 05/14/2020   Lab Results  Component Value Date   NA 139 05/14/2020   K 4.3 05/14/2020   CO2 24 05/14/2020   GLUCOSE 124 (H) 05/14/2020   BUN 11 05/14/2020   CREATININE 0.81 05/14/2020   BILITOT <0.2 05/14/2020   ALKPHOS 45 05/14/2020   AST 12 05/14/2020   ALT 5 05/14/2020   PROT 6.7 05/14/2020   ALBUMIN 4.3 05/14/2020   CALCIUM 8.3 (L) 05/14/2020   ANIONGAP 8 03/13/2020   Lab Results  Component Value Date   CHOL 166 05/14/2020   Lab Results  Component Value Date   HDL 45 05/14/2020   Lab Results  Component Value Date   LDLCALC 92 05/14/2020   Lab Results  Component Value Date   TRIG 168 (H) 05/14/2020   Lab Results  Component Value Date   CHOLHDL 3.7 05/14/2020   No results found for: HGBA1C    Assessment & Plan:   Problem List Items Addressed This Visit      Other   Pain in  right shoulder (Chronic)    Pain in the right shoulder not well controlled.  Patient is reporting worsening pain, prednisone pack ordered, advised patient to follow-up with surgical group that took out her breast implant.  Patient is reporting that pain started and worsening since after taking out her bilateral breast implants.  Follow-up with worsening hours of symptoms.      Arthralgia - Primary    Generalized body pain not well controlled.  Patient reports prednisone helped with pain management and prescription pain medicine.  Reordered prednisone pack.  Provided education to patient with printed handouts given.  Follow-up with worsening hours of symptoms.  Rx sent to pharmacy.      Relevant Medications   predniSONE (STERAPRED UNI-PAK 21 TAB) 10 MG (21) TBPK tablet      Meds ordered this encounter  Medications  . predniSONE (STERAPRED UNI-PAK 21 TAB) 10 MG (21) TBPK tablet    Sig: 6 tablet day 1, 5 tablet day 2, 4 tab day 3, 3 tabs day 4, 2 tab day 5, 1 tab day 6,    Dispense:  1 each    Refill:  0    Order Specific Question:   Supervising Provider    Answer:   Janora Norlander [[id number]]    Follow-up: Return if symptoms worsen or fail to improve.    Ivy Lynn, NP

## 2020-07-16 NOTE — Assessment & Plan Note (Signed)
Generalized body pain not well controlled.  Patient reports prednisone helped with pain management and prescription pain medicine.  Reordered prednisone pack.  Provided education to patient with printed handouts given.  Follow-up with worsening hours of symptoms.  Rx sent to pharmacy.

## 2020-07-16 NOTE — Patient Instructions (Signed)
Joint Pain  Joint pain can be caused by many things. It is likely to go away if you follow instructions from your doctor for taking care of yourself at home. Sometimes, you may need more treatment. Follow these instructions at home: Managing pain, stiffness, and swelling  If told, put ice on the painful area. To do this: ? If you have a removable elastic bandage, sling, or splint, take it off as told by your doctor. ? Put ice in a plastic bag. ? Place a towel between your skin and the bag. ? Leave the ice on for 20 minutes, 2-3 times a day. ? Take off the ice if your skin turns bright red. This is very important. If you cannot feel pain, heat, or cold, you have a greater risk of damage to the area.  Move your fingers or toes below the painful joint often.  Raise the painful joint above the level of your heart while you are sitting or lying down.  If told, put heat on the painful area. Do this as often as told by your doctor. Use the heat source that your doctor recommends, such as a moist heat pack or a heating pad. ? Place a towel between your skin and the heat source. ? Leave the heat on for 20-30 minutes. ? Take off the heat if your skin gets bright red. This is especially important if you are unable to feel pain, heat, or cold. You may have a greater risk of getting burned.      Activity  Rest the painful joint for as long as told by your doctor. Do not do things that cause pain or make your pain worse.  Begin exercising or stretching the affected area, as told by your doctor. Ask your doctor what types of exercise are safe for you.  Return to your normal activities when your doctor says that it is safe. If you have an elastic bandage, sling, or splint:  Wear it as told by your doctor. Take it only as told by your doctor.  Loosen it your fingers or toes below the joint: ? Tingle. ? Become numb. ? Get cold and blue.  Keep it clean.  Ask your doctor if you should take it  off before bathing.  If it is not waterproof: ? Do not let it get wet. ? Cover it with a watertight covering when you take a bath or shower. General instructions  Take over-the-counter and prescription medicines only as told by your doctor. This may include medicines taken by mouth or applied to the skin.  Do not smoke or use any products that contain nicotine or tobacco. If you need help quitting, ask your doctor.  Keep all follow-up visits as told by your doctor. This is important. Contact a doctor if:  You have pain that gets worse and does not get better with medicine.  Your joint pain does not get better in 3 days.  You have more bruising or swelling.  You have a fever.  You lose 10 lb (4.5 kg) or more without trying. Get help right away if:  You cannot move the joint.  Your fingers or toes tingle, become numb. or get cold and blue.  You have a fever along with a joint that is red, warm, and swollen. Summary  Joint pain can be caused by many things. It often goes away if you follow instructions from your doctor for taking care of yourself at home.  Rest the painful joint   for as long as told. Do not do things that cause pain or make your pain worse.  Take over-the-counter and prescription medicines only as told by your doctor. This information is not intended to replace advice given to you by your health care provider. Make sure you discuss any questions you have with your health care provider. Document Revised: 10/05/2019 Document Reviewed: 10/05/2019 Elsevier Patient Education  2021 Elsevier Inc.  

## 2020-07-16 NOTE — Assessment & Plan Note (Signed)
Pain in the right shoulder not well controlled.  Patient is reporting worsening pain, prednisone pack ordered, advised patient to follow-up with surgical group that took out her breast implant.  Patient is reporting that pain started and worsening since after taking out her bilateral breast implants.  Follow-up with worsening hours of symptoms.

## 2020-07-25 ENCOUNTER — Other Ambulatory Visit: Payer: Self-pay | Admitting: Nurse Practitioner

## 2020-07-25 DIAGNOSIS — F419 Anxiety disorder, unspecified: Secondary | ICD-10-CM

## 2020-07-26 ENCOUNTER — Ambulatory Visit: Payer: Medicare Other | Admitting: Licensed Clinical Social Worker

## 2020-07-26 DIAGNOSIS — M159 Polyosteoarthritis, unspecified: Secondary | ICD-10-CM

## 2020-07-26 DIAGNOSIS — I7300 Raynaud's syndrome without gangrene: Secondary | ICD-10-CM

## 2020-07-26 DIAGNOSIS — M810 Age-related osteoporosis without current pathological fracture: Secondary | ICD-10-CM

## 2020-07-26 DIAGNOSIS — I10 Essential (primary) hypertension: Secondary | ICD-10-CM

## 2020-07-26 DIAGNOSIS — E039 Hypothyroidism, unspecified: Secondary | ICD-10-CM

## 2020-07-26 DIAGNOSIS — F3342 Major depressive disorder, recurrent, in full remission: Secondary | ICD-10-CM

## 2020-07-26 DIAGNOSIS — E785 Hyperlipidemia, unspecified: Secondary | ICD-10-CM

## 2020-07-26 DIAGNOSIS — K219 Gastro-esophageal reflux disease without esophagitis: Secondary | ICD-10-CM

## 2020-07-26 NOTE — Chronic Care Management (AMB) (Signed)
Chronic Care Management    Clinical Social Work Follow Up Note  07/26/2020 Name: Haley Trevino MRN: [id number] DOB: [birth year]  Haley Trevino is a 80 y.o. year old female who is a primary care patient of [name], [place]. The CCM team was consulted for assistance with [name] .   Review of patient status, including review of consultants reports, other relevant assessments, and collaboration with appropriate care team members and the patient's provider was performed as part of comprehensive patient evaluation and provision of chronic care management services.    SDOH (Social Determinants of Health) assessments performed: No; risk for depression; risk for tobacco use; risk for stress; risk for physical inactivity  [facility] Visit from 05/14/2020 in [place]  PHQ-9 Total Score 0      GAD 7 : Generalized Anxiety Score 02/20/2020 10/24/2019 04/14/2019  Nervous, Anxious, on Edge 3 3 2   Control/stop worrying 0 1 1  Worry too much - different things 0 1 1  Trouble relaxing 3 0 1  Restless 0 0 0  Easily annoyed or irritable 0 0 0  Afraid - awful might happen 1 0 1  Total GAD 7 Score 7 5 6   Anxiety Difficulty Not difficult at all Somewhat difficult -    Outpatient Encounter Medications as of 07/26/2020  Medication Sig  . acetaminophen (TYLENOL) 500 MG tablet Take 1,000 mg by mouth every 6 (six) hours as needed for moderate pain or headache.  . Cholecalciferol (VITAMIN D3) 2000 units TABS Take 2,000 Units by mouth daily.   . clonazePAM (KLONOPIN) 0.5 MG tablet Take 1 tablet (0.5 mg total) by mouth 2 (two) times daily as needed for anxiety.  . cloNIDine (CATAPRES) 0.2 MG tablet TAKE 1 TABLET 2 TIMES A DAY  . ezetimibe (ZETIA) 10 MG tablet Take 1 tablet (10 mg total) by mouth daily.  . ferrous sulfate 325 (65 FE) MG tablet Take 1 tablet (325 mg total) by mouth 2 (two) times daily with a meal.  . FLUoxetine (PROZAC) 40 MG capsule  Take 1 capsule (40 mg total) by mouth daily.  Marland Kitchen lisinopril (ZESTRIL) 40 MG tablet Take 1 tablet (40 mg total) by mouth daily.  Marland Kitchen NIFEdipine (PROCARDIA XL/NIFEDICAL XL) 60 MG 24 hr tablet Take 1 tablet (60 mg total) by mouth daily.  Marland Kitchen omeprazole (PRILOSEC) 20 MG capsule TAKE 1 TABLET DAILY  . predniSONE (STERAPRED UNI-PAK 21 TAB) 10 MG (21) TBPK tablet 6 tablet day 1, 5 tablet day 2, 4 tab day 3, 3 tabs day 4, 2 tab day 5, 1 tab day 6,  . Probiotic Product ([place]) CAPS Take 1 capsule by mouth daily.   Marland Kitchen PROLIA 60 MG/ML SOSY injection Inject 60 mg into the skin every 6 (six) months.  . sildenafil (REVATIO) 20 MG tablet Take 1 tablet (20 mg total) by mouth 2 (two) times daily.  Marland Kitchen SYNTHROID 75 MCG tablet Take 1 tablet (75 mcg total) by mouth daily before breakfast.   No facility-administered encounter medications on file as of 07/26/2020.    Goals    .  Client will talk with LCSW in next 30 days to discuss stress issues of client and managing stress issues faced (pt-stated)      CARE PLAN ENTRY   Current Barriers:  . Pain barriers in client with chronic diagnoses of OA, Hypothyroidism, Depression, GERD, HTN, HLD, Osteoporosis, [name] . Anemia  . Fatigue . Potential for falls  Clinical Social Work  Clinical Goal(s):  Marland Kitchen LCSW will call client in next 30 days to talk with client about managing stress issues of client  Interventions: Talked with client about client needs Talked with client about her upcoming medical appointments (she has appointment with [name] FNP on 08/16/2020 Talked with client about medication procurement Talked with client about ambulation needs of client  Talked with client  about decreased energy of client Talked with client about sleeping challenges of client Talked with client about pain issues of client Talked with client about vision needs of client Talked with client about her reaction to receiving her COVID 19 booster shot  in November of 2021 Talked with client about client shortness of breath Talked with client about weakness and shakiness of client (short of breath and decreased energy) Talked with client about her upcoming client appointments (she said she has appointment on September 04, 2020 with medical provider in [place], [place]) Provided counseling support for client Encouraged client to talk with RNCM to discuss nursing needs of client Talked with client about stress issues of client   Patient Self Care Activities:  Does daily ADLS independently Takes medications as prescribed Attends medical appointments  Patient Self Care Deficits . Anemia . Weakness, fatigue . Fall potential .   Initial goal documentation     Follow Up Plan: LCSW to call client in next 4 weeks to talk with her about stress issues faced by client and to talk about client strategies for managing stress issues faced  Norva Riffle.Hinda Lindor MSW, LCSW Licensed Clinical Social Worker [facility] Care Management [phone]

## 2020-07-26 NOTE — Patient Instructions (Addendum)
Licensed Clinical Education officer, museum Visit Information  Goals we discussed today:    Client will talk with LCSW in next 30 days to discuss stress issues of client and managing stress issues faced (pt-stated)          CARE PLAN ENTRY   Current Barriers:   Pain barriers in client with chronic diagnoses of OA, Hypothyroidism, Depression, GERD, HTN, HLD, Osteoporosis, [name]  Anemia   Fatigue  Potential for falls  Clinical Social Work Clinical Goal(s):   LCSW will call client in next 30 days to talk with client about managing stress issues of client  Interventions: Talked with client about client needs Talked with client about her upcoming medical appointments (she has appointment with [name] FNP on 08/16/2020 Talked with client about medication procurement Talked with client about ambulation needs of client  Talked with client  about decreased energy of client Talked with client about sleeping challenges of client Talked with client about pain issues of client Talked with client about vision needs of client Talked with client about her reaction to receiving her COVID 19 booster shot in November of 2021 Talked with client about client shortness of breath Talked with client about weakness and shakiness of client (short of breath and decreased energy) Talked with client about her upcoming client appointments (she said she has appointment on September 04, 2020 with medical provider in [place], [place]) Provided counseling support for client Encouraged client to talk with RNCM to discuss nursing needs of client Talked with client about stress issues of client   Patient Self Care Activities:  Does daily ADLS independently Takes medications as prescribed Attends medical appointments  Patient Self Care Deficits  Anemia  Weakness, fatigue  Fall potential    Initial goal documentation     Follow Up Plan: LCSW to call client in next 4 weeks to  talk with her about stress issues faced by client and to talk about client strategies for managing stress issues faced  Materials Provided: No  The patient verbalized understanding of instructions provided today and declined a print copy of patient instruction materials.   Norva Riffle.Pacey Willadsen MSW, LCSW Licensed Clinical Social Worker [facility] Care Management [phone]

## 2020-08-14 ENCOUNTER — Other Ambulatory Visit: Payer: Self-pay | Admitting: Nurse Practitioner

## 2020-08-14 DIAGNOSIS — F419 Anxiety disorder, unspecified: Secondary | ICD-10-CM

## 2020-08-14 DIAGNOSIS — I10 Essential (primary) hypertension: Secondary | ICD-10-CM

## 2020-08-16 ENCOUNTER — Ambulatory Visit (INDEPENDENT_AMBULATORY_CARE_PROVIDER_SITE_OTHER): Payer: Medicare Other | Admitting: Nurse Practitioner

## 2020-08-16 ENCOUNTER — Ambulatory Visit (INDEPENDENT_AMBULATORY_CARE_PROVIDER_SITE_OTHER): Payer: Medicare Other

## 2020-08-16 ENCOUNTER — Encounter: Payer: Self-pay | Admitting: Nurse Practitioner

## 2020-08-16 ENCOUNTER — Other Ambulatory Visit: Payer: Self-pay

## 2020-08-16 VITALS — BP 113/56 | HR 90 | Temp 98.3°F | Ht 62.0 in | Wt 110.8 lb

## 2020-08-16 DIAGNOSIS — T8543XD Leakage of breast prosthesis and implant, subsequent encounter: Secondary | ICD-10-CM | POA: Diagnosis not present

## 2020-08-16 DIAGNOSIS — Z780 Asymptomatic menopausal state: Secondary | ICD-10-CM | POA: Diagnosis not present

## 2020-08-16 DIAGNOSIS — M810 Age-related osteoporosis without current pathological fracture: Secondary | ICD-10-CM

## 2020-08-16 DIAGNOSIS — K219 Gastro-esophageal reflux disease without esophagitis: Secondary | ICD-10-CM | POA: Diagnosis not present

## 2020-08-16 DIAGNOSIS — E039 Hypothyroidism, unspecified: Secondary | ICD-10-CM

## 2020-08-16 DIAGNOSIS — F419 Anxiety disorder, unspecified: Secondary | ICD-10-CM

## 2020-08-16 DIAGNOSIS — I7300 Raynaud's syndrome without gangrene: Secondary | ICD-10-CM

## 2020-08-16 DIAGNOSIS — F3342 Major depressive disorder, recurrent, in full remission: Secondary | ICD-10-CM

## 2020-08-16 DIAGNOSIS — E785 Hyperlipidemia, unspecified: Secondary | ICD-10-CM | POA: Diagnosis not present

## 2020-08-16 DIAGNOSIS — I10 Essential (primary) hypertension: Secondary | ICD-10-CM

## 2020-08-16 DIAGNOSIS — M349 Systemic sclerosis, unspecified: Secondary | ICD-10-CM

## 2020-08-16 MED ORDER — CLONAZEPAM 0.5 MG PO TABS: 0.5000 mg | ORAL_TABLET | Freq: Two times a day (BID) | ORAL | 1 refills | Status: DC | PRN

## 2020-08-16 MED ORDER — SILDENAFIL CITRATE 20 MG PO TABS: 20.0000 mg | ORAL_TABLET | Freq: Two times a day (BID) | ORAL | 1 refills | Status: DC

## 2020-08-16 MED ORDER — HYDROCODONE-ACETAMINOPHEN 5-325 MG PO TABS: 1.0000 | ORAL_TABLET | Freq: Every day | ORAL | 0 refills | Status: DC | PRN

## 2020-08-16 MED ORDER — CLONIDINE HCL 0.2 MG PO TABS: 0.2000 mg | ORAL_TABLET | Freq: Two times a day (BID) | ORAL | 1 refills | Status: DC

## 2020-08-16 MED ORDER — FLUOXETINE HCL 40 MG PO CAPS: 40.0000 mg | ORAL_CAPSULE | Freq: Every day | ORAL | 3 refills | Status: DC

## 2020-08-16 MED ORDER — OMEPRAZOLE 20 MG PO CPDR: DELAYED_RELEASE_CAPSULE | ORAL | 1 refills | Status: DC

## 2020-08-16 MED ORDER — NIFEDIPINE ER OSMOTIC RELEASE 60 MG PO TB24: 60.0000 mg | ORAL_TABLET | Freq: Every day | ORAL | 1 refills | Status: DC

## 2020-08-16 NOTE — Patient Instructions (Signed)

## 2020-08-16 NOTE — Progress Notes (Addendum)
Subjective:    Patient ID: Haley Trevino, female    DOB: [birth year], 80 y.o.   MRN: [id number]   Chief Complaint: medical management of chronic issues     HPI:  1. Primary hypertension Is having chest pain, sob and headaches. She has had her heart checked out. They think she is having issues from the breast implant stat leak out into her system. BP Readings from Last 3 Encounters:  07/16/20 (!) 115/56  06/20/20 107/61  05/14/20 (!) 127/58     2. Hyperlipidemia with target LDL less than 100 Does not watch diet. Says she has a very poor appetite. Lab Results  Component Value Date   CHOL 166 05/14/2020   HDL 45 05/14/2020   LDLCALC 92 05/14/2020   TRIG 168 (H) 05/14/2020   CHOLHDL 3.7 05/14/2020   The 10-year ASCVD risk score Mikey Bussing DC Jr., et al., 2013) is: 24.6%   Values used to calculate the score:     Age: 53 years     Sex: Female     Is Non-Hispanic African American: No     Diabetic: No     Tobacco smoker: No     Systolic Blood Pressure: 024 mmHg     Is BP treated: Yes     HDL Cholesterol: 45 mg/dL     Total Cholesterol: 166 mg/dL   3. Acquired hypothyroidism No problems that she is aware of. Lab Results  Component Value Date   TSH 1.680 05/14/2020     4. Gastroesophageal reflux disease without esophagitis Is on omeprazole daily and is doing well.  5. [name] without gangrene Is on procardia and sildenafil.she has to use hand warmers all the time.  6. Age-related osteoporosis without current pathological fracture Does no actual exercise due  To chronic pain. She is on prolia. Has not had a dexascan in several years.  7. Recurrent major depressive disorder, in full remission ([place]) Is on prozac daily and is doing well.  Depression screen [facility] 2/9 08/16/2020 06/20/2020 05/14/2020  Decreased Interest 0 0 0  Down, Depressed, Hopeless 1 0 0  PHQ - 2 Score 1 0 0  Altered sleeping - - 0  Tired, decreased energy - - 0  Change in appetite - - 0   Feeling bad or failure about yourself  - - 0  Trouble concentrating - - 0  Moving slowly or fidgety/restless - - 0  Suicidal thoughts - - 0  PHQ-9 Score - - 0  Difficult doing work/chores - - Not difficult at all  Some recent data might be hidden     8. SCLERODERMA Sees rheumatology every 6 months. No recent change smade to plan of care.    Outpatient Encounter Medications as of 08/16/2020  Medication Sig  . acetaminophen (TYLENOL) 500 MG tablet Take 1,000 mg by mouth every 6 (six) hours as needed for moderate pain or headache.  . Cholecalciferol (VITAMIN D3) 2000 units TABS Take 2,000 Units by mouth daily.   . clonazePAM (KLONOPIN) 0.5 MG tablet Take 1 tablet (0.5 mg total) by mouth 2 (two) times daily as needed for anxiety.  . cloNIDine (CATAPRES) 0.2 MG tablet TAKE 1 TABLET 2 TIMES A DAY  . ezetimibe (ZETIA) 10 MG tablet Take 1 tablet (10 mg total) by mouth daily.  . ferrous sulfate 325 (65 FE) MG tablet Take 1 tablet (325 mg total) by mouth 2 (two) times daily with a meal.  . FLUoxetine (PROZAC) 40 MG capsule Take 1 capsule (  40 mg total) by mouth daily.  Marland Kitchen lisinopril (ZESTRIL) 40 MG tablet Take 1 tablet (40 mg total) by mouth daily.  Marland Kitchen NIFEdipine (PROCARDIA XL/NIFEDICAL XL) 60 MG 24 hr tablet Take 1 tablet (60 mg total) by mouth daily.  Marland Kitchen omeprazole (PRILOSEC) 20 MG capsule TAKE 1 TABLET DAILY  . predniSONE (STERAPRED UNI-PAK 21 TAB) 10 MG (21) TBPK tablet 6 tablet day 1, 5 tablet day 2, 4 tab day 3, 3 tabs day 4, 2 tab day 5, 1 tab day 6,  . Probiotic Product ([place]) CAPS Take 1 capsule by mouth daily.   Marland Kitchen PROLIA 60 MG/ML SOSY injection Inject 60 mg into the skin every 6 (six) months.  . sildenafil (REVATIO) 20 MG tablet Take 1 tablet (20 mg total) by mouth 2 (two) times daily.  Marland Kitchen SYNTHROID 75 MCG tablet Take 1 tablet (75 mcg total) by mouth daily before breakfast.     Past Surgical History:  Procedure Laterality Date  . BREAST ENHANCEMENT SURGERY  1975  .  BREAST IMPLANT REMOVAL Bilateral 04/23/2016   Procedure: REMOVALBILATERAL BREAST IMPLANTS;  Surgeon: Wallace Going, DO;  Location: [place];  Service: Plastics;  Laterality: Bilateral;  . CHOLECYSTECTOMY  1973  . HAND SURGERY  08/2012; 11/2012  . IR RADIOLOGIST EVAL & MGMT  07/27/2017  . IR SACROPLASTY BILATERAL  07/31/2017  . IR VERTEBROPLASTY CERV/THOR BX INC UNI/BIL INC/INJECT/IMAGING  03/13/2020  . IR VERTEBROPLASTY EA ADDL (T&LS) BX INC UNI/BIL INC INJECT/IMAGING  03/14/2020  . MELANOMA EXCISION     at 65 yrs of age  . ORIF HIP FRACTURE Right 02/22/2014   Procedure: OPEN REDUCTION INTERNAL FIXATION RIGHT HIP;  Surgeon: Sanjuana Kava, MD;  Location: [facility];  Service: Orthopedics;  Laterality: Right;  . TOTAL ABDOMINAL HYSTERECTOMY  1974    Family History  Problem Relation Age of Onset  . Pancreatic cancer Father   . Raynaud syndrome Father   . Cancer Father        pancreatic  . Rashes / Skin problems Daughter        possibly scleraderma  . Hip fracture Mother   . Mental illness Mother        attempted suicide at 5 yo    New complaints: She is seeing specialist for her implant illness next month. She had leaking breast implant on left. Has had them removed.  Social history: lives with her husband who is ill  Controlled substance contract: n/a    Review of Systems  Constitutional: Negative for diaphoresis.  Eyes: Negative for pain.  Respiratory: Positive for shortness of breath.   Cardiovascular: Positive for chest pain. Negative for palpitations and leg swelling.  Gastrointestinal: Negative for abdominal pain.  Endocrine: Negative for polydipsia.  Musculoskeletal: Positive for arthralgias, back pain and myalgias.  Skin: Negative for rash.  Neurological: Negative for dizziness, weakness and headaches.  Hematological: Does not bruise/bleed easily.  All other systems reviewed and are negative.      Objective:   Physical Exam Vitals and nursing  note reviewed.  Constitutional:      General: She is not in acute distress.    Appearance: Normal appearance. She is well-developed and well-nourished.  HENT:     Head: Normocephalic.     Nose: Nose normal.     Mouth/Throat:     Mouth: Oropharynx is clear and moist.  Eyes:     Extraocular Movements: EOM normal.     Pupils: Pupils are equal, round, and  reactive to light.  Neck:     Vascular: No carotid bruit or JVD.  Cardiovascular:     Rate and Rhythm: Normal rate and regular rhythm.     Pulses: Intact distal pulses.     Heart sounds: Murmur (4/6 systolic) heard.    Pulmonary:     Effort: Pulmonary effort is normal. No respiratory distress.     Breath sounds: Normal breath sounds. No wheezing or rales.  Chest:     Chest wall: No tenderness.  Abdominal:     General: Bowel sounds are normal. There is no distension or abdominal bruit. Aorta is normal.     Palpations: Abdomen is soft. There is no hepatomegaly, splenomegaly, mass or pulsatile mass.     Tenderness: There is no abdominal tenderness.  Musculoskeletal:        General: No edema. Normal range of motion.     Cervical back: Normal range of motion and neck supple.  Lymphadenopathy:     Cervical: No cervical adenopathy.  Skin:    General: Skin is warm and dry.  Neurological:     Mental Status: She is alert and oriented to person, place, and time.     Deep Tendon Reflexes: Reflexes are normal and symmetric.  Psychiatric:        Mood and Affect: Mood and affect normal.        Behavior: Behavior normal.        Thought Content: Thought content normal.        Judgment: Judgment normal.     BP (!) 113/56   Pulse 90   Temp 98.3 F (36.8 C) (Temporal)   Ht '5\' 2"'  (1.575 m)   Wt 110 lb 12.8 oz (50.3 kg)   BMI 20.27 kg/m        Assessment & Plan:  Haley Trevino comes in today with chief complaint of Medical Management of Chronic Issues   Diagnosis and orders addressed:  1. Primary hypertension Low sodium  diet - cloNIDine (CATAPRES) 0.2 MG tablet; Take 1 tablet (0.2 mg total) by mouth 2 (two) times daily.  Dispense: 180 tablet; Refill: 1 - CBC with Differential/Platelet - CMP14+EGFR  2. Hyperlipidemia with target LDL less than 100 Low fat diet - Lipid panel  3. Acquired hypothyroidism Labspending - Thyroid Panel With TSH  4. Gastroesophageal reflux disease without esophagitis Avoid spicy foods Do not eat 2 hours prior to bedtime - omeprazole (PRILOSEC) 20 MG capsule; TAKE 1 TABLET DAILY  Dispense: 90 capsule; Refill: 1  5. [name] without gangrene Keeps hands warm - NIFEdipine (PROCARDIA XL/NIFEDICAL XL) 60 MG 24 hr tablet; Take 1 tablet (60 mg total) by mouth daily.  Dispense: 90 tablet; Refill: 1 - sildenafil (REVATIO) 20 MG tablet; Take 1 tablet (20 mg total) by mouth 2 (two) times daily.  Dispense: 180 tablet; Refill: 1  6. Age-related osteoporosis without current pathological fracture Weight bearing exercises encouraged Continue prolia every 6 months - DG [facility] DEXA  7. Recurrent major depressive disorder, in full remission ([place]) Stress management - FLUoxetine (PROZAC) 40 MG capsule; Take 1 capsule (40 mg total) by mouth daily.  Dispense: 90 capsule; Refill: 3  8. SCLERODERMA Keep follow up with reumatology  9. Anxiety - clonazePAM (KLONOPIN) 0.5 MG tablet; Take 1 tablet (0.5 mg total) by mouth 2 (two) times daily as needed for anxiety.  Dispense: 180 tablet; Refill: 1   10. Leakage of breast implant, subsequent encounter Is going to see specialist next month - HYDROcodone-acetaminophen (  NORCO/VICODIN) 5-325 MG tablet; Take 1 tablet by mouth daily as needed for moderate pain.  Dispense: 60 tablet; Refill: 0   Labs pending Health Maintenance reviewed Diet and exercise encouraged  Follow up plan: 3 months   Mary-Margaret Hassell Done, FNP

## 2020-08-17 LAB — CBC WITH DIFFERENTIAL/PLATELET
Basophils Absolute: 0 10*3/uL (ref 0.0–0.2)
Basos: 1 %
EOS (ABSOLUTE): 0.1 10*3/uL (ref 0.0–0.4)
Eos: 2 %
Hematocrit: 21.7 % — ABNORMAL LOW (ref 34.0–46.6)
Hemoglobin: 8.2 g/dL — CL (ref 11.1–15.9)
Immature Grans (Abs): 0 10*3/uL (ref 0.0–0.1)
Immature Granulocytes: 0 %
Lymphocytes Absolute: 1.5 10*3/uL (ref 0.7–3.1)
Lymphs: 20 %
MCH: 30.6 pg (ref 26.6–33.0)
MCHC: 37.8 g/dL — ABNORMAL HIGH (ref 31.5–35.7)
MCV: 81 fL (ref 79–97)
Monocytes Absolute: 0.6 10*3/uL (ref 0.1–0.9)
Monocytes: 8 %
Neutrophils Absolute: 5.1 10*3/uL (ref 1.4–7.0)
Neutrophils: 69 %
Platelets: 417 10*3/uL (ref 150–450)
RBC: 2.68 x10E6/uL — CL (ref 3.77–5.28)
RDW: 21.3 % — ABNORMAL HIGH (ref 11.7–15.4)
WBC: 7.4 10*3/uL (ref 3.4–10.8)

## 2020-08-17 LAB — THYROID PANEL WITH TSH
Free Thyroxine Index: 2.2 (ref 1.2–4.9)
T3 Uptake Ratio: 24 % (ref 24–39)
T4, Total: 9 ug/dL (ref 4.5–12.0)
TSH: 1.79 u[IU]/mL (ref 0.450–4.500)

## 2020-08-17 LAB — CMP14+EGFR
ALT: 7 IU/L (ref 0–32)
AST: 12 IU/L (ref 0–40)
Albumin/Globulin Ratio: 1.8 (ref 1.2–2.2)
Albumin: 4.2 g/dL (ref 3.7–4.7)
Alkaline Phosphatase: 44 IU/L (ref 44–121)
BUN/Creatinine Ratio: 22 (ref 12–28)
BUN: 21 mg/dL (ref 8–27)
Bilirubin Total: <0.2 mg/dL (ref 0.0–1.2)
CO2: 20 mmol/L (ref 20–29)
Calcium: 9.3 mg/dL (ref 8.7–10.3)
Chloride: 104 mmol/L (ref 96–106)
Creatinine, Ser: 0.97 mg/dL (ref 0.57–1.00)
GFR calc Af Amer: 64 mL/min/{1.73_m2} (ref >59)
GFR calc non Af Amer: 56 mL/min/{1.73_m2} — ABNORMAL LOW (ref >59)
Globulin, Total: 2.3 g/dL (ref 1.5–4.5)
Glucose: 96 mg/dL (ref 65–99)
Potassium: 5.3 mmol/L — ABNORMAL HIGH (ref 3.5–5.2)
Sodium: 139 mmol/L (ref 134–144)
Total Protein: 6.5 g/dL (ref 6.0–8.5)

## 2020-08-17 LAB — LIPID PANEL
Chol/HDL Ratio: 3.2 ratio (ref 0.0–4.4)
Cholesterol, Total: 151 mg/dL (ref 100–199)
HDL: 47 mg/dL (ref >39)
LDL Chol Calc (NIH): 84 mg/dL (ref 0–99)
Triglycerides: 110 mg/dL (ref 0–149)
VLDL Cholesterol Cal: 20 mg/dL (ref 5–40)

## 2020-08-20 ENCOUNTER — Other Ambulatory Visit: Payer: Self-pay | Admitting: Nurse Practitioner

## 2020-08-20 ENCOUNTER — Other Ambulatory Visit: Payer: Self-pay | Admitting: *Deleted

## 2020-08-20 ENCOUNTER — Other Ambulatory Visit: Payer: Self-pay

## 2020-08-20 ENCOUNTER — Other Ambulatory Visit: Payer: Medicare Other

## 2020-08-20 DIAGNOSIS — F419 Anxiety disorder, unspecified: Secondary | ICD-10-CM

## 2020-08-20 DIAGNOSIS — D649 Anemia, unspecified: Secondary | ICD-10-CM

## 2020-08-20 LAB — HEMOGLOBIN, FINGERSTICK: Hemoglobin: 7.9 g/dL — ABNORMAL LOW (ref 11.1–15.9)

## 2020-08-20 MED ORDER — HEMOCYTE PLUS 106-1 MG PO CAPS: 1.0000 | ORAL_CAPSULE | Freq: Every day | ORAL | 1 refills | Status: DC

## 2020-08-24 ENCOUNTER — Other Ambulatory Visit: Payer: Medicare Other

## 2020-08-24 ENCOUNTER — Other Ambulatory Visit: Payer: Self-pay

## 2020-08-24 DIAGNOSIS — D649 Anemia, unspecified: Secondary | ICD-10-CM

## 2020-08-24 LAB — HEMOGLOBIN, FINGERSTICK: Hemoglobin: 8.2 g/dL — ABNORMAL LOW (ref 11.1–15.9)

## 2020-08-28 ENCOUNTER — Telehealth: Payer: Self-pay

## 2020-08-28 ENCOUNTER — Ambulatory Visit (INDEPENDENT_AMBULATORY_CARE_PROVIDER_SITE_OTHER): Payer: Medicare Other | Admitting: Licensed Clinical Social Worker

## 2020-08-28 ENCOUNTER — Ambulatory Visit: Payer: Medicare Other | Admitting: *Deleted

## 2020-08-28 DIAGNOSIS — E785 Hyperlipidemia, unspecified: Secondary | ICD-10-CM | POA: Diagnosis not present

## 2020-08-28 DIAGNOSIS — M810 Age-related osteoporosis without current pathological fracture: Secondary | ICD-10-CM | POA: Diagnosis not present

## 2020-08-28 DIAGNOSIS — M159 Polyosteoarthritis, unspecified: Secondary | ICD-10-CM

## 2020-08-28 DIAGNOSIS — F3342 Major depressive disorder, recurrent, in full remission: Secondary | ICD-10-CM

## 2020-08-28 DIAGNOSIS — K219 Gastro-esophageal reflux disease without esophagitis: Secondary | ICD-10-CM

## 2020-08-28 DIAGNOSIS — I10 Essential (primary) hypertension: Secondary | ICD-10-CM | POA: Diagnosis not present

## 2020-08-28 DIAGNOSIS — D509 Iron deficiency anemia, unspecified: Secondary | ICD-10-CM

## 2020-08-28 DIAGNOSIS — E039 Hypothyroidism, unspecified: Secondary | ICD-10-CM | POA: Diagnosis not present

## 2020-08-28 DIAGNOSIS — I7300 Raynaud's syndrome without gangrene: Secondary | ICD-10-CM

## 2020-08-28 DIAGNOSIS — D500 Iron deficiency anemia secondary to blood loss (chronic): Secondary | ICD-10-CM | POA: Insufficient documentation

## 2020-08-28 NOTE — Chronic Care Management (AMB) (Signed)
Chronic Care Management    Clinical Social Work Follow Up Note  08/28/2020 Name: Haley Trevino MRN: [id number] DOB: 12-May-1941  Haley Trevino is a 80 y.o. year old female who is a primary care patient of Haley Trevino, [place]. The CCM team was consulted for assistance with [name] .   Review of patient status, including review of consultants reports, other relevant assessments, and collaboration with appropriate care team members and the patient's provider was performed as part of comprehensive patient evaluation and provision of chronic care management services.    SDOH (Social Determinants of Health) assessments performed: No; risk for depression; risk for tobacco use; risk for stress; risk for physical inactivity  Haley Trevino Visit from 05/14/2020 in [place]  PHQ-9 Total Score 0      GAD 7 : Generalized Anxiety Score 02/20/2020 10/24/2019 04/14/2019  Nervous, Anxious, on Edge 3 3 2   Control/stop worrying 0 1 1  Worry too much - different things 0 1 1  Trouble relaxing 3 0 1  Restless 0 0 0  Easily annoyed or irritable 0 0 0  Afraid - awful might happen 1 0 1  Total GAD 7 Score 7 5 6   Anxiety Difficulty Not difficult at all Somewhat difficult -    Outpatient Encounter Medications as of 08/28/2020  Medication Sig  . acetaminophen (TYLENOL) 500 MG tablet Take 1,000 mg by mouth every 6 (six) hours as needed for moderate pain or headache.  . Cholecalciferol (VITAMIN D3) 2000 units TABS Take 2,000 Units by mouth daily.   . clonazePAM (KLONOPIN) 0.5 MG tablet Take 1 tablet (0.5 mg total) by mouth 2 (two) times daily as needed for anxiety.  . cloNIDine (CATAPRES) 0.2 MG tablet Take 1 tablet (0.2 mg total) by mouth 2 (two) times daily.  Marland Kitchen ezetimibe (ZETIA) 10 MG tablet Take 1 tablet (10 mg total) by mouth daily.  . Fe Fum-FA-B Cmp-C-Zn-Mg-Mn-Cu (HEMOCYTE PLUS) 106-1 MG CAPS Take 1 capsule by mouth daily.  . ferrous sulfate 325  (65 FE) MG tablet Take 1 tablet (325 mg total) by mouth 2 (two) times daily with a meal.  . FLUoxetine (PROZAC) 40 MG capsule Take 1 capsule (40 mg total) by mouth daily.  Marland Kitchen HYDROcodone-acetaminophen (NORCO/VICODIN) 5-325 MG tablet Take 1 tablet by mouth daily as needed for moderate pain.  Marland Kitchen lisinopril (ZESTRIL) 40 MG tablet Take 1 tablet (40 mg total) by mouth daily.  Marland Kitchen NIFEdipine (PROCARDIA XL/NIFEDICAL XL) 60 MG 24 hr tablet Take 1 tablet (60 mg total) by mouth daily.  Marland Kitchen omeprazole (PRILOSEC) 20 MG capsule TAKE 1 TABLET DAILY  . Probiotic Product ([place]) CAPS Take 1 capsule by mouth daily.   Marland Kitchen PROLIA 60 MG/ML SOSY injection Inject 60 mg into the skin every 6 (six) months.  . sildenafil (REVATIO) 20 MG tablet Take 1 tablet (20 mg total) by mouth 2 (two) times daily.  Marland Kitchen SYNTHROID 75 MCG tablet Take 1 tablet (75 mcg total) by mouth daily before breakfast.   No facility-administered encounter medications on file as of 08/28/2020.    Goals    .  Client will talk with LCSW in next 30 days to discuss stress issues of client and managing stress issues faced (pt-stated)      CARE PLAN ENTRY   Current Barriers:  . Pain barriers in client with chronic diagnoses of OA, Hypothyroidism, Depression, GERD, HTN, HLD, Osteoporosis, [name] . Anemia  . Fatigue . Potential for falls  Clinical Social Work Clinical Goal(s):  Marland Kitchen LCSW will call client in next 30 days to talk with client about managing stress issues of client  Interventions:  Talked with client about anemia concerns  Talked with client about upcoming client appointment on September 04, 2020 . Talked with client about sleeping issues of client Talked with client about CCM program support Encouraged client to talk with RNCM to discuss nursing needs of client Talked with client about stress issues of client Provided counseling support for client Talked with client about pain issues of client Talked with client about  health issues of her spouse Talked with client about relaxation techniques (enjoys crafts, reading,cares for pets) Talked with client about her decreased appetite Talked with client about ambulation of client Talked with client about family support Talked with client about her occasional shortness of breath Talked with client about dexterity of her hands Talked with client about transport needs of client Collaborated with RNCM regarding nursing needs of client Talked with client about decreased energy of client   Patient Self Care Activities:  Does daily ADLS independently Takes medications as prescribed Attends medical appointments  Patient Self Care Deficits . Anemia . Weakness, fatigue . Fall potential .   Initial goal documentation     Follow Up Plan: LCSW to call client in next 4 weeks to talk with her about stress issues faced by client and to talk about client strategies for managing stress issues faced  Norva Riffle.Daryus Sowash MSW, LCSW Licensed Clinical Social Worker [facility] Care Management [phone]

## 2020-08-28 NOTE — Patient Instructions (Addendum)
Licensed Clinical Social Worker Visit Information  Goals we discussed today:  .  Client will talk with LCSW in next 30 days to discuss stress issues of client and managing stress issues faced (pt-stated)        CARE PLAN ENTRY   Current Barriers:   Pain barriers in client with chronic diagnoses of OA, Hypothyroidism, Depression, GERD, HTN, HLD, Osteoporosis, [name]  Anemia   Fatigue  Potential for falls  Clinical Social Work Clinical Goal(s):   LCSW will call client in next 30 days to talk with client about managing stress issues of client  Interventions:  Talked with client about anemia concerns  Talked with client about upcoming client appointment on September 04, 2020 . Talked with client about sleeping issues of client Talked with client about CCM program support Encouraged client to talk with RNCM to discuss nursing needs of client Talked with client about stress issues of client Provided counseling support for client Talked with client about pain issues of client Talked with client about health issues of her spouse Talked with client about relaxation techniques (enjoys crafts, reading,cares for pets) Talked with client about her decreased appetite Talked with client about ambulation of client Talked with client about family support Talked with client about her occasional shortness of breath Talked with client about dexterity of her hands Talked with client about transport needs of client Collaborated with RNCM regarding nursing needs of client Talked with client about decreased energy of client   Patient Self Care Activities:  Does daily ADLS independently Takes medications as prescribed Attends medical appointments  Patient Self Care Deficits  Anemia  Weakness, fatigue  Fall potential    Initial goal documentation     Follow Up Plan: LCSW to call client in next 4 weeks to talk with her about stress issues faced by client and to  talk about client strategies for managing stress issues faced  Materials Provided: No  The patient verbalized understanding of instructions provided today and declined a print copy of patient instruction materials.   Norva Riffle.Oronde Hallenbeck MSW, LCSW Licensed Clinical Social Worker [facility] Care Management [phone]

## 2020-08-28 NOTE — Chronic Care Management (AMB) (Signed)
  Chronic Care Management   Care Coordination Note  08/28/2020 Name: Haley Trevino MRN: [id number] DOB: 08/28/1940  Consulted by Theadore Nan, LCSW after his telephone visit with Haley Trevino today. Chart was reviewed. She has a long history of iron deficiency anemia. Last Hgb was 4 days ago and showed improvement from previous. She has a long history of associated fatigue, weakness, decreased appetite, and episodes of SOB. She does take daily iron supplement. She is being followed by [name], FNP for this and patient mentioned that she may personally reach out to GI to discuss possible iron infusions. She does have an appointment with plastic surgeon on 09/04/20 to discuss possible breast implant illness.   Lab Results  Component Value Date   HGB 8.2 (LL) 08/16/2020   Lab Results  Component Value Date   FERRITIN 5 (L) 05/14/2020    Follow up plan: Telephone follow up appointment with care management team member scheduled for: [facility] 09/07/20 The patient has been provided with contact information for the care management team and has been advised to call with any health related questions or concerns.  Next PCP appointment scheduled for: 11/12/20 with [name], FNP  Chong Sicilian, BSN, RN-BC [place] [facility] [place] Management Direct Dial: [phone]

## 2020-08-28 NOTE — Patient Instructions (Signed)
Follow up plan: Telephone follow up appointment with care management team member scheduled for: [facility] 09/07/20 The patient has been provided with contact information for the care management team and has been advised to call with any health related questions or concerns.  Next PCP appointment scheduled for: 11/12/20 with [name], FNP  Chong Sicilian, BSN, RN-BC [place] [facility] [place] Management Direct Dial: [phone]

## 2020-08-28 NOTE — Telephone Encounter (Signed)
Patient's last Prolia shot was July 2021.  Please advise of status.

## 2020-09-04 ENCOUNTER — Ambulatory Visit (INDEPENDENT_AMBULATORY_CARE_PROVIDER_SITE_OTHER): Payer: Medicare Other | Admitting: Plastic Surgery

## 2020-09-04 ENCOUNTER — Encounter: Payer: Self-pay | Admitting: Plastic Surgery

## 2020-09-04 ENCOUNTER — Other Ambulatory Visit: Payer: Self-pay

## 2020-09-04 VITALS — BP 154/55 | HR 64 | Ht 62.0 in | Wt 109.2 lb

## 2020-09-04 DIAGNOSIS — M2550 Pain in unspecified joint: Secondary | ICD-10-CM | POA: Diagnosis not present

## 2020-09-04 DIAGNOSIS — M810 Age-related osteoporosis without current pathological fracture: Secondary | ICD-10-CM | POA: Diagnosis not present

## 2020-09-04 DIAGNOSIS — T8543XA Leakage of breast prosthesis and implant, initial encounter: Secondary | ICD-10-CM | POA: Diagnosis not present

## 2020-09-04 DIAGNOSIS — M349 Systemic sclerosis, unspecified: Secondary | ICD-10-CM

## 2020-09-04 DIAGNOSIS — I7300 Raynaud's syndrome without gangrene: Secondary | ICD-10-CM | POA: Diagnosis not present

## 2020-09-04 NOTE — Progress Notes (Signed)
   Subjective:    Patient ID: Haley Trevino, female    DOB: [birth year], 80 y.o.   MRN: [id number]  The patient is a 80 year old female here for follow-up after undergoing removal of bilateral ruptured implants and complete capsulectomies.  The patient brought in paperwork with checkmark stating she feels like she has implant sickness.  She has a variety of symptoms.  These symptoms include fatigue and soreness weakness.  Physical exam her scars are healing very nicely.  There is no sign of seroma or hematoma.  There is no breast pain.  She has a history of osteoarthritis, rheumatoid disease, scleroderma and thyroid disease.  She has had surgical intervention for pain in the past.  She has had sympathectomies on her hands as well for the [name].   Review of Systems  Constitutional: Positive for activity change. Negative for appetite change.  Respiratory: Negative for chest tightness and shortness of breath.   Cardiovascular: Negative for leg swelling.  Gastrointestinal: Negative for abdominal distention.  Endocrine: Negative.   Genitourinary: Negative.   Musculoskeletal: Positive for back pain.       Objective:   Physical Exam Vitals and nursing note reviewed.  Constitutional:      Appearance: Normal appearance.  HENT:     Head: Normocephalic.  Cardiovascular:     Rate and Rhythm: Normal rate.     Pulses: Normal pulses.  Pulmonary:     Effort: Pulmonary effort is normal. No respiratory distress.  Abdominal:     General: Abdomen is flat. There is no distension.     Tenderness: There is no abdominal tenderness.  Skin:    General: Skin is warm.     Capillary Refill: Capillary refill takes 2 to 3 seconds.  Neurological:     General: No focal deficit present.     Mental Status: She is alert and oriented to person, place, and time.  Psychiatric:        Mood and Affect: Mood normal.        Behavior: Behavior normal.         Assessment & Plan:     ICD-10-CM    1. Age-related osteoporosis without current pathological fracture  M81.0   2. SCLERODERMA  M34.9   3. Arthralgia, unspecified joint  M25.50   4. [name] without gangrene  I73.00   5. Breast implant rupture, initial encounter  T85.43XA     Recommend the patient follow-up with her primary care physician.  She may benefit from a pain management consultation or a consultation with the pain management physician at the neurosurgery office who may be able to do injections that would help relieve her pain.  I wrote this down for the patient and she agreed to talk to her primary care physician.  I tried to encourage the patient that now that the implants are removed any symptoms that may have been related to the implants should resolve.  I cannot say that her current symptoms are implant sickness.  Follow-up with [place] as needed.  Pictures were obtained of the patient and placed in the chart with the patient's or guardian's permission.

## 2020-09-07 ENCOUNTER — Telehealth: Payer: Self-pay | Admitting: Nurse Practitioner

## 2020-09-07 ENCOUNTER — Ambulatory Visit (INDEPENDENT_AMBULATORY_CARE_PROVIDER_SITE_OTHER): Payer: Medicare Other | Admitting: *Deleted

## 2020-09-07 DIAGNOSIS — M159 Polyosteoarthritis, unspecified: Secondary | ICD-10-CM

## 2020-09-07 DIAGNOSIS — D509 Iron deficiency anemia, unspecified: Secondary | ICD-10-CM

## 2020-09-07 DIAGNOSIS — I10 Essential (primary) hypertension: Secondary | ICD-10-CM

## 2020-09-07 MED ORDER — PROLIA 60 MG/ML ~~LOC~~ SOSY
60.0000 mg | PREFILLED_SYRINGE | SUBCUTANEOUS | 0 refills | Status: DC
Start: 2020-09-07 — End: 2021-10-16

## 2020-09-07 NOTE — Chronic Care Management (AMB) (Signed)
Chronic Care Management   CCM RN Visit Note  09/07/2020 Name: Haley Trevino MRN: [id number] DOB: March 26, 1941  Subjective: Haley Trevino is a 80 y.o. year old female who is a primary care patient of [name], [place]. The care management team was consulted for assistance with disease management and care coordination needs.    Engaged with patient by telephone for follow up visit in response to provider referral for case management and/or care coordination services.   Consent to Services:  The patient was given information about Chronic Care Management services, agreed to services, and gave verbal consent prior to initiation of services.  Please see initial visit note for detailed documentation.   Patient agreed to services and verbal consent obtained.   Assessment: Review of patient past medical history, allergies, medications, health status, including review of consultants reports, laboratory and other test data, was performed as part of comprehensive evaluation and provision of chronic care management services.   SDOH (Social Determinants of Health) assessments and interventions performed:    CCM Care Plan  Allergies  Allergen Reactions  . Dilaudid [Hydromorphone Hcl] Anaphylaxis  . Acyclovir And Related     Unknown reaction  . Crestor [Rosuvastatin Calcium] Other (See Comments)    Muscle pain  . Gabapentin Swelling    Swelling in feet, and in legs  . Aleve [Naproxen Sodium] Rash  . Lyrica [Pregabalin] Other (See Comments)    Swelling in feet, and in legs    Outpatient Encounter Medications as of 09/07/2020  Medication Sig  . acetaminophen (TYLENOL) 500 MG tablet Take 1,000 mg by mouth every 6 (six) hours as needed for moderate pain or headache.  . Cholecalciferol (VITAMIN D3) 2000 units TABS Take 2,000 Units by mouth daily.   . clonazePAM (KLONOPIN) 0.5 MG tablet Take 1 tablet (0.5 mg total) by mouth 2 (two) times daily as needed for anxiety.  . cloNIDine  (CATAPRES) 0.2 MG tablet Take 1 tablet (0.2 mg total) by mouth 2 (two) times daily.  Marland Kitchen ezetimibe (ZETIA) 10 MG tablet Take 1 tablet (10 mg total) by mouth daily.  . Fe Fum-FA-B Cmp-C-Zn-Mg-Mn-Cu (HEMOCYTE PLUS) 106-1 MG CAPS Take 1 capsule by mouth daily.  . ferrous sulfate 325 (65 FE) MG tablet Take 1 tablet (325 mg total) by mouth 2 (two) times daily with a meal.  . FLUoxetine (PROZAC) 40 MG capsule Take 1 capsule (40 mg total) by mouth daily.  Marland Kitchen HYDROcodone-acetaminophen (NORCO/VICODIN) 5-325 MG tablet Take 1 tablet by mouth daily as needed for moderate pain.  Marland Kitchen lisinopril (ZESTRIL) 40 MG tablet Take 1 tablet (40 mg total) by mouth daily.  Marland Kitchen NIFEdipine (PROCARDIA XL/NIFEDICAL XL) 60 MG 24 hr tablet Take 1 tablet (60 mg total) by mouth daily.  Marland Kitchen omeprazole (PRILOSEC) 20 MG capsule TAKE 1 TABLET DAILY  . Probiotic Product ([place]) CAPS Take 1 capsule by mouth daily.   Marland Kitchen PROLIA 60 MG/ML SOSY injection Inject 60 mg into the skin every 6 (six) months.  . sildenafil (REVATIO) 20 MG tablet Take 1 tablet (20 mg total) by mouth 2 (two) times daily.  Marland Kitchen SYNTHROID 75 MCG tablet Take 1 tablet (75 mcg total) by mouth daily before breakfast.   No facility-administered encounter medications on file as of 09/07/2020.    Patient Active Problem List   Diagnosis Date Noted  . Breast implant rupture 09/04/2020  . Iron deficiency anemia due to chronic blood loss 08/28/2020  . Arthralgia 07/16/2020  . Pain in right shoulder 04/11/2020  .  Murmur 11/18/2017  . Impingement syndrome of left shoulder region 12/08/2014  . Osteoporosis 09/20/2014  . Hip fracture requiring operative repair ([place]) 02/22/2014  . Hypothyroidism 11/29/2013  . Depression 11/29/2013  . GERD (gastroesophageal reflux disease) 11/29/2013  . Hypertension 11/29/2013  . Hyperlipidemia with target LDL less than 100 11/29/2013  . Thyroid cyst 04/26/2013  . MALIGNANT MELANOMA, SKIN 08/07/2010  . [name] 08/06/2010  .  SCLERODERMA 08/06/2010  . Osteoarthritis 08/06/2010    Conditions to be addressed/monitored:HTN and anemia, scleroderma, chronic pain  Care Plan : RNCM: Chronic Pain (Adult)  Updates made by Ilean China, RN since 09/07/2020 12:00 AM    Problem: Pain Management Plan (Chronic Pain)   Priority: Medium    Goal: Pain Management Plan Developed   Start Date: 09/07/2020  This Visit's Progress: Not on track  Priority: Medium  Note:   Current Barriers:  . Care Coordination needs related to chronic pain in a patient with hypertension, arthritis, and scleroderma . Pain has unknown cause . Sufficient dose of pain medication makes her sick  Nurse Case Manager Clinical Goal(s):  . patient will work with PCP to address needs related to pain management . Patient will reach out to [facility] with any care coordination needs  Interventions:  . 1:1 collaboration with [name], FNP regarding development and update of comprehensive plan of care as evidenced by provider attestation and co-signature . Inter-disciplinary care team collaboration (see longitudinal plan of care) . Chart reviewed including relevant office notes and lab results . Reviewed medications o Takes 1/2 dose of pain medication at night to help ease her pain and allow her to sleep.  - Can't tolerate a whole dose. It makes her sick. . Discussed pain with patient o Upper back pain, chest pain, and pain under arms o Cardiac cause has been ruled out o Plastic surgeon didn't feel that it was related to history of silicone breast implant rupture o Hx of vertebroplasty in thoracic spine . Discussed upcoming appointment o Patient is scheduled with PCP on 3/11 to discuss pain management plan . Encouraged patient to reach out to [facility] as needed. She does not have any care coordination or nursing care needs at this time.  . Recommended to continue talking with LCSW regarding psychosocial issues  Patient  Goals/Self-Care Activities Over the next 30 days, patient will: . Talk with PCP about plan for pain management or plan for determining source of pain . Call RN Care Manager if needed [phone]    Care Plan : RNCM: Anemia  Updates made by Ilean China, RN since 09/07/2020 12:00 AM    Problem: Iron Deficiency Anemia   Priority: Medium    Long-Range Goal: Resolve Anemia   Start Date: 09/07/2020  This Visit's Progress: Not on track  Priority: Medium  Note:   Current Barriers:  . Chronic Disease Management support and education needs related to anemia in a patient with hypertension and scleroderma.  . Cause of anemia unknown  Nurse Case Manager Clinical Goal(s):  . patient will work with PCP to address needs related to anemia  Interventions:  . 1:1 collaboration with [name], FNP regarding development and update of comprehensive plan of care as evidenced by provider attestation and co-signature . Inter-disciplinary care team collaboration (see longitudinal plan of care) . Evaluation of current treatment plan related to anemia and patient's adherence to plan as established by provider. . Chart reviewed including relevant office notes and lab results o Discussed  recent Hgb results with patient and last ferritin level . Reviewed and discussed medications o Started taking Hemocyte Plus again recently . Discussed prior workup for anemia o GI didn't find a cause for blood loss o Recommended considering iron infusions . Discussed family history o Aunt had to have iron infusions . Discussed upcoming appointments o PCP on 09/14/20. Patient is going to talk with her about rechecking Hgb and about anemia treatment plan.  . Encouraged patient to reach out to [facility] team as needed. Does not feel she needs Care Coordination or nursing support at this time.   Patient Goals/Self-Care Activities Over the next 30 days, patient will: . Talk with PCP about anemia testing and  management . Take hemocyte as instructed . Call PCP with any new or worsening symptoms . Call RN Care Manager if needed [phone]     Follow Up Plan:  . Telephone follow up appointment with care management team member scheduled for: 09/27/20 with LCSW . The patient has been provided with contact information for the care management team and has been advised to call with any health related questions or concerns.  . Next PCP appointment scheduled for: 09/14/20 with [name], FNP  Chong Sicilian, BSN, RN-BC [place] [facility] Management Direct Dial: [phone]

## 2020-09-07 NOTE — Patient Instructions (Signed)
Visit Information  PATIENT GOALS: Goals Addressed            This Visit's Progress   . Develop Pain Management Plan       Timeframe:  Short-Term Goal Priority:  Medium Start Date:                             Expected End Date:                       Follow-up: PCP on 09/14/20  . Talk with PCP about plan for pain management or plan for determining source of pain . Call RN Care Manager if needed [phone]    . Resolve Anemia       Timeframe:  Long-Range Goal Priority:  Medium Start Date:                             Expected End Date:                       Follow-up: PCP on 09/14/20  . Talk with PCP about anemia testing and management . Take hemocyte as instructed . Call PCP with any new or worsening symptoms . Call RN Care Manager if needed [phone]       Patient verbalizes understanding of instructions provided today and agrees to view in [place].    Follow Up Plan:  . Telephone follow up appointment with care management team member scheduled for: 09/27/20 with LCSW . The patient has been provided with contact information for the care management team and has been advised to call with any health related questions or concerns.  . Next PCP appointment scheduled for: 09/14/20 with [name], FNP  Chong Sicilian, BSN, RN-BC [place] [facility] [place] Management Direct Dial: [phone]

## 2020-09-07 NOTE — Telephone Encounter (Signed)
RX sent for patient.

## 2020-09-07 NOTE — Telephone Encounter (Signed)
Pt needs prolia shot ordered to [facility]

## 2020-09-10 NOTE — Telephone Encounter (Signed)
Pt has already spoken with [facility] and will pick it up and bring it in to her appt Friday to be administered.

## 2020-09-14 ENCOUNTER — Encounter: Payer: Self-pay | Admitting: Nurse Practitioner

## 2020-09-14 ENCOUNTER — Other Ambulatory Visit: Payer: Self-pay

## 2020-09-14 ENCOUNTER — Ambulatory Visit (INDEPENDENT_AMBULATORY_CARE_PROVIDER_SITE_OTHER): Payer: Medicare Other | Admitting: Nurse Practitioner

## 2020-09-14 VITALS — BP 104/53 | HR 67 | Temp 99.3°F | Resp 20 | Ht 62.0 in | Wt 109.0 lb

## 2020-09-14 DIAGNOSIS — D509 Iron deficiency anemia, unspecified: Secondary | ICD-10-CM

## 2020-09-14 DIAGNOSIS — I10 Essential (primary) hypertension: Secondary | ICD-10-CM | POA: Diagnosis not present

## 2020-09-14 LAB — HEMOGLOBIN, FINGERSTICK: Hemoglobin: 7.8 g/dL — ABNORMAL LOW (ref 11.1–15.9)

## 2020-09-14 NOTE — Progress Notes (Signed)
   Subjective:    Patient ID: Haley Trevino, female    DOB: 02-19-1941, 80 y.o.   MRN: [id number]   Chief Complaint: Recheck hemoglobin   HPI Patient comes in today for recheck of her hgb. She was seen for routine follow up on 08/16/20 and her hgb was 8.2. SHe had already seen Dr. Watt Climes and he could not find any reason for her low hgb. She was encourage dt take daily iron supplement and come back in for recheck. She says her stool is dark from taking iron supplements.  * her aunt had to have frequent iron infusions at her age. Review of Systems  Constitutional: Negative.   Respiratory: Negative.   Cardiovascular: Negative.   Genitourinary: Negative.   Neurological: Negative.   Psychiatric/Behavioral: Negative.   All other systems reviewed and are negative.      Objective:   Physical Exam Vitals and nursing note reviewed.  Constitutional:      Appearance: Normal appearance.  Cardiovascular:     Rate and Rhythm: Normal rate and regular rhythm.     Heart sounds: Normal heart sounds.  Pulmonary:     Effort: Pulmonary effort is normal.     Breath sounds: Normal breath sounds.  Skin:    Coloration: Skin is pale.  Neurological:     General: No focal deficit present.     Mental Status: She is alert.  Psychiatric:        Mood and Affect: Mood normal.        Behavior: Behavior normal.    BP (!) 104/53   Pulse 67   Temp 99.3 F (37.4 C) (Temporal)   Resp 20   Ht 5\' 2"  (1.575 m)   Wt 109 lb (49.4 kg)   BMI 19.94 kg/m    hgb 7.8     Assessment & Plan:  Haley Trevino in today with chief complaint of Recheck hemoglobin   1. Iron deficiency anemia, unspecified iron deficiency anemia type recheck Monday if no appointment with hematology by then - Hemoglobin, fingerstick - Ambulatory referral to Hematology  2. Primary hypertension Hold clonidine today    The above assessment and management plan was discussed with the patient. The patient verbalized  understanding of and has agreed to the management plan. Patient is aware to call the clinic if symptoms persist or worsen. Patient is aware when to return to the clinic for a follow-up visit. Patient educated on when it is appropriate to go to the emergency department.   Mary-Margaret Hassell Done, FNP

## 2020-09-14 NOTE — Patient Instructions (Signed)
[employer] medicine (25th ed., pp. [phone]). [place], [place]: Elsevier.">  Anemia  Anemia is a condition in which there is not enough red blood cells or hemoglobin in the blood. Hemoglobin is a substance in red blood cells that carries oxygen. When you do not have enough red blood cells or hemoglobin (are anemic), your body cannot get enough oxygen and your organs may not work properly. As a result, you may feel very tired or have other problems. What are the causes? Common causes of anemia include:  Excessive bleeding. Anemia can be caused by excessive bleeding inside or outside the body, including bleeding from the intestines or from heavy menstrual periods in females.  Poor nutrition.  Long-lasting (chronic) kidney, thyroid, and liver disease.  Bone marrow disorders, spleen problems, and blood disorders.  Cancer and treatments for cancer.  HIV (human immunodeficiency virus) and AIDS (acquired immunodeficiency syndrome).  Infections, medicines, and autoimmune disorders that destroy red blood cells. What are the signs or symptoms? Symptoms of this condition include:  Minor weakness.  Dizziness.  Headache, or difficulties concentrating and sleeping.  Heartbeats that feel irregular or faster than normal (palpitations).  Shortness of breath, especially with exercise.  Pale skin, lips, and nails, or cold hands and feet.  Indigestion and nausea. Symptoms may occur suddenly or develop slowly. If your anemia is mild, you may not have symptoms. How is this diagnosed? This condition is diagnosed based on blood tests, your medical history, and a physical exam. In some cases, a test may be needed in which cells are removed from the soft tissue inside of a bone and looked at under a microscope (bone marrow biopsy). Your health care provider may also check your stool (feces) for blood and may do additional testing to look for the cause of your bleeding. Other tests may  include:  Imaging tests, such as a CT scan or MRI.  A procedure to see inside your esophagus and stomach (endoscopy).  A procedure to see inside your colon and rectum (colonoscopy). How is this treated? Treatment for this condition depends on the cause. If you continue to lose a lot of blood, you may need to be treated at a hospital. Treatment may include:  Taking supplements of iron, vitamin Q68, or folic acid.  Taking a hormone medicine (erythropoietin) that can help to stimulate red blood cell growth.  Having a blood transfusion. This may be needed if you lose a lot of blood.  Making changes to your diet.  Having surgery to remove your spleen. Follow these instructions at home:  Take over-the-counter and prescription medicines only as told by your health care provider.  Take supplements only as told by your health care provider.  Follow any diet instructions that you were given by your health care provider.  Keep all follow-up visits as told by your health care provider. This is important. Contact a health care provider if:  You develop new bleeding anywhere in the body. Get help right away if:  You are very weak.  You are short of breath.  You have pain in your abdomen or chest.  You are dizzy or feel faint.  You have trouble concentrating.  You have bloody stools, black stools, or tarry stools.  You vomit repeatedly or you vomit up blood. These symptoms may represent a serious problem that is an emergency. Do not wait to see if the symptoms will go away. Get medical help right away. Call your local emergency services (911 in the [place] Do not  drive yourself to the hospital. Summary  Anemia is a condition in which you do not have enough red blood cells or enough of a substance in your red blood cells that carries oxygen (hemoglobin).  Symptoms may occur suddenly or develop slowly.  If your anemia is mild, you may not have symptoms.  This condition is  diagnosed with blood tests, a medical history, and a physical exam. Other tests may be needed.  Treatment for this condition depends on the cause of the anemia. This information is not intended to replace advice given to you by your health care provider. Make sure you discuss any questions you have with your health care provider. Document Revised: 05/31/2019 Document Reviewed: 05/31/2019 Elsevier Patient Education  2021 [employer].  

## 2020-09-17 ENCOUNTER — Other Ambulatory Visit: Payer: Self-pay

## 2020-09-17 ENCOUNTER — Telehealth: Payer: Self-pay | Admitting: Nurse Practitioner

## 2020-09-17 ENCOUNTER — Other Ambulatory Visit: Payer: Medicare Other

## 2020-09-17 DIAGNOSIS — D509 Iron deficiency anemia, unspecified: Secondary | ICD-10-CM | POA: Diagnosis not present

## 2020-09-17 LAB — HEMOGLOBIN, FINGERSTICK: Hemoglobin: 7.4 g/dL — ABNORMAL LOW (ref 11.1–15.9)

## 2020-09-19 ENCOUNTER — Other Ambulatory Visit: Payer: Self-pay

## 2020-09-19 ENCOUNTER — Other Ambulatory Visit: Payer: Medicare Other

## 2020-09-19 DIAGNOSIS — D509 Iron deficiency anemia, unspecified: Secondary | ICD-10-CM

## 2020-09-19 LAB — HEMOGLOBIN, FINGERSTICK: Hemoglobin: 9.6 g/dL — ABNORMAL LOW (ref 11.1–15.9)

## 2020-09-20 ENCOUNTER — Other Ambulatory Visit: Payer: Self-pay | Admitting: Nurse Practitioner

## 2020-09-20 DIAGNOSIS — I10 Essential (primary) hypertension: Secondary | ICD-10-CM

## 2020-09-21 ENCOUNTER — Inpatient Hospital Stay (HOSPITAL_COMMUNITY): Payer: Medicare Other | Attending: Hematology and Oncology | Admitting: Hematology and Oncology

## 2020-09-21 ENCOUNTER — Inpatient Hospital Stay (HOSPITAL_COMMUNITY): Payer: Medicare Other

## 2020-09-21 ENCOUNTER — Encounter (HOSPITAL_COMMUNITY): Payer: Self-pay

## 2020-09-21 ENCOUNTER — Encounter (HOSPITAL_COMMUNITY): Payer: Self-pay | Admitting: Hematology and Oncology

## 2020-09-21 ENCOUNTER — Other Ambulatory Visit: Payer: Self-pay

## 2020-09-21 VITALS — BP 130/72 | HR 77 | Temp 98.2°F | Resp 17 | Wt 106.1 lb

## 2020-09-21 DIAGNOSIS — M349 Systemic sclerosis, unspecified: Secondary | ICD-10-CM | POA: Diagnosis not present

## 2020-09-21 DIAGNOSIS — I7300 Raynaud's syndrome without gangrene: Secondary | ICD-10-CM | POA: Diagnosis not present

## 2020-09-21 DIAGNOSIS — K529 Noninfective gastroenteritis and colitis, unspecified: Secondary | ICD-10-CM | POA: Insufficient documentation

## 2020-09-21 DIAGNOSIS — E039 Hypothyroidism, unspecified: Secondary | ICD-10-CM

## 2020-09-21 DIAGNOSIS — K589 Irritable bowel syndrome without diarrhea: Secondary | ICD-10-CM | POA: Insufficient documentation

## 2020-09-21 DIAGNOSIS — D649 Anemia, unspecified: Secondary | ICD-10-CM

## 2020-09-21 DIAGNOSIS — D509 Iron deficiency anemia, unspecified: Secondary | ICD-10-CM | POA: Insufficient documentation

## 2020-09-21 DIAGNOSIS — A09 Infectious gastroenteritis and colitis, unspecified: Secondary | ICD-10-CM | POA: Insufficient documentation

## 2020-09-21 DIAGNOSIS — Z86010 Personal history of colonic polyps: Secondary | ICD-10-CM | POA: Insufficient documentation

## 2020-09-21 DIAGNOSIS — D500 Iron deficiency anemia secondary to blood loss (chronic): Secondary | ICD-10-CM

## 2020-09-21 DIAGNOSIS — R1311 Dysphagia, oral phase: Secondary | ICD-10-CM | POA: Insufficient documentation

## 2020-09-21 DIAGNOSIS — R634 Abnormal weight loss: Secondary | ICD-10-CM | POA: Insufficient documentation

## 2020-09-21 LAB — COMPREHENSIVE METABOLIC PANEL
ALT: 15 U/L (ref 0–44)
AST: 20 U/L (ref 15–41)
Albumin: 4 g/dL (ref 3.5–5.0)
Alkaline Phosphatase: 34 U/L — ABNORMAL LOW (ref 38–126)
Anion gap: 10 (ref 5–15)
BUN: 19 mg/dL (ref 8–23)
CO2: 22 mmol/L (ref 22–32)
Calcium: 9.2 mg/dL (ref 8.9–10.3)
Chloride: 105 mmol/L (ref 98–111)
Creatinine, Ser: 1 mg/dL (ref 0.44–1.00)
GFR, Estimated: 57 mL/min — ABNORMAL LOW (ref >60)
Glucose, Bld: 122 mg/dL — ABNORMAL HIGH (ref 70–99)
Potassium: 4 mmol/L (ref 3.5–5.1)
Sodium: 137 mmol/L (ref 135–145)
Total Bilirubin: 0.2 mg/dL — ABNORMAL LOW (ref 0.3–1.2)
Total Protein: 7.5 g/dL (ref 6.5–8.1)

## 2020-09-21 LAB — CBC WITH DIFFERENTIAL/PLATELET
Abs Immature Granulocytes: 0.02 10*3/uL (ref 0.00–0.07)
Basophils Absolute: 0 10*3/uL (ref 0.0–0.1)
Basophils Relative: 1 %
Eosinophils Absolute: 0.1 10*3/uL (ref 0.0–0.5)
Eosinophils Relative: 1 %
HCT: 33.3 % — ABNORMAL LOW (ref 36.0–46.0)
Hemoglobin: 10.2 g/dL — ABNORMAL LOW (ref 12.0–15.0)
Immature Granulocytes: 0 %
Lymphocytes Relative: 24 %
Lymphs Abs: 1.7 10*3/uL (ref 0.7–4.0)
MCH: 25.4 pg — ABNORMAL LOW (ref 26.0–34.0)
MCHC: 30.6 g/dL (ref 30.0–36.0)
MCV: 83 fL (ref 80.0–100.0)
Monocytes Absolute: 0.4 10*3/uL (ref 0.1–1.0)
Monocytes Relative: 6 %
Neutro Abs: 4.8 10*3/uL (ref 1.7–7.7)
Neutrophils Relative %: 68 %
Platelets: 428 10*3/uL — ABNORMAL HIGH (ref 150–400)
RBC: 4.01 MIL/uL (ref 3.87–5.11)
RDW: 21.3 % — ABNORMAL HIGH (ref 11.5–15.5)
WBC: 7 10*3/uL (ref 4.0–10.5)
nRBC: 0 % (ref 0.0–0.2)

## 2020-09-21 LAB — LACTATE DEHYDROGENASE: LDH: 137 U/L (ref 98–192)

## 2020-09-21 LAB — IRON AND TIBC
Iron: 113 ug/dL (ref 28–170)
Saturation Ratios: 28 % (ref 10.4–31.8)
TIBC: 405 ug/dL (ref 250–450)
UIBC: 292 ug/dL

## 2020-09-21 LAB — RETICULOCYTES
Immature Retic Fract: 7.8 % (ref 2.3–15.9)
RBC.: 2.22 MIL/uL — ABNORMAL LOW (ref 3.87–5.11)
Retic Count, Absolute: 23.5 10*3/uL (ref 19.0–186.0)
Retic Ct Pct: 1.1 % (ref 0.4–3.1)

## 2020-09-21 LAB — FERRITIN: Ferritin: 13 ng/mL (ref 11–307)

## 2020-09-21 NOTE — Progress Notes (Signed)
Haley Trevino  Patient Care Team: [name], [place] as PCP - General (Nurse Practitioner) Clarene Essex, MD as Consulting Physician (Gastroenterology) Philemon Kingdom, MD as Consulting Physician (Internal Medicine) Hennie Duos, MD as Consulting Physician (Rheumatology) Peggyann Juba, MD as Referring Physician (Orthopedic Surgery) Artis Delay, MD as Referring Physician (Internal Medicine) Justice Britain, MD as Consulting Physician (Orthopedic Surgery) Sanjuana Kava, MD as Consulting Physician (Orthopedic Surgery) Barrett, Felisa Bonier as Physician Assistant (Cardiology) Shea Evans Norva Riffle, LCSW as [place] [facility] (Licensed Clinical Social Worker) Ilean China, RN as Case Manager  CHIEF COMPLAINTS/PURPOSE OF CONSULTATION:  Anemia  ASSESSMENT & PLAN:   Iron deficiency anemia due to chronic blood loss This is a very pleasant 80 year old female patient with a past medical history significant for scleroderma, [name], iron deficiency who was referred to hematology with chief complaint of iron deficiency anemia.  She has been taking oral over-the-counter iron for the past several years with no significant improvement in her iron profile.  She most recently started prescription iron for the past 2 weeks. She has also noticed some epigastric discomfort.  She complains of black soft stool and cannot attribute this to iron versus blood in stool. Physical examination frail appearing female patient, pale and with no other clinical findings. I reviewed her labs which showed chronic iron deficiency and moderate anemia, very symptomatic.  Have discussed about IV iron infusions.  I have discussed about Venofer infusions for a total of 5 doses.  We discussed about common side effects including flulike symptoms, headaches, fatigue and very rarely serious allergic reaction.  She is agreeable to proceeding with iron infusion, this will  be scheduled for next week.  Have also recommended that she talk to Dr. Watt Climes her gastroenterologist given epigastric discomfort and questionable GI bleed.  I also wonder if there is a significant component of malabsorption given her scleroderma and low meat intake. She will return to clinic in about 4 weeks with repeat labs.  Labs ordered for today. Thank you for consulting [place] in the care of this patient.  Please not hesitate to contact us with any additional questions or concerns.  Orders Placed This Encounter  Procedures  . CBC with Differential/Platelet    Standing Status:   Standing    Number of Occurrences:   22    Standing Expiration Date:   09/21/2021  . Iron and TIBC    Standing Status:   Future    Standing Expiration Date:   09/21/2021  . Ferritin    Standing Status:   Future    Standing Expiration Date:   09/21/2021  . Comprehensive metabolic panel    Standing Status:   Standing    Number of Occurrences:   33    Standing Expiration Date:   09/21/2021  . Lactate dehydrogenase    Standing Status:   Future    Standing Expiration Date:   09/21/2021  . Reticulocytes    Standing Status:   Future    Standing Expiration Date:   09/21/2021    HISTORY OF PRESENTING ILLNESS:   Haley Trevino 80 y.o. female is here because of anemia.  This is a very pleasant 80 year old female patient with a past medical history significant for scleroderma, [name], osteoarthritis referred to hematology for evaluation and recommendations regarding iron deficiency anemia. Ms. Finger arrived to the appointment today with her husband.  She has been feeling very tired, has no energy to go up  flights of stairs, feels very tired even with activities of daily living. She also complains of some epigastric discomfort, no pain.  She had some swallow evaluation in August for her scleroderma but she last endoscopy and colonoscopy were about 5 years ago.  She has noticed black stool but cannot tell if  this is from iron or not.  She used to be previously constipated with iron but now has black soft stool. She is mostly vegetarian but eats occasional ham and fish. She has not noticed any blood in her urine. Rest of the pertinent 10 point ROS reviewed and negative.  REVIEW OF SYSTEMS:   Constitutional: Denies fevers, chills or abnormal night sweats Eyes: Denies blurriness of vision, double vision or watery eyes Ears, nose, mouth, throat, and face: Denies mucositis or sore throat Respiratory: Denies cough, dyspnea or wheezes Cardiovascular: Denies palpitation, chest discomfort or lower extremity swelling Gastrointestinal:  Denies nausea, heartburn or change in bowel habits Skin: Denies abnormal skin rashes Lymphatics: Denies new lymphadenopathy or easy bruising Neurological:Denies numbness, tingling or new weaknesses Behavioral/Psych: Mood is stable, no new changes  All other systems were reviewed with the patient and are negative.  MEDICAL HISTORY:  Past Medical History:  Diagnosis Date  . Cataract   . Dyspnea   . Osteoarthritis   . Raynaud disease   . Scleroderma ([place])   . Thyroid disease    hypothyroidism    SURGICAL HISTORY: Past Surgical History:  Procedure Laterality Date  . BREAST ENHANCEMENT SURGERY  1975  . BREAST IMPLANT REMOVAL Bilateral 04/23/2016   Procedure: REMOVALBILATERAL BREAST IMPLANTS;  Surgeon: Wallace Going, DO;  Location: [facility];  Service: Plastics;  Laterality: Bilateral;  . CHOLECYSTECTOMY  1973  . HAND SURGERY  08/2012; 11/2012  . IR RADIOLOGIST EVAL & MGMT  07/27/2017  . IR SACROPLASTY BILATERAL  07/31/2017  . IR VERTEBROPLASTY CERV/THOR BX INC UNI/BIL INC/INJECT/IMAGING  03/13/2020  . IR VERTEBROPLASTY EA ADDL (T&LS) BX INC UNI/BIL INC INJECT/IMAGING  03/14/2020  . MELANOMA EXCISION     at 27 yrs of age  . ORIF HIP FRACTURE Right 02/22/2014   Procedure: OPEN REDUCTION INTERNAL FIXATION RIGHT HIP;  Surgeon: Sanjuana Kava, MD;   Location: [facility];  Service: Orthopedics;  Laterality: Right;  . TOTAL ABDOMINAL HYSTERECTOMY  1974    SOCIAL HISTORY: Social History   Socioeconomic History  . Marital status: Married    Spouse name: Not on file  . Number of children: 2  . Years of education: Not on file  . Highest education level: Some college, no degree  Occupational History  . Occupation: retired Occupational psychologist at [employer]  Tobacco Use  . Smoking status: Never Smoker  . Smokeless tobacco: Never Used  . Tobacco comment: passive tobacco smoke exposure as child  Vaping Use  . Vaping Use: Never used  Substance and Sexual Activity  . Alcohol use: No  . Drug use: No  . Sexual activity: Not Currently    Birth control/protection: Surgical  Other Topics Concern  . Not on file  Social History Narrative   Regular exercise: walking   Caffeine use: 1 cup of coffee daily   Social Determinants of Health   Financial Resource Strain: Low Risk   . Difficulty of Paying Living Expenses: Not hard at all  Food Insecurity: No Food Insecurity  . Worried About Charity fundraiser in the Last Year: Never true  . Ran Out of Food in the Last Year: Never true  Transportation Needs: No Transportation Needs  . Lack of Transportation (Medical): No  . Lack of Transportation (Non-Medical): No  Physical Activity: Inactive  . Days of Exercise per Week: 0 days  . Minutes of Exercise per Session: 0 min  Stress: Not on file  Social Connections: Socially Integrated  . Frequency of Communication with Friends and Family: More than three times a week  . Frequency of Social Gatherings with Friends and Family: Once a week  . Attends Religious Services: 1 to 4 times per year  . Active Member of Clubs or Organizations: Yes  . Attends Archivist Meetings: Never  . Marital Status: Married  Human resources officer Violence: Not At Risk  . Fear of Current or Ex-Partner: No  . Emotionally Abused: No  . Physically Abused: No  . Sexually  Abused: No    FAMILY HISTORY: Family History  Problem Relation Age of Onset  . Pancreatic cancer Father   . Raynaud syndrome Father   . Cancer Father        pancreatic  . Rashes / Skin problems Daughter        possibly scleraderma  . Hip fracture Mother   . Mental illness Mother        attempted suicide at 76 yo    ALLERGIES:  is allergic to dilaudid [hydromorphone hcl], acyclovir and related, crestor [rosuvastatin calcium], gabapentin, aleve [naproxen sodium], and lyrica [pregabalin].  MEDICATIONS:  Current Outpatient Medications  Medication Sig Dispense Refill  . acetaminophen (TYLENOL) 500 MG tablet Take 1,000 mg by mouth every 6 (six) hours as needed for moderate pain or headache.    . Cholecalciferol (VITAMIN D3) 2000 units TABS Take 2,000 Units by mouth daily.     . clonazePAM (KLONOPIN) 0.5 MG tablet Take 1 tablet (0.5 mg total) by mouth 2 (two) times daily as needed for anxiety. 180 tablet 1  . ezetimibe (ZETIA) 10 MG tablet Take 1 tablet (10 mg total) by mouth daily. 90 tablet 3  . Fe Fum-FA-B Cmp-C-Zn-Mg-Mn-Cu (HEMOCYTE PLUS) 106-1 MG CAPS Take 1 capsule by mouth daily. 90 capsule 1  . FLUoxetine (PROZAC) 40 MG capsule Take 1 capsule (40 mg total) by mouth daily. 90 capsule 3  . HYDROcodone-acetaminophen (NORCO/VICODIN) 5-325 MG tablet Take 1 tablet by mouth daily as needed for moderate pain. 60 tablet 0  . lisinopril (ZESTRIL) 40 MG tablet Take 1 tablet daily. 90 tablet 0  . NIFEdipine (PROCARDIA XL/NIFEDICAL XL) 60 MG 24 hr tablet Take 1 tablet (60 mg total) by mouth daily. 90 tablet 1  . omeprazole (PRILOSEC) 20 MG capsule TAKE 1 TABLET DAILY 90 capsule 1  . Probiotic Product ([place]) CAPS Take 1 capsule by mouth daily.     Marland Kitchen PROLIA 60 MG/ML SOSY injection Inject 60 mg into the skin every 6 (six) months. 180 mL 0  . sildenafil (REVATIO) 20 MG tablet Take 1 tablet (20 mg total) by mouth 2 (two) times daily. 180 tablet 1  . SYNTHROID 75 MCG tablet Take 1  tablet (75 mcg total) by mouth daily before breakfast. 90 tablet 1   No current facility-administered medications for this visit.     PHYSICAL EXAMINATION: ECOG PERFORMANCE STATUS: 0 - Asymptomatic  Vitals:   09/21/20 1247  BP: 130/72  Pulse: 77  Resp: 17  Temp: 98.2 F (36.8 C)  SpO2: 93%   Filed Weights   09/21/20 1247  Weight: 106 lb 2 oz (48.1 kg)    GENERAL:alert, no distress and comfortable  SKIN: skin color, texture, turgor are normal, no rashes or significant lesions EYES: normal, conjunctiva are pink and non-injected, sclera clear OROPHARYNX:no exudate, no erythema and lips, buccal mucosa, and tongue normal  NECK: supple, thyroid normal size, non-tender, without nodularity LYMPH:  no palpable lymphadenopathy in the cervical, axillary or inguinal LUNGS: clear to auscultation and percussion with normal breathing effort HEART: regular rate & rhythm and no murmurs and no lower extremity edema ABDOMEN:abdomen soft, non-tender and normal bowel sounds Musculoskeletal:no cyanosis of digits and no clubbing  PSYCH: alert & oriented x 3 with fluent speech NEURO: no focal motor/sensory deficits  LABORATORY DATA:  I have reviewed the data as listed Lab Results  Component Value Date   WBC 7.4 08/16/2020   HGB 8.2 (LL) 08/16/2020   HCT 21.7 (L) 08/16/2020   MCV 81 08/16/2020   PLT 417 08/16/2020     Chemistry      Component Value Date/Time   NA 139 08/16/2020 1520   K 5.3 (H) 08/16/2020 1520   CL 104 08/16/2020 1520   CO2 20 08/16/2020 1520   BUN 21 08/16/2020 1520   CREATININE 0.97 08/16/2020 1520      Component Value Date/Time   CALCIUM 9.3 08/16/2020 1520   ALKPHOS 44 08/16/2020 1520   AST 12 08/16/2020 1520   ALT 7 08/16/2020 1520   BILITOT <0.2 08/16/2020 1520     I have reviewed pertinent labs. Moderate anemia, normocytic normochromic Platelet count is normal at 417,000 She has had chronic iron deficiency for the past several years.  RADIOGRAPHIC  STUDIES: I have personally reviewed the radiological images as listed and agreed with the findings in the report. No results found.  All questions were answered. The patient knows to call the clinic with any problems, questions or concerns. I spent 45 minutes in the care of this patient including H and P, review of records, counseling and coordination of care.     Benay Pike, MD 09/21/2020 1:30 PM

## 2020-09-21 NOTE — Assessment & Plan Note (Signed)
This is a very pleasant 80 year old female patient with a past medical history significant for scleroderma, [name], iron deficiency who was referred to hematology with chief complaint of iron deficiency anemia.  She has been taking oral over-the-counter iron for the past several years with no significant improvement in her iron profile.  She most recently started prescription iron for the past 2 weeks. She has also noticed some epigastric discomfort.  She complains of black soft stool and cannot attribute this to iron versus blood in stool. Physical examination frail appearing female patient, pale and with no other clinical findings. I reviewed her labs which showed chronic iron deficiency and moderate anemia, very symptomatic.  Have discussed about IV iron infusions.  I have discussed about Venofer infusions for a total of 5 doses.  We discussed about common side effects including flulike symptoms, headaches, fatigue and very rarely serious allergic reaction.  She is agreeable to proceeding with iron infusion, this will be scheduled for next week.  Have also recommended that she talk to Dr. Watt Climes her gastroenterologist given epigastric discomfort and questionable GI bleed.  I also wonder if there is a significant component of malabsorption given her scleroderma and low meat intake. She will return to clinic in about 4 weeks with repeat labs.  Labs ordered for today. Thank you for consulting [place] in the care of this patient.  Please not hesitate to contact us with any additional questions or concerns.

## 2020-09-26 ENCOUNTER — Encounter (HOSPITAL_COMMUNITY): Payer: Self-pay

## 2020-09-26 ENCOUNTER — Inpatient Hospital Stay (HOSPITAL_COMMUNITY): Payer: Medicare Other

## 2020-09-26 ENCOUNTER — Other Ambulatory Visit: Payer: Self-pay

## 2020-09-26 VITALS — BP 117/60 | HR 71 | Temp 99.0°F | Resp 18

## 2020-09-26 DIAGNOSIS — M349 Systemic sclerosis, unspecified: Secondary | ICD-10-CM | POA: Diagnosis not present

## 2020-09-26 DIAGNOSIS — D500 Iron deficiency anemia secondary to blood loss (chronic): Secondary | ICD-10-CM

## 2020-09-26 DIAGNOSIS — D509 Iron deficiency anemia, unspecified: Secondary | ICD-10-CM | POA: Diagnosis not present

## 2020-09-26 DIAGNOSIS — E039 Hypothyroidism, unspecified: Secondary | ICD-10-CM | POA: Diagnosis not present

## 2020-09-26 DIAGNOSIS — I7300 Raynaud's syndrome without gangrene: Secondary | ICD-10-CM | POA: Diagnosis not present

## 2020-09-26 MED ORDER — SODIUM CHLORIDE 0.9 % IV SOLN
Freq: Once | INTRAVENOUS | Status: AC
Start: 2020-09-26 — End: 2020-09-26

## 2020-09-26 MED ORDER — SODIUM CHLORIDE 0.9 % IV SOLN
200.0000 mg | Freq: Once | INTRAVENOUS | Status: AC
  Administered 2020-09-26: 200 mg via INTRAVENOUS
  Filled 2020-09-26: qty 200

## 2020-09-26 NOTE — Patient Instructions (Signed)
[facility]  Discharge Instructions:  Iron infusion today.  Follow up as scheduled. _______________________________________________________________  Thank you for choosing [facility] at [facility] to provide your oncology and hematology care.  To afford each patient quality time with our providers, please arrive at least 15 minutes before your scheduled appointment.  You need to re-schedule your appointment if you arrive 10 or more minutes late.  We strive to give you quality time with our providers, and arriving late affects you and other patients whose appointments are after yours.  Also, if you no show three or more times for appointments you may be dismissed from the clinic.  Again, thank you for choosing [facility]. Our hope is that these requests will allow you access to exceptional care and in a timely manner. _______________________________________________________________  If you have questions after your visit, please contact our office at [phone] between the hours of 8:30 a.m. and 5:00 p.m. Voicemails left after 4:30 p.m. will not be returned until the following business day. _______________________________________________________________  For prescription refill requests, have your pharmacy contact our office. _______________________________________________________________  Recommendations made by the consultant and any test results will be sent to your referring physician. _______________________________________________________________ 

## 2020-09-26 NOTE — Progress Notes (Signed)
Tolerated iron infusion well today without incidence.  Vital signs stable prior to discharge.  Discharged ambulatory with husband in stable condition.

## 2020-09-27 ENCOUNTER — Ambulatory Visit: Payer: Medicare Other | Admitting: Licensed Clinical Social Worker

## 2020-09-27 DIAGNOSIS — M159 Polyosteoarthritis, unspecified: Secondary | ICD-10-CM

## 2020-09-27 DIAGNOSIS — F3342 Major depressive disorder, recurrent, in full remission: Secondary | ICD-10-CM

## 2020-09-27 DIAGNOSIS — I10 Essential (primary) hypertension: Secondary | ICD-10-CM

## 2020-09-27 DIAGNOSIS — D509 Iron deficiency anemia, unspecified: Secondary | ICD-10-CM

## 2020-09-27 DIAGNOSIS — M810 Age-related osteoporosis without current pathological fracture: Secondary | ICD-10-CM

## 2020-09-27 DIAGNOSIS — E039 Hypothyroidism, unspecified: Secondary | ICD-10-CM

## 2020-09-27 DIAGNOSIS — E785 Hyperlipidemia, unspecified: Secondary | ICD-10-CM

## 2020-09-27 DIAGNOSIS — I7300 Raynaud's syndrome without gangrene: Secondary | ICD-10-CM

## 2020-09-27 DIAGNOSIS — K219 Gastro-esophageal reflux disease without esophagitis: Secondary | ICD-10-CM

## 2020-09-27 DIAGNOSIS — M349 Systemic sclerosis, unspecified: Secondary | ICD-10-CM

## 2020-09-27 NOTE — Patient Instructions (Signed)
Visit Information  PATIENT GOALS: Goals Addressed              This Visit's Progress   .  Manage Anxiety and Stress issues faced (pt-stated)         Timeframe: Short-Term   This Visit's Progress: On Track Priority : Medium    Start Date: 09/27/20  Expected End Date 12/28/20  Completed Date  Patient Self Care Activities:  . Completes ADLs daily . Takes Medications as prescribed  Patient Coping Strengths:  . Family support . Communicates needs to spouse or family members . Self Advocates as needed  Patient Self Care Deficits:  . Anxiety or Stress Challenges  Patient Goals:  - spend time or talk with others every day - practice relaxation or meditation daily - keep a calendar with appointment dates  Follow Up Plan: LCSW to call client on 10/29/20        Norva Riffle.Anelly Samarin MSW, LCSW Licensed Clinical Social Worker [facility] Care Management [phone]

## 2020-09-27 NOTE — Chronic Care Management (AMB) (Signed)
Chronic Care Management    Clinical Social Work Note  09/27/2020 Name: ERANDI LEMMA MRN: [id number] DOB: [birth year]  AZIE MCCONAHY is a 80 y.o. year old female who is a primary care patient of [name], [place]. The CCM team was consulted to assist the patient with chronic disease management and/or care coordination needs related to: [name] .   Engaged with patient by telephone for follow up visit in response to provider referral for social work chronic care management and care coordination services.   Consent to Services:  The patient was given information about Chronic Care Management services, agreed to services, and gave verbal consent prior to initiation of services.  Please see initial visit note for detailed documentation.   Patient agreed to services and consent obtained.   Assessment: Review of patient past medical history, allergies, medications, and health status, including review of relevant consultants reports was performed today as part of a comprehensive evaluation and provision of chronic care management and care coordination services.     SDOH (Social Determinants of Health) assessments and interventions performed:    Advanced Directives Status: See Vynca application for related entries.  CCM Care Plan  Allergies  Allergen Reactions  . Dilaudid [Hydromorphone Hcl] Anaphylaxis  . Acyclovir And Related     Unknown reaction  . Crestor [Rosuvastatin Calcium] Other (See Comments)    Muscle pain  . Gabapentin Swelling    Swelling in feet, and in legs  . Aleve [Naproxen Sodium] Rash  . Lyrica [Pregabalin] Other (See Comments)    Swelling in feet, and in legs    Outpatient Encounter Medications as of 09/27/2020  Medication Sig  . acetaminophen (TYLENOL) 500 MG tablet Take 1,000 mg by mouth every 6 (six) hours as needed for moderate pain or headache.  . Cholecalciferol (VITAMIN D3) 2000 units TABS Take 2,000 Units by mouth daily.   .  clonazePAM (KLONOPIN) 0.5 MG tablet Take 1 tablet (0.5 mg total) by mouth 2 (two) times daily as needed for anxiety.  Marland Kitchen ezetimibe (ZETIA) 10 MG tablet Take 1 tablet (10 mg total) by mouth daily.  . Fe Fum-FA-B Cmp-C-Zn-Mg-Mn-Cu (HEMOCYTE PLUS) 106-1 MG CAPS Take 1 capsule by mouth daily.  Marland Kitchen FLUoxetine (PROZAC) 40 MG capsule Take 1 capsule (40 mg total) by mouth daily.  Marland Kitchen HYDROcodone-acetaminophen (NORCO/VICODIN) 5-325 MG tablet Take 1 tablet by mouth daily as needed for moderate pain.  Marland Kitchen lisinopril (ZESTRIL) 40 MG tablet Take 1 tablet daily.  Marland Kitchen NIFEdipine (PROCARDIA XL/NIFEDICAL XL) 60 MG 24 hr tablet Take 1 tablet (60 mg total) by mouth daily.  Marland Kitchen omeprazole (PRILOSEC) 20 MG capsule TAKE 1 TABLET DAILY  . Probiotic Product ([place]) CAPS Take 1 capsule by mouth daily.   Marland Kitchen PROLIA 60 MG/ML SOSY injection Inject 60 mg into the skin every 6 (six) months.  . sildenafil (REVATIO) 20 MG tablet Take 1 tablet (20 mg total) by mouth 2 (two) times daily.  Marland Kitchen SYNTHROID 75 MCG tablet Take 1 tablet (75 mcg total) by mouth daily before breakfast.   No facility-administered encounter medications on file as of 09/27/2020.    Patient Active Problem List   Diagnosis Date Noted  . History of adenomatous polyp of colon 09/21/2020  . Weight loss 09/21/2020  . Oral phase dysphagia 09/21/2020  . Noninfective gastroenteritis and colitis, unspecified 09/21/2020  . Irritable bowel syndrome 09/21/2020  . Infectious diarrhea 09/21/2020  . Breast implant rupture 09/04/2020  . Iron deficiency anemia due to chronic blood  loss 08/28/2020  . Arthralgia 07/16/2020  . Pain in right shoulder 04/11/2020  . Murmur 11/18/2017  . Impingement syndrome of left shoulder region 12/08/2014  . Osteoporosis 09/20/2014  . Hip fracture requiring operative repair ([place]) 02/22/2014  . Hypothyroidism 11/29/2013  . Depression 11/29/2013  . GERD (gastroesophageal reflux disease) 11/29/2013  . Hypertension 11/29/2013  .  Hyperlipidemia with target LDL less than 100 11/29/2013  . Thyroid cyst 04/26/2013  . MALIGNANT MELANOMA, SKIN 08/07/2010  . [name] 08/06/2010  . SCLERODERMA 08/06/2010  . Osteoarthritis 08/06/2010    Conditions to be addressed/monitored: Monitor anxiety and stress issues of client   Care Plan : LCSW Care Plan  Updates made by Katha Cabal, LCSW since 09/27/2020 12:00 AM    Problem: Anxiety Identification (Anxiety)     Goal: Anxiety Symptoms Identified   Start Date: 09/27/2020  Expected End Date: 12/28/2020  This Visit's Progress: On track  Priority: Medium  Note:   Current Barriers:  . Anxiety issues . Suicidal Ideation/Homicidal Ideation: No  Clinical Social Work Goal(s):  . patient will work with SW monthly by telephone or in person to reduce or manage symptoms related to anxiety or stress issues faced . Attend scheduled medical appointments in next 30 days  Interventions: . Patient interviewed and appropriate assessments performed:  . 1:1 collaboration with [name], FNP regarding development and update of comprehensive plan of care as evidenced by provider attestation and co-signature . Talked with client about her management of anxiety or stress issues faced . Talked with client about her recent iron infusion treatment . Talked with client about energy level of client . Talked with client about her upcoming appointment with GI doctor, Dr. Watt Climes, next Tuesday . Talked with client about pain issues of client . Talked with Charlett Nose about her upcoming medical appointments . Talked with client about ambulation of client (she said she gets dizzy sometimes) . Talked with client about mood of client  . Client spoke of being fatigued, too weak to do some activities . Talked with client about relaxation techniques (likes to read,likes to talk with her daughter via phone) . Encouraged client to call RNCM as needed for support . Collaborated with RMCM to  discuss nursing needs of client . Talked with client about difficulty standing   Patient Self Care Activities:  . Completes ADLs daily . Takes Medications as prescribed  Patient Coping Strengths:  . Family support . Communicates needs to spouse or family members . Self Advocates as needed  Patient Self Care Deficits:  . Anxiety or Stress Challenges  Patient Goals:  - spend time or talk with others every day - practice relaxation or meditation daily - keep a calendar with appointment dates  Follow Up Plan: LCSW to call client on 10/29/20      Norva Riffle.Tyson Parkison MSW, LCSW Licensed Clinical Social Worker [facility] Care Management [phone]

## 2020-09-28 ENCOUNTER — Other Ambulatory Visit: Payer: Self-pay

## 2020-09-28 ENCOUNTER — Inpatient Hospital Stay (HOSPITAL_COMMUNITY): Payer: Medicare Other

## 2020-09-28 VITALS — BP 124/59 | HR 66 | Temp 97.3°F | Resp 18

## 2020-09-28 DIAGNOSIS — I7300 Raynaud's syndrome without gangrene: Secondary | ICD-10-CM | POA: Diagnosis not present

## 2020-09-28 DIAGNOSIS — D509 Iron deficiency anemia, unspecified: Secondary | ICD-10-CM | POA: Diagnosis not present

## 2020-09-28 DIAGNOSIS — E039 Hypothyroidism, unspecified: Secondary | ICD-10-CM | POA: Diagnosis not present

## 2020-09-28 DIAGNOSIS — D500 Iron deficiency anemia secondary to blood loss (chronic): Secondary | ICD-10-CM

## 2020-09-28 DIAGNOSIS — M349 Systemic sclerosis, unspecified: Secondary | ICD-10-CM | POA: Diagnosis not present

## 2020-09-28 MED ORDER — SODIUM CHLORIDE 0.9 % IV SOLN: Freq: Once | INTRAVENOUS | Status: AC

## 2020-09-28 MED ORDER — DIPHENHYDRAMINE HCL 25 MG PO CAPS
ORAL_CAPSULE | ORAL | Status: AC
  Filled 2020-09-28: qty 1

## 2020-09-28 MED ORDER — SODIUM CHLORIDE 0.9 % IV SOLN
200.0000 mg | Freq: Once | INTRAVENOUS | Status: AC
  Administered 2020-09-28: 200 mg via INTRAVENOUS
  Filled 2020-09-28: qty 200

## 2020-09-28 NOTE — Patient Instructions (Signed)
[facility] at [facility]  Discharge Instructions:   _______________________________________________________________  Thank you for choosing [facility] at [facility] to provide your oncology and hematology care.  To afford each patient quality time with our providers, please arrive at least 15 minutes before your scheduled appointment.  You need to re-schedule your appointment if you arrive 10 or more minutes late.  We strive to give you quality time with our providers, and arriving late affects you and other patients whose appointments are after yours.  Also, if you no show three or more times for appointments you may be dismissed from the clinic.  Again, thank you for choosing [facility] at [facility]. Our hope is that these requests will allow you access to exceptional care and in a timely manner. _______________________________________________________________  If you have questions after your visit, please contact our office at [phone] between the hours of 8:30 a.m. and 5:00 p.m. Voicemails left after 4:30 p.m. will not be returned until the following business day. _______________________________________________________________  For prescription refill requests, have your pharmacy contact our office. _______________________________________________________________  Recommendations made by the consultant and any test results will be sent to your referring physician. _______________________________________________________________ 

## 2020-09-28 NOTE — Progress Notes (Signed)
Patient tolerated iron infusion with no complaints voiced.  Peripheral IV site clean and dry with good blood return noted before and after infusion.  Band aid applied.  VSS with discharge and left in satisfactory condition with no s/s of distress noted.   

## 2020-10-01 ENCOUNTER — Inpatient Hospital Stay (HOSPITAL_COMMUNITY): Payer: Medicare Other

## 2020-10-01 ENCOUNTER — Other Ambulatory Visit: Payer: Self-pay

## 2020-10-01 VITALS — BP 107/62 | HR 75 | Temp 98.0°F | Resp 18

## 2020-10-01 DIAGNOSIS — E039 Hypothyroidism, unspecified: Secondary | ICD-10-CM | POA: Diagnosis not present

## 2020-10-01 DIAGNOSIS — D500 Iron deficiency anemia secondary to blood loss (chronic): Secondary | ICD-10-CM

## 2020-10-01 DIAGNOSIS — M349 Systemic sclerosis, unspecified: Secondary | ICD-10-CM | POA: Diagnosis not present

## 2020-10-01 DIAGNOSIS — D509 Iron deficiency anemia, unspecified: Secondary | ICD-10-CM | POA: Diagnosis not present

## 2020-10-01 DIAGNOSIS — I7300 Raynaud's syndrome without gangrene: Secondary | ICD-10-CM | POA: Diagnosis not present

## 2020-10-01 MED ORDER — SODIUM CHLORIDE 0.9 % IV SOLN
200.0000 mg | Freq: Once | INTRAVENOUS | Status: AC
  Administered 2020-10-01: 200 mg via INTRAVENOUS
  Filled 2020-10-01: qty 200

## 2020-10-01 MED ORDER — SODIUM CHLORIDE 0.9 % IV SOLN
Freq: Once | INTRAVENOUS | Status: AC
Start: 2020-10-01 — End: 2020-10-01

## 2020-10-01 NOTE — Patient Instructions (Signed)
[facility] at [facility]  Discharge Instructions:  Iron Sucrose injection What is this medicine? IRON SUCROSE (AHY ern SOO krohs) is an iron complex. Iron is used to make healthy red blood cells, which carry oxygen and nutrients throughout the body. This medicine is used to treat iron deficiency anemia in people with chronic kidney disease. This medicine may be used for other purposes; ask your health care provider or pharmacist if you have questions. COMMON BRAND NAME(S): Venofer What should I tell my health care provider before I take this medicine? They need to know if you have any of these conditions:  anemia not caused by low iron levels  heart disease  high levels of iron in the blood  kidney disease  liver disease  an unusual or allergic reaction to iron, other medicines, foods, dyes, or preservatives  pregnant or trying to get pregnant  breast-feeding How should I use this medicine? This medicine is for infusion into a vein. It is given by a health care professional in a hospital or clinic setting. Talk to your pediatrician regarding the use of this medicine in children. While this drug may be prescribed for children as young as 2 years for selected conditions, precautions do apply. Overdosage: If you think you have taken too much of this medicine contact a poison control center or emergency room at once. NOTE: This medicine is only for you. Do not share this medicine with others. What if I miss a dose? It is important not to miss your dose. Call your doctor or health care professional if you are unable to keep an appointment. What may interact with this medicine? Do not take this medicine with any of the following medications:  deferoxamine  dimercaprol  other iron products This medicine may also interact with the following medications:  chloramphenicol  deferasirox This list may not describe all possible interactions. Give your health  care provider a list of all the medicines, herbs, non-prescription drugs, or dietary supplements you use. Also tell them if you smoke, drink alcohol, or use illegal drugs. Some items may interact with your medicine. What should I watch for while using this medicine? Visit your doctor or healthcare professional regularly. Tell your doctor or healthcare professional if your symptoms do not start to get better or if they get worse. You may need blood work done while you are taking this medicine. You may need to follow a special diet. Talk to your doctor. Foods that contain iron include: whole grains/cereals, dried fruits, beans, or peas, leafy green vegetables, and organ meats (liver, kidney). What side effects may I notice from receiving this medicine? Side effects that you should report to your doctor or health care professional as soon as possible:  allergic reactions like skin rash, itching or hives, swelling of the face, lips, or tongue  breathing problems  changes in blood pressure  cough  fast, irregular heartbeat  feeling faint or lightheaded, falls  fever or chills  flushing, sweating, or hot feelings  joint or muscle aches/pains  seizures  swelling of the ankles or feet  unusually weak or tired Side effects that usually do not require medical attention (report to your doctor or health care professional if they continue or are bothersome):  diarrhea  feeling achy  headache  irritation at site where injected  nausea, vomiting  stomach upset  tiredness This list may not describe all possible side effects. Call your doctor for medical advice about side effects. You   may report side effects to FDA at 1-800-FDA-1088. Where should I keep my medicine? This drug is given in a hospital or clinic and will not be stored at home. NOTE: This sheet is a summary. It may not cover all possible information. If you have questions about this medicine, talk to your doctor,  pharmacist, or health care provider.  2021 Elsevier/Gold Standard (2011-04-03 17:14:35)  _______________________________________________________________  Thank you for choosing [facility] at [facility] to provide your oncology and hematology care.  To afford each patient quality time with our providers, please arrive at least 15 minutes before your scheduled appointment.  You need to re-schedule your appointment if you arrive 10 or more minutes late.  We strive to give you quality time with our providers, and arriving late affects you and other patients whose appointments are after yours.  Also, if you no show three or more times for appointments you may be dismissed from the clinic.  Again, thank you for choosing [facility] at [facility]. Our hope is that these requests will allow you access to exceptional care and in a timely manner. _______________________________________________________________  If you have questions after your visit, please contact our office at [phone] between the hours of 8:30 a.m. and 5:00 p.m. Voicemails left after 4:30 p.m. will not be returned until the following business day. _______________________________________________________________  For prescription refill requests, have your pharmacy contact our office. _______________________________________________________________  Recommendations made by the consultant and any test results will be sent to your referring physician. _______________________________________________________________ 

## 2020-10-01 NOTE — Progress Notes (Signed)
Haley Trevino presents today for IV Venofer infusion per MDs orders in supportive therapy plan. Vitals have been reviewed and are stable and within parameters for infusion today. Infusion given without incident or complaint. Patient observed for 30 minutes post infusion per orders. VSS upon completion of infusion, IV flushed and removed per protocol, see MAR and IV flowsheet for details. Discharged in satisfactory condition with follow up instructions.

## 2020-10-02 ENCOUNTER — Other Ambulatory Visit: Payer: Self-pay | Admitting: Gastroenterology

## 2020-10-02 DIAGNOSIS — D500 Iron deficiency anemia secondary to blood loss (chronic): Secondary | ICD-10-CM | POA: Diagnosis not present

## 2020-10-02 DIAGNOSIS — R109 Unspecified abdominal pain: Secondary | ICD-10-CM | POA: Diagnosis not present

## 2020-10-03 ENCOUNTER — Other Ambulatory Visit: Payer: Self-pay

## 2020-10-03 ENCOUNTER — Encounter (HOSPITAL_COMMUNITY): Payer: Self-pay

## 2020-10-03 ENCOUNTER — Inpatient Hospital Stay (HOSPITAL_COMMUNITY): Payer: Medicare Other

## 2020-10-03 VITALS — BP 128/45 | HR 73 | Temp 97.7°F | Resp 17

## 2020-10-03 DIAGNOSIS — E039 Hypothyroidism, unspecified: Secondary | ICD-10-CM | POA: Diagnosis not present

## 2020-10-03 DIAGNOSIS — D500 Iron deficiency anemia secondary to blood loss (chronic): Secondary | ICD-10-CM

## 2020-10-03 DIAGNOSIS — D509 Iron deficiency anemia, unspecified: Secondary | ICD-10-CM | POA: Diagnosis not present

## 2020-10-03 DIAGNOSIS — M349 Systemic sclerosis, unspecified: Secondary | ICD-10-CM | POA: Diagnosis not present

## 2020-10-03 DIAGNOSIS — I7300 Raynaud's syndrome without gangrene: Secondary | ICD-10-CM | POA: Diagnosis not present

## 2020-10-03 MED ORDER — SODIUM CHLORIDE 0.9 % IV SOLN
200.0000 mg | Freq: Once | INTRAVENOUS | Status: AC
  Administered 2020-10-03: 200 mg via INTRAVENOUS
  Filled 2020-10-03: qty 200

## 2020-10-03 MED ORDER — SODIUM CHLORIDE 0.9 % IV SOLN
Freq: Once | INTRAVENOUS | Status: AC
Start: 2020-10-03 — End: 2020-10-03

## 2020-10-03 NOTE — Progress Notes (Signed)
Tolerated iron infusion well today without incidence and stable at discharge.  Vital signs stable prior to discharge.  Discharged in stable condition ambulatory with husband.

## 2020-10-03 NOTE — Patient Instructions (Signed)
[place] at [facility]  Discharge Instructions:  Iron transfusion today.  Follow up with your PCP about blood pressure.  Return as scheduled.  Please call the clinic if you have any questions or concerns. _______________________________________________________________  Thank you for choosing [place] at [facility] to provide your oncology and hematology care.  To afford each patient quality time with our providers, please arrive at least 15 minutes before your scheduled appointment.  You need to re-schedule your appointment if you arrive 10 or more minutes late.  We strive to give you quality time with our providers, and arriving late affects you and other patients whose appointments are after yours.  Also, if you no show three or more times for appointments you may be dismissed from the clinic.  Again, thank you for choosing [place] at [place] hope is that these requests will allow you access to exceptional care and in a timely manner. _______________________________________________________________  If you have questions after your visit, please contact our office at [phone] between the hours of 8:30 a.m. and 5:00 p.m. Voicemails left after 4:30 p.m. will not be returned until the following business day. _______________________________________________________________  For prescription refill requests, have your pharmacy contact our office. _______________________________________________________________  Recommendations made by the consultant and any test results will be sent to your referring physician. _______________________________________________________________

## 2020-10-05 ENCOUNTER — Encounter (HOSPITAL_COMMUNITY): Payer: Self-pay

## 2020-10-05 ENCOUNTER — Other Ambulatory Visit: Payer: Self-pay

## 2020-10-05 ENCOUNTER — Inpatient Hospital Stay (HOSPITAL_COMMUNITY): Payer: Medicare Other | Attending: Hematology and Oncology

## 2020-10-05 ENCOUNTER — Telehealth: Payer: Self-pay | Admitting: *Deleted

## 2020-10-05 VITALS — BP 119/44 | HR 75 | Temp 97.0°F | Resp 18 | Wt 110.6 lb

## 2020-10-05 DIAGNOSIS — K219 Gastro-esophageal reflux disease without esophagitis: Secondary | ICD-10-CM | POA: Insufficient documentation

## 2020-10-05 DIAGNOSIS — Z79899 Other long term (current) drug therapy: Secondary | ICD-10-CM | POA: Insufficient documentation

## 2020-10-05 DIAGNOSIS — D509 Iron deficiency anemia, unspecified: Secondary | ICD-10-CM | POA: Diagnosis not present

## 2020-10-05 DIAGNOSIS — I10 Essential (primary) hypertension: Secondary | ICD-10-CM | POA: Diagnosis not present

## 2020-10-05 DIAGNOSIS — M349 Systemic sclerosis, unspecified: Secondary | ICD-10-CM | POA: Diagnosis not present

## 2020-10-05 DIAGNOSIS — E785 Hyperlipidemia, unspecified: Secondary | ICD-10-CM | POA: Diagnosis not present

## 2020-10-05 DIAGNOSIS — D500 Iron deficiency anemia secondary to blood loss (chronic): Secondary | ICD-10-CM

## 2020-10-05 DIAGNOSIS — E039 Hypothyroidism, unspecified: Secondary | ICD-10-CM | POA: Insufficient documentation

## 2020-10-05 DIAGNOSIS — R0609 Other forms of dyspnea: Secondary | ICD-10-CM | POA: Insufficient documentation

## 2020-10-05 DIAGNOSIS — M1990 Unspecified osteoarthritis, unspecified site: Secondary | ICD-10-CM | POA: Diagnosis not present

## 2020-10-05 MED ORDER — SODIUM CHLORIDE 0.9 % IV SOLN
200.0000 mg | Freq: Once | INTRAVENOUS | Status: AC
  Administered 2020-10-05: 200 mg via INTRAVENOUS
  Filled 2020-10-05: qty 200

## 2020-10-05 MED ORDER — SODIUM CHLORIDE 0.9 % IV SOLN
Freq: Once | INTRAVENOUS | Status: AC
Start: 2020-10-05 — End: 2020-10-05

## 2020-10-05 NOTE — Patient Instructions (Signed)
Venofer infusion today. Return as scheduled for Venofer infusion.

## 2020-10-05 NOTE — Telephone Encounter (Signed)
Patient has picked up Prolia from the pharmacy and will call first of next week to schedule her appointment/

## 2020-10-05 NOTE — Progress Notes (Signed)
Tolerated infusion w/o adverse reaction.  Alert, in no distress.  VSS.  Discharged ambulatory in stable condition.  

## 2020-10-08 ENCOUNTER — Ambulatory Visit (INDEPENDENT_AMBULATORY_CARE_PROVIDER_SITE_OTHER): Payer: Medicare Other | Admitting: *Deleted

## 2020-10-08 ENCOUNTER — Other Ambulatory Visit: Payer: Self-pay

## 2020-10-08 DIAGNOSIS — M810 Age-related osteoporosis without current pathological fracture: Secondary | ICD-10-CM | POA: Diagnosis not present

## 2020-10-08 MED ORDER — DENOSUMAB 60 MG/ML ~~LOC~~ SOSY
60.0000 mg | PREFILLED_SYRINGE | Freq: Once | SUBCUTANEOUS | Status: AC
  Administered 2020-10-08: 60 mg via SUBCUTANEOUS

## 2020-10-08 NOTE — Progress Notes (Signed)
Pt tolerated Prolia injection well.

## 2020-10-16 ENCOUNTER — Telehealth: Payer: Medicare Other | Admitting: *Deleted

## 2020-10-18 NOTE — Progress Notes (Signed)
Attempted to obtain medical history via telephone, unable to reach at this time. I left a voicemail to return pre surgical testing department's phone call.  

## 2020-10-22 ENCOUNTER — Other Ambulatory Visit (HOSPITAL_COMMUNITY): Payer: Self-pay | Admitting: Physician Assistant

## 2020-10-22 ENCOUNTER — Other Ambulatory Visit (HOSPITAL_COMMUNITY)
Admission: RE | Admit: 2020-10-22 | Discharge: 2020-10-22 | Disposition: A | Discharge status: Home / Self Care | Payer: Medicare Other | Source: Ambulatory Visit | Attending: Gastroenterology | Admitting: Gastroenterology

## 2020-10-22 ENCOUNTER — Other Ambulatory Visit: Payer: Self-pay

## 2020-10-22 ENCOUNTER — Encounter (HOSPITAL_COMMUNITY): Payer: Self-pay | Admitting: Gastroenterology

## 2020-10-22 DIAGNOSIS — Z20822 Contact with and (suspected) exposure to covid-19: Secondary | ICD-10-CM | POA: Insufficient documentation

## 2020-10-22 DIAGNOSIS — Z01812 Encounter for preprocedural laboratory examination: Secondary | ICD-10-CM | POA: Insufficient documentation

## 2020-10-22 DIAGNOSIS — D500 Iron deficiency anemia secondary to blood loss (chronic): Secondary | ICD-10-CM

## 2020-10-23 ENCOUNTER — Encounter (HOSPITAL_COMMUNITY): Payer: Self-pay | Admitting: Physician Assistant

## 2020-10-23 ENCOUNTER — Inpatient Hospital Stay (HOSPITAL_BASED_OUTPATIENT_CLINIC_OR_DEPARTMENT_OTHER): Payer: Medicare Other | Admitting: Physician Assistant

## 2020-10-23 ENCOUNTER — Other Ambulatory Visit: Payer: Self-pay

## 2020-10-23 VITALS — BP 155/59 | HR 68 | Temp 98.2°F | Resp 16 | Ht 62.0 in | Wt 111.1 lb

## 2020-10-23 DIAGNOSIS — D500 Iron deficiency anemia secondary to blood loss (chronic): Secondary | ICD-10-CM | POA: Diagnosis not present

## 2020-10-23 DIAGNOSIS — M349 Systemic sclerosis, unspecified: Secondary | ICD-10-CM | POA: Diagnosis not present

## 2020-10-23 DIAGNOSIS — R0609 Other forms of dyspnea: Secondary | ICD-10-CM | POA: Diagnosis not present

## 2020-10-23 DIAGNOSIS — D509 Iron deficiency anemia, unspecified: Secondary | ICD-10-CM | POA: Diagnosis not present

## 2020-10-23 DIAGNOSIS — E785 Hyperlipidemia, unspecified: Secondary | ICD-10-CM | POA: Diagnosis not present

## 2020-10-23 DIAGNOSIS — K219 Gastro-esophageal reflux disease without esophagitis: Secondary | ICD-10-CM | POA: Diagnosis not present

## 2020-10-23 DIAGNOSIS — Z79899 Other long term (current) drug therapy: Secondary | ICD-10-CM | POA: Diagnosis not present

## 2020-10-23 LAB — SARS CORONAVIRUS 2 (TAT 6-24 HRS): SARS Coronavirus 2: NEGATIVE

## 2020-10-23 NOTE — Patient Instructions (Signed)
[place] at [facility] Discharge Instructions  You were seen today by Tarri Abernethy PA-C for your iron deficiency anemia.    LABS: Return in 3 months for repeat labs   OTHER TESTS: EGD with gastroenterology, as scheduled with your GI doctor  MEDICATIONS: No changes  FOLLOW-UP APPOINTMENT: Return in 3 months for follow-up visit   Thank you for choosing [place] at [facility] to provide your oncology and hematology care.  To afford each patient quality time with our provider, please arrive at least 15 minutes before your scheduled appointment time.   If you have a lab appointment with the [facility] please come in thru the Main Entrance and check in at the main information desk.  You need to re-schedule your appointment should you arrive 10 or more minutes late.  We strive to give you quality time with our providers, and arriving late affects you and other patients whose appointments are after yours.  Also, if you no show three or more times for appointments you may be dismissed from the clinic at the providers discretion.     Again, thank you for choosing [facility].  Our hope is that these requests will decrease the amount of time that you wait before being seen by our physicians.       _____________________________________________________________  Should you have questions after your visit to [facility], please contact our office at [phone] and follow the prompts.  Our office hours are 8:00 a.m. and 4:30 p.m. Monday - Friday.  Please note that voicemails left after 4:00 p.m. may not be returned until the following business day.  We are closed weekends and major holidays.  You do have access to a nurse 24-7, just call the main number to the clinic [phone] and do not press any options, hold on the line and a nurse will answer the phone.    For prescription refill requests, have your pharmacy  contact our office and allow 72 hours.    Due to Covid, you will need to wear a mask upon entering the hospital. If you do not have a mask, a mask will be given to you at the Main Entrance upon arrival. For doctor visits, patients may have 1 support person age 46 or older with them. For treatment visits, patients can not have anyone with them due to social distancing guidelines and our immunocompromised population.

## 2020-10-23 NOTE — Progress Notes (Signed)
[address] [place], [place] [address]   CLINIC:  Medical Oncology/Hematology  PCP:  [name], [place] [address] [phone]   REASON FOR VISIT:  Follow-up for iron deficiency anemia  CURRENT THERAPY: Intermittent IV iron (Venofer)  INTERVAL HISTORY:  Ms. Karstens 80 y.o. female returns for routine follow-up of her iron deficiency anemia.  She was initially seen in consultation by Dr. Chryl Heck on 09/22/2019.  Since that time, she has received IV Venofer x5 (last dose on 10/05/2020).  At her last visit, the patient had complained of some black stool and epigastric discomfort, which at this time cannot be attributed to blood in stool versus effects of iron supplementation.  She was encouraged to follow-up with her established gastroenterologist, and is scheduled for EGD later this week.  She I still having epigastric discomfort after eating.  She continues to ave black stool.  Her iron deficiency may also be due to malabsorption given her underlying scleroderma and low meat intake, as well as use of PPI.  Today's visit, she reports that she feels slightly better following IV iron infusions.  She has been complaining of significant fatigue at her last visit, but this has improved somewhat, not yet back to baseline.  She has been having dyspnea on exertion, but this is also slightly improved.  She has no other complaints at the time of today's visit.  She has 70% energy and 80% appetite. She endorses that she is maintaining a stable weight.    REVIEW OF SYSTEMS:  Review of Systems  Constitutional: Positive for fatigue. Negative for appetite change, chills, diaphoresis, fever and unexpected weight change.  HENT:   Negative for lump/mass and nosebleeds.   Eyes: Negative for eye problems.  Respiratory: Positive for shortness of breath (with extertion). Negative for cough and hemoptysis.   Cardiovascular: Negative for chest  pain, leg swelling and palpitations.  Gastrointestinal: Positive for abdominal pain (Epigastric pain after eating). Negative for blood in stool, constipation, diarrhea, nausea and vomiting.  Genitourinary: Negative for hematuria.   Skin: Negative.   Neurological: Negative for dizziness, headaches and light-headedness.  Hematological: Does not bruise/bleed easily.      PAST MEDICAL/SURGICAL HISTORY:  Past Medical History:  Diagnosis Date  . Cataract   . Depression   . Dyspnea   . GERD (gastroesophageal reflux disease)   . Hyperlipidemia   . Hypertension   . Hypothyroidism   . IBS (irritable bowel syndrome)   . Osteoarthritis   . Raynaud disease   . Scleroderma ([place])   . Thyroid disease    hypothyroidism   Past Surgical History:  Procedure Laterality Date  . BREAST ENHANCEMENT SURGERY  1975  . BREAST IMPLANT REMOVAL Bilateral 04/23/2016   Procedure: REMOVALBILATERAL BREAST IMPLANTS;  Surgeon: Wallace Going, DO;  Location: [facility];  Service: Plastics;  Laterality: Bilateral;  . CHOLECYSTECTOMY  1973  . HAND SURGERY  08/2012; 11/2012  . IR RADIOLOGIST EVAL & MGMT  07/27/2017  . IR SACROPLASTY BILATERAL  07/31/2017  . IR VERTEBROPLASTY CERV/THOR BX INC UNI/BIL INC/INJECT/IMAGING  03/13/2020  . IR VERTEBROPLASTY EA ADDL (T&LS) BX INC UNI/BIL INC INJECT/IMAGING  03/14/2020  . MELANOMA EXCISION     at 61 yrs of age  . ORIF HIP FRACTURE Right 02/22/2014   Procedure: OPEN REDUCTION INTERNAL FIXATION RIGHT HIP;  Surgeon: Sanjuana Kava, MD;  Location: [facility];  Service: Orthopedics;  Laterality: Right;  . TOTAL ABDOMINAL HYSTERECTOMY  1974     SOCIAL HISTORY:  Social History   Socioeconomic History  . Marital status: Married    Spouse name: Not on file  . Number of children: 2  . Years of education: Not on file  . Highest education level: Some college, no degree  Occupational History  . Occupation: retired Occupational psychologist at [employer]  Tobacco Use  . Smoking  status: Never Smoker  . Smokeless tobacco: Never Used  . Tobacco comment: passive tobacco smoke exposure as child  Vaping Use  . Vaping Use: Never used  Substance and Sexual Activity  . Alcohol use: No  . Drug use: No  . Sexual activity: Not Currently    Birth control/protection: Surgical  Other Topics Concern  . Not on file  Social History Narrative   Regular exercise: walking   Caffeine use: 1 cup of coffee daily   Social Determinants of Health   Financial Resource Strain: Low Risk   . Difficulty of Paying Living Expenses: Not hard at all  Food Insecurity: No Food Insecurity  . Worried About Charity fundraiser in the Last Year: Never true  . Ran Out of Food in the Last Year: Never true  Transportation Needs: No Transportation Needs  . Lack of Transportation (Medical): No  . Lack of Transportation (Non-Medical): No  Physical Activity: Inactive  . Days of Exercise per Week: 0 days  . Minutes of Exercise per Session: 0 min  Stress: Not on file  Social Connections: Socially Integrated  . Frequency of Communication with Friends and Family: More than three times a week  . Frequency of Social Gatherings with Friends and Family: Once a week  . Attends Religious Services: 1 to 4 times per year  . Active Member of Clubs or Organizations: Yes  . Attends Archivist Meetings: Never  . Marital Status: Married  Human resources officer Violence: Not At Risk  . Fear of Current or Ex-Partner: No  . Emotionally Abused: No  . Physically Abused: No  . Sexually Abused: No    FAMILY HISTORY:  Family History  Problem Relation Age of Onset  . Pancreatic cancer Father   . Raynaud syndrome Father   . Cancer Father        pancreatic  . Rashes / Skin problems Daughter        possibly scleraderma  . Hip fracture Mother   . Mental illness Mother        attempted suicide at 65 yo    CURRENT MEDICATIONS:  Outpatient Encounter Medications as of 10/23/2020  Medication Sig  .  acetaminophen (TYLENOL) 500 MG tablet Take 1,000 mg by mouth every 6 (six) hours as needed for moderate pain or headache.  . Cholecalciferol (VITAMIN D) 50 MCG (2000 UT) tablet Take 2,000 Units by mouth daily.  . clonazePAM (KLONOPIN) 0.5 MG tablet Take 1 tablet (0.5 mg total) by mouth 2 (two) times daily as needed for anxiety. (Patient taking differently: Take 0.5 mg by mouth See admin instructions. Take 0.5 mg daily at bedtime, may take a second 0.5 mg dose as needed for anxiety)  . ezetimibe (ZETIA) 10 MG tablet Take 1 tablet (10 mg total) by mouth daily.  . Fe Fum-FA-B Cmp-C-Zn-Mg-Mn-Cu (HEMOCYTE PLUS) 106-1 MG CAPS Take 1 capsule by mouth daily.  Marland Kitchen FLUoxetine (PROZAC) 40 MG capsule Take 1 capsule (40 mg total) by mouth daily.  Marland Kitchen HYDROcodone-acetaminophen (NORCO/VICODIN) 5-325 MG tablet Take 1 tablet by mouth daily as needed for moderate pain. (Patient  taking differently: Take 1 tablet by mouth at bedtime as needed for moderate pain.)  . lisinopril (ZESTRIL) 40 MG tablet Take 1 tablet daily. (Patient taking differently: Take 40 mg by mouth daily.)  . NIFEdipine (PROCARDIA XL/NIFEDICAL XL) 60 MG 24 hr tablet Take 1 tablet (60 mg total) by mouth daily.  Marland Kitchen omeprazole (PRILOSEC) 20 MG capsule TAKE 1 TABLET DAILY (Patient taking differently: Take 20 mg by mouth daily.)  . Probiotic Product ([place]) CAPS Take 1 capsule by mouth daily.   Marland Kitchen PROLIA 60 MG/ML SOSY injection Inject 60 mg into the skin every 6 (six) months.  . sildenafil (REVATIO) 20 MG tablet Take 1 tablet (20 mg total) by mouth 2 (two) times daily.  Marland Kitchen SYNTHROID 75 MCG tablet Take 1 tablet (75 mcg total) by mouth daily before breakfast.   No facility-administered encounter medications on file as of 10/23/2020.    ALLERGIES:  Allergies  Allergen Reactions  . Dilaudid [Hydromorphone Hcl] Anaphylaxis  . Dilaudid [Hydromorphone] Anaphylaxis  . Acyclovir And Related     Unknown reaction  . Crestor [Rosuvastatin Calcium]  Other (See Comments)    Muscle pain  . Gabapentin Swelling    Swelling in feet, and in legs  . Aleve [Naproxen Sodium] Rash  . Lyrica [Pregabalin] Other (See Comments)    Swelling in feet, and in legs     PHYSICAL EXAM:  ECOG PERFORMANCE STATUS: 1 - Symptomatic but completely ambulatory  Vitals:   10/23/20 1112  BP: (!) 155/59  Pulse: 68  Resp: 16  Temp: 98.2 F (36.8 C)  SpO2: 99%   Filed Weights   10/23/20 1112  Weight: 111 lb 1.8 oz (50.4 kg)   Physical Exam Constitutional:      Appearance: Normal appearance.  HENT:     Head: Normocephalic and atraumatic.     Mouth/Throat:     Mouth: Mucous membranes are moist.  Eyes:     Extraocular Movements: Extraocular movements intact.     Pupils: Pupils are equal, round, and reactive to light.  Cardiovascular:     Rate and Rhythm: Normal rate and regular rhythm.     Pulses: Normal pulses.     Heart sounds: Murmur (Systolic murmur, previously known) heard.    Pulmonary:     Effort: Pulmonary effort is normal.     Breath sounds: Normal breath sounds.  Abdominal:     General: Bowel sounds are normal.     Palpations: Abdomen is soft.     Tenderness: There is no abdominal tenderness.  Musculoskeletal:        General: No swelling.     Right lower leg: No edema.     Left lower leg: No edema.  Lymphadenopathy:     Cervical: No cervical adenopathy.  Skin:    General: Skin is warm and dry.  Neurological:     General: No focal deficit present.     Mental Status: She is alert and oriented to person, place, and time.  Psychiatric:        Mood and Affect: Mood normal.        Behavior: Behavior normal.      LABORATORY DATA:  I have reviewed the labs as listed.  CBC    Component Value Date/Time   WBC 7.0 09/21/2020 1332   RBC 4.01 09/21/2020 1332   RBC 2.22 (L) 09/21/2020 1332   HGB 10.2 (L) 09/21/2020 1332   HGB 8.2 (LL) 08/16/2020 1520   HCT 33.3 (L) 09/21/2020 1332  HCT 21.7 (L) 08/16/2020 1520   PLT 428 (H)  09/21/2020 1332   PLT 417 08/16/2020 1520   MCV 83.0 09/21/2020 1332   MCV 81 08/16/2020 1520   MCH 25.4 (L) 09/21/2020 1332   MCHC 30.6 09/21/2020 1332   RDW 21.3 (H) 09/21/2020 1332   RDW 21.3 (H) 08/16/2020 1520   LYMPHSABS 1.7 09/21/2020 1332   LYMPHSABS 1.5 08/16/2020 1520   MONOABS 0.4 09/21/2020 1332   EOSABS 0.1 09/21/2020 1332   EOSABS 0.1 08/16/2020 1520   BASOSABS 0.0 09/21/2020 1332   BASOSABS 0.0 08/16/2020 1520   CMP Latest Ref Rng & Units 09/21/2020 08/16/2020 05/14/2020  Glucose 70 - 99 mg/dL 122(H) 96 124(H)  BUN 8 - 23 mg/dL 19 21 11   Creatinine 0.44 - 1.00 mg/dL 1.00 0.97 0.81  Sodium 135 - 145 mmol/L 137 139 139  Potassium 3.5 - 5.1 mmol/L 4.0 5.3(H) 4.3  Chloride 98 - 111 mmol/L 105 104 105  CO2 22 - 32 mmol/L 22 20 24   Calcium 8.9 - 10.3 mg/dL 9.2 9.3 8.3(L)  Total Protein 6.5 - 8.1 g/dL 7.5 6.5 6.7  Total Bilirubin 0.3 - 1.2 mg/dL 0.2(L) <0.2 <0.2  Alkaline Phos 38 - 126 U/L 34(L) 44 45  AST 15 - 41 U/L 20 12 12   ALT 0 - 44 U/L 15 7 5     DIAGNOSTIC IMAGING:  I have independently reviewed the relevant imaging and discussed with the patient.  ASSESSMENT: 1.  Iron deficiency anemia - Labs obtained 09/21/2020 significant for normocytic anemia (Hgb 10.2, MCV 83.0) and iron deficiency (ferritin 13) - Suspect possible chronic blood loss in the setting of melena, although black stools may also be attributable to oral iron supplementation - May also be an aspect of malabsorption in the setting of her scleroderma and low meat intake, as well as use of PPI - Received IV Venofer x 5 (last dose 10/05/2020)  PLAN:  1.  Iron deficiency anemia - Patient symptoms are somewhat improved after IV Venofer x 5 - Encouraged to follow-up with GI for possible melena and suspected chronic blood loss - EGD scheduled for later this months - Repeat labs and RTC in 3 months, will also check SPEP/IFE/FLC at that time to rule out any underlying plasma cell dyscrasia that could be  contributing to anemia   PLAN SUMMARY & DISPOSITION: -Labs and RTC in 3 months  All questions were answered. The patient knows to call the clinic with any problems, questions or concerns.  Medical decision making: Low  Time spent on visit: I spent 15 minutes counseling the patient face to face. The total time spent in the appointment was 25 minutes and more than 50% was on counseling.   Harriett Rush, PA-C  10/23/20 11:30 AM

## 2020-10-24 ENCOUNTER — Encounter: Payer: Self-pay | Admitting: General Practice

## 2020-10-24 NOTE — Progress Notes (Signed)
[place] Psychosocial Distress Screening Clinical Social Work  Clinical Social Work was referred by distress screening protocol.  The patient scored a 6 on the Psychosocial Distress Thermometer which indicates moderate distress. Clinical Social Worker contacted patient by phone to assess for distress and other psychosocial needs. "I am doing OK, had a check up yesterday at [facility]."  Is being treated for anemia.  Will have procedure tomorrow at [facility].  She is primary caregiver for husband who is s/p stroke.  "His mind is getting bad."  Has vascular dementia.  Causes stress for both husband and patient.  Husband does the heavy housework, cooks meals, does all yard work, takes care of himself.  Husband does grocery shopping.  Is able to drive.  Memory is major concern.  "He is short of breath and doesn't have as much strength as he used to have."  Husband is discussing memory issues w his PCP and stroke doctor.  "He knows and has counseling, he's OK, he just cant remember things."    Good support from patient's daughter, grandchildren.   No needs at this time.  ONCBCN DISTRESS SCREENING 10/23/2020  Screening Type Initial Screening  Distress experienced in past week (1-10) 6  Family Problem type Partner  Emotional problem type Nervousness/Anxiety    Clinical Social Worker follow up needed: No.  If yes, follow up plan:  Beverely Pace, [place], LCSW Clinical Social Worker Phone:  [phone]

## 2020-10-25 ENCOUNTER — Ambulatory Visit (HOSPITAL_COMMUNITY): Payer: Medicare Other | Admitting: Registered Nurse

## 2020-10-25 ENCOUNTER — Ambulatory Visit (HOSPITAL_COMMUNITY)
Admission: RE | Admit: 2020-10-25 | Discharge: 2020-10-25 | Disposition: A | Discharge status: Home / Self Care | Payer: Medicare Other | Attending: Gastroenterology | Admitting: Gastroenterology

## 2020-10-25 ENCOUNTER — Encounter (HOSPITAL_COMMUNITY): Payer: Self-pay | Admitting: Gastroenterology

## 2020-10-25 ENCOUNTER — Other Ambulatory Visit: Payer: Self-pay

## 2020-10-25 ENCOUNTER — Encounter (HOSPITAL_COMMUNITY)
Admission: RE | Disposition: A | Discharge status: Home / Self Care | Payer: Self-pay | Source: Home / Self Care | Attending: Gastroenterology

## 2020-10-25 DIAGNOSIS — K5520 Angiodysplasia of colon without hemorrhage: Secondary | ICD-10-CM | POA: Insufficient documentation

## 2020-10-25 DIAGNOSIS — Z79890 Hormone replacement therapy (postmenopausal): Secondary | ICD-10-CM | POA: Diagnosis not present

## 2020-10-25 DIAGNOSIS — Z79899 Other long term (current) drug therapy: Secondary | ICD-10-CM | POA: Insufficient documentation

## 2020-10-25 DIAGNOSIS — Z87892 Personal history of anaphylaxis: Secondary | ICD-10-CM | POA: Diagnosis not present

## 2020-10-25 DIAGNOSIS — K31819 Angiodysplasia of stomach and duodenum without bleeding: Secondary | ICD-10-CM | POA: Diagnosis not present

## 2020-10-25 DIAGNOSIS — Z888 Allergy status to other drugs, medicaments and biological substances status: Secondary | ICD-10-CM | POA: Insufficient documentation

## 2020-10-25 DIAGNOSIS — R1013 Epigastric pain: Secondary | ICD-10-CM | POA: Insufficient documentation

## 2020-10-25 DIAGNOSIS — D500 Iron deficiency anemia secondary to blood loss (chronic): Secondary | ICD-10-CM | POA: Insufficient documentation

## 2020-10-25 DIAGNOSIS — E039 Hypothyroidism, unspecified: Secondary | ICD-10-CM | POA: Diagnosis not present

## 2020-10-25 DIAGNOSIS — Z885 Allergy status to narcotic agent status: Secondary | ICD-10-CM | POA: Insufficient documentation

## 2020-10-25 DIAGNOSIS — D509 Iron deficiency anemia, unspecified: Secondary | ICD-10-CM | POA: Diagnosis not present

## 2020-10-25 HISTORY — PX: ESOPHAGOGASTRODUODENOSCOPY: SHX5428

## 2020-10-25 HISTORY — DX: Hypothyroidism, unspecified: E03.9

## 2020-10-25 HISTORY — DX: Essential (primary) hypertension: I10

## 2020-10-25 HISTORY — DX: Gastro-esophageal reflux disease without esophagitis: K21.9

## 2020-10-25 HISTORY — DX: Hyperlipidemia, unspecified: E78.5

## 2020-10-25 HISTORY — DX: Depression, unspecified: F32.A

## 2020-10-25 HISTORY — DX: Irritable bowel syndrome, unspecified: K58.9

## 2020-10-25 HISTORY — PX: HOT HEMOSTASIS: SHX5433

## 2020-10-25 SURGERY — EGD (ESOPHAGOGASTRODUODENOSCOPY)
Anesthesia: Monitor Anesthesia Care

## 2020-10-25 MED ORDER — LACTATED RINGERS IV SOLN
INTRAVENOUS | Status: DC
  Administered 2020-10-25: 1000 mL via INTRAVENOUS

## 2020-10-25 MED ORDER — PROPOFOL 500 MG/50ML IV EMUL
INTRAVENOUS | Status: DC | PRN
  Administered 2020-10-25: 140 ug/kg/min via INTRAVENOUS

## 2020-10-25 MED ORDER — SODIUM CHLORIDE 0.9 % IV SOLN: INTRAVENOUS | Status: DC

## 2020-10-25 MED ORDER — PROPOFOL 10 MG/ML IV BOLUS
INTRAVENOUS | Status: DC | PRN
  Administered 2020-10-25: 20 mg via INTRAVENOUS
  Administered 2020-10-25: 30 mg via INTRAVENOUS
  Administered 2020-10-25: 20 mg via INTRAVENOUS

## 2020-10-25 NOTE — Anesthesia Postprocedure Evaluation (Signed)
Anesthesia Post Note  Patient: Haley Trevino  Procedure(s) Performed: ESOPHAGOGASTRODUODENOSCOPY (EGD) (N/A ) HOT HEMOSTASIS (ARGON PLASMA COAGULATION/BICAP) (N/A )     Patient location during evaluation: Endoscopy Anesthesia Type: MAC Level of consciousness: awake and alert Pain management: pain level controlled Vital Signs Assessment: post-procedure vital signs reviewed and stable Respiratory status: spontaneous breathing, nonlabored ventilation and respiratory function stable Cardiovascular status: blood pressure returned to baseline and stable Postop Assessment: no apparent nausea or vomiting Anesthetic complications: no   No complications documented.  Last Vitals:  Vitals:   10/25/20 1400 10/25/20 1410  BP: 99/60 (!) 114/41  Pulse: 69 67  Resp: 17 (!) 21  Temp:    SpO2: 93% 90%    Last Pain:  Vitals:   10/25/20 1410  TempSrc:   PainSc: 0-No pain                 Lidia Collum

## 2020-10-25 NOTE — Progress Notes (Signed)
Haley Trevino 12:58 PM  Subjective: Patient without any new problems since she was seen recently in the office and did get some iron infusions and was guaiac negative in our office and only has minimal midepigastric discomfort and no other complaints  Objective: Vital signs stable afebrile no acute distress exam please see preassessment evaluation hemoglobin increased to 10.2 ferritin improved a little chemistries okay CT okay  Assessment: Iron deficiency questionable etiology  Plan: Okay to proceed with endoscopy and possible enteroscope with anesthesia assistance  [facility]  office [phone] After 5PM or if no answer call [phone]

## 2020-10-25 NOTE — Op Note (Signed)
[facility] Patient Name: Haley Trevino Procedure Date: 10/25/2020 MRN: [id number] Attending MD: Clarene Essex , MD Date of Birth: [birth year] CSN: [id number] Age: 80 Admit Type: Inpatient Procedure:                Upper GI endoscopy Indications:              Epigastric abdominal pain, Iron deficiency anemia Providers:                Clarene Essex, MD, Nelia Shi, RN, Tyrone Apple, Technician, Courtney Heys. [place], CRNA Referring MD:              Medicines:                Propofol per Anesthesia Complications:            No immediate complications. Estimated Blood Loss:     Estimated blood loss: none. Procedure:                Pre-Anesthesia Assessment:                           - Prior to the procedure, a History and Physical                            was performed, and patient medications and                            allergies were reviewed. The patient's tolerance of                            previous anesthesia was also reviewed. The risks                            and benefits of the procedure and the sedation                            options and risks were discussed with the patient.                            All questions were answered, and informed consent                            was obtained. Prior Anticoagulants: The patient has                            taken no previous anticoagulant or antiplatelet                            agents. ASA Grade Assessment: III - A patient with                            severe systemic disease. After reviewing the risks  and benefits, the patient was deemed in                            satisfactory condition to undergo the procedure.                           After obtaining informed consent, the endoscope was                            passed under direct vision. Throughout the                            procedure, the patient's blood pressure, pulse,  and                            oxygen saturations were monitored continuously. The                            [id number]-H190 ([id number]) was introduced through the                            mouth, and advanced to the fourth part of the                            duodenum and 2 AVMs were seen and we elected to                            then place the colonic ultraslim scope and it was                            advanced a good ways down the jejunum and another                            AVM was seen. The upper GI endoscopy was                            accomplished without difficulty. The patient                            tolerated the procedure well. Scope In: Scope Out: Findings:      The larynx was normal.      The examined esophagus was normal.      The entire examined stomach was normal.      Two small angiodysplastic lesions without bleeding were found in the       second portion of the duodenum and in the fourth portion of the       duodenum. Fulguration to ablate the lesion to prevent bleeding by argon       plasma at 2 liters/minute and 20 watts was successful.      A single small angiodysplastic lesion without bleeding was found in the       jejunum. Fulguration to ablate the lesion to prevent bleeding by argon       plasma at 2 liters/minute and 20 watts was successful.  The exam was otherwise without abnormality. Impression:               - Normal larynx.                           - Normal esophagus.                           - Normal stomach.                           - Two non-bleeding angiodysplastic lesions in the                            duodenum. Treated with argon plasma coagulation                            (APC).                           - A single non-bleeding angiodysplastic lesion in                            the jejunum. Treated with argon plasma coagulation                            (APC).                           - The examination was otherwise  normal. All APC's                            were done using the ultraslim colonoscope at the                            end of the procedure                           - No specimens collected. Moderate Sedation:      Not Applicable - Patient had care per Anesthesia. Recommendation:           - Clear liquid diet for 2 hours. Then soft solids                            today                           - Continue present medications.                           - Return to GI clinic in 4 weeks.                           - Telephone GI clinic if symptomatic PRN. Procedure Code(s):        --- Professional ---  43255, Esophagogastroduodenoscopy, flexible,                            transoral; with control of bleeding, any method Diagnosis Code(s):        --- Professional ---                           K31.819, Angiodysplasia of stomach and duodenum                            without bleeding                           K55.20, Angiodysplasia of colon without hemorrhage                           R10.13, Epigastric pain                           D50.9, Iron deficiency anemia, unspecified CPT copyright 2019 American Medical Association. All rights reserved. The codes documented in this report are preliminary and upon coder review may  be revised to meet current compliance requirements. Clarene Essex, MD 10/25/2020 2:06:54 PM This report has been signed electronically. Number of Addenda: 0

## 2020-10-25 NOTE — Anesthesia Preprocedure Evaluation (Signed)
Anesthesia Evaluation  Patient identified by MRN, date of birth, ID band Patient awake    Reviewed: Allergy & Precautions, NPO status , Patient's Chart, lab work & pertinent test results  Airway Mallampati: II  TM Distance: >3 FB Neck ROM: Full    Dental  (+) Upper Dentures, Lower Dentures   Pulmonary shortness of breath,    Pulmonary exam normal        Cardiovascular hypertension, Pt. on medications  Rhythm:Regular Rate:Normal     Neuro/Psych Depression negative neurological ROS     GI/Hepatic Neg liver ROS, GERD  Medicated,  Endo/Other  Hypothyroidism   Renal/GU negative Renal ROS  negative genitourinary   Musculoskeletal  (+) Arthritis , Osteoarthritis,    Abdominal (+)  Abdomen: soft. Bowel sounds: normal.  Peds  Hematology  (+) anemia ,   Anesthesia Other Findings   Reproductive/Obstetrics                             Anesthesia Physical Anesthesia Plan  ASA: II  Anesthesia Plan: MAC   Post-op Pain Management:    Induction: Intravenous  PONV Risk Score and Plan: 2 and Propofol infusion  Airway Management Planned: Natural Airway, Simple Face Mask and Nasal Cannula  Additional Equipment: None  Intra-op Plan:   Post-operative Plan:   Informed Consent: I have reviewed the patients History and Physical, chart, labs and discussed the procedure including the risks, benefits and alternatives for the proposed anesthesia with the patient or authorized representative who has indicated his/her understanding and acceptance.     Dental advisory given  Plan Discussed with: CRNA  Anesthesia Plan Comments: (Lab Results      Component                Value               Date                      WBC                      7.0                 09/21/2020                HGB                      10.2 (L)            09/21/2020                HCT                      33.3 (L)             09/21/2020                MCV                      83.0                09/21/2020                PLT                      428 (H)             09/21/2020          )  Anesthesia Quick Evaluation  

## 2020-10-25 NOTE — Discharge Instructions (Signed)
Call if question or problem otherwise follow-up in 1 month to decide if any further work-up and plans are needed like possibly capsule endoscopy or even another colonoscopy and continue to see hematology per their routine  YOU HAD AN ENDOSCOPIC PROCEDURE TODAY: Refer to the procedure report and other information in the discharge instructions given to you for any specific questions about what was found during the examination. If this information does not answer your questions, please call [facility] office at [phone] to clarify.   YOU SHOULD EXPECT: Some feelings of bloating in the abdomen. Passage of more gas than usual. Walking can help get rid of the air that was put into your GI tract during the procedure and reduce the bloating. If you had a lower endoscopy (such as a colonoscopy or flexible sigmoidoscopy) you may notice spotting of blood in your stool or on the toilet paper. Some abdominal soreness may be present for a day or two, also.  DIET: Your first meal following the procedure should be a light meal and then it is ok to progress to your normal diet. A half-sandwich or bowl of soup is an example of a good first meal. Heavy or fried foods are harder to digest and may make you feel nauseous or bloated. Drink plenty of fluids but you should avoid alcoholic beverages for 24 hours. If you had a esophageal dilation, please see attached instructions for diet.    ACTIVITY: Your care partner should take you home directly after the procedure. You should plan to take it easy, moving slowly for the rest of the day. You can resume normal activity the day after the procedure however YOU SHOULD NOT DRIVE, use power tools, machinery or perform tasks that involve climbing or major physical exertion for 24 hours (because of the sedation medicines used during the test).   SYMPTOMS TO REPORT IMMEDIATELY: A gastroenterologist can be reached at any hour. Please call [phone]  for any of the following symptoms:   . Following lower endoscopy (colonoscopy, flexible sigmoidoscopy) Excessive amounts of blood in the stool  Significant tenderness, worsening of abdominal pains  Swelling of the abdomen that is new, acute  Fever of 100 or higher  . Following upper endoscopy (EGD, EUS, ERCP, esophageal dilation) Vomiting of blood or coffee ground material  New, significant abdominal pain  New, significant chest pain or pain under the shoulder blades  Painful or persistently difficult swallowing  New shortness of breath  Black, tarry-looking or red, bloody stools  FOLLOW UP:  If any biopsies were taken you will be contacted by phone or by letter within the next 1-3 weeks. Call [phone]  if you have not heard about the biopsies in 3 weeks.  Please also call with any specific questions about appointments or follow up tests.

## 2020-10-25 NOTE — Transfer of Care (Signed)
Immediate Anesthesia Transfer of Care Note  Patient: Haley Trevino  Procedure(s) Performed: ESOPHAGOGASTRODUODENOSCOPY (EGD) (N/A ) HOT HEMOSTASIS (ARGON PLASMA COAGULATION/BICAP) (N/A )  Patient Location: Endoscopy Unit  Anesthesia Type:MAC  Level of Consciousness: awake  Airway & Oxygen Therapy: Patient Spontanous Breathing  Post-op Assessment: Report given to RN and Post -op Vital signs reviewed and stable  Post vital signs: Reviewed and stable  Last Vitals:  Vitals Value Taken Time  BP 119/96 10/25/20 1352  Temp    Pulse    Resp 21 10/25/20 1354  SpO2    Vitals shown include unvalidated device data.  Last Pain:  Vitals:   10/25/20 1234  TempSrc: Oral  PainSc: 0-No pain         Complications: No complications documented.

## 2020-10-29 ENCOUNTER — Encounter (HOSPITAL_COMMUNITY): Payer: Self-pay | Admitting: Gastroenterology

## 2020-10-29 ENCOUNTER — Telehealth: Payer: Medicare Other

## 2020-11-12 ENCOUNTER — Other Ambulatory Visit: Payer: Self-pay

## 2020-11-12 ENCOUNTER — Ambulatory Visit (INDEPENDENT_AMBULATORY_CARE_PROVIDER_SITE_OTHER): Payer: Medicare Other | Admitting: Nurse Practitioner

## 2020-11-12 ENCOUNTER — Encounter: Payer: Self-pay | Admitting: Nurse Practitioner

## 2020-11-12 VITALS — BP 119/54 | HR 69 | Temp 99.3°F | Resp 20 | Ht 62.0 in | Wt 109.0 lb

## 2020-11-12 DIAGNOSIS — I7300 Raynaud's syndrome without gangrene: Secondary | ICD-10-CM | POA: Diagnosis not present

## 2020-11-12 DIAGNOSIS — F419 Anxiety disorder, unspecified: Secondary | ICD-10-CM

## 2020-11-12 DIAGNOSIS — D500 Iron deficiency anemia secondary to blood loss (chronic): Secondary | ICD-10-CM | POA: Diagnosis not present

## 2020-11-12 DIAGNOSIS — E039 Hypothyroidism, unspecified: Secondary | ICD-10-CM

## 2020-11-12 DIAGNOSIS — E785 Hyperlipidemia, unspecified: Secondary | ICD-10-CM

## 2020-11-12 DIAGNOSIS — I10 Essential (primary) hypertension: Secondary | ICD-10-CM

## 2020-11-12 DIAGNOSIS — M349 Systemic sclerosis, unspecified: Secondary | ICD-10-CM

## 2020-11-12 DIAGNOSIS — K219 Gastro-esophageal reflux disease without esophagitis: Secondary | ICD-10-CM

## 2020-11-12 DIAGNOSIS — K582 Mixed irritable bowel syndrome: Secondary | ICD-10-CM

## 2020-11-12 DIAGNOSIS — F3342 Major depressive disorder, recurrent, in full remission: Secondary | ICD-10-CM | POA: Diagnosis not present

## 2020-11-12 LAB — HEMOGLOBIN, FINGERSTICK: Hemoglobin: 12 g/dL (ref 11.1–15.9)

## 2020-11-12 MED ORDER — CLONAZEPAM 0.5 MG PO TABS: 0.5000 mg | ORAL_TABLET | Freq: Two times a day (BID) | ORAL | 1 refills | Status: DC | PRN

## 2020-11-12 MED ORDER — EZETIMIBE 10 MG PO TABS: 10.0000 mg | ORAL_TABLET | Freq: Every day | ORAL | 3 refills | Status: DC

## 2020-11-12 MED ORDER — FLUOXETINE HCL 40 MG PO CAPS: 40.0000 mg | ORAL_CAPSULE | Freq: Every day | ORAL | 3 refills | Status: DC

## 2020-11-12 MED ORDER — OMEPRAZOLE 20 MG PO CPDR: DELAYED_RELEASE_CAPSULE | ORAL | 1 refills | Status: DC

## 2020-11-12 MED ORDER — SYNTHROID 75 MCG PO TABS: 75.0000 ug | ORAL_TABLET | Freq: Every day | ORAL | 1 refills | Status: DC

## 2020-11-12 MED ORDER — NIFEDIPINE ER OSMOTIC RELEASE 60 MG PO TB24: 60.0000 mg | ORAL_TABLET | Freq: Every day | ORAL | 1 refills | Status: DC

## 2020-11-12 MED ORDER — SILDENAFIL CITRATE 20 MG PO TABS: 20.0000 mg | ORAL_TABLET | Freq: Two times a day (BID) | ORAL | 1 refills | Status: DC

## 2020-11-12 MED ORDER — LISINOPRIL 40 MG PO TABS: 40.0000 mg | ORAL_TABLET | Freq: Every day | ORAL | 0 refills | Status: DC

## 2020-11-12 NOTE — Patient Instructions (Signed)
[employer] medicine (25th ed., pp. [phone]). [place], [place]: Elsevier.">  Anemia  Anemia is a condition in which there is not enough red blood cells or hemoglobin in the blood. Hemoglobin is a substance in red blood cells that carries oxygen. When you do not have enough red blood cells or hemoglobin (are anemic), your body cannot get enough oxygen and your organs may not work properly. As a result, you may feel very tired or have other problems. What are the causes? Common causes of anemia include:  Excessive bleeding. Anemia can be caused by excessive bleeding inside or outside the body, including bleeding from the intestines or from heavy menstrual periods in females.  Poor nutrition.  Long-lasting (chronic) kidney, thyroid, and liver disease.  Bone marrow disorders, spleen problems, and blood disorders.  Cancer and treatments for cancer.  HIV (human immunodeficiency virus) and AIDS (acquired immunodeficiency syndrome).  Infections, medicines, and autoimmune disorders that destroy red blood cells. What are the signs or symptoms? Symptoms of this condition include:  Minor weakness.  Dizziness.  Headache, or difficulties concentrating and sleeping.  Heartbeats that feel irregular or faster than normal (palpitations).  Shortness of breath, especially with exercise.  Pale skin, lips, and nails, or cold hands and feet.  Indigestion and nausea. Symptoms may occur suddenly or develop slowly. If your anemia is mild, you may not have symptoms. How is this diagnosed? This condition is diagnosed based on blood tests, your medical history, and a physical exam. In some cases, a test may be needed in which cells are removed from the soft tissue inside of a bone and looked at under a microscope (bone marrow biopsy). Your health care provider may also check your stool (feces) for blood and may do additional testing to look for the cause of your bleeding. Other tests may  include:  Imaging tests, such as a CT scan or MRI.  A procedure to see inside your esophagus and stomach (endoscopy).  A procedure to see inside your colon and rectum (colonoscopy). How is this treated? Treatment for this condition depends on the cause. If you continue to lose a lot of blood, you may need to be treated at a hospital. Treatment may include:  Taking supplements of iron, vitamin Q68, or folic acid.  Taking a hormone medicine (erythropoietin) that can help to stimulate red blood cell growth.  Having a blood transfusion. This may be needed if you lose a lot of blood.  Making changes to your diet.  Having surgery to remove your spleen. Follow these instructions at home:  Take over-the-counter and prescription medicines only as told by your health care provider.  Take supplements only as told by your health care provider.  Follow any diet instructions that you were given by your health care provider.  Keep all follow-up visits as told by your health care provider. This is important. Contact a health care provider if:  You develop new bleeding anywhere in the body. Get help right away if:  You are very weak.  You are short of breath.  You have pain in your abdomen or chest.  You are dizzy or feel faint.  You have trouble concentrating.  You have bloody stools, black stools, or tarry stools.  You vomit repeatedly or you vomit up blood. These symptoms may represent a serious problem that is an emergency. Do not wait to see if the symptoms will go away. Get medical help right away. Call your local emergency services (911 in the [place] Do not  drive yourself to the hospital. Summary  Anemia is a condition in which you do not have enough red blood cells or enough of a substance in your red blood cells that carries oxygen (hemoglobin).  Symptoms may occur suddenly or develop slowly.  If your anemia is mild, you may not have symptoms.  This condition is  diagnosed with blood tests, a medical history, and a physical exam. Other tests may be needed.  Treatment for this condition depends on the cause of the anemia. This information is not intended to replace advice given to you by your health care provider. Make sure you discuss any questions you have with your health care provider. Document Revised: 05/31/2019 Document Reviewed: 05/31/2019 Elsevier Patient Education  2021 [employer].  

## 2020-11-12 NOTE — Progress Notes (Addendum)
Subjective:    Patient ID: Haley Trevino, female    DOB: 06-Feb-1941, 80 y.o.   MRN: [id number]   Chief Complaint: Medical Management of Chronic Issues    HPI:  1. Primary hypertension No c/o chest pain, sob or headache. Doe snot check blood pressure at home.  BP Readings from Last 3 Encounters:  10/25/20 (!) 114/41  10/23/20 (!) 155/59  10/05/20 (!) 119/44     2. [name] without gangrene Is doing ok right ow, but does better when wether is warm.  3. Gastroesophageal reflux disease without esophagitis Is on omeprazole daily and is working well.  4. Irritable bowel syndrome with both constipation and diarrhea Has had diarrhea the last 3 days , but this is the first flare up in awhile.  5. Acquired hypothyroidism No problems that aware of Lab Results  Component Value Date   TSH 1.790 08/16/2020     6. Recurrent major depressive disorder, in full remission ([place]) Is doing better since she has been getitng counseling  7. Hyperlipidemia with target LDL less than 100 Has a very poor appetite Lab Results  Component Value Date   CHOL 151 08/16/2020   HDL 47 08/16/2020   LDLCALC 84 08/16/2020   TRIG 110 08/16/2020   CHOLHDL 3.2 08/16/2020     8. Iron deficiency anemia due to chronic blood loss Had endoscopy on the May 21. He cauterized several places and hopefully hgb will come back up. But they found no stomach bleed. She has had 5 iron infusions and needs hgb checked today. Lab Results  Component Value Date   HGB 10.2 (L) 09/21/2020     9. SCLERODERMA See rheumatology every 6 months. No changes to plan of care.    Outpatient Encounter Medications as of 11/12/2020  Medication Sig  . acetaminophen (TYLENOL) 500 MG tablet Take 1,000 mg by mouth every 6 (six) hours as needed for moderate pain or headache.  . Cholecalciferol (VITAMIN D) 50 MCG (2000 UT) tablet Take 2,000 Units by mouth daily.  . clonazePAM (KLONOPIN) 0.5 MG tablet Take 1 tablet  (0.5 mg total) by mouth 2 (two) times daily as needed for anxiety. (Patient taking differently: Take 0.5 mg by mouth See admin instructions. Take 0.5 mg daily at bedtime, may take a second 0.5 mg dose as needed for anxiety)  . ezetimibe (ZETIA) 10 MG tablet Take 1 tablet (10 mg total) by mouth daily.  . Fe Fum-FA-B Cmp-C-Zn-Mg-Mn-Cu (HEMOCYTE PLUS) 106-1 MG CAPS Take 1 capsule by mouth daily.  Marland Kitchen FLUoxetine (PROZAC) 40 MG capsule Take 1 capsule (40 mg total) by mouth daily.  Marland Kitchen HYDROcodone-acetaminophen (NORCO/VICODIN) 5-325 MG tablet Take 1 tablet by mouth daily as needed for moderate pain. (Patient taking differently: Take 1 tablet by mouth at bedtime as needed for moderate pain.)  . lisinopril (ZESTRIL) 40 MG tablet Take 1 tablet daily. (Patient taking differently: Take 40 mg by mouth daily.)  . NIFEdipine (PROCARDIA XL/NIFEDICAL XL) 60 MG 24 hr tablet Take 1 tablet (60 mg total) by mouth daily.  Marland Kitchen omeprazole (PRILOSEC) 20 MG capsule TAKE 1 TABLET DAILY (Patient taking differently: Take 20 mg by mouth daily.)  . PROLIA 60 MG/ML SOSY injection Inject 60 mg into the skin every 6 (six) months.  . sildenafil (REVATIO) 20 MG tablet Take 1 tablet (20 mg total) by mouth 2 (two) times daily.  Marland Kitchen SYNTHROID 75 MCG tablet Take 1 tablet (75 mcg total) by mouth daily before breakfast.  . [DISCONTINUED] Probiotic Product (  PHILLIPS COLON HEALTH) CAPS Take 1 capsule by mouth daily.    No facility-administered encounter medications on file as of 11/12/2020.    Past Surgical History:  Procedure Laterality Date  . BREAST ENHANCEMENT SURGERY  1975  . BREAST IMPLANT REMOVAL Bilateral 04/23/2016   Procedure: REMOVALBILATERAL BREAST IMPLANTS;  Surgeon: Wallace Going, DO;  Location: [place];  Service: Plastics;  Laterality: Bilateral;  . CHOLECYSTECTOMY  1973  . ESOPHAGOGASTRODUODENOSCOPY N/A 10/25/2020   Procedure: ESOPHAGOGASTRODUODENOSCOPY (EGD);  Surgeon: Clarene Essex, MD;  Location: [facility];  Service: Endoscopy;  Laterality: N/A;  enteroscopy  . HAND SURGERY  08/2012; 11/2012  . HOT HEMOSTASIS N/A 10/25/2020   Procedure: HOT HEMOSTASIS (ARGON PLASMA COAGULATION/BICAP);  Surgeon: Clarene Essex, MD;  Location: [facility];  Service: Endoscopy;  Laterality: N/A;  . IR RADIOLOGIST EVAL & MGMT  07/27/2017  . IR SACROPLASTY BILATERAL  07/31/2017  . IR VERTEBROPLASTY CERV/THOR BX INC UNI/BIL INC/INJECT/IMAGING  03/13/2020  . IR VERTEBROPLASTY EA ADDL (T&LS) BX INC UNI/BIL INC INJECT/IMAGING  03/14/2020  . MELANOMA EXCISION     at 50 yrs of age  . ORIF HIP FRACTURE Right 02/22/2014   Procedure: OPEN REDUCTION INTERNAL FIXATION RIGHT HIP;  Surgeon: Sanjuana Kava, MD;  Location: [facility];  Service: Orthopedics;  Laterality: Right;  . TOTAL ABDOMINAL HYSTERECTOMY  1974    Family History  Problem Relation Age of Onset  . Pancreatic cancer Father   . Raynaud syndrome Father   . Cancer Father        pancreatic  . Rashes / Skin problems Daughter        possibly scleraderma  . Hip fracture Mother   . Mental illness Mother        attempted suicide at 9 yo    New complaints: None today  Social history: Lives with her husband. 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  Eyes: Negative for pain.  Respiratory: Negative for shortness of breath.   Cardiovascular: Negative for chest pain, palpitations and leg swelling.  Gastrointestinal: Negative for abdominal pain.  Endocrine: Negative for polydipsia.  Skin: Negative for rash.  Neurological: Negative for dizziness, weakness and headaches.  Hematological: Does not bruise/bleed easily.  All other systems reviewed and are negative.      Objective:   Physical Exam Vitals and nursing note reviewed.  Constitutional:      General: She is not in acute distress.    Appearance: Normal appearance. She is well-developed.  HENT:     Head:  Normocephalic.     Nose: Nose normal.  Eyes:     Pupils: Pupils are equal, round, and reactive to light.  Neck:     Vascular: No carotid bruit or JVD.  Cardiovascular:     Rate and Rhythm: Normal rate and regular rhythm.     Heart sounds: Murmur (2/6) heard.    Pulmonary:     Effort: Pulmonary effort is normal. No respiratory distress.     Breath sounds: Normal breath sounds. No wheezing or rales.  Chest:     Chest wall: No tenderness.  Abdominal:     General: Bowel sounds are normal. There is no distension or abdominal bruit.     Palpations: Abdomen is soft. There is no hepatomegaly, splenomegaly, mass or pulsatile mass.     Tenderness: There is no abdominal tenderness.  Musculoskeletal:  General: Normal range of motion.     Cervical back: Normal range of motion and neck supple.  Lymphadenopathy:     Cervical: No cervical adenopathy.  Skin:    General: Skin is warm and dry.     Coloration: Skin is pale.     Comments: Tips of fingers are ednmatous and pale-cold to touch.  Neurological:     Mental Status: She is alert and oriented to person, place, and time.     Deep Tendon Reflexes: Reflexes are normal and symmetric.  Psychiatric:        Behavior: Behavior normal.        Thought Content: Thought content normal.        Judgment: Judgment normal.    BP (!) 119/54   Pulse 69   Temp 99.3 F (37.4 C) (Temporal)   Resp 20   Ht '5\' 2"'  (1.575 m)   Wt 109 lb (49.4 kg)   SpO2 98%   BMI 19.94 kg/m         Assessment & Plan:   ALIYYAH RIESE comes in today with chief complaint of Medical Management of Chronic Issues   Diagnosis and orders addressed:  1. Primary hypertension Low sodium diet - CBC with Differential/Platelet - CMP14+EGFR - lisinopril (ZESTRIL) 40 MG tablet; Take 1 tablet (40 mg total) by mouth daily.  Dispense: 90 tablet; Refill: 0  2. [name] without gangrene Keep finger warm - NIFEdipine (PROCARDIA XL/NIFEDICAL XL) 60 MG 24 hr  tablet; Take 1 tablet (60 mg total) by mouth daily.  Dispense: 90 tablet; Refill: 1 - sildenafil (REVATIO) 20 MG tablet; Take 1 tablet (20 mg total) by mouth 2 (two) times daily.  Dispense: 180 tablet; Refill: 1  3. Gastroesophageal reflux disease without esophagitis Avoid spicy foods Do not eat 2 hours prior to bedtime - omeprazole (PRILOSEC) 20 MG capsule; TAKE 1 TABLET DAILY  Dispense: 90 capsule; Refill: 1  4. Irritable bowel syndrome with both constipation and diarrhea Watch diet to prevent flare up  5. Acquired hypothyroidism Labs pedning - Thyroid Panel With TSH - SYNTHROID 75 MCG tablet; Take 1 tablet (75 mcg total) by mouth daily before breakfast.  Dispense: 90 tablet; Refill: 1  6. Recurrent major depressive disorder, in full remission ([place]) Stress management - FLUoxetine (PROZAC) 40 MG capsule; Take 1 capsule (40 mg total) by mouth daily.  Dispense: 90 capsule; Refill: 3  7. Hyperlipidemia with target LDL less than 100 Low fta diet - Lipid panel - ezetimibe (ZETIA) 10 MG tablet; Take 1 tablet (10 mg total) by mouth daily.  Dispense: 90 tablet; Refill: 3  8. Iron deficiency anemia due to chronic blood loss Labs oending  9. SCLERODERMA Keep follow uo with rheumatology  10. Anxiety - clonazePAM (KLONOPIN) 0.5 MG tablet; Take 1 tablet (0.5 mg total) by mouth 2 (two) times daily as needed for anxiety.  Dispense: 180 tablet; Refill: 1   Labs pending Health Maintenance reviewed Diet and exercise encouraged  Follow up plan: 3 months   Mary-Margaret Hassell Done, FNP

## 2020-11-13 LAB — LIPID PANEL
Chol/HDL Ratio: 4.2 ratio (ref 0.0–4.4)
Cholesterol, Total: 203 mg/dL — ABNORMAL HIGH (ref 100–199)
HDL: 48 mg/dL (ref >39)
LDL Chol Calc (NIH): 131 mg/dL — ABNORMAL HIGH (ref 0–99)
Triglycerides: 135 mg/dL (ref 0–149)
VLDL Cholesterol Cal: 24 mg/dL (ref 5–40)

## 2020-11-13 LAB — THYROID PANEL WITH TSH
Free Thyroxine Index: 2.6 (ref 1.2–4.9)
T3 Uptake Ratio: 27 % (ref 24–39)
T4, Total: 9.6 ug/dL (ref 4.5–12.0)
TSH: 1.41 u[IU]/mL (ref 0.450–4.500)

## 2020-11-13 LAB — CMP14+EGFR
ALT: 10 IU/L (ref 0–32)
AST: 15 IU/L (ref 0–40)
Albumin/Globulin Ratio: 1.6 (ref 1.2–2.2)
Albumin: 4.2 g/dL (ref 3.7–4.7)
Alkaline Phosphatase: 51 IU/L (ref 44–121)
BUN/Creatinine Ratio: 14 (ref 12–28)
BUN: 11 mg/dL (ref 8–27)
Bilirubin Total: <0.2 mg/dL (ref 0.0–1.2)
CO2: 21 mmol/L (ref 20–29)
Calcium: 8.7 mg/dL (ref 8.7–10.3)
Chloride: 103 mmol/L (ref 96–106)
Creatinine, Ser: 0.8 mg/dL (ref 0.57–1.00)
Globulin, Total: 2.7 g/dL (ref 1.5–4.5)
Glucose: 83 mg/dL (ref 65–99)
Potassium: 4.6 mmol/L (ref 3.5–5.2)
Sodium: 139 mmol/L (ref 134–144)
Total Protein: 6.9 g/dL (ref 6.0–8.5)
eGFR: 75 mL/min/{1.73_m2} (ref >59)

## 2020-11-13 LAB — CBC WITH DIFFERENTIAL/PLATELET
Basophils Absolute: 0.1 10*3/uL (ref 0.0–0.2)
Basos: 1 %
EOS (ABSOLUTE): 0.1 10*3/uL (ref 0.0–0.4)
Eos: 2 %
Hematocrit: 31.5 % — ABNORMAL LOW (ref 34.0–46.6)
Hemoglobin: 11.4 g/dL (ref 11.1–15.9)
Immature Grans (Abs): 0 10*3/uL (ref 0.0–0.1)
Immature Granulocytes: 0 %
Lymphocytes Absolute: 1.6 10*3/uL (ref 0.7–3.1)
Lymphs: 23 %
MCH: 34.1 pg — ABNORMAL HIGH (ref 26.6–33.0)
MCHC: 36.2 g/dL — ABNORMAL HIGH (ref 31.5–35.7)
MCV: 94 fL (ref 79–97)
Monocytes Absolute: 0.5 10*3/uL (ref 0.1–0.9)
Monocytes: 7 %
Neutrophils Absolute: 4.5 10*3/uL (ref 1.4–7.0)
Neutrophils: 67 %
Platelets: 369 10*3/uL (ref 150–450)
RBC: 3.34 x10E6/uL — ABNORMAL LOW (ref 3.77–5.28)
RDW: 17.6 % — ABNORMAL HIGH (ref 11.7–15.4)
WBC: 6.8 10*3/uL (ref 3.4–10.8)

## 2020-11-22 DIAGNOSIS — D500 Iron deficiency anemia secondary to blood loss (chronic): Secondary | ICD-10-CM | POA: Diagnosis not present

## 2020-11-22 DIAGNOSIS — R109 Unspecified abdominal pain: Secondary | ICD-10-CM | POA: Diagnosis not present

## 2020-11-27 DIAGNOSIS — H18523 Epithelial (juvenile) corneal dystrophy, bilateral: Secondary | ICD-10-CM | POA: Diagnosis not present

## 2020-11-27 DIAGNOSIS — H16223 Keratoconjunctivitis sicca, not specified as Sjogren's, bilateral: Secondary | ICD-10-CM | POA: Diagnosis not present

## 2020-11-29 DIAGNOSIS — D500 Iron deficiency anemia secondary to blood loss (chronic): Secondary | ICD-10-CM | POA: Diagnosis not present

## 2020-12-05 ENCOUNTER — Ambulatory Visit (INDEPENDENT_AMBULATORY_CARE_PROVIDER_SITE_OTHER): Payer: Medicare Other | Admitting: Licensed Clinical Social Worker

## 2020-12-05 DIAGNOSIS — M159 Polyosteoarthritis, unspecified: Secondary | ICD-10-CM

## 2020-12-05 DIAGNOSIS — K219 Gastro-esophageal reflux disease without esophagitis: Secondary | ICD-10-CM

## 2020-12-05 DIAGNOSIS — I10 Essential (primary) hypertension: Secondary | ICD-10-CM | POA: Diagnosis not present

## 2020-12-05 DIAGNOSIS — M810 Age-related osteoporosis without current pathological fracture: Secondary | ICD-10-CM

## 2020-12-05 DIAGNOSIS — E785 Hyperlipidemia, unspecified: Secondary | ICD-10-CM

## 2020-12-05 DIAGNOSIS — M349 Systemic sclerosis, unspecified: Secondary | ICD-10-CM

## 2020-12-05 DIAGNOSIS — I7300 Raynaud's syndrome without gangrene: Secondary | ICD-10-CM

## 2020-12-05 DIAGNOSIS — E039 Hypothyroidism, unspecified: Secondary | ICD-10-CM

## 2020-12-05 DIAGNOSIS — F3342 Major depressive disorder, recurrent, in full remission: Secondary | ICD-10-CM | POA: Diagnosis not present

## 2020-12-05 NOTE — Patient Instructions (Signed)
Visit Information  PATIENT GOALS: Goals Addressed              This Visit's Progress   .  Manage Anxiety and Stress issues faced (pt-stated)         Timeframe: Short-Term Goal  This Visit's Progress: On Track Priority : Medium    Start Date: 12/05/20  Expected End Date 03/05/21  Completed Date  Patient Self Care Activities:  . Completes ADLs daily . Takes Medications as prescribed  Patient Coping Strengths:  . Family support . Communicates needs to spouse or family members . Self Advocates as needed  Patient Self Care Deficits:  . Anxiety or Stress Challenges  Patient Goals:  - spend time or talk with others every day - practice relaxation or meditation daily - keep a calendar with appointment dates  Follow Up Plan: LCSW to call client on 01/17/21      .          Norva Riffle.Annessa Satre MSW, LCSW Licensed Clinical Social Worker [facility] [phone]

## 2020-12-05 NOTE — Chronic Care Management (AMB) (Signed)
Chronic Care Management    Clinical Social Work Note  12/05/2020 Name: Haley Trevino MRN: [id number] DOB: [birth year]  Haley Trevino is a 80 y.o. year old female who is a primary care patient of [name], [place]. The CCM team was consulted to assist the patient with chronic disease management and/or care coordination needs related to: [name] .   Engaged with patient by telephone for follow up visit in response to provider referral for social work chronic care management and care coordination services.   Consent to Services:  The patient was given information about Chronic Care Management services, agreed to services, and gave verbal consent prior to initiation of services.  Please see initial visit note for detailed documentation.   Patient agreed to services and consent obtained.   Assessment: Review of patient past medical history, allergies, medications, and health status, including review of relevant consultants reports was performed today as part of a comprehensive evaluation and provision of chronic care management and care coordination services.     SDOH (Social Determinants of Health) assessments and interventions performed:  SDOH Interventions   Flowsheet Row Most Recent Value  SDOH Interventions   Depression Interventions/Treatment  --  [informed client and her spouse of LCSW support and of RNCM support]       Advanced Directives Status: See [employer] application for related entries.  CCM Care Plan  Allergies  Allergen Reactions  . Dilaudid [Hydromorphone Hcl] Anaphylaxis  . Dilaudid [Hydromorphone] Anaphylaxis  . Acyclovir And Related     Unknown reaction  . Crestor [Rosuvastatin Calcium] Other (See Comments)    Muscle pain  . Gabapentin Swelling    Swelling in feet, and in legs  . Aleve [Naproxen Sodium] Rash  . Lyrica [Pregabalin] Other (See Comments)    Swelling in feet, and in legs    Outpatient Encounter Medications as of 12/05/2020   Medication Sig  . acetaminophen (TYLENOL) 500 MG tablet Take 1,000 mg by mouth every 6 (six) hours as needed for moderate pain or headache.  . Cholecalciferol (VITAMIN D) 50 MCG (2000 UT) tablet Take 2,000 Units by mouth daily.  . clonazePAM (KLONOPIN) 0.5 MG tablet Take 1 tablet (0.5 mg total) by mouth 2 (two) times daily as needed for anxiety.  Marland Kitchen ezetimibe (ZETIA) 10 MG tablet Take 1 tablet (10 mg total) by mouth daily.  . Fe Fum-FA-B Cmp-C-Zn-Mg-Mn-Cu (HEMOCYTE PLUS) 106-1 MG CAPS Take 1 capsule by mouth daily.  Marland Kitchen FLUoxetine (PROZAC) 40 MG capsule Take 1 capsule (40 mg total) by mouth daily.  Marland Kitchen HYDROcodone-acetaminophen (NORCO/VICODIN) 5-325 MG tablet Take 1 tablet by mouth daily as needed for moderate pain. (Patient taking differently: Take 1 tablet by mouth at bedtime as needed for moderate pain.)  . lisinopril (ZESTRIL) 40 MG tablet Take 1 tablet (40 mg total) by mouth daily.  Marland Kitchen NIFEdipine (PROCARDIA XL/NIFEDICAL XL) 60 MG 24 hr tablet Take 1 tablet (60 mg total) by mouth daily.  Marland Kitchen omeprazole (PRILOSEC) 20 MG capsule TAKE 1 TABLET DAILY  . PROLIA 60 MG/ML SOSY injection Inject 60 mg into the skin every 6 (six) months.  . sildenafil (REVATIO) 20 MG tablet Take 1 tablet (20 mg total) by mouth 2 (two) times daily.  Marland Kitchen SYNTHROID 75 MCG tablet Take 1 tablet (75 mcg total) by mouth daily before breakfast.   No facility-administered encounter medications on file as of 12/05/2020.    Patient Active Problem List   Diagnosis Date Noted  . History of adenomatous polyp of colon  09/21/2020  . Weight loss 09/21/2020  . Oral phase dysphagia 09/21/2020  . Noninfective gastroenteritis and colitis, unspecified 09/21/2020  . Irritable bowel syndrome 09/21/2020  . Breast implant rupture 09/04/2020  . Iron deficiency anemia due to chronic blood loss 08/28/2020  . Arthralgia 07/16/2020  . Pain in right shoulder 04/11/2020  . Murmur 11/18/2017  . Impingement syndrome of left shoulder region 12/08/2014   . Osteoporosis 09/20/2014  . Hip fracture requiring operative repair ([place]) 02/22/2014  . Hypothyroidism 11/29/2013  . Depression 11/29/2013  . GERD (gastroesophageal reflux disease) 11/29/2013  . Hypertension 11/29/2013  . Hyperlipidemia with target LDL less than 100 11/29/2013  . Thyroid cyst 04/26/2013  . MALIGNANT MELANOMA, SKIN 08/07/2010  . [name] 08/06/2010  . SCLERODERMA 08/06/2010  . Osteoarthritis 08/06/2010    Conditions to be addressed/monitored: Monitor client management of stress and anxiety issues; monitor client management of health needs she faces   Care Plan : LCSW Care Plan  Updates made by Katha Cabal, LCSW since 12/05/2020 12:00 AM    Problem: Anxiety Identification (Anxiety)     Goal: Anxiety Symptoms Identified;Manage anxiety symptoms   Start Date: 12/05/2020  Expected End Date: 03/05/2021  This Visit's Progress: On track  Recent Progress: On track  Priority: Medium  Note:   Current Barriers:  . Anxiety issues . Suicidal Ideation/Homicidal Ideation: No  Clinical Social Work Goal(s):  . patient will work with SW monthly by telephone or in person to reduce or manage symptoms related to anxiety or stress issues faced . Attend scheduled medical appointments in next 30 days  Interventions: . 1:1 collaboration with [name], FNP regarding development and update of comprehensive plan of care as evidenced by provider attestation and co-signature . Talked with client about her management of anxiety or stress issues faced . Talked with client about her recent iron infusion treatment (client said she has now had 5 iron infusion treatments) . Talked with client about energy level of client (she said she has been feeling weak recently) . Spoke with client about recent endoscopy . Talked with client about support from Gastroenterologist . Talked with client about pain issues of client . Talked with Charlett Nose about her upcoming medical  appointments . Provided counseling support for client . Talked with client about ambulation of client (she said she gets dizzy sometimes) . Talked with client about mood of client (she said she gets depressed occasionally) . Client spoke of being fatigued, too weak to do some activities . Talked with client about relaxation techniques (likes to read,likes to talk with her daughter via phone) . Encouraged client to call RNCM as needed for nursing support . Talked with client about difficulty standing . Talked with client about health of her spouse . Collaborated with RNCM about current needs of client  Patient Self Care Activities:  . Completes ADLs daily . Takes Medications as prescribed  Patient Coping Strengths:  . Family support . Communicates needs to spouse or family members . Self Advocates as needed  Patient Self Care Deficits:  . Anxiety or Stress Challenges  Patient Goals:  - spend time or talk with others every day - practice relaxation or meditation daily - keep a calendar with appointment dates  Follow Up Plan: LCSW to call client on 01/17/21     Norva Riffle.Felise Georgia MSW, LCSW Licensed Clinical Social Worker [facility] Care Management [phone]

## 2020-12-10 ENCOUNTER — Encounter (HOSPITAL_COMMUNITY): Payer: Self-pay | Admitting: Emergency Medicine

## 2020-12-10 ENCOUNTER — Other Ambulatory Visit: Payer: Self-pay

## 2020-12-10 ENCOUNTER — Ambulatory Visit (INDEPENDENT_AMBULATORY_CARE_PROVIDER_SITE_OTHER): Payer: Medicare Other | Admitting: Nurse Practitioner

## 2020-12-10 ENCOUNTER — Encounter: Payer: Self-pay | Admitting: Nurse Practitioner

## 2020-12-10 ENCOUNTER — Emergency Department (HOSPITAL_COMMUNITY)
Admission: EM | Admit: 2020-12-10 | Discharge: 2020-12-10 | Disposition: A | Discharge status: Home / Self Care | Payer: Medicare Other | Attending: Emergency Medicine | Admitting: Emergency Medicine

## 2020-12-10 VITALS — BP 126/64 | HR 80 | Temp 98.2°F | Resp 20 | Ht 62.0 in | Wt 107.0 lb

## 2020-12-10 DIAGNOSIS — D649 Anemia, unspecified: Secondary | ICD-10-CM | POA: Diagnosis not present

## 2020-12-10 DIAGNOSIS — Z85828 Personal history of other malignant neoplasm of skin: Secondary | ICD-10-CM | POA: Insufficient documentation

## 2020-12-10 DIAGNOSIS — D500 Iron deficiency anemia secondary to blood loss (chronic): Secondary | ICD-10-CM

## 2020-12-10 DIAGNOSIS — R5383 Other fatigue: Secondary | ICD-10-CM | POA: Diagnosis present

## 2020-12-10 DIAGNOSIS — I10 Essential (primary) hypertension: Secondary | ICD-10-CM | POA: Diagnosis not present

## 2020-12-10 DIAGNOSIS — E860 Dehydration: Secondary | ICD-10-CM | POA: Insufficient documentation

## 2020-12-10 DIAGNOSIS — Z79899 Other long term (current) drug therapy: Secondary | ICD-10-CM | POA: Insufficient documentation

## 2020-12-10 DIAGNOSIS — Z7722 Contact with and (suspected) exposure to environmental tobacco smoke (acute) (chronic): Secondary | ICD-10-CM | POA: Insufficient documentation

## 2020-12-10 DIAGNOSIS — E039 Hypothyroidism, unspecified: Secondary | ICD-10-CM | POA: Insufficient documentation

## 2020-12-10 LAB — CBC
HCT: 26.9 % — ABNORMAL LOW (ref 36.0–46.0)
Hemoglobin: 8.6 g/dL — ABNORMAL LOW (ref 12.0–15.0)
MCH: 28.7 pg (ref 26.0–34.0)
MCHC: 32 g/dL (ref 30.0–36.0)
MCV: 89.7 fL (ref 80.0–100.0)
Platelets: 488 10*3/uL — ABNORMAL HIGH (ref 150–400)
RBC: 3 MIL/uL — ABNORMAL LOW (ref 3.87–5.11)
RDW: 15.3 % (ref 11.5–15.5)
WBC: 10.9 10*3/uL — ABNORMAL HIGH (ref 4.0–10.5)
nRBC: 0 % (ref 0.0–0.2)

## 2020-12-10 LAB — COMPREHENSIVE METABOLIC PANEL
ALT: 12 U/L (ref 0–44)
AST: 17 U/L (ref 15–41)
Albumin: 4.2 g/dL (ref 3.5–5.0)
Alkaline Phosphatase: 38 U/L (ref 38–126)
Anion gap: 10 (ref 5–15)
BUN: 26 mg/dL — ABNORMAL HIGH (ref 8–23)
CO2: 19 mmol/L — ABNORMAL LOW (ref 22–32)
Calcium: 8.9 mg/dL (ref 8.9–10.3)
Chloride: 104 mmol/L (ref 98–111)
Creatinine, Ser: 1.11 mg/dL — ABNORMAL HIGH (ref 0.44–1.00)
GFR, Estimated: 51 mL/min — ABNORMAL LOW (ref >60)
Glucose, Bld: 112 mg/dL — ABNORMAL HIGH (ref 70–99)
Potassium: 4.2 mmol/L (ref 3.5–5.1)
Sodium: 133 mmol/L — ABNORMAL LOW (ref 135–145)
Total Bilirubin: 0.3 mg/dL (ref 0.3–1.2)
Total Protein: 7.6 g/dL (ref 6.5–8.1)

## 2020-12-10 LAB — TROPONIN I (HIGH SENSITIVITY): Troponin I (High Sensitivity): 8 ng/L (ref <18)

## 2020-12-10 LAB — TYPE AND SCREEN
ABO/RH(D): O POS
Antibody Screen: NEGATIVE

## 2020-12-10 LAB — HEMOGLOBIN, FINGERSTICK: Hemoglobin: 6.7 g/dL — CL (ref 11.1–15.9)

## 2020-12-10 LAB — POC OCCULT BLOOD, ED: Fecal Occult Bld: NEGATIVE

## 2020-12-10 MED ORDER — SODIUM CHLORIDE 0.9 % IV BOLUS
1000.0000 mL | Freq: Once | INTRAVENOUS | Status: AC
  Administered 2020-12-10: 1000 mL via INTRAVENOUS

## 2020-12-10 NOTE — Progress Notes (Signed)
   Subjective:    Patient ID: Haley Trevino, female    DOB: 1940/08/20, 80 y.o.   MRN: [id number]   Chief Complaint: Blood pressure low, Shaking (/), and Weakness   HPI Patient comes in today of fatigue and low blood pressure. Says she feels terrible.   Review of Systems  Constitutional: Positive for fatigue.  Cardiovascular: Negative for chest pain, palpitations and leg swelling.  Musculoskeletal: Negative.   Neurological: Positive for weakness.  All other systems reviewed and are negative.      Objective:   Physical Exam Vitals and nursing note reviewed.  Constitutional:      Appearance: Normal appearance.  Cardiovascular:     Rate and Rhythm: Normal rate and regular rhythm.     Heart sounds: Normal heart sounds.  Skin:    General: Skin is warm.     Coloration: Skin is pale.  Neurological:     General: No focal deficit present.     Mental Status: She is alert and oriented to person, place, and time.     Comments: shaky  Psychiatric:        Mood and Affect: Mood normal.        Behavior: Behavior normal.   BP 126/64   Pulse 80   Temp 98.2 F (36.8 C) (Temporal)   Resp 20   Ht 5\' 2"  (1.575 m)   Wt 107 lb (48.5 kg)   SpO2 96%   BMI 19.57 kg/m   hemoglobin 6.7        Assessment & Plan:  [name] in today with chief complaint of Blood pressure low, Shaking (/), and Weakness   1. Iron deficiency anemia due to chronic blood loss TO ED right now - Hemoglobin, fingerstick  * will address blood pressure after her hgb has improved.  The above assessment and management plan was discussed with the patient. The patient verbalized understanding of and has agreed to the management plan. Patient is aware to call the clinic if symptoms persist or worsen. Patient is aware when to return to the clinic for a follow-up visit. Patient educated on when it is appropriate to go to the emergency department.   Mary-Margaret Hassell Done, FNP

## 2020-12-10 NOTE — ED Provider Notes (Signed)
[facility] Provider Note   CSN: [id number] Arrival date & time: 12/10/20  1723     History No chief complaint on file.   Haley Trevino is a 80 y.o. female.  HPI   Patient with significant medical history of cataracts, depression, GERD, IBS, scleroderma Raynaud's, normocytic anemia presents with chief complaint of abnormal lab work.  Patient states she was seen at her oncologist where they obtained labs and saw that she had a hemoglobin of 6.7.  She was sent to the emergency department for further work-up.  Patient states over last week she has felt more fatigued, states she gets dyspnea on exertion but denies experiencing chest pain, feeling lightheaded, dizziness, nausea or vomiting.  She has no cardiac history, history of PEs or DVTs, currently not on hormone therapy.  Patient states she has had anemia in the past, she had a endoscopy which was unremarkable, she has been worked up at her oncologist for normocytic anemia which they feel is secondary due chronic blood loss.  She is receiving iron infusion and is on iron supplements.  She endorses dark stools but denies hematochezia or melena, she denies nosebleeds, hematemesis, abnormal bleeding.  She denies any alleviating factors.  Patient denies headaches, fevers, chills, shortness of breath or chest pain.  Past Medical History:  Diagnosis Date  . Cataract   . Depression   . Dyspnea   . GERD (gastroesophageal reflux disease)   . Hyperlipidemia   . Hypertension   . Hypothyroidism   . IBS (irritable bowel syndrome)   . Osteoarthritis   . Raynaud disease   . Scleroderma ([place])   . Thyroid disease    hypothyroidism    Patient Active Problem List   Diagnosis Date Noted  . History of adenomatous polyp of colon 09/21/2020  . Weight loss 09/21/2020  . Oral phase dysphagia 09/21/2020  . Noninfective gastroenteritis and colitis, unspecified 09/21/2020  . Irritable bowel syndrome 09/21/2020  . Breast implant  rupture 09/04/2020  . Iron deficiency anemia due to chronic blood loss 08/28/2020  . Arthralgia 07/16/2020  . Pain in right shoulder 04/11/2020  . Murmur 11/18/2017  . Impingement syndrome of left shoulder region 12/08/2014  . Osteoporosis 09/20/2014  . Hip fracture requiring operative repair ([place]) 02/22/2014  . Hypothyroidism 11/29/2013  . Depression 11/29/2013  . GERD (gastroesophageal reflux disease) 11/29/2013  . Hypertension 11/29/2013  . Hyperlipidemia with target LDL less than 100 11/29/2013  . Thyroid cyst 04/26/2013  . MALIGNANT MELANOMA, SKIN 08/07/2010  . [name] 08/06/2010  . SCLERODERMA 08/06/2010  . Osteoarthritis 08/06/2010    Past Surgical History:  Procedure Laterality Date  . BREAST ENHANCEMENT SURGERY  1975  . BREAST IMPLANT REMOVAL Bilateral 04/23/2016   Procedure: REMOVALBILATERAL BREAST IMPLANTS;  Surgeon: Wallace Going, DO;  Location: [facility];  Service: Plastics;  Laterality: Bilateral;  . CHOLECYSTECTOMY  1973  . ESOPHAGOGASTRODUODENOSCOPY N/A 10/25/2020   Procedure: ESOPHAGOGASTRODUODENOSCOPY (EGD);  Surgeon: Clarene Essex, MD;  Location: [facility];  Service: Endoscopy;  Laterality: N/A;  enteroscopy  . HAND SURGERY  08/2012; 11/2012  . HOT HEMOSTASIS N/A 10/25/2020   Procedure: HOT HEMOSTASIS (ARGON PLASMA COAGULATION/BICAP);  Surgeon: Clarene Essex, MD;  Location: [facility];  Service: Endoscopy;  Laterality: N/A;  . IR RADIOLOGIST EVAL & MGMT  07/27/2017  . IR SACROPLASTY BILATERAL  07/31/2017  . IR VERTEBROPLASTY CERV/THOR BX INC UNI/BIL INC/INJECT/IMAGING  03/13/2020  . IR VERTEBROPLASTY EA ADDL (T&LS) BX INC UNI/BIL INC INJECT/IMAGING  03/14/2020  .  MELANOMA EXCISION     at 35 yrs of age  . ORIF HIP FRACTURE Right 02/22/2014   Procedure: OPEN REDUCTION INTERNAL FIXATION RIGHT HIP;  Surgeon: Sanjuana Kava, MD;  Location: [facility];  Service: Orthopedics;  Laterality: Right;  . TOTAL ABDOMINAL HYSTERECTOMY  1974     OB  History    Gravida  3   Para  2   Term      Preterm      AB  1   Living  1     SAB  1   IAB      Ectopic      Multiple      Live Births              Family History  Problem Relation Age of Onset  . Pancreatic cancer Father   . Raynaud syndrome Father   . Cancer Father        pancreatic  . Rashes / Skin problems Daughter        possibly scleraderma  . Hip fracture Mother   . Mental illness Mother        attempted suicide at 93 yo    Social History   Tobacco Use  . Smoking status: Never Smoker  . Smokeless tobacco: Never Used  . Tobacco comment: passive tobacco smoke exposure as child  Vaping Use  . Vaping Use: Never used  Substance Use Topics  . Alcohol use: No  . Drug use: No    Home Medications Prior to Admission medications   Medication Sig Start Date End Date Taking? Authorizing Provider  sildenafil (REVATIO) 20 MG tablet Take 1 tablet (20 mg total) by mouth 2 (two) times daily. 11/12/20  Yes Hassell Done, Mary-Margaret, FNP  acetaminophen (TYLENOL) 500 MG tablet Take 1,000 mg by mouth every 6 (six) hours as needed for moderate pain or headache.    [provider]  CALCIUM PO Take by mouth.    [provider]  Cholecalciferol (VITAMIN D) 50 MCG (2000 UT) tablet Take 2,000 Units by mouth daily.    [provider]  clonazePAM (KLONOPIN) 0.5 MG tablet Take 1 tablet (0.5 mg total) by mouth 2 (two) times daily as needed for anxiety. 11/12/20   Hassell Done, Mary-Margaret, FNP  ezetimibe (ZETIA) 10 MG tablet Take 1 tablet (10 mg total) by mouth daily. 11/12/20   Hassell Done, Mary-Margaret, FNP  Fe Fum-FA-B Cmp-C-Zn-Mg-Mn-Cu (HEMOCYTE PLUS) 106-1 MG CAPS Take 1 capsule by mouth daily. 08/20/20   Hassell Done, Mary-Margaret, FNP  FLUoxetine (PROZAC) 40 MG capsule Take 1 capsule (40 mg total) by mouth daily. 11/12/20   Hassell Done Mary-Margaret, FNP  HYDROcodone-acetaminophen (NORCO/VICODIN) 5-325 MG tablet Take 1 tablet by mouth daily as needed for moderate  pain. Patient taking differently: Take 1 tablet by mouth at bedtime as needed for moderate pain. 08/16/20   Hassell Done, Mary-Margaret, FNP  lisinopril (ZESTRIL) 40 MG tablet Take 1 tablet (40 mg total) by mouth daily. Patient not taking: Reported on 12/10/2020 11/12/20   [name], FNP  NIFEdipine (PROCARDIA XL/NIFEDICAL XL) 60 MG 24 hr tablet Take 1 tablet (60 mg total) by mouth daily. 11/12/20   Hassell Done Mary-Margaret, FNP  omeprazole (PRILOSEC) 20 MG capsule TAKE 1 TABLET DAILY 11/12/20   Hassell Done, Mary-Margaret, FNP  PROLIA 60 MG/ML SOSY injection Inject 60 mg into the skin every 6 (six) months. 09/07/20   [name], FNP  SYNTHROID 75 MCG tablet Take 1 tablet (75 mcg total) by mouth daily before breakfast. 11/12/20  Hassell Done, Mary-Margaret, FNP    Allergies    Dilaudid [hydromorphone hcl], Dilaudid [hydromorphone], Acyclovir and related, Crestor [rosuvastatin calcium], Gabapentin, Aleve [naproxen sodium], and Lyrica [pregabalin]  Review of Systems   Review of Systems  Constitutional: Positive for fatigue. Negative for chills and fever.  HENT: Negative for congestion.   Respiratory: Positive for shortness of breath.   Cardiovascular: Negative for chest pain.  Gastrointestinal: Negative for abdominal pain, diarrhea, nausea and vomiting.  Genitourinary: Negative for enuresis.  Musculoskeletal: Negative for back pain.  Skin: Negative for rash.  Neurological: Negative for dizziness and headaches.  Hematological: Does not bruise/bleed easily.    Physical Exam Updated Vital Signs BP (!) 145/61 (BP Location: Right Arm)   Pulse 97   Temp 98.6 F (37 C)   Resp 17   SpO2 99%   Physical Exam Vitals and nursing note reviewed. Exam conducted with a chaperone present.  Constitutional:      General: She is not in acute distress.    Appearance: She is not ill-appearing.  HENT:     Head: Normocephalic and atraumatic.     Nose: No congestion.     Mouth/Throat:     Mouth: Mucous  membranes are moist.     Pharynx: Oropharynx is clear. No oropharyngeal exudate or posterior oropharyngeal erythema.  Eyes:     Conjunctiva/sclera: Conjunctivae normal.  Cardiovascular:     Rate and Rhythm: Normal rate and regular rhythm.     Pulses: Normal pulses.     Heart sounds: No murmur heard. No friction rub. No gallop.   Pulmonary:     Effort: No respiratory distress.     Breath sounds: No wheezing, rhonchi or rales.  Abdominal:     Palpations: Abdomen is soft.     Tenderness: There is no abdominal tenderness.  Genitourinary:    Rectum: Guaiac result negative.     Comments: With chaperone presents rectal exam was performed patient noted external hemorrhoids, there is no gross abnormalities noted felt within the rectum, there is no melena, bright red blood or dark tarry stools present on exam. Musculoskeletal:     Right lower leg: No edema.     Left lower leg: No edema.  Skin:    General: Skin is warm and dry.  Neurological:     Mental Status: She is alert.  Psychiatric:        Mood and Affect: Mood normal.     ED Results / Procedures / Treatments   Labs (all labs ordered are listed, but only abnormal results are displayed) Labs Reviewed  COMPREHENSIVE METABOLIC PANEL - Abnormal; Notable for the following components:      Result Value   Sodium 133 (*)    CO2 19 (*)    Glucose, Bld 112 (*)    BUN 26 (*)    Creatinine, Ser 1.11 (*)    GFR, Estimated 51 (*)    All other components within normal limits  CBC - Abnormal; Notable for the following components:   WBC 10.9 (*)    RBC 3.00 (*)    Hemoglobin 8.6 (*)    HCT 26.9 (*)    Platelets 488 (*)    All other components within normal limits  POC OCCULT BLOOD, ED  TYPE AND SCREEN  TROPONIN I (HIGH SENSITIVITY)    EKG None  Radiology No results found.  Procedures Procedures   Medications Ordered in ED Medications  sodium chloride 0.9 % bolus 1,000 mL (1,000 mLs Intravenous New Bag/Given 12/10/20  2140)     ED Course  I have reviewed the triage vital signs and the nursing notes.  Pertinent labs & imaging results that were available during my care of the patient were reviewed by me and considered in my medical decision making (see chart for details).    MDM Rules/Calculators/A&P                         Initial impression-patient presents with concerns of abnormal lab work.  She is alert, does not appear in acute distress, vital signs reassuring.  Will obtain basic lab work-up and reassess.  Work-up-CBC shows slight leukocytosis of 10.9, normocytic anemia with a hemoglobin 8.6 appears to be around baseline for patient.  CMP shows slight hyponatremia of 133, decreased CO2 of 19, elevated elevated BUN of 26, creatinine elevated 1.11, per troponin [place], Hemoccult negative.  EKG sinus without signs of ischemia. negative orthostatics  Reassessment-patient was updated on lab work, patient has no complaints at this time, vital signs remained stable, patient is agreeable for discharge.   Rule out-I have low suspicion for lower GI bleed as patient is Hemoccult negative, she has negative orthostatics, vital signs remained stable.  Patient does have a decrease in her hemoglobin but this appears to be at her baseline.  Low suspicion patient will need emergent blood transfusion as vital signs reassuring, patient had negative orthostatics.  Low suspicion for perforated stomach ulcer as abdomen is nondistended, normative bowel sounds, no peritoneal sign present my exam.  Low suspicion for liver or gallbladder normality as patient has no right upper quadrant pain, liver signs and alk phos are within normal limits.  low suspicion for systemic infection patient is nontoxic-appearing, vital signs reassuring.  Low suspicion for ACS as EKG is without signs of ischemia, first troponin is 8, will defer second troponin as chest pain has been present for greater than 12 hours, would expect elevation if ACS was present.   Patient does have slight leukocytosis suspect this more acute phase reactant.  Of note patient has decreased CO2 with an elevated creatinine I suspect this secondary due to dehydration, patient was provided with a liter of fluid however still stay at home.  Plan-  1.  Abnormal lab work-patient's hemoglobin is slightly low but this appears to be about her baseline, we will have her follow-up with her oncologist for further evaluation.  Vital signs have remained stable, no indication for hospital admission.  Patient discussed with attending and they agreed with assessment and plan.  Patient given at home care as well strict return precautions.  Patient verbalized that they understood agreed to said plan.   Final Clinical Impression(s) / ED Diagnoses Final diagnoses:  Normocytic anemia  Dehydration    Rx / DC Orders ED Discharge Orders    None       Marcello Fennel, PA-C 12/10/20 2246    Carmin Muskrat, MD 12/10/20 2318

## 2020-12-10 NOTE — ED Triage Notes (Signed)
Pt sent by [facility] for a hemoglobin of 6.7.  Pt states she is going to pass out. Pt states her stool is black and tarry. Pt states it gets better when she stops taking her iron pill.

## 2020-12-10 NOTE — Discharge Instructions (Addendum)
Lab work reveals your hemoglobin is at its baseline, recommend continuing with all home medication as prescribed.  You were  slightly dehydrated here please continue to hydrate at home.  Please follow-up with your oncologist for further management of your anemia.  Come back to the emergency department if you develop chest pain, shortness of breath, severe abdominal pain, uncontrolled nausea, vomiting, diarrhea.

## 2020-12-10 NOTE — ED Notes (Signed)
Pt urine at bedside

## 2020-12-10 NOTE — ED Notes (Signed)
Pt ambulated around ED unit. O2 stayed between 94% - 96%

## 2020-12-11 ENCOUNTER — Other Ambulatory Visit (HOSPITAL_COMMUNITY): Payer: Self-pay | Admitting: Physician Assistant

## 2020-12-11 DIAGNOSIS — D500 Iron deficiency anemia secondary to blood loss (chronic): Secondary | ICD-10-CM

## 2020-12-11 NOTE — Progress Notes (Signed)
Virtual Visit via Telephone Note [facility]  I connected with Haley Trevino on 12/12/20 at 11:42 AM by telephone and verified that I am speaking with the correct person using two identifiers.  Location: Patient: Home Provider: [facility]   I discussed the limitations, risks, security and privacy concerns of performing an evaluation and management service by telephone and the availability of in person appointments. I also discussed with the patient that there may be a patient responsible charge related to this service. The patient expressed understanding and agreed to proceed.   History of Present Illness: [name] is contacted today for follow-up of her iron deficiency anemia.  She received IV Venofer x5 (last dose 10/05/2020).  She was last seen in clinic by Tarri Abernethy PA-C on 10/23/2020.  She has been suspected to have possible chronic occult GI blood loss due to reported melena.  Since her last visit, she had EGD performed by gastroenterology (10/25/2020) which did show 3 small angiodysplastic lesions without bleeding in the duodenum and jejunum, fulguration to ablate lesion was successful with argon plasma.  She presented to the ED on 12/10/2020, after her PCP checked POC fingerstick Hgb 6.7.  Repeat Hgb checked in ED was 8.6 with MCV 89.7.  However, her Hgb is decreased from Hgb 12.0 on 11/12/2020.  Hemoccult blood was negative x1 when checked in ED.    At today's visit, she reports severe fatigue, with current energy level about 25%, states that she feels exhausted after simple ADLs.  She continues to have consistent melena, reports that her bowel movements are "black and shiny," but she is also continuing to take oral iron supplementation.  In addition to severe fatigue, she also notes dyspnea on exertion, lightheadedness, dizziness, and hypotension at home.  She reports that she had blood pressure as low as 84/27 when she checked it last week, but  reports that she was also told that she was dehydrated in the ED.  She stopped taking her lisinopril, but has continued to take her nifedipine.  Blood pressures today were about 100/70 per patient report.  Labs checked today (12/12/2020) show worsening normocytic anemia with Hgb 7.0 / MCV 93.5.  Iron deficiency evident with ferritin 13 and iron saturation 6%.    Observations/Objective: Review of Systems  Constitutional: Positive for malaise/fatigue. Negative for chills, diaphoresis, fever and weight loss.  Respiratory: Positive for shortness of breath (with exertion). Negative for cough and hemoptysis.   Cardiovascular: Negative for chest pain and palpitations.  Gastrointestinal: Positive for constipation, diarrhea and melena. Negative for abdominal pain, nausea and vomiting.  Musculoskeletal: Positive for back pain and joint pain.  Neurological: Positive for dizziness, tingling (chronic secondary to [name]), weakness (gneralized weakness) and headaches.  Psychiatric/Behavioral: The patient is nervous/anxious and has insomnia.      PHYSICAL EXAM (per limitations of virtual telephone visit): The patient is alert and oriented x 3, exhibiting adequate mentation, good mood, and ability to speak in full sentences and execute sound judgement.   ASSESSMENT: 1.  Iron deficiency anemia - Labs obtained 09/21/2020 significant for normocytic anemia (Hgb 10.2, MCV 83.0) and iron deficiency (ferritin 13) - Received IV Venofer x 5 (last dose 10/05/2020) - Went to ED on 6 6 due to Hgb 6.7 when checked by PCP, although repeat CBC at ED was found to be 8.6 - Labs today (12/11/2020) show worsening normocytic anemia with Hgb 7.0 / MCV 93.5.  Iron deficiency evident with ferritin 13 and iron saturation  6%. - Suspect chronic blood loss in the setting of melena with recently discovered angiodysplastic lesions on EGD (treated with APC), although black stools may also be attributable to oral iron  supplementation - May also be an aspect of malabsorption in the setting of her scleroderma and low meat intake, as well as use of PPI  PLAN:  1.  Iron deficiency anemia - Schedule patient for PRBC x1 tomorrow - Transfuse IV iron (Venofer x3 - 300 mg / 300 mg / 400 mg) - Patient instructed to stop taking oral iron, as it is clearly not effective and will further muddle the true caution of melena - Labs (01/16/2021) and in person follow-up visit (01/24/2021) have already been scheduled  2.  Hypotension - Patient notes hypotension, has been holding her lisinopril at home - Instructed patient to also hold her nifedipine if she has BP <90/60 - Encouraged her to drink plenty of water - Instructed patient to present to the ED if she has blood pressure less than 90/60 or if severely symptomatic - Likely multifactorial related to dehydration as well as acute anemia - We will schedule for RBC transfusion as above   Follow Up Instructions: - Schedule patient for PRBC x1 tomorrow - Transfuse IV iron (Venofer x3 - 300 mg / 300 mg / 400 mg) - Patient instructed to stop taking oral iron, as it is clearly not effective and will further muddle the true caution of melena - Labs (01/16/2021) and in person follow-up visit (01/24/2021) have already been scheduled   I discussed the assessment and treatment plan with the patient. The patient was provided an opportunity to ask questions and all were answered. The patient agreed with the plan and demonstrated an understanding of the instructions.   The patient was advised to call back or seek an in-person evaluation if the symptoms worsen or if the condition fails to improve as anticipated.  I provided 22 minutes of non-face-to-face time during this encounter.  Harriett Rush, PA-C 12/12/20 12:16 PM

## 2020-12-12 ENCOUNTER — Encounter (HOSPITAL_COMMUNITY): Payer: Self-pay | Admitting: Physician Assistant

## 2020-12-12 ENCOUNTER — Inpatient Hospital Stay (HOSPITAL_BASED_OUTPATIENT_CLINIC_OR_DEPARTMENT_OTHER): Payer: Medicare Other | Admitting: Physician Assistant

## 2020-12-12 ENCOUNTER — Inpatient Hospital Stay (HOSPITAL_COMMUNITY): Payer: Medicare Other | Attending: Hematology

## 2020-12-12 ENCOUNTER — Other Ambulatory Visit (HOSPITAL_COMMUNITY): Payer: Self-pay | Admitting: Physician Assistant

## 2020-12-12 ENCOUNTER — Other Ambulatory Visit: Payer: Self-pay

## 2020-12-12 DIAGNOSIS — I959 Hypotension, unspecified: Secondary | ICD-10-CM | POA: Diagnosis not present

## 2020-12-12 DIAGNOSIS — D500 Iron deficiency anemia secondary to blood loss (chronic): Secondary | ICD-10-CM | POA: Diagnosis not present

## 2020-12-12 DIAGNOSIS — D509 Iron deficiency anemia, unspecified: Secondary | ICD-10-CM | POA: Insufficient documentation

## 2020-12-12 DIAGNOSIS — D649 Anemia, unspecified: Secondary | ICD-10-CM

## 2020-12-12 LAB — CBC WITH DIFFERENTIAL/PLATELET
Abs Immature Granulocytes: 0.02 10*3/uL (ref 0.00–0.07)
Basophils Absolute: 0 10*3/uL (ref 0.0–0.1)
Basophils Relative: 1 %
Eosinophils Absolute: 0.2 10*3/uL (ref 0.0–0.5)
Eosinophils Relative: 3 %
HCT: 22.9 % — ABNORMAL LOW (ref 36.0–46.0)
Hemoglobin: 7 g/dL — ABNORMAL LOW (ref 12.0–15.0)
Immature Granulocytes: 0 %
Lymphocytes Relative: 22 %
Lymphs Abs: 1.3 10*3/uL (ref 0.7–4.0)
MCH: 28.6 pg (ref 26.0–34.0)
MCHC: 30.6 g/dL (ref 30.0–36.0)
MCV: 93.5 fL (ref 80.0–100.0)
Monocytes Absolute: 0.4 10*3/uL (ref 0.1–1.0)
Monocytes Relative: 6 %
Neutro Abs: 4.1 10*3/uL (ref 1.7–7.7)
Neutrophils Relative %: 68 %
Platelets: 383 10*3/uL (ref 150–400)
RBC: 2.45 MIL/uL — ABNORMAL LOW (ref 3.87–5.11)
RDW: 15.7 % — ABNORMAL HIGH (ref 11.5–15.5)
WBC: 6 10*3/uL (ref 4.0–10.5)
nRBC: 0 % (ref 0.0–0.2)

## 2020-12-12 LAB — IRON AND TIBC
Iron: 21 ug/dL — ABNORMAL LOW (ref 28–170)
Saturation Ratios: 6 % — ABNORMAL LOW (ref 10.4–31.8)
TIBC: 381 ug/dL (ref 250–450)
UIBC: 360 ug/dL

## 2020-12-12 LAB — FERRITIN: Ferritin: 13 ng/mL (ref 11–307)

## 2020-12-13 ENCOUNTER — Inpatient Hospital Stay (HOSPITAL_COMMUNITY): Payer: Medicare Other

## 2020-12-13 DIAGNOSIS — D500 Iron deficiency anemia secondary to blood loss (chronic): Secondary | ICD-10-CM

## 2020-12-13 DIAGNOSIS — D509 Iron deficiency anemia, unspecified: Secondary | ICD-10-CM | POA: Diagnosis not present

## 2020-12-13 DIAGNOSIS — D649 Anemia, unspecified: Secondary | ICD-10-CM

## 2020-12-13 DIAGNOSIS — I959 Hypotension, unspecified: Secondary | ICD-10-CM | POA: Diagnosis not present

## 2020-12-13 LAB — PREPARE RBC (CROSSMATCH)

## 2020-12-14 ENCOUNTER — Other Ambulatory Visit: Payer: Self-pay

## 2020-12-14 ENCOUNTER — Encounter (HOSPITAL_COMMUNITY): Payer: Self-pay

## 2020-12-14 ENCOUNTER — Inpatient Hospital Stay (HOSPITAL_COMMUNITY): Payer: Medicare Other

## 2020-12-14 VITALS — BP 114/50 | HR 74 | Temp 98.1°F | Resp 18

## 2020-12-14 DIAGNOSIS — D649 Anemia, unspecified: Secondary | ICD-10-CM

## 2020-12-14 DIAGNOSIS — D500 Iron deficiency anemia secondary to blood loss (chronic): Secondary | ICD-10-CM

## 2020-12-14 DIAGNOSIS — I959 Hypotension, unspecified: Secondary | ICD-10-CM | POA: Diagnosis not present

## 2020-12-14 DIAGNOSIS — D509 Iron deficiency anemia, unspecified: Secondary | ICD-10-CM | POA: Diagnosis not present

## 2020-12-14 MED ORDER — SODIUM CHLORIDE 0.9 % IV SOLN
300.0000 mg | Freq: Once | INTRAVENOUS | Status: AC
  Administered 2020-12-14: 300 mg via INTRAVENOUS
  Filled 2020-12-14: qty 300

## 2020-12-14 MED ORDER — FAMOTIDINE 20 MG IN NS 100 ML IVPB
20.0000 mg | Freq: Once | INTRAVENOUS | Status: AC
Start: 2020-12-14 — End: 2020-12-14
  Administered 2020-12-14: 20 mg via INTRAVENOUS
  Filled 2020-12-14: qty 100

## 2020-12-14 MED ORDER — LANREOTIDE ACETATE 120 MG/0.5ML ~~LOC~~ SOLN
SUBCUTANEOUS | Status: AC
  Filled 2020-12-14: qty 120

## 2020-12-14 MED ORDER — DIPHENHYDRAMINE HCL 25 MG PO CAPS
25.0000 mg | ORAL_CAPSULE | Freq: Once | ORAL | Status: AC
Start: 2020-12-14 — End: 2020-12-14
  Administered 2020-12-14: 25 mg via ORAL
  Filled 2020-12-14: qty 1

## 2020-12-14 MED ORDER — ACETAMINOPHEN 325 MG PO TABS
650.0000 mg | ORAL_TABLET | Freq: Once | ORAL | Status: AC
  Administered 2020-12-14: 650 mg via ORAL
  Filled 2020-12-14: qty 2

## 2020-12-14 MED ORDER — LORATADINE 10 MG PO TABS
10.0000 mg | ORAL_TABLET | Freq: Once | ORAL | Status: AC
  Administered 2020-12-14: 10 mg via ORAL
  Filled 2020-12-14: qty 1

## 2020-12-14 MED ORDER — SODIUM CHLORIDE 0.9 % IV SOLN: Freq: Once | INTRAVENOUS | Status: AC

## 2020-12-14 MED ORDER — SODIUM CHLORIDE 0.9% IV SOLUTION
250.0000 mL | Freq: Once | INTRAVENOUS | Status: AC
  Administered 2020-12-14: 250 mL via INTRAVENOUS

## 2020-12-14 NOTE — Patient Instructions (Signed)
[facility]  Discharge Instructions: Thank you for choosing [facility] to provide your oncology and hematology care.  If you have a lab appointment with the [facility], please come in thru the Main Entrance and check in at the main information desk.  Wear comfortable clothing and clothing appropriate for easy access to any Portacath or PICC line.   We strive to give you quality time with your provider. You may need to reschedule your appointment if you arrive late (15 or more minutes).  Arriving late affects you and other patients whose appointments are after yours.  Also, if you miss three or more appointments without notifying the office, you may be dismissed from the clinic at the provider's discretion.      For prescription refill requests, have your pharmacy contact our office and allow 72 hours for refills to be completed.    Today you received the following: Venofer & 1 unit of blood.   To help prevent nausea and vomiting after your treatment, we encourage you to take your nausea medication as directed.  BELOW ARE SYMPTOMS THAT SHOULD BE REPORTED IMMEDIATELY: *FEVER GREATER THAN 100.4 F (38 C) OR HIGHER *CHILLS OR SWEATING *NAUSEA AND VOMITING THAT IS NOT CONTROLLED WITH YOUR NAUSEA MEDICATION *UNUSUAL SHORTNESS OF BREATH *UNUSUAL BRUISING OR BLEEDING *URINARY PROBLEMS (pain or burning when urinating, or frequent urination) *BOWEL PROBLEMS (unusual diarrhea, constipation, pain near the anus) TENDERNESS IN MOUTH AND THROAT WITH OR WITHOUT PRESENCE OF ULCERS (sore throat, sores in mouth, or a toothache) UNUSUAL RASH, SWELLING OR PAIN  UNUSUAL VAGINAL DISCHARGE OR ITCHING   Items with * indicate a potential emergency and should be followed up as soon as possible or go to the Emergency Department if any problems should occur.  Please show the CHEMOTHERAPY ALERT CARD or IMMUNOTHERAPY ALERT CARD at check-in to the Emergency Department and triage  nurse.  Should you have questions after your visit or need to cancel or reschedule your appointment, please contact [facility] [phone]  and follow the prompts.  Office hours are 8:00 a.m. to 4:30 p.m. Monday - Friday. Please note that voicemails left after 4:00 p.m. may not be returned until the following business day.  We are closed weekends and major holidays. You have access to a nurse at all times for urgent questions. Please call the main number to the clinic [phone] and follow the prompts.  For any non-urgent questions, you may also contact your provider using MyChart. We now offer e-Visits for anyone 74 and older to request care online for non-urgent symptoms. For details visit [email or web address].   Also download the MyChart app! Go to the app store, search "MyChart", open the app, select [facility], and log in with your MyChart username and password.  Due to Covid, a mask is required upon entering the hospital/clinic. If you do not have a mask, one will be given to you upon arrival. For doctor visits, patients may have 1 support person aged 37 or older with them. For treatment visits, patients cannot have anyone with them due to current Covid guidelines and our immunocompromised population.

## 2020-12-14 NOTE — Progress Notes (Signed)
Patient tolerated iron infusion with no complaints voiced. Peripheral IV site clean and dry with good blood return noted before and after infusion.   Patient tolerated blood transfusion with no complaints voiced. Peripheral IV site clean and dry with good blood return noted before and after infusion. Band aid applied. VSS with discharge and left in satisfactory condition with no s/s of distress noted.

## 2020-12-15 LAB — BPAM RBC
Blood Product Expiration Date: 202207142359
ISSUE DATE / TIME: 202206101142
Unit Type and Rh: 5100

## 2020-12-15 LAB — TYPE AND SCREEN
ABO/RH(D): O POS
Antibody Screen: NEGATIVE
Unit division: 0

## 2020-12-17 ENCOUNTER — Inpatient Hospital Stay (HOSPITAL_COMMUNITY): Payer: Medicare Other

## 2020-12-17 ENCOUNTER — Other Ambulatory Visit: Payer: Self-pay

## 2020-12-17 ENCOUNTER — Encounter (HOSPITAL_COMMUNITY): Payer: Self-pay

## 2020-12-17 VITALS — BP 116/55 | HR 72 | Temp 97.1°F | Resp 17

## 2020-12-17 DIAGNOSIS — D509 Iron deficiency anemia, unspecified: Secondary | ICD-10-CM | POA: Diagnosis not present

## 2020-12-17 DIAGNOSIS — D500 Iron deficiency anemia secondary to blood loss (chronic): Secondary | ICD-10-CM

## 2020-12-17 DIAGNOSIS — I959 Hypotension, unspecified: Secondary | ICD-10-CM | POA: Diagnosis not present

## 2020-12-17 MED ORDER — SODIUM CHLORIDE 0.9 % IV SOLN: Freq: Once | INTRAVENOUS | Status: AC

## 2020-12-17 MED ORDER — ACETAMINOPHEN 325 MG PO TABS
650.0000 mg | ORAL_TABLET | Freq: Once | ORAL | Status: AC
  Administered 2020-12-17: 650 mg via ORAL
  Filled 2020-12-17: qty 2

## 2020-12-17 MED ORDER — FAMOTIDINE 20 MG IN NS 100 ML IVPB
20.0000 mg | Freq: Once | INTRAVENOUS | Status: AC
  Administered 2020-12-17: 20 mg via INTRAVENOUS
  Filled 2020-12-17: qty 100

## 2020-12-17 MED ORDER — SODIUM CHLORIDE 0.9 % IV SOLN
300.0000 mg | Freq: Once | INTRAVENOUS | Status: AC
  Administered 2020-12-17: 300 mg via INTRAVENOUS
  Filled 2020-12-17: qty 300

## 2020-12-17 MED ORDER — LORATADINE 10 MG PO TABS
10.0000 mg | ORAL_TABLET | Freq: Once | ORAL | Status: AC
Start: 2020-12-17 — End: 2020-12-17
  Administered 2020-12-17: 10 mg via ORAL
  Filled 2020-12-17: qty 1

## 2020-12-17 NOTE — Patient Instructions (Signed)
[place]  Discharge Instructions: Thank you for choosing [facility] to provide your oncology and hematology care.  If you have a lab appointment with the [facility], please come in thru the Main Entrance and check in at the main information desk.  Wear comfortable clothing and clothing appropriate for easy access to any Portacath or PICC line.   We strive to give you quality time with your provider. You may need to reschedule your appointment if you arrive late (15 or more minutes).  Arriving late affects you and other patients whose appointments are after yours.  Also, if you miss three or more appointments without notifying the office, you may be dismissed from the clinic at the provider's discretion.      For prescription refill requests, have your pharmacy contact our office and allow 72 hours for refills to be completed.    Today you received Venofer.   To help prevent nausea and vomiting after your treatment, we encourage you to take your nausea medication as directed.  BELOW ARE SYMPTOMS THAT SHOULD BE REPORTED IMMEDIATELY: *FEVER GREATER THAN 100.4 F (38 C) OR HIGHER *CHILLS OR SWEATING *NAUSEA AND VOMITING THAT IS NOT CONTROLLED WITH YOUR NAUSEA MEDICATION *UNUSUAL SHORTNESS OF BREATH *UNUSUAL BRUISING OR BLEEDING *URINARY PROBLEMS (pain or burning when urinating, or frequent urination) *BOWEL PROBLEMS (unusual diarrhea, constipation, pain near the anus) TENDERNESS IN MOUTH AND THROAT WITH OR WITHOUT PRESENCE OF ULCERS (sore throat, sores in mouth, or a toothache) UNUSUAL RASH, SWELLING OR PAIN  UNUSUAL VAGINAL DISCHARGE OR ITCHING   Items with * indicate a potential emergency and should be followed up as soon as possible or go to the Emergency Department if any problems should occur.  Please show the CHEMOTHERAPY ALERT CARD or IMMUNOTHERAPY ALERT CARD at check-in to the Emergency Department and triage nurse.  Should you have questions after  your visit or need to cancel or reschedule your appointment, please contact [facility] [phone]  and follow the prompts.  Office hours are 8:00 a.m. to 4:30 p.m. Monday - Friday. Please note that voicemails left after 4:00 p.m. may not be returned until the following business day.  We are closed weekends and major holidays. You have access to a nurse at all times for urgent questions. Please call the main number to the clinic [phone] and follow the prompts.  For any non-urgent questions, you may also contact your provider using MyChart. We now offer e-Visits for anyone 65 and older to request care online for non-urgent symptoms. For details visit [email or web address].   Also download the MyChart app! Go to the app store, search "MyChart", open the app, select [place], and log in with your MyChart username and password.  Due to Covid, a mask is required upon entering the hospital/clinic. If you do not have a mask, one will be given to you upon arrival. For doctor visits, patients may have 1 support person aged 49 or older with them. For treatment visits, patients cannot have anyone with them due to current Covid guidelines and our immunocompromised population.

## 2020-12-17 NOTE — Progress Notes (Signed)
Patient tolerated iron infusion with no complaints voiced.  Peripheral IV site clean and dry with good blood return noted before and after infusion.  Band aid applied.  VSS with discharge and left in satisfactory condition with no s/s of distress noted.   

## 2020-12-20 ENCOUNTER — Inpatient Hospital Stay (HOSPITAL_COMMUNITY): Payer: Medicare Other

## 2020-12-20 ENCOUNTER — Other Ambulatory Visit: Payer: Self-pay

## 2020-12-20 VITALS — BP 126/50 | HR 73 | Temp 97.6°F | Resp 18

## 2020-12-20 DIAGNOSIS — D500 Iron deficiency anemia secondary to blood loss (chronic): Secondary | ICD-10-CM

## 2020-12-20 DIAGNOSIS — D509 Iron deficiency anemia, unspecified: Secondary | ICD-10-CM | POA: Diagnosis not present

## 2020-12-20 DIAGNOSIS — I959 Hypotension, unspecified: Secondary | ICD-10-CM | POA: Diagnosis not present

## 2020-12-20 MED ORDER — ACETAMINOPHEN 325 MG PO TABS
650.0000 mg | ORAL_TABLET | Freq: Once | ORAL | Status: AC
  Administered 2020-12-20: 650 mg via ORAL
  Filled 2020-12-20: qty 2

## 2020-12-20 MED ORDER — FAMOTIDINE 20 MG IN NS 100 ML IVPB
20.0000 mg | Freq: Once | INTRAVENOUS | Status: AC
  Administered 2020-12-20: 20 mg via INTRAVENOUS
  Filled 2020-12-20: qty 20

## 2020-12-20 MED ORDER — SODIUM CHLORIDE 0.9 % IV SOLN
400.0000 mg | Freq: Once | INTRAVENOUS | Status: AC
  Administered 2020-12-20: 400 mg via INTRAVENOUS
  Filled 2020-12-20: qty 20

## 2020-12-20 MED ORDER — SODIUM CHLORIDE 0.9 % IV SOLN: Freq: Once | INTRAVENOUS | Status: AC

## 2020-12-20 MED ORDER — LORATADINE 10 MG PO TABS
10.0000 mg | ORAL_TABLET | Freq: Once | ORAL | Status: AC
  Administered 2020-12-20: 10 mg via ORAL
  Filled 2020-12-20: qty 1

## 2020-12-20 NOTE — Progress Notes (Signed)
Patient presents today for Venofer 400 mg infusion per providers order.  Vital signs WNL.  Patient only complaints of chronic headache.  Peripheral IV started and blood return noted per and post infusion.  Venofer infusion given today per MD orders.  Stable during infusion without adverse affects.  Vital signs stable.  No complaints at this time.  Discharge from clinic ambulatory in stable condition.  Alert and oriented X 3.  Follow up with [facility] as scheduled.

## 2020-12-20 NOTE — Patient Instructions (Signed)
[facility]  Discharge Instructions: Thank you for choosing [facility] to provide your oncology and hematology care.  If you have a lab appointment with the Cancer Center, please come in thru the Main Entrance and check in at the main information desk.  Wear comfortable clothing and clothing appropriate for easy access to any Portacath or PICC line.   We strive to give you quality time with your provider. You may need to reschedule your appointment if you arrive late (15 or more minutes).  Arriving late affects you and other patients whose appointments are after yours.  Also, if you miss three or more appointments without notifying the office, you may be dismissed from the clinic at the provider's discretion.      For prescription refill requests, have your pharmacy contact our office and allow 72 hours for refills to be completed.    Today you received Venofer infusion.     To help prevent nausea and vomiting after your treatment, we encourage you to take your nausea medication as directed.  BELOW ARE SYMPTOMS THAT SHOULD BE REPORTED IMMEDIATELY: . *FEVER GREATER THAN 100.4 F (38 C) OR HIGHER . *CHILLS OR SWEATING . *NAUSEA AND VOMITING THAT IS NOT CONTROLLED WITH YOUR NAUSEA MEDICATION . *UNUSUAL SHORTNESS OF BREATH . *UNUSUAL BRUISING OR BLEEDING . *URINARY PROBLEMS (pain or burning when urinating, or frequent urination) . *BOWEL PROBLEMS (unusual diarrhea, constipation, pain near the anus) . TENDERNESS IN MOUTH AND THROAT WITH OR WITHOUT PRESENCE OF ULCERS (sore throat, sores in mouth, or a toothache) . UNUSUAL RASH, SWELLING OR PAIN  . UNUSUAL VAGINAL DISCHARGE OR ITCHING   Items with * indicate a potential emergency and should be followed up as soon as possible or go to the Emergency Department if any problems should occur.  Please show the CHEMOTHERAPY ALERT CARD or IMMUNOTHERAPY ALERT CARD at check-in to the Emergency Department and triage  nurse.  Should you have questions after your visit or need to cancel or reschedule your appointment, please contact [facility] [phone]  and follow the prompts.  Office hours are 8:00 a.m. to 4:30 p.m. Monday - Friday. Please note that voicemails left after 4:00 p.m. may not be returned until the following business day.  We are closed weekends and major holidays. You have access to a nurse at all times for urgent questions. Please call the main number to the clinic [phone] and follow the prompts.  For any non-urgent questions, you may also contact your provider using MyChart. We now offer e-Visits for anyone 18 and older to request care online for non-urgent symptoms. For details visit [email or web address].   Also download the MyChart app! Go to the app store, search "MyChart", open the app, select [facility], and log in with your MyChart username and password.  Due to Covid, a mask is required upon entering the hospital/clinic. If you do not have a mask, one will be given to you upon arrival. For doctor visits, patients may have 1 support person aged 18 or older with them. For treatment visits, patients cannot have anyone with them due to current Covid guidelines and our immunocompromised population.  

## 2020-12-25 ENCOUNTER — Other Ambulatory Visit: Payer: Self-pay | Admitting: Nurse Practitioner

## 2020-12-25 ENCOUNTER — Telehealth: Payer: Medicare Other | Admitting: *Deleted

## 2020-12-25 DIAGNOSIS — E039 Hypothyroidism, unspecified: Secondary | ICD-10-CM

## 2020-12-25 DIAGNOSIS — K219 Gastro-esophageal reflux disease without esophagitis: Secondary | ICD-10-CM

## 2020-12-31 DIAGNOSIS — D509 Iron deficiency anemia, unspecified: Secondary | ICD-10-CM | POA: Diagnosis not present

## 2021-01-16 ENCOUNTER — Other Ambulatory Visit: Payer: Self-pay

## 2021-01-16 ENCOUNTER — Inpatient Hospital Stay (HOSPITAL_COMMUNITY): Payer: Medicare Other | Attending: Hematology

## 2021-01-16 DIAGNOSIS — D500 Iron deficiency anemia secondary to blood loss (chronic): Secondary | ICD-10-CM | POA: Insufficient documentation

## 2021-01-16 DIAGNOSIS — K922 Gastrointestinal hemorrhage, unspecified: Secondary | ICD-10-CM | POA: Insufficient documentation

## 2021-01-16 LAB — COMPREHENSIVE METABOLIC PANEL
ALT: 10 U/L (ref 0–44)
AST: 16 U/L (ref 15–41)
Albumin: 3.7 g/dL (ref 3.5–5.0)
Alkaline Phosphatase: 36 U/L — ABNORMAL LOW (ref 38–126)
Anion gap: 7 (ref 5–15)
BUN: 19 mg/dL (ref 8–23)
CO2: 24 mmol/L (ref 22–32)
Calcium: 9.4 mg/dL (ref 8.9–10.3)
Chloride: 104 mmol/L (ref 98–111)
Creatinine, Ser: 0.86 mg/dL (ref 0.44–1.00)
GFR, Estimated: >60 mL/min (ref >60)
Glucose, Bld: 108 mg/dL — ABNORMAL HIGH (ref 70–99)
Potassium: 4.3 mmol/L (ref 3.5–5.1)
Sodium: 135 mmol/L (ref 135–145)
Total Bilirubin: 0.3 mg/dL (ref 0.3–1.2)
Total Protein: 6.7 g/dL (ref 6.5–8.1)

## 2021-01-16 LAB — IRON AND TIBC
Iron: 49 ug/dL (ref 28–170)
Saturation Ratios: 14 % (ref 10.4–31.8)
TIBC: 360 ug/dL (ref 250–450)
UIBC: 311 ug/dL

## 2021-01-16 LAB — CBC WITH DIFFERENTIAL/PLATELET
Abs Immature Granulocytes: 0.01 10*3/uL (ref 0.00–0.07)
Basophils Absolute: 0 10*3/uL (ref 0.0–0.1)
Basophils Relative: 1 %
Eosinophils Absolute: 0.2 10*3/uL (ref 0.0–0.5)
Eosinophils Relative: 3 %
HCT: 30.7 % — ABNORMAL LOW (ref 36.0–46.0)
Hemoglobin: 9.7 g/dL — ABNORMAL LOW (ref 12.0–15.0)
Immature Granulocytes: 0 %
Lymphocytes Relative: 20 %
Lymphs Abs: 1.1 10*3/uL (ref 0.7–4.0)
MCH: 29.6 pg (ref 26.0–34.0)
MCHC: 31.6 g/dL (ref 30.0–36.0)
MCV: 93.6 fL (ref 80.0–100.0)
Monocytes Absolute: 0.5 10*3/uL (ref 0.1–1.0)
Monocytes Relative: 8 %
Neutro Abs: 3.7 10*3/uL (ref 1.7–7.7)
Neutrophils Relative %: 68 %
Platelets: 311 10*3/uL (ref 150–400)
RBC: 3.28 MIL/uL — ABNORMAL LOW (ref 3.87–5.11)
RDW: 15.3 % (ref 11.5–15.5)
WBC: 5.5 10*3/uL (ref 4.0–10.5)
nRBC: 0 % (ref 0.0–0.2)

## 2021-01-16 LAB — LACTATE DEHYDROGENASE: LDH: 110 U/L (ref 98–192)

## 2021-01-16 LAB — FERRITIN: Ferritin: 60 ng/mL (ref 11–307)

## 2021-01-17 ENCOUNTER — Telehealth: Payer: Medicare Other

## 2021-01-17 LAB — KAPPA/LAMBDA LIGHT CHAINS
Kappa free light chain: 37.4 mg/L — ABNORMAL HIGH (ref 3.3–19.4)
Kappa, lambda light chain ratio: 1.47 (ref 0.26–1.65)
Lambda free light chains: 25.4 mg/L (ref 5.7–26.3)

## 2021-01-21 LAB — PROTEIN ELECTROPHORESIS, SERUM
A/G Ratio: 1.4 (ref 0.7–1.7)
Albumin ELP: 3.8 g/dL (ref 2.9–4.4)
Alpha-1-Globulin: 0.2 g/dL (ref 0.0–0.4)
Alpha-2-Globulin: 0.7 g/dL (ref 0.4–1.0)
Beta Globulin: 0.8 g/dL (ref 0.7–1.3)
Gamma Globulin: 0.9 g/dL (ref 0.4–1.8)
Globulin, Total: 2.7 g/dL (ref 2.2–3.9)
Total Protein ELP: 6.5 g/dL (ref 6.0–8.5)

## 2021-01-21 LAB — IMMUNOFIXATION ELECTROPHORESIS
IgA: 127 mg/dL (ref 64–422)
IgG (Immunoglobin G), Serum: 875 mg/dL (ref 586–1602)
IgM (Immunoglobulin M), Srm: 150 mg/dL (ref 26–217)
Total Protein ELP: 6.3 g/dL (ref 6.0–8.5)

## 2021-01-23 ENCOUNTER — Ambulatory Visit (HOSPITAL_COMMUNITY): Payer: Medicare Other | Admitting: Physician Assistant

## 2021-01-23 NOTE — Progress Notes (Signed)
[place], [place] [address]   CLINIC:  Medical Oncology/Hematology  PCP:  [name], [address] [place] [id number] [phone]   REASON FOR VISIT:  Follow-up for iron deficiency anemia  CURRENT THERAPY: Intermittent IV iron infusions (last Venofer 12/20/2020) and PRBC transfusions (last blood transfusion on 12/14/2020)  INTERVAL HISTORY:  Haley Trevino 80 y.o. female returns for routine follow-up of iron deficiency anemia secondary to chronic GI blood loss.  She was last evaluated by Tarri Abernethy PA-C via telemedicine visit on 12/12/2020.  She received PRBC x1 on 12/14/2020 and IV Venofer x3 (12/14/2020 through 12/20/2020).  At today's visit, she reports feeling fair.  No recent hospitalizations, surgeries, or changes in baseline health status.  She continues to have melena with most bowel movements, reports continued fatigue and occasional dizziness on standing.  Her symptoms were somewhat improved after she received IV iron.  She denies exertional chest pain, but does have chronic chest wall pain secondary to complications from breast implants.  No dyspnea on exertion, syncope, palpitations.  She has 15% energy and 25% appetite. She endorses that she is maintaining a stable weight.   REVIEW OF SYSTEMS:  Review of Systems  Constitutional:  Positive for fatigue. Negative for appetite change, chills, diaphoresis, fever and unexpected weight change.  HENT:   Negative for lump/mass and nosebleeds.   Eyes:  Negative for eye problems.  Respiratory:  Negative for cough, hemoptysis and shortness of breath.   Cardiovascular:  Positive for chest pain (chest wall pain). Negative for leg swelling and palpitations.  Gastrointestinal:  Positive for blood in stool. Negative for abdominal pain, constipation, diarrhea, nausea and vomiting.  Genitourinary:  Negative for hematuria.   Skin: Negative.   Neurological:  Positive for  dizziness and light-headedness. Negative for headaches.  Hematological:  Does not bruise/bleed easily.     PAST MEDICAL/SURGICAL HISTORY:  Past Medical History:  Diagnosis Date   Cataract    Depression    Dyspnea    GERD (gastroesophageal reflux disease)    Hyperlipidemia    Hypertension    Hypothyroidism    IBS (irritable bowel syndrome)    Osteoarthritis    Raynaud disease    Scleroderma ([place])    Thyroid disease    hypothyroidism   Past Surgical History:  Procedure Laterality Date   BREAST ENHANCEMENT SURGERY  1975   BREAST IMPLANT REMOVAL Bilateral 04/23/2016   Procedure: REMOVALBILATERAL BREAST IMPLANTS;  Surgeon: [place] Going, DO;  Location: [facility];  Service: Plastics;  Laterality: Bilateral;   CHOLECYSTECTOMY  1973   ESOPHAGOGASTRODUODENOSCOPY N/A 10/25/2020   Procedure: ESOPHAGOGASTRODUODENOSCOPY (EGD);  Surgeon: Clarene Essex, MD;  Location: [facility];  Service: Endoscopy;  Laterality: N/A;  enteroscopy   HAND SURGERY  08/2012; 11/2012   HOT HEMOSTASIS N/A 10/25/2020   Procedure: HOT HEMOSTASIS (ARGON PLASMA COAGULATION/BICAP);  Surgeon: Clarene Essex, MD;  Location: [facility];  Service: Endoscopy;  Laterality: N/A;   IR RADIOLOGIST EVAL & MGMT  07/27/2017   IR SACROPLASTY BILATERAL  07/31/2017   IR VERTEBROPLASTY CERV/THOR BX INC UNI/BIL INC/INJECT/IMAGING  03/13/2020   IR VERTEBROPLASTY EA ADDL (T&LS) BX INC UNI/BIL INC INJECT/IMAGING  03/14/2020   MELANOMA EXCISION     at 31 yrs of age   ORIF HIP FRACTURE Right 02/22/2014   Procedure: OPEN REDUCTION INTERNAL FIXATION RIGHT HIP;  Surgeon: Sanjuana Kava, MD;  Location: [facility];  Service: Orthopedics;  Laterality: Right;   TOTAL ABDOMINAL HYSTERECTOMY  1974     SOCIAL HISTORY:  Social History   Socioeconomic History   Marital status: Married    Spouse name: Not on file   Number of children: 2   Years of education: Not on file   Highest education level: Some college, no degree  Occupational  History   Occupation: retired Occupational psychologist at [employer]  Tobacco Use   Smoking status: Never   Smokeless tobacco: Never   Tobacco comments:    passive tobacco smoke exposure as child  Vaping Use   Vaping Use: Never used  Substance and Sexual Activity   Alcohol use: No   Drug use: No   Sexual activity: Not Currently    Birth control/protection: Surgical  Other Topics Concern   Not on file  Social History Narrative   Regular exercise: walking   Caffeine use: 1 cup of coffee daily   Social Determinants of Health   Financial Resource Strain: Low Risk    Difficulty of Paying Living Expenses: Not hard at all  Food Insecurity: No Food Insecurity   Worried About Charity fundraiser in the Last Year: Never true   [place] in the Last Year: Never true  Transportation Needs: No Transportation Needs   Lack of Transportation (Medical): No   Lack of Transportation (Non-Medical): No  Physical Activity: Inactive   Days of Exercise per Week: 0 days   Minutes of Exercise per Session: 0 min  Stress: Not on file  Social Connections: Socially Integrated   Frequency of Communication with Friends and Family: More than three times a week   Frequency of Social Gatherings with Friends and Family: Once a week   Attends Religious Services: 1 to 4 times per year   Active Member of [employer] or Organizations: Yes   Attends Archivist Meetings: Never   Marital Status: Married  Human resources officer Violence: Not At Risk   Fear of Current or Ex-Partner: No   Emotionally Abused: No   Physically Abused: No   Sexually Abused: No    FAMILY HISTORY:  Family History  Problem Relation Age of Onset   Pancreatic cancer Father    Raynaud syndrome Father    Cancer Father        pancreatic   Rashes / Skin problems Daughter        possibly scleraderma   Hip fracture Mother    Mental illness Mother        attempted suicide at 39 yo    CURRENT MEDICATIONS:  Outpatient Encounter Medications  as of 01/24/2021  Medication Sig   acetaminophen (TYLENOL) 500 MG tablet Take 1,000 mg by mouth every 6 (six) hours as needed for moderate pain or headache.   CALCIUM PO Take by mouth.   Cholecalciferol (VITAMIN D) 50 MCG (2000 UT) tablet Take 2,000 Units by mouth daily.   clonazePAM (KLONOPIN) 0.5 MG tablet Take 1 tablet (0.5 mg total) by mouth 2 (two) times daily as needed for anxiety.   ezetimibe (ZETIA) 10 MG tablet Take 1 tablet (10 mg total) by mouth daily.   Fe Fum-FA-B Cmp-C-Zn-Mg-Mn-Cu (HEMOCYTE PLUS) 106-1 MG CAPS Take 1 capsule by mouth daily.   FLUoxetine (PROZAC) 40 MG capsule Take 1 capsule (40 mg total) by mouth daily.   fluticasone (FLONASE) 50 MCG/ACT nasal spray PLACE 2 SPRAYS INTO BOTH NOSTRILS DAILY   HYDROcodone-acetaminophen (NORCO/VICODIN) 5-325 MG tablet Take 1 tablet by mouth daily as needed for moderate pain.   lisinopril (ZESTRIL) 40 MG  tablet Take 1 tablet (40 mg total) by mouth daily.   NIFEdipine (PROCARDIA XL/NIFEDICAL XL) 60 MG 24 hr tablet Take 1 tablet (60 mg total) by mouth daily.   omeprazole (PRILOSEC) 20 MG capsule TAKE 1 TABLET DAILY   PROLIA 60 MG/ML SOSY injection Inject 60 mg into the skin every 6 (six) months.   sildenafil (REVATIO) 20 MG tablet Take 1 tablet (20 mg total) by mouth 2 (two) times daily.   SYNTHROID 75 MCG tablet TAKE (1) TABLET DAILY BE- FORE BREAKFAST.   No facility-administered encounter medications on file as of 01/24/2021.    ALLERGIES:  Allergies  Allergen Reactions   Dilaudid [Hydromorphone Hcl] Anaphylaxis   Dilaudid [Hydromorphone] Anaphylaxis   Acyclovir And Related     Unknown reaction   Crestor [Rosuvastatin Calcium] Other (See Comments)    Muscle pain   Gabapentin Swelling    Swelling in feet, and in legs   Aleve [Naproxen Sodium] Rash   Lyrica [Pregabalin] Other (See Comments)    Swelling in feet, and in legs     PHYSICAL EXAM:  ECOG PERFORMANCE STATUS: 1 - Symptomatic but completely ambulatory  There were  no vitals filed for this visit. There were no vitals filed for this visit. Physical Exam Constitutional:      Appearance: Normal appearance.  HENT:     Head: Normocephalic and atraumatic.     Mouth/Throat:     Mouth: Mucous membranes are moist.  Eyes:     Extraocular Movements: Extraocular movements intact.     Pupils: Pupils are equal, round, and reactive to light.  Cardiovascular:     Rate and Rhythm: Normal rate and regular rhythm.     Pulses: Normal pulses.     Heart sounds: Normal heart sounds.  Pulmonary:     Effort: Pulmonary effort is normal.     Breath sounds: Normal breath sounds.  Abdominal:     General: Bowel sounds are normal.     Palpations: Abdomen is soft.     Tenderness: There is no abdominal tenderness.  Musculoskeletal:        General: No swelling.     Right lower leg: No edema.     Left lower leg: No edema.  Lymphadenopathy:     Cervical: No cervical adenopathy.  Skin:    General: Skin is warm and dry.  Neurological:     General: No focal deficit present.     Mental Status: She is alert and oriented to person, place, and time.  Psychiatric:        Mood and Affect: Mood normal.        Behavior: Behavior normal.     LABORATORY DATA:  I have reviewed the labs as listed.  CBC    Component Value Date/Time   WBC 5.5 01/16/2021 1053   RBC 3.28 (L) 01/16/2021 1053   HGB 9.7 (L) 01/16/2021 1053   HGB 11.4 11/12/2020 1415   HCT 30.7 (L) 01/16/2021 1053   HCT 31.5 (L) 11/12/2020 1415   PLT 311 01/16/2021 1053   PLT 369 11/12/2020 1415   MCV 93.6 01/16/2021 1053   MCV 94 11/12/2020 1415   MCH 29.6 01/16/2021 1053   MCHC 31.6 01/16/2021 1053   RDW 15.3 01/16/2021 1053   RDW 17.6 (H) 11/12/2020 1415   LYMPHSABS 1.1 01/16/2021 1053   LYMPHSABS 1.6 11/12/2020 1415   MONOABS 0.5 01/16/2021 1053   EOSABS 0.2 01/16/2021 1053   EOSABS 0.1 11/12/2020 1415   BASOSABS 0.0 01/16/2021  1053   BASOSABS 0.1 11/12/2020 1415   CMP Latest Ref Rng & Units  01/16/2021 12/10/2020 11/12/2020  Glucose 70 - 99 mg/dL 108(H) 112(H) 83  BUN 8 - 23 mg/dL 19 26(H) 11  Creatinine 0.44 - 1.00 mg/dL 0.86 1.11(H) 0.80  Sodium 135 - 145 mmol/L 135 133(L) 139  Potassium 3.5 - 5.1 mmol/L 4.3 4.2 4.6  Chloride 98 - 111 mmol/L 104 104 103  CO2 22 - 32 mmol/L 24 19(L) 21  Calcium 8.9 - 10.3 mg/dL 9.4 8.9 8.7  Total Protein 6.5 - 8.1 g/dL 6.7 7.6 6.9  Total Bilirubin 0.3 - 1.2 mg/dL 0.3 0.3 <0.2  Alkaline Phos 38 - 126 U/L 36(L) 38 51  AST 15 - 41 U/L 16 17 15   ALT 0 - 44 U/L 10 12 10     DIAGNOSTIC IMAGING:  I have independently reviewed the relevant imaging and discussed with the patient.  ASSESSMENT & PLAN: 1.  Iron deficiency anemia - Initially referred to hematology by PCP after labs obtained were 09/21/2020 significant for normocytic anemia (Hgb 10.2, MCV 83.0) and iron deficiency (ferritin 13) - EGD (10/25/2020): 2 angiodysplastic lesions without bleeding in the duodenum, another angiodysplastic lesion in duodenum; treated by [facility] - Capsule study (11/29/2020): At least 3 proximal and mid small bowel AVMs, nonbleeding, but also with some possible red material in the proximal colon - Other work-up: SPEP/IFE/light chains unremarkable; LDH normal; CMP unremarkable  - Blood transfusion: PRBC x1 on 12/14/2020 due to Hgb 7.0  - IV iron given with Venofer 1000 mg in March 2022 and again in June 2022 (most recent 12/20/2020) - Most recent labs (01/16/2021): Hgb 9.7 with MCV 93.6, iron saturation 14%, ferritin 60 - Differential diagnosis favors iron deficiency anemia secondary to chronic GI blood loss; may also be an aspect of malabsorption in the setting of scleroderma, low meat intake, and use of PPI - PLAN: Additional IV iron with IV Venofer x3 (300/300/400).  RTC in 2 months with repeat labs, or sooner if needed.  Continue follow-up with gastroenterology for ongoing management of GI bleeding.   PLAN SUMMARY & DISPOSITION: -Venofer x3 - RTC in 2 months with same-day  labs  All questions were answered. The patient knows to call the clinic with any problems, questions or concerns.  Medical decision making: Moderate (1 chronic problem not at goal, review of external notes, review of previous results, ordering new tests)  Time spent on visit: I spent 20 minutes counseling the patient face to face. The total time spent in the appointment was 30 minutes and more than 50% was on counseling.   Harriett Rush, PA-C  01/24/2021 2:49 PM

## 2021-01-24 ENCOUNTER — Inpatient Hospital Stay (HOSPITAL_BASED_OUTPATIENT_CLINIC_OR_DEPARTMENT_OTHER): Payer: Medicare Other | Admitting: Physician Assistant

## 2021-01-24 ENCOUNTER — Other Ambulatory Visit: Payer: Self-pay

## 2021-01-24 VITALS — BP 130/51 | HR 73 | Temp 97.2°F | Resp 16 | Wt 107.8 lb

## 2021-01-24 DIAGNOSIS — D500 Iron deficiency anemia secondary to blood loss (chronic): Secondary | ICD-10-CM

## 2021-01-24 DIAGNOSIS — K922 Gastrointestinal hemorrhage, unspecified: Secondary | ICD-10-CM | POA: Diagnosis not present

## 2021-01-24 NOTE — Patient Instructions (Signed)
[place] at [facility] Discharge Instructions  You were seen today by Tarri Abernethy PA-C for your iron deficiency anemia.  Your blood and iron levels continue to remain lower than their target goal.  As we have discussed, this is most likely due to ongoing intestinal bleeding.  Continue to follow-up with Dr. Watt Climes (gastroenterology) for ongoing testing and treatment of the source of this bleeding.  We will continue to provide supportive care with blood transfusions and iron infusions as needed to keep your blood and iron levels up.  We will schedule you for 3 IV iron infusions, and I will see you back in 2 months for repeat labs and follow-up visit.  LABS: Return in 2 months for repeat labs  OTHER TESTS: None  MEDICATIONS: No changes to previously prescribed home medications  FOLLOW-UP APPOINTMENT: Office visit in 2 months   Thank you for choosing [place] at [facility] to provide your oncology and hematology care.  To afford each patient quality time with our provider, please arrive at least 15 minutes before your scheduled appointment time.   If you have a lab appointment with the [facility] please come in thru the Main Entrance and check in at the main information desk.  You need to re-schedule your appointment should you arrive 10 or more minutes late.  We strive to give you quality time with our providers, and arriving late affects you and other patients whose appointments are after yours.  Also, if you no show three or more times for appointments you may be dismissed from the clinic at the providers discretion.     Again, thank you for choosing [facility].  Our hope is that these requests will decrease the amount of time that you wait before being seen by our physicians.       _____________________________________________________________  Should you have questions after your visit to [facility],  please contact our office at [phone] and follow the prompts.  Our office hours are 8:00 a.m. and 4:30 p.m. Monday - Friday.  Please note that voicemails left after 4:00 p.m. may not be returned until the following business day.  We are closed weekends and major holidays.  You do have access to a nurse 24-7, just call the main number to the clinic [phone] and do not press any options, hold on the line and a nurse will answer the phone.    For prescription refill requests, have your pharmacy contact our office and allow 72 hours.    Due to Covid, you will need to wear a mask upon entering the hospital. If you do not have a mask, a mask will be given to you at the Main Entrance upon arrival. For doctor visits, patients may have 1 support person age 19 or older with them. For treatment visits, patients can not have anyone with them due to social distancing guidelines and our immunocompromised population.

## 2021-01-28 ENCOUNTER — Other Ambulatory Visit: Payer: Self-pay

## 2021-01-28 ENCOUNTER — Inpatient Hospital Stay (HOSPITAL_COMMUNITY): Payer: Medicare Other

## 2021-01-28 VITALS — BP 108/51 | HR 76 | Temp 97.2°F | Resp 18

## 2021-01-28 DIAGNOSIS — D500 Iron deficiency anemia secondary to blood loss (chronic): Secondary | ICD-10-CM

## 2021-01-28 DIAGNOSIS — K922 Gastrointestinal hemorrhage, unspecified: Secondary | ICD-10-CM | POA: Diagnosis not present

## 2021-01-28 MED ORDER — ACETAMINOPHEN 325 MG PO TABS
650.0000 mg | ORAL_TABLET | Freq: Once | ORAL | Status: AC
  Administered 2021-01-28: 650 mg via ORAL
  Filled 2021-01-28: qty 2

## 2021-01-28 MED ORDER — SODIUM CHLORIDE 0.9 % IV SOLN: Freq: Once | INTRAVENOUS | Status: AC

## 2021-01-28 MED ORDER — SODIUM CHLORIDE 0.9 % IV SOLN
300.0000 mg | Freq: Once | INTRAVENOUS | Status: AC
  Administered 2021-01-28: 300 mg via INTRAVENOUS
  Filled 2021-01-28: qty 300

## 2021-01-28 MED ORDER — LORATADINE 10 MG PO TABS
10.0000 mg | ORAL_TABLET | Freq: Once | ORAL | Status: AC
  Administered 2021-01-28: 10 mg via ORAL
  Filled 2021-01-28: qty 1

## 2021-01-28 MED ORDER — FAMOTIDINE 20 MG PO TABS
20.0000 mg | ORAL_TABLET | Freq: Once | ORAL | Status: AC
  Administered 2021-01-28: 20 mg via ORAL
  Filled 2021-01-28: qty 1

## 2021-01-28 NOTE — Patient Instructions (Signed)
[place]  Discharge Instructions: Thank you for choosing [facility] to provide your oncology and hematology care.  If you have a lab appointment with the [place], please come in thru the Main Entrance and check in at the main information desk.    We strive to give you quality time with your provider. You may need to reschedule your appointment if you arrive late (15 or more minutes).  Arriving late affects you and other patients whose appointments are after yours.  Also, if you miss three or more appointments without notifying the office, you may be dismissed from the clinic at the provider's discretion.      For prescription refill requests, have your pharmacy contact our office and allow 72 hours for refills to be completed.       To help prevent nausea and vomiting after your treatment, we encourage you to take your nausea medication as directed.   Should you have questions after your visit or need to cancel or reschedule your appointment, please contact [facility] [phone]  and follow the prompts.  Office hours are 8:00 a.m. to 4:30 p.m. Monday - Friday. Please note that voicemails left after 4:00 p.m. may not be returned until the following business day.  We are closed weekends and major holidays. You have access to a nurse at all times for urgent questions. Please call the main number to the clinic [phone] and follow the prompts.  For any non-urgent questions, you may also contact your provider using MyChart. We now offer e-Visits for anyone 59 and older to request care online for non-urgent symptoms. For details visit [email or web address].   Also download the MyChart app! Go to the app store, search "MyChart", open the app, select [facility], and log in with your MyChart username and password.  Due to Covid, a mask is required upon entering the hospital/clinic. If you do not have a mask, one will be given to you upon arrival.  For doctor visits, patients may have 1 support person aged 64 or older with them. For treatment visits, patients cannot have anyone with them due to current Covid guidelines and our immunocompromised population.

## 2021-01-28 NOTE — Progress Notes (Signed)
Iron infusion given per orders. Patient tolerated it well without problems. Vitals stable and discharged home from clinic ambulatory. Follow up as scheduled.  

## 2021-01-30 ENCOUNTER — Inpatient Hospital Stay (HOSPITAL_COMMUNITY): Payer: Medicare Other

## 2021-01-30 ENCOUNTER — Other Ambulatory Visit: Payer: Self-pay

## 2021-01-30 VITALS — BP 108/44 | HR 73 | Temp 97.2°F | Resp 18

## 2021-01-30 DIAGNOSIS — D500 Iron deficiency anemia secondary to blood loss (chronic): Secondary | ICD-10-CM | POA: Diagnosis not present

## 2021-01-30 DIAGNOSIS — K922 Gastrointestinal hemorrhage, unspecified: Secondary | ICD-10-CM | POA: Diagnosis not present

## 2021-01-30 MED ORDER — SODIUM CHLORIDE 0.9 % IV SOLN
300.0000 mg | Freq: Once | INTRAVENOUS | Status: AC
  Administered 2021-01-30: 300 mg via INTRAVENOUS
  Filled 2021-01-30: qty 300

## 2021-01-30 MED ORDER — ACETAMINOPHEN 325 MG PO TABS
650.0000 mg | ORAL_TABLET | Freq: Once | ORAL | Status: AC
  Administered 2021-01-30: 650 mg via ORAL
  Filled 2021-01-30: qty 2

## 2021-01-30 MED ORDER — LORATADINE 10 MG PO TABS
10.0000 mg | ORAL_TABLET | Freq: Once | ORAL | Status: AC
  Administered 2021-01-30: 10 mg via ORAL
  Filled 2021-01-30: qty 1

## 2021-01-30 MED ORDER — SODIUM CHLORIDE 0.9 % IV SOLN: Freq: Once | INTRAVENOUS | Status: AC

## 2021-01-30 NOTE — Progress Notes (Signed)
Pt here today for IV iron infusion. Vital signs stable and pt voiced no new complaints. Peripheral IV started with good blood return pre and

## 2021-01-30 NOTE — Patient Instructions (Signed)
[facility]  Discharge Instructions: Thank you for choosing [facility] to provide your oncology and hematology care.  If you have a lab appointment with the [facility], please come in thru the Main Entrance and check in at the main information desk.  Wear comfortable clothing and clothing appropriate for easy access to any Portacath or PICC line.   We strive to give you quality time with your provider. You may need to reschedule your appointment if you arrive late (15 or more minutes).  Arriving late affects you and other patients whose appointments are after yours.  Also, if you miss three or more appointments without notifying the office, you may be dismissed from the clinic at the provider's discretion.      For prescription refill requests, have your pharmacy contact our office and allow 72 hours for refills to be completed.    Today you received the following chemotherapy and/or immunotherapy agents iron infusion      To help prevent nausea and vomiting after your treatment, we encourage you to take your nausea medication as directed.  BELOW ARE SYMPTOMS THAT SHOULD BE REPORTED IMMEDIATELY: *FEVER GREATER THAN 100.4 F (38 C) OR HIGHER *CHILLS OR SWEATING *NAUSEA AND VOMITING THAT IS NOT CONTROLLED WITH YOUR NAUSEA MEDICATION *UNUSUAL SHORTNESS OF BREATH *UNUSUAL BRUISING OR BLEEDING *URINARY PROBLEMS (pain or burning when urinating, or frequent urination) *BOWEL PROBLEMS (unusual diarrhea, constipation, pain near the anus) TENDERNESS IN MOUTH AND THROAT WITH OR WITHOUT PRESENCE OF ULCERS (sore throat, sores in mouth, or a toothache) UNUSUAL RASH, SWELLING OR PAIN  UNUSUAL VAGINAL DISCHARGE OR ITCHING   Items with * indicate a potential emergency and should be followed up as soon as possible or go to the Emergency Department if any problems should occur.  Please show the CHEMOTHERAPY ALERT CARD or IMMUNOTHERAPY ALERT CARD at check-in to the Emergency  Department and triage nurse.  Should you have questions after your visit or need to cancel or reschedule your appointment, please contact [facility] [phone]  and follow the prompts.  Office hours are 8:00 a.m. to 4:30 p.m. Monday - Friday. Please note that voicemails left after 4:00 p.m. may not be returned until the following business day.  We are closed weekends and major holidays. You have access to a nurse at all times for urgent questions. Please call the main number to the clinic [phone] and follow the prompts.  For any non-urgent questions, you may also contact your provider using MyChart. We now offer e-Visits for anyone 50 and older to request care online for non-urgent symptoms. For details visit [email or web address].   Also download the MyChart app! Go to the app store, search "MyChart", open the app, select [facility], and log in with your MyChart username and password.  Due to Covid, a mask is required upon entering the hospital/clinic. If you do not have a mask, one will be given to you upon arrival. For doctor visits, patients may have 1 support person aged 46 or older with them. For treatment visits, patients cannot have anyone with them due to current Covid guidelines and our immunocompromised population.

## 2021-01-30 NOTE — Progress Notes (Signed)
Tolerated iron infusion well today without incidence.  Stable during and after infusion.  Refused to wait to 30 minute wait time per protocol. AVS reviewed. Vital signs stable prior to discharge.  Discharged in stable condition ambulatory.

## 2021-02-04 ENCOUNTER — Inpatient Hospital Stay (HOSPITAL_COMMUNITY): Payer: Medicare Other | Attending: Hematology

## 2021-02-04 ENCOUNTER — Other Ambulatory Visit: Payer: Self-pay

## 2021-02-04 VITALS — BP 109/57 | HR 81 | Temp 97.1°F | Resp 18

## 2021-02-04 DIAGNOSIS — D500 Iron deficiency anemia secondary to blood loss (chronic): Secondary | ICD-10-CM

## 2021-02-04 DIAGNOSIS — D509 Iron deficiency anemia, unspecified: Secondary | ICD-10-CM | POA: Diagnosis not present

## 2021-02-04 MED ORDER — LORATADINE 10 MG PO TABS
10.0000 mg | ORAL_TABLET | Freq: Once | ORAL | Status: AC
  Administered 2021-02-04: 10 mg via ORAL
  Filled 2021-02-04: qty 1

## 2021-02-04 MED ORDER — SODIUM CHLORIDE 0.9 % IV SOLN
400.0000 mg | Freq: Once | INTRAVENOUS | Status: AC
  Administered 2021-02-04: 400 mg via INTRAVENOUS
  Filled 2021-02-04: qty 20

## 2021-02-04 MED ORDER — SODIUM CHLORIDE 0.9 % IV SOLN: Freq: Once | INTRAVENOUS | Status: AC

## 2021-02-04 MED ORDER — ACETAMINOPHEN 325 MG PO TABS
650.0000 mg | ORAL_TABLET | Freq: Once | ORAL | Status: AC
  Administered 2021-02-04: 650 mg via ORAL
  Filled 2021-02-04: qty 2

## 2021-02-04 NOTE — Progress Notes (Signed)
Patient is here today for iron infusion.  She complains of fatigue and headache today.  Vital signs are stable.    Patient received Venofer iron infusion.  Patient tolerated infusion well with no complaints during transfusion.  Patient left in satisfactory condition with vital signs stable.

## 2021-02-04 NOTE — Patient Instructions (Signed)
[facility]  Discharge Instructions: Thank you for choosing [facility] to provide your oncology and hematology care.  If you have a lab appointment with the Cancer Center, please come in thru the Main Entrance and check in at the main information desk.  We strive to give you quality time with your provider. You may need to reschedule your appointment if you arrive late (15 or more minutes).  Arriving late affects you and other patients whose appointments are after yours.  Also, if you miss three or more appointments without notifying the office, you may be dismissed from the clinic at the provider's discretion.      For prescription refill requests, have your pharmacy contact our office and allow 72 hours for refills to be completed.     To help prevent nausea and vomiting after your treatment, we encourage you to take your nausea medication as directed.  BELOW ARE SYMPTOMS THAT SHOULD BE REPORTED IMMEDIATELY: *FEVER GREATER THAN 100.4 F (38 C) OR HIGHER *CHILLS OR SWEATING *NAUSEA AND VOMITING THAT IS NOT CONTROLLED WITH YOUR NAUSEA MEDICATION *UNUSUAL SHORTNESS OF BREATH *UNUSUAL BRUISING OR BLEEDING *URINARY PROBLEMS (pain or burning when urinating, or frequent urination) *BOWEL PROBLEMS (unusual diarrhea, constipation, pain near the anus) TENDERNESS IN MOUTH AND THROAT WITH OR WITHOUT PRESENCE OF ULCERS (sore throat, sores in mouth, or a toothache) UNUSUAL RASH, SWELLING OR PAIN  UNUSUAL VAGINAL DISCHARGE OR ITCHING   Items with * indicate a potential emergency and should be followed up as soon as possible or go to the Emergency Department if any problems should occur.  Should you have questions after your visit or need to cancel or reschedule your appointment, please contact [facility] [phone]  and follow the prompts.  Office hours are 8:00 a.m. to 4:30 p.m. Monday - Friday. Please note that voicemails left after 4:00 p.m. may not be  returned until the following business day.  We are closed weekends and major holidays. You have access to a nurse at all times for urgent questions. Please call the main number to the clinic [phone] and follow the prompts.  For any non-urgent questions, you may also contact your provider using MyChart. We now offer e-Visits for anyone 18 and older to request care online for non-urgent symptoms. For details visit [email or web address].   Also download the MyChart app! Go to the app store, search "MyChart", open the app, select [facility], and log in with your MyChart username and password.  Due to Covid, a mask is required upon entering the hospital/clinic. If you do not have a mask, one will be given to you upon arrival. For doctor visits, patients may have 1 support person aged 18 or older with them. For treatment visits, patients cannot have anyone with them due to current Covid guidelines and our immunocompromised population.  

## 2021-02-13 ENCOUNTER — Other Ambulatory Visit: Payer: Self-pay

## 2021-02-13 ENCOUNTER — Ambulatory Visit (INDEPENDENT_AMBULATORY_CARE_PROVIDER_SITE_OTHER): Payer: Medicare Other | Admitting: Nurse Practitioner

## 2021-02-13 ENCOUNTER — Encounter: Payer: Self-pay | Admitting: Nurse Practitioner

## 2021-02-13 VITALS — BP 129/63 | HR 78 | Temp 99.7°F | Resp 20 | Ht 62.0 in | Wt 108.0 lb

## 2021-02-13 DIAGNOSIS — D649 Anemia, unspecified: Secondary | ICD-10-CM | POA: Diagnosis not present

## 2021-02-13 DIAGNOSIS — M810 Age-related osteoporosis without current pathological fracture: Secondary | ICD-10-CM

## 2021-02-13 DIAGNOSIS — K582 Mixed irritable bowel syndrome: Secondary | ICD-10-CM | POA: Diagnosis not present

## 2021-02-13 DIAGNOSIS — F3342 Major depressive disorder, recurrent, in full remission: Secondary | ICD-10-CM | POA: Diagnosis not present

## 2021-02-13 DIAGNOSIS — E785 Hyperlipidemia, unspecified: Secondary | ICD-10-CM | POA: Diagnosis not present

## 2021-02-13 DIAGNOSIS — I7300 Raynaud's syndrome without gangrene: Secondary | ICD-10-CM

## 2021-02-13 DIAGNOSIS — F419 Anxiety disorder, unspecified: Secondary | ICD-10-CM | POA: Diagnosis not present

## 2021-02-13 DIAGNOSIS — I10 Essential (primary) hypertension: Secondary | ICD-10-CM | POA: Diagnosis not present

## 2021-02-13 DIAGNOSIS — K219 Gastro-esophageal reflux disease without esophagitis: Secondary | ICD-10-CM | POA: Diagnosis not present

## 2021-02-13 DIAGNOSIS — M349 Systemic sclerosis, unspecified: Secondary | ICD-10-CM

## 2021-02-13 DIAGNOSIS — E039 Hypothyroidism, unspecified: Secondary | ICD-10-CM

## 2021-02-13 LAB — HEMOGLOBIN, FINGERSTICK: Hemoglobin: 12.2 g/dL (ref 11.1–15.9)

## 2021-02-13 MED ORDER — CLONAZEPAM 0.5 MG PO TABS: 0.5000 mg | ORAL_TABLET | Freq: Two times a day (BID) | ORAL | 1 refills | Status: DC | PRN

## 2021-02-13 MED ORDER — OMEPRAZOLE 20 MG PO CPDR: DELAYED_RELEASE_CAPSULE | ORAL | 1 refills | Status: DC

## 2021-02-13 MED ORDER — FLUOXETINE HCL 40 MG PO CAPS: 40.0000 mg | ORAL_CAPSULE | Freq: Every day | ORAL | 1 refills | Status: DC

## 2021-02-13 MED ORDER — NIFEDIPINE ER OSMOTIC RELEASE 60 MG PO TB24: 60.0000 mg | ORAL_TABLET | Freq: Every day | ORAL | 1 refills | Status: DC

## 2021-02-13 MED ORDER — EZETIMIBE 10 MG PO TABS: 10.0000 mg | ORAL_TABLET | Freq: Every day | ORAL | 1 refills | Status: DC

## 2021-02-13 MED ORDER — SILDENAFIL CITRATE 20 MG PO TABS: 20.0000 mg | ORAL_TABLET | Freq: Two times a day (BID) | ORAL | 1 refills | Status: DC

## 2021-02-13 NOTE — Progress Notes (Signed)
Subjective:    Patient ID: Haley Trevino, female    DOB: 07/30/1940, 80 y.o.   MRN: [id number]   Chief Complaint: medical management of chronic issues     HPI:  1. Primary hypertension No c/o chest pain, sob or headache. Does not check blood pressure at home. BP Readings from Last 3 Encounters:  02/04/21 (!) 109/57  01/30/21 (!) 108/44  01/28/21 (!) 108/51     2. Hyperlipidemia with target LDL less than 100 Does not watch diet and does no dedicated exercise. Refuses cholesterol Lab Results  Component Value Date   CHOL 203 (H) 11/12/2020   HDL 48 11/12/2020   LDLCALC 131 (H) 11/12/2020   TRIG 135 11/12/2020   CHOLHDL 4.2 11/12/2020     3. Gastroesophageal reflux disease without esophagitis Is on omeprazole daily and seems to work well to keep symptoms under control.  4. Irritable bowel syndrome with both constipation and diarrhea She flips flops back and forth from constipation and diarrhea  5. Acquired hypothyroidism No problems that she is aware of Lab Results  Component Value Date   TSH 1.410 11/12/2020     6. Age-related osteoporosis without current pathological fracture Is not able to do weight bearing exercises due to her scleraderma. . Her last dexascan was done on 08/16/20. Her t score was -3.6. sh eis currently doing prolia injections.  7. Recurrent major depressive disorder, in full remission ([place]) Has beenon prozac for years and is doing ok  8. SCLERODERMA Sees rheumatology every 6  months. Is maintaining at this point.  9. raynauds Really bad in fingers. She gets sores on them often is on procrdia and sildenafil daily. The warm weather really helps.  10 iron deficiency anemia She has had multiple iron infusion and 1 blood tranufusion. She feels really good right now. They still cannot figure out where she was losing blod. Sees GI, Dr. Watt Climes.   Outpatient Encounter Medications as of 02/13/2021  Medication Sig   acetaminophen (TYLENOL) 500  MG tablet Take 1,000 mg by mouth every 6 (six) hours as needed for moderate pain or headache.   CALCIUM PO Take by mouth.   Cholecalciferol (VITAMIN D) 50 MCG (2000 UT) tablet Take 2,000 Units by mouth daily.   clonazePAM (KLONOPIN) 0.5 MG tablet Take 1 tablet (0.5 mg total) by mouth 2 (two) times daily as needed for anxiety.   ezetimibe (ZETIA) 10 MG tablet Take 1 tablet (10 mg total) by mouth daily.   FLUoxetine (PROZAC) 40 MG capsule Take 1 capsule (40 mg total) by mouth daily.   fluticasone (FLONASE) 50 MCG/ACT nasal spray PLACE 2 SPRAYS INTO BOTH NOSTRILS DAILY   HYDROcodone-acetaminophen (NORCO/VICODIN) 5-325 MG tablet Take 1 tablet by mouth daily as needed for moderate pain.   NIFEdipine (PROCARDIA XL/NIFEDICAL XL) 60 MG 24 hr tablet Take 1 tablet (60 mg total) by mouth daily.   omeprazole (PRILOSEC) 20 MG capsule TAKE 1 TABLET DAILY   PROLIA 60 MG/ML SOSY injection Inject 60 mg into the skin every 6 (six) months.   RESTASIS 0.05 % ophthalmic emulsion Place 1 drop into both eyes daily.   sildenafil (REVATIO) 20 MG tablet Take 1 tablet (20 mg total) by mouth 2 (two) times daily.   SYNTHROID 75 MCG tablet TAKE (1) TABLET DAILY BE- FORE BREAKFAST.   No facility-administered encounter medications on file as of 02/13/2021.    Past Surgical History:  Procedure Laterality Date   BREAST ENHANCEMENT SURGERY  1975   BREAST IMPLANT  REMOVAL Bilateral 04/23/2016   Procedure: REMOVALBILATERAL BREAST IMPLANTS;  Surgeon: Wallace Going, DO;  Location: [facility];  Service: Plastics;  Laterality: Bilateral;   CHOLECYSTECTOMY  1973   ESOPHAGOGASTRODUODENOSCOPY N/A 10/25/2020   Procedure: ESOPHAGOGASTRODUODENOSCOPY (EGD);  Surgeon: Clarene Essex, MD;  Location: [facility];  Service: Endoscopy;  Laterality: N/A;  enteroscopy   HAND SURGERY  08/2012; 11/2012   HOT HEMOSTASIS N/A 10/25/2020   Procedure: HOT HEMOSTASIS (ARGON PLASMA COAGULATION/BICAP);  Surgeon: Clarene Essex, MD;  Location:  [facility];  Service: Endoscopy;  Laterality: N/A;   IR RADIOLOGIST EVAL & MGMT  07/27/2017   IR SACROPLASTY BILATERAL  07/31/2017   IR VERTEBROPLASTY CERV/THOR BX INC UNI/BIL INC/INJECT/IMAGING  03/13/2020   IR VERTEBROPLASTY EA ADDL (T&LS) BX INC UNI/BIL INC INJECT/IMAGING  03/14/2020   MELANOMA EXCISION     at 31 yrs of age   ORIF HIP FRACTURE Right 02/22/2014   Procedure: OPEN REDUCTION INTERNAL FIXATION RIGHT HIP;  Surgeon: Sanjuana Kava, MD;  Location: [facility];  Service: Orthopedics;  Laterality: Right;   TOTAL ABDOMINAL HYSTERECTOMY  1974    Family History  Problem Relation Age of Onset   Pancreatic cancer Father    Raynaud syndrome Father    Cancer Father        pancreatic   Rashes / Skin problems Daughter        possibly scleraderma   Hip fracture Mother    Mental illness Mother        attempted suicide at 74 yo    New complaints: None today  Social history: Lives with her husband who is not in good health  Controlled substance contract: 01/24/20     Review of Systems  Constitutional:  Negative for diaphoresis.  Eyes:  Negative for pain.  Respiratory:  Negative for shortness of breath.   Cardiovascular:  Negative for chest pain, palpitations and leg swelling.  Gastrointestinal:  Negative for abdominal pain.  Endocrine: Negative for polydipsia.  Skin:  Negative for rash.  Neurological:  Negative for dizziness, weakness and headaches.  Hematological:  Does not bruise/bleed easily.  All other systems reviewed and are negative.     Objective:   Physical Exam Vitals and nursing note reviewed.  Constitutional:      General: She is not in acute distress.    Appearance: Normal appearance. She is well-developed.  HENT:     Head: Normocephalic.     Right Ear: Tympanic membrane normal.     Left Ear: Tympanic membrane normal.     Nose: Nose normal.     Mouth/Throat:     Mouth: Mucous membranes are moist.  Eyes:     Pupils: Pupils are equal, round, and reactive to  light.  Neck:     Vascular: No carotid bruit or JVD.  Cardiovascular:     Rate and Rhythm: Normal rate and regular rhythm.     Heart sounds: Murmur (3/6 systolic murmur) heard.  Pulmonary:     Effort: Pulmonary effort is normal. No respiratory distress.     Breath sounds: Normal breath sounds. No wheezing or rales.  Chest:     Chest wall: No tenderness.  Abdominal:     General: Bowel sounds are normal. There is no distension or abdominal bruit.     Palpations: Abdomen is soft. There is no hepatomegaly, splenomegaly, mass or pulsatile mass.     Tenderness: There is no abdominal tenderness.  Musculoskeletal:        General: Normal range of  motion.     Cervical back: Normal range of motion and neck supple.  Lymphadenopathy:     Cervical: No cervical adenopathy.  Skin:    General: Skin is warm and dry.     Comments: Slow capillay refill in all fingers No sores noted on fingers today   Neurological:     Mental Status: She is alert and oriented to person, place, and time.     Deep Tendon Reflexes: Reflexes are normal and symmetric.  Psychiatric:        Behavior: Behavior normal.        Thought Content: Thought content normal.        Judgment: Judgment normal.    BP 129/63   Pulse 78   Temp 99.7 F (37.6 C) (Temporal)   Resp 20   Ht '5\' 2"'  (1.575 m)   Wt 108 lb (49 kg)   SpO2 96%   BMI 19.75 kg/m   Hgb 12.2      Assessment & Plan:   JAYLEENE GLAESER comes in today with chief complaint of Medical Management of Chronic Issues   Diagnosis and orders addressed:  1. Primary hypertension Low sodium diet - CBC with Differential/Platelet - CMP14+EGFR  2. Hyperlipidemia with target LDL less than 100 Low fat diet - Lipid panel - ezetimibe (ZETIA) 10 MG tablet; Take 1 tablet (10 mg total) by mouth daily.  Dispense: 90 tablet; Refill: 1  3. Gastroesophageal reflux disease without esophagitis Avoid spicy foods Do not eat 2 hours prior to bedtime - omeprazole  (PRILOSEC) 20 MG capsule; TAKE 1 TABLET DAILY  Dispense: 90 capsule; Refill: 1  4. Irritable bowel syndrome with both constipation and diarrhea Watch diet to see if certain foods cause issues  5. Acquired hypothyroidism Labs pending  6. Age-related osteoporosis without current pathological fracture Weight bearing exercise when can tolerate Contiue prolia  7. Recurrent major depressive disorder, in full remission ([place]) Stress management - FLUoxetine (PROZAC) 40 MG capsule; Take 1 capsule (40 mg total) by mouth daily.  Dispense: 90 capsule; Refill: 1  8. SCLERODERMA Keep follow up with rheumatology  9. Iron deficiency anemia due to chronic blood loss Labs pending - Hemoglobin, fingerstick  10. [name] without gangrene Keep fingers warm - NIFEdipine (PROCARDIA XL/NIFEDICAL XL) 60 MG 24 hr tablet; Take 1 tablet (60 mg total) by mouth daily.  Dispense: 90 tablet; Refill: 1 - sildenafil (REVATIO) 20 MG tablet; Take 1 tablet (20 mg total) by mouth 2 (two) times daily.  Dispense: 180 tablet; Refill: 1  11. Anxiety - clonazePAM (KLONOPIN) 0.5 MG tablet; Take 1 tablet (0.5 mg total) by mouth 2 (two) times daily as needed for anxiety.  Dispense: 180 tablet; Refill: 1    Labs pending Health Maintenance reviewed Diet and exercise encouraged  Follow up plan: 6 months   Mary-Margaret Hassell Done, FNP

## 2021-02-14 LAB — CBC WITH DIFFERENTIAL/PLATELET
Basophils Absolute: 0.1 10*3/uL (ref 0.0–0.2)
Basos: 1 %
EOS (ABSOLUTE): 0.3 10*3/uL (ref 0.0–0.4)
Eos: 4 %
Hematocrit: 33.2 % — ABNORMAL LOW (ref 34.0–46.6)
Hemoglobin: 10.1 g/dL — ABNORMAL LOW (ref 11.1–15.9)
Immature Grans (Abs): 0 10*3/uL (ref 0.0–0.1)
Immature Granulocytes: 0 %
Lymphocytes Absolute: 1.3 10*3/uL (ref 0.7–3.1)
Lymphs: 17 %
MCH: 28.9 pg (ref 26.6–33.0)
MCHC: 30.4 g/dL — ABNORMAL LOW (ref 31.5–35.7)
MCV: 95 fL (ref 79–97)
Monocytes Absolute: 0.5 10*3/uL (ref 0.1–0.9)
Monocytes: 7 %
Neutrophils Absolute: 5.4 10*3/uL (ref 1.4–7.0)
Neutrophils: 71 %
Platelets: 328 10*3/uL (ref 150–450)
RBC: 3.49 x10E6/uL — ABNORMAL LOW (ref 3.77–5.28)
RDW: 15.3 % (ref 11.7–15.4)
WBC: 7.6 10*3/uL (ref 3.4–10.8)

## 2021-02-14 LAB — CMP14+EGFR
ALT: 7 IU/L (ref 0–32)
AST: 13 IU/L (ref 0–40)
Albumin/Globulin Ratio: 1.8 (ref 1.2–2.2)
Albumin: 4.2 g/dL (ref 3.7–4.7)
Alkaline Phosphatase: 52 IU/L (ref 44–121)
BUN/Creatinine Ratio: 13 (ref 12–28)
BUN: 11 mg/dL (ref 8–27)
Bilirubin Total: <0.2 mg/dL (ref 0.0–1.2)
CO2: 19 mmol/L — ABNORMAL LOW (ref 20–29)
Calcium: 8 mg/dL — ABNORMAL LOW (ref 8.7–10.3)
Chloride: 108 mmol/L — ABNORMAL HIGH (ref 96–106)
Creatinine, Ser: 0.82 mg/dL (ref 0.57–1.00)
Globulin, Total: 2.4 g/dL (ref 1.5–4.5)
Glucose: 118 mg/dL — ABNORMAL HIGH (ref 65–99)
Potassium: 4.2 mmol/L (ref 3.5–5.2)
Sodium: 140 mmol/L (ref 134–144)
Total Protein: 6.6 g/dL (ref 6.0–8.5)
eGFR: 73 mL/min/{1.73_m2} (ref >59)

## 2021-02-14 LAB — LIPID PANEL
Chol/HDL Ratio: 3.5 ratio (ref 0.0–4.4)
Cholesterol, Total: 174 mg/dL (ref 100–199)
HDL: 50 mg/dL (ref >39)
LDL Chol Calc (NIH): 98 mg/dL (ref 0–99)
Triglycerides: 148 mg/dL (ref 0–149)
VLDL Cholesterol Cal: 26 mg/dL (ref 5–40)

## 2021-02-26 ENCOUNTER — Telehealth: Payer: Medicare Other

## 2021-03-12 ENCOUNTER — Telehealth: Payer: Self-pay | Admitting: Nurse Practitioner

## 2021-03-12 ENCOUNTER — Other Ambulatory Visit: Payer: Self-pay

## 2021-03-12 DIAGNOSIS — Z9886 Personal history of breast implant removal: Secondary | ICD-10-CM

## 2021-03-12 DIAGNOSIS — N644 Mastodynia: Secondary | ICD-10-CM

## 2021-03-14 DIAGNOSIS — L821 Other seborrheic keratosis: Secondary | ICD-10-CM | POA: Diagnosis not present

## 2021-03-14 DIAGNOSIS — L570 Actinic keratosis: Secondary | ICD-10-CM | POA: Diagnosis not present

## 2021-03-29 NOTE — Progress Notes (Signed)
[place], [place] [address]   CLINIC:  Medical Oncology/Hematology  PCP:  [name], [place] [address] [phone]   REASON FOR VISIT:  Follow-up for iron deficiency anemia   CURRENT THERAPY: Intermittent IV iron infusions (last Venofer 02/04/2021) and PRBC transfusions (last blood transfusion on 12/14/2020)  INTERVAL HISTORY:  Haley Trevino 80 y.o. female returns for routine follow-up of iron deficiency anemia secondary to chronic GI blood loss.  She was last seen by Tarri Abernethy PA-C on 01/24/2021.  She received IV Venofer with 300 mg x 3 from 01/28/2021 through 02/04/2021.  At today's visit, she reports feeling fair apart from significant fatigue.  No recent hospitalizations, surgeries, or changes in baseline health status.  She reports that she did feel improved energy after IV iron (02/04/2021), but over the past several weeks she has been noticing progressive fatigue again.  She reports that she feels "weak and shaky," and tires quickly with exertion.  She also notes some dyspnea on exertion and dizziness with standing.  Her melena has resolved, and she has not noted any black stool since mid August.  She denies any hematemesis, hematochezia, epistaxis, or other sources of blood loss.  She continues to follow with gastroenterology, and has an appointment with them on 05/03/2021.  She continues to suffer from chronic chest wall pain secondary to complications from breast implants and scleroderma.  She has little to no energy and 50% appetite. She endorses that she is maintaining a stable weight.    REVIEW OF SYSTEMS:  Review of Systems  Constitutional:  Positive for fatigue. Negative for appetite change, chills, diaphoresis, fever and unexpected weight change.  HENT:   Negative for lump/mass and nosebleeds.   Eyes:  Negative for eye problems.  Respiratory:  Positive for shortness of breath (With  exertion). Negative for cough and hemoptysis.   Cardiovascular:  Negative for chest pain, leg swelling and palpitations.  Gastrointestinal:  Negative for abdominal pain, blood in stool, constipation, diarrhea, nausea and vomiting.  Genitourinary:  Negative for hematuria.   Skin: Negative.   Neurological:  Negative for dizziness, headaches and light-headedness.  Hematological:  Does not bruise/bleed easily.  Psychiatric/Behavioral:  Positive for depression and sleep disturbance. The patient is nervous/anxious.      PAST MEDICAL/SURGICAL HISTORY:  Past Medical History:  Diagnosis Date   Cataract    Depression    Dyspnea    GERD (gastroesophageal reflux disease)    Hyperlipidemia    Hypertension    Hypothyroidism    IBS (irritable bowel syndrome)    Osteoarthritis    Raynaud disease    Scleroderma ([place])    Thyroid disease    hypothyroidism   Past Surgical History:  Procedure Laterality Date   BREAST ENHANCEMENT SURGERY  1975   BREAST IMPLANT REMOVAL Bilateral 04/23/2016   Procedure: REMOVALBILATERAL BREAST IMPLANTS;  Surgeon: Wallace Going, DO;  Location: [facility];  Service: Plastics;  Laterality: Bilateral;   CHOLECYSTECTOMY  1973   ESOPHAGOGASTRODUODENOSCOPY N/A 10/25/2020   Procedure: ESOPHAGOGASTRODUODENOSCOPY (EGD);  Surgeon: Clarene Essex, MD;  Location: [facility];  Service: Endoscopy;  Laterality: N/A;  enteroscopy   HAND SURGERY  08/2012; 11/2012   HOT HEMOSTASIS N/A 10/25/2020   Procedure: HOT HEMOSTASIS (ARGON PLASMA COAGULATION/BICAP);  Surgeon: Clarene Essex, MD;  Location: [facility];  Service: Endoscopy;  Laterality: N/A;   IR RADIOLOGIST EVAL & MGMT  07/27/2017   IR SACROPLASTY BILATERAL  07/31/2017  IR VERTEBROPLASTY CERV/THOR BX INC UNI/BIL INC/INJECT/IMAGING  03/13/2020   IR VERTEBROPLASTY EA ADDL (T&LS) BX INC UNI/BIL INC INJECT/IMAGING  03/14/2020   MELANOMA EXCISION     at 33 yrs of age   ORIF HIP FRACTURE Right 02/22/2014   Procedure: OPEN  REDUCTION INTERNAL FIXATION RIGHT HIP;  Surgeon: Sanjuana Kava, MD;  Location: [facility];  Service: Orthopedics;  Laterality: Right;   TOTAL ABDOMINAL HYSTERECTOMY  1974     SOCIAL HISTORY:  Social History   Socioeconomic History   Marital status: Married    Spouse name: Not on file   Number of children: 2   Years of education: Not on file   Highest education level: Some college, no degree  Occupational History   Occupation: retired Occupational psychologist at [employer]  Tobacco Use   Smoking status: Never   Smokeless tobacco: Never   Tobacco comments:    passive tobacco smoke exposure as child  Vaping Use   Vaping Use: Never used  Substance and Sexual Activity   Alcohol use: No   Drug use: No   Sexual activity: Not Currently    Birth control/protection: Surgical  Other Topics Concern   Not on file  Social History Narrative   Regular exercise: walking   Caffeine use: 1 cup of coffee daily   Social Determinants of Health   Financial Resource Strain: Low Risk    Difficulty of Paying Living Expenses: Not hard at all  Food Insecurity: No Food Insecurity   Worried About Charity fundraiser in the Last Year: Never true   [place] in the Last Year: Never true  Transportation Needs: No Transportation Needs   Lack of Transportation (Medical): No   Lack of Transportation (Non-Medical): No  Physical Activity: Inactive   Days of Exercise per Week: 0 days   Minutes of Exercise per Session: 0 min  Stress: Not on file  Social Connections: Socially Integrated   Frequency of Communication with Friends and Family: More than three times a week   Frequency of Social Gatherings with Friends and Family: Once a week   Attends Religious Services: 1 to 4 times per year   Active Member of [employer] or Organizations: Yes   Attends Archivist Meetings: Never   Marital Status: Married  Human resources officer Violence: Not At Risk   Fear of Current or Ex-Partner: No   Emotionally Abused: No    Physically Abused: No   Sexually Abused: No    FAMILY HISTORY:  Family History  Problem Relation Age of Onset   Pancreatic cancer Father    Raynaud syndrome Father    Cancer Father        pancreatic   Rashes / Skin problems Daughter        possibly scleraderma   Hip fracture Mother    Mental illness Mother        attempted suicide at 65 yo    CURRENT MEDICATIONS:  Outpatient Encounter Medications as of 04/01/2021  Medication Sig   acetaminophen (TYLENOL) 500 MG tablet Take 1,000 mg by mouth every 6 (six) hours as needed for moderate pain or headache.   CALCIUM PO Take by mouth.   Cholecalciferol (VITAMIN D) 50 MCG (2000 UT) tablet Take 2,000 Units by mouth daily.   clonazePAM (KLONOPIN) 0.5 MG tablet Take 1 tablet (0.5 mg total) by mouth 2 (two) times daily as needed for anxiety.   ezetimibe (ZETIA) 10 MG tablet Take 1 tablet (10 mg total) by  mouth daily.   FLUoxetine (PROZAC) 40 MG capsule Take 1 capsule (40 mg total) by mouth daily.   fluticasone (FLONASE) 50 MCG/ACT nasal spray PLACE 2 SPRAYS INTO BOTH NOSTRILS DAILY   HYDROcodone-acetaminophen (NORCO/VICODIN) 5-325 MG tablet Take 1 tablet by mouth daily as needed for moderate pain.   NIFEdipine (PROCARDIA XL/NIFEDICAL XL) 60 MG 24 hr tablet Take 1 tablet (60 mg total) by mouth daily.   omeprazole (PRILOSEC) 20 MG capsule TAKE 1 TABLET DAILY   PROLIA 60 MG/ML SOSY injection Inject 60 mg into the skin every 6 (six) months.   RESTASIS 0.05 % ophthalmic emulsion Place 1 drop into both eyes daily.   sildenafil (REVATIO) 20 MG tablet Take 1 tablet (20 mg total) by mouth 2 (two) times daily.   SYNTHROID 75 MCG tablet TAKE (1) TABLET DAILY BE- FORE BREAKFAST.   No facility-administered encounter medications on file as of 04/01/2021.    ALLERGIES:  Allergies  Allergen Reactions   Dilaudid [Hydromorphone Hcl] Anaphylaxis   Dilaudid [Hydromorphone] Anaphylaxis   Acyclovir And Related     Unknown reaction   Crestor [Rosuvastatin  Calcium] Other (See Comments)    Muscle pain   Gabapentin Swelling    Swelling in feet, and in legs   Aleve [Naproxen Sodium] Rash   Lyrica [Pregabalin] Other (See Comments)    Swelling in feet, and in legs     PHYSICAL EXAM:  ECOG PERFORMANCE STATUS: 1 - Symptomatic but completely ambulatory  There were no vitals filed for this visit. There were no vitals filed for this visit. Physical Exam Constitutional:      Appearance: Normal appearance.  HENT:     Head: Normocephalic and atraumatic.     Mouth/Throat:     Mouth: Mucous membranes are moist.  Eyes:     Extraocular Movements: Extraocular movements intact.     Pupils: Pupils are equal, round, and reactive to light.  Cardiovascular:     Rate and Rhythm: Normal rate and regular rhythm.     Pulses: Normal pulses.     Heart sounds: Murmur (Heart murmur best auscultated over right upper sternal border) heard.  Pulmonary:     Effort: Pulmonary effort is normal.     Breath sounds: Normal breath sounds.  Abdominal:     General: Bowel sounds are normal.     Palpations: Abdomen is soft.     Tenderness: There is no abdominal tenderness.  Musculoskeletal:        General: No swelling.     Right lower leg: No edema.     Left lower leg: No edema.  Lymphadenopathy:     Cervical: No cervical adenopathy.  Skin:    General: Skin is warm and dry.  Neurological:     General: No focal deficit present.     Mental Status: She is alert and oriented to person, place, and time.  Psychiatric:        Mood and Affect: Mood normal.        Behavior: Behavior normal.     LABORATORY DATA:  I have reviewed the labs as listed.  CBC    Component Value Date/Time   WBC 7.6 02/13/2021 1454   WBC 5.5 01/16/2021 1053   RBC 3.49 (L) 02/13/2021 1454   RBC 3.28 (L) 01/16/2021 1053   HGB 10.1 (L) 02/13/2021 1454   HCT 33.2 (L) 02/13/2021 1454   PLT 328 02/13/2021 1454   MCV 95 02/13/2021 1454   MCH 28.9 02/13/2021 1454   MCH  29.6 01/16/2021  1053   MCHC 30.4 (L) 02/13/2021 1454   MCHC 31.6 01/16/2021 1053   RDW 15.3 02/13/2021 1454   LYMPHSABS 1.3 02/13/2021 1454   MONOABS 0.5 01/16/2021 1053   EOSABS 0.3 02/13/2021 1454   BASOSABS 0.1 02/13/2021 1454   CMP Latest Ref Rng & Units 02/13/2021 01/16/2021 12/10/2020  Glucose 65 - 99 mg/dL 118(H) 108(H) 112(H)  BUN 8 - 27 mg/dL 11 19 26(H)  Creatinine 0.57 - 1.00 mg/dL 0.82 0.86 1.11(H)  Sodium 134 - 144 mmol/L 140 135 133(L)  Potassium 3.5 - 5.2 mmol/L 4.2 4.3 4.2  Chloride 96 - 106 mmol/L 108(H) 104 104  CO2 20 - 29 mmol/L 19(L) 24 19(L)  Calcium 8.7 - 10.3 mg/dL 8.0(L) 9.4 8.9  Total Protein 6.0 - 8.5 g/dL 6.6 6.7 7.6  Total Bilirubin 0.0 - 1.2 mg/dL <0.2 0.3 0.3  Alkaline Phos 44 - 121 IU/L 52 36(L) 38  AST 0 - 40 IU/L 13 16 17   ALT 0 - 32 IU/L 7 10 12     DIAGNOSTIC IMAGING:  I have independently reviewed the relevant imaging and discussed with the patient.  ASSESSMENT & PLAN: 1.  Iron deficiency anemia - Initially referred to hematology by PCP after labs obtained were 09/21/2020 significant for normocytic anemia (Hgb 10.2, MCV 83.0) and iron deficiency (ferritin 13) - EGD (10/25/2020): 2 angiodysplastic lesions without bleeding in the duodenum, another angiodysplastic lesion in duodenum; treated by [facility] - Capsule study (11/29/2020): At least 3 proximal and mid small bowel AVMs, nonbleeding, but also with some possible red material in the proximal colon - Other work-up: SPEP/IFE/light chains unremarkable; LDH normal; CMP unremarkable  - Blood transfusion: PRBC x1 on 12/14/2020 due to Hgb 7.0  - IV iron given in March, June, and July 2022  -most recently received IV Venofer with 300 mg x 3 from 01/28/2021 through 02/04/2021 - She reports that her dark stool/melena resolved in August 2022 - She continues to follow with GI, has appointment coming up on 05/03/2021. - Labs today (04/01/2021): Hgb 10.8 with MCV 91.7, ferritin 39, iron saturation 15% - She is symptomatic with  significant fatigue, which is previously improved after IV iron infusions - Differential diagnosis favors iron deficiency anemia secondary to chronic GI blood loss; may also be an aspect of malabsorption in the setting of scleroderma, low meat intake, and use of PPI - PLAN: Recommend additional IV iron with Venofer x3 (300-300-400).  RTC in 3 months with repeat labs, followed by phone visit.  Continue follow-up with gastroenterology for ongoing management of GI bleeding.   PLAN SUMMARY & DISPOSITION: -IV Venofer x3 ([phone]) - Labs in 3 months (CBC and iron panel) - Phone visit after labs  All questions were answered. The patient knows to call the clinic with any problems, questions or concerns.  Medical decision making: Low  Time spent on visit: I spent 20 minutes counseling the patient face to face. The total time spent in the appointment was 30 minutes and more than 50% was on counseling.   Harriett Rush, PA-C  04/01/2021 8:49 PM

## 2021-04-01 ENCOUNTER — Other Ambulatory Visit: Payer: Self-pay

## 2021-04-01 ENCOUNTER — Inpatient Hospital Stay (HOSPITAL_COMMUNITY): Payer: Medicare Other | Attending: Physician Assistant | Admitting: Physician Assistant

## 2021-04-01 ENCOUNTER — Inpatient Hospital Stay (HOSPITAL_COMMUNITY): Payer: Medicare Other

## 2021-04-01 VITALS — BP 140/53 | HR 70 | Temp 98.2°F | Resp 20 | Wt 108.0 lb

## 2021-04-01 DIAGNOSIS — R0609 Other forms of dyspnea: Secondary | ICD-10-CM | POA: Insufficient documentation

## 2021-04-01 DIAGNOSIS — Z8269 Family history of other diseases of the musculoskeletal system and connective tissue: Secondary | ICD-10-CM | POA: Diagnosis not present

## 2021-04-01 DIAGNOSIS — Z800 Family history of malignant neoplasm of digestive organs: Secondary | ICD-10-CM | POA: Diagnosis not present

## 2021-04-01 DIAGNOSIS — D500 Iron deficiency anemia secondary to blood loss (chronic): Secondary | ICD-10-CM

## 2021-04-01 DIAGNOSIS — K922 Gastrointestinal hemorrhage, unspecified: Secondary | ICD-10-CM | POA: Diagnosis not present

## 2021-04-01 DIAGNOSIS — E785 Hyperlipidemia, unspecified: Secondary | ICD-10-CM | POA: Diagnosis not present

## 2021-04-01 DIAGNOSIS — Z79899 Other long term (current) drug therapy: Secondary | ICD-10-CM | POA: Insufficient documentation

## 2021-04-01 DIAGNOSIS — E039 Hypothyroidism, unspecified: Secondary | ICD-10-CM | POA: Diagnosis not present

## 2021-04-01 DIAGNOSIS — Z840 Family history of diseases of the skin and subcutaneous tissue: Secondary | ICD-10-CM | POA: Diagnosis not present

## 2021-04-01 DIAGNOSIS — F32A Depression, unspecified: Secondary | ICD-10-CM | POA: Diagnosis not present

## 2021-04-01 DIAGNOSIS — I10 Essential (primary) hypertension: Secondary | ICD-10-CM | POA: Insufficient documentation

## 2021-04-01 DIAGNOSIS — R5383 Other fatigue: Secondary | ICD-10-CM | POA: Insufficient documentation

## 2021-04-01 DIAGNOSIS — G479 Sleep disorder, unspecified: Secondary | ICD-10-CM | POA: Diagnosis not present

## 2021-04-01 DIAGNOSIS — R0602 Shortness of breath: Secondary | ICD-10-CM | POA: Diagnosis not present

## 2021-04-01 LAB — COMPREHENSIVE METABOLIC PANEL
ALT: 11 U/L (ref 0–44)
AST: 18 U/L (ref 15–41)
Albumin: 3.8 g/dL (ref 3.5–5.0)
Alkaline Phosphatase: 44 U/L (ref 38–126)
Anion gap: 8 (ref 5–15)
BUN: 11 mg/dL (ref 8–23)
CO2: 24 mmol/L (ref 22–32)
Calcium: 8.9 mg/dL (ref 8.9–10.3)
Chloride: 102 mmol/L (ref 98–111)
Creatinine, Ser: 0.91 mg/dL (ref 0.44–1.00)
GFR, Estimated: >60 mL/min (ref >60)
Glucose, Bld: 100 mg/dL — ABNORMAL HIGH (ref 70–99)
Potassium: 4 mmol/L (ref 3.5–5.1)
Sodium: 134 mmol/L — ABNORMAL LOW (ref 135–145)
Total Bilirubin: 0.1 mg/dL — ABNORMAL LOW (ref 0.3–1.2)
Total Protein: 7.1 g/dL (ref 6.5–8.1)

## 2021-04-01 LAB — CBC WITH DIFFERENTIAL/PLATELET
Abs Immature Granulocytes: 0.02 10*3/uL (ref 0.00–0.07)
Basophils Absolute: 0.1 10*3/uL (ref 0.0–0.1)
Basophils Relative: 1 %
Eosinophils Absolute: 0.2 10*3/uL (ref 0.0–0.5)
Eosinophils Relative: 3 %
HCT: 33.1 % — ABNORMAL LOW (ref 36.0–46.0)
Hemoglobin: 10.8 g/dL — ABNORMAL LOW (ref 12.0–15.0)
Immature Granulocytes: 0 %
Lymphocytes Relative: 22 %
Lymphs Abs: 1.5 10*3/uL (ref 0.7–4.0)
MCH: 29.9 pg (ref 26.0–34.0)
MCHC: 32.6 g/dL (ref 30.0–36.0)
MCV: 91.7 fL (ref 80.0–100.0)
Monocytes Absolute: 0.6 10*3/uL (ref 0.1–1.0)
Monocytes Relative: 8 %
Neutro Abs: 4.5 10*3/uL (ref 1.7–7.7)
Neutrophils Relative %: 66 %
Platelets: 300 10*3/uL (ref 150–400)
RBC: 3.61 MIL/uL — ABNORMAL LOW (ref 3.87–5.11)
RDW: 14.7 % (ref 11.5–15.5)
WBC: 6.8 10*3/uL (ref 4.0–10.5)
nRBC: 0 % (ref 0.0–0.2)

## 2021-04-01 LAB — IRON AND TIBC
Iron: 54 ug/dL (ref 28–170)
Saturation Ratios: 15 % (ref 10.4–31.8)
TIBC: 350 ug/dL (ref 250–450)
UIBC: 296 ug/dL

## 2021-04-01 LAB — FERRITIN: Ferritin: 39 ng/mL (ref 11–307)

## 2021-04-01 NOTE — Patient Instructions (Signed)
[place] at [facility] Discharge Instructions  You were seen today by Tarri Abernethy PA-C for your iron deficiency anemia.  We will schedule you for IV iron x 3 doses and see you for follow-up in 3 months.  LABS: Return in 3 months for repeat labs   OTHER TESTS: None at this time  MEDICATIONS: IV iron x 3 doses  FOLLOW-UP APPOINTMENT: Phone visit in 3 months, after labs   Thank you for choosing [facility] at [facility] to provide your oncology and hematology care.  To afford each patient quality time with our provider, please arrive at least 15 minutes before your scheduled appointment time.   If you have a lab appointment with the [facility] please come in thru the Main Entrance and check in at the main information desk.  You need to re-schedule your appointment should you arrive 10 or more minutes late.  We strive to give you quality time with our providers, and arriving late affects you and other patients whose appointments are after yours.  Also, if you no show three or more times for appointments you may be dismissed from the clinic at the providers discretion.     Again, thank you for choosing [facility].  Our hope is that these requests will decrease the amount of time that you wait before being seen by our physicians.       _____________________________________________________________  Should you have questions after your visit to [facility], please contact our office at [phone] and follow the prompts.  Our office hours are 8:00 a.m. and 4:30 p.m. Monday - Friday.  Please note that voicemails left after 4:00 p.m. may not be returned until the following business day.  We are closed weekends and major holidays.  You do have access to a nurse 24-7, just call the main number to the clinic [phone] and do not press any options, hold on the line and a nurse will answer the phone.    For  prescription refill requests, have your pharmacy contact our office and allow 72 hours.    Due to Covid, you will need to wear a mask upon entering the hospital. If you do not have a mask, a mask will be given to you at the Main Entrance upon arrival. For doctor visits, patients may have 1 support person age 79 or older with them. For treatment visits, patients can not have anyone with them due to social distancing guidelines and our immunocompromised population.

## 2021-04-03 ENCOUNTER — Other Ambulatory Visit: Payer: Self-pay

## 2021-04-03 ENCOUNTER — Inpatient Hospital Stay (HOSPITAL_COMMUNITY): Payer: Medicare Other

## 2021-04-03 VITALS — BP 119/50 | HR 73 | Temp 97.9°F | Resp 18

## 2021-04-03 DIAGNOSIS — R0602 Shortness of breath: Secondary | ICD-10-CM | POA: Diagnosis not present

## 2021-04-03 DIAGNOSIS — D500 Iron deficiency anemia secondary to blood loss (chronic): Secondary | ICD-10-CM

## 2021-04-03 DIAGNOSIS — R0609 Other forms of dyspnea: Secondary | ICD-10-CM | POA: Diagnosis not present

## 2021-04-03 DIAGNOSIS — K922 Gastrointestinal hemorrhage, unspecified: Secondary | ICD-10-CM | POA: Diagnosis not present

## 2021-04-03 DIAGNOSIS — G479 Sleep disorder, unspecified: Secondary | ICD-10-CM | POA: Diagnosis not present

## 2021-04-03 DIAGNOSIS — R5383 Other fatigue: Secondary | ICD-10-CM | POA: Diagnosis not present

## 2021-04-03 MED ORDER — ACETAMINOPHEN 325 MG PO TABS
650.0000 mg | ORAL_TABLET | Freq: Once | ORAL | Status: AC
  Administered 2021-04-03: 650 mg via ORAL
  Filled 2021-04-03: qty 2

## 2021-04-03 MED ORDER — SODIUM CHLORIDE 0.9 % IV SOLN: Freq: Once | INTRAVENOUS | Status: AC

## 2021-04-03 MED ORDER — SODIUM CHLORIDE 0.9 % IV SOLN
300.0000 mg | Freq: Once | INTRAVENOUS | Status: AC
  Administered 2021-04-03: 300 mg via INTRAVENOUS
  Filled 2021-04-03: qty 10

## 2021-04-03 MED ORDER — LORATADINE 10 MG PO TABS
10.0000 mg | ORAL_TABLET | Freq: Once | ORAL | Status: AC
  Administered 2021-04-03: 10 mg via ORAL
  Filled 2021-04-03: qty 1

## 2021-04-03 NOTE — Progress Notes (Signed)
Pt presents today for Venofer IV iron infusion per provider's order. Vital signs stable and pt voiced no new complaints at this time.  Peripheral IV started with good blood return pre and post infusion.  Venofer given today per MD orders. Tolerated infusion without adverse affects. Vital signs stable. No complaints at this time. [name] Alert and oriented x 3. F/U with [facility] as scheduled.    

## 2021-04-03 NOTE — Patient Instructions (Signed)
[facility]  Discharge Instructions: Thank you for choosing [facility] to provide your oncology and hematology care.  If you have a lab appointment with the Cancer Center, please come in thru the Main Entrance and check in at the main information desk.  Wear comfortable clothing and clothing appropriate for easy access to any Portacath or PICC line.   We strive to give you quality time with your provider. You may need to reschedule your appointment if you arrive late (15 or more minutes).  Arriving late affects you and other patients whose appointments are after yours.  Also, if you miss three or more appointments without notifying the office, you may be dismissed from the clinic at the provider's discretion.      For prescription refill requests, have your pharmacy contact our office and allow 72 hours for refills to be completed.    Today you received Feraheme.    BELOW ARE SYMPTOMS THAT SHOULD BE REPORTED IMMEDIATELY: *FEVER GREATER THAN 100.4 F (38 C) OR HIGHER *CHILLS OR SWEATING *NAUSEA AND VOMITING THAT IS NOT CONTROLLED WITH YOUR NAUSEA MEDICATION *UNUSUAL SHORTNESS OF BREATH *UNUSUAL BRUISING OR BLEEDING *URINARY PROBLEMS (pain or burning when urinating, or frequent urination) *BOWEL PROBLEMS (unusual diarrhea, constipation, pain near the anus) TENDERNESS IN MOUTH AND THROAT WITH OR WITHOUT PRESENCE OF ULCERS (sore throat, sores in mouth, or a toothache) UNUSUAL RASH, SWELLING OR PAIN  UNUSUAL VAGINAL DISCHARGE OR ITCHING   Items with * indicate a potential emergency and should be followed up as soon as possible or go to the Emergency Department if any problems should occur.  Please show the CHEMOTHERAPY ALERT CARD or IMMUNOTHERAPY ALERT CARD at check-in to the Emergency Department and triage nurse.  Should you have questions after your visit or need to cancel or reschedule your appointment, please contact [facility] [phone]  and  follow the prompts.  Office hours are 8:00 a.m. to 4:30 p.m. Monday - Friday. Please note that voicemails left after 4:00 p.m. may not be returned until the following business day.  We are closed weekends and major holidays. You have access to a nurse at all times for urgent questions. Please call the main number to the clinic [phone] and follow the prompts.  For any non-urgent questions, you may also contact your provider using MyChart. We now offer e-Visits for anyone 18 and older to request care online for non-urgent symptoms. For details visit [email or web address].   Also download the MyChart app! Go to the app store, search "MyChart", open the app, select [facility], and log in with your MyChart username and password.  Due to Covid, a mask is required upon entering the hospital/clinic. If you do not have a mask, one will be given to you upon arrival. For doctor visits, patients may have 1 support person aged 18 or older with them. For treatment visits, patients cannot have anyone with them due to current Covid guidelines and our immunocompromised population.  

## 2021-04-08 ENCOUNTER — Ambulatory Visit (INDEPENDENT_AMBULATORY_CARE_PROVIDER_SITE_OTHER): Payer: Medicare Other | Admitting: Licensed Clinical Social Worker

## 2021-04-08 DIAGNOSIS — I7300 Raynaud's syndrome without gangrene: Secondary | ICD-10-CM

## 2021-04-08 DIAGNOSIS — M349 Systemic sclerosis, unspecified: Secondary | ICD-10-CM

## 2021-04-08 DIAGNOSIS — I10 Essential (primary) hypertension: Secondary | ICD-10-CM

## 2021-04-08 DIAGNOSIS — E785 Hyperlipidemia, unspecified: Secondary | ICD-10-CM

## 2021-04-08 DIAGNOSIS — K219 Gastro-esophageal reflux disease without esophagitis: Secondary | ICD-10-CM

## 2021-04-08 DIAGNOSIS — F3342 Major depressive disorder, recurrent, in full remission: Secondary | ICD-10-CM

## 2021-04-08 DIAGNOSIS — E039 Hypothyroidism, unspecified: Secondary | ICD-10-CM

## 2021-04-08 DIAGNOSIS — M810 Age-related osteoporosis without current pathological fracture: Secondary | ICD-10-CM

## 2021-04-08 DIAGNOSIS — M159 Polyosteoarthritis, unspecified: Secondary | ICD-10-CM

## 2021-04-08 NOTE — Patient Instructions (Signed)
Visit Information  PATIENT GOALS:  Goals Addressed               This Visit's Progress     Manage Anxiety and Stress issues faced; Manage depression issues faced (pt-stated)         Timeframe: Short-Term Goal  This Visit's Progress: On Track Priority : High    Start Date: 04/08/21  Expected End Date 07/03/21  Completed Date  Follow Up Date:  05/28/21  3:30 PM  Manage Anxiety and stress issues faced; manage depression issues faced  Patient Self Care Activities:  Completes ADLs daily Takes Medications as prescribed  Patient Coping Strengths:  Family support Communicates needs to spouse or family members Self Advocates as needed  Patient Self Care Deficits:  Anxiety or Stress Challenges  Patient Goals:  - spend time or talk with others every day - practice relaxation or meditation daily - keep a calendar with appointment dates  Follow Up Plan: LCSW to call client or Arbie Cookey, daughter of client on 05/28/21 at 3:30 PM to assess client needs      Norva Riffle.Nyiah Pianka MSW, LCSW Licensed Clinical Social Worker [facility] Care Management [phone]

## 2021-04-08 NOTE — Chronic Care Management (AMB) (Signed)
Chronic Care Management    Clinical Social Work Note  04/08/2021 Name: Haley Trevino MRN: [id number] DOB: 1940-08-31  [name] is a 80 y.o. year old female who is a primary care patient of [name], [place]. The CCM team was consulted to assist the patient with chronic disease management and/or care coordination needs related to: [name] .   Engaged with patient /daughter of patient, Gardiner Fanti, by telephone for follow up visit in response to provider referral for social work chronic care management and care coordination services.   Consent to Services:  The patient was given information about Chronic Care Management services, agreed to services, and gave verbal consent prior to initiation of services.  Please see initial visit note for detailed documentation.   Patient agreed to services and consent obtained.   Assessment: Review of patient past medical history, allergies, medications, and health status, including review of relevant consultants reports was performed today as part of a comprehensive evaluation and provision of chronic care management and care coordination services.     SDOH (Social Determinants of Health) assessments and interventions performed:  SDOH Interventions    Flowsheet Row Most Recent Value  SDOH Interventions   Physical Activity Interventions Other (Comments)  [client has difficulty in standing.]  Stress Interventions Provide Counseling  [client has stress related to managing her medical condtions]  Depression Interventions/Treatment  --  [name], daughter of client, of LCSW support for client and of RNCM support for client.]        Advanced Directives Status: See [employer] application for related entries.  CCM Care Plan  Allergies  Allergen Reactions   Dilaudid [Hydromorphone Hcl] Anaphylaxis   Dilaudid [Hydromorphone] Anaphylaxis   Acyclovir And Related     Unknown reaction   Crestor [Rosuvastatin  Calcium] Other (See Comments)    Muscle pain   Gabapentin Swelling    Swelling in feet, and in legs   Aleve [Naproxen Sodium] Rash   Lyrica [Pregabalin] Other (See Comments)    Swelling in feet, and in legs    Outpatient Encounter Medications as of 04/08/2021  Medication Sig   acetaminophen (TYLENOL) 500 MG tablet Take 1,000 mg by mouth every 6 (six) hours as needed for moderate pain or headache.   CALCIUM PO Take by mouth.   Cholecalciferol (VITAMIN D) 50 MCG (2000 UT) tablet Take 2,000 Units by mouth daily.   clonazePAM (KLONOPIN) 0.5 MG tablet Take 1 tablet (0.5 mg total) by mouth 2 (two) times daily as needed for anxiety.   ezetimibe (ZETIA) 10 MG tablet Take 1 tablet (10 mg total) by mouth daily.   FLUoxetine (PROZAC) 40 MG capsule Take 1 capsule (40 mg total) by mouth daily.   fluticasone (FLONASE) 50 MCG/ACT nasal spray PLACE 2 SPRAYS INTO BOTH NOSTRILS DAILY   NIFEdipine (PROCARDIA XL/NIFEDICAL XL) 60 MG 24 hr tablet Take 1 tablet (60 mg total) by mouth daily.   omeprazole (PRILOSEC) 20 MG capsule TAKE 1 TABLET DAILY   PROLIA 60 MG/ML SOSY injection Inject 60 mg into the skin every 6 (six) months.   RESTASIS 0.05 % ophthalmic emulsion Place 1 drop into both eyes daily.   sildenafil (REVATIO) 20 MG tablet Take 1 tablet (20 mg total) by mouth 2 (two) times daily.   SYNTHROID 75 MCG tablet TAKE (1) TABLET DAILY BE- FORE BREAKFAST.   No facility-administered encounter medications on file as of 04/08/2021.    Patient Active Problem List   Diagnosis Date Noted   History  of adenomatous polyp of colon 09/21/2020   Weight loss 09/21/2020   Irritable bowel syndrome 09/21/2020   Breast implant rupture 09/04/2020   Iron deficiency anemia due to chronic blood loss 08/28/2020   Pain in right shoulder 04/11/2020   Murmur 11/18/2017   Impingement syndrome of left shoulder region 12/08/2014   Osteoporosis 09/20/2014   Hip fracture requiring operative repair [facility]) 02/22/2014    Hypothyroidism 11/29/2013   Depression 11/29/2013   GERD (gastroesophageal reflux disease) 11/29/2013   Hypertension 11/29/2013   Hyperlipidemia with target LDL less than 100 11/29/2013   Thyroid cyst 04/26/2013   MALIGNANT MELANOMA, SKIN 08/07/2010   [name] 08/06/2010   SCLERODERMA 08/06/2010   Osteoarthritis 08/06/2010    Conditions to be addressed/monitored: monitor client management of anxiety/stress issues; monitor client management of depression issues  Care Plan : LCSW Care Plan  Updates made by Haley Cabal, LCSW since 04/08/2021 12:00 AM     Problem: Anxiety Identification (Anxiety)      Goal: Anxiety Symptoms Identified;Manage anxiety symptoms   Start Date: 04/08/2021  Expected End Date: 07/03/2021  This Visit's Progress: On track  Recent Progress: On track  Priority: High  Note:   Current Barriers:  Anxiety issues Decreased energy Suicidal Ideation/Homicidal Ideation: No  Clinical Social Work Goal(s):  patient will work with SW monthly by telephone or in person to reduce or manage symptoms related to anxiety or stress issues faced Attend scheduled medical appointments in next 30 days Client or daughter of client will call RNCM as needed in next 30 days for CCM nursing support  Interventions: 1:1 collaboration with [name], FNP regarding development and update of comprehensive plan of care as evidenced by provider attestation and co-signature Discussed with Gardiner Fanti, daughter of client, the recent iron infusions of client.  Discussed with Arbie Cookey the energy level of client Reviewed with Arbie Cookey  client appetite and food intake.  Arbie Cookey said client eats only small meals and she feels client has reduced muscle mass.  Arbie Cookey said client uses supplements regularly .  Reviewed with Arbie Cookey client support from [place] regarding client qualify of life. Arbie Cookey feels that client quality of life has  decreased. Provided counseling support for Arbie Cookey regarding current needs of client Haley Trevino related to mood of client.  Arbie Cookey feels that client does get depressed occasionally.  Arbie Cookey said client had been taking prescribed Prozac for many years. Encouraged client or Arbie Cookey to call RNCM as needed for nursing support for client Discussed with Arbie Cookey client difficulty in standing Collaborated with RNCM about current needs of client  Patient Self Care Activities:  Completes ADLs daily Takes Medications as prescribed  Patient Coping Strengths:  Family support Communicates needs to spouse or family members Self Advocates as needed  Patient Self Care Deficits:  Anxiety or Stress Challenges  Patient Goals:  - spend time or talk with others every day - practice relaxation or meditation daily - keep a calendar with appointment dates  Follow Up Plan: LCSW to call client or Gardiner Fanti, daughter of client on 05/28/21 at 3:30 PM to assess needs of client       Norva Riffle.Kissa Campoy MSW, LCSW Licensed Clinical Social Worker [facility] Care Management [phone]

## 2021-04-10 ENCOUNTER — Telehealth: Payer: Self-pay

## 2021-04-10 ENCOUNTER — Other Ambulatory Visit: Payer: Self-pay

## 2021-04-10 ENCOUNTER — Inpatient Hospital Stay (HOSPITAL_COMMUNITY): Payer: Medicare Other | Attending: Hematology

## 2021-04-10 VITALS — BP 119/58 | HR 71 | Temp 96.8°F | Resp 17 | Wt 111.6 lb

## 2021-04-10 DIAGNOSIS — D500 Iron deficiency anemia secondary to blood loss (chronic): Secondary | ICD-10-CM

## 2021-04-10 DIAGNOSIS — D509 Iron deficiency anemia, unspecified: Secondary | ICD-10-CM | POA: Diagnosis not present

## 2021-04-10 MED ORDER — ACETAMINOPHEN 325 MG PO TABS
650.0000 mg | ORAL_TABLET | Freq: Once | ORAL | Status: AC
  Administered 2021-04-10: 650 mg via ORAL
  Filled 2021-04-10: qty 2

## 2021-04-10 MED ORDER — LORATADINE 10 MG PO TABS
10.0000 mg | ORAL_TABLET | Freq: Once | ORAL | Status: AC
  Administered 2021-04-10: 10 mg via ORAL
  Filled 2021-04-10: qty 1

## 2021-04-10 MED ORDER — SODIUM CHLORIDE 0.9 % IV SOLN
300.0000 mg | Freq: Once | INTRAVENOUS | Status: AC
  Administered 2021-04-10: 300 mg via INTRAVENOUS
  Filled 2021-04-10: qty 300

## 2021-04-10 MED ORDER — SODIUM CHLORIDE 0.9 % IV SOLN: Freq: Once | INTRAVENOUS | Status: AC

## 2021-04-10 NOTE — Progress Notes (Signed)
Patient presents today for Venofer iron infusion.  Patient is in satisfactory condition with no new complaints today.  Vitals signs are stable.  We will proceed with treatment per MD orders.   Patient tolerated treatment well with no complaints voiced.  Patient left ambulatory in stable condition.  Vital signs stable at discharge.  Follow up as scheduled.

## 2021-04-10 NOTE — Patient Instructions (Signed)
[facility]  Discharge Instructions: Thank you for choosing [facility] to provide your oncology and hematology care.  If you have a lab appointment with the Cancer Center, please come in thru the Main Entrance and check in at the main information desk.  Wear comfortable clothing and clothing appropriate for easy access to any Portacath or PICC line.   We strive to give you quality time with your provider. You may need to reschedule your appointment if you arrive late (15 or more minutes).  Arriving late affects you and other patients whose appointments are after yours.  Also, if you miss three or more appointments without notifying the office, you may be dismissed from the clinic at the provider's discretion.      For prescription refill requests, have your pharmacy contact our office and allow 72 hours for refills to be completed.        To help prevent nausea and vomiting after your treatment, we encourage you to take your nausea medication as directed.  BELOW ARE SYMPTOMS THAT SHOULD BE REPORTED IMMEDIATELY: *FEVER GREATER THAN 100.4 F (38 C) OR HIGHER *CHILLS OR SWEATING *NAUSEA AND VOMITING THAT IS NOT CONTROLLED WITH YOUR NAUSEA MEDICATION *UNUSUAL SHORTNESS OF BREATH *UNUSUAL BRUISING OR BLEEDING *URINARY PROBLEMS (pain or burning when urinating, or frequent urination) *BOWEL PROBLEMS (unusual diarrhea, constipation, pain near the anus) TENDERNESS IN MOUTH AND THROAT WITH OR WITHOUT PRESENCE OF ULCERS (sore throat, sores in mouth, or a toothache) UNUSUAL RASH, SWELLING OR PAIN  UNUSUAL VAGINAL DISCHARGE OR ITCHING   Items with * indicate a potential emergency and should be followed up as soon as possible or go to the Emergency Department if any problems should occur.  Please show the CHEMOTHERAPY ALERT CARD or IMMUNOTHERAPY ALERT CARD at check-in to the Emergency Department and triage nurse.  Should you have questions after your visit or need to cancel  or reschedule your appointment, please contact [facility] [phone]  and follow the prompts.  Office hours are 8:00 a.m. to 4:30 p.m. Monday - Friday. Please note that voicemails left after 4:00 p.m. may not be returned until the following business day.  We are closed weekends and major holidays. You have access to a nurse at all times for urgent questions. Please call the main number to the clinic [phone] and follow the prompts.  For any non-urgent questions, you may also contact your provider using MyChart. We now offer e-Visits for anyone 18 and older to request care online for non-urgent symptoms. For details visit [email or web address].   Also download the MyChart app! Go to the app store, search "MyChart", open the app, select [facility], and log in with your MyChart username and password.  Due to Covid, a mask is required upon entering the hospital/clinic. If you do not have a mask, one will be given to you upon arrival. For doctor visits, patients may have 1 support person aged 18 or older with them. For treatment visits, patients cannot have anyone with them due to current Covid guidelines and our immunocompromised population.  

## 2021-04-10 NOTE — Chronic Care Management (AMB) (Signed)
  Care Management   Note  04/10/2021 Name: Haley Trevino MRN: [id number] DOB: 1940/12/09  Haley Trevino is a 80 y.o. year old female who is a primary care patient of [name], [place] and is actively engaged with the care management team. I reached out to [name] by phone today to assist with re-scheduling a follow up visit with the RN Case Manager  Follow up plan: Unsuccessful telephone outreach attempt made. A HIPAA compliant phone message was left for the patient providing contact information and requesting a return call.  The care management team will reach out to the patient again over the next 7 days.  If patient returns call to provider office, please advise to call [facility] at [place], [place], [place], [place] [address] Direct Dial: [phone] [email or web address] Website: [email or web address]

## 2021-04-11 NOTE — Chronic Care Management (AMB) (Signed)
  Care Management   Note  04/11/2021 Name: KLOE OATES MRN: [id number] DOB: 1941-03-16  NAVDEEP FESSENDEN is a 80 y.o. year old female who is a primary care patient of [name], [place] and is actively engaged with the care management team. I reached out to [name] by phone today to assist with re-scheduling a follow up visit with the RN Case Manager  Follow up plan: Telephone appointment with care management team member scheduled for:04/15/2021  Noreene Larsson, [place], [place], [place] [address] Direct Dial: [phone] [email or web address] Website: [email or web address]

## 2021-04-15 ENCOUNTER — Telehealth: Payer: Self-pay | Admitting: *Deleted

## 2021-04-15 ENCOUNTER — Telehealth: Payer: Medicare Other | Admitting: *Deleted

## 2021-04-15 NOTE — Telephone Encounter (Signed)
  Care Management   Follow Up Note   04/15/2021 Name: Haley Trevino MRN: [id number] DOB: December 26, 1940   Referred by: [name], FNP Reason for referral : No chief complaint on file.   An unsuccessful telephone outreach was attempted today. The patient was referred to the case management team for assistance with care management and care coordination.   Follow Up Plan: A HIPPA compliant phone message was left for the patient providing contact information and requesting a return call.  Forwarding to [facility] Care Guide for outreach and rescheduling.   Chong Sicilian, BSN, RN-BC Embedded Chronic Care Manager [facility] [place] Management Direct Dial: [phone]

## 2021-04-17 ENCOUNTER — Inpatient Hospital Stay (HOSPITAL_COMMUNITY): Payer: Medicare Other

## 2021-04-17 ENCOUNTER — Other Ambulatory Visit: Payer: Self-pay

## 2021-04-17 VITALS — BP 130/61 | HR 74 | Temp 97.1°F | Resp 17

## 2021-04-17 DIAGNOSIS — D509 Iron deficiency anemia, unspecified: Secondary | ICD-10-CM | POA: Diagnosis not present

## 2021-04-17 DIAGNOSIS — D500 Iron deficiency anemia secondary to blood loss (chronic): Secondary | ICD-10-CM

## 2021-04-17 MED ORDER — LORATADINE 10 MG PO TABS
10.0000 mg | ORAL_TABLET | Freq: Once | ORAL | Status: AC
  Administered 2021-04-17: 10 mg via ORAL
  Filled 2021-04-17: qty 1

## 2021-04-17 MED ORDER — ACETAMINOPHEN 325 MG PO TABS
650.0000 mg | ORAL_TABLET | Freq: Once | ORAL | Status: AC
  Administered 2021-04-17: 650 mg via ORAL
  Filled 2021-04-17: qty 2

## 2021-04-17 MED ORDER — SODIUM CHLORIDE 0.9 % IV SOLN: Freq: Once | INTRAVENOUS | Status: AC

## 2021-04-17 MED ORDER — SODIUM CHLORIDE 0.9 % IV SOLN
400.0000 mg | Freq: Once | INTRAVENOUS | Status: AC
  Administered 2021-04-17: 400 mg via INTRAVENOUS
  Filled 2021-04-17: qty 20

## 2021-04-17 NOTE — Progress Notes (Signed)
Pt presents today for Venofer IV iron infusion per provider's order. Vital signs stable and pt voiced no new complaints at this time.  Peripheral IV started with good blood return pre and post infusion.  Venofer given today per MD orders. Tolerated infusion without adverse affects. Vital signs stable. No complaints at this time. [name] Alert and oriented x 3. F/U with [facility] as scheduled.    

## 2021-04-17 NOTE — Patient Instructions (Signed)
[facility]  Discharge Instructions: Thank you for choosing [facility] to provide your oncology and hematology care.  If you have a lab appointment with the Cancer Center, please come in thru the Main Entrance and check in at the main information desk.  Wear comfortable clothing and clothing appropriate for easy access to any Portacath or PICC line.   We strive to give you quality time with your provider. You may need to reschedule your appointment if you arrive late (15 or more minutes).  Arriving late affects you and other patients whose appointments are after yours.  Also, if you miss three or more appointments without notifying the office, you may be dismissed from the clinic at the provider's discretion.      For prescription refill requests, have your pharmacy contact our office and allow 72 hours for refills to be completed.    Today you received Venofer IV iron infusion.     BELOW ARE SYMPTOMS THAT SHOULD BE REPORTED IMMEDIATELY: *FEVER GREATER THAN 100.4 F (38 C) OR HIGHER *CHILLS OR SWEATING *NAUSEA AND VOMITING THAT IS NOT CONTROLLED WITH YOUR NAUSEA MEDICATION *UNUSUAL SHORTNESS OF BREATH *UNUSUAL BRUISING OR BLEEDING *URINARY PROBLEMS (pain or burning when urinating, or frequent urination) *BOWEL PROBLEMS (unusual diarrhea, constipation, pain near the anus) TENDERNESS IN MOUTH AND THROAT WITH OR WITHOUT PRESENCE OF ULCERS (sore throat, sores in mouth, or a toothache) UNUSUAL RASH, SWELLING OR PAIN  UNUSUAL VAGINAL DISCHARGE OR ITCHING   Items with * indicate a potential emergency and should be followed up as soon as possible or go to the Emergency Department if any problems should occur.  Please show the CHEMOTHERAPY ALERT CARD or IMMUNOTHERAPY ALERT CARD at check-in to the Emergency Department and triage nurse.  Should you have questions after your visit or need to cancel or reschedule your appointment, please contact [facility]  [phone]  and follow the prompts.  Office hours are 8:00 a.m. to 4:30 p.m. Monday - Friday. Please note that voicemails left after 4:00 p.m. may not be returned until the following business day.  We are closed weekends and major holidays. You have access to a nurse at all times for urgent questions. Please call the main number to the clinic [phone] and follow the prompts.  For any non-urgent questions, you may also contact your provider using MyChart. We now offer e-Visits for anyone 18 and older to request care online for non-urgent symptoms. For details visit [email or web address].   Also download the MyChart app! Go to the app store, search "MyChart", open the app, select , and log in with your MyChart username and password.  Due to Covid, a mask is required upon entering the hospital/clinic. If you do not have a mask, one will be given to you upon arrival. For doctor visits, patients may have 1 support person aged 18 or older with them. For treatment visits, patients cannot have anyone with them due to current Covid guidelines and our immunocompromised population.  

## 2021-04-18 ENCOUNTER — Other Ambulatory Visit: Payer: Self-pay | Admitting: Nurse Practitioner

## 2021-04-18 ENCOUNTER — Telehealth: Payer: Self-pay | Admitting: Nurse Practitioner

## 2021-04-18 ENCOUNTER — Ambulatory Visit
Admission: RE | Admit: 2021-04-18 | Discharge: 2021-04-18 | Disposition: A | Discharge status: Home / Self Care | Payer: Medicare Other | Source: Ambulatory Visit | Attending: Nurse Practitioner | Admitting: Nurse Practitioner

## 2021-04-18 ENCOUNTER — Ambulatory Visit: Admission: RE | Admit: 2021-04-18 | Payer: Medicare Other | Source: Ambulatory Visit

## 2021-04-18 DIAGNOSIS — Z9886 Personal history of breast implant removal: Secondary | ICD-10-CM

## 2021-04-18 DIAGNOSIS — N644 Mastodynia: Secondary | ICD-10-CM

## 2021-04-18 DIAGNOSIS — R922 Inconclusive mammogram: Secondary | ICD-10-CM | POA: Diagnosis not present

## 2021-04-19 ENCOUNTER — Ambulatory Visit (INDEPENDENT_AMBULATORY_CARE_PROVIDER_SITE_OTHER): Payer: Medicare Other

## 2021-04-19 ENCOUNTER — Other Ambulatory Visit: Payer: Self-pay

## 2021-04-19 DIAGNOSIS — Z23 Encounter for immunization: Secondary | ICD-10-CM

## 2021-04-24 ENCOUNTER — Telehealth: Payer: Self-pay

## 2021-04-24 NOTE — Chronic Care Management (AMB) (Signed)
  Care Management   Note  04/24/2021 Name: Haley Trevino MRN: [id number] DOB: [birth year]  Haley Trevino is a 80 y.o. year old female who is a primary care patient of [name], [place] and is actively engaged with the care management team. I reached out to [name] by phone today to assist with re-scheduling a follow up visit with the RN Case Manager  Follow up plan: Unsuccessful telephone outreach attempt made. A HIPAA compliant phone message was left for the patient providing contact information and requesting a return call.  The care management team will reach out to the patient again over the next 7 days.  If patient returns call to provider office, please advise to call [facility]  at [place], [place], [place], [place] [address] Direct Dial: [phone] [email or web address]: [email or web address]

## 2021-04-25 NOTE — Chronic Care Management (AMB) (Signed)
  Care Management   Note  04/25/2021 Name: Haley Trevino MRN: [id number] DOB: 1941-04-19  Haley Trevino is a 80 y.o. year old female who is a primary care patient of Haley Trevino, [place] and is actively engaged with the care management team. I reached out to Haley Trevino by phone today to assist with re-scheduling a follow up visit with the RN Case Manager  Follow up plan: Telephone appointment with care management team member scheduled for:05/13/2021  Noreene Larsson, [place], [place], [place] [address] Direct Dial: [phone] [email or web address]: [email or web address]

## 2021-04-29 ENCOUNTER — Ambulatory Visit (INDEPENDENT_AMBULATORY_CARE_PROVIDER_SITE_OTHER): Payer: Medicare Other

## 2021-04-29 VITALS — Ht 62.0 in | Wt 108.0 lb

## 2021-04-29 DIAGNOSIS — Z0000 Encounter for general adult medical examination without abnormal findings: Secondary | ICD-10-CM

## 2021-04-29 NOTE — Progress Notes (Signed)
Subjective:   Haley Trevino is a 80 y.o. female who presents for Medicare Annual (Subsequent) preventive examination.  Virtual Visit via Telephone Note  I connected with  Haley Trevino on 04/29/21 at  2:00 PM EDT by telephone and verified that I am speaking with the correct person using two identifiers.  Location: Patient: Home Provider: [facility] Persons participating in the virtual visit: patient/Nurse Health Advisor   I discussed the limitations, risks, security and privacy concerns of performing an evaluation and management service by telephone and the availability of in person appointments. The patient expressed understanding and agreed to proceed.  Interactive audio and video telecommunications were attempted between this nurse and patient, however failed, due to patient having technical difficulties OR patient did not have access to video capability.  We continued and completed visit with audio only.  Some vital signs may be absent or patient reported.   Haley Trevino Haley Scotlyn Mccranie, LPN   Review of Systems     Cardiac Risk Factors include: advanced age (>20men, >69 women);dyslipidemia;hypertension;sedentary lifestyle;Other (see comment), Risk factor comments: anemia     Objective:    Today's Vitals   04/29/21 1358  Weight: 108 lb (49 kg)  Height: 5\' 2"  (1.575 m)  PainSc: 5    Body mass index is 19.75 kg/m.  Advanced Directives 04/29/2021 04/17/2021 04/10/2021 04/03/2021 04/01/2021 02/04/2021 01/30/2021  Does Patient Have a Medical Advance Directive? No No No No No No No  Type of Advance Directive - - - - - - -  Does patient want to make changes to medical advance directive? - - - - - - -  Copy of Reading in Chart? - - - - - - -  Would patient like information on creating a medical advance directive? No - Patient declined No - Patient declined No - Patient declined No - Patient declined No - Patient declined No - Patient declined No - Patient declined     Current Medications (verified) Outpatient Encounter Medications as of 04/29/2021  Medication Sig   acetaminophen (TYLENOL) 500 MG tablet Take 1,000 mg by mouth every 6 (six) hours as needed for moderate pain or headache.   CALCIUM PO Take by mouth.   Cholecalciferol (VITAMIN D) 50 MCG (2000 UT) tablet Take 2,000 Units by mouth daily.   clonazePAM (KLONOPIN) 0.5 MG tablet Take 1 tablet (0.5 mg total) by mouth 2 (two) times daily as needed for anxiety.   ezetimibe (ZETIA) 10 MG tablet Take 1 tablet (10 mg total) by mouth daily.   FLUoxetine (PROZAC) 40 MG capsule Take 1 capsule (40 mg total) by mouth daily.   fluticasone (FLONASE) 50 MCG/ACT nasal spray PLACE 2 SPRAYS INTO BOTH NOSTRILS DAILY   NIFEdipine (PROCARDIA XL/NIFEDICAL XL) 60 MG 24 hr tablet Take 1 tablet (60 mg total) by mouth daily.   omeprazole (PRILOSEC) 20 MG capsule TAKE 1 TABLET DAILY   PROLIA 60 MG/ML SOSY injection Inject 60 mg into the skin every 6 (six) months.   RESTASIS 0.05 % ophthalmic emulsion Place 1 drop into both eyes daily.   sildenafil (REVATIO) 20 MG tablet Take 1 tablet (20 mg total) by mouth 2 (two) times daily.   SYNTHROID 75 MCG tablet TAKE (1) TABLET DAILY BE- FORE BREAKFAST.   No facility-administered encounter medications on file as of 04/29/2021.    Allergies (verified) Dilaudid [hydromorphone hcl], Dilaudid [hydromorphone], Acyclovir and related, Crestor [rosuvastatin calcium], Gabapentin, Aleve [naproxen sodium], and Lyrica [pregabalin]   History: Past Medical History:  Diagnosis Date   Cataract    Depression    Dyspnea    GERD (gastroesophageal reflux disease)    Hyperlipidemia    Hypertension    Hypothyroidism    IBS (irritable bowel syndrome)    Osteoarthritis    Raynaud disease    Scleroderma ([place])    Thyroid disease    hypothyroidism   Past Surgical History:  Procedure Laterality Date   BREAST ENHANCEMENT SURGERY  1975   BREAST IMPLANT REMOVAL Bilateral 04/23/2016    Procedure: REMOVALBILATERAL BREAST IMPLANTS;  Surgeon: Wallace Going, DO;  Location: [facility];  Service: Plastics;  Laterality: Bilateral;   CHOLECYSTECTOMY  1973   ESOPHAGOGASTRODUODENOSCOPY N/A 10/25/2020   Procedure: ESOPHAGOGASTRODUODENOSCOPY (EGD);  Surgeon: Clarene Essex, MD;  Location: [facility];  Service: Endoscopy;  Laterality: N/A;  enteroscopy   HAND SURGERY  08/2012; 11/2012   HOT HEMOSTASIS N/A 10/25/2020   Procedure: HOT HEMOSTASIS (ARGON PLASMA COAGULATION/BICAP);  Surgeon: Clarene Essex, MD;  Location: [facility];  Service: Endoscopy;  Laterality: N/A;   IR RADIOLOGIST EVAL & MGMT  07/27/2017   IR SACROPLASTY BILATERAL  07/31/2017   IR VERTEBROPLASTY CERV/THOR BX INC UNI/BIL INC/INJECT/IMAGING  03/13/2020   IR VERTEBROPLASTY EA ADDL (T&LS) BX INC UNI/BIL INC INJECT/IMAGING  03/14/2020   MELANOMA EXCISION     at 31 yrs of age   ORIF HIP FRACTURE Right 02/22/2014   Procedure: OPEN REDUCTION INTERNAL FIXATION RIGHT HIP;  Surgeon: Sanjuana Kava, MD;  Location: [facility];  Service: Orthopedics;  Laterality: Right;   TOTAL ABDOMINAL HYSTERECTOMY  1974   Family History  Problem Relation Age of Onset   Pancreatic cancer Father    Raynaud syndrome Father    Cancer Father        pancreatic   Rashes / Skin problems Daughter        possibly scleraderma   Hip fracture Mother    Mental illness Mother        attempted suicide at 66 yo   Social History   Socioeconomic History   Marital status: Married    Spouse name: Not on file   Number of children: 2   Years of education: Not on file   Highest education level: Some college, no degree  Occupational History   Occupation: retired Occupational psychologist at [employer]  Tobacco Use   Smoking status: Never   Smokeless tobacco: Never   Tobacco comments:    passive tobacco smoke exposure as child  Vaping Use   Vaping Use: Never used  Substance and Sexual Activity   Alcohol use: No   Drug use: No   Sexual activity: Not  Currently    Birth control/protection: Surgical  Other Topics Concern   Not on file  Social History Narrative   Regular exercise: walkingCaffeine use: 1 cup of coffee daily   Lives in split level home with her husband   Social Determinants of Health   Financial Resource Strain: Low Risk    Difficulty of Paying Living Expenses: Not hard at all  Food Insecurity: No Food Insecurity   Worried About Charity fundraiser in the Last Year: Never true   Arboriculturist in the Last Year: Never true  Transportation Needs: No Transportation Needs   Lack of Transportation (Medical): No   Lack of Transportation (Non-Medical): No  Physical Activity: Insufficiently Active   Days of Exercise per Week: 7 days   Minutes of Exercise per Session: 10 min  Stress: Stress Concern Present  Feeling of Stress : To some extent  Social Connections: Engineer, building services of Communication with Friends and Family: More than three times a week   Frequency of Social Gatherings with Friends and Family: Once a week   Attends Religious Services: 1 to 4 times per year   Active Member of [employer] or Organizations: Yes   Attends Archivist Meetings: 1 to 4 times per year   Marital Status: Married    Tobacco Counseling Counseling given: Not Answered Tobacco comments: passive tobacco smoke exposure as child   Clinical Intake:  Pre-visit preparation completed: Yes  Pain : 0-10 Pain Score: 5  Pain Type: Chronic pain Pain Location: Shoulder Pain Orientation: Left Pain Descriptors / Indicators: Aching, Sharp, Sore, Tender, Nagging Pain Onset: More than a month ago Pain Frequency: Intermittent     BMI - recorded: 19.75 Nutritional Status: BMI of 19-24  Normal Nutritional Risks: Unintentional weight loss Diabetes: No  How often do you need to have someone help you when you read instructions, pamphlets, or other written materials from your doctor or pharmacy?: 1 - Never  Diabetic?  no  Interpreter Needed?: No  Information entered by :: Estle Huguley, LPN   Activities of Daily Living In your present state of health, do you have any difficulty performing the following activities: 04/29/2021  Hearing? Y  Comment mild right ear  Vision? N  Difficulty concentrating or making decisions? N  Walking or climbing stairs? N  Dressing or bathing? N  Doing errands, shopping? N  Preparing Food and eating ? N  Using the Toilet? N  In the past six months, have you accidently leaked urine? N  Do you have problems with loss of bowel control? N  Managing your Medications? N  Managing your Finances? N  Housekeeping or managing your Housekeeping? N  Some recent data might be hidden    Patient Care Team: [name], FNP as PCP - General (Nurse Practitioner) Clarene Essex, MD as Consulting Physician (Gastroenterology) Philemon Kingdom, MD as Consulting Physician (Internal Medicine) Hennie Duos, MD as Consulting Physician (Rheumatology) Peggyann Juba, MD as Referring Physician (Orthopedic Surgery) Artis Delay, MD as Referring Physician (Internal Medicine) Justice Britain, MD as Consulting Physician (Orthopedic Surgery) Sanjuana Kava, MD as Consulting Physician (Orthopedic Surgery) Barrett, Felisa Bonier as Physician Assistant (Cardiology) Katha Cabal, LCSW as [place] (Licensed Clinical Social Worker) Ilean China, RN as Case Manager  Indicate any recent [address] you may have received from other than [facility] providers in the past year (date may be approximate).     Assessment:   This is a routine wellness examination for Haley Trevino.  Hearing/Vision screen Hearing Screening - Comments:: C/o mild hearing difficulties in right ear only Vision Screening - Comments:: Wears reading glasses prn - behind on annual eye exams with [facility]  Dietary issues and exercise activities discussed: Current Exercise  Habits: Home exercise routine, Type of exercise: walking, Time (Minutes): 15, Frequency (Times/Week): 7, Weekly Exercise (Minutes/Week): 105, Intensity: Mild, Exercise limited by: Other - see comments;orthopedic condition(s) (anemia)   Goals Addressed             This Visit's Progress    Exercise 3x per week (30 min per time)   On track    Walk daily as tolerated       Depression Screen [facility] 2/9 Scores 04/29/2021 04/08/2021 12/05/2020 12/05/2020 11/12/2020 09/14/2020 08/16/2020  PHQ - 2 Score 2 2 2 2 2  0  1  PHQ- 9 Score 6 8 5 5 4  - -  Exception Documentation - - - - - - -    Fall Risk Fall Risk  04/29/2021 02/13/2021 11/12/2020 09/14/2020 08/16/2020  Falls in the past year? 0 0 0 0 0  Number falls in past yr: 0 - - - 0  Injury with Fall? 0 - - - 0  Comment - - - - -  Risk Factor Category  - - - - -  Risk for fall due to : Orthopedic patient;Medication side effect - - - History of fall(s)  Follow up Falls prevention discussed - - - Falls evaluation completed  Comment - - - - -    FALL RISK PREVENTION PERTAINING TO THE HOME:  Any stairs in or around the home? Yes  If so, are there any without handrails? No  Home free of loose throw rugs in walkways, pet beds, electrical cords, etc? Yes  Adequate lighting in your home to reduce risk of falls? Yes   ASSISTIVE DEVICES UTILIZED TO PREVENT FALLS:  Life alert? No  Use of a cane, walker or w/c? No  Grab bars in the bathroom? Yes  Shower chair or bench in shower? Yes  Elevated toilet seat or a handicapped toilet? Yes   TIMED UP AND GO:  Was the test performed? No . Telephonic visit  Cognitive Function: Normal cognitive status assessed by direct observation by this Nurse Health Advisor. No abnormalities found.   MMSE - Mini Mental State Exam 04/20/2018 03/26/2017 12/08/2014  Orientation to time 5 5 5   Orientation to Place 5 5 5   Registration 3 3 3   Attention/ Calculation 5 5 5   Recall 1 0 3  Language- name 2 objects 2 2 2   Language-  repeat 1 1 1   Language- follow 3 step command 3 3 3   Language- read & follow direction 1 1 1   Write a sentence 1 1 1   Copy design 1 0 1  Total score 28 26 30      6CIT Screen 04/26/2020 04/26/2019  What Year? 0 points 0 points  What month? 0 points 0 points  What time? 0 points 0 points  Count back from 20 0 points 0 points  Months in reverse 0 points 0 points  Repeat phrase 0 points 0 points  Total Score 0 0    Immunizations Immunization History  Administered Date(s) Administered   Fluad Quad(high Dose 65+) 04/14/2019, 05/15/2020, 04/19/2021   Influenza, High Dose Seasonal PF 04/08/2018   Influenza,inj,Quad PF,6+ Mos 04/06/2013, 04/19/2014, 04/19/2015, 03/25/2016, 04/02/2017   Influenza-Unspecified 04/19/2014   Moderna Sars-Covid-2 Vaccination 08/18/2019, 09/16/2019, 05/23/2020   Pneumococcal Conjugate-13 06/02/2014   Pneumococcal Polysaccharide-23 06/25/2011, 04/06/2014   Tdap 07/07/2005, 04/17/2006, 03/26/2017   Zoster Recombinat (Shingrix) 04/20/2018, 06/23/2018   Zoster, Live 08/06/2009    TDAP status: Up to date  Flu Vaccine status: Up to date  Pneumococcal vaccine status: Up to date  Covid-19 vaccine status: Completed vaccines  Qualifies for Shingles Vaccine? Yes   Zostavax completed Yes   Shingrix Completed?: Yes  Screening Tests Health Maintenance  Topic Date Due   COVID-19 Vaccine (4 - Booster for Moderna series) 07/18/2020   COLONOSCOPY (Pts 45-49yrs Insurance coverage will need to be confirmed)  05/14/2021 (Originally 06/23/2018)   Hepatitis C Screening  02/13/2022 (Originally 07/22/1958)   DEXA SCAN  08/16/2022   TETANUS/TDAP  03/27/2027   Pneumonia Vaccine 82+ Years old  Completed   INFLUENZA VACCINE  Completed   Zoster Vaccines-  Shingrix  Completed   HPV VACCINES  Aged Out    Health Maintenance  Health Maintenance Due  Topic Date Due   COVID-19 Vaccine (4 - Booster for Moderna series) 07/18/2020    Colorectal cancer screening: Type of  screening: Colonoscopy. Completed 06/23/2013. Repeat every 5 years ? Maybe - scheduled to see Dr Watt Climes 05/03/2021  Mammogram status: Completed 04/18/2021. Repeat every year  Bone Density status: Completed 08/16/2020. Results reflect: Bone density results: OSTEOPOROSIS. Repeat every 2 years.  Lung Cancer Screening: (Low Dose CT Chest recommended if Age 31-80 years, 30 pack-year currently smoking OR have quit w/in 15years.) does not qualify  Additional Screening:  Hepatitis C Screening: does not qualify  Vision Screening: Recommended annual ophthalmology exams for early detection of glaucoma and other disorders of the eye. Is the patient up to date with their annual eye exam?  Yes  Who is the provider or what is the name of the office in which the patient attends annual eye exams? [facility] If pt is not established with a provider, would they like to be referred to a provider to establish care? No .   Dental Screening: Recommended annual dental exams for proper oral hygiene  Community Resource Referral / Chronic Care Management: CRR required this visit?  No   CCM required this visit?  No      Plan:     I have personally reviewed and noted the following in the patient's chart:   Medical and social history Use of alcohol, tobacco or illicit drugs  Current medications and supplements including opioid prescriptions.  Functional ability and status Nutritional status Physical activity Advanced directives List of other physicians Hospitalizations, surgeries, and ER visits in previous 12 months Vitals Screenings to include cognitive, depression, and falls Referrals and appointments  In addition, I have reviewed and discussed with patient certain preventive protocols, quality metrics, and best practice recommendations. A written personalized care plan for preventive services as well as general preventive health recommendations were provided to patient.     Sandrea Hammond, LPN   [id number]   Nurse Notes: None

## 2021-04-29 NOTE — Patient Instructions (Signed)
[name] , Thank you for taking time to come for your Medicare Wellness Visit. I appreciate your ongoing commitment to your health goals. Please review the following plan we discussed and let me know if I can assist you in the future.   Screening recommendations/referrals: Colonoscopy: Done 06/23/2013 - appointment with [place] 05/03/21 Mammogram: Done 04/18/2021 - Repeat annually Bone Density: Done 08/16/2020 - Repeat every 2 years Recommended yearly ophthalmology/optometry visit for glaucoma screening and checkup Recommended yearly dental visit for hygiene and checkup  Vaccinations: Influenza vaccine: Done 04/19/2021 -Repeat every 2 years Pneumococcal vaccine: Done 06/25/2011 & 06/02/2014 Tdap vaccine: Done 03/26/2017 - Repeat in 10 years Shingles vaccine: Done 04/20/2018 & 06/23/2018   Covid-19: Done 08/18/2019, 09/16/2019, & 05/23/2020  Advanced directives: Please bring a copy of your health care power of attorney and living will to the office to be added to your chart at your convenience.   Conditions/risks identified: Aim for 30 minutes of exercise or brisk walking each day, drink 6-8 glasses of water and eat lots of fruits and vegetables.   Next appointment: Follow up in one year for your annual wellness visit    Preventive Care 65 Years and Older, Female Preventive care refers to lifestyle choices and visits with your health care provider that can promote health and wellness. What does preventive care include? A yearly physical exam. This is also called an annual well check. Dental exams once or twice a year. Routine eye exams. Ask your health care provider how often you should have your eyes checked. Personal lifestyle choices, including: Daily care of your teeth and gums. Regular physical activity. Eating a healthy diet. Avoiding tobacco and drug use. Limiting alcohol use. Practicing safe sex. Taking low-dose aspirin every day. Taking vitamin and mineral supplements as  recommended by your health care provider. What happens during an annual well check? The services and screenings done by your health care provider during your annual well check will depend on your age, overall health, lifestyle risk factors, and family history of disease. Counseling  Your health care provider may ask you questions about your: Alcohol use. Tobacco use. Drug use. Emotional well-being. Home and relationship well-being. Sexual activity. Eating habits. History of falls. Memory and ability to understand (cognition). Work and work Statistician. Reproductive health. Screening  You may have the following tests or measurements: Height, weight, and BMI. Blood pressure. Lipid and cholesterol levels. These may be checked every 5 years, or more frequently if you are over 47 years old. Skin check. Lung cancer screening. You may have this screening every year starting at age 65 if you have a 30-pack-year history of smoking and currently smoke or have quit within the past 15 years. Fecal occult blood test (FOBT) of the stool. You may have this test every year starting at age 68. Flexible sigmoidoscopy or colonoscopy. You may have a sigmoidoscopy every 5 years or a colonoscopy every 10 years starting at age 56. Hepatitis C blood test. Hepatitis B blood test. Sexually transmitted disease (STD) testing. Diabetes screening. This is done by checking your blood sugar (glucose) after you have not eaten for a while (fasting). You may have this done every 1-3 years. Bone density scan. This is done to screen for osteoporosis. You may have this done starting at age 62. Mammogram. This may be done every 1-2 years. Talk to your health care provider about how often you should have regular mammograms. Talk with your health care provider about your test results, treatment options, and  if necessary, the need for more tests. Vaccines  Your health care provider may recommend certain vaccines, such  as: Influenza vaccine. This is recommended every year. Tetanus, diphtheria, and acellular pertussis (Tdap, Td) vaccine. You may need a Td booster every 10 years. Zoster vaccine. You may need this after age 8. Pneumococcal 13-valent conjugate (PCV13) vaccine. One dose is recommended after age 25. Pneumococcal polysaccharide (PPSV23) vaccine. One dose is recommended after age 4. Talk to your health care provider about which screenings and vaccines you need and how often you need them. This information is not intended to replace advice given to you by your health care provider. Make sure you discuss any questions you have with your health care provider. Document Released: 07/20/2015 Document Revised: 03/12/2016 Document Reviewed: 04/24/2015 Elsevier Interactive Patient Education  2017 [employer] Prevention in the Home Falls can cause injuries. They can happen to people of all ages. There are many things you can do to make your home safe and to help prevent falls. What can I do on the outside of my home? Regularly fix the edges of walkways and driveways and fix any cracks. Remove anything that might make you trip as you walk through a door, such as a raised step or threshold. Trim any bushes or trees on the path to your home. Use bright outdoor lighting. Clear any walking paths of anything that might make someone trip, such as rocks or tools. Regularly check to see if handrails are loose or broken. Make sure that both sides of any steps have handrails. Any raised decks and porches should have guardrails on the edges. Have any leaves, snow, or ice cleared regularly. Use sand or salt on walking paths during winter. Clean up any spills in your garage right away. This includes oil or grease spills. What can I do in the bathroom? Use night lights. Install grab bars by the toilet and in the tub and shower. Do not use towel bars as grab bars. Use non-skid mats or decals in the tub or  shower. If you need to sit down in the shower, use a plastic, non-slip stool. Keep the floor dry. Clean up any water that spills on the floor as soon as it happens. Remove soap buildup in the tub or shower regularly. Attach bath mats securely with double-sided non-slip rug tape. Do not have throw rugs and other things on the floor that can make you trip. What can I do in the bedroom? Use night lights. Make sure that you have a light by your bed that is easy to reach. Do not use any sheets or blankets that are too big for your bed. They should not hang down onto the floor. Have a firm chair that has side arms. You can use this for support while you get dressed. Do not have throw rugs and other things on the floor that can make you trip. What can I do in the kitchen? Clean up any spills right away. Avoid walking on wet floors. Keep items that you use a lot in easy-to-reach places. If you need to reach something above you, use a strong step stool that has a grab bar. Keep electrical cords out of the way. Do not use floor polish or wax that makes floors slippery. If you must use wax, use non-skid floor wax. Do not have throw rugs and other things on the floor that can make you trip. What can I do with my stairs? Do not leave any  items on the stairs. Make sure that there are handrails on both sides of the stairs and use them. Fix handrails that are broken or loose. Make sure that handrails are as long as the stairways. Check any carpeting to make sure that it is firmly attached to the stairs. Fix any carpet that is loose or worn. Avoid having throw rugs at the top or bottom of the stairs. If you do have throw rugs, attach them to the floor with carpet tape. Make sure that you have a light switch at the top of the stairs and the bottom of the stairs. If you do not have them, ask someone to add them for you. What else can I do to help prevent falls? Wear shoes that: Do not have high heels. Have  rubber bottoms. Are comfortable and fit you well. Are closed at the toe. Do not wear sandals. If you use a stepladder: Make sure that it is fully opened. Do not climb a closed stepladder. Make sure that both sides of the stepladder are locked into place. Ask someone to hold it for you, if possible. Clearly mark and make sure that you can see: Any grab bars or handrails. First and last steps. Where the edge of each step is. Use tools that help you move around (mobility aids) if they are needed. These include: Canes. Walkers. Scooters. Crutches. Turn on the lights when you go into a dark area. Replace any light bulbs as soon as they burn out. Set up your furniture so you have a clear path. Avoid moving your furniture around. If any of your floors are uneven, fix them. If there are any pets around you, be aware of where they are. Review your medicines with your doctor. Some medicines can make you feel dizzy. This can increase your chance of falling. Ask your doctor what other things that you can do to help prevent falls. This information is not intended to replace advice given to you by your health care provider. Make sure you discuss any questions you have with your health care provider. Document Released: 04/19/2009 Document Revised: 11/29/2015 Document Reviewed: 07/28/2014 Elsevier Interactive Patient Education  2017 [employer].

## 2021-05-03 ENCOUNTER — Other Ambulatory Visit: Payer: Self-pay | Admitting: Gastroenterology

## 2021-05-03 ENCOUNTER — Ambulatory Visit
Admission: RE | Admit: 2021-05-03 | Discharge: 2021-05-03 | Disposition: A | Discharge status: Home / Self Care | Payer: Medicare Other | Source: Ambulatory Visit | Attending: Gastroenterology | Admitting: Gastroenterology

## 2021-05-03 DIAGNOSIS — Q2733 Arteriovenous malformation of digestive system vessel: Secondary | ICD-10-CM | POA: Diagnosis not present

## 2021-05-03 DIAGNOSIS — T182XXA Foreign body in stomach, initial encounter: Secondary | ICD-10-CM | POA: Diagnosis not present

## 2021-05-03 DIAGNOSIS — D509 Iron deficiency anemia, unspecified: Secondary | ICD-10-CM | POA: Diagnosis not present

## 2021-05-06 DIAGNOSIS — M159 Polyosteoarthritis, unspecified: Secondary | ICD-10-CM | POA: Diagnosis not present

## 2021-05-06 DIAGNOSIS — M810 Age-related osteoporosis without current pathological fracture: Secondary | ICD-10-CM

## 2021-05-06 DIAGNOSIS — I10 Essential (primary) hypertension: Secondary | ICD-10-CM

## 2021-05-06 DIAGNOSIS — E785 Hyperlipidemia, unspecified: Secondary | ICD-10-CM

## 2021-05-06 DIAGNOSIS — E039 Hypothyroidism, unspecified: Secondary | ICD-10-CM | POA: Diagnosis not present

## 2021-05-06 DIAGNOSIS — F3342 Major depressive disorder, recurrent, in full remission: Secondary | ICD-10-CM

## 2021-05-13 ENCOUNTER — Telehealth: Payer: Self-pay | Admitting: *Deleted

## 2021-05-13 ENCOUNTER — Telehealth: Payer: Medicare Other | Admitting: *Deleted

## 2021-05-14 ENCOUNTER — Other Ambulatory Visit: Payer: Self-pay

## 2021-05-14 ENCOUNTER — Ambulatory Visit (INDEPENDENT_AMBULATORY_CARE_PROVIDER_SITE_OTHER): Payer: Medicare Other

## 2021-05-14 ENCOUNTER — Ambulatory Visit
Admission: EM | Admit: 2021-05-14 | Discharge: 2021-05-14 | Disposition: A | Discharge status: Home / Self Care | Payer: Medicare Other | Attending: Student | Admitting: Student

## 2021-05-14 ENCOUNTER — Encounter: Payer: Self-pay | Admitting: Emergency Medicine

## 2021-05-14 DIAGNOSIS — S9032XA Contusion of left foot, initial encounter: Secondary | ICD-10-CM | POA: Diagnosis not present

## 2021-05-14 DIAGNOSIS — M79672 Pain in left foot: Secondary | ICD-10-CM | POA: Diagnosis not present

## 2021-05-14 DIAGNOSIS — S99922A Unspecified injury of left foot, initial encounter: Secondary | ICD-10-CM | POA: Diagnosis not present

## 2021-05-14 NOTE — ED Notes (Signed)
Correction to triage note.  It is the left foot that the trivet was dropped on.

## 2021-05-14 NOTE — ED Triage Notes (Signed)
Dropped a cast iron trivet on right foot yesterday.

## 2021-05-14 NOTE — ED Provider Notes (Signed)
[facility]    CSN: [id number] Arrival date & time: 05/14/21  1136      History   Chief Complaint No chief complaint on file.   HPI Haley Trevino is a 80 y.o. female presenting with L foot pain following dropping a cast iron trivet on the foot 1 day ago. Medical history noncontributory as below. Describes diffuse pain over the plantar and lateral aspects of the foot. Denies sensation changes. Unable to ambulate on the foot due to the pain. Denies pain or injury elsewhere.   HPI  Past Medical History:  Diagnosis Date   Cataract    Depression    Dyspnea    GERD (gastroesophageal reflux disease)    Hyperlipidemia    Hypertension    Hypothyroidism    IBS (irritable bowel syndrome)    Osteoarthritis    Raynaud disease    Scleroderma ([place])    Thyroid disease    hypothyroidism    Patient Active Problem List   Diagnosis Date Noted   History of adenomatous polyp of colon 09/21/2020   Weight loss 09/21/2020   Irritable bowel syndrome 09/21/2020   Breast implant rupture 09/04/2020   Iron deficiency anemia due to chronic blood loss 08/28/2020   Pain in right shoulder 04/11/2020   Murmur 11/18/2017   Impingement syndrome of left shoulder region 12/08/2014   Osteoporosis 09/20/2014   Hip fracture requiring operative repair ([place]) 02/22/2014   Hypothyroidism 11/29/2013   Depression 11/29/2013   GERD (gastroesophageal reflux disease) 11/29/2013   Hypertension 11/29/2013   Hyperlipidemia with target LDL less than 100 11/29/2013   Thyroid cyst 04/26/2013   MALIGNANT MELANOMA, SKIN 08/07/2010   [name] 08/06/2010   SCLERODERMA 08/06/2010   Osteoarthritis 08/06/2010    Past Surgical History:  Procedure Laterality Date   BREAST ENHANCEMENT SURGERY  1975   BREAST IMPLANT REMOVAL Bilateral 04/23/2016   Procedure: REMOVALBILATERAL BREAST IMPLANTS;  Surgeon: Wallace Going, DO;  Location: [facility];  Service: Plastics;  Laterality:  Bilateral;   CHOLECYSTECTOMY  1973   ESOPHAGOGASTRODUODENOSCOPY N/A 10/25/2020   Procedure: ESOPHAGOGASTRODUODENOSCOPY (EGD);  Surgeon: Clarene Essex, MD;  Location: [facility];  Service: Endoscopy;  Laterality: N/A;  enteroscopy   HAND SURGERY  08/2012; 11/2012   HOT HEMOSTASIS N/A 10/25/2020   Procedure: HOT HEMOSTASIS (ARGON PLASMA COAGULATION/BICAP);  Surgeon: Clarene Essex, MD;  Location: [facility];  Service: Endoscopy;  Laterality: N/A;   IR RADIOLOGIST EVAL & MGMT  07/27/2017   IR SACROPLASTY BILATERAL  07/31/2017   IR VERTEBROPLASTY CERV/THOR BX INC UNI/BIL INC/INJECT/IMAGING  03/13/2020   IR VERTEBROPLASTY EA ADDL (T&LS) BX INC UNI/BIL INC INJECT/IMAGING  03/14/2020   MELANOMA EXCISION     at 31 yrs of age   ORIF HIP FRACTURE Right 02/22/2014   Procedure: OPEN REDUCTION INTERNAL FIXATION RIGHT HIP;  Surgeon: Sanjuana Kava, MD;  Location: [facility];  Service: Orthopedics;  Laterality: Right;   TOTAL ABDOMINAL HYSTERECTOMY  1974    OB History     Gravida  3   Para  2   Term      Preterm      AB  1   Living  1      SAB  1   IAB      Ectopic      Multiple      Live Births               Home Medications    Prior to Admission medications  Medication Sig Start Date End Date Taking? Authorizing Provider  acetaminophen (TYLENOL) 500 MG tablet Take 1,000 mg by mouth every 6 (six) hours as needed for moderate pain or headache.    [provider]  CALCIUM PO Take by mouth.    [provider]  Cholecalciferol (VITAMIN D) 50 MCG (2000 UT) tablet Take 2,000 Units by mouth daily.    [provider]  clonazePAM (KLONOPIN) 0.5 MG tablet Take 1 tablet (0.5 mg total) by mouth 2 (two) times daily as needed for anxiety. 02/13/21   Hassell Done, Mary-Margaret, FNP  ezetimibe (ZETIA) 10 MG tablet Take 1 tablet (10 mg total) by mouth daily. 02/13/21   Hassell Done, Mary-Margaret, FNP  FLUoxetine (PROZAC) 40 MG capsule Take 1 capsule (40 mg total) by mouth daily. 02/13/21    Hassell Done Mary-Margaret, FNP  fluticasone (FLONASE) 50 MCG/ACT nasal spray PLACE 2 SPRAYS INTO BOTH NOSTRILS DAILY 12/25/20   Hassell Done, Mary-Margaret, FNP  NIFEdipine (PROCARDIA XL/NIFEDICAL XL) 60 MG 24 hr tablet Take 1 tablet (60 mg total) by mouth daily. 02/13/21   Hassell Done Mary-Margaret, FNP  omeprazole (PRILOSEC) 20 MG capsule TAKE 1 TABLET DAILY 02/13/21   Hassell Done, Mary-Margaret, FNP  PROLIA 60 MG/ML SOSY injection Inject 60 mg into the skin every 6 (six) months. 09/07/20   Hassell Done, Mary-Margaret, FNP  RESTASIS 0.05 % ophthalmic emulsion Place 1 drop into both eyes daily. 11/27/20   [provider]  sildenafil (REVATIO) 20 MG tablet Take 1 tablet (20 mg total) by mouth 2 (two) times daily. 02/13/21   [name], FNP  SYNTHROID 75 MCG tablet TAKE (1) TABLET DAILY BE- FORE BREAKFAST. 12/25/20   [name], FNP    Family History Family History  Problem Relation Age of Onset   Pancreatic cancer Father    Raynaud syndrome Father    Cancer Father        pancreatic   Rashes / Skin problems Daughter        possibly scleraderma   Hip fracture Mother    Mental illness Mother        attempted suicide at 83 yo    Social History Social History   Tobacco Use   Smoking status: Never   Smokeless tobacco: Never   Tobacco comments:    passive tobacco smoke exposure as child  Vaping Use   Vaping Use: Never used  Substance Use Topics   Alcohol use: No   Drug use: No     Allergies   Dilaudid [hydromorphone hcl], Dilaudid [hydromorphone], Acyclovir and related, Crestor [rosuvastatin calcium], Gabapentin, Aleve [naproxen sodium], and Lyrica [pregabalin]   Review of Systems Review of Systems  Musculoskeletal:        L foot pain    Physical Exam Triage Vital Signs ED Triage Vitals  Enc Vitals Group     BP 05/14/21 1630 (!) 145/66     Pulse Rate 05/14/21 1630 73     Resp 05/14/21 1630 18     Temp 05/14/21 1630 98.1 F (36.7 C)     Temp Source 05/14/21 1630 Oral      SpO2 05/14/21 1630 92 %     Weight --      Height --      Head Circumference --      Peak Flow --      Pain Score 05/14/21 1631 10     Pain Loc --      Pain Edu? --      Excl. in [place]? --  No data found.  Updated Vital Signs BP (!) 145/66 (BP Location: Right Arm)   Pulse 73   Temp 98.1 F (36.7 C) (Oral)   Resp 18   SpO2 92%   Visual Acuity Right Eye Distance:   Left Eye Distance:   Bilateral Distance:    Right Eye Near:   Left Eye Near:    Bilateral Near:     Physical Exam Vitals reviewed.  Constitutional:      General: She is not in acute distress.    Appearance: Normal appearance. She is not ill-appearing.  HENT:     Head: Normocephalic and atraumatic.  Pulmonary:     Effort: Pulmonary effort is normal.  Musculoskeletal:     Comments: L foot: mild effusion to midfoot without other skin changes. TTP lateral aspect and plantar aspect without point changes. Plantar and dorsiflexion intact but with pain. No malleolar tenderness. DP 2+, cap refill <2 seconds. In wheelchair due to pain.   Neurological:     General: No focal deficit present.     Mental Status: She is alert and oriented to person, place, and time.  Psychiatric:        Mood and Affect: Mood normal.        Behavior: Behavior normal.        Thought Content: Thought content normal.        Judgment: Judgment normal.     UC Treatments / Results  Labs (all labs ordered are listed, but only abnormal results are displayed) Labs Reviewed - No data to display  EKG   Radiology DG Foot Complete Left  Result Date: 05/14/2021 CLINICAL DATA:  Blunt trauma EXAM: LEFT FOOT - COMPLETE 3+ VIEW COMPARISON:  None. FINDINGS: No fracture or dislocation of mid foot or forefoot. The phalanges are normal. The calcaneus is normal. No soft tissue abnormality. IMPRESSION: No fracture or dislocation. Electronically Signed   By: Suzy Bouchard M.D.   On: 05/14/2021 16:34    Procedures Procedures (including critical  care time)  Medications Ordered in UC Medications - No data to display  Initial Impression / Assessment and Plan / UC Course  I have reviewed the triage vital signs and the nursing notes.  Pertinent labs & imaging results that were available during my care of the patient were reviewed by me and considered in my medical decision making (see chart for details).     This patient is a very pleasant 80 y.o. year old female presenting with L foot contusion following dropping heavy object on it. Neurovascularly intact.  Xray L foot- no fracture or dislocation .  Postop shoe, f/u with PCP for recheck in 5 days.  ED return precautions discussed. Patient verbalizes understanding and agreement.    Final Clinical Impressions(s) / UC Diagnoses   Final diagnoses:  Contusion of left foot, initial encounter     Discharge Instructions      -Boot while pain persists -Follow-up with PCP for recheck in 5 days, or sooner if symptoms worsen -You can take Tylenol up to 1000 mg 3 times daily, and also try ice, rest, elevation      ED Prescriptions   None    PDMP not reviewed this encounter.   Hazel Sams, PA-C 05/14/21 1731

## 2021-05-14 NOTE — Discharge Instructions (Addendum)
-  Boot while pain persists -Follow-up with PCP for recheck in 5 days, or sooner if symptoms worsen -You can take Tylenol up to 1000 mg 3 times daily, and also try ice, rest, elevation

## 2021-05-17 NOTE — Telephone Encounter (Signed)
Noted - will reach back out soon

## 2021-05-28 ENCOUNTER — Telehealth: Payer: Medicare Other

## 2021-06-03 ENCOUNTER — Other Ambulatory Visit (HOSPITAL_COMMUNITY): Payer: Self-pay | Admitting: *Deleted

## 2021-06-03 ENCOUNTER — Telehealth (HOSPITAL_COMMUNITY): Payer: Self-pay | Admitting: *Deleted

## 2021-06-03 DIAGNOSIS — D500 Iron deficiency anemia secondary to blood loss (chronic): Secondary | ICD-10-CM

## 2021-06-03 DIAGNOSIS — D649 Anemia, unspecified: Secondary | ICD-10-CM

## 2021-06-03 NOTE — Telephone Encounter (Signed)
Patient called stating that she is feeling weak and dizzy, similar to when her hgb and iron are low.  Appt made for tomorrow to assess labs.  Encouraged her to make an appointment with PCP in addition to lab testing.  Verbalized understanding.

## 2021-06-04 ENCOUNTER — Inpatient Hospital Stay (HOSPITAL_COMMUNITY): Payer: Medicare Other | Attending: Hematology

## 2021-06-04 DIAGNOSIS — D649 Anemia, unspecified: Secondary | ICD-10-CM

## 2021-06-04 DIAGNOSIS — K922 Gastrointestinal hemorrhage, unspecified: Secondary | ICD-10-CM | POA: Insufficient documentation

## 2021-06-04 DIAGNOSIS — D500 Iron deficiency anemia secondary to blood loss (chronic): Secondary | ICD-10-CM | POA: Insufficient documentation

## 2021-06-04 LAB — CBC WITH DIFFERENTIAL/PLATELET
Abs Immature Granulocytes: 0.01 10*3/uL (ref 0.00–0.07)
Basophils Absolute: 0 10*3/uL (ref 0.0–0.1)
Basophils Relative: 1 %
Eosinophils Absolute: 0.2 10*3/uL (ref 0.0–0.5)
Eosinophils Relative: 2 %
HCT: 36.9 % (ref 36.0–46.0)
Hemoglobin: 12.2 g/dL (ref 12.0–15.0)
Immature Granulocytes: 0 %
Lymphocytes Relative: 20 %
Lymphs Abs: 1.3 10*3/uL (ref 0.7–4.0)
MCH: 30.7 pg (ref 26.0–34.0)
MCHC: 33.1 g/dL (ref 30.0–36.0)
MCV: 92.7 fL (ref 80.0–100.0)
Monocytes Absolute: 0.4 10*3/uL (ref 0.1–1.0)
Monocytes Relative: 6 %
Neutro Abs: 4.6 10*3/uL (ref 1.7–7.7)
Neutrophils Relative %: 71 %
Platelets: 269 10*3/uL (ref 150–400)
RBC: 3.98 MIL/uL (ref 3.87–5.11)
RDW: 14.9 % (ref 11.5–15.5)
WBC: 6.5 10*3/uL (ref 4.0–10.5)
nRBC: 0 % (ref 0.0–0.2)

## 2021-06-04 LAB — IRON AND TIBC
Iron: 84 ug/dL (ref 28–170)
Saturation Ratios: 26 % (ref 10.4–31.8)
TIBC: 323 ug/dL (ref 250–450)
UIBC: 239 ug/dL

## 2021-06-04 LAB — FERRITIN: Ferritin: 89 ng/mL (ref 11–307)

## 2021-06-04 NOTE — Progress Notes (Signed)
No need for IV iron at this time.  Labs look pretty good. If she is still feeling poorly, please have her PCP to see if there is something else causing her symptoms.  Keep her next follow-up appointment with me as scheduled on 07/11/2021, with labs the week before.  Thanks!

## 2021-06-05 ENCOUNTER — Encounter (HOSPITAL_COMMUNITY): Payer: Self-pay | Admitting: Hematology

## 2021-06-05 NOTE — Progress Notes (Signed)
Patient called.  Patient aware.  Verbalized understanding and will follow up with PCP.

## 2021-06-13 ENCOUNTER — Telehealth: Payer: Medicare Other

## 2021-06-14 ENCOUNTER — Ambulatory Visit: Payer: Medicare Other | Admitting: Licensed Clinical Social Worker

## 2021-06-14 DIAGNOSIS — M349 Systemic sclerosis, unspecified: Secondary | ICD-10-CM

## 2021-06-14 DIAGNOSIS — F3342 Major depressive disorder, recurrent, in full remission: Secondary | ICD-10-CM

## 2021-06-14 DIAGNOSIS — E785 Hyperlipidemia, unspecified: Secondary | ICD-10-CM

## 2021-06-14 DIAGNOSIS — E039 Hypothyroidism, unspecified: Secondary | ICD-10-CM

## 2021-06-14 DIAGNOSIS — M159 Polyosteoarthritis, unspecified: Secondary | ICD-10-CM

## 2021-06-14 DIAGNOSIS — I7300 Raynaud's syndrome without gangrene: Secondary | ICD-10-CM

## 2021-06-14 DIAGNOSIS — K219 Gastro-esophageal reflux disease without esophagitis: Secondary | ICD-10-CM

## 2021-06-14 DIAGNOSIS — I10 Essential (primary) hypertension: Secondary | ICD-10-CM

## 2021-06-14 DIAGNOSIS — M810 Age-related osteoporosis without current pathological fracture: Secondary | ICD-10-CM

## 2021-06-14 NOTE — Patient Instructions (Addendum)
Visit Information  Patient Goals:  Manage Anxiety and Stress issues faced. Manage Depression issues faced  Timeframe: Short-Term Goal  This Visit's Progress: On Track  Priority : Medium    Start Date:   06/14/21  Expected End Date   09/06/21  Completed Date  Follow Up Date:  08/09/21 at 11:15 AM  Manage Anxiety and Stress issues faced. Manage depression issues faced  Patient Self Care Activities:  Completes ADLs daily Takes Medications as prescribed  Patient Coping Strengths:  Family support Communicates needs to spouse or family members Self Advocates as needed  Patient Self Care Deficits:  Anxiety or Stress Challenges  Patient Goals:  - spend time or talk with others every day - practice relaxation or meditation daily - keep a calendar with appointment dates  Follow Up Plan: LCSW to call client or Arbie Cookey, daughter of client on 08/09/21 at 11:15 AM to assess client needs    Norva Riffle.Darcee Dekker MSW, LCSW Licensed Clinical Social Worker [facility] Care Management [phone]

## 2021-06-14 NOTE — Telephone Encounter (Signed)
  Care Management   Follow Up Note  05/13/2021 Name: MARSHA GUNDLACH MRN: [id number] DOB: [birth year]   Referred by: [name], FNP Reason for referral : Chronic Care Management (Unsuccessful outreach)   A second unsuccessful telephone outreach was attempted today. The patient was referred to the case management team for assistance with care management and care coordination.   Follow Up Plan: Forwarding to [place] for outreach and rescheduling.   Chong Sicilian, BSN, RN-BC Embedded Chronic Care Manager [facility] [place] [facility] Direct Dial: [phone]

## 2021-06-14 NOTE — Chronic Care Management (AMB) (Signed)
Chronic Care Management    Clinical Social Work Note  06/14/2021 Name: Haley Trevino MRN: [id number] DOB: 01-Aug-1940  Haley Trevino is a 80 y.o. year old female who is a primary care patient of [name], [place]. The CCM team was consulted to assist the patient with chronic disease management and/or care coordination needs related to: [name] .   Engaged with patient by telephone for follow up visit in response to provider referral for social work chronic care management and care coordination services.   Consent to Services:  The patient was given information about Chronic Care Management services, agreed to services, and gave verbal consent prior to initiation of services.  Please see initial visit note for detailed documentation.   Patient agreed to services and consent obtained.   Assessment: Review of patient past medical history, allergies, medications, and health status, including review of relevant consultants reports was performed today as part of a comprehensive evaluation and provision of chronic care management and care coordination services.     SDOH (Social Determinants of Health) assessments and interventions performed:  SDOH Interventions    Flowsheet Row Most Recent Value  SDOH Interventions   Physical Activity Interventions Other (Comments)  [client has some walking challenges. She said that sometimes when she walks she has trembling or shakiness.]  Stress Interventions Provide Counseling  [client has stress related to managing her medical needs. client has stress related to managing the health needs of her spouse]  Depression Interventions/Treatment  Currently on Treatment        Advanced Directives Status: See Vynca application for related entries.  CCM Care Plan  Allergies  Allergen Reactions   Dilaudid [Hydromorphone Hcl] Anaphylaxis   Dilaudid [Hydromorphone] Anaphylaxis   Acyclovir And Related     Unknown reaction   Crestor  [Rosuvastatin Calcium] Other (See Comments)    Muscle pain   Gabapentin Swelling    Swelling in feet, and in legs   Aleve [Naproxen Sodium] Rash   Lyrica [Pregabalin] Other (See Comments)    Swelling in feet, and in legs    Outpatient Encounter Medications as of 06/14/2021  Medication Sig   acetaminophen (TYLENOL) 500 MG tablet Take 1,000 mg by mouth every 6 (six) hours as needed for moderate pain or headache.   CALCIUM PO Take by mouth.   Cholecalciferol (VITAMIN D) 50 MCG (2000 UT) tablet Take 2,000 Units by mouth daily.   clonazePAM (KLONOPIN) 0.5 MG tablet Take 1 tablet (0.5 mg total) by mouth 2 (two) times daily as needed for anxiety.   ezetimibe (ZETIA) 10 MG tablet Take 1 tablet (10 mg total) by mouth daily.   FLUoxetine (PROZAC) 40 MG capsule Take 1 capsule (40 mg total) by mouth daily.   fluticasone (FLONASE) 50 MCG/ACT nasal spray PLACE 2 SPRAYS INTO BOTH NOSTRILS DAILY   NIFEdipine (PROCARDIA XL/NIFEDICAL XL) 60 MG 24 hr tablet Take 1 tablet (60 mg total) by mouth daily.   omeprazole (PRILOSEC) 20 MG capsule TAKE 1 TABLET DAILY   PROLIA 60 MG/ML SOSY injection Inject 60 mg into the skin every 6 (six) months.   RESTASIS 0.05 % ophthalmic emulsion Place 1 drop into both eyes daily.   sildenafil (REVATIO) 20 MG tablet Take 1 tablet (20 mg total) by mouth 2 (two) times daily.   SYNTHROID 75 MCG tablet TAKE (1) TABLET DAILY BE- FORE BREAKFAST.   No facility-administered encounter medications on file as of 06/14/2021.    Patient Active Problem List   Diagnosis Date  Noted   History of adenomatous polyp of colon 09/21/2020   Weight loss 09/21/2020   Irritable bowel syndrome 09/21/2020   Breast implant rupture 09/04/2020   Iron deficiency anemia due to chronic blood loss 08/28/2020   Pain in right shoulder 04/11/2020   Murmur 11/18/2017   Impingement syndrome of left shoulder region 12/08/2014   Osteoporosis 09/20/2014   Hip fracture requiring operative repair ([place]) 02/22/2014    Hypothyroidism 11/29/2013   Depression 11/29/2013   GERD (gastroesophageal reflux disease) 11/29/2013   Hypertension 11/29/2013   Hyperlipidemia with target LDL less than 100 11/29/2013   Thyroid cyst 04/26/2013   MALIGNANT MELANOMA, SKIN 08/07/2010   [name] 08/06/2010   SCLERODERMA 08/06/2010   Osteoarthritis 08/06/2010    Conditions to be addressed/monitored: monitor client management of anxiety issues and of depression issues  Care Plan : LCSW Care Plan  Updates made by Katha Cabal, LCSW since 06/14/2021 12:00 AM     Problem: Anxiety Identification (Anxiety)      Goal: Anxiety Symptoms Identified;Manage anxiety symptoms. Manage Depression symptoms   Start Date: 06/14/2021  Expected End Date: 09/06/2021  This Visit's Progress: On track  Recent Progress: On track  Priority: Medium  Note:   Current Barriers:  Anxiety issues Decreased energy Stressors related to meeting care needs of her spouse Suicidal Ideation/Homicidal Ideation: No  Clinical Social Work Goal(s):  patient will work with SW monthly by telephone or in person to reduce or manage symptoms related to anxiety or stress issues faced Attend scheduled medical appointments in next 30 days Client or daughter of client will call RNCM as needed in next 30 days for CCM nursing support Client will communicate with SW in next 30 days to discuss ambulation of client, trembling of client , and iron infusion needs of client  Interventions: 1:1 collaboration with [name], FNP regarding development and update of comprehensive plan of care as evidenced by provider attestation and co-signature Discussed with Violetta her iron infusion status. She said she had her last iron infusion on April 17, 2021.  She said she is now due to have lab work to determine her iron levels and to see if she needs another  iron infusion treatment or not.  Discussed energy level of client. Client has reduced  energy. Reviewed with client her current meal needs and appetite.  Client said she has reduced appetite.  She said she drinks 1 Ensure per day . She eats one yogurt per day. Reviewed client support from Gastroenterologist Provided counseling support for client Reviewed mood of client. Client said she is sad occasionally  She is concerned over status of her right shoulder and concerned regarding right shoulder pain.  She is concerned about health needs of her spouse.   Encouraged client to call RNCM as needed for CCM nursing support Discussed client shakiness or trembling. She said when she picks up a glass or drink container it has to have a handle on it for her to pick it up. She has trembling in her hands occasionally.  She said it is harder now for her to make a fist with her hands.  She said she plans to talk with [name], FNP about these symptoms on her next visit with [name] FNP Discussed relaxation techniques.  Client said she reads to help her relax  Patient Self Care Activities:  Completes ADLs daily Takes Medications as prescribed  Patient Coping Strengths:  Family support Communicates needs to spouse or family members  Self Advocates as needed  Patient Self Care Deficits:  Anxiety or Stress Challenges  Patient Goals:  - spend time or talk with others every day - practice relaxation or meditation daily - keep a calendar with appointment dates  Follow Up Plan: LCSW to call client or Gardiner Fanti, daughter of client on 08/09/21 at 11:15 AM to assess needs of client      Norva Riffle.Lachelle Rissler MSW, LCSW Licensed Clinical Social Worker [facility] Care Management [phone]

## 2021-06-19 ENCOUNTER — Telehealth: Payer: Medicare Other

## 2021-07-02 ENCOUNTER — Inpatient Hospital Stay (HOSPITAL_COMMUNITY): Payer: Medicare Other | Attending: Hematology

## 2021-07-02 ENCOUNTER — Other Ambulatory Visit: Payer: Self-pay

## 2021-07-02 DIAGNOSIS — K922 Gastrointestinal hemorrhage, unspecified: Secondary | ICD-10-CM | POA: Diagnosis not present

## 2021-07-02 DIAGNOSIS — D500 Iron deficiency anemia secondary to blood loss (chronic): Secondary | ICD-10-CM

## 2021-07-02 LAB — CBC WITH DIFFERENTIAL/PLATELET
Abs Immature Granulocytes: 0.02 10*3/uL (ref 0.00–0.07)
Basophils Absolute: 0 10*3/uL (ref 0.0–0.1)
Basophils Relative: 1 %
Eosinophils Absolute: 0.2 10*3/uL (ref 0.0–0.5)
Eosinophils Relative: 3 %
HCT: 34.9 % — ABNORMAL LOW (ref 36.0–46.0)
Hemoglobin: 11.3 g/dL — ABNORMAL LOW (ref 12.0–15.0)
Immature Granulocytes: 0 %
Lymphocytes Relative: 22 %
Lymphs Abs: 1.2 10*3/uL (ref 0.7–4.0)
MCH: 30.1 pg (ref 26.0–34.0)
MCHC: 32.4 g/dL (ref 30.0–36.0)
MCV: 93.1 fL (ref 80.0–100.0)
Monocytes Absolute: 0.4 10*3/uL (ref 0.1–1.0)
Monocytes Relative: 7 %
Neutro Abs: 3.6 10*3/uL (ref 1.7–7.7)
Neutrophils Relative %: 67 %
Platelets: 283 10*3/uL (ref 150–400)
RBC: 3.75 MIL/uL — ABNORMAL LOW (ref 3.87–5.11)
RDW: 14.8 % (ref 11.5–15.5)
WBC: 5.4 10*3/uL (ref 4.0–10.5)
nRBC: 0 % (ref 0.0–0.2)

## 2021-07-02 LAB — IRON AND TIBC
Iron: 61 ug/dL (ref 28–170)
Saturation Ratios: 19 % (ref 10.4–31.8)
TIBC: 330 ug/dL (ref 250–450)
UIBC: 269 ug/dL

## 2021-07-02 LAB — FERRITIN: Ferritin: 47 ng/mL (ref 11–307)

## 2021-07-10 DIAGNOSIS — H903 Sensorineural hearing loss, bilateral: Secondary | ICD-10-CM | POA: Diagnosis not present

## 2021-07-10 DIAGNOSIS — H6121 Impacted cerumen, right ear: Secondary | ICD-10-CM | POA: Diagnosis not present

## 2021-07-10 DIAGNOSIS — H838X3 Other specified diseases of inner ear, bilateral: Secondary | ICD-10-CM | POA: Diagnosis not present

## 2021-07-10 NOTE — Progress Notes (Signed)
Virtual Visit via Telephone Note [facility]  I connected with Haley Trevino  on 07/11/21 at  3:06 PM by telephone and verified that I am speaking with the correct person using two identifiers.  Location: Patient: Home Provider: [facility]   I discussed the limitations, risks, security and privacy concerns of performing an evaluation and management service by telephone and the availability of in person appointments. I also discussed with the patient that there may be a patient responsible charge related to this service. The patient expressed understanding and agreed to proceed.   REASON FOR VISIT:  Follow-up for iron deficiency anemia   CURRENT THERAPY: Intermittent IV iron infusions (last Venofer 04/17/2021) and PRBC transfusions (last blood transfusion on 12/14/2020)   INTERVAL HISTORY:  [name] 81 y.o. female returns for routine follow-up of iron deficiency anemia secondary to chronic GI blood loss.  She was last seen by Tarri Abernethy PA-C on 04/01/2021.  Most recent IV iron was on 04/17/2021.  At today's visit, she reports feeling significantly fatigued.  She has not had any recent hospitalizations, surgeries, or new diagnoses.   She reports that she did feel improved energy after IV iron (04/03/2021 - 04/17/2021), but over the past month she has been noticing progressive fatigue again.  She reports that she feels "weak and shaky," and tires quickly with exertion.  She also notes some dyspnea on exertion and dizziness with standing.    Her melena has resolved, and she has not noted any black stool since mid August.  She denies any hematemesis, hematochezia, epistaxis, or other sources of blood loss.     She continues to suffer from chronic chest wall pain secondary to complications and scarring from removal of breast implants, as well as her scleroderma.  She has multiple other complaints unrelated to today's visit including vertigo, hearing  loss, worsening vision, and bilateral arm pain.  She feels stressed as she is trying to keep up with her own health issues as well as her husband's history of stroke and lung cancer several years ago.  She has little to no energy and 50% appetite.  She reports that she is maintaining a stable weight at this time.    OBSERVATIONS/OBJECTIVE: Review of Systems  Constitutional:  Positive for malaise/fatigue. Negative for chills, diaphoresis, fever and weight loss.  HENT:  Positive for hearing loss.   Eyes:  Positive for blurred vision.  Respiratory:  Positive for shortness of breath (with exertion). Negative for cough.   Cardiovascular:  Positive for chest pain (chest wall pain, scarring from previous breast implants). Negative for palpitations.  Gastrointestinal:  Negative for abdominal pain, blood in stool, melena, nausea and vomiting.  Neurological:  Positive for dizziness (vertigo) and tingling (hands). Negative for headaches.  Psychiatric/Behavioral:  The patient is nervous/anxious and has insomnia.     PHYSICAL EXAM (per limitations of virtual telephone visit): The patient is alert and oriented x 3, exhibiting adequate mentation, good mood, and ability to speak in full sentences and execute sound judgement.   ASSESSMENT & PLAN: 1.  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 Watt Climes via [facility]), most recent appointment on 05/03/2021. - She reports that her dark stool/melena resolved in August 2022  - Labs (07/02/2021): Hgb 11.3 with MCV 93.1, ferritin 47, iron saturation 19 % - She is symptomatic with significant fatigue, which is previously improved after IV iron infusions  - Differential diagnosis favors iron deficiency anemia secondary to chronic occult GI blood loss; may also be an aspect of malabsorption in the setting of scleroderma, low meat intake, and use of PPI - PLAN: Recommend additional IV iron with Venofer 300 mg x3. - RTC in 2 months with repeat labs, followed by phone visit. - Continue follow-up with gastroenterology for ongoing management of GI bleeding.   FOLLOW UP INSTRUCTIONS: IV Venofer 300 mg x 3 doses Labs in 2 months Phone visit after labs  I discussed the assessment and treatment plan with the patient. The patient was provided an opportunity to ask questions and all were answered. The patient agreed with the plan and demonstrated an understanding of the instructions.   The patient was advised to call back or seek an in-person evaluation if the symptoms worsen or if the condition fails to improve as anticipated.  I provided 23 minutes of non-face-to-face time during this encounter.   Haley Rush, PA-C 07/11/21 4:37 PM

## 2021-07-11 ENCOUNTER — Inpatient Hospital Stay (HOSPITAL_COMMUNITY): Payer: Medicare Other | Attending: Hematology | Admitting: Physician Assistant

## 2021-07-11 ENCOUNTER — Other Ambulatory Visit: Payer: Self-pay

## 2021-07-11 DIAGNOSIS — D500 Iron deficiency anemia secondary to blood loss (chronic): Secondary | ICD-10-CM

## 2021-07-11 DIAGNOSIS — R202 Paresthesia of skin: Secondary | ICD-10-CM

## 2021-07-11 DIAGNOSIS — D509 Iron deficiency anemia, unspecified: Secondary | ICD-10-CM | POA: Insufficient documentation

## 2021-07-11 DIAGNOSIS — D649 Anemia, unspecified: Secondary | ICD-10-CM | POA: Diagnosis not present

## 2021-07-15 ENCOUNTER — Inpatient Hospital Stay (HOSPITAL_COMMUNITY): Payer: Medicare Other

## 2021-07-15 ENCOUNTER — Other Ambulatory Visit: Payer: Self-pay

## 2021-07-15 VITALS — BP 132/51 | HR 63 | Temp 97.4°F | Resp 18 | Ht 62.0 in | Wt 110.0 lb

## 2021-07-15 DIAGNOSIS — D509 Iron deficiency anemia, unspecified: Secondary | ICD-10-CM | POA: Diagnosis not present

## 2021-07-15 DIAGNOSIS — D500 Iron deficiency anemia secondary to blood loss (chronic): Secondary | ICD-10-CM

## 2021-07-15 MED ORDER — SODIUM CHLORIDE 0.9 % IV SOLN
300.0000 mg | Freq: Once | INTRAVENOUS | Status: AC
  Administered 2021-07-15: 300 mg via INTRAVENOUS
  Filled 2021-07-15: qty 300

## 2021-07-15 MED ORDER — SODIUM CHLORIDE 0.9 % IV SOLN: Freq: Once | INTRAVENOUS | Status: AC

## 2021-07-15 MED ORDER — ACETAMINOPHEN 325 MG PO TABS
650.0000 mg | ORAL_TABLET | Freq: Once | ORAL | Status: AC
  Administered 2021-07-15: 650 mg via ORAL
  Filled 2021-07-15: qty 2

## 2021-07-15 MED ORDER — LORATADINE 10 MG PO TABS
10.0000 mg | ORAL_TABLET | Freq: Once | ORAL | Status: AC
  Administered 2021-07-15: 10 mg via ORAL
  Filled 2021-07-15: qty 1

## 2021-07-15 NOTE — Patient Instructions (Signed)
[facility]  Discharge Instructions: Thank you for choosing [facility] to provide your oncology and hematology care.  If you have a lab appointment with the Cancer Center, please come in thru the Main Entrance and check in at the main information desk.  Wear comfortable clothing and clothing appropriate for easy access to any Portacath or PICC line.   We strive to give you quality time with your provider. You may need to reschedule your appointment if you arrive late (15 or more minutes).  Arriving late affects you and other patients whose appointments are after yours.  Also, if you miss three or more appointments without notifying the office, you may be dismissed from the clinic at the provider's discretion.      For prescription refill requests, have your pharmacy contact our office and allow 72 hours for refills to be completed.    Today you received Venofer IV iron infusion.     BELOW ARE SYMPTOMS THAT SHOULD BE REPORTED IMMEDIATELY: *FEVER GREATER THAN 100.4 F (38 C) OR HIGHER *CHILLS OR SWEATING *NAUSEA AND VOMITING THAT IS NOT CONTROLLED WITH YOUR NAUSEA MEDICATION *UNUSUAL SHORTNESS OF BREATH *UNUSUAL BRUISING OR BLEEDING *URINARY PROBLEMS (pain or burning when urinating, or frequent urination) *BOWEL PROBLEMS (unusual diarrhea, constipation, pain near the anus) TENDERNESS IN MOUTH AND THROAT WITH OR WITHOUT PRESENCE OF ULCERS (sore throat, sores in mouth, or a toothache) UNUSUAL RASH, SWELLING OR PAIN  UNUSUAL VAGINAL DISCHARGE OR ITCHING   Items with * indicate a potential emergency and should be followed up as soon as possible or go to the Emergency Department if any problems should occur.  Please show the CHEMOTHERAPY ALERT CARD or IMMUNOTHERAPY ALERT CARD at check-in to the Emergency Department and triage nurse.  Should you have questions after your visit or need to cancel or reschedule your appointment, please contact [facility]  [phone]  and follow the prompts.  Office hours are 8:00 a.m. to 4:30 p.m. Monday - Friday. Please note that voicemails left after 4:00 p.m. may not be returned until the following business day.  We are closed weekends and major holidays. You have access to a nurse at all times for urgent questions. Please call the main number to the clinic [phone] and follow the prompts.  For any non-urgent questions, you may also contact your provider using MyChart. We now offer e-Visits for anyone 18 and older to request care online for non-urgent symptoms. For details visit [email or web address].   Also download the MyChart app! Go to the app store, search "MyChart", open the app, select [facility], and log in with your MyChart username and password.  Due to Covid, a mask is required upon entering the hospital/clinic. If you do not have a mask, one will be given to you upon arrival. For doctor visits, patients may have 1 support person aged 18 or older with them. For treatment visits, patients cannot have anyone with them due to current Covid guidelines and our immunocompromised population.  

## 2021-07-15 NOTE — Progress Notes (Signed)
Pt presents today for Venofer IV iron infusion per provider's order. Vital signs stable and pt voiced no new complaints at this time.  Peripheral IV started with good blood return pre and post infusion.  Venofer 300  given today per MD orders. Tolerated infusion without adverse affects. Vital signs stable. No complaints at this time. [name] Alert and oriented x 3. F/U with [facility] as scheduled.   

## 2021-07-22 ENCOUNTER — Inpatient Hospital Stay (HOSPITAL_COMMUNITY): Payer: Medicare Other

## 2021-07-25 ENCOUNTER — Telehealth: Payer: Self-pay

## 2021-07-29 ENCOUNTER — Other Ambulatory Visit: Payer: Self-pay

## 2021-07-29 ENCOUNTER — Inpatient Hospital Stay (HOSPITAL_COMMUNITY): Payer: Medicare Other

## 2021-07-29 ENCOUNTER — Encounter (HOSPITAL_COMMUNITY): Payer: Self-pay

## 2021-07-29 VITALS — BP 137/53 | HR 70 | Temp 98.0°F | Resp 16 | Ht 62.0 in | Wt 110.4 lb

## 2021-07-29 DIAGNOSIS — D509 Iron deficiency anemia, unspecified: Secondary | ICD-10-CM | POA: Diagnosis not present

## 2021-07-29 DIAGNOSIS — D500 Iron deficiency anemia secondary to blood loss (chronic): Secondary | ICD-10-CM

## 2021-07-29 MED ORDER — ACETAMINOPHEN 325 MG PO TABS
650.0000 mg | ORAL_TABLET | Freq: Once | ORAL | Status: AC
  Administered 2021-07-29: 650 mg via ORAL
  Filled 2021-07-29: qty 2

## 2021-07-29 MED ORDER — SODIUM CHLORIDE 0.9 % IV SOLN
300.0000 mg | Freq: Once | INTRAVENOUS | Status: AC
  Administered 2021-07-29: 300 mg via INTRAVENOUS
  Filled 2021-07-29: qty 300

## 2021-07-29 MED ORDER — LORATADINE 10 MG PO TABS
10.0000 mg | ORAL_TABLET | Freq: Once | ORAL | Status: AC
  Administered 2021-07-29: 10 mg via ORAL
  Filled 2021-07-29: qty 1

## 2021-07-29 MED ORDER — SODIUM CHLORIDE 0.9 % IV SOLN: Freq: Once | INTRAVENOUS | Status: AC

## 2021-07-29 NOTE — Patient Instructions (Signed)
[facility]  Discharge Instructions: Thank you for choosing [facility] to provide your oncology and hematology care.  If you have a lab appointment with the Cancer Center, please come in thru the Main Entrance and check in at the main information desk.  Wear comfortable clothing and clothing appropriate for easy access to any Portacath or PICC line.   We strive to give you quality time with your provider. You may need to reschedule your appointment if you arrive late (15 or more minutes).  Arriving late affects you and other patients whose appointments are after yours.  Also, if you miss three or more appointments without notifying the office, you may be dismissed from the clinic at the provider's discretion.      For prescription refill requests, have your pharmacy contact our office and allow 72 hours for refills to be completed.    Today you received the following: Venofer, return as scheduled.   To help prevent nausea and vomiting after your treatment, we encourage you to take your nausea medication as directed.  BELOW ARE SYMPTOMS THAT SHOULD BE REPORTED IMMEDIATELY: *FEVER GREATER THAN 100.4 F (38 C) OR HIGHER *CHILLS OR SWEATING *NAUSEA AND VOMITING THAT IS NOT CONTROLLED WITH YOUR NAUSEA MEDICATION *UNUSUAL SHORTNESS OF BREATH *UNUSUAL BRUISING OR BLEEDING *URINARY PROBLEMS (pain or burning when urinating, or frequent urination) *BOWEL PROBLEMS (unusual diarrhea, constipation, pain near the anus) TENDERNESS IN MOUTH AND THROAT WITH OR WITHOUT PRESENCE OF ULCERS (sore throat, sores in mouth, or a toothache) UNUSUAL RASH, SWELLING OR PAIN  UNUSUAL VAGINAL DISCHARGE OR ITCHING   Items with * indicate a potential emergency and should be followed up as soon as possible or go to the Emergency Department if any problems should occur.  Please show the CHEMOTHERAPY ALERT CARD or IMMUNOTHERAPY ALERT CARD at check-in to the Emergency Department and triage  nurse.  Should you have questions after your visit or need to cancel or reschedule your appointment, please contact [facility] [phone]  and follow the prompts.  Office hours are 8:00 a.m. to 4:30 p.m. Monday - Friday. Please note that voicemails left after 4:00 p.m. may not be returned until the following business day.  We are closed weekends and major holidays. You have access to a nurse at all times for urgent questions. Please call the main number to the clinic [phone] and follow the prompts.  For any non-urgent questions, you may also contact your provider using MyChart. We now offer e-Visits for anyone 18 and older to request care online for non-urgent symptoms. For details visit [email or web address].   Also download the MyChart app! Go to the app store, search "MyChart", open the app, select [facility], and log in with your MyChart username and password.  Due to Covid, a mask is required upon entering the hospital/clinic. If you do not have a mask, one will be given to you upon arrival. For doctor visits, patients may have 1 support person aged 18 or older with them. For treatment visits, patients cannot have anyone with them due to current Covid guidelines and our immunocompromised population.  

## 2021-07-29 NOTE — Progress Notes (Signed)
Patient tolerated iron infusion with no complaints voiced.  Peripheral IV site clean and dry with good blood return noted before and after infusion.  Band aid applied.  VSS with discharge and left in satisfactory condition with no s/s of distress noted.   

## 2021-07-31 NOTE — Progress Notes (Signed)
Error

## 2021-08-06 ENCOUNTER — Encounter (HOSPITAL_COMMUNITY): Payer: Self-pay

## 2021-08-06 ENCOUNTER — Other Ambulatory Visit: Payer: Self-pay

## 2021-08-06 ENCOUNTER — Inpatient Hospital Stay (HOSPITAL_COMMUNITY): Payer: Medicare Other

## 2021-08-06 VITALS — BP 112/57 | HR 73 | Temp 98.2°F | Resp 17 | Ht 62.0 in | Wt 110.8 lb

## 2021-08-06 DIAGNOSIS — D500 Iron deficiency anemia secondary to blood loss (chronic): Secondary | ICD-10-CM

## 2021-08-06 DIAGNOSIS — D509 Iron deficiency anemia, unspecified: Secondary | ICD-10-CM | POA: Diagnosis not present

## 2021-08-06 MED ORDER — ACETAMINOPHEN 325 MG PO TABS
650.0000 mg | ORAL_TABLET | Freq: Once | ORAL | Status: AC
  Administered 2021-08-06: 650 mg via ORAL
  Filled 2021-08-06: qty 2

## 2021-08-06 MED ORDER — SODIUM CHLORIDE 0.9 % IV SOLN
300.0000 mg | Freq: Once | INTRAVENOUS | Status: AC
  Administered 2021-08-06: 300 mg via INTRAVENOUS
  Filled 2021-08-06: qty 300

## 2021-08-06 MED ORDER — SODIUM CHLORIDE 0.9 % IV SOLN: Freq: Once | INTRAVENOUS | Status: AC

## 2021-08-06 MED ORDER — LORATADINE 10 MG PO TABS
10.0000 mg | ORAL_TABLET | Freq: Once | ORAL | Status: AC
  Administered 2021-08-06: 10 mg via ORAL
  Filled 2021-08-06: qty 1

## 2021-08-06 NOTE — Progress Notes (Signed)
Patient tolerated iron infusion with no complaints voiced.  Peripheral IV site clean and dry with good blood return noted before and after infusion.  Band aid applied.  VSS with discharge and left in satisfactory condition with no s/s of distress noted.   

## 2021-08-06 NOTE — Patient Instructions (Signed)
[facility]  Discharge Instructions: Thank you for choosing [facility] to provide your oncology and hematology care.  If you have a lab appointment with the Cancer Center, please come in thru the Main Entrance and check in at the main information desk.  Wear comfortable clothing and clothing appropriate for easy access to any Portacath or PICC line.   We strive to give you quality time with your provider. You may need to reschedule your appointment if you arrive late (15 or more minutes).  Arriving late affects you and other patients whose appointments are after yours.  Also, if you miss three or more appointments without notifying the office, you may be dismissed from the clinic at the provider's discretion.      For prescription refill requests, have your pharmacy contact our office and allow 72 hours for refills to be completed.    Today you received the following: Venofer, return as scheduled.   To help prevent nausea and vomiting after your treatment, we encourage you to take your nausea medication as directed.  BELOW ARE SYMPTOMS THAT SHOULD BE REPORTED IMMEDIATELY: *FEVER GREATER THAN 100.4 F (38 C) OR HIGHER *CHILLS OR SWEATING *NAUSEA AND VOMITING THAT IS NOT CONTROLLED WITH YOUR NAUSEA MEDICATION *UNUSUAL SHORTNESS OF BREATH *UNUSUAL BRUISING OR BLEEDING *URINARY PROBLEMS (pain or burning when urinating, or frequent urination) *BOWEL PROBLEMS (unusual diarrhea, constipation, pain near the anus) TENDERNESS IN MOUTH AND THROAT WITH OR WITHOUT PRESENCE OF ULCERS (sore throat, sores in mouth, or a toothache) UNUSUAL RASH, SWELLING OR PAIN  UNUSUAL VAGINAL DISCHARGE OR ITCHING   Items with * indicate a potential emergency and should be followed up as soon as possible or go to the Emergency Department if any problems should occur.  Please show the CHEMOTHERAPY ALERT CARD or IMMUNOTHERAPY ALERT CARD at check-in to the Emergency Department and triage  nurse.  Should you have questions after your visit or need to cancel or reschedule your appointment, please contact  CANCER CENTER [phone]  and follow the prompts.  Office hours are 8:00 a.m. to 4:30 p.m. Monday - Friday. Please note that voicemails left after 4:00 p.m. may not be returned until the following business day.  We are closed weekends and major holidays. You have access to a nurse at all times for urgent questions. Please call the main number to the clinic [phone] and follow the prompts.  For any non-urgent questions, you may also contact your provider using MyChart. We now offer e-Visits for anyone 18 and older to request care online for non-urgent symptoms. For details visit [email or web address].   Also download the MyChart app! Go to the app store, search "MyChart", open the app, select , and log in with your MyChart username and password.  Due to Covid, a mask is required upon entering the hospital/clinic. If you do not have a mask, one will be given to you upon arrival. For doctor visits, patients may have 1 support person aged 18 or older with them. For treatment visits, patients cannot have anyone with them due to current Covid guidelines and our immunocompromised population.  

## 2021-08-09 ENCOUNTER — Telehealth: Payer: Medicare Other

## 2021-08-16 ENCOUNTER — Ambulatory Visit (INDEPENDENT_AMBULATORY_CARE_PROVIDER_SITE_OTHER): Payer: Medicare Other | Admitting: Nurse Practitioner

## 2021-08-16 ENCOUNTER — Encounter: Payer: Self-pay | Admitting: Nurse Practitioner

## 2021-08-16 VITALS — BP 123/56 | HR 76 | Temp 98.0°F | Resp 20 | Ht 62.0 in | Wt 107.0 lb

## 2021-08-16 DIAGNOSIS — K219 Gastro-esophageal reflux disease without esophagitis: Secondary | ICD-10-CM | POA: Diagnosis not present

## 2021-08-16 DIAGNOSIS — F3342 Major depressive disorder, recurrent, in full remission: Secondary | ICD-10-CM | POA: Diagnosis not present

## 2021-08-16 DIAGNOSIS — D500 Iron deficiency anemia secondary to blood loss (chronic): Secondary | ICD-10-CM | POA: Diagnosis not present

## 2021-08-16 DIAGNOSIS — I7300 Raynaud's syndrome without gangrene: Secondary | ICD-10-CM | POA: Diagnosis not present

## 2021-08-16 DIAGNOSIS — E785 Hyperlipidemia, unspecified: Secondary | ICD-10-CM

## 2021-08-16 DIAGNOSIS — I10 Essential (primary) hypertension: Secondary | ICD-10-CM

## 2021-08-16 DIAGNOSIS — M349 Systemic sclerosis, unspecified: Secondary | ICD-10-CM | POA: Diagnosis not present

## 2021-08-16 DIAGNOSIS — E039 Hypothyroidism, unspecified: Secondary | ICD-10-CM | POA: Diagnosis not present

## 2021-08-16 DIAGNOSIS — F419 Anxiety disorder, unspecified: Secondary | ICD-10-CM | POA: Diagnosis not present

## 2021-08-16 DIAGNOSIS — K582 Mixed irritable bowel syndrome: Secondary | ICD-10-CM | POA: Diagnosis not present

## 2021-08-16 MED ORDER — EZETIMIBE 10 MG PO TABS: 10.0000 mg | ORAL_TABLET | Freq: Every day | ORAL | 1 refills | Status: DC

## 2021-08-16 MED ORDER — OMEPRAZOLE 20 MG PO CPDR: DELAYED_RELEASE_CAPSULE | ORAL | 1 refills | Status: DC

## 2021-08-16 MED ORDER — SYNTHROID 75 MCG PO TABS: ORAL_TABLET | ORAL | 1 refills | Status: DC

## 2021-08-16 MED ORDER — SILDENAFIL CITRATE 20 MG PO TABS: 20.0000 mg | ORAL_TABLET | Freq: Two times a day (BID) | ORAL | 1 refills | Status: DC

## 2021-08-16 MED ORDER — CLONAZEPAM 0.5 MG PO TABS: 0.5000 mg | ORAL_TABLET | Freq: Two times a day (BID) | ORAL | 1 refills | Status: DC | PRN

## 2021-08-16 MED ORDER — NIFEDIPINE ER OSMOTIC RELEASE 60 MG PO TB24: 60.0000 mg | ORAL_TABLET | Freq: Every day | ORAL | 1 refills | Status: DC

## 2021-08-16 MED ORDER — FLUOXETINE HCL 40 MG PO CAPS: 40.0000 mg | ORAL_CAPSULE | Freq: Every day | ORAL | 1 refills | Status: DC

## 2021-08-16 NOTE — Progress Notes (Signed)
Subjective:    Patient ID: Haley Trevino, female    DOB: 08-31-1940, 81 y.o.   MRN: [id number]   Chief Complaint: medical management of chronic issues     HPI:  Haley Trevino is a 81 y.o. who identifies as a female who was assigned female at birth.   Social history: Lives with: husband Work history: retired   Scientist, forensic in today for follow up of the following chronic medical issues:  1. Primary hypertension No c/o chest pain, sob or headache. Does not check blood pressure at ome very often. BP Readings from Last 3 Encounters:  08/06/21 (!) 112/57  07/29/21 (!) 137/53  07/15/21 (!) 132/51     2. Hyperlipidemia with target LDL less than 100 Has a very poor appetite. Lab Results  Component Value Date   CHOL 174 02/13/2021   HDL 50 02/13/2021   LDLCALC 98 02/13/2021   TRIG 148 02/13/2021   CHOLHDL 3.5 02/13/2021   The ASCVD Risk score (Arnett DK, et al., 2019) failed to calculate for the following reasons:   The 2019 ASCVD risk score is only valid for ages 71 to 39   3. Irritable bowel syndrome with both constipation and diarrhea Flip floppiing back and forth but diarrhea has gotten worse. She wants to be tested for celiac disease.  4. Gastroesophageal reflux disease without esophagitis Is on omperazole daily  5. Acquired hypothyroidism No problems that she is aware of. Lab Results  Component Value Date   TSH 1.410 11/12/2020     6. [name] without gangrene [place] [facility] are usually the worst for her. She keeps hand warmers available all the time. Hsa no sores on her fingers currently.  7. SCLERODERMA Stopped gong to rheumatology about 2 years ago. She wonders if haow she is feeling is from worsening scleroderma.  8. Iron deficiency anemia due to chronic blood loss Not sure where she is losing blood from. She has had a total of 6 iron infusions. Lab Results  Component Value Date   HGB 11.3 (L) 07/02/2021     9. Recurrent major depressive  disorder, in full remission ([place]) Has been on prozac for several years. Depression screen [facility] 2/9 06/14/2021 04/29/2021 04/08/2021  Decreased Interest _0 Down, Depressed, Hopeless _1 PHQ - 2 Score _2 Altered sleeping _3 Tired, decreased energy _4 Change in appetite _5 Feeling bad or failure about yourself  0 0 1  Trouble concentrating 0 0 0  Moving slowly or fidgety/restless 1 0 1  Suicidal thoughts 0 0 0  PHQ-9 Score _6 Difficult doing work/chores Somewhat difficult Somewhat difficult Somewhat difficult  Some recent data might be hidden     New complaints: She just says she has no energy.  Allergies  Allergen Reactions   Dilaudid [Hydromorphone Hcl] Anaphylaxis   Dilaudid [Hydromorphone] Anaphylaxis   Acyclovir And Related     Unknown reaction   Crestor [Rosuvastatin Calcium] Other (See Comments)    Muscle pain   Gabapentin Swelling    Swelling in feet, and in legs   Aleve [Naproxen Sodium] Rash   Lyrica [Pregabalin] Other (See Comments)    Swelling in feet, and in legs   Outpatient Encounter Medications as of 08/16/2021  Medication Sig   acetaminophen (TYLENOL) 500 MG tablet Take 1,000 mg by mouth every 6 (six) hours as needed for moderate pain or headache. (Patient not taking:  Reported on 07/29/2021)   CALCIUM PO Take by mouth.   Cholecalciferol (VITAMIN D) 50 MCG (2000 UT) tablet Take 2,000 Units by mouth daily.   clonazePAM (KLONOPIN) 0.5 MG tablet Take 1 tablet (0.5 mg total) by mouth 2 (two) times daily as needed for anxiety. (Patient not taking: Reported on 07/29/2021)   ezetimibe (ZETIA) 10 MG tablet Take 1 tablet (10 mg total) by mouth daily.   FLUoxetine (PROZAC) 40 MG capsule Take 1 capsule (40 mg total) by mouth daily.   fluticasone (FLONASE) 50 MCG/ACT nasal spray PLACE 2 SPRAYS INTO BOTH NOSTRILS DAILY   NIFEdipine (PROCARDIA XL/NIFEDICAL XL) 60 MG 24 hr tablet Take 1 tablet (60 mg total) by mouth daily.   omeprazole (PRILOSEC) 20 MG  capsule TAKE 1 TABLET DAILY   PROLIA 60 MG/ML SOSY injection Inject 60 mg into the skin every 6 (six) months.   RESTASIS 0.05 % ophthalmic emulsion Place 1 drop into both eyes daily.   sildenafil (REVATIO) 20 MG tablet Take 1 tablet (20 mg total) by mouth 2 (two) times daily.   SYNTHROID 75 MCG tablet TAKE (1) TABLET DAILY BE- FORE BREAKFAST.   No facility-administered encounter medications on file as of 08/16/2021.    Past Surgical History:  Procedure Laterality Date   BREAST ENHANCEMENT SURGERY  1975   BREAST IMPLANT REMOVAL Bilateral 04/23/2016   Procedure: REMOVALBILATERAL BREAST IMPLANTS;  Surgeon: Wallace Going, DO;  Location: [place];  Service: Plastics;  Laterality: Bilateral;   CHOLECYSTECTOMY  1973   ESOPHAGOGASTRODUODENOSCOPY N/A 10/25/2020   Procedure: ESOPHAGOGASTRODUODENOSCOPY (EGD);  Surgeon: Clarene Essex, MD;  Location: [facility];  Service: Endoscopy;  Laterality: N/A;  enteroscopy   HAND SURGERY  08/2012; 11/2012   HOT HEMOSTASIS N/A 10/25/2020   Procedure: HOT HEMOSTASIS (ARGON PLASMA COAGULATION/BICAP);  Surgeon: Clarene Essex, MD;  Location: [facility];  Service: Endoscopy;  Laterality: N/A;   IR RADIOLOGIST EVAL & MGMT  07/27/2017   IR SACROPLASTY BILATERAL  07/31/2017   IR VERTEBROPLASTY CERV/THOR BX INC UNI/BIL INC/INJECT/IMAGING  03/13/2020   IR VERTEBROPLASTY EA ADDL (T&LS) BX INC UNI/BIL INC INJECT/IMAGING  03/14/2020   MELANOMA EXCISION     at 31 yrs of age   ORIF HIP FRACTURE Right 02/22/2014   Procedure: OPEN REDUCTION INTERNAL FIXATION RIGHT HIP;  Surgeon: Sanjuana Kava, MD;  Location: [facility];  Service: Orthopedics;  Laterality: Right;   TOTAL ABDOMINAL HYSTERECTOMY  1974    Family History  Problem Relation Age of Onset   Pancreatic cancer Father    Raynaud syndrome Father    Cancer Father        pancreatic   Rashes / Skin problems Daughter        possibly scleraderma   Hip fracture Mother    Mental illness Mother        attempted  suicide at 80 yo      Controlled substance contract: n/a     Review of Systems  Constitutional:  Negative for diaphoresis.  Eyes:  Negative for pain.  Respiratory:  Negative for shortness of breath.   Cardiovascular:  Negative for chest pain, palpitations and leg swelling.  Gastrointestinal:  Negative for abdominal pain.  Endocrine: Negative for polydipsia.  Skin:  Negative for rash.  Neurological:  Negative for dizziness, weakness and headaches.  Hematological:  Does not bruise/bleed easily.  All other systems reviewed and are negative.     Objective:   Physical Exam Vitals and nursing note reviewed.  Constitutional:  General: She is not in acute distress.    Appearance: Normal appearance. She is well-developed.  HENT:     Head: Normocephalic.     Right Ear: Tympanic membrane normal.     Left Ear: Tympanic membrane normal.     Nose: Nose normal.     Mouth/Throat:     Mouth: Mucous membranes are moist.  Eyes:     Pupils: Pupils are equal, round, and reactive to light.  Neck:     Vascular: No carotid bruit or JVD.  Cardiovascular:     Rate and Rhythm: Normal rate and regular rhythm.     Heart sounds: Normal heart sounds.  Pulmonary:     Effort: Pulmonary effort is normal. No respiratory distress.     Breath sounds: Normal breath sounds. No wheezing or rales.  Chest:     Chest wall: No tenderness.  Abdominal:     General: Bowel sounds are normal. There is no distension or abdominal bruit.     Palpations: Abdomen is soft. There is no hepatomegaly, splenomegaly, mass or pulsatile mass.     Tenderness: There is no abdominal tenderness.  Musculoskeletal:        General: Normal range of motion.     Cervical back: Normal range of motion and neck supple.  Lymphadenopathy:     Cervical: No cervical adenopathy.  Skin:    General: Skin is warm and dry.     Comments: Bil hands and fingers are cold to touch  Neurological:     Mental Status: She is alert and oriented  to person, place, and time.     Deep Tendon Reflexes: Reflexes are normal and symmetric.  Psychiatric:        Behavior: Behavior normal.        Thought Content: Thought content normal.        Judgment: Judgment normal.    BP (!) 123/56    Pulse 76    Temp 98 F (36.7 C) (Temporal)    Resp 20    Ht _0  (1.575 m)    Wt 107 lb (48.5 kg)    SpO2 96%    BMI 19.57 kg/m        Assessment & Plan:  Haley Trevino comes in today with chief complaint of Medical Management of Chronic Issues   Diagnosis and orders addressed:  1. Primary hypertension Low sodium diet - CBC with Differential/Platelet - CMP14+EGFR  2. Hyperlipidemia with target LDL less than 100 Low fat diet - Lipid panel - ezetimibe (ZETIA) 10 MG tablet; Take 1 tablet (10 mg total) by mouth daily.  Dispense: 90 tablet; Refill: 1  3. Irritable bowel syndrome with both constipation and diarrhea Increase fiber in diet - Celiac Disease Panel  4. Gastroesophageal reflux disease without esophagitis Avoid spicy foods Do not eat 2 hours prior to bedtime - omeprazole (PRILOSEC) 20 MG capsule; TAKE 1 TABLET DAILY  Dispense: 90 capsule; Refill: 1  5. Acquired hypothyroidism Labs oending - Thyroid Panel With TSH - SYNTHROID 75 MCG tablet; TAKE (1) TABLET DAILY BE- FORE BREAKFAST.  Dispense: 90 tablet; Refill: 1  6. [name] without gangrene Keep hands wram - NIFEdipine (PROCARDIA XL/NIFEDICAL XL) 60 MG 24 hr tablet; Take 1 tablet (60 mg total) by mouth daily.  Dispense: 90 tablet; Refill: 1 - sildenafil (REVATIO) 20 MG tablet; Take 1 tablet (20 mg total) by mouth 2 (two) times daily.  Dispense: 180 tablet; Refill: 1  7. SCLERODERMA May want to consider  going back to rheumatology  8. Iron deficiency anemia due to chronic blood loss Labs pending  9. Recurrent major depressive disorder, in full remission ([place]) Stress management - FLUoxetine (PROZAC) 40 MG capsule; Take 1 capsule (40 mg total) by mouth daily.   Dispense: 90 capsule; Refill: 1  10. Anxiety Stress management - clonazePAM (KLONOPIN) 0.5 MG tablet; Take 1 tablet (0.5 mg total) by mouth 2 (two) times daily as needed for anxiety.  Dispense: 180 tablet; Refill: 1   Labs pending Health Maintenance reviewed Diet and exercise encouraged  Follow up plan: 6 months   Mary-Margaret Hassell Done, FNP

## 2021-08-16 NOTE — Patient Instructions (Signed)
Stress, Adult °Stress is a normal reaction to life events. Stress is what you feel when life demands more than you are used to, or more than you think you can handle. °Some stress can be useful, such as studying for a test or meeting a deadline at work. Stress that occurs too often or for too long can cause problems. Long-lasting stress is called chronic stress. Chronic stress can affect your emotional health and interfere with relationships and normal daily activities. °Too much stress can weaken your body's defense system (immune system) and increase your risk for physical illness. If you already have a medical problem, stress can make it worse. °What are the causes? °All sorts of life events can cause stress. An event that causes stress for one person may not be stressful for someone else. Major life events, whether positive or negative, commonly cause stress. Examples include: °Losing a job or starting a new job. °Losing a loved one. °Moving to a new town or home. °Getting married or divorced. °Having a baby. °Getting injured or sick. °Less obvious life events can also cause stress, especially if they occur day after day or in combination with each other. Examples include: °Working long hours. °Driving in traffic. °Caring for children. °Being in debt. °Being in a difficult relationship. °What are the signs or symptoms? °Stress can cause emotional and physical symptoms and can lead to unhealthy behaviors. These include the following: °Emotional symptoms °Anxiety. This is feeling worried, afraid, on edge, overwhelmed, or out of control. °Anger, including irritation or impatience. °Depression. This is feeling sad, down, helpless, or guilty. °Trouble focusing, remembering, or making decisions. °Physical symptoms °Aches and pains. These may affect your head, neck, back, stomach, or other areas of your body. °Tight muscles or a clenched jaw. °Low energy. °Trouble sleeping. °Unhealthy behaviors °Eating to feel better  (overeating) or skipping meals. °Working too much or putting off tasks. °Smoking, drinking alcohol, or using drugs to feel better. °How is this diagnosed? °A stress disorder is diagnosed through an assessment by your health care provider. A stress disorder may be diagnosed based on: °Your symptoms and any stressful life events. °Your medical history. °Tests to rule out other causes of your symptoms. °Depending on your condition, your health care provider may refer you to a specialist for further evaluation. °How is this treated? °Stress management techniques are the recommended treatment for stress. Medicine is not typically recommended for treating stress. °Techniques to reduce your reaction to stressful life events include: °Identifying stress. Monitor yourself for symptoms of stress and notice what causes stress for you. These skills may help you to avoid or prepare for stressful events. °Managing time. Set your priorities, keep a calendar of events, and learn to say no. These actions can help you avoid taking on too much. °Techniques for dealing with stress include: °Rethinking the problem. Try to think realistically about stressful events rather than ignoring them or overreacting. Try to find the positives in a stressful situation rather than focusing on the negatives. °Exercise. Physical exercise can release both physical and emotional tension. The key is to find a form of exercise that you enjoy and do it regularly. °Relaxation techniques. These relax the body and mind. Find one or more that you enjoy and use the techniques regularly. Examples include: °Meditation, deep breathing, or progressive relaxation techniques. °Yoga or tai chi. °Biofeedback, mindfulness techniques, or journaling. °Listening to music, being in nature, or taking part in other hobbies. °Practicing a healthy lifestyle. Eat a balanced diet,   drink plenty of water, limit or avoid caffeine, and get plenty of sleep. Having a strong support  network. Spend time with family, friends, or other people you enjoy being around. Express your feelings and talk things over with someone you trust. Counseling or talk therapy with a mental health provider may help if you are having trouble managing stress by yourself. Follow these instructions at home: Lifestyle  Avoid drugs. Do not use any products that contain nicotine or tobacco. These products include cigarettes, chewing tobacco, and vaping devices, such as e-cigarettes. If you need help quitting, ask your health care provider. If you drink alcohol: Limit how much you have to: 0-1 drink a day for women who are not pregnant. 0-2 drinks a day for men. Know how much alcohol is in a drink. In the [place] one drink equals one 12 oz bottle of beer (355 mL), one 5 oz glass of wine (148 mL), or one 1 oz glass of hard liquor (44 mL). Do not use alcohol or drugs to relax. Eat a balanced diet that includes fresh fruits and vegetables, whole grains, lean meats, fish, eggs, beans, and low-fat dairy. Avoid processed foods and foods high in added fat, sugar, and salt. Exercise at least 30 minutes on 5 or more days each week. Get 7-8 hours of sleep each night. General instructions  Practice stress management techniques as told by your health care provider. Drink enough fluid to keep your urine pale yellow. Take over-the-counter and prescription medicines only as told by your health care provider. Keep all follow-up visits. This is important. Contact a health care provider if: Your symptoms get worse. You have new symptoms. You feel overwhelmed by your problems and can no longer manage them by yourself. Get help right away if: You have thoughts of hurting yourself or others. Get help right awayif you feel like you may hurt yourself or others, or have thoughts about taking your own life. Go to your nearest emergency room or: Call 911. Call the [facility] at 443-334-6458 or  988. This is open 24 hours a day. Text the Crisis Text Line at 279-313-9668. Summary Stress is a normal reaction to life events. It can cause problems if it happens too often or for too long. Practicing stress management techniques is the best way to treat stress. Counseling or talk therapy with a mental health provider may help if you are having trouble managing stress by yourself. This information is not intended to replace advice given to you by your health care provider. Make sure you discuss any questions you have with your health care provider. Document Revised: 01/31/2021 Document Reviewed: 01/31/2021 Elsevier Patient Education  [place].

## 2021-08-21 LAB — CBC WITH DIFFERENTIAL/PLATELET
Basophils Absolute: 0.1 10*3/uL (ref 0.0–0.2)
Basos: 1 %
EOS (ABSOLUTE): 0.2 10*3/uL (ref 0.0–0.4)
Eos: 2 %
Hematocrit: 38.8 % (ref 34.0–46.6)
Hemoglobin: 12.3 g/dL (ref 11.1–15.9)
Immature Grans (Abs): 0 10*3/uL (ref 0.0–0.1)
Immature Granulocytes: 1 %
Lymphocytes Absolute: 1.7 10*3/uL (ref 0.7–3.1)
Lymphs: 24 %
MCH: 29.8 pg (ref 26.6–33.0)
MCHC: 31.7 g/dL (ref 31.5–35.7)
MCV: 94 fL (ref 79–97)
Monocytes Absolute: 0.5 10*3/uL (ref 0.1–0.9)
Monocytes: 8 %
Neutrophils Absolute: 4.8 10*3/uL (ref 1.4–7.0)
Neutrophils: 64 %
Platelets: 345 10*3/uL (ref 150–450)
RBC: 4.13 x10E6/uL (ref 3.77–5.28)
RDW: 14.3 % (ref 11.7–15.4)
WBC: 7.2 10*3/uL (ref 3.4–10.8)

## 2021-08-21 LAB — CMP14+EGFR
ALT: 15 IU/L (ref 0–32)
AST: 21 IU/L (ref 0–40)
Albumin/Globulin Ratio: 1.3 (ref 1.2–2.2)
Albumin: 4.3 g/dL (ref 3.7–4.7)
Alkaline Phosphatase: 68 IU/L (ref 44–121)
BUN/Creatinine Ratio: 13 (ref 12–28)
BUN: 13 mg/dL (ref 8–27)
Bilirubin Total: <0.2 mg/dL (ref 0.0–1.2)
CO2: 18 mmol/L — ABNORMAL LOW (ref 20–29)
Calcium: 9.9 mg/dL (ref 8.7–10.3)
Chloride: 103 mmol/L (ref 96–106)
Creatinine, Ser: 1.02 mg/dL — ABNORMAL HIGH (ref 0.57–1.00)
Globulin, Total: 3.2 g/dL (ref 1.5–4.5)
Glucose: 93 mg/dL (ref 70–99)
Potassium: 4.5 mmol/L (ref 3.5–5.2)
Sodium: 140 mmol/L (ref 134–144)
Total Protein: 7.5 g/dL (ref 6.0–8.5)
eGFR: 56 mL/min/{1.73_m2} — ABNORMAL LOW (ref >59)

## 2021-08-21 LAB — LIPID PANEL
Chol/HDL Ratio: 3.6 ratio (ref 0.0–4.4)
Cholesterol, Total: 192 mg/dL (ref 100–199)
HDL: 54 mg/dL (ref >39)
LDL Chol Calc (NIH): 111 mg/dL — ABNORMAL HIGH (ref 0–99)
Triglycerides: 154 mg/dL — ABNORMAL HIGH (ref 0–149)
VLDL Cholesterol Cal: 27 mg/dL (ref 5–40)

## 2021-08-21 LAB — CELIAC DISEASE PANEL
Endomysial IgA: NEGATIVE
IgA/Immunoglobulin A, Serum: 161 mg/dL (ref 64–422)
Transglutaminase IgA: <2 U/mL (ref 0–3)

## 2021-08-21 LAB — THYROID PANEL WITH TSH
Free Thyroxine Index: 3.3 (ref 1.2–4.9)
T3 Uptake Ratio: 28 % (ref 24–39)
T4, Total: 11.9 ug/dL (ref 4.5–12.0)
TSH: 1.24 u[IU]/mL (ref 0.450–4.500)

## 2021-08-22 DIAGNOSIS — H43813 Vitreous degeneration, bilateral: Secondary | ICD-10-CM | POA: Diagnosis not present

## 2021-08-22 DIAGNOSIS — H5213 Myopia, bilateral: Secondary | ICD-10-CM | POA: Diagnosis not present

## 2021-08-22 DIAGNOSIS — H04123 Dry eye syndrome of bilateral lacrimal glands: Secondary | ICD-10-CM | POA: Diagnosis not present

## 2021-09-03 ENCOUNTER — Ambulatory Visit (INDEPENDENT_AMBULATORY_CARE_PROVIDER_SITE_OTHER): Payer: Medicare Other | Admitting: *Deleted

## 2021-09-03 DIAGNOSIS — M349 Systemic sclerosis, unspecified: Secondary | ICD-10-CM

## 2021-09-03 DIAGNOSIS — D500 Iron deficiency anemia secondary to blood loss (chronic): Secondary | ICD-10-CM | POA: Diagnosis not present

## 2021-09-03 DIAGNOSIS — E785 Hyperlipidemia, unspecified: Secondary | ICD-10-CM

## 2021-09-03 DIAGNOSIS — I10 Essential (primary) hypertension: Secondary | ICD-10-CM

## 2021-09-11 ENCOUNTER — Telehealth: Payer: Medicare Other | Admitting: Licensed Clinical Social Worker

## 2021-09-11 ENCOUNTER — Telehealth: Payer: Self-pay | Admitting: Licensed Clinical Social Worker

## 2021-09-11 NOTE — Telephone Encounter (Signed)
?  Chronic Care Management  ?  ? Clinical Social Work Note ?  ?09/11/21 ?Name: Haley Trevino       MRN: [id number]       DOB: [birth year] ?  ?Haley Trevino is a 81 y.o. year old female who is a primary care patient of [name], [place]. The CCM team was consulted to assist the patient with chronic disease management and/or care coordination needs related to: [name] .  ?  ?LCSW not able to speak via phone with client today. LCSW not able to leave phone message for client today ? ?Follow Up Date:  11/05/21 at 1:00 PM ? ?Norva Riffle.Joylyn Duggin MSW, LCSW ?Licensed Clinical Social Worker ?[facility] ?[phone] ?

## 2021-09-13 ENCOUNTER — Inpatient Hospital Stay (HOSPITAL_COMMUNITY): Payer: Medicare Other | Attending: Hematology

## 2021-09-13 DIAGNOSIS — Z8673 Personal history of transient ischemic attack (TIA), and cerebral infarction without residual deficits: Secondary | ICD-10-CM | POA: Insufficient documentation

## 2021-09-13 DIAGNOSIS — R42 Dizziness and giddiness: Secondary | ICD-10-CM | POA: Diagnosis not present

## 2021-09-13 DIAGNOSIS — M349 Systemic sclerosis, unspecified: Secondary | ICD-10-CM | POA: Diagnosis not present

## 2021-09-13 DIAGNOSIS — D500 Iron deficiency anemia secondary to blood loss (chronic): Secondary | ICD-10-CM

## 2021-09-13 DIAGNOSIS — D509 Iron deficiency anemia, unspecified: Secondary | ICD-10-CM | POA: Insufficient documentation

## 2021-09-13 DIAGNOSIS — R202 Paresthesia of skin: Secondary | ICD-10-CM

## 2021-09-13 DIAGNOSIS — M79601 Pain in right arm: Secondary | ICD-10-CM | POA: Diagnosis not present

## 2021-09-13 DIAGNOSIS — M79602 Pain in left arm: Secondary | ICD-10-CM | POA: Diagnosis not present

## 2021-09-13 DIAGNOSIS — D649 Anemia, unspecified: Secondary | ICD-10-CM

## 2021-09-13 LAB — CBC WITH DIFFERENTIAL/PLATELET
Abs Immature Granulocytes: 0.02 10*3/uL (ref 0.00–0.07)
Basophils Absolute: 0 10*3/uL (ref 0.0–0.1)
Basophils Relative: 1 %
Eosinophils Absolute: 0.1 10*3/uL (ref 0.0–0.5)
Eosinophils Relative: 2 %
HCT: 35.3 % — ABNORMAL LOW (ref 36.0–46.0)
Hemoglobin: 12.8 g/dL (ref 12.0–15.0)
Immature Granulocytes: 0 %
Lymphocytes Relative: 23 %
Lymphs Abs: 1.5 10*3/uL (ref 0.7–4.0)
MCH: 35.2 pg — ABNORMAL HIGH (ref 26.0–34.0)
MCHC: 36.3 g/dL — ABNORMAL HIGH (ref 30.0–36.0)
MCV: 97 fL (ref 80.0–100.0)
Monocytes Absolute: 0.3 10*3/uL (ref 0.1–1.0)
Monocytes Relative: 5 %
Neutro Abs: 4.5 10*3/uL (ref 1.7–7.7)
Neutrophils Relative %: 69 %
Platelets: 304 10*3/uL (ref 150–400)
RBC: 3.64 MIL/uL — ABNORMAL LOW (ref 3.87–5.11)
RDW: 15.6 % — ABNORMAL HIGH (ref 11.5–15.5)
WBC: 6.4 10*3/uL (ref 4.0–10.5)
nRBC: 0 % (ref 0.0–0.2)

## 2021-09-13 LAB — IRON AND TIBC
Iron: 74 ug/dL (ref 28–170)
Saturation Ratios: 21 % (ref 10.4–31.8)
TIBC: 347 ug/dL (ref 250–450)
UIBC: 273 ug/dL

## 2021-09-13 LAB — VITAMIN B12: Vitamin B-12: 398 pg/mL (ref 180–914)

## 2021-09-13 LAB — FOLATE: Folate: 8.7 ng/mL (ref >5.9)

## 2021-09-13 LAB — FERRITIN: Ferritin: 125 ng/mL (ref 11–307)

## 2021-09-14 LAB — HOMOCYSTEINE: Homocysteine: 14.6 umol/L (ref 0.0–19.2)

## 2021-09-16 NOTE — Chronic Care Management (AMB) (Signed)
Chronic Care Management   CCM RN Visit Note  09/03/2021 Name: Haley Trevino MRN: [id number] DOB: [birth year]  Subjective: Haley Trevino is a 81 y.o. year old female who is a primary care patient of [name], [place]. The care management team was consulted for assistance with disease management and care coordination needs.    Engaged with patient by telephone for follow up visit in response to provider referral for case management and/or care coordination services.   Consent to Services:  The patient was given information about Chronic Care Management services, agreed to services, and gave verbal consent prior to initiation of services.  Please see initial visit note for detailed documentation.   Patient agreed to services and verbal consent obtained.   Assessment: Review of patient past medical history, allergies, medications, health status, including review of consultants reports, laboratory and other test data, was performed as part of comprehensive evaluation and provision of chronic care management services.   SDOH (Social Determinants of Health) assessments and interventions performed:    CCM Care Plan  Allergies  Allergen Reactions   Dilaudid [Hydromorphone Hcl] Anaphylaxis   Dilaudid [Hydromorphone] Anaphylaxis   Acyclovir And Related     Unknown reaction   Crestor [Rosuvastatin Calcium] Other (See Comments)    Muscle pain   Gabapentin Swelling    Swelling in feet, and in legs   Aleve [Naproxen Sodium] Rash   Lyrica [Pregabalin] Other (See Comments)    Swelling in feet, and in legs    Outpatient Encounter Medications as of 09/03/2021  Medication Sig   acetaminophen (TYLENOL) 500 MG tablet Take 1,000 mg by mouth every 6 (six) hours as needed for moderate pain or headache.   CALCIUM PO Take by mouth.   Cholecalciferol (VITAMIN D) 50 MCG (2000 UT) tablet Take 2,000 Units by mouth daily.   clonazePAM (KLONOPIN) 0.5 MG tablet Take 1 tablet (0.5 mg total)  by mouth 2 (two) times daily as needed for anxiety.   ezetimibe (ZETIA) 10 MG tablet Take 1 tablet (10 mg total) by mouth daily.   FLUoxetine (PROZAC) 40 MG capsule Take 1 capsule (40 mg total) by mouth daily.   fluticasone (FLONASE) 50 MCG/ACT nasal spray PLACE 2 SPRAYS INTO BOTH NOSTRILS DAILY   NIFEdipine (PROCARDIA XL/NIFEDICAL XL) 60 MG 24 hr tablet Take 1 tablet (60 mg total) by mouth daily.   omeprazole (PRILOSEC) 20 MG capsule TAKE 1 TABLET DAILY   PROLIA 60 MG/ML SOSY injection Inject 60 mg into the skin every 6 (six) months.   sildenafil (REVATIO) 20 MG tablet Take 1 tablet (20 mg total) by mouth 2 (two) times daily.   SYNTHROID 75 MCG tablet TAKE (1) TABLET DAILY BE- FORE BREAKFAST.   No facility-administered encounter medications on file as of 09/03/2021.    Patient Active Problem List   Diagnosis Date Noted   History of adenomatous polyp of colon 09/21/2020   Weight loss 09/21/2020   Irritable bowel syndrome 09/21/2020   Breast implant rupture 09/04/2020   Iron deficiency anemia due to chronic blood loss 08/28/2020   Pain in right shoulder 04/11/2020   Murmur 11/18/2017   Impingement syndrome of left shoulder region 12/08/2014   Osteoporosis 09/20/2014   Hip fracture requiring operative repair ([place]) 02/22/2014   Hypothyroidism 11/29/2013   Depression 11/29/2013   GERD (gastroesophageal reflux disease) 11/29/2013   Hypertension 11/29/2013   Hyperlipidemia with target LDL less than 100 11/29/2013   Thyroid cyst 04/26/2013   MALIGNANT MELANOMA, SKIN 08/07/2010  [name] 08/06/2010   SCLERODERMA 08/06/2010   Osteoarthritis 08/06/2010    Conditions to be addressed/monitored:OA, Depression,GERD, HTN, HLD, [name],Hypothyroidism, Osteoporosis, scleroderma, anemia  Care Plan : [facility] Care Plan     Problem: Chronic Disease Management Needs   Priority: High  Onset Date: 09/03/2021     Long-Range Goal: Patient Will Work With RN Care Manager Regarding  Care Management and [place] with OA, Depression,Anemia, GERD, HTN, , HLD, [name],Hypothyroidism, Osteoporosis, scleroderma   Start Date: 09/03/2021  Expected End Date: 09/03/2022  This Visit's Progress: On track  Priority: High  Note:   Current Barriers:  Chronic Disease Management support and education needs related to OA, Depression,GERD, HTN, HLD, [name],Hypothyroidism, Osteoporosis, scleroderma, anemia  RNCM Clinical Goal(s):  Patient will continue to work with RN Care Manager and/or Social Worker to address care management and care coordination needs related to OA, Depression,GERD, HTN, HLD, [name],Hypothyroidism, Osteoporosis, scleroderma, anemia as evidenced by adherence to CM Team Scheduled appointments     through collaboration with LCSW, provider, and care team.   Interventions: 1:1 collaboration with primary care provider regarding development and update of comprehensive plan of care as evidenced by provider attestation and co-signature Inter-disciplinary care team collaboration (see longitudinal plan of care) Evaluation of current treatment plan related to  self management and patient's adherence to plan as established by provider   Anemia/Bleeding:  (Status: New goal.) Long Term Goal  Assessment of understanding of anemia/bleeding disorder diagnosis Basic overview and discussion of anemia/bleeding disorder or acute disease state Review of most recent labs:  CBC Latest Ref Rng & Units 08/16/2021 07/02/2021  WBC 4.0 - 10.5 K/uL 7.2 5.4  Hemoglobin 12.0 - 15.0 g/dL 12.3 11.3(L)  Hematocrit 36.0 - 46.0 % 38.8 34.9(L)  Platelets 150 - 400 K/uL 345 283    Medications reviewed Discussed iron infusions - Counseled on avoidance of NSAIDs due to increased bleeding risk; - Counseled on importance of regular laboratory monitoring as directed by provider; - Provided education about signs and symptoms of active bleeding such as stomach  discomfort, coughing up blood or blood tinged secretions, bleeding from the gums/teeth, nosebleeds, increased bruising, blood in the urine/stool and/or if a traumatic injury occurs, regardless of severity of injury;  - Advised to call provider or 911 if active bleeding or signs and symptoms of active bleeding occur; - recommended promotion of rest and energy-conserving measures to manage fatigue, such as balancing activity with periods of rest - encouraged strategies to prevent falls related to fatigue, weakness and dizziness; encouraged sitting before standing and using an assistive device - encouraged optimal oral intake to support fluid balance and nutrition - encouraged dietary changes to increase dietary intake of iron, Vitamin B70 and folic acid as advised/prescribed - Assessed social determinant of health barriers;  Discussed potential causes for anemia  Scleroderma:  (Status: New goal.) Long Term Goal  Discussed plans with patient for ongoing care management follow up and provided patient with direct contact information for care management team Reviewed and discussed medications Discussed prior treatments. Has not seen a rheumatologist in years. Discussed current symptoms and patient questions if some of her issues, including anemia, are related to scleroderma Encouraged to reach out to PCP with any new or worsening symptoms  Hyperlipidemia:  (Status: New goal.) Long Term Goal  Lab Results  Component Value Date   CHOL 192 08/16/2021   HDL 54 08/16/2021   LDLCALC 111 (H) 08/16/2021   TRIG 154 (H) 08/16/2021  CHOLHDL 3.6 08/16/2021    Medication review performed; medication list updated in electronic medical record.  Provider established cholesterol goals reviewed; Counseled on importance of regular laboratory monitoring as prescribed; Reviewed role and benefits of statin for ASCVD risk reduction; Reviewed importance of limiting foods high in cholesterol; Reviewed exercise goals  and target of 150 minutes per week; Assessed social determinant of health barriers;   Hypertension: (Status: New goal.) Long Term Goal  Last practice recorded BP readings:  BP Readings from Last 3 Encounters:  08/16/21 (!) 123/56  08/06/21 (!) 112/57  07/29/21 (!) 137/53  Most recent eGFR/CrCl:  Lab Results  Component Value Date   EGFR 56 (L) 08/16/2021    No components found for: CRCL  Evaluation of current treatment plan related to hypertension self management and patient's adherence to plan as established by provider;   Reviewed medications with patient and discussed importance of compliance;  Counseled on the importance of exercise goals with target of 150 minutes per week Discussed plans with patient for ongoing care management follow up and provided patient with direct contact information for care management team; Advised patient, providing education and rationale, to monitor blood pressure daily and record, calling PCP for findings outside established parameters;  Discussed complications of poorly controlled blood pressure such as heart disease, stroke, circulatory complications, vision complications, kidney impairment, sexual dysfunction;  Assessed social determinant of health barriers;   Patient Goals/Self-Care Activities: Take medications as prescribed   Attend all scheduled provider appointments Perform all self care activities independently  Perform IADL's (shopping, preparing meals, housekeeping, managing finances) independently Call provider office for new concerns or questions  check blood pressure 3 times per week write blood pressure results in a log or diary take blood pressure log to all doctor appointments call doctor for signs and symptoms of high blood pressure eat more whole grains, fruits and vegetables, lean meats and healthy fats Call RN Care Manager as needed [phone]   Plan:Telephone follow up appointment with care management team member scheduled  for:  09/23/21 with RNCM The patient has been provided with contact information for the care management team and has been advised to call with any health related questions or concerns.   Chong Sicilian, BSN, RN-BC Embedded Chronic Care Manager [facility] / [place] Management Direct Dial: [phone]

## 2021-09-16 NOTE — Patient Instructions (Signed)
Visit Information   Patient Goals/Self-Care Activities: Take medications as prescribed   Attend all scheduled provider appointments Perform all self care activities independently  Perform IADL's (shopping, preparing meals, housekeeping, managing finances) independently Call provider office for new concerns or questions  check blood pressure 3 times per week write blood pressure results in a log or diary take blood pressure log to all doctor appointments call doctor for signs and symptoms of high blood pressure eat more whole grains, fruits and vegetables, lean meats and healthy fats Call RN Care Manager as needed [phone]  Patient verbalizes understanding of instructions and care plan provided today and agrees to view in [place]. Active MyChart status confirmed with patient.    Plan:Telephone follow up appointment with care management team member scheduled for:  09/23/21 with RNCM The patient has been provided with contact information for the care management team and has been advised to call with any health related questions or concerns.   Chong Sicilian, BSN, RN-BC Embedded Chronic Care Manager [facility] / [place] Management Direct Dial: [phone]

## 2021-09-17 LAB — METHYLMALONIC ACID, SERUM: Methylmalonic Acid, Quantitative: 194 nmol/L (ref 0–378)

## 2021-09-19 DIAGNOSIS — Z961 Presence of intraocular lens: Secondary | ICD-10-CM | POA: Diagnosis not present

## 2021-09-19 DIAGNOSIS — H04123 Dry eye syndrome of bilateral lacrimal glands: Secondary | ICD-10-CM | POA: Diagnosis not present

## 2021-09-19 DIAGNOSIS — H353131 Nonexudative age-related macular degeneration, bilateral, early dry stage: Secondary | ICD-10-CM | POA: Diagnosis not present

## 2021-09-19 DIAGNOSIS — H26493 Other secondary cataract, bilateral: Secondary | ICD-10-CM | POA: Diagnosis not present

## 2021-09-19 NOTE — Progress Notes (Signed)
? ?Virtual Visit via Telephone Note ?[place] ? ?I connected with [name]  on 09/20/21 at  12:03 PM by telephone and verified that I am speaking with the correct person using two identifiers. ? ?Location: ?Patient: Home ?Provider: [place] ?  ?I discussed the limitations, risks, security and privacy concerns of performing an evaluation and management service by telephone and the availability of in person appointments. I also discussed with the patient that there may be a patient responsible charge related to this service. The patient expressed understanding and agreed to proceed. ? ? ?REASON FOR VISIT:  ?Follow-up for iron deficiency anemia ?  ?CURRENT THERAPY: Intermittent IV iron infusions (last Venofer 04/17/2021) and PRBC transfusions (last blood transfusion on 12/14/2020) ?  ?INTERVAL HISTORY:  ?Haley Trevino 81y.o. female returns for routine follow-up of iron deficiency anemia secondary to chronic GI blood loss.  She was last seen by Tarri Abernethy PA-C on 07/11/2021.  Most recent IV iron was on 08/06/2021. ? ?She has not noticed any recent signs of blood loss such as bright red blood per rectum, melena, or epistaxis. ?She continues to have severe fatigue, reports that she is very easily worn out and has episodes of feeling lightheaded and "shaky all over."  She attributes some of the symptoms to stress related to her husband's health.  She has some chronic dyspnea on exertion.  She denies any chest pain, lightheadedness, syncope, or pica.  She has little to no energy and 50% appetite.  She reports that she is maintaining a stable weight. ? ?She continues to suffer from chronic chest wall pain secondary to complications and scarring from removal of breast implants, as well as her scleroderma.  She has multiple other complaints unrelated to today's visit including vertigo, hearing loss, worsening vision, and bilateral arm pain.  She feels stressed as she is trying to  keep up with her own health issues as well as her husband's history of stroke and lung cancer several years ago. ? ?  ?OBSERVATIONS/OBJECTIVE: ?Review of Systems  ?Constitutional:  Positive for malaise/fatigue. Negative for chills, diaphoresis, fever and weight loss.  ?Respiratory:  Positive for shortness of breath. Negative for cough.   ?Cardiovascular:  Negative for chest pain and palpitations.  ?Gastrointestinal:  Negative for abdominal pain, blood in stool, melena, nausea and vomiting.  ?Genitourinary:  Positive for frequency.  ?Neurological:  Positive for dizziness and tingling. Negative for headaches.  ?Psychiatric/Behavioral:  Positive for depression. The patient has insomnia.    ? ?PHYSICAL EXAM (per limitations of virtual telephone visit): The patient is alert and oriented x 3, exhibiting adequate mentation, good mood, and ability to speak in full sentences and execute sound judgement. ? ? ?ASSESSMENT & PLAN: ?1.  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 Watt Climes via [facility]) ?- She reports that her dark stool/melena resolved  in August 2022    ?- She is symptomatic with significant fatigue, despite her normal iron and hemoglobin ?- Most recent labs (09/13/2021): Hgb 12.8/MCV 97.0, ferritin 125, iron  saturation 21%.  Normal B12/methylmalonic acid and folate/homocystine ?- Differential diagnosis favors iron deficiency anemia secondary to chronic occult GI blood loss; may also be an aspect of malabsorption in the setting of scleroderma, low meat intake, and use of PPI ?- PLAN: No indication for IV iron at this time ?- RTC in 3 months with repeat labs, followed by phone visit. ?- Continue follow-up with gastroenterology for ongoing management of GI bleeding. ? ? ?FOLLOW UP INSTRUCTIONS: ?Labs in 3 months ?Phone visit after labs ? ?  ?I discussed the assessment and treatment plan with the patient. The patient was provided an opportunity to ask questions and all were answered. The patient agreed with the plan and demonstrated an understanding of the instructions. ?  ?The patient was advised to call back or seek an in-person evaluation if the symptoms worsen or if the condition fails to improve as anticipated. ? ?I provided 18 minutes of non-face-to-face time during this encounter. ? ? ?Harriett Rush, PA-C ?09/20/2021 1:24 PM ?

## 2021-09-20 ENCOUNTER — Other Ambulatory Visit: Payer: Self-pay

## 2021-09-20 ENCOUNTER — Inpatient Hospital Stay (HOSPITAL_BASED_OUTPATIENT_CLINIC_OR_DEPARTMENT_OTHER): Payer: Medicare Other | Admitting: Physician Assistant

## 2021-09-20 DIAGNOSIS — D500 Iron deficiency anemia secondary to blood loss (chronic): Secondary | ICD-10-CM

## 2021-09-23 ENCOUNTER — Ambulatory Visit (INDEPENDENT_AMBULATORY_CARE_PROVIDER_SITE_OTHER): Payer: Medicare Other | Admitting: *Deleted

## 2021-09-23 DIAGNOSIS — I10 Essential (primary) hypertension: Secondary | ICD-10-CM

## 2021-09-23 DIAGNOSIS — F3342 Major depressive disorder, recurrent, in full remission: Secondary | ICD-10-CM

## 2021-09-23 NOTE — Patient Instructions (Signed)
Visit Information ? ? ?Patient Goals/Self-Care Activities: ?Take medications as prescribed   ?Attend all scheduled provider appointments ?Perform all self care activities independently  ?Perform IADL's (shopping, preparing meals, housekeeping, managing finances) independently ?Call provider office for new concerns or questions  ?check blood pressure 3 times per week ?write blood pressure results in a log or diary ?take blood pressure log to all doctor appointments ?call doctor for signs and symptoms of high blood pressure ?eat more whole grains, fruits and vegetables, lean meats and healthy fats ?Call RN Care Manager as needed [phone] ?Talk with friends/family daily ?Reach out to LCSW with any psychosocial needs ? ?Patient verbalizes understanding of instructions and care plan provided today and agrees to view in [place]. Active MyChart status confirmed with patient.   ? ?Plan:Telephone follow up appointment with care management team member scheduled for:  10/30/21 with RNCM ?The patient has been provided with contact information for the care management team and has been advised to call with any health related questions or concerns.  ? ?Chong Sicilian, BSN, RN-BC ?Embedded Chronic Care Manager ?[place] / [place] Management ?Direct Dial: [phone] ? ?

## 2021-09-23 NOTE — Chronic Care Management (AMB) (Signed)
?Chronic Care Management  ? ?CCM RN Visit Note ? ?09/23/2021 ?Name: Haley Trevino MRN: [id number] DOB: [birth year] ? ?Subjective: ?Haley Trevino is a 81 y.o. year old female who is a primary care patient of [name], [place]. The care management team was consulted for assistance with disease management and care coordination needs.   ? ?Engaged with patient by telephone for follow up visit in response to provider referral for case management and/or care coordination services.  ? ?Consent to Services:  ?The patient was given information about Chronic Care Management services, agreed to services, and gave verbal consent prior to initiation of services.  Please see initial visit note for detailed documentation.  ? ?Patient agreed to services and verbal consent obtained.  ? ?Assessment: Review of patient past medical history, allergies, medications, health status, including review of consultants reports, laboratory and other test data, was performed as part of comprehensive evaluation and provision of chronic care management services.  ? ?SDOH (Social Determinants of Health) assessments and interventions performed:   ? ?CCM Care Plan ? ?Allergies  ?Allergen Reactions  ? Dilaudid [Hydromorphone Hcl] Anaphylaxis  ? Dilaudid [Hydromorphone] Anaphylaxis  ? Acyclovir And Related   ?  Unknown reaction  ? Crestor [Rosuvastatin Calcium] Other (See Comments)  ?  Muscle pain  ? Gabapentin Swelling  ?  Swelling in feet, and in legs  ? Aleve [Naproxen Sodium] Rash  ? Lyrica [Pregabalin] Other (See Comments)  ?  Swelling in feet, and in legs  ? ? ?Outpatient Encounter Medications as of 09/23/2021  ?Medication Sig  ? Multiple Vitamins-Minerals (PRESERVISION/LUTEIN PO) Take by mouth.  ? acetaminophen (TYLENOL) 500 MG tablet Take 1,000 mg by mouth every 6 (six) hours as needed for moderate pain or headache.  ? CALCIUM PO Take by mouth.  ? Cholecalciferol (VITAMIN D) 50 MCG (2000 UT) tablet Take 2,000 Units by mouth daily.   ? clonazePAM (KLONOPIN) 0.5 MG tablet Take 1 tablet (0.5 mg total) by mouth 2 (two) times daily as needed for anxiety.  ? ezetimibe (ZETIA) 10 MG tablet Take 1 tablet (10 mg total) by mouth daily.  ? FLUoxetine (PROZAC) 40 MG capsule Take 1 capsule (40 mg total) by mouth daily.  ? fluticasone (FLONASE) 50 MCG/ACT nasal spray PLACE 2 SPRAYS INTO BOTH NOSTRILS DAILY  ? NIFEdipine (PROCARDIA XL/NIFEDICAL XL) 60 MG 24 hr tablet Take 1 tablet (60 mg total) by mouth daily.  ? omeprazole (PRILOSEC) 20 MG capsule TAKE 1 TABLET DAILY  ? PROLIA 60 MG/ML SOSY injection Inject 60 mg into the skin every 6 (six) months.  ? sildenafil (REVATIO) 20 MG tablet Take 1 tablet (20 mg total) by mouth 2 (two) times daily.  ? SYNTHROID 75 MCG tablet TAKE (1) TABLET DAILY BE- FORE BREAKFAST.  ? ?No facility-administered encounter medications on file as of 09/23/2021.  ? ? ?Patient Active Problem List  ? Diagnosis Date Noted  ? History of adenomatous polyp of colon 09/21/2020  ? Weight loss 09/21/2020  ? Irritable bowel syndrome 09/21/2020  ? Breast implant rupture 09/04/2020  ? Iron deficiency anemia due to chronic blood loss 08/28/2020  ? Pain in right shoulder 04/11/2020  ? Murmur 11/18/2017  ? Impingement syndrome of left shoulder region 12/08/2014  ? Osteoporosis 09/20/2014  ? Hip fracture requiring operative repair ([place]) 02/22/2014  ? Hypothyroidism 11/29/2013  ? Depression 11/29/2013  ? GERD (gastroesophageal reflux disease) 11/29/2013  ? Hypertension 11/29/2013  ? Hyperlipidemia with target LDL less than 100 11/29/2013  ? Thyroid cyst  04/26/2013  ? MALIGNANT MELANOMA, SKIN 08/07/2010  ? [name] 08/06/2010  ? SCLERODERMA 08/06/2010  ? Osteoarthritis 08/06/2010  ? ? ?Conditions to be addressed/monitored:HTN, Depression, and anemia ? ?Care Plan : [facility] Care Plan  ?Updates made by Ilean China, RN since 09/23/2021 12:00 AM  ?  ? ?Problem: Chronic Disease Management Needs   ?Priority: High  ?Onset Date: 09/03/2021  ?   ? ?Long-Range Goal: Patient Will Work With [place] with OA, Depression,Anemia, GERD, HTN, , HLD, [name],Hypothyroidism, Osteoporosis, scleroderma   ?Start Date: 09/03/2021  ?Expected End Date: 09/03/2022  ?This Visit's Progress: On track  ?Recent Progress: On track  ?Priority: High  ?Note:   ?Current Barriers:  ?Chronic Disease Management support and education needs related to OA, Depression,GERD, HTN, HLD, [name],Hypothyroidism, Osteoporosis, scleroderma, anemia ? ?RNCM Clinical Goal(s):  ?Patient will continue to work with RN Care Manager and/or Social Worker to address care management and care coordination needs related to OA, Depression,GERD, HTN, HLD, [name],Hypothyroidism, Osteoporosis, scleroderma, anemia as evidenced by adherence to CM Team Scheduled appointments     through collaboration with LCSW, provider, and care team.  ? ?Interventions: ?1:1 collaboration with primary care provider regarding development and update of comprehensive plan of care as evidenced by provider attestation and co-signature ?Inter-disciplinary care team collaboration (see longitudinal plan of care) ?Evaluation of current treatment plan related to  self management and patient's adherence to plan as established by provider ?Therapeutic listening utilized regarding health related anxiety and caregiver strain ?Encouraged to reach out to LCSW or other mental health professional as needed for assistance with stress management. ?Discussed family/social support ? ? ?Anemia/Bleeding:  (Status: Goal on Track (progressing): YES.) Long Term Goal  ?Assessment of understanding of anemia/bleeding disorder diagnosis ?Basic overview and discussion of anemia/bleeding disorder or acute disease state ?Review of most recent labs:  ?Lab Results  ?Component Value Date  ? WBC 6.4 09/13/2021  ? HGB 12.8 09/13/2021  ? HCT 35.3 (L) 09/13/2021  ? MCV 97.0  09/13/2021  ? PLT 304 09/13/2021  ?Medications reviewed ?Discussed iron infusions ?- Counseled on avoidance of NSAIDs due to increased bleeding risk; ?- Counseled on importance of regular laboratory monitoring as directed by provider; ?- Provided education about signs and symptoms of active bleeding such as stomach discomfort, coughing up blood or blood tinged secretions, bleeding from the gums/teeth, nosebleeds, increased bruising, blood in the urine/stool and/or if a traumatic injury occurs, regardless of severity of injury;  ?- Advised to call provider or 911 if active bleeding or signs and symptoms of active bleeding occur; ?- recommended promotion of rest and energy-conserving measures to manage fatigue, such as balancing activity with periods of rest ?- encouraged strategies to prevent falls related to fatigue, weakness and dizziness; encouraged sitting before standing and using an assistive device ?- encouraged optimal oral intake to support fluid balance and nutrition ?- encouraged dietary changes to increase dietary intake of iron, Vitamin O13 and folic acid as advised/prescribed ?- Assessed social determinant of health barriers;  ?Discussed potential causes for anemia ? ? ?Scleroderma:  (Status: Condition stable. Not addressed this visit.) Long Term Goal  ?Discussed plans with patient for ongoing care management follow up and provided patient with direct contact information for care management team ?Reviewed and discussed medications ?Discussed prior treatments. Has not seen a rheumatologist in years. ?Discussed current symptoms and patient questions if some of her issues, including anemia, are related to scleroderma ?Encouraged to  reach out to PCP with any new or worsening symptoms ? ? ?Hyperlipidemia:  (Status: Condition stable. Not addressed this visit.) Long Term Goal  ?Lab Results  ?Component Value Date  ? CHOL 192 08/16/2021  ? HDL 54 08/16/2021  ? LDLCALC 111 (H) 08/16/2021  ? TRIG 154 (H)  08/16/2021  ? CHOLHDL 3.6 08/16/2021  ?  ?Medication review performed; medication list updated in electronic medical record.  ?Provider established cholesterol goals reviewed; ?Counseled on importance of regular laboratory

## 2021-10-04 ENCOUNTER — Ambulatory Visit (INDEPENDENT_AMBULATORY_CARE_PROVIDER_SITE_OTHER): Payer: Medicare Other | Admitting: Nurse Practitioner

## 2021-10-04 ENCOUNTER — Telehealth: Payer: Self-pay

## 2021-10-04 ENCOUNTER — Encounter: Payer: Self-pay | Admitting: Nurse Practitioner

## 2021-10-04 VITALS — BP 138/65 | HR 79 | Temp 98.5°F | Resp 20 | Wt 105.0 lb

## 2021-10-04 DIAGNOSIS — L03011 Cellulitis of right finger: Secondary | ICD-10-CM

## 2021-10-04 DIAGNOSIS — I10 Essential (primary) hypertension: Secondary | ICD-10-CM | POA: Diagnosis not present

## 2021-10-04 DIAGNOSIS — F3342 Major depressive disorder, recurrent, in full remission: Secondary | ICD-10-CM

## 2021-10-04 DIAGNOSIS — L98491 Non-pressure chronic ulcer of skin of other sites limited to breakdown of skin: Secondary | ICD-10-CM | POA: Diagnosis not present

## 2021-10-04 MED ORDER — SULFAMETHOXAZOLE-TRIMETHOPRIM 800-160 MG PO TABS: 1.0000 | ORAL_TABLET | Freq: Two times a day (BID) | ORAL | 0 refills | Status: DC

## 2021-10-04 NOTE — Progress Notes (Signed)
? ?  Subjective:  ? ? Patient ID: Haley Trevino, female    DOB: 1941-06-11, 81 y.o.   MRN: [id number] ? ? ?Chief Complaint: Hands stiff and has ulcers and thumb red and swollen (Right thumb/) ? ? ?HPI ?Patient comes in c/o ulcers on her fingers. She has a long history of raynaud disease and frequently gets ulcers on her fingers. She is on sildenafil to increase circulation to fingers and that has helped.her fingers are rally stiff. She has been soaking her fingers in epsom salt and has not helped. ? ? ? ?Review of Systems  ?Constitutional:  Negative for diaphoresis.  ?Eyes:  Negative for pain.  ?Respiratory:  Negative for shortness of breath.   ?Cardiovascular:  Negative for chest pain, palpitations and leg swelling.  ?Gastrointestinal:  Negative for abdominal pain.  ?Endocrine: Negative for polydipsia.  ?Skin:  Negative for rash.  ?Neurological:  Negative for dizziness, weakness and headaches.  ?Hematological:  Does not bruise/bleed easily.  ?All other systems reviewed and are negative. ? ?   ?Objective:  ? Physical Exam ?Constitutional:   ?   Appearance: Normal appearance.  ?Cardiovascular:  ?   Rate and Rhythm: Normal rate and regular rhythm.  ?   Heart sounds: Normal heart sounds.  ?Pulmonary:  ?   Effort: Pulmonary effort is normal. Respiratory distress: ulcer finger.  ?   Breath sounds: Normal breath sounds.  ?Skin: ?   General: Skin is warm.  ?   Comments: Small ulcers on multiple fingers with erythema around right thumb nail bed,  ?Neurological:  ?   General: No focal deficit present.  ?   Mental Status: She is alert and oriented to person, place, and time.  ?Psychiatric:     ?   Mood and Affect: Mood normal.     ?   Behavior: Behavior normal.  ? ? ?BP 138/65   Pulse 79   Temp 98.5 ?F (36.9 ?C) (Temporal)   Resp 20   Wt 105 lb (47.6 kg)   SpO2 96%   BMI 19.20 kg/m?  ? ? ? ?   ?Assessment & Plan:  ? ?[name] in today with chief complaint of Hands stiff and has ulcers and thumb red and  swollen (Right thumb/) ? ? ?1. Skin ulcer of finger, limited to breakdown of skin ([place]) ?2. Paronychia of right thumb ?Epsom salt soaks BID ?Do not pick or scratch at areas ?RTO prn ?- sulfamethoxazole-trimethoprim (BACTRIM DS) 800-160 MG tablet; Take 1 tablet by mouth 2 (two) times daily.  Dispense: 20 tablet; Refill: 0 ? ? ? ?The above assessment and management plan was discussed with the patient. The patient verbalized understanding of and has agreed to the management plan. Patient is aware to call the clinic if symptoms persist or worsen. Patient is aware when to return to the clinic for a follow-up visit. Patient educated on when it is appropriate to go to the emergency department.  ? ?Mary-Margaret Hassell Done, FNP ? ? ?

## 2021-10-04 NOTE — Telephone Encounter (Signed)
Patient is ready to continue her Prolia injections. They have been on hold because she was having iron infusion. Please call to order and schedule ?

## 2021-10-04 NOTE — Patient Instructions (Signed)
Venous Ulcer ?A venous ulcer is a shallow sore on your lower leg that is caused by poor circulation in your veins. This condition used to be called stasis ulcer. ?Venous ulcer is the most common type of lower leg ulcer. You may have venous ulcers on one leg or on both legs. The area where this condition most commonly develops is around the ankles. A venous ulcer may last for a long time (chronic ulcer) or it may return repeatedly (recurrent ulcer). ?What are the causes? ?A venous ulcer may be caused by any condition that causes poor blood flow in your legs. Veins have valves that help return blood to the heart. If these valves do not work properly: ?Blood can flow backward and pool in the lower legs. ?Blood can then leak out of your veins, which can irritate your skin. ?Irritation can cause a break in the skin, which becomes a venous ulcer. ?What increases the risk? ?You are more likely to develop this condition if you: ?Are 34 years of age or older. ?Are female. ?Are overweight. ?Are not active. ?Have had a leg ulcer in the past. ?Have varicose veins. ?Have clots in your lower leg veins (deep vein thrombosis). ?Have inflammation of your leg veins (phlebitis). ?Have recently had a pregnancy. ?Use products that contain nicotine or tobacco. ?What are the signs or symptoms? ?The main symptom of this condition is an open sore near your ankle. Other symptoms may include: ?Swelling. ?Thickening of the skin. ?Fluid leaking from the ulcer. ?Bleeding. ?Itching. ?Pain and swelling that gets worse when you stand up and feels better when you raise your leg. ?Blotchy skin. ?Darkening of the skin. ?How is this diagnosed? ?Your health care provider may suspect a venous ulcer based on your medical history and your risk factors. He or she may: ?Do a physical exam. ?Do other tests, such as: ?Measuring blood pressure in your arms and legs. ?Using sound waves (ultrasound) to measure blood flow in your leg veins. ?How is this  treated? ?This condition may be treated by: ?Keeping your leg raised (elevated). ?Wearing a type of bandage or stocking to compress the veins of your leg (compression therapy). ?Taking medicines to improve blood flow. ?Taking antibiotic medicines to treat infection. ?Cleaning your ulcer and removing any dead tissue from the wound (debridement). ?Placing various types of medicated bandages (dressings) or medicated wraps on your ulcer. ?Surgery to close the wound using a piece of skin taken from another area of your body (graft). This is only done for wounds that are deep or hard to heal. ?You may need to try several different types of treatment to get your venous ulcer to heal. Healing may take a long time. ?Follow these instructions at home: ?Medicines ?Take or apply over-the-counter and prescription medicines only as told by your health care provider. ?If you were prescribed an antibiotic medicine, take it as told by your health care provider. Do not stop using the antibiotic even if you start to feel better. ?Ask your health care provider if you should take aspirin before long trips. ?Wound care ?Follow instructions from your health care provider about how to take care of your wound. Make sure you: ?Wash your hands with soap and water before and after you change your bandage (dressing). If soap and water are not available, use hand sanitizer. ?Change your dressing as told by your health care provider. ?If you had a skin graft, leave stitches (sutures) in place. These may need to stay in place for 2  weeks or longer. ?Ask when you should remove your dressing. If your dressing is dry and sticks to your leg when you try to remove it, moisten or wet the dressing with saline solution or water so that the dressing can be removed without harming your skin or wound tissue. ?When you are able to remove your dressing, check your wound every day for signs of infection. Have a caregiver do this for you if you are not able to  do it yourself. Check for: ?More redness, swelling, or pain. ?More fluid or blood. ?Warmth. ?Pus or a bad smell. ?Activity ?Avoid sitting for a long time without moving. Get up to take short walks every 1-2 hours. This is important to improve blood flow in your legs. Ask for help if you feel weak or unsteady. ?Ask your health care provider what level of activity is safe for you. ?Rest with your legs raised (elevated) during the day. If possible, elevate your legs above the level of your heart for 30 minutes, 3-4 times a day, or as told by your health care provider. ?Do not sit with your legs crossed. ?General instructions ? ?Wear elastic stockings, compression stockings, or support hose as told by your health care provider. ?Raise the foot of your bed as told by your health care provider. ?Do not use any products that contain nicotine or tobacco, such as cigarettes, e-cigarettes, and chewing tobacco. If you need help quitting, ask your health care provider. ?Keep all follow-up visits as told by your health care provider. This is important. ?Contact a health care provider if: ?Your ulcer is getting larger or is not healing. ?Your pain gets worse. ?Get help right away if you have: ?More redness, swelling, or pain around your ulcer. ?More fluid or blood coming from your ulcer. ?Warmth in the area around your ulcer. ?Pus or a bad smell coming from your ulcer. ?A fever. ?Summary ?A venous ulcer is a shallow sore on your lower leg that is caused by poor circulation in your veins. ?Follow instructions from your health care provider about how to take care of your wound. ?Check your wound every day for signs of infection. ?Take over-the-counter and prescription medicines only as told by your health care provider. ?Keep all follow-up visits as told by your health care provider. This is important. ?This information is not intended to replace advice given to you by your health care provider. Make sure you discuss any questions  you have with your health care provider. ?Document Revised: 02/18/2018 Document Reviewed: 02/18/2018 ?Elsevier Patient Education ? [place]. ? ?

## 2021-10-15 ENCOUNTER — Telehealth (HOSPITAL_COMMUNITY): Payer: Self-pay | Admitting: *Deleted

## 2021-10-15 ENCOUNTER — Telehealth: Payer: Self-pay | Admitting: Nurse Practitioner

## 2021-10-15 MED ORDER — DOXYCYCLINE HYCLATE 100 MG PO TABS: 100.0000 mg | ORAL_TABLET | Freq: Two times a day (BID) | ORAL | 0 refills | Status: DC

## 2021-10-15 NOTE — Telephone Encounter (Signed)
Patient aware and verbalized understanding. °

## 2021-10-15 NOTE — Telephone Encounter (Signed)
Pt says that she took sulfamethoxazole-trimethoprim (BACTRIM DS) 800-160 MG tablet although it made her sick. Pt says that her ankles and feet hurt. The infection in thumb is there. It got better but has not cleared. Can she take something else? Use [facility] ?

## 2021-10-15 NOTE — Telephone Encounter (Signed)
Received telephone call from patient stating that she has been nauseous and does not feel well in general.  Has been treated with Augmentin for a finger infection by PCP and states she has felt sick ever since.  Last dose of antibiotic was this past Sunday. Finger is still very red.  Said she feels she may have had difficulty with sulfa drugs in the past. Inquired about checking iron, as she assumes that this is the cause of her feeling bad.  Levels were checked last month and were normal.  I advised that she should contact her PCP and report her symptoms, as it is more likely due to infection and antibiotic treatment.  Verbalized understanding and will follow up with a telephone call to [place] if she still feels she needs levels checked after recovering from infection. ?

## 2021-10-15 NOTE — Telephone Encounter (Signed)
Agree with your plan.  Thanks, Tomi.

## 2021-10-15 NOTE — Telephone Encounter (Signed)
Meds ordered this encounter  Medications   doxycycline (VIBRA-TABS) 100 MG tablet    Sig: Take 1 tablet (100 mg total) by mouth 2 (two) times daily. 1 po bid    Dispense:  20 tablet    Refill:  0    Order Specific Question:   Supervising Provider    Answer:   Caryl Pina A [id number]

## 2021-10-16 MED ORDER — PROLIA 60 MG/ML ~~LOC~~ SOSY: 60.0000 mg | PREFILLED_SYRINGE | SUBCUTANEOUS | 0 refills | Status: DC

## 2021-10-16 NOTE — Telephone Encounter (Signed)
Med sent to [facility] today - she is aware that once she picks it up to put it in her fridge and call the office for an inj appt with the nurse ?

## 2021-10-29 ENCOUNTER — Ambulatory Visit (INDEPENDENT_AMBULATORY_CARE_PROVIDER_SITE_OTHER): Payer: Medicare Other | Admitting: Nurse Practitioner

## 2021-10-29 ENCOUNTER — Encounter: Payer: Self-pay | Admitting: Nurse Practitioner

## 2021-10-29 ENCOUNTER — Telehealth: Payer: Self-pay | Admitting: Nurse Practitioner

## 2021-10-29 ENCOUNTER — Encounter (HOSPITAL_COMMUNITY): Payer: Self-pay | Admitting: Hematology

## 2021-10-29 ENCOUNTER — Ambulatory Visit (INDEPENDENT_AMBULATORY_CARE_PROVIDER_SITE_OTHER): Payer: Medicare Other

## 2021-10-29 ENCOUNTER — Other Ambulatory Visit: Payer: Medicare Other

## 2021-10-29 DIAGNOSIS — R5383 Other fatigue: Secondary | ICD-10-CM

## 2021-10-29 DIAGNOSIS — M810 Age-related osteoporosis without current pathological fracture: Secondary | ICD-10-CM

## 2021-10-29 DIAGNOSIS — D500 Iron deficiency anemia secondary to blood loss (chronic): Secondary | ICD-10-CM

## 2021-10-29 LAB — HEMOGLOBIN, FINGERSTICK: Hemoglobin: 10.9 g/dL — ABNORMAL LOW (ref 11.1–15.9)

## 2021-10-29 MED ORDER — DENOSUMAB 60 MG/ML ~~LOC~~ SOSY
60.0000 mg | PREFILLED_SYRINGE | Freq: Once | SUBCUTANEOUS | Status: AC
  Administered 2021-10-29: 60 mg via SUBCUTANEOUS

## 2021-10-29 NOTE — Patient Instructions (Signed)
Anemia ? ?Anemia is a condition in which there is not enough red blood cells or hemoglobin in the blood. Hemoglobin is a substance in red blood cells that carries oxygen. ?When you do not have enough red blood cells or hemoglobin (are anemic), your body cannot get enough oxygen and your organs may not work properly. As a result, you may feel very tired or have other problems. ?What are the causes? ?Common causes of anemia include: ?Excessive bleeding. Anemia can be caused by excessive bleeding inside or outside the body, including bleeding from the intestines or from heavy menstrual periods in females. ?Poor nutrition. ?Long-lasting (chronic) kidney, thyroid, and liver disease. ?Bone marrow disorders, spleen problems, and blood disorders. ?Cancer and treatments for cancer. ?HIV (human immunodeficiency virus) and AIDS (acquired immunodeficiency syndrome). ?Infections, medicines, and autoimmune disorders that destroy red blood cells. ?What are the signs or symptoms? ?Symptoms of this condition include: ?Minor weakness. ?Dizziness. ?Headache, or difficulties concentrating and sleeping. ?Heartbeats that feel irregular or faster than normal (palpitations). ?Shortness of breath, especially with exercise. ?Pale skin, lips, and nails, or cold hands and feet. ?Indigestion and nausea. ?Symptoms may occur suddenly or develop slowly. If your anemia is mild, you may not have symptoms. ?How is this diagnosed? ?This condition is diagnosed based on blood tests, your medical history, and a physical exam. In some cases, a test may be needed in which cells are removed from the soft tissue inside of a bone and looked at under a microscope (bone marrow biopsy). Your health care provider may also check your stool (feces) for blood and may do additional testing to look for the cause of your bleeding. ?Other tests may include: ?Imaging tests, such as a CT scan or MRI. ?A procedure to see inside your esophagus and stomach (endoscopy). ?A  procedure to see inside your colon and rectum (colonoscopy). ?How is this treated? ?Treatment for this condition depends on the cause. If you continue to lose a lot of blood, you may need to be treated at a hospital. Treatment may include: ?Taking supplements of iron, vitamin Z30, or folic acid. ?Taking a hormone medicine (erythropoietin) that can help to stimulate red blood cell growth. ?Having a blood transfusion. This may be needed if you lose a lot of blood. ?Making changes to your diet. ?Having surgery to remove your spleen. ?Follow these instructions at home: ?Take over-the-counter and prescription medicines only as told by your health care provider. ?Take supplements only as told by your health care provider. ?Follow any diet instructions that you were given by your health care provider. ?Keep all follow-up visits as told by your health care provider. This is important. ?Contact a health care provider if: ?You develop new bleeding anywhere in the body. ?Get help right away if: ?You are very weak. ?You are short of breath. ?You have pain in your abdomen or chest. ?You are dizzy or feel faint. ?You have trouble concentrating. ?You have bloody stools, black stools, or tarry stools. ?You vomit repeatedly or you vomit up blood. ?These symptoms may represent a serious problem that is an emergency. Do not wait to see if the symptoms will go away. Get medical help right away. Call your local emergency services (911 in the [place] Do not drive yourself to the hospital. ?Summary ?Anemia is a condition in which you do not have enough red blood cells or enough of a substance in your red blood cells that carries oxygen (hemoglobin). ?Symptoms may occur suddenly or develop slowly. ?If your anemia  is mild, you may not have symptoms. ?This condition is diagnosed with blood tests, a medical history, and a physical exam. Other tests may be needed. ?Treatment for this condition depends on the cause of the anemia. ?This  information is not intended to replace advice given to you by your health care provider. Make sure you discuss any questions you have with your health care provider. ?Document Revised: 05/07/2021 Document Reviewed: 05/31/2019 ?Elsevier Patient Education ? [place]. ? ?

## 2021-10-29 NOTE — Progress Notes (Signed)
Prolia '60mg'$ /ml given in left upper arm without difficulty. ? ?During appt pt stated that her stools have turned dark again. She does not take iron and last iron infusion was in Jan 2023. Pt is not sure if she has ate anything that would turn them dark. Pt going to lab for Hgb fingerstick. Per MMM ok to add FOBT. Pt made aware. ?

## 2021-10-29 NOTE — Progress Notes (Signed)
? ?  Virtual Visit  Note ?Due to COVID-19 pandemic this visit was conducted virtually. This visit type was conducted due to national recommendations for restrictions regarding the COVID-19 Pandemic (e.g. social distancing, sheltering in place) in an effort to limit this patient's exposure and mitigate transmission in our community. All issues noted in this document were discussed and addressed.  A physical exam was not performed with this format. ? ?I connected with Haley Trevino on 10/29/21 at 1:41 by telephone and verified that I am speaking with the correct person using two identifiers. Haley Trevino is currently located at home and her husband is currently with her during visit. The provider, Mary-Margaret Hassell Done, FNP is located in their office at time of visit. ? ?I discussed the limitations, risks, security and privacy concerns of performing an evaluation and management service by telephone and the availability of in person appointments. I also discussed with the patient that there may be a patient responsible charge related to this service. The patient expressed understanding and agreed to proceed. ? ? ?History and Present Illness: ? ?Patient says she feels weak. Last time she felt like this her hgb was low. She has had 12 iron infusions and her last hgb was 12. She got her last infusion was in January. She says the last time she felt like this her hgb was low. She does not go back to hematology until June. Her blood pressure has been running low. Is coming in this afternoon for a prolia injection and wanted to get hgb checked then. ? ? ? ?Review of Systems  ?Constitutional:  Positive for malaise/fatigue.  ? ? ?Observations/Objective: ?Alert and oriented- answers all questions appropriately ?No distress ? ? ?Assessment and Plan: ?Haley Trevino in today with chief complaint of No chief complaint on file. ? ? ?1. Other fatigue ?- Hemoglobin, fingerstick ? ?2. Iron deficiency anemia due to chronic  blood loss ?- Hemoglobin, fingerstick ? ?Will look at hgb results when complete ? ? ?Follow Up Instructions: ?prn ? ?  ?I discussed the assessment and treatment plan with the patient. The patient was provided an opportunity to ask questions and all were answered. The patient agreed with the plan and demonstrated an understanding of the instructions. ?  ?The patient was advised to call back or seek an in-person evaluation if the symptoms worsen or if the condition fails to improve as anticipated. ? ?The above assessment and management plan was discussed with the patient. The patient verbalized understanding of and has agreed to the management plan. Patient is aware to call the clinic if symptoms persist or worsen. Patient is aware when to return to the clinic for a follow-up visit. Patient educated on when it is appropriate to go to the emergency department.  ? ?Time call ended:  1:53 ? ?I provided 12 minutes of  non face-to-face time during this encounter. ? ? ? ?Mary-Margaret Hassell Done, FNP ? ? ?

## 2021-10-30 ENCOUNTER — Telehealth: Payer: Medicare Other

## 2021-10-31 ENCOUNTER — Encounter (HOSPITAL_COMMUNITY): Payer: Self-pay | Admitting: *Deleted

## 2021-10-31 NOTE — Progress Notes (Signed)
Patient called today with c/o shaky and weakness. She said that she was recently at her providers office and they drew her hgb and was found to be 10.9.  She is concerned that she has dropped since her last visit here.  I explained to her that at the last visit she did not require iron infusion and at 10.9, we would not administer any blood products.  I explained to her that she needs to follow up with her GI provider to determine the cause of her bleeding.  She states that she is very intune to her body and knows when she drops low.  She verbalizes understanding of the need to reach out to GI.   ?

## 2021-11-04 DIAGNOSIS — Q2733 Arteriovenous malformation of digestive system vessel: Secondary | ICD-10-CM | POA: Diagnosis not present

## 2021-11-04 DIAGNOSIS — K921 Melena: Secondary | ICD-10-CM | POA: Diagnosis not present

## 2021-11-04 DIAGNOSIS — D509 Iron deficiency anemia, unspecified: Secondary | ICD-10-CM | POA: Diagnosis not present

## 2021-11-05 ENCOUNTER — Telehealth: Payer: Medicare Other

## 2021-11-08 ENCOUNTER — Other Ambulatory Visit: Payer: Self-pay | Admitting: Gastroenterology

## 2021-11-08 DIAGNOSIS — R109 Unspecified abdominal pain: Secondary | ICD-10-CM

## 2021-11-11 ENCOUNTER — Other Ambulatory Visit (HOSPITAL_COMMUNITY): Payer: Self-pay

## 2021-11-11 DIAGNOSIS — D500 Iron deficiency anemia secondary to blood loss (chronic): Secondary | ICD-10-CM

## 2021-11-14 ENCOUNTER — Inpatient Hospital Stay (HOSPITAL_COMMUNITY): Payer: Medicare Other | Attending: Hematology

## 2021-11-14 ENCOUNTER — Other Ambulatory Visit (HOSPITAL_COMMUNITY): Payer: Self-pay

## 2021-11-14 ENCOUNTER — Other Ambulatory Visit (HOSPITAL_COMMUNITY): Payer: Self-pay | Admitting: Physician Assistant

## 2021-11-14 DIAGNOSIS — D500 Iron deficiency anemia secondary to blood loss (chronic): Secondary | ICD-10-CM

## 2021-11-14 DIAGNOSIS — D509 Iron deficiency anemia, unspecified: Secondary | ICD-10-CM | POA: Insufficient documentation

## 2021-11-14 LAB — CBC WITH DIFFERENTIAL/PLATELET
Abs Immature Granulocytes: 0.01 10*3/uL (ref 0.00–0.07)
Basophils Absolute: 0 10*3/uL (ref 0.0–0.1)
Basophils Relative: 1 %
Eosinophils Absolute: 0.2 10*3/uL (ref 0.0–0.5)
Eosinophils Relative: 5 %
HCT: 30.3 % — ABNORMAL LOW (ref 36.0–46.0)
Hemoglobin: 9.8 g/dL — ABNORMAL LOW (ref 12.0–15.0)
Immature Granulocytes: 0 %
Lymphocytes Relative: 23 %
Lymphs Abs: 1 10*3/uL (ref 0.7–4.0)
MCH: 30 pg (ref 26.0–34.0)
MCHC: 32.3 g/dL (ref 30.0–36.0)
MCV: 92.7 fL (ref 80.0–100.0)
Monocytes Absolute: 0.4 10*3/uL (ref 0.1–1.0)
Monocytes Relative: 9 %
Neutro Abs: 2.8 10*3/uL (ref 1.7–7.7)
Neutrophils Relative %: 62 %
Platelets: 273 10*3/uL (ref 150–400)
RBC: 3.27 MIL/uL — ABNORMAL LOW (ref 3.87–5.11)
RDW: 16.5 % — ABNORMAL HIGH (ref 11.5–15.5)
WBC: 4.4 10*3/uL (ref 4.0–10.5)
nRBC: 0 % (ref 0.0–0.2)

## 2021-11-14 LAB — SAMPLE TO BLOOD BANK

## 2021-11-14 LAB — FERRITIN: Ferritin: 35 ng/mL (ref 11–307)

## 2021-11-14 LAB — IRON AND TIBC
Iron: 41 ug/dL (ref 28–170)
Saturation Ratios: 13 % (ref 10.4–31.8)
TIBC: 321 ug/dL (ref 250–450)
UIBC: 280 ug/dL

## 2021-11-14 NOTE — Progress Notes (Signed)
Iron formulation changed to Venofer 300 mg x 3 (instead of Feraheme x2).

## 2021-11-14 NOTE — Progress Notes (Signed)
Left message for patient to call back  

## 2021-11-14 NOTE — Progress Notes (Signed)
Venofer 300 mg x 3 based on most recent iron panel (Hgb 9.8, ferritin 35, iron saturation 13%), which was obtained due to patient concerns about feeling fatigued and shaky. ?

## 2021-11-14 NOTE — Progress Notes (Signed)
Patient is aware and agreeable with plan.

## 2021-11-18 ENCOUNTER — Ambulatory Visit (HOSPITAL_COMMUNITY): Payer: Medicare Other

## 2021-11-21 ENCOUNTER — Inpatient Hospital Stay (HOSPITAL_COMMUNITY): Payer: Medicare Other

## 2021-11-21 VITALS — BP 120/46 | HR 70 | Temp 98.3°F | Resp 18

## 2021-11-21 DIAGNOSIS — D509 Iron deficiency anemia, unspecified: Secondary | ICD-10-CM | POA: Diagnosis not present

## 2021-11-21 DIAGNOSIS — D500 Iron deficiency anemia secondary to blood loss (chronic): Secondary | ICD-10-CM

## 2021-11-21 MED ORDER — SODIUM CHLORIDE 0.9 % IV SOLN
300.0000 mg | Freq: Once | INTRAVENOUS | Status: AC
  Administered 2021-11-21: 300 mg via INTRAVENOUS
  Filled 2021-11-21: qty 300

## 2021-11-21 MED ORDER — LORATADINE 10 MG PO TABS
10.0000 mg | ORAL_TABLET | Freq: Once | ORAL | Status: AC
  Administered 2021-11-21: 10 mg via ORAL
  Filled 2021-11-21: qty 1

## 2021-11-21 MED ORDER — ACETAMINOPHEN 325 MG PO TABS
650.0000 mg | ORAL_TABLET | Freq: Once | ORAL | Status: AC
  Administered 2021-11-21: 650 mg via ORAL
  Filled 2021-11-21: qty 2

## 2021-11-21 MED ORDER — SODIUM CHLORIDE 0.9 % IV SOLN: Freq: Once | INTRAVENOUS | Status: AC

## 2021-11-21 NOTE — Progress Notes (Signed)
Patient presents today for iron infusion.  Patient is in satisfactory condition with only complaints of increased fatigue. Vital signs are stable.  We will proceed with treatment per MD orders.   Patient tolerated treatment well with no complaints voiced.  Patient left ambulatory in stable condition.  Vital signs stable at discharge.  Follow up as scheduled.

## 2021-11-21 NOTE — Patient Instructions (Addendum)
[facility]  Discharge Instructions: Thank you for choosing [facility] to provide your oncology and hematology care.  If you have a lab appointment with the [facility], please come in thru the Main Entrance and check in at the main information desk.  We strive to give you quality time with your provider. You may need to reschedule your appointment if you arrive late (15 or more minutes).  Arriving late affects you and other patients whose appointments are after yours.  Also, if you miss three or more appointments without notifying the office, you may be dismissed from the clinic at the provider's discretion.      For prescription refill requests, have your pharmacy contact our office and allow 72 hours for refills to be completed.    Iron Sucrose Injection What is this medication? IRON SUCROSE (EYE ern SOO krose) treats low levels of iron (iron deficiency anemia) in people with kidney disease. Iron is a mineral that plays an important role in making red blood cells, which carry oxygen from your lungs to the rest of your body. This medicine may be used for other purposes; ask your health care provider or pharmacist if you have questions. COMMON BRAND NAME(S): Venofer What should I tell my care team before I take this medication? They need to know if you have any of these conditions: Anemia not caused by low iron levels Heart disease High levels of iron in the blood Kidney disease Liver disease An unusual or allergic reaction to iron, other medications, foods, dyes, or preservatives Pregnant or trying to get pregnant Breast-feeding How should I use this medication? This medication is for infusion into a vein. It is given in a hospital or clinic setting. Talk to your care team about the use of this medication in children. While this medication may be prescribed for children as young as 2 years for selected conditions, precautions do apply. Overdosage: If you  think you have taken too much of this medicine contact a poison control center or emergency room at once. NOTE: This medicine is only for you. Do not share this medicine with others. What if I miss a dose? It is important not to miss your dose. Call your care team if you are unable to keep an appointment. What may interact with this medication? Do not take this medication with any of the following: Deferoxamine Dimercaprol Other iron products This medication may also interact with the following: Chloramphenicol Deferasirox This list may not describe all possible interactions. Give your health care provider a list of all the medicines, herbs, non-prescription drugs, or dietary supplements you use. Also tell them if you smoke, drink alcohol, or use illegal drugs. Some items may interact with your medicine. What should I watch for while using this medication? Visit your care team regularly. Tell your care team if your symptoms do not start to get better or if they get worse. You may need blood work done while you are taking this medication. You may need to follow a special diet. Talk to your care team. Foods that contain iron include: whole grains/cereals, dried fruits, beans, or peas, leafy green vegetables, and organ meats (liver, kidney). What side effects may I notice from receiving this medication? Side effects that you should report to your care team as soon as possible: Allergic reactions--skin rash, itching, hives, swelling of the face, lips, tongue, or throat Low blood pressure--dizziness, feeling faint or lightheaded, blurry vision Shortness of breath Side effects that  usually do not require medical attention (report to your care team if they continue or are bothersome): Flushing Headache Joint pain Muscle pain Nausea Pain, redness, or irritation at injection site This list may not describe all possible side effects. Call your doctor for medical advice about side effects. You may  report side effects to FDA at 1-800-FDA-1088. Where should I keep my medication? This medication is given in a hospital or clinic and will not be stored at home. NOTE: This sheet is a summary. It may not cover all possible information. If you have questions about this medicine, talk to your doctor, pharmacist, or health care provider.  2023 Elsevier/Gold Standard (2020-11-16 00:00:00)    To help prevent nausea and vomiting after your treatment, we encourage you to take your nausea medication as directed.  BELOW ARE SYMPTOMS THAT SHOULD BE REPORTED IMMEDIATELY: *FEVER GREATER THAN 100.4 F (38 C) OR HIGHER *CHILLS OR SWEATING *NAUSEA AND VOMITING THAT IS NOT CONTROLLED WITH YOUR NAUSEA MEDICATION *UNUSUAL SHORTNESS OF BREATH *UNUSUAL BRUISING OR BLEEDING *URINARY PROBLEMS (pain or burning when urinating, or frequent urination) *BOWEL PROBLEMS (unusual diarrhea, constipation, pain near the anus) TENDERNESS IN MOUTH AND THROAT WITH OR WITHOUT PRESENCE OF ULCERS (sore throat, sores in mouth, or a toothache) UNUSUAL RASH, SWELLING OR PAIN  UNUSUAL VAGINAL DISCHARGE OR ITCHING   Items with * indicate a potential emergency and should be followed up as soon as possible or go to the Emergency Department if any problems should occur.  Should you have questions after your visit or need to cancel or reschedule your appointment, please contact [facility] [phone]  and follow the prompts.  Office hours are 8:00 a.m. to 4:30 p.m. Monday - Friday. Please note that voicemails left after 4:00 p.m. may not be returned until the following business day.  We are closed weekends and major holidays. You have access to a nurse at all times for urgent questions. Please call the main number to the clinic [phone] and follow the prompts.  For any non-urgent questions, you may also contact your provider using MyChart. We now offer e-Visits for anyone 39 and older to request care online for  non-urgent symptoms. For details visit [email or web address].   Also download the MyChart app! Go to the app store, search "MyChart", open the app, select , and log in with your MyChart username and password.  Due to Covid, a mask is required upon entering the hospital/clinic. If you do not have a mask, one will be given to you upon arrival. For doctor visits, patients may have 1 support person aged 81 or older with them. For treatment visits, patients cannot have anyone with them due to current Covid guidelines and our immunocompromised population.

## 2021-11-25 ENCOUNTER — Other Ambulatory Visit: Payer: Self-pay | Admitting: Nurse Practitioner

## 2021-11-25 ENCOUNTER — Ambulatory Visit (HOSPITAL_COMMUNITY): Payer: Medicare Other

## 2021-11-27 ENCOUNTER — Ambulatory Visit (INDEPENDENT_AMBULATORY_CARE_PROVIDER_SITE_OTHER): Payer: Medicare Other | Admitting: Nurse Practitioner

## 2021-11-27 ENCOUNTER — Telehealth: Payer: Self-pay | Admitting: Nurse Practitioner

## 2021-11-27 ENCOUNTER — Encounter: Payer: Self-pay | Admitting: Nurse Practitioner

## 2021-11-27 VITALS — BP 130/53 | HR 73 | Ht 62.0 in | Wt 102.6 lb

## 2021-11-27 DIAGNOSIS — R300 Dysuria: Secondary | ICD-10-CM | POA: Diagnosis not present

## 2021-11-27 DIAGNOSIS — N898 Other specified noninflammatory disorders of vagina: Secondary | ICD-10-CM

## 2021-11-27 LAB — WET PREP FOR TRICH, YEAST, CLUE
Clue Cell Exam: NEGATIVE
Trichomonas Exam: NEGATIVE
Yeast Exam: NEGATIVE

## 2021-11-27 LAB — URINALYSIS, COMPLETE
Bilirubin, UA: NEGATIVE
Glucose, UA: NEGATIVE
Ketones, UA: NEGATIVE
Leukocytes,UA: NEGATIVE
Nitrite, UA: NEGATIVE
Protein,UA: NEGATIVE
RBC, UA: NEGATIVE
Specific Gravity, UA: <1.005 — ABNORMAL LOW (ref 1.005–1.030)
Urobilinogen, Ur: 0.2 mg/dL (ref 0.2–1.0)
pH, UA: 5.5 (ref 5.0–7.5)

## 2021-11-27 LAB — MICROSCOPIC EXAMINATION
Bacteria, UA: NONE SEEN
RBC, Urine: NONE SEEN /hpf (ref 0–2)
Renal Epithel, UA: NONE SEEN /hpf

## 2021-11-27 MED ORDER — FLUCONAZOLE 150 MG PO TABS: 150.0000 mg | ORAL_TABLET | Freq: Once | ORAL | 0 refills | Status: AC

## 2021-11-27 NOTE — Telephone Encounter (Signed)
Patient was on bactrim and then doxy. Covering PCP- please advise

## 2021-11-27 NOTE — Telephone Encounter (Signed)
What symptoms is she having?

## 2021-11-27 NOTE — Telephone Encounter (Signed)
Appointment scheduled.

## 2021-11-27 NOTE — Progress Notes (Signed)
Acute Office Visit  Subjective:     Patient ID: Haley Trevino, female    DOB: May 14, 1941, 81 y.o.   MRN: [id number]  Chief Complaint  Patient presents with   Urinary Tract Infection   Urinary Frequency    Some burning -    Vaginal Itching    3 days ago    Vaginal Discharge    Little in liner , yellowish color     Urinary Frequency  This is a new problem. The current episode started yesterday. The problem occurs intermittently. The problem has been gradually improving. The patient is experiencing no pain. There has been no fever. She is Not sexually active. There is No history of pyelonephritis. Associated symptoms include frequency. Pertinent negatives include no chills, discharge or flank pain. She has tried nothing for the symptoms.  Vaginal Discharge The patient's primary symptoms include vaginal discharge. The patient's pertinent negatives include no pelvic pain. This is a new problem. The current episode started yesterday. The problem occurs intermittently. The patient is experiencing no pain. Associated symptoms include frequency. Pertinent negatives include no abdominal pain, chills or flank pain. The vaginal discharge was thin and yellow. There has been no bleeding. Nothing aggravates the symptoms. She has tried antibiotics for the symptoms. She is not sexually active. She is postmenopausal.   Review of Systems  Constitutional:  Negative for chills.  HENT: Negative.    Respiratory: Negative.    Cardiovascular: Negative.   Gastrointestinal:  Negative for abdominal pain.  Genitourinary:  Positive for frequency and vaginal discharge. Negative for flank pain and pelvic pain.  Skin: Negative.        Objective:    BP (!) 130/53   Pulse 73   Ht '5\' 2"'$  (1.575 m)   Wt 102 lb 9.6 oz (46.5 kg)   SpO2 95%   BMI 18.77 kg/m  BP Readings from Last 3 Encounters:  11/27/21 (!) 130/53  11/21/21 (!) 120/46  10/04/21 138/65   Wt Readings from Last 3 Encounters:  11/27/21  102 lb 9.6 oz (46.5 kg)  10/04/21 105 lb (47.6 kg)  08/16/21 107 lb (48.5 kg)      Physical Exam Vitals and nursing note reviewed.  HENT:     Head: Normocephalic.     Nose: Nose normal.     Mouth/Throat:     Mouth: Mucous membranes are moist.     Pharynx: Oropharynx is clear.  Eyes:     Conjunctiva/sclera: Conjunctivae normal.  Cardiovascular:     Pulses: Normal pulses.     Heart sounds: Normal heart sounds.  Pulmonary:     Effort: Pulmonary effort is normal.     Breath sounds: Normal breath sounds.  Abdominal:     General: Bowel sounds are normal.  Skin:    General: Skin is warm.     Findings: No rash.  Neurological:     General: No focal deficit present.     Mental Status: She is oriented to person, place, and time.    Results for orders placed or performed in visit on 11/27/21  WET PREP FOR [place], YEAST, CLUE   Specimen: Vaginal Fluid   Vaginal Flui  Result Value Ref Range   Trichomonas Exam Negative Negative   Yeast Exam Negative Negative   Clue Cell Exam Negative Negative  Microscopic Examination   Urine  Result Value Ref Range   WBC, UA 6-10 (A) 0 - 5 /hpf   RBC None seen 0 - 2 /hpf  Epithelial Cells (non renal) 0-10 0 - 10 /hpf   Renal Epithel, UA None seen None seen /hpf   Bacteria, UA None seen None seen/Few  Urinalysis, Complete  Result Value Ref Range   Specific Gravity, UA <1.005 (L) 1.005 - 1.030   pH, UA 5.5 5.0 - 7.5   Color, UA Yellow Yellow   Appearance Ur Clear Clear   Leukocytes,UA Negative Negative   Protein,UA Negative Negative/Trace   Glucose, UA Negative Negative   Ketones, UA Negative Negative   RBC, UA Negative Negative   Bilirubin, UA Negative Negative   Urobilinogen, Ur 0.2 0.2 - 1.0 mg/dL   Nitrite, UA Negative Negative   Microscopic Examination See below:         Assessment & Plan:  Urinalysis negative for UTI Wet prep completed results pending Patient recently completed 2 rounds of antibiotics and developed vaginal  itching and discharge. Diflucan 150 mg tablet by mouth once. Follow-up with unresolved symptoms.  Problem List Items Addressed This Visit   None Visit Diagnoses     Dysuria    -  Primary   Relevant Orders   Urinalysis, Complete (Completed)   WET PREP FOR [place], YEAST, CLUE (Completed)   Vaginal discharge       Relevant Medications   fluconazole (DIFLUCAN) 150 MG tablet       Meds ordered this encounter  Medications   fluconazole (DIFLUCAN) 150 MG tablet    Sig: Take 1 tablet (150 mg total) by mouth once for 1 dose.    Dispense:  1 tablet    Refill:  0    Order Specific Question:   Supervising Provider    Answer:   Claretta Fraise [phone]    No follow-ups on file.  Ivy Lynn, NP

## 2021-11-27 NOTE — Telephone Encounter (Signed)
Pt believes she has a yeast infection from taking the antibiotics she has been on for 20 days or so.   Wants to know if PCP can call her in medicine.

## 2021-11-27 NOTE — Patient Instructions (Signed)

## 2021-11-28 ENCOUNTER — Inpatient Hospital Stay (HOSPITAL_COMMUNITY): Payer: Medicare Other

## 2021-11-28 VITALS — BP 111/59 | HR 70 | Temp 98.0°F | Resp 18

## 2021-11-28 DIAGNOSIS — D500 Iron deficiency anemia secondary to blood loss (chronic): Secondary | ICD-10-CM

## 2021-11-28 DIAGNOSIS — D509 Iron deficiency anemia, unspecified: Secondary | ICD-10-CM | POA: Diagnosis not present

## 2021-11-28 MED ORDER — SODIUM CHLORIDE 0.9 % IV SOLN: Freq: Once | INTRAVENOUS | Status: AC

## 2021-11-28 MED ORDER — LORATADINE 10 MG PO TABS
10.0000 mg | ORAL_TABLET | Freq: Once | ORAL | Status: AC
  Administered 2021-11-28: 10 mg via ORAL
  Filled 2021-11-28: qty 1

## 2021-11-28 MED ORDER — SODIUM CHLORIDE 0.9 % IV SOLN
300.0000 mg | Freq: Once | INTRAVENOUS | Status: AC
  Administered 2021-11-28: 300 mg via INTRAVENOUS
  Filled 2021-11-28: qty 300

## 2021-11-28 MED ORDER — ACETAMINOPHEN 325 MG PO TABS
650.0000 mg | ORAL_TABLET | Freq: Once | ORAL | Status: AC
  Administered 2021-11-28: 650 mg via ORAL
  Filled 2021-11-28: qty 2

## 2021-11-28 NOTE — Progress Notes (Signed)
Patient presents today for iron infusion.  Patient is in satisfactory condition with no new complaints voiced.  Vital signs are stable.  We will proceed with treatment per MD orders.   Patient tolerated treatment well with no complaints voiced.  Patient left ambulatory in stable condition.  Vital signs stable at discharge.  Follow up as scheduled.    

## 2021-11-28 NOTE — Patient Instructions (Signed)
[facility]  Discharge Instructions: Thank you for choosing [facility] to provide your oncology and hematology care.  If you have a lab appointment with the Cancer Center, please come in thru the Main Entrance and check in at the main information desk.  Wear comfortable clothing and clothing appropriate for easy access to any Portacath or PICC line.   We strive to give you quality time with your provider. You may need to reschedule your appointment if you arrive late (15 or more minutes).  Arriving late affects you and other patients whose appointments are after yours.  Also, if you miss three or more appointments without notifying the office, you may be dismissed from the clinic at the provider's discretion.      For prescription refill requests, have your pharmacy contact our office and allow 72 hours for refills to be completed.        To help prevent nausea and vomiting after your treatment, we encourage you to take your nausea medication as directed.  BELOW ARE SYMPTOMS THAT SHOULD BE REPORTED IMMEDIATELY: *FEVER GREATER THAN 100.4 F (38 C) OR HIGHER *CHILLS OR SWEATING *NAUSEA AND VOMITING THAT IS NOT CONTROLLED WITH YOUR NAUSEA MEDICATION *UNUSUAL SHORTNESS OF BREATH *UNUSUAL BRUISING OR BLEEDING *URINARY PROBLEMS (pain or burning when urinating, or frequent urination) *BOWEL PROBLEMS (unusual diarrhea, constipation, pain near the anus) TENDERNESS IN MOUTH AND THROAT WITH OR WITHOUT PRESENCE OF ULCERS (sore throat, sores in mouth, or a toothache) UNUSUAL RASH, SWELLING OR PAIN  UNUSUAL VAGINAL DISCHARGE OR ITCHING   Items with * indicate a potential emergency and should be followed up as soon as possible or go to the Emergency Department if any problems should occur.  Please show the CHEMOTHERAPY ALERT CARD or IMMUNOTHERAPY ALERT CARD at check-in to the Emergency Department and triage nurse.  Should you have questions after your visit or need to cancel  or reschedule your appointment, please contact [facility] [phone]  and follow the prompts.  Office hours are 8:00 a.m. to 4:30 p.m. Monday - Friday. Please note that voicemails left after 4:00 p.m. may not be returned until the following business day.  We are closed weekends and major holidays. You have access to a nurse at all times for urgent questions. Please call the main number to the clinic [phone] and follow the prompts.  For any non-urgent questions, you may also contact your provider using MyChart. We now offer e-Visits for anyone 18 and older to request care online for non-urgent symptoms. For details visit [email or web address].   Also download the MyChart app! Go to the app store, search "MyChart", open the app, select [facility], and log in with your MyChart username and password.  Due to Covid, a mask is required upon entering the hospital/clinic. If you do not have a mask, one will be given to you upon arrival. For doctor visits, patients may have 1 support person aged 18 or older with them. For treatment visits, patients cannot have anyone with them due to current Covid guidelines and our immunocompromised population.  

## 2021-12-04 ENCOUNTER — Ambulatory Visit
Admission: RE | Admit: 2021-12-04 | Discharge: 2021-12-04 | Disposition: A | Discharge status: Home / Self Care | Payer: Medicare Other | Source: Ambulatory Visit | Attending: Gastroenterology | Admitting: Gastroenterology

## 2021-12-04 ENCOUNTER — Other Ambulatory Visit: Payer: Medicare Other

## 2021-12-04 DIAGNOSIS — R109 Unspecified abdominal pain: Secondary | ICD-10-CM

## 2021-12-04 DIAGNOSIS — N83291 Other ovarian cyst, right side: Secondary | ICD-10-CM | POA: Diagnosis not present

## 2021-12-04 DIAGNOSIS — R197 Diarrhea, unspecified: Secondary | ICD-10-CM | POA: Diagnosis not present

## 2021-12-04 MED ORDER — IOPAMIDOL (ISOVUE-300) INJECTION 61%
75.0000 mL | Freq: Once | INTRAVENOUS | Status: AC | PRN
  Administered 2021-12-04: 75 mL via INTRAVENOUS

## 2021-12-06 ENCOUNTER — Encounter (HOSPITAL_COMMUNITY): Payer: Self-pay

## 2021-12-06 ENCOUNTER — Inpatient Hospital Stay (HOSPITAL_COMMUNITY): Payer: Medicare Other | Attending: Hematology

## 2021-12-06 VITALS — BP 127/41 | HR 71 | Temp 98.1°F | Resp 16

## 2021-12-06 DIAGNOSIS — D509 Iron deficiency anemia, unspecified: Secondary | ICD-10-CM | POA: Insufficient documentation

## 2021-12-06 DIAGNOSIS — D500 Iron deficiency anemia secondary to blood loss (chronic): Secondary | ICD-10-CM

## 2021-12-06 MED ORDER — ACETAMINOPHEN 325 MG PO TABS
650.0000 mg | ORAL_TABLET | Freq: Once | ORAL | Status: AC
  Administered 2021-12-06: 650 mg via ORAL
  Filled 2021-12-06: qty 2

## 2021-12-06 MED ORDER — SODIUM CHLORIDE 0.9 % IV SOLN: Freq: Once | INTRAVENOUS | Status: AC

## 2021-12-06 MED ORDER — SODIUM CHLORIDE 0.9 % IV SOLN
300.0000 mg | Freq: Once | INTRAVENOUS | Status: AC
  Administered 2021-12-06: 300 mg via INTRAVENOUS
  Filled 2021-12-06: qty 300

## 2021-12-06 MED ORDER — LORATADINE 10 MG PO TABS
10.0000 mg | ORAL_TABLET | Freq: Once | ORAL | Status: AC
  Administered 2021-12-06: 10 mg via ORAL
  Filled 2021-12-06: qty 1

## 2021-12-06 NOTE — Patient Instructions (Signed)
[facility]  Discharge Instructions: Thank you for choosing [facility] to provide your oncology and hematology care.  If you have a lab appointment with the [facility], please come in thru the Main Entrance and check in at the main information desk.  Wear comfortable clothing and clothing appropriate for easy access to any Portacath or PICC line.   We strive to give you quality time with your provider. You may need to reschedule your appointment if you arrive late (15 or more minutes).  Arriving late affects you and other patients whose appointments are after yours.  Also, if you miss three or more appointments without notifying the office, you may be dismissed from the clinic at the provider's discretion.      For prescription refill requests, have your pharmacy contact our office and allow 72 hours for refills to be completed.    Today you received the following chemotherapy and/or immunotherapy agents : Venofer 300 mg .      To help prevent nausea and vomiting after your treatment, we encourage you to take your nausea medication as directed.  BELOW ARE SYMPTOMS THAT SHOULD BE REPORTED IMMEDIATELY: *FEVER GREATER THAN 100.4 F (38 C) OR HIGHER *CHILLS OR SWEATING *NAUSEA AND VOMITING THAT IS NOT CONTROLLED WITH YOUR NAUSEA MEDICATION *UNUSUAL SHORTNESS OF BREATH *UNUSUAL BRUISING OR BLEEDING *URINARY PROBLEMS (pain or burning when urinating, or frequent urination) *BOWEL PROBLEMS (unusual diarrhea, constipation, pain near the anus) TENDERNESS IN MOUTH AND THROAT WITH OR WITHOUT PRESENCE OF ULCERS (sore throat, sores in mouth, or a toothache) UNUSUAL RASH, SWELLING OR PAIN  UNUSUAL VAGINAL DISCHARGE OR ITCHING   Items with * indicate a potential emergency and should be followed up as soon as possible or go to the Emergency Department if any problems should occur.  Please show the CHEMOTHERAPY ALERT CARD or IMMUNOTHERAPY ALERT CARD at check-in to the  Emergency Department and triage nurse.  Should you have questions after your visit or need to cancel or reschedule your appointment, please contact [facility] [phone]  and follow the prompts.  Office hours are 8:00 a.m. to 4:30 p.m. Monday - Friday. Please note that voicemails left after 4:00 p.m. may not be returned until the following business day.  We are closed weekends and major holidays. You have access to a nurse at all times for urgent questions. Please call the main number to the clinic [phone] and follow the prompts.  For any non-urgent questions, you may also contact your provider using MyChart. We now offer e-Visits for anyone 15 and older to request care online for non-urgent symptoms. For details visit [email or web address].   Also download the MyChart app! Go to the app store, search "MyChart", open the app, select [facility], and log in with your MyChart username and password.  Due to Covid, a mask is required upon entering the hospital/clinic. If you do not have a mask, one will be given to you upon arrival. For doctor visits, patients may have 1 support person aged 75 or older with them. For treatment visits, patients cannot have anyone with them due to current Covid guidelines and our immunocompromised population.

## 2021-12-06 NOTE — Progress Notes (Signed)
Patient presents today for Venofer 300 mg infusion. Vital signs are stable. Patient denies any changes since the last iron infusion. Patient has complaints of fatigue not relieved with rest. Patient has shortness of breath with ambulating and has to rest walking in her home per patient's. MAR reviewed and updated.    Venofer 300 mg given today per MD orders. Tolerated infusion without adverse affects. Vital signs stable. No complaints at this time. [name] Alert and oriented x 3. F/U with [facility] as scheduled.

## 2021-12-20 ENCOUNTER — Other Ambulatory Visit (HOSPITAL_COMMUNITY): Payer: Medicare Other

## 2021-12-27 ENCOUNTER — Telehealth (HOSPITAL_COMMUNITY): Payer: Medicare Other | Admitting: Physician Assistant

## 2022-01-03 ENCOUNTER — Other Ambulatory Visit: Payer: Self-pay | Admitting: Nurse Practitioner

## 2022-01-06 NOTE — Chronic Care Management (AMB) (Signed)
Erroneous encounter. Disregard please

## 2022-01-17 ENCOUNTER — Inpatient Hospital Stay (HOSPITAL_COMMUNITY): Payer: Medicare Other | Attending: Hematology

## 2022-01-17 DIAGNOSIS — D509 Iron deficiency anemia, unspecified: Secondary | ICD-10-CM | POA: Insufficient documentation

## 2022-01-17 DIAGNOSIS — D500 Iron deficiency anemia secondary to blood loss (chronic): Secondary | ICD-10-CM

## 2022-01-17 LAB — CBC WITH DIFFERENTIAL/PLATELET
Abs Immature Granulocytes: 0.02 10*3/uL (ref 0.00–0.07)
Basophils Absolute: 0.1 10*3/uL (ref 0.0–0.1)
Basophils Relative: 1 %
Eosinophils Absolute: 0.1 10*3/uL (ref 0.0–0.5)
Eosinophils Relative: 2 %
HCT: 35.5 % — ABNORMAL LOW (ref 36.0–46.0)
Hemoglobin: 11.3 g/dL — ABNORMAL LOW (ref 12.0–15.0)
Immature Granulocytes: 0 %
Lymphocytes Relative: 19 %
Lymphs Abs: 1.1 10*3/uL (ref 0.7–4.0)
MCH: 29.3 pg (ref 26.0–34.0)
MCHC: 31.8 g/dL (ref 30.0–36.0)
MCV: 92 fL (ref 80.0–100.0)
Monocytes Absolute: 0.4 10*3/uL (ref 0.1–1.0)
Monocytes Relative: 6 %
Neutro Abs: 4.3 10*3/uL (ref 1.7–7.7)
Neutrophils Relative %: 72 %
Platelets: 241 10*3/uL (ref 150–400)
RBC: 3.86 MIL/uL — ABNORMAL LOW (ref 3.87–5.11)
RDW: 13.8 % (ref 11.5–15.5)
WBC: 6 10*3/uL (ref 4.0–10.5)
nRBC: 0 % (ref 0.0–0.2)

## 2022-01-17 LAB — IRON AND TIBC
Iron: 55 ug/dL (ref 28–170)
Saturation Ratios: 16 % (ref 10.4–31.8)
TIBC: 335 ug/dL (ref 250–450)
UIBC: 280 ug/dL

## 2022-01-17 LAB — FERRITIN: Ferritin: 51 ng/mL (ref 11–307)

## 2022-01-25 NOTE — Progress Notes (Unsigned)
Virtual Visit via Telephone Note [facility]  I connected with Haley Trevino  on 01/27/22 at  4:09 PM by telephone and verified that I am speaking with the correct person using two identifiers.  Location: Patient: Home Provider: [facility]   I discussed the limitations, risks, security and privacy concerns of performing an evaluation and management service by telephone and the availability of in person appointments. I also discussed with the patient that there may be a patient responsible charge related to this service. The patient expressed understanding and agreed to proceed.  REASON FOR VISIT:  Follow-up for iron deficiency anemia   CURRENT THERAPY: Intermittent IV iron infusions (last Venofer 12/06/2021) and PRBC transfusions (last blood transfusion on 12/14/2020)  INTERVAL HISTORY: [name] is contacted today for follow-up of her iron deficiency anemia secondary to chronic GI blood loss.  She was last evaluated via telemedicine visit by Tarri Abernethy PA-C on 09/20/2021.  Most recent IV iron was given on 12/06/2021.  At today's visit, she reports feeling poorly.  She has not had any hospitalizations or required any blood transfusions since her last visit.  She reports increasing fatigue and exhaustion as well as "dizzy spells" and tremors.  No bright red blood per rectum or melena.  She has some chronic dyspnea on exertion.  She denies any chest pain, lightheadedness, syncope, or pica.  She has little to no energy and 75 % appetite.  She reports that she is maintaining a stable weight.   She continues to suffer from chronic chest wall pain secondary to complications and scarring from removal of breast implants, as well as her scleroderma.  She has multiple other complaints unrelated to today's visit including vertigo, hearing loss, worsening vision, and joint pain.  She feels stressed as she is trying to keep up with her own health issues as  well as her husband's history of stroke and lung cancer several years ago.    OBSERVATIONS/OBJECTIVE: Review of Systems  Constitutional:  Positive for malaise/fatigue. Negative for chills, diaphoresis, fever and weight loss.  Respiratory:  Positive for shortness of breath (with exertion). Negative for cough.   Cardiovascular:  Positive for chest pain (chest wall pain). Negative for palpitations.  Gastrointestinal:  Negative for abdominal pain, blood in stool, melena, nausea and vomiting.  Genitourinary:  Positive for frequency.  Neurological:  Positive for dizziness, tingling and headaches.  Psychiatric/Behavioral:  Positive for depression. The patient has insomnia.      PHYSICAL EXAM (per limitations of virtual telephone visit): The patient is alert and oriented x 3, exhibiting adequate mentation, good mood, and ability to speak in full sentences and execute sound judgement.   ASSESSMENT & PLAN: 1.  Iron deficiency anemia - Initially referred to hematology by PCP after labs obtained were 09/21/2020 significant for normocytic anemia (Hgb 10.2, MCV 83.0) and iron deficiency (ferritin 13) - EGD (10/25/2020): 2 angiodysplastic lesions without bleeding in the duodenum, another angiodysplastic lesion in duodenum; treated by APC - Capsule study (11/29/2020): At least 3 proximal and mid small bowel AVMs, nonbleeding, but also with some possible red material in the proximal colon - Other work-up: SPEP/IFE/light chains unremarkable; LDH normal; CMP unremarkable  - Blood transfusion: PRBC x1 on 12/14/2020 due to Hgb 7.0  - She has required frequent IV iron, most recently from 11/21/2021 through 12/06/2021) - She continues to follow with GI (Dr. Watt Climes via [facility]) - She reports that her dark stool/melena resolved in August 2022      -  She is symptomatic with significant fatigue  - Most recent labs (01/17/2022): Hgb 11.3/MCV 92.0, ferritin 51, iron saturation 16 %.  Normal B12/methylmalonic  acid and folate/homocystine when checked in March 2023. - Differential diagnosis favors iron deficiency anemia secondary to chronic occult GI blood loss; may also be an aspect of malabsorption in the setting of scleroderma, low meat intake, and use of PPI - PLAN: Recommend IV Venofer 300 mg x 3. - RTC in 3 months with repeat labs and office visit - Continue follow-up with gastroenterology for ongoing management of GI bleeding.   I discussed the assessment and treatment plan with the patient. The patient was provided an opportunity to ask questions and all were answered. The patient agreed with the plan and demonstrated an understanding of the instructions.   The patient was advised to call back or seek an in-person evaluation if the symptoms worsen or if the condition fails to improve as anticipated.  I provided 23 minutes of non-face-to-face time during this encounter.   Harriett Rush, PA-C 01/27/2022 7:28 PM

## 2022-01-27 ENCOUNTER — Inpatient Hospital Stay (HOSPITAL_BASED_OUTPATIENT_CLINIC_OR_DEPARTMENT_OTHER): Payer: Medicare Other | Admitting: Physician Assistant

## 2022-01-27 DIAGNOSIS — D500 Iron deficiency anemia secondary to blood loss (chronic): Secondary | ICD-10-CM | POA: Diagnosis not present

## 2022-01-27 DIAGNOSIS — Q2733 Arteriovenous malformation of digestive system vessel: Secondary | ICD-10-CM | POA: Diagnosis not present

## 2022-01-29 ENCOUNTER — Inpatient Hospital Stay (HOSPITAL_COMMUNITY): Payer: Medicare Other

## 2022-01-29 VITALS — BP 138/54 | HR 71 | Temp 98.6°F | Resp 18

## 2022-01-29 DIAGNOSIS — D500 Iron deficiency anemia secondary to blood loss (chronic): Secondary | ICD-10-CM

## 2022-01-29 DIAGNOSIS — D509 Iron deficiency anemia, unspecified: Secondary | ICD-10-CM | POA: Diagnosis not present

## 2022-01-29 MED ORDER — SODIUM CHLORIDE 0.9 % IV SOLN
300.0000 mg | Freq: Once | INTRAVENOUS | Status: AC
  Administered 2022-01-29: 300 mg via INTRAVENOUS
  Filled 2022-01-29: qty 15

## 2022-01-29 MED ORDER — ACETAMINOPHEN 325 MG PO TABS
650.0000 mg | ORAL_TABLET | Freq: Once | ORAL | Status: AC
  Administered 2022-01-29: 650 mg via ORAL
  Filled 2022-01-29: qty 2

## 2022-01-29 MED ORDER — SODIUM CHLORIDE 0.9 % IV SOLN: Freq: Once | INTRAVENOUS | Status: AC

## 2022-01-29 MED ORDER — LORATADINE 10 MG PO TABS
10.0000 mg | ORAL_TABLET | Freq: Once | ORAL | Status: AC
  Administered 2022-01-29: 10 mg via ORAL
  Filled 2022-01-29: qty 1

## 2022-01-29 NOTE — Patient Instructions (Signed)
[facility]  Discharge Instructions: Thank you for choosing [facility] to provide your oncology and hematology care.  If you have a lab appointment with the [facility], please come in thru the Main Entrance and check in at the main information desk.  Wear comfortable clothing and clothing appropriate for easy access to any Portacath or PICC line.   We strive to give you quality time with your provider. You may need to reschedule your appointment if you arrive late (15 or more minutes).  Arriving late affects you and other patients whose appointments are after yours.  Also, if you miss three or more appointments without notifying the office, you may be dismissed from the clinic at the provider's discretion.      For prescription refill requests, have your pharmacy contact our office and allow 72 hours for refills to be completed.    Iron Sucrose Injection What is this medication? IRON SUCROSE (EYE ern SOO krose) treats low levels of iron (iron deficiency anemia) in people with kidney disease. Iron is a mineral that plays an important role in making red blood cells, which carry oxygen from your lungs to the rest of your body. This medicine may be used for other purposes; ask your health care provider or pharmacist if you have questions. COMMON BRAND NAME(S): Venofer What should I tell my care team before I take this medication? They need to know if you have any of these conditions: Anemia not caused by low iron levels Heart disease High levels of iron in the blood Kidney disease Liver disease An unusual or allergic reaction to iron, other medications, foods, dyes, or preservatives Pregnant or trying to get pregnant Breast-feeding How should I use this medication? This medication is for infusion into a vein. It is given in a hospital or clinic setting. Talk to your care team about the use of this medication in children. While this medication may be prescribed  for children as young as 2 years for selected conditions, precautions do apply. Overdosage: If you think you have taken too much of this medicine contact a poison control center or emergency room at once. NOTE: This medicine is only for you. Do not share this medicine with others. What if I miss a dose? It is important not to miss your dose. Call your care team if you are unable to keep an appointment. What may interact with this medication? Do not take this medication with any of the following: Deferoxamine Dimercaprol Other iron products This medication may also interact with the following: Chloramphenicol Deferasirox This list may not describe all possible interactions. Give your health care provider a list of all the medicines, herbs, non-prescription drugs, or dietary supplements you use. Also tell them if you smoke, drink alcohol, or use illegal drugs. Some items may interact with your medicine. What should I watch for while using this medication? Visit your care team regularly. Tell your care team if your symptoms do not start to get better or if they get worse. You may need blood work done while you are taking this medication. You may need to follow a special diet. Talk to your care team. Foods that contain iron include: whole grains/cereals, dried fruits, beans, or peas, leafy green vegetables, and organ meats (liver, kidney). What side effects may I notice from receiving this medication? Side effects that you should report to your care team as soon as possible: Allergic reactions--skin rash, itching, hives, swelling of the face, lips, tongue,  or throat Low blood pressure--dizziness, feeling faint or lightheaded, blurry vision Shortness of breath Side effects that usually do not require medical attention (report to your care team if they continue or are bothersome): Flushing Headache Joint pain Muscle pain Nausea Pain, redness, or irritation at injection site This list may not  describe all possible side effects. Call your doctor for medical advice about side effects. You may report side effects to FDA at 1-800-FDA-1088. Where should I keep my medication? This medication is given in a hospital or clinic and will not be stored at home. NOTE: This sheet is a summary. It may not cover all possible information. If you have questions about this medicine, talk to your doctor, pharmacist, or health care provider.  2023 Elsevier/Gold Standard (2020-11-16 00:00:00)    To help prevent nausea and vomiting after your treatment, we encourage you to take your nausea medication as directed.  BELOW ARE SYMPTOMS THAT SHOULD BE REPORTED IMMEDIATELY: *FEVER GREATER THAN 100.4 F (38 C) OR HIGHER *CHILLS OR SWEATING *NAUSEA AND VOMITING THAT IS NOT CONTROLLED WITH YOUR NAUSEA MEDICATION *UNUSUAL SHORTNESS OF BREATH *UNUSUAL BRUISING OR BLEEDING *URINARY PROBLEMS (pain or burning when urinating, or frequent urination) *BOWEL PROBLEMS (unusual diarrhea, constipation, pain near the anus) TENDERNESS IN MOUTH AND THROAT WITH OR WITHOUT PRESENCE OF ULCERS (sore throat, sores in mouth, or a toothache) UNUSUAL RASH, SWELLING OR PAIN  UNUSUAL VAGINAL DISCHARGE OR ITCHING   Items with * indicate a potential emergency and should be followed up as soon as possible or go to the Emergency Department if any problems should occur.  Please show the CHEMOTHERAPY ALERT CARD or IMMUNOTHERAPY ALERT CARD at check-in to the Emergency Department and triage nurse.  Should you have questions after your visit or need to cancel or reschedule your appointment, please contact [facility] [phone]  and follow the prompts.  Office hours are 8:00 a.m. to 4:30 p.m. Monday - Friday. Please note that voicemails left after 4:00 p.m. may not be returned until the following business day.  We are closed weekends and major holidays. You have access to a nurse at all times for urgent questions. Please call  the main number to the clinic [phone] and follow the prompts.  For any non-urgent questions, you may also contact your provider using MyChart. We now offer e-Visits for anyone 17 and older to request care online for non-urgent symptoms. For details visit [email or web address].   Also download the MyChart app! Go to the app store, search "MyChart", open the app, select [facility], and log in with your MyChart username and password.  Masks are optional in the cancer centers. If you would like for your care team to wear a mask while they are taking care of you, please let them know. For doctor visits, patients may have with them one support person who is at least 80 years old. At this time, visitors are not allowed in the infusion area.

## 2022-01-29 NOTE — Progress Notes (Signed)
Patient presents today for iron infusion.  Patient has several complaints today.  She complains of feeling weak and tired.  She has discussed this with her GI specialist.  Vital signs are stable.  We will proceed with treatment per MD orders.   Patient tolerated treatment well with no complaints voiced.  Patient left ambulatory in stable condition.  Vital signs stable at discharge.  Follow up as scheduled.

## 2022-02-12 ENCOUNTER — Inpatient Hospital Stay: Payer: Medicare Other | Attending: Hematology

## 2022-02-12 VITALS — BP 129/54 | HR 65 | Temp 97.2°F | Resp 18

## 2022-02-12 DIAGNOSIS — K922 Gastrointestinal hemorrhage, unspecified: Secondary | ICD-10-CM | POA: Insufficient documentation

## 2022-02-12 DIAGNOSIS — D500 Iron deficiency anemia secondary to blood loss (chronic): Secondary | ICD-10-CM | POA: Diagnosis not present

## 2022-02-12 MED ORDER — SODIUM CHLORIDE 0.9 % IV SOLN
300.0000 mg | Freq: Once | INTRAVENOUS | Status: AC
  Administered 2022-02-12: 300 mg via INTRAVENOUS
  Filled 2022-02-12: qty 300

## 2022-02-12 MED ORDER — ACETAMINOPHEN 325 MG PO TABS
650.0000 mg | ORAL_TABLET | Freq: Once | ORAL | Status: AC
  Administered 2022-02-12: 650 mg via ORAL

## 2022-02-12 MED ORDER — LORATADINE 10 MG PO TABS
10.0000 mg | ORAL_TABLET | Freq: Once | ORAL | Status: AC
  Administered 2022-02-12: 10 mg via ORAL

## 2022-02-12 MED ORDER — SODIUM CHLORIDE 0.9 % IV SOLN: Freq: Once | INTRAVENOUS | Status: AC

## 2022-02-12 NOTE — Patient Instructions (Signed)
[facility]  Discharge Instructions: Thank you for choosing [facility] to provide your oncology and hematology care.  If you have a lab appointment with the [facility], please come in thru the Main Entrance and check in at the main information desk.  Wear comfortable clothing and clothing appropriate for easy access to any Portacath or PICC line.   We strive to give you quality time with your provider. You may need to reschedule your appointment if you arrive late (15 or more minutes).  Arriving late affects you and other patients whose appointments are after yours.  Also, if you miss three or more appointments without notifying the office, you may be dismissed from the clinic at the provider's discretion.      For prescription refill requests, have your pharmacy contact our office and allow 72 hours for refills to be completed.    Today you received Venofer     BELOW ARE SYMPTOMS THAT SHOULD BE REPORTED IMMEDIATELY: *FEVER GREATER THAN 100.4 F (38 C) OR HIGHER *CHILLS OR SWEATING *NAUSEA AND VOMITING THAT IS NOT CONTROLLED WITH YOUR NAUSEA MEDICATION *UNUSUAL SHORTNESS OF BREATH *UNUSUAL BRUISING OR BLEEDING *URINARY PROBLEMS (pain or burning when urinating, or frequent urination) *BOWEL PROBLEMS (unusual diarrhea, constipation, pain near the anus) TENDERNESS IN MOUTH AND THROAT WITH OR WITHOUT PRESENCE OF ULCERS (sore throat, sores in mouth, or a toothache) UNUSUAL RASH, SWELLING OR PAIN  UNUSUAL VAGINAL DISCHARGE OR ITCHING   Items with * indicate a potential emergency and should be followed up as soon as possible or go to the Emergency Department if any problems should occur.  Please show the CHEMOTHERAPY ALERT CARD or IMMUNOTHERAPY ALERT CARD at check-in to the Emergency Department and triage nurse.  Should you have questions after your visit or need to cancel or reschedule your appointment, please contact [facility]  [phone]  and follow the prompts.  Office hours are 8:00 a.m. to 4:30 p.m. Monday - Friday. Please note that voicemails left after 4:00 p.m. may not be returned until the following business day.  We are closed weekends and major holidays. You have access to a nurse at all times for urgent questions. Please call the main number to the clinic [phone] and follow the prompts.  For any non-urgent questions, you may also contact your provider using MyChart. We now offer e-Visits for anyone 21 and older to request care online for non-urgent symptoms. For details visit [email or web address].   Also download the MyChart app! Go to the app store, search "MyChart", open the app, select [facility], and log in with your MyChart username and password.  Masks are optional in the cancer centers. If you would like for your care team to wear a mask while they are taking care of you, please let them know. For doctor visits, patients may have with them one support person who is at least 80 years old. At this time, visitors are not allowed in the infusion area.

## 2022-02-12 NOTE — Progress Notes (Signed)
Pt presents today for Venofer IV iron infusion per provider's order. Vital signs stable and pt voiced new complaints at this time.  Peripheral IV started with good blood return pre and post infusion.  Venofer 300 mg given today per MD orders. Tolerated infusion without adverse affects. Vital signs stable. No complaints at this time. [name] Alert and oriented x 3. F/U with [facility] as scheduled.

## 2022-02-13 ENCOUNTER — Encounter: Payer: Self-pay | Admitting: Nurse Practitioner

## 2022-02-13 ENCOUNTER — Ambulatory Visit (INDEPENDENT_AMBULATORY_CARE_PROVIDER_SITE_OTHER): Payer: Medicare Other | Admitting: Nurse Practitioner

## 2022-02-13 VITALS — BP 135/61 | HR 71 | Temp 98.3°F | Resp 20 | Ht 62.0 in | Wt 103.0 lb

## 2022-02-13 DIAGNOSIS — R0602 Shortness of breath: Secondary | ICD-10-CM

## 2022-02-13 DIAGNOSIS — F3342 Major depressive disorder, recurrent, in full remission: Secondary | ICD-10-CM

## 2022-02-13 DIAGNOSIS — K582 Mixed irritable bowel syndrome: Secondary | ICD-10-CM | POA: Diagnosis not present

## 2022-02-13 DIAGNOSIS — I10 Essential (primary) hypertension: Secondary | ICD-10-CM | POA: Diagnosis not present

## 2022-02-13 DIAGNOSIS — M349 Systemic sclerosis, unspecified: Secondary | ICD-10-CM

## 2022-02-13 DIAGNOSIS — I7300 Raynaud's syndrome without gangrene: Secondary | ICD-10-CM

## 2022-02-13 DIAGNOSIS — M810 Age-related osteoporosis without current pathological fracture: Secondary | ICD-10-CM | POA: Diagnosis not present

## 2022-02-13 DIAGNOSIS — E039 Hypothyroidism, unspecified: Secondary | ICD-10-CM | POA: Diagnosis not present

## 2022-02-13 DIAGNOSIS — F419 Anxiety disorder, unspecified: Secondary | ICD-10-CM

## 2022-02-13 DIAGNOSIS — R634 Abnormal weight loss: Secondary | ICD-10-CM

## 2022-02-13 DIAGNOSIS — K219 Gastro-esophageal reflux disease without esophagitis: Secondary | ICD-10-CM

## 2022-02-13 DIAGNOSIS — D500 Iron deficiency anemia secondary to blood loss (chronic): Secondary | ICD-10-CM

## 2022-02-13 DIAGNOSIS — E785 Hyperlipidemia, unspecified: Secondary | ICD-10-CM

## 2022-02-13 MED ORDER — SILDENAFIL CITRATE 20 MG PO TABS: 20.0000 mg | ORAL_TABLET | Freq: Two times a day (BID) | ORAL | 1 refills | Status: DC

## 2022-02-13 MED ORDER — SYNTHROID 75 MCG PO TABS: ORAL_TABLET | ORAL | 1 refills | Status: DC

## 2022-02-13 MED ORDER — FLUOXETINE HCL 40 MG PO CAPS: 40.0000 mg | ORAL_CAPSULE | Freq: Every day | ORAL | 1 refills | Status: DC

## 2022-02-13 MED ORDER — NIFEDIPINE ER OSMOTIC RELEASE 60 MG PO TB24: 60.0000 mg | ORAL_TABLET | Freq: Every day | ORAL | 1 refills | Status: DC

## 2022-02-13 MED ORDER — CLONAZEPAM 0.5 MG PO TABS: 0.5000 mg | ORAL_TABLET | Freq: Two times a day (BID) | ORAL | 1 refills | Status: DC | PRN

## 2022-02-13 MED ORDER — EZETIMIBE 10 MG PO TABS: 10.0000 mg | ORAL_TABLET | Freq: Every day | ORAL | 1 refills | Status: DC

## 2022-02-13 MED ORDER — OMEPRAZOLE 20 MG PO CPDR: DELAYED_RELEASE_CAPSULE | ORAL | 1 refills | Status: DC

## 2022-02-13 MED ORDER — DOXYCYCLINE HYCLATE 100 MG PO TABS: 100.0000 mg | ORAL_TABLET | Freq: Two times a day (BID) | ORAL | 0 refills | Status: DC

## 2022-02-13 NOTE — Addendum Note (Signed)
Addended by: [name] on: 02/13/2022 02:53 PM   Modules accepted: Orders

## 2022-02-13 NOTE — Patient Instructions (Addendum)
Iron Deficiency Anemia, Adult  Iron deficiency anemia is when you do not have enough red blood cells or hemoglobin in your blood. This happens because you have too little iron in your body. Hemoglobin carries oxygen to parts of the body. Anemia can cause your body to not get enough oxygen. What are the causes? Not eating enough foods that have iron in them. The body not being able to take in iron well. Blood loss. What increases the risk? Having menstrual periods. Being pregnant. What are the signs or symptoms? Pale skin, lips, and nails. Weakness, dizziness, and getting tired easily. Feeling like you cannot breathe well when moving (shortness of breath). Cold hands and feet. Mild anemia may not cause any symptoms. How is this treated? This condition is treated by finding out why you do not have enough iron and then getting more iron. It may include: Adding foods to your diet that have a lot of iron. Taking iron pills (supplements). If you are pregnant or breastfeeding, you may need to take extra iron. Your diet often does not provide the amount of iron that you need. Getting more vitamin C in your diet. Vitamin C helps your body take in iron. You may need to take iron pills with a glass of orange juice or vitamin C pills. Medicines to make heavy menstrual periods lighter. Surgery or testing procedures to find what is causing the condition. You may need blood tests to see if treatment is working. If the treatment does not seem to be working, you may need more tests. Follow these instructions at home: Medicines Take over-the-counter and prescription medicines only as told by your doctor. This includes iron pills and vitamins. Taking them as told is important because too much iron can be harmful. Take iron pills when your stomach is empty. If you cannot handle this, take them with food. Do not drink milk or take antacids at the same time as your iron pills. Iron pills may turn your poop  (stool)black. If you cannot handle taking iron pills by mouth, ask your doctor about getting iron through: An IV tube. A shot (injection) into a muscle. Eating and drinking Talk with your doctor before changing the foods you eat. Your doctor may tell you to eat foods that have a lot of iron, such as: Liver. Low-fat (lean) beef. Breads and cereals that have iron added to them. Eggs. Dried fruit. Dark green, leafy vegetables. Eat fresh fruits and vegetables that are high in vitamin C. They help your body use iron. Foods with a lot of vitamin C include: Oranges. Peppers. Tomatoes. Mangoes. Managing constipation If you are taking iron pills, they may cause trouble pooping (constipation). To prevent or treat this, you may need to: Drink enough fluid to keep your pee (urine) pale yellow. Take over-the-counter or prescription medicines. Eat foods that are high in fiber. These include beans, whole grains, and fresh fruits and vegetables. Limit foods that are high in fat and sugar. These include fried or sweet foods. General instructions Return to your normal activities when your doctor says that it is safe. Keep all follow-up visits. Contact a doctor if: You feel like you may vomit (nauseous), or you vomit. You feel weak. You get light-headed when getting up from sitting or lying down. You are sweating for no reason. You have trouble pooping. You have worse breathing with physical activity. You have heaviness in your chest. Get help right away if: You faint. If this happens, do not drive yourself   to the hospital. You have a fast heartbeat, or a heartbeat that does not feel regular. Summary Iron deficiency anemia happens when you have too little iron in your body. This condition is treated by finding out why you do not have enough iron in your body and then getting more iron. Take over-the-counter and prescription medicines only as told by your doctor. Eat fresh fruits and vegetables  that are high in vitamin C. Contact a doctor if you have trouble pooping or feel weak. This information is not intended to replace advice given to you by your health care provider. Make sure you discuss any questions you have with your health care provider. Document Revised: 08/01/2021 Document Reviewed: 08/01/2021 Elsevier Patient Education  2023 Elsevier Inc.  

## 2022-02-13 NOTE — Progress Notes (Signed)
Subjective:    Patient ID: Haley Trevino, female    DOB: May 02, 1941, 81 y.o.   MRN: [id number]   Chief Complaint: medical management of chronic issues     HPI:  Haley Trevino is a 81 y.o. who identifies as a female who was assigned female at birth.   Social history: Lives with: husband  Work history: retired   Scientist, forensic in today for follow up of the following chronic medical issues:  1. Primary hypertension No c/o chest pain,sob or headache. Does not check blood pressure at home. BP Readings from Last 3 Encounters:  02/12/22 (!) 129/54  01/29/22 (!) 138/54  12/06/21 (!) 127/41      2. Hyperlipidemia with target LDL less than 100 Has a very poor appetite. Is not able to do much exercise. Lab Results  Component Value Date   CHOL 192 08/16/2021   HDL 54 08/16/2021   LDLCALC 111 (H) 08/16/2021   TRIG 154 (H) 08/16/2021   CHOLHDL 3.6 08/16/2021   The ASCVD Risk score (Arnett DK, et al., 2019) failed to calculate for the following reasons:   The 2019 ASCVD risk score is only valid for ages 79 to 72   3. Irritable bowel syndrome with both constipation and diarrhea Seem to be under ggod control. [place] her diet helps  4. Gastroesophageal reflux disease without esophagitis Is on omeprazole daily and is doing well.  5. Acquired hypothyroidism No problems that she is aware of. Gets frequent iron infusions. Lab Results  Component Value Date   TSH 1.240 08/16/2021     6. Iron deficiency anemia due to chronic blood loss Is not currently taking any iron supplements. Lab Results  Component Value Date   HGB 11.3 (L) 01/17/2022     7. SCLERODERMA Use  to see Dr. Amil Amen every 6 months. No recent changes in plan of care. So patient is no longer seeing him.  8. Recurrent major depressive disorder, in full remission ([place]) Is only onklonopin as needed.    02/13/2022    2:12 PM 06/14/2021   12:59 PM 04/29/2021    2:06 PM  Depression screen PHQ 2/9   Decreased Interest 0 1 1  Down, Depressed, Hopeless 0 1 1  PHQ - 2 Score 0 2 2  Altered sleeping 0 1 1  Tired, decreased energy 0 2 1  Change in appetite 0 2 2  Feeling bad or failure about yourself  0 0 0  Trouble concentrating 0 0 0  Moving slowly or fidgety/restless 0 1 0  Suicidal thoughts 0 0 0  PHQ-9 Score 0 8 6  Difficult doing work/chores Not difficult at all Somewhat difficult Somewhat difficult      9. Age-related osteoporosis without current pathological fracture Last dexascan was done on 08/16/20. T score was -3.1. she is currently on prolia.  10. Raynauds Is on procardia and sildenafil and is doing welll right now.has sore places on fingers bil.  11. Weight loss Wt Readings from Last 3 Encounters:  02/13/22 103 lb (46.7 kg)  11/27/21 102 lb 9.6 oz (46.5 kg)  10/04/21 105 lb (47.6 kg)   BMI Readings from Last 3 Encounters:  02/13/22 18.84 kg/m  11/27/21 18.77 kg/m  10/04/21 19.20 kg/m      New complaints: - has been having SOB with exertion- saw cardiology 3- 4 years ago and she was fine. - all over body aches -shaking  Allergies  Allergen Reactions   Bactrim [Sulfamethoxazole-Trimethoprim] Nausea And Vomiting  Dilaudid [Hydromorphone Hcl] Anaphylaxis   Dilaudid [Hydromorphone] Anaphylaxis   Acyclovir And Related     Unknown reaction   Crestor [Rosuvastatin Calcium] Other (See Comments)    Muscle pain   Gabapentin Swelling    Swelling in feet, and in legs   Aleve [Naproxen Sodium] Rash   Lyrica [Pregabalin] Other (See Comments)    Swelling in feet, and in legs   Outpatient Encounter Medications as of 02/13/2022  Medication Sig   acetaminophen (TYLENOL) 500 MG tablet Take 1,000 mg by mouth every 6 (six) hours as needed for moderate pain or headache.   CALCIUM PO Take by mouth.   Cholecalciferol (VITAMIN D) 50 MCG (2000 UT) tablet Take 2,000 Units by mouth daily.   clonazePAM (KLONOPIN) 0.5 MG tablet Take 1 tablet (0.5 mg total) by mouth 2  (two) times daily as needed for anxiety.   ezetimibe (ZETIA) 10 MG tablet Take 1 tablet (10 mg total) by mouth daily.   fluconazole (DIFLUCAN) 150 MG tablet Take 150 mg by mouth once.   FLUoxetine (PROZAC) 40 MG capsule Take 1 capsule (40 mg total) by mouth daily.   fluticasone (FLONASE) 50 MCG/ACT nasal spray PLACE 2 SPRAYS INTO BOTH NOSTRILS DAILY   Multiple Vitamins-Minerals (PRESERVISION/LUTEIN PO) Take by mouth.   NIFEdipine (PROCARDIA XL/NIFEDICAL XL) 60 MG 24 hr tablet Take 1 tablet (60 mg total) by mouth daily.   omeprazole (PRILOSEC) 20 MG capsule TAKE 1 TABLET DAILY   PROLIA 60 MG/ML SOSY injection Inject 60 mg into the skin every 6 (six) months.   sildenafil (REVATIO) 20 MG tablet Take 1 tablet (20 mg total) by mouth 2 (two) times daily.   sulfamethoxazole-trimethoprim (BACTRIM DS) 800-160 MG tablet Take 1 tablet by mouth 2 (two) times daily.   SYNTHROID 75 MCG tablet TAKE (1) TABLET DAILY BE- FORE BREAKFAST.   No facility-administered encounter medications on file as of 02/13/2022.    Past Surgical History:  Procedure Laterality Date   BREAST ENHANCEMENT SURGERY  1975   BREAST IMPLANT REMOVAL Bilateral 04/23/2016   Procedure: REMOVALBILATERAL BREAST IMPLANTS;  Surgeon: Wallace Going, DO;  Location: [facility];  Service: Plastics;  Laterality: Bilateral;   CHOLECYSTECTOMY  1973   ESOPHAGOGASTRODUODENOSCOPY N/A 10/25/2020   Procedure: ESOPHAGOGASTRODUODENOSCOPY (EGD);  Surgeon: Clarene Essex, MD;  Location: [facility];  Service: Endoscopy;  Laterality: N/A;  enteroscopy   HAND SURGERY  08/2012; 11/2012   HOT HEMOSTASIS N/A 10/25/2020   Procedure: HOT HEMOSTASIS (ARGON PLASMA COAGULATION/BICAP);  Surgeon: Clarene Essex, MD;  Location: [facility];  Service: Endoscopy;  Laterality: N/A;   IR RADIOLOGIST EVAL & MGMT  07/27/2017   IR SACROPLASTY BILATERAL  07/31/2017   IR VERTEBROPLASTY CERV/THOR BX INC UNI/BIL INC/INJECT/IMAGING  03/13/2020   IR VERTEBROPLASTY EA ADDL  (T&LS) BX INC UNI/BIL INC INJECT/IMAGING  03/14/2020   MELANOMA EXCISION     at 31 yrs of age   ORIF HIP FRACTURE Right 02/22/2014   Procedure: OPEN REDUCTION INTERNAL FIXATION RIGHT HIP;  Surgeon: Sanjuana Kava, MD;  Location: [facility];  Service: Orthopedics;  Laterality: Right;   TOTAL ABDOMINAL HYSTERECTOMY  1974    Family History  Problem Relation Age of Onset   Pancreatic cancer Father    Raynaud syndrome Father    Cancer Father        pancreatic   Rashes / Skin problems Daughter        possibly scleraderma   Hip fracture Mother    Mental illness Mother  attempted suicide at 64 yo      Controlled substance contract: n/a     Review of Systems  Constitutional:  Negative for diaphoresis.  Eyes:  Negative for pain.  Respiratory:  Negative for shortness of breath.   Cardiovascular:  Negative for chest pain, palpitations and leg swelling.  Gastrointestinal:  Negative for abdominal pain.  Endocrine: Negative for polydipsia.  Skin:  Negative for rash.  Neurological:  Negative for dizziness, weakness and headaches.  Hematological:  Does not bruise/bleed easily.  All other systems reviewed and are negative.      Objective:   Physical Exam Vitals and nursing note reviewed.  Constitutional:      General: She is not in acute distress.    Appearance: Normal appearance. She is well-developed.  HENT:     Head: Normocephalic.     Right Ear: Tympanic membrane normal.     Left Ear: Tympanic membrane normal.     Nose: Nose normal.     Mouth/Throat:     Mouth: Mucous membranes are moist.  Eyes:     Pupils: Pupils are equal, round, and reactive to light.  Neck:     Vascular: No carotid bruit or JVD.  Cardiovascular:     Rate and Rhythm: Normal rate and regular rhythm.     Heart sounds: Normal heart sounds.  Pulmonary:     Effort: Pulmonary effort is normal. No respiratory distress.     Breath sounds: Normal breath sounds. No wheezing or rales.  Chest:     Chest wall:  No tenderness.  Abdominal:     General: Bowel sounds are normal. There is no distension or abdominal bruit.     Palpations: Abdomen is soft. There is no hepatomegaly, splenomegaly, mass or pulsatile mass.     Tenderness: There is no abdominal tenderness.  Musculoskeletal:        General: Normal range of motion.     Cervical back: Normal range of motion and neck supple.  Lymphadenopathy:     Cervical: No cervical adenopathy.  Skin:    General: Skin is warm and dry.     Comments: Lesions on distal phalanges of 1st and 2nd fingers bil- no drainage , no erythema  Neurological:     General: No focal deficit present.     Mental Status: She is alert and oriented to person, place, and time.     Cranial Nerves: No cranial nerve deficit.     Sensory: No sensory deficit.     Deep Tendon Reflexes: Reflexes are normal and symmetric.     Comments: Shakiness of bil hands  Psychiatric:        Behavior: Behavior normal.        Thought Content: Thought content normal.        Judgment: Judgment normal.    BP 135/61   Pulse 71   Temp 98.3 F (36.8 C) (Temporal)   Resp 20   Ht '5\' 2"'  (1.575 m)   Wt 103 lb (46.7 kg)   SpO2 96%   BMI 18.84 kg/m         Assessment & Plan:  ARRIYANA RODELL comes in today with chief complaint of Medical Management of Chronic Issues   Diagnosis and orders addressed:  1. Primary hypertension Low sodium diet - CBC with Differential/Platelet - CMP14+EGFR  2. Hyperlipidemia with target LDL less than 100 Lowfat diet - ezetimibe (ZETIA) 10 MG tablet; Take 1 tablet (10 mg total) by mouth daily.  Dispense: 90  tablet; Refill: 1 - Lipid panel  3. Irritable bowel syndrome with both constipation and diarrhea Watch diet to prevent flare up  4. Gastroesophageal reflux disease without esophagitis Avoid spicy foods Do not eat 2 hours prior to bedtime  - omeprazole (PRILOSEC) 20 MG capsule; TAKE 1 TABLET DAILY  Dispense: 90 capsule; Refill: 1  5. Acquired  hypothyroidism Labs oending - SYNTHROID 75 MCG tablet; TAKE (1) TABLET DAILY BE- FORE BREAKFAST.  Dispense: 90 tablet; Refill: 1 - Thyroid Panel With TSH  6. Iron deficiency anemia due to chronic blood loss Conitnue with iron infusions when needed  7. SCLERODERMA  8. Recurrent major depressive disorder, in full remission ([place]) Stress management - FLUoxetine (PROZAC) 40 MG capsule; Take 1 capsule (40 mg total) by mouth daily.  Dispense: 90 capsule; Refill: 1  9. Age-related osteoporosis without current pathological fracture Weight bearing exercise when can tolerate  10. Weight loss Increase calories  11. [name] without gangrene Keepfinger warm - NIFEdipine (PROCARDIA XL/NIFEDICAL XL) 60 MG 24 hr tablet; Take 1 tablet (60 mg total) by mouth daily.  Dispense: 90 tablet; Refill: 1 - sildenafil (REVATIO) 20 MG tablet; Take 1 tablet (20 mg total) by mouth 2 (two) times daily.  Dispense: 180 tablet; Refill: 1  12. Shortness of breath Referral to cardiology - Ambulatory referral to Cardiology - Vitamin B12  13. Anxiety Stress management - clonazePAM (KLONOPIN) 0.5 MG tablet; Take 1 tablet (0.5 mg total) by mouth 2 (two) times daily as needed for anxiety.  Dispense: 180 tablet; Refill: 1   Labs pending Health Maintenance reviewed Diet and exercise encouraged  Follow up plan: 6 months   Mary-Margaret Hassell Done, FNP

## 2022-02-14 LAB — CBC WITH DIFFERENTIAL/PLATELET
Basophils Absolute: 0 10*3/uL (ref 0.0–0.2)
Basos: 1 %
EOS (ABSOLUTE): 0.1 10*3/uL (ref 0.0–0.4)
Eos: 2 %
Hematocrit: 31.3 % — ABNORMAL LOW (ref 34.0–46.6)
Hemoglobin: 9.6 g/dL — ABNORMAL LOW (ref 11.1–15.9)
Immature Grans (Abs): 0 10*3/uL (ref 0.0–0.1)
Immature Granulocytes: 0 %
Lymphocytes Absolute: 1.2 10*3/uL (ref 0.7–3.1)
Lymphs: 15 %
MCH: 29.2 pg (ref 26.6–33.0)
MCHC: 30.7 g/dL — ABNORMAL LOW (ref 31.5–35.7)
MCV: 95 fL (ref 79–97)
Monocytes Absolute: 0.5 10*3/uL (ref 0.1–0.9)
Monocytes: 6 %
Neutrophils Absolute: 5.9 10*3/uL (ref 1.4–7.0)
Neutrophils: 76 %
Platelets: 288 10*3/uL (ref 150–450)
RBC: 3.29 x10E6/uL — ABNORMAL LOW (ref 3.77–5.28)
RDW: 13.6 % (ref 11.7–15.4)
WBC: 7.8 10*3/uL (ref 3.4–10.8)

## 2022-02-14 LAB — THYROID PANEL WITH TSH
Free Thyroxine Index: 2.1 (ref 1.2–4.9)
T3 Uptake Ratio: 26 % (ref 24–39)
T4, Total: 7.9 ug/dL (ref 4.5–12.0)
TSH: 1.46 u[IU]/mL (ref 0.450–4.500)

## 2022-02-14 LAB — CMP14+EGFR
ALT: 9 IU/L (ref 0–32)
AST: 15 IU/L (ref 0–40)
Albumin/Globulin Ratio: 1.8 (ref 1.2–2.2)
Albumin: 4.3 g/dL (ref 3.8–4.8)
Alkaline Phosphatase: 50 IU/L (ref 44–121)
BUN/Creatinine Ratio: 18 (ref 12–28)
BUN: 13 mg/dL (ref 8–27)
Bilirubin Total: <0.2 mg/dL (ref 0.0–1.2)
CO2: 21 mmol/L (ref 20–29)
Calcium: 8.6 mg/dL — ABNORMAL LOW (ref 8.7–10.3)
Chloride: 105 mmol/L (ref 96–106)
Creatinine, Ser: 0.72 mg/dL (ref 0.57–1.00)
Globulin, Total: 2.4 g/dL (ref 1.5–4.5)
Glucose: 94 mg/dL (ref 70–99)
Potassium: 4.4 mmol/L (ref 3.5–5.2)
Sodium: 139 mmol/L (ref 134–144)
Total Protein: 6.7 g/dL (ref 6.0–8.5)
eGFR: 84 mL/min/{1.73_m2} (ref >59)

## 2022-02-14 LAB — LIPID PANEL
Chol/HDL Ratio: 2.9 ratio (ref 0.0–4.4)
Cholesterol, Total: 146 mg/dL (ref 100–199)
HDL: 50 mg/dL (ref >39)
LDL Chol Calc (NIH): 71 mg/dL (ref 0–99)
Triglycerides: 144 mg/dL (ref 0–149)
VLDL Cholesterol Cal: 25 mg/dL (ref 5–40)

## 2022-02-14 LAB — VITAMIN B12: Vitamin B-12: 577 pg/mL (ref 232–1245)

## 2022-02-21 ENCOUNTER — Inpatient Hospital Stay: Payer: Medicare Other

## 2022-02-21 ENCOUNTER — Other Ambulatory Visit: Payer: Self-pay | Admitting: Physician Assistant

## 2022-02-21 VITALS — BP 150/55 | HR 70 | Temp 97.7°F | Resp 18

## 2022-02-21 DIAGNOSIS — D500 Iron deficiency anemia secondary to blood loss (chronic): Secondary | ICD-10-CM

## 2022-02-21 DIAGNOSIS — K922 Gastrointestinal hemorrhage, unspecified: Secondary | ICD-10-CM | POA: Diagnosis not present

## 2022-02-21 MED ORDER — SODIUM CHLORIDE 0.9 % IV SOLN: Freq: Once | INTRAVENOUS | Status: AC

## 2022-02-21 MED ORDER — ACETAMINOPHEN 325 MG PO TABS
650.0000 mg | ORAL_TABLET | Freq: Once | ORAL | Status: AC
  Administered 2022-02-21: 650 mg via ORAL
  Filled 2022-02-21 (×2): qty 2

## 2022-02-21 MED ORDER — LORATADINE 10 MG PO TABS
10.0000 mg | ORAL_TABLET | Freq: Once | ORAL | Status: AC
  Administered 2022-02-21: 10 mg via ORAL
  Filled 2022-02-21 (×2): qty 1

## 2022-02-21 MED ORDER — SODIUM CHLORIDE 0.9 % IV SOLN
300.0000 mg | Freq: Once | INTRAVENOUS | Status: AC
  Administered 2022-02-21: 300 mg via INTRAVENOUS
  Filled 2022-02-21: qty 300

## 2022-02-21 NOTE — Progress Notes (Signed)
Patient presents today for Venofer infusion per providers order.  Vital signs WNL.   ? ?Peripheral IV started and blood return noted pre and post infusion.   ? ?Venofer given today per MD orders.  Stable during infusion without adverse affects.  Vital signs stable.  No complaints at this time.  Discharge from clinic ambulatory in stable condition.  Alert and oriented X 3.  Follow up with [facility] as scheduled.  ?

## 2022-02-27 ENCOUNTER — Inpatient Hospital Stay: Payer: Medicare Other

## 2022-03-13 ENCOUNTER — Other Ambulatory Visit: Payer: Self-pay | Admitting: *Deleted

## 2022-03-13 ENCOUNTER — Telehealth: Payer: Self-pay | Admitting: *Deleted

## 2022-03-13 DIAGNOSIS — K921 Melena: Secondary | ICD-10-CM

## 2022-03-13 DIAGNOSIS — D500 Iron deficiency anemia secondary to blood loss (chronic): Secondary | ICD-10-CM

## 2022-03-13 DIAGNOSIS — R072 Precordial pain: Secondary | ICD-10-CM | POA: Insufficient documentation

## 2022-03-13 NOTE — Progress Notes (Signed)
Cardiology Office Note   Date:  03/14/2022   ID:  Haley Trevino, DOB 02/10/1941, MRN [id number]  PCP:  Haley Pretty, FNP  Cardiologist:   None Referring:  Haley Pretty, FNP  Chief Complaint  Patient presents with   Shortness of Breath      History of Present Illness: Haley Trevino is a 81 y.o. female who is referred by  for evaluation of SOB.  I saw her in 2019.  She had mild AS on echo.  She had chest pain and a negative Lexiscan Myoview.  She has been chronically anemic and getting iron infusions and a few transfusions.  She is followed for this actually had blood work today.  She is still mildly anemic.  Iron and ferritin were low normal today.  She has been having progressive shortness of breath.  She gets short of breath climbing up her split-level steps.  She has to rest and recover.  She pushes a vacuum for half her room and she has to stop.  This is been slowly progressive.  She is under a lot of stress because her husband had a stroke and has some memory disorder.  She has not had any further evaluation since her echo.  She was sent here because of the dyspnea and because a murmur is louder.  She has been losing weight because she does not eat very much.  She is not describing PND or orthopnea.  She has some chest discomfort under both breasts but this is to palpation and some midsternal or lower sternal discomfort to palpation.  She thinks this is related to previous breast implants she had.  She is not describing substernal chest discomfort, neck or jaw discomfort.  She is not having the arm discomfort.  She has not had any palpitations, presyncope or syncope.  She has had no edema.   Past Medical History:  Diagnosis Date   Cataract    Depression    GERD (gastroesophageal reflux disease)    Hyperlipidemia    Hypertension    Hypothyroidism    IBS (irritable bowel syndrome)    Osteoarthritis    Raynaud disease    Scleroderma ([place])    Thyroid  disease    hypothyroidism    Past Surgical History:  Procedure Laterality Date   BREAST ENHANCEMENT SURGERY  1975   BREAST IMPLANT REMOVAL Bilateral 04/23/2016   Procedure: REMOVALBILATERAL BREAST IMPLANTS;  Surgeon: Haley Trevino;  Location: [facility];  Service: Plastics;  Laterality: Bilateral;   CHOLECYSTECTOMY  1973   ESOPHAGOGASTRODUODENOSCOPY N/A 10/25/2020   Procedure: ESOPHAGOGASTRODUODENOSCOPY (EGD);  Surgeon: Haley Trevino;  Location: [facility];  Service: Endoscopy;  Laterality: N/A;  enteroscopy   HAND SURGERY  08/2012; 11/2012   HOT HEMOSTASIS N/A 10/25/2020   Procedure: HOT HEMOSTASIS (ARGON PLASMA COAGULATION/BICAP);  Surgeon: Haley Trevino;  Location: [facility];  Service: Endoscopy;  Laterality: N/A;   IR RADIOLOGIST EVAL & MGMT  07/27/2017   IR SACROPLASTY BILATERAL  07/31/2017   IR VERTEBROPLASTY CERV/THOR BX INC UNI/BIL INC/INJECT/IMAGING  03/13/2020   IR VERTEBROPLASTY EA ADDL (T&LS) BX INC UNI/BIL INC INJECT/IMAGING  03/14/2020   MELANOMA EXCISION     at 31 yrs of age   ORIF HIP FRACTURE Right 02/22/2014   Procedure: OPEN REDUCTION INTERNAL FIXATION RIGHT HIP;  Surgeon: Haley Trevino;  Location: [facility];  Service: Orthopedics;  Laterality: Right;   TOTAL ABDOMINAL HYSTERECTOMY  1974     Current Outpatient  Medications  Medication Sig Dispense Refill   acetaminophen (TYLENOL) 500 MG tablet Take 1,000 mg by mouth every 6 (six) hours as needed for moderate pain or headache.     CALCIUM PO Take by mouth.     Cholecalciferol (VITAMIN D) 50 MCG (2000 UT) tablet Take 2,000 Units by mouth daily.     clonazePAM (KLONOPIN) 0.5 MG tablet Take 1 tablet (0.5 mg total) by mouth 2 (two) times daily as needed for anxiety. 180 tablet 1   ezetimibe (ZETIA) 10 MG tablet Take 1 tablet (10 mg total) by mouth daily. 90 tablet 1   FLUoxetine (PROZAC) 40 MG capsule Take 1 capsule (40 mg total) by mouth daily. 90 capsule 1   fluticasone (FLONASE) 50 MCG/ACT  nasal spray PLACE 2 SPRAYS INTO BOTH NOSTRILS DAILY 16 g 6   Multiple Vitamins-Minerals (PRESERVISION/LUTEIN PO) Take by mouth.     NIFEdipine (PROCARDIA XL/NIFEDICAL XL) 60 MG 24 hr tablet Take 1 tablet (60 mg total) by mouth daily. 90 tablet 1   omeprazole (PRILOSEC) 20 MG capsule TAKE 1 TABLET DAILY 90 capsule 1   PROLIA 60 MG/ML SOSY injection Inject 60 mg into the skin every 6 (six) months. 180 mL 0   sildenafil (REVATIO) 20 MG tablet Take 1 tablet (20 mg total) by mouth 2 (two) times daily. 180 tablet 1   SYNTHROID 75 MCG tablet TAKE (1) TABLET DAILY BE- FORE BREAKFAST. 90 tablet 1   doxycycline (VIBRA-TABS) 100 MG tablet Take 1 tablet (100 mg total) by mouth 2 (two) times daily. 1 po bid (Patient not taking: Reported on 03/14/2022) 20 tablet 0   No current facility-administered medications for this visit.    Allergies:   Bactrim [sulfamethoxazole-trimethoprim], Dilaudid [hydromorphone hcl], Dilaudid [hydromorphone], Acyclovir and related, Crestor [rosuvastatin calcium], Gabapentin, Aleve [naproxen sodium], and Lyrica [pregabalin]    Social History:  The patient  reports that she has never smoked. She has never used smokeless tobacco. She reports that she does not drink alcohol and does not use drugs.   Family History:  The patient's family history includes Cancer in her father; Hip fracture in her mother; Mental illness in her mother; Pancreatic cancer in her father; Rashes / Skin problems in her daughter; Raynaud syndrome in her father.    ROS:  Please see the history of present illness.   Otherwise, review of systems are positive for possible hemorrhoidal rectal bleeding, vertigo, weakness, decreased appetite.   All other systems are reviewed and negative.    PHYSICAL EXAM: VS:  BP (!) 114/54 (BP Location: Left Arm, Patient Position: Sitting, Cuff Size: Normal)   Pulse 74   Ht '5\' 2"'$  (1.575 m)   Wt 99 lb (44.9 kg)   BMI 18.11 kg/m  , BMI Body mass index is 18.11 kg/m. GENERAL:   Well appearing HEENT:  Pupils equal round and reactive, fundi not visualized, oral mucosa unremarkable NECK:  No jugular venous distention, waveform within normal limits, carotid upstroke brisk and symmetric, no bruits, no thyromegaly LYMPHATICS:  No cervical, inguinal adenopathy LUNGS:  Clear to auscultation bilaterally BACK:  No CVA tenderness CHEST:  Unremarkable HEART:  PMI not displaced or sustained,S1 and S2 within normal limits, no S3, no S4, no clicks, no rubs, 3 out of 6 apical systolic murmur mid to late peaking radiating at the aortic outflow tract, no diastolic murmurs ABD:  Flat, positive bowel sounds normal in frequency in pitch, no bruits, no rebound, no guarding, no midline pulsatile mass, no hepatomegaly, no splenomegaly  EXT:  2 plus pulses throughout, no edema, no cyanosis no clubbing.  Psoriatic arthritic changes SKIN:  No rashes no nodules NEURO:  Cranial nerves II through XII grossly intact, motor grossly intact throughout PSYCH:  Cognitively intact, oriented to person place and time    EKG:  EKG is ordered today. The ekg ordered today demonstrates sinus rhythm, rate 74, axis within normal limits, intervals within normal limits, no acute ST-T wave changes.   Recent Labs: 02/13/2022: ALT 9; BUN 13; Creatinine, Ser 0.72; Potassium 4.4; Sodium 139; TSH 1.460 03/14/2022: Hemoglobin 9.6; Platelets 248    Lipid Panel    Component Value Date/Time   CHOL 146 02/13/2022 1445   TRIG 144 02/13/2022 1445   TRIG 176 (H) 09/04/2014 1419   HDL 50 02/13/2022 1445   HDL 44 09/04/2014 1419   CHOLHDL 2.9 02/13/2022 1445   LDLCALC 71 02/13/2022 1445   LDLCALC 92 11/29/2013 1718      Wt Readings from Last 3 Encounters:  03/14/22 99 lb (44.9 kg)  02/13/22 103 lb (46.7 kg)  11/27/21 102 lb 9.6 oz (46.5 kg)      Other studies Reviewed: Additional studies/ records that were reviewed today include: Labs, previous echo and [place]. Review of the above records demonstrates:   Please see elsewhere in the note.     ASSESSMENT AND PLAN:  Chest pain: This is not the main complaint.  It is atypical and I Trevino not think anginal.  She thinks it is reproducible with palpation is probably is musculoskeletal.  AS: This was mild on echo few years ago.  She will get repeat echocardiography.  I suspect she has moderate at least aortic stenosis.  SOB: I will look at the results of the echocardiogram.  I might consider BNP.  Some of her symptoms are clearly related to the anemia.  HTN:   Of note she has had mild non obstructive renal artery stenosis.   Blood pressure is well controlled.  No change in medicines.    Current medicines are reviewed at length with the patient today.  The patient does not have concerns regarding medicines.  The following changes have been made:  no change  Labs/ tests ordered today include:   Orders Placed This Encounter  Procedures   ECHOCARDIOGRAM COMPLETE     Disposition:   FU with me in 6 months or sooner based on the results of the above.  I will try to see her in [place].   Signed, [name], Trevino  03/14/2022 2:40 PM    [facility]

## 2022-03-13 NOTE — Telephone Encounter (Signed)
Patent called concerned regarding seeing blood in her stool and aggravated hemorrhoids.  States stool also looks dark.  Will bring her in for labs tomorrow.  Patient aware to go to the ER if she develops SOB, increased blood loss, Dizziness.  Advised to make her GI physician aware as well.

## 2022-03-14 ENCOUNTER — Inpatient Hospital Stay: Payer: Medicare Other | Attending: Hematology | Admitting: Physician Assistant

## 2022-03-14 ENCOUNTER — Other Ambulatory Visit: Payer: Self-pay | Admitting: Physician Assistant

## 2022-03-14 ENCOUNTER — Ambulatory Visit (INDEPENDENT_AMBULATORY_CARE_PROVIDER_SITE_OTHER): Payer: Medicare Other | Admitting: Cardiology

## 2022-03-14 ENCOUNTER — Encounter: Payer: Self-pay | Admitting: Cardiology

## 2022-03-14 VITALS — BP 114/54 | HR 74 | Ht 62.0 in | Wt 99.0 lb

## 2022-03-14 DIAGNOSIS — I350 Nonrheumatic aortic (valve) stenosis: Secondary | ICD-10-CM | POA: Insufficient documentation

## 2022-03-14 DIAGNOSIS — R072 Precordial pain: Secondary | ICD-10-CM | POA: Diagnosis not present

## 2022-03-14 DIAGNOSIS — K922 Gastrointestinal hemorrhage, unspecified: Secondary | ICD-10-CM | POA: Insufficient documentation

## 2022-03-14 DIAGNOSIS — E785 Hyperlipidemia, unspecified: Secondary | ICD-10-CM

## 2022-03-14 DIAGNOSIS — I10 Essential (primary) hypertension: Secondary | ICD-10-CM | POA: Insufficient documentation

## 2022-03-14 DIAGNOSIS — R0602 Shortness of breath: Secondary | ICD-10-CM | POA: Insufficient documentation

## 2022-03-14 DIAGNOSIS — K921 Melena: Secondary | ICD-10-CM

## 2022-03-14 DIAGNOSIS — D500 Iron deficiency anemia secondary to blood loss (chronic): Secondary | ICD-10-CM | POA: Insufficient documentation

## 2022-03-14 DIAGNOSIS — R079 Chest pain, unspecified: Secondary | ICD-10-CM | POA: Diagnosis not present

## 2022-03-14 LAB — CBC WITH DIFFERENTIAL/PLATELET
Abs Immature Granulocytes: 0.02 10*3/uL (ref 0.00–0.07)
Basophils Absolute: 0.1 10*3/uL (ref 0.0–0.1)
Basophils Relative: 1 %
Eosinophils Absolute: 0.2 10*3/uL (ref 0.0–0.5)
Eosinophils Relative: 3 %
HCT: 27.5 % — ABNORMAL LOW (ref 36.0–46.0)
Hemoglobin: 9.6 g/dL — ABNORMAL LOW (ref 12.0–15.0)
Immature Granulocytes: 0 %
Lymphocytes Relative: 14 %
Lymphs Abs: 0.9 10*3/uL (ref 0.7–4.0)
MCH: 35.6 pg — ABNORMAL HIGH (ref 26.0–34.0)
MCHC: 34.9 g/dL (ref 30.0–36.0)
MCV: 101.9 fL — ABNORMAL HIGH (ref 80.0–100.0)
Monocytes Absolute: 0.4 10*3/uL (ref 0.1–1.0)
Monocytes Relative: 6 %
Neutro Abs: 4.8 10*3/uL (ref 1.7–7.7)
Neutrophils Relative %: 76 %
Platelets: 248 10*3/uL (ref 150–400)
RBC: 2.7 MIL/uL — ABNORMAL LOW (ref 3.87–5.11)
RDW: 17 % — ABNORMAL HIGH (ref 11.5–15.5)
WBC: 6.3 10*3/uL (ref 4.0–10.5)
nRBC: 0 % (ref 0.0–0.2)

## 2022-03-14 LAB — FERRITIN: Ferritin: 61 ng/mL (ref 11–307)

## 2022-03-14 LAB — IRON AND TIBC
Iron: 49 ug/dL (ref 28–170)
Saturation Ratios: 14 % (ref 10.4–31.8)
TIBC: 362 ug/dL (ref 250–450)
UIBC: 313 ug/dL

## 2022-03-14 LAB — SAMPLE TO BLOOD BANK

## 2022-03-14 NOTE — Patient Instructions (Signed)
Medication Instructions:  Your physician recommends that you continue on your current medications as directed. Please refer to the Current Medication list given to you today.  *If you need a refill on your cardiac medications before your next appointment, please call your pharmacy*  Testing/Procedures: Your physician has requested that you have an echocardiogram. Echocardiography is a painless test that uses sound waves to create images of your heart. It provides your doctor with information about the size and shape of your heart and how well your heart's chambers and valves are working. This procedure takes approximately one hour. There are no restrictions for this procedure.  Follow-Up: At [facility], you and your health needs are our priority.  As part of our continuing mission to provide you with exceptional heart care, we have created designated Provider Care Teams.  These Care Teams include your primary Cardiologist (physician) and Advanced Practice Providers (APPs -  Physician Assistants and Nurse Practitioners) who all work together to provide you with the care you need, when you need it.  We recommend signing up for the patient portal called "MyChart".  Sign up information is provided on this After Visit Summary.  MyChart is used to connect with patients for Virtual Visits (Telemedicine).  Patients are able to view lab/test results, encounter notes, upcoming appointments, etc.  Non-urgent messages can be sent to your provider as well.   To learn more about what you can do with MyChart, go to [email or web address].    Your next appointment:   6 month(s)  The format for your next appointment:   In Person  Provider:   [name], MD      Important Information About Sugar

## 2022-03-14 NOTE — Progress Notes (Signed)
NURSES:  Please call patient to let her know that her iron is low.  Blood is low as well, but not low enough that she needs a transfusion.  We will schedule her for IV iron.  If she is continuing to have bleeding, she needs to reach out to her GI doctor ASAP.

## 2022-03-14 NOTE — Progress Notes (Signed)
Left detailed message making patient aware of above on patient's answering machine per chart.

## 2022-03-17 ENCOUNTER — Encounter: Payer: Self-pay | Admitting: Hematology

## 2022-03-18 ENCOUNTER — Inpatient Hospital Stay: Payer: Medicare Other

## 2022-03-18 VITALS — BP 115/50 | HR 74 | Temp 97.1°F | Resp 18

## 2022-03-18 DIAGNOSIS — R0602 Shortness of breath: Secondary | ICD-10-CM | POA: Diagnosis not present

## 2022-03-18 DIAGNOSIS — R079 Chest pain, unspecified: Secondary | ICD-10-CM | POA: Diagnosis not present

## 2022-03-18 DIAGNOSIS — D500 Iron deficiency anemia secondary to blood loss (chronic): Secondary | ICD-10-CM

## 2022-03-18 DIAGNOSIS — I10 Essential (primary) hypertension: Secondary | ICD-10-CM | POA: Diagnosis not present

## 2022-03-18 DIAGNOSIS — K922 Gastrointestinal hemorrhage, unspecified: Secondary | ICD-10-CM | POA: Diagnosis not present

## 2022-03-18 MED ORDER — SODIUM CHLORIDE 0.9 % IV SOLN
300.0000 mg | Freq: Once | INTRAVENOUS | Status: AC
  Administered 2022-03-18: 300 mg via INTRAVENOUS
  Filled 2022-03-18: qty 300

## 2022-03-18 MED ORDER — LORATADINE 10 MG PO TABS
10.0000 mg | ORAL_TABLET | Freq: Once | ORAL | Status: AC
  Administered 2022-03-18: 10 mg via ORAL
  Filled 2022-03-18: qty 1

## 2022-03-18 MED ORDER — ACETAMINOPHEN 325 MG PO TABS
650.0000 mg | ORAL_TABLET | Freq: Once | ORAL | Status: AC
  Administered 2022-03-18: 650 mg via ORAL
  Filled 2022-03-18: qty 2

## 2022-03-18 MED ORDER — SODIUM CHLORIDE 0.9 % IV SOLN: Freq: Once | INTRAVENOUS | Status: AC

## 2022-03-18 NOTE — Progress Notes (Signed)
Patient presents today for Venofer 300 mg IV infusion. Vital signs stable. Patient has no complaints of any significant changes since her last visit. MAR reviewed and updated.   Venofer 300 mg given today per MD orders. Tolerated infusion without adverse affects. Vital signs stable. No complaints at this time. [name] Alert and oriented x 3. [name]

## 2022-03-18 NOTE — Patient Instructions (Signed)
[facility]  Discharge Instructions: Thank you for choosing  Cancer Center to provide your oncology and hematology care.  If you have a lab appointment with the Cancer Center, please come in thru the Main Entrance and check in at the main information desk.  Wear comfortable clothing and clothing appropriate for easy access to any Portacath or PICC line.   We strive to give you quality time with your provider. You may need to reschedule your appointment if you arrive late (15 or more minutes).  Arriving late affects you and other patients whose appointments are after yours.  Also, if you miss three or more appointments without notifying the office, you may be dismissed from the clinic at the provider's discretion.      For prescription refill requests, have your pharmacy contact our office and allow 72 hours for refills to be completed.    Today you received the following chemotherapy and/or immunotherapy agents Venofer 300 mg  Iron Sucrose Injection What is this medication? IRON SUCROSE (EYE ern SOO krose) treats low levels of iron (iron deficiency anemia) in people with kidney disease. Iron is a mineral that plays an important role in making red blood cells, which carry oxygen from your lungs to the rest of your body. This medicine may be used for other purposes; ask your health care provider or pharmacist if you have questions. COMMON BRAND NAME(S): Venofer What should I tell my care team before I take this medication? They need to know if you have any of these conditions: Anemia not caused by low iron levels Heart disease High levels of iron in the blood Kidney disease Liver disease An unusual or allergic reaction to iron, other medications, foods, dyes, or preservatives Pregnant or trying to get pregnant Breast-feeding How should I use this medication? This medication is for infusion into a vein. It is given in a hospital or clinic setting. Talk to  your care team about the use of this medication in children. While this medication may be prescribed for children as young as 2 years for selected conditions, precautions do apply. Overdosage: If you think you have taken too much of this medicine contact a poison control center or emergency room at once. NOTE: This medicine is only for you. Do not share this medicine with others. What if I miss a dose? It is important not to miss your dose. Call your care team if you are unable to keep an appointment. What may interact with this medication? Do not take this medication with any of the following: Deferoxamine Dimercaprol Other iron products This medication may also interact with the following: Chloramphenicol Deferasirox This list may not describe all possible interactions. Give your health care provider a list of all the medicines, herbs, non-prescription drugs, or dietary supplements you use. Also tell them if you smoke, drink alcohol, or use illegal drugs. Some items may interact with your medicine. What should I watch for while using this medication? Visit your care team regularly. Tell your care team if your symptoms do not start to get better or if they get worse. You may need blood work done while you are taking this medication. You may need to follow a special diet. Talk to your care team. Foods that contain iron include: whole grains/cereals, dried fruits, beans, or peas, leafy green vegetables, and organ meats (liver, kidney). What side effects may I notice from receiving this medication? Side effects that you should report to your care team as   soon as possible: Allergic reactions--skin rash, itching, hives, swelling of the face, lips, tongue, or throat Low blood pressure--dizziness, feeling faint or lightheaded, blurry vision Shortness of breath Side effects that usually do not require medical attention (report to your care team if they continue or are  bothersome): Flushing Headache Joint pain Muscle pain Nausea Pain, redness, or irritation at injection site This list may not describe all possible side effects. Call your doctor for medical advice about side effects. You may report side effects to FDA at 1-800-FDA-1088. Where should I keep my medication? This medication is given in a hospital or clinic and will not be stored at home. NOTE: This sheet is a summary. It may not cover all possible information. If you have questions about this medicine, talk to your doctor, pharmacist, or health care provider.  2023 Elsevier/Gold Standard (2007-08-14 00:00:00)       To help prevent nausea and vomiting after your treatment, we encourage you to take your nausea medication as directed.  BELOW ARE SYMPTOMS THAT SHOULD BE REPORTED IMMEDIATELY: *FEVER GREATER THAN 100.4 F (38 C) OR HIGHER *CHILLS OR SWEATING *NAUSEA AND VOMITING THAT IS NOT CONTROLLED WITH YOUR NAUSEA MEDICATION *UNUSUAL SHORTNESS OF BREATH *UNUSUAL BRUISING OR BLEEDING *URINARY PROBLEMS (pain or burning when urinating, or frequent urination) *BOWEL PROBLEMS (unusual diarrhea, constipation, pain near the anus) TENDERNESS IN MOUTH AND THROAT WITH OR WITHOUT PRESENCE OF ULCERS (sore throat, sores in mouth, or a toothache) UNUSUAL RASH, SWELLING OR PAIN  UNUSUAL VAGINAL DISCHARGE OR ITCHING   Items with * indicate a potential emergency and should be followed up as soon as possible or go to the Emergency Department if any problems should occur.  Please show the CHEMOTHERAPY ALERT CARD or IMMUNOTHERAPY ALERT CARD at check-in to the Emergency Department and triage nurse.  Should you have questions after your visit or need to cancel or reschedule your appointment, please contact [facility] [phone]  and follow the prompts.  Office hours are 8:00 a.m. to 4:30 p.m. Monday - Friday. Please note that voicemails left after 4:00 p.m. may not be returned until  the following business day.  We are closed weekends and major holidays. You have access to a nurse at all times for urgent questions. Please call the main number to the clinic [phone] and follow the prompts.  For any non-urgent questions, you may also contact your provider using MyChart. We now offer e-Visits for anyone 18 and older to request care online for non-urgent symptoms. For details visit [email or web address].   Also download the MyChart app! Go to the app store, search "MyChart", open the app, select [place], and log in with your MyChart username and password.  Masks are optional in the cancer centers. If you would like for your care team to wear a mask while they are taking care of you, please let them know. You may have one support person who is at least 80 years old accompany you for your appointments.  

## 2022-03-20 ENCOUNTER — Other Ambulatory Visit: Payer: Medicare Other

## 2022-03-20 ENCOUNTER — Ambulatory Visit (HOSPITAL_COMMUNITY)
Admission: RE | Admit: 2022-03-20 | Discharge: 2022-03-20 | Disposition: A | Discharge status: Home / Self Care | Payer: Medicare Other | Source: Ambulatory Visit | Attending: Cardiology | Admitting: Cardiology

## 2022-03-20 DIAGNOSIS — I350 Nonrheumatic aortic (valve) stenosis: Secondary | ICD-10-CM | POA: Diagnosis not present

## 2022-03-20 LAB — ECHOCARDIOGRAM COMPLETE
AR max vel: 0.99 cm2
AV Area VTI: 1.12 cm2
AV Area mean vel: 1.01 cm2
AV Mean grad: 48.5 mmHg
AV Peak grad: 76.9 mmHg
Ao pk vel: 4.39 m/s
Area-P 1/2: 1.96 cm2
S' Lateral: 1.9 cm

## 2022-03-20 NOTE — Progress Notes (Signed)
*  PRELIMINARY RESULTS* Echocardiogram 2D Echocardiogram has been performed.  Haley Trevino 03/20/2022, 1:26 PM

## 2022-03-21 ENCOUNTER — Other Ambulatory Visit: Payer: Self-pay | Admitting: *Deleted

## 2022-03-21 DIAGNOSIS — I350 Nonrheumatic aortic (valve) stenosis: Secondary | ICD-10-CM

## 2022-03-25 ENCOUNTER — Inpatient Hospital Stay: Payer: Medicare Other

## 2022-03-25 VITALS — BP 113/37 | HR 60 | Temp 98.2°F | Resp 16

## 2022-03-25 DIAGNOSIS — R0602 Shortness of breath: Secondary | ICD-10-CM | POA: Diagnosis not present

## 2022-03-25 DIAGNOSIS — K922 Gastrointestinal hemorrhage, unspecified: Secondary | ICD-10-CM | POA: Diagnosis not present

## 2022-03-25 DIAGNOSIS — D500 Iron deficiency anemia secondary to blood loss (chronic): Secondary | ICD-10-CM | POA: Diagnosis not present

## 2022-03-25 DIAGNOSIS — I10 Essential (primary) hypertension: Secondary | ICD-10-CM | POA: Diagnosis not present

## 2022-03-25 DIAGNOSIS — R079 Chest pain, unspecified: Secondary | ICD-10-CM | POA: Diagnosis not present

## 2022-03-25 MED ORDER — SODIUM CHLORIDE 0.9 % IV SOLN
300.0000 mg | Freq: Once | INTRAVENOUS | Status: AC
  Administered 2022-03-25: 300 mg via INTRAVENOUS
  Filled 2022-03-25: qty 300

## 2022-03-25 MED ORDER — LORATADINE 10 MG PO TABS
10.0000 mg | ORAL_TABLET | Freq: Once | ORAL | Status: AC
  Administered 2022-03-25: 10 mg via ORAL
  Filled 2022-03-25: qty 1

## 2022-03-25 MED ORDER — ACETAMINOPHEN 325 MG PO TABS
650.0000 mg | ORAL_TABLET | Freq: Once | ORAL | Status: AC
  Administered 2022-03-25: 650 mg via ORAL
  Filled 2022-03-25: qty 2

## 2022-03-25 MED ORDER — SODIUM CHLORIDE 0.9 % IV SOLN: Freq: Once | INTRAVENOUS | Status: AC

## 2022-03-25 NOTE — Patient Instructions (Signed)
[facility] [place]  Discharge Instructions: Thank you for choosing [facility] to provide your oncology and hematology care.  If you have a lab appointment with the Cancer Center, please come in thru the Main Entrance and check in at the main information desk.  Wear comfortable clothing and clothing appropriate for easy access to any Portacath or PICC line.   We strive to give you quality time with your provider. You may need to reschedule your appointment if you arrive late (15 or more minutes).  Arriving late affects you and other patients whose appointments are after yours.  Also, if you miss three or more appointments without notifying the office, you may be dismissed from the clinic at the provider's discretion.      For prescription refill requests, have your pharmacy contact our office and allow 72 hours for refills to be completed.    Today you received the following chemotherapy and/or immunotherapy agents Venofer      To help prevent nausea and vomiting after your treatment, we encourage you to take your nausea medication as directed.  BELOW ARE SYMPTOMS THAT SHOULD BE REPORTED IMMEDIATELY: *FEVER GREATER THAN 100.4 F (38 C) OR HIGHER *CHILLS OR SWEATING *NAUSEA AND VOMITING THAT IS NOT CONTROLLED WITH YOUR NAUSEA MEDICATION *UNUSUAL SHORTNESS OF BREATH *UNUSUAL BRUISING OR BLEEDING *URINARY PROBLEMS (pain or burning when urinating, or frequent urination) *BOWEL PROBLEMS (unusual diarrhea, constipation, pain near the anus) TENDERNESS IN MOUTH AND THROAT WITH OR WITHOUT PRESENCE OF ULCERS (sore throat, sores in mouth, or a toothache) UNUSUAL RASH, SWELLING OR PAIN  UNUSUAL VAGINAL DISCHARGE OR ITCHING   Items with * indicate a potential emergency and should be followed up as soon as possible or go to the Emergency Department if any problems should occur.  Please show the CHEMOTHERAPY ALERT CARD or IMMUNOTHERAPY ALERT CARD at check-in to the Emergency  Department and triage nurse.  Should you have questions after your visit or need to cancel or reschedule your appointment, please contact [facility] [place] [phone]  and follow the prompts.  Office hours are 8:00 a.m. to 4:30 p.m. Monday - Friday. Please note that voicemails left after 4:00 p.m. may not be returned until the following business day.  We are closed weekends and major holidays. You have access to a nurse at all times for urgent questions. Please call the main number to the clinic [phone] and follow the prompts.  For any non-urgent questions, you may also contact your provider using MyChart. We now offer e-Visits for anyone 18 and older to request care online for non-urgent symptoms. For details visit [email or web address].   Also download the MyChart app! Go to the app store, search "MyChart", open the app, select [place], and log in with your MyChart username and password.  Masks are optional in the cancer centers. If you would like for your care team to wear a mask while they are taking care of you, please let them know. You may have one support person who is at least 80 years old accompany you for your appointments.  

## 2022-03-25 NOTE — Progress Notes (Signed)
Chaplain engaged in a follow-up visit with Haley Trevino, offering community and conversation.  Chaplain and Haley Trevino talked about various life happenings.  Haley Trevino shared about her recent healthcare journey concerning her heart and her desire to get things done soon.    03/25/22 1200  Clinical Encounter Type  Visited With Patient  Visit Type Follow-up

## 2022-03-25 NOTE — Progress Notes (Signed)
Patient ID: Haley Trevino MRN: [id number] DOB/AGE: Sep 16, 1940 81 y.o.  Primary Care Physician:Martin, Mary-Margaret, FNP Primary Cardiologist: Marijo File, MD   FOCUSED PROBLEM LIST:   1.  Moderate to severe aortic stenosis with an aortic valve area of 1.1 cm grade, mean gradient of 48 mmHg, and peak velocity 4.38 m/s; ejection fraction of 65 to 70%; no conduction abnormalities 2.  Moderate mitral stenosis due to MAC 3.  Iron deficiency anemia managed with intermittent iron infusions 4.  Hyperlipidemia 5.  Scleroderma and Raynaud's on chronic Revatio therapy  HISTORY OF PRESENT ILLNESS: The patient is a 81 y.o. female with the indicated medical history here for for recommendations regarding her symptomatic moderate to severe aortic stenosis.  The patient has been followed for some time and her most recent echocardiogram demonstrated development of moderate to severe aortic stenosis.  She reported increasing dyspnea on exertion.  It was noted that her murmur seemed much more prominent versus before.  The patient is here with her husband.  She tells me that she has had quite a bit of shortness of breath over the last 3 months.  She has had anemia for quite a while and has been treated with more than 20 iron infusions.  She tells me she feels better about 2 weeks after she gets an iron infusion.  She has noticed diminishing returns on every successive iron infusion she is received however.  She now reports New York Heart Association class III symptoms of dyspnea.  She occasionally gets chest discomfort.  She is unsure whether this however is due to scar tissue from previous breast implant removal.  She has been plagued by pain from the scar tissue for some time.  She is unable to do any of her activities of daily living and showering is quite difficult in terms of dyspnea.  She denies any presyncope or syncope.  She is able to lay flat.  She denies any significant peripheral edema.  She has  had no severe bleeding or bruising.  On review of her blood work her hemoglobin has been around 9-11 over the past several years.  She has been as low as around 6 few years ago.   She has both upper and lower dentures.    Past Surgical History:  Procedure Laterality Date   BREAST ENHANCEMENT SURGERY  1975   BREAST IMPLANT REMOVAL Bilateral 04/23/2016   Procedure: REMOVALBILATERAL BREAST IMPLANTS;  Surgeon: Wallace Going, DO;  Location: [facility];  Service: Plastics;  Laterality: Bilateral;   CHOLECYSTECTOMY  1973   ESOPHAGOGASTRODUODENOSCOPY N/A 10/25/2020   Procedure: ESOPHAGOGASTRODUODENOSCOPY (EGD);  Surgeon: Clarene Essex, MD;  Location: [facility];  Service: Endoscopy;  Laterality: N/A;  enteroscopy   HAND SURGERY  08/2012; 11/2012   HOT HEMOSTASIS N/A 10/25/2020   Procedure: HOT HEMOSTASIS (ARGON PLASMA COAGULATION/BICAP);  Surgeon: Clarene Essex, MD;  Location: [facility];  Service: Endoscopy;  Laterality: N/A;   IR RADIOLOGIST EVAL & MGMT  07/27/2017   IR SACROPLASTY BILATERAL  07/31/2017   IR VERTEBROPLASTY CERV/THOR BX INC UNI/BIL INC/INJECT/IMAGING  03/13/2020   IR VERTEBROPLASTY EA ADDL (T&LS) BX INC UNI/BIL INC INJECT/IMAGING  03/14/2020   MELANOMA EXCISION     at 31 yrs of age   ORIF HIP FRACTURE Right 02/22/2014   Procedure: OPEN REDUCTION INTERNAL FIXATION RIGHT HIP;  Surgeon: Sanjuana Kava, MD;  Location: [facility];  Service: Orthopedics;  Laterality: Right;   TOTAL ABDOMINAL HYSTERECTOMY  1974    Family History  Problem Relation Age of Onset   Pancreatic cancer Father    Raynaud syndrome Father    Cancer Father        pancreatic   Rashes / Skin problems Daughter        possibly scleraderma   Hip fracture Mother    Mental illness Mother        attempted suicide at 56 yo    Social History   Socioeconomic History   Marital status: Married    Spouse name: Not on file   Number of children: 2   Years of education: Not on file   Highest education  level: Some college, no degree  Occupational History   Occupation: retired Occupational psychologist at [employer]  Tobacco Use   Smoking status: Never   Smokeless tobacco: Never   Tobacco comments:    passive tobacco smoke exposure as child  Vaping Use   Vaping Use: Never used  Substance and Sexual Activity   Alcohol use: No   Drug use: No   Sexual activity: Not Currently    Birth control/protection: Surgical  Other Topics Concern   Not on file  Social History Narrative   Regular exercise: walkingCaffeine use: 1 cup of coffee daily   Lives in split level home with her husband   Social Determinants of Health   Financial Resource Strain: [place]  (04/29/2021)   Overall Financial Resource Strain (CARDIA)    Difficulty of Paying Living Expenses: Not hard at all  Food Insecurity: No Food Insecurity (04/29/2021)   Hunger Vital Sign    Worried About Running Out of Food in the Last Year: Never true    [place] in the Last Year: Never true  Transportation Needs: No Transportation Needs (04/29/2021)   PRAPARE - Hydrologist (Medical): No    Lack of Transportation (Non-Medical): No  Physical Activity: Inactive (06/14/2021)   Exercise Vital Sign    Days of Exercise per Week: 0 days    Minutes of Exercise per Session: 0 min  Stress: Stress Concern Present (06/14/2021)   [place]    Feeling of Stress : Rather much  Social Connections: Socially Integrated (04/29/2021)   Social Connection and Isolation Panel [NHANES]    Frequency of Communication with Friends and Family: More than three times a week    Frequency of Social Gatherings with Friends and Family: Once a week    Attends Religious Services: 1 to 4 times per year    Active Member of [employer] or Organizations: Yes    Attends Archivist Meetings: 1 to 4 times per year    Marital Status: Married  Human resources officer Violence: Not At  Risk (04/29/2021)   Humiliation, Afraid, Rape, and Kick questionnaire    Fear of Current or Ex-Partner: No    Emotionally Abused: No    Physically Abused: No    Sexually Abused: No     Prior to Admission medications   Medication Sig Start Date End Date Taking? Authorizing Provider  acetaminophen (TYLENOL) 500 MG tablet Take 1,000 mg by mouth every 6 (six) hours as needed for moderate pain or headache.    [provider]  CALCIUM PO Take by mouth.    [provider]  Cholecalciferol (VITAMIN D) 50 MCG (2000 UT) tablet Take 2,000 Units by mouth daily.    [provider]  clonazePAM (KLONOPIN) 0.5 MG tablet Take 1 tablet (0.5 mg  total) by mouth 2 (two) times daily as needed for anxiety. 02/13/22   Hassell Done, Mary-Margaret, FNP  doxycycline (VIBRA-TABS) 100 MG tablet Take 1 tablet (100 mg total) by mouth 2 (two) times daily. 1 po bid 02/13/22   Hassell Done, Mary-Margaret, FNP  ezetimibe (ZETIA) 10 MG tablet Take 1 tablet (10 mg total) by mouth daily. 02/13/22   Hassell Done, Mary-Margaret, FNP  FLUoxetine (PROZAC) 40 MG capsule Take 1 capsule (40 mg total) by mouth daily. 02/13/22   Hassell Done Mary-Margaret, FNP  fluticasone (FLONASE) 50 MCG/ACT nasal spray PLACE 2 SPRAYS INTO BOTH NOSTRILS DAILY 01/03/22   [name], FNP  Multiple Vitamins-Minerals (PRESERVISION/LUTEIN PO) Take by mouth.    [provider]  NIFEdipine (PROCARDIA XL/NIFEDICAL XL) 60 MG 24 hr tablet Take 1 tablet (60 mg total) by mouth daily. 02/13/22   Hassell Done Mary-Margaret, FNP  omeprazole (PRILOSEC) 20 MG capsule TAKE 1 TABLET DAILY 02/13/22   Hassell Done, Mary-Margaret, FNP  PROLIA 60 MG/ML SOSY injection Inject 60 mg into the skin every 6 (six) months. 10/16/21   Hassell Done, Mary-Margaret, FNP  sildenafil (REVATIO) 20 MG tablet Take 1 tablet (20 mg total) by mouth 2 (two) times daily. 02/13/22   Hassell Done, Mary-Margaret, FNP  SYNTHROID 75 MCG tablet TAKE (1) TABLET DAILY BE- FORE BREAKFAST. 02/13/22   [name], FNP    Allergies  Allergen Reactions   Bactrim [Sulfamethoxazole-Trimethoprim] Nausea And Vomiting   Dilaudid [Hydromorphone Hcl] Anaphylaxis   Dilaudid [Hydromorphone] Anaphylaxis   Acyclovir And Related     Unknown reaction   Crestor [Rosuvastatin Calcium] Other (See Comments)    Muscle pain   Gabapentin Swelling    Swelling in feet, and in legs   Aleve [Naproxen Sodium] Rash   Lyrica [Pregabalin] Other (See Comments)    Swelling in feet, and in legs    REVIEW OF SYSTEMS:  General: no fevers/chills/night sweats Eyes: no blurry vision, diplopia, or amaurosis ENT: no sore throat or hearing loss Resp: no cough, wheezing, or hemoptysis CV: no edema or palpitations GI: no abdominal pain, nausea, vomiting, diarrhea, or constipation GU: no dysuria, frequency, or hematuria Skin: no rash Neuro: no headache, numbness, tingling, or weakness of extremities Musculoskeletal: no joint pain or swelling Heme: no bleeding, DVT, or easy bruising Endo: no polydipsia or polyuria  BP 132/62   Pulse 74   Ht _0  (1.575 m)   Wt 100 lb 9.6 oz (45.6 kg)   BMI 18.40 kg/m   PHYSICAL EXAM: GEN:  AO x 3 in no acute distress HEENT: normal Dentition: Dentures Neck: JVP normal. +2 carotid upstrokes without bruits. No thyromegaly. Lungs: equal expansion, clear bilaterally CV: Apex is discrete and nondisplaced, RRR with 3 out of 6 systolic murmur Abd: soft, non-tender, non-distended; no bruit; positive bowel sounds Ext: no edema, ecchymoses, or cyanosis Vascular: 2+ femoral pulses, 2+ radial pulses       Skin: warm and dry without rash Neuro: CN II-XII grossly intact; motor and sensory grossly intact    DATA AND STUDIES:  EKG: Today's EKG demonstrates sinus rhythm with left ventricular hypertrophy with occasional PACs  2D ECHO: September 2023  1. The aortic valve is tricuspid. There is severe calcifcation of the  aortic valve. There is severe thickening of the aortic  valve. Aortic valve  regurgitation is trivial. Likely severe aortic valve stenosis with aortic  valve mean gradient of 48.5 mmHg  and Vmax 4.38 m/s. The patient is also hyperdynamic which contributes to  the elevated gradients. The aortic valve area  and DI calculate in the  moderate range with AVA 1.1cm2, DI 0.35 (LVOT VTI 35). Visually, however,  the valve appears severely stenosed.   2. Left ventricular ejection fraction, by estimation, is >75%. The left  ventricle has hyperdynamic function. The left ventricle has no regional  wall motion abnormalities. There is moderate concentric left ventricular  hypertrophy. Left ventricular  diastolic parameters are consistent with Grade II diastolic dysfunction  (pseudonormalization).   3. Right ventricular systolic function is normal. The right ventricular  size is normal. There is mildly elevated pulmonary artery systolic  pressure.   4. Left atrial size was severely dilated.   5. Right atrial size was mildly dilated.   6. The mitral valve is degenerative. Trivial mitral valve regurgitation.  Mild to moderate mitral stenosis. Severe mitral annular calcification. MVA  by PHT is 1.9cm2 and by continuity is 1.75cm2. Mean gradient is 72mHg at  HR 70bpm.   7. The inferior vena cava is normal in size with greater than 50%  respiratory variability, suggesting right atrial pressure of 3 mmHg.  CARDIAC CATH: n/a   STS RISK CALCULATOR: Pending  NHYA CLASS: 3    ASSESSMENT AND PLAN:   Aortic valve stenosis, etiology of cardiac valve disease unspecified - Plan: EKG 12-Lead, CBC, Basic metabolic panel  Iron deficiency anemia due to chronic blood loss  The patient has developed New York Heart Association class III symptoms of dyspnea.  She has competing reasons to be dyspneic including ongoing chronic anemia however she suffered quite an increase in her dyspnea over the past few months.  This is coincided with the detection of moderate to severe  aortic stenosis.  I reviewed her echocardiogram which demonstrates a severely calcified valve with a mean gradient of 48 mmHg consistent with severe aortic stenosis.  Her maximum velocity is over 4 m/s as well.  I think it makes sense to consider an aortic valve intervention in this patient given her lifestyle limiting dyspnea and to remove the contribution of her valvular disease to her symptom burden.  Certainly this would not address her ongoing anemia and she understands that even after successful intervention she may be somewhat dyspneic but hopefully improved versus how she feels right now.  For this reason I will refer her for a CT scan, coronary angiography and right heart catheterization study, and cardiothoracic surgical evaluation.  I have personally reviewed the patients imaging data as summarized above.  I have reviewed the natural history of aortic stenosis with the patient and family members who are present today. We have discussed the limitations of medical therapy and the poor prognosis associated with symptomatic aortic stenosis. We have also reviewed potential treatment options, including palliative medical therapy, conventional surgical aortic valve replacement, and transcatheter aortic valve replacement. We discussed treatment options in the context of this patient's specific comorbid medical conditions.   All of the patient's questions were answered today. Will make further recommendations based on the results of studies outlined above.   Total time spent with patient today 60 minutes. This includes reviewing records, evaluating the patient and coordinating care.   AEarly Osmond MD  03/28/2022 6:32 AM    [email or web address]  [id number]: [phone] Fax: [phone]

## 2022-03-25 NOTE — H&P (View-Only) (Signed)
Patient ID: AYONA YNIGUEZ MRN: [id number] DOB/AGE: 1940/10/17 81 y.o.  Primary Care Physician:Martin, Mary-Margaret, FNP Primary Cardiologist: Marijo File, MD   FOCUSED PROBLEM LIST:   1.  Moderate to severe aortic stenosis with an aortic valve area of 1.1 cm grade, mean gradient of 48 mmHg, and peak velocity 4.38 m/s; ejection fraction of 65 to 70%; no conduction abnormalities 2.  Moderate mitral stenosis due to MAC 3.  Iron deficiency anemia managed with intermittent iron infusions 4.  Hyperlipidemia 5.  Scleroderma and Raynaud's on chronic Revatio therapy  HISTORY OF PRESENT ILLNESS: The patient is a 81 y.o. female with the indicated medical history here for for recommendations regarding her symptomatic moderate to severe aortic stenosis.  The patient has been followed for some time and her most recent echocardiogram demonstrated development of moderate to severe aortic stenosis.  She reported increasing dyspnea on exertion.  It was noted that her murmur seemed much more prominent versus before.  The patient is here with her husband.  She tells me that she has had quite a bit of shortness of breath over the last 3 months.  She has had anemia for quite a while and has been treated with more than 20 iron infusions.  She tells me she feels better about 2 weeks after she gets an iron infusion.  She has noticed diminishing returns on every successive iron infusion she is received however.  She now reports New York Heart Association class III symptoms of dyspnea.  She occasionally gets chest discomfort.  She is unsure whether this however is due to scar tissue from previous breast implant removal.  She has been plagued by pain from the scar tissue for some time.  She is unable to do any of her activities of daily living and showering is quite difficult in terms of dyspnea.  She denies any presyncope or syncope.  She is able to lay flat.  She denies any significant peripheral edema.  She has  had no severe bleeding or bruising.  On review of her blood work her hemoglobin has been around 9-11 over the past several years.  She has been as low as around 6 few years ago.   She has both upper and lower dentures.    Past Surgical History:  Procedure Laterality Date   BREAST ENHANCEMENT SURGERY  1975   BREAST IMPLANT REMOVAL Bilateral 04/23/2016   Procedure: REMOVALBILATERAL BREAST IMPLANTS;  Surgeon: Wallace Going, DO;  Location: [facility];  Service: Plastics;  Laterality: Bilateral;   CHOLECYSTECTOMY  1973   ESOPHAGOGASTRODUODENOSCOPY N/A 10/25/2020   Procedure: ESOPHAGOGASTRODUODENOSCOPY (EGD);  Surgeon: Clarene Essex, MD;  Location: [facility];  Service: Endoscopy;  Laterality: N/A;  enteroscopy   HAND SURGERY  08/2012; 11/2012   HOT HEMOSTASIS N/A 10/25/2020   Procedure: HOT HEMOSTASIS (ARGON PLASMA COAGULATION/BICAP);  Surgeon: Clarene Essex, MD;  Location: [facility];  Service: Endoscopy;  Laterality: N/A;   IR RADIOLOGIST EVAL & MGMT  07/27/2017   IR SACROPLASTY BILATERAL  07/31/2017   IR VERTEBROPLASTY CERV/THOR BX INC UNI/BIL INC/INJECT/IMAGING  03/13/2020   IR VERTEBROPLASTY EA ADDL (T&LS) BX INC UNI/BIL INC INJECT/IMAGING  03/14/2020   MELANOMA EXCISION     at 31 yrs of age   ORIF HIP FRACTURE Right 02/22/2014   Procedure: OPEN REDUCTION INTERNAL FIXATION RIGHT HIP;  Surgeon: Sanjuana Kava, MD;  Location: [facility];  Service: Orthopedics;  Laterality: Right;   TOTAL ABDOMINAL HYSTERECTOMY  1974    Family History  Problem Relation Age of Onset   Pancreatic cancer Father    Raynaud syndrome Father    Cancer Father        pancreatic   Rashes / Skin problems Daughter        possibly scleraderma   Hip fracture Mother    Mental illness Mother        attempted suicide at 56 yo    Social History   Socioeconomic History   Marital status: Married    Spouse name: Not on file   Number of children: 2   Years of education: Not on file   Highest education  level: Some college, no degree  Occupational History   Occupation: retired swithboard operator at [employer]  Tobacco Use   Smoking status: Never   Smokeless tobacco: Never   Tobacco comments:    passive tobacco smoke exposure as child  Vaping Use   Vaping Use: Never used  Substance and Sexual Activity   Alcohol use: No   Drug use: No   Sexual activity: Not Currently    Birth control/protection: Surgical  Other Topics Concern   Not on file  Social History Narrative   Regular exercise: walkingCaffeine use: 1 cup of coffee daily   Lives in split level home with her husband   Social Determinants of Health   Financial Resource Strain: Low Risk  (04/29/2021)   Overall Financial Resource Strain (CARDIA)    Difficulty of Paying Living Expenses: Not hard at all  Food Insecurity: No Food Insecurity (04/29/2021)   Hunger Vital Sign    Worried About Running Out of Food in the Last Year: Never true    Ran Out of Food in the Last Year: Never true  Transportation Needs: No Transportation Needs (04/29/2021)   PRAPARE - Transportation    Lack of Transportation (Medical): No    Lack of Transportation (Non-Medical): No  Physical Activity: Inactive (06/14/2021)   Exercise Vital Sign    Days of Exercise per Week: 0 days    Minutes of Exercise per Session: 0 min  Stress: Stress Concern Present (06/14/2021)   [employer] - Occupational Stress Questionnaire    Feeling of Stress : Rather much  Social Connections: Socially Integrated (04/29/2021)   Social Connection and Isolation Panel [NHANES]    Frequency of Communication with Friends and Family: More than three times a week    Frequency of Social Gatherings with Friends and Family: Once a week    Attends Religious Services: 1 to 4 times per year    Active Member of Clubs or Organizations: Yes    Attends Club or Organization Meetings: 1 to 4 times per year    Marital Status: Married  Intimate Partner Violence: Not At  Risk (04/29/2021)   Humiliation, Afraid, Rape, and Kick questionnaire    Fear of Current or Ex-Partner: No    Emotionally Abused: No    Physically Abused: No    Sexually Abused: No     Prior to Admission medications   Medication Sig Start Date End Date Taking? Authorizing Provider  acetaminophen (TYLENOL) 500 MG tablet Take 1,000 mg by mouth every 6 (six) hours as needed for moderate pain or headache.    [provider]  CALCIUM PO Take by mouth.    [provider]  Cholecalciferol (VITAMIN D) 50 MCG (2000 UT) tablet Take 2,000 Units by mouth daily.    [provider]  clonazePAM (KLONOPIN) 0.5 MG tablet Take 1 tablet (0.5 mg   total) by mouth 2 (two) times daily as needed for anxiety. 02/13/22   Hassell Done, Mary-Margaret, FNP  doxycycline (VIBRA-TABS) 100 MG tablet Take 1 tablet (100 mg total) by mouth 2 (two) times daily. 1 po bid 02/13/22   Hassell Done, Mary-Margaret, FNP  ezetimibe (ZETIA) 10 MG tablet Take 1 tablet (10 mg total) by mouth daily. 02/13/22   Hassell Done, Mary-Margaret, FNP  FLUoxetine (PROZAC) 40 MG capsule Take 1 capsule (40 mg total) by mouth daily. 02/13/22   Hassell Done Mary-Margaret, FNP  fluticasone (FLONASE) 50 MCG/ACT nasal spray PLACE 2 SPRAYS INTO BOTH NOSTRILS DAILY 01/03/22   [name], FNP  Multiple Vitamins-Minerals (PRESERVISION/LUTEIN PO) Take by mouth.    [provider]  NIFEdipine (PROCARDIA XL/NIFEDICAL XL) 60 MG 24 hr tablet Take 1 tablet (60 mg total) by mouth daily. 02/13/22   Hassell Done Mary-Margaret, FNP  omeprazole (PRILOSEC) 20 MG capsule TAKE 1 TABLET DAILY 02/13/22   Hassell Done, Mary-Margaret, FNP  PROLIA 60 MG/ML SOSY injection Inject 60 mg into the skin every 6 (six) months. 10/16/21   Hassell Done, Mary-Margaret, FNP  sildenafil (REVATIO) 20 MG tablet Take 1 tablet (20 mg total) by mouth 2 (two) times daily. 02/13/22   Hassell Done, Mary-Margaret, FNP  SYNTHROID 75 MCG tablet TAKE (1) TABLET DAILY BE- FORE BREAKFAST. 02/13/22   [name], FNP    Allergies  Allergen Reactions   Bactrim [Sulfamethoxazole-Trimethoprim] Nausea And Vomiting   Dilaudid [Hydromorphone Hcl] Anaphylaxis   Dilaudid [Hydromorphone] Anaphylaxis   Acyclovir And Related     Unknown reaction   Crestor [Rosuvastatin Calcium] Other (See Comments)    Muscle pain   Gabapentin Swelling    Swelling in feet, and in legs   Aleve [Naproxen Sodium] Rash   Lyrica [Pregabalin] Other (See Comments)    Swelling in feet, and in legs    REVIEW OF SYSTEMS:  General: no fevers/chills/night sweats Eyes: no blurry vision, diplopia, or amaurosis ENT: no sore throat or hearing loss Resp: no cough, wheezing, or hemoptysis CV: no edema or palpitations GI: no abdominal pain, nausea, vomiting, diarrhea, or constipation GU: no dysuria, frequency, or hematuria Skin: no rash Neuro: no headache, numbness, tingling, or weakness of extremities Musculoskeletal: no joint pain or swelling Heme: no bleeding, DVT, or easy bruising Endo: no polydipsia or polyuria  BP 132/62   Pulse 74   Ht _0  (1.575 m)   Wt 100 lb 9.6 oz (45.6 kg)   BMI 18.40 kg/m   PHYSICAL EXAM: GEN:  AO x 3 in no acute distress HEENT: normal Dentition: Dentures Neck: JVP normal. +2 carotid upstrokes without bruits. No thyromegaly. Lungs: equal expansion, clear bilaterally CV: Apex is discrete and nondisplaced, RRR with 3 out of 6 systolic murmur Abd: soft, non-tender, non-distended; no bruit; positive bowel sounds Ext: no edema, ecchymoses, or cyanosis Vascular: 2+ femoral pulses, 2+ radial pulses       Skin: warm and dry without rash Neuro: CN II-XII grossly intact; motor and sensory grossly intact    DATA AND STUDIES:  EKG: Today's EKG demonstrates sinus rhythm with left ventricular hypertrophy with occasional PACs  2D ECHO: September 2023  1. The aortic valve is tricuspid. There is severe calcifcation of the  aortic valve. There is severe thickening of the aortic  valve. Aortic valve  regurgitation is trivial. Likely severe aortic valve stenosis with aortic  valve mean gradient of 48.5 mmHg  and Vmax 4.38 m/s. The patient is also hyperdynamic which contributes to  the elevated gradients. The aortic valve area  and DI calculate in the  moderate range with AVA 1.1cm2, DI 0.35 (LVOT VTI 35). Visually, however,  the valve appears severely stenosed.   2. Left ventricular ejection fraction, by estimation, is >75%. The left  ventricle has hyperdynamic function. The left ventricle has no regional  wall motion abnormalities. There is moderate concentric left ventricular  hypertrophy. Left ventricular  diastolic parameters are consistent with Grade II diastolic dysfunction  (pseudonormalization).   3. Right ventricular systolic function is normal. The right ventricular  size is normal. There is mildly elevated pulmonary artery systolic  pressure.   4. Left atrial size was severely dilated.   5. Right atrial size was mildly dilated.   6. The mitral valve is degenerative. Trivial mitral valve regurgitation.  Mild to moderate mitral stenosis. Severe mitral annular calcification. MVA  by PHT is 1.9cm2 and by continuity is 1.75cm2. Mean gradient is 28mHg at  HR 70bpm.   7. The inferior vena cava is normal in size with greater than 50%  respiratory variability, suggesting right atrial pressure of 3 mmHg.  CARDIAC CATH: n/a   STS RISK CALCULATOR: Pending  NHYA CLASS: 3    ASSESSMENT AND PLAN:   Aortic valve stenosis, etiology of cardiac valve disease unspecified - Plan: EKG 12-Lead, CBC, Basic metabolic panel  Iron deficiency anemia due to chronic blood loss  The patient has developed New York Heart Association class III symptoms of dyspnea.  She has competing reasons to be dyspneic including ongoing chronic anemia however she suffered quite an increase in her dyspnea over the past few months.  This is coincided with the detection of moderate to severe  aortic stenosis.  I reviewed her echocardiogram which demonstrates a severely calcified valve with a mean gradient of 48 mmHg consistent with severe aortic stenosis.  Her maximum velocity is over 4 m/s as well.  I think it makes sense to consider an aortic valve intervention in this patient given her lifestyle limiting dyspnea and to remove the contribution of her valvular disease to her symptom burden.  Certainly this would not address her ongoing anemia and she understands that even after successful intervention she may be somewhat dyspneic but hopefully improved versus how she feels right now.  For this reason I will refer her for a CT scan, coronary angiography and right heart catheterization study, and cardiothoracic surgical evaluation.  I have personally reviewed the patients imaging data as summarized above.  I have reviewed the natural history of aortic stenosis with the patient and family members who are present today. We have discussed the limitations of medical therapy and the poor prognosis associated with symptomatic aortic stenosis. We have also reviewed potential treatment options, including palliative medical therapy, conventional surgical aortic valve replacement, and transcatheter aortic valve replacement. We discussed treatment options in the context of this patient's specific comorbid medical conditions.   All of the patient's questions were answered today. Will make further recommendations based on the results of studies outlined above.   Total time spent with patient today 60 minutes. This includes reviewing records, evaluating the patient and coordinating care.   AEarly Osmond MD  03/28/2022 6:32 AM    [id number] [place]  [id number]: [phone] Fax: [phone]

## 2022-03-25 NOTE — Progress Notes (Signed)
Patient presents today for Venofer infusion per providers order.  Vital signs WNL.  Patient has no new complaints at this time.  Peripheral IV started and blood return noted pre and post infusion.  Stable during infusion without adverse affects.  Vital signs stable.  No complaints at this time.  Discharge from clinic ambulatory in stable condition.  Alert and oriented X 3.  Follow up with [facility] as scheduled.  

## 2022-03-26 DIAGNOSIS — H1789 Other corneal scars and opacities: Secondary | ICD-10-CM | POA: Diagnosis not present

## 2022-03-26 DIAGNOSIS — H353131 Nonexudative age-related macular degeneration, bilateral, early dry stage: Secondary | ICD-10-CM | POA: Diagnosis not present

## 2022-03-26 DIAGNOSIS — H16223 Keratoconjunctivitis sicca, not specified as Sjogren's, bilateral: Secondary | ICD-10-CM | POA: Diagnosis not present

## 2022-03-26 DIAGNOSIS — H1820 Unspecified corneal edema: Secondary | ICD-10-CM | POA: Diagnosis not present

## 2022-03-27 ENCOUNTER — Inpatient Hospital Stay: Payer: Medicare Other | Attending: Hematology | Admitting: Internal Medicine

## 2022-03-27 ENCOUNTER — Encounter: Payer: Self-pay | Admitting: Internal Medicine

## 2022-03-27 VITALS — BP 132/62 | HR 74 | Ht 62.0 in | Wt 100.6 lb

## 2022-03-27 DIAGNOSIS — I350 Nonrheumatic aortic (valve) stenosis: Secondary | ICD-10-CM | POA: Diagnosis not present

## 2022-03-27 DIAGNOSIS — D500 Iron deficiency anemia secondary to blood loss (chronic): Secondary | ICD-10-CM | POA: Diagnosis not present

## 2022-03-27 NOTE — Patient Instructions (Signed)
Medication Instructions:  No changes *If you need a refill on your cardiac medications before your next appointment, please call your pharmacy*   Lab Work: Today: cbc, bmet If you have labs (blood work) drawn today and your tests are completely normal, you will receive your results only by: [place] (if you have MyChart) OR A paper copy in the mail If you have any lab test that is abnormal or we need to change your treatment, we will call you to review the results.   Testing/Procedures: Your physician has requested that you have a cardiac catheterization. Cardiac catheterization is used to diagnose and/or treat various heart conditions. Doctors may recommend this procedure for a number of different reasons. The most common reason is to evaluate chest pain. Chest pain can be a symptom of coronary artery disease (CAD), and cardiac catheterization can show whether plaque is narrowing or blocking your heart's arteries. This procedure is also used to evaluate the valves, as well as measure the blood flow and oxygen levels in different parts of your heart. For further information please visit [email or web address]. Please follow instruction sheet, as given.   Follow-Up: Per Structural Heart Team  Other Instructions    Cardiac/Peripheral Catheterization   You are scheduled for a Cardiac Catheterization on Friday, September 22 with Dr. Lenna Sciara.  1. Please arrive at the Main Entrance A at [facility]: [place], [place] [address] on September 22 at 8:30 AM (This time is two hours before your procedure to ensure your preparation). Free valet parking service is available. You will check in at ADMITTING. The support person will be asked to wait in the waiting room.  It is OK to have someone drop you off and come back when you are ready to be discharged.        Special note: Every effort is made to have your procedure done on time. Please understand that emergencies  sometimes delay scheduled procedures.   . 2. Diet: Do not eat solid foods after midnight.  You may have clear liquids until 5 AM the day of the procedure.  3. Labs: You will need to have blood drawn today. You do not need to be fasting.  4. Medication instructions in preparation for your procedure:   Contrast Allergy: No  On the morning of your procedure, take Aspirin 81 mg and any morning medicines NOT listed above.  You may use sips of water.  5. Plan to go home the same day, you will only stay overnight if medically necessary. 6. You MUST have a responsible adult to drive you home. 7. An adult MUST be with you the first 24 hours after you arrive home. 8. Bring a current list of your medications, and the last time and date medication taken. 9. Bring ID and current insurance cards. 10.Please wear clothes that are easy to get on and off and wear slip-on shoes.  Thank you for allowing [place] to care for you!   -- [facility]

## 2022-03-27 NOTE — Progress Notes (Addendum)
Pre Surgical Assessment: 5 M Walk Test  17M=16.97f  5 Meter Walk Test- trial 1: 5.96 seconds 5 Meter Walk Test- trial 2: 5.54 seconds 5 Meter Walk Test- trial 3: 5.86 seconds 5 Meter Walk Test Average: 5.78 seconds  Procedure Type: Isolated AVR Perioperative Outcome Estimate % Operative Mortality 4.07% Morbidity & Mortality 12.2% Stroke 1.73% Renal Failure 1.57% Reoperation 4.84% Prolonged Ventilation 5.06% Deep Sternal Wound Infection 0.029% [facility] (>[facility] 6.23% Short Hospital Stay (<6 days)* 34.6%

## 2022-03-28 ENCOUNTER — Encounter (HOSPITAL_COMMUNITY): Payer: Self-pay | Admitting: Internal Medicine

## 2022-03-28 ENCOUNTER — Other Ambulatory Visit: Payer: Self-pay

## 2022-03-28 ENCOUNTER — Ambulatory Visit (HOSPITAL_COMMUNITY)
Admission: RE | Admit: 2022-03-28 | Discharge: 2022-03-28 | Disposition: A | Discharge status: Home / Self Care | Payer: Medicare Other | Attending: Internal Medicine | Admitting: Internal Medicine

## 2022-03-28 ENCOUNTER — Encounter (HOSPITAL_COMMUNITY)
Admission: RE | Disposition: A | Discharge status: Home / Self Care | Payer: Self-pay | Source: Home / Self Care | Attending: Internal Medicine

## 2022-03-28 DIAGNOSIS — E785 Hyperlipidemia, unspecified: Secondary | ICD-10-CM | POA: Diagnosis not present

## 2022-03-28 DIAGNOSIS — R0609 Other forms of dyspnea: Secondary | ICD-10-CM | POA: Diagnosis not present

## 2022-03-28 DIAGNOSIS — D500 Iron deficiency anemia secondary to blood loss (chronic): Secondary | ICD-10-CM | POA: Diagnosis not present

## 2022-03-28 DIAGNOSIS — M349 Systemic sclerosis, unspecified: Secondary | ICD-10-CM | POA: Insufficient documentation

## 2022-03-28 DIAGNOSIS — I2510 Atherosclerotic heart disease of native coronary artery without angina pectoris: Secondary | ICD-10-CM | POA: Insufficient documentation

## 2022-03-28 DIAGNOSIS — I350 Nonrheumatic aortic (valve) stenosis: Secondary | ICD-10-CM

## 2022-03-28 HISTORY — PX: RIGHT HEART CATH AND CORONARY ANGIOGRAPHY: CATH118264

## 2022-03-28 LAB — POCT I-STAT EG7
Acid-base deficit: 5 mmol/L — ABNORMAL HIGH (ref 0.0–2.0)
Bicarbonate: 19.8 mmol/L — ABNORMAL LOW (ref 20.0–28.0)
Calcium, Ion: 1.16 mmol/L (ref 1.15–1.40)
HCT: 22 % — ABNORMAL LOW (ref 36.0–46.0)
Hemoglobin: 7.5 g/dL — ABNORMAL LOW (ref 12.0–15.0)
O2 Saturation: 64 %
Potassium: 3.9 mmol/L (ref 3.5–5.1)
Sodium: 143 mmol/L (ref 135–145)
TCO2: 21 mmol/L — ABNORMAL LOW (ref 22–32)
pCO2, Ven: 33.5 mmHg — ABNORMAL LOW (ref 44–60)
pH, Ven: 7.379 (ref 7.25–7.43)
pO2, Ven: 33 mmHg (ref 32–45)

## 2022-03-28 LAB — CBC
Hematocrit: 27.5 % — ABNORMAL LOW (ref 34.0–46.6)
Hemoglobin: 8.6 g/dL — ABNORMAL LOW (ref 11.1–15.9)
MCH: 29.6 pg (ref 26.6–33.0)
MCHC: 31.3 g/dL — ABNORMAL LOW (ref 31.5–35.7)
MCV: 95 fL (ref 79–97)
Platelets: 316 10*3/uL (ref 150–450)
RBC: 2.91 x10E6/uL — ABNORMAL LOW (ref 3.77–5.28)
RDW: 13.9 % (ref 11.7–15.4)
WBC: 9.3 10*3/uL (ref 3.4–10.8)

## 2022-03-28 LAB — BASIC METABOLIC PANEL
BUN/Creatinine Ratio: 13 (ref 12–28)
BUN: 11 mg/dL (ref 8–27)
CO2: 19 mmol/L — ABNORMAL LOW (ref 20–29)
Calcium: 9.1 mg/dL (ref 8.7–10.3)
Chloride: 108 mmol/L — ABNORMAL HIGH (ref 96–106)
Creatinine, Ser: 0.85 mg/dL (ref 0.57–1.00)
Glucose: 96 mg/dL (ref 70–99)
Potassium: 4.8 mmol/L (ref 3.5–5.2)
Sodium: 142 mmol/L (ref 134–144)
eGFR: 69 mL/min/{1.73_m2} (ref >59)

## 2022-03-28 SURGERY — RIGHT HEART CATH AND CORONARY ANGIOGRAPHY
Anesthesia: LOCAL

## 2022-03-28 MED ORDER — ACETAMINOPHEN 325 MG PO TABS: 650.0000 mg | ORAL_TABLET | ORAL | Status: DC | PRN

## 2022-03-28 MED ORDER — FUROSEMIDE 20 MG PO TABS: 20.0000 mg | ORAL_TABLET | Freq: Every day | ORAL | 3 refills | Status: DC

## 2022-03-28 MED ORDER — FENTANYL CITRATE (PF) 100 MCG/2ML IJ SOLN
INTRAMUSCULAR | Status: DC | PRN
  Administered 2022-03-28 (×2): 25 ug via INTRAVENOUS

## 2022-03-28 MED ORDER — MIDAZOLAM HCL 2 MG/2ML IJ SOLN
INTRAMUSCULAR | Status: AC
  Filled 2022-03-28: qty 2

## 2022-03-28 MED ORDER — SODIUM CHLORIDE 0.9% FLUSH: 3.0000 mL | INTRAVENOUS | Status: DC | PRN

## 2022-03-28 MED ORDER — HEPARIN (PORCINE) IN NACL 1000-0.9 UT/500ML-% IV SOLN
INTRAVENOUS | Status: DC | PRN
  Administered 2022-03-28 (×2): 500 mL

## 2022-03-28 MED ORDER — SODIUM CHLORIDE 0.9% FLUSH: 3.0000 mL | Freq: Two times a day (BID) | INTRAVENOUS | Status: DC

## 2022-03-28 MED ORDER — SODIUM CHLORIDE 0.9 % IV SOLN: 250.0000 mL | INTRAVENOUS | Status: DC | PRN

## 2022-03-28 MED ORDER — HEPARIN SODIUM (PORCINE) 1000 UNIT/ML IJ SOLN
INTRAMUSCULAR | Status: DC | PRN
  Administered 2022-03-28: 5000 [IU] via INTRA_ARTERIAL

## 2022-03-28 MED ORDER — LIDOCAINE HCL (PF) 1 % IJ SOLN
INTRAMUSCULAR | Status: AC
  Filled 2022-03-28: qty 30

## 2022-03-28 MED ORDER — SODIUM CHLORIDE 0.9 % WEIGHT BASED INFUSION: 1.0000 mL/kg/h | INTRAVENOUS | Status: DC

## 2022-03-28 MED ORDER — VERAPAMIL HCL 2.5 MG/ML IV SOLN: INTRAVENOUS | Status: DC | PRN

## 2022-03-28 MED ORDER — ASPIRIN 81 MG PO CHEW
81.0000 mg | CHEWABLE_TABLET | ORAL | Status: AC
  Administered 2022-03-28: 81 mg via ORAL

## 2022-03-28 MED ORDER — HYDRALAZINE HCL 20 MG/ML IJ SOLN: 10.0000 mg | INTRAMUSCULAR | Status: DC | PRN

## 2022-03-28 MED ORDER — ONDANSETRON HCL 4 MG/2ML IJ SOLN: 4.0000 mg | Freq: Four times a day (QID) | INTRAMUSCULAR | Status: DC | PRN

## 2022-03-28 MED ORDER — VERAPAMIL HCL 2.5 MG/ML IV SOLN
INTRAVENOUS | Status: AC
  Filled 2022-03-28: qty 2

## 2022-03-28 MED ORDER — IOHEXOL 350 MG/ML SOLN
INTRAVENOUS | Status: DC | PRN
  Administered 2022-03-28: 32 mL

## 2022-03-28 MED ORDER — LIDOCAINE HCL (PF) 1 % IJ SOLN
INTRAMUSCULAR | Status: DC | PRN
  Administered 2022-03-28 (×3): 2 mL

## 2022-03-28 MED ORDER — SODIUM CHLORIDE 0.9 % WEIGHT BASED INFUSION
3.0000 mL/kg/h | INTRAVENOUS | Status: AC
  Administered 2022-03-28: 3 mL/kg/h via INTRAVENOUS

## 2022-03-28 MED ORDER — HEPARIN SODIUM (PORCINE) 1000 UNIT/ML IJ SOLN
INTRAMUSCULAR | Status: AC
  Filled 2022-03-28: qty 10

## 2022-03-28 MED ORDER — FENTANYL CITRATE (PF) 100 MCG/2ML IJ SOLN
INTRAMUSCULAR | Status: AC
  Filled 2022-03-28: qty 2

## 2022-03-28 MED ORDER — MIDAZOLAM HCL 2 MG/2ML IJ SOLN
INTRAMUSCULAR | Status: DC | PRN
  Administered 2022-03-28 (×2): 1 mg via INTRAVENOUS

## 2022-03-28 MED ORDER — LABETALOL HCL 5 MG/ML IV SOLN: 10.0000 mg | INTRAVENOUS | Status: DC | PRN

## 2022-03-28 MED ORDER — HEPARIN (PORCINE) IN NACL 1000-0.9 UT/500ML-% IV SOLN
INTRAVENOUS | Status: AC
  Filled 2022-03-28: qty 1000

## 2022-03-28 SURGICAL SUPPLY — 19 items
CATH BALLN WEDGE 5F 110CM (CATHETERS) IMPLANT
CATH INFINITI 5FR ANG PIGTAIL (CATHETERS) IMPLANT
CATH INFINITI JR4 5F (CATHETERS) IMPLANT
CATH OPTITORQUE TIG 4.0 6F (CATHETERS) IMPLANT
CATH SWAN GANZ 7F STRAIGHT (CATHETERS) IMPLANT
DEVICE RAD COMP TR BAND LRG (VASCULAR PRODUCTS) IMPLANT
GLIDESHEATH SLEND SS 6F .021 (SHEATH) IMPLANT
GUIDEWIRE INQWIRE 1.5J.035X260 (WIRE) IMPLANT
INQWIRE 1.5J .035X260CM (WIRE) ×1
KIT HEART LEFT (KITS) ×2 IMPLANT
KIT MICROPUNCTURE NIT STIFF (SHEATH) IMPLANT
PACK CARDIAC CATHETERIZATION (CUSTOM PROCEDURE TRAY) ×2 IMPLANT
PROTECTION STATION PRESSURIZED (MISCELLANEOUS) ×1
SET ATX SIMPLICITY (MISCELLANEOUS) IMPLANT
SHEATH GLIDE SLENDER 4/5FR (SHEATH) IMPLANT
SHEATH PINNACLE 7F 10CM (SHEATH) IMPLANT
SHEATH PROBE COVER 6X72 (BAG) IMPLANT
STATION PROTECTION PRESSURIZED (MISCELLANEOUS) IMPLANT
TUBING CIL FLEX 10 FLL-RA (TUBING) ×2 IMPLANT

## 2022-03-28 NOTE — Progress Notes (Signed)
Patient and daughter was given discharge instructions. Both verbalized understanding. 

## 2022-03-28 NOTE — Discharge Instructions (Signed)

## 2022-03-28 NOTE — Interval H&P Note (Signed)
History and Physical Interval Note:  03/28/2022 9:06 AM  Haley Trevino  has presented today for surgery, with the diagnosis of severe aortic stenosis.  The various methods of treatment have been discussed with the patient and family. After consideration of risks, benefits and other options for treatment, the patient has consented to  Procedure(s): RIGHT/LEFT HEART CATH AND CORONARY ANGIOGRAPHY (N/A) as a surgical intervention.  The patient's history has been reviewed, patient examined, no change in status, stable for surgery.  I have reviewed the patient's chart and labs.  Questions were answered to the patient's satisfaction.     Early Osmond

## 2022-03-29 LAB — POCT I-STAT EG7
Acid-base deficit: 6 mmol/L — ABNORMAL HIGH (ref 0.0–2.0)
Bicarbonate: 18.8 mmol/L — ABNORMAL LOW (ref 20.0–28.0)
Calcium, Ion: 1.14 mmol/L — ABNORMAL LOW (ref 1.15–1.40)
HCT: 23 % — ABNORMAL LOW (ref 36.0–46.0)
Hemoglobin: 7.8 g/dL — ABNORMAL LOW (ref 12.0–15.0)
O2 Saturation: 68 %
Potassium: 3.8 mmol/L (ref 3.5–5.1)
Sodium: 138 mmol/L (ref 135–145)
TCO2: 20 mmol/L — ABNORMAL LOW (ref 22–32)
pCO2, Ven: 34.1 mmHg — ABNORMAL LOW (ref 44–60)
pH, Ven: 7.349 (ref 7.25–7.43)
pO2, Ven: 37 mmHg (ref 32–45)

## 2022-03-29 LAB — POCT I-STAT 7, (LYTES, BLD GAS, ICA,H+H)
Acid-base deficit: 5 mmol/L — ABNORMAL HIGH (ref 0.0–2.0)
Bicarbonate: 18.9 mmol/L — ABNORMAL LOW (ref 20.0–28.0)
Calcium, Ion: 1.15 mmol/L (ref 1.15–1.40)
HCT: 21 % — ABNORMAL LOW (ref 36.0–46.0)
Hemoglobin: 7.1 g/dL — ABNORMAL LOW (ref 12.0–15.0)
O2 Saturation: 88 %
Potassium: 3.9 mmol/L (ref 3.5–5.1)
Sodium: 144 mmol/L (ref 135–145)
TCO2: 20 mmol/L — ABNORMAL LOW (ref 22–32)
pCO2 arterial: 28 mmHg — ABNORMAL LOW (ref 32–48)
pH, Arterial: 7.436 (ref 7.35–7.45)
pO2, Arterial: 51 mmHg — ABNORMAL LOW (ref 83–108)

## 2022-04-03 MED FILL — Iron Sucrose Inj 20 MG/ML (Fe Equiv): INTRAVENOUS | Qty: 15 | Status: AC

## 2022-04-03 NOTE — Addendum Note (Signed)
Addended by: Venetia Maxon on: 04/03/2022 09:53 AM   Modules accepted: Orders

## 2022-04-04 ENCOUNTER — Inpatient Hospital Stay: Payer: Medicare Other

## 2022-04-04 VITALS — BP 116/40 | HR 72 | Temp 98.3°F | Resp 18

## 2022-04-04 DIAGNOSIS — D500 Iron deficiency anemia secondary to blood loss (chronic): Secondary | ICD-10-CM | POA: Diagnosis not present

## 2022-04-04 DIAGNOSIS — R0602 Shortness of breath: Secondary | ICD-10-CM | POA: Diagnosis not present

## 2022-04-04 DIAGNOSIS — I10 Essential (primary) hypertension: Secondary | ICD-10-CM | POA: Diagnosis not present

## 2022-04-04 DIAGNOSIS — R079 Chest pain, unspecified: Secondary | ICD-10-CM | POA: Diagnosis not present

## 2022-04-04 DIAGNOSIS — K922 Gastrointestinal hemorrhage, unspecified: Secondary | ICD-10-CM | POA: Diagnosis not present

## 2022-04-04 MED ORDER — SODIUM CHLORIDE 0.9 % IV SOLN
300.0000 mg | Freq: Once | INTRAVENOUS | Status: AC
  Administered 2022-04-04: 300 mg via INTRAVENOUS
  Filled 2022-04-04: qty 300

## 2022-04-04 MED ORDER — LORATADINE 10 MG PO TABS
10.0000 mg | ORAL_TABLET | Freq: Once | ORAL | Status: AC
  Administered 2022-04-04: 10 mg via ORAL
  Filled 2022-04-04: qty 1

## 2022-04-04 MED ORDER — ACETAMINOPHEN 325 MG PO TABS
650.0000 mg | ORAL_TABLET | Freq: Once | ORAL | Status: AC
  Administered 2022-04-04: 650 mg via ORAL
  Filled 2022-04-04: qty 2

## 2022-04-04 MED ORDER — SODIUM CHLORIDE 0.9 % IV SOLN: Freq: Once | INTRAVENOUS | Status: AC

## 2022-04-04 NOTE — Progress Notes (Signed)
Patient presents today for Venofer infusion per providers order.  Vital signs WNL.  Patient has no new complaints at this time.  Peripheral IV started and blood return noted pre and post infusion.  Stable during infusion without adverse affects.  Vital signs stable.  No complaints at this time.  Discharge from clinic ambulatory in stable condition.  Alert and oriented X 3.  Follow up with [facility] as scheduled.  

## 2022-04-04 NOTE — Patient Instructions (Signed)
[facility]  Discharge Instructions: Thank you for choosing [facility] to provide your oncology and hematology care.  If you have a lab appointment with the Cancer Center, please come in thru the Main Entrance and check in at the main information desk.  Wear comfortable clothing and clothing appropriate for easy access to any Portacath or PICC line.   We strive to give you quality time with your provider. You may need to reschedule your appointment if you arrive late (15 or more minutes).  Arriving late affects you and other patients whose appointments are after yours.  Also, if you miss three or more appointments without notifying the office, you may be dismissed from the clinic at the provider's discretion.      For prescription refill requests, have your pharmacy contact our office and allow 72 hours for refills to be completed.    Today you received the following chemotherapy and/or immunotherapy agents Venofer      To help prevent nausea and vomiting after your treatment, we encourage you to take your nausea medication as directed.  BELOW ARE SYMPTOMS THAT SHOULD BE REPORTED IMMEDIATELY: *FEVER GREATER THAN 100.4 F (38 C) OR HIGHER *CHILLS OR SWEATING *NAUSEA AND VOMITING THAT IS NOT CONTROLLED WITH YOUR NAUSEA MEDICATION *UNUSUAL SHORTNESS OF BREATH *UNUSUAL BRUISING OR BLEEDING *URINARY PROBLEMS (pain or burning when urinating, or frequent urination) *BOWEL PROBLEMS (unusual diarrhea, constipation, pain near the anus) TENDERNESS IN MOUTH AND THROAT WITH OR WITHOUT PRESENCE OF ULCERS (sore throat, sores in mouth, or a toothache) UNUSUAL RASH, SWELLING OR PAIN  UNUSUAL VAGINAL DISCHARGE OR ITCHING   Items with * indicate a potential emergency and should be followed up as soon as possible or go to the Emergency Department if any problems should occur.  Please show the CHEMOTHERAPY ALERT CARD or IMMUNOTHERAPY ALERT CARD at check-in to the Emergency  Department and triage nurse.  Should you have questions after your visit or need to cancel or reschedule your appointment, please contact [facility] [phone]  and follow the prompts.  Office hours are 8:00 a.m. to 4:30 p.m. Monday - Friday. Please note that voicemails left after 4:00 p.m. may not be returned until the following business day.  We are closed weekends and major holidays. You have access to a nurse at all times for urgent questions. Please call the main number to the clinic [phone] and follow the prompts.  For any non-urgent questions, you may also contact your provider using MyChart. We now offer e-Visits for anyone 18 and older to request care online for non-urgent symptoms. For details visit [email or web address].   Also download the MyChart app! Go to the app store, search "MyChart", open the app, select , and log in with your MyChart username and password.  Masks are optional in the cancer centers. If you would like for your care team to wear a mask while they are taking care of you, please let them know. You may have one support person who is at least 80 years old accompany you for your appointments.  

## 2022-04-07 ENCOUNTER — Ambulatory Visit (HOSPITAL_COMMUNITY)
Admission: RE | Admit: 2022-04-07 | Discharge: 2022-04-07 | Disposition: A | Discharge status: Home / Self Care | Payer: Medicare Other | Source: Ambulatory Visit | Attending: Internal Medicine | Admitting: Internal Medicine

## 2022-04-07 DIAGNOSIS — Z01818 Encounter for other preprocedural examination: Secondary | ICD-10-CM | POA: Diagnosis not present

## 2022-04-07 DIAGNOSIS — I350 Nonrheumatic aortic (valve) stenosis: Secondary | ICD-10-CM | POA: Diagnosis not present

## 2022-04-07 DIAGNOSIS — I700 Atherosclerosis of aorta: Secondary | ICD-10-CM | POA: Diagnosis not present

## 2022-04-07 MED ORDER — IOHEXOL 350 MG/ML SOLN
100.0000 mL | Freq: Once | INTRAVENOUS | Status: AC | PRN
  Administered 2022-04-07: 100 mL via INTRAVENOUS

## 2022-04-17 ENCOUNTER — Inpatient Hospital Stay: Payer: Medicare Other | Attending: Hematology

## 2022-04-17 ENCOUNTER — Other Ambulatory Visit: Payer: Self-pay | Admitting: Physician Assistant

## 2022-04-17 ENCOUNTER — Other Ambulatory Visit: Payer: Self-pay

## 2022-04-17 DIAGNOSIS — D500 Iron deficiency anemia secondary to blood loss (chronic): Secondary | ICD-10-CM

## 2022-04-17 DIAGNOSIS — K922 Gastrointestinal hemorrhage, unspecified: Secondary | ICD-10-CM | POA: Diagnosis not present

## 2022-04-17 LAB — CBC WITH DIFFERENTIAL/PLATELET
Abs Immature Granulocytes: 0.01 10*3/uL (ref 0.00–0.07)
Basophils Absolute: 0 10*3/uL (ref 0.0–0.1)
Basophils Relative: 1 %
Eosinophils Absolute: 0.1 10*3/uL (ref 0.0–0.5)
Eosinophils Relative: 2 %
HCT: 30 % — ABNORMAL LOW (ref 36.0–46.0)
Hemoglobin: 9.3 g/dL — ABNORMAL LOW (ref 12.0–15.0)
Immature Granulocytes: 0 %
Lymphocytes Relative: 16 %
Lymphs Abs: 0.8 10*3/uL (ref 0.7–4.0)
MCH: 29.7 pg (ref 26.0–34.0)
MCHC: 31 g/dL (ref 30.0–36.0)
MCV: 95.8 fL (ref 80.0–100.0)
Monocytes Absolute: 0.3 10*3/uL (ref 0.1–1.0)
Monocytes Relative: 6 %
Neutro Abs: 3.9 10*3/uL (ref 1.7–7.7)
Neutrophils Relative %: 75 %
Platelets: 271 10*3/uL (ref 150–400)
RBC: 3.13 MIL/uL — ABNORMAL LOW (ref 3.87–5.11)
RDW: 15.4 % (ref 11.5–15.5)
WBC: 5.1 10*3/uL (ref 4.0–10.5)
nRBC: 0 % (ref 0.0–0.2)

## 2022-04-17 LAB — FERRITIN: Ferritin: 81 ng/mL (ref 11–307)

## 2022-04-17 LAB — IRON AND TIBC
Iron: 46 ug/dL (ref 28–170)
Saturation Ratios: 14 % (ref 10.4–31.8)
TIBC: 334 ug/dL (ref 250–450)
UIBC: 288 ug/dL

## 2022-04-17 LAB — SAMPLE TO BLOOD BANK

## 2022-04-17 NOTE — Progress Notes (Signed)
Patient called today reporting that she feels the way she does when she needs an iron infusion requesting that her labs be checked. Patient scheduled today for CBCd, BB Sample, Iron/TIBC and Ferritin per Dr. Delton Coombes.

## 2022-04-21 ENCOUNTER — Telehealth: Payer: Self-pay | Admitting: Nurse Practitioner

## 2022-04-21 MED ORDER — PROLIA 60 MG/ML ~~LOC~~ SOSY: 60.0000 mg | PREFILLED_SYRINGE | SUBCUTANEOUS | 0 refills | Status: DC

## 2022-04-21 NOTE — Telephone Encounter (Signed)
Patient aware that prolia sent to pharmacy. Patient will call Cardiologist and see if he is ok to take and will call back to schedule. Patient aware she is due after 10/26.

## 2022-04-22 ENCOUNTER — Other Ambulatory Visit: Payer: Medicare Other

## 2022-04-24 ENCOUNTER — Inpatient Hospital Stay: Payer: Medicare Other

## 2022-04-24 VITALS — BP 106/37 | HR 73 | Temp 98.2°F | Resp 16

## 2022-04-24 DIAGNOSIS — K922 Gastrointestinal hemorrhage, unspecified: Secondary | ICD-10-CM | POA: Diagnosis not present

## 2022-04-24 DIAGNOSIS — D500 Iron deficiency anemia secondary to blood loss (chronic): Secondary | ICD-10-CM

## 2022-04-24 MED ORDER — ACETAMINOPHEN 325 MG PO TABS
650.0000 mg | ORAL_TABLET | Freq: Once | ORAL | Status: AC
  Administered 2022-04-24: 650 mg via ORAL
  Filled 2022-04-24: qty 2

## 2022-04-24 MED ORDER — LORATADINE 10 MG PO TABS
10.0000 mg | ORAL_TABLET | Freq: Once | ORAL | Status: AC
  Administered 2022-04-24: 10 mg via ORAL
  Filled 2022-04-24: qty 1

## 2022-04-24 MED ORDER — SODIUM CHLORIDE 0.9 % IV SOLN: Freq: Once | INTRAVENOUS | Status: AC

## 2022-04-24 MED ORDER — SODIUM CHLORIDE 0.9 % IV SOLN
300.0000 mg | Freq: Once | INTRAVENOUS | Status: AC
  Administered 2022-04-24: 300 mg via INTRAVENOUS
  Filled 2022-04-24: qty 300

## 2022-04-24 NOTE — Progress Notes (Signed)
Pt presents today for Venofer IV iron per provider's order. Vital signs stable and pt voiced no new complaints at this time.  Peripheral IV started with good blood return pre and post infusion.  Venofer 300 mg  given today per MD orders. Tolerated infusion without adverse affects. Vital signs stable. No complaints at this time. [name] Alert and oriented x 3. F/U with [facility] as scheduled.   

## 2022-04-24 NOTE — Patient Instructions (Signed)
[facility]  Discharge Instructions: Thank you for choosing [facility] to provide your oncology and hematology care.  If you have a lab appointment with the Cancer Center, please come in thru the Main Entrance and check in at the main information desk.  Wear comfortable clothing and clothing appropriate for easy access to any Portacath or PICC line.   We strive to give you quality time with your provider. You may need to reschedule your appointment if you arrive late (15 or more minutes).  Arriving late affects you and other patients whose appointments are after yours.  Also, if you miss three or more appointments without notifying the office, you may be dismissed from the clinic at the provider's discretion.      For prescription refill requests, have your pharmacy contact our office and allow 72 hours for refills to be completed.    Today you received Venofer IV iron infusion.     BELOW ARE SYMPTOMS THAT SHOULD BE REPORTED IMMEDIATELY: *FEVER GREATER THAN 100.4 F (38 C) OR HIGHER *CHILLS OR SWEATING *NAUSEA AND VOMITING THAT IS NOT CONTROLLED WITH YOUR NAUSEA MEDICATION *UNUSUAL SHORTNESS OF BREATH *UNUSUAL BRUISING OR BLEEDING *URINARY PROBLEMS (pain or burning when urinating, or frequent urination) *BOWEL PROBLEMS (unusual diarrhea, constipation, pain near the anus) TENDERNESS IN MOUTH AND THROAT WITH OR WITHOUT PRESENCE OF ULCERS (sore throat, sores in mouth, or a toothache) UNUSUAL RASH, SWELLING OR PAIN  UNUSUAL VAGINAL DISCHARGE OR ITCHING   Items with * indicate a potential emergency and should be followed up as soon as possible or go to the Emergency Department if any problems should occur.  Please show the CHEMOTHERAPY ALERT CARD or IMMUNOTHERAPY ALERT CARD at check-in to the Emergency Department and triage nurse.  Should you have questions after your visit or need to cancel or reschedule your appointment, please contact [facility] AT  [phone]  and follow the prompts.  Office hours are 8:00 a.m. to 4:30 p.m. Monday - Friday. Please note that voicemails left after 4:00 p.m. may not be returned until the following business day.  We are closed weekends and major holidays. You have access to a nurse at all times for urgent questions. Please call the main number to the clinic [phone] and follow the prompts.  For any non-urgent questions, you may also contact your provider using MyChart. We now offer e-Visits for anyone 18 and older to request care online for non-urgent symptoms. For details visit [email or web address].   Also download the MyChart app! Go to the app store, search "MyChart", open the app, select [facility], and log in with your MyChart username and password.  Masks are optional in the cancer centers. If you would like for your care team to wear a mask while they are taking care of you, please let them know. You may have one support person who is at least 80 years old accompany you for your appointments.  

## 2022-04-27 NOTE — Progress Notes (Unsigned)
[address]       [place] [address]             [phone]           Haley Trevino [place] Medical Record #[id number] Date of Birth: [birth year]  [name], MD [name], FNP  Chief Complaint:   No chief complaint on file.   History of Present Illness:           Past Medical History:  Diagnosis Date   Cataract    Depression    GERD (gastroesophageal reflux disease)    Hyperlipidemia    Hypertension    Hypothyroidism    IBS (irritable bowel syndrome)    Osteoarthritis    Raynaud disease    Scleroderma ([place])    Thyroid disease    hypothyroidism    Past Surgical History:  Procedure Laterality Date   BREAST ENHANCEMENT SURGERY  1975   BREAST IMPLANT REMOVAL Bilateral 04/23/2016   Procedure: REMOVALBILATERAL BREAST IMPLANTS;  Surgeon: Wallace Going, DO;  Location: [facility];  Service: Plastics;  Laterality: Bilateral;   CHOLECYSTECTOMY  1973   ESOPHAGOGASTRODUODENOSCOPY N/A 10/25/2020   Procedure: ESOPHAGOGASTRODUODENOSCOPY (EGD);  Surgeon: Clarene Essex, MD;  Location: [facility];  Service: Endoscopy;  Laterality: N/A;  enteroscopy   HAND SURGERY  08/2012; 11/2012   HOT HEMOSTASIS N/A 10/25/2020   Procedure: HOT HEMOSTASIS (ARGON PLASMA COAGULATION/BICAP);  Surgeon: Clarene Essex, MD;  Location: [facility];  Service: Endoscopy;  Laterality: N/A;   IR RADIOLOGIST EVAL & MGMT  07/27/2017   IR SACROPLASTY BILATERAL  07/31/2017   IR VERTEBROPLASTY CERV/THOR BX INC UNI/BIL INC/INJECT/IMAGING  03/13/2020   IR VERTEBROPLASTY EA ADDL (T&LS) BX INC UNI/BIL INC INJECT/IMAGING  03/14/2020   MELANOMA EXCISION     at 31 yrs of age   ORIF HIP FRACTURE Right 02/22/2014   Procedure: OPEN REDUCTION INTERNAL FIXATION RIGHT HIP;  Surgeon: Sanjuana Kava, MD;  Location: [facility];  Service: Orthopedics;  Laterality: Right;   RIGHT HEART CATH AND CORONARY ANGIOGRAPHY N/A 03/28/2022   Procedure: RIGHT HEART CATH AND CORONARY  ANGIOGRAPHY;  Surgeon: Early Osmond, MD;  Location: [facility];  Service: Cardiovascular;  Laterality: N/A;   TOTAL ABDOMINAL HYSTERECTOMY  1974    Social History   Tobacco Use  Smoking Status Never  Smokeless Tobacco Never  Tobacco Comments   passive tobacco smoke exposure as child    Social History   Substance and Sexual Activity  Alcohol Use No    Social History   Socioeconomic History   Marital status: Married    Spouse name: Not on file   Number of children: 2   Years of education: Not on file   Highest education level: Some college, no degree  Occupational History   Occupation: retired Occupational psychologist at [employer]  Tobacco Use   Smoking status: Never   Smokeless tobacco: Never   Tobacco comments:    passive tobacco smoke exposure as child  Vaping Use   Vaping Use: Never used  Substance and Sexual Activity   Alcohol use: No   Drug use: No   Sexual activity: Not Currently    Birth control/protection: Surgical  Other Topics Concern   Not on file  Social History Narrative   Regular exercise: walkingCaffeine use: 1 cup of coffee daily   Lives in split level home with her husband   Social Determinants of Health   Financial Resource Strain: Low Risk  (  04/29/2021)   Overall Financial Resource Strain (CARDIA)    Difficulty of Paying Living Expenses: Not hard at all  Food Insecurity: No Food Insecurity (04/29/2021)   Hunger Vital Sign    Worried About Running Out of Food in the Last Year: Never true    Ran Out of Food in the Last Year: Never true  Transportation Needs: No Transportation Needs (04/29/2021)   PRAPARE - Hydrologist (Medical): No    Lack of Transportation (Non-Medical): No  Physical Activity: Inactive (06/14/2021)   Exercise Vital Sign    Days of Exercise per Week: 0 days    Minutes of Exercise per Session: 0 min  Stress: Stress Concern Present (06/14/2021)   [place]    Feeling of Stress : Rather much  Social Connections: Socially Integrated (04/29/2021)   Social Connection and Isolation Panel [NHANES]    Frequency of Communication with Friends and Family: More than three times a week    Frequency of Social Gatherings with Friends and Family: Once a week    Attends Religious Services: 1 to 4 times per year    Active Member of [employer] or Organizations: Yes    Attends Archivist Meetings: 1 to 4 times per year    Marital Status: Married  Human resources officer Violence: Not At Risk (04/29/2021)   Humiliation, Afraid, Rape, and Kick questionnaire    Fear of Current or Ex-Partner: No    Emotionally Abused: No    Physically Abused: No    Sexually Abused: No    Allergies  Allergen Reactions   Bactrim [Sulfamethoxazole-Trimethoprim] Nausea And Vomiting   Dilaudid [Hydromorphone Hcl] Anaphylaxis   Dilaudid [Hydromorphone] Anaphylaxis   Acyclovir And Related     Unknown reaction   Crestor [Rosuvastatin Calcium] Other (See Comments)    Muscle pain   Gabapentin Swelling    Swelling in feet, and in legs   Aleve [Naproxen Sodium] Rash   Lyrica [Pregabalin] Other (See Comments)    Swelling in feet, and in legs    Current Outpatient Medications  Medication Sig Dispense Refill   acetaminophen (TYLENOL) 500 MG tablet Take 1,000 mg by mouth every 6 (six) hours as needed for moderate pain or headache.     CALCIUM PO Take by mouth.     Cholecalciferol (VITAMIN D) 50 MCG (2000 UT) tablet Take 2,000 Units by mouth daily.     clonazePAM (KLONOPIN) 0.5 MG tablet Take 1 tablet (0.5 mg total) by mouth 2 (two) times daily as needed for anxiety. 180 tablet 1   ezetimibe (ZETIA) 10 MG tablet Take 1 tablet (10 mg total) by mouth daily. 90 tablet 1   FLUoxetine (PROZAC) 40 MG capsule Take 1 capsule (40 mg total) by mouth daily. 90 capsule 1   fluticasone (FLONASE) 50 MCG/ACT nasal spray PLACE 2 SPRAYS INTO BOTH NOSTRILS DAILY 16 g 6    furosemide (LASIX) 20 MG tablet Take 1 tablet (20 mg total) by mouth daily. 90 tablet 3   Multiple Vitamins-Minerals (PRESERVISION/LUTEIN PO) Take by mouth.     NIFEdipine (PROCARDIA XL/NIFEDICAL XL) 60 MG 24 hr tablet Take 1 tablet (60 mg total) by mouth daily. 90 tablet 1   omeprazole (PRILOSEC) 20 MG capsule TAKE 1 TABLET DAILY 90 capsule 1   PROLIA 60 MG/ML SOSY injection Inject 60 mg into the skin every 6 (six) months. 180 mL 0   RESTASIS 0.05 % ophthalmic emulsion 1 drop  2 (two) times daily.     sildenafil (REVATIO) 20 MG tablet Take 1 tablet (20 mg total) by mouth 2 (two) times daily. 180 tablet 1   SYNTHROID 75 MCG tablet TAKE (1) TABLET DAILY BE- FORE BREAKFAST. 90 tablet 1   No current facility-administered medications for this visit.     Family History  Problem Relation Age of Onset   Pancreatic cancer Father    Raynaud syndrome Father    Cancer Father        pancreatic   Rashes / Skin problems Daughter        possibly scleraderma   Hip fracture Mother    Mental illness Mother        attempted suicide at 42 yo     Physical Exam: There were no vitals taken for this visit.     Diagnostic Studies & Laboratory data: I have personally reviewed the following studies and agree with the findings   TTE (03/2022)  IMPRESSIONS    1. The aortic valve is tricuspid. There is severe calcifcation of the  aortic valve. There is severe thickening of the aortic valve. Aortic valve  regurgitation is trivial. Likely severe aortic valve stenosis with aortic  valve mean gradient of 48.5 mmHg  and Vmax 4.38 m/s. The patient is also hyperdynamic which contributes to  the elevated gradients. The aortic valve area and DI calculate in the  moderate range with AVA 1.1cm2, DI 0.35 (LVOT VTI 35). Visually, however,  the valve appears severely stenosed.   2. Left ventricular ejection fraction, by estimation, is >75%. The left  ventricle has hyperdynamic function. The left ventricle has no  regional  wall motion abnormalities. There is moderate concentric left ventricular  hypertrophy. Left ventricular  diastolic parameters are consistent with Grade II diastolic dysfunction  (pseudonormalization).   3. Right ventricular systolic function is normal. The right ventricular  size is normal. There is mildly elevated pulmonary artery systolic  pressure.   4. Left atrial size was severely dilated.   5. Right atrial size was mildly dilated.   6. The mitral valve is degenerative. Trivial mitral valve regurgitation.  Mild to moderate mitral stenosis. Severe mitral annular calcification. MVA  by PHT is 1.9cm2 and by continuity is 1.75cm2. Mean gradient is 66mHg at  HR 70bpm.   7. The inferior vena cava is normal in size with greater than 50%  respiratory variability, suggesting right atrial pressure of 3 mmHg.   Comparison(s): Compared to prior TTE report in 2019, the AS has progressed  from mild to severe and there is now mild-to-moderate calcific MS.   FINDINGS   Left Ventricle: Left ventricular ejection fraction, by estimation, is  >75%. The left ventricle has hyperdynamic function. The left ventricle has  no regional wall motion abnormalities. The left ventricular internal  cavity size was normal in size. There  is moderate concentric left ventricular hypertrophy. Left ventricular  diastolic parameters are consistent with Grade II diastolic dysfunction  (pseudonormalization).   Right Ventricle: The right ventricular size is normal. No increase in  right ventricular wall thickness. Right ventricular systolic function is  normal. There is mildly elevated pulmonary artery systolic pressure. The  tricuspid regurgitant velocity is 2.97   m/s, and with an assumed right atrial pressure of 3 mmHg, the estimated  right ventricular systolic pressure is 322.9mmHg.   Left Atrium: Left atrial size was severely dilated.   Right Atrium: Right atrial size was mildly dilated.    Pericardium: There is  no evidence of pericardial effusion.   Mitral Valve: The mitral valve is degenerative in appearance. Severe  mitral annular calcification. Trivial mitral valve regurgitation. Mild to  moderate mitral valve stenosis. MV peak gradient, 14.7 mmHg. The mean  mitral valve gradient is 6.0 mmHg. MVA by   PHT 1.9cm2 and 1.75cm2 by continuity.     Tricuspid Valve: The tricuspid valve is normal in structure. Tricuspid  valve regurgitation is trivial.   Aortic Valve: The aortic valve is tricuspid. There is severe calcifcation  of the aortic valve. There is severe thickening of the aortic valve.  Aortic valve regurgitation is trivial. Severe aortic stenosis is present.  Aortic valve mean gradient measures  48.5 mmHg. Aortic valve peak gradient measures 76.9 mmHg. Aortic valve  area, by VTI measures 1.12 cm.   Pulmonic Valve: The pulmonic valve was normal in structure. Pulmonic valve  regurgitation is trivial.   Aorta: The aortic root is normal in size and structure.   Venous: The inferior vena cava is normal in size with greater than 50%  respiratory variability, suggesting right atrial pressure of 3 mmHg.   IAS/Shunts: The atrial septum is grossly normal.      LEFT VENTRICLE  PLAX 2D  LVIDd:         4.00 cm   Diastology  LVIDs:         1.90 cm   LV e' medial:    3.70 cm/s  LV PW:         1.20 cm   LV E/e' medial:  44.9  LV IVS:        1.20 cm   LV e' lateral:   5.22 cm/s  LVOT diam:     2.00 cm   LV E/e' lateral: 31.8  LV SV:         111  LV SV Index:   78  LVOT Area:     3.14 cm      RIGHT VENTRICLE  RV S prime:     15.20 cm/s  TAPSE (M-mode): 3.0 cm   LEFT ATRIUM             Index        RIGHT ATRIUM           Index  LA diam:        4.70 cm 3.31 cm/m   RA Area:     18.10 cm  LA Vol (A2C):   81.7 ml 57.62 ml/m  RA Volume:   44.80 ml  31.60 ml/m  LA Vol (A4C):   93.2 ml 65.73 ml/m  LA Biplane Vol: 88.4 ml 62.35 ml/m   AORTIC VALVE  AV Area  (Vmax):    0.99 cm  AV Area (Vmean):   1.01 cm  AV Area (VTI):     1.12 cm  AV Vmax:           438.50 cm/s  AV Vmean:          329.500 cm/s  AV VTI:            0.992 m  AV Peak Grad:      76.9 mmHg  AV Mean Grad:      48.5 mmHg  LVOT Vmax:         138.00 cm/s  LVOT Vmean:        106.000 cm/s  LVOT VTI:          0.353 m  LVOT/AV VTI ratio: 0.36     AORTA  Ao Root diam: 2.80 cm   MITRAL VALVE                TRICUSPID VALVE  MV Area (PHT): 1.96 cm     TR Peak grad:   35.3 mmHg  MV Peak grad:  14.7 mmHg    TR Vmax:        297.00 cm/s  MV Mean grad:  6.0 mmHg  MV Vmax:       1.92 m/s     SHUNTS  MV Vmean:      105.0 cm/s   Systemic VTI:  0.35 m  MV Decel Time: 387 msec     Systemic Diam: 2.00 cm  MV E velocity: 166.00 cm/s  MV A velocity: 158.00 cm/s  MV E/A ratio:  1.05   CATH (03/2022) Conclusion  1.  Minimal obstructive coronary artery disease. 2.  Cardiac output of 8.1 L/min and cardiac index of 5.7 L/min/m with mean RA pressure 14 mmHg, mean wedge pressure of 27 mmHg, and PVR of 1.92 [place] units.      Recent Radiology Findings:    CTA (04/2022) FINDINGS: Aortic Valve: Tri leaflet AV calcified with score 2592   Aorta: No aneurysm moderate calcific atherosclerosis normal arch vessels   Sinotubular Junction: 22 mm   Ascending Thoracic Aorta: 29 mm   Aortic Arch: 28 mm   Descending Thoracic Aorta: 22 mm   Sinus of Valsalva Measurements:   Non-coronary: 24.3 mm   Right - coronary: 23.6 mm Height 19.0 mm   Left - coronary: 24.9 mmHeight 19.5 mm   Coronary Artery Height above Annulus:   Left Main: 13.4 mm above annulus   Right Coronary: 14.25 mm above annulus   Virtual Basal Annulus Measurements:   Maximum/Minimum Diameter: 19.4 mm x 20.5 mm   Perimeter: 68.14 mm   Area: 360.1 mm 2   Coronary Arteries: Sufficient height above annulus for deployment   Optimum Fluoroscopic Angle for Delivery: LAO 10 Caudal 17 degrees   IMPRESSION: 1. Tri  leaflet AV with calcium score 2592.   2. Annular area 360 mm2 suitable for a 23 mm Sapien 3 valve Sinuses are small for a 26 mm Medtronic   3. Optimum angiographic angle for deployment LAO 10 Caudal 17 degrees   4.  Coronary arteries sufficient height above annulus for deployment   5.  Membranous septal length 10.3 mm      Recent Lab Findings: Lab Results  Component Value Date   WBC 5.1 04/17/2022   HGB 9.3 (L) 04/17/2022   HCT 30.0 (L) 04/17/2022   PLT 271 04/17/2022   GLUCOSE 96 03/27/2022   CHOL 146 02/13/2022   TRIG 144 02/13/2022   HDL 50 02/13/2022   LDLCALC 71 02/13/2022   ALT 9 02/13/2022   AST 15 02/13/2022   NA 143 03/28/2022   K 3.9 03/28/2022   CL 108 (H) 03/27/2022   CREATININE 0.85 03/27/2022   BUN 11 03/27/2022   CO2 19 (L) 03/27/2022   TSH 1.460 02/13/2022   INR 1.1 03/13/2020      Assessment / Plan:            Haley Trevino 04/27/2022 11:19 AM

## 2022-04-28 ENCOUNTER — Encounter: Payer: Self-pay | Admitting: Thoracic Surgery (Cardiothoracic Vascular Surgery)

## 2022-04-28 ENCOUNTER — Institutional Professional Consult (permissible substitution) (INDEPENDENT_AMBULATORY_CARE_PROVIDER_SITE_OTHER): Payer: Medicare Other | Admitting: Thoracic Surgery (Cardiothoracic Vascular Surgery)

## 2022-04-28 DIAGNOSIS — I350 Nonrheumatic aortic (valve) stenosis: Secondary | ICD-10-CM | POA: Insufficient documentation

## 2022-04-29 ENCOUNTER — Ambulatory Visit: Payer: Medicare Other | Admitting: Physician Assistant

## 2022-04-30 ENCOUNTER — Other Ambulatory Visit: Payer: Self-pay

## 2022-04-30 DIAGNOSIS — I350 Nonrheumatic aortic (valve) stenosis: Secondary | ICD-10-CM

## 2022-05-02 ENCOUNTER — Inpatient Hospital Stay: Payer: Medicare Other

## 2022-05-02 VITALS — BP 106/46 | HR 69 | Temp 98.0°F | Resp 18

## 2022-05-02 DIAGNOSIS — D500 Iron deficiency anemia secondary to blood loss (chronic): Secondary | ICD-10-CM | POA: Diagnosis not present

## 2022-05-02 DIAGNOSIS — K922 Gastrointestinal hemorrhage, unspecified: Secondary | ICD-10-CM | POA: Diagnosis not present

## 2022-05-02 MED ORDER — SODIUM CHLORIDE 0.9 % IV SOLN: Freq: Once | INTRAVENOUS | Status: AC

## 2022-05-02 MED ORDER — SODIUM CHLORIDE 0.9 % IV SOLN
300.0000 mg | Freq: Once | INTRAVENOUS | Status: AC
  Administered 2022-05-02: 300 mg via INTRAVENOUS
  Filled 2022-05-02: qty 300

## 2022-05-02 MED ORDER — LORATADINE 10 MG PO TABS
10.0000 mg | ORAL_TABLET | Freq: Once | ORAL | Status: AC
  Administered 2022-05-02: 10 mg via ORAL
  Filled 2022-05-02: qty 1

## 2022-05-02 MED ORDER — ACETAMINOPHEN 325 MG PO TABS
650.0000 mg | ORAL_TABLET | Freq: Once | ORAL | Status: AC
  Administered 2022-05-02: 650 mg via ORAL
  Filled 2022-05-02: qty 2

## 2022-05-02 NOTE — Progress Notes (Signed)
Pt presents today for Venofer IV iron infusion per provider's order. Vital signs stable and pt voiced no new complaints at this time.  Peripheral IV started with good blood return pre and post infusion.  Venofer 300 mg  given today per MD orders. Tolerated infusion without adverse affects. Vital signs stable. No complaints at this time. [name] Alert and oriented x 3. F/U with [facility] as scheduled.   

## 2022-05-02 NOTE — Patient Instructions (Signed)
[facility]  Discharge Instructions: Thank you for choosing [facility] to provide your oncology and hematology care.  If you have a lab appointment with the Cancer Center, please come in thru the Main Entrance and check in at the main information desk.  Wear comfortable clothing and clothing appropriate for easy access to any Portacath or PICC line.   We strive to give you quality time with your provider. You may need to reschedule your appointment if you arrive late (15 or more minutes).  Arriving late affects you and other patients whose appointments are after yours.  Also, if you miss three or more appointments without notifying the office, you may be dismissed from the clinic at the provider's discretion.      For prescription refill requests, have your pharmacy contact our office and allow 72 hours for refills to be completed.    Today you received Venofer IV iron infusion.     BELOW ARE SYMPTOMS THAT SHOULD BE REPORTED IMMEDIATELY: *FEVER GREATER THAN 100.4 F (38 C) OR HIGHER *CHILLS OR SWEATING *NAUSEA AND VOMITING THAT IS NOT CONTROLLED WITH YOUR NAUSEA MEDICATION *UNUSUAL SHORTNESS OF BREATH *UNUSUAL BRUISING OR BLEEDING *URINARY PROBLEMS (pain or burning when urinating, or frequent urination) *BOWEL PROBLEMS (unusual diarrhea, constipation, pain near the anus) TENDERNESS IN MOUTH AND THROAT WITH OR WITHOUT PRESENCE OF ULCERS (sore throat, sores in mouth, or a toothache) UNUSUAL RASH, SWELLING OR PAIN  UNUSUAL VAGINAL DISCHARGE OR ITCHING   Items with * indicate a potential emergency and should be followed up as soon as possible or go to the Emergency Department if any problems should occur.  Please show the CHEMOTHERAPY ALERT CARD or IMMUNOTHERAPY ALERT CARD at check-in to the Emergency Department and triage nurse.  Should you have questions after your visit or need to cancel or reschedule your appointment, please contact [facility] [phone]  and follow the prompts.  Office hours are 8:00 a.m. to 4:30 p.m. Monday - Friday. Please note that voicemails left after 4:00 p.m. may not be returned until the following business day.  We are closed weekends and major holidays. You have access to a nurse at all times for urgent questions. Please call the main number to the clinic [phone] and follow the prompts.  For any non-urgent questions, you may also contact your provider using MyChart. We now offer e-Visits for anyone 18 and older to request care online for non-urgent symptoms. For details visit [email or web address].   Also download the MyChart app! Go to the app store, search "MyChart", open the app, select [place] [facility], and log in with your MyChart username and password.  Masks are optional in the cancer centers. If you would like for your care team to wear a mask while they are taking care of you, please let them know. You may have one support person who is at least 80 years old accompany you for your appointments.  

## 2022-05-05 ENCOUNTER — Ambulatory Visit (INDEPENDENT_AMBULATORY_CARE_PROVIDER_SITE_OTHER): Payer: Medicare Other

## 2022-05-05 ENCOUNTER — Telehealth: Payer: Self-pay | Admitting: Nurse Practitioner

## 2022-05-05 DIAGNOSIS — Z23 Encounter for immunization: Secondary | ICD-10-CM

## 2022-05-08 MED FILL — Iron Sucrose Inj 20 MG/ML (Fe Equiv): INTRAVENOUS | Qty: 15 | Status: AC

## 2022-05-09 ENCOUNTER — Inpatient Hospital Stay: Payer: Medicare Other | Attending: Hematology

## 2022-05-09 VITALS — BP 109/37 | HR 73 | Temp 98.2°F | Resp 17

## 2022-05-09 DIAGNOSIS — K922 Gastrointestinal hemorrhage, unspecified: Secondary | ICD-10-CM | POA: Insufficient documentation

## 2022-05-09 DIAGNOSIS — D500 Iron deficiency anemia secondary to blood loss (chronic): Secondary | ICD-10-CM | POA: Insufficient documentation

## 2022-05-09 MED ORDER — SODIUM CHLORIDE 0.9 % IV SOLN: Freq: Once | INTRAVENOUS | Status: AC

## 2022-05-09 MED ORDER — SODIUM CHLORIDE 0.9 % IV SOLN
300.0000 mg | Freq: Once | INTRAVENOUS | Status: AC
  Administered 2022-05-09: 300 mg via INTRAVENOUS
  Filled 2022-05-09: qty 300

## 2022-05-09 MED ORDER — ACETAMINOPHEN 325 MG PO TABS
650.0000 mg | ORAL_TABLET | Freq: Once | ORAL | Status: AC
  Administered 2022-05-09: 650 mg via ORAL
  Filled 2022-05-09: qty 2

## 2022-05-09 MED ORDER — LORATADINE 10 MG PO TABS
10.0000 mg | ORAL_TABLET | Freq: Once | ORAL | Status: AC
  Administered 2022-05-09: 10 mg via ORAL
  Filled 2022-05-09: qty 1

## 2022-05-09 NOTE — Progress Notes (Signed)
Patient tolerated iron infusion with no complaints voiced.  Peripheral IV site clean and dry with good blood return noted before and after infusion.  Band aid applied.  VSS with discharge and left in satisfactory condition with no s/s of distress noted.   

## 2022-05-09 NOTE — Patient Instructions (Signed)
[facility]  Discharge Instructions: Thank you for choosing [facility] to provide your oncology and hematology care.  If you have a lab appointment with the Cancer Center, please come in thru the Main Entrance and check in at the main information desk.  Wear comfortable clothing and clothing appropriate for easy access to any Portacath or PICC line.   We strive to give you quality time with your provider. You may need to reschedule your appointment if you arrive late (15 or more minutes).  Arriving late affects you and other patients whose appointments are after yours.  Also, if you miss three or more appointments without notifying the office, you may be dismissed from the clinic at the provider's discretion.      For prescription refill requests, have your pharmacy contact our office and allow 72 hours for refills to be completed.     To help prevent nausea and vomiting after your treatment, we encourage you to take your nausea medication as directed.  BELOW ARE SYMPTOMS THAT SHOULD BE REPORTED IMMEDIATELY: *FEVER GREATER THAN 100.4 F (38 C) OR HIGHER *CHILLS OR SWEATING *NAUSEA AND VOMITING THAT IS NOT CONTROLLED WITH YOUR NAUSEA MEDICATION *UNUSUAL SHORTNESS OF BREATH *UNUSUAL BRUISING OR BLEEDING *URINARY PROBLEMS (pain or burning when urinating, or frequent urination) *BOWEL PROBLEMS (unusual diarrhea, constipation, pain near the anus) TENDERNESS IN MOUTH AND THROAT WITH OR WITHOUT PRESENCE OF ULCERS (sore throat, sores in mouth, or a toothache) UNUSUAL RASH, SWELLING OR PAIN  UNUSUAL VAGINAL DISCHARGE OR ITCHING   Items with * indicate a potential emergency and should be followed up as soon as possible or go to the Emergency Department if any problems should occur.  Please show the CHEMOTHERAPY ALERT CARD or IMMUNOTHERAPY ALERT CARD at check-in to the Emergency Department and triage nurse.  Should you have questions after your visit or need to  cancel or reschedule your appointment, please contact [facility] [phone]  and follow the prompts.  Office hours are 8:00 a.m. to 4:30 p.m. Monday - Friday. Please note that voicemails left after 4:00 p.m. may not be returned until the following business day.  We are closed weekends and major holidays. You have access to a nurse at all times for urgent questions. Please call the main number to the clinic [phone] and follow the prompts.  For any non-urgent questions, you may also contact your provider using MyChart. We now offer e-Visits for anyone 18 and older to request care online for non-urgent symptoms. For details visit [email or web address].   Also download the MyChart app! Go to the app store, search "MyChart", open the app, select [place], and log in with your MyChart username and password.  Masks are optional in the cancer centers. If you would like for your care team to wear a mask while they are taking care of you, please let them know. You may have one support person who is at least 80 years old accompany you for your appointments.  

## 2022-05-12 ENCOUNTER — Encounter (HOSPITAL_COMMUNITY)
Admission: RE | Admit: 2022-05-12 | Discharge: 2022-05-12 | Disposition: A | Discharge status: Home / Self Care | Payer: Medicare Other | Source: Ambulatory Visit | Attending: Cardiovascular Disease | Admitting: Cardiovascular Disease

## 2022-05-12 ENCOUNTER — Ambulatory Visit (HOSPITAL_COMMUNITY)
Admission: RE | Admit: 2022-05-12 | Discharge: 2022-05-12 | Disposition: A | Discharge status: Home / Self Care | Payer: Medicare Other | Source: Ambulatory Visit | Attending: Cardiovascular Disease | Admitting: Cardiovascular Disease

## 2022-05-12 DIAGNOSIS — E039 Hypothyroidism, unspecified: Secondary | ICD-10-CM | POA: Diagnosis not present

## 2022-05-12 DIAGNOSIS — E871 Hypo-osmolality and hyponatremia: Secondary | ICD-10-CM | POA: Diagnosis not present

## 2022-05-12 DIAGNOSIS — J90 Pleural effusion, not elsewhere classified: Secondary | ICD-10-CM | POA: Diagnosis not present

## 2022-05-12 DIAGNOSIS — E785 Hyperlipidemia, unspecified: Secondary | ICD-10-CM | POA: Diagnosis not present

## 2022-05-12 DIAGNOSIS — R1110 Vomiting, unspecified: Secondary | ICD-10-CM | POA: Diagnosis not present

## 2022-05-12 DIAGNOSIS — J811 Chronic pulmonary edema: Secondary | ICD-10-CM | POA: Diagnosis not present

## 2022-05-12 DIAGNOSIS — R0603 Acute respiratory distress: Secondary | ICD-10-CM | POA: Diagnosis not present

## 2022-05-12 DIAGNOSIS — K219 Gastro-esophageal reflux disease without esophagitis: Secondary | ICD-10-CM | POA: Diagnosis present

## 2022-05-12 DIAGNOSIS — I350 Nonrheumatic aortic (valve) stenosis: Secondary | ICD-10-CM | POA: Diagnosis not present

## 2022-05-12 DIAGNOSIS — Z20822 Contact with and (suspected) exposure to covid-19: Secondary | ICD-10-CM | POA: Diagnosis not present

## 2022-05-12 DIAGNOSIS — I10 Essential (primary) hypertension: Secondary | ICD-10-CM | POA: Diagnosis not present

## 2022-05-12 DIAGNOSIS — J9811 Atelectasis: Secondary | ICD-10-CM | POA: Diagnosis not present

## 2022-05-12 DIAGNOSIS — I5033 Acute on chronic diastolic (congestive) heart failure: Secondary | ICD-10-CM | POA: Diagnosis not present

## 2022-05-12 DIAGNOSIS — Z006 Encounter for examination for normal comparison and control in clinical research program: Secondary | ICD-10-CM | POA: Diagnosis not present

## 2022-05-12 DIAGNOSIS — Z79899 Other long term (current) drug therapy: Secondary | ICD-10-CM | POA: Diagnosis not present

## 2022-05-12 DIAGNOSIS — I050 Rheumatic mitral stenosis: Secondary | ICD-10-CM | POA: Diagnosis not present

## 2022-05-12 DIAGNOSIS — M1990 Unspecified osteoarthritis, unspecified site: Secondary | ICD-10-CM | POA: Diagnosis not present

## 2022-05-12 DIAGNOSIS — F418 Other specified anxiety disorders: Secondary | ICD-10-CM | POA: Diagnosis not present

## 2022-05-12 DIAGNOSIS — F32A Depression, unspecified: Secondary | ICD-10-CM | POA: Diagnosis present

## 2022-05-12 DIAGNOSIS — I442 Atrioventricular block, complete: Secondary | ICD-10-CM | POA: Diagnosis not present

## 2022-05-12 DIAGNOSIS — I509 Heart failure, unspecified: Secondary | ICD-10-CM | POA: Diagnosis not present

## 2022-05-12 DIAGNOSIS — I4892 Unspecified atrial flutter: Secondary | ICD-10-CM | POA: Diagnosis not present

## 2022-05-12 DIAGNOSIS — Z1152 Encounter for screening for COVID-19: Secondary | ICD-10-CM | POA: Insufficient documentation

## 2022-05-12 DIAGNOSIS — M349 Systemic sclerosis, unspecified: Secondary | ICD-10-CM | POA: Diagnosis not present

## 2022-05-12 DIAGNOSIS — D638 Anemia in other chronic diseases classified elsewhere: Secondary | ICD-10-CM | POA: Diagnosis not present

## 2022-05-12 DIAGNOSIS — Z881 Allergy status to other antibiotic agents status: Secondary | ICD-10-CM | POA: Diagnosis not present

## 2022-05-12 DIAGNOSIS — D696 Thrombocytopenia, unspecified: Secondary | ICD-10-CM | POA: Diagnosis not present

## 2022-05-12 DIAGNOSIS — Z01818 Encounter for other preprocedural examination: Secondary | ICD-10-CM | POA: Diagnosis not present

## 2022-05-12 DIAGNOSIS — R0602 Shortness of breath: Secondary | ICD-10-CM | POA: Diagnosis not present

## 2022-05-12 DIAGNOSIS — J690 Pneumonitis due to inhalation of food and vomit: Secondary | ICD-10-CM | POA: Diagnosis not present

## 2022-05-12 DIAGNOSIS — I272 Pulmonary hypertension, unspecified: Secondary | ICD-10-CM | POA: Diagnosis not present

## 2022-05-12 DIAGNOSIS — E875 Hyperkalemia: Secondary | ICD-10-CM | POA: Diagnosis not present

## 2022-05-12 DIAGNOSIS — Z952 Presence of prosthetic heart valve: Secondary | ICD-10-CM | POA: Diagnosis not present

## 2022-05-12 DIAGNOSIS — J9601 Acute respiratory failure with hypoxia: Secondary | ICD-10-CM | POA: Diagnosis not present

## 2022-05-12 DIAGNOSIS — I110 Hypertensive heart disease with heart failure: Secondary | ICD-10-CM | POA: Diagnosis not present

## 2022-05-12 DIAGNOSIS — R0902 Hypoxemia: Secondary | ICD-10-CM | POA: Diagnosis not present

## 2022-05-12 DIAGNOSIS — D509 Iron deficiency anemia, unspecified: Secondary | ICD-10-CM | POA: Diagnosis not present

## 2022-05-12 DIAGNOSIS — E876 Hypokalemia: Secondary | ICD-10-CM | POA: Diagnosis not present

## 2022-05-12 DIAGNOSIS — N179 Acute kidney failure, unspecified: Secondary | ICD-10-CM | POA: Diagnosis not present

## 2022-05-12 LAB — COMPREHENSIVE METABOLIC PANEL
ALT: 12 U/L (ref 0–44)
AST: 20 U/L (ref 15–41)
Albumin: 3.5 g/dL (ref 3.5–5.0)
Alkaline Phosphatase: 42 U/L (ref 38–126)
Anion gap: 7 (ref 5–15)
BUN: 9 mg/dL (ref 8–23)
CO2: 23 mmol/L (ref 22–32)
Calcium: 8.7 mg/dL — ABNORMAL LOW (ref 8.9–10.3)
Chloride: 109 mmol/L (ref 98–111)
Creatinine, Ser: 0.9 mg/dL (ref 0.44–1.00)
GFR, Estimated: >60 mL/min (ref >60)
Glucose, Bld: 100 mg/dL — ABNORMAL HIGH (ref 70–99)
Potassium: 3.2 mmol/L — ABNORMAL LOW (ref 3.5–5.1)
Sodium: 139 mmol/L (ref 135–145)
Total Bilirubin: 0.4 mg/dL (ref 0.3–1.2)
Total Protein: 6.7 g/dL (ref 6.5–8.1)

## 2022-05-12 LAB — SARS CORONAVIRUS 2 (TAT 6-24 HRS): SARS Coronavirus 2: NEGATIVE

## 2022-05-12 LAB — URINALYSIS, ROUTINE W REFLEX MICROSCOPIC
Bilirubin Urine: NEGATIVE
Glucose, UA: NEGATIVE mg/dL
Hgb urine dipstick: NEGATIVE
Ketones, ur: NEGATIVE mg/dL
Leukocytes,Ua: NEGATIVE
Nitrite: NEGATIVE
Protein, ur: NEGATIVE mg/dL
Specific Gravity, Urine: 1.016 (ref 1.005–1.030)
pH: 5 (ref 5.0–8.0)

## 2022-05-12 LAB — CBC
HCT: 28.3 % — ABNORMAL LOW (ref 36.0–46.0)
Hemoglobin: 8.6 g/dL — ABNORMAL LOW (ref 12.0–15.0)
MCH: 29.7 pg (ref 26.0–34.0)
MCHC: 30.4 g/dL (ref 30.0–36.0)
MCV: 97.6 fL (ref 80.0–100.0)
Platelets: 297 10*3/uL (ref 150–400)
RBC: 2.9 MIL/uL — ABNORMAL LOW (ref 3.87–5.11)
RDW: 16.3 % — ABNORMAL HIGH (ref 11.5–15.5)
WBC: 6.9 10*3/uL (ref 4.0–10.5)
nRBC: 0 % (ref 0.0–0.2)

## 2022-05-12 LAB — PROTIME-INR
INR: 1 (ref 0.8–1.2)
Prothrombin Time: 13.3 seconds (ref 11.4–15.2)

## 2022-05-12 LAB — SURGICAL PCR SCREEN
MRSA, PCR: NEGATIVE
Staphylococcus aureus: NEGATIVE

## 2022-05-12 MED ORDER — DEXMEDETOMIDINE HCL IN NACL 400 MCG/100ML IV SOLN
0.1000 ug/kg/h | INTRAVENOUS | Status: DC
  Filled 2022-05-12 (×2): qty 100

## 2022-05-12 MED ORDER — CEFAZOLIN SODIUM-DEXTROSE 2-4 GM/100ML-% IV SOLN
2.0000 g | INTRAVENOUS | Status: AC
  Administered 2022-05-13: 2 g via INTRAVENOUS
  Filled 2022-05-12 (×2): qty 100

## 2022-05-12 MED ORDER — POTASSIUM CHLORIDE 2 MEQ/ML IV SOLN
80.0000 meq | INTRAVENOUS | Status: DC
  Filled 2022-05-12 (×2): qty 40

## 2022-05-12 MED ORDER — HEPARIN 30,000 UNITS/1000 ML (OHS) CELLSAVER SOLUTION
Status: DC
  Filled 2022-05-12 (×2): qty 1000

## 2022-05-12 MED ORDER — MAGNESIUM SULFATE 50 % IJ SOLN
40.0000 meq | INTRAMUSCULAR | Status: DC
  Filled 2022-05-12 (×2): qty 9.85

## 2022-05-12 MED ORDER — NOREPINEPHRINE 4 MG/250ML-% IV SOLN
0.0000 ug/min | INTRAVENOUS | Status: DC
  Filled 2022-05-12: qty 250

## 2022-05-12 NOTE — Progress Notes (Addendum)
All surgical and blood consents signed at [facility] Lab Appointment. CHG soap and instructions given to patient. CHG surgical prep instructions reviewed with patient and husband. All questions answered.  Theodosia Quay, RN with TAVR Team made aware of following abnormal labs:  Hbg 8.6

## 2022-05-12 NOTE — H&P (Signed)
[address]       [place] [address]             [phone]                                   Shayli C Yanko [place] Medical Record #[id number] Date of Birth: 1940-09-18   [name], MD [name], FNP   Chief Complaint:   Aortic stenosis   History of Present Illness:     Pt is a pleasant 81 yo female that has been followed for her aortic stenosis for some time. She has been getting significantly more dyspneic the past year and to the point she cannot perform her daily activities without becoming SOB and needing to rest. She occasionally gets lightheaded and felt that her fall several weeks ago may have been related to getting out of bed too quickly. She has chest discomfort that is not exertional but what she feels is related to her breast scar from removed implants. She has had a very extensive GI work up for blood loss anemia without an etiology and has been on iron infusions the past year for therapy. She only had one blood transfusion. She does feel she is having hemrrhoid issues  currently. Her work up had included an echo (see below) with an aortic valve area of 1.1 cm grade, mean gradient of 48 mmHg, and peak velocity 4.38 m/s; ejection fraction of 65 to 70%; no conduction abnormalities on her EKG. She was felt to be best suited for TAVR and has had her cath (without siginifcant CAD) and TAVR CTA that has acceptable anatomy for TAVR.             Past Medical History:  Diagnosis Date   Cataract     Depression     GERD (gastroesophageal reflux disease)     Hyperlipidemia     Hypertension     Hypothyroidism     IBS (irritable bowel syndrome)     Osteoarthritis     Raynaud disease     Scleroderma ([place])     Thyroid disease      hypothyroidism           Past Surgical History:  Procedure Laterality Date   BREAST ENHANCEMENT SURGERY   1975   BREAST IMPLANT REMOVAL Bilateral 04/23/2016    Procedure: REMOVALBILATERAL BREAST  IMPLANTS;  Surgeon: Wallace Going, DO;  Location: [facility];  Service: Plastics;  Laterality: Bilateral;   CHOLECYSTECTOMY   1973   ESOPHAGOGASTRODUODENOSCOPY N/A 10/25/2020    Procedure: ESOPHAGOGASTRODUODENOSCOPY (EGD);  Surgeon: Clarene Essex, MD;  Location: [facility];  Service: Endoscopy;  Laterality: N/A;  enteroscopy   HAND SURGERY   08/2012; 11/2012   HOT HEMOSTASIS N/A 10/25/2020    Procedure: HOT HEMOSTASIS (ARGON PLASMA COAGULATION/BICAP);  Surgeon: Clarene Essex, MD;  Location: [facility];  Service: Endoscopy;  Laterality: N/A;   IR RADIOLOGIST EVAL & MGMT   07/27/2017   IR SACROPLASTY BILATERAL   07/31/2017   IR VERTEBROPLASTY CERV/THOR BX INC UNI/BIL INC/INJECT/IMAGING   03/13/2020   IR VERTEBROPLASTY EA ADDL (T&LS) BX INC UNI/BIL INC INJECT/IMAGING   03/14/2020   MELANOMA EXCISION        at 31 yrs of age   ORIF HIP FRACTURE Right 02/22/2014    Procedure: OPEN REDUCTION INTERNAL FIXATION RIGHT HIP;  Surgeon: Sanjuana Kava, MD;  Location: [facility];  Service: Orthopedics;  Laterality: Right;   RIGHT HEART CATH AND CORONARY ANGIOGRAPHY N/A 03/28/2022    Procedure: RIGHT HEART CATH AND CORONARY ANGIOGRAPHY;  Surgeon: Early Osmond, MD;  Location: [facility];  Service: Cardiovascular;  Laterality: N/A;   TOTAL ABDOMINAL HYSTERECTOMY   1974      Social History        Tobacco Use  Smoking Status Never  Smokeless Tobacco Never  Tobacco Comments    passive tobacco smoke exposure as child    Social History       Substance and Sexual Activity  Alcohol Use No      Social History         Socioeconomic History   Marital status: Married      Spouse name: Not on file   Number of children: 2   Years of education: Not on file   Highest education level: Some college, no degree  Occupational History   Occupation: retired Occupational psychologist at [name]  Tobacco Use   Smoking status: Never   Smokeless tobacco: Never   Tobacco comments:      passive tobacco  smoke exposure as child  Vaping Use   Vaping Use: Never used  Substance and Sexual Activity   Alcohol use: No   Drug use: No   Sexual activity: Not Currently      Birth control/protection: Surgical  Other Topics Concern   Not on file  Social History Narrative    Regular exercise: walkingCaffeine use: 1 cup of coffee daily    Lives in split level home with her husband    Social Determinants of Health        Financial Resource Strain: [place]  (04/29/2021)    Overall Financial Resource Strain (CARDIA)     Difficulty of Paying Living Expenses: Not hard at all  Food Insecurity: No Food Insecurity (04/29/2021)    Hunger Vital Sign     Worried About Running Out of Food in the Last Year: Never true     [place] in the Last Year: Never true  Transportation Needs: No Transportation Needs (04/29/2021)    PRAPARE - Armed forces logistics/support/administrative officer (Medical): No     Lack of Transportation (Non-Medical): No  Physical Activity: Inactive (06/14/2021)    Exercise Vital Sign     Days of Exercise per Week: 0 days     Minutes of Exercise per Session: 0 min  Stress: Stress Concern Present (06/14/2021)    [place]     Feeling of Stress : Rather much  Social Connections: Socially Integrated (04/29/2021)    Social Connection and Isolation Panel [NHANES]     Frequency of Communication with Friends and Family: More than three times a week     Frequency of Social Gatherings with Friends and Family: Once a week     Attends Religious Services: 1 to 4 times per year     Active Member of [employer] or Organizations: Yes     Attends Archivist Meetings: 1 to 4 times per year     Marital Status: Married  Human resources officer Violence: Not At Risk (04/29/2021)    Humiliation, Afraid, Rape, and Kick questionnaire     Fear of Current or Ex-Partner: No     Emotionally Abused: No     Physically Abused: No     Sexually  Abused: No  Allergies  Allergen Reactions   Bactrim [Sulfamethoxazole-Trimethoprim] Nausea And Vomiting   Dilaudid [Hydromorphone Hcl] Anaphylaxis   Dilaudid [Hydromorphone] Anaphylaxis   Acyclovir And Related        Unknown reaction   Crestor [Rosuvastatin Calcium] Other (See Comments)      Muscle pain   Gabapentin Swelling      Swelling in feet, and in legs   Aleve [Naproxen Sodium] Rash   Lyrica [Pregabalin] Other (See Comments)      Swelling in feet, and in legs            Current Outpatient Medications  Medication Sig Dispense Refill   acetaminophen (TYLENOL) 500 MG tablet Take 1,000 mg by mouth every 6 (six) hours as needed for moderate pain or headache.       CALCIUM PO Take by mouth.       Cholecalciferol (VITAMIN D) 50 MCG (2000 UT) tablet Take 2,000 Units by mouth daily.       clonazePAM (KLONOPIN) 0.5 MG tablet Take 1 tablet (0.5 mg total) by mouth 2 (two) times daily as needed for anxiety. 180 tablet 1   ezetimibe (ZETIA) 10 MG tablet Take 1 tablet (10 mg total) by mouth daily. 90 tablet 1   FLUoxetine (PROZAC) 40 MG capsule Take 1 capsule (40 mg total) by mouth daily. 90 capsule 1   fluticasone (FLONASE) 50 MCG/ACT nasal spray PLACE 2 SPRAYS INTO BOTH NOSTRILS DAILY 16 g 6   furosemide (LASIX) 20 MG tablet Take 1 tablet (20 mg total) by mouth daily. 90 tablet 3   Multiple Vitamins-Minerals (PRESERVISION/LUTEIN PO) Take by mouth.       NIFEdipine (PROCARDIA XL/NIFEDICAL XL) 60 MG 24 hr tablet Take 1 tablet (60 mg total) by mouth daily. 90 tablet 1   omeprazole (PRILOSEC) 20 MG capsule TAKE 1 TABLET DAILY 90 capsule 1   PROLIA 60 MG/ML SOSY injection Inject 60 mg into the skin every 6 (six) months. 180 mL 0   RESTASIS 0.05 % ophthalmic emulsion 1 drop 2 (two) times daily.       sildenafil (REVATIO) 20 MG tablet Take 1 tablet (20 mg total) by mouth 2 (two) times daily. 180 tablet 1   SYNTHROID 75 MCG tablet TAKE (1) TABLET DAILY BE- FORE BREAKFAST. 90 tablet 1     No current facility-administered medications for this visit.             Family History  Problem Relation Age of Onset   Pancreatic cancer Father     Raynaud syndrome Father     Cancer Father          pancreatic   Rashes / Skin problems Daughter          possibly scleraderma   Hip fracture Mother     Mental illness Mother          attempted suicide at 55 yo        Physical Exam: There were no vitals taken for this visit. EXAM  LUNGS: overall clear  CARD: rr with a harsh 3/6 sem   EXT: no edema       Diagnostic Studies & Laboratory data: I have personally reviewed the following studies and agree with the findings    TTE (03/2022)  IMPRESSIONS    1. The aortic valve is tricuspid. There is severe calcifcation of the  aortic valve. There is severe thickening of the aortic valve. Aortic valve  regurgitation is trivial. Likely severe aortic valve stenosis with aortic  valve mean gradient of 48.5 mmHg  and Vmax 4.38 m/s. The patient is also hyperdynamic which contributes to  the elevated gradients. The aortic valve area and DI calculate in the  moderate range with AVA 1.1cm2, DI 0.35 (LVOT VTI 35). Visually, however,  the valve appears severely stenosed.   2. Left ventricular ejection fraction, by estimation, is >75%. The left  ventricle has hyperdynamic function. The left ventricle has no regional  wall motion abnormalities. There is moderate concentric left ventricular  hypertrophy. Left ventricular  diastolic parameters are consistent with Grade II diastolic dysfunction  (pseudonormalization).   3. Right ventricular systolic function is normal. The right ventricular  size is normal. There is mildly elevated pulmonary artery systolic  pressure.   4. Left atrial size was severely dilated.   5. Right atrial size was mildly dilated.   6. The mitral valve is degenerative. Trivial mitral valve regurgitation.  Mild to moderate mitral stenosis. Severe mitral annular  calcification. MVA  by PHT is 1.9cm2 and by continuity is 1.75cm2. Mean gradient is 76mHg at  HR 70bpm.   7. The inferior vena cava is normal in size with greater than 50%  respiratory variability, suggesting right atrial pressure of 3 mmHg.   Comparison(s): Compared to prior TTE report in 2019, the AS has progressed  from mild to severe and there is now mild-to-moderate calcific MS.   FINDINGS   Left Ventricle: Left ventricular ejection fraction, by estimation, is  >75%. The left ventricle has hyperdynamic function. The left ventricle has  no regional wall motion abnormalities. The left ventricular internal  cavity size was normal in size. There  is moderate concentric left ventricular hypertrophy. Left ventricular  diastolic parameters are consistent with Grade II diastolic dysfunction  (pseudonormalization).   Right Ventricle: The right ventricular size is normal. No increase in  right ventricular wall thickness. Right ventricular systolic function is  normal. There is mildly elevated pulmonary artery systolic pressure. The  tricuspid regurgitant velocity is 2.97   m/s, and with an assumed right atrial pressure of 3 mmHg, the estimated  right ventricular systolic pressure is 332.9mmHg.   Left Atrium: Left atrial size was severely dilated.   Right Atrium: Right atrial size was mildly dilated.   Pericardium: There is no evidence of pericardial effusion.   Mitral Valve: The mitral valve is degenerative in appearance. Severe  mitral annular calcification. Trivial mitral valve regurgitation. Mild to  moderate mitral valve stenosis. MV peak gradient, 14.7 mmHg. The mean  mitral valve gradient is 6.0 mmHg. MVA by   PHT 1.9cm2 and 1.75cm2 by continuity.     Tricuspid Valve: The tricuspid valve is normal in structure. Tricuspid  valve regurgitation is trivial.   Aortic Valve: The aortic valve is tricuspid. There is severe calcifcation  of the aortic valve. There is severe  thickening of the aortic valve.  Aortic valve regurgitation is trivial. Severe aortic stenosis is present.  Aortic valve mean gradient measures  48.5 mmHg. Aortic valve peak gradient measures 76.9 mmHg. Aortic valve  area, by VTI measures 1.12 cm.   Pulmonic Valve: The pulmonic valve was normal in structure. Pulmonic valve  regurgitation is trivial.   Aorta: The aortic root is normal in size and structure.   Venous: The inferior vena cava is normal in size with greater than 50%  respiratory variability, suggesting right atrial pressure of 3 mmHg.   IAS/Shunts: The atrial septum is grossly normal.      LEFT VENTRICLE  PLAX  2D  LVIDd:         4.00 cm   Diastology  LVIDs:         1.90 cm   LV e' medial:    3.70 cm/s  LV PW:         1.20 cm   LV E/e' medial:  44.9  LV IVS:        1.20 cm   LV e' lateral:   5.22 cm/s  LVOT diam:     2.00 cm   LV E/e' lateral: 31.8  LV SV:         111  LV SV Index:   78  LVOT Area:     3.14 cm      RIGHT VENTRICLE  RV S prime:     15.20 cm/s  TAPSE (M-mode): 3.0 cm   LEFT ATRIUM             Index        RIGHT ATRIUM           Index  LA diam:        4.70 cm 3.31 cm/m   RA Area:     18.10 cm  LA Vol (A2C):   81.7 ml 57.62 ml/m  RA Volume:   44.80 ml  31.60 ml/m  LA Vol (A4C):   93.2 ml 65.73 ml/m  LA Biplane Vol: 88.4 ml 62.35 ml/m   AORTIC VALVE  AV Area (Vmax):    0.99 cm  AV Area (Vmean):   1.01 cm  AV Area (VTI):     1.12 cm  AV Vmax:           438.50 cm/s  AV Vmean:          329.500 cm/s  AV VTI:            0.992 m  AV Peak Grad:      76.9 mmHg  AV Mean Grad:      48.5 mmHg  LVOT Vmax:         138.00 cm/s  LVOT Vmean:        106.000 cm/s  LVOT VTI:          0.353 m  LVOT/AV VTI ratio: 0.36     AORTA  Ao Root diam: 2.80 cm   MITRAL VALVE                TRICUSPID VALVE  MV Area (PHT): 1.96 cm     TR Peak grad:   35.3 mmHg  MV Peak grad:  14.7 mmHg    TR Vmax:        297.00 cm/s  MV Mean grad:  6.0 mmHg  MV Vmax:        1.92 m/s     SHUNTS  MV Vmean:      105.0 cm/s   Systemic VTI:  0.35 m  MV Decel Time: 387 msec     Systemic Diam: 2.00 cm  MV E velocity: 166.00 cm/s  MV A velocity: 158.00 cm/s  MV E/A ratio:  1.05   CATH (03/2022) Conclusion   1.  Minimal obstructive coronary artery disease. 2.  Cardiac output of 8.1 L/min and cardiac index of 5.7 L/min/m with mean RA pressure 14 mmHg, mean wedge pressure of 27 mmHg, and PVR of 1.92 [place] units.       Recent Radiology Findings:    CTA (04/2022) FINDINGS: Aortic Valve: Tri leaflet AV calcified with score 2592   Aorta:  No aneurysm moderate calcific atherosclerosis normal arch vessels   Sinotubular Junction: 22 mm   Ascending Thoracic Aorta: 29 mm   Aortic Arch: 28 mm   Descending Thoracic Aorta: 22 mm   Sinus of Valsalva Measurements:   Non-coronary: 24.3 mm   Right - coronary: 23.6 mm Height 19.0 mm   Left - coronary: 24.9 mmHeight 19.5 mm   Coronary Artery Height above Annulus:   Left Main: 13.4 mm above annulus   Right Coronary: 14.25 mm above annulus   Virtual Basal Annulus Measurements:   Maximum/Minimum Diameter: 19.4 mm x 20.5 mm   Perimeter: 68.14 mm   Area: 360.1 mm 2   Coronary Arteries: Sufficient height above annulus for deployment   Optimum Fluoroscopic Angle for Delivery: LAO 10 Caudal 17 degrees   IMPRESSION: 1. Tri leaflet AV with calcium score 2592.   2. Annular area 360 mm2 suitable for a 23 mm Sapien 3 valve Sinuses are small for a 26 mm Medtronic   3. Optimum angiographic angle for deployment LAO 10 Caudal 17 degrees   4.  Coronary arteries sufficient height above annulus for deployment   5.  Membranous septal length 10.3 mm       Recent Lab Findings: Recent Labs       Lab Results  Component Value Date    WBC 5.1 04/17/2022    HGB 9.3 (L) 04/17/2022    HCT 30.0 (L) 04/17/2022    PLT 271 04/17/2022    GLUCOSE 96 03/27/2022    CHOL 146 02/13/2022    TRIG 144 02/13/2022    HDL 50  02/13/2022    LDLCALC 71 02/13/2022    ALT 9 02/13/2022    AST 15 02/13/2022    NA 143 03/28/2022    K 3.9 03/28/2022    CL 108 (H) 03/27/2022    CREATININE 0.85 03/27/2022    BUN 11 03/27/2022    CO2 19 (L) 03/27/2022    TSH 1.460 02/13/2022    INR 1.1 03/13/2020            Assessment / Plan:     Severe AS  Pt with NYHA class III symptoms due to her severe AS and has a class I indication for AVR. With her age, comorbidities I feel she is best suited for TAVR. We spent time discussing the issues and risks and benefits and she and her family agree and wish to proceed. I feel if complications arise requiring surgical rescue she would be a candidate. She would benefit from a 23 mm Sapien 3 valve via the left TF approach. We did discuss the opportunity for her to have her GI blood loss potentially improved by removing her AS and she is hopeful for  that result also I have spent over 60 min in review of the records, visualizing the studies and in face to face with the patient and in coordinating her future care

## 2022-05-13 ENCOUNTER — Inpatient Hospital Stay (HOSPITAL_COMMUNITY): Payer: Medicare Other | Admitting: Critical Care Medicine

## 2022-05-13 ENCOUNTER — Inpatient Hospital Stay (HOSPITAL_COMMUNITY): Payer: Medicare Other

## 2022-05-13 ENCOUNTER — Encounter (HOSPITAL_COMMUNITY)
Admission: RE | Disposition: A | Discharge status: Home / Self Care | Payer: Self-pay | Source: Home / Self Care | Attending: Internal Medicine

## 2022-05-13 ENCOUNTER — Encounter (HOSPITAL_COMMUNITY): Payer: Self-pay | Admitting: Cardiovascular Disease

## 2022-05-13 ENCOUNTER — Inpatient Hospital Stay (HOSPITAL_COMMUNITY)
Admission: RE | Admit: 2022-05-13 | Discharge: 2022-05-29 | DRG: 266 | Disposition: A | Discharge status: Home / Self Care | Payer: Medicare Other | Attending: Internal Medicine | Admitting: Internal Medicine

## 2022-05-13 DIAGNOSIS — N179 Acute kidney failure, unspecified: Secondary | ICD-10-CM | POA: Diagnosis not present

## 2022-05-13 DIAGNOSIS — J90 Pleural effusion, not elsewhere classified: Secondary | ICD-10-CM | POA: Diagnosis not present

## 2022-05-13 DIAGNOSIS — J811 Chronic pulmonary edema: Secondary | ICD-10-CM | POA: Diagnosis not present

## 2022-05-13 DIAGNOSIS — Z9049 Acquired absence of other specified parts of digestive tract: Secondary | ICD-10-CM

## 2022-05-13 DIAGNOSIS — E785 Hyperlipidemia, unspecified: Secondary | ICD-10-CM | POA: Diagnosis present

## 2022-05-13 DIAGNOSIS — I442 Atrioventricular block, complete: Secondary | ICD-10-CM | POA: Diagnosis present

## 2022-05-13 DIAGNOSIS — K219 Gastro-esophageal reflux disease without esophagitis: Secondary | ICD-10-CM | POA: Diagnosis present

## 2022-05-13 DIAGNOSIS — Z952 Presence of prosthetic heart valve: Secondary | ICD-10-CM

## 2022-05-13 DIAGNOSIS — R1110 Vomiting, unspecified: Secondary | ICD-10-CM | POA: Diagnosis not present

## 2022-05-13 DIAGNOSIS — Z881 Allergy status to other antibiotic agents status: Secondary | ICD-10-CM | POA: Diagnosis not present

## 2022-05-13 DIAGNOSIS — Z79899 Other long term (current) drug therapy: Secondary | ICD-10-CM | POA: Diagnosis not present

## 2022-05-13 DIAGNOSIS — I110 Hypertensive heart disease with heart failure: Secondary | ICD-10-CM | POA: Diagnosis present

## 2022-05-13 DIAGNOSIS — D509 Iron deficiency anemia, unspecified: Secondary | ICD-10-CM | POA: Diagnosis present

## 2022-05-13 DIAGNOSIS — M1990 Unspecified osteoarthritis, unspecified site: Secondary | ICD-10-CM

## 2022-05-13 DIAGNOSIS — E871 Hypo-osmolality and hyponatremia: Secondary | ICD-10-CM | POA: Diagnosis present

## 2022-05-13 DIAGNOSIS — I350 Nonrheumatic aortic (valve) stenosis: Secondary | ICD-10-CM

## 2022-05-13 DIAGNOSIS — Z006 Encounter for examination for normal comparison and control in clinical research program: Secondary | ICD-10-CM

## 2022-05-13 DIAGNOSIS — Z818 Family history of other mental and behavioral disorders: Secondary | ICD-10-CM

## 2022-05-13 DIAGNOSIS — I272 Pulmonary hypertension, unspecified: Secondary | ICD-10-CM | POA: Diagnosis present

## 2022-05-13 DIAGNOSIS — R339 Retention of urine, unspecified: Secondary | ICD-10-CM | POA: Diagnosis not present

## 2022-05-13 DIAGNOSIS — J9811 Atelectasis: Secondary | ICD-10-CM | POA: Diagnosis not present

## 2022-05-13 DIAGNOSIS — I050 Rheumatic mitral stenosis: Secondary | ICD-10-CM | POA: Diagnosis not present

## 2022-05-13 DIAGNOSIS — J690 Pneumonitis due to inhalation of food and vomit: Secondary | ICD-10-CM | POA: Diagnosis not present

## 2022-05-13 DIAGNOSIS — M349 Systemic sclerosis, unspecified: Secondary | ICD-10-CM | POA: Diagnosis present

## 2022-05-13 DIAGNOSIS — F32A Depression, unspecified: Secondary | ICD-10-CM | POA: Diagnosis present

## 2022-05-13 DIAGNOSIS — J9601 Acute respiratory failure with hypoxia: Secondary | ICD-10-CM | POA: Diagnosis present

## 2022-05-13 DIAGNOSIS — Z885 Allergy status to narcotic agent status: Secondary | ICD-10-CM

## 2022-05-13 DIAGNOSIS — D696 Thrombocytopenia, unspecified: Secondary | ICD-10-CM | POA: Diagnosis not present

## 2022-05-13 DIAGNOSIS — E875 Hyperkalemia: Secondary | ICD-10-CM | POA: Diagnosis present

## 2022-05-13 DIAGNOSIS — Z888 Allergy status to other drugs, medicaments and biological substances status: Secondary | ICD-10-CM

## 2022-05-13 DIAGNOSIS — Z79890 Hormone replacement therapy (postmenopausal): Secondary | ICD-10-CM

## 2022-05-13 DIAGNOSIS — I4892 Unspecified atrial flutter: Secondary | ICD-10-CM | POA: Diagnosis not present

## 2022-05-13 DIAGNOSIS — Z90710 Acquired absence of both cervix and uterus: Secondary | ICD-10-CM

## 2022-05-13 DIAGNOSIS — I5032 Chronic diastolic (congestive) heart failure: Secondary | ICD-10-CM | POA: Insufficient documentation

## 2022-05-13 DIAGNOSIS — E039 Hypothyroidism, unspecified: Secondary | ICD-10-CM | POA: Diagnosis present

## 2022-05-13 DIAGNOSIS — R0603 Acute respiratory distress: Secondary | ICD-10-CM | POA: Diagnosis present

## 2022-05-13 DIAGNOSIS — J9600 Acute respiratory failure, unspecified whether with hypoxia or hypercapnia: Secondary | ICD-10-CM | POA: Insufficient documentation

## 2022-05-13 DIAGNOSIS — I5033 Acute on chronic diastolic (congestive) heart failure: Secondary | ICD-10-CM | POA: Diagnosis present

## 2022-05-13 DIAGNOSIS — I7300 Raynaud's syndrome without gangrene: Secondary | ICD-10-CM | POA: Diagnosis present

## 2022-05-13 DIAGNOSIS — E876 Hypokalemia: Secondary | ICD-10-CM | POA: Diagnosis not present

## 2022-05-13 DIAGNOSIS — R54 Age-related physical debility: Secondary | ICD-10-CM | POA: Diagnosis present

## 2022-05-13 DIAGNOSIS — I10 Essential (primary) hypertension: Secondary | ICD-10-CM | POA: Diagnosis present

## 2022-05-13 DIAGNOSIS — F418 Other specified anxiety disorders: Secondary | ICD-10-CM | POA: Diagnosis not present

## 2022-05-13 DIAGNOSIS — R0602 Shortness of breath: Secondary | ICD-10-CM | POA: Diagnosis not present

## 2022-05-13 DIAGNOSIS — D638 Anemia in other chronic diseases classified elsewhere: Secondary | ICD-10-CM | POA: Diagnosis not present

## 2022-05-13 DIAGNOSIS — Z20822 Contact with and (suspected) exposure to covid-19: Secondary | ICD-10-CM | POA: Diagnosis present

## 2022-05-13 DIAGNOSIS — K649 Unspecified hemorrhoids: Secondary | ICD-10-CM | POA: Diagnosis present

## 2022-05-13 DIAGNOSIS — R197 Diarrhea, unspecified: Secondary | ICD-10-CM | POA: Diagnosis not present

## 2022-05-13 DIAGNOSIS — I509 Heart failure, unspecified: Secondary | ICD-10-CM | POA: Diagnosis not present

## 2022-05-13 DIAGNOSIS — F419 Anxiety disorder, unspecified: Secondary | ICD-10-CM | POA: Insufficient documentation

## 2022-05-13 DIAGNOSIS — Z800 Family history of malignant neoplasm of digestive organs: Secondary | ICD-10-CM

## 2022-05-13 DIAGNOSIS — R0902 Hypoxemia: Secondary | ICD-10-CM | POA: Diagnosis not present

## 2022-05-13 HISTORY — PX: INTRAOPERATIVE TRANSTHORACIC ECHOCARDIOGRAM: SHX6523

## 2022-05-13 HISTORY — PX: TRANSCATHETER AORTIC VALVE REPLACEMENT, TRANSFEMORAL: SHX6400

## 2022-05-13 LAB — ECHOCARDIOGRAM LIMITED
AR max vel: 0.92 cm2
AV Area VTI: 0.93 cm2
AV Area mean vel: 0.89 cm2
AV Mean grad: 39.4 mmHg
AV Peak grad: 72.6 mmHg
Ao pk vel: 4.26 m/s
Area-P 1/2: 2.29 cm2
MV VTI: 1.59 cm2

## 2022-05-13 LAB — CBC
HCT: 22.4 % — ABNORMAL LOW (ref 36.0–46.0)
Hemoglobin: 7 g/dL — ABNORMAL LOW (ref 12.0–15.0)
MCH: 29.4 pg (ref 26.0–34.0)
MCHC: 31.3 g/dL (ref 30.0–36.0)
MCV: 94.1 fL (ref 80.0–100.0)
Platelets: 261 10*3/uL (ref 150–400)
RBC: 2.38 MIL/uL — ABNORMAL LOW (ref 3.87–5.11)
RDW: 17 % — ABNORMAL HIGH (ref 11.5–15.5)
WBC: 5.1 10*3/uL (ref 4.0–10.5)
nRBC: 0 % (ref 0.0–0.2)

## 2022-05-13 LAB — PREPARE RBC (CROSSMATCH)

## 2022-05-13 LAB — POCT I-STAT, CHEM 8
BUN: 9 mg/dL (ref 8–23)
Calcium, Ion: 1.17 mmol/L (ref 1.15–1.40)
Chloride: 109 mmol/L (ref 98–111)
Creatinine, Ser: 0.8 mg/dL (ref 0.44–1.00)
Glucose, Bld: 95 mg/dL (ref 70–99)
HCT: 22 % — ABNORMAL LOW (ref 36.0–46.0)
Hemoglobin: 7.5 g/dL — ABNORMAL LOW (ref 12.0–15.0)
Potassium: 3.6 mmol/L (ref 3.5–5.1)
Sodium: 144 mmol/L (ref 135–145)
TCO2: 21 mmol/L — ABNORMAL LOW (ref 22–32)

## 2022-05-13 LAB — HEMOGLOBIN AND HEMATOCRIT, BLOOD
HCT: 29.4 % — ABNORMAL LOW (ref 36.0–46.0)
Hemoglobin: 9.5 g/dL — ABNORMAL LOW (ref 12.0–15.0)

## 2022-05-13 SURGERY — IMPLANTATION, AORTIC VALVE, TRANSCATHETER, FEMORAL APPROACH
Anesthesia: Monitor Anesthesia Care

## 2022-05-13 MED ORDER — SODIUM CHLORIDE 0.9% IV SOLUTION: Freq: Once | INTRAVENOUS | Status: AC

## 2022-05-13 MED ORDER — ORAL CARE MOUTH RINSE: 15.0000 mL | Freq: Once | OROMUCOSAL | Status: DC

## 2022-05-13 MED ORDER — LEVOTHYROXINE SODIUM 75 MCG PO TABS
75.0000 ug | ORAL_TABLET | Freq: Every day | ORAL | Status: DC
  Administered 2022-05-15 – 2022-05-29 (×14): 75 ug via ORAL
  Filled 2022-05-13 (×14): qty 1

## 2022-05-13 MED ORDER — NIFEDIPINE ER OSMOTIC RELEASE 60 MG PO TB24
60.0000 mg | ORAL_TABLET | Freq: Every day | ORAL | Status: DC
  Administered 2022-05-13 – 2022-05-19 (×7): 60 mg via ORAL
  Filled 2022-05-13 (×8): qty 1

## 2022-05-13 MED ORDER — DEXMEDETOMIDINE HCL IN NACL 400 MCG/100ML IV SOLN
INTRAVENOUS | Status: DC | PRN
  Administered 2022-05-13: 22 ug via INTRAVENOUS

## 2022-05-13 MED ORDER — CHLORHEXIDINE GLUCONATE 4 % EX LIQD: 30.0000 mL | CUTANEOUS | Status: DC

## 2022-05-13 MED ORDER — CHLORHEXIDINE GLUCONATE 0.12 % MT SOLN
15.0000 mL | Freq: Once | OROMUCOSAL | Status: AC
  Administered 2022-05-13: 15 mL via OROMUCOSAL

## 2022-05-13 MED ORDER — CHLORHEXIDINE GLUCONATE 4 % EX LIQD: 60.0000 mL | Freq: Once | CUTANEOUS | Status: DC

## 2022-05-13 MED ORDER — HEPARIN SODIUM (PORCINE) 5000 UNIT/ML IJ SOLN
5000.0000 [IU] | Freq: Three times a day (TID) | INTRAMUSCULAR | Status: DC
  Administered 2022-05-13 – 2022-05-19 (×20): 5000 [IU] via SUBCUTANEOUS
  Filled 2022-05-13 (×20): qty 1

## 2022-05-13 MED ORDER — SODIUM CHLORIDE 0.9 % IV SOLN: INTRAVENOUS | Status: DC

## 2022-05-13 MED ORDER — PROPOFOL 500 MG/50ML IV EMUL
INTRAVENOUS | Status: DC | PRN
  Administered 2022-05-13: 50 ug/kg/min via INTRAVENOUS

## 2022-05-13 MED ORDER — HEPARIN (PORCINE) IN NACL 1000-0.9 UT/500ML-% IV SOLN
INTRAVENOUS | Status: DC | PRN
  Administered 2022-05-13 (×3): 500 mL

## 2022-05-13 MED ORDER — FLUOXETINE HCL 20 MG PO CAPS
40.0000 mg | ORAL_CAPSULE | Freq: Every day | ORAL | Status: DC
  Administered 2022-05-13 – 2022-05-29 (×16): 40 mg via ORAL
  Filled 2022-05-13 (×16): qty 2

## 2022-05-13 MED ORDER — ACETAMINOPHEN 325 MG PO TABS: 650.0000 mg | ORAL_TABLET | ORAL | Status: DC | PRN

## 2022-05-13 MED ORDER — SILDENAFIL CITRATE 20 MG PO TABS
20.0000 mg | ORAL_TABLET | Freq: Two times a day (BID) | ORAL | Status: DC
  Administered 2022-05-13 – 2022-05-29 (×32): 20 mg via ORAL
  Filled 2022-05-13 (×33): qty 1

## 2022-05-13 MED ORDER — CLONAZEPAM 0.5 MG PO TABS
0.5000 mg | ORAL_TABLET | Freq: Two times a day (BID) | ORAL | Status: DC | PRN
  Administered 2022-05-14 – 2022-05-28 (×16): 0.5 mg via ORAL
  Filled 2022-05-13 (×16): qty 1

## 2022-05-13 MED ORDER — EZETIMIBE 10 MG PO TABS
10.0000 mg | ORAL_TABLET | Freq: Every day | ORAL | Status: DC
  Administered 2022-05-13 – 2022-05-29 (×16): 10 mg via ORAL
  Filled 2022-05-13 (×16): qty 1

## 2022-05-13 MED ORDER — ACETAMINOPHEN 500 MG PO TABS
1000.0000 mg | ORAL_TABLET | Freq: Every day | ORAL | Status: DC
  Administered 2022-05-13 – 2022-05-28 (×16): 1000 mg via ORAL
  Filled 2022-05-13 (×16): qty 2

## 2022-05-13 MED ORDER — HEPARIN SODIUM (PORCINE) 1000 UNIT/ML IJ SOLN
INTRAMUSCULAR | Status: AC
  Filled 2022-05-13: qty 10

## 2022-05-13 MED ORDER — HEPARIN (PORCINE) IN NACL 1000-0.9 UT/500ML-% IV SOLN
INTRAVENOUS | Status: AC
  Filled 2022-05-13: qty 500

## 2022-05-13 MED ORDER — LIDOCAINE HCL 1 % IJ SOLN
INTRAMUSCULAR | Status: AC
  Filled 2022-05-13: qty 20

## 2022-05-13 MED ORDER — ONDANSETRON HCL 4 MG/2ML IJ SOLN
4.0000 mg | Freq: Four times a day (QID) | INTRAMUSCULAR | Status: DC | PRN
  Administered 2022-05-20: 4 mg via INTRAVENOUS

## 2022-05-13 MED ORDER — SODIUM CHLORIDE 0.9% FLUSH
3.0000 mL | Freq: Two times a day (BID) | INTRAVENOUS | Status: DC
  Administered 2022-05-13 – 2022-05-19 (×14): 3 mL via INTRAVENOUS

## 2022-05-13 MED ORDER — PANTOPRAZOLE SODIUM 40 MG PO TBEC
40.0000 mg | DELAYED_RELEASE_TABLET | Freq: Every day | ORAL | Status: DC
  Administered 2022-05-13 – 2022-05-29 (×16): 40 mg via ORAL
  Filled 2022-05-13 (×16): qty 1

## 2022-05-13 MED ORDER — SODIUM CHLORIDE 0.9 % IV SOLN: 250.0000 mL | INTRAVENOUS | Status: DC | PRN

## 2022-05-13 MED ORDER — SODIUM CHLORIDE 0.9% FLUSH: 3.0000 mL | INTRAVENOUS | Status: DC | PRN

## 2022-05-13 MED ORDER — LACTATED RINGERS IV SOLN: INTRAVENOUS | Status: DC

## 2022-05-13 MED ORDER — CHLORHEXIDINE GLUCONATE 0.12 % MT SOLN
15.0000 mL | Freq: Once | OROMUCOSAL | Status: DC
  Filled 2022-05-13: qty 15

## 2022-05-13 MED ORDER — ONDANSETRON HCL 4 MG/2ML IJ SOLN
INTRAMUSCULAR | Status: AC
  Filled 2022-05-13: qty 2

## 2022-05-13 MED ORDER — CYCLOSPORINE 0.05 % OP EMUL
1.0000 [drp] | Freq: Two times a day (BID) | OPHTHALMIC | Status: DC
  Administered 2022-05-13 – 2022-05-29 (×31): 1 [drp] via OPHTHALMIC
  Filled 2022-05-13 (×36): qty 30

## 2022-05-13 SURGICAL SUPPLY — 21 items
BAG SNAP BAND KOVER 36X36 (MISCELLANEOUS) ×6 IMPLANT
CABLE ADAPT PACING TEMP 12FT (ADAPTER) IMPLANT
CATH S G BIP PACING (CATHETERS) IMPLANT
CLOSURE PERCLOSE PROSTYLE (VASCULAR PRODUCTS) IMPLANT
GUIDEWIRE SAFE TJ AMPLATZ EXST (WIRE) IMPLANT
KIT HEART LEFT (KITS) ×3 IMPLANT
KIT MICROPUNCTURE NIT STIFF (SHEATH) IMPLANT
PACK CARDIAC CATHETERIZATION (CUSTOM PROCEDURE TRAY) ×3 IMPLANT
SHEATH BRITE TIP 7FR 35CM (SHEATH) IMPLANT
SHEATH PINNACLE 6F 10CM (SHEATH) IMPLANT
SHEATH PINNACLE 8F 10CM (SHEATH) IMPLANT
SHEATH PROBE COVER 6X72 (BAG) IMPLANT
SLEEVE REPOSITIONING LENGTH 30 (MISCELLANEOUS) IMPLANT
STOPCOCK MORSE 400PSI 3WAY (MISCELLANEOUS) ×6 IMPLANT
SYR MEDRAD MARK V 150ML (SYRINGE) IMPLANT
TRANSDUCER W/STOPCOCK (MISCELLANEOUS) ×6 IMPLANT
TUBING CIL FLEX 10 FLL-RA (TUBING) IMPLANT
WIRE EMERALD 3MM-J .035X150CM (WIRE) IMPLANT
WIRE EMERALD 3MM-J .035X260CM (WIRE) IMPLANT
WIRE EMERALD ST .035X260CM (WIRE) ×3 IMPLANT
WIRE SAFARI SM CURVE 275 (WIRE) IMPLANT

## 2022-05-13 NOTE — Progress Notes (Signed)
     [address]       [place] [address]             [phone]       Procedure for TAVR aborted when upon lying on operating room table, significant hypoxia was noted. Pt had bedside echo without any difference from outpt echo. Hemodynamics were stable. Was noted to also have decrease from preop Hgb from over 8 to just above 7. Pt has chronic anemia In discussions with anesthesia, Dr Angelena Form and Dr Ali Lowe, it was felt best to not proceed with TAVR at this time but to cancel case and admit for work up of hypoxia and to see if diuresis and transfusions necessary prior to proceeding.

## 2022-05-13 NOTE — Transfer of Care (Signed)
Immediate Anesthesia Transfer of Care Note  Patient: Haley Trevino  Procedure(s) Performed: Transcatheter Aortic Valve Replacement, Transfemoral (Left) INTRAOPERATIVE TRANSTHORACIC ECHOCARDIOGRAM  Patient Location: Cath Lab  Anesthesia Type:MAC  Level of Consciousness: awake, alert , and oriented  Airway & Oxygen Therapy: Patient Spontanous Breathing and Patient connected to nasal cannula oxygen  Post-op Assessment: Report given to RN and Post -op Vital signs reviewed and unstable, Anesthesiologist notified  Post vital signs: Reviewed and stable  Last Vitals:  Vitals Value Taken Time  BP 159/65   Temp    Pulse 81 05/13/22 1009  Resp 21 05/13/22 1009  SpO2 86 % 05/13/22 1009  Vitals shown include unvalidated device data.  Last Pain: There were no vitals filed for this visit.       Complications:  Encounter Notable Events  Notable Event Outcome Phase Comment  Case cancelled in OR  Intraprocedure Case cancelled due to hypoxia upon arrival to OR

## 2022-05-13 NOTE — Progress Notes (Signed)
During assessment pt is laying in bed with sob on 4L of O2. Paged RT.RT currently at bedside assessing pt. Also pt says she is anxious and needs her clonazepam for anxiety. Paged MD, awaiting order

## 2022-05-13 NOTE — Anesthesia Procedure Notes (Signed)
Procedure Name: MAC Date/Time: 05/13/2022 9:10 AM  Performed by: Anastasio Auerbach, CRNAPre-anesthesia Checklist: Emergency Drugs available, Suction available and Patient being monitored Oxygen Delivery Method: Nasal cannula Induction Type: IV induction

## 2022-05-13 NOTE — Progress Notes (Signed)
Spoke with Sharee Pimple, NP by phone, CXR result noted, awaiting for CBC to result, pt resting with eyes closed, safety maintained, no new orders at this time

## 2022-05-13 NOTE — Progress Notes (Signed)
LOCATION:  right RADIAL  A-Line removed  Gauze with tegaderm placed to insertion site  SITE UPON ARRIVAL: LEVEL 0  SITE AFTER BAND REMOVAL: LEVEL 0  CIRCULATION SENSATION AND MOVEMENT: + 2 bilateral radial pulses, + movement noted  COMMENTS: Manual pressure held for approximately 6 minutes

## 2022-05-13 NOTE — Interval H&P Note (Signed)
History and Physical Interval Note:  05/13/2022 6:32 AM  Haley Trevino  has presented today for surgery, with the diagnosis of Severe Aortic Stenosis.  The various methods of treatment have been discussed with the patient and family. After consideration of risks, benefits and other options for treatment, the patient has consented to  Procedure(s): Transcatheter Aortic Valve Replacement, Transfemoral (Left) INTRAOPERATIVE TRANSTHORACIC ECHOCARDIOGRAM (N/A) as a surgical intervention.  The patient's history has been reviewed, patient examined, no change in status, stable for surgery.  I have reviewed the patient's chart and labs.  Questions were answered to the patient's satisfaction.     Coralie Common

## 2022-05-13 NOTE — Anesthesia Preprocedure Evaluation (Addendum)
Anesthesia Evaluation  Patient identified by MRN, date of birth, ID band Patient awake    Reviewed: Allergy & Precautions, NPO status   Airway Mallampati: II       Dental   Pulmonary neg pulmonary ROS   breath sounds clear to auscultation       Cardiovascular hypertension, + Valvular Problems/Murmurs  Rhythm:Regular Rate:Normal + Systolic murmurs History note Dr. Nyoka Cowden   Neuro/Psych negative neurological ROS     GI/Hepatic Neg liver ROS,GERD  ,,  Endo/Other  Hypothyroidism    Renal/GU negative Renal ROS     Musculoskeletal  (+) Arthritis ,    Abdominal   Peds  Hematology   Anesthesia Other Findings   Reproductive/Obstetrics                             Anesthesia Physical Anesthesia Plan  ASA: 3  Anesthesia Plan: MAC   Post-op Pain Management:    Induction: Intravenous  PONV Risk Score and Plan: 2 and Ondansetron and Dexamethasone  Airway Management Planned: Simple Face Mask  Additional Equipment:   Intra-op Plan:   Post-operative Plan:   Informed Consent: I have reviewed the patients History and Physical, chart, labs and discussed the procedure including the risks, benefits and alternatives for the proposed anesthesia with the patient or authorized representative who has indicated his/her understanding and acceptance.     Dental advisory given  Plan Discussed with: CRNA and Anesthesiologist  Anesthesia Plan Comments:        Anesthesia Quick Evaluation

## 2022-05-13 NOTE — Anesthesia Postprocedure Evaluation (Signed)
Anesthesia Post Note  Patient: ENIOLA CERULLO  Procedure(s) Performed: Transcatheter Aortic Valve Replacement, Transfemoral (Left) INTRAOPERATIVE TRANSTHORACIC ECHOCARDIOGRAM     Patient location during evaluation: Cath Lab Anesthesia Type: MAC Level of consciousness: awake Pain management: pain level controlled Vital Signs Assessment: post-procedure vital signs reviewed and stable Respiratory status: spontaneous breathing Cardiovascular status: stable Postop Assessment: no apparent nausea or vomiting Anesthetic complications: no   Encounter Notable Events  Notable Event Outcome Phase Comment  Case cancelled in OR  Intraprocedure Case cancelled due to hypoxia upon arrival to OR    Last Vitals:  Vitals:   05/13/22 1540 05/13/22 1618  BP: (!) 130/55 (!) 115/59  Pulse: 80 79  Resp: 20 (!) 25  Temp: 37.1 C 36.8 C  SpO2: 94% 91%    Last Pain:  Vitals:   05/13/22 1618  TempSrc: Oral  PainSc:                  Eyan Hagood

## 2022-05-13 NOTE — Anesthesia Procedure Notes (Signed)
Arterial Line Insertion Start/End11/01/2022 8:00 AM Performed by: Anastasio Auerbach, CRNA, CRNA  Patient location: Pre-op. Preanesthetic checklist: patient identified, IV checked, site marked, risks and benefits discussed, surgical consent, monitors and equipment checked, pre-op evaluation, timeout performed and anesthesia consent Lidocaine 1% used for infiltration Right, radial was placed Catheter size: 20 G Hand hygiene performed , maximum sterile barriers used  and Seldinger technique used Allen's test indicative of satisfactory collateral circulation Attempts: 1 Procedure performed without using ultrasound guided technique. Following insertion, Biopatch and dressing applied. Post procedure assessment: normal  Patient tolerated the procedure well with no immediate complications.

## 2022-05-14 DIAGNOSIS — J9601 Acute respiratory failure with hypoxia: Secondary | ICD-10-CM | POA: Diagnosis not present

## 2022-05-14 DIAGNOSIS — E875 Hyperkalemia: Secondary | ICD-10-CM | POA: Diagnosis not present

## 2022-05-14 DIAGNOSIS — J9600 Acute respiratory failure, unspecified whether with hypoxia or hypercapnia: Secondary | ICD-10-CM | POA: Insufficient documentation

## 2022-05-14 DIAGNOSIS — I5033 Acute on chronic diastolic (congestive) heart failure: Secondary | ICD-10-CM | POA: Diagnosis not present

## 2022-05-14 DIAGNOSIS — D509 Iron deficiency anemia, unspecified: Secondary | ICD-10-CM | POA: Diagnosis not present

## 2022-05-14 DIAGNOSIS — F419 Anxiety disorder, unspecified: Secondary | ICD-10-CM | POA: Insufficient documentation

## 2022-05-14 LAB — BASIC METABOLIC PANEL
Anion gap: 8 (ref 5–15)
Anion gap: 3 — ABNORMAL LOW (ref 5–15)
BUN: 13 mg/dL (ref 8–23)
BUN: 10 mg/dL (ref 8–23)
CO2: 20 mmol/L — ABNORMAL LOW (ref 22–32)
CO2: 22 mmol/L (ref 22–32)
Calcium: 8.4 mg/dL — ABNORMAL LOW (ref 8.9–10.3)
Calcium: 8 mg/dL — ABNORMAL LOW (ref 8.9–10.3)
Chloride: 112 mmol/L — ABNORMAL HIGH (ref 98–111)
Chloride: 115 mmol/L — ABNORMAL HIGH (ref 98–111)
Creatinine, Ser: 0.88 mg/dL (ref 0.44–1.00)
Creatinine, Ser: 0.83 mg/dL (ref 0.44–1.00)
GFR, Estimated: >60 mL/min (ref >60)
GFR, Estimated: >60 mL/min (ref >60)
Glucose, Bld: 105 mg/dL — ABNORMAL HIGH (ref 70–99)
Glucose, Bld: 95 mg/dL (ref 70–99)
Potassium: 5.7 mmol/L — ABNORMAL HIGH (ref 3.5–5.1)
Potassium: 4.1 mmol/L (ref 3.5–5.1)
Sodium: 140 mmol/L (ref 135–145)
Sodium: 140 mmol/L (ref 135–145)

## 2022-05-14 LAB — TYPE AND SCREEN
ABO/RH(D): O POS
Antibody Screen: NEGATIVE
Unit division: 0

## 2022-05-14 LAB — BPAM RBC
Blood Product Expiration Date: 202312022359
ISSUE DATE / TIME: 202311071554
Unit Type and Rh: 5100

## 2022-05-14 MED ORDER — FUROSEMIDE 10 MG/ML IJ SOLN
20.0000 mg | Freq: Once | INTRAMUSCULAR | Status: AC
  Administered 2022-05-14: 20 mg via INTRAVENOUS
  Filled 2022-05-14: qty 2

## 2022-05-14 MED ORDER — SODIUM ZIRCONIUM CYCLOSILICATE 5 G PO PACK
5.0000 g | PACK | Freq: Once | ORAL | Status: DC
  Filled 2022-05-14: qty 1

## 2022-05-14 MED FILL — Lidocaine HCl Local Inj 1%: INTRAMUSCULAR | Qty: 40 | Status: AC

## 2022-05-14 MED FILL — Ondansetron HCl Inj 4 MG/2ML (2 MG/ML): INTRAMUSCULAR | Qty: 2 | Status: AC

## 2022-05-14 MED FILL — Heparin Sodium (Porcine) Inj 1000 Unit/ML: INTRAMUSCULAR | Qty: 10 | Status: AC

## 2022-05-14 NOTE — Progress Notes (Addendum)
[place] VALVE TEAM  Patient Name: Haley Trevino Date of Encounter: 05/14/2022  Primary Cardiologist: Dr. Percival Spanish, [facility] Problem List     Principal Problem:   Aortic stenosis Active Problems:   [name]   Hypertension   Hyperlipidemia with target LDL less than 100   Chronic diastolic heart failure (HCC)   Acute respiratory distress   Anxiety   [name]   Subjective   Feeling well this AM. Had an episode of anxiety with hypoxia overnight and is requiring FLNC to keep O2 saturations >90%. Feels this is anxiety driven. No chest pain or SOB.   Inpatient Medications    Scheduled Meds:  acetaminophen  1,000 mg Oral QHS   cycloSPORINE  1 drop Both Eyes BID   ezetimibe  10 mg Oral Daily   FLUoxetine  40 mg Oral Daily   heparin  5,000 Units Subcutaneous Q8H   [START ON 05/15/2022] levothyroxine  75 mcg Oral Q0600   NIFEdipine  60 mg Oral Daily   pantoprazole  40 mg Oral Daily   sildenafil  20 mg Oral BID   sodium chloride flush  3 mL Intravenous Q12H   sodium zirconium cyclosilicate  5 g Oral Once   Continuous Infusions:  sodium chloride     PRN Meds: sodium chloride, acetaminophen, clonazePAM, ondansetron (ZOFRAN) IV, sodium chloride flush   Vital Signs    Vitals:   05/13/22 2230 05/13/22 2313 05/14/22 0307 05/14/22 0731  BP:  (!) 127/58 (!) 110/55 (!) 127/56  Pulse: 86 85 78 82  Resp: 20 18 (!) 21 13  Temp:  99.3 F (37.4 C) 98.5 F (36.9 C) 97.7 F (36.5 C)  TempSrc:  Oral Oral Oral  SpO2: 90% 91% 91% 92%  Weight:   45.5 kg   Height:        Intake/Output Summary (Last 24 hours) at 05/14/2022 0940 Last data filed at 05/13/2022 1917 Gross per 24 hour  Intake 619.17 ml  Output --  Net 619.17 ml   Filed Weights   05/13/22 0628 05/14/22 0307  Weight: 44.9 kg 45.5 kg   Physical Exam    General: Well developed, well nourished, NAD Lungs:Clear to ausculation  bilaterally. No wheezes, rales, or rhonchi. Breathing is unlabored. Cardiovascular: RRR with S1 S2. Harsh, loud systolic murmur Abdomen: Soft, non-tender, non-distended. No obvious abdominal masses. Extremities: No edema. Neuro: Alert and oriented. No focal deficits. No facial asymmetry. MAE spontaneously. Psych: Responds to questions appropriately with normal affect.    Labs    CBC Recent Labs    05/12/22 0830 05/13/22 0757 05/13/22 1100 05/13/22 2042  WBC 6.9  --  5.1  --   HGB 8.6*   < > 7.0* 9.5*  HCT 28.3*   < > 22.4* 29.4*  MCV 97.6  --  94.1  --   PLT 297  --  261  --    < > = values in this interval not displayed.   Basic Metabolic Panel Recent Labs    05/12/22 0830 05/13/22 0757 05/14/22 0022  NA 139 144 140  K 3.2* 3.6 5.7*  CL 109 109 112*  CO2 23  --  20*  GLUCOSE 100* 95 105*  BUN _0 CREATININE 0.90 0.80 0.88  CALCIUM 8.7*  --  8.4*   Liver Function Tests Recent Labs    05/12/22 0830  AST 20  ALT 12  ALKPHOS 42  BILITOT 0.4  PROT 6.7  ALBUMIN 3.5   No results for input(s): "LIPASE", "AMYLASE" in the last 72 hours. Cardiac Enzymes No results for input(s): "CKTOTAL", "CKMB", "CKMBINDEX", "TROPONINI" in the last 72 hours. BNP Invalid input(s): "POCBNP" D-Dimer No results for input(s): "DDIMER" in the last 72 hours. Hemoglobin A1C No results for input(s): "HGBA1C" in the last 72 hours. Fasting Lipid Panel No results for input(s): "CHOL", "HDL", "LDLCALC", "TRIG", "CHOLHDL", "LDLDIRECT" in the last 72 hours. Thyroid Function Tests No results for input(s): "TSH", "T4TOTAL", "T3FREE", "THYROIDAB" in the last 72 hours.  Invalid input(s): "FREET3"  Telemetry    05/14/22 NSR with rates in the 60-80's  - Personally Reviewed  ECG    No new tracing as of 05/14/22 - Personally Reviewed  Radiology    DG CHEST PORT 1 VIEW  Result Date: 05/13/2022 CLINICAL DATA:  Hypoxia during TAVR. EXAM: PORTABLE CHEST 1 VIEW COMPARISON:  05/12/2022  FINDINGS: The cardiac silhouette, mediastinal and hilar contours within normal limits given the AP projection and portable technique. Mild old central vascular congestion and slight increased interstitial markings with Kerley B-lines noted suggesting mild interstitial edema. No pleural effusions. No pneumothorax. IMPRESSION: Mild interstitial edema. Electronically Signed   By: Marijo Sanes M.D.   On: 05/13/2022 11:17   ECHOCARDIOGRAM LIMITED  Result Date: 05/13/2022    ECHOCARDIOGRAM LIMITED REPORT   Patient Name:   Haley Trevino Date of Exam: 05/13/2022 Medical Rec #:  [id number]          Height:       62.0 in Accession #:    [id number]         Weight:       99.0 lb Date of Birth:  January 23, 1941          BSA:          1.418 m Patient Age:    82 years           BP:           209/70 mmHg Patient Gender: F                  HR:           88 bpm. Exam Location:  Inpatient Procedure: Limited Echo, Color Doppler and Cardiac Doppler Indications:     Aortic Stenosis i35.0  History:         Patient has prior history of Echocardiogram examinations, most                  recent 03/20/2022. Risk Factors:Hypertension and Dyslipidemia.  Sonographer:     Raquel Sarna Senior RDCS Referring Phys:  [place]: Sanda Klein MD IMPRESSIONS  1. Left ventricular ejection fraction, by estimation, is 70 to 75%. The left ventricle has hyperdynamic function. The left ventricle has no regional wall motion abnormalities. There is mild concentric left ventricular hypertrophy.  2. Right ventricular systolic function is normal. The right ventricular size is normal. There is moderately elevated pulmonary artery systolic pressure. The estimated right ventricular systolic pressure is 26.3 mmHg.  3. Left atrial size was severely dilated.  4. The mitral valve is degenerative. Trivial mitral valve regurgitation. Mild to moderate mitral stenosis. The mean mitral valve gradient is 9.9 mmHg with average heart rate of 85  bpm. Severe mitral annular calcification.  5. There is severe calcifcation of the aortic valve. There is severe thickening of the aortic valve. Severe aortic valve stenosis.  6. The inferior vena  cava is normal in size with greater than 50% respiratory variability, suggesting right atrial pressure of 3 mmHg. Comparison(s): Prior images reviewed side by side. PA pressure is higher. Otherwise little change. FINDINGS  Left Ventricle: Left ventricular ejection fraction, by estimation, is 70 to 75%. The left ventricle has hyperdynamic function. The left ventricle has no regional wall motion abnormalities. There is mild concentric left ventricular hypertrophy. Right Ventricle: The right ventricular size is normal. Right vetricular wall thickness was not well visualized. Right ventricular systolic function is normal. There is moderately elevated pulmonary artery systolic pressure. The tricuspid regurgitant velocity is 3.54 m/s, and with an assumed right atrial pressure of 3 mmHg, the estimated right ventricular systolic pressure is 46.5 mmHg. Left Atrium: Left atrial size was severely dilated. Right Atrium: Right atrial size was normal in size. Mitral Valve: The mitral valve is degenerative in appearance. Severe mitral annular calcification. Trivial mitral valve regurgitation. Mild to moderate mitral valve stenosis. MV peak gradient, 20.8 mmHg. The mean mitral valve gradient is 9.9 mmHg with average heart rate of 85 bpm. Tricuspid Valve: The tricuspid valve is normal in structure. Tricuspid valve regurgitation is mild. Aortic Valve: There is severe calcifcation of the aortic valve. There is severe thickening of the aortic valve. Severe aortic stenosis is present. Aortic valve mean gradient measures 39.4 mmHg. Aortic valve peak gradient measures 72.6 mmHg. Aortic valve area, by VTI measures 0.93 cm. Pulmonic Valve: The pulmonic valve was grossly normal. Pulmonic valve regurgitation is not visualized. Venous: The inferior  vena cava is normal in size with greater than 50% respiratory variability, suggesting right atrial pressure of 3 mmHg. IAS/Shunts: The interatrial septum was not well visualized. Additional Comments: Spectral Doppler performed. Color Doppler performed.  LEFT VENTRICLE PLAX 2D LVOT diam:     1.90 cm LV SV:         82 LV SV Index:   58 LVOT Area:     2.84 cm  AORTIC VALVE AV Area (Vmax):    0.92 cm AV Area (Vmean):   0.89 cm AV Area (VTI):     0.93 cm AV Vmax:           426.02 cm/s AV Vmean:          290.130 cm/s AV VTI:            0.879 m AV Peak Grad:      72.6 mmHg AV Mean Grad:      39.4 mmHg LVOT Vmax:         138.98 cm/s LVOT Vmean:        91.347 cm/s LVOT VTI:          0.289 m LVOT/AV VTI ratio: 0.33 MITRAL VALVE              TRICUSPID VALVE MV Area (PHT): 2.29 cm   TR Peak grad:   50.1 mmHg MV Area VTI:   1.59 cm   TR Vmax:        354.00 cm/s MV Peak grad:  20.8 mmHg MV Mean grad:  9.9 mmHg   SHUNTS MV Vmax:       2.28 m/s   Systemic VTI:  0.29 m MV VTI:        0.52 m     Systemic Diam: 1.90 cm MV Decel Time: 331 msec Mihai Croitoru MD Electronically signed by Sanda Klein MD Signature Date/Time: 05/13/2022/10:30:51 AM    Final    Structural Heart Procedure  Result Date: 05/13/2022 See surgical note  for result.   Cardiac Studies   Aborted TAVR 05/13/22:  Procedure for TAVR aborted when upon lying on operating room table, significant hypoxia was noted. Pt had bedside echo without any difference from outpt echo. Hemodynamics were stable. Was noted to also have decrease from preop Hgb from over 8 to just above 7. Pt has chronic anemia In discussions with anesthesia, Dr Angelena Form and Dr Ali Lowe, it was felt best to not proceed with TAVR at this time but to cancel case and admit for work up of hypoxia and to see if diuresis and transfusions necessary prior to proceeding.   TTE (03/2022)  IMPRESSIONS    1. The aortic valve is tricuspid. There is severe calcifcation of the  aortic valve. There  is severe thickening of the aortic valve. Aortic valve  regurgitation is trivial. Likely severe aortic valve stenosis with aortic  valve mean gradient of 48.5 mmHg  and Vmax 4.38 m/s. The patient is also hyperdynamic which contributes to  the elevated gradients. The aortic valve area and DI calculate in the  moderate range with AVA 1.1cm2, DI 0.35 (LVOT VTI 35). Visually, however,  the valve appears severely stenosed.   2. Left ventricular ejection fraction, by estimation, is >75%. The left  ventricle has hyperdynamic function. The left ventricle has no regional  wall motion abnormalities. There is moderate concentric left ventricular  hypertrophy. Left ventricular  diastolic parameters are consistent with Grade II diastolic dysfunction  (pseudonormalization).   3. Right ventricular systolic function is normal. The right ventricular  size is normal. There is mildly elevated pulmonary artery systolic  pressure.   4. Left atrial size was severely dilated.   5. Right atrial size was mildly dilated.   6. The mitral valve is degenerative. Trivial mitral valve regurgitation.  Mild to moderate mitral stenosis. Severe mitral annular calcification. MVA  by PHT is 1.9cm2 and by continuity is 1.75cm2. Mean gradient is 40mHg at  HR 70bpm.   7. The inferior vena cava is normal in size with greater than 50%  respiratory variability, suggesting right atrial pressure of 3 mmHg.   Comparison(s): Compared to prior TTE report in 2019, the AS has progressed  from mild to severe and there is now mild-to-moderate calcific MS.   Patient Profile   Haley Trevino an 83yoF with a hx of scleroderma and Raynaud's on chronic Revatio therapy, HLD, iron deficiency anemia managed with intermittent iron infusion, chronic diastolic CHF with elevated LVEDP on cath, moderate mitral stenosis due to MAC, and severe AS [facility] however was found to be hypoxic on anesthesia  induction and case was aborted.  She was admitted for acute respiratory failure and acute anemia.   Assessment & Plan   Acute respiratory failure with hypoxia: [facility] found to be hypoxic on anesthesia induction with O2 saturations in the 70's. Case aborted when upon lying on operating room table. Bedside echocardiogram with no significant change from prior imaging. Hemodynamics were stable. PAT lab work showed a Hb of 8.6 with iSTAT at 7.5>>then 7.0 confirmed on venous stick day of procedure. In discussions with anesthesia, Dr MAngelena Formand Dr TAli Lowe it was felt best to not proceed with TAVR but to cancel case and admit for work up of hypoxia. Repeat labs showed Hb at 7.0 therefore transfused with 1 unit PRBC with improvement. Patient feels hypoxia is anxiety driven. Additional episode overnight requiring FLNC>>now with saturations greater  than 95%. Plan to wean [facility] today. Ambulate patient. May require supplemental home O2 at discharge. Discussed with patient and family.   Chronic iron deficiency anemia: Follows closely with GI and hematology. She receives Q3 week iron infusions, last infusion was Friday prior to scheduled TAVR. Reports intermittent blood tinged stool however felt to be secondary to hemorrhoids. Transfused 1 unit PRBC yesterday with Hb today at 9.5. Plan for close follow up with hematology for optimization prior to rescheduling TAVR.   Anxiety: Chronic long term anxiety typically controlled with klonopin, Prozac. Feels she was extremely anxious coming in for her procedure and likely contributing to her hypoxia. Improved now however on HFNC>>plan to wean today and ambulate. Continue current antianxiety regimen.   Hyperkalemia: K+ at 5.7 this AM. Will give one dose of lokelma today.   Severe aortic stenosis: Presented for TAVR 05/13/22 with case aborted due to hypoxia. Will need to discuss with the team about timing of procedure. Likely will  need to be seen by hem and possibly GI for optimization before re-attempt.   Chronic diastolic CHF: On home Lasix 68m. Does not appear volume overloaded on exam. CXR with no acute abnormalities. Will restart low dose Lasix today  Moderate mitral stenosis with MAC: Continue to follow with surveillance imaging.   Signed,Kathyrn Drown NP  05/14/2022, 9:40 AM  Pager [phone]  Patient seen, examined. Available data reviewed. Agree with findings, assessment, and plan as outlined by JKathyrn Drown NP.  The patient is independently interviewed and examined.  Her husband is at the bedside.  She is an elderly woman in no distress.  JVP is normal, lungs with fine crackles at the bases, heart is regular rate and rhythm with a 3/6 harsh late peaking crescendo decrescendo murmur at the right upper sternal border with absent A2, abdomen is soft and nontender, lower extremities have trace pretibial edema, skin with no rash.  All available data reviewed.  The patient presented for TAVR yesterday, but her case was canceled because of hypoxemia.  This was complicated by the presence of acute on chronic anemia.  She has now received 1 unit of packed red blood cells.  Her O2 requirement remains significant.  I reviewed her chest x-ray and it shows mild interstitial edema.  I think we should diurese her with IV Lasix, repeat a chest x-ray in the morning, and assess her for home O2 needs.  I also think she should have TAVR relatively soon as I suspect severe aortic stenosis is significantly contributing to her presentation with acute on chronic diastolic heart failure.  After IV diuresis today, will repeat labs tomorrow morning.  MSherren Mocha M.D. 05/14/2022 9:58 AM

## 2022-05-14 NOTE — TOC Progression Note (Signed)
Transition of Care [facility]) - Progression Note    Patient Details  Name: Haley Trevino MRN: [id number] Date of Birth: 1941-03-12  [facility] CM/SW Contact  Zenon Mayo, RN Phone Number: 05/14/2022, 1:22 PM  Clinical Narrative:    from home, TAVR was canceled yesterday due to hypoxia, received iv lasix.  TOC following.        Expected Discharge Plan and Services                                                 Social Determinants of Health (SDOH) Interventions    Readmission Risk Interventions     No data to display

## 2022-05-14 NOTE — Plan of Care (Signed)
  Problem: Clinical Measurements: Goal: Cardiovascular complication will be avoided Outcome: Progressing   Problem: Elimination: Goal: Will not experience complications related to urinary retention Outcome: Progressing   Problem: Pain Managment: Goal: General experience of comfort will improve Outcome: Progressing   Problem: Clinical Measurements: Goal: Respiratory complications will improve Outcome: Not Progressing   Problem: Activity: Goal: Risk for activity intolerance will decrease Outcome: Not Progressing   Problem: Coping: Goal: Level of anxiety will decrease Outcome: Not Progressing

## 2022-05-14 NOTE — Plan of Care (Signed)
  Problem: Clinical Measurements: Goal: Respiratory complications will improve Outcome: Progressing Goal: Cardiovascular complication will be avoided Outcome: Progressing   Problem: Activity: Goal: Risk for activity intolerance will decrease Outcome: Progressing   Problem: Nutrition: Goal: Adequate nutrition will be maintained Outcome: Progressing   Problem: Coping: Goal: Level of anxiety will decrease Outcome: Progressing   Problem: Elimination: Goal: Will not experience complications related to urinary retention Outcome: Progressing

## 2022-05-15 ENCOUNTER — Inpatient Hospital Stay (HOSPITAL_COMMUNITY): Payer: Medicare Other

## 2022-05-15 DIAGNOSIS — D509 Iron deficiency anemia, unspecified: Secondary | ICD-10-CM | POA: Diagnosis not present

## 2022-05-15 DIAGNOSIS — I5033 Acute on chronic diastolic (congestive) heart failure: Secondary | ICD-10-CM | POA: Diagnosis not present

## 2022-05-15 DIAGNOSIS — E875 Hyperkalemia: Secondary | ICD-10-CM | POA: Diagnosis not present

## 2022-05-15 DIAGNOSIS — J9601 Acute respiratory failure with hypoxia: Secondary | ICD-10-CM | POA: Diagnosis not present

## 2022-05-15 LAB — POCT I-STAT 7, (LYTES, BLD GAS, ICA,H+H)
Acid-base deficit: 4 mmol/L — ABNORMAL HIGH (ref 0.0–2.0)
Bicarbonate: 18.9 mmol/L — ABNORMAL LOW (ref 20.0–28.0)
Calcium, Ion: 1.19 mmol/L (ref 1.15–1.40)
HCT: 21 % — ABNORMAL LOW (ref 36.0–46.0)
Hemoglobin: 7.1 g/dL — ABNORMAL LOW (ref 12.0–15.0)
O2 Saturation: 74 %
Potassium: 3.3 mmol/L — ABNORMAL LOW (ref 3.5–5.1)
Sodium: 143 mmol/L (ref 135–145)
TCO2: 20 mmol/L — ABNORMAL LOW (ref 22–32)
pCO2 arterial: 26.2 mmHg — ABNORMAL LOW (ref 32–48)
pH, Arterial: 7.466 — ABNORMAL HIGH (ref 7.35–7.45)
pO2, Arterial: 36 mmHg — CL (ref 83–108)

## 2022-05-15 LAB — BASIC METABOLIC PANEL
Anion gap: 7 (ref 5–15)
BUN: 9 mg/dL (ref 8–23)
CO2: 22 mmol/L (ref 22–32)
Calcium: 8.1 mg/dL — ABNORMAL LOW (ref 8.9–10.3)
Chloride: 112 mmol/L — ABNORMAL HIGH (ref 98–111)
Creatinine, Ser: 0.82 mg/dL (ref 0.44–1.00)
GFR, Estimated: >60 mL/min (ref >60)
Glucose, Bld: 120 mg/dL — ABNORMAL HIGH (ref 70–99)
Potassium: 3.6 mmol/L (ref 3.5–5.1)
Sodium: 141 mmol/L (ref 135–145)

## 2022-05-15 LAB — CBC
HCT: 29.6 % — ABNORMAL LOW (ref 36.0–46.0)
Hemoglobin: 9.4 g/dL — ABNORMAL LOW (ref 12.0–15.0)
MCH: 29.7 pg (ref 26.0–34.0)
MCHC: 31.8 g/dL (ref 30.0–36.0)
MCV: 93.4 fL (ref 80.0–100.0)
Platelets: 251 10*3/uL (ref 150–400)
RBC: 3.17 MIL/uL — ABNORMAL LOW (ref 3.87–5.11)
RDW: 18.6 % — ABNORMAL HIGH (ref 11.5–15.5)
WBC: 6.8 10*3/uL (ref 4.0–10.5)
nRBC: 0.3 % — ABNORMAL HIGH (ref 0.0–0.2)

## 2022-05-15 MED ORDER — FUROSEMIDE 10 MG/ML IJ SOLN
40.0000 mg | Freq: Two times a day (BID) | INTRAMUSCULAR | Status: DC
  Administered 2022-05-15 – 2022-05-17 (×6): 40 mg via INTRAVENOUS
  Filled 2022-05-15 (×6): qty 4

## 2022-05-15 MED ORDER — FUROSEMIDE 10 MG/ML IJ SOLN: 40.0000 mg | Freq: Every day | INTRAMUSCULAR | Status: DC

## 2022-05-15 MED ORDER — POTASSIUM CHLORIDE CRYS ER 20 MEQ PO TBCR
40.0000 meq | EXTENDED_RELEASE_TABLET | Freq: Two times a day (BID) | ORAL | Status: AC
  Administered 2022-05-15 (×2): 40 meq via ORAL
  Filled 2022-05-15 (×2): qty 2

## 2022-05-15 MED ORDER — POTASSIUM CHLORIDE CRYS ER 20 MEQ PO TBCR: 40.0000 meq | EXTENDED_RELEASE_TABLET | Freq: Once | ORAL | Status: DC

## 2022-05-15 NOTE — Progress Notes (Signed)
Nurse requested Mobility Specialist to perform oxygen saturation test with pt which includes removing pt from oxygen both at rest and while ambulating.  Below are the results from that testing.     Patient Saturations on Room Air at Rest = spO2 90%  Patient Saturations on Room Air while Ambulating = sp02 85% .  Rested and performed pursed lip breathing for 1 minute with sp02 at 85%.  Patient Saturations on 4 Liters of oxygen while Ambulating = sp02 89%  At end of testing pt left in room on 5  Liters of oxygen.  Reported results to nurse.

## 2022-05-15 NOTE — Progress Notes (Signed)
Mobility Specialist Progress Note    05/15/22 0855  Mobility  Activity Ambulated with assistance in hallway  Level of Assistance Contact guard assist, steadying assist  Assistive Device Front wheel walker  Distance Ambulated (ft) 360 ft  Activity Response Tolerated well  Mobility Referral Yes  $Mobility charge 1 Mobility   Pre-Mobility: 81 HR, 93% SpO2 on 5LO2 During Mobility: 85% SpO2 on RA Post-Mobility: 79 HR, 94% SpO2 on 5LO2  Pt received in bed and agreeable. No complaints. Required 4LO2 to maintain SpO2 88-90%. Returned to bed with call bell in reach.   Hildred Alamin Mobility Specialist  Please Psychologist, sport and exercise or Rehab Office at [phone]

## 2022-05-15 NOTE — Progress Notes (Addendum)
[place] VALVE TEAM  Patient Name: Haley Trevino Date of Encounter: 05/15/2022  Primary Cardiologist:  Dr. Percival Spanish, [facility] Problem List     Principal Problem:   Aortic stenosis Active Problems:   [name]   Hypertension   Hyperlipidemia with target LDL less than 100   Acute on chronic diastolic heart failure (HCC)   Acute respiratory distress   Anxiety   [name]   Hyperkalemia     Subjective   Feeling well, getting up to go to the bathroom. No problems overnight. Asking to go home. Spo2 mid 90s on 6L [place] weaned down from HFNC.   Inpatient Medications    Scheduled Meds:  acetaminophen  1,000 mg Oral QHS   cycloSPORINE  1 drop Both Eyes BID   ezetimibe  10 mg Oral Daily   FLUoxetine  40 mg Oral Daily   heparin  5,000 Units Subcutaneous Q8H   levothyroxine  75 mcg Oral Q0600   NIFEdipine  60 mg Oral Daily   pantoprazole  40 mg Oral Daily   sildenafil  20 mg Oral BID   sodium chloride flush  3 mL Intravenous Q12H   Continuous Infusions:  sodium chloride     PRN Meds: sodium chloride, acetaminophen, clonazePAM, ondansetron (ZOFRAN) IV, sodium chloride flush   Vital Signs    Vitals:   05/14/22 2328 05/15/22 0332 05/15/22 0500 05/15/22 0733  BP: (!) 116/56 (!) 124/59  132/63  Pulse: 76 68  73  Resp: _0 Temp: 98.5 F (36.9 C) 98.4 F (36.9 C)  98.1 F (36.7 C)  TempSrc: Oral Oral    SpO2: 95% (!) 89%  95%  Weight:   45.3 kg   Height:        Intake/Output Summary (Last 24 hours) at 05/15/2022 0847 Last data filed at 05/14/2022 1032 Gross per 24 hour  Intake 3 ml  Output --  Net 3 ml   Filed Weights   05/13/22 0628 05/14/22 0307 05/15/22 0500  Weight: 44.9 kg 45.5 kg 45.3 kg    Physical Exam    GEN: Well nourished, well developed, in no acute distress.  Cardiac: RRR with S1 S2, harsh, loud systolic murmur.   Respiratory:  Respirations regular and  unlabored, clear to auscultation bilaterally. GI: Soft, nontender, nondistended Extremities: no edema  Skin: warm and dry, no rash. Psych: AAOx3.  Normal affect.  Labs    CBC Recent Labs    05/13/22 1100 05/13/22 2042 05/15/22 0023  WBC 5.1  --  6.8  HGB 7.0* 9.5* 9.4*  HCT 22.4* 29.4* 29.6*  MCV 94.1  --  93.4  PLT 261  --  962   Basic Metabolic Panel Recent Labs    05/14/22 1043 05/15/22 0233  NA 140 141  K 4.1 3.6  CL 115* 112*  CO2 22 22  GLUCOSE 95 120*  BUN 10 9  CREATININE 0.83 0.82  CALCIUM 8.0* 8.1*    Telemetry    05/15/22 NSR, HR 60-80  - Personally Reviewed  ECG    No New ECG - Personally Reviewed  Radiology    DG Chest Port 1V same Day  Result Date: 05/15/2022 CLINICAL DATA:  Congestive heart failure EXAM: PORTABLE CHEST 1 VIEW COMPARISON:  Chest x-ray dated May 13, 2022 FINDINGS: Cardiomegaly. Diffuse interstitial opacities, slightly worsened when compared with prior. New trace left pleural effusion. No evidence of pneumothorax. IMPRESSION:  1. Worsening pulmonary edema. 2. New trace left pleural effusion. Electronically Signed   By: Yetta Glassman M.D.   On: 05/15/2022 08:30   DG CHEST PORT 1 VIEW  Result Date: 05/13/2022 CLINICAL DATA:  Hypoxia during TAVR. EXAM: PORTABLE CHEST 1 VIEW COMPARISON:  05/12/2022 FINDINGS: The cardiac silhouette, mediastinal and hilar contours within normal limits given the AP projection and portable technique. Mild old central vascular congestion and slight increased interstitial markings with Kerley B-lines noted suggesting mild interstitial edema. No pleural effusions. No pneumothorax. IMPRESSION: Mild interstitial edema. Electronically Signed   By: Marijo Sanes M.D.   On: 05/13/2022 11:17   ECHOCARDIOGRAM LIMITED  Result Date: 05/13/2022    ECHOCARDIOGRAM LIMITED REPORT   Patient Name:   Haley Trevino Date of Exam: 05/13/2022 Medical Rec #:  [id number]          Height:       62.0 in Accession #:     [id number]         Weight:       99.0 lb Date of Birth:  December 13, 1940          BSA:          1.418 m Patient Age:    81 years           BP:           209/70 mmHg Patient Gender: F                  HR:           88 bpm. Exam Location:  Inpatient Procedure: Limited Echo, Color Doppler and Cardiac Doppler Indications:     Aortic Stenosis i35.0  History:         Patient has prior history of Echocardiogram examinations, most                  recent 03/20/2022. Risk Factors:Hypertension and Dyslipidemia.  Sonographer:     Raquel Sarna Senior RDCS Referring Phys:  [place]: Sanda Klein MD IMPRESSIONS  1. Left ventricular ejection fraction, by estimation, is 70 to 75%. The left ventricle has hyperdynamic function. The left ventricle has no regional wall motion abnormalities. There is mild concentric left ventricular hypertrophy.  2. Right ventricular systolic function is normal. The right ventricular size is normal. There is moderately elevated pulmonary artery systolic pressure. The estimated right ventricular systolic pressure is 38.2 mmHg.  3. Left atrial size was severely dilated.  4. The mitral valve is degenerative. Trivial mitral valve regurgitation. Mild to moderate mitral stenosis. The mean mitral valve gradient is 9.9 mmHg with average heart rate of 85 bpm. Severe mitral annular calcification.  5. There is severe calcifcation of the aortic valve. There is severe thickening of the aortic valve. Severe aortic valve stenosis.  6. The inferior vena cava is normal in size with greater than 50% respiratory variability, suggesting right atrial pressure of 3 mmHg. Comparison(s): Prior images reviewed side by side. PA pressure is higher. Otherwise little change. FINDINGS  Left Ventricle: Left ventricular ejection fraction, by estimation, is 70 to 75%. The left ventricle has hyperdynamic function. The left ventricle has no regional wall motion abnormalities. There is mild concentric left  ventricular hypertrophy. Right Ventricle: The right ventricular size is normal. Right vetricular wall thickness was not well visualized. Right ventricular systolic function is normal. There is moderately elevated pulmonary artery systolic pressure. The tricuspid regurgitant velocity is 3.54 m/s, and with an assumed right  atrial pressure of 3 mmHg, the estimated right ventricular systolic pressure is 29.5 mmHg. Left Atrium: Left atrial size was severely dilated. Right Atrium: Right atrial size was normal in size. Mitral Valve: The mitral valve is degenerative in appearance. Severe mitral annular calcification. Trivial mitral valve regurgitation. Mild to moderate mitral valve stenosis. MV peak gradient, 20.8 mmHg. The mean mitral valve gradient is 9.9 mmHg with average heart rate of 85 bpm. Tricuspid Valve: The tricuspid valve is normal in structure. Tricuspid valve regurgitation is mild. Aortic Valve: There is severe calcifcation of the aortic valve. There is severe thickening of the aortic valve. Severe aortic stenosis is present. Aortic valve mean gradient measures 39.4 mmHg. Aortic valve peak gradient measures 72.6 mmHg. Aortic valve area, by VTI measures 0.93 cm. Pulmonic Valve: The pulmonic valve was grossly normal. Pulmonic valve regurgitation is not visualized. Venous: The inferior vena cava is normal in size with greater than 50% respiratory variability, suggesting right atrial pressure of 3 mmHg. IAS/Shunts: The interatrial septum was not well visualized. Additional Comments: Spectral Doppler performed. Color Doppler performed.  LEFT VENTRICLE PLAX 2D LVOT diam:     1.90 cm LV SV:         82 LV SV Index:   58 LVOT Area:     2.84 cm  AORTIC VALVE AV Area (Vmax):    0.92 cm AV Area (Vmean):   0.89 cm AV Area (VTI):     0.93 cm AV Vmax:           426.02 cm/s AV Vmean:          290.130 cm/s AV VTI:            0.879 m AV Peak Grad:      72.6 mmHg AV Mean Grad:      39.4 mmHg LVOT Vmax:         138.98 cm/s  LVOT Vmean:        91.347 cm/s LVOT VTI:          0.289 m LVOT/AV VTI ratio: 0.33 MITRAL VALVE              TRICUSPID VALVE MV Area (PHT): 2.29 cm   TR Peak grad:   50.1 mmHg MV Area VTI:   1.59 cm   TR Vmax:        354.00 cm/s MV Peak grad:  20.8 mmHg MV Mean grad:  9.9 mmHg   SHUNTS MV Vmax:       2.28 m/s   Systemic VTI:  0.29 m MV VTI:        0.52 m     Systemic Diam: 1.90 cm MV Decel Time: 331 msec Mihai Croitoru MD Electronically signed by Sanda Klein MD Signature Date/Time: 05/13/2022/10:30:51 AM    Final    Structural Heart Procedure  Result Date: 05/13/2022 See surgical note for result.   Cardiac Studies   Aborted TAVR 05/13/22:   Procedure for TAVR aborted when upon lying on operating room table, significant hypoxia was noted. Pt had bedside echo without any difference from outpt echo. Hemodynamics were stable. Was noted to also have decrease from preop Hgb from over 8 to just above 7. Pt has chronic anemia In discussions with anesthesia, Dr Angelena Form and Dr Ali Lowe, it was felt best to not proceed with TAVR at this time but to cancel case and admit for work up of hypoxia and to see if diuresis and transfusions necessary prior to proceeding.    TTE (03/2022)  IMPRESSIONS    1. The aortic valve is tricuspid. There is severe calcifcation of the  aortic valve. There is severe thickening of the aortic valve. Aortic valve  regurgitation is trivial. Likely severe aortic valve stenosis with aortic  valve mean gradient of 48.5 mmHg  and Vmax 4.38 m/s. The patient is also hyperdynamic which contributes to  the elevated gradients. The aortic valve area and DI calculate in the  moderate range with AVA 1.1cm2, DI 0.35 (LVOT VTI 35). Visually, however,  the valve appears severely stenosed.   2. Left ventricular ejection fraction, by estimation, is >75%. The left  ventricle has hyperdynamic function. The left ventricle has no regional  wall motion abnormalities. There is moderate concentric  left ventricular  hypertrophy. Left ventricular  diastolic parameters are consistent with Grade II diastolic dysfunction  (pseudonormalization).   3. Right ventricular systolic function is normal. The right ventricular  size is normal. There is mildly elevated pulmonary artery systolic  pressure.   4. Left atrial size was severely dilated.   5. Right atrial size was mildly dilated.   6. The mitral valve is degenerative. Trivial mitral valve regurgitation.  Mild to moderate mitral stenosis. Severe mitral annular calcification. MVA  by PHT is 1.9cm2 and by continuity is 1.75cm2. Mean gradient is 76mHg at  HR 70bpm.   7. The inferior vena cava is normal in size with greater than 50%  respiratory variability, suggesting right atrial pressure of 3 mmHg.   Comparison(s): Compared to prior TTE report in 2019, the AS has progressed  from mild to severe and there is now mild-to-moderate calcific MS.     Patient Profile     JSaphronia Ozdemiris an 82yoF with a hx of scleroderma and Raynaud's on chronic Revatio therapy, HLD, iron deficiency anemia managed with intermittent iron infusion, chronic diastolic CHF with elevated LVEDP on cath, moderate mitral stenosis due to MAC, and severe AS [facility] however was found to be hypoxic on anesthesia induction and case was aborted.  She was admitted for acute respiratory failure and acute anemia.   Assessment & Plan    Acute respiratory failure with hypoxia: [facility] found to be hypoxic on anesthesia induction with O2 saturations in the 70's. Case aborted when upon lying on operating room table. Bedside echocardiogram with no significant change from prior imaging. Hemodynamics were stable. Patient has been requiring HFNC that has been weaned down over the last 24 hours. Patient is currently on 6L [place]. Will continue to wean as tolerated.  Patient feels hypoxia is anxiety driven..  May require supplemental home O2 at discharge. Repeat chest x-ray showed worsening pulmonary edema and new trace left plural effusion. Will give IV lasix today and reassess tomorrow.    Chronic iron deficiency anemia: Follows closely with GI and hematology. She receives Q3 week iron infusions, last infusion was Friday prior to scheduled TAVR. Reports intermittent blood tinged stool however felt to be secondary to hemorrhoids. Transfused 1 unit PRBC yesterday with Hb at 9.5>>9.4. Plan for close follow up with hematology for optimization prior to rescheduling TAVR.  Anxiety: Chronic long term anxiety typically controlled with klonopin, Prozac. Feels she was extremely anxious coming in for her procedure and likely contributing to her hypoxia. Patient had acute anxiety episode overnight 05/14/22. No acute episode last night.  Continue current antianxiety regimen.     Hyperkalemia: K+ 3.6 this AM. May need to add  supplementation to her home regimen.    Severe aortic stenosis: Presented for TAVR 05/13/22 with case aborted due to hypoxia. Will need to discuss with the team about timing of procedure. Likely will need to be seen by hem and possibly GI for optimization before re-attempt.    Chronic diastolic CHF: On home Lasix 62m. Does not appear volume overloaded on exam. However repeat chest x-ray showed worsening pulmonary edema and new trace left plural effusion   Moderate mitral stenosis with MAC: Continue to follow with surveillance imaging.   Signed, MHessie Dibble RN, Nurse Practitioner Student.  05/15/2022, 8:47 AM   Patient seen, examined. Available data reviewed. Agree with findings, assessment, and plan as outlined by MEllsworth Lennox RN, NP-Student.  The patient is independently interviewed and examined.  Her husband is at the bedside.  On my exam, she is a pleasant elderly woman in no distress.  JVP is normal, lungs with inspiratory crackles in the bases bilaterally, heart is regular rate and rhythm  with a harsh 3/6 crescendo decrescendo murmur at the right upper sternal border, abdomen is soft and nontender, extremities without edema.  The patient's chest x-ray is reviewed and shows worsening pulmonary edema in spite of IV furosemide yesterday.  Intake and output is not recorded.  Will increase IV furosemide to 40 mg twice daily today, record strict INO's, repeat metabolic panel tomorrow morning, replete potassium, wean oxygen as tolerated.  Reviewed situation with the patient and her husband.  While she is eager for discharge, she is not close to being ready based on her respiratory status.  Even as we tune her up, she may require home O2.  I think her TAVR will need to be expedited as she has severe aortic stenosis and acute on chronic diastolic heart failure somewhat refractory to medical therapy.  MSherren Mocha M.D. 05/15/2022 10:41 AM

## 2022-05-16 DIAGNOSIS — I5033 Acute on chronic diastolic (congestive) heart failure: Secondary | ICD-10-CM | POA: Diagnosis not present

## 2022-05-16 DIAGNOSIS — J9601 Acute respiratory failure with hypoxia: Secondary | ICD-10-CM | POA: Diagnosis not present

## 2022-05-16 DIAGNOSIS — E875 Hyperkalemia: Secondary | ICD-10-CM | POA: Diagnosis not present

## 2022-05-16 DIAGNOSIS — D509 Iron deficiency anemia, unspecified: Secondary | ICD-10-CM | POA: Diagnosis not present

## 2022-05-16 LAB — CBC
HCT: 33.6 % — ABNORMAL LOW (ref 36.0–46.0)
Hemoglobin: 10.4 g/dL — ABNORMAL LOW (ref 12.0–15.0)
MCH: 29.6 pg (ref 26.0–34.0)
MCHC: 31 g/dL (ref 30.0–36.0)
MCV: 95.7 fL (ref 80.0–100.0)
Platelets: 283 10*3/uL (ref 150–400)
RBC: 3.51 MIL/uL — ABNORMAL LOW (ref 3.87–5.11)
RDW: 17.5 % — ABNORMAL HIGH (ref 11.5–15.5)
WBC: 10.4 10*3/uL (ref 4.0–10.5)
nRBC: 0 % (ref 0.0–0.2)

## 2022-05-16 LAB — BASIC METABOLIC PANEL
Anion gap: 7 (ref 5–15)
Anion gap: 8 (ref 5–15)
BUN: 13 mg/dL (ref 8–23)
BUN: 15 mg/dL (ref 8–23)
CO2: 24 mmol/L (ref 22–32)
CO2: 22 mmol/L (ref 22–32)
Calcium: 8.4 mg/dL — ABNORMAL LOW (ref 8.9–10.3)
Calcium: 8.4 mg/dL — ABNORMAL LOW (ref 8.9–10.3)
Chloride: 108 mmol/L (ref 98–111)
Chloride: 108 mmol/L (ref 98–111)
Creatinine, Ser: 0.9 mg/dL (ref 0.44–1.00)
Creatinine, Ser: 0.81 mg/dL (ref 0.44–1.00)
GFR, Estimated: >60 mL/min (ref >60)
GFR, Estimated: >60 mL/min (ref >60)
Glucose, Bld: 133 mg/dL — ABNORMAL HIGH (ref 70–99)
Glucose, Bld: 104 mg/dL — ABNORMAL HIGH (ref 70–99)
Potassium: 5.8 mmol/L — ABNORMAL HIGH (ref 3.5–5.1)
Potassium: 5.3 mmol/L — ABNORMAL HIGH (ref 3.5–5.1)
Sodium: 139 mmol/L (ref 135–145)
Sodium: 138 mmol/L (ref 135–145)

## 2022-05-16 NOTE — Plan of Care (Signed)
Improving knowledge of health issues with plan of care and goals.

## 2022-05-16 NOTE — Progress Notes (Addendum)
[place] VALVE TEAM  Patient Name: Haley Trevino Date of Encounter: 05/16/2022  Primary Cardiologist: Dr. Percival Trevino, [facility] Problem List     Principal Problem:   Aortic stenosis Active Problems:   Haley Trevino   Hypertension   Hyperlipidemia with target LDL less than 100   Acute on chronic diastolic heart failure (HCC)   Acute respiratory distress   Anxiety   [name]   Hyperkalemia   Subjective   Feeling well, has been getting up to go to the bathroom with no SOB. Weaned from 6L [place] to 4 L [place] while in the room. Pt tolerated well. Pt states she was able to sleep flat last night without the "oxygen monitor" beeping all night.    Inpatient Medications    Scheduled Meds:  acetaminophen  1,000 mg Oral QHS   cycloSPORINE  1 drop Both Eyes BID   ezetimibe  10 mg Oral Daily   FLUoxetine  40 mg Oral Daily   furosemide  40 mg Intravenous BID   heparin  5,000 Units Subcutaneous Q8H   levothyroxine  75 mcg Oral Q0600   NIFEdipine  60 mg Oral Daily   pantoprazole  40 mg Oral Daily   sildenafil  20 mg Oral BID   sodium chloride flush  3 mL Intravenous Q12H   Continuous Infusions:  sodium chloride     PRN Meds: sodium chloride, acetaminophen, clonazePAM, ondansetron (ZOFRAN) IV, sodium chloride flush   Vital Signs    Vitals:   05/15/22 2356 05/16/22 0449 05/16/22 0604 05/16/22 0900  BP: (!) 109/51 (!) 117/58  121/68  Pulse: 77 66  70  Resp: _0 Temp: 98.3 F (36.8 C) 97.9 F (36.6 C)    TempSrc: Oral Oral    SpO2: 94% 96%  96%  Weight:   44 kg   Height:        Intake/Output Summary (Last 24 hours) at 05/16/2022 0929 Last data filed at 05/15/2022 1700 Gross per 24 hour  Intake --  Output 1950 ml  Net -1950 ml   Filed Weights   05/14/22 0307 05/15/22 0500 05/16/22 0604  Weight: 45.5 kg 45.3 kg 44 kg   Physical Exam    GEN: Well nourished, well developed, in no acute  distress.  Cardiac: RRR with S1 S2, harsh, loud systolic murmur.   Respiratory:  Respirations regular and unlabored, bilateral crackles in bases, otherwise clear. GI: Soft, nontender, nondistended Extremities: no edema  Skin: warm and dry, no rash. Psych: AAOx3.  Normal affect.  Labs    CBC Recent Labs    05/13/22 1100 05/13/22 2042 05/15/22 0023  WBC 5.1  --  6.8  HGB 7.0* 9.5* 9.4*  HCT 22.4* 29.4* 29.6*  MCV 94.1  --  93.4  PLT 261  --  761   Basic Metabolic Panel Recent Labs    05/15/22 0233 05/16/22 0016  NA 141 139  K 3.6 5.8*  CL 112* 108  CO2 22 24  GLUCOSE 120* 133*  BUN 9 13  CREATININE 0.82 0.90  CALCIUM 8.1* 8.4*   Telemetry    NSR HR 60-80 - Personally Reviewed  ECG    No new ECG - Personally Reviewed  Radiology    DG Chest Port 1V same Day  Result Date: 05/15/2022 CLINICAL DATA:  Congestive heart failure EXAM: PORTABLE CHEST 1 VIEW COMPARISON:  Chest x-ray dated May 13, 2022 FINDINGS:  Cardiomegaly. Diffuse interstitial opacities, slightly worsened when compared with prior. New trace left pleural effusion. No evidence of pneumothorax. IMPRESSION: 1. Worsening pulmonary edema. 2. New trace left pleural effusion. Electronically Signed   By: Haley Glassman M.D.   On: 05/15/2022 08:30    Cardiac Studies   Aborted TAVR 05/13/22:   Procedure for TAVR aborted when upon lying on operating room table, significant hypoxia was noted. Pt had bedside echo without any difference from outpt echo. Hemodynamics were stable. Was noted to also have decrease from preop Hgb from over 8 to just above 7. Pt has chronic anemia In discussions with anesthesia, Dr Haley Trevino and Dr Haley Trevino, it was felt best to not proceed with TAVR at this time but to cancel case and admit for work up of hypoxia and to see if diuresis and transfusions necessary prior to proceeding.    TTE (03/2022)  IMPRESSIONS    1. The aortic valve is tricuspid. There is severe calcifcation of the   aortic valve. There is severe thickening of the aortic valve. Aortic valve  regurgitation is trivial. Likely severe aortic valve stenosis with aortic  valve mean gradient of 48.5 mmHg  and Vmax 4.38 m/s. The patient is also hyperdynamic which contributes to  the elevated gradients. The aortic valve area and DI calculate in the  moderate range with AVA 1.1cm2, DI 0.35 (LVOT VTI 35). Visually, however,  the valve appears severely stenosed.   2. Left ventricular ejection fraction, by estimation, is >75%. The left  ventricle has hyperdynamic function. The left ventricle has no regional  wall motion abnormalities. There is moderate concentric left ventricular  hypertrophy. Left ventricular  diastolic parameters are consistent with Grade II diastolic dysfunction  (pseudonormalization).   3. Right ventricular systolic function is normal. The right ventricular  size is normal. There is mildly elevated pulmonary artery systolic  pressure.   4. Left atrial size was severely dilated.   5. Right atrial size was mildly dilated.   6. The mitral valve is degenerative. Trivial mitral valve regurgitation.  Mild to moderate mitral stenosis. Severe mitral annular calcification. MVA  by PHT is 1.9cm2 and by continuity is 1.75cm2. Mean gradient is 52mHg at  HR 70bpm.   7. The inferior vena cava is normal in size with greater than 50%  respiratory variability, suggesting right atrial pressure of 3 mmHg.   Comparison(s): Compared to prior TTE report in 2019, the AS has progressed  from mild to severe and there is now mild-to-moderate calcific MS.    Patient Profile     Haley Trevino an 88yoF with a hx of scleroderma and Haley on chronic Revatio therapy, HLD, iron deficiency anemia managed with intermittent iron infusion, chronic diastolic CHF with elevated LVEDP on cath, moderate mitral stenosis due to MAC, and severe AS [facility] however was found to be  hypoxic on anesthesia induction and case was aborted.  She was admitted for acute respiratory failure and acute anemia.    Assessment & Plan    Acute respiratory failure with hypoxia: [facility] found to be hypoxic on anesthesia induction with O2 saturations in the 70's.  Case aborted when upon lying on operating room table. Bedside echocardiogram with no significant change from prior imaging. Hemodynamics were stable. Patient initally required HFNC and has been weaned down to 4L.  Will continue to wean as tolerated.  Patient feels hypoxia is anxiety driven.. May  require supplemental home O2 at discharge. IV lasix was given yesterday for worsening pulmonary edema and left pleural effusion on x-ray with improvement in oxygen requirements. Continue IV lasix BID. Continues to have NYHA class III symptoms.   Chronic iron deficiency anemia: Follows closely with GI and hematology. She receives Q3 week iron infusions, last infusion was Friday prior to scheduled TAVR. Reports intermittent blood tinged stool however felt to be secondary to hemorrhoids. Transfused 1 unit PRBC with Hb at 9.5>>9.4. Likely plan to transfuse an additional unit if not optimized before TAVR.   Anxiety: Chronic long term anxiety typically controlled with klonopin, Prozac. Feels she was extremely anxious coming in for her procedure and likely contributing to her hypoxia. Patient had acute anxiety episode overnight 05/14/22. No acute episode last night.  Continue current antianxiety regimen.    Hyperkalemia: K+ 5.8, suspect sample hemolyzed, will redraw.    Severe aortic stenosis: Presented for TAVR 05/13/22 with case aborted due to hypoxia. Patient is scheduled for TAVR on Tuesday. Will optimize before procedure.    Chronic diastolic CHF: On home Lasix 25m. Does not appear volume overloaded on exam. However repeat chest x-ray showed worsening pulmonary edema and new trace left plural effusion.  Lasix yesterday reduced [place] oxygen requirements. Will continue IV Lasix.    Moderate mitral stenosis with MAC: Continue to follow with surveillance imaging.   Signed, MHessie Dibble RN, Nurse Practitioner Student.  05/16/2022, 9:29 AM   Patient seen, examined. Available data reviewed. Agree with findings, assessment, and plan as outlined by JKathyrn Drown NP.  The patient is independently interviewed and examined.  Her husband is at the bedside.  She is alert, oriented, no distress.  HEENT is normal, JVP is normal, lungs are clear with the exception of decreased breath sounds at the bases, heart is regular rate and rhythm with a 3/6 harsh crescendo decrescendo murmur at the right upper sternal border, abdomen soft nontender, extremities have no edema.  The patient's acute on chronic diastolic heart failure seems to be improving with IV diuresis.  Her O2 has weaned from 6 down to 4 L.  She is breathing more comfortably and is lying flatter in bed.  Will continue furosemide 40 mg IV twice daily today, repeat a portable chest x-ray tomorrow morning, and likely be able to reduce her diuretic dosage tomorrow.  We have decided to keep her as an inpatient for TAVR this coming Tuesday.  We have added her on is 5th case.  Explained the plan to the patient and her husband who are agreeable.  She is not medically stable to go home especially from a respiratory perspective.  Her anemia has been stable with a hemoglobin of 9.4 today.  We will continue to watch her blood count with serial CBCs (check Sunday am).  MSherren Mocha M.D. 05/16/2022 12:58 PM

## 2022-05-16 NOTE — Care Management Important Message (Signed)
Important Message  Patient Details  Name: Haley Trevino MRN: [id number] Date of Birth: 04-18-1941   Medicare Important Message Given:  Yes     Orbie Pyo 05/16/2022, 4:01 PM

## 2022-05-16 NOTE — Progress Notes (Signed)
Mobility Specialist Progress Note    05/16/22 1510  Mobility  Activity Ambulated with assistance in hallway  Level of Assistance Contact guard assist, steadying assist  Assistive Device Other (Comment) (HHA)  Distance Ambulated (ft) 220 ft  Activity Response Tolerated well  Mobility Referral Yes  $Mobility charge 1 Mobility   Pre-Mobility: 84 HR, 93% SpO2 on 3LO2 During Mobility: 114 HR, 88-90% SpO2 on 2LO2 Post-Mobility: 93 HR, 93% SpO2 on 2LO2  Pt received in bed and agreeable. C/o fatigue from bath. Tolerated on 2LO2. Returned to sitting EOB with call bell in reach. Left on 2LO2. RN aware.   Hildred Alamin Mobility Specialist  Please Psychologist, sport and exercise or Rehab Office at [phone]

## 2022-05-17 ENCOUNTER — Inpatient Hospital Stay (HOSPITAL_COMMUNITY): Payer: Medicare Other

## 2022-05-17 DIAGNOSIS — D509 Iron deficiency anemia, unspecified: Secondary | ICD-10-CM | POA: Diagnosis not present

## 2022-05-17 DIAGNOSIS — I350 Nonrheumatic aortic (valve) stenosis: Secondary | ICD-10-CM

## 2022-05-17 DIAGNOSIS — I5033 Acute on chronic diastolic (congestive) heart failure: Secondary | ICD-10-CM | POA: Diagnosis not present

## 2022-05-17 DIAGNOSIS — I050 Rheumatic mitral stenosis: Secondary | ICD-10-CM | POA: Diagnosis not present

## 2022-05-17 LAB — BASIC METABOLIC PANEL
Anion gap: 9 (ref 5–15)
BUN: 16 mg/dL (ref 8–23)
CO2: 27 mmol/L (ref 22–32)
Calcium: 8.6 mg/dL — ABNORMAL LOW (ref 8.9–10.3)
Chloride: 104 mmol/L (ref 98–111)
Creatinine, Ser: 0.87 mg/dL (ref 0.44–1.00)
GFR, Estimated: >60 mL/min (ref >60)
Glucose, Bld: 101 mg/dL — ABNORMAL HIGH (ref 70–99)
Potassium: 4.2 mmol/L (ref 3.5–5.1)
Sodium: 140 mmol/L (ref 135–145)

## 2022-05-17 NOTE — Progress Notes (Signed)
Progress Note  Patient Name: Haley Trevino Date of Encounter: 05/17/2022  Primary Cardiologist: None  Subjective   No acute events overnight, her oxygen requirements have come down from 8 L to 3 L currently.  Her symptoms are improving.  Inpatient Medications    Scheduled Meds:  acetaminophen  1,000 mg Oral QHS   cycloSPORINE  1 drop Both Eyes BID   ezetimibe  10 mg Oral Daily   FLUoxetine  40 mg Oral Daily   furosemide  40 mg Intravenous BID   heparin  5,000 Units Subcutaneous Q8H   levothyroxine  75 mcg Oral Q0600   NIFEdipine  60 mg Oral Daily   pantoprazole  40 mg Oral Daily   sildenafil  20 mg Oral BID   sodium chloride flush  3 mL Intravenous Q12H   Continuous Infusions:  sodium chloride     PRN Meds: sodium chloride, acetaminophen, clonazePAM, ondansetron (ZOFRAN) IV, sodium chloride flush   Vital Signs    Vitals:   05/16/22 2023 05/17/22 0030 05/17/22 0421 05/17/22 0740  BP: 112/79 (!) 131/56 (!) 143/63 (!) 139/55  Pulse: 79 62 64 66  Resp: _0 Temp: 99.1 F (37.3 C) 98.3 F (36.8 C) 98.2 F (36.8 C) 99.3 F (37.4 C)  TempSrc: Oral Oral Oral Oral  SpO2: 94% 94% 95% 95%  Weight:      Height:       No intake or output data in the 24 hours ending 05/17/22 1004 Filed Weights   05/14/22 0307 05/15/22 0500 05/16/22 0604  Weight: 45.5 kg 45.3 kg 44 kg    Telemetry     Personally reviewed, heart rates controlled and in normal sinus rhythm  ECG    An ECG dated 05/12/22 was personally reviewed today and demonstrated:  Normal sinus rhythm  Physical Exam   GEN: No acute distress.   Neck: No JVD elevated till halfway of her neck Cardiac: RRR, no murmur, rub, or gallop.  Respiratory: Nonlabored. Clear to auscultation bilaterally. GI: Soft, nontender, bowel sounds present. MS: Trace to 1+ pitting edema in LE till knee level; No deformity. Neuro:  Nonfocal. Psych: Alert and oriented x 3. Normal affect.  Labs    Chemistry Recent  Labs  Lab 05/12/22 0830 05/13/22 0757 05/16/22 0016 05/16/22 1003 05/17/22 0012  NA 139   < > 139 138 140  K 3.2*   < > 5.8* 5.3* 4.2  CL 109   < > 108 108 104  CO2 23   < > _1 GLUCOSE 100*   < > 133* 104* 101*  BUN 9   < > _2 CREATININE 0.90   < > 0.90 0.81 0.87  CALCIUM 8.7*   < > 8.4* 8.4* 8.6*  PROT 6.7  --   --   --   --   ALBUMIN 3.5  --   --   --   --   AST 20  --   --   --   --   ALT 12  --   --   --   --   ALKPHOS 42  --   --   --   --   BILITOT 0.4  --   --   --   --   GFRNONAA >60   < > >60 >60 >60  ANIONGAP 7   < > _3 < > = values in this interval not displayed.  Hematology Recent Labs  Lab 05/13/22 1100 05/13/22 2042 05/15/22 0023 05/16/22 1003  WBC 5.1  --  6.8 10.4  RBC 2.38*  --  3.17* 3.51*  HGB 7.0* 9.5* 9.4* 10.4*  HCT 22.4* 29.4* 29.6* 33.6*  MCV 94.1  --  93.4 95.7  MCH 29.4  --  29.7 29.6  MCHC 31.3  --  31.8 31.0  RDW 17.0*  --  18.6* 17.5*  PLT 261  --  251 283    Cardiac EnzymesNo results for input(s): "TROPONINIHS" in the last 720 hours.  BNPNo results for input(s): "BNP", "PROBNP" in the last 168 hours.   DDimerNo results for input(s): "DDIMER" in the last 168 hours.   Radiology    DG Chest Port 1V same Day  Result Date: 05/17/2022 CLINICAL DATA:  Congestive heart failure. EXAM: PORTABLE CHEST 1 VIEW COMPARISON:  05/15/2022 FINDINGS: Stable cardiomediastinal contours. Aortic atherosclerotic calcifications. Mild interstitial edema pattern is improved compared with the previous exam. Decrease in pleural effusions. No airspace consolidation. IMPRESSION: Interval improvement in CHF pattern. Electronically Signed   By: Kerby Moors M.D.   On: 05/17/2022 07:32    Cardiac Studies   Echo in November 2023 LVEF 70-75%.  Indeterminate diastology. Moderate MS from MAC Severe aortic valve stenosis   Assessment & Plan    Patient is a 81 year old F known to have scleroderma and [name] on chronic revatio  therapy, chronic diastolic heart failure, moderate mitral valve stenosis due to MAC, severe aortic valve stenosis who presented to [facility] for elective TAVR on 05/13/2022 however was found to be hypoxic on anesthesia induction and case was aborted.  She was admitted for acute hypoxic respiratory failure and currently on IV Lasix for diuresis.  She is posted for TAVR on next week Tuesday.  #Acute hypoxic respiratory failure likely secondary to acute on chronic diastolic heart failure #Acute on chronic diastolic heart failure exacerbation -Patient has JVD elevated till halfway of her neck, clear lungs, has 1+ pitting edema in lower extremities and currently on 3 L of nasal cannula oxygen (not on any oxygen at home). We will continue IV Lasix 40 mg twice daily. Goal is to wean off nasal cannula oxygen and switch to p.o. Lasix at that time when she is compensated.  #Severe aortic valve stenosis -Patient is posted for TAVR on next Tuesday.  She will need to be optimized prior to the procedure.  #Moderate mitral valve stenosis from MAC -Monitor mitral valve stenosis on an outpatient basis -Patient will need Coumadin therapy and not DOACs in the future if she requires systemic anticoagulation.  #Chronic iron deficiency anemia (Q3 week iron infusions) -Patient might need additional unit of blood transfusion prior to TAVR.  Today hemoglobin stable at 10.4 from 05/16/2022.  I have spent a total of 35 minutes with patient reviewing chart , telemetry, EKGs, labs and examining patient as well as establishing an assessment and plan that was discussed with the patient.  > 50% of time was spent in direct patient care.     Signed, Chalmers Guest, MD  05/17/2022, 10:04 AM

## 2022-05-17 NOTE — Progress Notes (Signed)
Mobility Specialist Progress Note:   05/17/22 1023  Mobility  Activity Ambulated with assistance in hallway  Level of Assistance Standby assist, set-up cues, supervision of patient - no hands on  Assistive Device None  Distance Ambulated (ft) 350 ft  Activity Response Tolerated well  Mobility Referral Yes  $Mobility charge 1 Mobility   Pt received in bed and agreeable. No complaints. Pt left in bed with all needs met and call bell in reach.   Haley Trevino Mobility Specialist-Acute Rehab Secure Chat only

## 2022-05-18 DIAGNOSIS — I350 Nonrheumatic aortic (valve) stenosis: Secondary | ICD-10-CM | POA: Diagnosis not present

## 2022-05-18 DIAGNOSIS — D509 Iron deficiency anemia, unspecified: Secondary | ICD-10-CM | POA: Diagnosis not present

## 2022-05-18 DIAGNOSIS — I050 Rheumatic mitral stenosis: Secondary | ICD-10-CM | POA: Diagnosis not present

## 2022-05-18 DIAGNOSIS — I5033 Acute on chronic diastolic (congestive) heart failure: Secondary | ICD-10-CM | POA: Diagnosis not present

## 2022-05-18 LAB — CBC
HCT: 37.2 % (ref 36.0–46.0)
Hemoglobin: 11.2 g/dL — ABNORMAL LOW (ref 12.0–15.0)
MCH: 28.9 pg (ref 26.0–34.0)
MCHC: 30.1 g/dL (ref 30.0–36.0)
MCV: 96.1 fL (ref 80.0–100.0)
Platelets: 325 10*3/uL (ref 150–400)
RBC: 3.87 MIL/uL (ref 3.87–5.11)
RDW: 17.1 % — ABNORMAL HIGH (ref 11.5–15.5)
WBC: 6.4 10*3/uL (ref 4.0–10.5)
nRBC: 0 % (ref 0.0–0.2)

## 2022-05-18 LAB — BASIC METABOLIC PANEL
Anion gap: 8 (ref 5–15)
BUN: 20 mg/dL (ref 8–23)
CO2: 29 mmol/L (ref 22–32)
Calcium: 8.9 mg/dL (ref 8.9–10.3)
Chloride: 100 mmol/L (ref 98–111)
Creatinine, Ser: 1.09 mg/dL — ABNORMAL HIGH (ref 0.44–1.00)
GFR, Estimated: 51 mL/min — ABNORMAL LOW (ref >60)
Glucose, Bld: 108 mg/dL — ABNORMAL HIGH (ref 70–99)
Potassium: 5.4 mmol/L — ABNORMAL HIGH (ref 3.5–5.1)
Sodium: 137 mmol/L (ref 135–145)

## 2022-05-18 MED ORDER — FUROSEMIDE 40 MG PO TABS
40.0000 mg | ORAL_TABLET | Freq: Every day | ORAL | Status: DC
  Administered 2022-05-18 – 2022-05-19 (×2): 40 mg via ORAL
  Filled 2022-05-18 (×2): qty 1

## 2022-05-18 NOTE — Progress Notes (Signed)
[place] VALVE TEAM  Patient Name: Haley Trevino Date of Encounter: 05/18/2022  Admit date: 05/13/2022  Primary Care Provider: [name], [facility] Cardiologist: [name], MD  [facility] Electrophysiologist:  None   [facility] Problem List     Principal Problem:   Aortic stenosis Active Problems:   [name]   Hypertension   Hyperlipidemia with target LDL less than 100   Acute on chronic diastolic heart failure (HCC)   Acute respiratory distress   Anxiety   [name]   Hyperkalemia     Subjective   Breathing much better. Walked the halls his AM. Husband at bedside.   Inpatient Medications    Scheduled Meds:  acetaminophen  1,000 mg Oral QHS   cycloSPORINE  1 drop Both Eyes BID   ezetimibe  10 mg Oral Daily   FLUoxetine  40 mg Oral Daily   furosemide  40 mg Oral Daily   heparin  5,000 Units Subcutaneous Q8H   levothyroxine  75 mcg Oral Q0600   NIFEdipine  60 mg Oral Daily   pantoprazole  40 mg Oral Daily   sildenafil  20 mg Oral BID   sodium chloride flush  3 mL Intravenous Q12H   Continuous Infusions:  sodium chloride     PRN Meds: sodium chloride, acetaminophen, clonazePAM, ondansetron (ZOFRAN) IV, sodium chloride flush   Vital Signs    Vitals:   05/17/22 1940 05/17/22 2311 05/18/22 0310 05/18/22 0721  BP: 132/64 (!) 120/48 (!) 124/44 (!) 129/56  Pulse: 72 70 65 75  Resp: _0 Temp: 98.2 F (36.8 C) 98.2 F (36.8 C)  97.9 F (36.6 C)  TempSrc: Oral Oral  Axillary  SpO2: 96% 96% 98% 94%  Weight:      Height:        Intake/Output Summary (Last 24 hours) at 05/18/2022 0930 Last data filed at 05/18/2022 0800 Gross per 24 hour  Intake 485 ml  Output 1200 ml  Net -715 ml   Filed Weights   05/14/22 0307 05/15/22 0500 05/16/22 0604  Weight: 45.5 kg 45.3 kg 44 kg    Physical Exam    GEN: Well nourished, well developed, in no  acute distress.  HEENT: Grossly normal.  Neck: Supple, no JVD, carotid bruits, or masses. Cardiac: RRR,  3/6 harsh crescendo decrescendo murmur at the right upper sternal border. No rubs, or gallops. No clubbing, cyanosis, edema.   Respiratory: mild crackles at bases, bilaterally.  GI: Soft, nontender, nondistended, BS + x 4. MS: no deformity or atrophy. Skin: warm and dry, no rash. Neuro:  Strength and sensation are intact. Psych: AAOx3.  Normal affect.  Labs    CBC Recent Labs    05/16/22 1003 05/18/22 0015  WBC 10.4 6.4  HGB 10.4* 11.2*  HCT 33.6* 37.2  MCV 95.7 96.1  PLT 283 638   Basic Metabolic Panel Recent Labs    05/17/22 0012 05/18/22 0015  NA 140 137  K 4.2 5.4*  CL 104 100  CO2 27 29  GLUCOSE 101* 108*  BUN 16 20  CREATININE 0.87 1.09*  CALCIUM 8.6* 8.9   Liver Function Tests No results for input(s): "AST", "ALT", "ALKPHOS", "BILITOT", "PROT", "ALBUMIN" in the last 72 hours. No results for input(s): "LIPASE", "AMYLASE" in the last 72 hours. Cardiac Enzymes No results for input(s): "CKTOTAL", "CKMB", "CKMBINDEX", "TROPONINI" in the last 72 hours. BNP Invalid input(s): "POCBNP" D-Dimer No results  for input(s): "DDIMER" in the last 72 hours. Hemoglobin A1C No results for input(s): "HGBA1C" in the last 72 hours. Fasting Lipid Panel No results for input(s): "CHOL", "HDL", "LDLCALC", "TRIG", "CHOLHDL", "LDLDIRECT" in the last 72 hours. Thyroid Function Tests No results for input(s): "TSH", "T4TOTAL", "T3FREE", "THYROIDAB" in the last 72 hours.  Invalid input(s): "FREET3"  Telemetry    Sinus with brief run of tachy - Personally Reviewed  ECG    sinus - Personally Reviewed  Radiology    DG Chest Port 1V same Day  Result Date: 05/17/2022 CLINICAL DATA:  Congestive heart failure. EXAM: PORTABLE CHEST 1 VIEW COMPARISON:  05/15/2022 FINDINGS: Stable cardiomediastinal contours. Aortic atherosclerotic calcifications. Mild interstitial edema pattern is  improved compared with the previous exam. Decrease in pleural effusions. No airspace consolidation. IMPRESSION: Interval improvement in CHF pattern. Electronically Signed   By: Kerby Moors M.D.   On: 05/17/2022 07:32    Cardiac Studies   Aborted TAVR 05/13/22:   Procedure for TAVR aborted when upon lying on operating room table, significant hypoxia was noted. Pt had bedside echo without any difference from outpt echo. Hemodynamics were stable. Was noted to also have decrease from preop Hgb from over 8 to just above 7. Pt has chronic anemia In discussions with anesthesia, Dr Angelena Form and Dr Ali Lowe, it was felt best to not proceed with TAVR at this time but to cancel case and admit for work up of hypoxia and to see if diuresis and transfusions necessary prior to proceeding.    TTE (03/2022)  IMPRESSIONS    1. The aortic valve is tricuspid. There is severe calcifcation of the  aortic valve. There is severe thickening of the aortic valve. Aortic valve  regurgitation is trivial. Likely severe aortic valve stenosis with aortic  valve mean gradient of 48.5 mmHg  and Vmax 4.38 m/s. The patient is also hyperdynamic which contributes to  the elevated gradients. The aortic valve area and DI calculate in the  moderate range with AVA 1.1cm2, DI 0.35 (LVOT VTI 35). Visually, however,  the valve appears severely stenosed.   2. Left ventricular ejection fraction, by estimation, is >75%. The left  ventricle has hyperdynamic function. The left ventricle has no regional  wall motion abnormalities. There is moderate concentric left ventricular  hypertrophy. Left ventricular  diastolic parameters are consistent with Grade II diastolic dysfunction  (pseudonormalization).   3. Right ventricular systolic function is normal. The right ventricular  size is normal. There is mildly elevated pulmonary artery systolic  pressure.   4. Left atrial size was severely dilated.   5. Right atrial size was mildly  dilated.   6. The mitral valve is degenerative. Trivial mitral valve regurgitation.  Mild to moderate mitral stenosis. Severe mitral annular calcification. MVA  by PHT is 1.9cm2 and by continuity is 1.75cm2. Mean gradient is 22mHg at  HR 70bpm.   7. The inferior vena cava is normal in size with greater than 50%  respiratory variability, suggesting right atrial pressure of 3 mmHg.   Comparison(s): Compared to prior TTE report in 2019, the AS has progressed  from mild to severe and there is now mild-to-moderate calcific MS.   Patient Profile     JSymantha Steeberis an 89yoF with a hx of scleroderma and Raynaud's on chronic Revatio therapy, HLD, iron deficiency anemia managed with intermittent iron infusion, chronic diastolic CHF with elevated LVEDP on cath, moderate mitral stenosis due to MAC, and severe AS [facility] however was found to be hypoxic on anesthesia induction and case was aborted.  She was admitted for acute respiratory failure and acute anemia.     Assessment & Plan    Acute respiratory failure with hypoxia: patient presented to [facility] for scheduled TAVR 05/13/22 found to be hypoxic on anesthesia induction with O2 saturations in the 70's.  Case aborted when upon lying on operating room table. Bedside echocardiogram with no significant change from prior imaging. Hemodynamics were stable. Patient initally required HFNC and has been weaned down to 3L. Will continue to wean as tolerated.  Patient feels hypoxia is anxiety driven. May require supplemental home O2 at discharge. IV lasix was given for worsening pulmonary edema and left pleural effusion on x-ray with improvement in oxygen requirements and symptoms.  Acute on chronic diastolic CHF: repeat chest x-ray showed worsening pulmonary edema and new trace left plural effusion. She has been treated with IV lasix 2m BID. Net neg 2.2L. Weight 100--> 97 lbs.  Creat starting to bump and she appears  euvolemic.  Will change IV lasix to PO lasix 449mdaily now.    Chronic iron deficiency anemia: follows closely with GI and hematology. She receives Q3 week iron infusions. Transfused 1 unit PRBC. Hg stable at 11.2 today.    Anxiety: chronic long term anxiety typically controlled with klonopin, Prozac. Feels she was extremely anxious coming in for her procedure and likely contributing to her hypoxia. Patient had acute anxiety episode overnight 05/14/22. No acute episode last night.  Continue current antianxiety regimen.    Hyperkalemia: K+ 5.4, has been elevated on several blood draws. Interesting as renal function is normal and not on any potassium elevating drugs. If remains elevated tomorrow, may want to treat with a dose of Lokelma    Severe aortic stenosis: Presented for TAVR 05/13/22 with case aborted due to hypoxia. Patient is rescheduled for TAVR on Tuesday. Will optimize before procedure.    Moderate mitral stenosis with MAC: Continue to follow with surveillance imaging.   Signed, KaAngelena FormPA-C  05/18/2022, 9:30 AM  Pager [phone]

## 2022-05-18 NOTE — Progress Notes (Signed)
Mobility Specialist Progress Note:   05/18/22 0858  Mobility  Activity Ambulated independently in hallway  Level of Assistance Modified independent, requires aide device or extra time  Assistive Device None  Distance Ambulated (ft) 700 ft  Activity Response Tolerated well  Mobility Referral Yes  $Mobility charge 1 Mobility   Pre-mobility: 99% SpO2 on 3LO2 Post-mobility: 98% SpO2 on 3LO2  Pt received in bed and eager. No complaints. Pt left in bed with all needs met, call bell in reach, and family in room.   Andrey Campanile Mobility Specialist Please contact via SecureChat or  Rehab office at [phone]

## 2022-05-19 ENCOUNTER — Other Ambulatory Visit: Payer: Medicare Other

## 2022-05-19 DIAGNOSIS — I5033 Acute on chronic diastolic (congestive) heart failure: Secondary | ICD-10-CM | POA: Diagnosis not present

## 2022-05-19 LAB — URINALYSIS, ROUTINE W REFLEX MICROSCOPIC
Bilirubin Urine: NEGATIVE
Glucose, UA: NEGATIVE mg/dL
Hgb urine dipstick: NEGATIVE
Ketones, ur: NEGATIVE mg/dL
Leukocytes,Ua: NEGATIVE
Nitrite: NEGATIVE
Protein, ur: NEGATIVE mg/dL
Specific Gravity, Urine: 1.008 (ref 1.005–1.030)
pH: 6 (ref 5.0–8.0)

## 2022-05-19 LAB — PROTIME-INR
INR: 0.9 (ref 0.8–1.2)
Prothrombin Time: 11.8 seconds (ref 11.4–15.2)

## 2022-05-19 LAB — BASIC METABOLIC PANEL
Anion gap: 10 (ref 5–15)
BUN: 21 mg/dL (ref 8–23)
CO2: 26 mmol/L (ref 22–32)
Calcium: 8.9 mg/dL (ref 8.9–10.3)
Chloride: 101 mmol/L (ref 98–111)
Creatinine, Ser: 0.91 mg/dL (ref 0.44–1.00)
GFR, Estimated: >60 mL/min (ref >60)
Glucose, Bld: 98 mg/dL (ref 70–99)
Potassium: 4.3 mmol/L (ref 3.5–5.1)
Sodium: 137 mmol/L (ref 135–145)

## 2022-05-19 LAB — CBC
HCT: 36.1 % (ref 36.0–46.0)
Hemoglobin: 11.2 g/dL — ABNORMAL LOW (ref 12.0–15.0)
MCH: 29.9 pg (ref 26.0–34.0)
MCHC: 31 g/dL (ref 30.0–36.0)
MCV: 96.5 fL (ref 80.0–100.0)
Platelets: 291 10*3/uL (ref 150–400)
RBC: 3.74 MIL/uL — ABNORMAL LOW (ref 3.87–5.11)
RDW: 17.3 % — ABNORMAL HIGH (ref 11.5–15.5)
WBC: 5.3 10*3/uL (ref 4.0–10.5)
nRBC: 0 % (ref 0.0–0.2)

## 2022-05-19 MED ORDER — MAGNESIUM SULFATE 50 % IJ SOLN
40.0000 meq | INTRAMUSCULAR | Status: DC
  Filled 2022-05-19: qty 9.85

## 2022-05-19 MED ORDER — HEPARIN 30,000 UNITS/1000 ML (OHS) CELLSAVER SOLUTION
Status: DC
  Filled 2022-05-19: qty 1000

## 2022-05-19 MED ORDER — NOREPINEPHRINE 4 MG/250ML-% IV SOLN
0.0000 ug/min | INTRAVENOUS | Status: AC
  Administered 2022-05-20: 1 ug/min via INTRAVENOUS
  Filled 2022-05-19: qty 250

## 2022-05-19 MED ORDER — TEMAZEPAM 15 MG PO CAPS: 15.0000 mg | ORAL_CAPSULE | Freq: Once | ORAL | Status: DC | PRN

## 2022-05-19 MED ORDER — BISACODYL 5 MG PO TBEC
5.0000 mg | DELAYED_RELEASE_TABLET | Freq: Once | ORAL | Status: DC
  Filled 2022-05-19: qty 1

## 2022-05-19 MED ORDER — DEXMEDETOMIDINE HCL IN NACL 400 MCG/100ML IV SOLN
0.1000 ug/kg/h | INTRAVENOUS | Status: AC
  Administered 2022-05-20: 1 ug/kg/h via INTRAVENOUS
  Filled 2022-05-19: qty 100

## 2022-05-19 MED ORDER — POTASSIUM CHLORIDE 2 MEQ/ML IV SOLN
80.0000 meq | INTRAVENOUS | Status: DC
  Filled 2022-05-19: qty 40

## 2022-05-19 MED ORDER — CHLORHEXIDINE GLUCONATE 0.12 % MT SOLN
15.0000 mL | Freq: Once | OROMUCOSAL | Status: DC
  Filled 2022-05-19: qty 15

## 2022-05-19 MED ORDER — CHLORHEXIDINE GLUCONATE 4 % EX LIQD
1.0000 | Freq: Once | CUTANEOUS | Status: DC
  Filled 2022-05-19: qty 90

## 2022-05-19 MED ORDER — CEFAZOLIN SODIUM-DEXTROSE 2-4 GM/100ML-% IV SOLN
2.0000 g | INTRAVENOUS | Status: AC
  Administered 2022-05-20: 2 g via INTRAVENOUS
  Filled 2022-05-19: qty 100

## 2022-05-19 NOTE — Plan of Care (Signed)

## 2022-05-19 NOTE — H&P (Signed)
[address]       [place] [address]             [phone]      Cardiothoracic Surgery Admission History and Physical   [facility] Record #[id number] Date of Birth: 11/20/1940   Haley Breeding, MD Haley Pretty, FNP   Chief Complaint:   Aortic stenosis   History of Present Illness:     Pt is a pleasant 81 yo female that has been followed for her aortic stenosis for some time. She has been getting significantly more dyspneic the past year and to the point she cannot perform her daily activities without becoming SOB and needing to rest. She occasionally gets lightheaded and felt that her fall several weeks ago may have been related to getting out of bed too quickly. She has chest discomfort that is not exertional but what she feels is related to her breast scar from removed implants. She has had a very extensive GI work up for blood loss anemia without an etiology and has been on iron infusions the past year for therapy. She only had one blood transfusion. She does feel she is having hemrrhoid issues  currently. Her work up had included an echo (see below) with an aortic valve area of 1.1 cm grade, mean gradient of 48 mmHg, and peak velocity 4.38 m/s; ejection fraction of 65 to 70%; no conduction abnormalities on her EKG. She was felt to be best suited for TAVR and has had her cath (without siginifcant CAD) and TAVR CTA that has acceptable anatomy for TAVR.                Past Medical History:  Diagnosis Date   Cataract     Depression     GERD (gastroesophageal reflux disease)     Hyperlipidemia     Hypertension     Hypothyroidism     IBS (irritable bowel syndrome)     Osteoarthritis     Raynaud disease     Scleroderma ([place])     Thyroid disease      hypothyroidism               Past Surgical History:  Procedure Laterality Date   BREAST ENHANCEMENT SURGERY   1975   BREAST IMPLANT REMOVAL Bilateral 04/23/2016     Procedure: REMOVALBILATERAL BREAST IMPLANTS;  Surgeon: Wallace Going, DO;  Location: [facility];  Service: Plastics;  Laterality: Bilateral;   CHOLECYSTECTOMY   1973   ESOPHAGOGASTRODUODENOSCOPY N/A 10/25/2020    Procedure: ESOPHAGOGASTRODUODENOSCOPY (EGD);  Surgeon: Clarene Essex, MD;  Location: [facility];  Service: Endoscopy;  Laterality: N/A;  enteroscopy   HAND SURGERY   08/2012; 11/2012   HOT HEMOSTASIS N/A 10/25/2020    Procedure: HOT HEMOSTASIS (ARGON PLASMA COAGULATION/BICAP);  Surgeon: Clarene Essex, MD;  Location: [facility];  Service: Endoscopy;  Laterality: N/A;   IR RADIOLOGIST EVAL & MGMT   07/27/2017   IR SACROPLASTY BILATERAL   07/31/2017   IR VERTEBROPLASTY CERV/THOR BX INC UNI/BIL INC/INJECT/IMAGING   03/13/2020   IR VERTEBROPLASTY EA ADDL (T&LS) BX INC UNI/BIL INC INJECT/IMAGING   03/14/2020   MELANOMA EXCISION        at 31 yrs of age   ORIF HIP FRACTURE Right 02/22/2014    Procedure: OPEN REDUCTION INTERNAL FIXATION RIGHT HIP;  Surgeon: Sanjuana Kava, MD;  Location: [facility];  Service: Orthopedics;  Laterality: Right;   RIGHT HEART CATH  AND CORONARY ANGIOGRAPHY N/A 03/28/2022    Procedure: RIGHT HEART CATH AND CORONARY ANGIOGRAPHY;  Surgeon: Early Osmond, MD;  Location: [facility];  Service: Cardiovascular;  Laterality: N/A;   TOTAL ABDOMINAL HYSTERECTOMY   1974      Social History           Tobacco Use  Smoking Status Never  Smokeless Tobacco Never  Tobacco Comments    passive tobacco smoke exposure as child    Social History         Substance and Sexual Activity  Alcohol Use No      Social History             Socioeconomic History   Marital status: Married      Spouse name: Not on file   Number of children: 2   Years of education: Not on file   Highest education level: Some college, no degree  Occupational History   Occupation: retired Occupational psychologist at [employer]  Tobacco Use   Smoking status: Never   Smokeless tobacco: Never    Tobacco comments:      passive tobacco smoke exposure as child  Vaping Use   Vaping Use: Never used  Substance and Sexual Activity   Alcohol use: No   Drug use: No   Sexual activity: Not Currently      Birth control/protection: Surgical  Other Topics Concern   Not on file  Social History Narrative    Regular exercise: walkingCaffeine use: 1 cup of coffee daily    Lives in split level home with her husband    Social Determinants of Health           Financial Resource Strain: Black  (04/29/2021)    Overall Financial Resource Strain (CARDIA)     Difficulty of Paying Living Expenses: Not hard at all  Food Insecurity: No Food Insecurity (04/29/2021)    Hunger Vital Sign     Worried About Running Out of Food in the Last Year: Never true     [place] in the Last Year: Never true  Transportation Needs: No Transportation Needs (04/29/2021)    PRAPARE - Armed forces logistics/support/administrative officer (Medical): No     Lack of Transportation (Non-Medical): No  Physical Activity: Inactive (06/14/2021)    Exercise Vital Sign     Days of Exercise per Week: 0 days     Minutes of Exercise per Session: 0 min  Stress: Stress Concern Present (06/14/2021)    [place]     Feeling of Stress : Rather much  Social Connections: Socially Integrated (04/29/2021)    Social Connection and Isolation Panel [NHANES]     Frequency of Communication with Friends and Family: More than three times a week     Frequency of Social Gatherings with Friends and Family: Once a week     Attends Religious Services: 1 to 4 times per year     Active Member of [employer] or Organizations: Yes     Attends Archivist Meetings: 1 to 4 times per year     Marital Status: Married  Human resources officer Violence: Not At Risk (04/29/2021)    Humiliation, Afraid, Rape, and Kick questionnaire     Fear of Current or Ex-Partner: No     Emotionally Abused: No      Physically Abused: No     Sexually Abused: No  Allergies  Allergen Reactions   Bactrim [Sulfamethoxazole-Trimethoprim] Nausea And Vomiting   Dilaudid [Hydromorphone Hcl] Anaphylaxis   Dilaudid [Hydromorphone] Anaphylaxis   Acyclovir And Related        Unknown reaction   Crestor [Rosuvastatin Calcium] Other (See Comments)      Muscle pain   Gabapentin Swelling      Swelling in feet, and in legs   Aleve [Naproxen Sodium] Rash   Lyrica [Pregabalin] Other (See Comments)      Swelling in feet, and in legs                 Current Outpatient Medications  Medication Sig Dispense Refill   acetaminophen (TYLENOL) 500 MG tablet Take 1,000 mg by mouth every 6 (six) hours as needed for moderate pain or headache.       CALCIUM PO Take by mouth.       Cholecalciferol (VITAMIN D) 50 MCG (2000 UT) tablet Take 2,000 Units by mouth daily.       clonazePAM (KLONOPIN) 0.5 MG tablet Take 1 tablet (0.5 mg total) by mouth 2 (two) times daily as needed for anxiety. 180 tablet 1   ezetimibe (ZETIA) 10 MG tablet Take 1 tablet (10 mg total) by mouth daily. 90 tablet 1   FLUoxetine (PROZAC) 40 MG capsule Take 1 capsule (40 mg total) by mouth daily. 90 capsule 1   fluticasone (FLONASE) 50 MCG/ACT nasal spray PLACE 2 SPRAYS INTO BOTH NOSTRILS DAILY 16 g 6   furosemide (LASIX) 20 MG tablet Take 1 tablet (20 mg total) by mouth daily. 90 tablet 3   Multiple Vitamins-Minerals (PRESERVISION/LUTEIN PO) Take by mouth.       NIFEdipine (PROCARDIA XL/NIFEDICAL XL) 60 MG 24 hr tablet Take 1 tablet (60 mg total) by mouth daily. 90 tablet 1   omeprazole (PRILOSEC) 20 MG capsule TAKE 1 TABLET DAILY 90 capsule 1   PROLIA 60 MG/ML SOSY injection Inject 60 mg into the skin every 6 (six) months. 180 mL 0   RESTASIS 0.05 % ophthalmic emulsion 1 drop 2 (two) times daily.       sildenafil (REVATIO) 20 MG tablet Take 1 tablet (20 mg total) by mouth 2 (two) times daily. 180 tablet 1   SYNTHROID 75 MCG tablet TAKE  (1) TABLET DAILY BE- FORE BREAKFAST. 90 tablet 1    No current facility-administered medications for this visit.                 Family History  Problem Relation Age of Onset   Pancreatic cancer Father     Raynaud syndrome Father     Cancer Father          pancreatic   Rashes / Skin problems Daughter          possibly scleraderma   Hip fracture Mother     Mental illness Mother          attempted suicide at 80 yo        Physical Exam: There were no vitals taken for this visit. EXAM  LUNGS: overall clear  CARD: rr with a harsh 3/6 sem   EXT: no edema       Diagnostic Studies & Laboratory data: I have personally reviewed the following studies and agree with the findings    TTE (03/2022)  IMPRESSIONS    1. The aortic valve is tricuspid. There is severe calcifcation of the  aortic valve. There is severe thickening of the aortic valve. Aortic valve  regurgitation  is trivial. Likely severe aortic valve stenosis with aortic  valve mean gradient of 48.5 mmHg  and Vmax 4.38 m/s. The patient is also hyperdynamic which contributes to  the elevated gradients. The aortic valve area and DI calculate in the  moderate range with AVA 1.1cm2, DI 0.35 (LVOT VTI 35). Visually, however,  the valve appears severely stenosed.   2. Left ventricular ejection fraction, by estimation, is >75%. The left  ventricle has hyperdynamic function. The left ventricle has no regional  wall motion abnormalities. There is moderate concentric left ventricular  hypertrophy. Left ventricular  diastolic parameters are consistent with Grade II diastolic dysfunction  (pseudonormalization).   3. Right ventricular systolic function is normal. The right ventricular  size is normal. There is mildly elevated pulmonary artery systolic  pressure.   4. Left atrial size was severely dilated.   5. Right atrial size was mildly dilated.   6. The mitral valve is degenerative. Trivial mitral valve regurgitation.  Mild to  moderate mitral stenosis. Severe mitral annular calcification. MVA  by PHT is 1.9cm2 and by continuity is 1.75cm2. Mean gradient is 16mHg at  HR 70bpm.   7. The inferior vena cava is normal in size with greater than 50%  respiratory variability, suggesting right atrial pressure of 3 mmHg.   Comparison(s): Compared to prior TTE report in 2019, the AS has progressed  from mild to severe and there is now mild-to-moderate calcific MS.   FINDINGS   Left Ventricle: Left ventricular ejection fraction, by estimation, is  >75%. The left ventricle has hyperdynamic function. The left ventricle has  no regional wall motion abnormalities. The left ventricular internal  cavity size was normal in size. There  is moderate concentric left ventricular hypertrophy. Left ventricular  diastolic parameters are consistent with Grade II diastolic dysfunction  (pseudonormalization).   Right Ventricle: The right ventricular size is normal. No increase in  right ventricular wall thickness. Right ventricular systolic function is  normal. There is mildly elevated pulmonary artery systolic pressure. The  tricuspid regurgitant velocity is 2.97   m/s, and with an assumed right atrial pressure of 3 mmHg, the estimated  right ventricular systolic pressure is 363.0mmHg.   Left Atrium: Left atrial size was severely dilated.   Right Atrium: Right atrial size was mildly dilated.   Pericardium: There is no evidence of pericardial effusion.   Mitral Valve: The mitral valve is degenerative in appearance. Severe  mitral annular calcification. Trivial mitral valve regurgitation. Mild to  moderate mitral valve stenosis. MV peak gradient, 14.7 mmHg. The mean  mitral valve gradient is 6.0 mmHg. MVA by   PHT 1.9cm2 and 1.75cm2 by continuity.     Tricuspid Valve: The tricuspid valve is normal in structure. Tricuspid  valve regurgitation is trivial.   Aortic Valve: The aortic valve is tricuspid. There is severe calcifcation   of the aortic valve. There is severe thickening of the aortic valve.  Aortic valve regurgitation is trivial. Severe aortic stenosis is present.  Aortic valve mean gradient measures  48.5 mmHg. Aortic valve peak gradient measures 76.9 mmHg. Aortic valve  area, by VTI measures 1.12 cm.   Pulmonic Valve: The pulmonic valve was normal in structure. Pulmonic valve  regurgitation is trivial.   Aorta: The aortic root is normal in size and structure.   Venous: The inferior vena cava is normal in size with greater than 50%  respiratory variability, suggesting right atrial pressure of 3 mmHg.   IAS/Shunts: The atrial septum is grossly  normal.      LEFT VENTRICLE  PLAX 2D  LVIDd:         4.00 cm   Diastology  LVIDs:         1.90 cm   LV e' medial:    3.70 cm/s  LV PW:         1.20 cm   LV E/e' medial:  44.9  LV IVS:        1.20 cm   LV e' lateral:   5.22 cm/s  LVOT diam:     2.00 cm   LV E/e' lateral: 31.8  LV SV:         111  LV SV Index:   78  LVOT Area:     3.14 cm      RIGHT VENTRICLE  RV S prime:     15.20 cm/s  TAPSE (M-mode): 3.0 cm   LEFT ATRIUM             Index        RIGHT ATRIUM           Index  LA diam:        4.70 cm 3.31 cm/m   RA Area:     18.10 cm  LA Vol (A2C):   81.7 ml 57.62 ml/m  RA Volume:   44.80 ml  31.60 ml/m  LA Vol (A4C):   93.2 ml 65.73 ml/m  LA Biplane Vol: 88.4 ml 62.35 ml/m   AORTIC VALVE  AV Area (Vmax):    0.99 cm  AV Area (Vmean):   1.01 cm  AV Area (VTI):     1.12 cm  AV Vmax:           438.50 cm/s  AV Vmean:          329.500 cm/s  AV VTI:            0.992 m  AV Peak Grad:      76.9 mmHg  AV Mean Grad:      48.5 mmHg  LVOT Vmax:         138.00 cm/s  LVOT Vmean:        106.000 cm/s  LVOT VTI:          0.353 m  LVOT/AV VTI ratio: 0.36     AORTA  Ao Root diam: 2.80 cm   MITRAL VALVE                TRICUSPID VALVE  MV Area (PHT): 1.96 cm     TR Peak grad:   35.3 mmHg  MV Peak grad:  14.7 mmHg    TR Vmax:        297.00 cm/s   MV Mean grad:  6.0 mmHg  MV Vmax:       1.92 m/s     SHUNTS  MV Vmean:      105.0 cm/s   Systemic VTI:  0.35 m  MV Decel Time: 387 msec     Systemic Diam: 2.00 cm  MV E velocity: 166.00 cm/s  MV A velocity: 158.00 cm/s  MV E/A ratio:  1.05   CATH (03/2022) Conclusion   1.  Minimal obstructive coronary artery disease. 2.  Cardiac output of 8.1 L/min and cardiac index of 5.7 L/min/m with mean RA pressure 14 mmHg, mean wedge pressure of 27 mmHg, and PVR of 1.92 [place] units.       Recent Radiology Findings:    CTA (04/2022) FINDINGS: Aortic Valve:  Tri leaflet AV calcified with score 2592   Aorta: No aneurysm moderate calcific atherosclerosis normal arch vessels   Sinotubular Junction: 22 mm   Ascending Thoracic Aorta: 29 mm   Aortic Arch: 28 mm   Descending Thoracic Aorta: 22 mm   Sinus of Valsalva Measurements:   Non-coronary: 24.3 mm   Right - coronary: 23.6 mm Height 19.0 mm   Left - coronary: 24.9 mmHeight 19.5 mm   Coronary Artery Height above Annulus:   Left Main: 13.4 mm above annulus   Right Coronary: 14.25 mm above annulus   Virtual Basal Annulus Measurements:   Maximum/Minimum Diameter: 19.4 mm x 20.5 mm   Perimeter: 68.14 mm   Area: 360.1 mm 2   Coronary Arteries: Sufficient height above annulus for deployment   Optimum Fluoroscopic Angle for Delivery: LAO 10 Caudal 17 degrees   IMPRESSION: 1. Tri leaflet AV with calcium score 2592.   2. Annular area 360 mm2 suitable for a 23 mm Sapien 3 valve Sinuses are small for a 26 mm Medtronic   3. Optimum angiographic angle for deployment LAO 10 Caudal 17 degrees   4.  Coronary arteries sufficient height above annulus for deployment   5.  Membranous septal length 10.3 mm       Recent Lab Findings: Recent Labs           Lab Results  Component Value Date    WBC 5.1 04/17/2022    HGB 9.3 (L) 04/17/2022    HCT 30.0 (L) 04/17/2022    PLT 271 04/17/2022    GLUCOSE 96 03/27/2022    CHOL 146  02/13/2022    TRIG 144 02/13/2022    HDL 50 02/13/2022    LDLCALC 71 02/13/2022    ALT 9 02/13/2022    AST 15 02/13/2022    NA 143 03/28/2022    K 3.9 03/28/2022    CL 108 (H) 03/27/2022    CREATININE 0.85 03/27/2022    BUN 11 03/27/2022    CO2 19 (L) 03/27/2022    TSH 1.460 02/13/2022    INR 1.1 03/13/2020            Assessment / Plan:     Severe AS  Pt with NYHA class III symptoms due to her severe AS and has a class I indication for AVR. With her age, comorbidities I feel she is best suited for TAVR. We spent time discussing the issues and risks and benefits and she and her family agree and wish to proceed. I feel if complications arise requiring surgical rescue she would be a candidate. She would benefit from a 23 mm Sapien 3 valve via the left TF approach. We did discuss the opportunity for her to have her GI blood loss potentially improved by removing her AS and she is hopeful for  that result also I have spent over 60 min in review of the records, visualizing the studies and in face to face with the patient and in coordinating her future care              Electronically signed by Coralie Common, MD at 05/12/2022  1:15 PM

## 2022-05-19 NOTE — Anesthesia Preprocedure Evaluation (Signed)
Anesthesia Evaluation  Patient identified by MRN, date of birth, ID band Patient awake    Reviewed: Allergy & Precautions, H&P , NPO status , Patient's Chart, lab work & pertinent test results  Airway Mallampati: II  TM Distance: >3 FB Neck ROM: Full    Dental no notable dental hx. (+) Edentulous Upper, Edentulous Lower, Dental Advisory Given   Pulmonary neg pulmonary ROS   Pulmonary exam normal breath sounds clear to auscultation       Cardiovascular Exercise Tolerance: Good hypertension, Pt. on medications + Valvular Problems/Murmurs AS  Rhythm:Regular Rate:Normal + Systolic murmurs    Neuro/Psych   Anxiety Depression    negative neurological ROS     GI/Hepatic Neg liver ROS,GERD  Medicated,,  Endo/Other  Hypothyroidism    Renal/GU negative Renal ROS  negative genitourinary   Musculoskeletal  (+) Arthritis ,    Abdominal   Peds  Hematology  (+) Blood dyscrasia, anemia   Anesthesia Other Findings   Reproductive/Obstetrics negative OB ROS                             Anesthesia Physical Anesthesia Plan  ASA: 4  Anesthesia Plan: MAC   Post-op Pain Management: Tylenol PO (pre-op)*   Induction: Intravenous  PONV Risk Score and Plan: 3 and Ondansetron, Propofol infusion and Treatment may vary due to age or medical condition  Airway Management Planned: Natural Airway and Simple Face Mask  Additional Equipment: Arterial line  Intra-op Plan:   Post-operative Plan:   Informed Consent: I have reviewed the patients History and Physical, chart, labs and discussed the procedure including the risks, benefits and alternatives for the proposed anesthesia with the patient or authorized representative who has indicated his/her understanding and acceptance.     Dental advisory given  Plan Discussed with: CRNA  Anesthesia Plan Comments:        Anesthesia Quick Evaluation

## 2022-05-19 NOTE — Progress Notes (Addendum)
Progress Note  Patient Name: Haley Trevino Date of Encounter: 05/19/2022  Primary Cardiologist: [name], MD  Subjective   No acute events overnight.  Patient feels much improved today.    Inpatient Medications    Scheduled Meds:  acetaminophen  1,000 mg Oral QHS   cycloSPORINE  1 drop Both Eyes BID   ezetimibe  10 mg Oral Daily   FLUoxetine  40 mg Oral Daily   furosemide  40 mg Oral Daily   heparin  5,000 Units Subcutaneous Q8H   levothyroxine  75 mcg Oral Q0600   NIFEdipine  60 mg Oral Daily   pantoprazole  40 mg Oral Daily   sildenafil  20 mg Oral BID   sodium chloride flush  3 mL Intravenous Q12H   Continuous Infusions:  sodium chloride     PRN Meds: sodium chloride, acetaminophen, clonazePAM, ondansetron (ZOFRAN) IV, sodium chloride flush   Vital Signs    Vitals:   05/19/22 0100 05/19/22 0130 05/19/22 0404 05/19/22 0841  BP:   (!) 152/59 (!) 138/93  Pulse: 67 68    Resp: _0 Temp:   97.9 F (36.6 C) 98.1 F (36.7 C)  TempSrc:   Oral Oral  SpO2: 96% 96%    Weight:   43.1 kg   Height:        Intake/Output Summary (Last 24 hours) at 05/19/2022 0910 Last data filed at 05/19/2022 0800 Gross per 24 hour  Intake 240 ml  Output 1200 ml  Net -960 ml   Filed Weights   05/15/22 0500 05/16/22 0604 05/19/22 0404  Weight: 45.3 kg 44 kg 43.1 kg    Telemetry     Personally reviewed, heart rates controlled and in normal sinus rhythm  ECG    An ECG dated 05/12/22 was personally reviewed today and demonstrated:  Normal sinus rhythm  Physical Exam   GEN: No acute distress.   Neck: No JVD, dry MM Cardiac: RRR, no murmur, rub, or gallop.  Respiratory: Nonlabored. Clear to auscultation bilaterally. GI: Soft, nontender, bowel sounds present. MS: No edema or deformity. Neuro:  Nonfocal. Psych: Alert and oriented x 3. Normal affect.  Labs    Chemistry Recent Labs  Lab 05/16/22 1003 05/17/22 0012 05/18/22 0015  NA 138 140 137  K  5.3* 4.2 5.4*  CL 108 104 100  CO2 _1 GLUCOSE 104* 101* 108*  BUN _2 CREATININE 0.81 0.87 1.09*  CALCIUM 8.4* 8.6* 8.9  GFRNONAA >60 >60 51*  ANIONGAP _3 Hematology Recent Labs  Lab 05/15/22 0023 05/16/22 1003 05/18/22 0015  WBC 6.8 10.4 6.4  RBC 3.17* 3.51* 3.87  HGB 9.4* 10.4* 11.2*  HCT 29.6* 33.6* 37.2  MCV 93.4 95.7 96.1  MCH 29.7 29.6 28.9  MCHC 31.8 31.0 30.1  RDW 18.6* 17.5* 17.1*  PLT 251 283 325    Cardiac EnzymesNo results for input(s): "TROPONINIHS" in the last 720 hours.  BNPNo results for input(s): "BNP", "PROBNP" in the last 168 hours.   DDimerNo results for input(s): "DDIMER" in the last 168 hours.   Radiology    CXR 05/17/22:  Mild pulm edema  Cardiac Studies   Echo in November 2023 LVEF 70-75%.  Indeterminate diastology. Moderate MS from MAC Severe aortic valve stenosis   Assessment & Plan    Patient is an 81 year old F known to have scleroderma and [name] on chronic revatio therapy, chronic diastolic heart failure,  moderate mitral valve stenosis due to MAC, severe aortic valve stenosis who presented to [facility] for elective TAVR on 05/13/2022 however was found to be hypoxic on anesthesia induction and case was aborted.  She was admitted for acute hypoxic respiratory failure and currently on IV Lasix for diuresis.  She is posted for TAVR on next week Tuesday.  #Acute hypoxic respiratory failure likely secondary to acute on chronic diastolic heart failure #Acute on chronic diastolic heart failure exacerbation Patient looks euvolemic on exam today and feels improved.  Continue lasix 40 PO Qday.  02 sats normal on 1L [place]. Will remove 02 and see how she does on RA. Check labs today.  TAVR in AM. She had NYHA class IV symptoms that now have dramatically improved.   #Severe aortic valve stenosis TAVR in AM.  #Moderate mitral valve stenosis from MAC Monitor for now.  #Chronic iron deficiency anemia (Q3 week iron  infusions) S/p transfusion last week, Hgb 11 yesterday.  #Raynauds Continue Revatio     Signed, Early Osmond, MD  05/19/2022, 9:10 AM

## 2022-05-19 NOTE — Progress Notes (Signed)
Mobility Specialist Progress Note    05/19/22 1225  Mobility  Activity Ambulated independently in hallway  Level of Assistance Standby assist, set-up cues, supervision of patient - no hands on  Assistive Device None  Distance Ambulated (ft) 420 ft  Activity Response Tolerated well  Mobility Referral Yes  $Mobility charge 1 Mobility   Pre-Mobility: 78 HR, 109/56 (72) BP, 93% SpO2 During Mobility: 114 HR Post-Mobility: 83 HR, 91% SpO2  Pt received in bed and agreeable. No complaints on walk. On RA. Desaturated into mid 80s but able to recover to >/=88% with standing rest break and pursed lip breathing. Returned to bed with call bell in reach. RN notified.     Hildred Alamin Mobility Specialist  Please Psychologist, sport and exercise or Rehab Office at [phone]

## 2022-05-20 ENCOUNTER — Other Ambulatory Visit: Payer: Self-pay | Admitting: Physician Assistant

## 2022-05-20 ENCOUNTER — Inpatient Hospital Stay (HOSPITAL_COMMUNITY): Payer: Medicare Other

## 2022-05-20 ENCOUNTER — Encounter (HOSPITAL_COMMUNITY)
Admission: RE | Disposition: A | Discharge status: Home / Self Care | Payer: Medicare Other | Source: Home / Self Care | Attending: Internal Medicine

## 2022-05-20 ENCOUNTER — Encounter (HOSPITAL_COMMUNITY): Payer: Self-pay | Admitting: Cardiovascular Disease

## 2022-05-20 ENCOUNTER — Inpatient Hospital Stay (HOSPITAL_COMMUNITY): Payer: Medicare Other | Admitting: Anesthesiology

## 2022-05-20 ENCOUNTER — Other Ambulatory Visit: Payer: Self-pay

## 2022-05-20 DIAGNOSIS — M1990 Unspecified osteoarthritis, unspecified site: Secondary | ICD-10-CM

## 2022-05-20 DIAGNOSIS — Z952 Presence of prosthetic heart valve: Secondary | ICD-10-CM

## 2022-05-20 DIAGNOSIS — I350 Nonrheumatic aortic (valve) stenosis: Secondary | ICD-10-CM

## 2022-05-20 DIAGNOSIS — I10 Essential (primary) hypertension: Secondary | ICD-10-CM

## 2022-05-20 DIAGNOSIS — F418 Other specified anxiety disorders: Secondary | ICD-10-CM | POA: Diagnosis not present

## 2022-05-20 DIAGNOSIS — I5033 Acute on chronic diastolic (congestive) heart failure: Secondary | ICD-10-CM | POA: Diagnosis not present

## 2022-05-20 DIAGNOSIS — Z006 Encounter for examination for normal comparison and control in clinical research program: Secondary | ICD-10-CM

## 2022-05-20 HISTORY — DX: Presence of prosthetic heart valve: Z95.2

## 2022-05-20 HISTORY — PX: INTRAOPERATIVE TRANSTHORACIC ECHOCARDIOGRAM: SHX6523

## 2022-05-20 HISTORY — PX: TRANSCATHETER AORTIC VALVE REPLACEMENT, TRANSFEMORAL: SHX6400

## 2022-05-20 LAB — CBC
HCT: 33.4 % — ABNORMAL LOW (ref 36.0–46.0)
Hemoglobin: 10.6 g/dL — ABNORMAL LOW (ref 12.0–15.0)
MCH: 29.5 pg (ref 26.0–34.0)
MCHC: 31.7 g/dL (ref 30.0–36.0)
MCV: 93 fL (ref 80.0–100.0)
Platelets: 266 10*3/uL (ref 150–400)
RBC: 3.59 MIL/uL — ABNORMAL LOW (ref 3.87–5.11)
RDW: 16.1 % — ABNORMAL HIGH (ref 11.5–15.5)
WBC: 6.1 10*3/uL (ref 4.0–10.5)
nRBC: 0 % (ref 0.0–0.2)

## 2022-05-20 LAB — ECHOCARDIOGRAM LIMITED
AR max vel: 0.87 cm2
AV Area VTI: 0.89 cm2
AV Area mean vel: 0.87 cm2
AV Mean grad: 41 mmHg
AV Peak grad: 65.3 mmHg
Ao pk vel: 4.04 m/s
Height: 62 in
MV VTI: 1.54 cm2
Weight: 1523.82 oz

## 2022-05-20 LAB — POCT I-STAT 7, (LYTES, BLD GAS, ICA,H+H)
Acid-Base Excess: 1 mmol/L (ref 0.0–2.0)
Acid-base deficit: 3 mmol/L — ABNORMAL HIGH (ref 0.0–2.0)
Bicarbonate: 25.4 mmol/L (ref 20.0–28.0)
Bicarbonate: 21.7 mmol/L (ref 20.0–28.0)
Calcium, Ion: 1.27 mmol/L (ref 1.15–1.40)
Calcium, Ion: 1.21 mmol/L (ref 1.15–1.40)
HCT: 28 % — ABNORMAL LOW (ref 36.0–46.0)
HCT: 30 % — ABNORMAL LOW (ref 36.0–46.0)
Hemoglobin: 9.5 g/dL — ABNORMAL LOW (ref 12.0–15.0)
Hemoglobin: 10.2 g/dL — ABNORMAL LOW (ref 12.0–15.0)
O2 Saturation: 93 %
O2 Saturation: 87 %
Potassium: 4.1 mmol/L (ref 3.5–5.1)
Potassium: 3.4 mmol/L — ABNORMAL LOW (ref 3.5–5.1)
Sodium: 136 mmol/L (ref 135–145)
Sodium: 136 mmol/L (ref 135–145)
TCO2: 27 mmol/L (ref 22–32)
TCO2: 23 mmol/L (ref 22–32)
pCO2 arterial: 36.9 mmHg (ref 32–48)
pCO2 arterial: 35.8 mmHg (ref 32–48)
pH, Arterial: 7.445 (ref 7.35–7.45)
pH, Arterial: 7.391 (ref 7.35–7.45)
pO2, Arterial: 63 mmHg — ABNORMAL LOW (ref 83–108)
pO2, Arterial: 53 mmHg — ABNORMAL LOW (ref 83–108)

## 2022-05-20 LAB — POCT I-STAT, CHEM 8
BUN: 17 mg/dL (ref 8–23)
Calcium, Ion: 1.23 mmol/L (ref 1.15–1.40)
Chloride: 102 mmol/L (ref 98–111)
Creatinine, Ser: 0.7 mg/dL (ref 0.44–1.00)
Glucose, Bld: 121 mg/dL — ABNORMAL HIGH (ref 70–99)
HCT: 28 % — ABNORMAL LOW (ref 36.0–46.0)
Hemoglobin: 9.5 g/dL — ABNORMAL LOW (ref 12.0–15.0)
Potassium: 4.1 mmol/L (ref 3.5–5.1)
Sodium: 137 mmol/L (ref 135–145)
TCO2: 24 mmol/L (ref 22–32)

## 2022-05-20 LAB — BASIC METABOLIC PANEL
Anion gap: 9 (ref 5–15)
BUN: 24 mg/dL — ABNORMAL HIGH (ref 8–23)
CO2: 25 mmol/L (ref 22–32)
Calcium: 8.7 mg/dL — ABNORMAL LOW (ref 8.9–10.3)
Chloride: 100 mmol/L (ref 98–111)
Creatinine, Ser: 0.96 mg/dL (ref 0.44–1.00)
GFR, Estimated: 60 mL/min — ABNORMAL LOW (ref >60)
Glucose, Bld: 103 mg/dL — ABNORMAL HIGH (ref 70–99)
Potassium: 4.4 mmol/L (ref 3.5–5.1)
Sodium: 134 mmol/L — ABNORMAL LOW (ref 135–145)

## 2022-05-20 LAB — PREPARE RBC (CROSSMATCH)

## 2022-05-20 SURGERY — IMPLANTATION, AORTIC VALVE, TRANSCATHETER, FEMORAL APPROACH
Anesthesia: Monitor Anesthesia Care | Site: Chest

## 2022-05-20 MED ORDER — OXYCODONE HCL 5 MG PO TABS
5.0000 mg | ORAL_TABLET | ORAL | Status: DC | PRN
  Administered 2022-05-20: 5 mg via ORAL
  Administered 2022-05-20 – 2022-05-21 (×2): 10 mg via ORAL
  Administered 2022-05-22 (×3): 5 mg via ORAL
  Administered 2022-05-27 – 2022-05-29 (×5): 10 mg via ORAL
  Filled 2022-05-20: qty 1
  Filled 2022-05-20 (×2): qty 2
  Filled 2022-05-20: qty 1
  Filled 2022-05-20: qty 2
  Filled 2022-05-20: qty 1
  Filled 2022-05-20 (×4): qty 2

## 2022-05-20 MED ORDER — NITROGLYCERIN IN D5W 200-5 MCG/ML-% IV SOLN
0.0000 ug/min | INTRAVENOUS | Status: DC
  Administered 2022-05-20: 5 ug/min via INTRAVENOUS
  Filled 2022-05-20: qty 250

## 2022-05-20 MED ORDER — MORPHINE SULFATE (PF) 2 MG/ML IV SOLN
1.0000 mg | INTRAVENOUS | Status: DC | PRN
  Administered 2022-05-20 – 2022-05-27 (×5): 2 mg via INTRAVENOUS
  Filled 2022-05-20 (×5): qty 1

## 2022-05-20 MED ORDER — ACETAMINOPHEN 650 MG RE SUPP: 650.0000 mg | Freq: Four times a day (QID) | RECTAL | Status: DC | PRN

## 2022-05-20 MED ORDER — HEPARIN 6000 UNIT IRRIGATION SOLUTION
Status: DC | PRN
  Administered 2022-05-20 (×3): 1

## 2022-05-20 MED ORDER — SODIUM CHLORIDE 0.9 % IV SOLN
250.0000 mL | INTRAVENOUS | Status: DC | PRN
  Administered 2022-05-21 – 2022-05-22 (×2): 250 mL via INTRAVENOUS

## 2022-05-20 MED ORDER — LIDOCAINE HCL 1 % IJ SOLN
INTRAMUSCULAR | Status: DC | PRN
  Administered 2022-05-20: 20 mL via INTRADERMAL

## 2022-05-20 MED ORDER — CLEVIDIPINE BUTYRATE 0.5 MG/ML IV EMUL
0.0000 mg/h | INTRAVENOUS | Status: DC
  Administered 2022-05-20: 8 mg/h via INTRAVENOUS
  Administered 2022-05-20: 1 mg/h via INTRAVENOUS
  Administered 2022-05-21: 2 mg/h via INTRAVENOUS
  Filled 2022-05-20 (×2): qty 100
  Filled 2022-05-20: qty 50

## 2022-05-20 MED ORDER — CEFAZOLIN SODIUM-DEXTROSE 2-4 GM/100ML-% IV SOLN: 2.0000 g | Freq: Three times a day (TID) | INTRAVENOUS | Status: DC

## 2022-05-20 MED ORDER — SODIUM CHLORIDE 0.9% FLUSH: 3.0000 mL | INTRAVENOUS | Status: DC | PRN

## 2022-05-20 MED ORDER — ORAL CARE MOUTH RINSE: 15.0000 mL | Freq: Once | OROMUCOSAL | Status: AC

## 2022-05-20 MED ORDER — DEXMEDETOMIDINE HCL IN NACL 80 MCG/20ML IV SOLN
INTRAVENOUS | Status: DC | PRN
  Administered 2022-05-20: 21 ug via BUCCAL

## 2022-05-20 MED ORDER — IODIXANOL 320 MG/ML IV SOLN
INTRAVENOUS | Status: DC | PRN
  Administered 2022-05-20: 150 mL

## 2022-05-20 MED ORDER — AMISULPRIDE (ANTIEMETIC) 5 MG/2ML IV SOLN
INTRAVENOUS | Status: AC
  Filled 2022-05-20: qty 4

## 2022-05-20 MED ORDER — PROCHLORPERAZINE EDISYLATE 10 MG/2ML IJ SOLN
5.0000 mg | Freq: Once | INTRAMUSCULAR | Status: AC
  Administered 2022-05-20: 5 mg via INTRAVENOUS
  Filled 2022-05-20: qty 1

## 2022-05-20 MED ORDER — ONDANSETRON HCL 4 MG/2ML IJ SOLN
4.0000 mg | Freq: Four times a day (QID) | INTRAMUSCULAR | Status: DC | PRN
  Administered 2022-05-20: 4 mg via INTRAVENOUS
  Filled 2022-05-20: qty 2

## 2022-05-20 MED ORDER — SODIUM CHLORIDE 0.9% FLUSH
3.0000 mL | Freq: Two times a day (BID) | INTRAVENOUS | Status: DC
  Administered 2022-05-20 – 2022-05-28 (×15): 3 mL via INTRAVENOUS

## 2022-05-20 MED ORDER — HEPARIN SODIUM (PORCINE) 1000 UNIT/ML IJ SOLN
INTRAMUSCULAR | Status: DC | PRN
  Administered 2022-05-20: 3000 [IU] via INTRAVENOUS
  Administered 2022-05-20: 7000 [IU] via INTRAVENOUS
  Administered 2022-05-20: 2000 [IU] via INTRAVENOUS

## 2022-05-20 MED ORDER — LACTATED RINGERS IV SOLN: INTRAVENOUS | Status: DC | PRN

## 2022-05-20 MED ORDER — CHLORHEXIDINE GLUCONATE 0.12 % MT SOLN
OROMUCOSAL | Status: AC
  Administered 2022-05-20: 15 mL via OROMUCOSAL
  Filled 2022-05-20: qty 15

## 2022-05-20 MED ORDER — PROTAMINE SULFATE 10 MG/ML IV SOLN
INTRAVENOUS | Status: DC | PRN
  Administered 2022-05-20: 100 mg via INTRAVENOUS

## 2022-05-20 MED ORDER — HEPARIN 6000 UNIT IRRIGATION SOLUTION
Status: AC
  Filled 2022-05-20: qty 1500

## 2022-05-20 MED ORDER — PIPERACILLIN-TAZOBACTAM 3.375 G IVPB 30 MIN
3.3750 g | Freq: Once | INTRAVENOUS | Status: AC
  Administered 2022-05-20: 3.375 g via INTRAVENOUS
  Filled 2022-05-20 (×2): qty 50

## 2022-05-20 MED ORDER — PIPERACILLIN-TAZOBACTAM 3.375 G IVPB
3.3750 g | Freq: Three times a day (TID) | INTRAVENOUS | Status: AC
  Administered 2022-05-20 – 2022-05-25 (×15): 3.375 g via INTRAVENOUS
  Filled 2022-05-20 (×15): qty 50

## 2022-05-20 MED ORDER — ACETAMINOPHEN 500 MG PO TABS
1000.0000 mg | ORAL_TABLET | Freq: Once | ORAL | Status: AC
  Administered 2022-05-20: 1000 mg via ORAL

## 2022-05-20 MED ORDER — CLEVIDIPINE BUTYRATE 0.5 MG/ML IV EMUL
INTRAVENOUS | Status: DC | PRN
  Administered 2022-05-20: 4 mg/h via INTRAVENOUS

## 2022-05-20 MED ORDER — LACTATED RINGERS IV SOLN: INTRAVENOUS | Status: DC

## 2022-05-20 MED ORDER — CHLORHEXIDINE GLUCONATE 0.12 % MT SOLN: 15.0000 mL | Freq: Once | OROMUCOSAL | Status: AC

## 2022-05-20 MED ORDER — PROPOFOL 500 MG/50ML IV EMUL
INTRAVENOUS | Status: DC | PRN
  Administered 2022-05-20: 15 ug/kg/min via INTRAVENOUS

## 2022-05-20 MED ORDER — ACETAMINOPHEN 500 MG PO TABS
ORAL_TABLET | ORAL | Status: AC
  Filled 2022-05-20: qty 2

## 2022-05-20 MED ORDER — LIDOCAINE HCL 1 % IJ SOLN
INTRAMUSCULAR | Status: AC
  Filled 2022-05-20: qty 20

## 2022-05-20 MED ORDER — ASPIRIN 81 MG PO CHEW
81.0000 mg | CHEWABLE_TABLET | Freq: Every day | ORAL | Status: DC
  Administered 2022-05-21 – 2022-05-29 (×9): 81 mg via ORAL
  Filled 2022-05-20 (×9): qty 1

## 2022-05-20 MED ORDER — TRAMADOL HCL 50 MG PO TABS
50.0000 mg | ORAL_TABLET | ORAL | Status: DC | PRN
  Filled 2022-05-20: qty 1
  Filled 2022-05-20: qty 2

## 2022-05-20 MED ORDER — ACETAMINOPHEN 325 MG PO TABS
650.0000 mg | ORAL_TABLET | Freq: Four times a day (QID) | ORAL | Status: DC | PRN
  Administered 2022-05-24 – 2022-05-27 (×2): 650 mg via ORAL
  Filled 2022-05-20 (×2): qty 2

## 2022-05-20 MED ORDER — SODIUM CHLORIDE 0.9 % IV SOLN: INTRAVENOUS | Status: AC

## 2022-05-20 MED ORDER — AMISULPRIDE (ANTIEMETIC) 5 MG/2ML IV SOLN
INTRAVENOUS | Status: DC | PRN
  Administered 2022-05-20: 10 mg via INTRAVENOUS

## 2022-05-20 MED ORDER — 0.9 % SODIUM CHLORIDE (POUR BTL) OPTIME
TOPICAL | Status: DC | PRN
  Administered 2022-05-20: 1000 mL

## 2022-05-20 MED ORDER — CHLORHEXIDINE GLUCONATE CLOTH 2 % EX PADS
6.0000 | MEDICATED_PAD | Freq: Every day | CUTANEOUS | Status: DC
  Administered 2022-05-21 – 2022-05-28 (×9): 6 via TOPICAL

## 2022-05-20 SURGICAL SUPPLY — 80 items
ADH SKN CLS APL DERMABOND .7 (GAUZE/BANDAGES/DRESSINGS) ×2
APL PRP STRL LF DISP 70% ISPRP (MISCELLANEOUS) ×2
BAG COUNTER SPONGE SURGICOUNT (BAG) ×3 IMPLANT
BAG DECANTER FOR FLEXI CONT (MISCELLANEOUS) IMPLANT
BAG SNAP BAND KOVER 36X36 (MISCELLANEOUS) IMPLANT
BAG SPNG CNTER NS LX DISP (BAG) ×2
BIOPATCH BLUE 3/4IN DISK W/1.5 (GAUZE/BANDAGES/DRESSINGS) IMPLANT
BLADE CLIPPER SURG (BLADE) IMPLANT
BLADE STERNUM SYSTEM 6 (BLADE) IMPLANT
CABLE ADAPT CONN TEMP 6FT (ADAPTER) ×3 IMPLANT
CABLE SURGICAL S-101-97-12 (CABLE) IMPLANT
CANISTER SUCT 3000ML PPV (MISCELLANEOUS) IMPLANT
CATH DIAG EXPO 6F AL1 (CATHETERS) IMPLANT
CATH DIAG EXPO 6F VENT PIG 145 (CATHETERS) ×6 IMPLANT
CATH S G BIP PACING (CATHETERS) ×3 IMPLANT
CHLORAPREP W/TINT 26 (MISCELLANEOUS) ×3 IMPLANT
CLOSURE PERCLOSE PROSTYLE (VASCULAR PRODUCTS) IMPLANT
CNTNR URN SCR LID CUP LEK RST (MISCELLANEOUS) ×6 IMPLANT
CONT SPEC 4OZ STRL OR WHT (MISCELLANEOUS) ×4
COVER BACK TABLE 80X110 HD (DRAPES) IMPLANT
COVER PROBE W GEL 5X96 (DRAPES) IMPLANT
DERMABOND ADVANCED .7 DNX12 (GAUZE/BANDAGES/DRESSINGS) ×3 IMPLANT
DEVICE CLOSURE PERCLS PRGLD 6F (VASCULAR PRODUCTS) ×6 IMPLANT
DRAPE BRACHIAL (DRAPES) IMPLANT
DRSG TEGADERM 2-3/8X2-3/4 SM (GAUZE/BANDAGES/DRESSINGS) IMPLANT
DRSG TEGADERM 4X10 (GAUZE/BANDAGES/DRESSINGS) IMPLANT
DRSG TEGADERM 4X4.75 (GAUZE/BANDAGES/DRESSINGS) ×6 IMPLANT
ELECT REM PT RETURN 9FT ADLT (ELECTROSURGICAL) ×2
ELECTRODE REM PT RTRN 9FT ADLT (ELECTROSURGICAL) ×3 IMPLANT
GAUZE 4X4 16PLY ~~LOC~~+RFID DBL (SPONGE) IMPLANT
GAUZE SPONGE 2X2 8PLY STRL LF (GAUZE/BANDAGES/DRESSINGS) IMPLANT
GAUZE SPONGE 4X4 12PLY STRL (GAUZE/BANDAGES/DRESSINGS) ×3 IMPLANT
GLOVE BIO SURGEON STRL SZ7 (GLOVE) IMPLANT
GLOVE BIO SURGEON STRL SZ7.5 (GLOVE) IMPLANT
GLOVE ECLIPSE 7.0 STRL STRAW (GLOVE) ×3 IMPLANT
GOWN STRL REUS W/ TWL LRG LVL3 (GOWN DISPOSABLE) ×3 IMPLANT
GOWN STRL REUS W/ TWL XL LVL3 (GOWN DISPOSABLE) IMPLANT
GOWN STRL REUS W/TWL LRG LVL3 (GOWN DISPOSABLE) ×2
GOWN STRL REUS W/TWL XL LVL3 (GOWN DISPOSABLE)
GUIDEWIRE SAF TJ AMPL .035X180 (WIRE) ×3 IMPLANT
GUIDEWIRE SAFE TJ AMPLATZ EXST (WIRE) ×3 IMPLANT
KIT BASIN OR (CUSTOM PROCEDURE TRAY) ×3 IMPLANT
KIT HEART LEFT (KITS) ×3 IMPLANT
KIT TURNOVER KIT B (KITS) ×3 IMPLANT
NS IRRIG 1000ML POUR BTL (IV SOLUTION) ×3 IMPLANT
PACK ENDO MINOR (CUSTOM PROCEDURE TRAY) ×3 IMPLANT
PAD ARMBOARD 7.5X6 YLW CONV (MISCELLANEOUS) ×6 IMPLANT
PAD ELECT DEFIB RADIOL ZOLL (MISCELLANEOUS) ×3 IMPLANT
PERCLOSE PROGLIDE 6F (VASCULAR PRODUCTS) ×4
POSITIONER HEAD DONUT 9IN (MISCELLANEOUS) ×3 IMPLANT
SET MICROPUNCTURE 5F STIFF (MISCELLANEOUS) ×3 IMPLANT
SHEATH BRITE TIP 7FR 35CM (SHEATH) ×3 IMPLANT
SHEATH PINNACLE 6F 10CM (SHEATH) ×3 IMPLANT
SHEATH PINNACLE 8F 10CM (SHEATH) ×3 IMPLANT
SLEEVE REPOSITIONING LENGTH 30 (MISCELLANEOUS) ×3 IMPLANT
SPIKE FLUID TRANSFER (MISCELLANEOUS) ×3 IMPLANT
SPONGE T-LAP 18X18 ~~LOC~~+RFID (SPONGE) ×3 IMPLANT
STOPCOCK MORSE 400PSI 3WAY (MISCELLANEOUS) ×6 IMPLANT
SUT ETHIBOND 5 LR DA (SUTURE) IMPLANT
SUT PROLENE 6 0 C 1 30 (SUTURE) IMPLANT
SUT SILK  1 MH (SUTURE) ×4
SUT SILK 1 MH (SUTURE) ×3 IMPLANT
SYR 50ML LL SCALE MARK (SYRINGE) ×3 IMPLANT
SYR BULB IRRIG 60ML STRL (SYRINGE) IMPLANT
SYR MEDRAD MARK V 150ML (SYRINGE) IMPLANT
TOWEL GREEN STERILE (TOWEL DISPOSABLE) ×6 IMPLANT
TRANSDUCER W/STOPCOCK (MISCELLANEOUS) ×6 IMPLANT
TRAY FOLEY SLVR 14FR TEMP STAT (SET/KITS/TRAYS/PACK) IMPLANT
TUBE SUCT INTRACARD DLP 20F (MISCELLANEOUS) IMPLANT
TUBING ART PRESS 72  MALE/FEM (TUBING) ×2
TUBING ART PRESS 72 MALE/FEM (TUBING) IMPLANT
TUBING CIL FLEX 10 FLL-RA (TUBING) IMPLANT
VALVE AORTIC SZ25 INSP/RESIL (Prosthesis & Implant Heart) IMPLANT
WIRE AMPLATZ SS-J .035X180CM (WIRE) IMPLANT
WIRE EMERALD 3MM-J .035X150CM (WIRE) ×3 IMPLANT
WIRE EMERALD 3MM-J .035X260CM (WIRE) ×3 IMPLANT
WIRE EMERALD ST .035X260CM (WIRE) ×6 IMPLANT
WIRE MICRO SET SILHO 5FR 7 (SHEATH) IMPLANT
WIRE PACING TEMP ST TIP 5 (CATHETERS) IMPLANT
WIRE SAFARI SM CURVE 275 (WIRE) IMPLANT

## 2022-05-20 NOTE — Consult Note (Addendum)
NAME:  Haley Trevino, MRN:  [id number], DOB:  08-Apr-1941, LOS: 7 ADMISSION DATE:  05/13/2022, CONSULTATION DATE:  05/20/2022 REFERRING MD: Alfredo Martinez -[facility], CHIEF COMPLAINT: Hypoxia post TAVR  History of Present Illness:  81 year old woman who underwent TAVR 11/13.  Significant perioperative hypoxia thought to be due to to pulmonary edema.  She had known aortic stenosis and was seen recently by Dr. Lavonna Monarch for worsening of her symptoms which had progressed to an NYHA class III.  She had a calculated aortic valve area of 1.1 cm and her cath did not show significant coronary artery disease.   Intra-op TAVR course c/b on-going nausea and hypoxia. PCCM asked to see post-operatively for hypoxia   Pertinent  Medical History  Scleroderma, Raynauds on Ravatio,  HTN, HL, GERD, IBS, hypothyroidism, chronic diastolic HF, severe AS from MAC.   Significant Hospital Events: Including procedures, antibiotic start and stop dates in addition to other pertinent events   Admitted 11/7 for elective TAVR. Procedure delayed due to respiratory failure and pulm edema 11/14 went for TAVR.  Had sig nausea during procedure w/ increasing oxygen needs. Bradycardia/CBH s/p valve deployment requiring transvenous PM Returned to ICU post TAVR on NRB. PCCM asked to eval   Interim History / Subjective:  Has some chest discomfort. Currently on NRB   Objective   Blood pressure (!) 138/52, pulse 69, temperature 98.5 F (36.9 C), temperature source Oral, resp. rate 16, height _0  (1.575 m), weight 43.2 kg, SpO2 94 %.        Intake/Output Summary (Last 24 hours) at 05/20/2022 1423 Last data filed at 05/20/2022 1400 Gross per 24 hour  Intake 1000 ml  Output 800 ml  Net 200 ml   Filed Weights   05/16/22 0604 05/19/22 0404 05/20/22 0516  Weight: 44 kg 43.1 kg 43.2 kg    Examination: General: frail 81 year old female resting in bed. No distress but in on 15 l NRB HENT: NCAT no JVD  Lungs: basilar  rales. No accessory use  Cardiovascular: RRR v paced at 60 Abdomen: soft not tender  Extremities: warm no sg edema brisk CR. Small old ulcerations on fingers.  Neuro: awake and oriented   Resolved Hospital Problem list    Assessment & Plan:  Acute hypoxic respiratory failure suspect pulmonary edema but also consider aspiration.  -initially admitted w/ volume overload and pulm edema cxr had improved w/ diuresis. Had sig increase in O2 needs intra-op. Did have nausea and vomiting raising concern for aspiration  Plan PCXR now; suspect she will need further diuresis as well Cont to wean oxygen  Cont zosyn day 1 OK to cont revatio. This may actually help w. Her hypoxia   Svr AS now S/p TAVR 11/14; intra-op course c/b Symptomatic bradycardia and HTN -has transcutaneous pacemaker Plan Tele  Holding BB Preload reduction w/ NTG gtt 90-150 goal  Adding cleviprex  Consider IV lasix depending on CXR; may hold for now  Adding back home nifedipine   H/o raynaud's  Plan Cont her revatio for now   H/o hypothyroidism  Plan Cont synthroid    Best Practice (right click and "Reselect all SmartList Selections" daily)   Diet/type: NPO DVT prophylaxis: SCD GI prophylaxis: N/A Lines: N/A Foley:  Yes, and it is still needed Code Status:  full code Last date of multidisciplinary goals of care discussion [per primary ]  Past Medical History:  Diagnosis Date   Cataract    Depression    GERD (gastroesophageal  reflux disease)    Hyperlipidemia    Hypertension    Hypothyroidism    IBS (irritable bowel syndrome)    Osteoarthritis    Raynaud disease    S/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 Ali Lowe & Bartle   Scleroderma [facility])    Thyroid disease    hypothyroidism   Social History   Socioeconomic History   Marital status: Married    Spouse name: Not on file   Number of children: 2   Years of  education: Not on file   Highest education level: Some college, no degree  Occupational History   Occupation: retired Occupational psychologist at [employer]  Tobacco Use   Smoking status: Never   Smokeless tobacco: Never   Tobacco comments:    passive tobacco smoke exposure as child  Vaping Use   Vaping Use: Never used  Substance and Sexual Activity   Alcohol use: No   Drug use: No   Sexual activity: Not Currently    Birth control/protection: Surgical  Other Topics Concern   Not on file  Social History Narrative   Regular exercise: walkingCaffeine use: 1 cup of coffee daily   Lives in split level home with her husband   Social Determinants of Health   Financial Resource Strain: [place]  (04/29/2021)   Overall Financial Resource Strain (CARDIA)    Difficulty of Paying Living Expenses: Not hard at all  Food Insecurity: No Food Insecurity (04/29/2021)   Hunger Vital Sign    Worried About Running Out of Food in the Last Year: Never true    Hamer in the Last Year: Never true  Transportation Needs: No Transportation Needs (04/29/2021)   PRAPARE - Hydrologist (Medical): No    Lack of Transportation (Non-Medical): No  Physical Activity: Inactive (06/14/2021)   Exercise Vital Sign    Days of Exercise per Week: 0 days    Minutes of Exercise per Session: 0 min  Stress: Stress Concern Present (06/14/2021)   [place]    Feeling of Stress : Rather much  Social Connections: Socially Integrated (04/29/2021)   Social Connection and Isolation Panel [NHANES]    Frequency of Communication with Friends and Family: More than three times a week    Frequency of Social Gatherings with Friends and Family: Once a week    Attends Religious Services: 1 to 4 times per year    Active Member of [employer] or Organizations: Yes    Attends Music therapist: 1 to 4 times per year    Marital Status:  Married    Family History  Problem Relation Age of Onset   Pancreatic cancer Father    Raynaud syndrome Father    Cancer Father        pancreatic   Rashes / Skin problems Daughter        possibly scleraderma   Hip fracture Mother    Mental illness Mother        attempted suicide at 34 yo    Allergies  Allergen Reactions   Bactrim [Sulfamethoxazole-Trimethoprim] Nausea And Vomiting   Dilaudid [Hydromorphone Hcl] Anaphylaxis   Dilaudid [Hydromorphone] Anaphylaxis   Acyclovir And Related     Unknown reaction   Crestor [Rosuvastatin Calcium] Other (See Comments)    Muscle pain   Gabapentin Swelling    Swelling  in feet, and in legs   Aleve [Naproxen Sodium] Rash   Lyrica [Pregabalin] Other (See Comments)    Swelling in feet, and in legs   Scheduled Meds:  acetaminophen       acetaminophen  1,000 mg Oral QHS   [START ON 05/21/2022] aspirin  81 mg Oral Daily   cycloSPORINE  1 drop Both Eyes BID   ezetimibe  10 mg Oral Daily   FLUoxetine  40 mg Oral Daily   levothyroxine  75 mcg Oral Q0600   NIFEdipine  60 mg Oral Daily   pantoprazole  40 mg Oral Daily   sildenafil  20 mg Oral BID   sodium chloride flush  3 mL Intravenous Q12H   Continuous Infusions:  sodium chloride 50 mL/hr at 05/20/22 1431   sodium chloride     nitroGLYCERIN 5 mcg/min (05/20/22 1450)   piperacillin-tazobactam 3.375 g (05/20/22 1515)   piperacillin-tazobactam (ZOSYN)  IV     PRN Meds:.sodium chloride, acetaminophen, acetaminophen **OR** acetaminophen, clonazePAM, morphine injection, ondansetron (ZOFRAN) IV, oxyCODONE, sodium chloride flush, traMADol Recent Labs  Lab 05/18/22 0015 05/19/22 1533 05/20/22 0016 05/20/22 1154 05/20/22 1157  NA 137 137 134* 137 136  K 5.4* 4.3 4.4 4.1 4.1  CL 100 101 100 102  --   CO2 _0 --   --   BUN 20 21 24* 17  --   CREATININE 1.09* 0.91 0.96 0.70  --   GLUCOSE 108* 98 103* 121*  --    Recent Labs  Lab 05/18/22 0015 05/19/22 0842 05/20/22 0016  05/20/22 1154 05/20/22 1157  HGB 11.2* 11.2* 10.6* 9.5* 9.5*  HCT 37.2 36.1 33.4* 28.0* 28.0*  WBC 6.4 5.3 6.1  --   --   PLT 325 291 266  --   --     Review of Systems  Constitutional: Negative.   HENT: Negative.    Eyes: Negative.   Respiratory:  Positive for shortness of breath. Negative for sputum production.   Cardiovascular:  Positive for chest pain and leg swelling.  Gastrointestinal:  Positive for nausea.  Genitourinary: Negative.   Skin: Negative.      Total critical care time: 32 min   Erick Colace ACNP-BC [place] Pager # [phone] OR # [phone] if no answer

## 2022-05-20 NOTE — Progress Notes (Signed)
  [place] VALVE TEAM  Patient doing well s/p TAVR. She is hemodynamically stable but hypertensive requiring IV nitro and cleviprex. Groin sites stable. She is being paced at 60 bpm, CHB underneath.  Keep temp wire in at 21. Having some pain in neck that Improved with oxy. PCCM consulted for possible aspiration (had severe n/v during case) and started on IV Zosyn.   Angelena Form PA-C  [facility]  Pager [phone]

## 2022-05-20 NOTE — Op Note (Signed)
[facility]  TAVR OPERATIVE NOTE   Date of Procedure:  05/20/2022  Preoperative Diagnosis: Severe Aortic Stenosis   Postoperative Diagnosis: Same   Procedure:   Transcatheter Aortic Valve Replacement - Transfemoral Approach  Edwards Sapien 3 Resilia THV (size 65m, model # 9755RLS, serial # [id number])   Co-Surgeons:   BGaye Pollack MD and ALenna Sciara MD Anesthesiologist:  WRoderic Palau MD  Echocardiographer:  PJenkins Rouge MD  Pre-operative Echo Findings: Severe aortic stenosis Normal left ventricular systolic function  Post-operative Echo Findings: No paravalvular leak Normal left ventricular systolic function  Left Heart Catheterization Findings: Left ventricular end-diastolic pressure of [id number] Temporary Pacemaker Placement   BRIEF CLINICAL NOTE AND INDICATIONS FOR SURGERY  The patient is a 81year old female with a history of moderate to severe symptomatic aortic stenosis, moderate mitral stenosis, iron deficiency, hyperlipidemia, scleroderma, and Raynaud's who was referred for recommendations regarding her symptomatic aortic stenosis.  She was scheduled for TAVR last week and was not to be in acute diastolic heart failure.  She was admitted, stabilized with intravenous diuretics and referred for TAVR today.  During the course of the patient's preoperative work up they have been evaluated comprehensively by a multidisciplinary team of specialists [facility].  They have been demonstrated to suffer from symptomatic severe aortic stenosis as noted above. The patient has been counseled extensively as to the relative risks and benefits of all options for the treatment of severe aortic stenosis including long term medical therapy, conventional surgery for aortic valve replacement, and transcatheter aortic valve replacement.  The patient has been independently  evaluated by Dr. BCyndia Bentwith CT surgery and they are felt to be at high risk for conventional surgical aortic valve replacement. The surgeon indicated the patient would be a poor candidate for conventional surgery. Based upon review of all of the patient's preoperative diagnostic tests they are felt to be candidate for transcatheter aortic valve replacement using the transfemoral approach as an alternative to high risk conventional surgery.    Following the decision to proceed with transcatheter aortic valve replacement, a discussion has been held regarding what types of management strategies would be attempted intraoperatively in the event of life-threatening complications, including whether or not the patient would be considered a candidate for the use of cardiopulmonary bypass and/or conversion to open sternotomy for attempted surgical intervention.  The patient has been advised of a variety of complications that might develop peculiar to this approach including but not limited to risks of death, stroke, paravalvular leak, aortic dissection or other major vascular complications, aortic annulus rupture, device embolization, cardiac rupture or perforation, acute myocardial infarction, arrhythmia, heart block or bradycardia requiring permanent pacemaker placement, congestive heart failure, respiratory failure, renal failure, pneumonia, infection, other late complications related to structural valve deterioration or migration, or other complications that might ultimately cause a temporary or permanent loss of functional independence or other long term morbidity.  The patient provides full informed consent for the procedure as described and all questions were answered preoperatively.    DETAILS OF THE OPERATIVE PROCEDURE  PREPARATION:   The patient is brought to the operating room on the above mentioned date and central monitoring was established by the anesthesia team including placement of a radial arterial  line. The patient is placed in the supine position on the operating table.  Intravenous antibiotics are administered. Conscious sedation is used.   Baseline  transthoracic echocardiogram was performed. The patient's chest, abdomen, both groins, and both lower extremities are prepared and draped in a sterile manner. A time out procedure is performed.   PERIPHERAL ACCESS:   Using the modified Seldinger technique, femoral arterial and venous access were obtained with placement of a 6 Fr sheath in the artery and a 7 Fr sheath in the vein on the right side using u/s guidance.  A pigtail diagnostic catheter was passed through the femoral arterial sheath under fluoroscopic guidance into the aortic root.  A temporary transvenous pacemaker catheter was passed through the femoral venous sheath under fluoroscopic guidance into the right ventricle.  Aortic root angiography was performed in order to determine the optimal angiographic angle for valve deployment.  TRANSFEMORAL ACCESS:  A micropuncture kit was used to gain access to the left femoral artery using u/s guidance. Position confirmed with angiography. Pre-closure with double ProGlide closure devices. The patient was heparinized systemically and ACT verified > 250 seconds.    A 14 Fr transfemoral E-sheath was introduced into the left femoral artery after progressively dilating over an Amplatz superstiff wire. An AL-1 catheter was used to direct a straight-tip exchange length wire across the native aortic valve into the left ventricle. This was exchanged out for a pigtail catheter and position was confirmed in the LV apex. Simultaneous left ventricular, aortic, and left ventricular end-diastolic pressures were recorded.  The pigtail catheter was then exchanged for an Safari wire in the LV apex.  Direct LV pacing thresholds were assessed and found to be adequate.   TRANSCATHETER HEART VALVE DEPLOYMENT:  An Edwards Sapien 3 THV (size 47m) was prepared and  crimped per manufacturer's guidelines, and the proper orientation of the valve is confirmed on the EAmeren Corporationdelivery system. The valve was advanced through the introducer sheath using normal technique until in an appropriate position in the abdominal aorta beyond the sheath tip. The balloon was then retracted and using the fine-tuning wheel was centered on the valve. The valve was then advanced across the aortic arch using appropriate flexion of the catheter. The valve was carefully positioned across the aortic valve annulus. The Commander catheter was retracted using normal technique. Once final position of the valve has been confirmed by angiographic assessment, the valve is deployed while temporarily holding ventilation and during rapid ventricular pacing to maintain systolic blood pressure < 50 mmHg and pulse pressure < 10 mmHg. The balloon inflation is held for >3 seconds after reaching full deployment volume. Once the balloon has fully deflated the balloon is retracted into the ascending aorta and valve function is assessed using TTE. There is felt to be no paravalvular leak and no central aortic insufficiency.  The patient's hemodynamic recovery following valve deployment is good.  The deployment balloon and guidewire are both removed. Echo demostrated acceptable post-procedural gradients, stable mitral valve function, and no AI.   TEMPORARY PACEMAKER PLACEMENT:  The patient developed complete heart block after valve deployment.  The patient was paced through the LV wire.  With this pacing ongoing, ultrasound was used to gain access to the right internal jugular vein, a 6Fr sheath was secured in place, and the temporary pacemaker was positioned in the apex of the right ventricle.  Thresholds were tested and found to be adequate.  The pacemaker was set to a back up rate of 60bpm (threshold <170m with an output of 67m70m PROCEDURE COMPLETION:  The sheath was then removed and closure devices were  completed. Protamine was administered once  femoral arterial repair was complete. The temporary pacemaker, pigtail catheters and femoral sheaths were removed with a Perclose closure device placed in the artery and manual pressure used for venous hemostasis.    The patient tolerated the procedure well and is transported to the surgical intensive care in stable condition. There were no immediate intraoperative complications. All sponge instrument and needle counts are verified correct at completion of the operation.   No blood products were administered during the operation.  The patient received a total of 120 mL of intravenous contrast during the procedure.  Early Osmond MD 05/20/2022 7:30 PM

## 2022-05-20 NOTE — Interval H&P Note (Signed)
History and Physical Interval Note:  05/20/2022 10:01 AM  Haley Trevino  has presented today for surgery, with the diagnosis of Severe Aortic Stenosis.  The various methods of treatment have been discussed with the patient and family. After consideration of risks, benefits and other options for treatment, the patient has consented to  Procedure(s): Transcatheter Aortic Valve Replacement, Transfemoral (Left) INTRAOPERATIVE TRANSTHORACIC ECHOCARDIOGRAM (N/A) as a surgical intervention.  The patient's history has been reviewed, patient examined, no change in status, stable for surgery.  I have reviewed the patient's chart and labs.  Questions were answered to the patient's satisfaction.     Gaye Pollack

## 2022-05-20 NOTE — Anesthesia Postprocedure Evaluation (Signed)
Anesthesia Post Note  Patient: Haley Trevino  Procedure(s) Performed: Transcatheter Aortic Valve Replacement, Transfemoral using an Edwards SAPIEN 3 Ultra 23 MM Heart Valve (Left: Chest) INTRAOPERATIVE TRANSTHORACIC ECHOCARDIOGRAM     Patient location during evaluation: ICU Anesthesia Type: MAC Level of consciousness: awake and alert Pain management: pain level controlled Vital Signs Assessment: post-procedure vital signs reviewed and stable Respiratory status: spontaneous breathing, nonlabored ventilation, respiratory function stable and patient connected to face mask oxygen Cardiovascular status: stable and blood pressure returned to baseline Postop Assessment: no apparent nausea or vomiting Anesthetic complications: no  No notable events documented.  Last Vitals:  Vitals:   05/20/22 1530 05/20/22 1545  BP:  (!) 120/56  Pulse: 60 60  Resp: 13 17  Temp: 37.3 C   SpO2: 95% 96%    Last Pain:  Vitals:   05/20/22 1530  TempSrc: Oral  PainSc: 0-No pain                 Clydie Dillen,W. EDMOND

## 2022-05-20 NOTE — Op Note (Signed)
[facility]   MULTIDISCIPLINARY HEART VALVE TEAM   TAVR OPERATIVE NOTE   Date of Procedure:  05/20/2022  Preoperative Diagnosis: Severe Aortic Stenosis   Postoperative Diagnosis: Same   Procedure:   Transcatheter Aortic Valve Replacement - Percutaneous Left Transfemoral Approach  Edwards Sapien 3 Ultra Resilia THV (size 23 mm, model # 9755RSL, serial # [id number])   Co-Surgeons:  Gaye Pollack, MD and Lenna Sciara, MD   Anesthesiologist:  Roderic Palau, MD  Echocardiographer:  Jenkins Rouge, MD  Pre-operative Echo Findings: Severe aortic stenosis Normal left ventricular systolic function  Post-operative Echo Findings: No paravalvular leak Normal left ventricular systolic function   BRIEF CLINICAL NOTE AND INDICATIONS FOR SURGERY  Pt is an 81 yo female that has been followed for her aortic stenosis for some time. She has been getting significantly more dyspneic the past year and to the point she cannot perform her daily activities without becoming SOB and needing to rest. She occasionally gets lightheaded and felt that her fall several weeks ago may have been related to getting out of bed too quickly. She has chest discomfort that is not exertional but what she feels is related to her breast scar from removed implants. She has had a very extensive GI work up for blood loss anemia without an etiology and has been on iron infusions the past year for therapy. She only had one blood transfusion. She does feel she is having hemrrhoid issues  currently. Her work up had included an echo (see below) with an aortic valve area of 1.1 cm grade, mean gradient of 48 mmHg, and peak velocity 4.38 m/s; ejection fraction of 65 to 70%; no conduction abnormalities on her EKG. She was felt to be best suited for TAVR and has had her cath (without siginifcant CAD) and TAVR CTA that has acceptable anatomy for TAVR.     DETAILS OF THE OPERATIVE PROCEDURE  PREPARATION:    The  patient was brought to the operating room on the above mentioned date and appropriate monitoring was established by the anesthesia team. The patient was placed in the supine position on the operating table.  Intravenous antibiotics were administered. The patient was monitored closely throughout the procedure under conscious sedation.    Baseline transthoracic echocardiogram was performed. The patient's abdomen and both groins were prepped and draped in a sterile manner. A time out procedure was performed.   PERIPHERAL ACCESS:    Using the modified Seldinger technique, femoral arterial and venous access was obtained with placement of 6 Fr sheaths on the right side.  A pigtail diagnostic catheter was passed through the right arterial sheath under fluoroscopic guidance into the aortic root. Aortic root angiography was performed in order to determine the optimal angiographic angle for valve deployment.   TRANSFEMORAL ACCESS:   Percutaneous transfemoral access and sheath placement was performed using ultrasound guidance.  The left common femoral artery was cannulated using a micropuncture needle and appropriate location was verified using hand injection angiogram.  A pair of Abbott Perclose percutaneous closure devices were placed and a 6 French sheath replaced into the femoral artery.  The patient was heparinized systemically and ACT verified > 250 seconds.    A 14 Fr transfemoral E-sheath was introduced into the left common femoral artery after progressively dilating over an Amplatz superstiff wire. An AL-1 catheter was used to direct a straight-tip exchange length wire across the native aortic valve into the left ventricle. This was exchanged out for a pigtail catheter  and position was confirmed in the LV apex. Simultaneous LV and Ao pressures were recorded.  The pigtail catheter was exchanged for a Safari wire in the LV apex. Direct LV pacing threshold through the [facility] wire was tested and was  satisfactory.  BALLOON AORTIC VALVULOPLASTY:   Not performed   TRANSCATHETER HEART VALVE DEPLOYMENT:   An Edwards Sapien 3 Ultra transcatheter heart valve (size 23 mm) was prepared and crimped per manufacturer's guidelines, and the proper orientation of the valve is confirmed on the [employer] delivery system. The valve was advanced through the introducer sheath using normal technique until in an appropriate position in the abdominal aorta beyond the sheath tip. The balloon was then retracted and using the fine-tuning wheel was centered on the valve. The valve was then advanced across the aortic arch using appropriate flexion of the catheter. The valve was carefully positioned across the aortic valve annulus. The Commander catheter was retracted using normal technique. Once final position of the valve has been confirmed by angiographic assessment, the valve is deployed during rapid ventricular pacing to maintain systolic blood pressure < 50 mmHg and pulse pressure < 10 mmHg. The balloon inflation is held for >3 seconds after reaching full deployment volume. Once the balloon has fully deflated the balloon is retracted into the ascending aorta and valve function is assessed using echocardiography. There is felt to be no paravalvular leak and no central aortic insufficiency.  The patient's hemodynamic recovery following valve deployment is good.  The deployment balloon and guidewire are both removed.   TEMPORARY PACEMAKER PLACEMENT:   The patient developed complete heart block after valve deployment.  The patient was paced through the LV wire.  Right internal jugular venous access was obtained using ultrasound guidance and a 6Fr sheath was inserted. A temporary pacemaker was positioned in the apex of the right ventricle.  Thresholds were tested and found to be adequate. The rate was set to 60 and the patient was pacing 100%.  PROCEDURE COMPLETION:   The sheath was removed and femoral artery  closure performed.  Protamine was administered once femoral arterial repair was complete. The temporary pacemaker, pigtail catheter and femoral sheaths were removed with manual pressure used for venous hemostasis.  A Perclose femoral closure device was utilized following removal of the diagnostic sheath in the right femoral artery.  The patient tolerated the procedure well and is transported to the cath lab recovery area in stable condition. There were no immediate intraoperative complications. All sponge instrument and needle counts are verified correct at completion of the operation.   No blood products were administered during the operation.  The patient received a total of 120 mL of intravenous contrast during the procedure.   Gaye Pollack, MD 05/20/2022

## 2022-05-20 NOTE — Anesthesia Procedure Notes (Signed)
Arterial Line Insertion Start/End11/14/2023 10:20 AM, 05/20/2022 10:25 AM Performed by: CRNA  Patient location: Pre-op. Preanesthetic checklist: patient identified, IV checked, site marked, risks and benefits discussed, surgical consent, monitors and equipment checked, pre-op evaluation, timeout performed and anesthesia consent Lidocaine 1% used for infiltration Right, radial was placed Catheter size: 20 G Hand hygiene performed  and maximum sterile barriers used   Attempts: 1 Procedure performed without using ultrasound guided technique. Following insertion, dressing applied and Biopatch. Post procedure assessment: normal and unchanged  Patient tolerated the procedure well with no immediate complications.

## 2022-05-20 NOTE — Progress Notes (Signed)
[facility] Progress Note Patient Name: KENNA SEWARD DOB: Feb 26, 1941 MRN: [id number]   Date of Service  05/20/2022  HPI/Events of Note  Urinary retention. Nausea unresolved by  Zofran.  eICU Interventions  In / Out urine bladder catheterization, Compazine 5 mg iv x 1.        Kerry Kass Annamay Laymon 05/20/2022, 11:30 PM

## 2022-05-20 NOTE — Progress Notes (Signed)
Pharmacy Antibiotic Note  Haley Trevino is a 81 y.o. female s/p TAVR with possible aspiration PNA.  Pharmacy has been consulted for Zosyn dosing. -WBC= 6.1, afebrile, CrCL ~ 40  Plan: -Zosyn 3.375gm IV q8h -d/c ancef (zosyn will provide coverage for surgical prophylaxis -Will follow renal function, cultures and clinical progress   Height: '5\' 2"'$  (157.5 cm) Weight: 43.2 kg (95 lb 3.8 oz) IBW/kg (Calculated) : 50.1  Temp (24hrs), Avg:98.4 F (36.9 C), Min:98.2 F (36.8 C), Max:98.6 F (37 C)  Recent Labs  Lab 05/15/22 0023 05/15/22 0233 05/16/22 1003 05/17/22 0012 05/18/22 0015 05/19/22 0842 05/19/22 1533 05/20/22 0016 05/20/22 1154  WBC 6.8  --  10.4  --  6.4 5.3  --  6.1  --   CREATININE  --    < > 0.81 0.87 1.09*  --  0.91 0.96 0.70   < > = values in this interval not displayed.    Estimated Creatinine Clearance: 38.3 mL/min (by C-G formula based on SCr of 0.7 mg/dL).    Allergies  Allergen Reactions   Bactrim [Sulfamethoxazole-Trimethoprim] Nausea And Vomiting   Dilaudid [Hydromorphone Hcl] Anaphylaxis   Dilaudid [Hydromorphone] Anaphylaxis   Acyclovir And Related     Unknown reaction   Crestor [Rosuvastatin Calcium] Other (See Comments)    Muscle pain   Gabapentin Swelling    Swelling in feet, and in legs   Aleve [Naproxen Sodium] Rash   Lyrica [Pregabalin] Other (See Comments)    Swelling in feet, and in legs    Thank you for allowing pharmacy to be a part of this patient's care.  Hildred Laser, PharmD Clinical Pharmacist **Pharmacist phone directory can now be found on [email or web address] ([email or web address]).  Listed under [place].

## 2022-05-20 NOTE — Progress Notes (Signed)
[facility] Progress Note: Paced at 60 Clevipine 8 Nitro 15  Sitting up in bed otherwise doing well.     Latest Ref Rng & Units 05/20/2022    1:40 PM 05/20/2022   11:57 AM 05/20/2022   11:54 AM  CBC  Hemoglobin 12.0 - 15.0 g/dL 10.2  9.5  9.5   Hematocrit 36.0 - 46.0 % 30.0  28.0  28.0        Latest Ref Rng & Units 05/20/2022    1:40 PM 05/20/2022   11:57 AM 05/20/2022   11:54 AM  CMP  Glucose 70 - 99 mg/dL   121   BUN 8 - 23 mg/dL   17   Creatinine 0.44 - 1.00 mg/dL   0.70   Sodium 135 - 145 mmol/L 136  136  137   Potassium 3.5 - 5.1 mmol/L 3.4  4.1  4.1   Chloride 98 - 111 mmol/L   102     ABG    Component Value Date/Time   PHART 7.391 05/20/2022 1340   PCO2ART 35.8 05/20/2022 1340   PO2ART 53 (L) 05/20/2022 1340   HCO3 21.7 05/20/2022 1340   TCO2 23 05/20/2022 1340   ACIDBASEDEF 3.0 (H) 05/20/2022 1340   O2SAT 87 05/20/2022 1340

## 2022-05-20 NOTE — Transfer of Care (Signed)
Immediate Anesthesia Transfer of Care Note  Patient: OLUWADARASIMI FAVOR  Procedure(s) Performed: Transcatheter Aortic Valve Replacement, Transfemoral using an Edwards SAPIEN 3 Ultra 23 MM Heart Valve (Left: Chest) INTRAOPERATIVE TRANSTHORACIC ECHOCARDIOGRAM  Patient Location: SICU  Anesthesia Type:MAC  Level of Consciousness: drowsy and patient cooperative  Airway & Oxygen Therapy: Patient Spontanous Breathing and non-rebreather face mask  Post-op Assessment: Report given to RN and Post -op Vital signs reviewed and stable  Post vital signs: Reviewed and stable  Last Vitals:  Vitals Value Taken Time  BP 113/74 05/20/22 1418  Temp    Pulse 60 05/20/22 1422  Resp 16 05/20/22 1422  SpO2 96 % 05/20/22 1422  Vitals shown include unvalidated device data.  Last Pain:  Vitals:   05/20/22 0952  TempSrc: Oral  PainSc: 0-No pain      Patients Stated Pain Goal: 0 (48/01/65 [id number])  Complications: No notable events documented.

## 2022-05-20 NOTE — Progress Notes (Signed)
Notified Nelia Shi., RN [facility], of inability to void. Bladder scan revealed 350 ml of urine. Unable to void and feeling increased pressure and pain in bladder area. She is also having continuing nausea that was relieved for only a short period w/ IV Zofran at 1935. Awaiting new orders . Continuing to monitor.

## 2022-05-20 NOTE — Discharge Summary (Incomplete)
[place] VALVE TEAM  Discharge Summary    Patient ID: Haley Trevino MRN: [id number]; DOB: 1941-03-07  Admit date: 05/13/2022 Discharge date: 05/20/2022  Primary Care Provider: [name], FNP  Primary Cardiologist: [name], MD / Dr. Ali Lowe & Dr. Cyndia Bent (TAVR)  Discharge Diagnoses    Principal Problem:   S/P TAVR (transcatheter aortic valve replacement) Active Problems:   [name]   Hypertension   Hyperlipidemia with target LDL less than 100   Aortic stenosis   Acute on chronic diastolic heart failure (HCC)   Acute respiratory distress   Anxiety   [name]   Hyperkalemia   Allergies Allergies  Allergen Reactions   Bactrim [Sulfamethoxazole-Trimethoprim] Nausea And Vomiting   Dilaudid [Hydromorphone Hcl] Anaphylaxis   Dilaudid [Hydromorphone] Anaphylaxis   Acyclovir And Related     Unknown reaction   Crestor [Rosuvastatin Calcium] Other (See Comments)    Muscle pain   Gabapentin Swelling    Swelling in feet, and in legs   Aleve [Naproxen Sodium] Rash   Lyrica [Pregabalin] Other (See Comments)    Swelling in feet, and in legs    Diagnostic Studies/Procedures    *** _____________    Echo 05/21/22: completed but pending formal read at the time of discharge   History of Present Illness     Haley Trevino is a 81 y.o. female with a history of scleroderma and Raynaud's on chronic Revatio therapy, HLD, iron deficiency anemia managed with intermittent iron infusion, chronic diastolic CHF with elevated LVEDP on cath, moderate mitral stenosis due to MAC, and severe AS who presented to [facility] on 05/13/22 for planned TAVR. However, she was found to be hypoxic on anesthesia induction and case was aborted.  She was admitted for acute respiratory failure and acute anemia.      The patient has been followed for some time and her most recent echocardiogram demonstrated  development of moderate to severe aortic stenosis.  She reported increasing dyspnea on exertion.  It was noted that her murmur seemed much more prominent versus before. Echo 03/20/22 showed EF >75%, mod concentric LVH, and severe AS with a mean grad 48 mmHg, peak grad 76.9 mmHg, AVA 1.01 cm2, DVI 0.36, SVI 78, as well as severe MAC with moderate MS. [facility] 03/28/22 showed minimal obstructive coronary artery disease.   The patient has been evaluated by the multidisciplinary valve team and felt to have severe, symptomatic aortic stenosis and to be a suitable candidate for TAVR, which was set up for 05/20/22.   Hospital Course     Consultants: none   Severe AS: s/p successful TAVR with a 23 mm Edwards Sapien 3 Ultra Resilia THV via the TF approach on 05/20/22. Post operative echo pending. Groin sites are stable. ECG with *** and no high grade heart block. Started on a baby Asprin 81 mg daily.   CHB: pt had complete heart block after valve deployment.   Acute respiratory failure with hypoxia: patient presented to [facility] for scheduled TAVR 05/13/22 found to be hypoxic on anesthesia induction with O2 saturations in the 70's. Case aborted when upon lying on operating room table. Bedside echocardiogram with no significant change from prior imaging. Patient initally required HFNC and has been weaned down to RA after diuresis with IV lasix. She then developed recurrent hypoxia after valve deployment on 11/14 possibly related to aspiration from N/V during case.   Acute on chronic diastolic CHF: repeat chest x-ray showed worsening  pulmonary edema and new trace left plural effusion. She has been treated with IV lasix 56m BID. Convetned to PO Lasix 457mdaily on 05/18/22. Net neg 4.3L. Weight down 5 lbs (100--> 95 lbs.)     Chronic iron deficiency anemia: follows closely with GI and hematology. She receives Q3 week iron infusions. Transfused 1 unit PRBC on admission. Hg stable at *** today.    Anxiety: has worsening  anxiety during admission and continued on home meds.    Hyperkalemia: ? Hemolysis. K has been elevated on several blood draws. Interesting as renal function is normal and not on any potassium elevating drugs. Last reading was ***   Moderate mitral stenosis with MAC: Continue to follow with surveillance imaging.   _____________  Discharge Vitals Blood pressure (!) 138/52, pulse 69, temperature 98.5 F (36.9 C), temperature source Oral, resp. rate 16, height _0  (1.575 m), weight 43.2 kg, SpO2 94 %.  Filed Weights   05/16/22 0604 05/19/22 0404 05/20/22 0516  Weight: 44 kg 43.1 kg 43.2 kg    *** physical exam  Labs & Radiologic Studies    CBC Recent Labs    05/19/22 0842 05/20/22 0016 05/20/22 1154 05/20/22 1157  WBC 5.3 6.1  --   --   HGB 11.2* 10.6* 9.5* 9.5*  HCT 36.1 33.4* 28.0* 28.0*  MCV 96.5 93.0  --   --   PLT 291 266  --   --    Basic Metabolic Panel Recent Labs    05/19/22 1533 05/20/22 0016 05/20/22 1154 05/20/22 1157  NA 137 134* 137 136  K 4.3 4.4 4.1 4.1  CL 101 100 102  --   CO2 26 25  --   --   GLUCOSE 98 103* 121*  --   BUN 21 24* 17  --   CREATININE 0.91 0.96 0.70  --   CALCIUM 8.9 8.7*  --   --    Liver Function Tests No results for input(s): "AST", "ALT", "ALKPHOS", "BILITOT", "PROT", "ALBUMIN" in the last 72 hours. No results for input(s): "LIPASE", "AMYLASE" in the last 72 hours. Cardiac Enzymes No results for input(s): "CKTOTAL", "CKMB", "CKMBINDEX", "TROPONINI" in the last 72 hours. BNP Invalid input(s): "POCBNP" D-Dimer No results for input(s): "DDIMER" in the last 72 hours. Hemoglobin A1C No results for input(s): "HGBA1C" in the last 72 hours. Fasting Lipid Panel No results for input(s): "CHOL", "HDL", "LDLCALC", "TRIG", "CHOLHDL", "LDLDIRECT" in the last 72 hours. Thyroid Function Tests No results for input(s): "TSH", "T4TOTAL", "T3FREE", "THYROIDAB" in the last 72 hours.  Invalid input(s): "FREET3" _____________   ECHOCARDIOGRAM LIMITED  Result Date: 05/20/2022    ECHOCARDIOGRAM LIMITED REPORT   Patient Name:   Haley FRIEZEate of Exam: 05/20/2022 Medical Rec #:  [id number]        Height:       62.0 in Accession #:    [id number]       Weight:       95.2 lb Date of Birth:  1/06-Jun-1941        BSA:          1.395 m Patient Age:    89ears           BP:           185/65 mmHg Patient Gender: F                  HR:  69 bpm. Exam Location:  Inpatient Procedure: Limited Echo, Cardiac Doppler and Color Doppler Indications:    Aortic valve disorder I35.9  History:        Patient has prior history of Echocardiogram examinations, most                 recent 05/13/2022. Aortic Valve Disease and Mitral Valve Disease;                 Risk Factors:Hypertension and Dyslipidemia.                 Aortic Valve: 23 mm Sapien prosthetic, stented (TAVR) valve is                 present in the aortic position. Procedure Date: 05/20/2022.  Sonographer:    Darlina Sicilian RDCS Referring Phys: [id number] [place]  1. Note patient had CHB post deployment and required temporary pacing.  2. Left ventricular ejection fraction, by estimation, is 60 to 65%. The left ventricle has normal function. There is mild left ventricular hypertrophy.  3. Right ventricular systolic function is normal. The right ventricular size is normal.  4. Left atrial size was moderately dilated.  5. Severe bulky/caseating MAC particularly posterior annulus Moderate funcitonal MS. The mitral valve is degenerative. Trivial mitral valve regurgitation. Moderate mitral stenosis. Severe mitral annular calcification.  6. Pre TAVR: calcified tri leaflet valve with restricted motion mean gradient 41 peak 65 mmHg AVA 0.89 cm2         Post TAVR : slightly deep position of 23 mm Sapien 3 valve. No PVL mean gradient 7 peak 17 mmhg with AVA 2.3 cm2. There is a 23 mm Sapien prosthetic (TAVR) valve present in the aortic position. Procedure Date: 05/20/2022.  FINDINGS  Left Ventricle: Left ventricular ejection fraction, by estimation, is 60 to 65%. The left ventricle has normal function. The left ventricular internal cavity size was normal in size. There is mild left ventricular hypertrophy. Right Ventricle: The right ventricular size is normal. Right vetricular wall thickness was not assessed. Right ventricular systolic function is normal. Left Atrium: Left atrial size was moderately dilated. Pericardium: There is no evidence of pericardial effusion. Mitral Valve: Severe bulky/caseating MAC particularly posterior annulus Moderate funcitonal MS. The mitral valve is degenerative in appearance. There is severe thickening of the mitral valve leaflet(s). There is severe calcification of the mitral valve leaflet(s). Severe mitral annular calcification. Trivial mitral valve regurgitation. Moderate mitral valve stenosis. MV peak gradient, 11.3 mmHg. The mean mitral valve gradient is 4.0 mmHg. Tricuspid Valve: The tricuspid valve is not assessed. Aortic Valve: Pre TAVR: calcified tri leaflet valve with restricted motion mean gradient 41 peak 65 mmHg AVA 0.89 cm2 Post TAVR : slightly deep position of 23 mm Sapien 3 valve. No PVL mean gradient 7 peak 17 mmhg with AVA 2.3 cm2. Aortic valve mean gradient measures 41.0 mmHg. Aortic valve peak gradient measures 65.3 mmHg. Aortic valve area, by VTI measures 0.89 cm. There is a 23 mm Sapien prosthetic, stented (TAVR) valve present in the aortic position. Procedure Date: 05/20/2022. Pulmonic Valve: The pulmonic valve was not assessed. Aorta: The aortic root was not well visualized. Additional Comments: Note patient had CHB post deployment and required temporary pacing.  LEFT VENTRICLE PLAX 2D LVOT diam:     1.90 cm LV SV:         82 LV SV Index:   59 LVOT Area:     2.84 cm  AORTIC VALVE AV  Area (Vmax):    0.87 cm AV Area (Vmean):   0.87 cm AV Area (VTI):     0.89 cm AV Vmax:           404.00 cm/s AV Vmean:          304.000 cm/s AV  VTI:            0.927 m AV Peak Grad:      65.3 mmHg AV Mean Grad:      41.0 mmHg LVOT Vmax:         124.00 cm/s LVOT Vmean:        93.800 cm/s LVOT VTI:          0.290 m LVOT/AV VTI ratio: 0.31 MITRAL VALVE MV Area VTI:  1.54 cm   SHUNTS MV Peak grad: 11.3 mmHg  Systemic VTI:  0.29 m MV Mean grad: 4.0 mmHg   Systemic Diam: 1.90 cm MV Vmax:      1.68 m/s MV Vmean:     97.8 cm/s Jenkins Rouge MD Electronically signed by Jenkins Rouge MD Signature Date/Time: 05/20/2022/1:20:12 PM    Final    DG Chest Port 1V same Day  Result Date: 05/17/2022 CLINICAL DATA:  Congestive heart failure. EXAM: PORTABLE CHEST 1 VIEW COMPARISON:  05/15/2022 FINDINGS: Stable cardiomediastinal contours. Aortic atherosclerotic calcifications. Mild interstitial edema pattern is improved compared with the previous exam. Decrease in pleural effusions. No airspace consolidation. IMPRESSION: Interval improvement in CHF pattern. Electronically Signed   By: Kerby Moors M.D.   On: 05/17/2022 07:32   DG Chest Port 1V same Day  Result Date: 05/15/2022 CLINICAL DATA:  Congestive heart failure EXAM: PORTABLE CHEST 1 VIEW COMPARISON:  Chest x-ray dated May 13, 2022 FINDINGS: Cardiomegaly. Diffuse interstitial opacities, slightly worsened when compared with prior. New trace left pleural effusion. No evidence of pneumothorax. IMPRESSION: 1. Worsening pulmonary edema. 2. New trace left pleural effusion. Electronically Signed   By: Yetta Glassman M.D.   On: 05/15/2022 08:30   DG Chest 2 View  Result Date: 05/13/2022 CLINICAL DATA:  Preop for heart surgery tomorrow. EXAM: CHEST - 2 VIEW COMPARISON:  CT scan 04/07/2022 FINDINGS: The cardiac silhouette, mediastinal and hilar contours are within normal limits. There is mild tortuosity and calcification of the thoracic aorta. Mild emphysematous changes with basilar pleural thickening versus small pleural effusions. No infiltrates or edema. The bony thorax is intact. Stable vertebral augmentation  changes in the thoracic spine. IMPRESSION: Mild emphysematous changes and basilar pleural thickening versus small pleural effusions. Electronically Signed   By: Marijo Sanes M.D.   On: 05/13/2022 11:20   DG CHEST PORT 1 VIEW  Result Date: 05/13/2022 CLINICAL DATA:  Hypoxia during TAVR. EXAM: PORTABLE CHEST 1 VIEW COMPARISON:  05/12/2022 FINDINGS: The cardiac silhouette, mediastinal and hilar contours within normal limits given the AP projection and portable technique. Mild old central vascular congestion and slight increased interstitial markings with Kerley B-lines noted suggesting mild interstitial edema. No pleural effusions. No pneumothorax. IMPRESSION: Mild interstitial edema. Electronically Signed   By: Marijo Sanes M.D.   On: 05/13/2022 11:17   ECHOCARDIOGRAM LIMITED  Result Date: 05/13/2022    ECHOCARDIOGRAM LIMITED REPORT   Patient Name:   Haley Trevino Date of Exam: 05/13/2022 Medical Rec #:  [id number]          Height:       62.0 in Accession #:    [id number]         Weight:  99.0 lb Date of Birth:  [birth year]          BSA:          1.418 m Patient Age:    60 years           BP:           209/70 mmHg Patient Gender: F                  HR:           88 bpm. Exam Location:  Inpatient Procedure: Limited Echo, Color Doppler and Cardiac Doppler Indications:     Aortic Stenosis i35.0  History:         Patient has prior history of Echocardiogram examinations, most                  recent 03/20/2022. Risk Factors:Hypertension and Dyslipidemia.  Sonographer:     Raquel Sarna Senior RDCS Referring Phys:  [place]: Sanda Klein MD IMPRESSIONS  1. Left ventricular ejection fraction, by estimation, is 70 to 75%. The left ventricle has hyperdynamic function. The left ventricle has no regional wall motion abnormalities. There is mild concentric left ventricular hypertrophy.  2. Right ventricular systolic function is normal. The right ventricular size is normal. There is  moderately elevated pulmonary artery systolic pressure. The estimated right ventricular systolic pressure is 16.1 mmHg.  3. Left atrial size was severely dilated.  4. The mitral valve is degenerative. Trivial mitral valve regurgitation. Mild to moderate mitral stenosis. The mean mitral valve gradient is 9.9 mmHg with average heart rate of 85 bpm. Severe mitral annular calcification.  5. There is severe calcifcation of the aortic valve. There is severe thickening of the aortic valve. Severe aortic valve stenosis.  6. The inferior vena cava is normal in size with greater than 50% respiratory variability, suggesting right atrial pressure of 3 mmHg. Comparison(s): Prior images reviewed side by side. PA pressure is higher. Otherwise little change. FINDINGS  Left Ventricle: Left ventricular ejection fraction, by estimation, is 70 to 75%. The left ventricle has hyperdynamic function. The left ventricle has no regional wall motion abnormalities. There is mild concentric left ventricular hypertrophy. Right Ventricle: The right ventricular size is normal. Right vetricular wall thickness was not well visualized. Right ventricular systolic function is normal. There is moderately elevated pulmonary artery systolic pressure. The tricuspid regurgitant velocity is 3.54 m/s, and with an assumed right atrial pressure of 3 mmHg, the estimated right ventricular systolic pressure is 09.6 mmHg. Left Atrium: Left atrial size was severely dilated. Right Atrium: Right atrial size was normal in size. Mitral Valve: The mitral valve is degenerative in appearance. Severe mitral annular calcification. Trivial mitral valve regurgitation. Mild to moderate mitral valve stenosis. MV peak gradient, 20.8 mmHg. The mean mitral valve gradient is 9.9 mmHg with average heart rate of 85 bpm. Tricuspid Valve: The tricuspid valve is normal in structure. Tricuspid valve regurgitation is mild. Aortic Valve: There is severe calcifcation of the aortic valve.  There is severe thickening of the aortic valve. Severe aortic stenosis is present. Aortic valve mean gradient measures 39.4 mmHg. Aortic valve peak gradient measures 72.6 mmHg. Aortic valve area, by VTI measures 0.93 cm. Pulmonic Valve: The pulmonic valve was grossly normal. Pulmonic valve regurgitation is not visualized. Venous: The inferior vena cava is normal in size with greater than 50% respiratory variability, suggesting right atrial pressure of 3 mmHg. IAS/Shunts: The interatrial septum was not well visualized. Additional Comments:  Spectral Doppler performed. Color Doppler performed.  LEFT VENTRICLE PLAX 2D LVOT diam:     1.90 cm LV SV:         82 LV SV Index:   58 LVOT Area:     2.84 cm  AORTIC VALVE AV Area (Vmax):    0.92 cm AV Area (Vmean):   0.89 cm AV Area (VTI):     0.93 cm AV Vmax:           426.02 cm/s AV Vmean:          290.130 cm/s AV VTI:            0.879 m AV Peak Grad:      72.6 mmHg AV Mean Grad:      39.4 mmHg LVOT Vmax:         138.98 cm/s LVOT Vmean:        91.347 cm/s LVOT VTI:          0.289 m LVOT/AV VTI ratio: 0.33 MITRAL VALVE              TRICUSPID VALVE MV Area (PHT): 2.29 cm   TR Peak grad:   50.1 mmHg MV Area VTI:   1.59 cm   TR Vmax:        354.00 cm/s MV Peak grad:  20.8 mmHg MV Mean grad:  9.9 mmHg   SHUNTS MV Vmax:       2.28 m/s   Systemic VTI:  0.29 m MV VTI:        0.52 m     Systemic Diam: 1.90 cm MV Decel Time: 331 msec Mihai Croitoru MD Electronically signed by Sanda Klein MD Signature Date/Time: 05/13/2022/10:30:51 AM    Final    Structural Heart Procedure  Result Date: 05/13/2022 See surgical note for result.  Disposition   Pt is being discharged home today in good condition.  Follow-up Plans & Appointments       Discharge Medications   Allergies as of 05/20/2022       Reactions   Bactrim [sulfamethoxazole-trimethoprim] Nausea And Vomiting   Dilaudid [hydromorphone Hcl] Anaphylaxis   Dilaudid [hydromorphone] Anaphylaxis   Acyclovir And  Related    Unknown reaction   Crestor [rosuvastatin Calcium] Other (See Comments)   Muscle pain   Gabapentin Swelling   Swelling in feet, and in legs   Aleve [naproxen Sodium] Rash   Lyrica [pregabalin] Other (See Comments)   Swelling in feet, and in legs     Med Rec must be completed prior to using this [place]***           Outstanding Labs/Studies   ***  Duration of Discharge Encounter   Greater than 30 minutes including physician time.  SignedAngelena Form, PA-C 05/20/2022, 1:33 PM [phone]

## 2022-05-20 NOTE — Progress Notes (Signed)
Mobility Specialist Progress Note    05/20/22 0834  Mobility  Activity Ambulated independently in hallway  Level of Assistance Standby assist, set-up cues, supervision of patient - no hands on  Assistive Device None  Distance Ambulated (ft) 220 ft  Activity Response Tolerated well  Mobility Referral Yes  $Mobility charge 1 Mobility   Pre-Mobility: 68 HR, 95% SpO2 During Mobility: 111 HR Post-Mobility: 91 HR, 91% SpO2  Pt received in bed and agreeable. No complaints on walk. Returned to bed with call bell in reach.    Hildred Alamin Mobility Specialist  Please Psychologist, sport and exercise or Rehab Office at [phone]

## 2022-05-21 ENCOUNTER — Encounter (HOSPITAL_COMMUNITY): Payer: Self-pay | Admitting: Internal Medicine

## 2022-05-21 ENCOUNTER — Inpatient Hospital Stay (HOSPITAL_COMMUNITY): Payer: Medicare Other

## 2022-05-21 DIAGNOSIS — Z952 Presence of prosthetic heart valve: Secondary | ICD-10-CM | POA: Diagnosis not present

## 2022-05-21 DIAGNOSIS — J9601 Acute respiratory failure with hypoxia: Secondary | ICD-10-CM | POA: Diagnosis not present

## 2022-05-21 DIAGNOSIS — I5033 Acute on chronic diastolic (congestive) heart failure: Secondary | ICD-10-CM | POA: Diagnosis not present

## 2022-05-21 DIAGNOSIS — I442 Atrioventricular block, complete: Secondary | ICD-10-CM | POA: Diagnosis not present

## 2022-05-21 LAB — BASIC METABOLIC PANEL
Anion gap: 10 (ref 5–15)
BUN: 24 mg/dL — ABNORMAL HIGH (ref 8–23)
CO2: 22 mmol/L (ref 22–32)
Calcium: 8.7 mg/dL — ABNORMAL LOW (ref 8.9–10.3)
Chloride: 104 mmol/L (ref 98–111)
Creatinine, Ser: 1.45 mg/dL — ABNORMAL HIGH (ref 0.44–1.00)
GFR, Estimated: 36 mL/min — ABNORMAL LOW (ref >60)
Glucose, Bld: 147 mg/dL — ABNORMAL HIGH (ref 70–99)
Potassium: 4.3 mmol/L (ref 3.5–5.1)
Sodium: 136 mmol/L (ref 135–145)

## 2022-05-21 LAB — ECHOCARDIOGRAM COMPLETE
AR max vel: 2.25 cm2
AV Area VTI: 2.57 cm2
AV Area mean vel: 2.26 cm2
AV Mean grad: 11.5 mmHg
AV Peak grad: 24.8 mmHg
Ao pk vel: 2.49 m/s
Height: 62 in
MV VTI: 1.49 cm2
S' Lateral: 2.2 cm
Weight: 1590.84 oz

## 2022-05-21 LAB — CBC
HCT: 33.3 % — ABNORMAL LOW (ref 36.0–46.0)
Hemoglobin: 10.4 g/dL — ABNORMAL LOW (ref 12.0–15.0)
MCH: 29.8 pg (ref 26.0–34.0)
MCHC: 31.2 g/dL (ref 30.0–36.0)
MCV: 95.4 fL (ref 80.0–100.0)
Platelets: 213 10*3/uL (ref 150–400)
RBC: 3.49 MIL/uL — ABNORMAL LOW (ref 3.87–5.11)
RDW: 16.1 % — ABNORMAL HIGH (ref 11.5–15.5)
WBC: 18.8 10*3/uL — ABNORMAL HIGH (ref 4.0–10.5)
nRBC: 0 % (ref 0.0–0.2)

## 2022-05-21 LAB — MAGNESIUM: Magnesium: 2 mg/dL (ref 1.7–2.4)

## 2022-05-21 MED ORDER — NIFEDIPINE ER OSMOTIC RELEASE 60 MG PO TB24
60.0000 mg | ORAL_TABLET | Freq: Every day | ORAL | Status: DC
  Administered 2022-05-21 – 2022-05-29 (×9): 60 mg via ORAL
  Filled 2022-05-21 (×9): qty 1

## 2022-05-21 NOTE — Plan of Care (Signed)

## 2022-05-21 NOTE — Consult Note (Addendum)
ELECTROPHYSIOLOGY CONSULT NOTE    Patient ID: Haley Trevino MRN: [id number], DOB/AGE: 1940-11-04 81 y.o.  Admit date: 05/13/2022 Date of Consult: 05/21/2022  Primary Physician: [name], [place] Primary Cardiologist: [name], MD  Electrophysiologist: New   Referring Provider: Dr. Ali Lowe  Patient Profile: Haley Trevino is a 81 y.o. female with a history of Mod/Sever AS, moderate mitral stenosis, IDA, HLD, and scleroderma who is being seen today for the evaluation of CHB s/p TVAR at the request of Dr. Ali Lowe.  HPI:  Haley Trevino is a 81 y.o. female with medical history as above  Pt previously seen in office with NYHA III dyspnea. Echo 03/20/2022 showed likely severe AS.   Cath 03/28/2022 with minimal CAD and preserved cardiac output.   Pt presented 11/7 for planned TVAR but was found to be hypoxic on anesthesia induction with O2 saturations in the 70's. Case aborted when upon lying on operating room table. Bedside echocardiogram with no significant change from prior imaging. Hemodynamics were stable. PAT lab work showed a Hb of 8.6 with iSTAT at 7.5>>then 7.0 confirmed on venous stick day of procedure. In discussions with anesthesia, Dr Angelena Form and Dr Ali Lowe, it was felt best to not proceed with TAVR but to cancel case and admit for work up of hypoxia. Repeat labs showed Hb at 7.0 therefore transfused with 1 unit PRBC with improvement. Patient feels hypoxia is anxiety driven. Additional episode overnight requiring FLNC>>now with saturations greater than 95%.   Pt was diuresed and re-posted for TAVR 11/14. She was weaned to low O2 (1L [place]) prior to procedure.  Potassium4.3 (11/15 0329) Magnesium  2.0 (11/15 0329) Creatinine, ser  1.45* (11/15 0329) PLT  213 (11/15 0329) HGB  10.4* (11/15 0329) WBC 18.8* (11/15 0329)  Pt underwent TVAR 11/14. Course complicated by development of CHB during the case, as well as N/V.   CCM following and suspect  hypoxia may have been due to "recumbency and increased venous return." Post TAVR cannot rule out aspiration pnuemonitis   Currently, pt is lying flat in bed at rest. O2 is off, Pulse Ox ~89-91%.     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 Ali Lowe & Bartle   Scleroderma [facility])    Thyroid disease    hypothyroidism     Surgical History:  Past Surgical History:  Procedure Laterality Date   BREAST ENHANCEMENT SURGERY  1975   BREAST IMPLANT REMOVAL Bilateral 04/23/2016   Procedure: REMOVALBILATERAL BREAST IMPLANTS;  Surgeon: Wallace Going, DO;  Location: [place];  Service: Plastics;  Laterality: Bilateral;   CHOLECYSTECTOMY  1973   ESOPHAGOGASTRODUODENOSCOPY N/A 10/25/2020   Procedure: ESOPHAGOGASTRODUODENOSCOPY (EGD);  Surgeon: Clarene Essex, MD;  Location: [facility];  Service: Endoscopy;  Laterality: N/A;  enteroscopy   HAND SURGERY  08/2012; 11/2012   HOT HEMOSTASIS N/A 10/25/2020   Procedure: HOT HEMOSTASIS (ARGON PLASMA COAGULATION/BICAP);  Surgeon: Clarene Essex, MD;  Location: [facility];  Service: Endoscopy;  Laterality: N/A;   INTRAOPERATIVE TRANSTHORACIC ECHOCARDIOGRAM N/A 05/13/2022   Procedure: INTRAOPERATIVE TRANSTHORACIC ECHOCARDIOGRAM;  Surgeon: Burnell Blanks, MD;  Location: [facility]  INVASIVE CV LAB;  Service: Open Heart Surgery;  Laterality: N/A;   IR RADIOLOGIST EVAL & MGMT  07/27/2017   IR SACROPLASTY BILATERAL  07/31/2017   IR VERTEBROPLASTY CERV/THOR BX INC UNI/BIL INC/INJECT/IMAGING  03/13/2020   IR VERTEBROPLASTY EA ADDL (T&LS) BX INC UNI/BIL INC INJECT/IMAGING  03/14/2020   MELANOMA EXCISION     at 31 yrs of age   ORIF HIP  FRACTURE Right 02/22/2014   Procedure: OPEN REDUCTION INTERNAL FIXATION RIGHT HIP;  Surgeon: Sanjuana Kava, MD;  Location: [facility];  Service: Orthopedics;  Laterality: Right;   RIGHT HEART CATH AND CORONARY ANGIOGRAPHY N/A 03/28/2022   Procedure: RIGHT HEART CATH AND CORONARY ANGIOGRAPHY;  Surgeon: Early Osmond, MD;  Location: [place] CV LAB;  Service: Cardiovascular;  Laterality: N/A;   TOTAL ABDOMINAL HYSTERECTOMY  1974   TRANSCATHETER AORTIC VALVE REPLACEMENT, TRANSFEMORAL Left 05/13/2022   Procedure: Transcatheter Aortic Valve Replacement, Transfemoral;  Surgeon: Burnell Blanks, MD;  Location: [place] CV LAB;  Service: Open Heart Surgery;  Laterality: Left;     Medications Prior to Admission  Medication Sig Dispense Refill Last Dose   acetaminophen (TYLENOL) 500 MG tablet Take 1,000 mg by mouth at bedtime.   05/12/2022   CALCIUM PO Take 600 mg by mouth daily.   Past Week   Cholecalciferol (VITAMIN D) 50 MCG (2000 UT) tablet Take 2,000 Units by mouth daily.   Past Week   clonazePAM (KLONOPIN) 0.5 MG tablet Take 1 tablet (0.5 mg total) by mouth 2 (two) times daily as needed for anxiety. 180 tablet 1 05/12/2022   ezetimibe (ZETIA) 10 MG tablet Take 1 tablet (10 mg total) by mouth daily. 90 tablet 1 05/12/2022   FLUoxetine (PROZAC) 40 MG capsule Take 1 capsule (40 mg total) by mouth daily. 90 capsule 1 05/12/2022   fluticasone (FLONASE) 50 MCG/ACT nasal spray PLACE 2 SPRAYS INTO BOTH NOSTRILS DAILY (Patient taking differently: Place 2 sprays into both nostrils daily as needed for allergies.) 16 g 6 05/12/2022   furosemide (LASIX) 20 MG tablet Take 1 tablet (20 mg total) by mouth daily. 90 tablet 3 Past Week   Multiple Vitamins-Minerals (PRESERVISION AREDS 2+MULTI VIT PO) Take 1 capsule by mouth in the morning and at bedtime.   Past Week   NIFEdipine (PROCARDIA XL/NIFEDICAL XL) 60 MG 24 hr tablet Take 1 tablet (60 mg total) by mouth daily. 90 tablet 1 05/12/2022   omeprazole (PRILOSEC)  20 MG capsule TAKE 1 TABLET DAILY 90 capsule 1 05/12/2022   RESTASIS 0.05 % ophthalmic emulsion Place 1 drop into both eyes 2 (two) times daily.   05/12/2022   sildenafil (REVATIO) 20 MG tablet Take 1 tablet (20 mg total) by mouth 2 (two) times daily. 180 tablet 1 05/12/2022   SYNTHROID 75 MCG tablet TAKE (1) TABLET DAILY BE- FORE BREAKFAST. 90 tablet 1 05/13/2022 at 0430   PROLIA 60 MG/ML SOSY injection Inject 60 mg into the skin every 6 (six) months. 180 mL 0 More than a month    Inpatient Medications:   acetaminophen  1,000 mg Oral QHS   aspirin  81 mg Oral Daily   Chlorhexidine Gluconate Cloth  6 each Topical Daily   cycloSPORINE  1 drop Both Eyes BID   ezetimibe  10 mg Oral Daily   FLUoxetine  40 mg Oral Daily   levothyroxine  75 mcg Oral Q0600   NIFEdipine  60 mg Oral Daily   pantoprazole  40 mg Oral Daily   sildenafil  20  mg Oral BID   sodium chloride flush  3 mL Intravenous Q12H    Allergies:  Allergies  Allergen Reactions   Bactrim [Sulfamethoxazole-Trimethoprim] Nausea And Vomiting   Dilaudid [Hydromorphone Hcl] Anaphylaxis   Dilaudid [Hydromorphone] Anaphylaxis   Acyclovir And Related     Unknown reaction   Crestor [Rosuvastatin Calcium] Other (See Comments)    Muscle pain   Gabapentin Swelling    Swelling in feet, and in legs   Aleve [Naproxen Sodium] Rash   Lyrica [Pregabalin] Other (See Comments)    Swelling in feet, and in legs    Social History   Socioeconomic History   Marital status: Married    Spouse name: Not on file   Number of children: 2   Years of education: Not on file   Highest education level: Some college, no degree  Occupational History   Occupation: retired Occupational psychologist at [employer]  Tobacco Use   Smoking status: Never   Smokeless tobacco: Never   Tobacco comments:    passive tobacco smoke exposure as child  Vaping Use   Vaping Use: Never used  Substance and Sexual Activity   Alcohol use: No   Drug use: No   Sexual activity: Not  Currently    Birth control/protection: Surgical  Other Topics Concern   Not on file  Social History Narrative   Regular exercise: walkingCaffeine use: 1 cup of coffee daily   Lives in split level home with her husband   Social Determinants of Health   Financial Resource Strain: Low Risk  (04/29/2021)   Overall Financial Resource Strain (CARDIA)    Difficulty of Paying Living Expenses: Not hard at all  Food Insecurity: No Food Insecurity (04/29/2021)   Hunger Vital Sign    Worried About Running Out of Food in the Last Year: Never true    [place] in the Last Year: Never true  Transportation Needs: No Transportation Needs (04/29/2021)   PRAPARE - Hydrologist (Medical): No    Lack of Transportation (Non-Medical): No  Physical Activity: Inactive (06/14/2021)   Exercise Vital Sign    Days of Exercise per Week: 0 days    Minutes of Exercise per Session: 0 min  Stress: Stress Concern Present (06/14/2021)   [place]    Feeling of Stress : Rather much  Social Connections: Socially Integrated (04/29/2021)   Social Connection and Isolation Panel [NHANES]    Frequency of Communication with Friends and Family: More than three times a week    Frequency of Social Gatherings with Friends and Family: Once a week    Attends Religious Services: 1 to 4 times per year    Active Member of [employer] or Organizations: Yes    Attends Archivist Meetings: 1 to 4 times per year    Marital Status: Married  Human resources officer Violence: Not At Risk (04/29/2021)   Humiliation, Afraid, Rape, and Kick questionnaire    Fear of Current or Ex-Partner: No    Emotionally Abused: No    Physically Abused: No    Sexually Abused: No     Family History  Problem Relation Age of Onset   Pancreatic cancer Father    Raynaud syndrome Father    Cancer Father        pancreatic   Rashes / Skin problems Daughter         possibly scleraderma   Hip fracture Mother  Mental illness Mother        attempted suicide at 64 yo     Review of Systems: All other systems reviewed and are otherwise negative except as noted above.  Physical Exam: Vitals:   05/21/22 0615 05/21/22 0630 05/21/22 0700 05/21/22 0800  BP:  (!) 121/53 (!) 128/54 114/80  Pulse: 60 60 60 60  Resp: _0 Temp:    98 F (36.7 C)  TempSrc:    Oral  SpO2: 96% 98% 100% 100%  Weight:      Height:        GEN- The patient is well appearing, alert and oriented x 3 today.   HEENT: normocephalic, atraumatic; sclera clear, conjunctiva pink; hearing intact; oropharynx clear; neck supple Lungs- Clear to ausculation bilaterally, normal work of breathing.  No wheezes, rales, rhonchi Heart- Regular rate and rhythm, no murmurs, rubs or gallops GI- soft, non-tender, non-distended, bowel sounds present Extremities- no clubbing, cyanosis, or edema; DP/PT/radial pulses 2+ bilaterally MS- no significant deformity or atrophy Skin- warm and dry, no rash or lesion Psych- euthymic mood, full affect Neuro- strength and sensation are intact  Labs:   Lab Results  Component Value Date   WBC 18.8 (H) 05/21/2022   HGB 10.4 (L) 05/21/2022   HCT 33.3 (L) 05/21/2022   MCV 95.4 05/21/2022   PLT 213 05/21/2022    Recent Labs  Lab 05/21/22 0329  NA 136  K 4.3  CL 104  CO2 22  BUN 24*  CREATININE 1.45*  CALCIUM 8.7*  GLUCOSE 147*      Radiology/Studies: DG Chest Port 1 View  Result Date: 05/20/2022 CLINICAL DATA:  Vomiting. EXAM: PORTABLE CHEST 1 VIEW COMPARISON:  May 17, 2022. FINDINGS: Stable cardiomegaly. Mild central pulmonary vascular congestion is noted with probable minimal bilateral pulmonary edema. Status post kyphoplasty at multiple levels in the midthoracic spine. Status post transcatheter aortic valve repair. Right internal jugular pacing lead is noted with tip in expected position of right ventricle. IMPRESSION:  Stable cardiomegaly with mild central pulmonary vascular congestion and possible minimal bilateral pulmonary edema. Status post transcatheter aortic valve repair. Electronically Signed   By: Marijo Conception M.D.   On: 05/20/2022 15:42   Structural Heart Procedure  Result Date: 05/20/2022 See surgical note for result.  ECHOCARDIOGRAM LIMITED  Result Date: 05/20/2022    ECHOCARDIOGRAM LIMITED REPORT   Patient Name:   GLENNETTE GALSTER Date of Exam: 05/20/2022 Medical Rec #:  [id number]          Height:       62.0 in Accession #:    [id number]         Weight:       95.2 lb Date of Birth:  1941-01-30          BSA:          1.395 m Patient Age:    65 years           BP:           185/65 mmHg Patient Gender: F                  HR:           69 bpm. Exam Location:  Inpatient Procedure: Limited Echo, Cardiac Doppler and Color Doppler Indications:    Aortic valve disorder I35.9  History:        Patient has prior history of Echocardiogram examinations, most  recent 05/13/2022. Aortic Valve Disease and Mitral Valve Disease;                 Risk Factors:Hypertension and Dyslipidemia.                 Aortic Valve: 23 mm Sapien prosthetic, stented (TAVR) valve is                 present in the aortic position. Procedure Date: 05/20/2022.  Sonographer:    Darlina Sicilian RDCS Referring Phys: [id number] [place]  1. Note patient had CHB post deployment and required temporary pacing.  2. Left ventricular ejection fraction, by estimation, is 60 to 65%. The left ventricle has normal function. There is mild left ventricular hypertrophy.  3. Right ventricular systolic function is normal. The right ventricular size is normal.  4. Left atrial size was moderately dilated.  5. Severe bulky/caseating MAC particularly posterior annulus Moderate funcitonal MS. The mitral valve is degenerative. Trivial mitral valve regurgitation. Moderate mitral stenosis. Severe mitral annular calcification.  6. Pre TAVR:  calcified tri leaflet valve with restricted motion mean gradient 41 peak 65 mmHg AVA 0.89 cm2         Post TAVR : slightly deep position of 23 mm Sapien 3 valve. No PVL mean gradient 7 peak 17 mmhg with AVA 2.3 cm2. There is a 23 mm Sapien prosthetic (TAVR) valve present in the aortic position. Procedure Date: 05/20/2022. FINDINGS  Left Ventricle: Left ventricular ejection fraction, by estimation, is 60 to 65%. The left ventricle has normal function. The left ventricular internal cavity size was normal in size. There is mild left ventricular hypertrophy. Right Ventricle: The right ventricular size is normal. Right vetricular wall thickness was not assessed. Right ventricular systolic function is normal. Left Atrium: Left atrial size was moderately dilated. Pericardium: There is no evidence of pericardial effusion. Mitral Valve: Severe bulky/caseating MAC particularly posterior annulus Moderate funcitonal MS. The mitral valve is degenerative in appearance. There is severe thickening of the mitral valve leaflet(s). There is severe calcification of the mitral valve leaflet(s). Severe mitral annular calcification. Trivial mitral valve regurgitation. Moderate mitral valve stenosis. MV peak gradient, 11.3 mmHg. The mean mitral valve gradient is 4.0 mmHg. Tricuspid Valve: The tricuspid valve is not assessed. Aortic Valve: Pre TAVR: calcified tri leaflet valve with restricted motion mean gradient 41 peak 65 mmHg AVA 0.89 cm2 Post TAVR : slightly deep position of 23 mm Sapien 3 valve. No PVL mean gradient 7 peak 17 mmhg with AVA 2.3 cm2. Aortic valve mean gradient measures 41.0 mmHg. Aortic valve peak gradient measures 65.3 mmHg. Aortic valve area, by VTI measures 0.89 cm. There is a 23 mm Sapien prosthetic, stented (TAVR) valve present in the aortic position. Procedure Date: 05/20/2022. Pulmonic Valve: The pulmonic valve was not assessed. Aorta: The aortic root was not well visualized. Additional Comments: Note patient had  CHB post deployment and required temporary pacing.  LEFT VENTRICLE PLAX 2D LVOT diam:     1.90 cm LV SV:         82 LV SV Index:   59 LVOT Area:     2.84 cm  AORTIC VALVE AV Area (Vmax):    0.87 cm AV Area (Vmean):   0.87 cm AV Area (VTI):     0.89 cm AV Vmax:           404.00 cm/s AV Vmean:          304.000 cm/s AV VTI:  0.927 m AV Peak Grad:      65.3 mmHg AV Mean Grad:      41.0 mmHg LVOT Vmax:         124.00 cm/s LVOT Vmean:        93.800 cm/s LVOT VTI:          0.290 m LVOT/AV VTI ratio: 0.31 MITRAL VALVE MV Area VTI:  1.54 cm   SHUNTS MV Peak grad: 11.3 mmHg  Systemic VTI:  0.29 m MV Mean grad: 4.0 mmHg   Systemic Diam: 1.90 cm MV Vmax:      1.68 m/s MV Vmean:     97.8 cm/s Jenkins Rouge MD Electronically signed by Jenkins Rouge MD Signature Date/Time: 05/20/2022/1:20:12 PM    Final    DG Chest Port 1V same Day  Result Date: 05/17/2022 CLINICAL DATA:  Congestive heart failure. EXAM: PORTABLE CHEST 1 VIEW COMPARISON:  05/15/2022 FINDINGS: Stable cardiomediastinal contours. Aortic atherosclerotic calcifications. Mild interstitial edema pattern is improved compared with the previous exam. Decrease in pleural effusions. No airspace consolidation. IMPRESSION: Interval improvement in CHF pattern. Electronically Signed   By: Kerby Moors M.D.   On: 05/17/2022 07:32   DG Chest Port 1V same Day  Result Date: 05/15/2022 CLINICAL DATA:  Congestive heart failure EXAM: PORTABLE CHEST 1 VIEW COMPARISON:  Chest x-ray dated May 13, 2022 FINDINGS: Cardiomegaly. Diffuse interstitial opacities, slightly worsened when compared with prior. New trace left pleural effusion. No evidence of pneumothorax. IMPRESSION: 1. Worsening pulmonary edema. 2. New trace left pleural effusion. Electronically Signed   By: Yetta Glassman M.D.   On: 05/15/2022 08:30   DG Chest 2 View  Result Date: 05/13/2022 CLINICAL DATA:  Preop for heart surgery tomorrow. EXAM: CHEST - 2 VIEW COMPARISON:  CT scan 04/07/2022  FINDINGS: The cardiac silhouette, mediastinal and hilar contours are within normal limits. There is mild tortuosity and calcification of the thoracic aorta. Mild emphysematous changes with basilar pleural thickening versus small pleural effusions. No infiltrates or edema. The bony thorax is intact. Stable vertebral augmentation changes in the thoracic spine. IMPRESSION: Mild emphysematous changes and basilar pleural thickening versus small pleural effusions. Electronically Signed   By: Marijo Sanes M.D.   On: 05/13/2022 11:20   DG CHEST PORT 1 VIEW  Result Date: 05/13/2022 CLINICAL DATA:  Hypoxia during TAVR. EXAM: PORTABLE CHEST 1 VIEW COMPARISON:  05/12/2022 FINDINGS: The cardiac silhouette, mediastinal and hilar contours within normal limits given the AP projection and portable technique. Mild old central vascular congestion and slight increased interstitial markings with Kerley B-lines noted suggesting mild interstitial edema. No pleural effusions. No pneumothorax. IMPRESSION: Mild interstitial edema. Electronically Signed   By: Marijo Sanes M.D.   On: 05/13/2022 11:17   ECHOCARDIOGRAM LIMITED  Result Date: 05/13/2022    ECHOCARDIOGRAM LIMITED REPORT   Patient Name:   Haley Trevino Date of Exam: 05/13/2022 Medical Rec #:  [id number]          Height:       62.0 in Accession #:    [id number]         Weight:       99.0 lb Date of Birth:  [birth year]          BSA:          1.418 m Patient Age:    67 years           BP:           209/70 mmHg Patient Gender: F  HR:           88 bpm. Exam Location:  Inpatient Procedure: Limited Echo, Color Doppler and Cardiac Doppler Indications:     Aortic Stenosis i35.0  History:         Patient has prior history of Echocardiogram examinations, most                  recent 03/20/2022. Risk Factors:Hypertension and Dyslipidemia.  Sonographer:     Raquel Sarna Senior RDCS Referring Phys:  [place]: Sanda Klein MD IMPRESSIONS  1.  Left ventricular ejection fraction, by estimation, is 70 to 75%. The left ventricle has hyperdynamic function. The left ventricle has no regional wall motion abnormalities. There is mild concentric left ventricular hypertrophy.  2. Right ventricular systolic function is normal. The right ventricular size is normal. There is moderately elevated pulmonary artery systolic pressure. The estimated right ventricular systolic pressure is 17.4 mmHg.  3. Left atrial size was severely dilated.  4. The mitral valve is degenerative. Trivial mitral valve regurgitation. Mild to moderate mitral stenosis. The mean mitral valve gradient is 9.9 mmHg with average heart rate of 85 bpm. Severe mitral annular calcification.  5. There is severe calcifcation of the aortic valve. There is severe thickening of the aortic valve. Severe aortic valve stenosis.  6. The inferior vena cava is normal in size with greater than 50% respiratory variability, suggesting right atrial pressure of 3 mmHg. Comparison(s): Prior images reviewed side by side. PA pressure is higher. Otherwise little change. FINDINGS  Left Ventricle: Left ventricular ejection fraction, by estimation, is 70 to 75%. The left ventricle has hyperdynamic function. The left ventricle has no regional wall motion abnormalities. There is mild concentric left ventricular hypertrophy. Right Ventricle: The right ventricular size is normal. Right vetricular wall thickness was not well visualized. Right ventricular systolic function is normal. There is moderately elevated pulmonary artery systolic pressure. The tricuspid regurgitant velocity is 3.54 m/s, and with an assumed right atrial pressure of 3 mmHg, the estimated right ventricular systolic pressure is 94.4 mmHg. Left Atrium: Left atrial size was severely dilated. Right Atrium: Right atrial size was normal in size. Mitral Valve: The mitral valve is degenerative in appearance. Severe mitral annular calcification. Trivial mitral valve  regurgitation. Mild to moderate mitral valve stenosis. MV peak gradient, 20.8 mmHg. The mean mitral valve gradient is 9.9 mmHg with average heart rate of 85 bpm. Tricuspid Valve: The tricuspid valve is normal in structure. Tricuspid valve regurgitation is mild. Aortic Valve: There is severe calcifcation of the aortic valve. There is severe thickening of the aortic valve. Severe aortic stenosis is present. Aortic valve mean gradient measures 39.4 mmHg. Aortic valve peak gradient measures 72.6 mmHg. Aortic valve area, by VTI measures 0.93 cm. Pulmonic Valve: The pulmonic valve was grossly normal. Pulmonic valve regurgitation is not visualized. Venous: The inferior vena cava is normal in size with greater than 50% respiratory variability, suggesting right atrial pressure of 3 mmHg. IAS/Shunts: The interatrial septum was not well visualized. Additional Comments: Spectral Doppler performed. Color Doppler performed.  LEFT VENTRICLE PLAX 2D LVOT diam:     1.90 cm LV SV:         82 LV SV Index:   58 LVOT Area:     2.84 cm  AORTIC VALVE AV Area (Vmax):    0.92 cm AV Area (Vmean):   0.89 cm AV Area (VTI):     0.93 cm AV Vmax:  426.02 cm/s AV Vmean:          290.130 cm/s AV VTI:            0.879 m AV Peak Grad:      72.6 mmHg AV Mean Grad:      39.4 mmHg LVOT Vmax:         138.98 cm/s LVOT Vmean:        91.347 cm/s LVOT VTI:          0.289 m LVOT/AV VTI ratio: 0.33 MITRAL VALVE              TRICUSPID VALVE MV Area (PHT): 2.29 cm   TR Peak grad:   50.1 mmHg MV Area VTI:   1.59 cm   TR Vmax:        354.00 cm/s MV Peak grad:  20.8 mmHg MV Mean grad:  9.9 mmHg   SHUNTS MV Vmax:       2.28 m/s   Systemic VTI:  0.29 m MV VTI:        0.52 m     Systemic Diam: 1.90 cm MV Decel Time: 331 msec Mihai Croitoru MD Electronically signed by Sanda Klein MD Signature Date/Time: 05/13/2022/10:30:51 AM    Final    Structural Heart Procedure  Result Date: 05/13/2022 See surgical note for result.   EKG: today shows V pacing  at 60 with CHB/NSR underlying (personally reviewed)  Baseline EKG 11/6/20203 showed NSR 76, PR interval 142, QRS 78  TELEMETRY: V paced 60 with underlying NSR/CHB (personally reviewed)  Assessment/Plan: 1.  CHB s/p TAVR Pacer dependent down to 35 bpm Threshold at least 0.8 V.  Discussed with Dr. Ali Lowe, with no baseline conduction and Sapien TAVR, reasonable for a period of watchful waiting prior to pacing decision. Will discuss with MD.   2. Severe AS s/p TVAR 11/14 Has been pacer dependent since procedure Had N/V during procedure and is being covered for possible aspiration PNA.   3. Acute hypoxic respiratory failure 4. ? Aspiration PNA On Zosyn Afebrile. WBC 18.8 (post op) Hypoxia on initial presentation 11/7 felt to be 2/2 diastolic CHF Limited echo 11/7 LVEF 70-75%, severe LAE, moderately elevated PASP  PVR 1.92 woods units on cath 03/2022, wedge was 27  For questions or updates, please contact [place] Please consult [email or web address] for contact info under Cardiology/STEMI.  Jacalyn Lefevre, PA-C  05/21/2022 9:15 AM  EP Attending  Patient seen and examined. Agree with above. The patient is referred after TAVR complicated by CHB. She has no escape. She is currently PM dependent. Hopefully her conduction will improve. We will avoid AV nodal blocking drugs. Her exam demonstrates a frail appearing woman NAD.  Lungs are clear. CV reveals a RRR. Ext are warm. Tele demonstrates ventricular pacing.  Carleene Overlie Avett Reineck,MD

## 2022-05-21 NOTE — Progress Notes (Addendum)
[facility] VALVE TEAM  Patient Name: Haley Trevino Date of Encounter: 05/21/2022  Admit date: 05/13/2022  Primary Care Provider: [name], [name] Cardiologist: [name], MD  / Dr. Ali Lowe & Dr. Cyndia Bent (TAVR)  [facility] Electrophysiologist:  None   Hospital Problem List     Principal Problem:   S/P TAVR (transcatheter aortic valve replacement) Active Problems:   [name]   Hypertension   Hyperlipidemia with target LDL less than 100   Aortic stenosis   Acute on chronic diastolic heart failure (HCC)   Acute respiratory distress   Anxiety   [name]   Hyperkalemia     Subjective   Drowsy. Still hasn't urinated on own. Nausea resolved. Daughter and husband at bedside.   Inpatient Medications    Scheduled Meds:  acetaminophen  1,000 mg Oral QHS   aspirin  81 mg Oral Daily   Chlorhexidine Gluconate Cloth  6 each Topical Daily   cycloSPORINE  1 drop Both Eyes BID   ezetimibe  10 mg Oral Daily   FLUoxetine  40 mg Oral Daily   levothyroxine  75 mcg Oral Q0600   pantoprazole  40 mg Oral Daily   sildenafil  20 mg Oral BID   sodium chloride flush  3 mL Intravenous Q12H   Continuous Infusions:  sodium chloride     clevidipine Stopped (05/21/22 0542)   nitroGLYCERIN Stopped (05/21/22 0544)   piperacillin-tazobactam (ZOSYN)  IV 3.375 g (05/21/22 0512)   PRN Meds: sodium chloride, acetaminophen **OR** acetaminophen, clonazePAM, morphine injection, ondansetron (ZOFRAN) IV, oxyCODONE, sodium chloride flush, traMADol   Vital Signs    Vitals:   05/21/22 0545 05/21/22 0600 05/21/22 0615 05/21/22 0630  BP: (!) 98/43 (!) 109/50  (!) 121/53  Pulse: 60 60 60 60  Resp: _0 Temp:      TempSrc:      SpO2: 94% 96% 96% 98%  Weight:      Height:        Intake/Output Summary (Last 24 hours) at 05/21/2022 0742 Last data filed at 05/21/2022 0400 Gross per  24 hour  Intake 1606.83 ml  Output 900 ml  Net 706.83 ml   Filed Weights   05/19/22 0404 05/20/22 0516 05/21/22 0500  Weight: 43.1 kg 43.2 kg 45.1 kg    Physical Exam    GEN: Well nourished, well developed, in no acute distress. Temp pacer in right neck HEENT: Grossly normal.  Neck: Supple, no JVD, carotid bruits, or masses. Cardiac: RRR, soft flow murmur. No rubs, or gallops. No clubbing, cyanosis, edema.   Respiratory:  soft crackles at bases GI: Soft, nontender, nondistended, BS + x 4. MS: no deformity or atrophy. Skin: warm and dry, no rash. Neuro:  Strength and sensation are intact. Psych: AAOx3.  Normal affect.  Labs    CBC Recent Labs    05/20/22 0016 05/20/22 1154 05/20/22 1340 05/21/22 0329  WBC 6.1  --   --  18.8*  HGB 10.6*   < > 10.2* 10.4*  HCT 33.4*   < > 30.0* 33.3*  MCV 93.0  --   --  95.4  PLT 266  --   --  213   < > = values in this interval not displayed.   Basic Metabolic Panel Recent Labs    05/20/22 0016 05/20/22 1154 05/20/22 1157 05/20/22 1340 05/21/22 0329  NA 134* 137   < > 136 136  K 4.4 4.1   < > 3.4* 4.3  CL 100 102  --   --  104  CO2 25  --   --   --  22  GLUCOSE 103* 121*  --   --  147*  BUN 24* 17  --   --  24*  CREATININE 0.96 0.70  --   --  1.45*  CALCIUM 8.7*  --   --   --  8.7*  MG  --   --   --   --  2.0   < > = values in this interval not displayed.   Liver Function Tests No results for input(s): "AST", "ALT", "ALKPHOS", "BILITOT", "PROT", "ALBUMIN" in the last 72 hours. No results for input(s): "LIPASE", "AMYLASE" in the last 72 hours. Cardiac Enzymes No results for input(s): "CKTOTAL", "CKMB", "CKMBINDEX", "TROPONINI" in the last 72 hours. BNP Invalid input(s): "POCBNP" D-Dimer No results for input(s): "DDIMER" in the last 72 hours. Hemoglobin A1C No results for input(s): "HGBA1C" in the last 72 hours. Fasting Lipid Panel No results for input(s): "CHOL", "HDL", "LDLCALC", "TRIG", "CHOLHDL", "LDLDIRECT" in  the last 72 hours. Thyroid Function Tests No results for input(s): "TSH", "T4TOTAL", "T3FREE", "THYROIDAB" in the last 72 hours.  Invalid input(s): "FREET3"  Telemetry    Paced with CHB when turn down temp pacer - Personally Reviewed  ECG    V paced HR 60 - Personally Reviewed  Radiology    DG Chest Port 1 View  Result Date: 05/20/2022 CLINICAL DATA:  Vomiting. EXAM: PORTABLE CHEST 1 VIEW COMPARISON:  May 17, 2022. FINDINGS: Stable cardiomegaly. Mild central pulmonary vascular congestion is noted with probable minimal bilateral pulmonary edema. Status post kyphoplasty at multiple levels in the midthoracic spine. Status post transcatheter aortic valve repair. Right internal jugular pacing lead is noted with tip in expected position of right ventricle. IMPRESSION: Stable cardiomegaly with mild central pulmonary vascular congestion and possible minimal bilateral pulmonary edema. Status post transcatheter aortic valve repair. Electronically Signed   By: Marijo Conception M.D.   On: 05/20/2022 15:42   Structural Heart Procedure  Result Date: 05/20/2022 See surgical note for result.  ECHOCARDIOGRAM LIMITED  Result Date: 05/20/2022    ECHOCARDIOGRAM LIMITED REPORT   Patient Name:   Haley Trevino Date of Exam: 05/20/2022 Medical Rec #:  [id number]          Height:       62.0 in Accession #:    [id number]         Weight:       95.2 lb Date of Birth:  1940/10/05          BSA:          1.395 m Patient Age:    81 years           BP:           185/65 mmHg Patient Gender: F                  HR:           69 bpm. Exam Location:  Inpatient Procedure: Limited Echo, Cardiac Doppler and Color Doppler Indications:    Aortic valve disorder I35.9  History:        Patient has prior history of Echocardiogram examinations, most                 recent 05/13/2022. Aortic Valve Disease and Mitral Valve Disease;  Risk Factors:Hypertension and Dyslipidemia.                 Aortic Valve: 23 mm  Sapien prosthetic, stented (TAVR) valve is                 present in the aortic position. Procedure Date: 05/20/2022.  Sonographer:    Darlina Sicilian RDCS Referring Phys: [id number] [place]  1. Note patient had CHB post deployment and required temporary pacing.  2. Left ventricular ejection fraction, by estimation, is 60 to 65%. The left ventricle has normal function. There is mild left ventricular hypertrophy.  3. Right ventricular systolic function is normal. The right ventricular size is normal.  4. Left atrial size was moderately dilated.  5. Severe bulky/caseating MAC particularly posterior annulus Moderate funcitonal MS. The mitral valve is degenerative. Trivial mitral valve regurgitation. Moderate mitral stenosis. Severe mitral annular calcification.  6. Pre TAVR: calcified tri leaflet valve with restricted motion mean gradient 41 peak 65 mmHg AVA 0.89 cm2         Post TAVR : slightly deep position of 23 mm Sapien 3 valve. No PVL mean gradient 7 peak 17 mmhg with AVA 2.3 cm2. There is a 23 mm Sapien prosthetic (TAVR) valve present in the aortic position. Procedure Date: 05/20/2022. FINDINGS  Left Ventricle: Left ventricular ejection fraction, by estimation, is 60 to 65%. The left ventricle has normal function. The left ventricular internal cavity size was normal in size. There is mild left ventricular hypertrophy. Right Ventricle: The right ventricular size is normal. Right vetricular wall thickness was not assessed. Right ventricular systolic function is normal. Left Atrium: Left atrial size was moderately dilated. Pericardium: There is no evidence of pericardial effusion. Mitral Valve: Severe bulky/caseating MAC particularly posterior annulus Moderate funcitonal MS. The mitral valve is degenerative in appearance. There is severe thickening of the mitral valve leaflet(s). There is severe calcification of the mitral valve leaflet(s). Severe mitral annular calcification. Trivial mitral valve  regurgitation. Moderate mitral valve stenosis. MV peak gradient, 11.3 mmHg. The mean mitral valve gradient is 4.0 mmHg. Tricuspid Valve: The tricuspid valve is not assessed. Aortic Valve: Pre TAVR: calcified tri leaflet valve with restricted motion mean gradient 41 peak 65 mmHg AVA 0.89 cm2 Post TAVR : slightly deep position of 23 mm Sapien 3 valve. No PVL mean gradient 7 peak 17 mmhg with AVA 2.3 cm2. Aortic valve mean gradient measures 41.0 mmHg. Aortic valve peak gradient measures 65.3 mmHg. Aortic valve area, by VTI measures 0.89 cm. There is a 23 mm Sapien prosthetic, stented (TAVR) valve present in the aortic position. Procedure Date: 05/20/2022. Pulmonic Valve: The pulmonic valve was not assessed. Aorta: The aortic root was not well visualized. Additional Comments: Note patient had CHB post deployment and required temporary pacing.  LEFT VENTRICLE PLAX 2D LVOT diam:     1.90 cm LV SV:         82 LV SV Index:   59 LVOT Area:     2.84 cm  AORTIC VALVE AV Area (Vmax):    0.87 cm AV Area (Vmean):   0.87 cm AV Area (VTI):     0.89 cm AV Vmax:           404.00 cm/s AV Vmean:          304.000 cm/s AV VTI:            0.927 m AV Peak Grad:      65.3 mmHg AV Mean Grad:  41.0 mmHg LVOT Vmax:         124.00 cm/s LVOT Vmean:        93.800 cm/s LVOT VTI:          0.290 m LVOT/AV VTI ratio: 0.31 MITRAL VALVE MV Area VTI:  1.54 cm   SHUNTS MV Peak grad: 11.3 mmHg  Systemic VTI:  0.29 m MV Mean grad: 4.0 mmHg   Systemic Diam: 1.90 cm MV Vmax:      1.68 m/s MV Vmean:     97.8 cm/s Jenkins Rouge MD Electronically signed by Jenkins Rouge MD Signature Date/Time: 05/20/2022/1:20:12 PM    Final     Cardiac Studies   [facility]  TAVR OPERATIVE NOTE     Date of Procedure:                05/20/2022   Preoperative Diagnosis:      Severe Aortic Stenosis    Postoperative Diagnosis:    Same    Procedure:        Transcatheter Aortic Valve Replacement - Transfemoral Approach             Edwards Sapien  3 Resilia THV (size 45m, model # 9755RLS, serial # [id number])              Co-Surgeons:                         BGaye Pollack MD and ALenna Sciara MD Anesthesiologist:                  WRoderic Palau MD   Echocardiographer:              PJenkins Rouge MD   Pre-operative Echo Findings: Severe aortic stenosis Normal left ventricular systolic function   Post-operative Echo Findings: No paravalvular leak Normal left ventricular systolic function   Left Heart Catheterization Findings: Left ventricular end-diastolic pressure of 173UKGU   ___________________  Echo 05/21/22: pending  Patient Profile     Haley SCARDINAis a 81y.o. female with a history of scleroderma and Raynaud's on chronic Revatio therapy, HLD, iron deficiency anemia managed with intermittent iron infusion, chronic diastolic CHF, moderate mitral stenosis due to MAC, and severe AS [facility] However, she was found to be hypoxic on anesthesia induction and case was aborted.  She was admitted for acute respiratory failure and acute anemia.     Assessment & Plan    Severe AS: s/p successful TAVR with a 23 mm Edwards Sapien 3 Ultra Resilia THV via the TF approach on 05/20/22. Post operative echo pending. Groin sites are stable. Started on a baby Asprin 81 mg daily.    CHB: pt had complete heart block after valve deployment. Has a IJ temp wire in place. Currently V paced with CHB underneath. EP consulted for possible pacer.    Acute respiratory failure with hypoxia: [facility] found to be hypoxic on anesthesia induction with O2 saturations in the 70's. Case aborted. Bedside echocardiogram with no significant change from prior imaging. Patient initally required HFNC and has been weaned down to RA after diuresis with IV lasix. She then developed recurrent hypoxia after valve deployment on 11/14 possibly related to aspiration  from N/V during case. Started on IV Zosyn. PCCM consulted. CXR yesterday with pulm edema. Concern for possible CREST syndrome and plan for HRCT  at some point.   Acute on chronic diastolic CHF: chest x-ray 11/7 showed worsening pulmonary edema and new trace left plural effusion. She was treated with IV lasix 28m BID. Converted to PO Lasix 473mdaily on 05/18/22. Net neg 4.3L. Weight down 5 lbs (100--> 95 lbs.)  Now with recurrent heart failure. Weight back up to 99 lbs and positive 2L. Given AKI and urinary retention and normal LVEDP during case yesterday, will hold off on any diuresis.   AKI: creat up from 0.96--> 1.45. Continue to monitor  Leukocytosis: WBC up to 18.8. Possibly related to aspiration. On IV Zosyn   Urinary retention: treated with I/O cath. Continue to monitor   Chronic iron deficiency anemia: follows closely with GI and hematology. She receives Q3 week iron infusions. Transfused 1 unit PRBC on admission. Hg stable at 10.4 today.    Anxiety: has worsening anxiety during admission and continued on home meds.    Hyperkalemia: ? Hemolysis. K has been elevated on several blood draws. Interesting as renal function is normal and not on any potassium elevating drugs. Last reading was normal at 4.3   Moderate mitral stenosis with MAC: Continue to follow with surveillance imaging.    Signed, KaAngelena FormPA-C  05/21/2022, 7:42 AM  Pager [phone] ATTESTATION:  After conducting a review of all available clinical information with the care team, interviewing the patient, and performing a physical exam, I agree with the findings and plan described in this note.   GEN: No acute distress.   HEENT:  MMM, no JVD, no scleral icterus; RIJ temp wire in place Cardiac: RRR, no murmurs, rubs, or gallops.  Respiratory: Coarse GI: Soft, nontender, non-distended  MS: No edema; No deformity. Neuro:  Nonfocal  Vasc:  +2 radial pulses  Patient is improved after empiric therapy  for aspiration.  Still remains pacer dependent with CHB underneath on check this AM.  Will obtain EP consult.  Discussed with pulm critical care; continue empiric Zosyn.  CXR without obvious infiltrates.  Monitor Cr, will consider resuming diuretic tomorrow.  ArLenna SciaraMD Pager [phone]

## 2022-05-21 NOTE — Progress Notes (Signed)
  Echocardiogram 2D Echocardiogram has been performed.  Haley Trevino 05/21/2022, 8:54 AM

## 2022-05-21 NOTE — Consult Note (Signed)
NAME:  Haley Trevino, MRN:  [id number], DOB:  [birth year], LOS: 8 ADMISSION DATE:  05/13/2022, CONSULTATION DATE:  05/20/2022 REFERRING MD: Alfredo Martinez -[facility], CHIEF COMPLAINT: Hypoxia post TAVR  History of Present Illness:  81 year old woman who underwent TAVR 11/13.  Significant perioperative hypoxia thought to be due to to pulmonary edema.  She had known aortic stenosis and was seen recently by Dr. Lavonna Monarch for worsening of her symptoms which had progressed to an NYHA class III.  She had a calculated aortic valve area of 1.1 cm and her cath did not show significant coronary artery disease.   Intra-op TAVR course c/b on-going nausea and hypoxia. PCCM asked to see post-operatively for hypoxia   Pertinent  Medical History  Scleroderma, Raynauds on Revatio,  HTN, HL, GERD, IBS, hypothyroidism, chronic diastolic HF, severe AS from MAC.   Significant Hospital Events: Including procedures, antibiotic start and stop dates in addition to other pertinent events   Admitted 11/7 for elective TAVR. Procedure delayed due to respiratory failure and pulm edema 11/14 went for TAVR.  Had sig nausea during procedure w/ increasing oxygen needs. Bradycardia/CBH s/p valve deployment requiring transvenous PM Returned to ICU post TAVR on NRB. PCCM asked to eval   Interim History / Subjective:  Dyspnea has improved. Patient has been up to chair  Objective   Blood pressure 114/80, pulse 60, temperature 98 F (36.7 C), temperature source Oral, resp. rate 16, height _0  (1.575 m), weight 45.1 kg, SpO2 100 %.        Intake/Output Summary (Last 24 hours) at 05/21/2022 0833 Last data filed at 05/21/2022 0400 Gross per 24 hour  Intake 1606.83 ml  Output 900 ml  Net 706.83 ml    Filed Weights   05/19/22 0404 05/20/22 0516 05/21/22 0500  Weight: 43.1 kg 43.2 kg 45.1 kg    Examination: General: frail 81 year old female resting in bed. No distress but in on 15 l NRB HENT: NCAT no JVD   Lungs: Crackles to mid lungs bilaterally. Mild egophony at right base.  Cardiovascular: RRR v paced at 60 - still in underlying CHB Abdomen: soft not tender  Extremities: warm no sg edema brisk CR. Small old ulcerations on fingers.  Neuro: awake and oriented   Ancillary tests personally reviewed  Creatinine 1.47 Assessment & Plan:  Acute hypoxic respiratory failure suspect pulmonary edema but also consider aspiration.  - Complete 5 days of Zosyn for aspiration  - Continue to wean O2 to keep saturation >90% - Incentive spirometry and progressive ambulation.  - Doesn't appear to need diuresis today - May need high resolution CT as suspect she may have underlying ILD as abnormal CXR at baseline and CREST.  Svr AS now S/p TAVR 11/14; intra-op course c/b Symptomatic bradycardia and HTN - NPO for possible PM this afternoon.  - Echo today.  AKI - likely from contrast load - hold diuresis.   Hypertension - Weaned off Cleviprex - Restart Nifedipine  H/o Raynaud's  Plan Cont her revatio for now   H/o hypothyroidism  Plan Cont synthroid    Best Practice (right click and "Reselect all SmartList Selections" daily)   Diet/type: NPO DVT prophylaxis: LMWH GI prophylaxis: N/A Lines: N/A Foley:  Yes, and it is still needed Code Status:  full code Last date of multidisciplinary goals of care discussion [per primary ]  Kipp Brood, MD [facility] ICU Physician [place]  Pager: [phone] Or Epic Secure Chat After hours: [phone].  05/21/2022, 9:02 AM

## 2022-05-22 ENCOUNTER — Encounter (HOSPITAL_COMMUNITY)
Admission: RE | Disposition: A | Discharge status: Home / Self Care | Payer: Self-pay | Source: Home / Self Care | Attending: Internal Medicine

## 2022-05-22 ENCOUNTER — Inpatient Hospital Stay (HOSPITAL_COMMUNITY): Payer: Medicare Other

## 2022-05-22 ENCOUNTER — Ambulatory Visit (HOSPITAL_COMMUNITY): Admit: 2022-05-22 | Payer: Medicare Other | Admitting: Cardiology

## 2022-05-22 DIAGNOSIS — Z952 Presence of prosthetic heart valve: Secondary | ICD-10-CM | POA: Diagnosis not present

## 2022-05-22 DIAGNOSIS — J9601 Acute respiratory failure with hypoxia: Secondary | ICD-10-CM | POA: Diagnosis not present

## 2022-05-22 DIAGNOSIS — I5033 Acute on chronic diastolic (congestive) heart failure: Secondary | ICD-10-CM | POA: Diagnosis not present

## 2022-05-22 LAB — CBC
HCT: 27.7 % — ABNORMAL LOW (ref 36.0–46.0)
Hemoglobin: 8.7 g/dL — ABNORMAL LOW (ref 12.0–15.0)
MCH: 30.6 pg (ref 26.0–34.0)
MCHC: 31.4 g/dL (ref 30.0–36.0)
MCV: 97.5 fL (ref 80.0–100.0)
Platelets: 124 10*3/uL — ABNORMAL LOW (ref 150–400)
RBC: 2.84 MIL/uL — ABNORMAL LOW (ref 3.87–5.11)
RDW: 16.6 % — ABNORMAL HIGH (ref 11.5–15.5)
WBC: 10.2 10*3/uL (ref 4.0–10.5)
nRBC: 0 % (ref 0.0–0.2)

## 2022-05-22 LAB — BASIC METABOLIC PANEL
Anion gap: 6 (ref 5–15)
BUN: 22 mg/dL (ref 8–23)
CO2: 21 mmol/L — ABNORMAL LOW (ref 22–32)
Calcium: 8.2 mg/dL — ABNORMAL LOW (ref 8.9–10.3)
Chloride: 106 mmol/L (ref 98–111)
Creatinine, Ser: 1.16 mg/dL — ABNORMAL HIGH (ref 0.44–1.00)
GFR, Estimated: 48 mL/min — ABNORMAL LOW (ref >60)
Glucose, Bld: 107 mg/dL — ABNORMAL HIGH (ref 70–99)
Potassium: 5.4 mmol/L — ABNORMAL HIGH (ref 3.5–5.1)
Sodium: 133 mmol/L — ABNORMAL LOW (ref 135–145)

## 2022-05-22 LAB — SURGICAL PCR SCREEN
MRSA, PCR: NEGATIVE
Staphylococcus aureus: NEGATIVE

## 2022-05-22 LAB — BRAIN NATRIURETIC PEPTIDE: B Natriuretic Peptide: 884.1 pg/mL — ABNORMAL HIGH (ref 0.0–100.0)

## 2022-05-22 SURGERY — INVASIVE LAB ABORTED CASE

## 2022-05-22 MED ORDER — SODIUM CHLORIDE 0.9 % IV SOLN
INTRAVENOUS | Status: AC
  Filled 2022-05-22: qty 2

## 2022-05-22 MED ORDER — FLUTICASONE PROPIONATE 50 MCG/ACT NA SUSP
2.0000 | Freq: Every day | NASAL | Status: DC
  Administered 2022-05-22 – 2022-05-28 (×7): 2 via NASAL
  Filled 2022-05-22: qty 16

## 2022-05-22 MED ORDER — FUROSEMIDE 10 MG/ML IJ SOLN
40.0000 mg | Freq: Once | INTRAMUSCULAR | Status: AC
  Administered 2022-05-22: 40 mg via INTRAVENOUS
  Filled 2022-05-22: qty 4

## 2022-05-22 MED ORDER — CEFAZOLIN SODIUM-DEXTROSE 2-4 GM/100ML-% IV SOLN
2.0000 g | INTRAVENOUS | Status: AC
  Administered 2022-05-22: 2 g via INTRAVENOUS

## 2022-05-22 MED ORDER — CHLORHEXIDINE GLUCONATE 4 % EX LIQD
60.0000 mL | Freq: Once | CUTANEOUS | Status: DC
  Filled 2022-05-22: qty 15

## 2022-05-22 MED ORDER — CHLORHEXIDINE GLUCONATE 4 % EX LIQD: 60.0000 mL | Freq: Once | CUTANEOUS | Status: DC

## 2022-05-22 MED ORDER — CEFAZOLIN SODIUM-DEXTROSE 2-4 GM/100ML-% IV SOLN
INTRAVENOUS | Status: AC
  Filled 2022-05-22: qty 100

## 2022-05-22 MED ORDER — SODIUM CHLORIDE 0.9 % IV SOLN: INTRAVENOUS | Status: DC

## 2022-05-22 MED ORDER — FUROSEMIDE 10 MG/ML IJ SOLN
40.0000 mg | Freq: Two times a day (BID) | INTRAMUSCULAR | Status: AC
  Administered 2022-05-22 – 2022-05-23 (×3): 40 mg via INTRAVENOUS
  Filled 2022-05-22 (×2): qty 4

## 2022-05-22 MED ORDER — SODIUM CHLORIDE 0.9 % IV SOLN
80.0000 mg | INTRAVENOUS | Status: DC
  Filled 2022-05-22: qty 2

## 2022-05-22 MED ORDER — FUROSEMIDE 10 MG/ML IJ SOLN
40.0000 mg | Freq: Once | INTRAMUSCULAR | Status: DC
  Filled 2022-05-22: qty 4

## 2022-05-22 MED ORDER — SALINE SPRAY 0.65 % NA SOLN: 1.0000 | NASAL | Status: DC | PRN

## 2022-05-22 MED ORDER — LIDOCAINE HCL (PF) 1 % IJ SOLN
INTRAMUSCULAR | Status: AC
  Filled 2022-05-22: qty 60

## 2022-05-22 MED ORDER — FUROSEMIDE 10 MG/ML IJ SOLN: 40.0000 mg | Freq: Once | INTRAMUSCULAR | Status: DC

## 2022-05-22 MED ORDER — SODIUM CHLORIDE 0.9 % IV SOLN: INTRAVENOUS | Status: DC | PRN

## 2022-05-22 SURGICAL SUPPLY — 5 items
CABLE SURGICAL S-101-97-12 (CABLE) ×3 IMPLANT
PAD DEFIB RADIO PHYSIO CONN (PAD) ×3 IMPLANT
SHEATH 7FR PRELUDE SNAP 13 (SHEATH) ×1 IMPLANT
SHEATH 8FR PRELUDE SNAP 13 (SHEATH) ×1 IMPLANT
TRAY PACEMAKER INSERTION (PACKS) ×3 IMPLANT

## 2022-05-22 NOTE — Progress Notes (Addendum)
[facility]VE TEAM  Patient Name: VOULA WALN Date of Encounter: 05/22/2022  Primary Cardiologist:  [name], MD  / Dr. Ali Lowe & Dr. Cyndia Bent (TAVR)   Electrophysiologist: Cristopher Peru, MD  Hospital Problem List     Principal Problem:   S/P TAVR (transcatheter aortic valve replacement) Active Problems:   [name]   Hypertension   Hyperlipidemia with target LDL less than 100   Aortic stenosis   Acute on chronic diastolic heart failure (HCC)   Acute respiratory distress   Anxiety   [name]   Hyperkalemia     Subjective   Sitting up in bed. States she didn't sleep well and her nose is burning from the oxygen. Otherwise doing ok  Inpatient Medications    Scheduled Meds:  acetaminophen  1,000 mg Oral QHS   aspirin  81 mg Oral Daily   Chlorhexidine Gluconate Cloth  6 each Topical Daily   cycloSPORINE  1 drop Both Eyes BID   ezetimibe  10 mg Oral Daily   FLUoxetine  40 mg Oral Daily   levothyroxine  75 mcg Oral Q0600   NIFEdipine  60 mg Oral Daily   pantoprazole  40 mg Oral Daily   sildenafil  20 mg Oral BID   sodium chloride flush  3 mL Intravenous Q12H   Continuous Infusions:  sodium chloride Stopped (05/22/22 0626)   sodium chloride     piperacillin-tazobactam (ZOSYN)  IV 12.5 mL/hr at 05/22/22 0700   PRN Meds: sodium chloride, sodium chloride, acetaminophen **OR** acetaminophen, clonazePAM, morphine injection, ondansetron (ZOFRAN) IV, oxyCODONE, sodium chloride, sodium chloride flush, traMADol   Vital Signs    Vitals:   05/22/22 0400 05/22/22 0500 05/22/22 0600 05/22/22 0700  BP: (!) 127/53 (!) 123/52 (!) 104/50   Pulse: 60 60 60 60  Resp: _0 Temp: 98.3 F (36.8 C)     TempSrc: Oral     SpO2: 91% 93% 92% 94%  Weight:  45 kg    Height:   _1  (1.575 m)     Intake/Output Summary (Last 24 hours) at 05/22/2022 0813 Last data filed at 05/22/2022  0700 Gross per 24 hour  Intake 992.33 ml  Output 290 ml  Net 702.33 ml   Filed Weights   05/20/22 0516 05/21/22 0500 05/22/22 0500  Weight: 43.2 kg 45.1 kg 45 kg    Physical Exam   GEN: Well nourished, well developed, in no acute distress. Temp pacer in right neck HEENT: Grossly normal.  Neck: Supple, no JVD, or masses. Cardiac: V paced at 60, soft flow murmur, No rubs, or gallops. No clubbing, cyanosis, edema.   Respiratory:  Respirations regular and unlabored, crackles at bilateral bases to mid lung Skin: warm and dry, no rash. Extremities: no edema Psych: AAOx3.  Normal affect.  Labs    CBC Recent Labs    05/21/22 0329 05/22/22 0453  WBC 18.8* 10.2  HGB 10.4* 8.7*  HCT 33.3* 27.7*  MCV 95.4 97.5  PLT 213 643*   Basic Metabolic Panel Recent Labs    05/21/22 0329 05/22/22 0453  NA 136 133*  K 4.3 5.4*  CL 104 106  CO2 22 21*  GLUCOSE 147* 107*  BUN 24* 22  CREATININE 1.45* 1.16*  CALCIUM 8.7* 8.2*  MG 2.0  --     Telemetry    05/21/22 V paced at 60 - Personally Reviewed  ECG  05/21/2022 V paced HR 60 - Personally Reviewed  Radiology    ECHOCARDIOGRAM COMPLETE  Result Date: 05/21/2022    ECHOCARDIOGRAM REPORT   Patient Name:   Akya Fiorello Date of Exam: 05/21/2022 Medical Rec #:  [id number]        Height:       62.0 in Accession #:    [id number]       Weight:       99.4 lb Date of Birth:  1941/07/06        BSA:          1.420 m Patient Age:    25 years         BP:           114/80 mmHg Patient Gender: F                HR:           60 bpm. Exam Location:  Inpatient Procedure: 2D Echo Indications:    Post TAVR evaluation  History:        Patient has prior history of Echocardiogram examinations, most                 recent 05/20/2022. Mitral Valve Disease; Risk                 Factors:Hypertension and Dyslipidemia. 23 mm Sapien prosthetic,                 stented (TAVR)                 valve is present in the aortic position. Procedure Date:                  05/20/2022.  Sonographer:    Clayton Lefort RDCS (AE) Referring Phys: Eileen Stanford IMPRESSIONS  1. Left ventricular ejection fraction, by estimation, is 60 to 65%. The left ventricle has normal function. The left ventricle has no regional wall motion abnormalities. There is moderate left ventricular hypertrophy. Left ventricular diastolic parameters are indeterminate.  2. Right ventricular systolic function is normal. The right ventricular size is normal. There is normal pulmonary artery systolic pressure.  3. Left atrial size was mildly dilated.  4. Moderate functional MS due to MAC. The mitral valve is degenerative. Trivial mitral valve regurgitation. Moderate mitral stenosis. Severe mitral annular calcification.  5. Tricuspid valve regurgitation is moderate.  6. Post TAVR with 23 mm Sapien 3 valve implant 05/20/22 no PVL mean gradient 11.5 mm peak 24.8 mm DVI 0.74 AVA 2.5 cm2 Trivial central AR . The aortic valve has been repaired/replaced. Aortic valve regurgitation is trivial. No aortic stenosis is present.  7. The inferior vena cava is normal in size with greater than 50% respiratory variability, suggesting right atrial pressure of 3 mmHg. FINDINGS  Left Ventricle: Left ventricular ejection fraction, by estimation, is 60 to 65%. The left ventricle has normal function. The left ventricle has no regional wall motion abnormalities. The left ventricular internal cavity size was normal in size. There is  moderate left ventricular hypertrophy. Left ventricular diastolic parameters are indeterminate. Right Ventricle: The right ventricular size is normal. No increase in right ventricular wall thickness. Right ventricular systolic function is normal. There is normal pulmonary artery systolic pressure. The tricuspid regurgitant velocity is 2.76 m/s, and  with an assumed right atrial pressure of 3 mmHg, the estimated right ventricular systolic pressure is 21.3 mmHg. Left Atrium: Left atrial size was mildly  dilated. Right Atrium: Right  atrial size was normal in size. Pericardium: There is no evidence of pericardial effusion. Mitral Valve: Moderate functional MS due to MAC. The mitral valve is degenerative in appearance. There is moderate thickening of the mitral valve leaflet(s). There is moderate calcification of the mitral valve leaflet(s). Severe mitral annular calcification. Trivial mitral valve regurgitation. Moderate mitral valve stenosis. MV peak gradient, 15.4 mmHg. The mean mitral valve gradient is 7.0 mmHg. Tricuspid Valve: The tricuspid valve is normal in structure. Tricuspid valve regurgitation is moderate . No evidence of tricuspid stenosis. Aortic Valve: Post TAVR with 23 mm Sapien 3 valve implant 05/20/22 no PVL mean gradient 11.5 mm peak 24.8 mm DVI 0.74 AVA 2.5 cm2 Trivial central AR. The aortic valve has been repaired/replaced. Aortic valve regurgitation is trivial. No aortic stenosis is present. Aortic valve mean gradient measures 11.5 mmHg. Aortic valve peak gradient measures 24.8 mmHg. Aortic valve area, by VTI measures 2.57 cm. Pulmonic Valve: The pulmonic valve was normal in structure. Pulmonic valve regurgitation is trivial. No evidence of pulmonic stenosis. Aorta: The aortic root is normal in size and structure. Venous: The inferior vena cava is normal in size with greater than 50% respiratory variability, suggesting right atrial pressure of 3 mmHg. IAS/Shunts: No atrial level shunt detected by color flow Doppler.  LEFT VENTRICLE PLAX 2D LVIDd:         3.40 cm LVIDs:         2.20 cm LV PW:         1.30 cm LV IVS:        1.60 cm LVOT diam:     2.10 cm LV SV:         119 LV SV Index:   84 LVOT Area:     3.46 cm  RIGHT VENTRICLE             IVC RV Basal diam:  2.90 cm     IVC diam: 1.50 cm RV S prime:     13.20 cm/s TAPSE (M-mode): 2.1 cm LEFT ATRIUM             Index        RIGHT ATRIUM           Index LA diam:        3.60 cm 2.53 cm/m   RA Area:     17.60 cm LA Vol (A2C):   43.7 ml 30.77  ml/m  RA Volume:   43.30 ml  30.48 ml/m LA Vol (A4C):   97.9 ml 68.92 ml/m LA Biplane Vol: 66.9 ml 47.10 ml/m  AORTIC VALVE AV Area (Vmax):    2.25 cm AV Area (Vmean):   2.26 cm AV Area (VTI):     2.57 cm AV Vmax:           249.00 cm/s AV Vmean:          153.000 cm/s AV VTI:            0.464 m AV Peak Grad:      24.8 mmHg AV Mean Grad:      11.5 mmHg LVOT Vmax:         162.00 cm/s LVOT Vmean:        100.000 cm/s LVOT VTI:          0.344 m LVOT/AV VTI ratio: 0.74  AORTA Ao Root diam: 2.40 cm MITRAL VALVE              TRICUSPID VALVE MV Area VTI:  1.49 cm  TR Peak grad:   30.5 mmHg MV Peak grad: 15.4 mmHg   TR Vmax:        276.00 cm/s MV Mean grad: 7.0 mmHg MV Vmax:      1.96 m/s    SHUNTS MV Vmean:     126.0 cm/s  Systemic VTI:  0.34 m                           Systemic Diam: 2.10 cm Jenkins Rouge MD Electronically signed by Jenkins Rouge MD Signature Date/Time: 05/21/2022/11:47:08 AM    Final    DG Chest Port 1 View  Result Date: 05/20/2022 CLINICAL DATA:  Vomiting. EXAM: PORTABLE CHEST 1 VIEW COMPARISON:  May 17, 2022. FINDINGS: Stable cardiomegaly. Mild central pulmonary vascular congestion is noted with probable minimal bilateral pulmonary edema. Status post kyphoplasty at multiple levels in the midthoracic spine. Status post transcatheter aortic valve repair. Right internal jugular pacing lead is noted with tip in expected position of right ventricle. IMPRESSION: Stable cardiomegaly with mild central pulmonary vascular congestion and possible minimal bilateral pulmonary edema. Status post transcatheter aortic valve repair. Electronically Signed   By: Marijo Conception M.D.   On: 05/20/2022 15:42   Structural Heart Procedure  Result Date: 05/20/2022 See surgical note for result.  ECHOCARDIOGRAM LIMITED  Result Date: 05/20/2022    ECHOCARDIOGRAM LIMITED REPORT   Patient Name:   RHEA KAELIN Date of Exam: 05/20/2022 Medical Rec #:  [id number]          Height:       62.0 in Accession  #:    [id number]         Weight:       95.2 lb Date of Birth:  [birth year]          BSA:          1.395 m Patient Age:    66 years           BP:           185/65 mmHg Patient Gender: F                  HR:           69 bpm. Exam Location:  Inpatient Procedure: Limited Echo, Cardiac Doppler and Color Doppler Indications:    Aortic valve disorder I35.9  History:        Patient has prior history of Echocardiogram examinations, most                 recent 05/13/2022. Aortic Valve Disease and Mitral Valve Disease;                 Risk Factors:Hypertension and Dyslipidemia.                 Aortic Valve: 23 mm Sapien prosthetic, stented (TAVR) valve is                 present in the aortic position. Procedure Date: 05/20/2022.  Sonographer:    Darlina Sicilian RDCS Referring Phys: [id number] Melba  1. Note patient had CHB post deployment and required temporary pacing.  2. Left ventricular ejection fraction, by estimation, is 60 to 65%. The left ventricle has normal function. There is mild left ventricular hypertrophy.  3. Right ventricular systolic function is normal. The right ventricular size is normal.  4. Left atrial size was moderately dilated.  5. Severe bulky/caseating MAC particularly  posterior annulus Moderate funcitonal MS. The mitral valve is degenerative. Trivial mitral valve regurgitation. Moderate mitral stenosis. Severe mitral annular calcification.  6. Pre TAVR: calcified tri leaflet valve with restricted motion mean gradient 41 peak 65 mmHg AVA 0.89 cm2         Post TAVR : slightly deep position of 23 mm Sapien 3 valve. No PVL mean gradient 7 peak 17 mmhg with AVA 2.3 cm2. There is a 23 mm Sapien prosthetic (TAVR) valve present in the aortic position. Procedure Date: 05/20/2022. FINDINGS  Left Ventricle: Left ventricular ejection fraction, by estimation, is 60 to 65%. The left ventricle has normal function. The left ventricular internal cavity size was normal in size. There is mild left  ventricular hypertrophy. Right Ventricle: The right ventricular size is normal. Right vetricular wall thickness was not assessed. Right ventricular systolic function is normal. Left Atrium: Left atrial size was moderately dilated. Pericardium: There is no evidence of pericardial effusion. Mitral Valve: Severe bulky/caseating MAC particularly posterior annulus Moderate funcitonal MS. The mitral valve is degenerative in appearance. There is severe thickening of the mitral valve leaflet(s). There is severe calcification of the mitral valve leaflet(s). Severe mitral annular calcification. Trivial mitral valve regurgitation. Moderate mitral valve stenosis. MV peak gradient, 11.3 mmHg. The mean mitral valve gradient is 4.0 mmHg. Tricuspid Valve: The tricuspid valve is not assessed. Aortic Valve: Pre TAVR: calcified tri leaflet valve with restricted motion mean gradient 41 peak 65 mmHg AVA 0.89 cm2 Post TAVR : slightly deep position of 23 mm Sapien 3 valve. No PVL mean gradient 7 peak 17 mmhg with AVA 2.3 cm2. Aortic valve mean gradient measures 41.0 mmHg. Aortic valve peak gradient measures 65.3 mmHg. Aortic valve area, by VTI measures 0.89 cm. There is a 23 mm Sapien prosthetic, stented (TAVR) valve present in the aortic position. Procedure Date: 05/20/2022. Pulmonic Valve: The pulmonic valve was not assessed. Aorta: The aortic root was not well visualized. Additional Comments: Note patient had CHB post deployment and required temporary pacing.  LEFT VENTRICLE PLAX 2D LVOT diam:     1.90 cm LV SV:         82 LV SV Index:   59 LVOT Area:     2.84 cm  AORTIC VALVE AV Area (Vmax):    0.87 cm AV Area (Vmean):   0.87 cm AV Area (VTI):     0.89 cm AV Vmax:           404.00 cm/s AV Vmean:          304.000 cm/s AV VTI:            0.927 m AV Peak Grad:      65.3 mmHg AV Mean Grad:      41.0 mmHg LVOT Vmax:         124.00 cm/s LVOT Vmean:        93.800 cm/s LVOT VTI:          0.290 m LVOT/AV VTI ratio: 0.31 MITRAL VALVE MV  Area VTI:  1.54 cm   SHUNTS MV Peak grad: 11.3 mmHg  Systemic VTI:  0.29 m MV Mean grad: 4.0 mmHg   Systemic Diam: 1.90 cm MV Vmax:      1.68 m/s MV Vmean:     97.8 cm/s Jenkins Rouge MD Electronically signed by Jenkins Rouge MD Signature Date/Time: 05/20/2022/1:20:12 PM    Final     Cardiac Studies   [facility]  TAVR OPERATIVE NOTE  Date of Procedure:                05/20/2022   Preoperative Diagnosis:      Severe Aortic Stenosis    Postoperative Diagnosis:    Same    Procedure:        Transcatheter Aortic Valve Replacement - Transfemoral Approach             Edwards Sapien 3 Resilia THV (size 83m, model # 9755RLS, serial # [id number])              Co-Surgeons:                         BGaye Pollack MD and ALenna Sciara MD Anesthesiologist:                  WRoderic Palau MD   Echocardiographer:              PJenkins Rouge MD   Pre-operative Echo Findings: Severe aortic stenosis Normal left ventricular systolic function   Post-operative Echo Findings: No paravalvular leak Normal left ventricular systolic function   Left Heart Catheterization Findings: Left ventricular end-diastolic pressure of 176HMCN Echo - 05/21/2022 1. Left ventricular ejection fraction, by estimation, is 60 to 65%. The  left ventricle has normal function. The left ventricle has no regional  wall motion abnormalities. There is moderate left ventricular hypertrophy.  Left ventricular diastolic  parameters are indeterminate.   2. Right ventricular systolic function is normal. The right ventricular  size is normal. There is normal pulmonary artery systolic pressure.   3. Left atrial size was mildly dilated.   4. Moderate functional MS due to MAC. The mitral valve is degenerative.  Trivial mitral valve regurgitation. Moderate mitral stenosis. Severe  mitral annular calcification.   5. Tricuspid valve regurgitation is moderate.   6. Post TAVR with 23 mm Sapien 3 valve implant  05/20/22 no PVL mean  gradient 11.5 mm peak 24.8 mm DVI 0.74 AVA 2.5 cm2 Trivial central AR .  The aortic valve has been repaired/replaced. Aortic valve regurgitation is  trivial. No aortic stenosis is  present.   7. The inferior vena cava is normal in size with greater than 50%  respiratory variability, suggesting right atrial pressure of 3 mmHg.    Patient Profile     JMARILEE DITOMMASOis a 81y.o. female with a history of scleroderma and Raynaud's on chronic Revatio therapy, HLD, iron deficiency anemia managed with intermittent iron infusion, chronic diastolic CHF, moderate mitral stenosis due to MAC, and severe AS [facility] However, she was found to be hypoxic on anesthesia induction and case was aborted.  She was admitted for acute respiratory failure and acute anemia.      Assessment & Plan    Severe AS: s/p successful TAVR with a 23 mm Edwards Sapien 3 Ultra Resilia THV via the TF approach on 05/20/22. Post operative echo 05/21/22 mean gradient 11.5, peak 24.8, with trivial AR. Groin sites are stable. Started on a baby Asprin 81 mg daily.    CHB: pt had complete heart block after valve deployment. Has a IJ temp wire in place. Currently V paced with CHB underneath. Plans for pacer today or tomorrow. On clear liquids.    Acute respiratory failure with hypoxia: [facility] found to be hypoxic on anesthesia induction with O2 saturations in the  70's. Case aborted. Bedside echocardiogram with no significant change from prior imaging. Patient initally required HFNC and has been weaned down to RA after diuresis with IV lasix. She then developed recurrent hypoxia after valve deployment on 11/14 possibly related to aspiration from N/V during case. Started on IV Zosyn. PCCM consulted. CXR yesterday with pulm edema. Concern for possible CREST syndrome and plan for HRCT at some point. On 7L [place] this am.    Acute  on chronic diastolic CHF: chest x-ray 11/7 showed worsening pulmonary edema and new trace left plural effusion. She was treated with IV lasix 54m BID. Converted to PO Lasix 466mdaily on 05/18/22. Net neg 4.3L. Weight down 5 lbs (100--> 95 lbs.)  Now with recurrent heart failure. Weight back up to 99 lbs and positive 2L. Creatine down today, will give one dose IV lasix 406moday.   AKI: creat trending down 1.45>>1.16. Continue to monitor   Leukocytosis: WBC up to 18.8 05/21/22. Possibly related to aspiration. On IV Zosyn. Down to 10.2 today    Urinary retention: treated with I/O cath 05/20/22. Urinating with no issues today.    Chronic iron deficiency anemia: follows closely with GI and hematology. She receives Q3 week iron infusions. Transfused 1 unit PRBC on admission. Hg stable trending down 10.4>>8.7. Continue to monitor   Anxiety: has worsening anxiety during admission and continued on home meds.    Hyperkalemia: ? Hemolysis. K has been elevated on several blood draws. Interesting as renal function is normal and not on any potassium elevating drugs. 4.3>>5.4 this morning. Will continue to monitor and treat if it becomes consistently elevated.    Moderate mitral stenosis with MAC: Continue to follow with surveillance imaging.   Thrombocytopenia: Mild, likely reactive. Will continue to monitor.   Signed, MarHessie DibbleN  05/22/2022, 8:13 AM  Pager [phone] ATTESTATION:  After conducting a review of all available clinical information with the care team, interviewing the patient, and performing a physical exam, I agree with the findings and plan described in this note.   GEN: No acute distress.   HEENT:  MMM, RIJ swan in place, no scleral icterus Cardiac: RRR, no murmurs, rubs, or gallops.  Respiratory: Mild rales GI: Soft, nontender, non-distended  MS: No edema; No deformity. Neuro:  Nonfocal  Vasc:  +2 radial pulses  Patient still pacemaker dependent.  Discussed  with EP>>PPM today or tomorrow.  The patient is 1.4L up over two days with increasing oxygen requirement; Cr improved.  Will give lasix 40 IV x 1 today.  Discussed with PCCM.  Cont empiric Zosyn.  AruLenna SciaraD Pager [phone]

## 2022-05-22 NOTE — Progress Notes (Signed)
Electrophysiology Rounding Note  Patient Name: Haley Trevino Date of Encounter: 05/22/2022  Primary Cardiologist: [name], MD Electrophysiologist: New   Subjective   NAEO  Inpatient Medications    Scheduled Meds:  acetaminophen  1,000 mg Oral QHS   aspirin  81 mg Oral Daily   Chlorhexidine Gluconate Cloth  6 each Topical Daily   cycloSPORINE  1 drop Both Eyes BID   ezetimibe  10 mg Oral Daily   FLUoxetine  40 mg Oral Daily   levothyroxine  75 mcg Oral Q0600   NIFEdipine  60 mg Oral Daily   pantoprazole  40 mg Oral Daily   sildenafil  20 mg Oral BID   sodium chloride flush  3 mL Intravenous Q12H   Continuous Infusions:  sodium chloride Stopped (05/22/22 0626)   sodium chloride     piperacillin-tazobactam (ZOSYN)  IV 12.5 mL/hr at 05/22/22 0700   PRN Meds: sodium chloride, sodium chloride, acetaminophen **OR** acetaminophen, clonazePAM, morphine injection, ondansetron (ZOFRAN) IV, oxyCODONE, sodium chloride flush, traMADol   Vital Signs    Vitals:   05/22/22 0400 05/22/22 0500 05/22/22 0600 05/22/22 0700  BP: (!) 127/53 (!) 123/52 (!) 104/50   Pulse: 60 60 60 60  Resp: _0 Temp: 98.3 F (36.8 C)     TempSrc: Oral     SpO2: 91% 93% 92% 94%  Weight:  45 kg    Height:   _1  (1.575 m)     Intake/Output Summary (Last 24 hours) at 05/22/2022 0749 Last data filed at 05/22/2022 0700 Gross per 24 hour  Intake 1162.39 ml  Output 440 ml  Net 722.39 ml   Filed Weights   05/20/22 0516 05/21/22 0500 05/22/22 0500  Weight: 43.2 kg 45.1 kg 45 kg    Physical Exam    GEN- The patient is thin and elderly appearing, alert and oriented x 3 today.   Head- normocephalic, atraumatic Eyes-  Sclera clear, conjunctiva pink Ears- hearing intact Oropharynx- clear Neck- supple Lungs- Clear to ausculation bilaterally, normal work of breathing Heart- Regular rate and rhythm, no murmurs, rubs or gallops GI- soft, NT, ND, + BS Extremities- no clubbing  or cyanosis. No edema Skin- no rash or lesion Psych- euthymic mood, full affect Neuro- strength and sensation are intact  Labs    CBC Recent Labs    05/21/22 0329 05/22/22 0453  WBC 18.8* 10.2  HGB 10.4* 8.7*  HCT 33.3* 27.7*  MCV 95.4 97.5  PLT 213 751*   Basic Metabolic Panel Recent Labs    05/21/22 0329 05/22/22 0453  NA 136 133*  K 4.3 5.4*  CL 104 106  CO2 22 21*  GLUCOSE 147* 107*  BUN 24* 22  CREATININE 1.45* 1.16*  CALCIUM 8.7* 8.2*  MG 2.0  --    Liver Function Tests No results for input(s): "AST", "ALT", "ALKPHOS", "BILITOT", "PROT", "ALBUMIN" in the last 72 hours. No results for input(s): "LIPASE", "AMYLASE" in the last 72 hours. Cardiac Enzymes No results for input(s): "CKTOTAL", "CKMB", "CKMBINDEX", "TROPONINI" in the last 72 hours.   Telemetry    V paced 60 (personally reviewed)  Radiology    ECHOCARDIOGRAM COMPLETE  Result Date: 05/21/2022    ECHOCARDIOGRAM REPORT   Patient Name:   Haley Trevino Date of Exam: 05/21/2022 Medical Rec #:  [id number]        Height:       62.0 in Accession #:    [id number]  Weight:       99.4 lb Date of Birth:  03/13/1941        BSA:          1.420 m Patient Age:    81 years         BP:           114/80 mmHg Patient Gender: F                HR:           60 bpm. Exam Location:  Inpatient Procedure: 2D Echo Indications:    Post TAVR evaluation  History:        Patient has prior history of Echocardiogram examinations, most                 recent 05/20/2022. Mitral Valve Disease; Risk                 Factors:Hypertension and Dyslipidemia. 23 mm Sapien prosthetic,                 stented (TAVR)                 valve is present in the aortic position. Procedure Date:                 05/20/2022.  Sonographer:    Clayton Lefort RDCS (AE) Referring Phys: Eileen Stanford IMPRESSIONS  1. Left ventricular ejection fraction, by estimation, is 60 to 65%. The left ventricle has normal function. The left ventricle has no regional  wall motion abnormalities. There is moderate left ventricular hypertrophy. Left ventricular diastolic parameters are indeterminate.  2. Right ventricular systolic function is normal. The right ventricular size is normal. There is normal pulmonary artery systolic pressure.  3. Left atrial size was mildly dilated.  4. Moderate functional MS due to MAC. The mitral valve is degenerative. Trivial mitral valve regurgitation. Moderate mitral stenosis. Severe mitral annular calcification.  5. Tricuspid valve regurgitation is moderate.  6. Post TAVR with 23 mm Sapien 3 valve implant 05/20/22 no PVL mean gradient 11.5 mm peak 24.8 mm DVI 0.74 AVA 2.5 cm2 Trivial central AR . The aortic valve has been repaired/replaced. Aortic valve regurgitation is trivial. No aortic stenosis is present.  7. The inferior vena cava is normal in size with greater than 50% respiratory variability, suggesting right atrial pressure of 3 mmHg. FINDINGS  Left Ventricle: Left ventricular ejection fraction, by estimation, is 60 to 65%. The left ventricle has normal function. The left ventricle has no regional wall motion abnormalities. The left ventricular internal cavity size was normal in size. There is  moderate left ventricular hypertrophy. Left ventricular diastolic parameters are indeterminate. Right Ventricle: The right ventricular size is normal. No increase in right ventricular wall thickness. Right ventricular systolic function is normal. There is normal pulmonary artery systolic pressure. The tricuspid regurgitant velocity is 2.76 m/s, and  with an assumed right atrial pressure of 3 mmHg, the estimated right ventricular systolic pressure is 49.6 mmHg. Left Atrium: Left atrial size was mildly dilated. Right Atrium: Right atrial size was normal in size. Pericardium: There is no evidence of pericardial effusion. Mitral Valve: Moderate functional MS due to MAC. The mitral valve is degenerative in appearance. There is moderate thickening of the  mitral valve leaflet(s). There is moderate calcification of the mitral valve leaflet(s). Severe mitral annular calcification. Trivial mitral valve regurgitation. Moderate mitral valve stenosis. MV peak gradient, 15.4 mmHg. The mean mitral valve gradient  is 7.0 mmHg. Tricuspid Valve: The tricuspid valve is normal in structure. Tricuspid valve regurgitation is moderate . No evidence of tricuspid stenosis. Aortic Valve: Post TAVR with 23 mm Sapien 3 valve implant 05/20/22 no PVL mean gradient 11.5 mm peak 24.8 mm DVI 0.74 AVA 2.5 cm2 Trivial central AR. The aortic valve has been repaired/replaced. Aortic valve regurgitation is trivial. No aortic stenosis is present. Aortic valve mean gradient measures 11.5 mmHg. Aortic valve peak gradient measures 24.8 mmHg. Aortic valve area, by VTI measures 2.57 cm. Pulmonic Valve: The pulmonic valve was normal in structure. Pulmonic valve regurgitation is trivial. No evidence of pulmonic stenosis. Aorta: The aortic root is normal in size and structure. Venous: The inferior vena cava is normal in size with greater than 50% respiratory variability, suggesting right atrial pressure of 3 mmHg. IAS/Shunts: No atrial level shunt detected by color flow Doppler.  LEFT VENTRICLE PLAX 2D LVIDd:         3.40 cm LVIDs:         2.20 cm LV PW:         1.30 cm LV IVS:        1.60 cm LVOT diam:     2.10 cm LV SV:         119 LV SV Index:   84 LVOT Area:     3.46 cm  RIGHT VENTRICLE             IVC RV Basal diam:  2.90 cm     IVC diam: 1.50 cm RV S prime:     13.20 cm/s TAPSE (M-mode): 2.1 cm LEFT ATRIUM             Index        RIGHT ATRIUM           Index LA diam:        3.60 cm 2.53 cm/m   RA Area:     17.60 cm LA Vol (A2C):   43.7 ml 30.77 ml/m  RA Volume:   43.30 ml  30.48 ml/m LA Vol (A4C):   97.9 ml 68.92 ml/m LA Biplane Vol: 66.9 ml 47.10 ml/m  AORTIC VALVE AV Area (Vmax):    2.25 cm AV Area (Vmean):   2.26 cm AV Area (VTI):     2.57 cm AV Vmax:           249.00 cm/s AV Vmean:           153.000 cm/s AV VTI:            0.464 m AV Peak Grad:      24.8 mmHg AV Mean Grad:      11.5 mmHg LVOT Vmax:         162.00 cm/s LVOT Vmean:        100.000 cm/s LVOT VTI:          0.344 m LVOT/AV VTI ratio: 0.74  AORTA Ao Root diam: 2.40 cm MITRAL VALVE              TRICUSPID VALVE MV Area VTI:  1.49 cm    TR Peak grad:   30.5 mmHg MV Peak grad: 15.4 mmHg   TR Vmax:        276.00 cm/s MV Mean grad: 7.0 mmHg MV Vmax:      1.96 m/s    SHUNTS MV Vmean:     126.0 cm/s  Systemic VTI:  0.34 m  Systemic Diam: 2.10 cm Jenkins Rouge MD Electronically signed by Jenkins Rouge MD Signature Date/Time: 05/21/2022/11:47:08 AM    Final    DG Chest Port 1 View  Result Date: 05/20/2022 CLINICAL DATA:  Vomiting. EXAM: PORTABLE CHEST 1 VIEW COMPARISON:  May 17, 2022. FINDINGS: Stable cardiomegaly. Mild central pulmonary vascular congestion is noted with probable minimal bilateral pulmonary edema. Status post kyphoplasty at multiple levels in the midthoracic spine. Status post transcatheter aortic valve repair. Right internal jugular pacing lead is noted with tip in expected position of right ventricle. IMPRESSION: Stable cardiomegaly with mild central pulmonary vascular congestion and possible minimal bilateral pulmonary edema. Status post transcatheter aortic valve repair. Electronically Signed   By: Marijo Conception M.D.   On: 05/20/2022 15:42   Structural Heart Procedure  Result Date: 05/20/2022 See surgical note for result.  ECHOCARDIOGRAM LIMITED  Result Date: 05/20/2022    ECHOCARDIOGRAM LIMITED REPORT   Patient Name:   Haley Trevino Date of Exam: 05/20/2022 Medical Rec #:  [id number]          Height:       62.0 in Accession #:    [id number]         Weight:       95.2 lb Date of Birth:  [birth year]          BSA:          1.395 m Patient Age:    55 years           BP:           185/65 mmHg Patient Gender: F                  HR:           69 bpm. Exam Location:  Inpatient  Procedure: Limited Echo, Cardiac Doppler and Color Doppler Indications:    Aortic valve disorder I35.9  History:        Patient has prior history of Echocardiogram examinations, most                 recent 05/13/2022. Aortic Valve Disease and Mitral Valve Disease;                 Risk Factors:Hypertension and Dyslipidemia.                 Aortic Valve: 23 mm Sapien prosthetic, stented (TAVR) valve is                 present in the aortic position. Procedure Date: 05/20/2022.  Sonographer:    Darlina Sicilian RDCS Referring Phys: [id number] [place]  1. Note patient had CHB post deployment and required temporary pacing.  2. Left ventricular ejection fraction, by estimation, is 60 to 65%. The left ventricle has normal function. There is mild left ventricular hypertrophy.  3. Right ventricular systolic function is normal. The right ventricular size is normal.  4. Left atrial size was moderately dilated.  5. Severe bulky/caseating MAC particularly posterior annulus Moderate funcitonal MS. The mitral valve is degenerative. Trivial mitral valve regurgitation. Moderate mitral stenosis. Severe mitral annular calcification.  6. Pre TAVR: calcified tri leaflet valve with restricted motion mean gradient 41 peak 65 mmHg AVA 0.89 cm2         Post TAVR : slightly deep position of 23 mm Sapien 3 valve. No PVL mean gradient 7 peak 17 mmhg with AVA 2.3 cm2. There is a 23 mm Sapien prosthetic (TAVR) valve present  in the aortic position. Procedure Date: 05/20/2022. FINDINGS  Left Ventricle: Left ventricular ejection fraction, by estimation, is 60 to 65%. The left ventricle has normal function. The left ventricular internal cavity size was normal in size. There is mild left ventricular hypertrophy. Right Ventricle: The right ventricular size is normal. Right vetricular wall thickness was not assessed. Right ventricular systolic function is normal. Left Atrium: Left atrial size was moderately dilated. Pericardium: There is  no evidence of pericardial effusion. Mitral Valve: Severe bulky/caseating MAC particularly posterior annulus Moderate funcitonal MS. The mitral valve is degenerative in appearance. There is severe thickening of the mitral valve leaflet(s). There is severe calcification of the mitral valve leaflet(s). Severe mitral annular calcification. Trivial mitral valve regurgitation. Moderate mitral valve stenosis. MV peak gradient, 11.3 mmHg. The mean mitral valve gradient is 4.0 mmHg. Tricuspid Valve: The tricuspid valve is not assessed. Aortic Valve: Pre TAVR: calcified tri leaflet valve with restricted motion mean gradient 41 peak 65 mmHg AVA 0.89 cm2 Post TAVR : slightly deep position of 23 mm Sapien 3 valve. No PVL mean gradient 7 peak 17 mmhg with AVA 2.3 cm2. Aortic valve mean gradient measures 41.0 mmHg. Aortic valve peak gradient measures 65.3 mmHg. Aortic valve area, by VTI measures 0.89 cm. There is a 23 mm Sapien prosthetic, stented (TAVR) valve present in the aortic position. Procedure Date: 05/20/2022. Pulmonic Valve: The pulmonic valve was not assessed. Aorta: The aortic root was not well visualized. Additional Comments: Note patient had CHB post deployment and required temporary pacing.  LEFT VENTRICLE PLAX 2D LVOT diam:     1.90 cm LV SV:         82 LV SV Index:   59 LVOT Area:     2.84 cm  AORTIC VALVE AV Area (Vmax):    0.87 cm AV Area (Vmean):   0.87 cm AV Area (VTI):     0.89 cm AV Vmax:           404.00 cm/s AV Vmean:          304.000 cm/s AV VTI:            0.927 m AV Peak Grad:      65.3 mmHg AV Mean Grad:      41.0 mmHg LVOT Vmax:         124.00 cm/s LVOT Vmean:        93.800 cm/s LVOT VTI:          0.290 m LVOT/AV VTI ratio: 0.31 MITRAL VALVE MV Area VTI:  1.54 cm   SHUNTS MV Peak grad: 11.3 mmHg  Systemic VTI:  0.29 m MV Mean grad: 4.0 mmHg   Systemic Diam: 1.90 cm MV Vmax:      1.68 m/s MV Vmean:     97.8 cm/s Jenkins Rouge MD Electronically signed by Jenkins Rouge MD Signature Date/Time:  05/20/2022/1:20:12 PM    Final     Patient Profile     Haley Trevino is a 81 y.o. female with a history of Mod/Sever AS, moderate mitral stenosis, IDA, HLD, and scleroderma who is being seen today for the evaluation of CHB s/p TVAR at the request of Dr. Ali Lowe.   Assessment & Plan    1.  CHB s/p TAVR Pacer dependent down to 35 bpm.  Threshold 0.8 - 1.0 V. Did have an escape beat prior to pacing kicking back in. Will leave NPO after a clear breakfast in case we can get to her today.  2. Severe AS s/p TVAR 11/14 Has been pacer dependent since procedure Had N/V during procedure and is being covered for possible aspiration PNA as below   3. Acute hypoxic respiratory failure 4. ? Aspiration PNA On Zosyn Afebrile. WBC 10.2 Hypoxia on initial presentation 11/7 felt to be 2/2 diastolic CHF Limited echo 11/7 LVEF 70-75%, severe LAE, moderately elevated PASP  PVR 1.92 woods units on cath 03/2022, wedge was 27  For questions or updates, please contact [place] Please consult [email or web address] for contact info under Cardiology/STEMI.  Signed, Shirley Friar, PA-C  05/22/2022, 7:49 AM

## 2022-05-22 NOTE — Progress Notes (Signed)
   NAME:  Haley Trevino, MRN:  [id number], DOB:  01-19-1941, LOS: 9 ADMISSION DATE:  05/13/2022, CONSULTATION DATE:  05/20/2022 REFERRING MD: Alfredo Martinez -[facility], CHIEF COMPLAINT: Hypoxia post TAVR  History of Present Illness:  81 year old woman who underwent TAVR 11/13.  Significant perioperative hypoxia thought to be due to to pulmonary edema.  She had known aortic stenosis and was seen recently by Dr. Lavonna Monarch for worsening of her symptoms which had progressed to an NYHA class III.  She had a calculated aortic valve area of 1.1 cm and her cath did not show significant coronary artery disease.   Intra-op TAVR course c/b on-going nausea and hypoxia. PCCM asked to see post-operatively for hypoxia   Pertinent  Medical History  Scleroderma, Raynauds on Revatio,  HTN, HL, GERD, IBS, hypothyroidism, chronic diastolic HF, severe AS from MAC.   Significant Hospital Events: Including procedures, antibiotic start and stop dates in addition to other pertinent events   Admitted 11/7 for elective TAVR. Procedure delayed due to respiratory failure and pulm edema 11/14 went for TAVR.  Had sig nausea during procedure w/ increasing oxygen needs. Bradycardia/CBH s/p valve deployment requiring transvenous PM Returned to ICU post TAVR on NRB. PCCM asked to evaluate 11/16 unable to lie supine for PPM. Given diuretic.   Interim History / Subjective:  Still dyspneic.   Objective   Blood pressure (!) 114/54, pulse 60, temperature 98.3 F (36.8 C), temperature source Oral, resp. rate 17, height _0  (1.575 m), weight 45 kg, SpO2 91 %.        Intake/Output Summary (Last 24 hours) at 05/22/2022 1253 Last data filed at 05/22/2022 1200 Gross per 24 hour  Intake 992.33 ml  Output 990 ml  Net 2.33 ml    Filed Weights   05/20/22 0516 05/21/22 0500 05/22/22 0500  Weight: 43.2 kg 45.1 kg 45 kg    Examination: General: frail 81 year old female resting in bed. No distress on 7L/min HENT: NCAT  no JVP at 4 cm with HJR Lungs: Crackles to mid lungs bilaterally. Mild egophony at right base.  Cardiovascular: RRR v paced at 60 - still in underlying CHB Abdomen: soft not tender  Extremities: warm no sg edema brisk CR. Small old ulcerations on fingers.  Neuro: awake and oriented   Ancillary tests personally reviewed  Creatinine 1.16 BNP: 884.1 WBC 10.2 CXR mild pulmonary edema and atelectasis Assessment & Plan:  Acute hypoxic respiratory failure suspect pulmonary edema but also consider aspiration.  - Complete 5 days of Zosyn for aspiration  - Continue to wean O2 to keep saturation >90% - Incentive spirometry and progressive ambulation.  - Diuresis today - May need high resolution CT as suspect she may have underlying ILD as abnormal CXR at baseline and CREST.  Svr AS now S/p TAVR 11/14; intra-op course c/b Symptomatic bradycardia and HTN - Needs further diuresis to permit pacemaker insertion.   H/o Raynaud's  - Cont her Revatio  H/o hypothyroidism  - Cont synthroid   Best Practice (right click and "Reselect all SmartList Selections" daily)   Diet/type: Regular consistency (see orders) DVT prophylaxis: LMWH GI prophylaxis: N/A Lines: N/A Foley:  Yes, and it is still needed Code Status:  full code Last date of multidisciplinary goals of care discussion [patient and family updated at the bedside ]  Kipp Brood, MD [facility] ICU Physician [place]  Pager: [phone] [place] After hours: [phone].  05/22/2022, 12:53 PM

## 2022-05-22 NOTE — Progress Notes (Addendum)
  [facility]   MULTIDISCIPLINARY HEART VALVE TEAM   Pt was scheduled for pacer this afternoon but then cancelled due to worsening hypoxia. Treated with one dose of IV lasix this AM. Will repeat dosing now. Continue HFNC and incentive spirometry.   Angelena Form PA-C  [facility]

## 2022-05-23 ENCOUNTER — Inpatient Hospital Stay (HOSPITAL_COMMUNITY): Payer: Medicare Other

## 2022-05-23 DIAGNOSIS — I4892 Unspecified atrial flutter: Secondary | ICD-10-CM

## 2022-05-23 DIAGNOSIS — I350 Nonrheumatic aortic (valve) stenosis: Secondary | ICD-10-CM | POA: Diagnosis not present

## 2022-05-23 DIAGNOSIS — I5033 Acute on chronic diastolic (congestive) heart failure: Secondary | ICD-10-CM | POA: Diagnosis not present

## 2022-05-23 DIAGNOSIS — Z952 Presence of prosthetic heart valve: Secondary | ICD-10-CM | POA: Diagnosis not present

## 2022-05-23 DIAGNOSIS — J9601 Acute respiratory failure with hypoxia: Secondary | ICD-10-CM | POA: Diagnosis not present

## 2022-05-23 LAB — BPAM RBC
Blood Product Expiration Date: 202312102359
Blood Product Expiration Date: 202312102359
Unit Type and Rh: 5100
Unit Type and Rh: 5100

## 2022-05-23 LAB — CBC WITH DIFFERENTIAL/PLATELET
Abs Immature Granulocytes: 0.03 10*3/uL (ref 0.00–0.07)
Basophils Absolute: 0.1 10*3/uL (ref 0.0–0.1)
Basophils Relative: 1 %
Eosinophils Absolute: 0.1 10*3/uL (ref 0.0–0.5)
Eosinophils Relative: 1 %
HCT: 28.9 % — ABNORMAL LOW (ref 36.0–46.0)
Hemoglobin: 8.8 g/dL — ABNORMAL LOW (ref 12.0–15.0)
Immature Granulocytes: 0 %
Lymphocytes Relative: 8 %
Lymphs Abs: 0.8 10*3/uL (ref 0.7–4.0)
MCH: 29.2 pg (ref 26.0–34.0)
MCHC: 30.4 g/dL (ref 30.0–36.0)
MCV: 96 fL (ref 80.0–100.0)
Monocytes Absolute: 0.6 10*3/uL (ref 0.1–1.0)
Monocytes Relative: 6 %
Neutro Abs: 7.9 10*3/uL — ABNORMAL HIGH (ref 1.7–7.7)
Neutrophils Relative %: 84 %
Platelets: 152 10*3/uL (ref 150–400)
RBC: 3.01 MIL/uL — ABNORMAL LOW (ref 3.87–5.11)
RDW: 16 % — ABNORMAL HIGH (ref 11.5–15.5)
WBC: 9.5 10*3/uL (ref 4.0–10.5)
nRBC: 0 % (ref 0.0–0.2)

## 2022-05-23 LAB — BASIC METABOLIC PANEL
Anion gap: 11 (ref 5–15)
BUN: 17 mg/dL (ref 8–23)
CO2: 23 mmol/L (ref 22–32)
Calcium: 8.3 mg/dL — ABNORMAL LOW (ref 8.9–10.3)
Chloride: 103 mmol/L (ref 98–111)
Creatinine, Ser: 1.22 mg/dL — ABNORMAL HIGH (ref 0.44–1.00)
GFR, Estimated: 45 mL/min — ABNORMAL LOW (ref >60)
Glucose, Bld: 136 mg/dL — ABNORMAL HIGH (ref 70–99)
Potassium: 3.3 mmol/L — ABNORMAL LOW (ref 3.5–5.1)
Sodium: 137 mmol/L (ref 135–145)

## 2022-05-23 LAB — TYPE AND SCREEN
ABO/RH(D): O POS
Antibody Screen: NEGATIVE
Unit division: 0
Unit division: 0

## 2022-05-23 LAB — HEPARIN LEVEL (UNFRACTIONATED): Heparin Unfractionated: 0.1 IU/mL — ABNORMAL LOW (ref 0.30–0.70)

## 2022-05-23 LAB — BRAIN NATRIURETIC PEPTIDE: B Natriuretic Peptide: 1540.5 pg/mL — ABNORMAL HIGH (ref 0.0–100.0)

## 2022-05-23 MED ORDER — POTASSIUM CHLORIDE CRYS ER 20 MEQ PO TBCR
40.0000 meq | EXTENDED_RELEASE_TABLET | Freq: Once | ORAL | Status: AC
  Administered 2022-05-23: 40 meq via ORAL
  Filled 2022-05-23: qty 2

## 2022-05-23 MED ORDER — HEPARIN (PORCINE) 25000 UT/250ML-% IV SOLN
1050.0000 [IU]/h | INTRAVENOUS | Status: DC
  Administered 2022-05-23: 650 [IU]/h via INTRAVENOUS
  Administered 2022-05-24: 950 [IU]/h via INTRAVENOUS
  Filled 2022-05-23 (×2): qty 250

## 2022-05-23 MED ORDER — ORAL CARE MOUTH RINSE: 15.0000 mL | OROMUCOSAL | Status: DC | PRN

## 2022-05-23 MED ORDER — POTASSIUM CHLORIDE CRYS ER 20 MEQ PO TBCR
40.0000 meq | EXTENDED_RELEASE_TABLET | ORAL | Status: DC
  Administered 2022-05-23: 40 meq via ORAL
  Filled 2022-05-23 (×2): qty 2

## 2022-05-23 MED ORDER — ORAL CARE MOUTH RINSE
15.0000 mL | OROMUCOSAL | Status: DC
  Administered 2022-05-23 – 2022-05-24 (×5): 15 mL via OROMUCOSAL

## 2022-05-23 MED FILL — Heparin Sodium (Porcine) Inj 1000 Unit/ML: Qty: 1000 | Status: AC

## 2022-05-23 MED FILL — Magnesium Sulfate Inj 50%: INTRAMUSCULAR | Qty: 10 | Status: AC

## 2022-05-23 MED FILL — Potassium Chloride Inj 2 mEq/ML: INTRAVENOUS | Qty: 40 | Status: AC

## 2022-05-23 NOTE — Progress Notes (Signed)
ANTICOAGULATION CONSULT NOTE - Initial Consult  Pharmacy Consult for heparin Indication:  atrial flutter  Allergies  Allergen Reactions   Bactrim [Sulfamethoxazole-Trimethoprim] Nausea And Vomiting   Dilaudid [Hydromorphone Hcl] Anaphylaxis   Dilaudid [Hydromorphone] Anaphylaxis   Acyclovir And Related     Unknown reaction   Crestor [Rosuvastatin Calcium] Other (See Comments)    Muscle pain   Gabapentin Swelling    Swelling in feet, and in legs   Aleve [Naproxen Sodium] Rash   Lyrica [Pregabalin] Other (See Comments)    Swelling in feet, and in legs    Patient Measurements: Height: '5\' 2"'$  (157.5 cm) Weight: 45.6 kg (100 lb 8.5 oz) IBW/kg (Calculated) : 50.1 Heparin Dosing Weight: 45.6 kg  Vital Signs: Temp: 98.2 F (36.8 C) (11/17 1145) Temp Source: Oral (11/17 1145) BP: 118/55 (11/17 1300) Pulse Rate: 50 (11/17 1300)  Labs: Recent Labs    05/21/22 0329 05/22/22 0453 05/23/22 0914 05/23/22 0920  HGB 10.4* 8.7* 8.8*  --   HCT 33.3* 27.7* 28.9*  --   PLT 213 124* 152  --   CREATININE 1.45* 1.16*  --  1.22*    Estimated Creatinine Clearance: 26.5 mL/min (A) (by C-G formula based on SCr of 1.22 mg/dL (H)).   Medical History: Past Medical History:  Diagnosis Date   Cataract    Depression    GERD (gastroesophageal reflux disease)    Hyperlipidemia    Hypertension    Hypothyroidism    IBS (irritable bowel syndrome)    Osteoarthritis    Raynaud disease    S/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 Ali Lowe & Bartle   Scleroderma [facility])    Thyroid disease    hypothyroidism    Medications:  No anticoagulation PTA  Assessment: Ms. Popovich is s/p TAVR on 11/14 with complete heart block. She has a temp wire in place, but was found to be newly in atrial flutter today. Has a CHADS-VASc score of 5. Hgb stable at 8.8, Plts 152. Pharmacy has been consulted for heparin while  awaiting pacemaker placement.   Goal of Therapy:  Heparin level 0.3-0.7 units/ml Monitor platelets by anticoagulation protocol: Yes   Plan:  No bolus given age and poor renal function Initiate heparin infusion at 650 units/hr Obtain HL in 8 hours and daily while on heparin Plan to stop infusion Sunday 11/19 PM for possible pacemaker on 11/20 Monitor for s/sx of bleeding, CBC, and renal function daily  Sinda Du, PharmD Candidate 05/23/2022,1:59 PM

## 2022-05-23 NOTE — Progress Notes (Signed)
ANTICOAGULATION CONSULT NOTE   Pharmacy Consult for heparin Indication:  atrial flutter  Allergies  Allergen Reactions   Bactrim [Sulfamethoxazole-Trimethoprim] Nausea And Vomiting   Dilaudid [Hydromorphone Hcl] Anaphylaxis   Dilaudid [Hydromorphone] Anaphylaxis   Acyclovir And Related     Unknown reaction   Crestor [Rosuvastatin Calcium] Other (See Comments)    Muscle pain   Gabapentin Swelling    Swelling in feet, and in legs   Aleve [Naproxen Sodium] Rash   Lyrica [Pregabalin] Other (See Comments)    Swelling in feet, and in legs    Patient Measurements: Height: '5\' 2"'$  (157.5 cm) Weight: 45.6 kg (100 lb 8.5 oz) IBW/kg (Calculated) : 50.1 Heparin Dosing Weight: 45.6 kg  Vital Signs: Temp: 98.6 F (37 C) (11/17 2000) Temp Source: Oral (11/17 2000) BP: 132/54 (11/17 2200) Pulse Rate: 50 (11/17 2200)  Labs: Recent Labs    05/21/22 0329 05/22/22 0453 05/23/22 0914 05/23/22 0920 05/23/22 2225  HGB 10.4* 8.7* 8.8*  --   --   HCT 33.3* 27.7* 28.9*  --   --   PLT 213 124* 152  --   --   HEPARINUNFRC  --   --   --   --  0.10*  CREATININE 1.45* 1.16*  --  1.22*  --      Estimated Creatinine Clearance: 26.5 mL/min (A) (by C-G formula based on SCr of 1.22 mg/dL (H)).   Medical History: Past Medical History:  Diagnosis Date   Cataract    Depression    GERD (gastroesophageal reflux disease)    Hyperlipidemia    Hypertension    Hypothyroidism    IBS (irritable bowel syndrome)    Osteoarthritis    Raynaud disease    S/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 Ali Lowe & Bartle   Scleroderma [facility])    Thyroid disease    hypothyroidism    Assessment: Ms. Bolanos is s/p TAVR on 11/14 with complete heart block. She has a temp wire in place, but was found to be newly in atrial flutter today. Has a CHADS-VASc score of 5. Hgb stable at 8.8, Plts 152. Pharmacy has been consulted for  heparin while awaiting pacemaker placement.   11/17 PM update:  Heparin level sub-therapeutic   Goal of Therapy:  Heparin level 0.3-0.7 units/ml Monitor platelets by anticoagulation protocol: Yes   Plan:  Inc heparin to 750 units/hr Re-check heparin level in 8 hours  Narda Bonds, PharmD, [place] Pharmacist Phone: [phone]

## 2022-05-23 NOTE — Progress Notes (Signed)
Pharmacy Antibiotic Note  Haley Trevino is a 81 y.o. female admitted on 05/13/2022 with aspiration pneumonia during TAVR .  Pharmacy has been consulted for Zosyn dosing.   Haley Trevino is now 3 days s/p TAVR. She has been afebrile and WBC stable. Renal function variable.   Plan:  Continue Zosyn 3.375 mg every 8 hrs through 11/19 for a total duration of 5 days.  Continue to monitor for improvement and renal function.   Height: '5\' 2"'$  (157.5 cm) Weight: 45.6 kg (100 lb 8.5 oz) IBW/kg (Calculated) : 50.1  Temp (24hrs), Avg:98.4 F (36.9 C), Min:97.9 F (36.6 C), Max:98.8 F (37.1 C)  Recent Labs  Lab 05/18/22 0015 05/19/22 0842 05/19/22 1533 05/20/22 0016 05/20/22 1154 05/21/22 0329 05/22/22 0453  WBC 6.4 5.3  --  6.1  --  18.8* 10.2  CREATININE 1.09*  --  0.91 0.96 0.70 1.45* 1.16*    Estimated Creatinine Clearance: 27.8 mL/min (A) (by C-G formula based on SCr of 1.16 mg/dL (H)).    Allergies  Allergen Reactions   Bactrim [Sulfamethoxazole-Trimethoprim] Nausea And Vomiting   Dilaudid [Hydromorphone Hcl] Anaphylaxis   Dilaudid [Hydromorphone] Anaphylaxis   Acyclovir And Related     Unknown reaction   Crestor [Rosuvastatin Calcium] Other (See Comments)    Muscle pain   Gabapentin Swelling    Swelling in feet, and in legs   Aleve [Naproxen Sodium] Rash   Lyrica [Pregabalin] Other (See Comments)    Swelling in feet, and in legs   Thank you for allowing pharmacy to be a part of this patient's care. Pharmacy will sign off at this time. Please reach out with any further questions or needs.   Sinda Du, PharmD Candidate 05/23/2022 6:42 AM

## 2022-05-23 NOTE — Progress Notes (Addendum)
[facility]VE TEAM  Patient Name: Haley Trevino Date of Encounter: 05/23/2022  Primary Cardiologist: [name], MD  / Dr. Ali Lowe & Dr. Cyndia Bent (TAVR)     Hospital Problem List     Principal Problem:   S/P TAVR (transcatheter aortic valve replacement) Active Problems:   [name]   Hypertension   Hyperlipidemia with target LDL less than 100   Aortic stenosis   Acute on chronic diastolic heart failure (HCC)   Acute respiratory distress   Anxiety   [name]   Hyperkalemia  Subjective   Sitting up in the chair. No complaints. Denies chest pain, SOB.   Inpatient Medications    Scheduled Meds:  acetaminophen  1,000 mg Oral QHS   aspirin  81 mg Oral Daily   Chlorhexidine Gluconate Cloth  6 each Topical Daily   cycloSPORINE  1 drop Both Eyes BID   ezetimibe  10 mg Oral Daily   FLUoxetine  40 mg Oral Daily   fluticasone  2 spray Each Nare Daily   furosemide  40 mg Intravenous BID   levothyroxine  75 mcg Oral Q0600   NIFEdipine  60 mg Oral Daily   pantoprazole  40 mg Oral Daily   sildenafil  20 mg Oral BID   sodium chloride flush  3 mL Intravenous Q12H   Continuous Infusions:  sodium chloride Stopped (05/23/22 0549)   sodium chloride 10 mL/hr at 05/23/22 0700   piperacillin-tazobactam (ZOSYN)  IV 12.5 mL/hr at 05/23/22 0700   PRN Meds: sodium chloride, sodium chloride, acetaminophen **OR** acetaminophen, clonazePAM, morphine injection, ondansetron (ZOFRAN) IV, oxyCODONE, sodium chloride, sodium chloride flush, traMADol   Vital Signs    Vitals:   05/23/22 0600 05/23/22 0645 05/23/22 0658 05/23/22 0700  BP:      Pulse: 60 60 60 60  Resp:      Temp:      TempSrc:      SpO2: 96% 96% 91% 91%  Weight:      Height:        Intake/Output Summary (Last 24 hours) at 05/23/2022 0827 Last data filed at 05/23/2022 0700 Gross per 24 hour  Intake 1078.23 ml  Output 2150 ml  Net  -1071.77 ml   Filed Weights   05/21/22 0500 05/22/22 0500 05/23/22 0535  Weight: 45.1 kg 45 kg 45.6 kg    Physical Exam    GEN: Well nourished, well developed, in no acute distress. Temp pacer in right neck HEENT: Grossly normal.  Neck: Supple, no JVD, or masses. Cardiac: Aflutter HR 60s converted to CHB HR 40s while in the room. soft flow murmur, No rubs, or gallops. No clubbing, cyanosis, edema.   Respiratory:  Respirations regular and unlabored, crackles at bilateral bases  Skin: warm and dry, no rash. Extremities: no edema Psych: AAOx3.  Normal affect.  Labs    CBC Recent Labs    05/21/22 0329 05/22/22 0453  WBC 18.8* 10.2  HGB 10.4* 8.7*  HCT 33.3* 27.7*  MCV 95.4 97.5  PLT 213 314*   Basic Metabolic Panel Recent Labs    05/21/22 0329 05/22/22 0453  NA 136 133*  K 4.3 5.4*  CL 104 106  CO2 22 21*  GLUCOSE 147* 107*  BUN 24* 22  CREATININE 1.45* 1.16*  CALCIUM 8.7* 8.2*  MG 2.0  --     Telemetry    A-flutter HR 60s converted to CHB HR 40s while in the  room  - Personally Reviewed  ECG    No new - Personally Reviewed  Radiology    ABORTED INVASIVE LAB PROCEDURE  Result Date: 05/23/2022 This case was aborted.  DG CHEST PORT 1 VIEW  Result Date: 05/22/2022 CLINICAL DATA:  Status post TAVR EXAM: PORTABLE CHEST 1 VIEW COMPARISON:  Chest x-ray dated May 20, 2022 FINDINGS: Cardiac and mediastinal contours are within normal limits status post TAVR. Transvenous pacing line is unchanged when compared with the prior exam. Mild bilateral interstitial opacities. Bibasilar atelectasis. No evidence of pneumothorax. IMPRESSION: Similar mild pulmonary edema and bibasilar atelectasis. Electronically Signed   By: Yetta Glassman M.D.   On: 05/22/2022 12:38   ECHOCARDIOGRAM COMPLETE  Result Date: 05/21/2022    ECHOCARDIOGRAM REPORT   Patient Name:   Haley Trevino Date of Exam: 05/21/2022 Medical Rec #:  [id number]        Height:       62.0 in Accession #:     [id number]       Weight:       99.4 lb Date of Birth:  [birth year]        BSA:          1.420 m Patient Age:    81 years         BP:           114/80 mmHg Patient Gender: F                HR:           60 bpm. Exam Location:  Inpatient Procedure: 2D Echo Indications:    Post TAVR evaluation  History:        Patient has prior history of Echocardiogram examinations, most                 recent 05/20/2022. Mitral Valve Disease; Risk                 Factors:Hypertension and Dyslipidemia. 23 mm Sapien prosthetic,                 stented (TAVR)                 valve is present in the aortic position. Procedure Date:                 05/20/2022.  Sonographer:    Clayton Lefort RDCS (AE) Referring Phys: Eileen Stanford IMPRESSIONS  1. Left ventricular ejection fraction, by estimation, is 60 to 65%. The left ventricle has normal function. The left ventricle has no regional wall motion abnormalities. There is moderate left ventricular hypertrophy. Left ventricular diastolic parameters are indeterminate.  2. Right ventricular systolic function is normal. The right ventricular size is normal. There is normal pulmonary artery systolic pressure.  3. Left atrial size was mildly dilated.  4. Moderate functional MS due to MAC. The mitral valve is degenerative. Trivial mitral valve regurgitation. Moderate mitral stenosis. Severe mitral annular calcification.  5. Tricuspid valve regurgitation is moderate.  6. Post TAVR with 23 mm Sapien 3 valve implant 05/20/22 no PVL mean gradient 11.5 mm peak 24.8 mm DVI 0.74 AVA 2.5 cm2 Trivial central AR . The aortic valve has been repaired/replaced. Aortic valve regurgitation is trivial. No aortic stenosis is present.  7. The inferior vena cava is normal in size with greater than 50% respiratory variability, suggesting right atrial pressure of 3 mmHg. FINDINGS  Left Ventricle: Left ventricular ejection fraction, by estimation, is 60 to 65%.  The left ventricle has normal function. The left  ventricle has no regional wall motion abnormalities. The left ventricular internal cavity size was normal in size. There is  moderate left ventricular hypertrophy. Left ventricular diastolic parameters are indeterminate. Right Ventricle: The right ventricular size is normal. No increase in right ventricular wall thickness. Right ventricular systolic function is normal. There is normal pulmonary artery systolic pressure. The tricuspid regurgitant velocity is 2.76 m/s, and  with an assumed right atrial pressure of 3 mmHg, the estimated right ventricular systolic pressure is 44.6 mmHg. Left Atrium: Left atrial size was mildly dilated. Right Atrium: Right atrial size was normal in size. Pericardium: There is no evidence of pericardial effusion. Mitral Valve: Moderate functional MS due to MAC. The mitral valve is degenerative in appearance. There is moderate thickening of the mitral valve leaflet(s). There is moderate calcification of the mitral valve leaflet(s). Severe mitral annular calcification. Trivial mitral valve regurgitation. Moderate mitral valve stenosis. MV peak gradient, 15.4 mmHg. The mean mitral valve gradient is 7.0 mmHg. Tricuspid Valve: The tricuspid valve is normal in structure. Tricuspid valve regurgitation is moderate . No evidence of tricuspid stenosis. Aortic Valve: Post TAVR with 23 mm Sapien 3 valve implant 05/20/22 no PVL mean gradient 11.5 mm peak 24.8 mm DVI 0.74 AVA 2.5 cm2 Trivial central AR. The aortic valve has been repaired/replaced. Aortic valve regurgitation is trivial. No aortic stenosis is present. Aortic valve mean gradient measures 11.5 mmHg. Aortic valve peak gradient measures 24.8 mmHg. Aortic valve area, by VTI measures 2.57 cm. Pulmonic Valve: The pulmonic valve was normal in structure. Pulmonic valve regurgitation is trivial. No evidence of pulmonic stenosis. Aorta: The aortic root is normal in size and structure. Venous: The inferior vena cava is normal in size with greater  than 50% respiratory variability, suggesting right atrial pressure of 3 mmHg. IAS/Shunts: No atrial level shunt detected by color flow Doppler.  LEFT VENTRICLE PLAX 2D LVIDd:         3.40 cm LVIDs:         2.20 cm LV PW:         1.30 cm LV IVS:        1.60 cm LVOT diam:     2.10 cm LV SV:         119 LV SV Index:   84 LVOT Area:     3.46 cm  RIGHT VENTRICLE             IVC RV Basal diam:  2.90 cm     IVC diam: 1.50 cm RV S prime:     13.20 cm/s TAPSE (M-mode): 2.1 cm LEFT ATRIUM             Index        RIGHT ATRIUM           Index LA diam:        3.60 cm 2.53 cm/m   RA Area:     17.60 cm LA Vol (A2C):   43.7 ml 30.77 ml/m  RA Volume:   43.30 ml  30.48 ml/m LA Vol (A4C):   97.9 ml 68.92 ml/m LA Biplane Vol: 66.9 ml 47.10 ml/m  AORTIC VALVE AV Area (Vmax):    2.25 cm AV Area (Vmean):   2.26 cm AV Area (VTI):     2.57 cm AV Vmax:           249.00 cm/s AV Vmean:          153.000 cm/s AV  VTI:            0.464 m AV Peak Grad:      24.8 mmHg AV Mean Grad:      11.5 mmHg LVOT Vmax:         162.00 cm/s LVOT Vmean:        100.000 cm/s LVOT VTI:          0.344 m LVOT/AV VTI ratio: 0.74  AORTA Ao Root diam: 2.40 cm MITRAL VALVE              TRICUSPID VALVE MV Area VTI:  1.49 cm    TR Peak grad:   30.5 mmHg MV Peak grad: 15.4 mmHg   TR Vmax:        276.00 cm/s MV Mean grad: 7.0 mmHg MV Vmax:      1.96 m/s    SHUNTS MV Vmean:     126.0 cm/s  Systemic VTI:  0.34 m                           Systemic Diam: 2.10 cm Jenkins Rouge MD Electronically signed by Jenkins Rouge MD Signature Date/Time: 05/21/2022/11:47:08 AM    Final     Cardiac Studies   [facility]  TAVR OPERATIVE NOTE     Date of Procedure:                05/20/2022   Preoperative Diagnosis:      Severe Aortic Stenosis    Postoperative Diagnosis:    Same    Procedure:        Transcatheter Aortic Valve Replacement - Transfemoral Approach             Edwards Sapien 3 Resilia THV (size 61m, model # 9755RLS, serial # [id number])               Co-Surgeons:                         BGaye Pollack MD and ALenna Sciara MD Anesthesiologist:                  WRoderic Palau MD   Echocardiographer:              PJenkins Rouge MD   Pre-operative Echo Findings: Severe aortic stenosis Normal left ventricular systolic function   Post-operative Echo Findings: No paravalvular leak Normal left ventricular systolic function   Left Heart Catheterization Findings: Left ventricular end-diastolic pressure of 143PIRJ  Echo - 05/21/2022 1. Left ventricular ejection fraction, by estimation, is 60 to 65%. The  left ventricle has normal function. The left ventricle has no regional  wall motion abnormalities. There is moderate left ventricular hypertrophy.  Left ventricular diastolic  parameters are indeterminate.   2. Right ventricular systolic function is normal. The right ventricular  size is normal. There is normal pulmonary artery systolic pressure.   3. Left atrial size was mildly dilated.   4. Moderate functional MS due to MAC. The mitral valve is degenerative.  Trivial mitral valve regurgitation. Moderate mitral stenosis. Severe  mitral annular calcification.   5. Tricuspid valve regurgitation is moderate.   6. Post TAVR with 23 mm Sapien 3 valve implant 05/20/22 no PVL mean  gradient 11.5 mm peak 24.8 mm DVI 0.74 AVA 2.5 cm2 Trivial central AR .  The aortic valve has been repaired/replaced. Aortic valve regurgitation is  trivial. No  aortic stenosis is  present.   7. The inferior vena cava is normal in size with greater than 50%  respiratory variability, suggesting right atrial pressure of 3 mmHg.   Patient Profile     Haley Trevino is a 81 y.o. female with a history of scleroderma and Raynaud's on chronic Revatio therapy, HLD, iron deficiency anemia managed with intermittent iron infusion, chronic diastolic CHF, moderate mitral stenosis due to MAC, and severe AS who presented to [facility] on [facility]/13/22 for planned TAVR.  However, she was found to be hypoxic on anesthesia induction and case was aborted.  She was admitted for acute respiratory failure and acute anemia.     Assessment & Plan    Severe AS: s/p successful TAVR with a 23 mm Edwards Sapien 3 Ultra Resilia THV via the TF approach on 05/20/22. Post operative echo 05/21/22 mean gradient 11.5, peak 24.8, with trivial AR. Groin sites are stable. Started on a baby Asprin 81 mg daily.    CHB: pt had complete heart block after valve deployment. Has a IJ temp wire in place. Currently V paced with underlying atrial flutter. PPM placement canceled 05/22/22 due to hypoxia on the table. Plan for device placement after the weekend.  Atrial Flutter: New today with rate control. At high risk for anticoagulation due to hx of anemia requiring blood transfusion. Plan to discuss timing (if any) of AC with team. Will continue to monitor for now.   Acute respiratory failure with hypoxia: Patient originally presented to [facility] for scheduled TAVR 05/13/22 found to be hypoxic on anesthesia induction with O2 saturations in the 70's. Case aborted. Bedside echocardiogram with no significant change from prior imaging. Patient initally required HFNC and has been weaned down to RA after diuresis with IV lasix. She then developed recurrent hypoxia after valve deployment on 11/14 possibly related to aspiration from N/V during case. Started on IV Zosyn. PCCM consulted. Concern for possible CREST syndrome and plan for HRCT at some point. Continues to require HFNC. Down to 6L today.  Chest X-ray this AM showed new hazy pulmonary opacity which may be atelectasis or infection.    Acute on chronic diastolic CHF: chest x-ray 11/7 showed worsening pulmonary edema and new trace left plural effusion. She was treated with IV lasix 23m BID. BNP elevated at 1540. Net neg 4.3L. Weight down 5 lbs (100--> 95 lbs.)  Now with recurrent heart failure and weight increase. Down 1.5 L from yesterday. Will continue IV  lasix and follow response closely.    AKI: creat trending down 1.45>>1.16>>1.22 today. Continue to monitor   Leukocytosis: WBC up to 18.8 05/21/22. Possibly related to aspiration. On IV Zosyn. Labs today pending.       Chronic iron deficiency anemia: follows closely with GI and hematology. She receives Q3 week iron infusions. Transfused 1 unit PRBC on admission. Hg stable trending down 10.4>>8.7. Continue to monitor   Anxiety: has worsening anxiety during admission and continued on home meds. States increased anxiety before attempted procedures.    Hyperkalemia/hypokalemia: ? Hemolysis. K has been elevated on several blood draws. Interesting as renal function is normal and not on any potassium elevating drugs. 4.3>>5.4>>3.3 today. Will continue to monitor and treat if it becomes consistently elevated. Repeat BMET later today and if remains low, will treat.   Moderate mitral stenosis with MAC: Continue to follow with surveillance imaging.    Thrombocytopenia: Mild, likely reactive. Will continue to monitor.     Signed, MHessie Dibble RN, Student Nurse  Practitioner    05/23/2022, 8:27 AM    ATTENDING ATTESTATION:  After conducting a review of all available clinical information with the care team, interviewing the patient, and performing a physical exam, I agree with the findings and plan described in this note.   GEN: No acute distress.   HEENT:  Dry MM, RIJ temp wire in place, no scleral icterus  Cardiac: RRR, no murmurs, rubs, or gallops.  Respiratory: Mild rales at bases GI: Soft, nontender, non-distended  MS: No edema; No deformity. Neuro:  Nonfocal  Vasc:  +2 radial pulses  Patient with brisk diuresis yesterday yet still requiring high flow nasal cannula.  The patient felt atrial flutter and also remains in complete heart block.  Permanent pacemaker deferred for today.  Chest x-ray with possible right lower/middle lobe infiltrate.  Her BNP is elevated to 1540.  We will continue IV  Lasix and await pulmonary recommendations.  If she does not improve in terms of her oxygenation/develops AKI, we will pursue a right heart catheterization to assess further.    Lenna Sciara, MD Pager [phone]

## 2022-05-23 NOTE — Progress Notes (Signed)
NAME:  Haley Trevino, MRN:  [id number], DOB:  06-11-1941, LOS: 20 ADMISSION DATE:  05/13/2022, CONSULTATION DATE:  05/20/2022 REFERRING MD: Alfredo Martinez -[facility], CHIEF COMPLAINT: Hypoxia post TAVR  History of Present Illness:  81 year old woman who underwent TAVR 11/13.  Significant perioperative hypoxia thought to be due to to pulmonary edema.  She had known aortic stenosis and was seen recently by Dr. Lavonna Monarch for worsening of her symptoms which had progressed to an NYHA class III.  She had a calculated aortic valve area of 1.1 cm and her cath did not show significant coronary artery disease.   Intra-op TAVR course c/b on-going nausea and hypoxia. PCCM asked to see post-operatively for hypoxia   Pertinent  Medical History  Scleroderma, Raynauds on Revatio,  HTN, HL, GERD, IBS, hypothyroidism, chronic diastolic HF, severe AS from MAC.   Significant Hospital Events: Including procedures, antibiotic start and stop dates in addition to other pertinent events   Admitted 11/7 for elective TAVR. Procedure delayed due to respiratory failure and pulm edema 11/14 went for TAVR.  Had sig nausea during procedure w/ increasing oxygen needs. Bradycardia/CBH s/p valve deployment requiring transvenous PM Returned to ICU post TAVR on NRB. PCCM asked to evaluate 11/16 unable to lie supine for PPM. Given diuretic.  11/17 remains ICU with TVP   Interim History / Subjective:   Remains critically ill. TVP in place.   Objective   Blood pressure (!) 120/52, pulse (!) 59, temperature 98.2 F (36.8 C), temperature source Oral, resp. rate 16, height _0  (1.575 m), weight 45.6 kg, SpO2 93 %.    FiO2 (%):  [40 %-100 %] 100 %   Intake/Output Summary (Last 24 hours) at 05/23/2022 1229 Last data filed at 05/23/2022 1200 Gross per 24 hour  Intake 1298.23 ml  Output 1900 ml  Net -601.77 ml   Filed Weights   05/21/22 0500 05/22/22 0500 05/23/22 0535  Weight: 45.1 kg 45 kg 45.6 kg     Examination: General: frail 81 year old female resting in bed. No distress on 7L/min HENT: NCAT no JVP at 4 cm with HJR Lungs: Crackles to mid lungs bilaterally. Mild egophony at right base.  Cardiovascular: RRR v paced at 60 - still in underlying CHB Abdomen: soft not tender  Extremities: warm no sg edema brisk CR. Small old ulcerations on fingers.  Neuro: awake and oriented   Ancillary tests personally reviewed   Scr 1.22 K 3.3  Assessment & Plan:   Acute hypoxic respiratory failure suspect pulmonary edema but also consider aspiration.  - CXR today with small amount of atelectasis  P:  complete 5 day course of abx  Weaning from O2, now down to 4L  Continue IS and flutter Mobility is important  Up in chair as much as tolerated   Severe AS now S/p TAVR 11/14;  intra-op course c/b Symptomatic bradycardia and HTN Complete Heart Block s/p TVP - continue diuresis per cardiology  - weaning from Fio2, down to 4L  - plans PMP on Monday   H/o Raynaud's  H/p scleroderma  - Cont her sildenafil  - may benefit from HRCT in future to look for underlying ILD   H/o hypothyroidism  - Cont synthroid   Hypokalemia  - replete   Best Practice (right click and "Reselect all SmartList Selections" daily)   Diet/type: Regular consistency (see orders) DVT prophylaxis: LMWH GI prophylaxis: N/A Lines: N/A Foley:  Yes, and it is still needed Code Status:  full code  Last date of multidisciplinary goals of care discussion [patient and family updated at the bedside ]   This patient is critically ill with multiple organ system failure; which, requires frequent high complexity decision making, assessment, support, evaluation, and titration of therapies. This was completed through the application of advanced monitoring technologies and extensive interpretation of multiple databases. During this encounter critical care time was devoted to patient care services described in this note for 32  minutes.   Garner Nash, DO [facility] 05/23/2022 12:29 PM

## 2022-05-23 NOTE — Progress Notes (Signed)
Electrophysiology Rounding Note  Patient Name: Haley Trevino Date of Encounter: 05/23/2022  Primary Cardiologist: [name], MD Electrophysiologist: None   Subjective   Remains orthopneic and on 8 liters of O2 via high flow [place]  Inpatient Medications    Scheduled Meds:  acetaminophen  1,000 mg Oral QHS   aspirin  81 mg Oral Daily   Chlorhexidine Gluconate Cloth  6 each Topical Daily   cycloSPORINE  1 drop Both Eyes BID   ezetimibe  10 mg Oral Daily   FLUoxetine  40 mg Oral Daily   fluticasone  2 spray Each Nare Daily   furosemide  40 mg Intravenous BID   levothyroxine  75 mcg Oral Q0600   NIFEdipine  60 mg Oral Daily   pantoprazole  40 mg Oral Daily   sildenafil  20 mg Oral BID   sodium chloride flush  3 mL Intravenous Q12H   Continuous Infusions:  sodium chloride Stopped (05/23/22 0549)   sodium chloride 10 mL/hr at 05/23/22 0700   piperacillin-tazobactam (ZOSYN)  IV 12.5 mL/hr at 05/23/22 0700   PRN Meds: sodium chloride, sodium chloride, acetaminophen **OR** acetaminophen, clonazePAM, morphine injection, ondansetron (ZOFRAN) IV, oxyCODONE, sodium chloride, sodium chloride flush, traMADol   Vital Signs    Vitals:   05/23/22 0600 05/23/22 0645 05/23/22 0658 05/23/22 0700  BP:      Pulse: 60 60 60 60  Resp:      Temp:      TempSrc:      SpO2: 96% 96% 91% 91%  Weight:      Height:        Intake/Output Summary (Last 24 hours) at 05/23/2022 0756 Last data filed at 05/23/2022 0700 Gross per 24 hour  Intake 1078.23 ml  Output 2350 ml  Net -1271.77 ml   Filed Weights   05/21/22 0500 05/22/22 0500 05/23/22 0535  Weight: 45.1 kg 45 kg 45.6 kg    Physical Exam    GEN- The patient is elderly and somewhat frail appearing, alert and oriented x 3 today.   Head- normocephalic, atraumatic Eyes-  Sclera clear, conjunctiva pink Ears- hearing intact Oropharynx- clear Neck- supple Lungs- Clear to ausculation bilaterally, normal work of  breathing Heart- Regular rate and rhythm, no murmurs, rubs or gallops GI- soft, NT, ND, + BS Extremities- no clubbing or cyanosis. No edema Skin- no rash or lesion Psych- euthymic mood, full affect Neuro- strength and sensation are intact  Labs    CBC Recent Labs    05/21/22 0329 05/22/22 0453  WBC 18.8* 10.2  HGB 10.4* 8.7*  HCT 33.3* 27.7*  MCV 95.4 97.5  PLT 213 628*   Basic Metabolic Panel Recent Labs    05/21/22 0329 05/22/22 0453  NA 136 133*  K 4.3 5.4*  CL 104 106  CO2 22 21*  GLUCOSE 147* 107*  BUN 24* 22  CREATININE 1.45* 1.16*  CALCIUM 8.7* 8.2*  MG 2.0  --    Liver Function Tests No results for input(s): "AST", "ALT", "ALKPHOS", "BILITOT", "PROT", "ALBUMIN" in the last 72 hours. No results for input(s): "LIPASE", "AMYLASE" in the last 72 hours. Cardiac Enzymes No results for input(s): "CKTOTAL", "CKMB", "CKMBINDEX", "TROPONINI" in the last 72 hours.   Telemetry    V paced 60, underlying flutter: intrinsic rate is AFL with rates in 50s (personally reviewed)  Radiology    ABORTED INVASIVE LAB PROCEDURE  Result Date: 05/23/2022 This case was aborted.  DG CHEST PORT 1 VIEW  Result Date: 05/22/2022  CLINICAL DATA:  Status post TAVR EXAM: PORTABLE CHEST 1 VIEW COMPARISON:  Chest x-ray dated May 20, 2022 FINDINGS: Cardiac and mediastinal contours are within normal limits status post TAVR. Transvenous pacing line is unchanged when compared with the prior exam. Mild bilateral interstitial opacities. Bibasilar atelectasis. No evidence of pneumothorax. IMPRESSION: Similar mild pulmonary edema and bibasilar atelectasis. Electronically Signed   By: Yetta Glassman M.D.   On: 05/22/2022 12:38   ECHOCARDIOGRAM COMPLETE  Result Date: 05/21/2022    ECHOCARDIOGRAM REPORT   Patient Name:   Haley Trevino Date of Exam: 05/21/2022 Medical Rec #:  [id number]        Height:       62.0 in Accession #:    [id number]       Weight:       99.4 lb Date of Birth:   Jun 11, 1941        BSA:          1.420 m Patient Age:    58 years         BP:           114/80 mmHg Patient Gender: F                HR:           60 bpm. Exam Location:  Inpatient Procedure: 2D Echo Indications:    Post TAVR evaluation  History:        Patient has prior history of Echocardiogram examinations, most                 recent 05/20/2022. Mitral Valve Disease; Risk                 Factors:Hypertension and Dyslipidemia. 23 mm Sapien prosthetic,                 stented (TAVR)                 valve is present in the aortic position. Procedure Date:                 05/20/2022.  Sonographer:    Clayton Lefort RDCS (AE) Referring Phys: Eileen Stanford IMPRESSIONS  1. Left ventricular ejection fraction, by estimation, is 60 to 65%. The left ventricle has normal function. The left ventricle has no regional wall motion abnormalities. There is moderate left ventricular hypertrophy. Left ventricular diastolic parameters are indeterminate.  2. Right ventricular systolic function is normal. The right ventricular size is normal. There is normal pulmonary artery systolic pressure.  3. Left atrial size was mildly dilated.  4. Moderate functional MS due to MAC. The mitral valve is degenerative. Trivial mitral valve regurgitation. Moderate mitral stenosis. Severe mitral annular calcification.  5. Tricuspid valve regurgitation is moderate.  6. Post TAVR with 23 mm Sapien 3 valve implant 05/20/22 no PVL mean gradient 11.5 mm peak 24.8 mm DVI 0.74 AVA 2.5 cm2 Trivial central AR . The aortic valve has been repaired/replaced. Aortic valve regurgitation is trivial. No aortic stenosis is present.  7. The inferior vena cava is normal in size with greater than 50% respiratory variability, suggesting right atrial pressure of 3 mmHg. FINDINGS  Left Ventricle: Left ventricular ejection fraction, by estimation, is 60 to 65%. The left ventricle has normal function. The left ventricle has no regional wall motion abnormalities. The left  ventricular internal cavity size was normal in size. There is  moderate left ventricular hypertrophy. Left ventricular diastolic parameters are indeterminate. Right Ventricle: The  right ventricular size is normal. No increase in right ventricular wall thickness. Right ventricular systolic function is normal. There is normal pulmonary artery systolic pressure. The tricuspid regurgitant velocity is 2.76 m/s, and  with an assumed right atrial pressure of 3 mmHg, the estimated right ventricular systolic pressure is 69.6 mmHg. Left Atrium: Left atrial size was mildly dilated. Right Atrium: Right atrial size was normal in size. Pericardium: There is no evidence of pericardial effusion. Mitral Valve: Moderate functional MS due to MAC. The mitral valve is degenerative in appearance. There is moderate thickening of the mitral valve leaflet(s). There is moderate calcification of the mitral valve leaflet(s). Severe mitral annular calcification. Trivial mitral valve regurgitation. Moderate mitral valve stenosis. MV peak gradient, 15.4 mmHg. The mean mitral valve gradient is 7.0 mmHg. Tricuspid Valve: The tricuspid valve is normal in structure. Tricuspid valve regurgitation is moderate . No evidence of tricuspid stenosis. Aortic Valve: Post TAVR with 23 mm Sapien 3 valve implant 05/20/22 no PVL mean gradient 11.5 mm peak 24.8 mm DVI 0.74 AVA 2.5 cm2 Trivial central AR. The aortic valve has been repaired/replaced. Aortic valve regurgitation is trivial. No aortic stenosis is present. Aortic valve mean gradient measures 11.5 mmHg. Aortic valve peak gradient measures 24.8 mmHg. Aortic valve area, by VTI measures 2.57 cm. Pulmonic Valve: The pulmonic valve was normal in structure. Pulmonic valve regurgitation is trivial. No evidence of pulmonic stenosis. Aorta: The aortic root is normal in size and structure. Venous: The inferior vena cava is normal in size with greater than 50% respiratory variability, suggesting right atrial  pressure of 3 mmHg. IAS/Shunts: No atrial level shunt detected by color flow Doppler.  LEFT VENTRICLE PLAX 2D LVIDd:         3.40 cm LVIDs:         2.20 cm LV PW:         1.30 cm LV IVS:        1.60 cm LVOT diam:     2.10 cm LV SV:         119 LV SV Index:   84 LVOT Area:     3.46 cm  RIGHT VENTRICLE             IVC RV Basal diam:  2.90 cm     IVC diam: 1.50 cm RV S prime:     13.20 cm/s TAPSE (M-mode): 2.1 cm LEFT ATRIUM             Index        RIGHT ATRIUM           Index LA diam:        3.60 cm 2.53 cm/m   RA Area:     17.60 cm LA Vol (A2C):   43.7 ml 30.77 ml/m  RA Volume:   43.30 ml  30.48 ml/m LA Vol (A4C):   97.9 ml 68.92 ml/m LA Biplane Vol: 66.9 ml 47.10 ml/m  AORTIC VALVE AV Area (Vmax):    2.25 cm AV Area (Vmean):   2.26 cm AV Area (VTI):     2.57 cm AV Vmax:           249.00 cm/s AV Vmean:          153.000 cm/s AV VTI:            0.464 m AV Peak Grad:      24.8 mmHg AV Mean Grad:      11.5 mmHg LVOT Vmax:  162.00 cm/s LVOT Vmean:        100.000 cm/s LVOT VTI:          0.344 m LVOT/AV VTI ratio: 0.74  AORTA Ao Root diam: 2.40 cm MITRAL VALVE              TRICUSPID VALVE MV Area VTI:  1.49 cm    TR Peak grad:   30.5 mmHg MV Peak grad: 15.4 mmHg   TR Vmax:        276.00 cm/s MV Mean grad: 7.0 mmHg MV Vmax:      1.96 m/s    SHUNTS MV Vmean:     126.0 cm/s  Systemic VTI:  0.34 m                           Systemic Diam: 2.10 cm Jenkins Rouge MD Electronically signed by Jenkins Rouge MD Signature Date/Time: 05/21/2022/11:47:08 AM    Final     Patient Profile     Haley Trevino is a 81 y.o. female with a history of Mod/Sever AS, moderate mitral stenosis, IDA, HLD, and scleroderma who is being seen today for the evaluation of CHB s/p TVAR at the request of Dr. Ali Lowe.    Assessment & Plan    1.  CHB s/p TAVR Now in flutter with intrinsic rates in 54s. PPM turned down to 50 and pt conducted. Suspect in sinus she would still be very slow.  Most likely will still plan for device,  after optimization through the weekend.   2. Atrial flutter New, but conducting in 37s.  Will need to discuss timing of anticoagulation with CHA2DS2VASc  of at least 5 (age x 2, female, HTN, and least mild CAD)   3. Severe AS s/p TVAR 11/14 Per primary   3. Acute hypoxic respiratory failure 4. ? Aspiration PNA On Zosyn Afebrile.  Limited echo 11/7 LVEF 70-75%, severe LAE, moderately elevated PASP  PVR 1.92 woods units on cath 03/2022, wedge was 27 Remains hypoxic, repeat CXR and likely needs continued diuresis.  BMET pending this am.   For questions or updates, please contact [place] Please consult [email or web address] for contact info under Cardiology/STEMI.  Signed, Shirley Friar, PA-C  05/23/2022, 7:56 AM

## 2022-05-24 DIAGNOSIS — Z952 Presence of prosthetic heart valve: Secondary | ICD-10-CM | POA: Diagnosis not present

## 2022-05-24 LAB — BASIC METABOLIC PANEL
Anion gap: 11 (ref 5–15)
BUN: 19 mg/dL (ref 8–23)
CO2: 23 mmol/L (ref 22–32)
Calcium: 8.5 mg/dL — ABNORMAL LOW (ref 8.9–10.3)
Chloride: 104 mmol/L (ref 98–111)
Creatinine, Ser: 1.3 mg/dL — ABNORMAL HIGH (ref 0.44–1.00)
GFR, Estimated: 42 mL/min — ABNORMAL LOW (ref >60)
Glucose, Bld: 104 mg/dL — ABNORMAL HIGH (ref 70–99)
Potassium: 4.8 mmol/L (ref 3.5–5.1)
Sodium: 138 mmol/L (ref 135–145)

## 2022-05-24 LAB — CBC
HCT: 29 % — ABNORMAL LOW (ref 36.0–46.0)
Hemoglobin: 9.4 g/dL — ABNORMAL LOW (ref 12.0–15.0)
MCH: 30.5 pg (ref 26.0–34.0)
MCHC: 32.4 g/dL (ref 30.0–36.0)
MCV: 94.2 fL (ref 80.0–100.0)
Platelets: 165 10*3/uL (ref 150–400)
RBC: 3.08 MIL/uL — ABNORMAL LOW (ref 3.87–5.11)
RDW: 16.2 % — ABNORMAL HIGH (ref 11.5–15.5)
WBC: 7.1 10*3/uL (ref 4.0–10.5)
nRBC: 0 % (ref 0.0–0.2)

## 2022-05-24 LAB — BRAIN NATRIURETIC PEPTIDE: B Natriuretic Peptide: 1698 pg/mL — ABNORMAL HIGH (ref 0.0–100.0)

## 2022-05-24 LAB — HEPARIN LEVEL (UNFRACTIONATED)
Heparin Unfractionated: 0.14 IU/mL — ABNORMAL LOW (ref 0.30–0.70)
Heparin Unfractionated: 0.16 IU/mL — ABNORMAL LOW (ref 0.30–0.70)

## 2022-05-24 MED ORDER — FUROSEMIDE 10 MG/ML IJ SOLN
40.0000 mg | Freq: Two times a day (BID) | INTRAMUSCULAR | Status: AC
  Administered 2022-05-24 – 2022-05-25 (×3): 40 mg via INTRAVENOUS
  Filled 2022-05-24 (×3): qty 4

## 2022-05-24 NOTE — Progress Notes (Signed)
ANTICOAGULATION CONSULT NOTE  Pharmacy Consult for heparin Indication:  atrial flutter  Allergies  Allergen Reactions   Bactrim [Sulfamethoxazole-Trimethoprim] Nausea And Vomiting   Dilaudid [Hydromorphone Hcl] Anaphylaxis   Dilaudid [Hydromorphone] Anaphylaxis   Acyclovir And Related     Unknown reaction   Crestor [Rosuvastatin Calcium] Other (See Comments)    Muscle pain   Gabapentin Swelling    Swelling in feet, and in legs   Aleve [Naproxen Sodium] Rash   Lyrica [Pregabalin] Other (See Comments)    Swelling in feet, and in legs    Patient Measurements: Height: '5\' 2"'$  (157.5 cm) Weight: 45.4 kg (100 lb 1.4 oz) IBW/kg (Calculated) : 50.1 Heparin Dosing Weight: 45.6 kg  Vital Signs: Temp: 98.4 F (36.9 C) (11/18 0800) Temp Source: Oral (11/18 0800) BP: 120/53 (11/18 1000) Pulse Rate: 57 (11/18 1000)  Labs: Recent Labs    05/22/22 0453 05/23/22 0914 05/23/22 0920 05/23/22 2225 05/24/22 0555  HGB 8.7* 8.8*  --   --  9.4*  HCT 27.7* 28.9*  --   --  29.0*  PLT 124* 152  --   --  165  HEPARINUNFRC  --   --   --  0.10* 0.14*  CREATININE 1.16*  --  1.22*  --  1.30*     Estimated Creatinine Clearance: 24.7 mL/min (A) (by C-G formula based on SCr of 1.3 mg/dL (H)).   Medical History: Past Medical History:  Diagnosis Date   Cataract    Depression    GERD (gastroesophageal reflux disease)    Hyperlipidemia    Hypertension    Hypothyroidism    IBS (irritable bowel syndrome)    Osteoarthritis    Raynaud disease    S/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 Ali Lowe & Bartle   Scleroderma [facility])    Thyroid disease    hypothyroidism    Medications:  No anticoagulation PTA  Assessment: Haley Trevino is s/p TAVR on 11/14 with complete heart block. She has a temp wire in place, but was found to be newly in atrial flutter today. Has a CHADS-VASc score of 5. Hgb stable at 8.8, Plts  152. Pharmacy has been consulted for heparin while awaiting pacemaker placement.   Heparin level remains subtherapeutic at 0.14, CBC stable. No infusion or bleeding issues per RN. Heparin to stop at midnight per EP on 11/19.  Goal of Therapy:  Heparin level 0.3-0.7 units/ml Monitor platelets by anticoagulation protocol: Yes   Plan:  Increase heparin to 850 units/h Repeat heparin level in 8h Plan to stop infusion Sunday 11/19 PM for possible pacemaker on 11/20  Arrie Senate, PharmD, BCPS, [facility] Clinical Pharmacist [phone] Please check AMION for all [place] numbers 05/24/2022

## 2022-05-24 NOTE — Progress Notes (Signed)
ANTICOAGULATION CONSULT NOTE  Pharmacy Consult for heparin Indication:  atrial flutter  Allergies  Allergen Reactions   Bactrim [Sulfamethoxazole-Trimethoprim] Nausea And Vomiting   Dilaudid [Hydromorphone Hcl] Anaphylaxis   Dilaudid [Hydromorphone] Anaphylaxis   Acyclovir And Related     Unknown reaction   Crestor [Rosuvastatin Calcium] Other (See Comments)    Muscle pain   Gabapentin Swelling    Swelling in feet, and in legs   Aleve [Naproxen Sodium] Rash   Lyrica [Pregabalin] Other (See Comments)    Swelling in feet, and in legs    Patient Measurements: Height: '5\' 2"'$  (157.5 cm) Weight: 45.4 kg (100 lb 1.4 oz) IBW/kg (Calculated) : 50.1 Heparin Dosing Weight: 45.6 kg  Vital Signs: Temp: 98.8 F (37.1 C) (11/18 1700) Temp Source: Oral (11/18 1200) BP: 113/46 (11/18 1500) Pulse Rate: 54 (11/18 1500)  Labs: Recent Labs    05/22/22 0453 05/23/22 0914 05/23/22 0920 05/23/22 2225 05/24/22 0555 05/24/22 1810  HGB 8.7* 8.8*  --   --  9.4*  --   HCT 27.7* 28.9*  --   --  29.0*  --   PLT 124* 152  --   --  165  --   HEPARINUNFRC  --   --   --  0.10* 0.14* 0.16*  CREATININE 1.16*  --  1.22*  --  1.30*  --      Estimated Creatinine Clearance: 24.7 mL/min (A) (by C-G formula based on SCr of 1.3 mg/dL (H)).   Medical History: Past Medical History:  Diagnosis Date   Cataract    Depression    GERD (gastroesophageal reflux disease)    Hyperlipidemia    Hypertension    Hypothyroidism    IBS (irritable bowel syndrome)    Osteoarthritis    Raynaud disease    S/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 Ali Lowe & Bartle   Scleroderma [facility])    Thyroid disease    hypothyroidism    Medications:  No anticoagulation PTA  Assessment: Ms. Heyden is s/p TAVR on 11/14 with complete heart block. She has a temp wire in place, but was found to be newly in atrial flutter today. Has a  CHADS-VASc score of 5. Hgb stable at 8.8, Plts 152. Pharmacy has been consulted for heparin while awaiting pacemaker placement.   Heparin level remains subtherapeutic at 0.16, CBC stable. No infusion or bleeding issues per RN. Heparin to stop at midnight per EP on 11/19. Heparin levels have been subtherapeutic with little increases based on dose changes. Will continue to increase the rate at the same interval despite her low weight and poor renal function based on her trended levels and close monitoring.  Goal of Therapy:  Heparin level 0.3-0.7 units/ml Monitor platelets by anticoagulation protocol: Yes   Plan:  Increase heparin to 950 units/h Repeat heparin level in 8h Plan to stop infusion Sunday 11/19 PM for possible pacemaker on 11/20  Varney Daily, PharmD PGY2 Pharmacy Resident  Please check AMION for all [facility] pharmacy phone numbers After 10:00 PM call main pharmacy [phone]

## 2022-05-24 NOTE — Progress Notes (Signed)
NAME:  Haley Trevino, MRN:  [id number], DOB:  [birth year], LOS: 41 ADMISSION DATE:  05/13/2022, CONSULTATION DATE:  05/20/2022 REFERRING MD: Alfredo Martinez -[facility], CHIEF COMPLAINT: Hypoxia post TAVR  History of Present Illness:  81 year old woman who underwent TAVR 11/13.  Significant perioperative hypoxia thought to be due to to pulmonary edema.  She had known aortic stenosis and was seen recently by Dr. Lavonna Monarch for worsening of her symptoms which had progressed to an NYHA class III.  She had a calculated aortic valve area of 1.1 cm and her cath did not show significant coronary artery disease.   Intra-op TAVR course c/b on-going nausea and hypoxia. PCCM asked to see post-operatively for hypoxia   Pertinent  Medical History  Scleroderma, Raynauds on Revatio,  HTN, HL, GERD, IBS, hypothyroidism, chronic diastolic HF, severe AS from MAC.   Significant Hospital Events: Including procedures, antibiotic start and stop dates in addition to other pertinent events   Admitted 11/7 for elective TAVR. Procedure delayed due to respiratory failure and pulm edema 11/14 went for TAVR.  Had sig nausea during procedure w/ increasing oxygen needs. Bradycardia/CBH s/p valve deployment requiring transvenous PM Returned to ICU post TAVR on NRB. PCCM asked to evaluate 11/16 unable to lie supine for PPM. Given diuretic.  11/17 remains ICU with TVP  11/18 O2 requirements are decreasing, heart rate stable.  Anticoagulation held planning for possible device placement Monday.  Interim History / Subjective:   Patient remains critically ill in the intensive care unit, TVP in place.  Objective   Blood pressure (!) 144/70, pulse (!) 57, temperature 98.4 F (36.9 C), temperature source Oral, resp. rate 16, height _0  (1.575 m), weight 45.4 kg, SpO2 90 %.        Intake/Output Summary (Last 24 hours) at 05/24/2022 0904 Last data filed at 05/24/2022 0700 Gross per 24 hour  Intake 970.23 ml  Output  1000 ml  Net -29.77 ml   Filed Weights   05/22/22 0500 05/23/22 0535 05/24/22 0500  Weight: 45 kg 45.6 kg 45.4 kg    Examination: General: Frail 81 year old female resting in bed no distress HENT: NCAT the, tracking appropriately, right IJ sheath Lungs: No crackles, clear to auscultation bilaterally Cardiovascular: Bradycardia in the 50s Abdomen: Soft nontender nondistended Extremities: No edema, small old ulcerations on fingertips Neuro: Alert oriented following commands  Ancillary tests personally reviewed   Serum creatinine 1.3, mild increase  Assessment & Plan:   Acute hypoxic respiratory failure suspect pulmonary edema but also consider aspiration.  - CXR today with small amount of atelectasis, versus small infiltrate in the right lower lobe consistent with pneumonia P: Complete 5-day course of antibiotics.  Currently on Zosyn Continue to wean FiO2 to maintain oxygen saturations greater than 90%, currently on 4 L Continue I-S flutter, early mobility Up in chair as much as tolerated.  Severe AS now S/p TAVR 11/14;  intra-op course c/b Symptomatic bradycardia and HTN Complete Heart Block s/p TVP -Plan: Continue diuresis TVP remains in place Planning for possible pacemaker placement on Monday per EP.  H/o Raynaud's  H/p scleroderma  Plan: Continue sildenafil May benefit from an HRCT in the outpatient setting to look for any underlying signs of ILD  H/o hypothyroidism  -Continue Synthroid  Hypokalemia  -Replete as needed, goal greater than 4.  Best Practice (right click and "Reselect all SmartList Selections" daily)   Diet/type: Regular consistency (see orders) DVT prophylaxis: LMWH GI prophylaxis: N/A Lines: N/A Foley:  Yes, and  it is still needed Code Status:  full code Last date of multidisciplinary goals of care discussion [patient and family updated at the bedside ]  This patient is critically ill with multiple organ system failure; which, requires  frequent high complexity decision making, assessment, support, evaluation, and titration of therapies. This was completed through the application of advanced monitoring technologies and extensive interpretation of multiple databases. During this encounter critical care time was devoted to patient care services described in this note for 32 minutes.   Garner Nash, DO [facility] 05/24/2022 9:04 AM

## 2022-05-24 NOTE — Progress Notes (Signed)
Rounding Note    Patient Name: Haley Trevino Date of Encounter: 05/24/2022  [place] Cardiologist: [name], MD   Subjective   NAEO. Still on HFNC at 6.   Inpatient Medications    Scheduled Meds:  acetaminophen  1,000 mg Oral QHS   aspirin  81 mg Oral Daily   Chlorhexidine Gluconate Cloth  6 each Topical Daily   cycloSPORINE  1 drop Both Eyes BID   ezetimibe  10 mg Oral Daily   FLUoxetine  40 mg Oral Daily   fluticasone  2 spray Each Nare Daily   levothyroxine  75 mcg Oral Q0600   NIFEdipine  60 mg Oral Daily   mouth rinse  15 mL Mouth Rinse 4 times per day   pantoprazole  40 mg Oral Daily   sildenafil  20 mg Oral BID   sodium chloride flush  3 mL Intravenous Q12H   Continuous Infusions:  sodium chloride Stopped (05/24/22 0608)   sodium chloride 10 mL/hr at 05/24/22 0700   heparin 750 Units/hr (05/24/22 0700)   piperacillin-tazobactam (ZOSYN)  IV 12.5 mL/hr at 05/24/22 0700   PRN Meds: sodium chloride, sodium chloride, acetaminophen **OR** acetaminophen, clonazePAM, morphine injection, ondansetron (ZOFRAN) IV, mouth rinse, mouth rinse, oxyCODONE, sodium chloride, sodium chloride flush, traMADol   Vital Signs    Vitals:   05/24/22 0400 05/24/22 0500 05/24/22 0600 05/24/22 0700  BP: 136/65 (!) 166/138 128/61 (!) 147/79  Pulse: (!) 50 (!) 51 (!) 50 (!) 50  Resp: 16     Temp: 97.9 F (36.6 C)     TempSrc: Oral     SpO2: 96% 93% 95% 94%  Weight:  45.4 kg    Height:        Intake/Output Summary (Last 24 hours) at 05/24/2022 0736 Last data filed at 05/24/2022 0700 Gross per 24 hour  Intake 1070.23 ml  Output 1000 ml  Net 70.23 ml      05/24/2022    5:00 AM 05/23/2022    5:35 AM 05/22/2022    5:00 AM  Last 3 Weights  Weight (lbs) 100 lb 1.4 oz 100 lb 8.5 oz 99 lb 3.3 oz  Weight (kg) 45.4 kg 45.6 kg 45 kg      Telemetry    Intermittent V pacing, AV dissociation. - Personally Reviewed  ECG    Personally  Reviewed  Physical Exam   GEN: No acute distress.  Frail. Elderly.  Neck: No JVD Cardiac: RRR, no murmurs, rubs, or gallops.  Respiratory: Clear to auscultation bilaterally. On HFNC. GI: Soft, nontender, non-distended  MS: No edema; No deformity. Neuro:  Nonfocal  Psych: Normal affect   Labs    High Sensitivity Troponin:  No results for input(s): "TROPONINIHS" in the last 720 hours.   Chemistry Recent Labs  Lab 05/21/22 0329 05/22/22 0453 05/23/22 0920 05/24/22 0555  NA 136 133* 137 138  K 4.3 5.4* 3.3* 4.8  CL 104 106 103 104  CO2 22 21* 23 23  GLUCOSE 147* 107* 136* 104*  BUN 24* '22 17 19  '$ CREATININE 1.45* 1.16* 1.22* 1.30*  CALCIUM 8.7* 8.2* 8.3* 8.5*  MG 2.0  --   --   --   GFRNONAA 36* 48* 45* 42*  ANIONGAP '10 6 11 11    '$ Lipids No results for input(s): "CHOL", "TRIG", "HDL", "LABVLDL", "LDLCALC", "CHOLHDL" in the last 168 hours.  Hematology Recent Labs  Lab 05/22/22 0453 05/23/22 0914 05/24/22 0555  WBC 10.2 9.5 7.1  RBC  2.84* 3.01* 3.08*  HGB 8.7* 8.8* 9.4*  HCT 27.7* 28.9* 29.0*  MCV 97.5 96.0 94.2  MCH 30.6 29.2 30.5  MCHC 31.4 30.4 32.4  RDW 16.6* 16.0* 16.2*  PLT 124* 152 165   Thyroid No results for input(s): "TSH", "FREET4" in the last 168 hours.  BNP Recent Labs  Lab 05/22/22 0453 05/23/22 0920 05/24/22 0555  BNP 884.1* 1,540.5* 1,698.0*    DDimer No results for input(s): "DDIMER" in the last 168 hours.   Radiology    DG CHEST PORT 1 VIEW  Result Date: 05/23/2022 CLINICAL DATA:  SOB s/p TAVR EXAM: PORTABLE CHEST 1 VIEW COMPARISON:  05/22/22 FINDINGS: Status post TAVR. Unchanged transvenous pacing device. Unchanged kyphoplasty changes. Aortic atherosclerotic calcifications. Possible trace left-sided pleural effusion. Compared to prior exam is new hazy pulmonary opacity at the right lung base. No pneumothorax. No displaced rib fracture. Visualized upper abdomen is unremarkable. IMPRESSION: Compared to prior exam there is a new hazy  pulmonary opacity at the right lung base, which may represent atelectasis, but infection is not excluded. Electronically Signed   By: Marin Roberts M.D.   On: 05/23/2022 08:27   ABORTED INVASIVE LAB PROCEDURE  Result Date: 05/23/2022 This case was aborted.  DG CHEST PORT 1 VIEW  Result Date: 05/22/2022 CLINICAL DATA:  Status post TAVR EXAM: PORTABLE CHEST 1 VIEW COMPARISON:  Chest x-ray dated May 20, 2022 FINDINGS: Cardiac and mediastinal contours are within normal limits status post TAVR. Transvenous pacing line is unchanged when compared with the prior exam. Mild bilateral interstitial opacities. Bibasilar atelectasis. No evidence of pneumothorax. IMPRESSION: Similar mild pulmonary edema and bibasilar atelectasis. Electronically Signed   By: Yetta Glassman M.D.   On: 05/22/2022 12:38      Assessment & Plan    #CHB Underlying rhythm in the 9s today. TVP OK, threshold < 1 Likely plan for device prior to discharge, pending improved respiratory status.   #AFL New. Hold AC for now given pending PPM implant.   #AS S/p TAVR   #Hypoxia Pulmonary following. Continue diuresis.   For questions or updates, please contact [place] Please consult [email or web address] for contact info under        Signed, Vickie Epley, MD  05/24/2022, 7:36 AM

## 2022-05-25 DIAGNOSIS — Z952 Presence of prosthetic heart valve: Secondary | ICD-10-CM | POA: Diagnosis not present

## 2022-05-25 LAB — BASIC METABOLIC PANEL
Anion gap: 9 (ref 5–15)
BUN: 16 mg/dL (ref 8–23)
CO2: 26 mmol/L (ref 22–32)
Calcium: 8.4 mg/dL — ABNORMAL LOW (ref 8.9–10.3)
Chloride: 105 mmol/L (ref 98–111)
Creatinine, Ser: 1.15 mg/dL — ABNORMAL HIGH (ref 0.44–1.00)
GFR, Estimated: 48 mL/min — ABNORMAL LOW (ref >60)
Glucose, Bld: 91 mg/dL (ref 70–99)
Potassium: 3.8 mmol/L (ref 3.5–5.1)
Sodium: 140 mmol/L (ref 135–145)

## 2022-05-25 LAB — CBC
HCT: 29.4 % — ABNORMAL LOW (ref 36.0–46.0)
Hemoglobin: 9.1 g/dL — ABNORMAL LOW (ref 12.0–15.0)
MCH: 29.4 pg (ref 26.0–34.0)
MCHC: 31 g/dL (ref 30.0–36.0)
MCV: 94.8 fL (ref 80.0–100.0)
Platelets: 194 10*3/uL (ref 150–400)
RBC: 3.1 MIL/uL — ABNORMAL LOW (ref 3.87–5.11)
RDW: 15.9 % — ABNORMAL HIGH (ref 11.5–15.5)
WBC: 5.6 10*3/uL (ref 4.0–10.5)
nRBC: 0 % (ref 0.0–0.2)

## 2022-05-25 LAB — HEPARIN LEVEL (UNFRACTIONATED): Heparin Unfractionated: 0.27 IU/mL — ABNORMAL LOW (ref 0.30–0.70)

## 2022-05-25 LAB — BRAIN NATRIURETIC PEPTIDE: B Natriuretic Peptide: 1646.1 pg/mL — ABNORMAL HIGH (ref 0.0–100.0)

## 2022-05-25 MED ORDER — HEPARIN (PORCINE) 25000 UT/250ML-% IV SOLN
1050.0000 [IU]/h | INTRAVENOUS | Status: DC
  Administered 2022-05-25 (×2): 1050 [IU]/h via INTRAVENOUS
  Filled 2022-05-25: qty 250

## 2022-05-25 MED ORDER — ORAL CARE MOUTH RINSE: 15.0000 mL | OROMUCOSAL | Status: DC | PRN

## 2022-05-25 NOTE — Progress Notes (Addendum)
ANTICOAGULATION CONSULT NOTE   Pharmacy Consult for heparin Indication:  atrial flutter  Allergies  Allergen Reactions   Bactrim [Sulfamethoxazole-Trimethoprim] Nausea And Vomiting   Dilaudid [Hydromorphone Hcl] Anaphylaxis   Dilaudid [Hydromorphone] Anaphylaxis   Acyclovir And Related     Unknown reaction   Crestor [Rosuvastatin Calcium] Other (See Comments)    Muscle pain   Gabapentin Swelling    Swelling in feet, and in legs   Aleve [Naproxen Sodium] Rash   Lyrica [Pregabalin] Other (See Comments)    Swelling in feet, and in legs    Patient Measurements: Height: '5\' 2"'$  (157.5 cm) Weight: 44 kg (97 lb) IBW/kg (Calculated) : 50.1 Heparin Dosing Weight: 45.6 kg  Vital Signs: Temp: 98.4 F (36.9 C) (11/19 0400) Temp Source: Oral (11/19 0400) BP: 133/63 (11/19 0500) Pulse Rate: 55 (11/19 0500)  Labs: Recent Labs    05/23/22 0914 05/23/22 0920 05/23/22 2225 05/24/22 0555 05/24/22 1810 05/25/22 0448  HGB 8.8*  --   --  9.4*  --   --   HCT 28.9*  --   --  29.0*  --   --   PLT 152  --   --  165  --   --   HEPARINUNFRC  --   --    < > 0.14* 0.16* 0.27*  CREATININE  --  1.22*  --  1.30*  --   --    < > = values in this interval not displayed.     Estimated Creatinine Clearance: 24 mL/min (A) (by C-G formula based on SCr of 1.3 mg/dL (H)).   Medical History: Past Medical History:  Diagnosis Date   Cataract    Depression    GERD (gastroesophageal reflux disease)    Hyperlipidemia    Hypertension    Hypothyroidism    IBS (irritable bowel syndrome)    Osteoarthritis    Raynaud disease    S/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 Ali Lowe & Bartle   Scleroderma [facility])    Thyroid disease    hypothyroidism    Assessment: Haley Trevino is s/p TAVR on 11/14 with complete heart block. She has a temp wire in place, but was found to be newly in atrial flutter today. Has a CHADS-VASc  score of 5. Hgb stable at 8.8, Plts 152. Pharmacy has been consulted for heparin while awaiting pacemaker placement.   11/19 AM update:  Heparin level sub-therapeutic, trending up  Goal of Therapy:  Heparin level 0.3-0.7 units/ml Monitor platelets by anticoagulation protocol: Yes   Plan:  Inc heparin to 1050 units/hr Re-check heparin level in 8 hours  Narda Bonds, PharmD, [place] Pharmacist Phone: [phone]

## 2022-05-25 NOTE — Progress Notes (Addendum)
ANTICOAGULATION CONSULT NOTE  Pharmacy Consult for heparin Indication:  atrial flutter  Allergies  Allergen Reactions   Bactrim [Sulfamethoxazole-Trimethoprim] Nausea And Vomiting   Dilaudid [Hydromorphone Hcl] Anaphylaxis   Dilaudid [Hydromorphone] Anaphylaxis   Acyclovir And Related     Unknown reaction   Crestor [Rosuvastatin Calcium] Other (See Comments)    Muscle pain   Gabapentin Swelling    Swelling in feet, and in legs   Aleve [Naproxen Sodium] Rash   Lyrica [Pregabalin] Other (See Comments)    Swelling in feet, and in legs    Patient Measurements: Height: '5\' 2"'$  (157.5 cm) Weight: 44 kg (97 lb) IBW/kg (Calculated) : 50.1 Heparin Dosing Weight: 45.6 kg  Vital Signs: Temp: 98.4 F (36.9 C) (11/19 0400) Temp Source: Oral (11/19 0400) BP: 132/54 (11/19 0800) Pulse Rate: 79 (11/19 0800)  Labs: Recent Labs    05/23/22 0914 05/23/22 0920 05/23/22 2225 05/24/22 0555 05/24/22 1810 05/25/22 0448  HGB 8.8*  --   --  9.4*  --  9.1*  HCT 28.9*  --   --  29.0*  --  29.4*  PLT 152  --   --  165  --  194  HEPARINUNFRC  --   --    < > 0.14* 0.16* 0.27*  CREATININE  --  1.22*  --  1.30*  --  1.15*   < > = values in this interval not displayed.     Estimated Creatinine Clearance: 27.1 mL/min (A) (by C-G formula based on SCr of 1.15 mg/dL (H)).   Medical History: Past Medical History:  Diagnosis Date   Cataract    Depression    GERD (gastroesophageal reflux disease)    Hyperlipidemia    Hypertension    Hypothyroidism    IBS (irritable bowel syndrome)    Osteoarthritis    Raynaud disease    S/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 Ali Lowe & Bartle   Scleroderma [facility])    Thyroid disease    hypothyroidism    Medications:  No anticoagulation PTA  Assessment: Ms. Large is s/p TAVR on 11/14 with complete heart block. She has a temp wire in place, but was found to be  newly in atrial flutter today. Has a CHADS-VASc score of 5. Hgb stable at 8.8, Plts 152. Pharmacy has been consulted for heparin while awaiting pacemaker placement.   Heparin adjusted this am, to stop at midnight for tentative PPM implant (stop time entered). Will defer recheck of heparin levels.  Goal of Therapy:  Heparin level 0.3-0.7 units/ml Monitor platelets by anticoagulation protocol: Yes   Plan:  Heparin 1050 units/h Plan to stop infusion Sunday 11/19 PM for possible pacemaker on 11/20  ADDENDUM 1415: No longer planning on PPM tomorrow, so heparin will NOT stop at midnight as previously planned.   Arrie Senate, PharmD, BCPS, [facility] Clinical Pharmacist [phone] Please check AMION for all [place] numbers 05/25/2022

## 2022-05-25 NOTE — Progress Notes (Signed)
Rounding Note    Patient Name: Haley Trevino Date of Encounter: 05/25/2022  [place] Cardiologist: [name], MD   Subjective   NAEO. Still on HFNC at 4.  Diarrhea reported this morning.   Inpatient Medications    Scheduled Meds:  acetaminophen  1,000 mg Oral QHS   aspirin  81 mg Oral Daily   Chlorhexidine Gluconate Cloth  6 each Topical Daily   cycloSPORINE  1 drop Both Eyes BID   ezetimibe  10 mg Oral Daily   FLUoxetine  40 mg Oral Daily   fluticasone  2 spray Each Nare Daily   furosemide  40 mg Intravenous BID   levothyroxine  75 mcg Oral Q0600   NIFEdipine  60 mg Oral Daily   mouth rinse  15 mL Mouth Rinse 4 times per day   pantoprazole  40 mg Oral Daily   sildenafil  20 mg Oral BID   sodium chloride flush  3 mL Intravenous Q12H   Continuous Infusions:  sodium chloride Stopped (05/25/22 0504)   sodium chloride 10 mL/hr at 05/25/22 0600   heparin 1,050 Units/hr (05/25/22 0600)   piperacillin-tazobactam (ZOSYN)  IV 12.5 mL/hr at 05/25/22 0600   PRN Meds: sodium chloride, sodium chloride, acetaminophen **OR** acetaminophen, clonazePAM, morphine injection, ondansetron (ZOFRAN) IV, mouth rinse, mouth rinse, oxyCODONE, sodium chloride, sodium chloride flush, traMADol   Vital Signs    Vitals:   05/25/22 0400 05/25/22 0500 05/25/22 0600 05/25/22 0700  BP: (!) 102/48 133/63 (!) 101/47 119/80  Pulse: (!) 56 (!) 55 (!) 56 (!) 55  Resp: 12     Temp: 98.4 F (36.9 C)     TempSrc: Oral     SpO2: 96% 93% 99% 96%  Weight:  44 kg    Height:        Intake/Output Summary (Last 24 hours) at 05/25/2022 0748 Last data filed at 05/25/2022 0600 Gross per 24 hour  Intake 928.46 ml  Output 1525 ml  Net -596.54 ml       05/25/2022    5:00 AM 05/24/2022    5:00 AM 05/23/2022    5:35 AM  Last 3 Weights  Weight (lbs) 97 lb 100 lb 1.4 oz 100 lb 8.5 oz  Weight (kg) 44 kg 45.4 kg 45.6 kg      Telemetry    Intermittent V pacing, AV  dissociation. - Personally Reviewed  ECG    Personally Reviewed  Physical Exam   GEN: No acute distress.  Frail. Elderly.  Neck: No JVD Cardiac: RRR, no murmurs, rubs, or gallops.  Respiratory: Clear to auscultation bilaterally. On HFNC. GI: Soft, nontender, non-distended  MS: No edema; No deformity. Neuro:  Nonfocal  Psych: Normal affect   Labs    High Sensitivity Troponin:  No results for input(s): "TROPONINIHS" in the last 720 hours.   Chemistry Recent Labs  Lab 05/21/22 0329 05/22/22 0453 05/23/22 0920 05/24/22 0555 05/25/22 0448  NA 136   < > 137 138 140  K 4.3   < > 3.3* 4.8 3.8  CL 104   < > 103 104 105  CO2 22   < > '23 23 26  '$ GLUCOSE 147*   < > 136* 104* 91  BUN 24*   < > '17 19 16  '$ CREATININE 1.45*   < > 1.22* 1.30* 1.15*  CALCIUM 8.7*   < > 8.3* 8.5* 8.4*  MG 2.0  --   --   --   --  GFRNONAA 36*   < > 45* 42* 48*  ANIONGAP 10   < > '11 11 9   '$ < > = values in this interval not displayed.     Lipids No results for input(s): "CHOL", "TRIG", "HDL", "LABVLDL", "LDLCALC", "CHOLHDL" in the last 168 hours.  Hematology Recent Labs  Lab 05/23/22 0914 05/24/22 0555 05/25/22 0448  WBC 9.5 7.1 5.6  RBC 3.01* 3.08* 3.10*  HGB 8.8* 9.4* 9.1*  HCT 28.9* 29.0* 29.4*  MCV 96.0 94.2 94.8  MCH 29.2 30.5 29.4  MCHC 30.4 32.4 31.0  RDW 16.0* 16.2* 15.9*  PLT 152 165 194    Thyroid No results for input(s): "TSH", "FREET4" in the last 168 hours.  BNP Recent Labs  Lab 05/23/22 0920 05/24/22 0555 05/25/22 0448  BNP 1,540.5* 1,698.0* 1,646.1*     DDimer No results for input(s): "DDIMER" in the last 168 hours.   Radiology    DG CHEST PORT 1 VIEW  Result Date: 05/23/2022 CLINICAL DATA:  SOB s/p TAVR EXAM: PORTABLE CHEST 1 VIEW COMPARISON:  05/22/22 FINDINGS: Status post TAVR. Unchanged transvenous pacing device. Unchanged kyphoplasty changes. Aortic atherosclerotic calcifications. Possible trace left-sided pleural effusion. Compared to prior exam is new hazy  pulmonary opacity at the right lung base. No pneumothorax. No displaced rib fracture. Visualized upper abdomen is unremarkable. IMPRESSION: Compared to prior exam there is a new hazy pulmonary opacity at the right lung base, which may represent atelectasis, but infection is not excluded. Electronically Signed   By: Marin Roberts M.D.   On: 05/23/2022 08:27      Assessment & Plan    #CHB Underlying rhythm in the 50s today. TVP OK, threshold 1 Likely plan for device prior to discharge, pending improved respiratory status. She needs to be off high flow oxygen before this happens.  #AFL New. Hold AC for now given pending PPM implant.   #AS S/p TAVR   #Hypoxia Pulmonary following. Continue diuresis.   For questions or updates, please contact [place] Please consult [email or web address] for contact info under        Signed, Vickie Epley, MD  05/25/2022, 7:48 AM

## 2022-05-25 NOTE — Progress Notes (Signed)
NAME:  Haley Trevino, MRN:  [id number], DOB:  [birth year], LOS: 30 ADMISSION DATE:  05/13/2022, CONSULTATION DATE:  05/20/2022 REFERRING MD: Alfredo Martinez -[facility], CHIEF COMPLAINT: Hypoxia post TAVR  History of Present Illness:  81 year old woman who underwent TAVR 11/13.  Significant perioperative hypoxia thought to be due to to pulmonary edema.  She had known aortic stenosis and was seen recently by Dr. Lavonna Monarch for worsening of her symptoms which had progressed to an NYHA class III.  She had a calculated aortic valve area of 1.1 cm and her cath did not show significant coronary artery disease.   Intra-op TAVR course c/b on-going nausea and hypoxia. PCCM asked to see post-operatively for hypoxia   Pertinent  Medical History  Scleroderma, Raynauds on Revatio,  HTN, HL, GERD, IBS, hypothyroidism, chronic diastolic HF, severe AS from MAC.   Significant Hospital Events: Including procedures, antibiotic start and stop dates in addition to other pertinent events   Admitted 11/7 for elective TAVR. Procedure delayed due to respiratory failure and pulm edema 11/14 went for TAVR.  Had sig nausea during procedure w/ increasing oxygen needs. Bradycardia/CBH s/p valve deployment requiring transvenous PM Returned to ICU post TAVR on NRB. PCCM asked to evaluate 11/16 unable to lie supine for PPM. Given diuretic.  11/17 remains ICU with TVP  11/18 O2 requirements are decreasing, heart rate stable.  Anticoagulation held planning for possible device placement Monday.  Interim History / Subjective:   Patient remains critically ill in the intensive care unit.  Comfortable sitting up in bed.  Pacer wires in place.  Plan for possible device placement tomorrow.  Objective   Blood pressure (!) 120/54, pulse 71, temperature 98.1 F (36.7 C), temperature source Oral, resp. rate 12, height _0  (1.575 m), weight 44 kg, SpO2 97 %.        Intake/Output Summary (Last 24 hours) at 05/25/2022  0950 Last data filed at 05/25/2022 0900 Gross per 24 hour  Intake 967.34 ml  Output 1325 ml  Net -357.66 ml   Filed Weights   05/23/22 0535 05/24/22 0500 05/25/22 0500  Weight: 45.6 kg 45.4 kg 44 kg    Examination: General: Elderly frail female resting in chair HENT: NCAT, tracking appropriately Lungs: No crackles, clear to auscultation Cardiovascular: Heart rate regular stable Abdomen: Soft, nontender nondistended Extremities: No edema Neuro: Alert oriented following commands  Ancillary tests personally reviewed   Serum creatinine 1.3, mild increase  Assessment & Plan:   Acute hypoxic respiratory failure suspect pulmonary edema but also consider aspiration.  - CXR today with small amount of atelectasis, versus small infiltrate in the right lower lobe consistent with pneumonia P: Complete 5-day course of antibiotics currently on Zosyn Continue to wean FiO2 to maintain sats above 90% Continue early mobility flutter and I-S Up in chair as much as tolerated.  Severe AS now S/p TAVR 11/14;  intra-op course c/b Symptomatic bradycardia and HTN Complete Heart Block s/p TVP -Plan: Diuresis TVP remains in place Device placement possibly Monday per EP.  H/o Raynaud's  H/p scleroderma  Plan: Sildenafil continued May need HRCT outpatient.  H/o hypothyroidism  Continue Synthroid  Hypokalemia  Repeat to maintain goal potassium greater than 4.  Best Practice (right click and "Reselect all SmartList Selections" daily)   Diet/type: Regular consistency (see orders) DVT prophylaxis: LMWH GI prophylaxis: N/A Lines: N/A Foley:  Yes, and it is still needed Code Status:  full code Last date of multidisciplinary goals of care discussion [patient and family  updated at the bedside ]  This patient is critically ill with multiple organ system failure; which, requires frequent high complexity decision making, assessment, support, evaluation, and titration of therapies. This was  completed through the application of advanced monitoring technologies and extensive interpretation of multiple databases. During this encounter critical care time was devoted to patient care services described in this note for 32 minutes.   Garner Nash, DO [place] [facility] 05/25/2022 9:50 AM

## 2022-05-26 ENCOUNTER — Other Ambulatory Visit (HOSPITAL_COMMUNITY): Payer: Self-pay

## 2022-05-26 ENCOUNTER — Ambulatory Visit: Payer: Medicare Other | Admitting: Physician Assistant

## 2022-05-26 ENCOUNTER — Encounter: Payer: Self-pay | Admitting: Hematology

## 2022-05-26 DIAGNOSIS — R0603 Acute respiratory distress: Secondary | ICD-10-CM | POA: Diagnosis not present

## 2022-05-26 DIAGNOSIS — Z952 Presence of prosthetic heart valve: Secondary | ICD-10-CM | POA: Diagnosis not present

## 2022-05-26 DIAGNOSIS — J9601 Acute respiratory failure with hypoxia: Secondary | ICD-10-CM | POA: Diagnosis not present

## 2022-05-26 DIAGNOSIS — I350 Nonrheumatic aortic (valve) stenosis: Secondary | ICD-10-CM | POA: Diagnosis not present

## 2022-05-26 DIAGNOSIS — I5033 Acute on chronic diastolic (congestive) heart failure: Secondary | ICD-10-CM | POA: Diagnosis not present

## 2022-05-26 LAB — CBC
HCT: 30.7 % — ABNORMAL LOW (ref 36.0–46.0)
Hemoglobin: 10.1 g/dL — ABNORMAL LOW (ref 12.0–15.0)
MCH: 30.6 pg (ref 26.0–34.0)
MCHC: 32.9 g/dL (ref 30.0–36.0)
MCV: 93 fL (ref 80.0–100.0)
Platelets: 196 10*3/uL (ref 150–400)
RBC: 3.3 MIL/uL — ABNORMAL LOW (ref 3.87–5.11)
RDW: 16 % — ABNORMAL HIGH (ref 11.5–15.5)
WBC: 6.2 10*3/uL (ref 4.0–10.5)
nRBC: 0 % (ref 0.0–0.2)

## 2022-05-26 LAB — HEPARIN LEVEL (UNFRACTIONATED): Heparin Unfractionated: 0.39 IU/mL (ref 0.30–0.70)

## 2022-05-26 MED ORDER — HEPARIN (PORCINE) 25000 UT/250ML-% IV SOLN: 1050.0000 [IU]/h | INTRAVENOUS | Status: AC

## 2022-05-26 MED ORDER — FUROSEMIDE 20 MG PO TABS
20.0000 mg | ORAL_TABLET | Freq: Every day | ORAL | Status: DC
  Administered 2022-05-26 – 2022-05-29 (×4): 20 mg via ORAL
  Filled 2022-05-26 (×4): qty 1

## 2022-05-26 MED ORDER — SODIUM CHLORIDE 0.9% FLUSH
3.0000 mL | Freq: Two times a day (BID) | INTRAVENOUS | Status: DC
  Administered 2022-05-26 – 2022-05-28 (×4): 3 mL via INTRAVENOUS

## 2022-05-26 NOTE — TOC Benefit Eligibility Note (Signed)
Patient Teacher, English as a foreign language completed.    The patient is currently admitted and upon discharge could be taking Eliquis 5 mg.  The current 30 day co-pay is $10.35.   The patient is currently admitted and upon discharge could be taking Xarelto 20 mg.  The current 30 day co-pay is $10.35.   The patient is insured through [employer], [place] Patient Advocate Specialist [place] Patient Advocate Team Direct Number: [phone]  Fax: [phone]

## 2022-05-26 NOTE — Progress Notes (Addendum)
[facility] VALVE TEAM  Patient Name: GEANIE PACIFICO Date of Encounter: 05/26/2022  Primary Cardiologist: [name], MD  / Dr. Ali Lowe & Dr. Cyndia Bent (TAVR)     Hospital Problem List     Principal Problem:   S/P TAVR (transcatheter aortic valve replacement) Active Problems:   [name]   Hypertension   Hyperlipidemia with target LDL less than 100   Aortic stenosis   Acute on chronic diastolic heart failure (HCC)   Acute respiratory distress   Anxiety   [name]   Hyperkalemia  Subjective   Sitting up in the chair. Daughter and husband at bedside. Doing much better. Breathing improved.   Inpatient Medications    Scheduled Meds:  acetaminophen  1,000 mg Oral QHS   aspirin  81 mg Oral Daily   Chlorhexidine Gluconate Cloth  6 each Topical Daily   cycloSPORINE  1 drop Both Eyes BID   ezetimibe  10 mg Oral Daily   FLUoxetine  40 mg Oral Daily   fluticasone  2 spray Each Nare Daily   furosemide  20 mg Oral Daily   levothyroxine  75 mcg Oral Q0600   NIFEdipine  60 mg Oral Daily   pantoprazole  40 mg Oral Daily   sildenafil  20 mg Oral BID   sodium chloride flush  3 mL Intravenous Q12H   Continuous Infusions:  sodium chloride Stopped (05/25/22 1502)   sodium chloride Stopped (05/25/22 1509)   PRN Meds: sodium chloride, sodium chloride, acetaminophen **OR** acetaminophen, clonazePAM, morphine injection, ondansetron (ZOFRAN) IV, mouth rinse, oxyCODONE, sodium chloride, sodium chloride flush, traMADol   Vital Signs    Vitals:   05/26/22 0300 05/26/22 0400 05/26/22 0500 05/26/22 0600  BP: (!) 102/50 (!) 104/51 (!) 94/51 (!) 125/51  Pulse: 69 (!) 195 71 70  Resp:      Temp:      TempSrc:      SpO2: 95% 94% 97% 97%  Weight:   43.6 kg   Height:        Intake/Output Summary (Last 24 hours) at 05/26/2022 0815 Last data filed at 05/26/2022 0600 Gross per 24 hour  Intake 424.32 ml   Output 750 ml  Net -325.68 ml   Filed Weights   05/24/22 0500 05/25/22 0500 05/26/22 0500  Weight: 45.4 kg 44 kg 43.6 kg    Physical Exam    GEN: Well nourished, well developed, in no acute distress. Temp pacer in right neck HEENT: Grossly normal.  Neck: Supple, no JVD, or masses. Cardiac: Aflutter HR 60s converted to CHB HR 40s while in the room. soft flow murmur, No rubs, or gallops. No clubbing, cyanosis, edema.   Respiratory:  Respirations regular and unlabored Skin: warm and dry, no rash. Extremities: no edema Psych: AAOx3.  Normal affect.  Labs    CBC Recent Labs    05/23/22 0914 05/24/22 0555 05/25/22 0448 05/26/22 0413  WBC 9.5   < > 5.6 6.2  NEUTROABS 7.9*  --   --   --   HGB 8.8*   < > 9.1* 10.1*  HCT 28.9*   < > 29.4* 30.7*  MCV 96.0   < > 94.8 93.0  PLT 152   < > 194 196   < > = values in this interval not displayed.   Basic Metabolic Panel Recent Labs    05/24/22 0555 05/25/22 0448  NA 138 140  K 4.8 3.8  CL  104 105  CO2 23 26  GLUCOSE 104* 91  BUN 19 16  CREATININE 1.30* 1.15*  CALCIUM 8.5* 8.4*    Telemetry    A-flutter HR 60s and pacing spikes- Personally Reviewed  ECG    No new - Personally Reviewed  Radiology    No results found.  Cardiac Studies   [facility]  TAVR OPERATIVE NOTE     Date of Procedure:                05/20/2022   Preoperative Diagnosis:      Severe Aortic Stenosis    Postoperative Diagnosis:    Same    Procedure:        Transcatheter Aortic Valve Replacement - Transfemoral Approach             Edwards Sapien 3 Resilia THV (size 53m, model # 9755RLS, serial # [id number])              Co-Surgeons:                         BGaye Pollack MD and ALenna Sciara MD Anesthesiologist:                  WRoderic Palau MD   Echocardiographer:              PJenkins Rouge MD   Pre-operative Echo Findings: Severe aortic stenosis Normal left ventricular systolic function   Post-operative  Echo Findings: No paravalvular leak Normal left ventricular systolic function   Left Heart Catheterization Findings: Left ventricular end-diastolic pressure of [id number]  ______________________  Echo - 05/21/2022 1. Left ventricular ejection fraction, by estimation, is 60 to 65%. The  left ventricle has normal function. The left ventricle has no regional  wall motion abnormalities. There is moderate left ventricular hypertrophy.  Left ventricular diastolic  parameters are indeterminate.   2. Right ventricular systolic function is normal. The right ventricular  size is normal. There is normal pulmonary artery systolic pressure.   3. Left atrial size was mildly dilated.   4. Moderate functional MS due to MAC. The mitral valve is degenerative.  Trivial mitral valve regurgitation. Moderate mitral stenosis. Severe  mitral annular calcification.   5. Tricuspid valve regurgitation is moderate.   6. Post TAVR with 23 mm Sapien 3 valve implant 05/20/22 no PVL mean  gradient 11.5 mm peak 24.8 mm DVI 0.74 AVA 2.5 cm2 Trivial central AR .  The aortic valve has been repaired/replaced. Aortic valve regurgitation is  trivial. No aortic stenosis is  present.   7. The inferior vena cava is normal in size with greater than 50%  respiratory variability, suggesting right atrial pressure of 3 mmHg.   Patient Profile     JDALEXA GENTZis a 81y.o. female with a history of scleroderma and Raynaud's on chronic Revatio therapy, HLD, iron deficiency anemia managed with intermittent iron infusion, chronic diastolic CHF, moderate mitral stenosis due to MAC, and severe AS [facility]. However, she was found to be hypoxic on anesthesia induction and case was aborted.  She was admitted for acute respiratory failure and acute anemia.     Assessment & Plan    Severe AS: s/p successful TAVR with a 23 mm Edwards Sapien 3 Ultra Resilia THV via the TF approach on 05/20/22. Post  operative echo 05/21/22 showed EF 60%, normally functioning TAVR with a mean  gradient 11.5 and trivial AR. Groin sites are stable. Started on a baby Asprin 81 mg daily. Will stop this now as she is on IV heparin with plans for [facility] given new onset atrial flutter.   CHB: pt had complete heart block after valve deployment. Has a IJ temp wire in place. Currently V paced with underlying atrial flutter. PPM placement canceled 05/22/22 due to hypoxia on the table. Plan for device placement after the weekend. Keep NPO for possible pacer today. She is much improved from a respiratory standpoint.  Atrial Flutter: new onset this admission. Rate controlled. Currently on IV heparin. Plan to start a DOAC after PPM.   Acute respiratory failure with hypoxia: completed 5-day course of Zosyn for possible aspiration PNA (n/v during case). Aggressively diuresed. 02 sat improved to 90s on 1L. Continue early mobility flutter and I-S. Up in chair as much as tolerated. Pulm following. Plan HRCT to rule out CREST syndrome as an outpatient.   Acute on chronic diastolic CHF: she has been on IV Lasix. Net neg 4.9L. Weight down 4 lbs (100--> 96 lbs.)  Convert to Lasix 100m PO today. Creat stable.    AKI: creat peaked at 1.45. Down to 1.15 today.    Leukocytosis: WBC up to 18.8 05/21/22. Possibly related to aspiration. Treated with IV abx. Now normal.      Chronic iron deficiency anemia: follows closely with GI and hematology. She receives Q3 week iron infusions. Transfused 1 unit PRBC on admission. Hg stable at 10,1 today. Continue to monitor   Anxiety: has worsening anxiety during admission and continued on home meds.    Moderate mitral stenosis with MAC: continue to follow with surveillance imaging  H/o Raynaud's & scleroderma: Sildenafil continued. May need HRCT outpatient.    Signed, KAngelena Form PA-C,    05/26/2022, 8:15 AM

## 2022-05-26 NOTE — Progress Notes (Signed)
ANTICOAGULATION CONSULT NOTE - Follow Up Consult  Pharmacy Consult for heparin Indication:  atrial flutter  Allergies  Allergen Reactions   Bactrim [Sulfamethoxazole-Trimethoprim] Nausea And Vomiting   Dilaudid [Hydromorphone Hcl] Anaphylaxis   Dilaudid [Hydromorphone] Anaphylaxis   Acyclovir And Related     Unknown reaction   Crestor [Rosuvastatin Calcium] Other (See Comments)    Muscle pain   Gabapentin Swelling    Swelling in feet, and in legs   Aleve [Naproxen Sodium] Rash   Lyrica [Pregabalin] Other (See Comments)    Swelling in feet, and in legs    Patient Measurements: Height: '5\' 2"'$  (157.5 cm) Weight: 43.6 kg (96 lb 1.9 oz) IBW/kg (Calculated) : 50.1 Heparin Dosing Weight: 45.6 kg  Vital Signs: Temp: 98.9 F (37.2 C) (11/20 0800) Temp Source: Oral (11/20 0800) BP: 148/66 (11/20 0800) Pulse Rate: 100 (11/20 0800)  Labs: Recent Labs    05/24/22 0555 05/24/22 1810 05/25/22 0448 05/26/22 0413  HGB 9.4*  --  9.1* 10.1*  HCT 29.0*  --  29.4* 30.7*  PLT 165  --  194 196  HEPARINUNFRC 0.14* 0.16* 0.27* 0.39  CREATININE 1.30*  --  1.15*  --     Estimated Creatinine Clearance: 26.9 mL/min (A) (by C-G formula based on SCr of 1.15 mg/dL (H)).   Medications:  No anticoagulation PTA   Assessment: Haley Trevino is s/p TAVR on 11/14 with complete heart block. She has a temp wire in place, but was found to be newly in atrial flutter. Has a CHADS-VASc score of 5. Hgb stable at 10.1, Plts 196. Pharmacy has been consulted for heparin while awaiting pacemaker placement.    Heparin level therapeutic at 0.39 this AM. Now planning for PPM tomorrow (stop time entered).   Goal of Therapy:  Heparin level 0.3-0.7 units/ml Monitor platelets by anticoagulation protocol: Yes   Plan:  Continue heparin 1050 units/hr Plan to stop infusion Monday 11/20 PM for possible pacemaker on 11/21 Follow-up transition to oral anticoagulation following PPM.   Sinda Du, PharmD  Candidate 05/26/2022,9:34 AM

## 2022-05-26 NOTE — Progress Notes (Signed)
   NAME:  CECI TALIAFERRO, MRN:  [id number], DOB:  1940-10-25, LOS: 54 ADMISSION DATE:  05/13/2022, CONSULTATION DATE:  05/20/2022 REFERRING MD: Alfredo Martinez -[facility], CHIEF COMPLAINT: Hypoxia post TAVR  History of Present Illness:  81 year old woman who underwent TAVR 11/13.  Significant perioperative hypoxia thought to be due to to pulmonary edema.  She had known aortic stenosis and was seen recently by Dr. Lavonna Monarch for worsening of her symptoms which had progressed to an NYHA class III.  She had a calculated aortic valve area of 1.1 cm and her cath did not show significant coronary artery disease.   Intra-op TAVR course c/b on-going nausea and hypoxia. PCCM asked to see post-operatively for hypoxia   Pertinent  Medical History  Scleroderma, Raynauds on Revatio,  HTN, HL, GERD, IBS, hypothyroidism, chronic diastolic HF, severe AS from MAC.   Significant Hospital Events: Including procedures, antibiotic start and stop dates in addition to other pertinent events   Admitted 11/7 for elective TAVR. Procedure delayed due to respiratory failure and pulm edema 11/14 went for TAVR.  Had sig nausea during procedure w/ increasing oxygen needs. Bradycardia/CBH s/p valve deployment requiring transvenous PM Returned to ICU post TAVR on NRB. PCCM asked to evaluate 11/16 unable to lie supine for PPM. Given diuretic.  11/17 remains ICU with TVP  11/18 O2 requirements are decreasing, heart rate stable.  Anticoagulation held planning for possible device placement Monday.  Interim History / Subjective:  No distress  Objective   Blood pressure 127/62, pulse 77, temperature 98.9 F (37.2 C), temperature source Oral, resp. rate 12, height _0  (1.575 m), weight 43.6 kg, SpO2 97 %.        Intake/Output Summary (Last 24 hours) at 05/26/2022 0943 Last data filed at 05/26/2022 0932 Gross per 24 hour  Intake 422.97 ml  Output 850 ml  Net -427.03 ml   Filed Weights   05/24/22 0500 05/25/22 0500  05/26/22 0500  Weight: 45.4 kg 44 kg 43.6 kg    Examination: General 81 year old female who remains in ICU w/ TVP HENT NCAT no JVD  Pulm clear no accessory  use Card RR w/ paced rhythm Ext warm no edema Neuro intact   Ancillary tests personally reviewed   Serum creatinine 1.3, mild increase  Assessment & Plan:   Acute hypoxic respiratory failure suspect pulmonary edema but also consider aspiration.  - CXR today with small amount of atelectasis, versus small infiltrate in the right lower lobe consistent with pneumonia Completed 5 d abx  Plan Wean o2 IS and flutter PRN cxr Mobilize   Severe AS now S/p TAVR 11/14;  intra-op course c/b Symptomatic bradycardia and HTN Complete Heart Block s/p TVP Plan Cont tele Diuretics per cards For PPM today    H/o Raynaud's  H/p scleroderma  Plan: Cont sildenafil  Consider HRCT as out pt   H/o hypothyroidism  Plan Synthroid     Best Practice (right click and "Reselect all SmartList Selections" daily)   Diet/type: Regular consistency (see orders) DVT prophylaxis: LMWH GI prophylaxis: N/A Lines: N/A Foley:  Yes, and it is still needed Code Status:  full code Last date of multidisciplinary goals of care discussion [patient and family updated at the bedside ]  Erick Colace ACNP-BC [place] Pager # [phone] OR # [phone] if no answer

## 2022-05-26 NOTE — Progress Notes (Signed)
Rounding Note    Patient Name: Haley Trevino Date of Encounter: 05/25/2022  [place] Cardiologist: [name], MD   Subjective   No CP, denies SOB on 1L, sitting in chair  Inpatient Medications    Scheduled Meds:  acetaminophen  1,000 mg Oral QHS   aspirin  81 mg Oral Daily   Chlorhexidine Gluconate Cloth  6 each Topical Daily   cycloSPORINE  1 drop Both Eyes BID   ezetimibe  10 mg Oral Daily   FLUoxetine  40 mg Oral Daily   fluticasone  2 spray Each Nare Daily   furosemide  40 mg Intravenous BID   levothyroxine  75 mcg Oral Q0600   NIFEdipine  60 mg Oral Daily   mouth rinse  15 mL Mouth Rinse 4 times per day   pantoprazole  40 mg Oral Daily   sildenafil  20 mg Oral BID   sodium chloride flush  3 mL Intravenous Q12H   Continuous Infusions:  sodium chloride Stopped (05/25/22 0504)   sodium chloride 10 mL/hr at 05/25/22 0600   heparin 1,050 Units/hr (05/25/22 0600)   piperacillin-tazobactam (ZOSYN)  IV 12.5 mL/hr at 05/25/22 0600   PRN Meds: sodium chloride, sodium chloride, acetaminophen **OR** acetaminophen, clonazePAM, morphine injection, ondansetron (ZOFRAN) IV, mouth rinse, mouth rinse, oxyCODONE, sodium chloride, sodium chloride flush, traMADol   Vital Signs    Vitals:   05/25/22 0400 05/25/22 0500 05/25/22 0600 05/25/22 0700  BP: (!) 102/48 133/63 (!) 101/47 119/80  Pulse: (!) 56 (!) 55 (!) 56 (!) 55  Resp: 12     Temp: 98.4 F (36.9 C)     TempSrc: Oral     SpO2: 96% 93% 99% 96%  Weight:  44 kg    Height:        Intake/Output Summary (Last 24 hours) at 05/25/2022 0748 Last data filed at 05/25/2022 0600 Gross per 24 hour  Intake 928.46 ml  Output 1525 ml  Net -596.54 ml       05/25/2022    5:00 AM 05/24/2022    5:00 AM 05/23/2022    5:35 AM  Last 3 Weights  Weight (lbs) 97 lb 100 lb 1.4 oz 100 lb 8.5 oz  Weight (kg) 44 kg 45.4 kg 45.6 kg      Telemetry    Intermittent V pacing, remains w/AV dissociation. -  Personally Reviewed  ECG    No new EKGs  05/21/22 TTE  1. Left ventricular ejection fraction, by estimation, is 60 to 65%. The  left ventricle has normal function. The left ventricle has no regional  wall motion abnormalities. There is moderate left ventricular hypertrophy.  Left ventricular diastolic  parameters are indeterminate.   2. Right ventricular systolic function is normal. The right ventricular  size is normal. There is normal pulmonary artery systolic pressure.   3. Left atrial size was mildly dilated.   4. Moderate functional MS due to MAC. The mitral valve is degenerative.  Trivial mitral valve regurgitation. Moderate mitral stenosis. Severe  mitral annular calcification.   5. Tricuspid valve regurgitation is moderate.   6. Post TAVR with 23 mm Sapien 3 valve implant 05/20/22 no PVL mean  gradient 11.5 mm peak 24.8 mm DVI 0.74 AVA 2.5 cm2 Trivial central AR .  The aortic valve has been repaired/replaced. Aortic valve regurgitation is  trivial. No aortic stenosis is  present.   7. The inferior vena cava is normal in size with greater than 50%  respiratory variability,  suggesting right atrial pressure of 3 mmHg.   Physical Exam   Exam is unchanged GEN: No acute distress.  Frail. Elderly.  Neck: No JVD, TVP R IJ Cardiac: RRR, no murmurs, rubs, or gallops.  Respiratory: CTA b/l GI: Soft, nontender, non-distended  MS: No edema; No deformity, advanced atrophy Neuro:  Nonfocal  Psych: Normal affect   Labs    High Sensitivity Troponin:  No results for input(s): "TROPONINIHS" in the last 720 hours.   Chemistry Recent Labs  Lab 05/21/22 0329 05/22/22 0453 05/23/22 0920 05/24/22 0555 05/25/22 0448  NA 136   < > 137 138 140  K 4.3   < > 3.3* 4.8 3.8  CL 104   < > 103 104 105  CO2 22   < > _0 GLUCOSE 147*   < > 136* 104* 91  BUN 24*   < > _1 CREATININE 1.45*   < > 1.22* 1.30* 1.15*  CALCIUM 8.7*   < > 8.3* 8.5* 8.4*  MG 2.0  --   --   --   --    GFRNONAA 36*   < > 45* 42* 48*  ANIONGAP 10   < > _2 < > = values in this interval not displayed.     Lipids No results for input(s): "CHOL", "TRIG", "HDL", "LABVLDL", "LDLCALC", "CHOLHDL" in the last 168 hours.  Hematology Recent Labs  Lab 05/23/22 0914 05/24/22 0555 05/25/22 0448  WBC 9.5 7.1 5.6  RBC 3.01* 3.08* 3.10*  HGB 8.8* 9.4* 9.1*  HCT 28.9* 29.0* 29.4*  MCV 96.0 94.2 94.8  MCH 29.2 30.5 29.4  MCHC 30.4 32.4 31.0  RDW 16.0* 16.2* 15.9*  PLT 152 165 194    Thyroid No results for input(s): "TSH", "FREET4" in the last 168 hours.  BNP Recent Labs  Lab 05/23/22 0920 05/24/22 0555 05/25/22 0448  BNP 1,540.5* 1,698.0* 1,646.1*     DDimer No results for input(s): "DDIMER" in the last 168 hours.   Radiology    DG CHEST PORT 1 VIEW  Result Date: 05/23/2022 CLINICAL DATA:  SOB s/p TAVR EXAM: PORTABLE CHEST 1 VIEW COMPARISON:  05/22/22 FINDINGS: Status post TAVR. Unchanged transvenous pacing device. Unchanged kyphoplasty changes. Aortic atherosclerotic calcifications. Possible trace left-sided pleural effusion. Compared to prior exam is new hazy pulmonary opacity at the right lung base. No pneumothorax. No displaced rib fracture. Visualized upper abdomen is unremarkable. IMPRESSION: Compared to prior exam there is a new hazy pulmonary opacity at the right lung base, which may represent atelectasis, but infection is not excluded. Electronically Signed   By: Marin Roberts M.D.   On: 05/23/2022 08:27      Assessment & Plan     81/y/o Female w/PMHx of Scleroderma, Raynauds on Revatio,  HTN, HL, GERD, IBS, hypothyroidism, chronic diastolic HF, severe AS from MAC.   Admitted 11/7 for elective TAVR. Procedure delayed due to respiratory failure and pulm edema 11/14 went for TAVR.  Had sig nausea during procedure w/ increasing oxygen needs. Bradycardia/CBH s/p valve deployment requiring transvenous PM Returned to ICU post TAVR on NRB. PCCM asked to evaluate 11/16  unable to lie supine for PPM. Given diuretic.  11/17 remains ICU with TVP  11/18 O2 requirements are decreasing,   CHB C/w AV dissociation and intermittent V pacing Threshold TVP is <1 today   2.  AFL New for her Heparin gtt    3.  AS S/p TAVR 11/14 Delayed initially  2/2 respiratory insufficiency    4. . Hypoxia Pulmonary/CCM is  following. Completed 5 days of Zosyn Much improved respiratory status 1L n/c this AM  Sildenafil continued May need HRCT outpatient.    Patient slept inclined last night Asked to try and see today once back in bed if able tto tolerate flat/near flat position She is doing better respiratory-ise On heparin gtt Plan to hold at [facility] tonight for [facility] tomorrow  For questions or updates, please contact [place] Please consult [email or web address] for contact info under        Signed, Vickie Epley, MD  05/25/2022, 7:48 AM

## 2022-05-27 ENCOUNTER — Encounter (HOSPITAL_COMMUNITY)
Admission: RE | Disposition: A | Discharge status: Home / Self Care | Payer: Self-pay | Source: Home / Self Care | Attending: Internal Medicine

## 2022-05-27 ENCOUNTER — Encounter (HOSPITAL_COMMUNITY): Payer: Self-pay | Admitting: Cardiology

## 2022-05-27 DIAGNOSIS — I5033 Acute on chronic diastolic (congestive) heart failure: Secondary | ICD-10-CM | POA: Diagnosis not present

## 2022-05-27 DIAGNOSIS — J9601 Acute respiratory failure with hypoxia: Secondary | ICD-10-CM | POA: Diagnosis not present

## 2022-05-27 DIAGNOSIS — E785 Hyperlipidemia, unspecified: Secondary | ICD-10-CM

## 2022-05-27 DIAGNOSIS — I10 Essential (primary) hypertension: Secondary | ICD-10-CM

## 2022-05-27 DIAGNOSIS — Z952 Presence of prosthetic heart valve: Secondary | ICD-10-CM | POA: Diagnosis not present

## 2022-05-27 DIAGNOSIS — I350 Nonrheumatic aortic (valve) stenosis: Secondary | ICD-10-CM | POA: Diagnosis not present

## 2022-05-27 DIAGNOSIS — I442 Atrioventricular block, complete: Secondary | ICD-10-CM | POA: Insufficient documentation

## 2022-05-27 HISTORY — PX: PACEMAKER IMPLANT: EP1218

## 2022-05-27 LAB — BASIC METABOLIC PANEL
Anion gap: 9 (ref 5–15)
BUN: 10 mg/dL (ref 8–23)
CO2: 23 mmol/L (ref 22–32)
Calcium: 8.5 mg/dL — ABNORMAL LOW (ref 8.9–10.3)
Chloride: 107 mmol/L (ref 98–111)
Creatinine, Ser: 0.84 mg/dL (ref 0.44–1.00)
GFR, Estimated: >60 mL/min (ref >60)
Glucose, Bld: 90 mg/dL (ref 70–99)
Potassium: 5 mmol/L (ref 3.5–5.1)
Sodium: 139 mmol/L (ref 135–145)

## 2022-05-27 LAB — CBC
HCT: 31.1 % — ABNORMAL LOW (ref 36.0–46.0)
Hemoglobin: 9.8 g/dL — ABNORMAL LOW (ref 12.0–15.0)
MCH: 30.2 pg (ref 26.0–34.0)
MCHC: 31.5 g/dL (ref 30.0–36.0)
MCV: 95.7 fL (ref 80.0–100.0)
Platelets: 241 10*3/uL (ref 150–400)
RBC: 3.25 MIL/uL — ABNORMAL LOW (ref 3.87–5.11)
RDW: 16.2 % — ABNORMAL HIGH (ref 11.5–15.5)
WBC: 6 10*3/uL (ref 4.0–10.5)
nRBC: 0 % (ref 0.0–0.2)

## 2022-05-27 SURGERY — PACEMAKER IMPLANT

## 2022-05-27 MED ORDER — ENSURE ENLIVE PO LIQD
237.0000 mL | Freq: Two times a day (BID) | ORAL | Status: DC
  Administered 2022-05-27 – 2022-05-28 (×3): 237 mL via ORAL

## 2022-05-27 MED ORDER — MIDAZOLAM HCL 5 MG/5ML IJ SOLN
INTRAMUSCULAR | Status: DC | PRN
  Administered 2022-05-27 (×4): .5 mg via INTRAVENOUS

## 2022-05-27 MED ORDER — LOPERAMIDE HCL 2 MG PO CAPS
2.0000 mg | ORAL_CAPSULE | Freq: Once | ORAL | Status: AC
  Administered 2022-05-27: 2 mg via ORAL
  Filled 2022-05-27: qty 1

## 2022-05-27 MED ORDER — LIDOCAINE HCL (PF) 1 % IJ SOLN
INTRAMUSCULAR | Status: AC
  Filled 2022-05-27: qty 30

## 2022-05-27 MED ORDER — CHLORHEXIDINE GLUCONATE 4 % EX LIQD
60.0000 mL | Freq: Once | CUTANEOUS | Status: AC
  Administered 2022-05-27: 4 via TOPICAL

## 2022-05-27 MED ORDER — SODIUM CHLORIDE 0.9% FLUSH: 3.0000 mL | INTRAVENOUS | Status: DC | PRN

## 2022-05-27 MED ORDER — SODIUM CHLORIDE 0.9 % IV SOLN
250.0000 mL | INTRAVENOUS | Status: DC
  Administered 2022-05-27: 250 mL via INTRAVENOUS

## 2022-05-27 MED ORDER — FENTANYL CITRATE (PF) 100 MCG/2ML IJ SOLN
INTRAMUSCULAR | Status: DC | PRN
  Administered 2022-05-27 (×4): 12.5 ug via INTRAVENOUS

## 2022-05-27 MED ORDER — CEFAZOLIN SODIUM-DEXTROSE 2-4 GM/100ML-% IV SOLN
INTRAVENOUS | Status: AC
  Filled 2022-05-27: qty 100

## 2022-05-27 MED ORDER — MIDAZOLAM HCL 5 MG/5ML IJ SOLN
INTRAMUSCULAR | Status: AC
  Filled 2022-05-27: qty 5

## 2022-05-27 MED ORDER — SODIUM CHLORIDE 0.9 % IV SOLN
INTRAVENOUS | Status: AC
  Filled 2022-05-27: qty 2

## 2022-05-27 MED ORDER — LOPERAMIDE HCL 2 MG PO CAPS
2.0000 mg | ORAL_CAPSULE | ORAL | Status: DC | PRN
  Administered 2022-05-27: 2 mg via ORAL
  Filled 2022-05-27: qty 1

## 2022-05-27 MED ORDER — SODIUM CHLORIDE 0.9 % IV SOLN
80.0000 mg | INTRAVENOUS | Status: AC
  Administered 2022-05-27: 80 mg
  Filled 2022-05-27: qty 2

## 2022-05-27 MED ORDER — HEPARIN (PORCINE) IN NACL 1000-0.9 UT/500ML-% IV SOLN
INTRAVENOUS | Status: DC | PRN
  Administered 2022-05-27: 500 mL

## 2022-05-27 MED ORDER — SODIUM CHLORIDE 0.9 % IV SOLN: 250.0000 mL | INTRAVENOUS | Status: DC

## 2022-05-27 MED ORDER — FENTANYL CITRATE (PF) 100 MCG/2ML IJ SOLN
INTRAMUSCULAR | Status: AC
  Filled 2022-05-27: qty 2

## 2022-05-27 MED ORDER — SODIUM CHLORIDE 0.9 % IV SOLN: INTRAVENOUS | Status: DC

## 2022-05-27 MED ORDER — CEFAZOLIN SODIUM-DEXTROSE 2-4 GM/100ML-% IV SOLN
2.0000 g | INTRAVENOUS | Status: AC
  Administered 2022-05-27: 2 g via INTRAVENOUS

## 2022-05-27 MED ORDER — HEPARIN (PORCINE) IN NACL 1000-0.9 UT/500ML-% IV SOLN
INTRAVENOUS | Status: AC
  Filled 2022-05-27: qty 500

## 2022-05-27 MED ORDER — LIDOCAINE HCL (PF) 1 % IJ SOLN
INTRAMUSCULAR | Status: DC | PRN
  Administered 2022-05-27: 60 mL

## 2022-05-27 SURGICAL SUPPLY — 15 items
CABLE SURGICAL S-101-97-12 (CABLE) ×2 IMPLANT
CATH CPS LOCATOR 3D MED (CATHETERS) IMPLANT
HELIX LOCKING TOOL (MISCELLANEOUS) ×1
LEAD ULTIPACE 52 LPA1231/52 (Lead) IMPLANT
LEAD ULTIPACE 65 LPA1231/65 (Lead) IMPLANT
PACEMAKER ASSURITY DR-RF (Pacemaker) IMPLANT
PAD DEFIB RADIO PHYSIO CONN (PAD) ×2 IMPLANT
POUCH AIGIS-R ANTIBACT PPM (Mesh General) ×1 IMPLANT
POUCH AIGIS-R ANTIBACT PPM MED (Mesh General) IMPLANT
SHEATH 7FR PRELUDE SNAP 13 (SHEATH) IMPLANT
SHEATH 9FR PRELUDE SNAP 13 (SHEATH) IMPLANT
SLITTER AGILIS HISPRO (INSTRUMENTS) IMPLANT
TOOL HELIX LOCKING (MISCELLANEOUS) IMPLANT
TRAY PACEMAKER INSERTION (PACKS) ×2 IMPLANT
WIRE HI TORQ VERSACORE-J 145CM (WIRE) IMPLANT

## 2022-05-27 NOTE — Progress Notes (Signed)
[place] VALVE TEAM  Patient Name: Haley Trevino Date of Encounter: 05/27/2022  Primary Cardiologist: [name], MD  / Dr. Ali Lowe & Dr. Cyndia Bent (TAVR)     Hospital Problem List     Principal Problem:   S/P TAVR (transcatheter aortic valve replacement) Active Problems:   [name]   Hypertension   Hyperlipidemia with target LDL less than 100   Aortic stenosis   Acute on chronic diastolic heart failure (HCC)   Acute respiratory distress   Anxiety   [name]   Hyperkalemia  Subjective   Main complaint is of diarrhea. Has history of IBS and this is not a new problem for her. Exacerbated by stress. Breathing is doing well.   Inpatient Medications    Scheduled Meds:  acetaminophen  1,000 mg Oral QHS   aspirin  81 mg Oral Daily   Chlorhexidine Gluconate Cloth  6 each Topical Daily   cycloSPORINE  1 drop Both Eyes BID   ezetimibe  10 mg Oral Daily   FLUoxetine  40 mg Oral Daily   fluticasone  2 spray Each Nare Daily   furosemide  20 mg Oral Daily   gentamicin (GARAMYCIN) 80 mg in sodium chloride 0.9 % 500 mL irrigation  80 mg Irrigation On Call   levothyroxine  75 mcg Oral Q0600   NIFEdipine  60 mg Oral Daily   pantoprazole  40 mg Oral Daily   sildenafil  20 mg Oral BID   sodium chloride flush  3 mL Intravenous Q12H   sodium chloride flush  3 mL Intravenous Q12H   Continuous Infusions:  sodium chloride Stopped (05/25/22 1502)   sodium chloride Stopped (05/25/22 1509)   sodium chloride 50 mL/hr at 05/27/22 0604   sodium chloride 250 mL (05/27/22 0601)    ceFAZolin (ANCEF) IV     PRN Meds: sodium chloride, sodium chloride, acetaminophen **OR** acetaminophen, clonazePAM, loperamide, morphine injection, ondansetron (ZOFRAN) IV, mouth rinse, oxyCODONE, sodium chloride, sodium chloride flush, sodium chloride flush, traMADol   Vital Signs    Vitals:   05/27/22 0400 05/27/22 0500  05/27/22 0600 05/27/22 0800  BP: (!) 109/55  (!) 143/69 (!) 166/65  Pulse: 64  67 60  Resp: _0 Temp:   (!) 97.4 F (36.3 C)   TempSrc:   Oral   SpO2: 97%  100% 99%  Weight:  43 kg    Height:        Intake/Output Summary (Last 24 hours) at 05/27/2022 0840 Last data filed at 05/27/2022 0000 Gross per 24 hour  Intake 284.85 ml  Output 200 ml  Net 84.85 ml    Filed Weights   05/25/22 0500 05/26/22 0500 05/27/22 0500  Weight: 44 kg 43.6 kg 43 kg    Physical Exam    GEN: Well nourished, well developed, in no acute distress. Temp pacer in right neck HEENT: Grossly normal.  Neck: Supple, no JVD, or masses. Cardiac: NSR with intermittent pacing.  soft flow murmur, No rubs, or gallops. No clubbing, cyanosis, edema.   Respiratory:  Respirations regular and unlabored Skin: warm and dry, no rash. Extremities: no edema Psych: AAOx3.  Normal affect.  Labs    CBC Recent Labs    05/26/22 0413 05/27/22 0233  WBC 6.2 6.0  HGB 10.1* 9.8*  HCT 30.7* 31.1*  MCV 93.0 95.7  PLT 196 409    Basic Metabolic Panel Recent Labs  05/25/22 0448 05/27/22 0233  NA 140 139  K 3.8 5.0  CL 105 107  CO2 26 23  GLUCOSE 91 90  BUN 16 10  CREATININE 1.15* 0.84  CALCIUM 8.4* 8.5*     Telemetry    A-flutter HR 60s and pacing spikes- Personally Reviewed  ECG    No new - Personally Reviewed  Radiology    No results found.  Cardiac Studies   [facility]  TAVR OPERATIVE NOTE     Date of Procedure:                05/20/2022   Preoperative Diagnosis:      Severe Aortic Stenosis    Postoperative Diagnosis:    Same    Procedure:        Transcatheter Aortic Valve Replacement - Transfemoral Approach             Edwards Sapien 3 Resilia THV (size 50m, model # 9755RLS, serial # [id number])              Co-Surgeons:                         BGaye Pollack MD and ALenna Sciara MD Anesthesiologist:                  WRoderic Palau MD    Echocardiographer:              PJenkins Rouge MD   Pre-operative Echo Findings: Severe aortic stenosis Normal left ventricular systolic function   Post-operative Echo Findings: No paravalvular leak Normal left ventricular systolic function   Left Heart Catheterization Findings: Left ventricular end-diastolic pressure of [id number]  ______________________  Echo - 05/21/2022 1. Left ventricular ejection fraction, by estimation, is 60 to 65%. The  left ventricle has normal function. The left ventricle has no regional  wall motion abnormalities. There is moderate left ventricular hypertrophy.  Left ventricular diastolic  parameters are indeterminate.   2. Right ventricular systolic function is normal. The right ventricular  size is normal. There is normal pulmonary artery systolic pressure.   3. Left atrial size was mildly dilated.   4. Moderate functional MS due to MAC. The mitral valve is degenerative.  Trivial mitral valve regurgitation. Moderate mitral stenosis. Severe  mitral annular calcification.   5. Tricuspid valve regurgitation is moderate.   6. Post TAVR with 23 mm Sapien 3 valve implant 05/20/22 no PVL mean  gradient 11.5 mm peak 24.8 mm DVI 0.74 AVA 2.5 cm2 Trivial central AR .  The aortic valve has been repaired/replaced. Aortic valve regurgitation is  trivial. No aortic stenosis is  present.   7. The inferior vena cava is normal in size with greater than 50%  respiratory variability, suggesting right atrial pressure of 3 mmHg.   Patient Profile     Haley PENDERGRASSis a 81y.o. female with a history of scleroderma and Raynaud's on chronic Revatio therapy, HLD, iron deficiency anemia managed with intermittent iron infusion, chronic diastolic CHF, moderate mitral stenosis due to MAC, and severe AS who [facility]. However, she was found to be hypoxic on anesthesia induction and case was aborted.  She was admitted for acute respiratory  failure and acute anemia.     Assessment & Plan    Severe AS: s/p successful TAVR with a 23 mm Edwards Sapien 3 Ultra Resilia THV via the TF approach  on 05/20/22. Post operative echo 05/21/22 showed EF 60%, normally functioning TAVR with a mean gradient 11.5 and trivial AR. Groin sites are stable. Started on a baby Asprin 81 mg daily. Will stop this now as she is on IV heparin with plans for [facility] given new onset atrial flutter.   CHB: pt had complete heart block after valve deployment. Has a IJ temp wire in place. Currently V paced with underlying atrial flutter. PPM placement canceled 05/22/22 due to hypoxia on the table. Plan for device placement today.She is much improved from a respiratory standpoint. Plan to start anticoagulation once OK with EP  Atrial Flutter: new onset this admission. Rate controlled. Currently on IV heparin. Plan to start a DOAC after PPM.   Acute respiratory failure with hypoxia: completed 5-day course of Zosyn for possible aspiration PNA (n/v during case). Aggressively diuresed. 02 sat improved to 90s on 1L. Continue early mobility flutter and I-S. Up in chair as much as tolerated. Pulm following. Plan HRCT to rule out CREST syndrome as an outpatient. Ambulate once PPM in place.    Acute on chronic diastolic CHF: she has been on IV Lasix. Net neg 4.9L. Weight down 4 lbs (100--> 94.8 lbs.) on Lasix 7m PO.Creat stable.    AKI: creat peaked at 1.45. Down to 0.84 today.    Leukocytosis: WBC up to 18.8 05/21/22. Possibly related to aspiration. Treated with IV abx. Now normal 6.0.      Chronic iron deficiency anemia: follows closely with GI and hematology. She receives Q3 week iron infusions. Transfused 1 unit PRBC on admission. Hg stable at 9.8 today. Continue to monitor   Anxiety: has worsening anxiety during admission and continued on home meds.    Moderate mitral stenosis with MAC: continue to follow with surveillance imaging  H/o Raynaud's & scleroderma: Sildenafil  continued. May need HRCT outpatient.    Signed, Olsen Mccutchan JMartinique MD,    05/27/2022, 8:40 AM

## 2022-05-27 NOTE — Discharge Instructions (Signed)
     Supplemental Discharge Instructions for  Pacemaker/Defibrillator Patients   Activity No heavy lifting or vigorous activity with your left/right arm for 6 to 8 weeks.  Do not raise your left/right arm above your head for one week.  Gradually raise your affected arm as drawn below.             06/02/22                    06/03/22                   06/04/22                  06/05/22 __  NO DRIVING for   1 week you may begin driving on   97/41/63  .  WOUND CARE Keep the wound area clean and dry.  Do not get this area wet , no showers for one week; you may shower on  06/05/22   . The tape/steri-strips on your wound will fall off; do not pull them off.  No bandage is needed on the site.  DO  NOT apply any creams, oils, or ointments to the wound area. If you notice any drainage or discharge from the wound, any swelling or bruising at the site, or you develop a fever > 101? F after you are discharged home, call the office at once.  Special Instructions You are still able to use cellular telephones; use the ear opposite the side where you have your pacemaker/defibrillator.  Avoid carrying your cellular phone near your device. When traveling through airports, show security personnel your identification card to avoid being screened in the metal detectors.  Ask the security personnel to use the hand wand. Avoid arc welding equipment, MRI testing (magnetic resonance imaging), TENS units (transcutaneous nerve stimulators).  Call the office for questions about other devices. Avoid electrical appliances that are in poor condition or are not properly grounded. Microwave ovens are safe to be near or to operate.

## 2022-05-27 NOTE — Progress Notes (Signed)
NAME:  Haley Trevino, MRN:  [id number], DOB:  08-Jun-1941, LOS: 87 ADMISSION DATE:  05/13/2022, CONSULTATION DATE:  05/20/2022 REFERRING MD: Alfredo Martinez -[facility], CHIEF COMPLAINT: Hypoxia post TAVR  History of Present Illness:  81 year old woman who underwent TAVR 11/13.  Significant perioperative hypoxia thought to be due to to pulmonary edema.  She had known aortic stenosis and was seen recently by Dr. Lavonna Monarch for worsening of her symptoms which had progressed to an NYHA class III.  She had a calculated aortic valve area of 1.1 cm and her cath did not show significant coronary artery disease.   Intra-op TAVR course c/b on-going nausea and hypoxia. PCCM asked to see post-operatively for hypoxia   Pertinent  Medical History  Scleroderma, Raynauds on Revatio,  HTN, HL, GERD, IBS, hypothyroidism, chronic diastolic HF, severe AS from MAC.   Significant Hospital Events: Including procedures, antibiotic start and stop dates in addition to other pertinent events   Admitted 11/7 for elective TAVR. Procedure delayed due to respiratory failure and pulm edema 11/14 went for TAVR.  Had sig nausea during procedure w/ increasing oxygen needs. Bradycardia/CBH s/p valve deployment requiring transvenous PM Returned to ICU post TAVR on NRB. PCCM asked to evaluate 11/16 unable to lie supine for PPM. Given diuretic.  11/17 remains ICU with TVP  11/18 O2 requirements are decreasing, heart rate stable.  Anticoagulation held planning for possible device placement Monday. 11/21 permanent pacemaker placement  Interim History / Subjective:  Tmax 98.2  X2 uop, x4 stool  Subjective: Denies chest pain, denies SOB, endorses some shoulder pain from previous rotator cuff injury  Objective   Blood pressure (!) 166/65, pulse 60, temperature 98.2 F (36.8 C), temperature source Oral, resp. rate 16, height _0  (1.575 m), weight 43 kg, SpO2 95 %.        Intake/Output Summary (Last 24 hours) at  05/27/2022 1053 Last data filed at 05/27/2022 0000 Gross per 24 hour  Intake 263.72 ml  Output 100 ml  Net 163.72 ml   Filed Weights   05/25/22 0500 05/26/22 0500 05/27/22 0500  Weight: 44 kg 43.6 kg 43 kg    Examination: General: In bed, NAD, appears comfortable HEENT: MM pink/moist, anicteric, atraumatic Neuro: RASS 0, PERRL 109m, GCS 15 CV: S1S2, paced, no m/r/g appreciated PULM:  clear in the upper lobes, clear in the lower lobes, trachea midline, chest expansion symmetric GI: soft, bsx4 active, non-tender   Extremities: warm/dry, no pretibial edema, capillary refill  than 3 seconds  Skin:  no rashes or lesions noted  Labs/imaging HGB 10.1>9.8 BMP reviewed   Assessment & Plan:  Acute hypoxic respiratory failure suspect pulmonary edema but also consider aspiration.  S/P 5 days of Zosyn -Goal SPO2 92-98%. Wean as tolerated -Continue IS and flutter valve -Mobilize as able  Severe AS now S/p TAVR 11/14;  intra-op course c/b Symptomatic bradycardia and HTN Complete Heart Block s/p TVP S/p permanent pacemaker placement -Continuous tele -Diuretics per cards -AC per cards/surgery post procedure  H/o Raynaud's  H/p scleroderma  Plan: -Continue sildenafil -Consider HRCT as outpatient  Anemia, chronic iron deficiency anemia HGB 10.1>9.8. On iron infusion outpatient. -Ensure renal perfusion. Goal MAP 65 or greater. -Avoid neprotoxic drugs as possible. -Strict I&O's -Follow up AM creatinine  H/o hypothyroidism  Plan -Continue synthroid  Diarrhea -supportive care -monitor I&o -Follow fever/wbc curve   Best Practice (right click and "Reselect all SmartList Selections" daily)   Diet/type: Regular consistency (see orders) DVT prophylaxis: systemic heparin  GI prophylaxis: N/A Lines: N/A Foley:  N/A Code Status:  full code Last date of multidisciplinary goals of care discussion [Will update family ]  Redmond [employer]., MSN, APRN, AGACNP-BC [place]  [facility]  05/27/2022 , 10:53 AM  Please see [email or web address] for pager details  If no response, please call [phone] After hours, please call Elink at [phone]

## 2022-05-27 NOTE — Progress Notes (Signed)
Rounding Note    Patient Name: Haley Trevino Date of Encounter: 05/25/2022  [place] Cardiologist: [name], MD   Subjective   Able to lay near flat yesterday, no CP, not SOB.  Struggles with frequent loos stools chronically, not watery, not particularly changed from her baseline  Inpatient Medications    Scheduled Meds:  acetaminophen  1,000 mg Oral QHS   aspirin  81 mg Oral Daily   Chlorhexidine Gluconate Cloth  6 each Topical Daily   cycloSPORINE  1 drop Both Eyes BID   ezetimibe  10 mg Oral Daily   FLUoxetine  40 mg Oral Daily   fluticasone  2 spray Each Nare Daily   furosemide  40 mg Intravenous BID   levothyroxine  75 mcg Oral Q0600   NIFEdipine  60 mg Oral Daily   mouth rinse  15 mL Mouth Rinse 4 times per day   pantoprazole  40 mg Oral Daily   sildenafil  20 mg Oral BID   sodium chloride flush  3 mL Intravenous Q12H   Continuous Infusions:  sodium chloride Stopped (05/25/22 0504)   sodium chloride 10 mL/hr at 05/25/22 0600   heparin 1,050 Units/hr (05/25/22 0600)   piperacillin-tazobactam (ZOSYN)  IV 12.5 mL/hr at 05/25/22 0600   PRN Meds: sodium chloride, sodium chloride, acetaminophen **OR** acetaminophen, clonazePAM, morphine injection, ondansetron (ZOFRAN) IV, mouth rinse, mouth rinse, oxyCODONE, sodium chloride, sodium chloride flush, traMADol   Vital Signs    Vitals:   05/25/22 0400 05/25/22 0500 05/25/22 0600 05/25/22 0700  BP: (!) 102/48 133/63 (!) 101/47 119/80  Pulse: (!) 56 (!) 55 (!) 56 (!) 55  Resp: 12     Temp: 98.4 F (36.9 C)     TempSrc: Oral     SpO2: 96% 93% 99% 96%  Weight:  44 kg    Height:        Intake/Output Summary (Last 24 hours) at 05/25/2022 0748 Last data filed at 05/25/2022 0600 Gross per 24 hour  Intake 928.46 ml  Output 1525 ml  Net -596.54 ml       05/25/2022    5:00 AM 05/24/2022    5:00 AM 05/23/2022    5:35 AM  Last 3 Weights  Weight (lbs) 97 lb 100 lb 1.4 oz 100 lb 8.5 oz   Weight (kg) 44 kg 45.4 kg 45.6 kg      Telemetry    Intermittent V pacing, remains w/AV dissociation, 1:1 conduction as well. - Personally Reviewed  ECG    No new EKGs  05/21/22 TTE  1. Left ventricular ejection fraction, by estimation, is 60 to 65%. The  left ventricle has normal function. The left ventricle has no regional  wall motion abnormalities. There is moderate left ventricular hypertrophy.  Left ventricular diastolic  parameters are indeterminate.   2. Right ventricular systolic function is normal. The right ventricular  size is normal. There is normal pulmonary artery systolic pressure.   3. Left atrial size was mildly dilated.   4. Moderate functional MS due to MAC. The mitral valve is degenerative.  Trivial mitral valve regurgitation. Moderate mitral stenosis. Severe  mitral annular calcification.   5. Tricuspid valve regurgitation is moderate.   6. Post TAVR with 23 mm Sapien 3 valve implant 05/20/22 no PVL mean  gradient 11.5 mm peak 24.8 mm DVI 0.74 AVA 2.5 cm2 Trivial central AR .  The aortic valve has been repaired/replaced. Aortic valve regurgitation is  trivial. No aortic stenosis is  present.   7. The inferior vena cava is normal in size with greater than 50%  respiratory variability, suggesting right atrial pressure of 3 mmHg.   Physical Exam   Exam remains essentially unchanged GEN: No acute distress.  Frail. Elderly.  Neck: No JVD, TVP R IJ Cardiac: RRR, no murmurs, rubs, or gallops.  Respiratory: CTA b/l GI: Soft, nontender, non-distended  MS: No edema; No deformity, advanced atrophy Neuro:  Nonfocal  Psych: Normal affect   Labs    High Sensitivity Troponin:  No results for input(s): "TROPONINIHS" in the last 720 hours.   Chemistry Recent Labs  Lab 05/21/22 0329 05/22/22 0453 05/23/22 0920 05/24/22 0555 05/25/22 0448  NA 136   < > 137 138 140  K 4.3   < > 3.3* 4.8 3.8  CL 104   < > 103 104 105  CO2 22   < > _0 GLUCOSE 147*    < > 136* 104* 91  BUN 24*   < > _1 CREATININE 1.45*   < > 1.22* 1.30* 1.15*  CALCIUM 8.7*   < > 8.3* 8.5* 8.4*  MG 2.0  --   --   --   --   GFRNONAA 36*   < > 45* 42* 48*  ANIONGAP 10   < > _2 < > = values in this interval not displayed.     Lipids No results for input(s): "CHOL", "TRIG", "HDL", "LABVLDL", "LDLCALC", "CHOLHDL" in the last 168 hours.  Hematology Recent Labs  Lab 05/23/22 0914 05/24/22 0555 05/25/22 0448  WBC 9.5 7.1 5.6  RBC 3.01* 3.08* 3.10*  HGB 8.8* 9.4* 9.1*  HCT 28.9* 29.0* 29.4*  MCV 96.0 94.2 94.8  MCH 29.2 30.5 29.4  MCHC 30.4 32.4 31.0  RDW 16.0* 16.2* 15.9*  PLT 152 165 194    Thyroid No results for input(s): "TSH", "FREET4" in the last 168 hours.  BNP Recent Labs  Lab 05/23/22 0920 05/24/22 0555 05/25/22 0448  BNP 1,540.5* 1,698.0* 1,646.1*     DDimer No results for input(s): "DDIMER" in the last 168 hours.   Radiology    DG CHEST PORT 1 VIEW  Result Date: 05/23/2022 CLINICAL DATA:  SOB s/p TAVR EXAM: PORTABLE CHEST 1 VIEW COMPARISON:  05/22/22 FINDINGS: Status post TAVR. Unchanged transvenous pacing device. Unchanged kyphoplasty changes. Aortic atherosclerotic calcifications. Possible trace left-sided pleural effusion. Compared to prior exam is new hazy pulmonary opacity at the right lung base. No pneumothorax. No displaced rib fracture. Visualized upper abdomen is unremarkable. IMPRESSION: Compared to prior exam there is a new hazy pulmonary opacity at the right lung base, which may represent atelectasis, but infection is not excluded. Electronically Signed   By: Marin Roberts M.D.   On: 05/23/2022 08:27      Assessment & Plan     81/y/o Female w/PMHx of Scleroderma, Raynauds on Revatio,  HTN, HL, GERD, IBS, hypothyroidism, chronic diastolic HF, severe AS from MAC.   Admitted 11/7 for elective TAVR. Procedure delayed due to respiratory failure and pulm edema 11/14 went for TAVR.  Had sig nausea during procedure w/  increasing oxygen needs. Bradycardia/CBH s/p valve deployment requiring transvenous PM Returned to ICU post TAVR on NRB. PCCM asked to evaluate 11/16 unable to lie supine for PPM. Given diuretic.  11/17 remains ICU with TVP  11/18 O2 requirements are decreasing  -----------------------------------------------------------------------------   CHB C/w AV dissociation and intermittent V pacing For PPM today She  remains agreeable   2.  AFL New for her Heparin gtt > off for pacer Plan timing of [place] pending pocket stability    3.  AS S/p TAVR 11/14 Delayed initially 2/2 respiratory insufficiency    4. . Hypoxia Pulmonary/CCM is  following. Completed 5 days of Zosyn Much improved respiratory status L n/c this AM  Sildenafil continued May need HRCT outpatient w/hx of scleroderma/Raynaud  5. Diarrhea Not liquid, but frequent soft stools Known for her for years apparently    For questions or updates, please contact [place] Please consult [email or web address] for contact info under        Signed, Vickie Epley, MD  05/25/2022, 7:48 AM

## 2022-05-28 ENCOUNTER — Inpatient Hospital Stay (HOSPITAL_COMMUNITY): Payer: Medicare Other

## 2022-05-28 ENCOUNTER — Ambulatory Visit: Payer: Medicare Other

## 2022-05-28 DIAGNOSIS — I350 Nonrheumatic aortic (valve) stenosis: Secondary | ICD-10-CM | POA: Diagnosis not present

## 2022-05-28 DIAGNOSIS — I5033 Acute on chronic diastolic (congestive) heart failure: Secondary | ICD-10-CM | POA: Diagnosis not present

## 2022-05-28 DIAGNOSIS — Z952 Presence of prosthetic heart valve: Secondary | ICD-10-CM | POA: Diagnosis not present

## 2022-05-28 DIAGNOSIS — I442 Atrioventricular block, complete: Secondary | ICD-10-CM | POA: Diagnosis not present

## 2022-05-28 LAB — BASIC METABOLIC PANEL
Anion gap: 6 (ref 5–15)
Anion gap: 7 (ref 5–15)
BUN: 11 mg/dL (ref 8–23)
BUN: 9 mg/dL (ref 8–23)
CO2: 24 mmol/L (ref 22–32)
CO2: 27 mmol/L (ref 22–32)
Calcium: 7.7 mg/dL — ABNORMAL LOW (ref 8.9–10.3)
Calcium: 8.6 mg/dL — ABNORMAL LOW (ref 8.9–10.3)
Chloride: 107 mmol/L (ref 98–111)
Chloride: 104 mmol/L (ref 98–111)
Creatinine, Ser: 0.75 mg/dL (ref 0.44–1.00)
Creatinine, Ser: 0.91 mg/dL (ref 0.44–1.00)
GFR, Estimated: >60 mL/min (ref >60)
GFR, Estimated: >60 mL/min (ref >60)
Glucose, Bld: 88 mg/dL (ref 70–99)
Glucose, Bld: 119 mg/dL — ABNORMAL HIGH (ref 70–99)
Potassium: 6.4 mmol/L (ref 3.5–5.1)
Potassium: 2.6 mmol/L — CL (ref 3.5–5.1)
Sodium: 137 mmol/L (ref 135–145)
Sodium: 138 mmol/L (ref 135–145)

## 2022-05-28 LAB — PHOSPHORUS: Phosphorus: 2.4 mg/dL — ABNORMAL LOW (ref 2.5–4.6)

## 2022-05-28 LAB — MAGNESIUM: Magnesium: 1.8 mg/dL (ref 1.7–2.4)

## 2022-05-28 MED ORDER — CALCIUM GLUCONATE 10 % IV SOLN: 1.0000 g | Freq: Once | INTRAVENOUS | Status: DC

## 2022-05-28 MED ORDER — INSULIN ASPART 100 UNIT/ML IV SOLN
10.0000 [IU] | Freq: Once | INTRAVENOUS | Status: AC
  Administered 2022-05-28: 10 [IU] via INTRAVENOUS

## 2022-05-28 MED ORDER — POTASSIUM CHLORIDE CRYS ER 20 MEQ PO TBCR
40.0000 meq | EXTENDED_RELEASE_TABLET | Freq: Once | ORAL | Status: AC
  Administered 2022-05-28: 40 meq via ORAL
  Filled 2022-05-28: qty 2

## 2022-05-28 MED ORDER — SODIUM BICARBONATE 8.4 % IV SOLN
100.0000 meq | Freq: Once | INTRAVENOUS | Status: AC
  Administered 2022-05-28: 100 meq via INTRAVENOUS
  Filled 2022-05-28: qty 50

## 2022-05-28 MED ORDER — POTASSIUM CHLORIDE 10 MEQ/100ML IV SOLN
10.0000 meq | INTRAVENOUS | Status: AC
  Administered 2022-05-28 (×4): 10 meq via INTRAVENOUS
  Filled 2022-05-28 (×4): qty 100

## 2022-05-28 MED ORDER — DEXTROSE 50 % IV SOLN
1.0000 | Freq: Once | INTRAVENOUS | Status: AC
  Administered 2022-05-28: 50 mL via INTRAVENOUS
  Filled 2022-05-28: qty 50

## 2022-05-28 MED ORDER — SODIUM ZIRCONIUM CYCLOSILICATE 10 G PO PACK
10.0000 g | PACK | Freq: Once | ORAL | Status: AC
  Administered 2022-05-28: 10 g via ORAL
  Filled 2022-05-28: qty 1

## 2022-05-28 MED ORDER — CALCIUM GLUCONATE-NACL 1-0.675 GM/50ML-% IV SOLN
1.0000 g | Freq: Once | INTRAVENOUS | Status: AC
  Administered 2022-05-28: 1000 mg via INTRAVENOUS
  Filled 2022-05-28: qty 50

## 2022-05-28 MED ORDER — SODIUM BICARBONATE 8.4 % IV SOLN
INTRAVENOUS | Status: AC
  Filled 2022-05-28: qty 50

## 2022-05-28 NOTE — Progress Notes (Addendum)
SATURATION QUALIFICATIONS: (This note is used to comply with regulatory documentation for home oxygen)  Patient Saturations on Room Air at Rest = 85%  Patient Saturations on Room Air while Ambulating = n/a%  Patient Saturations on 3 Liters of oxygen while Ambulating = 87%  Please briefly explain why patient needs home oxygen:  Pt SpO2 stable at rest on 1L however desat to 85 RA. Attempted 2L ambulating however down to 87 2L. Increased to 3L during slow walk and pt maintained SpO2 however upon sitting SpO2 down to 87 3L. At a quicker pace or increased exertion pt will more than likely need 4L however this was not tested today. Yves Dill BS, ACSM-CEP 05/28/2022 11:19 AM

## 2022-05-28 NOTE — Progress Notes (Signed)
Rounding Note    Patient Name: Haley Trevino Date of Encounter: 05/28/2022  [place] Cardiologist: [name], MD   Subjective   Feeling well, minimal site discomfort  Inpatient Medications    Scheduled Meds:  acetaminophen  1,000 mg Oral QHS   aspirin  81 mg Oral Daily   Chlorhexidine Gluconate Cloth  6 each Topical Daily   cycloSPORINE  1 drop Both Eyes BID   ezetimibe  10 mg Oral Daily   feeding supplement  237 mL Oral BID BM   FLUoxetine  40 mg Oral Daily   fluticasone  2 spray Each Nare Daily   furosemide  20 mg Oral Daily   levothyroxine  75 mcg Oral Q0600   NIFEdipine  60 mg Oral Daily   pantoprazole  40 mg Oral Daily   sildenafil  20 mg Oral BID   sodium bicarbonate       sodium chloride flush  3 mL Intravenous Q12H   sodium chloride flush  3 mL Intravenous Q12H   Continuous Infusions:  sodium chloride Stopped (05/25/22 1502)   sodium chloride Stopped (05/25/22 1509)   PRN Meds: sodium chloride, sodium chloride, acetaminophen **OR** acetaminophen, clonazePAM, morphine injection, ondansetron (ZOFRAN) IV, mouth rinse, oxyCODONE, sodium bicarbonate, sodium chloride, sodium chloride flush, traMADol   Vital Signs    Vitals:   05/28/22 0500 05/28/22 0600 05/28/22 0700 05/28/22 0800  BP: (!) 133/51 (!) 143/53  134/67  Pulse: 64 66 76 71  Resp: _0 (!) 21  Temp:      TempSrc:      SpO2: 92% 91% 92% 93%  Weight:      Height:        Intake/Output Summary (Last 24 hours) at 05/28/2022 1008 Last data filed at 05/28/2022 0900 Gross per 24 hour  Intake 200 ml  Output 650 ml  Net -450 ml      05/27/2022    5:00 AM 05/26/2022    5:00 AM 05/25/2022    5:00 AM  Last 3 Weights  Weight (lbs) 94 lb 12.8 oz 96 lb 1.9 oz 97 lb  Weight (kg) 43 kg 43.6 kg 44 kg      Telemetry    Intermittent V pacing, remains w/AV dissociation, 1:1 conduction as well. - Personally Reviewed  ECG    No new EKGs  05/21/22 TTE  1. Left  ventricular ejection fraction, by estimation, is 60 to 65%. The  left ventricle has normal function. The left ventricle has no regional  wall motion abnormalities. There is moderate left ventricular hypertrophy.  Left ventricular diastolic  parameters are indeterminate.   2. Right ventricular systolic function is normal. The right ventricular  size is normal. There is normal pulmonary artery systolic pressure.   3. Left atrial size was mildly dilated.   4. Moderate functional MS due to MAC. The mitral valve is degenerative.  Trivial mitral valve regurgitation. Moderate mitral stenosis. Severe  mitral annular calcification.   5. Tricuspid valve regurgitation is moderate.   6. Post TAVR with 23 mm Sapien 3 valve implant 05/20/22 no PVL mean  gradient 11.5 mm peak 24.8 mm DVI 0.74 AVA 2.5 cm2 Trivial central AR .  The aortic valve has been repaired/replaced. Aortic valve regurgitation is  trivial. No aortic stenosis is  present.   7. The inferior vena cava is normal in size with greater than 50%  respiratory variability, suggesting right atrial pressure of 3 mmHg.   Physical Exam  GEN: No acute distress.  Frail. Elderly.  Neck: No JVD Cardiac: RRR, no murmurs, rubs, or gallops.  Respiratory: CTA b/l GI: Soft, nontender, non-distended  MS: No edema; No deformity, advanced atrophy Neuro:  Nonfocal  Psych: Normal affect   PPM site is stable, no bleeding, no hematoma  Labs    High Sensitivity Troponin:  No results for input(s): "TROPONINIHS" in the last 720 hours.   Chemistry Recent Labs  Lab 05/25/22 0448 05/27/22 0233 05/28/22 0251  NA 140 139 137  K 3.8 5.0 6.4*  CL 105 107 107  CO2 _0 GLUCOSE 91 90 88  BUN _1 CREATININE 1.15* 0.84 0.75  CALCIUM 8.4* 8.5* 7.7*  GFRNONAA 48* >60 >60  ANIONGAP _2 Lipids No results for input(s): "CHOL", "TRIG", "HDL", "LABVLDL", "LDLCALC", "CHOLHDL" in the last 168 hours.  Hematology Recent Labs  Lab  05/25/22 0448 05/26/22 0413 05/27/22 0233  WBC 5.6 6.2 6.0  RBC 3.10* 3.30* 3.25*  HGB 9.1* 10.1* 9.8*  HCT 29.4* 30.7* 31.1*  MCV 94.8 93.0 95.7  MCH 29.4 30.6 30.2  MCHC 31.0 32.9 31.5  RDW 15.9* 16.0* 16.2*  PLT 194 196 241   Thyroid No results for input(s): "TSH", "FREET4" in the last 168 hours.  BNP Recent Labs  Lab 05/23/22 0920 05/24/22 0555 05/25/22 0448  BNP 1,540.5* 1,698.0* 1,646.1*    DDimer No results for input(s): "DDIMER" in the last 168 hours.   Radiology    DG Chest 2 View  Result Date: 05/28/2022 CLINICAL DATA:  Shortness of breath. EXAM: CHEST - 2 VIEW COMPARISON:  May 23, 2022. FINDINGS: Stable cardiomediastinal silhouette. Status post transcatheter aortic valve repair. Interval placement of left-sided pacemaker with leads in grossly good position. Mild bibasilar pulmonary edema is noted with small bilateral pleural effusions. Status post kyphoplasty at multiple levels of the thoracic spine. IMPRESSION: Interval placement of left-sided pacemaker with leads in grossly good position. Mild bibasilar pulmonary edema is noted with small bilateral pleural effusions. Electronically Signed   By: Marijo Conception M.D.   On: 05/28/2022 08:35   EP PPM/ICD IMPLANT  Result Date: 05/27/2022  CONCLUSIONS:  1. Severe AS s/p TAVR c/b complete heart block  2. Dual chamber permanent pacemaker with left bundle area lead  3.  No early apparent complications.      Assessment & Plan     81/y/o Female w/PMHx of Scleroderma, Raynauds on Revatio,  HTN, HL, GERD, IBS, hypothyroidism, chronic diastolic HF, severe AS from MAC.   Admitted 11/7 for elective TAVR. Procedure delayed due to respiratory failure and pulm edema 11/14 went for TAVR.  Had sig nausea during procedure w/ increasing oxygen needs. Bradycardia/CBH s/p valve deployment requiring transvenous PM Returned to ICU post TAVR on NRB. PCCM asked to evaluate 11/16 unable to lie supine for PPM. Given diuretic.  11/17  remains ICU with TVP  11/18 O2 requirements are decreasing  -----------------------------------------------------------------------------   CHB C/w AV dissociation and intermittent V pacing  S/p PPM yesterday with Dr. Quentin Ore Site is stable Device check this AM stable CXR this Am without PTX Wound care and activity restrictions reviewed with the patient and her husband at bedside Usual EP follow up is in place  2.  AFL New for her Heparin gtt > off for pacer Decision of [place] agent as per attending cardiology team  Please wait 5 days before starting [place]    3.  AS S/p TAVR 11/14 Delayed  initially 2/2 respiratory insufficiency    4. . Hypoxia Pulmonary/CCM is  following. Completed 5 days of Zosyn Much improved respiratory status Sildenafil continued May need HRCT outpatient w/hx of scleroderma/Raynaud  OFF O2 this AM while we are with her  5. Diarrhea Not liquid, but frequent soft stools Known for her for years apparently  6. Hyperkalemia As per attending teams   Dr. Quentin Ore has seen the patient EP will sign off though remain available, please recall if needed   For questions or updates, please contact [place] Please consult [email or web address] for contact info under        Signed, Baldwin [place], PA-C  05/28/2022, 10:08 AM

## 2022-05-28 NOTE — Discharge Summary (Signed)
[place] VALVE TEAM  Discharge Summary    Patient ID: Haley Trevino MRN: [id number]; DOB: 10-17-1940  Admit date: 05/13/2022 Discharge date: 05/29/2022  Primary Care Provider: [name], FNP  Primary Cardiologist:  [name], MD Structural Heart: Dr. Ali Lowe & Dr. Cyndia Bent (TAVR)      Discharge Diagnoses    Principal Problem:   S/P TAVR (transcatheter aortic valve replacement) Active Problems:   [name]   Hypertension   Hyperlipidemia with target LDL less than 100   Nonrheumatic aortic valve stenosis   Acute on chronic diastolic heart failure (HCC)   Acute respiratory distress   Anxiety   [name]   Hyperkalemia   Heart block AV complete (HCC)   Allergies Allergies  Allergen Reactions   Bactrim [Sulfamethoxazole-Trimethoprim] Nausea And Vomiting   Dilaudid [Hydromorphone Hcl] Anaphylaxis   Dilaudid [Hydromorphone] Anaphylaxis   Acyclovir And Related     Unknown reaction   Crestor [Rosuvastatin Calcium] Other (See Comments)    Muscle pain   Gabapentin Swelling    Swelling in feet, and in legs   Aleve [Naproxen Sodium] Rash   Lyrica [Pregabalin] Other (See Comments)    Swelling in feet, and in legs    Diagnostic Studies/Procedures    [facility]  TAVR OPERATIVE NOTE     Date of Procedure:                05/20/2022   Preoperative Diagnosis:      Severe Aortic Stenosis    Postoperative Diagnosis:    Same    Procedure:        Transcatheter Aortic Valve Replacement - Transfemoral Approach             Edwards Sapien 3 Resilia THV (size 70m, model # 9755RLS, serial # [id number])              Co-Surgeons:                         BGaye Pollack MD and ALenna Sciara MD Anesthesiologist:                  WRoderic Palau MD   Echocardiographer:              PJenkins Rouge MD   Pre-operative Echo Findings: Severe aortic stenosis Normal left  ventricular systolic function   Post-operative Echo Findings: No paravalvular leak Normal left ventricular systolic function   Left Heart Catheterization Findings: Left ventricular end-diastolic pressure of 110CHEN    Echo: 05/21/2022 1. Left ventricular ejection fraction, by estimation, is 60 to 65%. The  left ventricle has normal function. The left ventricle has no regional  wall motion abnormalities. There is moderate left ventricular hypertrophy.  Left ventricular diastolic  parameters are indeterminate.   2. Right ventricular systolic function is normal. The right ventricular  size is normal. There is normal pulmonary artery systolic pressure.   3. Left atrial size was mildly dilated.   4. Moderate functional MS due to MAC. The mitral valve is degenerative.  Trivial mitral valve regurgitation. Moderate mitral stenosis. Severe  mitral annular calcification.   5. Tricuspid valve regurgitation is moderate.   6. Post TAVR with 23 mm Sapien 3 valve implant 05/20/22 no PVL mean  gradient 11.5 mm peak 24.8 mm DVI 0.74 AVA 2.5 cm2 Trivial central AR .  The aortic valve has been repaired/replaced. Aortic valve  regurgitation is  trivial. No aortic stenosis is  present.   7. The inferior vena cava is normal in size with greater than 50%  respiratory variability, suggesting right atrial pressure of 3 mmHg.    Pacemaker Placement: 07/27/2021   CONCLUSIONS:  1. Severe AS s/p TAVR c/b complete heart block 2. Dual chamber permanent pacemaker with left bundle area lead 3. No early apparent complications.    History of Present Illness    Haley Trevino is a 81 y.o. female with a past medical history of scleroderma and Raynaud's (on chronic Revatio therapy), HLD, iron deficiency anemia (managed with intermittent iron infusions), HFpEF and moderate mitral stenosis due to MAC and severe AS who presented to [facility] on 05/13/22 for planned TAVR.   She had been followed closely as an  outpatient by the Structural Heart Team as echocardiogram in 03/2022 showed EF >75%, mod concentric LVH, and severe AS with a mean grad 48 mmHg, peak grad 76.9 mmHg, AVA 1.01 cm2, DVI 0.36, SVI 78, as well as severe MAC with moderate MS. Underwent L/RHC in 03/2022 which showed minimal obstructive coronary artery disease.   Upon arrival for planned TAVR, she was found to be hypoxic on anesthesia induction and case was aborted with her being admitted for further management of acute hypoxic respiratory failure and acute anemia.    Hospital Course     Consultants: PCCM, EP   Severe AS: While initial TAVR was aborted on 05/13/2022, she underwent successful TAVR on 05/20/2022 with a 23 mm Edwards Sapien 3 Ultra Resilia THV via the TF approach. Post operative echo on 05/21/22 showed EF 60% and normally functioning TAVR with a mean gradient 11.5 and trivial AR. Groin sites were stable following her procedure. She does have a TOC visit with the Structural Heart Team on 06/06/2022.   CHB: She developed complete heart block after valve deployment. Had a IJ temp wire placed post-procedure 05/20/22. PPM placement canceled 05/22/22 due to hypoxia on the table. Ultimately underwent PPM placement with an Assurity MRI on 05/27/2022 by Dr. Quentin Ore. Post-procedure CXR showed no evidence of a pneumothorax. Post-hospital wound check appointment was arranged by the EP Team for 06/12/2022.  Atrial Flutter: New onset this admission but overall rate-controlled. Was recommended by EP to restart DOAC five days post-procedure from PPM placement. Given age and weight (105 lbs), will be on Eliquis 2.23m BID.    Acute respiratory failure with hypoxia: [facility] and was found to be hypoxic on anesthesia induction with O2 saturations in the 70's. Case was aborted and bedside echocardiogram showed no significant change from prior imaging.Patient initally required HFNC and was weaned  down to RA after diuresis with IV Lasix. She then had two more episodes of acute hypoxia requiring HFNC which were treated with IV Lasix and antibiotics for possible aspiration PNA. There was concern for intersitial lung disease/CREST syndrome and it was recommended to pursue HRCT as an outpatient. She was able to wean down to RA at rest but still requiring supplemental   Acute on chronic HFpEF: She responded well to IV Lasix during admission with a recorded net output of -6.0 L and was switch to PO Lasix 239mdaily at discharge. Will need a repeat BMET at the time of outpatient follow-up.    AKI: Creatinine peaked at 1.45 during admission and had returned to baseline. At 0.92 at the time of discharge.    Leukocytosis: WBC trended up to 18.8 this admission  and likely related to aspiration PNA. Normalized prior to discharge.      Chronic Iron Deficiency Anemia: Follows closely with GI and Hematology as an outpatient with routine iron infusions. Transfused 1 unit PRBC on admission. Hg stable at 9.8 on 05/27/2022.    Anxiety: Was continued on home medications throughout admission.     Moderate mitral stenosis with MAC: Continue to follow with surveillance imaging as an outpatient.    H/o Raynaud's & scleroderma: Sildenafil continued. May need HRCT as an outpatient for suspected CREST syndrome.    Hyperkalemia: K+ trended up to 6.4 on 05/28/2022 and she received Lokelma, D50, and insulin. Normalized to 3.6 on 05/29/2022. Will need repeat BMET at [facility] Visit.    Hypocalcemia: Calcium at 7.7 on 05/2021, at 8.7 the day of discharge. Repeat BMET as outpatient.  _____________  Discharge Vitals Blood pressure (!) 147/58, pulse 71, temperature 98.2 F (36.8 C), temperature source Axillary, resp. rate 20, height _0  (1.575 m), weight 47.8 kg, SpO2 90 %.  Filed Weights   05/26/22 0500 05/27/22 0500 05/29/22 0456  Weight: 43.6 kg 43 kg 47.8 kg    Labs & Radiologic Studies    CBC Recent Labs     05/27/22 0233  WBC 6.0  HGB 9.8*  HCT 31.1*  MCV 95.7  PLT 662   Basic Metabolic Panel Recent Labs    05/28/22 1531 05/29/22 0048  NA 138 140  K 2.6* 3.6  CL 104 103  CO2 27 26  GLUCOSE 119* 104*  BUN 9 7*  CREATININE 0.91 0.92  CALCIUM 8.6* 8.7*  MG 1.8 1.7  PHOS 2.4*  --    _____________  DG Chest 2 View  Result Date: 05/28/2022 CLINICAL DATA:  Shortness of breath. EXAM: CHEST - 2 VIEW COMPARISON:  May 23, 2022. FINDINGS: Stable cardiomediastinal silhouette. Status post transcatheter aortic valve repair. Interval placement of left-sided pacemaker with leads in grossly good position. Mild bibasilar pulmonary edema is noted with small bilateral pleural effusions. Status post kyphoplasty at multiple levels of the thoracic spine. IMPRESSION: Interval placement of left-sided pacemaker with leads in grossly good position. Mild bibasilar pulmonary edema is noted with small bilateral pleural effusions. Electronically Signed   By: Marijo Conception M.D.   On: 05/28/2022 08:35   EP PPM/ICD IMPLANT  Result Date: 05/27/2022  CONCLUSIONS:  1. Severe AS s/p TAVR c/b complete heart block  2. Dual chamber permanent pacemaker with left bundle area lead  3.  No early apparent complications.   DG CHEST PORT 1 VIEW  Result Date: 05/23/2022 CLINICAL DATA:  SOB s/p TAVR EXAM: PORTABLE CHEST 1 VIEW COMPARISON:  05/22/22 FINDINGS: Status post TAVR. Unchanged transvenous pacing device. Unchanged kyphoplasty changes. Aortic atherosclerotic calcifications. Possible trace left-sided pleural effusion. Compared to prior exam is new hazy pulmonary opacity at the right lung base. No pneumothorax. No displaced rib fracture. Visualized upper abdomen is unremarkable. IMPRESSION: Compared to prior exam there is a new hazy pulmonary opacity at the right lung base, which may represent atelectasis, but infection is not excluded. Electronically Signed   By: Marin Roberts M.D.   On: 05/23/2022 08:27   ABORTED  INVASIVE LAB PROCEDURE  Result Date: 05/23/2022 This case was aborted.  DG CHEST PORT 1 VIEW  Result Date: 05/22/2022 CLINICAL DATA:  Status post TAVR EXAM: PORTABLE CHEST 1 VIEW COMPARISON:  Chest x-ray dated May 20, 2022 FINDINGS: Cardiac and mediastinal contours are within normal limits status post TAVR. Transvenous pacing line is unchanged  when compared with the prior exam. Mild bilateral interstitial opacities. Bibasilar atelectasis. No evidence of pneumothorax. IMPRESSION: Similar mild pulmonary edema and bibasilar atelectasis. Electronically Signed   By: Yetta Glassman M.D.   On: 05/22/2022 12:38   ECHOCARDIOGRAM COMPLETE  Result Date: 05/21/2022    ECHOCARDIOGRAM REPORT   Patient Name:   Haley Trevino Date of Exam: 05/21/2022 Medical Rec #:  [id number]        Height:       62.0 in Accession #:    [id number]       Weight:       99.4 lb Date of Birth:  03-06-1941        BSA:          1.420 m Patient Age:    88 years         BP:           114/80 mmHg Patient Gender: F                HR:           60 bpm. Exam Location:  Inpatient Procedure: 2D Echo Indications:    Post TAVR evaluation  History:        Patient has prior history of Echocardiogram examinations, most                 recent 05/20/2022. Mitral Valve Disease; Risk                 Factors:Hypertension and Dyslipidemia. 23 mm Sapien prosthetic,                 stented (TAVR)                 valve is present in the aortic position. Procedure Date:                 05/20/2022.  Sonographer:    Clayton Lefort RDCS (AE) Referring Phys: Eileen Stanford IMPRESSIONS  1. Left ventricular ejection fraction, by estimation, is 60 to 65%. The left ventricle has normal function. The left ventricle has no regional wall motion abnormalities. There is moderate left ventricular hypertrophy. Left ventricular diastolic parameters are indeterminate.  2. Right ventricular systolic function is normal. The right ventricular size is normal. There is normal  pulmonary artery systolic pressure.  3. Left atrial size was mildly dilated.  4. Moderate functional MS due to MAC. The mitral valve is degenerative. Trivial mitral valve regurgitation. Moderate mitral stenosis. Severe mitral annular calcification.  5. Tricuspid valve regurgitation is moderate.  6. Post TAVR with 23 mm Sapien 3 valve implant 05/20/22 no PVL mean gradient 11.5 mm peak 24.8 mm DVI 0.74 AVA 2.5 cm2 Trivial central AR . The aortic valve has been repaired/replaced. Aortic valve regurgitation is trivial. No aortic stenosis is present.  7. The inferior vena cava is normal in size with greater than 50% respiratory variability, suggesting right atrial pressure of 3 mmHg. FINDINGS  Left Ventricle: Left ventricular ejection fraction, by estimation, is 60 to 65%. The left ventricle has normal function. The left ventricle has no regional wall motion abnormalities. The left ventricular internal cavity size was normal in size. There is  moderate left ventricular hypertrophy. Left ventricular diastolic parameters are indeterminate. Right Ventricle: The right ventricular size is normal. No increase in right ventricular wall thickness. Right ventricular systolic function is normal. There is normal pulmonary artery systolic pressure. The tricuspid regurgitant velocity is 2.76 m/s, and  with an assumed  right atrial pressure of 3 mmHg, the estimated right ventricular systolic pressure is 32.3 mmHg. Left Atrium: Left atrial size was mildly dilated. Right Atrium: Right atrial size was normal in size. Pericardium: There is no evidence of pericardial effusion. Mitral Valve: Moderate functional MS due to MAC. The mitral valve is degenerative in appearance. There is moderate thickening of the mitral valve leaflet(s). There is moderate calcification of the mitral valve leaflet(s). Severe mitral annular calcification. Trivial mitral valve regurgitation. Moderate mitral valve stenosis. MV peak gradient, 15.4 mmHg. The mean  mitral valve gradient is 7.0 mmHg. Tricuspid Valve: The tricuspid valve is normal in structure. Tricuspid valve regurgitation is moderate . No evidence of tricuspid stenosis. Aortic Valve: Post TAVR with 23 mm Sapien 3 valve implant 05/20/22 no PVL mean gradient 11.5 mm peak 24.8 mm DVI 0.74 AVA 2.5 cm2 Trivial central AR. The aortic valve has been repaired/replaced. Aortic valve regurgitation is trivial. No aortic stenosis is present. Aortic valve mean gradient measures 11.5 mmHg. Aortic valve peak gradient measures 24.8 mmHg. Aortic valve area, by VTI measures 2.57 cm. Pulmonic Valve: The pulmonic valve was normal in structure. Pulmonic valve regurgitation is trivial. No evidence of pulmonic stenosis. Aorta: The aortic root is normal in size and structure. Venous: The inferior vena cava is normal in size with greater than 50% respiratory variability, suggesting right atrial pressure of 3 mmHg. IAS/Shunts: No atrial level shunt detected by color flow Doppler.  LEFT VENTRICLE PLAX 2D LVIDd:         3.40 cm LVIDs:         2.20 cm LV PW:         1.30 cm LV IVS:        1.60 cm LVOT diam:     2.10 cm LV SV:         119 LV SV Index:   84 LVOT Area:     3.46 cm  RIGHT VENTRICLE             IVC RV Basal diam:  2.90 cm     IVC diam: 1.50 cm RV S prime:     13.20 cm/s TAPSE (M-mode): 2.1 cm LEFT ATRIUM             Index        RIGHT ATRIUM           Index LA diam:        3.60 cm 2.53 cm/m   RA Area:     17.60 cm LA Vol (A2C):   43.7 ml 30.77 ml/m  RA Volume:   43.30 ml  30.48 ml/m LA Vol (A4C):   97.9 ml 68.92 ml/m LA Biplane Vol: 66.9 ml 47.10 ml/m  AORTIC VALVE AV Area (Vmax):    2.25 cm AV Area (Vmean):   2.26 cm AV Area (VTI):     2.57 cm AV Vmax:           249.00 cm/s AV Vmean:          153.000 cm/s AV VTI:            0.464 m AV Peak Grad:      24.8 mmHg AV Mean Grad:      11.5 mmHg LVOT Vmax:         162.00 cm/s LVOT Vmean:        100.000 cm/s LVOT VTI:          0.344 m LVOT/AV VTI ratio: 0.74  AORTA Ao Root  diam: 2.40 cm MITRAL VALVE              TRICUSPID VALVE MV Area VTI:  1.49 cm    TR Peak grad:   30.5 mmHg MV Peak grad: 15.4 mmHg   TR Vmax:        276.00 cm/s MV Mean grad: 7.0 mmHg MV Vmax:      1.96 m/s    SHUNTS MV Vmean:     126.0 cm/s  Systemic VTI:  0.34 m                           Systemic Diam: 2.10 cm Jenkins Rouge MD Electronically signed by Jenkins Rouge MD Signature Date/Time: 05/21/2022/11:47:08 AM    Final    DG Chest Port 1 View  Result Date: 05/20/2022 CLINICAL DATA:  Vomiting. EXAM: PORTABLE CHEST 1 VIEW COMPARISON:  May 17, 2022. FINDINGS: Stable cardiomegaly. Mild central pulmonary vascular congestion is noted with probable minimal bilateral pulmonary edema. Status post kyphoplasty at multiple levels in the midthoracic spine. Status post transcatheter aortic valve repair. Right internal jugular pacing lead is noted with tip in expected position of right ventricle. IMPRESSION: Stable cardiomegaly with mild central pulmonary vascular congestion and possible minimal bilateral pulmonary edema. Status post transcatheter aortic valve repair. Electronically Signed   By: Marijo Conception M.D.   On: 05/20/2022 15:42   Structural Heart Procedure  Result Date: 05/20/2022 See surgical note for result.  ECHOCARDIOGRAM LIMITED  Result Date: 05/20/2022    ECHOCARDIOGRAM LIMITED REPORT   Patient Name:   Haley Trevino Date of Exam: 05/20/2022 Medical Rec #:  [id number]          Height:       62.0 in Accession #:    [id number]         Weight:       95.2 lb Date of Birth:  May 03, 1941          BSA:          1.395 m Patient Age:    59 years           BP:           185/65 mmHg Patient Gender: F                  HR:           69 bpm. Exam Location:  Inpatient Procedure: Limited Echo, Cardiac Doppler and Color Doppler Indications:    Aortic valve disorder I35.9  History:        Patient has prior history of Echocardiogram examinations, most                 recent 05/13/2022. Aortic Valve Disease and  Mitral Valve Disease;                 Risk Factors:Hypertension and Dyslipidemia.                 Aortic Valve: 23 mm Sapien prosthetic, stented (TAVR) valve is                 present in the aortic position. Procedure Date: 05/20/2022.  Sonographer:    Darlina Sicilian RDCS Referring Phys: [id number] [place]  1. Note patient had CHB post deployment and required temporary pacing.  2. Left ventricular ejection fraction, by estimation, is 60 to 65%. The left ventricle has normal function. There is mild left ventricular  hypertrophy.  3. Right ventricular systolic function is normal. The right ventricular size is normal.  4. Left atrial size was moderately dilated.  5. Severe bulky/caseating MAC particularly posterior annulus Moderate funcitonal MS. The mitral valve is degenerative. Trivial mitral valve regurgitation. Moderate mitral stenosis. Severe mitral annular calcification.  6. Pre TAVR: calcified tri leaflet valve with restricted motion mean gradient 41 peak 65 mmHg AVA 0.89 cm2         Post TAVR : slightly deep position of 23 mm Sapien 3 valve. No PVL mean gradient 7 peak 17 mmhg with AVA 2.3 cm2. There is a 23 mm Sapien prosthetic (TAVR) valve present in the aortic position. Procedure Date: 05/20/2022. FINDINGS  Left Ventricle: Left ventricular ejection fraction, by estimation, is 60 to 65%. The left ventricle has normal function. The left ventricular internal cavity size was normal in size. There is mild left ventricular hypertrophy. Right Ventricle: The right ventricular size is normal. Right vetricular wall thickness was not assessed. Right ventricular systolic function is normal. Left Atrium: Left atrial size was moderately dilated. Pericardium: There is no evidence of pericardial effusion. Mitral Valve: Severe bulky/caseating MAC particularly posterior annulus Moderate funcitonal MS. The mitral valve is degenerative in appearance. There is severe thickening of the mitral valve leaflet(s).  There is severe calcification of the mitral valve leaflet(s). Severe mitral annular calcification. Trivial mitral valve regurgitation. Moderate mitral valve stenosis. MV peak gradient, 11.3 mmHg. The mean mitral valve gradient is 4.0 mmHg. Tricuspid Valve: The tricuspid valve is not assessed. Aortic Valve: Pre TAVR: calcified tri leaflet valve with restricted motion mean gradient 41 peak 65 mmHg AVA 0.89 cm2 Post TAVR : slightly deep position of 23 mm Sapien 3 valve. No PVL mean gradient 7 peak 17 mmhg with AVA 2.3 cm2. Aortic valve mean gradient measures 41.0 mmHg. Aortic valve peak gradient measures 65.3 mmHg. Aortic valve area, by VTI measures 0.89 cm. There is a 23 mm Sapien prosthetic, stented (TAVR) valve present in the aortic position. Procedure Date: 05/20/2022. Pulmonic Valve: The pulmonic valve was not assessed. Aorta: The aortic root was not well visualized. Additional Comments: Note patient had CHB post deployment and required temporary pacing.  LEFT VENTRICLE PLAX 2D LVOT diam:     1.90 cm LV SV:         82 LV SV Index:   59 LVOT Area:     2.84 cm  AORTIC VALVE AV Area (Vmax):    0.87 cm AV Area (Vmean):   0.87 cm AV Area (VTI):     0.89 cm AV Vmax:           404.00 cm/s AV Vmean:          304.000 cm/s AV VTI:            0.927 m AV Peak Grad:      65.3 mmHg AV Mean Grad:      41.0 mmHg LVOT Vmax:         124.00 cm/s LVOT Vmean:        93.800 cm/s LVOT VTI:          0.290 m LVOT/AV VTI ratio: 0.31 MITRAL VALVE MV Area VTI:  1.54 cm   SHUNTS MV Peak grad: 11.3 mmHg  Systemic VTI:  0.29 m MV Mean grad: 4.0 mmHg   Systemic Diam: 1.90 cm MV Vmax:      1.68 m/s MV Vmean:     97.8 cm/s Jenkins Rouge MD Electronically signed by Jenkins Rouge MD  Signature Date/Time: 05/20/2022/1:20:12 PM    Final    DG Chest Port 1V same Day  Result Date: 05/17/2022 CLINICAL DATA:  Congestive heart failure. EXAM: PORTABLE CHEST 1 VIEW COMPARISON:  05/15/2022 FINDINGS: Stable cardiomediastinal contours. Aortic  atherosclerotic calcifications. Mild interstitial edema pattern is improved compared with the previous exam. Decrease in pleural effusions. No airspace consolidation. IMPRESSION: Interval improvement in CHF pattern. Electronically Signed   By: Kerby Moors M.D.   On: 05/17/2022 07:32   DG Chest Port 1V same Day  Result Date: 05/15/2022 CLINICAL DATA:  Congestive heart failure EXAM: PORTABLE CHEST 1 VIEW COMPARISON:  Chest x-ray dated May 13, 2022 FINDINGS: Cardiomegaly. Diffuse interstitial opacities, slightly worsened when compared with prior. New trace left pleural effusion. No evidence of pneumothorax. IMPRESSION: 1. Worsening pulmonary edema. 2. New trace left pleural effusion. Electronically Signed   By: Yetta Glassman M.D.   On: 05/15/2022 08:30   DG Chest 2 View  Result Date: 05/13/2022 CLINICAL DATA:  Preop for heart surgery tomorrow. EXAM: CHEST - 2 VIEW COMPARISON:  CT scan 04/07/2022 FINDINGS: The cardiac silhouette, mediastinal and hilar contours are within normal limits. There is mild tortuosity and calcification of the thoracic aorta. Mild emphysematous changes with basilar pleural thickening versus small pleural effusions. No infiltrates or edema. The bony thorax is intact. Stable vertebral augmentation changes in the thoracic spine. IMPRESSION: Mild emphysematous changes and basilar pleural thickening versus small pleural effusions. Electronically Signed   By: Marijo Sanes M.D.   On: 05/13/2022 11:20   DG CHEST PORT 1 VIEW  Result Date: 05/13/2022 CLINICAL DATA:  Hypoxia during TAVR. EXAM: PORTABLE CHEST 1 VIEW COMPARISON:  05/12/2022 FINDINGS: The cardiac silhouette, mediastinal and hilar contours within normal limits given the AP projection and portable technique. Mild old central vascular congestion and slight increased interstitial markings with Kerley B-lines noted suggesting mild interstitial edema. No pleural effusions. No pneumothorax. IMPRESSION: Mild interstitial edema.  Electronically Signed   By: Marijo Sanes M.D.   On: 05/13/2022 11:17   ECHOCARDIOGRAM LIMITED  Result Date: 05/13/2022    ECHOCARDIOGRAM LIMITED REPORT   Patient Name:   Haley Trevino Date of Exam: 05/13/2022 Medical Rec #:  [id number]          Height:       62.0 in Accession #:    [id number]         Weight:       99.0 lb Date of Birth:  1940/07/22          BSA:          1.418 m Patient Age:    36 years           BP:           209/70 mmHg Patient Gender: F                  HR:           88 bpm. Exam Location:  Inpatient Procedure: Limited Echo, Color Doppler and Cardiac Doppler Indications:     Aortic Stenosis i35.0  History:         Patient has prior history of Echocardiogram examinations, most                  recent 03/20/2022. Risk Factors:Hypertension and Dyslipidemia.  Sonographer:     Raquel Sarna Senior RDCS Referring Phys:  [place]: Sanda Klein MD IMPRESSIONS  1. Left ventricular ejection fraction, by estimation, is  70 to 75%. The left ventricle has hyperdynamic function. The left ventricle has no regional wall motion abnormalities. There is mild concentric left ventricular hypertrophy.  2. Right ventricular systolic function is normal. The right ventricular size is normal. There is moderately elevated pulmonary artery systolic pressure. The estimated right ventricular systolic pressure is 18.8 mmHg.  3. Left atrial size was severely dilated.  4. The mitral valve is degenerative. Trivial mitral valve regurgitation. Mild to moderate mitral stenosis. The mean mitral valve gradient is 9.9 mmHg with average heart rate of 85 bpm. Severe mitral annular calcification.  5. There is severe calcifcation of the aortic valve. There is severe thickening of the aortic valve. Severe aortic valve stenosis.  6. The inferior vena cava is normal in size with greater than 50% respiratory variability, suggesting right atrial pressure of 3 mmHg. Comparison(s): Prior images reviewed side by  side. PA pressure is higher. Otherwise little change. FINDINGS  Left Ventricle: Left ventricular ejection fraction, by estimation, is 70 to 75%. The left ventricle has hyperdynamic function. The left ventricle has no regional wall motion abnormalities. There is mild concentric left ventricular hypertrophy. Right Ventricle: The right ventricular size is normal. Right vetricular wall thickness was not well visualized. Right ventricular systolic function is normal. There is moderately elevated pulmonary artery systolic pressure. The tricuspid regurgitant velocity is 3.54 m/s, and with an assumed right atrial pressure of 3 mmHg, the estimated right ventricular systolic pressure is 41.6 mmHg. Left Atrium: Left atrial size was severely dilated. Right Atrium: Right atrial size was normal in size. Mitral Valve: The mitral valve is degenerative in appearance. Severe mitral annular calcification. Trivial mitral valve regurgitation. Mild to moderate mitral valve stenosis. MV peak gradient, 20.8 mmHg. The mean mitral valve gradient is 9.9 mmHg with average heart rate of 85 bpm. Tricuspid Valve: The tricuspid valve is normal in structure. Tricuspid valve regurgitation is mild. Aortic Valve: There is severe calcifcation of the aortic valve. There is severe thickening of the aortic valve. Severe aortic stenosis is present. Aortic valve mean gradient measures 39.4 mmHg. Aortic valve peak gradient measures 72.6 mmHg. Aortic valve area, by VTI measures 0.93 cm. Pulmonic Valve: The pulmonic valve was grossly normal. Pulmonic valve regurgitation is not visualized. Venous: The inferior vena cava is normal in size with greater than 50% respiratory variability, suggesting right atrial pressure of 3 mmHg. IAS/Shunts: The interatrial septum was not well visualized. Additional Comments: Spectral Doppler performed. Color Doppler performed.  LEFT VENTRICLE PLAX 2D LVOT diam:     1.90 cm LV SV:         82 LV SV Index:   58 LVOT Area:     2.84  cm  AORTIC VALVE AV Area (Vmax):    0.92 cm AV Area (Vmean):   0.89 cm AV Area (VTI):     0.93 cm AV Vmax:           426.02 cm/s AV Vmean:          290.130 cm/s AV VTI:            0.879 m AV Peak Grad:      72.6 mmHg AV Mean Grad:      39.4 mmHg LVOT Vmax:         138.98 cm/s LVOT Vmean:        91.347 cm/s LVOT VTI:          0.289 m LVOT/AV VTI ratio: 0.33 MITRAL VALVE  TRICUSPID VALVE MV Area (PHT): 2.29 cm   TR Peak grad:   50.1 mmHg MV Area VTI:   1.59 cm   TR Vmax:        354.00 cm/s MV Peak grad:  20.8 mmHg MV Mean grad:  9.9 mmHg   SHUNTS MV Vmax:       2.28 m/s   Systemic VTI:  0.29 m MV VTI:        0.52 m     Systemic Diam: 1.90 cm MV Decel Time: 331 msec Mihai Croitoru MD Electronically signed by Sanda Klein MD Signature Date/Time: 05/13/2022/10:30:51 AM    Final    Structural Heart Procedure  Result Date: 05/13/2022 See surgical note for result.  Disposition   Pt is being discharged home today in good condition.  Follow-up Plans & Appointments     Follow-up Information     Eileen Stanford, PA-C. Go on 06/06/2022.   Specialties: Cardiology, Radiology Why: @ 1pm, please arrive at least 10 min early. Contact information: [address] [place] [id number] [phone]         [facility] Follow up.   Why: Oxygen- to be delivered via [employer]. Contact information: [place] [address] [phone]                Discharge Instructions     Amb Referral to Cardiac Rehabilitation   Complete by: As directed    Diagnosis: Valve Replacement   Valve: Aortic Comment - TAVR   After initial evaluation and assessments completed: Virtual Based Care may be provided alone or in conjunction with Phase 2 Cardiac Rehab based on patient barriers.: Yes   Intensive Cardiac Rehabilitation (ICR) [place] location only OR Traditional Cardiac Rehabilitation (TCR) *If criteria for ICR are not met will enroll in TCR [facility] only):  Yes   Diet - low sodium heart healthy   Complete by: As directed    Discharge wound care:   Complete by: As directed    Already addressed   Increase activity slowly   Complete by: As directed        Discharge Medications   Allergies as of 05/29/2022       Reactions   Bactrim [sulfamethoxazole-trimethoprim] Nausea And Vomiting   Dilaudid [hydromorphone Hcl] Anaphylaxis   Dilaudid [hydromorphone] Anaphylaxis   Acyclovir And Related    Unknown reaction   Crestor [rosuvastatin Calcium] Other (See Comments)   Muscle pain   Gabapentin Swelling   Swelling in feet, and in legs   Aleve [naproxen Sodium] Rash   Lyrica [pregabalin] Other (See Comments)   Swelling in feet, and in legs        Medication List     TAKE these medications    acetaminophen 500 MG tablet Commonly known as: TYLENOL Take 1,000 mg by mouth at bedtime.   apixaban 2.5 MG Tabs tablet Commonly known as: ELIQUIS Take 1 tablet (2.5 mg total) by mouth 2 (two) times daily. Start taking on: June 02, 2022   aspirin 81 MG chewable tablet Chew 1 tablet (81 mg total) by mouth daily. Start taking on: May 30, 2022   CALCIUM PO Take 600 mg by mouth daily.   clonazePAM 0.5 MG tablet Commonly known as: KLONOPIN Take 1 tablet (0.5 mg total) by mouth 2 (two) times daily as needed for anxiety.   ezetimibe 10 MG tablet Commonly known as: Zetia Take 1 tablet (10 mg total) by mouth daily.   FLUoxetine  40 MG capsule Commonly known as: PROZAC Take 1 capsule (40 mg total) by mouth daily.   fluticasone 50 MCG/ACT nasal spray Commonly known as: FLONASE PLACE 2 SPRAYS INTO BOTH NOSTRILS DAILY What changed:  when to take this reasons to take this   furosemide 20 MG tablet Commonly known as: Lasix Take 1 tablet (20 mg total) by mouth daily.   NIFEdipine 60 MG 24 hr tablet Commonly known as: PROCARDIA XL/NIFEDICAL XL Take 1 tablet (60 mg total) by mouth daily.   omeprazole 20 MG capsule Commonly  known as: PRILOSEC TAKE 1 TABLET DAILY   PRESERVISION AREDS 2+MULTI VIT PO Take 1 capsule by mouth in the morning and at bedtime.   Prolia 60 MG/ML Sosy injection Generic drug: denosumab Inject 60 mg into the skin every 6 (six) months.   Restasis 0.05 % ophthalmic emulsion Generic drug: cycloSPORINE Place 1 drop into both eyes 2 (two) times daily.   sildenafil 20 MG tablet Commonly known as: REVATIO Take 1 tablet (20 mg total) by mouth 2 (two) times daily.   Synthroid 75 MCG tablet Generic drug: levothyroxine TAKE (1) TABLET DAILY BE- FORE BREAKFAST.   Vitamin D 50 MCG (2000 UT) tablet Take 2,000 Units by mouth daily.               Durable Medical Equipment  (From admission, onward)           Start     Ordered   05/28/22 1135  For home use only DME oxygen  Once       Question Answer Comment  Length of Need Lifetime   Mode or (Route) Nasal cannula   Liters per Minute 4   Frequency Continuous (stationary and portable oxygen unit needed)   Oxygen delivery system Gas      05/28/22 1134              Discharge Care Instructions  (From admission, onward)           Start     Ordered   05/29/22 0000  Discharge wound care:       Comments: Already addressed   05/29/22 0852             Outstanding Labs/Studies   CBC and BMET at follow-up; Consider arranging HRCT as discussed above.   Duration of Discharge Encounter   Greater than 30 minutes including physician time.  Signed, Erma Heritage, PA-C 05/29/2022, 9:04 AM

## 2022-05-28 NOTE — Progress Notes (Signed)
CCM informed via E-Link of patients 6.4 potassium

## 2022-05-28 NOTE — Progress Notes (Signed)
NAME:  Haley Trevino, MRN:  [id number], DOB:  1941-04-11, LOS: 35 ADMISSION DATE:  05/13/2022, CONSULTATION DATE:  05/20/2022 REFERRING MD: Alfredo Martinez -[facility], CHIEF COMPLAINT: Hypoxia post TAVR  History of Present Illness:  81 year old woman who underwent TAVR 11/13.  Significant perioperative hypoxia thought to be due to to pulmonary edema.  She had known aortic stenosis and was seen recently by Dr. Lavonna Monarch for worsening of her symptoms which had progressed to an NYHA class III.  She had a calculated aortic valve area of 1.1 cm and her cath did not show significant coronary artery disease.   Intra-op TAVR course c/b on-going nausea and hypoxia. PCCM asked to see post-operatively for hypoxia   Pertinent  Medical History  Scleroderma, Raynauds on Revatio,  HTN, HL, GERD, IBS, hypothyroidism, chronic diastolic HF, severe AS from MAC.   Significant Hospital Events: Including procedures, antibiotic start and stop dates in addition to other pertinent events   Admitted 11/7 for elective TAVR. Procedure delayed due to respiratory failure and pulm edema 11/14 went for TAVR.  Had sig nausea during procedure w/ increasing oxygen needs. Bradycardia/CBH s/p valve deployment requiring transvenous PM Returned to ICU post TAVR on NRB. PCCM asked to evaluate 11/16 unable to lie supine for PPM. Given diuretic.  11/17 remains ICU with TVP  11/18 O2 requirements are decreasing, heart rate stable.  Anticoagulation held planning for possible device placement Monday. 11/21 permanent pacemaker placement  Interim History / Subjective:  No acute events overnight  Treated for hyperkalemia   Subjective: endorses soreness to L shoulder s/p pacemaker placement. Denies sob  Objective   Blood pressure 134/67, pulse 71, temperature 98.7 F (37.1 C), temperature source Oral, resp. rate (!) 21, height _0  (1.575 m), weight 43 kg, SpO2 93 %.        Intake/Output Summary (Last 24 hours) at  05/28/2022 2130 Last data filed at 05/28/2022 0300 Gross per 24 hour  Intake 200 ml  Output 500 ml  Net -300 ml   Filed Weights   05/25/22 0500 05/26/22 0500 05/27/22 0500  Weight: 44 kg 43.6 kg 43 kg    Examination: General: In bed, NAD, appears comfortable HEENT: MM pink/moist, anicteric, atraumatic Neuro: RASS 0, PERRL 58m, GCS 15 CV: S1S2, NSR, no m/r/g appreciated PULM:  clear in the upper lobes, clear in the lower lobes, trachea midline, chest expansion symmetric GI: soft, bsx4 active, non-tender   Extremities: warm/dry, no pretibial edema, capillary refill less than 3 seconds  Skin: Pacemaker site c/d/i no rashes or lesions noted  Labs/imaging K 5.0>6.4 CXR: s/p pacemaker, no pneumo, small bibasilar pulmonary edema, small bilateral pulmonary effusion  Assessment & Plan:  Acute hypoxic respiratory failure, suspect secondary to pulmonary edema- improving S/P 5 days of Zosyn. On room air. -Goal SPO2 92-98% -Continue IS and flutter valve -Mobilize as able  Severe AS now S/p TAVR 11/14;  intra-op course c/b Symptomatic bradycardia and HTN Complete Heart Block s/p TVP S/p permanent pacemaker placement -Continuous tele -Continue ASA -Diuretics per cards. On PO lasix. -AC per cards post procedure  Hyperkalemia K 5.0>6.4. ?Hemolysis. Given lokelma, Calcium gluconate, 1082m bicarb, D50/insulin -Follow up BMP at 1200 -Temporize as needed  H/o Raynaud's  H/p scleroderma  Plan: -Continue sildenafil -Consider HRCT as outpatient  Anemia, chronic iron deficiency anemia HGB 10.1>9.8. On iron infusion outpatient. -Transfuse PRBC if HBG less than 7 -Monitor for signs of bleeding  H/o hypothyroidism  Plan -Continue synthroid  Diarrhea-improving No abdominal pain, afebrile -  Monitor   Best Practice (right click and "Reselect all SmartList Selections" daily)   Diet/type: Regular consistency (see orders) DVT prophylaxis: SCD GI prophylaxis: N/A Lines:  N/A Foley:  N/A Code Status:  full code Last date of multidisciplinary goals of care discussion [Family updated at bedside 11/22]  [employer]., MSN, APRN, AGACNP-BC [facility]  05/28/2022 , 9:22 AM  Please see [email or web address] for pager details  If no response, please call [phone] After hours, please call Elink at [phone]

## 2022-05-28 NOTE — Progress Notes (Signed)
Pt admitted to rm 23 from [facility]. Initiated tele. CHG wipe given. VSS. Call bell within reach.   Lavenia Atlas, RN

## 2022-05-28 NOTE — Progress Notes (Incomplete)
Rounding Note    Patient Name: Haley Trevino Date of Encounter: 05/29/2022  [place] Cardiologist: [name], MD   Subjective   Patient is awake.  Husband is present in the room.  She is wearing O2 by cannula.  She is looking forward to discharge today.  Husband already has the O2 with cannula at home.  She was able to ambulate yesterday.  It is of the husband or patient sees any concern about discharge.  Inpatient Medications    Scheduled Meds:  acetaminophen  1,000 mg Oral QHS   aspirin  81 mg Oral Daily   Chlorhexidine Gluconate Cloth  6 each Topical Daily   cycloSPORINE  1 drop Both Eyes BID   ezetimibe  10 mg Oral Daily   feeding supplement  237 mL Oral BID BM   FLUoxetine  40 mg Oral Daily   fluticasone  2 spray Each Nare Daily   furosemide  20 mg Oral Daily   levothyroxine  75 mcg Oral Q0600   NIFEdipine  60 mg Oral Daily   pantoprazole  40 mg Oral Daily   sildenafil  20 mg Oral BID   sodium chloride flush  3 mL Intravenous Q12H   sodium chloride flush  3 mL Intravenous Q12H   Continuous Infusions:  sodium chloride Stopped (05/25/22 1502)   sodium chloride Stopped (05/25/22 1509)   PRN Meds: sodium chloride, sodium chloride, acetaminophen **OR** acetaminophen, clonazePAM, morphine injection, ondansetron (ZOFRAN) IV, mouth rinse, oxyCODONE, sodium chloride, sodium chloride flush, traMADol   Vital Signs    Vitals:   05/28/22 1744 05/28/22 1949 05/28/22 2340 05/29/22 0456  BP: (!) 148/56 (!) 133/57 (!) 129/49 (!) 152/55  Pulse: 63 70 75 71  Resp: 19     Temp: 98.6 F (37 C) 98.1 F (36.7 C) 98.1 F (36.7 C) 98.5 F (36.9 C)  TempSrc: Oral Oral Oral Oral  SpO2: 92% 94% 98% 90%  Weight:    47.8 kg  Height:        Intake/Output Summary (Last 24 hours) at 05/29/2022 0747 Last data filed at 05/28/2022 1400 Gross per 24 hour  Intake --  Output 350 ml  Net -350 ml      05/29/2022    4:56 AM 05/27/2022    5:00 AM 05/26/2022     5:00 AM  Last 3 Weights  Weight (lbs) 105 lb 6.4 oz 94 lb 12.8 oz 96 lb 1.9 oz  Weight (kg) 47.809 kg 43 kg 43.6 kg      Telemetry    Normal sinus rhythm- Personally Reviewed  ECG    A new tracing is not performed- Personally Reviewed  Physical Exam  Elderly GEN: No acute distress.   Neck: No JVD Cardiac: RRR, soft right upper sternal and left mid systolic murmur of the TAVR valve.  No rubs, or gallops.  The pacemaker pocket is not fluctuant and there is no drainage. Respiratory: Clear to auscultation bilaterally. GI: Soft, nontender, non-distended  MS: No edema; No deformity. Neuro:  Nonfocal  Psych: Normal affect   Labs    High Sensitivity Troponin:  No results for input(s): "TROPONINIHS" in the last 720 hours.   Chemistry Recent Labs  Lab 05/28/22 0251 05/28/22 1531 05/29/22 0048  NA 137 138 140  K 6.4* 2.6* 3.6  CL 107 104 103  CO2 '24 27 26  '$ GLUCOSE 88 119* 104*  BUN 11 9 7*  CREATININE 0.75 0.91 0.92  CALCIUM 7.7* 8.6* 8.7*  MG  --  1.8 1.7  GFRNONAA >60 >60 >60  ANIONGAP '6 7 11    '$ Lipids No results for input(s): "CHOL", "TRIG", "HDL", "LABVLDL", "LDLCALC", "CHOLHDL" in the last 168 hours.  Hematology Recent Labs  Lab 05/25/22 0448 05/26/22 0413 05/27/22 0233  WBC 5.6 6.2 6.0  RBC 3.10* 3.30* 3.25*  HGB 9.1* 10.1* 9.8*  HCT 29.4* 30.7* 31.1*  MCV 94.8 93.0 95.7  MCH 29.4 30.6 30.2  MCHC 31.0 32.9 31.5  RDW 15.9* 16.0* 16.2*  PLT 194 196 241   Thyroid No results for input(s): "TSH", "FREET4" in the last 168 hours.  BNP Recent Labs  Lab 05/23/22 0920 05/24/22 0555 05/25/22 0448  BNP 1,540.5* 1,698.0* 1,646.1*    DDimer No results for input(s): "DDIMER" in the last 168 hours.   Radiology    DG Chest 2 View  Result Date: 05/28/2022 CLINICAL DATA:  Shortness of breath. EXAM: CHEST - 2 VIEW COMPARISON:  May 23, 2022. FINDINGS: Stable cardiomediastinal silhouette. Status post transcatheter aortic valve repair. Interval placement of  left-sided pacemaker with leads in grossly good position. Mild bibasilar pulmonary edema is noted with small bilateral pleural effusions. Status post kyphoplasty at multiple levels of the thoracic spine. IMPRESSION: Interval placement of left-sided pacemaker with leads in grossly good position. Mild bibasilar pulmonary edema is noted with small bilateral pleural effusions. Electronically Signed   By: Marijo Conception M.D.   On: 05/28/2022 08:35   EP PPM/ICD IMPLANT  Result Date: 05/27/2022  CONCLUSIONS:  1. Severe AS s/p TAVR c/b complete heart block  2. Dual chamber permanent pacemaker with left bundle area lead  3.  No early apparent complications.    Cardiac Studies   No new data  Patient Profile     81 y.o. female severe AS, respiratory failure due to a/c diastolic CHF, TAVR 76/72/0947, CHB complicating procedure, PPM 05/27/22, new atrial flutter, hyperkalemia, diarrhea, and hypoxia, persistent requiring O2 therapy.   Assessment & Plan    Status post TAVR for severe aortic stenosis: Complicated by complete heart block.  Now has pacemaker.  Valve seems to be functioning normally. Complete heart block: Normally functioning AV sequential pacemaker.  Patient and husband note that they have not been instructed on use of the Merlin for outpatient pacer follow-up.  They would like to have this done later today possible prior to discharge. Atrial flutter: In sinus rhythm today. Respiratory failure/hypoxia: Will go home on O2 and hopefully oxygenation will improve if she gains strength. Chronic diastolic heart failure: Seems volume neutral today on exam.  Labs are stable. Acute kidney injury: Resolved Hyperkalemia: Resolved   Plan discharge today with follow-up with Dr. Cyndia Bent and Ali Lowe in 7 to 10 days.  She should have a basic metabolic panel performed on the day that she follows up with the TAVR team.  Teaching related to [facility] device prior to discharge.  For questions or updates,  please contact [place] Please consult [email or web address] for contact info under        Signed, Sinclair Grooms, MD  05/29/2022, 7:47 AM

## 2022-05-28 NOTE — Progress Notes (Signed)
CARDIAC REHAB PHASE I   PRE:  Rate/Rhythm: 68 SR pacing on demand    BP: sitting 137/88    SaO2: 91 1L, 85 RA  MODE:  Ambulation: 370 ft   POST:  Rate/Rhythm: 78 SR pacing    BP: sitting 153/54     SaO2: 87-88 3L   Pt eager to ambulate. Moved to EOB following pacer restrictions. On EOB, attempted RA however quickly down to 85 therefore qualifying for O2 at home. Used RW, 2L O2, contact guard. Slow and steady pace. Needed to increase O2 to 3L after a few feet in hall. No c/o walking except trouble steering the RW. She has a "better" RW at home as well as a cane. She has stairs but feels comfortable on them. Husband is very attentive. To recliner.   Discussed restrictions, walking, and CRPII. Will refer to [facility]. She might be a good candidate for Pulm Rehab in the future as well.  [id number]  Yves Dill BS, ACSM-CEP 05/28/2022 11:22 AM

## 2022-05-28 NOTE — Progress Notes (Signed)
[facility] VALVE TEAM  Patient Name: CASADY VOSHELL Date of Encounter: 05/28/2022  Primary Cardiologist: [name], MD  / Dr. Ali Lowe & Dr. Cyndia Bent (TAVR)      Hospital Problem List     Principal Problem:   S/P TAVR (transcatheter aortic valve replacement) Active Problems:   [name]   Hypertension   Hyperlipidemia with target LDL less than 100   Aortic stenosis   Acute on chronic diastolic heart failure (HCC)   Acute respiratory distress   Anxiety   [name]   Hyperkalemia   Complete heart block ([place])     Subjective   States she feels good. No complaints. Excited she might be going home in the next few days.   Inpatient Medications    Scheduled Meds:  acetaminophen  1,000 mg Oral QHS   aspirin  81 mg Oral Daily   Chlorhexidine Gluconate Cloth  6 each Topical Daily   cycloSPORINE  1 drop Both Eyes BID   ezetimibe  10 mg Oral Daily   feeding supplement  237 mL Oral BID BM   FLUoxetine  40 mg Oral Daily   fluticasone  2 spray Each Nare Daily   furosemide  20 mg Oral Daily   levothyroxine  75 mcg Oral Q0600   NIFEdipine  60 mg Oral Daily   pantoprazole  40 mg Oral Daily   sildenafil  20 mg Oral BID   sodium bicarbonate       sodium chloride flush  3 mL Intravenous Q12H   sodium chloride flush  3 mL Intravenous Q12H   Continuous Infusions:  sodium chloride Stopped (05/25/22 1502)   sodium chloride Stopped (05/25/22 1509)   PRN Meds: sodium chloride, sodium chloride, acetaminophen **OR** acetaminophen, clonazePAM, morphine injection, ondansetron (ZOFRAN) IV, mouth rinse, oxyCODONE, sodium bicarbonate, sodium chloride, sodium chloride flush, traMADol   Vital Signs    Vitals:   05/28/22 0500 05/28/22 0600 05/28/22 0700 05/28/22 0800  BP: (!) 133/51 (!) 143/53  134/67  Pulse: 64 66 76 71  Resp: _0 (!) 21  Temp:      TempSrc:      SpO2: 92% 91% 92% 93%  Weight:       Height:        Intake/Output Summary (Last 24 hours) at 05/28/2022 0927 Last data filed at 05/28/2022 0300 Gross per 24 hour  Intake 200 ml  Output 500 ml  Net -300 ml   Filed Weights   05/25/22 0500 05/26/22 0500 05/27/22 0500  Weight: 44 kg 43.6 kg 43 kg    Physical Exam    GEN: Well nourished, well developed, in no acute distress.  HEENT: Grossly normal.  Neck: Supple, no JVD, carotid bruits, or masses. Cardiac: RRR, intermittent pacing. Soft flow murmer. No rubs or gallops. No clubbing, cyanosis, edema.   Respiratory:  Respirations regular and unlabored, mild crackles at the bases Skin: warm and dry, no rash. Extremities: no edema Psych: AAOx3.  Normal affect.  Labs    CBC Recent Labs    05/26/22 0413 05/27/22 0233  WBC 6.2 6.0  HGB 10.1* 9.8*  HCT 30.7* 31.1*  MCV 93.0 95.7  PLT 196 612   Basic Metabolic Panel Recent Labs    05/27/22 0233 05/28/22 0251  NA 139 137  K 5.0 6.4*  CL 107 107  CO2 23 24  GLUCOSE 90 88  BUN 10 11  CREATININE 0.84 0.75  CALCIUM 8.5* 7.7*    Telemetry    Intermittently paced - Personally Reviewed  ECG    No new - Personally Reviewed  Radiology    DG Chest 2 View  Result Date: 05/28/2022 CLINICAL DATA:  Shortness of breath. EXAM: CHEST - 2 VIEW COMPARISON:  May 23, 2022. FINDINGS: Stable cardiomediastinal silhouette. Status post transcatheter aortic valve repair. Interval placement of left-sided pacemaker with leads in grossly good position. Mild bibasilar pulmonary edema is noted with small bilateral pleural effusions. Status post kyphoplasty at multiple levels of the thoracic spine. IMPRESSION: Interval placement of left-sided pacemaker with leads in grossly good position. Mild bibasilar pulmonary edema is noted with small bilateral pleural effusions. Electronically Signed   By: Marijo Conception M.D.   On: 05/28/2022 08:35   EP PPM/ICD IMPLANT  Result Date: 05/27/2022  CONCLUSIONS:  1. Severe AS s/p TAVR c/b  complete heart block  2. Dual chamber permanent pacemaker with left bundle area lead  3.  No early apparent complications.    Cardiac Studies   [facility]  T[facility]R OPERATIVE NOTE     Date of Procedure:                05/20/2022   Preoperative Diagnosis:      Severe Aortic Stenosis    Postoperative Diagnosis:    Same    Procedure:        Transcatheter Aortic Valve Replacement - Transfemoral Approach             Edwards Sapien 3 Resilia THV (size 45m, model # 9755RLS, serial # [id number])              Co-Surgeons:                         BGaye Pollack MD and ALenna Sciara MD Anesthesiologist:                  WRoderic Palau MD   Echocardiographer:              PJenkins Rouge MD   Pre-operative Echo Findings: Severe aortic stenosis Normal left ventricular systolic function   Post-operative Echo Findings: No paravalvular leak Normal left ventricular systolic function   Left Heart Catheterization Findings: Left ventricular end-diastolic pressure of [id number]  ______________________   Echo - 05/21/2022 1. Left ventricular ejection fraction, by estimation, is 60 to 65%. The  left ventricle has normal function. The left ventricle has no regional  wall motion abnormalities. There is moderate left ventricular hypertrophy.  Left ventricular diastolic  parameters are indeterminate.   2. Right ventricular systolic function is normal. The right ventricular  size is normal. There is normal pulmonary artery systolic pressure.   3. Left atrial size was mildly dilated.   4. Moderate functional MS due to MAC. The mitral valve is degenerative.  Trivial mitral valve regurgitation. Moderate mitral stenosis. Severe  mitral annular calcification.   5. Tricuspid valve regurgitation is moderate.   6. Post TAVR with 23 mm Sapien 3 valve implant 05/20/22 no PVL mean  gradient 11.5 mm peak 24.8 mm DVI 0.74 AVA 2.5 cm2 Trivial central AR .  The aortic valve has been  repaired/replaced. Aortic valve regurgitation is  trivial. No aortic stenosis is  present.   7. The inferior vena cava is normal in size with greater than 50%  respiratory variability, suggesting right atrial pressure of 3 mmHg.   Pacemaker Implanted 05/27/22  CONCLUSIONS:  1. Severe AS s/p TAVR c/b complete heart block 2. Dual chamber permanent pacemaker with left bundle area lead 3. No early apparent complications.   Patient Profile     ASAIAH HUNNICUTT is a 81 y.o. female with a history of scleroderma and Raynaud's on chronic Revatio therapy, HLD, iron deficiency anemia managed with intermittent iron infusion, chronic diastolic CHF, moderate mitral stenosis due to MAC, and severe AS who presented to [facility] on 05/13/22 for planned TAVR. However, she was found to be hypoxic on anesthesia induction and case was aborted.  She was admitted for acute respiratory failure and acute anemia.      Assessment & Plan    Severe AS: s/p successful TAVR with a 23 mm Edwards Sapien 3 Ultra Resilia THV via the TF approach on 05/20/22. Post operative echo 05/21/22 showed EF 60%, normally functioning TAVR with a mean gradient 11.5 and trivial AR. Groin sites are stable.    CHB: pt had complete heart block after valve deployment. Has a IJ temp wire in place. Currently V paced with underlying atrial flutter. PPM placement canceled 05/22/22 due to hypoxia on the table. Device placed yesterday 05/27/22   Atrial Flutter: new onset this admission. Rate controlled.  Plan to start a DOAC when cleared by EP.    Acute respiratory failure with hypoxia: completed 5-day course of Zosyn for possible aspiration PNA (n/v during case). Aggressively diuresed. 02 sat improved to 90s on RA at rest. Continue early mobility flutter and I-S. Up in chair as much as tolerated. Pulm following. Plan HRCT to rule out CREST syndrome as an outpatient. Ambulate today.    Acute on chronic diastolic CHF: she has been on IV Lasix. Net neg  4.9L. Weight down 4 lbs (100--> 94.8 lbs.) on Lasix 87m PO.Creat stable.    AKI: creat peaked at 1.45. Down to 0.75 today.    Leukocytosis: WBC up to 18.8 05/21/22. Possibly related to aspiration. Treated with IV abx. Now normal 6.0.      Chronic iron deficiency anemia: follows closely with GI and hematology. She receives Q3 week iron infusions. Transfused 1 unit PRBC on admission. Hg stable at 9.8 11/22. Continue to monitor   Anxiety: has worsening anxiety during admission and continued on home meds.    Moderate mitral stenosis with MAC: continue to follow with surveillance imaging   H/o Raynaud's & scleroderma: Sildenafil continued. May need HRCT outpatient.  Hyperkalemia: K+ continues to trend up, 6.4 this am. some question as to the accuracy (hemolysis). Given lokelma, D50, and insulin this am by critical care. Plan to recheck at noon.   Hypocalcemia: Ca++ 7.7 this am down from 8.5 yesterday. Baseline 8.5. Given ca++ gluconate this am by critical care. Plan to recheck at noon.   Signed, MHessie Dibble RN, Nurse Practitioner Student  05/28/2022, 9:27 AM

## 2022-05-28 NOTE — Care Management (Signed)
05-28-22 1148 Case Manager received notification from cardiac rehab that the patient needs oxygen. Case Manager spoke with spouse and they are agreeable to use [employer]. Referral submitted to [facility] with [employer] and DME will be delivered to the room prior to transition home. No further needs identified.

## 2022-05-28 NOTE — Progress Notes (Signed)
[facility] Progress Note Patient Name: Haley Trevino DOB: [birth year] MRN: [id number]   Date of Service  05/28/2022  HPI/Events of Note  Hyperkalemia  Hypocalcemia - K+ - 6.4 and Ca++ = 7.7.   eICU Interventions  Plan: Lokelma 10 gm PO now. Ca++ gluconate 1 gm IV now. NaHCO3 100 meq IV now. D50 1 amp IV now. Novolog insulin 10 units IV now. Lokelma 10 gm PO now. Repeat BMP at 12 noon.      Intervention Category Major Interventions: Electrolyte abnormality - evaluation and management  Lysle Dingwall 05/28/2022, 4:37 AM

## 2022-05-29 LAB — BASIC METABOLIC PANEL
Anion gap: 11 (ref 5–15)
BUN: 7 mg/dL — ABNORMAL LOW (ref 8–23)
CO2: 26 mmol/L (ref 22–32)
Calcium: 8.7 mg/dL — ABNORMAL LOW (ref 8.9–10.3)
Chloride: 103 mmol/L (ref 98–111)
Creatinine, Ser: 0.92 mg/dL (ref 0.44–1.00)
GFR, Estimated: >60 mL/min (ref >60)
Glucose, Bld: 104 mg/dL — ABNORMAL HIGH (ref 70–99)
Potassium: 3.6 mmol/L (ref 3.5–5.1)
Sodium: 140 mmol/L (ref 135–145)

## 2022-05-29 LAB — MAGNESIUM: Magnesium: 1.7 mg/dL (ref 1.7–2.4)

## 2022-05-29 MED ORDER — MAGNESIUM OXIDE -MG SUPPLEMENT 400 (240 MG) MG PO TABS
400.0000 mg | ORAL_TABLET | Freq: Once | ORAL | Status: AC
  Administered 2022-05-29: 400 mg via ORAL
  Filled 2022-05-29: qty 1

## 2022-05-29 MED ORDER — APIXABAN 2.5 MG PO TABS: 2.5000 mg | ORAL_TABLET | Freq: Two times a day (BID) | ORAL | Status: DC

## 2022-05-29 MED ORDER — ASPIRIN 81 MG PO CHEW: 81.0000 mg | CHEWABLE_TABLET | Freq: Every day | ORAL | Status: DC

## 2022-05-29 MED ORDER — APIXABAN 2.5 MG PO TABS: 2.5000 mg | ORAL_TABLET | Freq: Two times a day (BID) | ORAL | 5 refills | Status: DC

## 2022-05-29 MED ORDER — POTASSIUM CHLORIDE CRYS ER 20 MEQ PO TBCR
40.0000 meq | EXTENDED_RELEASE_TABLET | Freq: Once | ORAL | Status: AC
  Administered 2022-05-29: 40 meq via ORAL
  Filled 2022-05-29: qty 2

## 2022-05-29 NOTE — TOC Transition Note (Signed)
Transition of Care [facility]) - CM/SW Discharge Note   Patient Details  Name: ALINNA SIPLE MRN: [id number] Date of Birth: 04-08-1941  [facility] CM/SW Contact:  Ella Bodo, RN Phone Number: 05/29/2022, 10:20 AM   Clinical Narrative:    Patient medically stable for discharge home today per MD.  [employer] had delivered portable oxygen tanks to patient's room, but husband took them home, unaware that she would need them on the ride home.  Checked with Erasmo Downer with [employer]; she states they are able to dispatch a driver with a tank, but it may take several hours to arrive, as no [facility] staff on site.  Husband desires to go home and get tanks, as he states he can get back with them sooner.   Await husband's return with oxygen tanks.  30 day free trial card for Eliquis given to patient.    Final next level of care: Home/Self Care Barriers to Discharge: Barriers Resolved                       Discharge Plan and Services   Discharge Planning Services: CM Consult, Medication Assistance            DME Arranged: Oxygen DME Agency: [employer]                  Social Determinants of Health (SDOH) Interventions     Readmission Risk Interventions     No data to display         Reinaldo Raddle, RN, BSN  Trauma/Neuro ICU Case Manager [phone]

## 2022-05-29 NOTE — Progress Notes (Signed)
D/c tele and Ivs. Went over AVS with pt and her husband. All questions were addressed.   Lavenia Atlas, RN

## 2022-05-29 NOTE — Progress Notes (Signed)
Unable to wean pt off from 1L oxygen. Pt's 02 sat =89-90% at 1L 02 at rest.   Lavenia Atlas, RN

## 2022-06-02 ENCOUNTER — Ambulatory Visit (INDEPENDENT_AMBULATORY_CARE_PROVIDER_SITE_OTHER): Payer: Medicare Other

## 2022-06-02 VITALS — Ht 62.0 in | Wt 100.0 lb

## 2022-06-02 DIAGNOSIS — Z0000 Encounter for general adult medical examination without abnormal findings: Secondary | ICD-10-CM

## 2022-06-02 NOTE — Progress Notes (Signed)
Subjective:   Haley Trevino is a 81 y.o. female who presents for Medicare Annual (Subsequent) preventive examination. I connected with  [name] on 06/02/22 by a audio enabled telemedicine application and verified that I am speaking with the correct person using two identifiers.  Patient Location: Home  Provider Location: Home Office  I discussed the limitations of evaluation and management by telemedicine. The patient expressed understanding and agreed to proceed.  Review of Systems     Cardiac Risk Factors include: advanced age (>61mn, >>31women);hypertension;dyslipidemia     Objective:    Today's Vitals   06/02/22 1451  Weight: 100 lb (45.4 kg)  Height: '5\' 2"'$  (1.575 m)   Body mass index is 18.29 kg/m.     06/02/2022    2:57 PM 04/24/2022    9:22 AM 04/04/2022    9:06 AM 03/28/2022    8:34 AM 03/25/2022   11:15 AM 03/18/2022    8:15 AM 02/12/2022   12:50 PM  Advanced Directives  Does Patient Have a Medical Advance Directive? Yes No No No No No No  [place]        Copy of [place] Chart? No - copy requested        Would patient like information on creating a medical advance directive?  No - Patient declined No - Patient declined No - Patient declined No - Patient declined No - Patient declined No - Patient declined    Current Medications (verified) Outpatient Encounter Medications as of 06/02/2022  Medication Sig   acetaminophen (TYLENOL) 500 MG tablet Take 1,000 mg by mouth at bedtime.   apixaban (ELIQUIS) 2.5 MG TABS tablet Take 1 tablet (2.5 mg total) by mouth 2 (two) times daily.   aspirin 81 MG chewable tablet Chew 1 tablet (81 mg total) by mouth daily.   CALCIUM PO Take 600 mg by mouth daily.   Cholecalciferol (VITAMIN D) 50 MCG (2000 UT) tablet Take 2,000 Units by mouth daily.   clonazePAM (KLONOPIN) 0.5 MG tablet Take 1 tablet (0.5 mg total) by mouth 2 (two) times  daily as needed for anxiety.   ezetimibe (ZETIA) 10 MG tablet Take 1 tablet (10 mg total) by mouth daily.   FLUoxetine (PROZAC) 40 MG capsule Take 1 capsule (40 mg total) by mouth daily.   fluticasone (FLONASE) 50 MCG/ACT nasal spray PLACE 2 SPRAYS INTO BOTH NOSTRILS DAILY (Patient taking differently: Place 2 sprays into both nostrils daily as needed for allergies.)   furosemide (LASIX) 20 MG tablet Take 1 tablet (20 mg total) by mouth daily.   Multiple Vitamins-Minerals (PRESERVISION AREDS 2+MULTI VIT PO) Take 1 capsule by mouth in the morning and at bedtime.   NIFEdipine (PROCARDIA XL/NIFEDICAL XL) 60 MG 24 hr tablet Take 1 tablet (60 mg total) by mouth daily.   omeprazole (PRILOSEC) 20 MG capsule TAKE 1 TABLET DAILY   PROLIA 60 MG/ML SOSY injection Inject 60 mg into the skin every 6 (six) months.   RESTASIS 0.05 % ophthalmic emulsion Place 1 drop into both eyes 2 (two) times daily.   sildenafil (REVATIO) 20 MG tablet Take 1 tablet (20 mg total) by mouth 2 (two) times daily.   SYNTHROID 75 MCG tablet TAKE (1) TABLET DAILY BE- FORE BREAKFAST.   No facility-administered encounter medications on file as of 06/02/2022.    Allergies (verified) Bactrim [sulfamethoxazole-trimethoprim], Dilaudid [hydromorphone hcl], Dilaudid [hydromorphone], Acyclovir and related, Crestor [rosuvastatin calcium], Gabapentin, Aleve [  naproxen sodium], and Lyrica [pregabalin]   History: Past Medical History:  Diagnosis Date   Cataract    Depression    GERD (gastroesophageal reflux disease)    Hyperlipidemia    Hypertension    Hypothyroidism    IBS (irritable bowel syndrome)    Osteoarthritis    Raynaud disease    S/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 Ali Lowe & Bartle   Scleroderma [facility])    Thyroid disease    hypothyroidism   Past Surgical History:  Procedure Laterality Date   BREAST ENHANCEMENT SURGERY  1975   BREAST  IMPLANT REMOVAL Bilateral 04/23/2016   Procedure: REMOVALBILATERAL BREAST IMPLANTS;  Surgeon: Wallace Going, DO;  Location: [place];  Service: Plastics;  Laterality: Bilateral;   CHOLECYSTECTOMY  1973   ESOPHAGOGASTRODUODENOSCOPY N/A 10/25/2020   Procedure: ESOPHAGOGASTRODUODENOSCOPY (EGD);  Surgeon: Clarene Essex, MD;  Location: [facility];  Service: Endoscopy;  Laterality: N/A;  enteroscopy   HAND SURGERY  08/2012; 11/2012   HOT HEMOSTASIS N/A 10/25/2020   Procedure: HOT HEMOSTASIS (ARGON PLASMA COAGULATION/BICAP);  Surgeon: Clarene Essex, MD;  Location: [facility];  Service: Endoscopy;  Laterality: N/A;   INTRAOPERATIVE TRANSTHORACIC ECHOCARDIOGRAM N/A 05/13/2022   Procedure: INTRAOPERATIVE TRANSTHORACIC ECHOCARDIOGRAM;  Surgeon: Burnell Blanks, MD;  Location: [place] CV [facility];  Service: Open Heart Surgery;  Laterality: N/A;   INTRAOPERATIVE TRANSTHORACIC ECHOCARDIOGRAM N/A 05/20/2022   Procedure: INTRAOPERATIVE TRANSTHORACIC ECHOCARDIOGRAM;  Surgeon: Early Osmond, MD;  Location: [place];  Service: Open Heart Surgery;  Laterality: N/A;   IR RADIOLOGIST EVAL & MGMT  07/27/2017   IR SACROPLASTY BILATERAL  07/31/2017   IR VERTEBROPLASTY CERV/THOR BX INC UNI/BIL INC/INJECT/IMAGING  03/13/2020   IR VERTEBROPLASTY EA ADDL (T&LS) BX INC UNI/BIL INC INJECT/IMAGING  03/14/2020   MELANOMA EXCISION     at 31 yrs of age   ORIF HIP FRACTURE Right 02/22/2014   Procedure: OPEN REDUCTION INTERNAL FIXATION RIGHT HIP;  Surgeon: Sanjuana Kava, MD;  [facility];  Service: Orthopedics;  Laterality: Right;   PACEMAKER IMPLANT N/A 05/27/2022   Procedure: PACEMAKER IMPLANT;  Surgeon: Vickie Epley, MD;  Location: [place] [facility];  Service: Cardiovascular;  Laterality: N/A;   RIGHT HEART CATH AND CORONARY ANGIOGRAPHY N/A 03/28/2022   Procedure: RIGHT HEART CATH AND CORONARY ANGIOGRAPHY;  Surgeon: Early Osmond, MD;  Location: [place] [facility];  Service: Cardiovascular;   Laterality: N/A;   TOTAL ABDOMINAL HYSTERECTOMY  1974   TRANSCATHETER AORTIC VALVE REPLACEMENT, TRANSFEMORAL Left 05/13/2022   Procedure: Transcatheter Aortic Valve Replacement, Transfemoral;  Surgeon: Burnell Blanks, MD;  Location: [place] [facility];  Service: Open Heart Surgery;  Laterality: Left;   TRANSCATHETER AORTIC VALVE REPLACEMENT, TRANSFEMORAL Left 05/20/2022   Procedure: Transcatheter Aortic Valve Replacement, Transfemoral using an Edwards SAPIEN 3 Ultra 23 MM Heart Valve;  Surgeon: Early Osmond, MD;  Location: [place];  Service: Open Heart Surgery;  Laterality: Left;  Left Transfemoral   Family History  Problem Relation Age of Onset   Pancreatic cancer Father    Raynaud syndrome Father    Cancer Father        pancreatic   Rashes / Skin problems Daughter        possibly scleraderma   Hip fracture Mother    Mental illness Mother        attempted suicide at 80 yo   Social History  Socioeconomic History   Marital status: Married    Spouse name: Not on file   Number of children: 2   Years of education: Not on file   Highest education level: Some college, no degree  Occupational History   Occupation: retired Occupational psychologist at [employer]  Tobacco Use   Smoking status: Never   Smokeless tobacco: Never   Tobacco comments:    passive tobacco smoke exposure as child  Vaping Use   Vaping Use: Never used  Substance and Sexual Activity   Alcohol use: No   Drug use: No   Sexual activity: Not Currently    Birth control/protection: Surgical  Other Topics Concern   Not on file  Social History Narrative   Regular exercise: walkingCaffeine use: 1 cup of coffee daily   Lives in split level home with her husband   Social Determinants of Health   Financial Resource Strain: [place]  (06/02/2022)   Overall Financial Resource Strain (CARDIA)    Difficulty of Paying Living Expenses: Not hard at all  Food Insecurity: No Food Insecurity (06/02/2022)   Hunger Vital Sign     Worried About Running Out of Food in the Last Year: Never true    [place] in the Last Year: Never true  Transportation Needs: No Transportation Needs (06/02/2022)   PRAPARE - Hydrologist (Medical): No    Lack of Transportation (Non-Medical): No  Physical Activity: Inactive (06/02/2022)   Exercise Vital Sign    Days of Exercise per Week: 0 days    Minutes of Exercise per Session: 0 min  Stress: No Stress Concern Present (06/02/2022)   [place]    Feeling of Stress : Only a little  Social Connections: Moderately Integrated (06/02/2022)   Social Connection and Isolation Panel [NHANES]    Frequency of Communication with Friends and Family: More than three times a week    Frequency of Social Gatherings with Friends and Family: More than three times a week    Attends Religious Services: More than 4 times per year    Active Member of [employer] or Organizations: No    Attends Music therapist: Never    Marital Status: Married    Tobacco Counseling Counseling given: Not Answered Tobacco comments: passive tobacco smoke exposure as child   Clinical Intake:  Pre-visit preparation completed: Yes  Pain : No/denies pain     Nutritional Risks: None Diabetes: No  How often do you need to have someone help you when you read instructions, pamphlets, or other written materials from your doctor or pharmacy?: 1 - Never  Diabetic?no  Interpreter Needed?: No  Information entered by :: Jadene Pierini, LPN   Activities of Daily Living    06/02/2022    3:00 PM 03/28/2022    8:33 AM  In your present state of health, do you have any difficulty performing the following activities:  Hearing? 0 1  Vision? 0 0  Difficulty concentrating or making decisions? 0 0  Walking or climbing stairs? 0 0  Dressing or bathing? 0 0  Doing errands, shopping? 0   Preparing Food and eating ? N    Using the Toilet? N   In the past six months, have you accidently leaked urine? N   Do you have problems with loss of bowel control? N   Managing your Medications? N   Managing your Finances? N   Housekeeping or managing  your Housekeeping? N     Patient Care Team: [name], FNP as PCP - General (Nurse Practitioner) [name], MD as PCP - Cardiology (Cardiology) Clarene Essex, MD as Consulting Physician (Gastroenterology) Philemon Kingdom, MD as Consulting Physician (Internal Medicine) Hennie Duos, MD as Consulting Physician (Rheumatology) Peggyann Juba, MD as Referring Physician (Orthopedic Surgery) Artis Delay, MD as Referring Physician (Internal Medicine) Justice Britain, MD as Consulting Physician (Orthopedic Surgery) Sanjuana Kava, MD as Consulting Physician (Orthopedic Surgery) Barrett, Felisa Bonier as Physician Assistant (Cardiology) Katha Cabal, LCSW as [place] (Licensed Clinical Social Worker)  Indicate any recent [place] you may have received from other than [facility] providers in the past year (date may be approximate).     Assessment:   This is a routine wellness examination for Montina.  Hearing/Vision screen Vision Screening - Comments:: Wears rx glasses - up to date with routine eye exams with  Dr.Groat   Dietary issues and exercise activities discussed: Current Exercise Habits: The patient does not participate in regular exercise at present, Exercise limited by: cardiac condition(s);respiratory conditions(s)   Goals Addressed             This Visit's Progress    Exercise 3x per week (30 min per time)         Depression Screen    06/02/2022    2:56 PM 06/02/2022    2:55 PM 02/13/2022    2:12 PM 06/14/2021   12:59 PM 04/29/2021    2:06 PM 04/08/2021    3:49 PM 12/05/2020    1:27 PM  PHQ 2/9 Scores  PHQ - 2 Score 0 0 0 '2 2 2 2  '$ PHQ- 9 Score   0 '8 6 8 5    '$ Fall Risk    06/02/2022     2:53 PM 02/13/2022    2:12 PM 10/04/2021    8:57 AM 08/16/2021    2:34 PM 04/29/2021    2:08 PM  Fall Risk   Falls in the past year? 0 0 0 0 0  Number falls in past yr: 0    0  Injury with Fall? 0    0  Risk for fall due to : No Fall Risks    Orthopedic patient;Medication side effect  Follow up Falls prevention discussed    Falls prevention discussed    FALL RISK PREVENTION PERTAINING TO THE HOME:  Any stairs in or around the home? Yes  If so, are there any without handrails? No  Home free of loose throw rugs in walkways, pet beds, electrical cords, etc? Yes  Adequate lighting in your home to reduce risk of falls? Yes   ASSISTIVE DEVICES UTILIZED TO PREVENT FALLS:  Life alert? No  Use of a cane, walker or w/c? Yes  Grab bars in the bathroom? Yes  Shower chair or bench in shower? Yes  Elevated toilet seat or a handicapped toilet? Yes       04/20/2018    4:21 PM 03/26/2017    3:30 PM 12/08/2014    2:42 PM  MMSE - Mini Mental State Exam  Orientation to time '5 5 5  '$ Orientation to Place '5 5 5  '$ Registration '3 3 3  '$ Attention/ Calculation '5 5 5  '$ Recall 1 0 3  Language- name 2 objects '2 2 2  '$ Language- repeat '1 1 1  '$ Language- follow 3 step command '3 3 3  '$ Language- read & follow direction '1 1 1  '$ Write a  sentence '1 1 1  '$ Copy design 1 0 1  Total score '28 26 30        '$ 06/02/2022    3:00 PM 04/26/2020    2:33 PM 04/26/2019    2:56 PM  6CIT Screen  What Year? 0 points 0 points 0 points  What month? 0 points 0 points 0 points  What time? 0 points 0 points 0 points  Count back from 20 0 points 0 points 0 points  Months in reverse 0 points 0 points 0 points  Repeat phrase 0 points 0 points 0 points  Total Score 0 points 0 points 0 points    Immunizations Immunization History  Administered Date(s) Administered   Fluad Quad(high Dose 65+) 04/14/2019, 05/15/2020, 04/19/2021, 05/05/2022   Influenza, High Dose Seasonal PF 04/08/2018   Influenza,inj,Quad PF,6+ Mos 04/06/2013,  04/19/2014, 04/19/2015, 03/25/2016, 04/02/2017   Influenza-Unspecified 04/19/2014   Moderna Sars-Covid-2 Vaccination 08/18/2019, 09/16/2019, 05/23/2020   Pneumococcal Conjugate-13 06/02/2014   Pneumococcal Polysaccharide-23 06/25/2011, 04/06/2014   Tdap 07/07/2005, 04/17/2006, 03/26/2017   Zoster Recombinat (Shingrix) 04/20/2018, 06/23/2018   Zoster, Live 08/06/2009    TDAP status: Up to date  Flu Vaccine status: Up to date  Pneumococcal vaccine status: Up to date  Covid-19 vaccine status: Completed vaccines  Qualifies for Shingles Vaccine? Yes   Zostavax completed Yes   Shingrix Completed?: Yes  Screening Tests Health Maintenance  Topic Date Due   COLONOSCOPY (Pts [phone] Insurance coverage will need to be confirmed)  06/23/2018   COVID-19 Vaccine (4 - 2023-24 season) 03/07/2022   DEXA SCAN  08/16/2022   Medicare Annual Wellness (AWV)  06/03/2023   Pneumonia Vaccine 80 Years old  Completed   INFLUENZA VACCINE  Completed   Zoster Vaccines- Shingrix  Completed   HPV VACCINES  Aged Out    Health Maintenance  Health Maintenance Due  Topic Date Due   COLONOSCOPY (Pts [phone] coverage will need to be confirmed)  06/23/2018   COVID-19 Vaccine (4 - 2023-24 season) 03/07/2022    Colorectal cancer screening: No longer required.   Mammogram status: No longer required due to age.  Bone Density status: Completed 08/16/2020. Results reflect: Bone density results: OSTEOPENIA. Repeat every 5 years.  Lung Cancer Screening: (Low Dose CT Chest recommended if Age 324-80ears, 30 pack-year currently smoking OR have quit w/in 15years.) does not qualify.   Lung Cancer Screening Referral: n/a  Additional Screening:  Hepatitis C Screening: does not qualify;   Vision Screening: Recommended annual ophthalmology exams for early detection of glaucoma and other disorders of the eye. Is the patient up to date with their annual eye exam?  Yes  Who is the provider or what is  the name of the office in which the patient attends annual eye exams? Dr.groat If pt is not established with a provider, would they like to be referred to a provider to establish care? No .   Dental Screening: Recommended annual dental exams for proper oral hygiene  Community Resource Referral / Chronic Care Management: CRR required this visit?  No   CCM required this visit?  No      Plan:     I have personally reviewed and noted the following in the patient's chart:   Medical and social history Use of alcohol, tobacco or illicit drugs  Current medications and supplements including opioid prescriptions. Patient is not currently taking opioid prescriptions. Functional ability and status Nutritional status Physical activity Advanced directives List of other physicians Hospitalizations, surgeries, and  ER visits in previous 12 months Vitals Screenings to include cognitive, depression, and falls Referrals and appointments  In addition, I have reviewed and discussed with patient certain preventive protocols, quality metrics, and best practice recommendations. A written personalized care plan for preventive services as well as general preventive health recommendations were provided to patient.     Daphane Shepherd, LPN   11/94/1740   Nurse Notes: Post op pace maker will follow up with PCP

## 2022-06-02 NOTE — Patient Instructions (Signed)
[name] , Thank you for taking time to come for your Medicare Wellness Visit. I appreciate your ongoing commitment to your health goals. Please review the following plan we discussed and let me know if I can assist you in the future.   These are the goals we discussed:  Goals       Chronic Disease Management Needs      CARE PLAN ENTRY (see longtitudinal plan of care for additional care plan information)  Current Barriers:  Chronic Disease Management support, education, and care coordination needs related to OA, Depression,GERD, HTN, , HLD, [name],Hypothyroidism, Osteoporosis  Clinical Goals: Over the next 30 days, patient will have an Initial CCM Visit with RN Care Manager to discuss self-management of their chronic medical conditions  Interventions: Chart reviewed in preparation for initial visit telephone call Collaboration with other care team members as needed Unsuccessful outreach to patient  A HIPPA compliant phone message was left for the patient providing contact information and requesting a return call.  Request sent to care guides to reach out and reschedule patient's initial visit  Patient Self Care Activities: Undetermined   Initial goal documentation       Client will talk with LCSW in next 30 days to discuss stress issues of client and managing stress issues faced (pt-stated)      CARE PLAN ENTRY   Current Barriers:  Pain barriers in client with chronic diagnoses of OA, Hypothyroidism, Depression, GERD, HTN, HLD, Osteoporosis, [name] Anemia  Fatigue Potential for falls  Clinical Social Work Clinical Goal(s):  LCSW will call client in next 30 days to talk with client about managing stress issues of client  Interventions: Talked with client about CCM program support Encouraged client to talk with RNCM to discuss nursing needs of client Talked with client about stress issues of client Provided counseling support for client Talked  with client about pain issues of client Talked with client about health issues of her spouse Talked with client about sleeping challenges of client Talked with client about relaxation techniques (enjoys crafts, reading,cares for pets) Talked with client about her decreased appetite Talked with client about ambulation challenges of client ( client said she has Vertigo  Patient Self Care Activities:  Does daily ADLS independently Takes medications as prescribed Attends medical appointments  Patient Self Care Deficits Anemia Weakness, fatigue Fall potential   Initial goal documentation       Develop Pain Management Plan      Timeframe:  Short-Term Goal Priority:  Medium Start Date:                             Expected End Date:                       Follow-up: PCP on 09/14/20  Talk with PCP about plan for pain management or plan for determining source of pain Call RN Care Manager if needed [phone]      Exercise 3x per week (30 min per time)      Walk daily as tolerated      Exercise 3x per week (30 min per time)      Manage Anxiety and Stress issues faced; Manage depression issues faced (pt-stated)       Timeframe: Short-Term Goal  This Visit's Progress: On Track Priority : Medium    Start Date:   06/14/21  Expected End Date   09/06/21  Completed  Date  Follow Up Date:  08/09/21 at 11:15 AM  Manage Anxiety and Stress issues faced. Manage depression issues faced  Patient Self Care Activities:  Completes ADLs daily Takes Medications as prescribed  Patient Coping Strengths:  Family support Communicates needs to spouse or family members Self Advocates as needed  Patient Self Care Deficits:  Anxiety or Stress Challenges  Patient Goals:  - spend time or talk with others every day - practice relaxation or meditation daily - keep a calendar with appointment dates  Follow Up Plan: LCSW to call client or Arbie Cookey, daughter of client on 08/09/21 at 11:15 AM to  assess client needs         Manage My Emotions      Patient Stated (pt-stated)      Patient's goal is to complete medical evaluations by Dr. Watt Climes for abdominal pain and diarrhea, and have cataracts removed in the next few months.      Patient Stated      04/26/2020 AWV Goal: Exercise for General Health  Patient will verbalize understanding of the benefits of increased physical activity: Exercising regularly is important. It will improve your overall fitness, flexibility, and endurance. Regular exercise also will improve your overall health. It can help you control your weight, reduce stress, and improve your bone density. Over the next year, patient will increase physical activity as tolerated with a goal of at least 150 minutes of moderate physical activity per week.  You can tell that you are exercising at a moderate intensity if your heart starts beating faster and you start breathing faster but can still hold a conversation. Moderate-intensity exercise ideas include: Walking 1 mile (1.6 km) in about 15 minutes Biking Hiking Golfing Dancing Water aerobics Patient will verbalize understanding of everyday activities that increase physical activity by providing examples like the following: Yard work, such as: Sales promotion account executive Gardening Washing windows or floors Patient will be able to explain general safety guidelines for exercising:  Before you start a new exercise program, talk with your health care provider. Do not exercise so much that you hurt yourself, feel dizzy, or get very short of breath. Wear comfortable clothes and wear shoes with good support. Drink plenty of water while you exercise to prevent dehydration or heat stroke. Work out until your breathing and your heartbeat get faster.       Per LCSW: "I want to get my blood pressure under control" (pt-stated)      CARE PLAN ENTRY (see  longitudinal plan of care for additional care plan information)  Current Barriers:  Chronic Disease Management support and education needs related to hypertension Anxiety Recent med changes  Nurse Case Manager Clinical Goal(s):  Over the next 3 days, patient will work with PCP/clinical staff regarding elevated blood pressure Over the next 30 days, patient will talk with RN Care Manager regarding blood pressure management Over the next 90 days, patient will keep all scheduled medical appointments  Interventions:  Inter-disciplinary care team collaboration (see longitudinal plan of care) Chart reviewed including recent office and hospital notes as well as lab results Reviewed medications Recent ED visit. BP was elevated at that time.  Consulted by LCSW after he spoke with patient by telephone earlier today Per LCSW, patient has had elevated blood pressure recently with reading today of 177/68.  Patient has history of anxiety Recent medication changes Collaborated with [facility] Clinical staff and requested that they  schedule a f/u visit with PCP ASAP or discuss other management options with her if appointment is unavailable  CCM team to follow-up with patient again over the next 14 days  Patient Self Care Activities:  Performs ADL's independently Performs IADL's independently  Initial goal documentation       Resolve Anemia      Timeframe:  Long-Range Goal Priority:  Medium Start Date:                             Expected End Date:                       Follow-up: PCP on 09/14/20  Talk with PCP about anemia testing and management Take hemocyte as instructed Call PCP with any new or worsening symptoms Call RN Care Manager if needed [phone]        This is a list of the screening recommended for you and due dates:  Health Maintenance  Topic Date Due   Colon Cancer Screening  06/23/2018   COVID-19 Vaccine (4 - 2023-24 season) 03/07/2022   DEXA scan (bone density  measurement)  08/16/2022   Medicare Annual Wellness Visit  06/03/2023   Pneumonia Vaccine  Completed   Flu Shot  Completed   Zoster (Shingles) Vaccine  Completed   HPV Vaccine  Aged Out    Advanced directives: Please bring a copy of your health care power of attorney and living will to the office to be added to your chart at your convenience.   Conditions/risks identified: Aim for 30 minutes of exercise or brisk walking, 6-8 glasses of water, and 5 servings of fruits and vegetables each day.   Next appointment: Follow up in one year for your annual wellness visit    Preventive Care 65 Years and Older, Female Preventive care refers to lifestyle choices and visits with your health care provider that can promote health and wellness. What does preventive care include? A yearly physical exam. This is also called an annual well check. Dental exams once or twice a year. Routine eye exams. Ask your health care provider how often you should have your eyes checked. Personal lifestyle choices, including: Daily care of your teeth and gums. Regular physical activity. Eating a healthy diet. Avoiding tobacco and drug use. Limiting alcohol use. Practicing safe sex. Taking low-dose aspirin every day. Taking vitamin and mineral supplements as recommended by your health care provider. What happens during an annual well check? The services and screenings done by your health care provider during your annual well check will depend on your age, overall health, lifestyle risk factors, and family history of disease. Counseling  Your health care provider may ask you questions about your: Alcohol use. Tobacco use. Drug use. Emotional well-being. Home and relationship well-being. Sexual activity. Eating habits. History of falls. Memory and ability to understand (cognition). Work and work Statistician. Reproductive health. Screening  You may have the following tests or measurements: Height, weight,  and BMI. Blood pressure. Lipid and cholesterol levels. These may be checked every 5 years, or more frequently if you are over 30 years old. Skin check. Lung cancer screening. You may have this screening every year starting at age 57 if you have a 30-pack-year history of smoking and currently smoke or have quit within the past 15 years. Fecal occult blood test (FOBT) of the stool. You may have this test every year starting at age 15. Flexible  sigmoidoscopy or colonoscopy. You may have a sigmoidoscopy every 5 years or a colonoscopy every 10 years starting at age 33. Hepatitis C blood test. Hepatitis B blood test. Sexually transmitted disease (STD) testing. Diabetes screening. This is done by checking your blood sugar (glucose) after you have not eaten for a while (fasting). You may have this done every 1-3 years. Bone density scan. This is done to screen for osteoporosis. You may have this done starting at age 65. Mammogram. This may be done every 1-2 years. Talk to your health care provider about how often you should have regular mammograms. Talk with your health care provider about your test results, treatment options, and if necessary, the need for more tests. Vaccines  Your health care provider may recommend certain vaccines, such as: Influenza vaccine. This is recommended every year. Tetanus, diphtheria, and acellular pertussis (Tdap, Td) vaccine. You may need a Td booster every 10 years. Zoster vaccine. You may need this after age 66. Pneumococcal 13-valent conjugate (PCV13) vaccine. One dose is recommended after age 85. Pneumococcal polysaccharide (PPSV23) vaccine. One dose is recommended after age 73. Talk to your health care provider about which screenings and vaccines you need and how often you need them. This information is not intended to replace advice given to you by your health care provider. Make sure you discuss any questions you have with your health care provider. Document  Released: 07/20/2015 Document Revised: 03/12/2016 Document Reviewed: 04/24/2015 Elsevier Interactive Patient Education  2017 [employer] Prevention in the Home Falls can cause injuries. They can happen to people of all ages. There are many things you can do to make your home safe and to help prevent falls. What can I do on the outside of my home? Regularly fix the edges of walkways and driveways and fix any cracks. Remove anything that might make you trip as you walk through a door, such as a raised step or threshold. Trim any bushes or trees on the path to your home. Use bright outdoor lighting. Clear any walking paths of anything that might make someone trip, such as rocks or tools. Regularly check to see if handrails are loose or broken. Make sure that both sides of any steps have handrails. Any raised decks and porches should have guardrails on the edges. Have any leaves, snow, or ice cleared regularly. Use sand or salt on walking paths during winter. Clean up any spills in your garage right away. This includes oil or grease spills. What can I do in the bathroom? Use night lights. Install grab bars by the toilet and in the tub and shower. Do not use towel bars as grab bars. Use non-skid mats or decals in the tub or shower. If you need to sit down in the shower, use a plastic, non-slip stool. Keep the floor dry. Clean up any water that spills on the floor as soon as it happens. Remove soap buildup in the tub or shower regularly. Attach bath mats securely with double-sided non-slip rug tape. Do not have throw rugs and other things on the floor that can make you trip. What can I do in the bedroom? Use night lights. Make sure that you have a light by your bed that is easy to reach. Do not use any sheets or blankets that are too big for your bed. They should not hang down onto the floor. Have a firm chair that has side arms. You can use this for support while you get dressed. Do  not have throw rugs and other things on the floor that can make you trip. What can I do in the kitchen? Clean up any spills right away. Avoid walking on wet floors. Keep items that you use a lot in easy-to-reach places. If you need to reach something above you, use a strong step stool that has a grab bar. Keep electrical cords out of the way. Do not use floor polish or wax that makes floors slippery. If you must use wax, use non-skid floor wax. Do not have throw rugs and other things on the floor that can make you trip. What can I do with my stairs? Do not leave any items on the stairs. Make sure that there are handrails on both sides of the stairs and use them. Fix handrails that are broken or loose. Make sure that handrails are as long as the stairways. Check any carpeting to make sure that it is firmly attached to the stairs. Fix any carpet that is loose or worn. Avoid having throw rugs at the top or bottom of the stairs. If you do have throw rugs, attach them to the floor with carpet tape. Make sure that you have a light switch at the top of the stairs and the bottom of the stairs. If you do not have them, ask someone to add them for you. What else can I do to help prevent falls? Wear shoes that: Do not have high heels. Have rubber bottoms. Are comfortable and fit you well. Are closed at the toe. Do not wear sandals. If you use a stepladder: Make sure that it is fully opened. Do not climb a closed stepladder. Make sure that both sides of the stepladder are locked into place. Ask someone to hold it for you, if possible. Clearly mark and make sure that you can see: Any grab bars or handrails. First and last steps. Where the edge of each step is. Use tools that help you move around (mobility aids) if they are needed. These include: Canes. Walkers. Scooters. Crutches. Turn on the lights when you go into a dark area. Replace any light bulbs as soon as they burn out. Set up your  furniture so you have a clear path. Avoid moving your furniture around. If any of your floors are uneven, fix them. If there are any pets around you, be aware of where they are. Review your medicines with your doctor. Some medicines can make you feel dizzy. This can increase your chance of falling. Ask your doctor what other things that you can do to help prevent falls. This information is not intended to replace advice given to you by your health care provider. Make sure you discuss any questions you have with your health care provider. Document Released: 04/19/2009 Document Revised: 11/29/2015 Document Reviewed: 07/28/2014 Elsevier Interactive Patient Education  2017 [employer].

## 2022-06-03 ENCOUNTER — Telehealth: Payer: Self-pay | Admitting: *Deleted

## 2022-06-03 NOTE — Patient Outreach (Signed)
  Care Coordination [facility] Note Transition Care Management Follow-up Telephone Call Date of discharge and from where: [facility] on 05/29/2022 How have you been since you were released from the hospital? "Doing much better but still weak. I'm resting a lot better since I'm home." Any questions or concerns? No  Items Reviewed: Did the pt receive and understand the discharge instructions provided? Yes  Medications obtained and verified? Yes Eliquis and baby ASA Other? Yes use of supplemental oxygen at 2L via [place] and monitoring SPO2 level with pulse ox. Levels are staying > 95%. Encouraged continued daily weights each morning and to call cardio with any weight gain >2lbs overnight or >5lbs in a week Any new allergies since your discharge? No  Dietary orders reviewed? Yes Do you have support at home? Yes Husband is at home to assist her  [place] and Equipment/Supplies: Were home health services ordered? no If so, what is the name of the agency?   Has the agency set up a time to come to the patient's home? not applicable Were any new equipment or medical supplies ordered?  Yes: Oxygen via floor concentrator What is the name of the medical supply agency? [employer] Were you able to get the supplies/equipment? yes Do you have any questions related to the use of the equipment or supplies? No  Functional Questionnaire: (I = Independent and D = Dependent) ADLs: D  Bathing/Dressing- D  Meal Prep- D  Eating- I  Maintaining continence- I  Transferring/Ambulation- I  Managing Meds- I  Follow up appointments reviewed:  PCP Hospital f/u appt confirmed?  Need not indicated   Specialist Hospital f/u appt confirmed? Yes  Scheduled to see [facility] on 06/06/22 at 1:00 Are transportation arrangements needed? No  If their condition worsens, is the pt aware to call PCP or go to the Emergency Dept.? Yes Was the patient provided with contact information for the PCP's office or  ED? Yes Was to pt encouraged to call back with questions or concerns? Yes  SDOH assessments and interventions completed:   Yes  Care Coordination Interventions Activated:  No   Care Coordination Interventions:  No Care Coordination interventions needed at this time.   Encounter Outcome:  Pt. Visit Completed    Chong Sicilian, BSN, RN-BC RN Care Coordinator [place] Direct Dial: [phone] Main #: [phone]

## 2022-06-05 NOTE — Progress Notes (Signed)
HEART AND Haley Trevino                                     Cardiology Office Note:    Date:  06/06/2022   ID:  Haley Trevino, DOB 1941/04/25, MRN [id number]  PCP:  [name], [facility] Cardiologist:  [name], MD / Dr. Ali Lowe & Dr. Cyndia Bent (TAVR)      [facility] Electrophysiologist:  None   Referring MD: Hassell Done, Mary-Margaret, *   TOC s/p TAVR  History of Present Illness:    Haley Trevino is a 81 y.o. female with a hx of scleroderma and Raynaud's (on chronic Revatio therapy), HLD, iron deficiency anemia (managed with intermittent iron infusions), HFpEF and moderate mitral stenosis due to MAC and severe AS s/p TAVR (05/13/22) who presents to clinic for follow up.   She had been followed closely as an outpatient by the Structural Heart Team as echocardiogram in 03/2022 showed EF >75%, mod concentric LVH, and severe AS with a mean grad 48 mmHg, peak grad 76.9 mmHg, AVA 1.01 cm2, DVI 0.36, SVI 78, as well as severe MAC with moderate MS. Underwent L/RHC in 03/2022 which showed minimal obstructive coronary artery disease.    Upon arrival for planned TAVR on 05/13/22 she was found to be hypoxic and anemic on anesthesia induction and case was aborted. Case was aborted and she was admitted. She improved with blood transfusion and IV diuresis. She underwent successful TAVR on 05/20/2022 with a 23 mm Edwards Sapien 3 Ultra Resilia THV via the TF approach. Post operative echo on 05/21/22 showed EF 60% and normally functioning TAVR with a mean gradient 11.5 and trivial AR. She then developed recurrent respiratory failure requiring IV diuresis as well as CHB and underwent placement of an Assurity MRI PPM on 05/27/2022 by Dr. Quentin Ore. Additionally, she developed new onset aflutter and was started on Eliquis. She was seen by PCCM during admission who suspected chronic lung disease 2/2 CREST and recommended outpatient follow up  with HRCT. She was discharged on home oxygen and lasix 55m daily.   Today the patient presents to clinic for follow up. Here with her husband. She is doing well. Still a little weak. Wearing 2L of home 02. No Le edema, orthopnea, or PND. Weight has been stable.    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 TAli Lowe& Bartle   Scleroderma ([facility]    Thyroid disease    hypothyroidism    Past Surgical History:  Procedure Laterality Date   BREAST ENHANCEMENT SURGERY  1975   BREAST IMPLANT REMOVAL Bilateral 04/23/2016   Procedure: REMOVALBILATERAL BREAST IMPLANTS;  Surgeon: CWallace Going DO;  [facility]: [facility]  Service: Plastics;  Laterality: Bilateral;   CHOLECYSTECTOMY  1973   ESOPHAGOGASTRODUODENOSCOPY N/A 10/25/2020   Procedure: ESOPHAGOGASTRODUODENOSCOPY (EGD);  Surgeon: MClarene Essex MD;  [facility];  Service: Endoscopy;  Laterality: N/A;  enteroscopy   HAND SURGERY  08/2012; 11/2012   HOT  HEMOSTASIS N/A 10/25/2020   Procedure: HOT HEMOSTASIS (ARGON PLASMA COAGULATION/BICAP);  Surgeon: Clarene Essex, MD;  [facility];  Service: Endoscopy;  Laterality: N/A;   INTRAOPERATIVE TRANSTHORACIC ECHOCARDIOGRAM N/A 05/13/2022   Procedure: INTRAOPERATIVE TRANSTHORACIC ECHOCARDIOGRAM;  Surgeon: Burnell Blanks, MD;  Location: [place] [facility];  Service: Open Heart Surgery;  Laterality: N/A;   INTRAOPERATIVE TRANSTHORACIC ECHOCARDIOGRAM N/A 05/20/2022   Procedure: INTRAOPERATIVE TRANSTHORACIC ECHOCARDIOGRAM;  Surgeon: Early Osmond, MD;  Location: [place];  Service: Open Heart Surgery;  Laterality: N/A;   IR RADIOLOGIST EVAL & MGMT   07/27/2017   IR SACROPLASTY BILATERAL  07/31/2017   IR VERTEBROPLASTY CERV/THOR BX INC UNI/BIL INC/INJECT/IMAGING  03/13/2020   IR VERTEBROPLASTY EA ADDL (T&LS) BX INC UNI/BIL INC INJECT/IMAGING  03/14/2020   MELANOMA EXCISION     at 31 yrs of age   ORIF HIP FRACTURE Right 02/22/2014   Procedure: OPEN REDUCTION INTERNAL FIXATION RIGHT HIP;  Surgeon: Sanjuana Kava, MD;  [facility]: [facility];  Service: Orthopedics;  Laterality: Right;   PACEMAKER IMPLANT N/A 05/27/2022   Procedure: PACEMAKER IMPLANT;  Surgeon: Vickie Epley, MD;  Location: [place] [facility];  Service: Cardiovascular;  Laterality: N/A;   RIGHT HEART CATH AND CORONARY ANGIOGRAPHY N/A 03/28/2022   Procedure: RIGHT HEART CATH AND CORONARY ANGIOGRAPHY;  Surgeon: Early Osmond, MD;  Location: [place] [facility];  Service: Cardiovascular;  Laterality: N/A;   TOTAL ABDOMINAL HYSTERECTOMY  1974   TRANSCATHETER AORTIC VALVE REPLACEMENT, TRANSFEMORAL Left 05/13/2022   Procedure: Transcatheter Aortic Valve Replacement, Transfemoral;  Surgeon: Burnell Blanks, MD;  Location: [place] [facility];  Service: Open Heart Surgery;  Laterality: Left;   TRANSCATHETER AORTIC VALVE REPLACEMENT, TRANSFEMORAL Left 05/20/2022   Procedure: Transcatheter Aortic Valve Replacement, Transfemoral using an Edwards SAPIEN 3 Ultra 23 MM Heart Valve;  Surgeon: Early Osmond, MD;  Location: [place];  Service: Open Heart Surgery;  Laterality: Left;  Left Transfemoral    Current Medications: Current Meds  Medication Sig   acetaminophen (TYLENOL) 500 MG tablet Take 1,000 mg by mouth at bedtime.   apixaban (ELIQUIS) 2.5 MG TABS tablet Take 1 tablet (2.5 mg total) by mouth 2 (two) times daily.   CALCIUM PO Take 600 mg by mouth daily.   Cholecalciferol (VITAMIN D) 50 MCG (2000 UT) tablet Take 2,000 Units by mouth daily.   clonazePAM (KLONOPIN) 0.5 MG tablet Take 1 tablet (0.5 mg total) by mouth 2 (two) times daily as needed for anxiety.   ezetimibe (ZETIA) 10 MG  tablet Take 1 tablet (10 mg total) by mouth daily.   FLUoxetine (PROZAC) 40 MG capsule Take 1 capsule (40 mg total) by mouth daily.   fluticasone (FLONASE) 50 MCG/ACT nasal spray PLACE 2 SPRAYS INTO BOTH NOSTRILS DAILY (Patient taking differently: Place 2 sprays into both nostrils daily as needed for allergies.)   furosemide (LASIX) 20 MG tablet Take 1 tablet (20 mg total) by mouth daily.   Multiple Vitamins-Minerals (PRESERVISION AREDS 2+MULTI VIT PO) Take 1 capsule by mouth in the morning and at bedtime.   NIFEdipine (PROCARDIA XL/NIFEDICAL XL) 60 MG 24 hr tablet Take 1 tablet (60 mg total) by mouth daily.   omeprazole (PRILOSEC) 20 MG capsule TAKE 1 TABLET DAILY   PROLIA 60 MG/ML SOSY injection Inject 60 mg into the skin every 6 (six) months.   RESTASIS 0.05 % ophthalmic emulsion Place 1 drop into both eyes 2 (two) times daily.   sildenafil (REVATIO) 20 MG tablet Take 1 tablet (  20 mg total) by mouth 2 (two) times daily.   SYNTHROID 75 MCG tablet TAKE (1) TABLET DAILY BE- FORE BREAKFAST.   [DISCONTINUED] aspirin 81 MG chewable tablet Chew 1 tablet (81 mg total) by mouth daily.     Allergies:   Bactrim [sulfamethoxazole-trimethoprim], Dilaudid [hydromorphone hcl], Dilaudid [hydromorphone], Acyclovir and related, Crestor [rosuvastatin calcium], Gabapentin, Aleve [naproxen sodium], and Lyrica [pregabalin]   Social History   Socioeconomic History   Marital status: Married    Spouse name: Not on file   Number of children: 2   Years of education: Not on file   Highest education level: Some college, no degree  Occupational History   Occupation: retired Occupational psychologist at [employer]  Tobacco Use   Smoking status: Never   Smokeless tobacco: Never   Tobacco comments:    passive tobacco smoke exposure as child  Vaping Use   Vaping Use: Never used  Substance and Sexual Activity   Alcohol use: No   Drug use: No   Sexual activity: Not Currently    Birth control/protection: Surgical  Other  Topics Concern   Not on file  Social History Narrative   Regular exercise: walkingCaffeine use: 1 cup of coffee daily   Lives in split level home with her husband   Social Determinants of Health   Financial Resource Strain: [place]  (06/03/2022)   Overall Financial Resource Strain (CARDIA)    Difficulty of Paying Living Expenses: Not hard at all  Food Insecurity: No Food Insecurity (06/02/2022)   Hunger Vital Sign    Worried About Running Out of Food in the Last Year: Never true    [place] in the Last Year: Never true  Transportation Needs: No Transportation Needs (06/03/2022)   PRAPARE - Hydrologist (Medical): No    Lack of Transportation (Non-Medical): No  Physical Activity: Inactive (06/02/2022)   Exercise Vital Sign    Days of Exercise per Week: 0 days    Minutes of Exercise per Session: 0 min  Stress: No Stress Concern Present (06/02/2022)   [place]    Feeling of Stress : Only a little  Social Connections: Moderately Integrated (06/02/2022)   Social Connection and Isolation Panel [NHANES]    Frequency of Communication with Friends and Family: More than three times a week    Frequency of Social Gatherings with Friends and Family: More than three times a week    Attends Religious Services: More than 4 times per year    Active Member of [employer] or Organizations: No    Attends Music therapist: Never    Marital Status: Married     Family History: The patient's family history includes Cancer in her father; Hip fracture in her mother; Mental illness in her mother; Pancreatic cancer in her father; Rashes / Skin problems in her daughter; Raynaud syndrome in her father.  ROS:   Please see the history of present illness.    All other systems reviewed and are negative.  EKGs/Labs/Other Studies Reviewed:    The following studies were reviewed today:  HEART AND  VASCULAR CENTER  TAVR OPERATIVE NOTE     Date of Procedure:                05/20/2022   Preoperative Diagnosis:      Severe Aortic Stenosis    Postoperative Diagnosis:    Same    Procedure:  Transcatheter Aortic Valve Replacement - Transfemoral Approach             Edwards Sapien 3 Resilia THV (size 57m, model # [id number] serial # [id number])              Co-Surgeons:                         BGaye Pollack MD and ALenna Sciara MD Anesthesiologist:                  WRoderic Palau MD   Echocardiographer:              PJenkins Rouge MD   Pre-operative Echo Findings: Severe aortic stenosis Normal left ventricular systolic function   Post-operative Echo Findings: No paravalvular leak Normal left ventricular systolic function   Left Heart Catheterization Findings: Left ventricular end-diastolic pressure of 135TIRW __________________    Echo: 05/21/2022 1. Left ventricular ejection fraction, by estimation, is 60 to 65%. The  left ventricle has normal function. The left ventricle has no regional  wall motion abnormalities. There is moderate left ventricular hypertrophy.  Left ventricular diastolic  parameters are indeterminate.   2. Right ventricular systolic function is normal. The right ventricular  size is normal. There is normal pulmonary artery systolic pressure.   3. Left atrial size was mildly dilated.   4. Moderate functional MS due to MAC. The mitral valve is degenerative.  Trivial mitral valve regurgitation. Moderate mitral stenosis. Severe  mitral annular calcification.   5. Tricuspid valve regurgitation is moderate.   6. Post TAVR with 23 mm Sapien 3 valve implant 05/20/22 no PVL mean  gradient 11.5 mm peak 24.8 mm DVI 0.74 AVA 2.5 cm2 Trivial central AR .  The aortic valve has been repaired/replaced. Aortic valve regurgitation is  trivial. No aortic stenosis is  present.   7. The inferior vena cava is normal in size with greater than 50%   respiratory variability, suggesting right atrial pressure of 3 mmHg.    ___________________  Pacemaker Placement: 07/27/2021 CONCLUSIONS:  1. Severe AS s/p TAVR c/b complete heart block 2. Dual chamber permanent pacemaker with left bundle area lead 3. No early apparent complications.   EKG:  EKG is NOT ordered today.    Recent Labs: 02/13/2022: TSH 1.460 05/12/2022: ALT 12 05/25/2022: B Natriuretic Peptide 1,646.1 05/27/2022: Hemoglobin 9.8; Platelets 241 05/29/2022: BUN 7; Creatinine, Ser 0.92; Magnesium 1.7; Potassium 3.6; Sodium 140  Recent Lipid Panel    Component Value Date/Time   CHOL 146 02/13/2022 1445   TRIG 144 02/13/2022 1445   TRIG 176 (H) 09/04/2014 1419   HDL 50 02/13/2022 1445   HDL 44 09/04/2014 1419   CHOLHDL 2.9 02/13/2022 1445   LDLCALC 71 02/13/2022 1445   LDLCALC 92 11/29/2013 1718     Risk Assessment/Calculations:    CHA2DS2-VASc Score = 5   This indicates a 7.2% annual risk of stroke. The patient's score is based upon: CHF History: 1 HTN History: 1 Diabetes History: 0 Stroke History: 0 Vascular Disease History: 0 Age Score: 2 Gender Score: 1      Physical Exam:    VS:  BP (!) 164/60   Pulse 80   Ht _0  (1.575 m)   Wt 101 lb (45.8 kg)   SpO2 98%   BMI 18.47 kg/m     Wt Readings from Last 3 Encounters:  06/06/22 101 lb (45.8 kg)  06/02/22 100 lb (45.4 kg)  05/29/22 105 lb 6.4 oz (47.8 kg)     GEN: chronically ill appearing, frail HEENT: Normal NECK: No JVD LYMPHATICS: No lymphadenopathy CARDIAC: RRR, soft flow murmur. No rubs, gallops RESPIRATORY:  Clear to auscultation without rales, wheezing or rhonchi  ABDOMEN: Soft, non-tender, non-distended MUSCULOSKELETAL:  No edema; No deformity  SKIN: Warm and dry NEUROLOGIC:  Alert and oriented x 3 PSYCHIATRIC:  Normal affect   ASSESSMENT:    1. S/P TAVR (transcatheter aortic valve replacement)   2. S/P placement of cardiac pacemaker   3. Atrial flutter, unspecified type  ([place])   4. Chronic respiratory failure with hypoxia (HCC)   5. Chronic heart failure with preserved ejection fraction (HCC)   6. Iron deficiency anemia due to chronic blood loss   7. Mitral valve stenosis, unspecified etiology   8. [name] without gangrene   9. SCLERODERMA    PLAN:    In order of problems listed above:  Severe AS s/p TAVR: improving. Still gaining strength back from recent prolonged admission. Groin sites healing well. Stop aspirin. Continue Eliquis. SBE prophylaxis discussed; she wears full dentures. I will see her back for 1 month follow up and echo.    CHB: s/p PPM placement with an Assurity MRI on 05/27/2022 by Dr. Quentin Ore. Wound check completed today and apt for next week cancelled.    Atrial Flutter: new onset during most recent admission. Started on Eliquis 2.34m BID.    Chronic respiratory failure with hypoxia: seen by pulm during admission. There was concern for intersitial lung disease/CREST syndrome and it was recommended to pursue HRCT as an outpatient. Continue home 02. Sats have been good on 2L. [facility]  Chronic HFpEF: continue Lasix 239mdaily. Appears euvolemic. BMET today   Chronic Iron Deficiency Anemia: follows closely with GI and hematology as an outpatient with routine iron infusions. Transfused 1 unit PRBC on admission. Has labs (cbc/ferritin) with heme/onc 12/4   Moderate mitral stenosis with MAC: continue to follow with surveillance imaging as an outpatient.    H/o Raynaud's & scleroderma: Sildenafil continued. May need HRCT as an outpatient for suspected CREST syndrome. As above, refer to pulm     Cardiac Rehabilitation Eligibility Assessment  The patient is ready to start cardiac rehabilitation from a cardiac standpoint.       Medication Adjustments/Labs and Tests Ordered: Current medicines are reviewed at length with the patient today.  Concerns regarding medicines are outlined  above.  Orders Placed This Encounter  Procedures   Basic metabolic panel   Ambulatory referral to Pulmonology   No orders of the defined types were placed in this encounter.   Patient Instructions  Medication Instructions:  Stop Aspirin   *If you need a refill on your cardiac medications before your next appointment, please call your pharmacy*   Lab Work: Bmp - today   If you have labs (blood work) drawn today and your tests are completely normal, you will receive your results only by: [place] you have MyChart) OR A paper copy in the mail If you have any lab test that is abnormal or we need to change your treatment, we will call you to review the results.   Testing/Procedures: Your physician has referred you to see Pulmonology. Someone will contact you with an appointment    Follow-Up: Follow up as scheduled   Other Instructions   Important Information About Sugar         Signed, KaAngelena Form  PA-C  06/06/2022 3:39 PM    [facility]

## 2022-06-06 ENCOUNTER — Inpatient Hospital Stay: Payer: Medicare Other | Attending: Internal Medicine

## 2022-06-06 ENCOUNTER — Inpatient Hospital Stay: Payer: Medicare Other | Attending: Hematology | Admitting: Physician Assistant

## 2022-06-06 VITALS — BP 164/60 | HR 80 | Ht 62.0 in | Wt 101.0 lb

## 2022-06-06 DIAGNOSIS — I4892 Unspecified atrial flutter: Secondary | ICD-10-CM | POA: Diagnosis not present

## 2022-06-06 DIAGNOSIS — I5032 Chronic diastolic (congestive) heart failure: Secondary | ICD-10-CM | POA: Diagnosis not present

## 2022-06-06 DIAGNOSIS — Z952 Presence of prosthetic heart valve: Secondary | ICD-10-CM | POA: Insufficient documentation

## 2022-06-06 DIAGNOSIS — I442 Atrioventricular block, complete: Secondary | ICD-10-CM | POA: Insufficient documentation

## 2022-06-06 DIAGNOSIS — D500 Iron deficiency anemia secondary to blood loss (chronic): Secondary | ICD-10-CM | POA: Diagnosis not present

## 2022-06-06 DIAGNOSIS — Z950 Presence of cardiac pacemaker: Secondary | ICD-10-CM | POA: Insufficient documentation

## 2022-06-06 DIAGNOSIS — I7300 Raynaud's syndrome without gangrene: Secondary | ICD-10-CM | POA: Insufficient documentation

## 2022-06-06 DIAGNOSIS — I050 Rheumatic mitral stenosis: Secondary | ICD-10-CM | POA: Insufficient documentation

## 2022-06-06 DIAGNOSIS — M349 Systemic sclerosis, unspecified: Secondary | ICD-10-CM | POA: Diagnosis not present

## 2022-06-06 DIAGNOSIS — J9611 Chronic respiratory failure with hypoxia: Secondary | ICD-10-CM | POA: Insufficient documentation

## 2022-06-06 LAB — CUP PACEART INCLINIC DEVICE CHECK
Battery Remaining Longevity: 139 mo
Battery Voltage: 3.08 V
Brady Statistic RA Percent Paced: 5 %
Brady Statistic RV Percent Paced: 1.1 %
Date Time Interrogation Session: 20231201133939
Implantable Lead Connection Status: 753985
Implantable Lead Connection Status: 753985
Implantable Lead Implant Date: 20231121
Implantable Lead Implant Date: 20231121
Implantable Lead Location: 753859
Implantable Lead Location: 753860
Implantable Pulse Generator Implant Date: 20231121
Lead Channel Impedance Value: 337.5 Ohm
Lead Channel Impedance Value: 387.5 Ohm
Lead Channel Pacing Threshold Amplitude: 0.75 V
Lead Channel Pacing Threshold Amplitude: 0.75 V
Lead Channel Pacing Threshold Amplitude: 0.75 V
Lead Channel Pacing Threshold Amplitude: 0.75 V
Lead Channel Pacing Threshold Pulse Width: 0.4 ms
Lead Channel Pacing Threshold Pulse Width: 0.4 ms
Lead Channel Pacing Threshold Pulse Width: 0.5 ms
Lead Channel Pacing Threshold Pulse Width: 0.5 ms
Lead Channel Sensing Intrinsic Amplitude: 1.6 mV
Lead Channel Sensing Intrinsic Amplitude: 6.4 mV
Lead Channel Setting Pacing Amplitude: 1 V
Lead Channel Setting Pacing Amplitude: 5 V
Lead Channel Setting Pacing Pulse Width: 0.5 ms
Lead Channel Setting Sensing Sensitivity: 2 mV
Pulse Gen Model: 2272
Pulse Gen Serial Number: 6860174

## 2022-06-06 NOTE — Progress Notes (Signed)
Wound check appointment. Steri-strips removed. Wound without redness or edema. Incision edges approximated, wound well healed. Normal device function. Thresholds, sensing, and impedances consistent with implant measurements. Device programmed at 3.5V/auto capture programmed on for extra safety margin until 3 month visit. Histogram distribution appropriate for patient and level of activity. 1 AMS <30seconds in duration. No high ventricular rates noted. Patient educated about wound care, arm mobility, lifting restrictions. ROV in 3 months with implanting physician.

## 2022-06-06 NOTE — Patient Instructions (Addendum)
Medication Instructions:  Stop Aspirin   *If you need a refill on your cardiac medications before your next appointment, please call your pharmacy*   Lab Work: Bmp - today   If you have labs (blood work) drawn today and your tests are completely normal, you will receive your results only by: [place] (if you have MyChart) OR A paper copy in the mail If you have any lab test that is abnormal or we need to change your treatment, we will call you to review the results.   Testing/Procedures: Your physician has referred you to see Pulmonology. Someone will contact you with an appointment    Follow-Up: Follow up as scheduled   Other Instructions   Important Information About Sugar

## 2022-06-07 LAB — BASIC METABOLIC PANEL
BUN/Creatinine Ratio: 21 (ref 12–28)
BUN: 15 mg/dL (ref 8–27)
CO2: 22 mmol/L (ref 20–29)
Calcium: 9.1 mg/dL (ref 8.7–10.3)
Chloride: 101 mmol/L (ref 96–106)
Creatinine, Ser: 0.73 mg/dL (ref 0.57–1.00)
Glucose: 89 mg/dL (ref 70–99)
Potassium: 4.2 mmol/L (ref 3.5–5.2)
Sodium: 138 mmol/L (ref 134–144)
eGFR: 83 mL/min/{1.73_m2} (ref >59)

## 2022-06-09 ENCOUNTER — Inpatient Hospital Stay: Payer: Medicare Other | Attending: Hematology

## 2022-06-09 ENCOUNTER — Telehealth: Payer: Self-pay | Admitting: Physician Assistant

## 2022-06-09 DIAGNOSIS — K922 Gastrointestinal hemorrhage, unspecified: Secondary | ICD-10-CM | POA: Insufficient documentation

## 2022-06-09 DIAGNOSIS — D500 Iron deficiency anemia secondary to blood loss (chronic): Secondary | ICD-10-CM | POA: Diagnosis not present

## 2022-06-09 LAB — CBC WITH DIFFERENTIAL/PLATELET
Abs Immature Granulocytes: 0.03 10*3/uL (ref 0.00–0.07)
Basophils Absolute: 0.1 10*3/uL (ref 0.0–0.1)
Basophils Relative: 1 %
Eosinophils Absolute: 0.2 10*3/uL (ref 0.0–0.5)
Eosinophils Relative: 3 %
HCT: 21.7 % — ABNORMAL LOW (ref 36.0–46.0)
Hemoglobin: 6.6 g/dL — CL (ref 12.0–15.0)
Immature Granulocytes: 0 %
Lymphocytes Relative: 13 %
Lymphs Abs: 1.1 10*3/uL (ref 0.7–4.0)
MCH: 29.7 pg (ref 26.0–34.0)
MCHC: 30.4 g/dL (ref 30.0–36.0)
MCV: 97.7 fL (ref 80.0–100.0)
Monocytes Absolute: 0.5 10*3/uL (ref 0.1–1.0)
Monocytes Relative: 6 %
Neutro Abs: 6.3 10*3/uL (ref 1.7–7.7)
Neutrophils Relative %: 77 %
Platelets: 268 10*3/uL (ref 150–400)
RBC: 2.22 MIL/uL — ABNORMAL LOW (ref 3.87–5.11)
RDW: 16.3 % — ABNORMAL HIGH (ref 11.5–15.5)
WBC: 8.2 10*3/uL (ref 4.0–10.5)
nRBC: 0 % (ref 0.0–0.2)

## 2022-06-09 LAB — IRON AND TIBC
Iron: 37 ug/dL (ref 28–170)
Saturation Ratios: 10 % — ABNORMAL LOW (ref 10.4–31.8)
TIBC: 383 ug/dL (ref 250–450)
UIBC: 346 ug/dL

## 2022-06-09 LAB — FERRITIN: Ferritin: 42 ng/mL (ref 11–307)

## 2022-06-09 NOTE — Progress Notes (Unsigned)
CRITICAL VALUE ALERT Critical value received:  6.6 Date of notification:  06-09-22 Time of notification: 5277 Critical value read back:  Yes.   Nurse who received alert:  C. Lakasha Mcfall RN MD notified time and response:  Reb. Pennington PA-C , will give 2 units of blood today per orders.

## 2022-06-09 NOTE — Progress Notes (Unsigned)
Orders for 2 units of blood in per Reb. Pennington PA-C  CRITICAL VALUE ALERT Critical value received:  hgb 6.4 Date of notification:  06-10-22 Time of notification: 3979 Critical value read back:  Yes.   Nurse who received alert:  C. Raheel Kunkle RN MD notified time and response:  PA already aware, will give 2 units today per orders.

## 2022-06-09 NOTE — Telephone Encounter (Signed)
  Pt aware of (bmet) lab results ./cy

## 2022-06-09 NOTE — Telephone Encounter (Signed)
Patient returned call for lab results.  

## 2022-06-10 ENCOUNTER — Other Ambulatory Visit: Payer: Self-pay

## 2022-06-10 ENCOUNTER — Inpatient Hospital Stay: Payer: Medicare Other

## 2022-06-10 VITALS — BP 130/74 | HR 72 | Temp 98.2°F | Resp 18

## 2022-06-10 DIAGNOSIS — D500 Iron deficiency anemia secondary to blood loss (chronic): Secondary | ICD-10-CM

## 2022-06-10 DIAGNOSIS — K922 Gastrointestinal hemorrhage, unspecified: Secondary | ICD-10-CM | POA: Diagnosis not present

## 2022-06-10 DIAGNOSIS — D649 Anemia, unspecified: Secondary | ICD-10-CM

## 2022-06-10 LAB — PREPARE RBC (CROSSMATCH)

## 2022-06-10 LAB — CBC
HCT: 20.7 % — ABNORMAL LOW (ref 36.0–46.0)
Hemoglobin: 6.4 g/dL — CL (ref 12.0–15.0)
MCH: 29.9 pg (ref 26.0–34.0)
MCHC: 30.9 g/dL (ref 30.0–36.0)
MCV: 96.7 fL (ref 80.0–100.0)
Platelets: 287 10*3/uL (ref 150–400)
RBC: 2.14 MIL/uL — ABNORMAL LOW (ref 3.87–5.11)
RDW: 16.3 % — ABNORMAL HIGH (ref 11.5–15.5)
WBC: 8.3 10*3/uL (ref 4.0–10.5)
nRBC: 0 % (ref 0.0–0.2)

## 2022-06-10 MED ORDER — DIPHENHYDRAMINE HCL 25 MG PO CAPS
25.0000 mg | ORAL_CAPSULE | Freq: Once | ORAL | Status: AC
  Administered 2022-06-10: 25 mg via ORAL
  Filled 2022-06-10: qty 1

## 2022-06-10 MED ORDER — ACETAMINOPHEN 325 MG PO TABS
650.0000 mg | ORAL_TABLET | Freq: Once | ORAL | Status: AC
  Administered 2022-06-10: 650 mg via ORAL
  Filled 2022-06-10: qty 2

## 2022-06-10 MED ORDER — SODIUM CHLORIDE 0.9% IV SOLUTION
250.0000 mL | Freq: Once | INTRAVENOUS | Status: AC
  Administered 2022-06-10: 250 mL via INTRAVENOUS

## 2022-06-10 NOTE — Progress Notes (Signed)
Patient tolerated 2 units of PRBCs with no complaints voiced. Peripheral IV site clean and dry with good blood return noted before and after infusion. Band aid applied. VSS with discharge and left in satisfactory condition with no s/s of distress noted.

## 2022-06-10 NOTE — Patient Instructions (Signed)
[facility]  Discharge Instructions: Thank you for choosing [facility] to provide your oncology and hematology care.  If you have a lab appointment with the Cancer Center, please come in thru the Main Entrance and check in at the main information desk.  Wear comfortable clothing and clothing appropriate for easy access to any Portacath or PICC line.   We strive to give you quality time with your provider. You may need to reschedule your appointment if you arrive late (15 or more minutes).  Arriving late affects you and other patients whose appointments are after yours.  Also, if you miss three or more appointments without notifying the office, you may be dismissed from the clinic at the provider's discretion.      For prescription refill requests, have your pharmacy contact our office and allow 72 hours for refills to be completed.    Today you received the following 2 units of PRBCs, return as scheduled.    To help prevent nausea and vomiting after your treatment, we encourage you to take your nausea medication as directed.  BELOW ARE SYMPTOMS THAT SHOULD BE REPORTED IMMEDIATELY: *FEVER GREATER THAN 100.4 F (38 C) OR HIGHER *CHILLS OR SWEATING *NAUSEA AND VOMITING THAT IS NOT CONTROLLED WITH YOUR NAUSEA MEDICATION *UNUSUAL SHORTNESS OF BREATH *UNUSUAL BRUISING OR BLEEDING *URINARY PROBLEMS (pain or burning when urinating, or frequent urination) *BOWEL PROBLEMS (unusual diarrhea, constipation, pain near the anus) TENDERNESS IN MOUTH AND THROAT WITH OR WITHOUT PRESENCE OF ULCERS (sore throat, sores in mouth, or a toothache) UNUSUAL RASH, SWELLING OR PAIN  UNUSUAL VAGINAL DISCHARGE OR ITCHING   Items with * indicate a potential emergency and should be followed up as soon as possible or go to the Emergency Department if any problems should occur.  Please show the CHEMOTHERAPY ALERT CARD or IMMUNOTHERAPY ALERT CARD at check-in to the Emergency Department  and triage nurse.  Should you have questions after your visit or need to cancel or reschedule your appointment, please contact [facility] [phone]  and follow the prompts.  Office hours are 8:00 a.m. to 4:30 p.m. Monday - Friday. Please note that voicemails left after 4:00 p.m. may not be returned until the following business day.  We are closed weekends and major holidays. You have access to a nurse at all times for urgent questions. Please call the main number to the clinic [phone] and follow the prompts.  For any non-urgent questions, you may also contact your provider using MyChart. We now offer e-Visits for anyone 18 and older to request care online for non-urgent symptoms. For details visit [email or web address].   Also download the MyChart app! Go to the app store, search "MyChart", open the app, select [place], and log in with your MyChart username and password.  Masks are optional in the cancer centers. If you would like for your care team to wear a mask while they are taking care of you, please let them know. You may have one support person who is at least 80 years old accompany you for your appointments.  

## 2022-06-12 ENCOUNTER — Ambulatory Visit: Payer: Medicare Other

## 2022-06-13 ENCOUNTER — Other Ambulatory Visit: Payer: Self-pay | Admitting: Physician Assistant

## 2022-06-14 LAB — TYPE AND SCREEN
ABO/RH(D): O POS
Antibody Screen: NEGATIVE
Unit division: 0
Unit division: 0
Unit division: 0

## 2022-06-14 LAB — BPAM RBC
Blood Product Expiration Date: 202312282359
Blood Product Expiration Date: 202401052359
Blood Product Expiration Date: 202401062359
ISSUE DATE / TIME: 202312051116
ISSUE DATE / TIME: 202312051259
Unit Type and Rh: 5100
Unit Type and Rh: 5100
Unit Type and Rh: 5100

## 2022-06-17 ENCOUNTER — Inpatient Hospital Stay: Payer: Medicare Other

## 2022-06-17 ENCOUNTER — Inpatient Hospital Stay (HOSPITAL_BASED_OUTPATIENT_CLINIC_OR_DEPARTMENT_OTHER): Payer: Medicare Other | Admitting: Physician Assistant

## 2022-06-17 VITALS — BP 117/46 | HR 58 | Temp 97.6°F | Resp 18 | Ht 62.0 in | Wt 98.7 lb

## 2022-06-17 DIAGNOSIS — K922 Gastrointestinal hemorrhage, unspecified: Secondary | ICD-10-CM | POA: Diagnosis not present

## 2022-06-17 DIAGNOSIS — D500 Iron deficiency anemia secondary to blood loss (chronic): Secondary | ICD-10-CM

## 2022-06-17 DIAGNOSIS — D649 Anemia, unspecified: Secondary | ICD-10-CM

## 2022-06-17 MED ORDER — SODIUM CHLORIDE 0.9 % IV SOLN
300.0000 mg | Freq: Once | INTRAVENOUS | Status: AC
  Administered 2022-06-17: 300 mg via INTRAVENOUS
  Filled 2022-06-17: qty 300

## 2022-06-17 MED ORDER — LORATADINE 10 MG PO TABS
10.0000 mg | ORAL_TABLET | Freq: Once | ORAL | Status: AC
  Administered 2022-06-17: 10 mg via ORAL
  Filled 2022-06-17: qty 1

## 2022-06-17 MED ORDER — SODIUM CHLORIDE 0.9 % IV SOLN: Freq: Once | INTRAVENOUS | Status: AC

## 2022-06-17 MED ORDER — ACETAMINOPHEN 325 MG PO TABS
650.0000 mg | ORAL_TABLET | Freq: Once | ORAL | Status: AC
  Administered 2022-06-17: 650 mg via ORAL
  Filled 2022-06-17: qty 2

## 2022-06-17 NOTE — Progress Notes (Unsigned)
Haley Trevino, [place] [address]   CLINIC:  Medical Oncology/Hematology  PCP:  [name], [place] [address] [phone]   REASON FOR VISIT:  Follow-up for iron deficiency anemia  CURRENT THERAPY: IV iron infusions with intermittent PRBC transfusions  INTERVAL HISTORY:  Haley Trevino 81 y.o. female returns for routine follow-up of her iron deficiency anemia.  She was last evaluated via telemedicine visit by Tarri Abernethy PA-C on 01/27/2022.  Since her last visit in July 2023, she has received IV Venofer 300 mg x 10 doses, most recent dose given today during this visit. She had TAVR on 05/20/2022, and was hospitalized from 05/13/2022 through 05/29/2022 (PRBC transfusion x 1 on 05/13/2022); hemoglobin was stable at discharge around 9.8.   Routine hematology labs done last week showed severe anemia with Hgb 6.6 on 06/09/2022, therefore transfused PRBC x 2 units.  Patient reports that after TAVR, she was discharged with a prescription for Eliquis.  Within 1 to 2 days of starting Eliquis, she reports profuse melanotic stool.  She reports that she was unable to get in touch with her cardiology team from the hospital, and made the decision to stop her Eliquis herself after about 5 to 7 days of black tarry bowel movements.  She reports that within 24 hours of stopping Eliquis her bowel movements had returned to normal.  She reports that she feels quite poorly.  She reports severe fatigue, dizziness, and tremors as well as worsening of her chronic dyspnea on exertion.  She has not had any exertional chest pain, but does continue to have chronic musculoskeletal chest wall pain secondary to scleroderma and scarring after removal of breast implants.  No syncopal episodes.  She reports losing weight due to poor appetite and avoiding food because she "wants to let her stomach heal" from the bleeding.  She also reports new  diagnosis of CHF during her hospital stay.  She has little to no energy and 25% appetite. She endorses that she is maintaining a stable weight.   REVIEW OF SYSTEMS: Review of Systems  Constitutional:  Positive for appetite change and fatigue. Negative for chills, diaphoresis, fever and unexpected weight change.  HENT:   Negative for lump/mass and nosebleeds.   Eyes:  Negative for eye problems.  Respiratory:  Positive for shortness of breath. Negative for cough and hemoptysis.   Cardiovascular:  Positive for chest pain. Negative for leg swelling and palpitations.  Gastrointestinal:  Positive for blood in stool. Negative for abdominal pain, constipation, diarrhea, nausea and vomiting.  Genitourinary:  Negative for hematuria.   Skin: Negative.   Neurological:  Positive for dizziness, headaches and light-headedness.  Hematological:  Does not bruise/bleed easily.  Psychiatric/Behavioral:  The patient is nervous/anxious.       PAST MEDICAL/SURGICAL HISTORY:  Past Medical History:  Diagnosis Date   Cataract    Depression    GERD (gastroesophageal reflux disease)    Hyperlipidemia    Hypertension    Hypothyroidism    IBS (irritable bowel syndrome)    Osteoarthritis    Raynaud disease    S/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 Ali Lowe & Bartle   Scleroderma [facility])    Thyroid disease    hypothyroidism   Past Surgical History:  Procedure Laterality Date   BREAST ENHANCEMENT SURGERY  1975   BREAST IMPLANT REMOVAL Bilateral 04/23/2016  Procedure: REMOVALBILATERAL BREAST IMPLANTS;  Surgeon: Wallace Going, DO;  Location: [place];  Service: Plastics;  Laterality: Bilateral;   CHOLECYSTECTOMY  1973   ESOPHAGOGASTRODUODENOSCOPY N/A 10/25/2020   Procedure: ESOPHAGOGASTRODUODENOSCOPY (EGD);  Surgeon: Clarene Essex, MD;  Location: [facility];  Service: Endoscopy;  Laterality: N/A;   enteroscopy   HAND SURGERY  08/2012; 11/2012   HOT HEMOSTASIS N/A 10/25/2020   Procedure: HOT HEMOSTASIS (ARGON PLASMA COAGULATION/BICAP);  Surgeon: Clarene Essex, MD;  Location: [facility];  Service: Endoscopy;  Laterality: N/A;   INTRAOPERATIVE TRANSTHORACIC ECHOCARDIOGRAM N/A 05/13/2022   Procedure: INTRAOPERATIVE TRANSTHORACIC ECHOCARDIOGRAM;  Surgeon: Burnell Blanks, MD;  Location: [place] [facility];  Service: Open Heart Surgery;  Laterality: N/A;   INTRAOPERATIVE TRANSTHORACIC ECHOCARDIOGRAM N/A 05/20/2022   Procedure: INTRAOPERATIVE TRANSTHORACIC ECHOCARDIOGRAM;  Surgeon: Early Osmond, MD;  Location: [place] [facility];  Service: Open Heart Surgery;  Laterality: N/A;   IR RADIOLOGIST EVAL & MGMT  07/27/2017   IR SACROPLASTY BILATERAL  07/31/2017   IR VERTEBROPLASTY CERV/THOR BX INC UNI/BIL INC/INJECT/IMAGING  03/13/2020   IR VERTEBROPLASTY EA ADDL (T&LS) BX INC UNI/BIL INC INJECT/IMAGING  03/14/2020   MELANOMA EXCISION     at 31 yrs of age   ORIF HIP FRACTURE Right 02/22/2014   Procedure: OPEN REDUCTION INTERNAL FIXATION RIGHT HIP;  Surgeon: Sanjuana Kava, MD;  Location: [facility];  Service: Orthopedics;  Laterality: Right;   PACEMAKER IMPLANT N/A 05/27/2022   Procedure: PACEMAKER IMPLANT;  Surgeon: Vickie Epley, MD;  Location: [place] [facility];  Service: Cardiovascular;  Laterality: N/A;   RIGHT HEART CATH AND CORONARY ANGIOGRAPHY N/A 03/28/2022   Procedure: RIGHT HEART CATH AND CORONARY ANGIOGRAPHY;  Surgeon: Early Osmond, MD;  Location: [place] [facility];  Service: Cardiovascular;  Laterality: N/A;   TOTAL ABDOMINAL HYSTERECTOMY  1974   TRANSCATHETER AORTIC VALVE REPLACEMENT, TRANSFEMORAL Left 05/13/2022   Procedure: Transcatheter Aortic Valve Replacement, Transfemoral;  Surgeon: Burnell Blanks, MD;  Location: [place] [facility];  Service: Open Heart Surgery;  Laterality: Left;   TRANSCATHETER AORTIC VALVE REPLACEMENT, TRANSFEMORAL Left 05/20/2022   Procedure: Transcatheter  Aortic Valve Replacement, Transfemoral using an Edwards SAPIEN 3 Ultra 23 MM Heart Valve;  Surgeon: Early Osmond, MD;  Location: [place];  Service: Open Heart Surgery;  Laterality: Left;  Left Transfemoral     SOCIAL HISTORY:  Social History   Socioeconomic History   Marital status: Married    Spouse name: Not on file   Number of children: 2   Years of education: Not on file   Highest education level: Some college, no degree  Occupational History   Occupation: retired Occupational psychologist at [employer]  Tobacco Use   Smoking status: Never   Smokeless tobacco: Never   Tobacco comments:    passive tobacco smoke exposure as child  Vaping Use   Vaping Use: Never used  Substance and Sexual Activity   Alcohol use: No   Drug use: No   Sexual activity: Not Currently    Birth control/protection: Surgical  Other Topics Concern   Not on file  Social History Narrative   Regular exercise: walkingCaffeine use: 1 cup of coffee daily   Lives in split level home with her husband   Social Determinants of Health   Financial Resource Strain: Pinch  (06/03/2022)   Overall Financial Resource Strain (CARDIA)    Difficulty of Paying Living Expenses: Not hard at all  Food Insecurity: No Food Insecurity (06/02/2022)   Hunger Vital  Sign    Worried About Charity fundraiser in the Last Year: Never true    [place] in the Last Year: Never true  Transportation Needs: No Transportation Needs (06/03/2022)   PRAPARE - Hydrologist (Medical): No    Lack of Transportation (Non-Medical): No  Physical Activity: Inactive (06/02/2022)   Exercise Vital Sign    Days of Exercise per Week: 0 days    Minutes of Exercise per Session: 0 min  Stress: No Stress Concern Present (06/02/2022)   [place]    Feeling of Stress : Only a little  Social Connections: Moderately Integrated (06/02/2022)   Social Connection  and Isolation Panel [NHANES]    Frequency of Communication with Friends and Family: More than three times a week    Frequency of Social Gatherings with Friends and Family: More than three times a week    Attends Religious Services: More than 4 times per year    Active Member of [employer] or Organizations: No    Attends Archivist Meetings: Never    Marital Status: Married  Human resources officer Violence: Not At Risk (06/02/2022)   Humiliation, Afraid, Rape, and Kick questionnaire    Fear of Current or Ex-Partner: No    Emotionally Abused: No    Physically Abused: No    Sexually Abused: No    FAMILY HISTORY:  Family History  Problem Relation Age of Onset   Pancreatic cancer Father    Raynaud syndrome Father    Cancer Father        pancreatic   Rashes / Skin problems Daughter        possibly scleraderma   Hip fracture Mother    Mental illness Mother        attempted suicide at 25 yo    CURRENT MEDICATIONS:  Outpatient Encounter Medications as of 06/17/2022  Medication Sig Note   acetaminophen (TYLENOL) 500 MG tablet Take 1,000 mg by mouth at bedtime.    CALCIUM PO Take 600 mg by mouth daily.    Cholecalciferol (VITAMIN D) 50 MCG (2000 UT) tablet Take 2,000 Units by mouth daily.    clonazePAM (KLONOPIN) 0.5 MG tablet Take 1 tablet (0.5 mg total) by mouth 2 (two) times daily as needed for anxiety.    ezetimibe (ZETIA) 10 MG tablet Take 1 tablet (10 mg total) by mouth daily.    FLUoxetine (PROZAC) 40 MG capsule Take 1 capsule (40 mg total) by mouth daily.    fluticasone (FLONASE) 50 MCG/ACT nasal spray PLACE 2 SPRAYS INTO BOTH NOSTRILS DAILY (Patient taking differently: Place 2 sprays into both nostrils daily as needed for allergies.)    furosemide (LASIX) 20 MG tablet Take 1 tablet (20 mg total) by mouth daily.    Multiple Vitamins-Minerals (PRESERVISION AREDS 2+MULTI VIT PO) Take 1 capsule by mouth in the morning and at bedtime.    NIFEdipine (PROCARDIA XL/NIFEDICAL XL) 60 MG  24 hr tablet Take 1 tablet (60 mg total) by mouth daily.    omeprazole (PRILOSEC) 20 MG capsule TAKE 1 TABLET DAILY    PROLIA 60 MG/ML SOSY injection Inject 60 mg into the skin every 6 (six) months.    RESTASIS 0.05 % ophthalmic emulsion Place 1 drop into both eyes 2 (two) times daily.    sildenafil (REVATIO) 20 MG tablet Take 1 tablet (20 mg total) by mouth 2 (two) times daily.    SYNTHROID 75 MCG  tablet TAKE (1) TABLET DAILY BE- FORE BREAKFAST. 05/08/2022: NAME BRAND ONLY    [DISCONTINUED] apixaban (ELIQUIS) 2.5 MG TABS tablet Take 1 tablet (2.5 mg total) by mouth 2 (two) times daily.    Facility-Administered Encounter Medications as of 06/17/2022  Medication   [COMPLETED] 0.9 %  sodium chloride infusion   [COMPLETED] acetaminophen (TYLENOL) tablet 650 mg   iron sucrose (VENOFER) 300 mg in sodium chloride 0.9 % 250 mL IVPB   [COMPLETED] loratadine (CLARITIN) tablet 10 mg    ALLERGIES:  Allergies  Allergen Reactions   Bactrim [Sulfamethoxazole-Trimethoprim] Nausea And Vomiting   Dilaudid [Hydromorphone Hcl] Anaphylaxis   Dilaudid [Hydromorphone] Anaphylaxis   Acyclovir And Related     Unknown reaction   Crestor [Rosuvastatin Calcium] Other (See Comments)    Muscle pain   Gabapentin Swelling    Swelling in feet, and in legs   Aleve [Naproxen Sodium] Rash   Lyrica [Pregabalin] Other (See Comments)    Swelling in feet, and in legs     PHYSICAL EXAM:  ECOG PERFORMANCE STATUS: 3 - Symptomatic, >50% confined to bed  There were no vitals filed for this visit. There were no vitals filed for this visit. Physical Exam Constitutional:      Appearance: Normal appearance. She is underweight. She is ill-appearing.     Comments: Patient appears extremely fatigued and weak compared to her last office visit.  HENT:     Head: Normocephalic and atraumatic.     Mouth/Throat:     Mouth: Mucous membranes are moist.  Eyes:     Extraocular Movements: Extraocular movements intact.     Pupils:  Pupils are equal, round, and reactive to light.  Cardiovascular:     Rate and Rhythm: Normal rate and regular rhythm.     Pulses: Normal pulses.     Heart sounds: Normal heart sounds.  Pulmonary:     Effort: Pulmonary effort is normal.     Breath sounds: Normal breath sounds.  Abdominal:     General: Bowel sounds are normal.     Palpations: Abdomen is soft.     Tenderness: There is no abdominal tenderness.  Musculoskeletal:        General: No swelling.     Right lower leg: No edema.     Left lower leg: No edema.  Lymphadenopathy:     Cervical: No cervical adenopathy.  Skin:    General: Skin is warm and dry.     Coloration: Skin is pale.  Neurological:     General: No focal deficit present.     Mental Status: She is alert and oriented to person, place, and time.  Psychiatric:        Mood and Affect: Mood normal.        Behavior: Behavior normal.      LABORATORY DATA:  I have reviewed the labs as listed.  CBC    Component Value Date/Time   WBC 8.3 06/10/2022 0939   RBC 2.14 (L) 06/10/2022 0939   HGB 6.4 (LL) 06/10/2022 0939   HGB 8.6 (L) 03/27/2022 1642   HCT 20.7 (L) 06/10/2022 0939   HCT 27.5 (L) 03/27/2022 1642   PLT 287 06/10/2022 0939   PLT 316 03/27/2022 1642   MCV 96.7 06/10/2022 0939   MCV 95 03/27/2022 1642   MCH 29.9 06/10/2022 0939   MCHC 30.9 06/10/2022 0939   RDW 16.3 (H) 06/10/2022 0939   RDW 13.9 03/27/2022 1642   LYMPHSABS 1.1 06/09/2022 1056   LYMPHSABS 1.2  02/13/2022 1445   MONOABS 0.5 06/09/2022 1056   EOSABS 0.2 06/09/2022 1056   EOSABS 0.1 02/13/2022 1445   BASOSABS 0.1 06/09/2022 1056   BASOSABS 0.0 02/13/2022 1445      Latest Ref Rng & Units 06/06/2022    1:38 PM 05/29/2022   12:48 AM 05/28/2022    3:31 PM  CMP  Glucose 70 - 99 mg/dL 89  104  119   BUN 8 - 27 mg/dL '15  7  9   '$ Creatinine 0.57 - 1.00 mg/dL 0.73  0.92  0.91   Sodium 134 - 144 mmol/L 138  140  138   Potassium 3.5 - 5.2 mmol/L 4.2  3.6  2.6   Chloride 96 - 106 mmol/L  101  103  104   CO2 20 - 29 mmol/L '22  26  27   '$ Calcium 8.7 - 10.3 mg/dL 9.1  8.7  8.6     DIAGNOSTIC IMAGING:  I have independently reviewed the relevant imaging and discussed with the patient.  ASSESSMENT & PLAN: 1.  Iron deficiency anemia - Initially referred to hematology by PCP after labs obtained were 09/21/2020 significant for normocytic anemia (Hgb 10.2, MCV 83.0) and iron deficiency (ferritin 13) - EGD (10/25/2020): 2 angiodysplastic lesions without bleeding in the duodenum, another angiodysplastic lesion in duodenum; treated by [facility] - Capsule study (11/29/2020): At least 3 proximal and mid small bowel AVMs, nonbleeding, but also with some possible red material in the proximal colon - Other work-up: SPEP/IFE/light chains unremarkable; LDH normal; CMP unremarkable  - Blood transfusion: PRBC x1 on 12/14/2020 due to Hgb 7.0  - Since her last visit in July 2023, she has received IV Venofer 300 mg x 10 doses, most recent dose given today during this visit. - She had TAVR on 05/20/2022, and was hospitalized from 05/13/2022 through 05/29/2022 (PRBC transfusion x 1 on 05/13/2022); hemoglobin was stable at discharge around 9.8. - Reports that she was started on Eliquis after TAVR, developed profuse melanotic stool within 48 hours of starting Eliquis.  She reports that she was unable to get in touch with her cardiology team from the hospital, and made the decision to stop her Eliquis herself after about 5 to 7 days of black tarry bowel movements.  She reports that within 24 hours of stopping Eliquis her bowel movements had returned to normal. - Routine hematology labs done last week showed severe anemia with Hgb 6.6 on 06/09/2022, therefore transfused PRBC x 2 units. - She continues to follow with GI (Dr. Watt Climes via [facility]) - Most recent labs (06/09/2022): Hgb 6.6/MCV 97.7, ferritin 42, iron saturation 10% - Differential diagnosis favors iron deficiency anemia secondary to chronic occult GI  blood loss; may also be an aspect of malabsorption in the setting of scleroderma, low meat intake, and use of PPI - PLAN: Recommend IV Venofer 300 mg x 4 (first dose given today) - CBC + BB sample every 2 weeks for possible transfusion - Same-day labs (CBC/D, BB sample, ferritin, iron/TIBC, BMP) and RTC in 8 to 10 weeks - I have sent urgent message to GI and cardiology teams for their input regarding Eliquis and additional workup of GI blood loss. - Until she is told otherwise by cardiology/gastroenterology, I recommended the patient continue to HOLD her Eliquis, but that she should restart her aspirin 81 mg daily. - Patient is aware of alarm symptoms that should prompt immediate medical attention.  In particular, she has been instructed that if she has recurrent episodes  of melanotic stool, she should proceed immediately to the emergency department.   PLAN SUMMARY: >> IV Venofer 300 mg x 4 >> CBC + BB sample every 2 weeks with possible transfusion >> Same-day labs (CBC/D, BB sample, ferritin, iron/TIBC, BMP) and RTC in 8 to 10 weeks >> Follow-up with cardiology and GI   All questions were answered. The patient knows to call the clinic with any problems, questions or concerns.  Medical decision making: Moderate  Time spent on visit: I spent 25 minutes counseling the patient face to face. The total time spent in the appointment was 40 minutes and more than 50% was on counseling.   Harriett Rush, PA-C  06/18/2022 11:49 PM

## 2022-06-17 NOTE — Patient Instructions (Signed)
[facility] **   You were seen today by Tarri Abernethy PA-C for your iron deficiency anemia.    IRON DEFICIENCY ANEMIA: Your severe anemia and iron deficiency are most likely related to bleeding from your stomach or intestines. Do NOT restart your Eliquis at this time, since this was likely part of why you are having severe bleeding. I do recommend that you RESTART aspirin 81 mg daily.  However, stop taking aspirin and seek immediate medical attention if you start having any black tarry bowel movements while taking it. I will send an urgent message to your cardiologist and your GI doctor so that they can address whether or not you should be on Eliquis and whether or not you need additional EGD or colonoscopy. From hematology standpoint we will schedule you for 3 more doses of IV iron (in addition to the dose she received today).  We will also check your blood count every other week to make sure you do not have any signs of rapid blood loss.  **IMPORTANT: If you have large amounts of black tarry bowel movements like you did when you were taking Eliquis, proceed immediately to the emergency department rather than waiting to contact your doctors office.  FOLLOW-UP APPOINTMENT: We will check hematology labs and see you for follow-up again in 2 months.  ** Thank you for trusting me with your healthcare!  I strive to provide all of my patients with quality care at each visit.  If you receive a survey for this visit, I would be so grateful to you for taking the time to provide feedback.  Thank you in advance!  ~ Yamaira Spinner                   Dr. Derek Jack   &   Tarri Abernethy, PA-C   - - - - - - - - - - - - - - - - - -    Thank you for choosing [facility] to provide your oncology and hematology care.  To afford each patient quality time with our provider, please arrive at  least 15 minutes before your scheduled appointment time.   If you have a lab appointment with the [facility] please come in thru the Main Entrance and check in at the main information desk.  You need to re-schedule your appointment should you arrive 10 or more minutes late.  We strive to give you quality time with our providers, and arriving late affects you and other patients whose appointments are after yours.  Also, if you no show three or more times for appointments you may be dismissed from the clinic at the providers discretion.     Again, thank you for choosing [facility].  Our hope is that these requests will decrease the amount of time that you wait before being seen by our physicians.       _____________________________________________________________  Should you have questions after your visit to [facility], please contact our office at [phone] and follow the prompts.  Our office hours are 8:00 a.m. and 4:30 p.m. Monday - Friday.  Please note that voicemails left after 4:00 p.m. may not be returned until the following business day.  We are closed weekends and major holidays.  You do have access to a nurse 24-7, just call the main number to the clinic [phone] and do  not press any options, hold on the line and a nurse will answer the phone.    For prescription refill requests, have your pharmacy contact our office and allow 72 hours.

## 2022-06-17 NOTE — Progress Notes (Signed)
Patient presents today for iron infusion.  Patient is in satisfactory condition with no new complaints voiced.  Vital signs are stable.  We will proceed with infusion per provider orders.   Patient tolerated treatment well with no complaints voiced.  Patient left via wheelchair with husband in stable condition.  Vital signs stable at discharge.  Follow up as scheduled.

## 2022-06-17 NOTE — Progress Notes (Unsigned)
Patient presents today for iron infusion.  Patient is in satisfactory condition with no new complaints voiced.  Vital signs are stable.  We will proceed with infusion per provider orders.

## 2022-06-17 NOTE — Patient Instructions (Signed)
[facility]  Discharge Instructions: Thank you for choosing [facility] to provide your oncology and hematology care.  If you have a lab appointment with the Cancer Center, please come in thru the Main Entrance and check in at the main information desk.  Wear comfortable clothing and clothing appropriate for easy access to any Portacath or PICC line.   We strive to give you quality time with your provider. You may need to reschedule your appointment if you arrive late (15 or more minutes).  Arriving late affects you and other patients whose appointments are after yours.  Also, if you miss three or more appointments without notifying the office, you may be dismissed from the clinic at the provider's discretion.      For prescription refill requests, have your pharmacy contact our office and allow 72 hours for refills to be completed.     To help prevent nausea and vomiting after your treatment, we encourage you to take your nausea medication as directed.  BELOW ARE SYMPTOMS THAT SHOULD BE REPORTED IMMEDIATELY: *FEVER GREATER THAN 100.4 F (38 C) OR HIGHER *CHILLS OR SWEATING *NAUSEA AND VOMITING THAT IS NOT CONTROLLED WITH YOUR NAUSEA MEDICATION *UNUSUAL SHORTNESS OF BREATH *UNUSUAL BRUISING OR BLEEDING *URINARY PROBLEMS (pain or burning when urinating, or frequent urination) *BOWEL PROBLEMS (unusual diarrhea, constipation, pain near the anus) TENDERNESS IN MOUTH AND THROAT WITH OR WITHOUT PRESENCE OF ULCERS (sore throat, sores in mouth, or a toothache) UNUSUAL RASH, SWELLING OR PAIN  UNUSUAL VAGINAL DISCHARGE OR ITCHING   Items with * indicate a potential emergency and should be followed up as soon as possible or go to the Emergency Department if any problems should occur.  Please show the CHEMOTHERAPY ALERT CARD or IMMUNOTHERAPY ALERT CARD at check-in to the Emergency Department and triage nurse.  Should you have questions after your visit or need to  cancel or reschedule your appointment, please contact [facility] [phone]  and follow the prompts.  Office hours are 8:00 a.m. to 4:30 p.m. Monday - Friday. Please note that voicemails left after 4:00 p.m. may not be returned until the following business day.  We are closed weekends and major holidays. You have access to a nurse at all times for urgent questions. Please call the main number to the clinic [phone] and follow the prompts.  For any non-urgent questions, you may also contact your provider using MyChart. We now offer e-Visits for anyone 18 and older to request care online for non-urgent symptoms. For details visit [email or web address].   Also download the MyChart app! Go to the app store, search "MyChart", open the app, select [facility], and log in with your MyChart username and password.  Masks are optional in the cancer centers. If you would like for your care team to wear a mask while they are taking care of you, please let them know. You may have one support person who is at least 80 years old accompany you for your appointments.  

## 2022-06-18 ENCOUNTER — Encounter: Payer: Self-pay | Admitting: Hematology

## 2022-06-18 NOTE — Progress Notes (Unsigned)
[facility] [place]                                     Cardiology Office Note:    Date:  06/23/2022   ID:  Haley Trevino, DOB Nov 19, 1940, MRN [id number]  PCP:  Haley Trevino, [facility] Cardiologist:  Haley Breeding, MD / Dr. Ali Trevino & Dr. Cyndia Trevino (TAVR)      [facility] Electrophysiologist:  None   Referring MD: Haley Trevino, *   1 month s/p TAVR  History of Present Illness:    Haley Trevino is a 81 y.o. female with a hx of scleroderma and Raynaud's (on chronic Revatio therapy), HLD, iron deficiency anemia (managed with intermittent iron infusions), HFpEF and moderate mitral stenosis due to MAC and severe AS s/p TAVR (05/13/22) who presents to clinic for follow up.   She had been followed closely as an outpatient by the Structural Heart Team as echocardiogram in 03/2022 showed EF >75%, mod concentric LVH, and severe AS with a mean grad 48 mmHg, peak grad 76.9 mmHg, AVA 1.01 cm2, DVI 0.36, SVI 78, as well as severe MAC with moderate MS. Underwent L/RHC in 03/2022 which showed minimal obstructive coronary artery disease.    Upon arrival for planned TAVR on 05/13/22 she was found to be hypoxic and anemic on anesthesia induction and case was aborted. Case was aborted and she was admitted. She improved with blood transfusion and IV diuresis. She underwent successful TAVR on 05/20/2022 with a 23 mm Edwards Sapien 3 Ultra Resilia THV via the TF approach. Post operative echo on 05/21/22 showed EF 60% and normally functioning TAVR with a mean gradient 11.5 and trivial AR. She then developed recurrent respiratory failure requiring IV diuresis as well as CHB and underwent placement of an Assurity MRI PPM on 05/27/2022 by Dr. Quentin Trevino. Additionally, she developed new onset aflutter and was started on Haley Trevino. She was seen by PCCM during admission who suspected chronic lung disease 2/2 CREST and recommended outpatient follow  up with HRCT. She was discharged on home oxygen and lasix 59m daily.   She developed melanotic stools and Hg dropped to 6.6. She was transfused 2UPRBCs and Haley Trevino was discontinued and melena cleared.   Today the patient presents to clinic for follow up. Here with her husband. She is doing okay. Just weak and sick of going to the doctor. Doesn't want to see GI right now. She has had an extensive work up in the past that was unrevealing. No CP or SOB. No LE edema, orthopnea or PND. No dizziness or syncope. No blood in stool or urine. No palpitations. Just working on trying to get stronger.     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 TAli Trevino& Haley Trevino   Scleroderma ([facility]    Thyroid disease    hypothyroidism    Past Surgical History:  Procedure Laterality Date   BREAST ENHANCEMENT SURGERY  1975   BREAST IMPLANT REMOVAL Bilateral 04/23/2016   Procedure:  REMOVALBILATERAL BREAST IMPLANTS;  Surgeon: Wallace Going, DO;  Location: [place];  Service: Plastics;  Laterality: Bilateral;   CHOLECYSTECTOMY  1973   ESOPHAGOGASTRODUODENOSCOPY N/A 10/25/2020   Procedure: ESOPHAGOGASTRODUODENOSCOPY (EGD);  Surgeon: Clarene Essex, MD;  Location: [facility];  Service: Endoscopy;  Laterality: N/A;  enteroscopy   HAND SURGERY  08/2012; 11/2012   HOT HEMOSTASIS N/A 10/25/2020   Procedure: HOT HEMOSTASIS (ARGON PLASMA COAGULATION/BICAP);  Surgeon: Clarene Essex, MD;  Location: [facility];  Service: Endoscopy;  Laterality: N/A;   INTRAOPERATIVE TRANSTHORACIC ECHOCARDIOGRAM N/A 05/13/2022   Procedure: INTRAOPERATIVE TRANSTHORACIC ECHOCARDIOGRAM;  Surgeon: Burnell Blanks, MD;  Location: [place] [facility];  Service: Open Heart Surgery;  Laterality: N/A;   INTRAOPERATIVE TRANSTHORACIC ECHOCARDIOGRAM N/A 05/20/2022   Procedure: INTRAOPERATIVE TRANSTHORACIC ECHOCARDIOGRAM;  Surgeon: Early Osmond, MD;  Location: [place];  Service: Open Heart Surgery;  Laterality: N/A;   IR RADIOLOGIST EVAL & MGMT  07/27/2017   IR SACROPLASTY BILATERAL  07/31/2017   IR VERTEBROPLASTY CERV/THOR BX INC UNI/BIL INC/INJECT/IMAGING  03/13/2020   IR VERTEBROPLASTY EA ADDL (T&LS) BX INC UNI/BIL INC INJECT/IMAGING  03/14/2020   MELANOMA EXCISION     at 31 yrs of age   ORIF HIP FRACTURE Right 02/22/2014   Procedure: OPEN REDUCTION INTERNAL FIXATION RIGHT HIP;  Surgeon: Sanjuana Kava, MD;  Location: [facility];  Service: Orthopedics;  Laterality: Right;   PACEMAKER IMPLANT N/A 05/27/2022   Procedure: PACEMAKER IMPLANT;  Surgeon: Vickie Epley, MD;  Location: [place] [facility];  Service: Cardiovascular;  Laterality: N/A;   RIGHT HEART CATH AND CORONARY ANGIOGRAPHY N/A 03/28/2022   Procedure: RIGHT HEART CATH AND CORONARY ANGIOGRAPHY;  Surgeon: Early Osmond, MD;  Location: [place] [facility];  Service: Cardiovascular;  Laterality: N/A;   TOTAL ABDOMINAL HYSTERECTOMY  1974   TRANSCATHETER AORTIC VALVE REPLACEMENT, TRANSFEMORAL Left 05/13/2022   Procedure: Transcatheter Aortic Valve Replacement, Transfemoral;  Surgeon: Burnell Blanks, MD;  Location: [place] [facility];  Service: Open Heart Surgery;  Laterality: Left;   TRANSCATHETER AORTIC VALVE REPLACEMENT, TRANSFEMORAL Left 05/20/2022   Procedure: Transcatheter Aortic Valve Replacement, Transfemoral using an Edwards SAPIEN 3 Ultra 23 MM Heart Valve;  Surgeon: Early Osmond, MD;  Location: [place];  Service: Open Heart Surgery;  Laterality: Left;  Left Transfemoral    Current Medications: Current Meds  Medication Sig   acetaminophen (TYLENOL) 500 MG tablet Take 1,000 mg by mouth at bedtime.   aspirin EC 81 MG tablet  Take 81 mg by mouth daily. Swallow whole.   CALCIUM PO Take 600 mg by mouth daily.   Cholecalciferol (VITAMIN D) 50 MCG (2000 UT) tablet Take 2,000 Units by mouth daily.   clonazePAM (KLONOPIN) 0.5 MG tablet Take 1 tablet (0.5 mg total) by mouth 2 (two) times daily as needed for anxiety.   ezetimibe (ZETIA) 10 MG tablet Take 1 tablet (10 mg total) by mouth daily.   FLUoxetine (PROZAC) 40 MG capsule Take 1 capsule (40 mg total) by mouth daily.   fluticasone (FLONASE) 50 MCG/ACT nasal spray PLACE 2 SPRAYS INTO BOTH NOSTRILS DAILY (Patient taking differently: Place 2 sprays into both nostrils daily as needed for allergies.)   furosemide (LASIX) 20 MG tablet Take 1 tablet (20 mg total) by mouth daily.   Multiple Vitamins-Minerals (PRESERVISION AREDS 2+MULTI VIT PO) Take 1 capsule by mouth in the morning and at bedtime.   NIFEdipine (PROCARDIA XL/NIFEDICAL XL) 60 MG 24 hr tablet Take 1 tablet (60 mg total)  by mouth daily.   omeprazole (PRILOSEC) 20 MG capsule TAKE 1 TABLET DAILY   PROLIA 60 MG/ML SOSY injection Inject 60 mg into the skin every 6 (six) months.   RESTASIS 0.05 % ophthalmic emulsion Place 1 drop into both eyes 2 (two) times daily.   sildenafil (REVATIO) 20 MG tablet Take 1 tablet (20 mg total) by mouth 2 (two) times daily.   SYNTHROID 75 MCG tablet TAKE (1) TABLET DAILY BE- FORE BREAKFAST.     Allergies:   Bactrim [sulfamethoxazole-trimethoprim], Dilaudid [hydromorphone hcl], Dilaudid [hydromorphone], Acyclovir and related, Crestor [rosuvastatin calcium], Gabapentin, Aleve [naproxen sodium], and Lyrica [pregabalin]   Social History   Socioeconomic History   Marital status: Married    Spouse name: Not on file   Number of children: 2   Years of education: Not on file   Highest education level: Some college, no degree  Occupational History   Occupation: retired Occupational psychologist at [employer]  Tobacco Use   Smoking status: Never   Smokeless tobacco: Never   Tobacco comments:     passive tobacco smoke exposure as child  Vaping Use   Vaping Use: Never used  Substance and Sexual Activity   Alcohol use: No   Drug use: No   Sexual activity: Not Currently    Birth control/protection: Surgical  Other Topics Concern   Not on file  Social History Narrative   Regular exercise: walkingCaffeine use: 1 cup of coffee daily   Lives in split level home with her husband   Social Determinants of Health   Financial Resource Strain: [place]  (06/03/2022)   Overall Financial Resource Strain (CARDIA)    Difficulty of Paying Living Expenses: Not hard at all  Food Insecurity: No Food Insecurity (06/02/2022)   Hunger Vital Sign    Worried About Running Out of Food in the Last Year: Never true    [place] in the Last Year: Never true  Transportation Needs: No Transportation Needs (06/03/2022)   PRAPARE - Hydrologist (Medical): No    Lack of Transportation (Non-Medical): No  Physical Activity: Inactive (06/02/2022)   Exercise Vital Sign    Days of Exercise per Week: 0 days    Minutes of Exercise per Session: 0 min  Stress: No Stress Concern Present (06/02/2022)   [place]    Feeling of Stress : Only a little  Social Connections: Moderately Integrated (06/02/2022)   Social Connection and Isolation Panel [NHANES]    Frequency of Communication with Friends and Family: More than three times a week    Frequency of Social Gatherings with Friends and Family: More than three times a week    Attends Religious Services: More than 4 times per year    Active Member of [employer] or Organizations: No    Attends Music therapist: Never    Marital Status: Married     Family History: The patient's family history includes Cancer in her father; Hip fracture in her mother; Mental illness in her mother; Pancreatic cancer in her father; Rashes / Skin problems in her daughter;  Raynaud syndrome in her father.  ROS:   Please see the history of present illness.    All other systems reviewed and are negative.  EKGs/Labs/Other Studies Reviewed:    The following studies were reviewed today:  [facility]  TAVR OPERATIVE NOTE     Date of Procedure:  05/20/2022   Preoperative Diagnosis:      Severe Aortic Stenosis    Postoperative Diagnosis:    Same    Procedure:        Transcatheter Aortic Valve Replacement - Transfemoral Approach             Edwards Sapien 3 Resilia THV (size 78m, model # 9755RLS, serial # [id number])              Co-Surgeons:                         BGaye Pollack MD and ALenna Sciara MD Anesthesiologist:                  WRoderic Palau MD   Echocardiographer:              PJenkins Rouge MD   Pre-operative Echo Findings: Severe aortic stenosis Normal left ventricular systolic function   Post-operative Echo Findings: No paravalvular leak Normal left ventricular systolic function   Left Heart Catheterization Findings: Left ventricular end-diastolic pressure of [id number] __________________    Echo: 05/21/2022 1. Left ventricular ejection fraction, by estimation, is 60 to 65%. The  left ventricle has normal function. The left ventricle has no regional  wall motion abnormalities. There is moderate left ventricular hypertrophy.  Left ventricular diastolic  parameters are indeterminate.   2. Right ventricular systolic function is normal. The right ventricular  size is normal. There is normal pulmonary artery systolic pressure.   3. Left atrial size was mildly dilated.   4. Moderate functional MS due to MAC. The mitral valve is degenerative.  Trivial mitral valve regurgitation. Moderate mitral stenosis. Severe  mitral annular calcification.   5. Tricuspid valve regurgitation is moderate.   6. Post TAVR with 23 mm Sapien 3 valve implant 05/20/22 no PVL mean  gradient 11.5 mm peak 24.8 mm DVI 0.74  AVA 2.5 cm2 Trivial central AR .  The aortic valve has been repaired/replaced. Aortic valve regurgitation is  trivial. No aortic stenosis is  present.   7. The inferior vena cava is normal in size with greater than 50%  respiratory variability, suggesting right atrial pressure of 3 mmHg.    ___________________  Pacemaker Placement: 07/27/2021 CONCLUSIONS:  1. Severe AS s/p TAVR c/b complete heart block 2. Dual chamber permanent pacemaker with left bundle area lead 3. No early apparent complications.   ________________________  Echo 06/23/22 IMPRESSIONS   1. Left ventricular ejection fraction, by estimation, is 60 to 65%. The  left ventricle has normal function. The left ventricle has no regional  wall motion abnormalities. Left ventricular diastolic parameters are  consistent with Grade II diastolic  dysfunction (pseudonormalization). Elevated left ventricular end-diastolic  pressure.   2. Right ventricular systolic function is normal. The right ventricular  size is normal. There is mildly elevated pulmonary artery systolic  pressure.   3. Left atrial size was severely dilated.   4. The mitral valve is degenerative. Trivial mitral valve regurgitation.  Mild to moderate mitral stenosis. The mean mitral valve gradient is 6.0  mmHg at a HR of 66bpm. Severe mitral annular calcification.   5. The aortic valve has been repaired/replaced. There is a 23 mm Ultra,  stented (TAVR) valve present in the aortic position.      Perivalvul Aortic valve regurgitation is trivial. No aortic stenosis  is present. Aortic valve area, by VTI measures 2.85 cm. Aortic valve mean  gradient measures 13.0  mmHg. Aortic valve Vmax measures 2.65 m/s. DVI 0.69   6. The inferior vena cava is normal in size with greater than 50%  respiratory variability, suggesting right atrial pressure of 3 mmHg.   7. Compared to study dated 05/20/2022, the mean TAVR gradient has  incrased from 7 to 45mhg and there is a  trivial perivalvular leak.   ________________________  EKG:  EKG is NOT ordered today.    Recent Labs: 02/13/2022: TSH 1.460 05/12/2022: ALT 12 05/25/2022: B Natriuretic Peptide 1,646.1 05/29/2022: Magnesium 1.7 06/06/2022: BUN 15; Creatinine, Ser 0.73; Potassium 4.2; Sodium 138 06/10/2022: Hemoglobin 6.4; Platelets 287  Recent Lipid Panel    Component Value Date/Time   CHOL 146 02/13/2022 1445   TRIG 144 02/13/2022 1445   TRIG 176 (H) 09/04/2014 1419   HDL 50 02/13/2022 1445   HDL 44 09/04/2014 1419   CHOLHDL 2.9 02/13/2022 1445   LDLCALC 71 02/13/2022 1445   LDLCALC 92 11/29/2013 1718     Risk Assessment/Calculations:    CHA2DS2-VASc Score = 5   This indicates a 7.2% annual risk of stroke. The patient's score is based upon: CHF History: 1 HTN History: 1 Diabetes History: 0 Stroke History: 0 Vascular Disease History: 0 Age Score: 2 Gender Score: 1      Physical Exam:    VS:  BP 116/60   Pulse 63   Ht _0  (1.575 m)   Wt 99 lb 12.8 oz (45.3 kg)   SpO2 99%   BMI 18.25 kg/m     Wt Readings from Last 3 Encounters:  06/20/22 99 lb 12.8 oz (45.3 kg)  06/17/22 98 lb 11.2 oz (44.8 kg)  06/06/22 101 lb (45.8 kg)     GEN: chronically ill appearing, frail HEENT: Normal NECK: No JVD LYMPHATICS: No lymphadenopathy CARDIAC: RRR, soft flow murmur. No rubs, gallops RESPIRATORY:  Clear to auscultation without rales, wheezing or rhonchi  ABDOMEN: Soft, non-tender, non-distended MUSCULOSKELETAL:  No edema; No deformity  SKIN: Warm and dry NEUROLOGIC:  Alert and oriented x 3 PSYCHIATRIC:  Normal affect   ASSESSMENT:    1. S/P TAVR (transcatheter aortic valve replacement)   2. S/P placement of cardiac pacemaker   3. Atrial flutter, unspecified type ([place]   4. Chronic respiratory failure with hypoxia (HCC)   5. Chronic heart failure with preserved ejection fraction (HCC)   6. Iron deficiency anemia due to chronic blood loss   7. Mitral valve stenosis,  unspecified etiology   8. [name] without gangrene     PLAN:    In order of problems listed above:  Severe AS s/p TAVR: echo today shows EF 65%, normally functioning TAVR with a mean gradient of 13 mm hg and trivial PVL as well as mild to mod MS with a mean gradient of 6 mmhg. Haley Trevino discontinued and restarted on a baby aspirin 81 mg daily. She has NYHA class II symptoms. SBE prophylaxis discussed; she wears full dentures. I will see her back for 1 year for follow up and echo.    CHB: s/p PPM placement with an Assurity MRI on 05/27/2022 by Dr. LQuentin Trevino     Atrial Fib/flutter: new onset during most recent admission. Started on Haley Trevino 2.559mBID. Now stopped due to melena and acute blood loss anemia. I think we should keep this off for now. If device shows a large afib burden, we may want to consider re-challenging it or considering LAAO in the future.    Chronic respiratory failure with hypoxia: seen by  pulm during her most recent admission. There was concern for intersitial lung disease/CREST syndrome and it was recommended to pursue HRCT as an outpatient. Continue home 02. Sats have been good on 2L. She has an apt with Dr. Melvyn Novas with pulm in the near future.    Chronic HFpEF: continue Lasix 90m daily. Appears euvolemic.    Chronic Iron Deficiency Anemia: follows closely with GI and hematology as an outpatient with routine iron infusions. Recently had hg down to 6.6 and required 2U PRBC transfusion. She has had an extensive GI work up by Dr. MSarina Serwhich was unrevealing and reluctant to redo all these tests. Her Haley Trevino was discontinued and restarted on a baby aspirin. Heme onc closely following her Hg. This is being followed closely by heme onc   Moderate mitral stenosis with MAC: echo today shows mild to mod MS. If this progresses, she may not be a NOAC candidate.    H/o Raynaud's & scleroderma: Sildenafil continued. May need HRCT as an outpatient for suspected CREST syndrome. Will  defer to pulm    Medication Adjustments/Labs and Tests Ordered: Current medicines are reviewed at length with the patient today.  Concerns regarding medicines are outlined above.  Orders Placed This Encounter  Procedures   ECHOCARDIOGRAM COMPLETE   No orders of the defined types were placed in this encounter.   Patient Instructions  Medication Instructions:  Your physician recommends that you continue on your current medications as directed. Please refer to the Current Medication list given to you today.  *If you need a refill on your cardiac medications before your next appointment, please call your pharmacy*  Lab Work: If you have labs (blood work) drawn today and your tests are completely normal, you will receive your results only by: [place](if you have MyChart) OR A paper copy in the mail If you have any lab test that is abnormal or we need to change your treatment, we will call you to review the results.  Testing/Procedures: Your physician has requested that you have an echocardiogram with 1 year follow-up appointment. Echocardiography is a painless test that uses sound waves to create images of your heart. It provides your doctor with information about the size and shape of your heart and how well your heart's chambers and valves are working. This procedure takes approximately one hour. There are no restrictions for this procedure. Please do NOT wear cologne, perfume, aftershave, or lotions (deodorant is allowed). Please arrive 15 minutes prior to your appointment time.  Follow-Up: [facility] are our [facility].  As part of our continuing mission to provide you with exceptional heart care, we have created designated Provider Care Teams.  These Care Teams include your primary Cardiologist (physician) and Advanced Practice Providers (APPs -  Physician Assistants and Nurse Practitioners) who all work together to provide you with the  care you need, when you need it.  We recommend signing up for the patient portal called "MyChart".  Sign up information is provided on this After Visit Summary.  MyChart is used to connect with patients for Virtual Visits (Telemedicine).  Patients are able to view lab/test results, encounter notes, upcoming appointments, etc.  Non-urgent messages can be sent to your provider as well.   To learn more about what you can do with MyChart, go to [email or web address]    Your next appointment:   1 year(s)  The format for your next appointment:   In Person  Provider:  Nell Range, PA-C    Important Information About Sugar         Signed, Angelena Form, PA-C  06/23/2022 9:04 AM    [facility]

## 2022-06-19 ENCOUNTER — Other Ambulatory Visit: Payer: Self-pay

## 2022-06-19 DIAGNOSIS — D500 Iron deficiency anemia secondary to blood loss (chronic): Secondary | ICD-10-CM

## 2022-06-19 DIAGNOSIS — D649 Anemia, unspecified: Secondary | ICD-10-CM

## 2022-06-19 DIAGNOSIS — K921 Melena: Secondary | ICD-10-CM

## 2022-06-20 ENCOUNTER — Ambulatory Visit (HOSPITAL_COMMUNITY): Payer: Medicare Other | Attending: Cardiology

## 2022-06-20 ENCOUNTER — Inpatient Hospital Stay (INDEPENDENT_AMBULATORY_CARE_PROVIDER_SITE_OTHER): Payer: Medicare Other | Admitting: Physician Assistant

## 2022-06-20 VITALS — BP 116/60 | HR 63 | Ht 62.0 in | Wt 99.8 lb

## 2022-06-20 DIAGNOSIS — I050 Rheumatic mitral stenosis: Secondary | ICD-10-CM

## 2022-06-20 DIAGNOSIS — I5032 Chronic diastolic (congestive) heart failure: Secondary | ICD-10-CM | POA: Diagnosis not present

## 2022-06-20 DIAGNOSIS — I4892 Unspecified atrial flutter: Secondary | ICD-10-CM

## 2022-06-20 DIAGNOSIS — I7300 Raynaud's syndrome without gangrene: Secondary | ICD-10-CM | POA: Diagnosis not present

## 2022-06-20 DIAGNOSIS — Z952 Presence of prosthetic heart valve: Secondary | ICD-10-CM

## 2022-06-20 DIAGNOSIS — Z950 Presence of cardiac pacemaker: Secondary | ICD-10-CM

## 2022-06-20 DIAGNOSIS — J9611 Chronic respiratory failure with hypoxia: Secondary | ICD-10-CM

## 2022-06-20 DIAGNOSIS — D500 Iron deficiency anemia secondary to blood loss (chronic): Secondary | ICD-10-CM

## 2022-06-20 DIAGNOSIS — M349 Systemic sclerosis, unspecified: Secondary | ICD-10-CM | POA: Diagnosis not present

## 2022-06-20 LAB — ECHOCARDIOGRAM COMPLETE
AR max vel: 2.49 cm2
AV Area VTI: 2.85 cm2
AV Area mean vel: 2.59 cm2
AV Mean grad: 13 mmHg
AV Peak grad: 28.1 mmHg
Ao pk vel: 2.65 m/s
Area-P 1/2: 2.54 cm2
S' Lateral: 1.5 cm

## 2022-06-20 NOTE — Patient Instructions (Signed)
Medication Instructions:  Your physician recommends that you continue on your current medications as directed. Please refer to the Current Medication list given to you today.  *If you need a refill on your cardiac medications before your next appointment, please call your pharmacy*  Lab Work: If you have labs (blood work) drawn today and your tests are completely normal, you will receive your results only by: [place] (if you have MyChart) OR A paper copy in the mail If you have any lab test that is abnormal or we need to change your treatment, we will call you to review the results.  Testing/Procedures: Your physician has requested that you have an echocardiogram with 1 year follow-up appointment. Echocardiography is a painless test that uses sound waves to create images of your heart. It provides your doctor with information about the size and shape of your heart and how well your heart's chambers and valves are working. This procedure takes approximately one hour. There are no restrictions for this procedure. Please do NOT wear cologne, perfume, aftershave, or lotions (deodorant is allowed). Please arrive 15 minutes prior to your appointment time.  Follow-Up: At [facility], you and your health needs are our priority.  As part of our continuing mission to provide you with exceptional heart care, we have created designated Provider Care Teams.  These Care Teams include your primary Cardiologist (physician) and Advanced Practice Providers (APPs -  Physician Assistants and Nurse Practitioners) who all work together to provide you with the care you need, when you need it.  We recommend signing up for the patient portal called "MyChart".  Sign up information is provided on this After Visit Summary.  MyChart is used to connect with patients for Virtual Visits (Telemedicine).  Patients are able to view lab/test results, encounter notes, upcoming appointments, etc.  Non-urgent messages  can be sent to your provider as well.   To learn more about what you can do with MyChart, go to [email or web address].    Your next appointment:   1 year(s)  The format for your next appointment:   In Person  Provider:   Nell Range, PA-C    Important Information About Sugar

## 2022-06-26 ENCOUNTER — Inpatient Hospital Stay: Payer: Medicare Other

## 2022-06-26 ENCOUNTER — Other Ambulatory Visit: Payer: Self-pay

## 2022-06-26 DIAGNOSIS — D500 Iron deficiency anemia secondary to blood loss (chronic): Secondary | ICD-10-CM

## 2022-06-26 DIAGNOSIS — K922 Gastrointestinal hemorrhage, unspecified: Secondary | ICD-10-CM | POA: Diagnosis not present

## 2022-06-26 DIAGNOSIS — K921 Melena: Secondary | ICD-10-CM

## 2022-06-26 LAB — CBC
HCT: 35.5 % — ABNORMAL LOW (ref 36.0–46.0)
Hemoglobin: 10.9 g/dL — ABNORMAL LOW (ref 12.0–15.0)
MCH: 28 pg (ref 26.0–34.0)
MCHC: 30.7 g/dL (ref 30.0–36.0)
MCV: 91.3 fL (ref 80.0–100.0)
Platelets: 223 10*3/uL (ref 150–400)
RBC: 3.89 MIL/uL (ref 3.87–5.11)
RDW: 16 % — ABNORMAL HIGH (ref 11.5–15.5)
WBC: 7.4 10*3/uL (ref 4.0–10.5)
nRBC: 0 % (ref 0.0–0.2)

## 2022-06-26 LAB — SAMPLE TO BLOOD BANK

## 2022-06-27 ENCOUNTER — Inpatient Hospital Stay: Payer: Medicare Other

## 2022-06-27 VITALS — BP 111/47 | HR 64 | Temp 98.0°F | Resp 18

## 2022-06-27 DIAGNOSIS — D500 Iron deficiency anemia secondary to blood loss (chronic): Secondary | ICD-10-CM | POA: Diagnosis not present

## 2022-06-27 DIAGNOSIS — K922 Gastrointestinal hemorrhage, unspecified: Secondary | ICD-10-CM | POA: Diagnosis not present

## 2022-06-27 MED ORDER — SODIUM CHLORIDE 0.9 % IV SOLN
300.0000 mg | Freq: Once | INTRAVENOUS | Status: AC
  Administered 2022-06-27: 300 mg via INTRAVENOUS
  Filled 2022-06-27: qty 300

## 2022-06-27 MED ORDER — LORATADINE 10 MG PO TABS
10.0000 mg | ORAL_TABLET | Freq: Once | ORAL | Status: AC
  Administered 2022-06-27: 10 mg via ORAL
  Filled 2022-06-27: qty 1

## 2022-06-27 MED ORDER — ACETAMINOPHEN 325 MG PO TABS
650.0000 mg | ORAL_TABLET | Freq: Once | ORAL | Status: AC
  Administered 2022-06-27: 650 mg via ORAL
  Filled 2022-06-27: qty 2

## 2022-06-27 MED ORDER — SODIUM CHLORIDE 0.9 % IV SOLN: Freq: Once | INTRAVENOUS | Status: AC

## 2022-06-27 NOTE — Progress Notes (Signed)
Patient presents today for Venofer 300 mg IV infusion. MAR reviewed and updated. Patient has no significant changes since her last iron infusion. No complaints today.   Treatment given today per MD orders. Tolerated infusion without adverse affects. Vital signs stable. No complaints at this time. Discharged from clinic by wheel chair accompanied by her husband in stable condition. Alert and oriented x 3. F/U with [facility] as scheduled.

## 2022-06-27 NOTE — Patient Instructions (Signed)
[facility]  Discharge Instructions: Thank you for choosing [facility] to provide your oncology and hematology care.  If you have a lab appointment with the Cancer Center, please come in thru the Main Entrance and check in at the main information desk.  Wear comfortable clothing and clothing appropriate for easy access to any Portacath or PICC line.   We strive to give you quality time with your provider. You may need to reschedule your appointment if you arrive late (15 or more minutes).  Arriving late affects you and other patients whose appointments are after yours.  Also, if you miss three or more appointments without notifying the office, you may be dismissed from the clinic at the provider's discretion.      For prescription refill requests, have your pharmacy contact our office and allow 72 hours for refills to be completed.    Today you received the following chemotherapy and/or immunotherapy agents Venofer 300 mg. Iron Sucrose Injection What is this medication? IRON SUCROSE (EYE ern SOO krose) treats low levels of iron (iron deficiency anemia) in people with kidney disease. Iron is a mineral that plays an important role in making red blood cells, which carry oxygen from your lungs to the rest of your body. This medicine may be used for other purposes; ask your health care provider or pharmacist if you have questions. COMMON BRAND NAME(S): Venofer What should I tell my care team before I take this medication? They need to know if you have any of these conditions: Anemia not caused by low iron levels Heart disease High levels of iron in the blood Kidney disease Liver disease An unusual or allergic reaction to iron, other medications, foods, dyes, or preservatives Pregnant or trying to get pregnant Breastfeeding How should I use this medication? This medication is for infusion into a vein. It is given in a hospital or clinic setting. Talk to your  care team about the use of this medication in children. While this medication may be prescribed for children as young as 2 years for selected conditions, precautions do apply. Overdosage: If you think you have taken too much of this medicine contact a poison control center or emergency room at once. NOTE: This medicine is only for you. Do not share this medicine with others. What if I miss a dose? Keep appointments for follow-up doses. It is important not to miss your dose. Call your care team if you are unable to keep an appointment. What may interact with this medication? Do not take this medication with any of the following: Deferoxamine Dimercaprol Other iron products This medication may also interact with the following: Chloramphenicol Deferasirox This list may not describe all possible interactions. Give your health care provider a list of all the medicines, herbs, non-prescription drugs, or dietary supplements you use. Also tell them if you smoke, drink alcohol, or use illegal drugs. Some items may interact with your medicine. What should I watch for while using this medication? Visit your care team regularly. Tell your care team if your symptoms do not start to get better or if they get worse. You may need blood work done while you are taking this medication. You may need to follow a special diet. Talk to your care team. Foods that contain iron include: whole grains/cereals, dried fruits, beans, or peas, leafy green vegetables, and organ meats (liver, kidney). What side effects may I notice from receiving this medication? Side effects that you should report to   your care team as soon as possible: Allergic reactions--skin rash, itching, hives, swelling of the face, lips, tongue, or throat Low blood pressure--dizziness, feeling faint or lightheaded, blurry vision Shortness of breath Side effects that usually do not require medical attention (report to your care team if they continue or are  bothersome): Flushing Headache Joint pain Muscle pain Nausea Pain, redness, or irritation at injection site This list may not describe all possible side effects. Call your doctor for medical advice about side effects. You may report side effects to FDA at 1-800-FDA-1088. Where should I keep my medication? This medication is given in a hospital or clinic and will not be stored at home. NOTE: This sheet is a summary. It may not cover all possible information. If you have questions about this medicine, talk to your doctor, pharmacist, or health care provider.  2023 Elsevier/Gold Standard (2020-10-04 00:00:00)       To help prevent nausea and vomiting after your treatment, we encourage you to take your nausea medication as directed.  BELOW ARE SYMPTOMS THAT SHOULD BE REPORTED IMMEDIATELY: *FEVER GREATER THAN 100.4 F (38 C) OR HIGHER *CHILLS OR SWEATING *NAUSEA AND VOMITING THAT IS NOT CONTROLLED WITH YOUR NAUSEA MEDICATION *UNUSUAL SHORTNESS OF BREATH *UNUSUAL BRUISING OR BLEEDING *URINARY PROBLEMS (pain or burning when urinating, or frequent urination) *BOWEL PROBLEMS (unusual diarrhea, constipation, pain near the anus) TENDERNESS IN MOUTH AND THROAT WITH OR WITHOUT PRESENCE OF ULCERS (sore throat, sores in mouth, or a toothache) UNUSUAL RASH, SWELLING OR PAIN  UNUSUAL VAGINAL DISCHARGE OR ITCHING   Items with * indicate a potential emergency and should be followed up as soon as possible or go to the Emergency Department if any problems should occur.  Please show the CHEMOTHERAPY ALERT CARD or IMMUNOTHERAPY ALERT CARD at check-in to the Emergency Department and triage nurse.  Should you have questions after your visit or need to cancel or reschedule your appointment, please contact [facility] [phone]  and follow the prompts.  Office hours are 8:00 a.m. to 4:30 p.m. Monday - Friday. Please note that voicemails left after 4:00 p.m. may not be returned until  the following business day.  We are closed weekends and major holidays. You have access to a nurse at all times for urgent questions. Please call the main number to the clinic [phone] and follow the prompts.  For any non-urgent questions, you may also contact your provider using MyChart. We now offer e-Visits for anyone 18 and older to request care online for non-urgent symptoms. For details visit [email or web address].   Also download the MyChart app! Go to the app store, search "MyChart", open the app, select [facility], and log in with your MyChart username and password.  Masks are optional in the cancer centers. If you would like for your care team to wear a mask while they are taking care of you, please let them know. You may have one support person who is at least 80 years old accompany you for your appointments.  

## 2022-07-04 ENCOUNTER — Inpatient Hospital Stay: Payer: Medicare Other

## 2022-07-04 VITALS — BP 141/51 | HR 64 | Temp 98.0°F | Resp 16

## 2022-07-04 DIAGNOSIS — D500 Iron deficiency anemia secondary to blood loss (chronic): Secondary | ICD-10-CM

## 2022-07-04 DIAGNOSIS — K922 Gastrointestinal hemorrhage, unspecified: Secondary | ICD-10-CM | POA: Diagnosis not present

## 2022-07-04 MED ORDER — LORATADINE 10 MG PO TABS
10.0000 mg | ORAL_TABLET | Freq: Once | ORAL | Status: AC
  Administered 2022-07-04: 10 mg via ORAL
  Filled 2022-07-04: qty 1

## 2022-07-04 MED ORDER — SODIUM CHLORIDE 0.9 % IV SOLN
300.0000 mg | Freq: Once | INTRAVENOUS | Status: AC
  Administered 2022-07-04: 300 mg via INTRAVENOUS
  Filled 2022-07-04: qty 300

## 2022-07-04 MED ORDER — SODIUM CHLORIDE 0.9 % IV SOLN: Freq: Once | INTRAVENOUS | Status: AC

## 2022-07-04 MED ORDER — ACETAMINOPHEN 325 MG PO TABS
650.0000 mg | ORAL_TABLET | Freq: Once | ORAL | Status: AC
  Administered 2022-07-04: 650 mg via ORAL
  Filled 2022-07-04: qty 2

## 2022-07-04 NOTE — Patient Instructions (Signed)
[facility]  Discharge Instructions: Thank you for choosing [facility] to provide your oncology and hematology care.  If you have a lab appointment with the Cancer Center, please come in thru the Main Entrance and check in at the main information desk.  Wear comfortable clothing and clothing appropriate for easy access to any Portacath or PICC line.   We strive to give you quality time with your provider. You may need to reschedule your appointment if you arrive late (15 or more minutes).  Arriving late affects you and other patients whose appointments are after yours.  Also, if you miss three or more appointments without notifying the office, you may be dismissed from the clinic at the provider's discretion.      For prescription refill requests, have your pharmacy contact our office and allow 72 hours for refills to be completed.    Today you received the following chemotherapy and/or immunotherapy agents Venofer      To help prevent nausea and vomiting after your treatment, we encourage you to take your nausea medication as directed.  BELOW ARE SYMPTOMS THAT SHOULD BE REPORTED IMMEDIATELY: *FEVER GREATER THAN 100.4 F (38 C) OR HIGHER *CHILLS OR SWEATING *NAUSEA AND VOMITING THAT IS NOT CONTROLLED WITH YOUR NAUSEA MEDICATION *UNUSUAL SHORTNESS OF BREATH *UNUSUAL BRUISING OR BLEEDING *URINARY PROBLEMS (pain or burning when urinating, or frequent urination) *BOWEL PROBLEMS (unusual diarrhea, constipation, pain near the anus) TENDERNESS IN MOUTH AND THROAT WITH OR WITHOUT PRESENCE OF ULCERS (sore throat, sores in mouth, or a toothache) UNUSUAL RASH, SWELLING OR PAIN  UNUSUAL VAGINAL DISCHARGE OR ITCHING   Items with * indicate a potential emergency and should be followed up as soon as possible or go to the Emergency Department if any problems should occur.  Please show the CHEMOTHERAPY ALERT CARD or IMMUNOTHERAPY ALERT CARD at check-in to the Emergency  Department and triage nurse.  Should you have questions after your visit or need to cancel or reschedule your appointment, please contact [facility] [phone]  and follow the prompts.  Office hours are 8:00 a.m. to 4:30 p.m. Monday - Friday. Please note that voicemails left after 4:00 p.m. may not be returned until the following business day.  We are closed weekends and major holidays. You have access to a nurse at all times for urgent questions. Please call the main number to the clinic [phone] and follow the prompts.  For any non-urgent questions, you may also contact your provider using MyChart. We now offer e-Visits for anyone 18 and older to request care online for non-urgent symptoms. For details visit [email or web address].   Also download the MyChart app! Go to the app store, search "MyChart", open the app, select [place] [facility], and log in with your MyChart username and password.   

## 2022-07-04 NOTE — Progress Notes (Signed)
Patient presents today for Venofer infusion per providers order.  Vital signs within parameters for treatment.  Patient has no new complaints at this time.  Peripheral IV started and blood return noted pre and post infusion.  Stable during infusion without adverse affects.  Vital signs stable.  No complaints at this time.  Discharge from clinic via wheelchair in stable condition.  Alert and oriented X 3.  Follow up with [facility] as scheduled.

## 2022-07-09 ENCOUNTER — Other Ambulatory Visit: Payer: Self-pay

## 2022-07-09 DIAGNOSIS — D500 Iron deficiency anemia secondary to blood loss (chronic): Secondary | ICD-10-CM

## 2022-07-10 ENCOUNTER — Inpatient Hospital Stay: Payer: Medicare Other | Attending: Hematology

## 2022-07-10 DIAGNOSIS — D509 Iron deficiency anemia, unspecified: Secondary | ICD-10-CM | POA: Insufficient documentation

## 2022-07-10 DIAGNOSIS — D500 Iron deficiency anemia secondary to blood loss (chronic): Secondary | ICD-10-CM

## 2022-07-10 LAB — SAMPLE TO BLOOD BANK

## 2022-07-10 LAB — CBC
HCT: 31.6 % — ABNORMAL LOW (ref 36.0–46.0)
Hemoglobin: 11.5 g/dL — ABNORMAL LOW (ref 12.0–15.0)
MCH: 35.2 pg — ABNORMAL HIGH (ref 26.0–34.0)
MCHC: 36.4 g/dL — ABNORMAL HIGH (ref 30.0–36.0)
MCV: 96.6 fL (ref 80.0–100.0)
Platelets: 214 10*3/uL (ref 150–400)
RBC: 3.27 MIL/uL — ABNORMAL LOW (ref 3.87–5.11)
RDW: 17.8 % — ABNORMAL HIGH (ref 11.5–15.5)
WBC: 6.2 10*3/uL (ref 4.0–10.5)
nRBC: 0 % (ref 0.0–0.2)

## 2022-07-11 ENCOUNTER — Inpatient Hospital Stay: Payer: Medicare Other

## 2022-07-22 ENCOUNTER — Ambulatory Visit (HOSPITAL_COMMUNITY)
Admission: RE | Admit: 2022-07-22 | Discharge: 2022-07-22 | Disposition: A | Discharge status: Home / Self Care | Payer: Medicare Other | Source: Ambulatory Visit | Attending: Internal Medicine | Admitting: Internal Medicine

## 2022-07-22 ENCOUNTER — Ambulatory Visit (INDEPENDENT_AMBULATORY_CARE_PROVIDER_SITE_OTHER): Payer: Medicare Other | Admitting: Internal Medicine

## 2022-07-22 ENCOUNTER — Encounter: Payer: Self-pay | Admitting: Internal Medicine

## 2022-07-22 VITALS — BP 130/82 | HR 78 | Temp 97.8°F | Ht 62.0 in | Wt 102.6 lb

## 2022-07-22 DIAGNOSIS — J9601 Acute respiratory failure with hypoxia: Secondary | ICD-10-CM | POA: Diagnosis not present

## 2022-07-22 DIAGNOSIS — R0609 Other forms of dyspnea: Secondary | ICD-10-CM

## 2022-07-22 DIAGNOSIS — R0600 Dyspnea, unspecified: Secondary | ICD-10-CM | POA: Diagnosis not present

## 2022-07-22 DIAGNOSIS — J90 Pleural effusion, not elsewhere classified: Secondary | ICD-10-CM | POA: Diagnosis not present

## 2022-07-22 NOTE — Assessment & Plan Note (Signed)
D/c on 02 05/29/22 p TAVR  - no sesats walking 07/22/2022 > rec 2llpm hs and prn daytime   Advised: Make sure you check your oxygen saturation  AT  your highest level of activity (not after you stop)   to be sure it stays over 90% and adjust  02 flow upward to maintain this level if needed but remember to turn it back to previous settings when you stop (to conserve your supply).   F/u in 3 m with pfts  Each maintenance medication was reviewed in detail including emphasizing most importantly the difference between maintenance and prns and under what circumstances the prns are to be triggered using an action plan format where appropriate.  Total time for H and P, chart review, counseling,  directly observing portions of ambulatory 02 saturation study/ and generating customized AVS unique to this office visit / same day charting > 60 min new pt evaluation for chronic complex cardioresp problems

## 2022-07-22 NOTE — Assessment & Plan Note (Signed)
S/p TAVR for severe AS 05/2022 > much improved despite setting longstanding raynaud's on sildenafil  - Echo 06/20/22  1. LV EF 60 to 65%.   Left ventricular diastolic parameters are  consistent with Grade II  diastolic dysfunction  Elevated  LVEP  2. Right ventricular systolic function is normal. The right ventricular  size is normal. There is mildly elevated PAS   3. Left atrial size was severely dilated.   4. The mitral valve is degenerative. Trivial mitral valve regurgitation.  Mild to moderate mitral stenosis. The mean mitral valve gradient is 6.0  - 07/22/2022   Walked on RA  x  3  lap(s) =  approx 450  ft  @ mod/fast pace, stopped due to end of study  with lowest 02 sats 94%  mild sob on last lap   At this point there is no evidence of significant ILD but since has Raynaud's will get baseline full pfts and asked Pt: Make sure you check your oxygen saturation at your highest level of activity(NOT after you stop)  to be sure it stays over 90% and keep track of it at least once a week, more often if breathing getting worse, and let me know if losing ground. (Collect the dots to connect the dots approach)

## 2022-07-22 NOTE — Patient Instructions (Addendum)
Make sure you check your oxygen saturation  AT  your highest level of activity (not after you stop)   to be sure it stays over 90% and adjust  02 flow upward to maintain this level if needed but remember to turn it back to previous settings when you stop (to conserve your supply).   Make sure you check your oxygen saturation at your highest level of activity(NOT after you stop)  to be sure it stays over 90% and keep track of it at least once a week, more often if breathing getting worse, and let me know if losing ground. (Collect the dots to connect the dots approach)    Please remember to go to the  x-ray department  @  [facility] for your tests - we will call you with the results when they are available      Please schedule a follow up visit in 3 months but call sooner if needed with pfts on return

## 2022-07-22 NOTE — Progress Notes (Addendum)
Haley Trevino, female    DOB: [birth year]    MRN: [id number]   Brief patient profile:  65  yowf   never smoker  referred to pulmonary clinic in [place]  07/22/2022 by Angelena Form  for doe ? ILD    [place]nards in [place] previously better on sulindafil   Admit date: 05/13/2022 Discharge date: 05/29/2022   Primary Care Provider: [name], FNP  Primary Cardiologist:  [name], MD Structural Heart: Dr. Ali Lowe & Dr. Cyndia Bent (TAVR)       Discharge Diagnoses    Principal Problem:   S/P TAVR (transcatheter aortic valve replacement)   [name]   Hypertension   Hyperlipidemia with target LDL less than 100   Nonrheumatic aortic valve stenosis   Acute on chronic diastolic heart failure (HCC)   Acute respiratory distress   Anxiety   [name]   Hyperkalemia   Heart block AV complete ([place])    History of Present Illness  07/22/2022  Pulmonary/ 1st office eval/ Noah Pelaez / [place] [facility]  Chief Complaint  Patient presents with   Consult    Was told something may be wrong with her lungs  Dyspnea:  x  steps despite 02/  no ex program  Cough: minimal / occ choking since in hospital  Sleep: flat bed one pillow under head  SABA use:  02: 2.5  lpm  CP Ant left side  24/7  x years skin is sensitive w/in a year of L  breast surgery  No obvious day to day or daytime pattern/variability or assoc excess/ purulent sputum or mucus plugs or hemoptysis or   chest tightness, subjective wheeze or overt sinus or hb symptoms.   Sleeping  without nocturnal  or early am exacerbation  of respiratory  c/o's or need for noct saba. Also denies any obvious fluctuation of symptoms with weather or environmental changes or other aggravating or alleviating factors except as outlined above   No unusual exposure hx or h/o childhood pna/ asthma or knowledge of premature birth.  Current Allergies, Complete Past Medical History, Past Surgical History,  Family History, and Social History were reviewed in [employer] record.  ROS  The following are not active complaints unless bolded Hoarseness, sore throat, dysphagia/globus sensation , dental problems, itching, sneezing,  nasal congestion or discharge of excess mucus or purulent secretions, ear ache,   fever, chills, sweats, unintended wt loss or wt gain, classically pleuritic or exertional cp,  orthopnea pnd or arm/hand swelling  or leg swelling, presyncope, palpitations, abdominal pain, anorexia, nausea, vomiting, diarrhea  or change in bowel habits or change in bladder habits, change in stools or change in urine, dysuria, hematuria,  rash, arthralgias, visual complaints, headache, numbness, weakness or ataxia or problems with walking or coordination,  change in mood or  memory.          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 Ali Lowe & Bartle   Scleroderma [facility])    Thyroid disease    hypothyroidism    Outpatient Medications Prior to Visit  Medication Sig Dispense Refill  acetaminophen (TYLENOL) 500 MG tablet Take 1,000 mg by mouth at bedtime.     aspirin EC 81 MG tablet Take 81 mg by mouth daily. Swallow whole.     CALCIUM PO Take 600 mg by mouth daily.     Cholecalciferol (VITAMIN D) 50 MCG (2000 UT) tablet Take 2,000 Units by mouth daily.     clonazePAM (KLONOPIN) 0.5 MG tablet Take 1 tablet (0.5 mg total) by mouth 2 (two) times daily as needed for anxiety. 180 tablet 1   ezetimibe (ZETIA) 10 MG tablet Take 1 tablet (10 mg total) by mouth daily. 90 tablet 1   FLUoxetine (PROZAC) 40 MG capsule Take 1 capsule (40 mg total) by mouth daily. 90  capsule 1   fluticasone (FLONASE) 50 MCG/ACT nasal spray PLACE 2 SPRAYS INTO BOTH NOSTRILS DAILY (Patient taking differently: Place 2 sprays into both nostrils daily as needed for allergies.) 16 g 6   furosemide (LASIX) 20 MG tablet Take 1 tablet (20 mg total) by mouth daily. 90 tablet 3   Multiple Vitamins-Minerals (PRESERVISION AREDS 2+MULTI VIT PO) Take 1 capsule by mouth in the morning and at bedtime.     NIFEdipine (PROCARDIA XL/NIFEDICAL XL) 60 MG 24 hr tablet Take 1 tablet (60 mg total) by mouth daily. 90 tablet 1   omeprazole (PRILOSEC) 20 MG capsule TAKE 1 TABLET DAILY 90 capsule 1   PROLIA 60 MG/ML SOSY injection Inject 60 mg into the skin every 6 (six) months. 180 mL 0   RESTASIS 0.05 % ophthalmic emulsion Place 1 drop into both eyes 2 (two) times daily.     sildenafil (REVATIO) 20 MG tablet Take 1 tablet (20 mg total) by mouth 2 (two) times daily. 180 tablet 1   SYNTHROID 75 MCG tablet TAKE (1) TABLET DAILY BE- FORE BREAKFAST. 90 tablet 1   No facility-administered medications prior to visit.     Objective:     BP 130/82   Pulse 78   Temp 97.8 F (36.6 C)   Ht '5\' 2"'$  (1.575 m)   Wt 102 lb 9.6 oz (46.5 kg)   SpO2 97% Comment: 2.5 LO2 cont  BMI 18.77 kg/m   SpO2: 97 % (2.5 LO2 cont)  Pleasant elderly wf nad    HEENT : Oropharynx  clear         NECK :  without  apparent JVD/ palpable Nodes/TM    LUNGS: no acc muscle use,  Nl contour chest which is clear to A and P bilaterally without cough on insp or exp maneuvers   CV:  RRR  no s3 or 2/6 SEM s  increase in P2, and no edema   ABD:  soft and nontender with nl inspiratory excursion in the supine position. No bruits or organomegaly appreciated   MS:  Nl gait/ ext warm with sclerodactly  but no  calf tenderness, cyanosis or clubbing    SKIN: warm and dry without lesions    NEURO:  alert, approp, nl sensorium with  no motor or cerebellar deficits apparent.   CXR PA and Lateral:   07/22/2022 :    I personally  reviewed images and impression is as follows:     Mod CM/ T kyphosis, no convincing ILD     Assessment   DOE (dyspnea on exertion) S/p TAVR for severe AS 05/2022 > much improved despite setting longstanding raynaud's on sildenafil  - Echo 06/20/22  1. LV EF 60 to 65%.   Left ventricular diastolic parameters are  consistent  with Grade II  diastolic dysfunction  Elevated  LVEP  2. Right ventricular systolic function is normal. The right ventricular  size is normal. There is mildly elevated PAS   3. Left atrial size was severely dilated.   4. The mitral valve is degenerative. Trivial mitral valve regurgitation.  Mild to moderate mitral stenosis. The mean mitral valve gradient is 6.0  - 07/22/2022   Walked on RA  x  3  lap(s) =  approx 450  ft  @ mod/fast pace, stopped due to end of study  with lowest 02 sats 94%  mild sob on last lap   At this point there is no evidence of significant ILD but since has Raynaud's will get baseline full pfts and asked Pt: Make sure you check your oxygen saturation at your highest level of activity(NOT after you stop)  to be sure it stays over 90% and keep track of it at least once a week, more often if breathing getting worse, and let me know if losing ground. (Collect the dots to connect the dots approach)      Acute respiratory failure with hypoxemia (HCC) D/c on 02 05/29/22 p TAVR  - no sesats walking 07/22/2022 > rec 2llpm hs and prn daytime   Advised: Make sure you check your oxygen saturation  AT  your highest level of activity (not after you stop)   to be sure it stays over 90% and adjust  02 flow upward to maintain this level if needed but remember to turn it back to previous settings when you stop (to conserve your supply).   F/u in 3 m with pfts  Each maintenance medication was reviewed in detail including emphasizing most importantly the difference between maintenance and prns and under what circumstances the prns are to be triggered using an action  plan format where appropriate.  Total time for H and P, chart review, counseling,  directly observing portions of ambulatory 02 saturation study/ and generating customized AVS unique to this office visit / same day charting > 60 min new pt evaluation for chronic complex cardioresp problems                    Christinia Gully, MD 07/22/2022

## 2022-07-24 ENCOUNTER — Inpatient Hospital Stay: Payer: Medicare Other

## 2022-07-24 ENCOUNTER — Other Ambulatory Visit: Payer: Self-pay

## 2022-07-24 VITALS — BP 106/42 | HR 70 | Temp 97.7°F | Resp 16

## 2022-07-24 DIAGNOSIS — D500 Iron deficiency anemia secondary to blood loss (chronic): Secondary | ICD-10-CM

## 2022-07-24 DIAGNOSIS — D509 Iron deficiency anemia, unspecified: Secondary | ICD-10-CM | POA: Diagnosis not present

## 2022-07-24 DIAGNOSIS — D649 Anemia, unspecified: Secondary | ICD-10-CM

## 2022-07-24 LAB — CBC
HCT: 34.1 % — ABNORMAL LOW (ref 36.0–46.0)
Hemoglobin: 11.2 g/dL — ABNORMAL LOW (ref 12.0–15.0)
MCH: 31.6 pg (ref 26.0–34.0)
MCHC: 32.8 g/dL (ref 30.0–36.0)
MCV: 96.3 fL (ref 80.0–100.0)
Platelets: 239 10*3/uL (ref 150–400)
RBC: 3.54 MIL/uL — ABNORMAL LOW (ref 3.87–5.11)
RDW: 17.7 % — ABNORMAL HIGH (ref 11.5–15.5)
WBC: 7.6 10*3/uL (ref 4.0–10.5)
nRBC: 0 % (ref 0.0–0.2)

## 2022-07-24 LAB — TYPE AND SCREEN
ABO/RH(D): O POS
Antibody Screen: NEGATIVE

## 2022-07-24 MED ORDER — SODIUM CHLORIDE 0.9 % IV SOLN: Freq: Once | INTRAVENOUS | Status: AC

## 2022-07-24 MED ORDER — LORATADINE 10 MG PO TABS
10.0000 mg | ORAL_TABLET | Freq: Once | ORAL | Status: AC
  Administered 2022-07-24: 10 mg via ORAL
  Filled 2022-07-24: qty 1

## 2022-07-24 MED ORDER — SODIUM CHLORIDE 0.9 % IV SOLN
300.0000 mg | Freq: Once | INTRAVENOUS | Status: AC
  Administered 2022-07-24: 300 mg via INTRAVENOUS
  Filled 2022-07-24: qty 300

## 2022-07-24 MED ORDER — ACETAMINOPHEN 325 MG PO TABS
650.0000 mg | ORAL_TABLET | Freq: Once | ORAL | Status: AC
  Administered 2022-07-24: 650 mg via ORAL
  Filled 2022-07-24: qty 2

## 2022-07-24 NOTE — Patient Instructions (Signed)
[facility]  Discharge Instructions: Thank you for choosing [facility] to provide your oncology and hematology care.  If you have a lab appointment with the Cancer Center, please come in thru the Main Entrance and check in at the main information desk.  Wear comfortable clothing and clothing appropriate for easy access to any Portacath or PICC line.   We strive to give you quality time with your provider. You may need to reschedule your appointment if you arrive late (15 or more minutes).  Arriving late affects you and other patients whose appointments are after yours.  Also, if you miss three or more appointments without notifying the office, you may be dismissed from the clinic at the provider's discretion.      For prescription refill requests, have your pharmacy contact our office and allow 72 hours for refills to be completed.    Iron Sucrose Injection What is this medication? IRON SUCROSE (EYE ern SOO krose) treats low levels of iron (iron deficiency anemia) in people with kidney disease. Iron is a mineral that plays an important role in making red blood cells, which carry oxygen from your lungs to the rest of your body. This medicine may be used for other purposes; ask your health care provider or pharmacist if you have questions. COMMON BRAND NAME(S): Venofer What should I tell my care team before I take this medication? They need to know if you have any of these conditions: Anemia not caused by low iron levels Heart disease High levels of iron in the blood Kidney disease Liver disease An unusual or allergic reaction to iron, other medications, foods, dyes, or preservatives Pregnant or trying to get pregnant Breastfeeding How should I use this medication? This medication is for infusion into a vein. It is given in a hospital or clinic setting. Talk to your care team about the use of this medication in children. While this medication may be  prescribed for children as young as 2 years for selected conditions, precautions do apply. Overdosage: If you think you have taken too much of this medicine contact a poison control center or emergency room at once. NOTE: This medicine is only for you. Do not share this medicine with others. What if I miss a dose? Keep appointments for follow-up doses. It is important not to miss your dose. Call your care team if you are unable to keep an appointment. What may interact with this medication? Do not take this medication with any of the following: Deferoxamine Dimercaprol Other iron products This medication may also interact with the following: Chloramphenicol Deferasirox This list may not describe all possible interactions. Give your health care provider a list of all the medicines, herbs, non-prescription drugs, or dietary supplements you use. Also tell them if you smoke, drink alcohol, or use illegal drugs. Some items may interact with your medicine. What should I watch for while using this medication? Visit your care team regularly. Tell your care team if your symptoms do not start to get better or if they get worse. You may need blood work done while you are taking this medication. You may need to follow a special diet. Talk to your care team. Foods that contain iron include: whole grains/cereals, dried fruits, beans, or peas, leafy green vegetables, and organ meats (liver, kidney). What side effects may I notice from receiving this medication? Side effects that you should report to your care team as soon as possible: Allergic reactions--skin rash, itching, hives,   swelling of the face, lips, tongue, or throat Low blood pressure--dizziness, feeling faint or lightheaded, blurry vision Shortness of breath Side effects that usually do not require medical attention (report to your care team if they continue or are bothersome): Flushing Headache Joint pain Muscle pain Nausea Pain, redness, or  irritation at injection site This list may not describe all possible side effects. Call your doctor for medical advice about side effects. You may report side effects to FDA at 1-800-FDA-1088. Where should I keep my medication? This medication is given in a hospital or clinic and will not be stored at home. NOTE: This sheet is a summary. It may not cover all possible information. If you have questions about this medicine, talk to your doctor, pharmacist, or health care provider.  2023 Elsevier/Gold Standard (2020-10-04 00:00:00)    To help prevent nausea and vomiting after your treatment, we encourage you to take your nausea medication as directed.  BELOW ARE SYMPTOMS THAT SHOULD BE REPORTED IMMEDIATELY: *FEVER GREATER THAN 100.4 F (38 C) OR HIGHER *CHILLS OR SWEATING *NAUSEA AND VOMITING THAT IS NOT CONTROLLED WITH YOUR NAUSEA MEDICATION *UNUSUAL SHORTNESS OF BREATH *UNUSUAL BRUISING OR BLEEDING *URINARY PROBLEMS (pain or burning when urinating, or frequent urination) *BOWEL PROBLEMS (unusual diarrhea, constipation, pain near the anus) TENDERNESS IN MOUTH AND THROAT WITH OR WITHOUT PRESENCE OF ULCERS (sore throat, sores in mouth, or a toothache) UNUSUAL RASH, SWELLING OR PAIN  UNUSUAL VAGINAL DISCHARGE OR ITCHING   Items with * indicate a potential emergency and should be followed up as soon as possible or go to the Emergency Department if any problems should occur.  Please show the CHEMOTHERAPY ALERT CARD or IMMUNOTHERAPY ALERT CARD at check-in to the Emergency Department and triage nurse.  Should you have questions after your visit or need to cancel or reschedule your appointment, please contact [facility] [phone]  and follow the prompts.  Office hours are 8:00 a.m. to 4:30 p.m. Monday - Friday. Please note that voicemails left after 4:00 p.m. may not be returned until the following business day.  We are closed weekends and major holidays. You have access to  a nurse at all times for urgent questions. Please call the main number to the clinic [phone] and follow the prompts.  For any non-urgent questions, you may also contact your provider using MyChart. We now offer e-Visits for anyone 18 and older to request care online for non-urgent symptoms. For details visit [email or web address].   Also download the MyChart app! Go to the app store, search "MyChart", open the app, select [facility], and log in with your MyChart username and password.   

## 2022-07-24 NOTE — Progress Notes (Signed)
Patient presents today for iron infusion.  Patient is in satisfactory condition with no new complaints voiced.  Vital signs are stable.  We will proceed with treatment per provider orders.   Patient tolerated treatment well with no complaints voiced.  Patient left via wheelchair with husband in stable condition.  Vital signs stable at discharge.  Follow up as scheduled.

## 2022-07-25 ENCOUNTER — Inpatient Hospital Stay: Payer: Medicare Other

## 2022-07-31 ENCOUNTER — Telehealth: Payer: Self-pay

## 2022-07-31 ENCOUNTER — Inpatient Hospital Stay: Payer: Medicare Other | Attending: Hematology

## 2022-07-31 DIAGNOSIS — I442 Atrioventricular block, complete: Secondary | ICD-10-CM | POA: Insufficient documentation

## 2022-07-31 DIAGNOSIS — D500 Iron deficiency anemia secondary to blood loss (chronic): Secondary | ICD-10-CM | POA: Insufficient documentation

## 2022-07-31 DIAGNOSIS — Z950 Presence of cardiac pacemaker: Secondary | ICD-10-CM | POA: Insufficient documentation

## 2022-07-31 DIAGNOSIS — I4892 Unspecified atrial flutter: Secondary | ICD-10-CM | POA: Insufficient documentation

## 2022-07-31 DIAGNOSIS — R5383 Other fatigue: Secondary | ICD-10-CM | POA: Insufficient documentation

## 2022-07-31 DIAGNOSIS — Z952 Presence of prosthetic heart valve: Secondary | ICD-10-CM | POA: Insufficient documentation

## 2022-07-31 DIAGNOSIS — R0602 Shortness of breath: Secondary | ICD-10-CM | POA: Insufficient documentation

## 2022-07-31 DIAGNOSIS — I7300 Raynaud's syndrome without gangrene: Secondary | ICD-10-CM | POA: Insufficient documentation

## 2022-07-31 DIAGNOSIS — I5032 Chronic diastolic (congestive) heart failure: Secondary | ICD-10-CM | POA: Insufficient documentation

## 2022-07-31 DIAGNOSIS — J9611 Chronic respiratory failure with hypoxia: Secondary | ICD-10-CM | POA: Insufficient documentation

## 2022-07-31 DIAGNOSIS — I050 Rheumatic mitral stenosis: Secondary | ICD-10-CM | POA: Insufficient documentation

## 2022-07-31 LAB — CUP PACEART REMOTE DEVICE CHECK
Battery Remaining Longevity: 130 mo
Battery Remaining Percentage: 95.5 %
Battery Voltage: 3.02 V
Brady Statistic AP VP Percent: 1 %
Brady Statistic AP VS Percent: 9.6 %
Brady Statistic AS VP Percent: 1 %
Brady Statistic AS VS Percent: 90 %
Brady Statistic RA Percent Paced: 9.2 %
Brady Statistic RV Percent Paced: 1 %
Date Time Interrogation Session: 20240125150951
Implantable Lead Connection Status: 753985
Implantable Lead Connection Status: 753985
Implantable Lead Implant Date: 20231121
Implantable Lead Implant Date: 20231121
Implantable Lead Location: 753859
Implantable Lead Location: 753860
Implantable Pulse Generator Implant Date: 20231121
Lead Channel Impedance Value: 390 Ohm
Lead Channel Impedance Value: 410 Ohm
Lead Channel Pacing Threshold Amplitude: 0.75 V
Lead Channel Pacing Threshold Amplitude: 1 V
Lead Channel Pacing Threshold Pulse Width: 0.4 ms
Lead Channel Pacing Threshold Pulse Width: 0.5 ms
Lead Channel Sensing Intrinsic Amplitude: 1.7 mV
Lead Channel Sensing Intrinsic Amplitude: 6 mV
Lead Channel Setting Pacing Amplitude: 1.25 V
Lead Channel Setting Pacing Amplitude: 1.75 V
Lead Channel Setting Pacing Pulse Width: 0.5 ms
Lead Channel Setting Sensing Sensitivity: 2 mV
Pulse Gen Model: 2272
Pulse Gen Serial Number: 6860174

## 2022-07-31 NOTE — Telephone Encounter (Signed)
Received request by Angelena Form, PA-C to review patient's most recent transmission and eval whether she has had any recent Afib/flutter episodes.   Per Transmission: Device function is normal. Presenting is NSR 85bpm. There are 3 mode switches noted/ MS burden <1% Only mode switch episode recorded was an A Tach with avg bpm of 155, max 172. Lasting 6 seconds.  (Jan 23rd, 2:45pm).   Notified K. Grandville Silos, PA-C.  She will be following up with patient tomorrow.                EPISODE: JANUARY 23RD, 2:43PM

## 2022-07-31 NOTE — Telephone Encounter (Signed)
I got a manual transmission with the patient monitor. Transmission received. I had Mandy review it.

## 2022-07-31 NOTE — Progress Notes (Signed)
HEART AND [place]                                     Cardiology Office Note:    Date:  08/01/2022   ID:  KALLE BERNATH, DOB 05-20-1941, MRN [id number]  PCP:  [name], [facility] Cardiologist:  [name], MD / Dr. Ali Lowe & Dr. Cyndia Bent (TAVR)      [facility] Electrophysiologist:  None   Referring MD: Hassell Done, Mary-Margaret, *   Add on to clinic for evaluation of elevated HRs   History of Present Illness:    Haley Trevino is a 82 y.o. female with a hx of scleroderma and Raynaud's (on chronic Revatio therapy), HLD, iron deficiency anemia (managed with intermittent iron infusions), HFpEF and moderate mitral stenosis due to MAC and severe AS s/p TAVR (05/13/22) who presents to clinic for follow up.    She had been followed closely as an outpatient by the Structural Heart Team as echocardiogram in 03/2022 showed EF >75%, mod concentric LVH, and severe AS with a mean grad 48 mmHg, peak grad 76.9 mmHg, AVA 1.01 cm2, DVI 0.36, SVI 78, as well as severe MAC with moderate MS. Underwent L/RHC in 03/2022 which showed minimal obstructive coronary artery disease.    Upon arrival for planned TAVR on 05/13/22 she was found to be hypoxic and anemic on anesthesia induction and case was aborted. Case was aborted and she was admitted. She improved with blood transfusion and IV diuresis. She underwent successful TAVR on 05/20/2022 with a 23 mm Edwards Sapien 3 Ultra Resilia THV via the TF approach. Post operative echo on 05/21/22 showed EF 60% and normally functioning TAVR with a mean gradient 11.5 and trivial AR. She then developed recurrent respiratory failure requiring IV diuresis as well as CHB and underwent placement of an Assurity MRI PPM on 05/27/2022 by Dr. Quentin Ore. Additionally, she developed new onset aflutter and was started on Eliquis. She was seen by PCCM during admission who suspected chronic lung disease 2/2 CREST  and recommended outpatient follow up with HRCT. She was discharged on home oxygen and lasix 29m daily.    She developed melanotic stools and Hg dropped to 6.6. She was transfused 2UPRBCs and Eliquis was discontinued and melena cleared. Started on a baby aspirin. Recent CBC showed Hg 11.2. She is closely followed by heme-onc and gets frequent iron infusions. 1 month echo 06/20/22 showed EF 65%, normally functioning TAVR with a mean gradient of 13 mm hg and trivial PVL as well as mild to mod MS with a mean gradient of 6 mmhg.    She recently saw Dr. WMelvyn Novaswith PCCM who did not think she had ILD but ordered PFTs and discontinued 02 during the day. CXR with right trace pleural effusion  She called into our office to report worsening fatigue. She also had elevated HRs during the day. She presents to clinic today for evaluation. Here alone. Has had a lot of stress with her husbands dementia. Has has worsening fatigue and sob since coming off daytime 02. Walks 6 steps and huffing and puffing. No LE edema, orthopnea or PND. No dizziness or syncope. No blood in stool or urine. No palpitations. Has pain all over, in arms, chest, back. Told it was peripheral neuropathy.   Past Medical History:  Diagnosis Date   Cataract    Depression  GERD (gastroesophageal reflux disease)    Hyperlipidemia    Hypertension    Hypothyroidism    IBS (irritable bowel syndrome)    Osteoarthritis    Raynaud disease    S/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 Ali Lowe & Bartle   Scleroderma [facility])    Thyroid disease    hypothyroidism    Past Surgical History:  Procedure Laterality Date   BREAST ENHANCEMENT SURGERY  1975   BREAST IMPLANT REMOVAL Bilateral 04/23/2016   Procedure: REMOVALBILATERAL BREAST IMPLANTS;  Surgeon: Wallace Going, DO;  Location: [place];  Service: Plastics;  Laterality: Bilateral;    CHOLECYSTECTOMY  1973   ESOPHAGOGASTRODUODENOSCOPY N/A 10/25/2020   Procedure: ESOPHAGOGASTRODUODENOSCOPY (EGD);  Surgeon: Clarene Essex, MD;  Location: [facility];  Service: Endoscopy;  Laterality: N/A;  enteroscopy   HAND SURGERY  08/2012; 11/2012   HOT HEMOSTASIS N/A 10/25/2020   Procedure: HOT HEMOSTASIS (ARGON PLASMA COAGULATION/BICAP);  Surgeon: Clarene Essex, MD;  Location: [facility];  Service: Endoscopy;  Laterality: N/A;   INTRAOPERATIVE TRANSTHORACIC ECHOCARDIOGRAM N/A 05/13/2022   Procedure: INTRAOPERATIVE TRANSTHORACIC ECHOCARDIOGRAM;  Surgeon: Burnell Blanks, MD;  Location: [place] [facility];  Service: Open Heart Surgery;  Laterality: N/A;   INTRAOPERATIVE TRANSTHORACIC ECHOCARDIOGRAM N/A 05/20/2022   Procedure: INTRAOPERATIVE TRANSTHORACIC ECHOCARDIOGRAM;  Surgeon: Early Osmond, MD;  Location: [place];  Service: Open Heart Surgery;  Laterality: N/A;   IR RADIOLOGIST EVAL & MGMT  07/27/2017   IR SACROPLASTY BILATERAL  07/31/2017   IR VERTEBROPLASTY CERV/THOR BX INC UNI/BIL INC/INJECT/IMAGING  03/13/2020   IR VERTEBROPLASTY EA ADDL (T&LS) BX INC UNI/BIL INC INJECT/IMAGING  03/14/2020   MELANOMA EXCISION     at 31 yrs of age   ORIF HIP FRACTURE Right 02/22/2014   Procedure: OPEN REDUCTION INTERNAL FIXATION RIGHT HIP;  Surgeon: Sanjuana Kava, MD;  Location: [facility];  Service: Orthopedics;  Laterality: Right;   PACEMAKER IMPLANT N/A 05/27/2022   Procedure: PACEMAKER IMPLANT;  Surgeon: Vickie Epley, MD;  Location: [place] [facility];  Service: Cardiovascular;  Laterality: N/A;   RIGHT HEART CATH AND CORONARY ANGIOGRAPHY N/A 03/28/2022   Procedure: RIGHT HEART CATH AND CORONARY ANGIOGRAPHY;  Surgeon: Early Osmond, MD;  Location: [place] [facility];  Service: Cardiovascular;  Laterality: N/A;   TOTAL ABDOMINAL HYSTERECTOMY  1974   TRANSCATHETER AORTIC VALVE REPLACEMENT, TRANSFEMORAL Left 05/13/2022   Procedure: Transcatheter Aortic Valve Replacement, Transfemoral;  Surgeon:  Burnell Blanks, MD;  Location: [place] [facility];  Service: Open Heart Surgery;  Laterality: Left;   TRANSCATHETER AORTIC VALVE REPLACEMENT, TRANSFEMORAL Left 05/20/2022   Procedure: Transcatheter Aortic Valve Replacement, Transfemoral using an Edwards SAPIEN 3 Ultra 23 MM Heart Valve;  Surgeon: Early Osmond, MD;  Location: [place];  Service: Open Heart Surgery;  Laterality: Left;  Left Transfemoral    Current Medications: Current Meds  Medication Sig   acetaminophen (TYLENOL) 500 MG tablet Take 1,000 mg by mouth at bedtime.   aspirin EC 81 MG tablet Take 81 mg by mouth daily. Swallow whole.   CALCIUM PO Take 600 mg by mouth daily.   Cholecalciferol (VITAMIN D) 50 MCG (2000 UT) tablet Take 2,000 Units by mouth daily.   clonazePAM (KLONOPIN) 0.5 MG tablet Take 1 tablet (0.5 mg total) by mouth 2 (two) times daily as needed for anxiety.   ezetimibe (ZETIA) 10 MG tablet Take 1 tablet (10 mg total)  by mouth daily.   FLUoxetine (PROZAC) 40 MG capsule Take 1 capsule (40 mg total) by mouth daily.   fluticasone (FLONASE) 50 MCG/ACT nasal spray PLACE 2 SPRAYS INTO BOTH NOSTRILS DAILY (Patient taking differently: Place 2 sprays into both nostrils daily as needed for allergies.)   furosemide (LASIX) 20 MG tablet Take 1 tablet (20 mg total) by mouth daily.   Multiple Vitamins-Minerals (PRESERVISION AREDS 2+MULTI VIT PO) Take 1 capsule by mouth in the morning and at bedtime.   NIFEdipine (PROCARDIA XL/NIFEDICAL XL) 60 MG 24 hr tablet Take 1 tablet (60 mg total) by mouth daily.   omeprazole (PRILOSEC) 20 MG capsule TAKE 1 TABLET DAILY   RESTASIS 0.05 % ophthalmic emulsion Place 1 drop into both eyes 2 (two) times daily.   sildenafil (REVATIO) 20 MG tablet Take 1 tablet (20 mg total) by mouth 2 (two) times daily.   SYNTHROID 75 MCG tablet TAKE (1) TABLET DAILY BE- FORE BREAKFAST.     Allergies:   Bactrim [sulfamethoxazole-trimethoprim], Dilaudid [hydromorphone hcl], Dilaudid [hydromorphone],  Acyclovir and related, Crestor [rosuvastatin calcium], Gabapentin, Aleve [naproxen sodium], and Lyrica [pregabalin]   Social History   Socioeconomic History   Marital status: Married    Spouse name: Not on file   Number of children: 2   Years of education: Not on file   Highest education level: Some college, no degree  Occupational History   Occupation: retired Occupational psychologist at [employer]  Tobacco Use   Smoking status: Never   Smokeless tobacco: Never   Tobacco comments:    passive tobacco smoke exposure as child  Vaping Use   Vaping Use: Never used  Substance and Sexual Activity   Alcohol use: No   Drug use: No   Sexual activity: Not Currently    Birth control/protection: Surgical  Other Topics Concern   Not on file  Social History Narrative   Regular exercise: walkingCaffeine use: 1 cup of coffee daily   Lives in split level home with her husband   Social Determinants of Health   Financial Resource Strain: [place]  (06/03/2022)   Overall Financial Resource Strain (CARDIA)    Difficulty of Paying Living Expenses: Not hard at all  Food Insecurity: No Food Insecurity (06/02/2022)   Hunger Vital Sign    Worried About Running Out of Food in the Last Year: Never true    [place] in the Last Year: Never true  Transportation Needs: No Transportation Needs (06/03/2022)   PRAPARE - Hydrologist (Medical): No    Lack of Transportation (Non-Medical): No  Physical Activity: Inactive (06/02/2022)   Exercise Vital Sign    Days of Exercise per Week: 0 days    Minutes of Exercise per Session: 0 min  Stress: No Stress Concern Present (06/02/2022)   [place]    Feeling of Stress : Only a little  Social Connections: Moderately Integrated (06/02/2022)   Social Connection and Isolation Panel [NHANES]    Frequency of Communication with Friends and Family: More than three times a week     Frequency of Social Gatherings with Friends and Family: More than three times a week    Attends Religious Services: More than 4 times per year    Active Member of [employer] or Organizations: No    Attends Archivist Meetings: Never    Marital Status: Married     Family History: The patient's family history includes Cancer  in her father; Hip fracture in her mother; Mental illness in her mother; Pancreatic cancer in her father; Rashes / Skin problems in her daughter; Raynaud syndrome in her father.  ROS:   Please see the history of present illness.    All other systems reviewed and are negative.  EKGs/Labs/Other Studies Reviewed:    The following studies were reviewed today:   [facility]  TAVR OPERATIVE NOTE     Date of Procedure:                05/20/2022   Preoperative Diagnosis:      Severe Aortic Stenosis    Postoperative Diagnosis:    Same    Procedure:        Transcatheter Aortic Valve Replacement - Transfemoral Approach             Edwards Sapien 3 Resilia THV (size 72m, model # 9755RLS, serial # [id number])              Co-Surgeons:                         BGaye Pollack MD and ALenna Sciara MD Anesthesiologist:                  WRoderic Palau MD   Echocardiographer:              PJenkins Rouge MD   Pre-operative Echo Findings: Severe aortic stenosis Normal left ventricular systolic function   Post-operative Echo Findings: No paravalvular leak Normal left ventricular systolic function   Left Heart Catheterization Findings: Left ventricular end-diastolic pressure of [id number]  __________________    Echo: 05/21/2022 1. Left ventricular ejection fraction, by estimation, is 60 to 65%. The  left ventricle has normal function. The left ventricle has no regional  wall motion abnormalities. There is moderate left ventricular hypertrophy.  Left ventricular diastolic  parameters are indeterminate.   2. Right ventricular systolic  function is normal. The right ventricular  size is normal. There is normal pulmonary artery systolic pressure.   3. Left atrial size was mildly dilated.   4. Moderate functional MS due to MAC. The mitral valve is degenerative.  Trivial mitral valve regurgitation. Moderate mitral stenosis. Severe  mitral annular calcification.   5. Tricuspid valve regurgitation is moderate.   6. Post TAVR with 23 mm Sapien 3 valve implant 05/20/22 no PVL mean  gradient 11.5 mm peak 24.8 mm DVI 0.74 AVA 2.5 cm2 Trivial central AR .  The aortic valve has been repaired/replaced. Aortic valve regurgitation is  trivial. No aortic stenosis is  present.   7. The inferior vena cava is normal in size with greater than 50%  respiratory variability, suggesting right atrial pressure of 3 mmHg.    ___________________   Pacemaker Placement: 07/27/2021 CONCLUSIONS:  1. Severe AS s/p TAVR c/b complete heart block 2. Dual chamber permanent pacemaker with left bundle area lead 3. No early apparent complications.    ________________________   Echo 06/23/22 IMPRESSIONS   1. Left ventricular ejection fraction, by estimation, is 60 to 65%. The  left ventricle has normal function. The left ventricle has no regional  wall motion abnormalities. Left ventricular diastolic parameters are  consistent with Grade II diastolic  dysfunction (pseudonormalization). Elevated left ventricular end-diastolic  pressure.   2. Right ventricular systolic function is normal. The right ventricular  size is normal. There is mildly elevated pulmonary artery systolic  pressure.  3. Left atrial size was severely dilated.   4. The mitral valve is degenerative. Trivial mitral valve regurgitation.  Mild to moderate mitral stenosis. The mean mitral valve gradient is 6.0  mmHg at a HR of 66bpm. Severe mitral annular calcification.   5. The aortic valve has been repaired/replaced. There is a 23 mm Ultra,  stented (TAVR) valve present in the  aortic position.      Perivalvul Aortic valve regurgitation is trivial. No aortic stenosis  is present. Aortic valve area, by VTI measures 2.85 cm. Aortic valve mean  gradient measures 13.0 mmHg. Aortic valve Vmax measures 2.65 m/s. DVI 0.69   6. The inferior vena cava is normal in size with greater than 50%  respiratory variability, suggesting right atrial pressure of 3 mmHg.   7. Compared to study dated 05/20/2022, the mean TAVR gradient has  incrased from 7 to 34mhg and there is a trivial perivalvular leak.    ________________________   Device transmission 08/01/22  Per Transmission: Device function is normal. Presenting is NSR 85bpm. There are 3 mode switches noted/ MS burden <1% Only mode switch episode recorded was an A Tach with avg bpm of 155, max 172. Lasting 6 seconds.  (Jan 23rd, 2:45pm).    Notified K. TGrandville Silos PA-C.  She will be following up with patient tomorrow.                          EPISODE: JANUARY 23RD, 2:43PM                   Note    EKG:  EKG is ordered today.  The ekg ordered today demonstrates NSR HR 66  Recent Labs: 02/13/2022: TSH 1.460 05/12/2022: ALT 12 05/25/2022: B Natriuretic Peptide 1,646.1 05/29/2022: Magnesium 1.7 06/06/2022: BUN 15; Creatinine, Ser 0.73; Potassium 4.2; Sodium 138 07/24/2022: Hemoglobin 11.2; Platelets 239  Recent Lipid Panel    Component Value Date/Time   CHOL 146 02/13/2022 1445   TRIG 144 02/13/2022 1445   TRIG 176 (H) 09/04/2014 1419   HDL 50 02/13/2022 1445   HDL 44 09/04/2014 1419   CHOLHDL 2.9 02/13/2022 1445   LDLCALC 71 02/13/2022 1445   LDLCALC 92 11/29/2013 1718     Risk Assessment/Calculations:    CHA2DS2-VASc Score = 5   This indicates a 7.2% annual risk of stroke. The patient's score is based upon: CHF History: 1 HTN History: 1 Diabetes History: 0 Stroke History: 0 Vascular Disease History: 0 Age Score: 2 Gender Score: 1      Physical Exam:    VS:  BP 130/60    Pulse 66   Ht _0  (1.575 m)   Wt 104 lb (47.2 kg)   SpO2 96%   BMI 19.02 kg/m     Wt Readings from Last 3 Encounters:  08/01/22 104 lb (47.2 kg)  07/22/22 102 lb 9.6 oz (46.5 kg)  06/20/22 99 lb 12.8 oz (45.3 kg)     GEN:  Well nourished, well developed in no acute distress, thin and frail appeaing HEENT: Normal NECK: No JVD LYMPHATICS: No lymphadenopathy CARDIAC: RRR, no murmurs, rubs, gallops RESPIRATORY:  Clear to auscultation without rales, wheezing or rhonchi  ABDOMEN: Soft, non-tender, non-distended MUSCULOSKELETAL:  No edema; No deformity  SKIN: Warm and dry NEUROLOGIC:  Alert and oriented x 3 PSYCHIATRIC:  Normal affect   ASSESSMENT:    1. SOB (shortness of breath)   2. Other fatigue   3. S/P TAVR (  transcatheter aortic valve replacement)   4. S/P placement of cardiac pacemaker   5. Atrial flutter, unspecified type ([place])   6. Chronic respiratory failure with hypoxia (HCC)   7. Chronic heart failure with preserved ejection fraction (HCC)   8. Iron deficiency anemia due to chronic blood loss   9. Mitral valve stenosis, unspecified etiology   10. [name] without gangrene    PLAN:    In order of problems listed above:  SOB/Fatigue: noted this to be worse after coming of daytime 02. I review home log which shows 02 sats in low-mid 90s and HR in low 100s (after activity). I think her symptoms are mostly related to deconditioning. I told her it was okay to go back on daytime 02 if this makes her feel better and to try to exercise as much as possible. Could not afford cardiac rehab. I checked her device and there were no worrisome arrhthymias. She appears euvolemic. Will check BNP  to rule out occult CHF.   Severe AS s/p TAVR: stable by echo last month. Continue aspirin.    CHB: s/p PPM placement with an Assurity MRI on 05/27/2022 by Dr. Quentin Ore.     Atrial Fib/flutter: new onset during most recent admission. Started on Eliquis 2.9m BID. Now stopped due to  melena and acute blood loss anemia. I think we should keep this off for now. If device shows a large afib burden, we may want to consider re-challenging it or considering LAAO in the future. as above, no recurrence on home interrogation   Chronic respiratory failure with hypoxia: seen by pulm during her most recent admission. There was concern for intersitial lung disease/CREST syndrome and it was recommended to pursue HRCT as an outpatient. She recently saw Dr. WMelvyn Novaswho had a low suspicion for this and changed oxygen to PRN in day and only QHS. Continue follow up with Pulm   Chronic HFpEF: continue Lasix 26mdaily. Appears euvolemic. As above, check BNP    Chronic Iron Deficiency Anemia: follows closely with heme onc. Gets regular iron infusions. Hg stable ~11 from labs last week.    Moderate mitral stenosis with MAC: echo today shows mild to mod MS.    H/o Raynaud's & scleroderma: Sildenafil continued. Continue follow up with rhuem.    Medication Adjustments/Labs and Tests Ordered: Current medicines are reviewed at length with the patient today.  Concerns regarding medicines are outlined above.  Orders Placed This Encounter  Procedures   Basic metabolic panel   Pro b natriuretic peptide (BNP)   EKG 12-Lead   No orders of the defined types were placed in this encounter.   Patient Instructions  Medication Instructions:  Your physician recommends that you continue on your current medications as directed. Please refer to the Current Medication list given to you today.  *If you need a refill on your cardiac medications before your next appointment, please call your pharmacy*   Lab Work: Bmp, Pro Bnp - today   If you have labs (blood work) drawn today and your tests are completely normal, you will receive your results only by: [facility] [place] have [facility]Chart) OR A paper copy in the mail If you have any lab test that is abnormal or we need to change your treatment, we will call  you to review the results.   Testing/Procedures: None ordered    Follow-Up: Follow up as scheduled   Other Instructions     Signed, KaAngelena FormPA-C  08/01/2022 10:49 AM  [facility]

## 2022-08-01 ENCOUNTER — Inpatient Hospital Stay (INDEPENDENT_AMBULATORY_CARE_PROVIDER_SITE_OTHER): Payer: Medicare Other | Admitting: Physician Assistant

## 2022-08-01 VITALS — BP 130/60 | HR 66 | Ht 62.0 in | Wt 104.0 lb

## 2022-08-01 DIAGNOSIS — I4892 Unspecified atrial flutter: Secondary | ICD-10-CM

## 2022-08-01 DIAGNOSIS — D500 Iron deficiency anemia secondary to blood loss (chronic): Secondary | ICD-10-CM | POA: Diagnosis not present

## 2022-08-01 DIAGNOSIS — I050 Rheumatic mitral stenosis: Secondary | ICD-10-CM | POA: Diagnosis not present

## 2022-08-01 DIAGNOSIS — J9611 Chronic respiratory failure with hypoxia: Secondary | ICD-10-CM | POA: Diagnosis not present

## 2022-08-01 DIAGNOSIS — R5383 Other fatigue: Secondary | ICD-10-CM | POA: Diagnosis not present

## 2022-08-01 DIAGNOSIS — I7300 Raynaud's syndrome without gangrene: Secondary | ICD-10-CM

## 2022-08-01 DIAGNOSIS — Z952 Presence of prosthetic heart valve: Secondary | ICD-10-CM | POA: Diagnosis not present

## 2022-08-01 DIAGNOSIS — Z950 Presence of cardiac pacemaker: Secondary | ICD-10-CM

## 2022-08-01 DIAGNOSIS — I5032 Chronic diastolic (congestive) heart failure: Secondary | ICD-10-CM | POA: Diagnosis not present

## 2022-08-01 DIAGNOSIS — R0602 Shortness of breath: Secondary | ICD-10-CM | POA: Diagnosis not present

## 2022-08-01 DIAGNOSIS — I442 Atrioventricular block, complete: Secondary | ICD-10-CM | POA: Diagnosis not present

## 2022-08-01 NOTE — Telephone Encounter (Signed)
Reviewed with pt in office today. Thank you!

## 2022-08-01 NOTE — Patient Instructions (Addendum)
Medication Instructions:  Your physician recommends that you continue on your current medications as directed. Please refer to the Current Medication list given to you today.  *If you need a refill on your cardiac medications before your next appointment, please call your pharmacy*   Lab Work: Bmp, Pro Bnp - today   If you have labs (blood work) drawn today and your tests are completely normal, you will receive your results only by: [place] (if you have MyChart) OR A paper copy in the mail If you have any lab test that is abnormal or we need to change your treatment, we will call you to review the results.   Testing/Procedures: None ordered    Follow-Up: Follow up as scheduled   Other Instructions

## 2022-08-04 ENCOUNTER — Telehealth: Payer: Self-pay

## 2022-08-04 LAB — BASIC METABOLIC PANEL
BUN/Creatinine Ratio: 22 (ref 12–28)
BUN: 16 mg/dL (ref 8–27)
CO2: 23 mmol/L (ref 20–29)
Calcium: 9.4 mg/dL (ref 8.7–10.3)
Chloride: 102 mmol/L (ref 96–106)
Creatinine, Ser: 0.73 mg/dL (ref 0.57–1.00)
Glucose: 89 mg/dL (ref 70–99)
Potassium: 4.2 mmol/L (ref 3.5–5.2)
Sodium: 139 mmol/L (ref 134–144)
eGFR: 83 mL/min/{1.73_m2} (ref >59)

## 2022-08-04 LAB — PRO B NATRIURETIC PEPTIDE: NT-Pro BNP: 684 pg/mL (ref 0–738)

## 2022-08-06 ENCOUNTER — Other Ambulatory Visit: Payer: Self-pay

## 2022-08-06 DIAGNOSIS — D649 Anemia, unspecified: Secondary | ICD-10-CM

## 2022-08-07 ENCOUNTER — Inpatient Hospital Stay: Payer: Medicare Other | Attending: Hematology

## 2022-08-07 DIAGNOSIS — Z7901 Long term (current) use of anticoagulants: Secondary | ICD-10-CM | POA: Diagnosis not present

## 2022-08-07 DIAGNOSIS — D500 Iron deficiency anemia secondary to blood loss (chronic): Secondary | ICD-10-CM | POA: Diagnosis not present

## 2022-08-07 DIAGNOSIS — D649 Anemia, unspecified: Secondary | ICD-10-CM

## 2022-08-07 LAB — SAMPLE TO BLOOD BANK

## 2022-08-07 LAB — CBC
HCT: 33.5 % — ABNORMAL LOW (ref 36.0–46.0)
Hemoglobin: 10.5 g/dL — ABNORMAL LOW (ref 12.0–15.0)
MCH: 29.7 pg (ref 26.0–34.0)
MCHC: 31.3 g/dL (ref 30.0–36.0)
MCV: 94.6 fL (ref 80.0–100.0)
Platelets: 255 10*3/uL (ref 150–400)
RBC: 3.54 MIL/uL — ABNORMAL LOW (ref 3.87–5.11)
RDW: 16.5 % — ABNORMAL HIGH (ref 11.5–15.5)
WBC: 6.3 10*3/uL (ref 4.0–10.5)
nRBC: 0 % (ref 0.0–0.2)

## 2022-08-08 ENCOUNTER — Inpatient Hospital Stay: Payer: Medicare Other

## 2022-08-11 NOTE — Telephone Encounter (Signed)
error 

## 2022-08-18 ENCOUNTER — Ambulatory Visit (INDEPENDENT_AMBULATORY_CARE_PROVIDER_SITE_OTHER): Payer: Medicare Other | Admitting: Nurse Practitioner

## 2022-08-18 ENCOUNTER — Encounter: Payer: Self-pay | Admitting: Nurse Practitioner

## 2022-08-18 VITALS — BP 127/74 | HR 82 | Temp 97.4°F | Resp 20 | Ht 62.0 in | Wt 102.0 lb

## 2022-08-18 DIAGNOSIS — I5033 Acute on chronic diastolic (congestive) heart failure: Secondary | ICD-10-CM

## 2022-08-18 DIAGNOSIS — F3342 Major depressive disorder, recurrent, in full remission: Secondary | ICD-10-CM

## 2022-08-18 DIAGNOSIS — I10 Essential (primary) hypertension: Secondary | ICD-10-CM

## 2022-08-18 DIAGNOSIS — E785 Hyperlipidemia, unspecified: Secondary | ICD-10-CM | POA: Diagnosis not present

## 2022-08-18 DIAGNOSIS — E875 Hyperkalemia: Secondary | ICD-10-CM

## 2022-08-18 DIAGNOSIS — K219 Gastro-esophageal reflux disease without esophagitis: Secondary | ICD-10-CM

## 2022-08-18 DIAGNOSIS — E039 Hypothyroidism, unspecified: Secondary | ICD-10-CM | POA: Diagnosis not present

## 2022-08-18 DIAGNOSIS — K582 Mixed irritable bowel syndrome: Secondary | ICD-10-CM | POA: Diagnosis not present

## 2022-08-18 DIAGNOSIS — F419 Anxiety disorder, unspecified: Secondary | ICD-10-CM | POA: Diagnosis not present

## 2022-08-18 DIAGNOSIS — M349 Systemic sclerosis, unspecified: Secondary | ICD-10-CM

## 2022-08-18 DIAGNOSIS — Z952 Presence of prosthetic heart valve: Secondary | ICD-10-CM | POA: Diagnosis not present

## 2022-08-18 DIAGNOSIS — I442 Atrioventricular block, complete: Secondary | ICD-10-CM

## 2022-08-18 DIAGNOSIS — D500 Iron deficiency anemia secondary to blood loss (chronic): Secondary | ICD-10-CM | POA: Diagnosis not present

## 2022-08-18 DIAGNOSIS — I7300 Raynaud's syndrome without gangrene: Secondary | ICD-10-CM

## 2022-08-18 DIAGNOSIS — I350 Nonrheumatic aortic (valve) stenosis: Secondary | ICD-10-CM | POA: Diagnosis not present

## 2022-08-18 MED ORDER — FLUOXETINE HCL 40 MG PO CAPS: 40.0000 mg | ORAL_CAPSULE | Freq: Every day | ORAL | 1 refills | Status: DC

## 2022-08-18 MED ORDER — CLONAZEPAM 0.5 MG PO TABS: 0.5000 mg | ORAL_TABLET | Freq: Two times a day (BID) | ORAL | 1 refills | Status: DC | PRN

## 2022-08-18 MED ORDER — OMEPRAZOLE 20 MG PO CPDR: DELAYED_RELEASE_CAPSULE | ORAL | 1 refills | Status: DC

## 2022-08-18 MED ORDER — SILDENAFIL CITRATE 20 MG PO TABS: 20.0000 mg | ORAL_TABLET | Freq: Two times a day (BID) | ORAL | 1 refills | Status: DC

## 2022-08-18 MED ORDER — NIFEDIPINE ER OSMOTIC RELEASE 60 MG PO TB24: 60.0000 mg | ORAL_TABLET | Freq: Every day | ORAL | 1 refills | Status: DC

## 2022-08-18 MED ORDER — SYNTHROID 75 MCG PO TABS: ORAL_TABLET | ORAL | 1 refills | Status: DC

## 2022-08-18 MED ORDER — EZETIMIBE 10 MG PO TABS: 10.0000 mg | ORAL_TABLET | Freq: Every day | ORAL | 1 refills | Status: DC

## 2022-08-18 NOTE — Patient Instructions (Signed)

## 2022-08-18 NOTE — Progress Notes (Signed)
Subjective:    Patient ID: Haley Trevino, female    DOB: 05-19-1941, 82 y.o.   MRN: [id number]   Chief Complaint: medical management of chronic issues     HPI:  Haley Trevino is a 82 y.o. who identifies as a female who was assigned female at birth.   Social history: Lives with: husband Work history: retired   Scientist, forensic in today for follow up of the following chronic medical issues:  1. Primary hypertension No c/o chest pain,  or headache. She has not been checking her blood pressure at home. BP Readings from Last 3 Encounters:  08/01/22 130/60  07/24/22 (!) 106/42  07/22/22 130/82     2. Hyperlipidemia with target LDL less than 100 Has very poor appetite. Has not been able to do any exercise recently Lab Results  Component Value Date   CHOL 146 02/13/2022   HDL 50 02/13/2022   LDLCALC 71 02/13/2022   TRIG 144 02/13/2022   CHOLHDL 2.9 02/13/2022     3. Acute on chronic diastolic heart failure ([place]) 4. Heart block AV complete ([place]) 5. Nonrheumatic aortic valve stenosis 6. S/P TAVR (transcatheter aortic valve replacement) Patient had valve replacement in NOvember. She had follow up with cardiology on 08/01/22 with c/o of SOB and fatigue. She was put back on daytime oxygen and try to do some exercise daily. Her BNP was normal so no CHF. She says if she does anything she has to use her oxygen. She had pacemaker put on 05/20/22  7. Gastroesophageal reflux disease without esophagitis Is on omeprazole daily and is doing well.  8. Irritable bowel syndrome with both constipation and diarrhea No recent flare ups  9. Acquired hypothyroidism No issues that she is aware of. Lab Results  Component Value Date   TSH 1.460 02/13/2022      10. Hyperkalemia No cramping Lab Results  Component Value Date   K 4.2 08/01/2022     11. Iron deficiency anemia due to chronic blood loss Very fatigued. Cardiology did not address her low hgb. She see hematology and has  been getting iron transfusions Lab Results  Component Value Date   HGB 10.5 (L) 08/07/2022     12. SCLERODERMA See rhematology- she just feels terrible and is not sure if this is playing a aprt.  13. Anxiety She stay anxious because of her and her husbands health    08/18/2022    1:54 PM 02/13/2022    2:12 PM 11/12/2020   12:38 PM 02/20/2020    4:09 PM  GAD 7 : Generalized Anxiety Score  Nervous, Anxious, on Edge 0 0 1 3  Control/stop worrying 0 0 0 0  Worry too much - different things 0 0 0 0  Trouble relaxing 0 0 0 3  Restless 0 0 0 0  Easily annoyed or irritable 0 0 0 0  Afraid - awful might happen 0 0 1 1  Total GAD 7 Score 0 0 2 7  Anxiety Difficulty Not difficult at all Not difficult at all Not difficult at all Not difficult at all      14. Recurrent major depressive disorder, in full remission ([place]) Is on prozac and says she is doing ok. Most of her depression is because of her health    08/18/2022    1:53 PM 06/02/2022    2:56 PM 06/02/2022    2:55 PM  Depression screen PHQ 2/9  Decreased Interest 0 0 0  Down, Depressed, Hopeless 0  0 0  PHQ - 2 Score 0 0 0  Altered sleeping 0    Tired, decreased energy 0    Change in appetite 0    Feeling bad or failure about yourself  0    Trouble concentrating 0    Moving slowly or fidgety/restless 0    Suicidal thoughts 0    PHQ-9 Score 0    Difficult doing work/chores Not difficult at all        New complaints: None  today other then what was discussed abnove.  Allergies  Allergen Reactions   Bactrim [Sulfamethoxazole-Trimethoprim] Nausea And Vomiting   Dilaudid [Hydromorphone Hcl] Anaphylaxis   Dilaudid [Hydromorphone] Anaphylaxis   Acyclovir And Related     Unknown reaction   Crestor [Rosuvastatin Calcium] Other (See Comments)    Muscle pain   Gabapentin Swelling    Swelling in feet, and in legs   Aleve [Naproxen Sodium] Rash   Lyrica [Pregabalin] Other (See Comments)    Swelling in feet, and in legs    Outpatient Encounter Medications as of 08/18/2022  Medication Sig   acetaminophen (TYLENOL) 500 MG tablet Take 1,000 mg by mouth at bedtime.   aspirin EC 81 MG tablet Take 81 mg by mouth daily. Swallow whole.   CALCIUM PO Take 600 mg by mouth daily.   Cholecalciferol (VITAMIN D) 50 MCG (2000 UT) tablet Take 2,000 Units by mouth daily.   clonazePAM (KLONOPIN) 0.5 MG tablet Take 1 tablet (0.5 mg total) by mouth 2 (two) times daily as needed for anxiety.   ezetimibe (ZETIA) 10 MG tablet Take 1 tablet (10 mg total) by mouth daily.   FLUoxetine (PROZAC) 40 MG capsule Take 1 capsule (40 mg total) by mouth daily.   fluticasone (FLONASE) 50 MCG/ACT nasal spray PLACE 2 SPRAYS INTO BOTH NOSTRILS DAILY (Patient taking differently: Place 2 sprays into both nostrils daily as needed for allergies.)   furosemide (LASIX) 20 MG tablet Take 1 tablet (20 mg total) by mouth daily.   Multiple Vitamins-Minerals (PRESERVISION AREDS 2+MULTI VIT PO) Take 1 capsule by mouth in the morning and at bedtime.   NIFEdipine (PROCARDIA XL/NIFEDICAL XL) 60 MG 24 hr tablet Take 1 tablet (60 mg total) by mouth daily.   omeprazole (PRILOSEC) 20 MG capsule TAKE 1 TABLET DAILY   PROLIA 60 MG/ML SOSY injection Inject 60 mg into the skin every 6 (six) months. (Patient not taking: Reported on 08/01/2022)   RESTASIS 0.05 % ophthalmic emulsion Place 1 drop into both eyes 2 (two) times daily.   sildenafil (REVATIO) 20 MG tablet Take 1 tablet (20 mg total) by mouth 2 (two) times daily.   SYNTHROID 75 MCG tablet TAKE (1) TABLET DAILY BE- FORE BREAKFAST.   No facility-administered encounter medications on file as of 08/18/2022.    Past Surgical History:  Procedure Laterality Date   BREAST ENHANCEMENT SURGERY  1975   BREAST IMPLANT REMOVAL Bilateral 04/23/2016   Procedure: REMOVALBILATERAL BREAST IMPLANTS;  Surgeon: Wallace Going, DO;  Location: [facility];  Service: Plastics;  Laterality: Bilateral;    CHOLECYSTECTOMY  1973   ESOPHAGOGASTRODUODENOSCOPY N/A 10/25/2020   Procedure: ESOPHAGOGASTRODUODENOSCOPY (EGD);  Surgeon: Clarene Essex, MD;  Location: [facility];  Service: Endoscopy;  Laterality: N/A;  enteroscopy   HAND SURGERY  08/2012; 11/2012   HOT HEMOSTASIS N/A 10/25/2020   Procedure: HOT HEMOSTASIS (ARGON PLASMA COAGULATION/BICAP);  Surgeon: Clarene Essex, MD;  Location: [facility];  Service: Endoscopy;  Laterality: N/A;   INTRAOPERATIVE TRANSTHORACIC ECHOCARDIOGRAM N/A 05/13/2022  Procedure: INTRAOPERATIVE TRANSTHORACIC ECHOCARDIOGRAM;  Surgeon: Burnell Blanks, MD;  Location: [facility];  Service: Open Heart Surgery;  Laterality: N/A;   INTRAOPERATIVE TRANSTHORACIC ECHOCARDIOGRAM N/A 05/20/2022   Procedure: INTRAOPERATIVE TRANSTHORACIC ECHOCARDIOGRAM;  Surgeon: Early Osmond, MD;  Location: [facility];  Service: Open Heart Surgery;  Laterality: N/A;   IR RADIOLOGIST EVAL & MGMT  07/27/2017   IR SACROPLASTY BILATERAL  07/31/2017   IR VERTEBROPLASTY CERV/THOR BX INC UNI/BIL INC/INJECT/IMAGING  03/13/2020   IR VERTEBROPLASTY EA ADDL (T&LS) BX INC UNI/BIL INC INJECT/IMAGING  03/14/2020   MELANOMA EXCISION     at 31 yrs of age   ORIF HIP FRACTURE Right 02/22/2014   Procedure: OPEN REDUCTION INTERNAL FIXATION RIGHT HIP;  Surgeon: Sanjuana Kava, MD;  Location: [facility];  Service: Orthopedics;  Laterality: Right;   PACEMAKER IMPLANT N/A 05/27/2022   Procedure: PACEMAKER IMPLANT;  Surgeon: Vickie Epley, MD;  Location: [facility];  Service: Cardiovascular;  Laterality: N/A;   RIGHT HEART CATH AND CORONARY ANGIOGRAPHY N/A 03/28/2022   Procedure: RIGHT HEART CATH AND CORONARY ANGIOGRAPHY;  Surgeon: Early Osmond, MD;  Location: [facility];  Service: Cardiovascular;  Laterality: N/A;   TOTAL ABDOMINAL HYSTERECTOMY  1974   TRANSCATHETER AORTIC VALVE REPLACEMENT, TRANSFEMORAL Left 05/13/2022   Procedure: Transcatheter Aortic Valve Replacement, Transfemoral;  Surgeon:  Burnell Blanks, MD;  Location: [facility];  Service: Open Heart Surgery;  Laterality: Left;   TRANSCATHETER AORTIC VALVE REPLACEMENT, TRANSFEMORAL Left 05/20/2022   Procedure: Transcatheter Aortic Valve Replacement, Transfemoral using an Edwards SAPIEN 3 Ultra 23 MM Heart Valve;  Surgeon: Early Osmond, MD;  Location: [facility];  Service: Open Heart Surgery;  Laterality: Left;  Left Transfemoral    Family History  Problem Relation Age of Onset   Pancreatic cancer Father    Raynaud syndrome Father    Cancer Father        pancreatic   Rashes / Skin problems Daughter        possibly scleraderma   Hip fracture Mother    Mental illness Mother        attempted suicide at 67 yo      Controlled substance contract: n/a     Review of Systems  Constitutional:  Negative for diaphoresis.  Eyes:  Negative for pain.  Respiratory:  Negative for shortness of breath.   Cardiovascular:  Negative for chest pain, palpitations and leg swelling.  Gastrointestinal:  Negative for abdominal pain.  Endocrine: Negative for polydipsia.  Skin:  Negative for rash.  Neurological:  Negative for dizziness, weakness and headaches.  Hematological:  Does not bruise/bleed easily.  All other systems reviewed and are negative.      Objective:   Physical Exam Vitals and nursing note reviewed.  Constitutional:      General: She is not in acute distress.    Appearance: Normal appearance. She is well-developed.  HENT:     Head: Normocephalic.     Right Ear: Tympanic membrane normal.     Left Ear: Tympanic membrane normal.     Nose: Nose normal.     Mouth/Throat:     Mouth: Mucous membranes are moist.  Eyes:     Pupils: Pupils are equal, round, and reactive to light.  Neck:     Vascular: No carotid bruit or JVD.  Cardiovascular:     Rate and Rhythm: Normal rate and regular rhythm.     Heart sounds: Normal heart sounds.  Comments: Audible clicking of heart valve. Pulmonary:      Effort: Pulmonary effort is normal. No respiratory distress.     Breath sounds: Normal breath sounds. No wheezing or rales.  Chest:     Chest wall: No tenderness.  Abdominal:     General: Bowel sounds are normal. There is no distension or abdominal bruit.     Palpations: Abdomen is soft. There is no hepatomegaly, splenomegaly, mass or pulsatile mass.     Tenderness: There is no abdominal tenderness.  Musculoskeletal:        General: Normal range of motion.     Cervical back: Normal range of motion and neck supple.  Lymphadenopathy:     Cervical: No cervical adenopathy.  Skin:    General: Skin is warm and dry.     Comments: Sore on left index finger  Neurological:     Mental Status: She is alert and oriented to person, place, and time.     Deep Tendon Reflexes: Reflexes are normal and symmetric.  Psychiatric:        Behavior: Behavior normal.        Thought Content: Thought content normal.        Judgment: Judgment normal.     BP 127/74   Pulse 82   Temp (!) 97.4 F (36.3 C) (Temporal)   Resp 20   Ht 5' 2"$  (1.575 m)   Wt 102 lb (46.3 kg)   SpO2 97%   BMI 18.66 kg/m        Assessment & Plan:   [name] in today with chief complaint of Medical Management of Chronic Issues   1. Primary hypertension Low sodium diet  2. Hyperlipidemia with target LDL less than 100 Low fat diet - ezetimibe (ZETIA) 10 MG tablet; Take 1 tablet (10 mg total) by mouth daily.  Dispense: 90 tablet; Refill: 1  3. Acute on chronic diastolic heart failure ([place]) 4. Heart block AV complete ([place]) 5. Nonrheumatic aortic valve stenosis 6. S/P TAVR (transcatheter aortic valve replacement) Keep follow up with cardiology  7. Gastroesophageal reflux disease without esophagitis Avoid spicy foods Do not eat 2 hours prior to bedtime  - omeprazole (PRILOSEC) 20 MG capsule; TAKE 1 TABLET DAILY  Dispense: 90 capsule; Refill: 1  8. Irritable bowel syndrome with both constipation and  diarrhea Watch diet to prevent flare up  9. Acquired hypothyroidism Labs pending - SYNTHROID 75 MCG tablet; TAKE (1) TABLET DAILY BE- FORE BREAKFAST.  Dispense: 90 tablet; Refill: 1  10. Hyperkalemia Labs pending  11. Iron deficiency anemia due to chronic blood loss Labs pending  12. SCLERODERMA Referral back to rheumatology - Ambulatory referral to Rheumatology  13. Anxiety Stress management - clonazePAM (KLONOPIN) 0.5 MG tablet; Take 1 tablet (0.5 mg total) by mouth 2 (two) times daily as needed for anxiety.  Dispense: 180 tablet; Refill: 1  14. Recurrent major depressive disorder, in full remission ([facility]) - FLUoxetine (PROZAC) 40 MG capsule; Take 1 capsule (40 mg total) by mouth daily.  Dispense: 90 capsule; Refill: 1  15. [name] without gangrene Keep finger warm - NIFEdipine (PROCARDIA XL/NIFEDICAL XL) 60 MG 24 hr tablet; Take 1 tablet (60 mg total) by mouth daily.  Dispense: 90 tablet; Refill: 1 - sildenafil (REVATIO) 20 MG tablet; Take 1 tablet (20 mg total) by mouth 2 (two) times daily.  Dispense: 180 tablet; Refill: 1    The above assessment and management plan was discussed with the patient. The patient verbalized understanding  of and has agreed to the management plan. Patient is aware to call the clinic if symptoms persist or worsen. Patient is aware when to return to the clinic for a follow-up visit. Patient educated on when it is appropriate to go to the emergency department.   Mary-Margaret Hassell Done, FNP

## 2022-08-19 LAB — CBC WITH DIFFERENTIAL/PLATELET

## 2022-08-19 NOTE — Progress Notes (Signed)
Remote pacemaker transmission.   

## 2022-08-19 NOTE — Addendum Note (Signed)
Addended by: Cheri Kearns A on: 08/19/2022 08:19 AM   Modules accepted: Level of Service

## 2022-08-20 ENCOUNTER — Other Ambulatory Visit: Payer: Self-pay

## 2022-08-20 DIAGNOSIS — D649 Anemia, unspecified: Secondary | ICD-10-CM

## 2022-08-20 LAB — CBC WITH DIFFERENTIAL/PLATELET
Basophils Absolute: 0.1 10*3/uL (ref 0.0–0.2)
Basos: 1 %
EOS (ABSOLUTE): 0.2 10*3/uL (ref 0.0–0.4)
Eos: 2 %
Hematocrit: 37.5 % (ref 34.0–46.6)
Hemoglobin: 11.9 g/dL (ref 11.1–15.9)
Immature Grans (Abs): 0 10*3/uL (ref 0.0–0.1)
Immature Granulocytes: 0 %
Lymphocytes Absolute: 1.6 10*3/uL (ref 0.7–3.1)
Lymphs: 17 %
MCH: 29.1 pg (ref 26.6–33.0)
MCHC: 31.7 g/dL (ref 31.5–35.7)
MCV: 92 fL (ref 79–97)
Monocytes Absolute: 0.6 10*3/uL (ref 0.1–0.9)
Monocytes: 7 %
Neutrophils Absolute: 6.7 10*3/uL (ref 1.4–7.0)
Neutrophils: 73 %
Platelets: 260 10*3/uL (ref 150–450)
RBC: 4.09 x10E6/uL (ref 3.77–5.28)
RDW: 14.5 % (ref 11.7–15.4)
WBC: 9.2 10*3/uL (ref 3.4–10.8)

## 2022-08-20 LAB — CMP14+EGFR
ALT: 13 IU/L (ref 0–32)
AST: 19 IU/L (ref 0–40)
Albumin/Globulin Ratio: 1.9 (ref 1.2–2.2)
Albumin: 4.7 g/dL (ref 3.7–4.7)
Alkaline Phosphatase: 69 IU/L (ref 44–121)
BUN/Creatinine Ratio: 20 (ref 12–28)
BUN: 18 mg/dL (ref 8–27)
Bilirubin Total: <0.2 mg/dL (ref 0.0–1.2)
CO2: 20 mmol/L (ref 20–29)
Calcium: 9.7 mg/dL (ref 8.7–10.3)
Chloride: 102 mmol/L (ref 96–106)
Creatinine, Ser: 0.91 mg/dL (ref 0.57–1.00)
Globulin, Total: 2.5 g/dL (ref 1.5–4.5)
Glucose: 96 mg/dL (ref 70–99)
Potassium: 4.4 mmol/L (ref 3.5–5.2)
Sodium: 138 mmol/L (ref 134–144)
Total Protein: 7.2 g/dL (ref 6.0–8.5)
eGFR: 63 mL/min/{1.73_m2} (ref >59)

## 2022-08-20 LAB — LIPID PANEL
Chol/HDL Ratio: 3.9 ratio (ref 0.0–4.4)
Cholesterol, Total: 192 mg/dL (ref 100–199)
HDL: 49 mg/dL (ref >39)
LDL Chol Calc (NIH): 116 mg/dL — ABNORMAL HIGH (ref 0–99)
Triglycerides: 153 mg/dL — ABNORMAL HIGH (ref 0–149)
VLDL Cholesterol Cal: 27 mg/dL (ref 5–40)

## 2022-08-20 NOTE — Progress Notes (Unsigned)
Haley Trevino, [place] [id number]   CLINIC:  Medical Oncology/Hematology  PCP:  [name], [place] [address] [phone]  REASON FOR VISIT:  Follow-up for iron deficiency anemia  CURRENT THERAPY: IV iron infusions with intermittent PRBC transfusions  INTERVAL HISTORY:   Haley Trevino 82 y.o. female returns for routine follow-up of iron deficiency anemia.  She was last seen by Tarri Abernethy PA-C on 06/17/2022.  Most recently received IV Venofer 300 mg x 4 from 06/17/2022 through 07/24/2022.  At today's visit, she reports feeling fair.  No recent hospitalizations, surgeries, or changes in baseline health status.  She had melena and acute blood loss anemia in December 2024 after she had been placed on Eliquis for her atrial fibrillation/flutter.  Patient self discontinued Eliquis with subsequent resolution of melena.  Per cardiology note (06/20/2022), they agree with holding anticoagulation for the time being but would consider rechallenging or LAAO in the future.  She remains off of Eliquis to date, but is taking 81 mg aspirin. She has not nboticed any recurrent melena since stopping Eliquis; no hematochezia.  She remains fatigued, but is starting to regain some of her energy.  She continues to have dizziness, lightheadedness, "shakiness," and "brain fog."  Her dyspnea on exertion is at baseline and she continues to follow closely with cardiology and pulmonology.  She has chronic musculoskeletal chest wall pain secondary to scleroderma and scarring after removal of breast implants.  No syncopal episodes.  She had previously lost some weight following TAVR in November 2023, but is now back up to her baseline weight.  She has 15% energy and 50% appetite. She endorses that she is maintaining a stable weight.   ASSESSMENT & PLAN:  1.  Iron deficiency anemia - Initially referred to hematology by PCP after labs  obtained were 09/21/2020 significant for normocytic anemia (Hgb 10.2, MCV 83.0) and iron deficiency (ferritin 13) - EGD (10/25/2020): 2 angiodysplastic lesions without bleeding in the duodenum, another angiodysplastic lesion in duodenum; treated by APC - Capsule study (11/29/2020): At least 3 proximal and mid small bowel AVMs, nonbleeding, but also with some possible red material in the proximal colon - Other work-up: SPEP/IFE/light chains unremarkable; LDH normal; CMP unremarkable  - Blood transfusion: PRBC x1 on 12/14/2020 due to Hgb 7.0  - She had TAVR on 05/20/2022, and was hospitalized from 05/13/2022 through 05/29/2022 (PRBC transfusion x 1 on 05/13/2022); hemoglobin was stable at discharge around 9.8. - Was placed on Eliquis for new onset A-fib/flutter in November 2023, but developed profuse melanotic stool within 48 hours of starting Eliquis.  Patient self discontinued her Eliquis and within 24 hours of stopping Eliquis her bowel movements had returned to normal. -Severe anemia with Hgb 6.6 on 06/09/2022, therefore transfused PRBC x 2 units. - She continues to follow with GI (Dr. Watt Climes via [facility]) - Most recent IV iron with Venofer 300 mg x 4 from 06/17/2022 through 07/24/2022 -She denies any melena or hematochezia in the past 3 months. - CBC/D (08/18/2022) shows Hgb 11.9/MCV 92.  Iron panel (08/21/2022): Ferritin 116, iron saturation 19%.  Normal creatinine 0.87. - Differential diagnosis favors iron deficiency anemia secondary to chronic occult GI blood loss; may also be an aspect of malabsorption in the setting of scleroderma, low meat intake, and use of PPI - PLAN: No need for  IV iron at this time - Will discontinue her routine biweekly CBC/D.  Patient encouraged to call cancer center  if she develops any melena or symptoms of anemia so that we can check her labs. - Labs and phone visit in 2 months - Same-day labs (CBC/D, BB sample, ferritin, iron/TIBC, BMP) and RTC in 2  months  PLAN SUMMARY: >> Labs in 2 months to be obtained at [place] = CBC, BMP, ferritin, iron/TIBC >> PHONE visit in 2 months (with labs at [facility] 1 week prior)     REVIEW OF SYSTEMS:   Review of Systems  Constitutional:  Positive for fatigue. Negative for appetite change, chills, diaphoresis, fever and unexpected weight change.  HENT:   Negative for lump/mass and nosebleeds.   Eyes:  Negative for eye problems.  Respiratory:  Positive for shortness of breath (With exertion, stable). Negative for cough and hemoptysis.   Cardiovascular:  Positive for chest pain (Musculoskeletal). Negative for leg swelling and palpitations.  Gastrointestinal:  Negative for abdominal pain, blood in stool, constipation, diarrhea, nausea and vomiting.  Genitourinary:  Negative for hematuria.   Musculoskeletal:  Positive for arthralgias, gait problem (Feels "unsteady") and myalgias.  Skin: Negative.   Neurological:  Positive for dizziness, gait problem (Feels "unsteady"), headaches and numbness. Negative for light-headedness.  Hematological:  Does not bruise/bleed easily.  Psychiatric/Behavioral:  The patient is nervous/anxious.      PHYSICAL EXAM:  ECOG PERFORMANCE STATUS: 2 - Symptomatic, <50% confined to bed  There were no vitals filed for this visit. There were no vitals filed for this visit. Physical Exam Constitutional:      Appearance: Normal appearance.  HENT:     Head: Normocephalic and atraumatic.     Mouth/Throat:     Mouth: Mucous membranes are moist.  Eyes:     Extraocular Movements: Extraocular movements intact.     Pupils: Pupils are equal, round, and reactive to light.  Cardiovascular:     Rate and Rhythm: Normal rate and regular rhythm.     Pulses: Normal pulses.     Heart sounds: Normal heart sounds.  Pulmonary:     Effort: Pulmonary effort is normal.     Breath sounds: Normal breath sounds.  Abdominal:     General: Bowel sounds are normal.      Palpations: Abdomen is soft.     Tenderness: There is no abdominal tenderness.  Musculoskeletal:        General: No swelling.     Right lower leg: No edema.     Left lower leg: No edema.  Lymphadenopathy:     Cervical: No cervical adenopathy.  Skin:    General: Skin is warm and dry.  Neurological:     General: No focal deficit present.     Mental Status: She is alert and oriented to person, place, and time.  Psychiatric:        Mood and Affect: Mood normal.        Behavior: Behavior normal.     PAST MEDICAL/SURGICAL HISTORY:  Past Medical History:  Diagnosis Date   Cataract    Depression    GERD (gastroesophageal reflux disease)    Hyperlipidemia    Hypertension    Hypothyroidism    IBS (irritable bowel syndrome)    Osteoarthritis    Raynaud disease    S/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 Ali Lowe & Bartle   Scleroderma [facility])    Thyroid disease    hypothyroidism   Past Surgical History:  Procedure Laterality  Date   BREAST ENHANCEMENT SURGERY  1975   BREAST IMPLANT REMOVAL Bilateral 04/23/2016   Procedure: REMOVALBILATERAL BREAST IMPLANTS;  Surgeon: Wallace Going, DO;  Location: [place];  Service: Plastics;  Laterality: Bilateral;   CHOLECYSTECTOMY  1973   ESOPHAGOGASTRODUODENOSCOPY N/A 10/25/2020   Procedure: ESOPHAGOGASTRODUODENOSCOPY (EGD);  Surgeon: Clarene Essex, MD;  Location[facility];  Service: Endoscopy;  Laterality: N/A;  enteroscopy   HAND SURGERY  08/2012; 11/2012   HOT HEMOSTASIS N/A 10/25/2020   Procedure: HOT HEMOSTASIS (ARGON PLASMA COAGULATION/BICAP);  Surgeon: Clarene Essex, MD;  Location: [facility];  Service: Endoscopy;  Laterality: N/A;   INTRAOPERATIVE TRANSTHORACIC ECHOCARDIOGRAM N/A 05/13/2022   Procedure: INTRAOPERATIVE TRANSTHORACIC ECHOCARDIOGRAM;  Surgeon: Burnell Blanks, MD;  Location: [place] [facility];  Service: Open  Heart Surgery;  Laterality: N/A;   INTRAOPERATIVE TRANSTHORACIC ECHOCARDIOGRAM N/A 05/20/2022   Procedure: INTRAOPERATIVE TRANSTHORACIC ECHOCARDIOGRAM;  Surgeon: Early Osmond, MD;  Location: [place];  Service: Open Heart Surgery;  Laterality: N/A;   IR RADIOLOGIST EVAL & MGMT  07/27/2017   IR SACROPLASTY BILATERAL  07/31/2017   IR VERTEBROPLASTY CERV/THOR BX INC UNI/BIL INC/INJECT/IMAGING  03/13/2020   IR VERTEBROPLASTY EA ADDL (T&LS) BX INC UNI/BIL INC INJECT/IMAGING  03/14/2020   MELANOMA EXCISION     at 31 yrs of age   ORIF HIP FRACTURE Right 02/22/2014   Procedure: OPEN REDUCTION INTERNAL FIXATION RIGHT HIP;  Surgeon: Sanjuana Kava, MD;  Location: [facility];  Service: Orthopedics;  Laterality: Right;   PACEMAKER IMPLANT N/A 05/27/2022   Procedure: PACEMAKER IMPLANT;  Surgeon: Vickie Epley, MD;  Location: [place] [facility];  Service: Cardiovascular;  Laterality: N/A;   RIGHT HEART CATH AND CORONARY ANGIOGRAPHY N/A 03/28/2022   Procedure: RIGHT HEART CATH AND CORONARY ANGIOGRAPHY;  Surgeon: Early Osmond, MD;  Location: [place] [facility];  Service: Cardiovascular;  Laterality: N/A;   TOTAL ABDOMINAL HYSTERECTOMY  1974   TRANSCATHETER AORTIC VALVE REPLACEMENT, TRANSFEMORAL Left 05/13/2022   Procedure: Transcatheter Aortic Valve Replacement, Transfemoral;  Surgeon: Burnell Blanks, MD;  Location: [place] [facility];  Service: Open Heart Surgery;  Laterality: Left;   TRANSCATHETER AORTIC VALVE REPLACEMENT, TRANSFEMORAL Left 05/20/2022   Procedure: Transcatheter Aortic Valve Replacement, Transfemoral using an Edwards SAPIEN 3 Ultra 23 MM Heart Valve;  Surgeon: Early Osmond, MD;  Location: [place];  Service: Open Heart Surgery;  Laterality: Left;  Left Transfemoral    SOCIAL HISTORY:  Social History   Socioeconomic History   Marital status: Married    Spouse name: Not on file   Number of children: 2   Years of education: Not on file   Highest education level: Some college, no  degree  Occupational History   Occupation: retired Occupational psychologist at [employer]  Tobacco Use   Smoking status: Never   Smokeless tobacco: Never   Tobacco comments:    passive tobacco smoke exposure as child  Vaping Use   Vaping Use: Never used  Substance and Sexual Activity   Alcohol use: No   Drug use: No   Sexual activity: Not Currently    Birth control/protection: Surgical  Other Topics Concern   Not on file  Social History Narrative   Regular exercise: walkingCaffeine use: 1 cup of coffee daily   Lives in split level home with her husband   Social Determinants of Health   Financial Resource Strain: [place]  (06/03/2022)   Overall Financial Resource Strain (CARDIA)    Difficulty of Paying Living  Expenses: Not hard at all  Food Insecurity: No Food Insecurity (06/02/2022)   Hunger Vital Sign    Worried About Running Out of Food in the Last Year: Never true    Ran Out of Food in the Last Year: Never true  Transportation Needs: No Transportation Needs (06/03/2022)   PRAPARE - Hydrologist (Medical): No    Lack of Transportation (Non-Medical): No  Physical Activity: Inactive (06/02/2022)   Exercise Vital Sign    Days of Exercise per Week: 0 days    Minutes of Exercise per Session: 0 min  Stress: No Stress Concern Present (06/02/2022)   [place]    Feeling of Stress : Only a little  Social Connections: Moderately Integrated (06/02/2022)   Social Connection and Isolation Panel [NHANES]    Frequency of Communication with Friends and Family: More than three times a week    Frequency of Social Gatherings with Friends and Family: More than three times a week    Attends Religious Services: More than 4 times per year    Active Member of [employer] or Organizations: No    Attends Archivist Meetings: Never    Marital Status: Married  Human resources officer Violence: Not At Risk  (06/02/2022)   Humiliation, Afraid, Rape, and Kick questionnaire    Fear of Current or Ex-Partner: No    Emotionally Abused: No    Physically Abused: No    Sexually Abused: No    FAMILY HISTORY:  Family History  Problem Relation Age of Onset   Pancreatic cancer Father    Raynaud syndrome Father    Cancer Father        pancreatic   Rashes / Skin problems Daughter        possibly scleraderma   Hip fracture Mother    Mental illness Mother        attempted suicide at 90 yo    CURRENT MEDICATIONS:  Outpatient Encounter Medications as of 08/21/2022  Medication Sig   acetaminophen (TYLENOL) 500 MG tablet Take 1,000 mg by mouth at bedtime.   aspirin EC 81 MG tablet Take 81 mg by mouth daily. Swallow whole.   CALCIUM PO Take 600 mg by mouth daily.   Cholecalciferol (VITAMIN D) 50 MCG (2000 UT) tablet Take 2,000 Units by mouth daily.   clonazePAM (KLONOPIN) 0.5 MG tablet Take 1 tablet (0.5 mg total) by mouth 2 (two) times daily as needed for anxiety.   ezetimibe (ZETIA) 10 MG tablet Take 1 tablet (10 mg total) by mouth daily.   FLUoxetine (PROZAC) 40 MG capsule Take 1 capsule (40 mg total) by mouth daily.   fluticasone (FLONASE) 50 MCG/ACT nasal spray PLACE 2 SPRAYS INTO BOTH NOSTRILS DAILY (Patient taking differently: Place 2 sprays into both nostrils daily as needed for allergies.)   furosemide (LASIX) 20 MG tablet Take 1 tablet (20 mg total) by mouth daily.   Multiple Vitamins-Minerals (PRESERVISION AREDS 2+MULTI VIT PO) Take 1 capsule by mouth in the morning and at bedtime.   NIFEdipine (PROCARDIA XL/NIFEDICAL XL) 60 MG 24 hr tablet Take 1 tablet (60 mg total) by mouth daily.   omeprazole (PRILOSEC) 20 MG capsule TAKE 1 TABLET DAILY   PROLIA 60 MG/ML SOSY injection Inject 60 mg into the skin every 6 (six) months. (Patient not taking: Reported on 08/18/2022)   RESTASIS 0.05 % ophthalmic emulsion Place 1 drop into both eyes 2 (two) times daily.   sildenafil (  REVATIO) 20 MG tablet Take 1  tablet (20 mg total) by mouth 2 (two) times daily.   SYNTHROID 75 MCG tablet TAKE (1) TABLET DAILY BE- FORE BREAKFAST.   No facility-administered encounter medications on file as of 08/21/2022.    ALLERGIES:  Allergies  Allergen Reactions   Bactrim [Sulfamethoxazole-Trimethoprim] Nausea And Vomiting   Dilaudid [Hydromorphone Hcl] Anaphylaxis   Dilaudid [Hydromorphone] Anaphylaxis   Acyclovir And Related     Unknown reaction   Crestor [Rosuvastatin Calcium] Other (See Comments)    Muscle pain   Gabapentin Swelling    Swelling in feet, and in legs   Aleve [Naproxen Sodium] Rash   Lyrica [Pregabalin] Other (See Comments)    Swelling in feet, and in legs    LABORATORY DATA:  I have reviewed the labs as listed.  CBC    Component Value Date/Time   WBC 9.2 08/18/2022 1429   WBC 6.3 08/07/2022 1226   RBC 4.09 08/18/2022 1429   RBC 3.54 (L) 08/07/2022 1226   HGB 11.9 08/18/2022 1429   HCT 37.5 08/18/2022 1429   PLT 260 08/18/2022 1429   MCV 92 08/18/2022 1429   MCH 29.1 08/18/2022 1429   MCH 29.7 08/07/2022 1226   MCHC 31.7 08/18/2022 1429   MCHC 31.3 08/07/2022 1226   RDW 14.5 08/18/2022 1429   LYMPHSABS 1.6 08/18/2022 1429   MONOABS 0.5 06/09/2022 1056   EOSABS 0.2 08/18/2022 1429   BASOSABS 0.1 08/18/2022 1429      Latest Ref Rng & Units 08/18/2022    2:29 PM 08/01/2022   10:50 AM 06/06/2022    1:38 PM  CMP  Glucose 70 - 99 mg/dL 96  89  89   BUN 8 - 27 mg/dL 18  16  15   $ Creatinine 0.57 - 1.00 mg/dL 0.91  0.73  0.73   Sodium 134 - 144 mmol/L 138  139  138   Potassium 3.5 - 5.2 mmol/L 4.4  4.2  4.2   Chloride 96 - 106 mmol/L 102  102  101   CO2 20 - 29 mmol/L 20  23  22   $ Calcium 8.7 - 10.3 mg/dL 9.7  9.4  9.1   Total Protein 6.0 - 8.5 g/dL 7.2     Total Bilirubin 0.0 - 1.2 mg/dL <0.2     Alkaline Phos 44 - 121 IU/L 69     AST 0 - 40 IU/L 19     ALT 0 - 32 IU/L 13       DIAGNOSTIC IMAGING:  I have independently reviewed the relevant imaging and discussed with  the patient.   WRAP UP:  All questions were answered. The patient knows to call the clinic with any problems, questions or concerns.  Medical decision making: Low  Time spent on visit: I spent 15 minutes counseling the patient face to face. The total time spent in the appointment was 22 minutes and more than 50% was on counseling.  Harriett Rush, PA-C  08/21/22 10:28 AM

## 2022-08-21 ENCOUNTER — Inpatient Hospital Stay: Payer: Medicare Other

## 2022-08-21 ENCOUNTER — Inpatient Hospital Stay (HOSPITAL_BASED_OUTPATIENT_CLINIC_OR_DEPARTMENT_OTHER): Payer: Medicare Other | Admitting: Physician Assistant

## 2022-08-21 ENCOUNTER — Other Ambulatory Visit: Payer: Self-pay

## 2022-08-21 VITALS — BP 131/56 | HR 72 | Temp 98.1°F | Resp 18 | Wt 104.1 lb

## 2022-08-21 DIAGNOSIS — D500 Iron deficiency anemia secondary to blood loss (chronic): Secondary | ICD-10-CM

## 2022-08-21 DIAGNOSIS — K921 Melena: Secondary | ICD-10-CM

## 2022-08-21 DIAGNOSIS — Z7901 Long term (current) use of anticoagulants: Secondary | ICD-10-CM | POA: Diagnosis not present

## 2022-08-21 DIAGNOSIS — D649 Anemia, unspecified: Secondary | ICD-10-CM

## 2022-08-21 LAB — CBC WITH DIFFERENTIAL/PLATELET
Abs Immature Granulocytes: 0.02 10*3/uL (ref 0.00–0.07)
Basophils Absolute: 0.1 10*3/uL (ref 0.0–0.1)
Basophils Relative: 1 %
Eosinophils Absolute: 0.2 10*3/uL (ref 0.0–0.5)
Eosinophils Relative: 3 %
HCT: 37.2 % (ref 36.0–46.0)
Hemoglobin: 11.9 g/dL — ABNORMAL LOW (ref 12.0–15.0)
Immature Granulocytes: 0 %
Lymphocytes Relative: 16 %
Lymphs Abs: 1.2 10*3/uL (ref 0.7–4.0)
MCH: 30 pg (ref 26.0–34.0)
MCHC: 32 g/dL (ref 30.0–36.0)
MCV: 93.7 fL (ref 80.0–100.0)
Monocytes Absolute: 0.4 10*3/uL (ref 0.1–1.0)
Monocytes Relative: 5 %
Neutro Abs: 5.7 10*3/uL (ref 1.7–7.7)
Neutrophils Relative %: 75 %
Platelets: 240 10*3/uL (ref 150–400)
RBC: 3.97 MIL/uL (ref 3.87–5.11)
RDW: 15.6 % — ABNORMAL HIGH (ref 11.5–15.5)
WBC: 7.5 10*3/uL (ref 4.0–10.5)
nRBC: 0 % (ref 0.0–0.2)

## 2022-08-21 LAB — COMPREHENSIVE METABOLIC PANEL
ALT: 13 U/L (ref 0–44)
AST: 20 U/L (ref 15–41)
Albumin: 3.8 g/dL (ref 3.5–5.0)
Alkaline Phosphatase: 62 U/L (ref 38–126)
Anion gap: 8 (ref 5–15)
BUN: 17 mg/dL (ref 8–23)
CO2: 24 mmol/L (ref 22–32)
Calcium: 8.9 mg/dL (ref 8.9–10.3)
Chloride: 105 mmol/L (ref 98–111)
Creatinine, Ser: 0.87 mg/dL (ref 0.44–1.00)
GFR, Estimated: >60 mL/min (ref >60)
Glucose, Bld: 119 mg/dL — ABNORMAL HIGH (ref 70–99)
Potassium: 3.9 mmol/L (ref 3.5–5.1)
Sodium: 137 mmol/L (ref 135–145)
Total Bilirubin: 0.3 mg/dL (ref 0.3–1.2)
Total Protein: 7.4 g/dL (ref 6.5–8.1)

## 2022-08-21 LAB — IRON AND TIBC
Iron: 66 ug/dL (ref 28–170)
Saturation Ratios: 19 % (ref 10.4–31.8)
TIBC: 342 ug/dL (ref 250–450)
UIBC: 276 ug/dL

## 2022-08-21 LAB — FERRITIN: Ferritin: 116 ng/mL (ref 11–307)

## 2022-08-21 NOTE — Patient Instructions (Signed)
[facility] [place] **   You were seen today by Tarri Abernethy PA-C for your iron deficiency anemia.   Your blood and iron levels look GREAT today! You do not need any IV iron at this time. We will recheck labs and discuss with a phone visit in 2 months. We will put our lab orders so that they can be drawn at [facility].  Since you will not have a lab appointment on the schedule, it is important that you remember to have your labs drawn 1 week before your scheduled appointment with me.  FOLLOW-UP APPOINTMENT: PHONE visit 1 week after labs (2 months)  ** Thank you for trusting me with your healthcare!  I strive to provide all of my patients with quality care at each visit.  If you receive a survey for this visit, I would be so grateful to you for taking the time to provide feedback.  Thank you in advance!  ~ Oniya Mandarino                   Dr. Derek Jack   &   Tarri Abernethy, PA-C   - - - - - - - - - - - - - - - - - -    Thank you for choosing [facility] at [facility] to provide your oncology and hematology care.  To afford each patient quality time with our provider, please arrive at least 15 minutes before your scheduled appointment time.   If you have a lab appointment with the [facility] please come in thru the Main Entrance and check in at the main information desk.  You need to re-schedule your appointment should you arrive 10 or more minutes late.  We strive to give you quality time with our providers, and arriving late affects you and other patients whose appointments are after yours.  Also, if you no show three or more times for appointments you may be dismissed from the clinic at the providers discretion.     Again, thank you for choosing [facility].  Our hope is that these requests will decrease the amount of time that you wait before being  seen by our physicians.       _____________________________________________________________  Should you have questions after your visit to [facility], please contact our office at [phone] and follow the prompts.  Our office hours are 8:00 a.m. and 4:30 p.m. Monday - Friday.  Please note that voicemails left after 4:00 p.m. may not be returned until the following business day.  We are closed weekends and major holidays.  You do have access to a nurse 24-7, just call the main number to the clinic [phone] and do not press any options, hold on the line and a nurse will answer the phone.    For prescription refill requests, have your pharmacy contact our office and allow 72 hours.

## 2022-08-22 ENCOUNTER — Inpatient Hospital Stay: Payer: Medicare Other

## 2022-08-27 ENCOUNTER — Ambulatory Visit (INDEPENDENT_AMBULATORY_CARE_PROVIDER_SITE_OTHER): Payer: Medicare Other

## 2022-08-27 DIAGNOSIS — I442 Atrioventricular block, complete: Secondary | ICD-10-CM

## 2022-08-27 LAB — CUP PACEART REMOTE DEVICE CHECK
Battery Remaining Longevity: 131 mo
Battery Remaining Percentage: 95.5 %
Battery Voltage: 3.02 V
Brady Statistic AP VP Percent: 1 %
Brady Statistic AP VS Percent: 7.3 %
Brady Statistic AS VP Percent: 1 %
Brady Statistic AS VS Percent: 92 %
Brady Statistic RA Percent Paced: 7 %
Brady Statistic RV Percent Paced: 1 %
Date Time Interrogation Session: 20240221020012
Implantable Lead Connection Status: 753985
Implantable Lead Connection Status: 753985
Implantable Lead Implant Date: 20231121
Implantable Lead Implant Date: 20231121
Implantable Lead Location: 753859
Implantable Lead Location: 753860
Implantable Pulse Generator Implant Date: 20231121
Lead Channel Impedance Value: 440 Ohm
Lead Channel Impedance Value: 460 Ohm
Lead Channel Pacing Threshold Amplitude: 0.625 V
Lead Channel Pacing Threshold Amplitude: 0.875 V
Lead Channel Pacing Threshold Pulse Width: 0.4 ms
Lead Channel Pacing Threshold Pulse Width: 0.5 ms
Lead Channel Sensing Intrinsic Amplitude: 1.1 mV
Lead Channel Sensing Intrinsic Amplitude: 6.9 mV
Lead Channel Setting Pacing Amplitude: 1.125
Lead Channel Setting Pacing Amplitude: 1.625
Lead Channel Setting Pacing Pulse Width: 0.5 ms
Lead Channel Setting Sensing Sensitivity: 2 mV
Pulse Gen Model: 2272
Pulse Gen Serial Number: 6860174

## 2022-09-02 ENCOUNTER — Telehealth: Payer: Self-pay

## 2022-09-02 ENCOUNTER — Ambulatory Visit: Payer: Medicare Other | Attending: Cardiology | Admitting: Cardiology

## 2022-09-02 ENCOUNTER — Encounter: Payer: Self-pay | Admitting: Cardiology

## 2022-09-02 VITALS — BP 152/62 | HR 75 | Ht 62.0 in | Wt 106.0 lb

## 2022-09-02 DIAGNOSIS — Z950 Presence of cardiac pacemaker: Secondary | ICD-10-CM | POA: Insufficient documentation

## 2022-09-02 DIAGNOSIS — I442 Atrioventricular block, complete: Secondary | ICD-10-CM

## 2022-09-02 DIAGNOSIS — I5032 Chronic diastolic (congestive) heart failure: Secondary | ICD-10-CM | POA: Diagnosis not present

## 2022-09-02 NOTE — Patient Instructions (Signed)
Medication Instructions:  Your physician recommends that you continue on your current medications as directed. Please refer to the Current Medication list given to you today.  *If you need a refill on your cardiac medications before your next appointment, please call your pharmacy*  Follow-Up: At [facility], you and your health needs are our priority.  As part of our continuing mission to provide you with exceptional heart care, we have created designated Provider Care Teams.  These Care Teams include your primary Cardiologist (physician) and Advanced Practice Providers (APPs -  Physician Assistants and Nurse Practitioners) who all work together to provide you with the care you need, when you need it.  Your next appointment:   1 year(s)  Provider:   You will see one of the following Advanced Practice Providers on your designated Care Team:   Tommye Standard, [place]" [place], [place], NP

## 2022-09-02 NOTE — Progress Notes (Deleted)
  Electrophysiology Office Follow up Visit Note:    Date:  09/02/2022   ID:  Haley Trevino, DOB [birth year], MRN [id number]  PCP:  [name], [facility] Cardiologist:  [name], MD  [facility] Electrophysiologist:  Vickie Epley, MD    Interval History:    Haley Trevino is a 82 y.o. female who presents for a follow up visit.   She had a pacemaker implanted May 27, 2022.  She has done well since implant.       Past medical, surgical, social and family history were reviewed.  ROS:   Please see the history of present illness.    All other systems reviewed and are negative.  EKGs/Labs/Other Studies Reviewed:    The following studies were reviewed today:  September 02, 2022 in clinic device interrogation personally reviewed ***  EKG:  The ekg ordered today demonstrates ***   Physical Exam:    VS:  There were no vitals taken for this visit.    Wt Readings from Last 3 Encounters:  08/21/22 104 lb 0.9 oz (47.2 kg)  08/18/22 102 lb (46.3 kg)  08/01/22 104 lb (47.2 kg)     GEN: *** Well nourished, well developed in no acute distress CARDIAC: ***RRR, no murmurs, rubs, gallops RESPIRATORY:  Clear to auscultation without rales, wheezing or rhonchi       ASSESSMENT:    No diagnosis found. PLAN:    In order of problems listed above:   #Complete heart block #Permanent pacemaker in situ Device functioning appropriately.  Continue remote monitoring        Total time spent with patient today *** minutes. This includes reviewing records, evaluating the patient and coordinating care.    Signed, Lars Mage, MD, [facility], [facility] 09/02/2022 5:37 AM    Electrophysiology [facility]

## 2022-09-02 NOTE — Progress Notes (Signed)
Electrophysiology Office Follow up Visit Note:    Date:  09/02/2022   ID:  Haley Trevino, DOB [birth year], MRN [id number]  PCP:  [name], [facility] Cardiologist:  [name], MD  [facility] Electrophysiologist:  Vickie Epley, MD    Interval History:    Haley Trevino is a 82 y.o. female who presents for a follow up visit.   She had a pacemaker implanted May 27, 2022.  She has done well since implant.  Today, she states she is not feeling well. She endorses worsening scleroderma and Reynaud's. She is also constantly fatigued. She complains of brain fog and gait instability as well. She denies any issues with LE edema.  She has been chronically anemic for the past 2 years. She has received 32 iron infusions. Previously she was started on Eliquis. This was discontinued when she developed a rectal bleed after 45 days.  Recently she saw her pulmonologist. Now she is only on oxygen at night.  A few years ago she underwent removal of her breast implants. Since then she developed chest pain and tenderness in certain areas.  She denies any palpitations, or peripheral edema. No headaches, syncope, orthopnea, or PND.       Past medical, surgical, social and family history were reviewed.  ROS:   Please see the history of present illness.    (+) Fatigue (+) Shortness of breath (+) Brain fog (+) Gait instability (+) Chest pain secondary to prior surgery All other systems reviewed and are negative.  EKGs/Labs/Other Studies Reviewed:    The following studies were reviewed today:  September 02, 2022 in clinic device interrogation personally reviewed Battery longevity 11.7 years Lead parameter stable Atrial sensitivity adjusted today Atrial pacing 6.6% Ventricular pacing less than 1% 7 mode switches, longest 10 seconds  EKG:  The ekg ordered today demonstrates normal sinus rhythm   Physical Exam:    VS:  BP (!) 152/62   Pulse  75   Ht 5' 2"$  (1.575 m)   Wt 106 lb (48.1 kg)   SpO2 98%   BMI 19.39 kg/m     Wt Readings from Last 3 Encounters:  09/02/22 106 lb (48.1 kg)  08/21/22 104 lb 0.9 oz (47.2 kg)  08/18/22 102 lb (46.3 kg)     GEN:  Well nourished, well developed in no acute distress.  Frail.  Elderly.  Anxious appearing CARDIAC: RRR, no murmurs, rubs, gallops. Pectoral pocket well healed. RESPIRATORY:  Clear to auscultation without rales, wheezing or rhonchi       ASSESSMENT:    1. Heart block AV complete (HCC)   2. Cardiac pacemaker in situ   3. Chronic heart failure with preserved ejection fraction (HCC)    PLAN:    In order of problems listed above:   #Complete heart block #Permanent pacemaker in situ Device functioning appropriately.  Continue remote monitoring  #Chronic diastolic heart failure NYHA class I.  Warm dry on exam.  Change Lasix to as needed.    I,Mathew Stumpf,acting as a Education administrator for Vickie Epley, MD.,have documented all relevant documentation on the behalf of Vickie Epley, MD,as directed by  Vickie Epley, MD while in the presence of Vickie Epley, MD.  I, Vickie Epley, MD, have reviewed all documentation for this visit. The documentation on 09/02/22 for the exam, diagnosis, procedures, and orders are all accurate and complete.   Signed, Lars Mage, MD, [facility], [facility] 09/02/2022 5:29 PM  Electrophysiology [facility]

## 2022-09-02 NOTE — Telephone Encounter (Signed)
Haley Trevino ([name]) PA Case ID #: [id number] Rx #: [id number] Need Help? Call us at [phone] Status sent iconSent to Plan today Drug Sildenafil Citrate '20MG'$  tablets ePA cloud logo Form [facility] Medicare Electronic Prior Authorization Request Form [phone] NCPDP)

## 2022-09-05 ENCOUNTER — Other Ambulatory Visit (HOSPITAL_COMMUNITY): Payer: Self-pay

## 2022-09-05 ENCOUNTER — Encounter: Payer: Self-pay | Admitting: Hematology

## 2022-09-05 NOTE — Telephone Encounter (Signed)
PA approved. Ran test claim, filled 09/02/22 via retail.

## 2022-09-15 ENCOUNTER — Ambulatory Visit: Payer: Medicare Other | Admitting: Cardiology

## 2022-09-22 NOTE — Progress Notes (Signed)
Remote pacemaker transmission.   

## 2022-09-23 DIAGNOSIS — M1991 Primary osteoarthritis, unspecified site: Secondary | ICD-10-CM | POA: Diagnosis not present

## 2022-09-23 DIAGNOSIS — K219 Gastro-esophageal reflux disease without esophagitis: Secondary | ICD-10-CM | POA: Diagnosis not present

## 2022-09-23 DIAGNOSIS — L98499 Non-pressure chronic ulcer of skin of other sites with unspecified severity: Secondary | ICD-10-CM | POA: Diagnosis not present

## 2022-09-23 DIAGNOSIS — M542 Cervicalgia: Secondary | ICD-10-CM | POA: Diagnosis not present

## 2022-09-23 DIAGNOSIS — D508 Other iron deficiency anemias: Secondary | ICD-10-CM | POA: Diagnosis not present

## 2022-09-23 DIAGNOSIS — M340 Progressive systemic sclerosis: Secondary | ICD-10-CM | POA: Diagnosis not present

## 2022-09-23 DIAGNOSIS — Z681 Body mass index (BMI) 19 or less, adult: Secondary | ICD-10-CM | POA: Diagnosis not present

## 2022-09-23 DIAGNOSIS — I7300 Raynaud's syndrome without gangrene: Secondary | ICD-10-CM | POA: Diagnosis not present

## 2022-09-23 DIAGNOSIS — M810 Age-related osteoporosis without current pathological fracture: Secondary | ICD-10-CM | POA: Diagnosis not present

## 2022-10-09 ENCOUNTER — Other Ambulatory Visit (HOSPITAL_BASED_OUTPATIENT_CLINIC_OR_DEPARTMENT_OTHER): Payer: Self-pay

## 2022-10-09 DIAGNOSIS — R0609 Other forms of dyspnea: Secondary | ICD-10-CM

## 2022-10-13 ENCOUNTER — Ambulatory Visit (INDEPENDENT_AMBULATORY_CARE_PROVIDER_SITE_OTHER): Payer: Medicare Other | Admitting: Internal Medicine

## 2022-10-13 DIAGNOSIS — R0609 Other forms of dyspnea: Secondary | ICD-10-CM | POA: Diagnosis not present

## 2022-10-13 LAB — PULMONARY FUNCTION TEST
DL/VA % pred: 61 %
DL/VA: 2.54 ml/min/mmHg/L
DLCO cor % pred: 53 %
DLCO cor: 9.36 ml/min/mmHg
DLCO unc % pred: 50 %
DLCO unc: 8.9 ml/min/mmHg
FEF 25-75 Post: 1.5 L/sec
FEF 25-75 Pre: 1.3 L/sec
FEF2575-%Change-Post: 15 %
FEF2575-%Pred-Post: 120 %
FEF2575-%Pred-Pre: 104 %
FEV1-%Change-Post: 6 %
FEV1-%Pred-Post: 95 %
FEV1-%Pred-Pre: 90 %
FEV1-Post: 1.64 L
FEV1-Pre: 1.55 L
FEV1FVC-%Change-Post: 9 %
FEV1FVC-%Pred-Pre: 101 %
FEV6-%Change-Post: -2 %
FEV6-%Pred-Post: 91 %
FEV6-%Pred-Pre: 94 %
FEV6-Post: 2.01 L
FEV6-Pre: 2.06 L
FEV6FVC-%Change-Post: 0 %
FEV6FVC-%Pred-Post: 106 %
FEV6FVC-%Pred-Pre: 105 %
FVC-%Change-Post: -3 %
FVC-%Pred-Post: 86 %
FVC-%Pred-Pre: 89 %
FVC-Post: 2.01 L
FVC-Pre: 2.07 L
Post FEV1/FVC ratio: 82 %
Post FEV6/FVC ratio: 100 %
Pre FEV1/FVC ratio: 75 %
Pre FEV6/FVC Ratio: 99 %
RV % pred: 100 %
RV: 2.31 L
TLC % pred: 93 %
TLC: 4.42 L

## 2022-10-13 NOTE — Progress Notes (Signed)
Full PFT Performed Today  

## 2022-10-13 NOTE — Patient Instructions (Signed)
Full PFT Performed Today  

## 2022-10-14 NOTE — Progress Notes (Signed)
Spoke with pt and notified of results per Dr. Wert. Pt verbalized understanding and denied any questions. 

## 2022-10-17 ENCOUNTER — Other Ambulatory Visit: Payer: Medicare Other

## 2022-10-17 ENCOUNTER — Inpatient Hospital Stay: Payer: Medicare Other | Attending: Hematology

## 2022-10-17 DIAGNOSIS — D509 Iron deficiency anemia, unspecified: Secondary | ICD-10-CM | POA: Diagnosis not present

## 2022-10-17 DIAGNOSIS — D649 Anemia, unspecified: Secondary | ICD-10-CM

## 2022-10-17 DIAGNOSIS — D500 Iron deficiency anemia secondary to blood loss (chronic): Secondary | ICD-10-CM

## 2022-10-17 LAB — COMPREHENSIVE METABOLIC PANEL
ALT: 12 U/L (ref 0–44)
AST: 17 U/L (ref 15–41)
Albumin: 3.8 g/dL (ref 3.5–5.0)
Alkaline Phosphatase: 60 U/L (ref 38–126)
Anion gap: 8 (ref 5–15)
BUN: 18 mg/dL (ref 8–23)
CO2: 24 mmol/L (ref 22–32)
Calcium: 9 mg/dL (ref 8.9–10.3)
Chloride: 103 mmol/L (ref 98–111)
Creatinine, Ser: 0.93 mg/dL (ref 0.44–1.00)
GFR, Estimated: >60 mL/min (ref >60)
Glucose, Bld: 123 mg/dL — ABNORMAL HIGH (ref 70–99)
Potassium: 4 mmol/L (ref 3.5–5.1)
Sodium: 135 mmol/L (ref 135–145)
Total Bilirubin: 0.3 mg/dL (ref 0.3–1.2)
Total Protein: 7.3 g/dL (ref 6.5–8.1)

## 2022-10-17 LAB — IRON AND TIBC
Iron: 51 ug/dL (ref 28–170)
Saturation Ratios: 14 % (ref 10.4–31.8)
TIBC: 374 ug/dL (ref 250–450)
UIBC: 323 ug/dL

## 2022-10-17 LAB — FERRITIN: Ferritin: 22 ng/mL (ref 11–307)

## 2022-10-17 LAB — CBC
HCT: 34.5 % — ABNORMAL LOW (ref 36.0–46.0)
Hemoglobin: 10.9 g/dL — ABNORMAL LOW (ref 12.0–15.0)
MCH: 29.4 pg (ref 26.0–34.0)
MCHC: 31.6 g/dL (ref 30.0–36.0)
MCV: 93 fL (ref 80.0–100.0)
Platelets: 263 10*3/uL (ref 150–400)
RBC: 3.71 MIL/uL — ABNORMAL LOW (ref 3.87–5.11)
RDW: 13.8 % (ref 11.5–15.5)
WBC: 7.5 10*3/uL (ref 4.0–10.5)
nRBC: 0 % (ref 0.0–0.2)

## 2022-10-20 ENCOUNTER — Telehealth: Payer: Self-pay | Admitting: *Deleted

## 2022-10-20 NOTE — Telephone Encounter (Signed)
Patient called to find out if she needed iron according to labs done on Friday.  Patient c/o dizziness and weakness.  Iron studies do not indicate need for iron infusion.  Advised to make appt with PCP for further work up.  Verbalized understanding.

## 2022-10-21 DIAGNOSIS — M810 Age-related osteoporosis without current pathological fracture: Secondary | ICD-10-CM | POA: Diagnosis not present

## 2022-10-21 DIAGNOSIS — M797 Fibromyalgia: Secondary | ICD-10-CM | POA: Diagnosis not present

## 2022-10-21 DIAGNOSIS — M1991 Primary osteoarthritis, unspecified site: Secondary | ICD-10-CM | POA: Diagnosis not present

## 2022-10-21 DIAGNOSIS — I7300 Raynaud's syndrome without gangrene: Secondary | ICD-10-CM | POA: Diagnosis not present

## 2022-10-21 DIAGNOSIS — K219 Gastro-esophageal reflux disease without esophagitis: Secondary | ICD-10-CM | POA: Diagnosis not present

## 2022-10-21 DIAGNOSIS — M340 Progressive systemic sclerosis: Secondary | ICD-10-CM | POA: Diagnosis not present

## 2022-10-21 DIAGNOSIS — M542 Cervicalgia: Secondary | ICD-10-CM | POA: Diagnosis not present

## 2022-10-21 DIAGNOSIS — L98499 Non-pressure chronic ulcer of skin of other sites with unspecified severity: Secondary | ICD-10-CM | POA: Diagnosis not present

## 2022-10-21 DIAGNOSIS — Z681 Body mass index (BMI) 19 or less, adult: Secondary | ICD-10-CM | POA: Diagnosis not present

## 2022-10-21 DIAGNOSIS — D508 Other iron deficiency anemias: Secondary | ICD-10-CM | POA: Diagnosis not present

## 2022-10-23 ENCOUNTER — Telehealth: Payer: Self-pay

## 2022-10-23 NOTE — Telephone Encounter (Signed)
Remote transmission received and reviewed. Normal device function. AT events noted with longest in duration 4 minutes 40 seconds. This is not a new finding. Patient made aware and states she will follow up with her doctor. Appreciative of call.

## 2022-10-23 NOTE — Telephone Encounter (Signed)
-----   Message from Iona Coach, RN sent at 10/22/2022  3:22 PM EDT ----- Regarding: Pt needs instruction to transmit device check Hey,  Ms Mcquitty called the structural team in hopes that we could advise her on how to transmit from home to check her PPM.  The pt has been experiencing dizziness and her rheumatologist and pulmonologist cannot find any cause for her symptom.  Her rheumatologist advised her to contact cardiology and have her pacemaker checked.  Can you contact her and assist with instructions for a transmission?  The pt is scheduled to see her primary cardiologist, Dr Antoine Poche, in May.   Thank you, Leotis Shames

## 2022-10-24 NOTE — Progress Notes (Unsigned)
VIRTUAL VISIT via TELEPHONE NOTE [facility]   I connected with Haley Trevino  on 10/27/22 at  1:25 PM by telephone and verified that I am speaking with the correct person using two identifiers.  Location: Patient: Home Provider: [facility]   I discussed the limitations, risks, security and privacy concerns of performing an evaluation and management service by telephone and the availability of in person appointments. I also discussed with the patient that there may be a patient responsible charge related to this service. The patient expressed understanding and agreed to proceed.  REASON FOR VISIT:  Follow-up for iron deficiency anemia   CURRENT THERAPY: IV iron infusions with intermittent PRBC transfusions  INTERVAL HISTORY:  Haley Trevino is contacted today for follow-up of iron deficiency anemia.  She was last seen by Rojelio Brenner PA-C 08/21/2022.  At today's visit, she reports feeling fair.    She had melena and acute blood loss anemia in December 2024 after she had been placed on Eliquis for her atrial fibrillation/flutter.  Patient self discontinued Eliquis with subsequent resolution of melena.  Per cardiology note (06/20/2022), they agree with holding anticoagulation for the time being but would consider rechallenging or LAAO in the future.  She remains off of Eliquis to date, but is taking 81 mg aspirin. She has not nboticed any recurrent melena since stopping Eliquis; no hematochezia.  She does report worsening fatigue, dizziness, lightheadedness, presyncopal episodes, and dyspnea on exertion.  She continues to follow closely with cardiology and pulmonology, and reports that she has been told some of her symptoms may be due to pulmonary arterial hypertension.  She also reports increased pain and itching secondary to scleroderma flareup.  She had previously lost some weight following TAVR in November 2023, but is now back up to her  baseline weight.  She reports little to no energy and 50% appetite.    REVIEW OF SYSTEMS:   Review of Systems  Constitutional:  Positive for malaise/fatigue. Negative for chills, diaphoresis, fever and weight loss.  Respiratory:  Positive for shortness of breath. Negative for cough.   Cardiovascular:  Negative for chest pain and palpitations.  Gastrointestinal:  Negative for abdominal pain, blood in stool, melena, nausea and vomiting.  Neurological:  Positive for dizziness and sensory change (itching). Negative for headaches.     PHYSICAL EXAM: (per limitations of virtual telephone visit)  The patient is alert and oriented x 3, exhibiting adequate mentation, good mood, and ability to speak in full sentences and execute sound judgement.  ASSESSMENT & PLAN:  1.    Iron deficiency anemia - Initially referred to hematology by PCP after labs obtained were 09/21/2020 significant for normocytic anemia (Hgb 10.2, MCV 83.0) and iron deficiency (ferritin 13) - EGD (10/25/2020): 2 angiodysplastic lesions without bleeding in the duodenum, another angiodysplastic lesion in duodenum; treated by APC - Capsule study (11/29/2020): At least 3 proximal and mid small bowel AVMs, nonbleeding, but also with some possible red material in the proximal colon - Other work-up: SPEP/IFE/light chains unremarkable; LDH normal; CMP unremarkable  - Blood transfusion: PRBC x1 on 12/14/2020 due to Hgb 7.0  - She had TAVR on 05/20/2022, and was hospitalized from 05/13/2022 through 05/29/2022 (PRBC transfusion x 1 on 05/13/2022); hemoglobin was stable at discharge around 9.8. - Was placed on Eliquis for new onset A-fib/flutter in November 2023, but developed profuse melanotic stool within 48 hours of starting Eliquis.  Patient self discontinued her Eliquis and within 85  hours of stopping Eliquis her bowel movements had returned to normal. -Severe anemia with Hgb 6.6 on 06/09/2022, therefore transfused PRBC x 2 units. - She continues  to follow with GI (Dr. Ewing Schlein via [facility]) - Most recent IV iron with Venofer 300 mg x 4 from 06/17/2022 through 07/24/2022 -She denies any melena or hematochezia in the past 3 months. - She reports feeling increased fatigue - CBC/D (10/17/2022): Hgb 10.9/MCV 93.0, ferritin 22, iron saturation 14%.  Normal creatinine/GFR. - Differential diagnosis favors iron deficiency anemia secondary to chronic GI blood loss; may also be an aspect of malabsorption in the setting of scleroderma, low meat intake, and use of PPI - PLAN: Recommend Venofer 300 mg x 3.   - Labs (CBC/D, BB sample, ferritin, iron/TIBC, BMP) and RTC in 2 months with PHONE visit   PLAN SUMMARY: >> Venofer 300 mg x 3 >> Labs in 2 months = CBC/D, BB sample, ferritin, iron/TIBC, BMP >> PHONE visit in 2 months     I discussed the assessment and treatment plan with the patient. The patient was provided an opportunity to ask questions and all were answered. The patient agreed with the plan and demonstrated an understanding of the instructions.   The patient was advised to call back or seek an in-person evaluation if the symptoms worsen or if the condition fails to improve as anticipated.  I provided 25 minutes of non-face-to-face time during this encounter.  Carnella Guadalajara, PA-C 10/27/22  2:12 PM

## 2022-10-27 ENCOUNTER — Inpatient Hospital Stay (HOSPITAL_BASED_OUTPATIENT_CLINIC_OR_DEPARTMENT_OTHER): Payer: Medicare Other | Admitting: Physician Assistant

## 2022-10-27 ENCOUNTER — Other Ambulatory Visit: Payer: Self-pay

## 2022-10-27 DIAGNOSIS — D500 Iron deficiency anemia secondary to blood loss (chronic): Secondary | ICD-10-CM | POA: Diagnosis not present

## 2022-10-27 NOTE — Progress Notes (Deleted)
Haley Trevino, female    DOB: 06/16/1941    MRN: [id number]   Brief patient profile:  70  yowf   never smoker  referred to pulmonary clinic in [place]  07/22/2022 by Haley Trevino  for doe ? ILD    [place]ards in 2014 > [name] previously better on sulindafil   Admit date: 05/13/2022 Discharge date: 05/29/2022   Primary Care Provider: Bennie Pierini, FNP  Primary Cardiologist:  Haley Rotunda, MD Structural Heart: Haley Trevino & Haley Trevino (TAVR)       Discharge Diagnoses    Principal Problem:   S/P TAVR (transcatheter aortic valve replacement)   [name]   Hypertension   Hyperlipidemia with target LDL less than 100   Nonrheumatic aortic valve stenosis   Acute on chronic diastolic heart failure (HCC)   Acute respiratory distress   Anxiety   [name]   Hyperkalemia   Heart block AV complete (HCC)    History of Present Illness  07/22/2022  Pulmonary/ 1st Trevino eval/ Haley Trevino / Haley Trevino  Chief Complaint  Patient presents with   Consult    Was told something may be wrong with her lungs  Dyspnea:  x  steps despite 02/  no ex program  Cough: minimal / occ choking since in hospital  Sleep: flat bed one pillow under head  SABA use:  02: 2.5  lpm  CP Ant left side  24/7  x years skin is sensitive w/in a year of L  breast surgery Rec Make sure you check your oxygen saturation  AT  your highest level of activity (not after you stop)   to be sure it stays over 90%       10/29/2022  f/u ov/[facility] Trevino/Haley Trevino re: doe with nl pfts 10/13/22  maint on ***  No chief complaint on file.   Dyspnea:  *** Cough: *** Sleeping: *** SABA use: *** 02: *** Covid status: *** Lung cancer screening: ***   No obvious day to day or daytime variability or assoc excess/ purulent sputum or mucus plugs or hemoptysis or cp or chest tightness, subjective wheeze or overt sinus or hb symptoms.   *** without nocturnal  or early am  exacerbation  of respiratory  c/o's or need for noct saba. Also denies any obvious fluctuation of symptoms with weather or environmental changes or other aggravating or alleviating factors except as outlined above   No unusual exposure hx or h/o childhood pna/ asthma or knowledge of premature birth.  Current Allergies, Complete Past Medical History, Past Surgical History, Family History, and Social History were reviewed in Haley Trevino record.  ROS  The following are not active complaints unless bolded Hoarseness, sore throat, dysphagia, dental problems, itching, sneezing,  nasal congestion or discharge of excess mucus or purulent secretions, ear ache,   fever, chills, sweats, unintended wt loss or wt gain, classically pleuritic or exertional cp,  orthopnea pnd or arm/hand swelling  or leg swelling, presyncope, palpitations, abdominal pain, anorexia, nausea, vomiting, diarrhea  or change in bowel habits or change in bladder habits, change in stools or change in urine, dysuria, hematuria,  rash, arthralgias, visual complaints, headache, numbness, weakness or ataxia or problems with walking or coordination,  change in mood or  memory.        No outpatient medications have been marked as taking for the 10/29/22 encounter (Appointment) with Haley Cowden, MD.            Past  Medical History:  Diagnosis Date   Cataract    Depression    GERD (gastroesophageal reflux disease)    Hyperlipidemia    Hypertension    Hypothyroidism    IBS (irritable bowel syndrome)    Osteoarthritis    Raynaud disease    S/P TAVR (transcatheter aortic valve replacement) 05/20/2022   s/p TAVR with a 23 mm Edwards S3UR via the TF approach by Haley Trevino & Bartle   Scleroderma [facility])    Thyroid disease    hypothyroidism      Objective:    Wt Readings from Last 3 Encounters:  09/02/22 106 lb (48.1 kg)  08/21/22 104 lb 0.9 oz (47.2 kg)  08/18/22 102 lb (46.3 kg)      Vital signs  reviewed  10/29/2022  - Note at rest 02 sats  ***% on ***   General appearance:    ***     2/6 SEM s  increase in P2 ***    Assessment

## 2022-10-29 ENCOUNTER — Ambulatory Visit: Payer: Medicare Other | Admitting: Internal Medicine

## 2022-10-29 ENCOUNTER — Ambulatory Visit: Payer: Medicare Other | Admitting: Cardiology

## 2022-10-30 ENCOUNTER — Inpatient Hospital Stay: Payer: Medicare Other

## 2022-10-31 ENCOUNTER — Emergency Department (HOSPITAL_COMMUNITY): Payer: Medicare Other

## 2022-10-31 ENCOUNTER — Emergency Department (HOSPITAL_COMMUNITY)
Admission: EM | Admit: 2022-10-31 | Discharge: 2022-10-31 | Disposition: A | Discharge status: Home / Self Care | Payer: Medicare Other | Attending: Emergency Medicine | Admitting: Emergency Medicine

## 2022-10-31 ENCOUNTER — Other Ambulatory Visit: Payer: Self-pay

## 2022-10-31 DIAGNOSIS — Z79899 Other long term (current) drug therapy: Secondary | ICD-10-CM | POA: Diagnosis not present

## 2022-10-31 DIAGNOSIS — I10 Essential (primary) hypertension: Secondary | ICD-10-CM | POA: Insufficient documentation

## 2022-10-31 DIAGNOSIS — M79621 Pain in right upper arm: Secondary | ICD-10-CM | POA: Diagnosis not present

## 2022-10-31 DIAGNOSIS — R079 Chest pain, unspecified: Secondary | ICD-10-CM | POA: Insufficient documentation

## 2022-10-31 DIAGNOSIS — R519 Headache, unspecified: Secondary | ICD-10-CM | POA: Diagnosis not present

## 2022-10-31 DIAGNOSIS — Z7982 Long term (current) use of aspirin: Secondary | ICD-10-CM | POA: Diagnosis not present

## 2022-10-31 DIAGNOSIS — M79622 Pain in left upper arm: Secondary | ICD-10-CM | POA: Diagnosis not present

## 2022-10-31 DIAGNOSIS — R0789 Other chest pain: Secondary | ICD-10-CM | POA: Diagnosis not present

## 2022-10-31 DIAGNOSIS — M349 Systemic sclerosis, unspecified: Secondary | ICD-10-CM

## 2022-10-31 LAB — CBC
HCT: 36 % (ref 36.0–46.0)
Hemoglobin: 11.5 g/dL — ABNORMAL LOW (ref 12.0–15.0)
MCH: 29.3 pg (ref 26.0–34.0)
MCHC: 31.9 g/dL (ref 30.0–36.0)
MCV: 91.6 fL (ref 80.0–100.0)
Platelets: 315 10*3/uL (ref 150–400)
RBC: 3.93 MIL/uL (ref 3.87–5.11)
RDW: 13.8 % (ref 11.5–15.5)
WBC: 7.9 10*3/uL (ref 4.0–10.5)
nRBC: 0 % (ref 0.0–0.2)

## 2022-10-31 LAB — BASIC METABOLIC PANEL
Anion gap: 12 (ref 5–15)
BUN: 16 mg/dL (ref 8–23)
CO2: 22 mmol/L (ref 22–32)
Calcium: 9.6 mg/dL (ref 8.9–10.3)
Chloride: 104 mmol/L (ref 98–111)
Creatinine, Ser: 0.98 mg/dL (ref 0.44–1.00)
GFR, Estimated: 58 mL/min — ABNORMAL LOW (ref >60)
Glucose, Bld: 107 mg/dL — ABNORMAL HIGH (ref 70–99)
Potassium: 4.6 mmol/L (ref 3.5–5.1)
Sodium: 138 mmol/L (ref 135–145)

## 2022-10-31 LAB — TROPONIN I (HIGH SENSITIVITY)
Troponin I (High Sensitivity): 15 ng/L (ref <18)
Troponin I (High Sensitivity): 14 ng/L (ref <18)

## 2022-10-31 LAB — CK: Total CK: 59 U/L (ref 38–234)

## 2022-10-31 MED ORDER — PREDNISONE 10 MG PO TABS: 20.0000 mg | ORAL_TABLET | Freq: Every day | ORAL | 0 refills | Status: DC

## 2022-10-31 MED ORDER — HYDROCODONE-ACETAMINOPHEN 5-325 MG PO TABS
1.0000 | ORAL_TABLET | Freq: Once | ORAL | Status: AC
  Administered 2022-10-31: 1 via ORAL
  Filled 2022-10-31: qty 1

## 2022-10-31 MED ORDER — FENTANYL CITRATE PF 50 MCG/ML IJ SOSY
25.0000 ug | PREFILLED_SYRINGE | Freq: Once | INTRAMUSCULAR | Status: DC
  Filled 2022-10-31: qty 1

## 2022-10-31 MED ORDER — HYDROCODONE-ACETAMINOPHEN 5-325 MG PO TABS: ORAL_TABLET | ORAL | 0 refills | Status: DC

## 2022-10-31 NOTE — ED Provider Notes (Signed)
[employer] EMERGENCY DEPARTMENT AT [facility] Provider Note   CSN: [id number] Arrival date & time: 10/31/22  1345     History  Chief Complaint  Patient presents with   Arm Pain   Chest Lump    Haley Trevino is a 82 y.o. female.  HPI 82 year old female presents with multiple complaints.  She is complaining of a headache for the past several weeks.  She has multiple comorbidities which include [name], scleroderma, hypertension, aortic valve replacement in November 2023 and chronic anemia.  She states that she has been having bilateral upper arm pain that is atraumatic.  Feels like a sharp, constant pain.  She also has chronic chest pain since she had a pacemaker put in.  No new dyspnea though chronically wears oxygen.  She has been having headaches for a few weeks and these were exacerbated when she was put on gabapentin to try and treat her arm pain by her rheumatologist.  Her PCP recommended she stop the gabapentin which she did.  However she still having severe pain.  The headaches are also worsening and are left-sided.  No acute weakness or numbness.  Home Medications Prior to Admission medications   Medication Sig Start Date End Date Taking? Authorizing Provider  acetaminophen (TYLENOL) 500 MG tablet Take 1,000 mg by mouth at bedtime.    [provider]  aspirin EC 81 MG tablet Take 81 mg by mouth daily. Swallow whole.    [provider]  CALCIUM PO Take 600 mg by mouth daily. Patient not taking: Reported on 10/27/2022    [provider]  Cholecalciferol (VITAMIN D) 50 MCG (2000 UT) tablet Take 2,000 Units by mouth daily. Patient not taking: Reported on 10/27/2022    [provider]  clonazePAM (KLONOPIN) 0.5 MG tablet Take 1 tablet (0.5 mg total) by mouth 2 (two) times daily as needed for anxiety. 08/18/22   Daphine Deutscher, Mary-Margaret, FNP  ezetimibe (ZETIA) 10 MG tablet Take 1 tablet (10 mg total) by mouth daily. 08/18/22    Daphine Deutscher, Mary-Margaret, FNP  FLUoxetine (PROZAC) 40 MG capsule Take 1 capsule (40 mg total) by mouth daily. 08/18/22   Daphine Deutscher, Mary-Margaret, FNP  fluticasone (FLONASE) 50 MCG/ACT nasal spray PLACE 2 SPRAYS INTO BOTH NOSTRILS DAILY Patient taking differently: Place 2 sprays into both nostrils daily as needed for allergies. 01/03/22   Daphine Deutscher, Mary-Margaret, FNP  furosemide (LASIX) 20 MG tablet Take 1 tablet (20 mg total) by mouth daily. 03/28/22 03/28/23  Orbie Pyo, MD  gabapentin (NEURONTIN) 100 MG capsule Take 100 mg by mouth at bedtime. 10/21/22   [provider]  Multiple Vitamins-Minerals (PRESERVISION AREDS 2+MULTI VIT PO) Take 1 capsule by mouth in the morning and at bedtime. Patient not taking: Reported on 10/27/2022    [provider]  NIFEdipine (PROCARDIA XL/NIFEDICAL XL) 60 MG 24 hr tablet Take 1 tablet (60 mg total) by mouth daily. 08/18/22   Bennie Pierini, FNP  omeprazole (PRILOSEC) 20 MG capsule TAKE 1 TABLET DAILY 08/18/22   Daphine Deutscher, Mary-Margaret, FNP  PROLIA 60 MG/ML SOSY injection Inject 60 mg into the skin every 6 (six) months. 04/21/22   Daphine Deutscher, Mary-Margaret, FNP  RESTASIS 0.05 % ophthalmic emulsion Place 1 drop into both eyes 2 (two) times daily. 03/26/22   [provider]  SYNTHROID 75 MCG tablet TAKE (1) TABLET DAILY BE- FORE BREAKFAST. 08/18/22   Daphine Deutscher Mary-Margaret, FNP      Allergies    Bactrim [sulfamethoxazole-trimethoprim], Dilaudid [hydromorphone hcl], Dilaudid [hydromorphone],  Acyclovir and related, Crestor [rosuvastatin calcium], Eliquis [apixaban], Gabapentin, Aleve [naproxen sodium], and Lyrica [pregabalin]    Review of Systems   Review of Systems  Respiratory:  Positive for shortness of breath (chronic).   Cardiovascular:  Positive for chest pain (chronic).  Musculoskeletal:  Positive for myalgias.  Neurological:  Positive for headaches. Negative for weakness and numbness.    Physical Exam Updated Vital Signs BP 120/82  (BP Location: Right Arm)   Pulse 74   Temp 98.3 F (36.8 C) (Oral)   Resp (!) 22   SpO2 100%  Physical Exam Vitals and nursing note reviewed.  Constitutional:      Appearance: She is well-developed.     Comments: Chronically ill appearing  HENT:     Head: Normocephalic and atraumatic.  Eyes:     Extraocular Movements: Extraocular movements intact.     Pupils: Pupils are equal, round, and reactive to light.  Cardiovascular:     Rate and Rhythm: Normal rate and regular rhythm.     Pulses:          Radial pulses are 2+ on the right side and 2+ on the left side.     Heart sounds: Murmur heard.  Pulmonary:     Effort: Pulmonary effort is normal.  Abdominal:     Palpations: Abdomen is soft.     Tenderness: There is no abdominal tenderness.  Musculoskeletal:     Comments: Bilateral upper arms around her humerus are tender but not swollen and there is no deformity.  I am able to range her shoulders without any significant discomfort.  Skin:    General: Skin is warm and dry.  Neurological:     Mental Status: She is alert.     Comments: CN 3-12 grossly intact. 5/5 strength in all 4 extremities. Grossly normal sensation. Normal finger to nose.      ED Results / Procedures / Treatments   Labs (all labs ordered are listed, but only abnormal results are displayed) Labs Reviewed  BASIC METABOLIC PANEL - Abnormal; Notable for the following components:      Result Value   Glucose, Bld 107 (*)    GFR, Estimated 58 (*)    All other components within normal limits  CBC  CK  TROPONIN I (HIGH SENSITIVITY)    EKG EKG Interpretation  Date/Time:  Friday October 31 2022 14:27:05 EDT Ventricular Rate:  78 PR Interval:  158 QRS Duration: 76 QT Interval:  400 QTC Calculation: 456 R Axis:   72 Text Interpretation: Normal sinus rhythm no acute ST/T changes nonspecific ST changes improved when compared to Nov 2023 Confirmed by Pricilla Loveless [phone]) on 10/31/2022 2:50:02 PM  Radiology DG  Chest 2 View  Result Date: 10/31/2022 CLINICAL DATA:  Chest pain EXAM: CHEST - 2 VIEW COMPARISON:  X-ray 07/22/2022 and older FINDINGS: Status post TAVR. Stable cardiopericardial silhouette with calcified aorta. Left upper chest pacemaker. Hyperinflation. No consolidation, pneumothorax or effusion. No edema. Osteopenia. Augmentation cement along compressed vertebral bodies of the midthoracic spine, unchanged from previous. IMPRESSION: Hyperinflation.  No acute cardiopulmonary disease. Status post TAVR.  Pacemaker. Osteopenia with compression deformities along the spine with augmentation cement Electronically Signed   By: Karen Kays M.D.   On: 10/31/2022 14:49    Procedures Procedures    Medications Ordered in ED Medications  fentaNYL (SUBLIMAZE) injection 25 mcg (has no administration in time range)    ED Course/ Medical Decision Making/ A&P  Medical Decision Making Amount and/or Complexity of Data Reviewed Labs: ordered.    Details: Troponin not consistent with ACS.  No significant electrolyte disturbance.  CBC pending. Radiology: ordered and independent interpretation performed.    Details: No CHF ECG/medicine tests: ordered and independent interpretation performed.    Details: No acute ischemia  Risk Prescription drug management.   Patient presents with multiple complaints.  Given progressively worsening headache will get CT head.  She has chronic chest pain as I think ACS is pretty unlikely, especially with a negative troponin.  She has chronic anemia as well and has felt generally weak for months, CBC is currently pending as lab reports they had to put in the incubator.  Otherwise we will get some screening x-rays but her arm pain is probably more neuropathy/related to her scleroderma/rheumatologic issues.  At this point, workup is still pending, will give a small course of pain control with fentanyl, and care will be transferred to Dr.  Estell Harpin.        Final Clinical Impression(s) / ED Diagnoses Final diagnoses:  None    Rx / DC Orders ED Discharge Orders     None         Pricilla Loveless, MD 10/31/22 [phone]

## 2022-10-31 NOTE — ED Notes (Signed)
AVS reviewed with pt and family prior to discharge. Pt verbalizes understanding of teaching. Belongings with pt upon depart. Pt taken to POV via wheelchair with family driving home. VSS.

## 2022-10-31 NOTE — Discharge Instructions (Signed)
Follow-up with your rheumatologist in the next couple weeks

## 2022-10-31 NOTE — ED Triage Notes (Signed)
Pt came in via POV d/t her Rt arm starting to hurt this last week & it concerns her since she does have cardiac Hx. Pt also has a pacemaker & had a valve replaced & when her breast implants was removed she feels  lump in her Lt chest is there after they were removed. A/Ox4, rates pain 8/10 while in triage.

## 2022-11-04 ENCOUNTER — Encounter: Payer: Self-pay | Admitting: *Deleted

## 2022-11-05 ENCOUNTER — Telehealth: Payer: Self-pay | Admitting: *Deleted

## 2022-11-05 NOTE — Telephone Encounter (Signed)
Transition Care Management Unsuccessful Follow-up Telephone Call  Date of discharge and from where:  [facility]   10/31/2022  Attempts:  1st Attempt  Reason for unsuccessful TCM follow-up call:  Left voice message

## 2022-11-06 ENCOUNTER — Telehealth: Payer: Self-pay

## 2022-11-06 ENCOUNTER — Inpatient Hospital Stay: Payer: Medicare Other | Attending: Hematology

## 2022-11-06 DIAGNOSIS — D509 Iron deficiency anemia, unspecified: Secondary | ICD-10-CM | POA: Insufficient documentation

## 2022-11-06 NOTE — Telephone Encounter (Signed)
Transition Care Management Unsuccessful Follow-up Telephone Call  Date of discharge and from where:  [facility]   Attempts:  2nd Attempt  Reason for unsuccessful TCM follow-up call:  Left voice message   Lenard Forth [facility] Guide, [facility] [phone] [address] [place], [place], [place] [address] Phone: [phone] Email: [email or web address]

## 2022-11-09 NOTE — Progress Notes (Unsigned)
Cardiology Office Note:   Date:  11/12/2022  ID:  Haley Trevino, DOB 13-Mar-1941, MRN [id number]  History of Present Illness:   Haley Trevino is a 82 y.o. female who is referred   as an outpatient by the Structural Heart Team as echocardiogram in 03/2022 showed EF >75%, mod concentric LVH, and severe AS with a mean grad 48 mmHg, peak grad 76.9 mmHg, AVA 1.01 cm2, DVI 0.36, SVI 78, as well as severe MAC with moderate MS. Underwent L/RHC in 03/2022 which showed minimal obstructive coronary artery disease. . She underwent successful TAVR on 05/20/2022 with a 23 mm Edwards Sapien 3 Ultra Resilia THV via the TF approach. Post operative echo on 05/21/22 showed EF 60% and normally functioning TAVR with a mean gradient 11.5 and trivial AR. She then developed recurrent respiratory failure requiring IV diuresis as well as CHB and underwent placement of an Assurity MRI PPM on 05/27/2022 by Dr. Lalla Brothers. Additionally, she developed new onset aflutter and was started on Eliquis  She developed melanotic stools and Hg dropped to 6.6. She was transfused 2UPRBCs and Eliquis was discontinued and melena cleared.  The last echo in December demonstrated stable valve function.  She was in the emergency room in April with headache.  She thinks this was related to gabapentin.  Head CT was unremarkable.  She also had diffuse muscle aches and chest pains but there was no suggestion of any acute coronary event.  Troponins were negative.  I did review these records for this visit.  Unfortunately she has lots of pain in her legs and right side of her chest and shoulders and arms that she relates to her scleroderma.  She has had some chronic shortness of breath.  I saw some pulmonary function test that demonstrated no overt abnormalities but her rheumatologist is wanting to send her for possible evaluation for pulmonary hypertension.  There is no evidence of this when she had her right heart cath prior to her procedure.  She says  she gets dyspneic though walking across the house.  She does not however report any oxygen desaturations and she does measure this.  ROS: As stated in the HPI and negative for all other systems.  Studies Reviewed:    EKG:  NA  10/31/2022 EKG sinus rhythm, rate 78, axis within normal limits, intervals within normal limits, no acute ST-T wave changes.  Risk Assessment/Calculations:              Physical Exam:   VS:  BP 120/64   Pulse 88   Ht 5\' 2"  (1.575 m)   Wt 108 lb (49 kg)   BMI 19.75 kg/m    Wt Readings from Last 3 Encounters:  11/12/22 108 lb (49 kg)  09/02/22 106 lb (48.1 kg)  08/21/22 104 lb 0.9 oz (47.2 kg)     GEN: Well nourished, well developed in no acute distress NECK: No JVD; No carotid bruits CARDIAC: RRR, very brief apical systolic murmur radiating slightly to the carotids murmurs, rubs, gallops RESPIRATORY:  Clear to auscultation without rales, wheezing or rhonchi  ABDOMEN: Soft, non-tender, non-distended EXTREMITIES:  No edema; No deformity   ASSESSMENT AND PLAN:   Chest pain: She has had musculoskeletal nonanginal pain.  She had no struct of coronary disease.  No further workup this is probably related to her scleroderma.   I did review the ER records as above.   TAVR:   She is status post TAVR.   She has follow-up echo in  November and follow-up with the Structural Heart Team  HTN: The blood pressure is controlled.  No change in therapy.  CHB:  She is status post pacemaker and up to date with the follow up.    Atrial flutter: She has had no symptomatic recurrence of this.  GI bleed: Her last hemoglobin was 11.5 but she says she is about to start iron infusions.        Signed, Rollene Rotunda, MD

## 2022-11-12 ENCOUNTER — Encounter: Payer: Self-pay | Admitting: Cardiology

## 2022-11-12 ENCOUNTER — Ambulatory Visit (INDEPENDENT_AMBULATORY_CARE_PROVIDER_SITE_OTHER): Payer: Medicare Other | Admitting: Cardiology

## 2022-11-12 VITALS — BP 120/64 | HR 88 | Ht 62.0 in | Wt 108.0 lb

## 2022-11-12 DIAGNOSIS — I10 Essential (primary) hypertension: Secondary | ICD-10-CM | POA: Diagnosis not present

## 2022-11-12 DIAGNOSIS — Z952 Presence of prosthetic heart valve: Secondary | ICD-10-CM

## 2022-11-12 DIAGNOSIS — I484 Atypical atrial flutter: Secondary | ICD-10-CM | POA: Diagnosis not present

## 2022-11-12 DIAGNOSIS — R072 Precordial pain: Secondary | ICD-10-CM

## 2022-11-12 DIAGNOSIS — I442 Atrioventricular block, complete: Secondary | ICD-10-CM

## 2022-11-12 NOTE — Patient Instructions (Signed)
Medication Instructions:  The current medical regimen is effective;  continue present plan and medications.  *If you need a refill on your cardiac medications before your next appointment, please call your pharmacy*  Follow-Up: At [facility], you and your health needs are our priority.  As part of our continuing mission to provide you with exceptional heart care, we have created designated Provider Care Teams.  These Care Teams include your primary Cardiologist (physician) and Advanced Practice Providers (APPs -  Physician Assistants and Nurse Practitioners) who all work together to provide you with the care you need, when you need it.  We recommend signing up for the patient portal called "MyChart".  Sign up information is provided on this After Visit Summary.  MyChart is used to connect with patients for Virtual Visits (Telemedicine).  Patients are able to view lab/test results, encounter notes, upcoming appointments, etc.  Non-urgent messages can be sent to your provider as well.   To learn more about what you can do with MyChart, go to [email or web address].    Your next appointment:   1 year(s)  Provider:   James Hochrein, MD     

## 2022-11-13 ENCOUNTER — Encounter: Payer: Self-pay | Admitting: Internal Medicine

## 2022-11-14 ENCOUNTER — Ambulatory Visit: Payer: Medicare Other | Admitting: Nurse Practitioner

## 2022-11-14 ENCOUNTER — Inpatient Hospital Stay: Payer: Medicare Other

## 2022-11-14 VITALS — BP 111/43 | HR 75 | Temp 98.2°F | Resp 18

## 2022-11-14 DIAGNOSIS — D500 Iron deficiency anemia secondary to blood loss (chronic): Secondary | ICD-10-CM

## 2022-11-14 DIAGNOSIS — D509 Iron deficiency anemia, unspecified: Secondary | ICD-10-CM | POA: Diagnosis not present

## 2022-11-14 MED ORDER — ACETAMINOPHEN 325 MG PO TABS: 650.0000 mg | ORAL_TABLET | Freq: Once | ORAL | Status: DC

## 2022-11-14 MED ORDER — SODIUM CHLORIDE 0.9 % IV SOLN: Freq: Once | INTRAVENOUS | Status: AC

## 2022-11-14 MED ORDER — CETIRIZINE HCL 10 MG PO TABS
10.0000 mg | ORAL_TABLET | Freq: Once | ORAL | Status: AC
  Administered 2022-11-14: 10 mg via ORAL
  Filled 2022-11-14: qty 1

## 2022-11-14 MED ORDER — SODIUM CHLORIDE 0.9 % IV SOLN
300.0000 mg | Freq: Once | INTRAVENOUS | Status: AC
  Administered 2022-11-14: 300 mg via INTRAVENOUS
  Filled 2022-11-14: qty 300

## 2022-11-14 NOTE — Patient Instructions (Signed)
[facility]  Discharge Instructions: Thank you for choosing [facility] to provide your oncology and hematology care.  If you have a lab appointment with the Cancer Center - please note that after April 8th, 2024, all labs will be drawn in the cancer center.  You do not have to check in or register with the main entrance as you have in the past but will complete your check-in in the cancer center.  Wear comfortable clothing and clothing appropriate for easy access to any Portacath or PICC line.   We strive to give you quality time with your provider. You may need to reschedule your appointment if you arrive late (15 or more minutes).  Arriving late affects you and other patients whose appointments are after yours.  Also, if you miss three or more appointments without notifying the office, you may be dismissed from the clinic at the provider's discretion.      For prescription refill requests, have your pharmacy contact our office and allow 72 hours for refills to be completed.    Today you received the following chemotherapy and/or immunotherapy agents Venofer . Iron Sucrose Injection What is this medication? IRON SUCROSE (EYE ern SOO krose) treats low levels of iron (iron deficiency anemia) in people with kidney disease. Iron is a mineral that plays an important role in making red blood cells, which carry oxygen from your lungs to the rest of your body. This medicine may be used for other purposes; ask your health care provider or pharmacist if you have questions. COMMON BRAND NAME(S): Venofer What should I tell my care team before I take this medication? They need to know if you have any of these conditions: Anemia not caused by low iron levels Heart disease High levels of iron in the blood Kidney disease Liver disease An unusual or allergic reaction to iron, other medications, foods, dyes, or preservatives Pregnant or trying to get  pregnant Breastfeeding How should I use this medication? This medication is for infusion into a vein. It is given in a hospital or clinic setting. Talk to your care team about the use of this medication in children. While this medication may be prescribed for children as young as 2 years for selected conditions, precautions do apply. Overdosage: If you think you have taken too much of this medicine contact a poison control center or emergency room at once. NOTE: This medicine is only for you. Do not share this medicine with others. What if I miss a dose? Keep appointments for follow-up doses. It is important not to miss your dose. Call your care team if you are unable to keep an appointment. What may interact with this medication? Do not take this medication with any of the following: Deferoxamine Dimercaprol Other iron products This medication may also interact with the following: Chloramphenicol Deferasirox This list may not describe all possible interactions. Give your health care provider a list of all the medicines, herbs, non-prescription drugs, or dietary supplements you use. Also tell them if you smoke, drink alcohol, or use illegal drugs. Some items may interact with your medicine. What should I watch for while using this medication? Visit your care team regularly. Tell your care team if your symptoms do not start to get better or if they get worse. You may need blood work done while you are taking this medication. You may need to follow a special diet. Talk to your care team. Foods that contain iron include: whole grains/cereals,   dried fruits, beans, or peas, leafy green vegetables, and organ meats (liver, kidney). What side effects may I notice from receiving this medication? Side effects that you should report to your care team as soon as possible: Allergic reactions--skin rash, itching, hives, swelling of the face, lips, tongue, or throat Low blood pressure--dizziness, feeling  faint or lightheaded, blurry vision Shortness of breath Side effects that usually do not require medical attention (report to your care team if they continue or are bothersome): Flushing Headache Joint pain Muscle pain Nausea Pain, redness, or irritation at injection site This list may not describe all possible side effects. Call your doctor for medical advice about side effects. You may report side effects to FDA at 1-800-FDA-1088. Where should I keep my medication? This medication is given in a hospital or clinic and will not be stored at home. NOTE: This sheet is a summary. It may not cover all possible information. If you have questions about this medicine, talk to your doctor, pharmacist, or health care provider.  2023 Elsevier/Gold Standard (2020-10-04 00:00:00)       To help prevent nausea and vomiting after your treatment, we encourage you to take your nausea medication as directed.  BELOW ARE SYMPTOMS THAT SHOULD BE REPORTED IMMEDIATELY: *FEVER GREATER THAN 100.4 F (38 C) OR HIGHER *CHILLS OR SWEATING *NAUSEA AND VOMITING THAT IS NOT CONTROLLED WITH YOUR NAUSEA MEDICATION *UNUSUAL SHORTNESS OF BREATH *UNUSUAL BRUISING OR BLEEDING *URINARY PROBLEMS (pain or burning when urinating, or frequent urination) *BOWEL PROBLEMS (unusual diarrhea, constipation, pain near the anus) TENDERNESS IN MOUTH AND THROAT WITH OR WITHOUT PRESENCE OF ULCERS (sore throat, sores in mouth, or a toothache) UNUSUAL RASH, SWELLING OR PAIN  UNUSUAL VAGINAL DISCHARGE OR ITCHING   Items with * indicate a potential emergency and should be followed up as soon as possible or go to the Emergency Department if any problems should occur.  Please show the CHEMOTHERAPY ALERT CARD or IMMUNOTHERAPY ALERT CARD at check-in to the Emergency Department and triage nurse.  Should you have questions after your visit or need to cancel or reschedule your appointment, please contact [facility]  [phone]  and follow the prompts.  Office hours are 8:00 a.m. to 4:30 p.m. Monday - Friday. Please note that voicemails left after 4:00 p.m. may not be returned until the following business day.  We are closed weekends and major holidays. You have access to a nurse at all times for urgent questions. Please call the main number to the clinic [phone] and follow the prompts.  For any non-urgent questions, you may also contact your provider using MyChart. We now offer e-Visits for anyone 18 and older to request care online for non-urgent symptoms. For details visit [email or web address].   Also download the MyChart app! Go to the app store, search "MyChart", open the app, select [facility], and log in with your MyChart username and password.   

## 2022-11-14 NOTE — Progress Notes (Signed)
Patient presents today for Venofer 300 mg iron infusion. Vital signs are stable . Patient has complaints of fatigue and chronic pain in her back and left and right arms. Patient took Tylenol 650 mg po prior to arrival. 10 mg po Zyrtec given x 1 dose now.   Venofer given today per MD orders. Tolerated infusion without adverse affects. Vital signs stable. No complaints at this time. [name] Alert and oriented x 3. F/U with [facility] as scheduled.

## 2022-11-21 ENCOUNTER — Telehealth: Payer: Self-pay | Admitting: Internal Medicine

## 2022-11-21 ENCOUNTER — Inpatient Hospital Stay: Payer: Medicare Other

## 2022-11-21 VITALS — BP 131/41 | HR 71 | Temp 97.5°F | Resp 16

## 2022-11-21 DIAGNOSIS — D509 Iron deficiency anemia, unspecified: Secondary | ICD-10-CM | POA: Diagnosis not present

## 2022-11-21 DIAGNOSIS — D500 Iron deficiency anemia secondary to blood loss (chronic): Secondary | ICD-10-CM

## 2022-11-21 MED ORDER — SODIUM CHLORIDE 0.9 % IV SOLN
300.0000 mg | Freq: Once | INTRAVENOUS | Status: AC
  Administered 2022-11-21: 300 mg via INTRAVENOUS
  Filled 2022-11-21: qty 300

## 2022-11-21 MED ORDER — SODIUM CHLORIDE 0.9 % IV SOLN: Freq: Once | INTRAVENOUS | Status: AC

## 2022-11-21 NOTE — Telephone Encounter (Signed)
Haley Trevino states needs orders for oxygen. Needs office notes for [facility] 12/12/2022. Haley Trevino phone number is [phone].

## 2022-11-21 NOTE — Progress Notes (Signed)
Patient presents today for IV venofer, patient reports taking premedications at home. Patient tolerated iron infusion with no complaints voiced. Peripheral IV site clean and dry with good blood return noted before and after infusion. Band aid applied. VSS with discharge and left in satisfactory condition with no s/s of distress noted.

## 2022-11-21 NOTE — Patient Instructions (Signed)
[facility]  Discharge Instructions: Thank you for choosing [facility] to provide your oncology and hematology care.  If you have a lab appointment with the Cancer Center - please note that after April 8th, 2024, all labs will be drawn in the cancer center.  You do not have to check in or register with the main entrance as you have in the past but will complete your check-in in the cancer center.  Wear comfortable clothing and clothing appropriate for easy access to any Portacath or PICC line.   We strive to give you quality time with your provider. You may need to reschedule your appointment if you arrive late (15 or more minutes).  Arriving late affects you and other patients whose appointments are after yours.  Also, if you miss three or more appointments without notifying the office, you may be dismissed from the clinic at the provider's discretion.      For prescription refill requests, have your pharmacy contact our office and allow 72 hours for refills to be completed.    Today you received the following Venofer, return as scheduled.   To help prevent nausea and vomiting after your treatment, we encourage you to take your nausea medication as directed.  BELOW ARE SYMPTOMS THAT SHOULD BE REPORTED IMMEDIATELY: *FEVER GREATER THAN 100.4 F (38 C) OR HIGHER *CHILLS OR SWEATING *NAUSEA AND VOMITING THAT IS NOT CONTROLLED WITH YOUR NAUSEA MEDICATION *UNUSUAL SHORTNESS OF BREATH *UNUSUAL BRUISING OR BLEEDING *URINARY PROBLEMS (pain or burning when urinating, or frequent urination) *BOWEL PROBLEMS (unusual diarrhea, constipation, pain near the anus) TENDERNESS IN MOUTH AND THROAT WITH OR WITHOUT PRESENCE OF ULCERS (sore throat, sores in mouth, or a toothache) UNUSUAL RASH, SWELLING OR PAIN  UNUSUAL VAGINAL DISCHARGE OR ITCHING   Items with * indicate a potential emergency and should be followed up as soon as possible or go to the Emergency Department if  any problems should occur.  Please show the CHEMOTHERAPY ALERT CARD or IMMUNOTHERAPY ALERT CARD at check-in to the Emergency Department and triage nurse.  Should you have questions after your visit or need to cancel or reschedule your appointment, please contact [facility] [phone]  and follow the prompts.  Office hours are 8:00 a.m. to 4:30 p.m. Monday - Friday. Please note that voicemails left after 4:00 p.m. may not be returned until the following business day.  We are closed weekends and major holidays. You have access to a nurse at all times for urgent questions. Please call the main number to the clinic [phone] and follow the prompts.  For any non-urgent questions, you may also contact your provider using MyChart. We now offer e-Visits for anyone 18 and older to request care online for non-urgent symptoms. For details visit [email or web address].   Also download the MyChart app! Go to the app store, search "MyChart", open the app, select [facility], and log in with your MyChart username and password.   

## 2022-11-21 NOTE — Telephone Encounter (Signed)
Called and spoke w/ Haley Trevino pt was d/c w/ oxygen and hospital provider did not sign the order pt needs to be re-qualified for oxygen during next OV.  Called and spoke w/ pt offered her soon appt on 5/20  @ 10:15 as a HFU.   Dr.Wert routing to you as Lorain Childes

## 2022-11-23 NOTE — Progress Notes (Unsigned)
Haley Trevino, female    DOB: 10-26-1940    MRN: [id number]   Brief patient profile:  86  yowf  never smoker  referred to pulmonary clinic in [place]  07/22/2022 by Cline Crock  for doe ? ILD    [place] in 2014 > [name] previously better on sulindafil   Admit date: 05/13/2022 Discharge date: 05/29/2022   Primary Care Provider: Bennie Pierini, FNP  Primary Cardiologist:  Rollene Rotunda, MD Structural Heart: Dr. Lynnette Caffey & Dr. Laneta Simmers (TAVR)       Discharge Diagnoses    Principal Problem:   S/P TAVR (transcatheter aortic valve replacement)   [name]   Hypertension   Hyperlipidemia with target LDL less than 100   Nonrheumatic aortic valve stenosis   Acute on chronic diastolic heart failure (HCC)   Acute respiratory distress   Anxiety   [name]   Hyperkalemia   Heart block AV complete (HCC)    History of Present Illness  07/22/2022  Pulmonary/ 1st office eval/ Haley Trevino / [place] [facility]  Chief Complaint  Patient presents with   Consult    Was told something may be wrong with her lungs  Dyspnea:  x  steps despite 02/  no ex program  Cough: minimal / occ choking since in hospital  Sleep: flat bed one pillow under head  SABA use:  02: 2.5  lpm  CP Ant left side  24/7  x years skin is sensitive w/in a year of L  breast surgery Rec Make sure you check your oxygen saturation  AT  your highest level of activity (not after you stop)   to be sure it stays over 90%     ER eval  10/31/22 for HA/sob/cp/ no change cxr     11/24/2022  post ER f/u ov/[place] office/Haley Trevino re: doe with nl pfts 10/13/22  maint on omeprazole ac qd   Chief Complaint  Patient presents with   Follow-up    Recent flare of her scleroderma- she sees Dr Dierdre Forth in a couple weeks.   Dyspnea:  no problem with steps / not stopping at top /not checking sats at all  Cough: none  Sleeping: flat bed one pillow no resp cc  SABA use: none  02: not using x  2  weeks      No obvious day to day or daytime variability or assoc excess/ purulent sputum or mucus plugs or hemoptysis or cp or chest tightness, subjective wheeze or overt sinus or hb symptoms.   Sleeping  without nocturnal  or early am exacerbation  of respiratory  c/o's or need for noct saba. Also denies any obvious fluctuation of symptoms with weather or environmental changes or other aggravating or alleviating factors except as outlined above   No unusual exposure hx or h/o childhood pna/ asthma or knowledge of premature birth.  Current Allergies, Complete Past Medical History, Past Surgical History, Family History, and Social History were reviewed in [employer] record.  ROS  The following are not active complaints unless bolded Hoarseness, sore throat, dysphagia, dental problems, itching, sneezing,  nasal congestion or discharge of excess mucus or purulent secretions, ear ache,   fever, chills, sweats, unintended wt loss or wt gain, classically pleuritic or exertional cp,  orthopnea pnd or arm/hand swelling  or leg swelling, presyncope, palpitations, abdominal pain, anorexia, nausea, vomiting, diarrhea  or change in bowel habits or change in bladder habits, change in stools or change in urine, dysuria, hematuria,  rash, arthralgias, visual complaints, headache, numbness, weakness or ataxia or problems with walking or coordination,  change in mood or  memory.        Current Meds  Medication Sig   acetaminophen (TYLENOL) 500 MG tablet Take 1,000 mg by mouth at bedtime.   aspirin EC 81 MG tablet Take 81 mg by mouth daily. Swallow whole.   clonazePAM (KLONOPIN) 0.5 MG tablet Take 1 tablet (0.5 mg total) by mouth 2 (two) times daily as needed for anxiety.   ezetimibe (ZETIA) 10 MG tablet Take 1 tablet (10 mg total) by mouth daily.   FLUoxetine (PROZAC) 40 MG capsule Take 1 capsule (40 mg total) by mouth daily.   fluticasone (FLONASE) 50 MCG/ACT nasal spray PLACE 2 SPRAYS  INTO BOTH NOSTRILS DAILY (Patient taking differently: Place 2 sprays into both nostrils daily as needed for allergies.)   furosemide (LASIX) 20 MG tablet Take 1 tablet (20 mg total) by mouth daily. (Patient taking differently: Take 20 mg by mouth daily as needed.)   NIFEdipine (PROCARDIA XL/NIFEDICAL XL) 60 MG 24 hr tablet Take 1 tablet (60 mg total) by mouth daily. (Patient taking differently: Take 30 mg by mouth daily. Take 1/2 tablet by mouth daily)   omeprazole (PRILOSEC) 20 MG capsule TAKE 1 TABLET DAILY   PROLIA 60 MG/ML SOSY injection Inject 60 mg into the skin every 6 (six) months.   RESTASIS 0.05 % ophthalmic emulsion Place 1 drop into both eyes 2 (two) times daily.   SYNTHROID 75 MCG tablet TAKE (1) TABLET DAILY BE- FORE BREAKFAST.            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 Lynnette Caffey & Bartle   Scleroderma [facility])    Thyroid disease    hypothyroidism      Objective:    Wt Readings from Last 3 Encounters:  11/24/22 106 lb 12.8 oz (48.4 kg)  11/12/22 108 lb (49 kg)  09/02/22 106 lb (48.1 kg)      Vital signs reviewed  11/24/2022  - Note at rest 02 sats  100% on RA   General appearance:    pleasant amb edlerly wf nad        HEENT : Oropharynx  clear      Nasal turbinates nl    NECK :  without  apparent JVD/ palpable Nodes/TM    LUNGS: no acc muscle use,  Nl contour chest which is clear to A and P bilaterally without cough on insp or exp maneuvers   CV:  RRR  no s3  2/6 SEM s  increase in P2, and no edema   ABD:  soft and nontende   MS:  Nl gait/ ext warm with classic sclerodactyly s obvious joint restrictions  calf tenderness, cyanosis or  clubbing    SKIN: warm and dry without lesions    NEURO:  alert, approp, nl sensorium with  no motor or cerebellar deficits apparent       I personally reviewed images and agree with radiology impression as follows:  CXR:   pa and lateral 10/31/22 Hyperinflation.  No acute cardiopulmonary  disease. Status post TAVR.  Pacemaker. Osteopenia with compression deformities along the spine withaugmentation cement Assessment

## 2022-11-24 ENCOUNTER — Encounter: Payer: Self-pay | Admitting: Internal Medicine

## 2022-11-24 ENCOUNTER — Ambulatory Visit (INDEPENDENT_AMBULATORY_CARE_PROVIDER_SITE_OTHER): Payer: Medicare Other | Admitting: Internal Medicine

## 2022-11-24 VITALS — BP 130/64 | HR 66 | Ht 62.0 in | Wt 106.8 lb

## 2022-11-24 DIAGNOSIS — R0609 Other forms of dyspnea: Secondary | ICD-10-CM

## 2022-11-24 NOTE — Patient Instructions (Addendum)
Make sure you check your oxygen saturation at your highest level of activity (NOT after you stop)  to be sure it stays over 90% and keep track of it at least once a week, more often if breathing getting worse, and let me know if losing ground. (Collect the dots to connect the dots approach)    Stop all 02 as of today  Pulmonary follow up is as needed

## 2022-11-24 NOTE — Assessment & Plan Note (Addendum)
S/p TAVR for severe AS 05/2022 > much improved despite setting longstanding raynaud's on sildenafil  - Echo 06/20/22  1. LV EF 60 to 65%.   Left ventricular diastolic parameters are  consistent with Grade II  diastolic dysfunction  Elevated  LVEP  2. Right ventricular systolic function is normal. The right ventricular  size is normal. There is mildly elevated PAS   3. Left atrial size was severely dilated.   4. The mitral valve is degenerative. Trivial mitral valve regurgitation.  Mild to moderate mitral stenosis. The mean mitral valve gradient is 6.0  - 07/22/2022   Walked on RA  x  3  lap(s) =  approx 450  ft  @ mod/fast pace, stopped due to end of study  with lowest 02 sats 94%  mild sob on last lap  - PFT's  10/13/22  FEV1 1.55 (90 % ) ratio 0.75  p 0 % improvement from saba p 0 prior to study with DLCO  8.90 (50%)   and FV curve nl (not corrected for Hgb)  - 11/24/2022   Walked on RA  x  3  lap(s) =  approx 450  ft  @ mod  pace, stopped due to end study with lowest 02 sats 96% s sob > d/c all 02  O evidence or PAH or ILD at this point - best way to monitor = Make sure you check your oxygen saturation at your highest level of activity(NOT after you stop)  to be sure it stays over 90% and keep track of it at least once a week, more often if breathing getting worse, and let me know if losing ground. (Collect the dots to connect the dots approach)    At this point no need for 02 or regular pulmonary f/u but if worse ILD over time should be referred directly to PF clinic in [place]  Each maintenance medication was reviewed in detail including emphasizing most importantly the difference between maintenance and prns and under what circumstances the prns are to be triggered using an action plan format where appropriate.  Total time for H and P, chart review, counseling,  directly observing portions of ambulatory 02 saturation study/ and generating customized AVS unique to this office visit / same day  charting = 26 min final summary f/u ov

## 2022-11-26 ENCOUNTER — Ambulatory Visit (INDEPENDENT_AMBULATORY_CARE_PROVIDER_SITE_OTHER): Payer: Medicare Other

## 2022-11-26 DIAGNOSIS — I442 Atrioventricular block, complete: Secondary | ICD-10-CM

## 2022-11-26 LAB — CUP PACEART REMOTE DEVICE CHECK
Battery Remaining Longevity: 127 mo
Battery Remaining Percentage: 95.5 %
Battery Voltage: 3.02 V
Brady Statistic AP VP Percent: 1 %
Brady Statistic AP VS Percent: 2 %
Brady Statistic AS VP Percent: 1 %
Brady Statistic AS VS Percent: 98 %
Brady Statistic RA Percent Paced: 1.9 %
Brady Statistic RV Percent Paced: 1 %
Date Time Interrogation Session: 20240522020012
Implantable Lead Connection Status: 753985
Implantable Lead Connection Status: 753985
Implantable Lead Implant Date: 20231121
Implantable Lead Implant Date: 20231121
Implantable Lead Location: 753859
Implantable Lead Location: 753860
Implantable Pulse Generator Implant Date: 20231121
Lead Channel Impedance Value: 380 Ohm
Lead Channel Impedance Value: 440 Ohm
Lead Channel Pacing Threshold Amplitude: 0.5 V
Lead Channel Pacing Threshold Amplitude: 1 V
Lead Channel Pacing Threshold Pulse Width: 0.4 ms
Lead Channel Pacing Threshold Pulse Width: 0.5 ms
Lead Channel Sensing Intrinsic Amplitude: 1.8 mV
Lead Channel Sensing Intrinsic Amplitude: 5.6 mV
Lead Channel Setting Pacing Amplitude: 1.25 V
Lead Channel Setting Pacing Amplitude: 1.5 V
Lead Channel Setting Pacing Pulse Width: 0.5 ms
Lead Channel Setting Sensing Sensitivity: 2 mV
Pulse Gen Model: 2272
Pulse Gen Serial Number: 6860174

## 2022-11-28 ENCOUNTER — Inpatient Hospital Stay: Payer: Medicare Other

## 2022-11-28 VITALS — BP 118/49 | HR 73 | Temp 97.5°F | Resp 18

## 2022-11-28 DIAGNOSIS — D509 Iron deficiency anemia, unspecified: Secondary | ICD-10-CM | POA: Diagnosis not present

## 2022-11-28 DIAGNOSIS — D500 Iron deficiency anemia secondary to blood loss (chronic): Secondary | ICD-10-CM

## 2022-11-28 MED ORDER — SODIUM CHLORIDE 0.9 % IV SOLN
300.0000 mg | Freq: Once | INTRAVENOUS | Status: AC
  Administered 2022-11-28: 300 mg via INTRAVENOUS
  Filled 2022-11-28: qty 300

## 2022-11-28 MED ORDER — SODIUM CHLORIDE 0.9 % IV SOLN: Freq: Once | INTRAVENOUS | Status: AC

## 2022-11-28 NOTE — Progress Notes (Signed)
Patient took her tylenol and claritin today at 0900.  Iron infusion given per orders. Patient tolerated it well without problems. Vitals stable and discharged home from clinic via wheelchair. Follow up as scheduled.

## 2022-12-04 ENCOUNTER — Telehealth (HOSPITAL_COMMUNITY): Payer: Self-pay | Admitting: Vascular Surgery

## 2022-12-04 NOTE — Telephone Encounter (Signed)
Lvm to make ne PHT appt w/ either provider

## 2022-12-11 DIAGNOSIS — M542 Cervicalgia: Secondary | ICD-10-CM | POA: Diagnosis not present

## 2022-12-11 DIAGNOSIS — K219 Gastro-esophageal reflux disease without esophagitis: Secondary | ICD-10-CM | POA: Diagnosis not present

## 2022-12-11 DIAGNOSIS — M810 Age-related osteoporosis without current pathological fracture: Secondary | ICD-10-CM | POA: Diagnosis not present

## 2022-12-11 DIAGNOSIS — D508 Other iron deficiency anemias: Secondary | ICD-10-CM | POA: Diagnosis not present

## 2022-12-11 DIAGNOSIS — Z681 Body mass index (BMI) 19 or less, adult: Secondary | ICD-10-CM | POA: Diagnosis not present

## 2022-12-11 DIAGNOSIS — M797 Fibromyalgia: Secondary | ICD-10-CM | POA: Diagnosis not present

## 2022-12-11 DIAGNOSIS — M1991 Primary osteoarthritis, unspecified site: Secondary | ICD-10-CM | POA: Diagnosis not present

## 2022-12-11 DIAGNOSIS — I7300 Raynaud's syndrome without gangrene: Secondary | ICD-10-CM | POA: Diagnosis not present

## 2022-12-11 DIAGNOSIS — L98499 Non-pressure chronic ulcer of skin of other sites with unspecified severity: Secondary | ICD-10-CM | POA: Diagnosis not present

## 2022-12-11 DIAGNOSIS — M340 Progressive systemic sclerosis: Secondary | ICD-10-CM | POA: Diagnosis not present

## 2022-12-11 NOTE — Progress Notes (Deleted)
Haley Trevino, female    DOB: Sep 05, 1940    MRN: [id number]   Brief patient profile:  49  yowf  never smoker  referred to pulmonary clinic in [place]  07/22/2022 by Haley Trevino  for doe ? ILD    [place]nards in 2014 > [name] previously better on sulindafil   Admit date: 05/13/2022 Discharge date: 05/29/2022   Primary Care Provider: Bennie Pierini, FNP  Primary Cardiologist:  Haley Rotunda, MD Structural Heart: Haley Trevino & Haley. Laneta Trevino (TAVR)       Discharge Diagnoses    Principal Problem:   S/P TAVR (transcatheter aortic valve replacement)   [name]   Hypertension   Hyperlipidemia with target LDL less than 100   Nonrheumatic aortic valve stenosis   Acute on chronic diastolic heart failure (HCC)   Acute respiratory distress   Anxiety   [name]   Hyperkalemia   Heart block AV complete (HCC)    History of Present Illness  07/22/2022  Pulmonary/ 1st Trevino eval/ Haley Trevino / Haley Trevino  Chief Complaint  Patient presents with   Consult    Was told something may be wrong with her lungs  Dyspnea:  x  steps despite 02/  no ex program  Cough: minimal / occ choking since in hospital  Sleep: flat bed one pillow under head  SABA use:  02: 2.5  lpm  CP Ant left side  24/7  x years skin is sensitive w/in a year of L  breast surgery Rec Make sure you check your oxygen saturation  AT  your highest level of activity (not after you stop)   to be sure it stays over 90%     ER eval  10/31/22 for HA/sob/cp/ no change cxr     11/24/2022  post ER f/u ov/[place] Trevino/Haley Trevino re: doe with nl pfts 10/13/22  maint on omeprazole ac qd   Chief Complaint  Patient presents with   Follow-up    Recent flare of her scleroderma- she sees Haley Trevino in a couple weeks.   Dyspnea:  no problem with steps / not stopping at top /not checking sats at all  Cough: none  Sleeping: flat bed one pillow no resp cc  SABA use: none  02: not using x  2  weeks  Rec Make sure you check your oxygen saturation at your highest level of activity (NOT after you stop)  to be sure it stays over 90%  Stop all 02 as of today  Pulmonary follow up is as needed    12/12/2022  f/u ov/[place] Trevino/Haley Trevino re: doe with nl pfts 10/13/22  maint on ***  No chief complaint on file.   Dyspnea:  *** Cough: *** Sleeping: *** SABA use: *** 02: *** Covid status: *** Lung cancer screening: ***   No obvious day to day or daytime variability or assoc excess/ purulent sputum or mucus plugs or hemoptysis or cp or chest tightness, subjective wheeze or overt sinus or hb symptoms.   *** without nocturnal  or early am exacerbation  of respiratory  c/o's or need for noct saba. Also denies any obvious fluctuation of symptoms with weather or environmental changes or other aggravating or alleviating factors except as outlined above   No unusual exposure hx or h/o childhood pna/ asthma or knowledge of premature birth.  Current Allergies, Complete Past Medical History, Past Surgical History, Family History, and Social History were reviewed in [employer] record.  ROS  The  following are not active complaints unless bolded Hoarseness, sore throat, dysphagia, dental problems, itching, sneezing,  nasal congestion or discharge of excess mucus or purulent secretions, ear ache,   fever, chills, sweats, unintended wt loss or wt gain, classically pleuritic or exertional cp,  orthopnea pnd or arm/hand swelling  or leg swelling, presyncope, palpitations, abdominal pain, anorexia, nausea, vomiting, diarrhea  or change in bowel habits or change in bladder habits, change in stools or change in urine, dysuria, hematuria,  rash, arthralgias, visual complaints, headache, numbness, weakness or ataxia or problems with walking or coordination,  change in mood or  memory.        No outpatient medications have been marked as taking for the 12/12/22 encounter (Appointment) with  Haley Cowden, MD.               Past Medical History:  Diagnosis Date   Cataract    Depression    GERD (gastroesophageal reflux disease)    Hyperlipidemia    Hypertension    Hypothyroidism    IBS (irritable bowel syndrome)    Osteoarthritis    Raynaud disease    S/P TAVR (transcatheter aortic valve replacement) 05/20/2022   s/p TAVR with a 23 mm Edwards S3UR via the TF approach by Haley Trevino & Haley Trevino   Scleroderma [facility])    Thyroid disease    hypothyroidism      Objective:    Wts  12/12/2022         ***  11/24/22 106 lb 12.8 oz (48.4 kg)  11/12/22 108 lb (49 kg)  09/02/22 106 lb (48.1 kg)       Vital signs reviewed  12/12/2022  - Note at rest 02 sats  ***% on ***   General appearance:    ***      2/6 SEM s  increase in P2 ***   Assessment

## 2022-12-12 ENCOUNTER — Ambulatory Visit: Payer: Medicare Other | Admitting: Internal Medicine

## 2022-12-16 DIAGNOSIS — H1789 Other corneal scars and opacities: Secondary | ICD-10-CM | POA: Diagnosis not present

## 2022-12-16 DIAGNOSIS — H16223 Keratoconjunctivitis sicca, not specified as Sjogren's, bilateral: Secondary | ICD-10-CM | POA: Diagnosis not present

## 2022-12-16 DIAGNOSIS — H52203 Unspecified astigmatism, bilateral: Secondary | ICD-10-CM | POA: Diagnosis not present

## 2022-12-16 DIAGNOSIS — H1820 Unspecified corneal edema: Secondary | ICD-10-CM | POA: Diagnosis not present

## 2022-12-16 DIAGNOSIS — H353131 Nonexudative age-related macular degeneration, bilateral, early dry stage: Secondary | ICD-10-CM | POA: Diagnosis not present

## 2022-12-17 NOTE — Progress Notes (Signed)
Remote pacemaker transmission.   

## 2022-12-18 ENCOUNTER — Other Ambulatory Visit (HOSPITAL_COMMUNITY): Payer: Self-pay

## 2022-12-18 ENCOUNTER — Encounter (HOSPITAL_COMMUNITY): Payer: Self-pay | Admitting: Cardiology

## 2022-12-18 ENCOUNTER — Ambulatory Visit (HOSPITAL_COMMUNITY)
Admission: RE | Admit: 2022-12-18 | Discharge: 2022-12-18 | Disposition: A | Discharge status: Home / Self Care | Payer: Medicare Other | Source: Ambulatory Visit | Attending: Cardiology | Admitting: Cardiology

## 2022-12-18 VITALS — BP 128/60 | HR 74 | Wt 108.0 lb

## 2022-12-18 DIAGNOSIS — I110 Hypertensive heart disease with heart failure: Secondary | ICD-10-CM | POA: Insufficient documentation

## 2022-12-18 DIAGNOSIS — I272 Pulmonary hypertension, unspecified: Secondary | ICD-10-CM

## 2022-12-18 DIAGNOSIS — M349 Systemic sclerosis, unspecified: Secondary | ICD-10-CM | POA: Diagnosis not present

## 2022-12-18 DIAGNOSIS — Z7901 Long term (current) use of anticoagulants: Secondary | ICD-10-CM | POA: Insufficient documentation

## 2022-12-18 DIAGNOSIS — Z952 Presence of prosthetic heart valve: Secondary | ICD-10-CM | POA: Insufficient documentation

## 2022-12-18 DIAGNOSIS — I509 Heart failure, unspecified: Secondary | ICD-10-CM | POA: Diagnosis not present

## 2022-12-18 DIAGNOSIS — I350 Nonrheumatic aortic (valve) stenosis: Secondary | ICD-10-CM | POA: Insufficient documentation

## 2022-12-18 DIAGNOSIS — Z950 Presence of cardiac pacemaker: Secondary | ICD-10-CM | POA: Diagnosis not present

## 2022-12-18 LAB — BASIC METABOLIC PANEL
Anion gap: 10 (ref 5–15)
BUN: 12 mg/dL (ref 8–23)
CO2: 23 mmol/L (ref 22–32)
Calcium: 9 mg/dL (ref 8.9–10.3)
Chloride: 106 mmol/L (ref 98–111)
Creatinine, Ser: 0.95 mg/dL (ref 0.44–1.00)
GFR, Estimated: >60 mL/min (ref >60)
Glucose, Bld: 96 mg/dL (ref 70–99)
Potassium: 4.3 mmol/L (ref 3.5–5.1)
Sodium: 139 mmol/L (ref 135–145)

## 2022-12-18 NOTE — H&P (View-Only) (Signed)
 ADVANCED HE[facility] FAILURE CLINIC NOTE  Referring Physician: Martin, Trevino, *  Primary Care: Martin, Mary-Margaret, FNP Primary Cardiologist: Dr. Hochrein  HPI: Haley Trevino is a 82 y.o. female with severe aortic stenosis status post TAVR on 05/20/2022 with a 23 mm Edwards SAPIEN 3, recurrent respiratory failure and complete heart block now status post permanent pacemaker on 05/27/2022 by Dr. Lambert, atrial flutter during that time on Eliquis, anemia due to GI bleeding after being started on Eliquis and scleroderma presenting today for further evaluation of pulmonary hypertension. According to [name], she has a long history of mixed connective tissue disease followed by outside rheumatology. She struggles with significant pain from advanced Raynaud's in her digits and has also had progressively worsening fatigue and dyspnea. She has a strong family history of rheumatologic disease and is concerned about pulmonary effects of systemic sclerosis.   Her functional class is NYHA III; she is fairly deconditioned and limited by diffuse pain and frailty.    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 Thukkani & Bartle   Scleroderma (HCC)    Thyroid disease    hypothyroidism    Current Outpatient Medications  Medication Sig Dispense Refill   acetaminophen (TYLENOL) 500 MG tablet Take 1,000 mg by mouth at bedtime.     aspirin EC 81 MG tablet Take 81 mg by mouth daily. Swallow whole.     clonazePAM (KLONOPIN) 0.5 MG tablet Take 1 tablet (0.5 mg total) by mouth 2 (two) times daily as needed for anxiety. 180 tablet 1   ezetimibe (ZETIA) 10 MG tablet Take 1 tablet (10  mg total) by mouth daily. 90 tablet 1   FLUoxetine (PROZAC) 40 MG capsule Take 1 capsule (40 mg total) by mouth daily. 90 capsule 1   fluticasone (FLONASE) 50 MCG/ACT nasal spray Place 1 spray into both nostrils as needed for allergies or rhinitis.     furosemide (LASIX) 20 MG tablet Take 20 mg by mouth as needed.     NIFEdipine (PROCARDIA XL/NIFEDICAL XL) 60 MG 24 hr tablet Take 30 mg by mouth daily.     omeprazole (PRILOSEC) 20 MG capsule TAKE 1 TABLET DAILY 90 capsule 1   RESTASIS 0.05 % ophthalmic emulsion Place 1 drop into both eyes 2 (two) times daily.     SYNTHROID 75 MCG tablet TAKE (1) TABLET DAILY BE- FORE BREAKFAST. 90 tablet 1   PROLIA 60 MG/ML SOSY injection Inject 60 mg into the skin every 6 (six) months. (Patient not taking: Reported on 12/18/2022) 180 mL 0   No current facility-administered medications for this encounter.    Allergies  Allergen Reactions   Bactrim [Sulfamethoxazole-Trimethoprim] Nausea And Vomiting   Dilaudid [Hydromorphone Hcl] Anaphylaxis   Dilaudid [Hydromorphone] Anaphylaxis   Acyclovir And Related     Unknown reaction   Crestor [Rosuvastatin Calcium] Other (See Comments)    Muscle pain   Eliquis [Apixaban] Other (See Comments)    Caused bleed   Gabapentin Swelling    Swelling in feet, and in legs   Aleve [Naproxen Sodium] Rash   Lyrica [Pregabalin] Other (See Comments)      Swelling in feet, and in legs      Social History   Socioeconomic History   Marital status: Married    Spouse name: Not on file   Number of children: 2   Years of education: Not on file   Highest education level: Some college, no degree  Occupational History   Occupation: retired swithboard operator at [employer]  Tobacco Use   Smoking status: Never   Smokeless tobacco: Never   Tobacco comments:    passive tobacco smoke exposure as child  Vaping Use   Vaping Use: Never used  Substance and Sexual Activity   Alcohol use: No   Drug use: No   Sexual activity: Not  Currently    Birth control/protection: Surgical  Other Topics Concern   Not on file  Social History Narrative   Regular exercise: walkingCaffeine use: 1 cup of coffee daily   Lives in split level home with her husband   Social Determinants of Health   Financial Resource Strain: Low Risk  (06/03/2022)   Overall Financial Resource Strain (CARDIA)    Difficulty of Paying Living Expenses: Not hard at all  Food Insecurity: No Food Insecurity (06/02/2022)   Hunger Vital Sign    Worried About Running Out of Food in the Last Year: Never true    Ran Out of Food in the Last Year: Never true  Transportation Needs: No Transportation Needs (06/03/2022)   PRAPARE - Transportation    Lack of Transportation (Medical): No    Lack of Transportation (Non-Medical): No  Physical Activity: Inactive (06/02/2022)   Exercise Vital Sign    Days of Exercise per Week: 0 days    Minutes of Exercise per Session: 0 min  Stress: No Stress Concern Present (06/02/2022)   [employer] - Occupational Stress Questionnaire    Feeling of Stress : Only a little  Social Connections: Moderately Integrated (06/02/2022)   Social Connection and Isolation Panel [NHANES]    Frequency of Communication with Friends and Family: More than three times a week    Frequency of Social Gatherings with Friends and Family: More than three times a week    Attends Religious Services: More than 4 times per year    Active Member of Clubs or Organizations: No    Attends Club or Organization Meetings: Never    Marital Status: Married  Intimate Partner Violence: Not At Risk (06/02/2022)   Humiliation, Afraid, Rape, and Kick questionnaire    Fear of Current or Ex-Partner: No    Emotionally Abused: No    Physically Abused: No    Sexually Abused: No      Family History  Problem Relation Age of Onset   Pancreatic cancer Father    Raynaud syndrome Father    Cancer Father        pancreatic   Rashes / Skin  problems Daughter        possibly scleraderma   Hip fracture Mother    Mental illness Mother        attempted suicide at 56 yo    PHYSICAL EXAM: Vitals:   12/18/22 1414  BP: 128/60  Pulse: 74  SpO2: 97%   GENERAL: thin F; anxious HEENT: Negative for arcus senilis or xanthelasma. There is no scleral icterus.  The mucous membranes are pink and moist.   NECK: Supple, No masses. Normal carotid upstrokes without bruits. No masses or thyromegaly.    CHEST: There are no chest wall deformities. There is no chest wall   tenderness. Respirations are unlabored.  Lungs- coarse lung sounds w/ inspiratory crackles CARDIAC:  JVP: 8 cm H2O         Normal S1, S2  Normal rate with regular rhythm. No murmurs, rubs or gallops.  Pulses are 2+ and symmetrical in upper and lower extremities. No edema.  ABDOMEN: Soft, non-tender, non-distended. There are no masses or hepatomegaly. There are normal bowel sounds.  EXTREMITIES: Warm and well perfused with no cyanosis, clubbing.  LYMPHATIC: No axillary or supraclavicular lymphadenopathy.  NEUROLOGIC: Patient is oriented x3 with no focal or lateralizing neurologic deficits.  PSYCH: Patients affect is appropriate, there is no evidence of anxiety or depression.  SKIN: Warm and dry; no lesions or wounds.   DATA REVIEW  ECG: 12/18/22: NSR  as per my personal interpretation  ECHO: 06/20/22: LVEF 60-65%, grade II DD, normal RV function. Mildly elevated PASP as per my personal interpretation  CATH: 03/28/22: 1.  Minimal obstructive coronary artery disease. 2.  Cardiac output of 8.1 L/min and cardiac index of 5.7 L/min/m with mean RA pressure 14 mmHg, mean wedge pressure of 27 mmHg, and PVR of 1.92 Woods units.  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 This was done prior to TAVR with volume overload. After TAVR she had some improvement in symptoms, however, has now had progression of dyspnea and fatigue without signs of overt volume overload. Will plan on repeat RHC to evaluate for PH.  - Otherwise no medication changes at this time.   2. Severe AS s/p TAVR  - Repeat TTE demonstrate well functional TAVR valve. Followed closely by structural team.   3. CHB  - s/p PPM  - normal RV function   4. Scleroderma - Follows Dr. [name] - Will obtain outside records - PFTs with severely reduced DLCO consistent with parenchymal lung disease likely 2/2 scleroderma.   Haley Trevino Advanced [facility] Failure Mechanical Circulatory Support  

## 2022-12-18 NOTE — Patient Instructions (Signed)
There has been no changes to your medications.  Labs done today, your results will be available in MyChart, we will contact you for abnormal readings.  You are scheduled for a Cardiac Catheterization on Friday, July 12 with Dr.  Gasper Lloyd .  1. Please arrive at the [facility] (Main Entrance A) at [facility]: [address] [place], [place] [address] at 10:00 AM (This time is 2 hour(s) before your procedure to ensure your preparation). Free valet parking service is available. You will check in at ADMITTING. The support person will be asked to wait in the waiting room.  It is OK to have someone drop you off and come back when you are ready to be discharged.    Special note: Every effort is made to have your procedure done on time. Please understand that emergencies sometimes delay scheduled procedures.  2. Diet: Do not eat solid foods after midnight.  The patient may have clear liquids until 5am upon the day of the procedure.  3.  Medication instructions in preparation for your procedure:   Contrast Allergy: No  HOLD LASIX THE MORNING OF YOUR PROCEDURE.    On the morning of your procedure, take your Aspirin 81 mg and any morning medicines NOT listed above.  You may use sips of water.  5. Plan to go home the same day, you will only stay overnight if medically necessary. 6. Bring a current list of your medications and current insurance cards. 7. You MUST have a responsible person to drive you home. 8. Someone MUST be with you the first 24 hours after you arrive home or your discharge will be delayed. 9. Please wear clothes that are easy to get on and off and wear slip-on shoes.  Your physician has requested that you have an echocardiogram. Echocardiography is a painless test that uses sound waves to create images of your heart. It provides your doctor with information about the size and shape of your heart and how well your heart's chambers and valves are working. This procedure  takes approximately one hour. There are no restrictions for this procedure. Please do NOT wear cologne, perfume, aftershave, or lotions (deodorant is allowed). Please arrive 15 minutes prior to your appointment time.   Your physician recommends that you schedule a follow-up appointment in: 6 MONTHS ( December) ** please call the in October to arrange your follow up appointment. **  If you have any questions or concerns before your next appointment please send [place] a message through [place] or call our office at [phone].    TO LEAVE A MESSAGE FOR THE NURSE SELECT OPTION 2, PLEASE LEAVE A MESSAGE INCLUDING: YOUR NAME DATE OF BIRTH CALL BACK NUMBER REASON FOR CALL**this is important as we prioritize the call backs  YOU WILL RECEIVE A CALL BACK THE SAME DAY AS LONG AS YOU CALL BEFORE 4:00 PM  At the [facility], you and your health needs are our priority. As part of our continuing mission to provide you with exceptional heart care, we have created designated Provider Care Teams. These Care Teams include your primary Cardiologist (physician) and Advanced Practice Providers (APPs- Physician Assistants and Nurse Practitioners) who all work together to provide you with the care you need, when you need it.   You may see any of the following providers on your designated Care Team at your next follow up: Dr Arvilla Meres Dr Marca Ancona Dr. Marcos Eke, NP Robbie Lis, [place] Valrie Hart  [place], [place] Brynda Peon, NP Karle Plumber, PharmD   Please be sure to bring in all your medications bottles to every appointment.    Thank you for choosing [facility]

## 2022-12-18 NOTE — Progress Notes (Signed)
ADVANCED HEART FAILURE CLINIC NOTE  Referring Physician: Bennie Pierini, *  Primary Care: Bennie Pierini, FNP Primary Cardiologist: Dr. Antoine Poche  HPI: Haley Trevino is a 82 y.o. female with severe aortic stenosis status post TAVR on 05/20/2022 with a 23 mm Edwards SAPIEN 3, recurrent respiratory failure and complete heart block now status post permanent pacemaker on 05/27/2022 by Dr. Lalla Brothers, atrial flutter during that time on Eliquis, anemia due to GI bleeding after being started on Eliquis and scleroderma presenting today for further evaluation of pulmonary hypertension. According to Ms. Fedak, she has a long history of mixed connective tissue disease followed by outside rheumatology. She struggles with significant pain from advanced Raynaud's in her digits and has also had progressively worsening fatigue and dyspnea. She has a strong family history of rheumatologic disease and is concerned about pulmonary effects of systemic sclerosis.   Her functional class is NYHA III; she is fairly deconditioned and limited by diffuse pain and frailty.    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 Lynnette Caffey & Bartle   Scleroderma [facility])    Thyroid disease    hypothyroidism    Current Outpatient Medications  Medication Sig Dispense Refill   acetaminophen (TYLENOL) 500 MG tablet Take 1,000 mg by mouth at bedtime.     aspirin EC 81 MG tablet Take 81 mg by mouth daily. Swallow whole.     clonazePAM (KLONOPIN) 0.5 MG tablet Take 1 tablet (0.5 mg total) by mouth 2 (two) times daily as needed for anxiety. 180 tablet 1   ezetimibe (ZETIA) 10 MG tablet Take 1 tablet (10  mg total) by mouth daily. 90 tablet 1   FLUoxetine (PROZAC) 40 MG capsule Take 1 capsule (40 mg total) by mouth daily. 90 capsule 1   fluticasone (FLONASE) 50 MCG/ACT nasal spray Place 1 spray into both nostrils as needed for allergies or rhinitis.     furosemide (LASIX) 20 MG tablet Take 20 mg by mouth as needed.     NIFEdipine (PROCARDIA XL/NIFEDICAL XL) 60 MG 24 hr tablet Take 30 mg by mouth daily.     omeprazole (PRILOSEC) 20 MG capsule TAKE 1 TABLET DAILY 90 capsule 1   RESTASIS 0.05 % ophthalmic emulsion Place 1 drop into both eyes 2 (two) times daily.     SYNTHROID 75 MCG tablet TAKE (1) TABLET DAILY BE- FORE BREAKFAST. 90 tablet 1   PROLIA 60 MG/ML SOSY injection Inject 60 mg into the skin every 6 (six) months. (Patient not taking: Reported on 12/18/2022) 180 mL 0   No current facility-administered medications for this encounter.    Allergies  Allergen Reactions   Bactrim [Sulfamethoxazole-Trimethoprim] Nausea And Vomiting   Dilaudid [Hydromorphone Hcl] Anaphylaxis   Dilaudid [Hydromorphone] Anaphylaxis   Acyclovir And Related     Unknown reaction   Crestor [Rosuvastatin Calcium] Other (See Comments)    Muscle pain   Eliquis [Apixaban] Other (See Comments)    Caused bleed   Gabapentin Swelling    Swelling in feet, and in legs   Aleve [Naproxen Sodium] Rash   Lyrica [Pregabalin] Other (See Comments)  Swelling in feet, and in legs      Social History   Socioeconomic History   Marital status: Married    Spouse name: Not on file   Number of children: 2   Years of education: Not on file   Highest education level: Some college, no degree  Occupational History   Occupation: retired Holiday representative at [employer]  Tobacco Use   Smoking status: Never   Smokeless tobacco: Never   Tobacco comments:    passive tobacco smoke exposure as child  Vaping Use   Vaping Use: Never used  Substance and Sexual Activity   Alcohol use: No   Drug use: No   Sexual activity: Not  Currently    Birth control/protection: Surgical  Other Topics Concern   Not on file  Social History Narrative   Regular exercise: walkingCaffeine use: 1 cup of coffee daily   Lives in split level home with her husband   Social Determinants of Health   Financial Resource Strain: Low Risk  (06/03/2022)   Overall Financial Resource Strain (CARDIA)    Difficulty of Paying Living Expenses: Not hard at all  Food Insecurity: No Food Insecurity (06/02/2022)   Hunger Vital Sign    Worried About Running Out of Food in the Last Year: Never true    Ran Out of Food in the Last Year: Never true  Transportation Needs: No Transportation Needs (06/03/2022)   PRAPARE - Administrator, Civil Service (Medical): No    Lack of Transportation (Non-Medical): No  Physical Activity: Inactive (06/02/2022)   Exercise Vital Sign    Days of Exercise per Week: 0 days    Minutes of Exercise per Session: 0 min  Stress: No Stress Concern Present (06/02/2022)   [employer] of Occupational Health - Occupational Stress Questionnaire    Feeling of Stress : Only a little  Social Connections: Moderately Integrated (06/02/2022)   Social Connection and Isolation Panel [NHANES]    Frequency of Communication with Friends and Family: More than three times a week    Frequency of Social Gatherings with Friends and Family: More than three times a week    Attends Religious Services: More than 4 times per year    Active Member of [employer] or Organizations: No    Attends Banker Meetings: Never    Marital Status: Married  Catering manager Violence: Not At Risk (06/02/2022)   Humiliation, Afraid, Rape, and Kick questionnaire    Fear of Current or Ex-Partner: No    Emotionally Abused: No    Physically Abused: No    Sexually Abused: No      Family History  Problem Relation Age of Onset   Pancreatic cancer Father    Raynaud syndrome Father    Cancer Father        pancreatic   Rashes / Skin  problems Daughter        possibly scleraderma   Hip fracture Mother    Mental illness Mother        attempted suicide at 33 yo    PHYSICAL EXAM: Vitals:   12/18/22 1414  BP: 128/60  Pulse: 74  SpO2: 97%   GENERAL: thin F; anxious HEENT: Negative for arcus senilis or xanthelasma. There is no scleral icterus.  The mucous membranes are pink and moist.   NECK: Supple, No masses. Normal carotid upstrokes without bruits. No masses or thyromegaly.    CHEST: There are no chest wall deformities. There is no chest wall  tenderness. Respirations are unlabored.  Lungs- coarse lung sounds w/ inspiratory crackles CARDIAC:  JVP: 8 cm H2O         Normal S1, S2  Normal rate with regular rhythm. No murmurs, rubs or gallops.  Pulses are 2+ and symmetrical in upper and lower extremities. No edema.  ABDOMEN: Soft, non-tender, non-distended. There are no masses or hepatomegaly. There are normal bowel sounds.  EXTREMITIES: Warm and well perfused with no cyanosis, clubbing.  LYMPHATIC: No axillary or supraclavicular lymphadenopathy.  NEUROLOGIC: Patient is oriented x3 with no focal or lateralizing neurologic deficits.  PSYCH: Patients affect is appropriate, there is no evidence of anxiety or depression.  SKIN: Warm and dry; no lesions or wounds.   DATA REVIEW  ECG: 12/18/22: NSR  as per my personal interpretation  ECHO: 06/20/22: LVEF 60-65%, grade II DD, normal RV function. Mildly elevated PASP as per my personal interpretation  CATH: 03/28/22: 1.  Minimal obstructive coronary artery disease. 2.  Cardiac output of 8.1 L/min and cardiac index of 5.7 L/min/m with mean RA pressure 14 mmHg, mean wedge pressure of 27 mmHg, and PVR of 1.92 Woods units.  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 This was done prior to TAVR with volume overload. After TAVR she had some improvement in symptoms, however, has now had progression of dyspnea and fatigue without signs of overt volume overload. Will plan on repeat RHC to evaluate for PH.  - Otherwise no medication changes at this time.   2. Severe AS s/p TAVR  - Repeat TTE demonstrate well functional TAVR valve. Followed closely by structural team.   3. CHB  - s/p PPM  - normal RV function   4. Scleroderma - Follows Dr. Dierdre Forth - Will obtain outside records - PFTs with severely reduced DLCO consistent with parenchymal lung disease likely 2/2 scleroderma.   Airiana Elman Advanced Heart Failure Mechanical Circulatory Support

## 2022-12-24 ENCOUNTER — Inpatient Hospital Stay: Payer: Medicare Other | Attending: Hematology

## 2022-12-24 DIAGNOSIS — D509 Iron deficiency anemia, unspecified: Secondary | ICD-10-CM | POA: Insufficient documentation

## 2022-12-24 DIAGNOSIS — D500 Iron deficiency anemia secondary to blood loss (chronic): Secondary | ICD-10-CM

## 2022-12-24 LAB — CBC WITH DIFFERENTIAL/PLATELET
Abs Immature Granulocytes: 0.01 10*3/uL (ref 0.00–0.07)
Basophils Absolute: 0.1 10*3/uL (ref 0.0–0.1)
Basophils Relative: 1 %
Eosinophils Absolute: 0.2 10*3/uL (ref 0.0–0.5)
Eosinophils Relative: 3 %
HCT: 35.1 % — ABNORMAL LOW (ref 36.0–46.0)
Hemoglobin: 11.2 g/dL — ABNORMAL LOW (ref 12.0–15.0)
Immature Granulocytes: 0 %
Lymphocytes Relative: 20 %
Lymphs Abs: 1.1 10*3/uL (ref 0.7–4.0)
MCH: 29.6 pg (ref 26.0–34.0)
MCHC: 31.9 g/dL (ref 30.0–36.0)
MCV: 92.6 fL (ref 80.0–100.0)
Monocytes Absolute: 0.3 10*3/uL (ref 0.1–1.0)
Monocytes Relative: 6 %
Neutro Abs: 4.1 10*3/uL (ref 1.7–7.7)
Neutrophils Relative %: 70 %
Platelets: 214 10*3/uL (ref 150–400)
RBC: 3.79 MIL/uL — ABNORMAL LOW (ref 3.87–5.11)
RDW: 15.5 % (ref 11.5–15.5)
WBC: 5.8 10*3/uL (ref 4.0–10.5)
nRBC: 0 % (ref 0.0–0.2)

## 2022-12-24 LAB — BASIC METABOLIC PANEL - CANCER CENTER ONLY
Anion gap: 6 (ref 5–15)
BUN: 12 mg/dL (ref 8–23)
CO2: 23 mmol/L (ref 22–32)
Calcium: 8.6 mg/dL — ABNORMAL LOW (ref 8.9–10.3)
Chloride: 106 mmol/L (ref 98–111)
Creatinine: 0.89 mg/dL (ref 0.44–1.00)
GFR, Estimated: >60 mL/min (ref >60)
Glucose, Bld: 91 mg/dL (ref 70–99)
Potassium: 3.9 mmol/L (ref 3.5–5.1)
Sodium: 135 mmol/L (ref 135–145)

## 2022-12-24 LAB — SAMPLE TO BLOOD BANK

## 2022-12-24 LAB — IRON AND TIBC
Iron: 69 ug/dL (ref 28–170)
Saturation Ratios: 20 % (ref 10.4–31.8)
TIBC: 354 ug/dL (ref 250–450)
UIBC: 285 ug/dL

## 2022-12-24 LAB — FERRITIN: Ferritin: 68 ng/mL (ref 11–307)

## 2022-12-29 NOTE — Progress Notes (Unsigned)
VIRTUAL VISIT via TELEPHONE NOTE [facility]   I connected with Haley Trevino  on 12/30/22 at  10:45 AM by telephone and verified that I am speaking with the correct person using two identifiers.  Location: Patient: Home Provider: [facility]   I discussed the limitations, risks, security and privacy concerns of performing an evaluation and management service by telephone and the availability of in person appointments. I also discussed with the patient that there may be a patient responsible charge related to this service. The patient expressed understanding and agreed to proceed.  REASON FOR VISIT:  Follow-up for iron deficiency anemia   CURRENT THERAPY: IV iron infusions with intermittent PRBC transfusions  INTERVAL HISTORY:  [name] is contacted today for follow-up of iron deficiency anemia.  She was last evaluated via telemedicine visit by Rojelio Brenner PA-C 10/27/2022.  At today's visit, she reports feeling poorly due to ongoing fatigue and diffuse musculoskeletal pain.  She has been told that she "might have fibromyalgia," and is being referred to pain management by her rheumatologist.   She noticed some slight decrease dizziness, lightheadedness, and shortness of breath after her most recent IV iron (Venofer 300 mg x 3 in May 2024).  She continues to have ongoing fatigue.  She has chronic musculoskeletal chest pain.  She remains off of Eliquis to date, but is taking 81 mg aspirin. She has not nboticed any recurrent melena since stopping Eliquis; no hematochezia.  She continues to follow closely with cardiology and pulmonology, and reports that she has been told some of her symptoms may be due to pulmonary arterial hypertension. She also reports increased pain and itching secondary to scleroderma flareup.  She had previously lost some weight following TAVR in November 2023, but is now back up to her baseline weight.  She reports  little to no energy and 50% appetite.   REVIEW OF SYSTEMS:   Review of Systems  Constitutional:  Positive for malaise/fatigue. Negative for chills, diaphoresis, fever and weight loss.  Respiratory:  Positive for shortness of breath. Negative for cough.   Cardiovascular:  Positive for chest pain (Musculoskeletal chest pain). Negative for palpitations.  Gastrointestinal:  Negative for abdominal pain, blood in stool, melena, nausea and vomiting.  Genitourinary:  Positive for urgency.  Musculoskeletal:  Positive for back pain, joint pain, myalgias and neck pain.  Neurological:  Positive for dizziness, tingling and sensory change (itching). Negative for headaches.  Psychiatric/Behavioral:  Positive for depression. The patient is nervous/anxious and has insomnia.     PHYSICAL EXAM: (per limitations of virtual telephone visit)  The patient is alert and oriented x 3, exhibiting adequate mentation, good mood, and ability to speak in full sentences and execute sound judgement.  ASSESSMENT & PLAN:  1.    Iron deficiency anemia - Initially referred to hematology by PCP after labs obtained 09/21/2020 significant for normocytic anemia (Hgb 10.2, MCV 83.0) and iron deficiency (ferritin 13) - EGD (10/25/2020): 2 angiodysplastic lesions without bleeding in the duodenum, another angiodysplastic lesion in duodenum; treated by APC - Capsule study (11/29/2020): At least 3 proximal and mid small bowel AVMs, nonbleeding, but also with some possible red material in the proximal colon - Other work-up: SPEP/IFE/light chains unremarkable; LDH normal; CMP unremarkable  - Blood transfusion: PRBC x1 on 12/14/2020 due to Hgb 7.0  - She had TAVR on 05/20/2022, and was hospitalized from 05/13/2022 through 05/29/2022 (PRBC transfusion x 1 on 05/13/2022); hemoglobin was stable at discharge  around 9.8. - Was placed on Eliquis for new onset A-fib/flutter in November 2023, but developed profuse melanotic stool within 48 hours of  starting Eliquis.  Patient self discontinued her Eliquis and within 24 hours of stopping Eliquis her bowel movements had returned to normal. -Severe anemia with Hgb 6.6 on 06/09/2022, therefore transfused PRBC x 2 units. - She continues to follow with GI (Dr. Ewing Schlein via [facility]) - Most recent IV iron with Venofer 300 mg x 3 in May 2024 -She denies any melena or hematochezia in the past 3 months. - She reports feeling increased fatigue - CBC/D (12/24/2022): Hgb 11.2/MCV 92.6, ferritin 68, iron saturation 20%.  Normal creatinine/GFR. - Differential diagnosis favors iron deficiency anemia secondary to chronic GI blood loss; may also be an aspect of malabsorption in the setting of scleroderma, low meat intake, and use of PPI - PLAN: Recommend Venofer 400 mg x 1  - Labs (CBC/D, BB sample, ferritin, iron/TIBC) and RTC in 2 months with PHONE visit   PLAN SUMMARY: >> Venofer 400 mg x 1 >> Labs in 2 months = CBC/D, BB sample, ferritin, iron/TIBC >> PHONE visit in 2 months     I discussed the assessment and treatment plan with the patient. The patient was provided an opportunity to ask questions and all were answered. The patient agreed with the plan and demonstrated an understanding of the instructions.   The patient was advised to call back or seek an in-person evaluation if the symptoms worsen or if the condition fails to improve as anticipated.  I provided 22 minutes of non-face-to-face time during this encounter.  Carnella Guadalajara, PA-C 12/30/22 11:08 AM

## 2022-12-30 ENCOUNTER — Encounter: Payer: Self-pay | Admitting: Physical Medicine and Rehabilitation

## 2022-12-30 ENCOUNTER — Encounter: Payer: Self-pay | Admitting: Family

## 2022-12-30 ENCOUNTER — Ambulatory Visit (INDEPENDENT_AMBULATORY_CARE_PROVIDER_SITE_OTHER): Payer: Medicare Other | Admitting: Family

## 2022-12-30 ENCOUNTER — Inpatient Hospital Stay (HOSPITAL_BASED_OUTPATIENT_CLINIC_OR_DEPARTMENT_OTHER): Payer: Medicare Other | Admitting: Physician Assistant

## 2022-12-30 VITALS — BP 121/56 | HR 99 | Temp 98.2°F | Ht 62.0 in | Wt 110.6 lb

## 2022-12-30 DIAGNOSIS — R350 Frequency of micturition: Secondary | ICD-10-CM

## 2022-12-30 DIAGNOSIS — R399 Unspecified symptoms and signs involving the genitourinary system: Secondary | ICD-10-CM

## 2022-12-30 DIAGNOSIS — D500 Iron deficiency anemia secondary to blood loss (chronic): Secondary | ICD-10-CM

## 2022-12-30 LAB — URINALYSIS, ROUTINE W REFLEX MICROSCOPIC
Bilirubin, UA: NEGATIVE
Glucose, UA: NEGATIVE
Ketones, UA: NEGATIVE
Leukocytes,UA: NEGATIVE
Nitrite, UA: NEGATIVE
Protein,UA: NEGATIVE
RBC, UA: NEGATIVE
Specific Gravity, UA: 1.01 (ref 1.005–1.030)
Urobilinogen, Ur: 0.2 mg/dL (ref 0.2–1.0)
pH, UA: 5.5 (ref 5.0–7.5)

## 2022-12-30 MED ORDER — CEPHALEXIN 500 MG PO CAPS
500.0000 mg | ORAL_CAPSULE | Freq: Two times a day (BID) | ORAL | 0 refills | Status: DC
Start: 2022-12-30 — End: 2023-02-09

## 2022-12-30 NOTE — Progress Notes (Signed)
   Subjective:    Patient ID: Haley Trevino, female    DOB: 01-27-1941, 82 y.o.   MRN: [id number]  Chief Complaint  Patient presents with   Urinary Frequency    X 1 week     Urinary Frequency  This is a new problem. The current episode started 1 to 4 weeks ago. The problem occurs every urination. The problem has been unchanged. The quality of the pain is described as burning. The pain is at a severity of 2/10. The pain is mild. There has been no fever. Associated symptoms include frequency, hesitancy and urgency. Pertinent negatives include no flank pain, hematuria, nausea or vomiting. She has tried increased fluids for the symptoms. The treatment provided mild relief.      Review of Systems  Gastrointestinal:  Negative for nausea and vomiting.  Genitourinary:  Positive for frequency, hesitancy and urgency. Negative for flank pain and hematuria.  All other systems reviewed and are negative.      Objective:   Physical Exam Vitals reviewed.  Constitutional:      General: She is not in acute distress.    Appearance: She is well-developed.  HENT:     Head: Normocephalic and atraumatic.  Eyes:     Pupils: Pupils are equal, round, and reactive to light.  Neck:     Thyroid: No thyromegaly.  Cardiovascular:     Rate and Rhythm: Normal rate and regular rhythm.     Heart sounds: Normal heart sounds. No murmur heard. Pulmonary:     Effort: Pulmonary effort is normal. No respiratory distress.     Breath sounds: Normal breath sounds. No wheezing.  Abdominal:     General: Bowel sounds are normal. There is no distension.     Palpations: Abdomen is soft.     Tenderness: There is no abdominal tenderness.  Musculoskeletal:        General: No tenderness. Normal range of motion.     Cervical back: Normal range of motion and neck supple.  Skin:    General: Skin is warm and dry.  Neurological:     Mental Status: She is alert and oriented to person, place, and time.     Cranial  Nerves: No cranial nerve deficit.     Deep Tendon Reflexes: Reflexes are normal and symmetric.  Psychiatric:        Behavior: Behavior normal.        Thought Content: Thought content normal.        Judgment: Judgment normal.       BP (!) 121/56   Pulse 99   Temp 98.2 F (36.8 C) (Temporal)   Ht 5\' 2"  (1.575 m)   Wt 110 lb 9.6 oz (50.2 kg)   SpO2 95%   BMI 20.23 kg/m      Assessment & Plan:  Haley Trevino comes in today with chief complaint of Urinary Frequency (X 1 week )   Diagnosis and orders addressed:  1. Urinary frequency - Urinalysis, Routine w reflex microscopic - Urine Culture  2. UTI symptoms Urine negative today I am off the rest of the week, will send in Keflex if symptoms worsen or do not improve  Urine culture pending  Force fluids Follow up with PCP if symptoms worsen or do not improve  - cephALEXin (KEFLEX) 500 MG capsule; Take 1 capsule (500 mg total) by mouth 2 (two) times daily.  Dispense: 14 capsule; Refill: 0 - Urine Culture   Jannifer Rodney, FNP

## 2022-12-30 NOTE — Patient Instructions (Signed)
Urinary Tract Infection, Adult  A urinary tract infection (UTI) is an infection of any part of the urinary tract. The urinary tract includes the kidneys, ureters, bladder, and urethra. These organs make, store, and get rid of urine in the body. An upper UTI affects the ureters and kidneys. A lower UTI affects the bladder and urethra. What are the causes? Most urinary tract infections are caused by bacteria in your genital area around your urethra, where urine leaves your body. These bacteria grow and cause inflammation of your urinary tract. What increases the risk? You are more likely to develop this condition if: You have a urinary catheter that stays in place. You are not able to control when you urinate or have a bowel movement (incontinence). You are female and you: Use a spermicide or diaphragm for birth control. Have low estrogen levels. Are pregnant. You have certain genes that increase your risk. You are sexually active. You take antibiotic medicines. You have a condition that causes your flow of urine to slow down, such as: An enlarged prostate, if you are female. Blockage in your urethra. A kidney stone. A nerve condition that affects your bladder control (neurogenic bladder). Not getting enough to drink, or not urinating often. You have certain medical conditions, such as: Diabetes. A weak disease-fighting system (immunesystem). Sickle cell disease. Gout. Spinal cord injury. What are the signs or symptoms? Symptoms of this condition include: Needing to urinate right away (urgency). Frequent urination. This may include small amounts of urine each time you urinate. Pain or burning with urination. Blood in the urine. Urine that smells bad or unusual. Trouble urinating. Cloudy urine. Vaginal discharge, if you are female. Pain in the abdomen or the lower back. You may also have: Vomiting or a decreased appetite. Confusion. Irritability or tiredness. A fever or  chills. Diarrhea. The first symptom in older adults may be confusion. In some cases, they may not have any symptoms until the infection has worsened. How is this diagnosed? This condition is diagnosed based on your medical history and a physical exam. You may also have other tests, including: Urine tests. Blood tests. Tests for STIs (sexually transmitted infections). If you have had more than one UTI, a cystoscopy or imaging studies may be done to determine the cause of the infections. How is this treated? Treatment for this condition includes: Antibiotic medicine. Over-the-counter medicines to treat discomfort. Drinking enough water to stay hydrated. If you have frequent infections or have other conditions such as a kidney stone, you may need to see a health care provider who specializes in the urinary tract (urologist). In rare cases, urinary tract infections can cause sepsis. Sepsis is a life-threatening condition that occurs when the body responds to an infection. Sepsis is treated in the hospital with IV antibiotics, fluids, and other medicines. Follow these instructions at home:  Medicines Take over-the-counter and prescription medicines only as told by your health care provider. If you were prescribed an antibiotic medicine, take it as told by your health care provider. Do not stop using the antibiotic even if you start to feel better. General instructions Make sure you: Empty your bladder often and completely. Do not hold urine for long periods of time. Empty your bladder after sex. Wipe from front to back after urinating or having a bowel movement if you are female. Use each tissue only one time when you wipe. Drink enough fluid to keep your urine pale yellow. Keep all follow-up visits. This is important. Contact a health   care provider if: Your symptoms do not get better after 1-2 days. Your symptoms go away and then return. Get help right away if: You have severe pain in  your back or your lower abdomen. You have a fever or chills. You have nausea or vomiting. Summary A urinary tract infection (UTI) is an infection of any part of the urinary tract, which includes the kidneys, ureters, bladder, and urethra. Most urinary tract infections are caused by bacteria in your genital area. Treatment for this condition often includes antibiotic medicines. If you were prescribed an antibiotic medicine, take it as told by your health care provider. Do not stop using the antibiotic even if you start to feel better. Keep all follow-up visits. This is important. This information is not intended to replace advice given to you by your health care provider. Make sure you discuss any questions you have with your health care provider. Document Revised: 01/29/2020 Document Reviewed: 02/03/2020 Elsevier Patient Education  2024 [employer]  

## 2023-01-01 LAB — URINE CULTURE

## 2023-01-06 ENCOUNTER — Inpatient Hospital Stay: Payer: Medicare Other | Attending: Hematology

## 2023-01-06 VITALS — BP 123/52 | HR 71 | Temp 98.0°F | Resp 18

## 2023-01-06 DIAGNOSIS — D500 Iron deficiency anemia secondary to blood loss (chronic): Secondary | ICD-10-CM

## 2023-01-06 DIAGNOSIS — D509 Iron deficiency anemia, unspecified: Secondary | ICD-10-CM | POA: Insufficient documentation

## 2023-01-06 MED ORDER — SODIUM CHLORIDE 0.9 % IV SOLN
400.0000 mg | Freq: Once | INTRAVENOUS | Status: AC
  Administered 2023-01-06: 400 mg via INTRAVENOUS
  Filled 2023-01-06: qty 400

## 2023-01-06 MED ORDER — SODIUM CHLORIDE 0.9 % IV SOLN: Freq: Once | INTRAVENOUS | Status: AC

## 2023-01-06 NOTE — Patient Instructions (Signed)
[facility] [place]  Discharge Instructions: Thank you for choosing [facility] to provide your oncology and hematology care.  If you have a lab appointment with the Cancer Center - please note that after April 8th, 2024, all labs will be drawn in the cancer center.  You do not have to check in or register with the main entrance as you have in the past but will complete your check-in in the cancer center.  Wear comfortable clothing and clothing appropriate for easy access to any Portacath or PICC line.   We strive to give you quality time with your provider. You may need to reschedule your appointment if you arrive late (15 or more minutes).  Arriving late affects you and other patients whose appointments are after yours.  Also, if you miss three or more appointments without notifying the office, you may be dismissed from the clinic at the provider's discretion.      For prescription refill requests, have your pharmacy contact our office and allow 72 hours for refills to be completed.    Today you received your iron infusion   To help prevent nausea and vomiting after your treatment, we encourage you to take your nausea medication as directed.  BELOW ARE SYMPTOMS THAT SHOULD BE REPORTED IMMEDIATELY: *FEVER GREATER THAN 100.4 F (38 C) OR HIGHER *CHILLS OR SWEATING *NAUSEA AND VOMITING THAT IS NOT CONTROLLED WITH YOUR NAUSEA MEDICATION *UNUSUAL SHORTNESS OF BREATH *UNUSUAL BRUISING OR BLEEDING *URINARY PROBLEMS (pain or burning when urinating, or frequent urination) *BOWEL PROBLEMS (unusual diarrhea, constipation, pain near the anus) TENDERNESS IN MOUTH AND THROAT WITH OR WITHOUT PRESENCE OF ULCERS (sore throat, sores in mouth, or a toothache) UNUSUAL RASH, SWELLING OR PAIN  UNUSUAL VAGINAL DISCHARGE OR ITCHING   Items with * indicate a potential emergency and should be followed up as soon as possible or go to the Emergency Department if any problems should  occur.  Please show the CHEMOTHERAPY ALERT CARD or IMMUNOTHERAPY ALERT CARD at check-in to the Emergency Department and triage nurse.  Should you have questions after your visit or need to cancel or reschedule your appointment, please contact [facility] [phone]  and follow the prompts.  Office hours are 8:00 a.m. to 4:30 p.m. Monday - Friday. Please note that voicemails left after 4:00 p.m. may not be returned until the following business day.  We are closed weekends and major holidays. You have access to a nurse at all times for urgent questions. Please call the main number to the clinic [phone] and follow the prompts.  For any non-urgent questions, you may also contact your provider using MyChart. We now offer e-Visits for anyone 18 and older to request care online for non-urgent symptoms. For details visit [email or web address].   Also download the MyChart app! Go to the app store, search "MyChart", open the app, select [place], and log in with your MyChart username and password.   

## 2023-01-06 NOTE — Progress Notes (Signed)
Patient took her Tylenol and Claritin at home today about 1000am   Treatment given per orders. Patient tolerated it well without problems. Vitals stable and discharged home from clinic ambulatory. Follow up as scheduled.

## 2023-01-16 ENCOUNTER — Ambulatory Visit (HOSPITAL_COMMUNITY)
Admission: RE | Admit: 2023-01-16 | Discharge: 2023-01-16 | Disposition: A | Discharge status: Home / Self Care | Payer: Medicare Other | Attending: Cardiology | Admitting: Cardiology

## 2023-01-16 ENCOUNTER — Encounter (HOSPITAL_COMMUNITY)
Admission: RE | Disposition: A | Discharge status: Home / Self Care | Payer: Self-pay | Source: Home / Self Care | Attending: Cardiology

## 2023-01-16 ENCOUNTER — Other Ambulatory Visit: Payer: Self-pay

## 2023-01-16 DIAGNOSIS — I4892 Unspecified atrial flutter: Secondary | ICD-10-CM | POA: Insufficient documentation

## 2023-01-16 DIAGNOSIS — I7300 Raynaud's syndrome without gangrene: Secondary | ICD-10-CM | POA: Insufficient documentation

## 2023-01-16 DIAGNOSIS — M349 Systemic sclerosis, unspecified: Secondary | ICD-10-CM | POA: Diagnosis not present

## 2023-01-16 DIAGNOSIS — Z7901 Long term (current) use of anticoagulants: Secondary | ICD-10-CM | POA: Insufficient documentation

## 2023-01-16 DIAGNOSIS — I350 Nonrheumatic aortic (valve) stenosis: Secondary | ICD-10-CM | POA: Insufficient documentation

## 2023-01-16 DIAGNOSIS — Z950 Presence of cardiac pacemaker: Secondary | ICD-10-CM | POA: Insufficient documentation

## 2023-01-16 DIAGNOSIS — Z952 Presence of prosthetic heart valve: Secondary | ICD-10-CM | POA: Diagnosis not present

## 2023-01-16 DIAGNOSIS — I442 Atrioventricular block, complete: Secondary | ICD-10-CM | POA: Diagnosis not present

## 2023-01-16 DIAGNOSIS — I272 Pulmonary hypertension, unspecified: Secondary | ICD-10-CM | POA: Insufficient documentation

## 2023-01-16 HISTORY — PX: RIGHT HEART CATH: CATH118263

## 2023-01-16 LAB — POCT I-STAT EG7
Acid-base deficit: 1 mmol/L (ref 0.0–2.0)
Acid-base deficit: 1 mmol/L (ref 0.0–2.0)
Bicarbonate: 22.6 mmol/L (ref 20.0–28.0)
Bicarbonate: 23.1 mmol/L (ref 20.0–28.0)
Calcium, Ion: 1.24 mmol/L (ref 1.15–1.40)
Calcium, Ion: 1.26 mmol/L (ref 1.15–1.40)
HCT: 33 % — ABNORMAL LOW (ref 36.0–46.0)
HCT: 33 % — ABNORMAL LOW (ref 36.0–46.0)
Hemoglobin: 11.2 g/dL — ABNORMAL LOW (ref 12.0–15.0)
Hemoglobin: 11.2 g/dL — ABNORMAL LOW (ref 12.0–15.0)
O2 Saturation: 69 %
O2 Saturation: 72 %
Potassium: 3.8 mmol/L (ref 3.5–5.1)
Potassium: 3.8 mmol/L (ref 3.5–5.1)
Sodium: 140 mmol/L (ref 135–145)
Sodium: 140 mmol/L (ref 135–145)
TCO2: 24 mmol/L (ref 22–32)
TCO2: 24 mmol/L (ref 22–32)
pCO2, Ven: 34.4 mmHg — ABNORMAL LOW (ref 44–60)
pCO2, Ven: 35.2 mmHg — ABNORMAL LOW (ref 44–60)
pH, Ven: 7.425 (ref 7.25–7.43)
pH, Ven: 7.426 (ref 7.25–7.43)
pO2, Ven: 35 mmHg (ref 32–45)
pO2, Ven: 37 mmHg (ref 32–45)

## 2023-01-16 SURGERY — RIGHT HEART CATH
Anesthesia: LOCAL

## 2023-01-16 MED ORDER — LIDOCAINE HCL (PF) 1 % IJ SOLN
INTRAMUSCULAR | Status: AC
  Filled 2023-01-16: qty 30

## 2023-01-16 MED ORDER — SODIUM CHLORIDE 0.9% FLUSH: 3.0000 mL | Freq: Two times a day (BID) | INTRAVENOUS | Status: DC

## 2023-01-16 MED ORDER — MIDAZOLAM HCL 2 MG/2ML IJ SOLN
INTRAMUSCULAR | Status: AC
  Filled 2023-01-16: qty 2

## 2023-01-16 MED ORDER — SODIUM CHLORIDE 0.9% FLUSH: 3.0000 mL | INTRAVENOUS | Status: DC | PRN

## 2023-01-16 MED ORDER — MIDAZOLAM HCL 2 MG/2ML IJ SOLN
INTRAMUSCULAR | Status: DC | PRN
  Administered 2023-01-16: .5 mg via INTRAVENOUS

## 2023-01-16 MED ORDER — LIDOCAINE HCL (PF) 1 % IJ SOLN
INTRAMUSCULAR | Status: DC | PRN
  Administered 2023-01-16: 2 mL

## 2023-01-16 MED ORDER — SODIUM CHLORIDE 0.9 % IV SOLN: 250.0000 mL | INTRAVENOUS | Status: DC | PRN

## 2023-01-16 MED ORDER — SODIUM CHLORIDE 0.9 % IV SOLN: INTRAVENOUS | Status: DC

## 2023-01-16 MED ORDER — HEPARIN (PORCINE) IN NACL 1000-0.9 UT/500ML-% IV SOLN
INTRAVENOUS | Status: DC | PRN
  Administered 2023-01-16: 500 mL

## 2023-01-16 SURGICAL SUPPLY — 6 items
CATH BALLN WEDGE 5F 110CM (CATHETERS) IMPLANT
PACK CARDIAC CATHETERIZATION (CUSTOM PROCEDURE TRAY) ×2 IMPLANT
SHEATH GLIDE SLENDER 4/5FR (SHEATH) IMPLANT
SHEATH PROBE COVER 6X72 (BAG) IMPLANT
TRANSDUCER W/STOPCOCK (MISCELLANEOUS) ×2 IMPLANT
TUBING ART PRESS 72 MALE/FEM (TUBING) IMPLANT

## 2023-01-16 NOTE — Discharge Instructions (Signed)
Activity Rest as told by your health care provider. Return to your normal activities as told by your health care provider. Ask your health care provider what activities are safe for you. May shower and remove bandage 24 hours after discharge. If you were given a sedative during the procedure, it can affect you for several hours. Do not drive or operate machinery until your health care provider says that it is safe. General instructions      Check your IV insertion area every day for signs of infection. Check for: Redness, swelling, or pain. Fluid or blood. Warmth. Pus or a bad smell. Take over-the-counter and prescription medicines only as told by your health care provider. Contact a health care provider if: You have redness, swelling, warmth, pus, or pain around the IV insertion site. 

## 2023-01-16 NOTE — Progress Notes (Signed)
Cath schedule mentioned patient needing ECHO post RHC. No order placed post cath. Sabharwal paged, states no need for ECHO. RN made aware.

## 2023-01-16 NOTE — Interval H&P Note (Signed)
History and Physical Interval Note:  01/16/2023 12:28 PM  Haley Trevino  has presented today for surgery, with the diagnosis of pulmonary htn.  The various methods of treatment have been discussed with the patient and family. After consideration of risks, benefits and other options for treatment, the patient has consented to  Procedure(s): RIGHT HEART CATH (N/A) as a surgical intervention.  The patient's history has been reviewed, patient examined, no change in status, stable for surgery.  I have reviewed the patient's chart and labs.  Questions were answered to the patient's satisfaction.     Leilene Diprima

## 2023-01-19 ENCOUNTER — Encounter (HOSPITAL_COMMUNITY): Payer: Self-pay | Admitting: Cardiology

## 2023-01-20 ENCOUNTER — Encounter (HOSPITAL_COMMUNITY): Payer: Self-pay | Admitting: Cardiology

## 2023-02-09 ENCOUNTER — Other Ambulatory Visit: Payer: Self-pay

## 2023-02-09 ENCOUNTER — Telehealth: Payer: Self-pay

## 2023-02-09 ENCOUNTER — Encounter: Payer: Self-pay | Admitting: Nurse Practitioner

## 2023-02-09 ENCOUNTER — Ambulatory Visit (INDEPENDENT_AMBULATORY_CARE_PROVIDER_SITE_OTHER): Payer: Medicare Other | Admitting: Nurse Practitioner

## 2023-02-09 ENCOUNTER — Ambulatory Visit (INDEPENDENT_AMBULATORY_CARE_PROVIDER_SITE_OTHER): Payer: Medicare Other

## 2023-02-09 VITALS — BP 124/60 | HR 70 | Temp 98.5°F | Resp 20 | Ht 62.0 in | Wt 113.0 lb

## 2023-02-09 DIAGNOSIS — D500 Iron deficiency anemia secondary to blood loss (chronic): Secondary | ICD-10-CM

## 2023-02-09 DIAGNOSIS — F419 Anxiety disorder, unspecified: Secondary | ICD-10-CM | POA: Diagnosis not present

## 2023-02-09 DIAGNOSIS — I110 Hypertensive heart disease with heart failure: Secondary | ICD-10-CM | POA: Diagnosis not present

## 2023-02-09 DIAGNOSIS — F3342 Major depressive disorder, recurrent, in full remission: Secondary | ICD-10-CM

## 2023-02-09 DIAGNOSIS — R300 Dysuria: Secondary | ICD-10-CM

## 2023-02-09 DIAGNOSIS — I5033 Acute on chronic diastolic (congestive) heart failure: Secondary | ICD-10-CM

## 2023-02-09 DIAGNOSIS — K582 Mixed irritable bowel syndrome: Secondary | ICD-10-CM | POA: Diagnosis not present

## 2023-02-09 DIAGNOSIS — M349 Systemic sclerosis, unspecified: Secondary | ICD-10-CM | POA: Diagnosis not present

## 2023-02-09 DIAGNOSIS — K219 Gastro-esophageal reflux disease without esophagitis: Secondary | ICD-10-CM | POA: Diagnosis not present

## 2023-02-09 DIAGNOSIS — E785 Hyperlipidemia, unspecified: Secondary | ICD-10-CM

## 2023-02-09 DIAGNOSIS — E039 Hypothyroidism, unspecified: Secondary | ICD-10-CM

## 2023-02-09 DIAGNOSIS — I7300 Raynaud's syndrome without gangrene: Secondary | ICD-10-CM

## 2023-02-09 DIAGNOSIS — I442 Atrioventricular block, complete: Secondary | ICD-10-CM | POA: Diagnosis not present

## 2023-02-09 DIAGNOSIS — I272 Pulmonary hypertension, unspecified: Secondary | ICD-10-CM | POA: Diagnosis not present

## 2023-02-09 DIAGNOSIS — Z780 Asymptomatic menopausal state: Secondary | ICD-10-CM | POA: Diagnosis not present

## 2023-02-09 DIAGNOSIS — I10 Essential (primary) hypertension: Secondary | ICD-10-CM

## 2023-02-09 DIAGNOSIS — Z79891 Long term (current) use of opiate analgesic: Secondary | ICD-10-CM | POA: Diagnosis not present

## 2023-02-09 DIAGNOSIS — E875 Hyperkalemia: Secondary | ICD-10-CM

## 2023-02-09 LAB — URINALYSIS, COMPLETE
Bilirubin, UA: NEGATIVE
Glucose, UA: NEGATIVE
Ketones, UA: NEGATIVE
Leukocytes,UA: NEGATIVE
Nitrite, UA: NEGATIVE
Protein,UA: NEGATIVE
RBC, UA: NEGATIVE
Specific Gravity, UA: 1.01 (ref 1.005–1.030)
Urobilinogen, Ur: 0.2 mg/dL (ref 0.2–1.0)
pH, UA: 5.5 (ref 5.0–7.5)

## 2023-02-09 MED ORDER — CLONAZEPAM 0.5 MG PO TABS
0.5000 mg | ORAL_TABLET | Freq: Two times a day (BID) | ORAL | 1 refills | Status: DC | PRN
Start: 2023-02-09 — End: 2023-08-10

## 2023-02-09 MED ORDER — FLUOXETINE HCL 40 MG PO CAPS
40.0000 mg | ORAL_CAPSULE | Freq: Every day | ORAL | 1 refills | Status: DC
Start: 2023-02-09 — End: 2023-08-10

## 2023-02-09 MED ORDER — NIFEDIPINE ER OSMOTIC RELEASE 30 MG PO TB24
30.0000 mg | ORAL_TABLET | Freq: Every day | ORAL | 1 refills | Status: DC
Start: 2023-02-09 — End: 2023-05-29

## 2023-02-09 MED ORDER — FUROSEMIDE 20 MG PO TABS: 20.0000 mg | ORAL_TABLET | Freq: Every day | ORAL | 1 refills | Status: DC | PRN

## 2023-02-09 MED ORDER — CYCLOBENZAPRINE HCL 10 MG PO TABS: 10.0000 mg | ORAL_TABLET | Freq: Three times a day (TID) | ORAL | 1 refills | Status: DC | PRN

## 2023-02-09 MED ORDER — SYNTHROID 75 MCG PO TABS
ORAL_TABLET | ORAL | 1 refills | Status: DC
Start: 2023-02-09 — End: 2023-08-10

## 2023-02-09 MED ORDER — SILDENAFIL CITRATE 20 MG PO TABS
20.0000 mg | ORAL_TABLET | Freq: Two times a day (BID) | ORAL | 5 refills | Status: DC
Start: 2023-02-09 — End: 2023-03-30

## 2023-02-09 MED ORDER — EZETIMIBE 10 MG PO TABS
10.0000 mg | ORAL_TABLET | Freq: Every day | ORAL | 1 refills | Status: DC
Start: 2023-02-09 — End: 2023-08-10

## 2023-02-09 MED ORDER — OMEPRAZOLE 20 MG PO CPDR
DELAYED_RELEASE_CAPSULE | ORAL | 1 refills | Status: DC
Start: 2023-02-09 — End: 2023-08-10

## 2023-02-09 NOTE — Telephone Encounter (Signed)
Haley Trevino (Key: [id number]) PA Case ID #: [id number] Rx #: [id number] Need Help? Call us at [phone] Status sent iconSent to Plan today Drug Cyclobenzaprine HCl 10MG  tablets ePA cloud logo Form [facility] Medicare Electronic Prior Authorization Request Form [phone] NCPDP) Original Claim Info [phone] IF LEVEL OF CARE CHANGE CALL HELP DESK

## 2023-02-09 NOTE — Patient Instructions (Signed)
Dysuria Dysuria is pain or discomfort during urination. The pain or discomfort may be felt in the part of the body that drains urine from the bladder (urethra) or in the surrounding tissue of the genitals. The pain may also be felt in the groin area, lower abdomen, or lower back. You may have to urinate frequently or have the sudden feeling that you have to urinate (urgency). Dysuria can affect anyone, but it is more common in females. Dysuria can be caused by many different things, including: Urinary tract infection. Kidney stones or bladder stones. Certain STIs (sexually transmitted infections), such as chlamydia. Dehydration. Inflammation of the tissues of the vagina. Use of certain medicines. Use of certain soaps or scented products that cause irritation. Follow these instructions at home: Medicines Take over-the-counter and prescription medicines only as told by your health care provider. If you were prescribed an antibiotic medicine, take it as told by your health care provider. Do not stop taking the antibiotic even if you start to feel better. Eating and drinking  Drink enough fluid to keep your urine pale yellow. Avoid caffeinated beverages, tea, and alcohol. These beverages can irritate the bladder and make dysuria worse. In males, alcohol may irritate the prostate. General instructions Watch your condition for any changes. Urinate often. Avoid holding urine for long periods of time. If you are female, you should wipe from front to back after urinating or having a bowel movement. Use each piece of toilet paper only once. Empty your bladder after sex. Keep all follow-up visits. This is important. If you had any tests done to find the cause of dysuria, it is up to you to get your test results. Ask your health care provider, or the department that is doing the test, when your results will be ready. Contact a health care provider if: You have a fever. You develop pain in your back or  sides. You have nausea or vomiting. You have blood in your urine. You are not urinating as often as you usually do. Get help right away if: Your pain is severe and not relieved with medicines. You cannot eat or drink without vomiting. You are confused. You have a rapid heartbeat while resting. You have shaking or chills. You feel extremely weak. Summary Dysuria is pain or discomfort while urinating. Many different conditions can lead to dysuria. If you have dysuria, you may have to urinate frequently or have the sudden feeling that you have to urinate (urgency). Watch your condition for any changes. Keep all follow-up visits. Make sure that you urinate often and drink enough fluid to keep your urine pale yellow. This information is not intended to replace advice given to you by your health care provider. Make sure you discuss any questions you have with your health care provider. Document Revised: 02/03/2020 Document Reviewed: 02/03/2020 Elsevier Patient Education  2024 Elsevier Inc.  

## 2023-02-09 NOTE — Progress Notes (Signed)
Subjective:    Patient ID: RIVA WALLI, female    DOB: 1940-08-14, 82 y.o.   MRN: [id number]   Chief Complaint: medical management of chronic issues     HPI:  ALIYAHA AULAKH is a 82 y.o. who identifies as a female who was assigned female at birth.   Social history: Lives with: husband Work history: retired   Water engineer in today for follow up of the following chronic medical issues:  1. Primary hypertension No c/o chest pain or headache. Has SOB all the time. BP Readings from Last 3 Encounters:  01/16/23 130/72  01/06/23 (!) 123/52  12/30/22 (!) 121/56     2. Acute on chronic diastolic heart failure (HCC) 3. Heart block AV complete (HCC) 4. Pulmonary HTN (HCC) Has seen cardiology several times in the last few weeks. She actually had to have heart cath a few weeks ago. Pacemaker is good.  5. [name] without gangrene Her raynauds is worsening but always does better in the summer time.  6. Gastroesophageal reflux disease without esophagitis Is on omperazole and is doing well.  7. Irritable bowel syndrome with both constipation and diarrhea No recent flare ups  8. Acquired hypothyroidism No issues that she is aware of. Lab Results  Component Value Date   TSH 1.460 02/13/2022     9. Hyperlipidemia with target LDL less than 100 Has a very poor appetite and has actually been losing weight. Lab Results  Component Value Date   CHOL 192 08/18/2022   HDL 49 08/18/2022   LDLCALC 116 (H) 08/18/2022   TRIG 153 (H) 08/18/2022   CHOLHDL 3.9 08/18/2022     10. Hyperkalemia Denies any muscle cramps Lab Results  Component Value Date   K 3.8 01/16/2023   K 3.8 01/16/2023     11. Iron deficiency anemia due to chronic blood loss Sees hematology frequently and has to have iron infusions. Lab Results  Component Value Date   HGB 11.2 (L) 01/16/2023   HGB 11.2 (L) 01/16/2023     12. Anxiety Is on klonopin and is doing okay    02/09/2023    2:04  PM 08/18/2022    1:54 PM 02/13/2022    2:12 PM 11/12/2020   12:38 PM  GAD 7 : Generalized Anxiety Score  Nervous, Anxious, on Edge 0 0 0 1  Control/stop worrying 0 0 0 0  Worry too much - different things 0 0 0 0  Trouble relaxing 0 0 0 0  Restless 0 0 0 0  Easily annoyed or irritable 0 0 0 0  Afraid - awful might happen 0 0 0 1  Total GAD 7 Score 0 0 0 2  Anxiety Difficulty Not difficult at all Not difficult at all Not difficult at all Not difficult at all      13. Recurrent major depressive disorder, in full remission (HCC) Has been on prozac for several years. Helps some.     02/09/2023    2:04 PM 08/18/2022    1:53 PM 06/02/2022    2:56 PM  Depression screen PHQ 2/9  Decreased Interest 0 0 0  Down, Depressed, Hopeless 0 0 0  PHQ - 2 Score 0 0 0  Altered sleeping 0 0   Tired, decreased energy 0 0   Change in appetite 0 0   Feeling bad or failure about yourself  0 0   Trouble concentrating 0 0   Moving slowly or fidgety/restless 0 0   Suicidal thoughts  0 0   PHQ-9 Score 0 0   Difficult doing work/chores Not difficult at all Not difficult at all      14. SCLERODERMA Sees rheumatology but is on no specific meds. DR. Anson [place] wants to send her to a scleraderma specialist. Se is also being sent to pain management. Has constant pain bil chest wall and axillia   New complaints: Slight pelvic pressure  Allergies  Allergen Reactions   Bactrim [Sulfamethoxazole-Trimethoprim] Nausea And Vomiting   Dilaudid [Hydromorphone Hcl] Anaphylaxis   Eliquis [Apixaban] Other (See Comments)    Caused bleed   Acyclovir And Related Nausea And Vomiting   Crestor [Rosuvastatin Calcium] Other (See Comments)    Muscle pain   Gabapentin Nausea And Vomiting and Swelling    Swelling in feet and in legs   Aleve [Naproxen Sodium] Rash   Lyrica [Pregabalin] Other (See Comments)    Swelling in feet, and in legs   Outpatient Encounter Medications as of 02/09/2023  Medication Sig   acetaminophen  (TYLENOL) 500 MG tablet Take 1,000 mg by mouth at bedtime.   aspirin EC 81 MG tablet Take 81 mg by mouth daily. Swallow whole.   cephALEXin (KEFLEX) 500 MG capsule Take 1 capsule (500 mg total) by mouth 2 (two) times daily. (Patient not taking: Reported on 01/13/2023)   clonazePAM (KLONOPIN) 0.5 MG tablet Take 1 tablet (0.5 mg total) by mouth 2 (two) times daily as needed for anxiety.   ezetimibe (ZETIA) 10 MG tablet Take 1 tablet (10 mg total) by mouth daily.   FLUoxetine (PROZAC) 40 MG capsule Take 1 capsule (40 mg total) by mouth daily.   fluticasone (FLONASE) 50 MCG/ACT nasal spray Place 1 spray into both nostrils as needed for allergies or rhinitis.   furosemide (LASIX) 20 MG tablet Take 20 mg by mouth daily as needed for edema.   NIFEdipine (PROCARDIA XL/NIFEDICAL XL) 60 MG 24 hr tablet Take 30 mg by mouth daily.   NIFEdipine (PROCARDIA-XL/NIFEDICAL-XL) 30 MG 24 hr tablet Take 30 mg by mouth daily.   omeprazole (PRILOSEC) 20 MG capsule TAKE 1 TABLET DAILY   PROLIA 60 MG/ML SOSY injection Inject 60 mg into the skin every 6 (six) months.   RESTASIS 0.05 % ophthalmic emulsion Place 1 drop into both eyes 2 (two) times daily.   sildenafil (REVATIO) 20 MG tablet Take 20 mg by mouth 2 (two) times daily.   SYNTHROID 75 MCG tablet TAKE (1) TABLET DAILY BE- FORE BREAKFAST.   No facility-administered encounter medications on file as of 02/09/2023.    Past Surgical History:  Procedure Laterality Date   BREAST ENHANCEMENT SURGERY  1975   BREAST IMPLANT REMOVAL Bilateral 04/23/2016   Procedure: REMOVALBILATERAL BREAST IMPLANTS;  Surgeon: Peggye Form, DO;  Location: [facility];  Service: Plastics;  Laterality: Bilateral;   CHOLECYSTECTOMY  1973   ESOPHAGOGASTRODUODENOSCOPY N/A 10/25/2020   Procedure: ESOPHAGOGASTRODUODENOSCOPY (EGD);  Surgeon: Vida Rigger, MD;  Location: [facility];  Service: Endoscopy;  Laterality: N/A;  enteroscopy   HAND SURGERY  08/2012; 11/2012   HOT  HEMOSTASIS N/A 10/25/2020   Procedure: HOT HEMOSTASIS (ARGON PLASMA COAGULATION/BICAP);  Surgeon: Vida Rigger, MD;  Location: [facility];  Service: Endoscopy;  Laterality: N/A;   INTRAOPERATIVE TRANSTHORACIC ECHOCARDIOGRAM N/A 05/13/2022   Procedure: INTRAOPERATIVE TRANSTHORACIC ECHOCARDIOGRAM;  Surgeon: Kathleene Hazel, MD;  Location: [facility];  Service: Open Heart Surgery;  Laterality: N/A;   INTRAOPERATIVE TRANSTHORACIC ECHOCARDIOGRAM N/A 05/20/2022   Procedure: INTRAOPERATIVE TRANSTHORACIC ECHOCARDIOGRAM;  Surgeon: Lynnette Caffey,  Charlies Constable, MD;  Location: [facility];  Service: Open Heart Surgery;  Laterality: N/A;   IR RADIOLOGIST EVAL & MGMT  07/27/2017   IR SACROPLASTY BILATERAL  07/31/2017   IR VERTEBROPLASTY CERV/THOR BX INC UNI/BIL INC/INJECT/IMAGING  03/13/2020   IR VERTEBROPLASTY EA ADDL (T&LS) BX INC UNI/BIL INC INJECT/IMAGING  03/14/2020   MELANOMA EXCISION     at 31 yrs of age   ORIF HIP FRACTURE Right 02/22/2014   Procedure: OPEN REDUCTION INTERNAL FIXATION RIGHT HIP;  Surgeon: Darreld Mclean, MD;  Location: [facility];  Service: Orthopedics;  Laterality: Right;   PACEMAKER IMPLANT N/A 05/27/2022   Procedure: PACEMAKER IMPLANT;  Surgeon: Lanier Prude, MD;  Location: [facility];  Service: Cardiovascular;  Laterality: N/A;   RIGHT HEART CATH N/A 01/16/2023   Procedure: RIGHT HEART CATH;  Surgeon: Dorthula Nettles, DO;  Location: [facility];  Service: Cardiovascular;  Laterality: N/A;   RIGHT HEART CATH AND CORONARY ANGIOGRAPHY N/A 03/28/2022   Procedure: RIGHT HEART CATH AND CORONARY ANGIOGRAPHY;  Surgeon: Orbie Pyo, MD;  Location: [facility];  Service: Cardiovascular;  Laterality: N/A;   TOTAL ABDOMINAL HYSTERECTOMY  1974   TRANSCATHETER AORTIC VALVE REPLACEMENT, TRANSFEMORAL Left 05/13/2022   Procedure: Transcatheter Aortic Valve Replacement, Transfemoral;  Surgeon: Kathleene Hazel, MD;  Location: [facility];  Service: Open Heart  Surgery;  Laterality: Left;   TRANSCATHETER AORTIC VALVE REPLACEMENT, TRANSFEMORAL Left 05/20/2022   Procedure: Transcatheter Aortic Valve Replacement, Transfemoral using an Edwards SAPIEN 3 Ultra 23 MM Heart Valve;  Surgeon: Orbie Pyo, MD;  Location: [facility];  Service: Open Heart Surgery;  Laterality: Left;  Left Transfemoral    Family History  Problem Relation Age of Onset   Pancreatic cancer Father    Raynaud syndrome Father    Cancer Father        pancreatic   Rashes / Skin problems Daughter        possibly scleraderma   Hip fracture Mother    Mental illness Mother        attempted suicide at 64 yo      Controlled substance contract: n/a     Review of Systems  Constitutional:  Negative for diaphoresis.  Eyes:  Negative for pain.  Respiratory:  Negative for shortness of breath.   Cardiovascular:  Negative for chest pain, palpitations and leg swelling.  Gastrointestinal:  Negative for abdominal pain.  Endocrine: Negative for polydipsia.  Skin:  Negative for rash.  Neurological:  Negative for dizziness, weakness and headaches.  Hematological:  Does not bruise/bleed easily.  All other systems reviewed and are negative.      Objective:   Physical Exam Vitals and nursing note reviewed.  Constitutional:      General: She is not in acute distress.    Appearance: Normal appearance. She is well-developed.  HENT:     Head: Normocephalic.     Right Ear: Tympanic membrane normal.     Left Ear: Tympanic membrane normal.     Nose: Nose normal.     Mouth/Throat:     Mouth: Mucous membranes are moist.  Eyes:     Pupils: Pupils are equal, round, and reactive to light.  Neck:     Vascular: No carotid bruit or JVD.  Cardiovascular:     Rate and Rhythm: Normal rate and regular rhythm.     Heart sounds: Normal heart sounds.  Pulmonary:     Effort: Pulmonary effort is normal. No  respiratory distress.     Breath sounds: Normal breath sounds. No wheezing or rales.   Chest:     Chest wall: No tenderness.  Abdominal:     General: Bowel sounds are normal. There is no distension or abdominal bruit.     Palpations: Abdomen is soft. There is no hepatomegaly, splenomegaly, mass or pulsatile mass.     Tenderness: There is no abdominal tenderness.  Musculoskeletal:        General: Normal range of motion.     Cervical back: Normal range of motion and neck supple.  Lymphadenopathy:     Cervical: No cervical adenopathy.  Skin:    General: Skin is warm and dry.     Comments: Paleness and cool to touch finger tips bil.  Neurological:     Mental Status: She is alert and oriented to person, place, and time.     Deep Tendon Reflexes: Reflexes are normal and symmetric.  Psychiatric:        Behavior: Behavior normal.        Thought Content: Thought content normal.        Judgment: Judgment normal.     BP 124/60   Pulse 70   Temp 98.5 F (36.9 C) (Temporal)   Resp 20   Ht 5\' 2"  (1.575 m)   Wt 113 lb (51.3 kg)   SpO2 98%   BMI 20.67 kg/m        Assessment & Plan:   CALISE BUTE comes in today with chief complaint of Medical Management of Chronic Issues   Diagnosis and orders addressed:  1. Primary hypertension Low sodium diet  2. Acute on chronic diastolic heart failure (HCC) 3. Heart block AV complete (HCC) 4. Pulmonary HTN (HCC) Keep follow up with cardiology  5. [name] without gangrene Keep follow  up with rheumatology  6. Gastroesophageal reflux disease without esophagitis Avoid spicy foods Do not eat 2 hours prior to bedtime  - omeprazole (PRILOSEC) 20 MG capsule; TAKE 1 TABLET DAILY  Dispense: 90 capsule; Refill: 1  7. Irritable bowel syndrome with both constipation and diarrhea  8. Acquired hypothyroidism Labs pending - SYNTHROID 75 MCG tablet; TAKE (1) TABLET DAILY BE- FORE BREAKFAST.  Dispense: 90 tablet; Refill: 1  9. Hyperlipidemia with target LDL less than 100 Low fat diet - ezetimibe (ZETIA) 10 MG  tablet; Take 1 tablet (10 mg total) by mouth daily.  Dispense: 90 tablet; Refill: 1  10. Hyperkalemia Labs pending  11. Iron deficiency anemia due to chronic blood loss Labs pending  12. Anxiety Stress management - ToxASSURE Select 13 (MW), Urine - clonazePAM (KLONOPIN) 0.5 MG tablet; Take 1 tablet (0.5 mg total) by mouth 2 (two) times daily as needed for anxiety.  Dispense: 180 tablet; Refill: 1  13. Recurrent major depressive disorder, in full remission (HCC) - FLUoxetine (PROZAC) 40 MG capsule; Take 1 capsule (40 mg total) by mouth daily.  Dispense: 90 capsule; Refill: 1  14. SCLERODERMA Keep follow up with rheumatology Will try some flexeril and see if helps.  15. Dysuria Force fluids Urine clear - Urinalysis, Complete - Urine Culture   Labs pending Health Maintenance reviewed Diet and exercise encouraged  Follow up plan: 6 months   Mary-Margaret Daphine Deutscher, FNP

## 2023-02-10 NOTE — Telephone Encounter (Signed)
Approved. This drug has been approved under the Member's Medicare Part D benefit. Approved quantity: 30 units per 10 day(s). You may fill up to a 90 day supply except for those on Specialty Tier 5, which can be filled up to a 30 day supply. Please call the pharmacy to process the prescription claim.

## 2023-02-11 DIAGNOSIS — Z780 Asymptomatic menopausal state: Secondary | ICD-10-CM | POA: Diagnosis not present

## 2023-02-11 DIAGNOSIS — M810 Age-related osteoporosis without current pathological fracture: Secondary | ICD-10-CM | POA: Diagnosis not present

## 2023-02-17 ENCOUNTER — Encounter
Payer: Medicare Other | Attending: Physical Medicine and Rehabilitation | Admitting: Physical Medicine and Rehabilitation

## 2023-02-25 ENCOUNTER — Ambulatory Visit (INDEPENDENT_AMBULATORY_CARE_PROVIDER_SITE_OTHER): Payer: Medicare Other

## 2023-02-25 DIAGNOSIS — I442 Atrioventricular block, complete: Secondary | ICD-10-CM | POA: Diagnosis not present

## 2023-02-25 LAB — CUP PACEART REMOTE DEVICE CHECK
Battery Remaining Longevity: 122 mo
Battery Remaining Percentage: 95 %
Battery Voltage: 3.01 V
Brady Statistic AP VP Percent: 1 %
Brady Statistic AP VS Percent: 1.3 %
Brady Statistic AS VP Percent: 1 %
Brady Statistic AS VS Percent: 99 %
Brady Statistic RA Percent Paced: 1.2 %
Brady Statistic RV Percent Paced: 1 %
Date Time Interrogation Session: 20240821020027
Implantable Lead Connection Status: 753985
Implantable Lead Connection Status: 753985
Implantable Lead Implant Date: 20231121
Implantable Lead Implant Date: 20231121
Implantable Lead Location: 753859
Implantable Lead Location: 753860
Implantable Pulse Generator Implant Date: 20231121
Lead Channel Impedance Value: 380 Ohm
Lead Channel Impedance Value: 430 Ohm
Lead Channel Pacing Threshold Amplitude: 0.625 V
Lead Channel Pacing Threshold Amplitude: 1.125 V
Lead Channel Pacing Threshold Pulse Width: 0.4 ms
Lead Channel Pacing Threshold Pulse Width: 0.5 ms
Lead Channel Sensing Intrinsic Amplitude: 0.8 mV
Lead Channel Sensing Intrinsic Amplitude: 4.8 mV
Lead Channel Setting Pacing Amplitude: 1.375
Lead Channel Setting Pacing Amplitude: 1.625
Lead Channel Setting Pacing Pulse Width: 0.5 ms
Lead Channel Setting Sensing Sensitivity: 2 mV
Pulse Gen Model: 2272
Pulse Gen Serial Number: 6860174

## 2023-03-04 ENCOUNTER — Inpatient Hospital Stay: Payer: Medicare Other | Attending: Hematology

## 2023-03-06 ENCOUNTER — Inpatient Hospital Stay: Payer: Medicare Other

## 2023-03-10 ENCOUNTER — Inpatient Hospital Stay: Payer: Medicare Other | Attending: Hematology

## 2023-03-10 ENCOUNTER — Telehealth: Payer: Self-pay

## 2023-03-10 DIAGNOSIS — D509 Iron deficiency anemia, unspecified: Secondary | ICD-10-CM | POA: Diagnosis not present

## 2023-03-10 DIAGNOSIS — D500 Iron deficiency anemia secondary to blood loss (chronic): Secondary | ICD-10-CM

## 2023-03-10 LAB — CBC WITH DIFFERENTIAL/PLATELET
Abs Immature Granulocytes: 0.02 10*3/uL (ref 0.00–0.07)
Basophils Absolute: 0.1 10*3/uL (ref 0.0–0.1)
Basophils Relative: 1 %
Eosinophils Absolute: 0.4 10*3/uL (ref 0.0–0.5)
Eosinophils Relative: 6 %
HCT: 32.6 % — ABNORMAL LOW (ref 36.0–46.0)
Hemoglobin: 10.1 g/dL — ABNORMAL LOW (ref 12.0–15.0)
Immature Granulocytes: 0 %
Lymphocytes Relative: 21 %
Lymphs Abs: 1.2 10*3/uL (ref 0.7–4.0)
MCH: 28.5 pg (ref 26.0–34.0)
MCHC: 31 g/dL (ref 30.0–36.0)
MCV: 91.8 fL (ref 80.0–100.0)
Monocytes Absolute: 0.4 10*3/uL (ref 0.1–1.0)
Monocytes Relative: 7 %
Neutro Abs: 3.8 10*3/uL (ref 1.7–7.7)
Neutrophils Relative %: 65 %
Platelets: 268 10*3/uL (ref 150–400)
RBC: 3.55 MIL/uL — ABNORMAL LOW (ref 3.87–5.11)
RDW: 14.3 % (ref 11.5–15.5)
WBC: 5.8 10*3/uL (ref 4.0–10.5)
nRBC: 0 % (ref 0.0–0.2)

## 2023-03-10 LAB — IRON AND TIBC
Iron: 50 ug/dL (ref 28–170)
Saturation Ratios: 14 % (ref 10.4–31.8)
TIBC: 355 ug/dL (ref 250–450)
UIBC: 305 ug/dL

## 2023-03-10 LAB — SAMPLE TO BLOOD BANK

## 2023-03-10 LAB — FERRITIN: Ferritin: 19 ng/mL (ref 11–307)

## 2023-03-10 NOTE — Telephone Encounter (Signed)
Called patient to let her know her hemoglobin, message left on her voicemail.

## 2023-03-10 NOTE — Progress Notes (Signed)
Remote pacemaker transmission.   

## 2023-03-10 NOTE — Progress Notes (Signed)
VIRTUAL VISIT via TELEPHONE NOTE [facility]   I connected with Haley Trevino  on 03/11/23 at  8:22 AM by telephone and verified that I am speaking with the correct person using two identifiers.  Location: Patient: Home Provider: [facility]   I discussed the limitations, risks, security and privacy concerns of performing an evaluation and management service by telephone and the availability of in person appointments. I also discussed with the patient that there may be a patient responsible charge related to this service. The patient expressed understanding and agreed to proceed.  REASON FOR VISIT:  Follow-up for iron deficiency anemia   CURRENT THERAPY: IV iron infusions with intermittent PRBC transfusions  INTERVAL HISTORY:  Haley Trevino is contacted today for follow-up of iron deficiency anemia.  She was last evaluated via telemedicine visit by Rojelio Brenner PA-C 12/30/2022.  At today's visit, she reports feeling poorly due to ongoing fatigue and diffuse musculoskeletal pain from her fibromyalgia.  She also reports vertigo, tremors, general malaise, and headaches.  She has chronic dyspnea on exertion but denies any exertional chest pain.  She remains off of Eliquis to date, but is taking 81 mg aspirin.  She notes occasional dark stool, but denies any gross hematochezia or profuse melena. She had previously lost some weight following TAVR in November 2023, but is now back up to her baseline weight.   She reports little to no energy and 40% appetite.   REVIEW OF SYSTEMS:   Review of Systems  Constitutional:  Positive for malaise/fatigue. Negative for chills, diaphoresis, fever and weight loss.  Respiratory:  Positive for shortness of breath. Negative for cough.   Cardiovascular:  Positive for chest pain (Musculoskeletal chest pain). Negative for palpitations.  Gastrointestinal:  Negative for abdominal pain, blood in stool, melena, nausea  and vomiting.  Musculoskeletal:  Positive for back pain, joint pain, myalgias and neck pain.  Neurological:  Positive for dizziness, tingling, sensory change (itching) and headaches.  Psychiatric/Behavioral:  Positive for depression. The patient is nervous/anxious and has insomnia.     PHYSICAL EXAM: (per limitations of virtual telephone visit)  The patient is alert and oriented x 3, exhibiting adequate mentation, good mood, and ability to speak in full sentences and execute sound judgement.  ASSESSMENT & PLAN:  1.    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 - Was placed on Eliquis for new onset A-fib/flutter in November 2023, but developed profuse melanotic stool within 48 hours of starting Eliquis.  Patient self discontinued her Eliquis and within 24 hours of stopping Eliquis her bowel movements had returned to normal. -Severe anemia with Hgb 6.6 on 06/09/2022, therefore transfused PRBC x 2 units. - She continues to follow with GI (Dr. Ewing Schlein via [facility]) - Most recent IV iron with Venofer 400 mg x 1 on 01/06/2023 - Occasional dark stool without hematochezia or profuse melena -  Labs (03/10/2023): Hgb 10.1/MCV 91.8, ferritin 19, iron saturation 14%.  Previous labs showed normal creatinine/GFR. - Differential  diagnosis favors iron deficiency anemia secondary to chronic GI blood loss; may also be an aspect of malabsorption in the setting of scleroderma, low meat intake, and use of PPI - PLAN: Recommend Venofer 300 mg x 3 - Labs (CBC/D, BB sample, ferritin, iron/TIBC) and RTC in 2 months with PHONE visit   PLAN SUMMARY: >> Venofer 300 mg x 3 >> Labs in 2 months = CBC/D, BB sample, ferritin, iron/TIBC >> PHONE visit in 2 months     I discussed the assessment and treatment plan with the patient. The patient was provided an opportunity to ask questions and all were answered. The patient agreed with the plan and demonstrated an understanding of the instructions.   The patient was advised to call back or seek an in-person evaluation if the symptoms worsen or if the condition fails to improve as anticipated.  I provided 22 minutes of non-face-to-face time during this encounter.  Carnella Guadalajara, PA-C 03/11/23 8:51 AM

## 2023-03-11 ENCOUNTER — Inpatient Hospital Stay (HOSPITAL_BASED_OUTPATIENT_CLINIC_OR_DEPARTMENT_OTHER): Payer: Medicare Other | Admitting: Physician Assistant

## 2023-03-11 DIAGNOSIS — D500 Iron deficiency anemia secondary to blood loss (chronic): Secondary | ICD-10-CM

## 2023-03-16 ENCOUNTER — Other Ambulatory Visit: Payer: Self-pay | Admitting: Nurse Practitioner

## 2023-03-20 ENCOUNTER — Inpatient Hospital Stay: Payer: Medicare Other

## 2023-03-20 VITALS — BP 99/49 | HR 66 | Temp 96.5°F | Resp 18

## 2023-03-20 DIAGNOSIS — D509 Iron deficiency anemia, unspecified: Secondary | ICD-10-CM | POA: Diagnosis not present

## 2023-03-20 DIAGNOSIS — D500 Iron deficiency anemia secondary to blood loss (chronic): Secondary | ICD-10-CM

## 2023-03-20 MED ORDER — SODIUM CHLORIDE 0.9 % IV SOLN: Freq: Once | INTRAVENOUS | Status: AC

## 2023-03-20 MED ORDER — SODIUM CHLORIDE 0.9 % IV SOLN
300.0000 mg | Freq: Once | INTRAVENOUS | Status: AC
  Administered 2023-03-20: 300 mg via INTRAVENOUS
  Filled 2023-03-20: qty 15

## 2023-03-20 MED ORDER — ACETAMINOPHEN 325 MG PO TABS: 650.0000 mg | ORAL_TABLET | Freq: Once | ORAL | Status: DC

## 2023-03-20 MED ORDER — CETIRIZINE HCL 10 MG PO TABS: 5.0000 mg | ORAL_TABLET | Freq: Once | ORAL | Status: DC

## 2023-03-20 NOTE — Progress Notes (Signed)
Per RN took premedication at home.  Pryor Ochoa, PharmD

## 2023-03-20 NOTE — Patient Instructions (Signed)
[facility]  Discharge Instructions: Thank you for choosing [facility] to provide your oncology and hematology care.  If you have a lab appointment with the Cancer Center - please note that after April 8th, 2024, all labs will be drawn in the cancer center.  You do not have to check in or register with the main entrance as you have in the past but will complete your check-in in the cancer center.  Wear comfortable clothing and clothing appropriate for easy access to any Portacath or PICC line.   We strive to give you quality time with your provider. You may need to reschedule your appointment if you arrive late (15 or more minutes).  Arriving late affects you and other patients whose appointments are after yours.  Also, if you miss three or more appointments without notifying the office, you may be dismissed from the clinic at the provider's discretion.      For prescription refill requests, have your pharmacy contact our office and allow 72 hours for refills to be completed.    Today you received the following chemotherapy and/or immunotherapy agents Venofer      To help prevent nausea and vomiting after your treatment, we encourage you to take your nausea medication as directed.  BELOW ARE SYMPTOMS THAT SHOULD BE REPORTED IMMEDIATELY: *FEVER GREATER THAN 100.4 F (38 C) OR HIGHER *CHILLS OR SWEATING *NAUSEA AND VOMITING THAT IS NOT CONTROLLED WITH YOUR NAUSEA MEDICATION *UNUSUAL SHORTNESS OF BREATH *UNUSUAL BRUISING OR BLEEDING *URINARY PROBLEMS (pain or burning when urinating, or frequent urination) *BOWEL PROBLEMS (unusual diarrhea, constipation, pain near the anus) TENDERNESS IN MOUTH AND THROAT WITH OR WITHOUT PRESENCE OF ULCERS (sore throat, sores in mouth, or a toothache) UNUSUAL RASH, SWELLING OR PAIN  UNUSUAL VAGINAL DISCHARGE OR ITCHING   Items with * indicate a potential emergency and should be followed up as soon as possible or go to the  Emergency Department if any problems should occur.  Please show the CHEMOTHERAPY ALERT CARD or IMMUNOTHERAPY ALERT CARD at check-in to the Emergency Department and triage nurse.  Should you have questions after your visit or need to cancel or reschedule your appointment, please contact [facility] [phone]  and follow the prompts.  Office hours are 8:00 a.m. to 4:30 p.m. Monday - Friday. Please note that voicemails left after 4:00 p.m. may not be returned until the following business day.  We are closed weekends and major holidays. You have access to a nurse at all times for urgent questions. Please call the main number to the clinic [phone] and follow the prompts.  For any non-urgent questions, you may also contact your provider using MyChart. We now offer e-Visits for anyone 25 and older to request care online for non-urgent symptoms. For details visit [email or web address].   Also download the MyChart app! Go to the app store, search "MyChart", open the app, select , and log in with your MyChart username and password.

## 2023-03-20 NOTE — Progress Notes (Signed)
Patient presents today for Venofer infusion per providers order.  Vital signs WNL.  Patient as no new complaints at this time.  Patient took premedications at home prior to appointment.  Peripheral IV started and blood return noted pre and post infusion.  Stable during  infusion without adverse affects.  Vital signs stable.  No complaints at this time.  Discharge from clinic ambulatory in stable condition.  Alert and oriented X 3.  Follow up with [facility] as scheduled.

## 2023-03-27 ENCOUNTER — Inpatient Hospital Stay: Payer: Medicare Other

## 2023-03-27 VITALS — BP 128/47 | HR 71 | Temp 97.6°F | Resp 18

## 2023-03-27 DIAGNOSIS — D500 Iron deficiency anemia secondary to blood loss (chronic): Secondary | ICD-10-CM

## 2023-03-27 DIAGNOSIS — D509 Iron deficiency anemia, unspecified: Secondary | ICD-10-CM | POA: Diagnosis not present

## 2023-03-27 MED ORDER — SODIUM CHLORIDE 0.9 % IV SOLN
300.0000 mg | Freq: Once | INTRAVENOUS | Status: AC
  Administered 2023-03-27: 300 mg via INTRAVENOUS
  Filled 2023-03-27: qty 300

## 2023-03-27 MED ORDER — SODIUM CHLORIDE 0.9 % IV SOLN: Freq: Once | INTRAVENOUS | Status: AC

## 2023-03-27 NOTE — Progress Notes (Signed)
Patient took own pre-meds.

## 2023-03-27 NOTE — Patient Instructions (Signed)
[facility]  Discharge Instructions: Thank you for choosing [facility] to provide your oncology and hematology care.  If you have a lab appointment with the Cancer Center - please note that after April 8th, 2024, all labs will be drawn in the cancer center.  You do not have to check in or register with the main entrance as you have in the past but will complete your check-in in the cancer center.  Wear comfortable clothing and clothing appropriate for easy access to any Portacath or PICC line.   We strive to give you quality time with your provider. You may need to reschedule your appointment if you arrive late (15 or more minutes).  Arriving late affects you and other patients whose appointments are after yours.  Also, if you miss three or more appointments without notifying the office, you may be dismissed from the clinic at the provider's discretion.      For prescription refill requests, have your pharmacy contact our office and allow 72 hours for refills to be completed.    Today you received the following chemotherapy and/or immunotherapy agents Venofer. Iron Sucrose Injection What is this medication? IRON SUCROSE (EYE ern SOO krose) treats low levels of iron (iron deficiency anemia) in people with kidney disease. Iron is a mineral that plays an important role in making red blood cells, which carry oxygen from your lungs to the rest of your body. This medicine may be used for other purposes; ask your health care provider or pharmacist if you have questions. COMMON BRAND NAME(S): Venofer What should I tell my care team before I take this medication? They need to know if you have any of these conditions: Anemia not caused by low iron levels Heart disease High levels of iron in the blood Kidney disease Liver disease An unusual or allergic reaction to iron, other medications, foods, dyes, or preservatives Pregnant or trying to get  pregnant Breastfeeding How should I use this medication? This medication is for infusion into a vein. It is given in a hospital or clinic setting. Talk to your care team about the use of this medication in children. While this medication may be prescribed for children as young as 2 years for selected conditions, precautions do apply. Overdosage: If you think you have taken too much of this medicine contact a poison control center or emergency room at once. NOTE: This medicine is only for you. Do not share this medicine with others. What if I miss a dose? Keep appointments for follow-up doses. It is important not to miss your dose. Call your care team if you are unable to keep an appointment. What may interact with this medication? Do not take this medication with any of the following: Deferoxamine Dimercaprol Other iron products This medication may also interact with the following: Chloramphenicol Deferasirox This list may not describe all possible interactions. Give your health care provider a list of all the medicines, herbs, non-prescription drugs, or dietary supplements you use. Also tell them if you smoke, drink alcohol, or use illegal drugs. Some items may interact with your medicine. What should I watch for while using this medication? Visit your care team regularly. Tell your care team if your symptoms do not start to get better or if they get worse. You may need blood work done while you are taking this medication. You may need to follow a special diet. Talk to your care team. Foods that contain iron include: whole grains/cereals, dried  fruits, beans, or peas, leafy green vegetables, and organ meats (liver, kidney). What side effects may I notice from receiving this medication? Side effects that you should report to your care team as soon as possible: Allergic reactions--skin rash, itching, hives, swelling of the face, lips, tongue, or throat Low blood pressure--dizziness, feeling  faint or lightheaded, blurry vision Shortness of breath Side effects that usually do not require medical attention (report to your care team if they continue or are bothersome): Flushing Headache Joint pain Muscle pain Nausea Pain, redness, or irritation at injection site This list may not describe all possible side effects. Call your doctor for medical advice about side effects. You may report side effects to FDA at 1-800-FDA-1088. Where should I keep my medication? This medication is given in a hospital or clinic. It will not be stored at home. NOTE: This sheet is a summary. It may not cover all possible information. If you have questions about this medicine, talk to your doctor, pharmacist, or health care provider.  2024 Elsevier/Gold Standard (2022-11-28 00:00:00)       To help prevent nausea and vomiting after your treatment, we encourage you to take your nausea medication as directed.  BELOW ARE SYMPTOMS THAT SHOULD BE REPORTED IMMEDIATELY: *FEVER GREATER THAN 100.4 F (38 C) OR HIGHER *CHILLS OR SWEATING *NAUSEA AND VOMITING THAT IS NOT CONTROLLED WITH YOUR NAUSEA MEDICATION *UNUSUAL SHORTNESS OF BREATH *UNUSUAL BRUISING OR BLEEDING *URINARY PROBLEMS (pain or burning when urinating, or frequent urination) *BOWEL PROBLEMS (unusual diarrhea, constipation, pain near the anus) TENDERNESS IN MOUTH AND THROAT WITH OR WITHOUT PRESENCE OF ULCERS (sore throat, sores in mouth, or a toothache) UNUSUAL RASH, SWELLING OR PAIN  UNUSUAL VAGINAL DISCHARGE OR ITCHING   Items with * indicate a potential emergency and should be followed up as soon as possible or go to the Emergency Department if any problems should occur.  Please show the CHEMOTHERAPY ALERT CARD or IMMUNOTHERAPY ALERT CARD at check-in to the Emergency Department and triage nurse.  Should you have questions after your visit or need to cancel or reschedule your appointment, please contact [facility]  [phone]  and follow the prompts.  Office hours are 8:00 a.m. to 4:30 p.m. Monday - Friday. Please note that voicemails left after 4:00 p.m. may not be returned until the following business day.  We are closed weekends and major holidays. You have access to a nurse at all times for urgent questions. Please call the main number to the clinic [phone] and follow the prompts.  For any non-urgent questions, you may also contact your provider using MyChart. We now offer e-Visits for anyone 2 and older to request care online for non-urgent symptoms. For details visit [email or web address].   Also download the MyChart app! Go to the app store, search "MyChart", open the app, select [place], and log in with your MyChart username and password.

## 2023-03-27 NOTE — Progress Notes (Signed)
Patient presents today for Venofer 300 mg iron infusion. Vital signs stable. Patient denies any side effects related to her last iron infusion. Patient took 10 mg of Claritin PO and 650 of Tylenol PO at 09:20 am.   Treatment given today per MD orders. Tolerated infusion without adverse affects. Vital signs stable. No complaints at this time. [name] Alert and oriented x 3. F/U with [facility] as scheduled.

## 2023-03-30 ENCOUNTER — Encounter: Payer: Self-pay | Admitting: Physical Medicine and Rehabilitation

## 2023-03-30 ENCOUNTER — Telehealth: Payer: Self-pay

## 2023-03-30 ENCOUNTER — Telehealth: Payer: Self-pay | Admitting: Nurse Practitioner

## 2023-03-30 ENCOUNTER — Encounter
Payer: Medicare Other | Attending: Physical Medicine and Rehabilitation | Admitting: Physical Medicine and Rehabilitation

## 2023-03-30 VITALS — BP 152/72 | HR 77 | Ht 62.0 in | Wt 114.0 lb

## 2023-03-30 DIAGNOSIS — T781XXA Other adverse food reactions, not elsewhere classified, initial encounter: Secondary | ICD-10-CM | POA: Diagnosis not present

## 2023-03-30 DIAGNOSIS — G894 Chronic pain syndrome: Secondary | ICD-10-CM

## 2023-03-30 DIAGNOSIS — Z950 Presence of cardiac pacemaker: Secondary | ICD-10-CM | POA: Diagnosis not present

## 2023-03-30 DIAGNOSIS — S46011S Strain of muscle(s) and tendon(s) of the rotator cuff of right shoulder, sequela: Secondary | ICD-10-CM | POA: Diagnosis not present

## 2023-03-30 DIAGNOSIS — R29898 Other symptoms and signs involving the musculoskeletal system: Secondary | ICD-10-CM

## 2023-03-30 DIAGNOSIS — I7300 Raynaud's syndrome without gangrene: Secondary | ICD-10-CM

## 2023-03-30 DIAGNOSIS — M797 Fibromyalgia: Secondary | ICD-10-CM | POA: Diagnosis not present

## 2023-03-30 MED ORDER — NONFORMULARY OR COMPOUNDED ITEM: 1.0000 mg | Freq: Every evening | 3 refills | Status: DC

## 2023-03-30 MED ORDER — SILDENAFIL CITRATE 20 MG PO TABS
20.0000 mg | ORAL_TABLET | Freq: Two times a day (BID) | ORAL | 5 refills | Status: DC
Start: 2023-03-30 — End: 2023-08-10

## 2023-03-30 NOTE — Telephone Encounter (Signed)
TC to [place] RE: Sildenafil Rx Takes BID #10 is 5 day supply Can this be increased back to a 90-d supply

## 2023-03-30 NOTE — Progress Notes (Signed)
Subjective:    Patient ID: Haley Trevino, female    DOB: [birth year], 82 y.o.   MRN: [id number]  HPI Mrs. Heppler is an 82 year old woman who presents to establish care for chronic pain.  1) Chronic pain: -she has pain everywhere -she was diagnosed with scleroderma and fibromyalgia  2) Left rotator cuff tear -was going to have surgery but it had do be postponed due to need for pacemaker placement   Pain Inventory Average Pain 10 Pain Right Now 10 My pain is intermittent, constant, and sharp  In the last 24 hours, has pain interfered with the following? General activity 10 Relation with others 10 Enjoyment of life 10 What TIME of day is your pain at its worst? morning , daytime, evening, night, and varies Sleep (in general) Fair  Pain is worse with: bending, sitting, and some activites Pain improves with: rest, heat/ice, and medication Relief from Meds: 5  Mobility Walks with out assistance Use a cane Can climb steps  Function Not working Need assistance with meal prep, shopping, household duties  Neuro/Psych  Weakness, tremor, dizziness, depression, anxiety   Family History  Problem Relation Age of Onset   Pancreatic cancer Father    Raynaud syndrome Father    Cancer Father        pancreatic   Rashes / Skin problems Daughter        possibly scleraderma   Hip fracture Mother    Mental illness Mother        attempted suicide at 41 yo   Social History   Socioeconomic History   Marital status: Married    Spouse name: Not on file   Number of children: 2   Years of education: Not on file   Highest education level: Some college, no degree  Occupational History   Occupation: retired Holiday representative at [employer]  Tobacco Use   Smoking status: Never   Smokeless tobacco: Never   Tobacco comments:    passive tobacco smoke exposure as child  Vaping Use   Vaping status: Never Used  Substance and Sexual Activity   Alcohol use: No   Drug use: No    Sexual activity: Not Currently    Birth control/protection: Surgical  Other Topics Concern   Not on file  Social History Narrative   Regular exercise: walkingCaffeine use: 1 cup of coffee daily   Lives in split level home with her husband   Social Determinants of Health   Financial Resource Strain: Low Risk  (06/03/2022)   Overall Financial Resource Strain (CARDIA)    Difficulty of Paying Living Expenses: Not hard at all  Food Insecurity: No Food Insecurity (06/02/2022)   Hunger Vital Sign    Worried About Running Out of Food in the Last Year: Never true    Ran Out of Food in the Last Year: Never true  Transportation Needs: No Transportation Needs (06/03/2022)   PRAPARE - Administrator, Civil Service (Medical): No    Lack of Transportation (Non-Medical): No  Physical Activity: Inactive (06/02/2022)   Exercise Vital Sign    Days of Exercise per Week: 0 days    Minutes of Exercise per Session: 0 min  Stress: No Stress Concern Present (06/02/2022)   [employer] of Occupational Health - Occupational Stress Questionnaire    Feeling of Stress : Only a little  Social Connections: Moderately Integrated (06/02/2022)   Social Connection and Isolation Panel [NHANES]    Frequency of Communication with Friends  and Family: More than three times a week    Frequency of Social Gatherings with Friends and Family: More than three times a week    Attends Religious Services: More than 4 times per year    Active Member of Clubs or Organizations: No    Attends Engineer, structural: Never    Marital Status: Married   Past Surgical History:  Procedure Laterality Date   BREAST ENHANCEMENT SURGERY  1975   BREAST IMPLANT REMOVAL Bilateral 04/23/2016   Procedure: REMOVALBILATERAL BREAST IMPLANTS;  Surgeon: Alena Bills Dillingham, DO;  Location: [facility];  Service: Plastics;  Laterality: Bilateral;   CHOLECYSTECTOMY  1973   ESOPHAGOGASTRODUODENOSCOPY N/A  10/25/2020   Procedure: ESOPHAGOGASTRODUODENOSCOPY (EGD);  Surgeon: Vida Rigger, MD;  Location: [facility];  Service: Endoscopy;  Laterality: N/A;  enteroscopy   HAND SURGERY  08/2012; 11/2012   HOT HEMOSTASIS N/A 10/25/2020   Procedure: HOT HEMOSTASIS (ARGON PLASMA COAGULATION/BICAP);  Surgeon: Vida Rigger, MD;  Location: [facility];  Service: Endoscopy;  Laterality: N/A;   INTRAOPERATIVE TRANSTHORACIC ECHOCARDIOGRAM N/A 05/13/2022   Procedure: INTRAOPERATIVE TRANSTHORACIC ECHOCARDIOGRAM;  Surgeon: Kathleene Hazel, MD;  [facility]: [facility];  Service: Open Heart Surgery;  Laterality: N/A;   INTRAOPERATIVE TRANSTHORACIC ECHOCARDIOGRAM N/A 05/20/2022   Procedure: INTRAOPERATIVE TRANSTHORACIC ECHOCARDIOGRAM;  Surgeon: Orbie Pyo, MD;  Location: [facility] OR;  Service: Open Heart Surgery;  Laterality: N/A;   IR RADIOLOGIST EVAL & MGMT  07/27/2017   IR SACROPLASTY BILATERAL  07/31/2017   IR VERTEBROPLASTY CERV/THOR BX INC UNI/BIL INC/INJECT/IMAGING  03/13/2020   IR VERTEBROPLASTY EA ADDL (T&LS) BX INC UNI/BIL INC INJECT/IMAGING  03/14/2020   MELANOMA EXCISION     at 31 yrs of age   ORIF HIP FRACTURE Right 02/22/2014   Procedure: OPEN REDUCTION INTERNAL FIXATION RIGHT HIP;  Surgeon: Darreld Mclean, MD;  Location: [facility];  Service: Orthopedics;  Laterality: Right;   PACEMAKER IMPLANT N/A 05/27/2022   Procedure: PACEMAKER IMPLANT;  Surgeon: Lanier Prude, MD;  Location: [facility];  Service: Cardiovascular;  Laterality: N/A;   RIGHT HEART CATH N/A 01/16/2023   Procedure: RIGHT HEART CATH;  Surgeon: Dorthula Nettles, DO;  [facility]: [facility];  Service: Cardiovascular;  Laterality: N/A;   RIGHT HEART CATH AND CORONARY ANGIOGRAPHY N/A 03/28/2022   Procedure: RIGHT HEART CATH AND CORONARY ANGIOGRAPHY;  Surgeon: Orbie Pyo, MD;  [facility]: [facility];  Service: Cardiovascular;  Laterality: N/A;   TOTAL ABDOMINAL HYSTERECTOMY  1974   TRANSCATHETER AORTIC VALVE  REPLACEMENT, TRANSFEMORAL Left 05/13/2022   Procedure: Transcatheter Aortic Valve Replacement, Transfemoral;  Surgeon: Kathleene Hazel, MD;  [facility];  Service: Open Heart Surgery;  Laterality: Left;   TRANSCATHETER AORTIC VALVE REPLACEMENT, TRANSFEMORAL Left 05/20/2022   Procedure: Transcatheter Aortic Valve Replacement, Transfemoral using an Edwards SAPIEN 3 Ultra 23 MM Heart Valve;  Surgeon: Orbie Pyo, MD;  Location: [facility];  Service: Open Heart Surgery;  Laterality: Left;  Left Transfemoral   Past Surgical History:  Procedure Laterality Date   BREAST ENHANCEMENT SURGERY  1975   BREAST IMPLANT REMOVAL Bilateral 04/23/2016   Procedure: REMOVALBILATERAL BREAST IMPLANTS;  Surgeon: Peggye Form, DO;  Location: [facility];  Service: Plastics;  Laterality: Bilateral;   CHOLECYSTECTOMY  1973   ESOPHAGOGASTRODUODENOSCOPY N/A 10/25/2020   Procedure: ESOPHAGOGASTRODUODENOSCOPY (EGD);  Surgeon: Vida Rigger, MD;  Location: [facility];  Service: Endoscopy;  Laterality: N/A;  enteroscopy   HAND SURGERY  08/2012; 11/2012   HOT HEMOSTASIS N/A 10/25/2020   Procedure: HOT HEMOSTASIS (ARGON PLASMA COAGULATION/BICAP);  Surgeon: Vida Rigger, MD;  Location: [facility];  Service: Endoscopy;  Laterality: N/A;   INTRAOPERATIVE TRANSTHORACIC ECHOCARDIOGRAM N/A 05/13/2022   Procedure: INTRAOPERATIVE TRANSTHORACIC ECHOCARDIOGRAM;  Surgeon: Kathleene Hazel, MD;  [facility];  Service: Open Heart Surgery;  Laterality: N/A;   INTRAOPERATIVE TRANSTHORACIC ECHOCARDIOGRAM N/A 05/20/2022   Procedure: INTRAOPERATIVE TRANSTHORACIC ECHOCARDIOGRAM;  Surgeon: Orbie Pyo, MD;  Location: [facility] OR;  Service: Open Heart Surgery;  Laterality: N/A;   IR RADIOLOGIST EVAL & MGMT  07/27/2017   IR SACROPLASTY BILATERAL  07/31/2017   IR VERTEBROPLASTY CERV/THOR BX INC UNI/BIL INC/INJECT/IMAGING  03/13/2020   IR VERTEBROPLASTY EA ADDL (T&LS) BX INC UNI/BIL INC  INJECT/IMAGING  03/14/2020   MELANOMA EXCISION     at 31 yrs of age   ORIF HIP FRACTURE Right 02/22/2014   Procedure: OPEN REDUCTION INTERNAL FIXATION RIGHT HIP;  Surgeon: Darreld Mclean, MD;  Location: [facility];  Service: Orthopedics;  Laterality: Right;   PACEMAKER IMPLANT N/A 05/27/2022   Procedure: PACEMAKER IMPLANT;  Surgeon: Lanier Prude, MD;  Location: [facility];  Service: Cardiovascular;  Laterality: N/A;   RIGHT HEART CATH N/A 01/16/2023   Procedure: RIGHT HEART CATH;  Surgeon: Dorthula Nettles, DO;  [facility];  Service: Cardiovascular;  Laterality: N/A;   RIGHT HEART CATH AND CORONARY ANGIOGRAPHY N/A 03/28/2022   Procedure: RIGHT HEART CATH AND CORONARY ANGIOGRAPHY;  Surgeon: Orbie Pyo, MD;  [facility];  Service: Cardiovascular;  Laterality: N/A;   TOTAL ABDOMINAL HYSTERECTOMY  1974   TRANSCATHETER AORTIC VALVE REPLACEMENT, TRANSFEMORAL Left 05/13/2022   Procedure: Transcatheter Aortic Valve Replacement, Transfemoral;  Surgeon: Kathleene Hazel, MD;  [facility];  Service: Open Heart Surgery;  Laterality: Left;   TRANSCATHETER AORTIC VALVE REPLACEMENT, TRANSFEMORAL Left 05/20/2022   Procedure: Transcatheter Aortic Valve Replacement, Transfemoral using an Edwards SAPIEN 3 Ultra 23 MM Heart Valve;  Surgeon: Orbie Pyo, MD;  Location: [facility];  Service: Open Heart Surgery;  Laterality: Left;  Left Transfemoral   Past Medical History:  Diagnosis Date   Cataract    Depression    GERD (gastroesophageal reflux disease)    Hyperlipidemia    Hypertension    Hypothyroidism    IBS (irritable bowel syndrome)    Osteoarthritis    Raynaud disease    S/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 Lynnette Caffey & Bartle   Scleroderma [facility])    Thyroid disease    hypothyroidism   BP (!) 152/72   Pulse 77   Ht 5\' 2"  (1.575 m)   Wt 114 lb  (51.7 kg)   SpO2 94%   BMI 20.85 kg/m   Opioid Risk Score:   Fall Risk Score:  `1  Depression screen [facility] 2/9     03/30/2023   10:34 AM 02/09/2023    2:04 PM 08/18/2022    1:53 PM 06/02/2022    2:56 PM 06/02/2022    2:55 PM 02/13/2022    2:12 PM 06/14/2021   12:59 PM  Depression screen PHQ 2/9  Decreased Interest 2 0 0 0 0 0 1  Down, Depressed, Hopeless 2 0 0 0 0 0 1  PHQ - 2 Score 4 0 0 0 0 0 2  Altered sleeping 2 0 0  0 1  Tired, decreased energy 2 0 0   0 2  Change in appetite 2 0 0   0 2  Feeling bad or failure about yourself  0 0 0   0 0  Trouble concentrating 0 0 0   0 0  Moving slowly or fidgety/restless 0 0 0   0 1  Suicidal thoughts 0 0 0   0 0  PHQ-9 Score 10 0 0   0 8  Difficult doing work/chores  Not difficult at all Not difficult at all   Not difficult at all Somewhat difficult    Review of Systems  Constitutional:  Positive for appetite change.       Poor appetite  Respiratory:  Positive for cough and shortness of breath.   Gastrointestinal:  Positive for nausea.  Neurological:  Positive for dizziness, tremors and weakness.  Psychiatric/Behavioral:         Depression, Anxiety  All other systems reviewed and are negative.      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  -Discussed benefits of exercise in reducing pain. -discussed that exercise can be helpful for fibromyalgia -discussed she does not like pain medicine but sometimes she needs it -referred to PT  -discussed mechanism of action of low dose naltrexone  as an opioid receptor antagonist which stimulates your body's production of its own natural endogenous opioids, helping to decrease pain. Discussed that it can also decrease T cell response and thus be helpful in decreasing inflammation, and symptoms of brain fog, fatigue, anxiety, depression, and allergies. Discussed that this medication needs to be compounded at a compounding pharmacy and can more expensive. Discussed that I usually start at 1mg  and if this is not providing enough relief then I titrate upward on a monthly basis.    -food sensitivity testing ordered -Discussed following foods that may reduce pain: 1) Ginger (especially studied for arthritis)- reduce leukotriene production to decrease inflammation 2) Blueberries- high in phytonutrients that decrease inflammation 3) Salmon- marine omega-3s reduce joint swelling and pain 4) Pumpkin seeds- reduce inflammation 5) dark chocolate- reduces inflammation 6) turmeric- reduces inflammation 7) tart cherries - reduce pain and stiffness 8) extra virgin olive oil - its compound olecanthal helps to block prostaglandins  9) chili peppers- can be eaten or applied topically via capsaicin 10) mint- helpful for headache, muscle aches, joint pain, and itching 11) garlic- reduces inflammation  Link to further information on diet for chronic pain: http://www.bray.com/   Turmeric to reduce inflammation--can be used in cooking or taken as a supplement.  Benefits of turmeric:  -Highly anti-inflammatory  -Increases antioxidants  -Improves memory, attention, brain disease  -Lowers risk of heart disease  -May help prevent cancer  -Decreases pain  -Alleviates depression  -Delays aging and decreases risk of chronic disease  -Consume with black pepper to increase absorption    Turmeric Milk Recipe:  1 cup milk  1 tsp turmeric  1 tsp cinnamon  1 tsp grated ginger  (optional)  Black pepper (boosts the anti-inflammatory properties of turmeric).  1 tsp honey  2) Right rotator cuff tear: -discussed her plan for surgery.  -discussed that she does not want to try physical therapy since it hurts so much  3) Bilateral leg weakness -referred for PT  4) pacemaker: -discussed her recent pacemaker placement

## 2023-03-30 NOTE — Telephone Encounter (Signed)
Haley Trevino said it will be okay. Please send the prescription  to [facility].

## 2023-03-30 NOTE — Addendum Note (Signed)
Addended by: Horton Chin on: 03/30/2023 11:31 AM   Modules accepted: Orders

## 2023-03-30 NOTE — Telephone Encounter (Signed)
Can you please let Mrs. Runk know that her [facility] does not compound low dose naltrexone, and can you please check if she would like me to send it to [facility] instead?

## 2023-03-30 NOTE — Patient Instructions (Addendum)
-  Discussed current symptoms of pain and history of pain.  -Discussed benefits of exercise in reducing pain. -Discussed following foods that may reduce pain: 1) Ginger (especially studied for arthritis)- reduce leukotriene production to decrease inflammation 2) Blueberries- high in phytonutrients that decrease inflammation 3) Salmon- marine omega-3s reduce joint swelling and pain 4) Pumpkin seeds- reduce inflammation 5) dark chocolate- reduces inflammation 6) turmeric- reduces inflammation 7) tart cherries - reduce pain and stiffness 8) extra virgin olive oil - its compound olecanthal helps to block prostaglandins  9) chili peppers- can be eaten or applied topically via capsaicin 10) mint- helpful for headache, muscle aches, joint pain, and itching 11) garlic- reduces inflammation  Link to further information on diet for chronic pain: http://www.bray.com/   Turmeric to reduce inflammation--can be used in cooking or taken as a supplement.  Benefits of turmeric:  -Highly anti-inflammatory  -Increases antioxidants  -Improves memory, attention, brain disease  -Lowers risk of heart disease  -May help prevent cancer  -Decreases pain  -Alleviates depression  -Delays aging and decreases risk of chronic disease  -Consume with black pepper to increase absorption    Turmeric Milk Recipe:  1 cup milk  1 tsp turmeric  1 tsp cinnamon  1 tsp grated ginger (optional)  Black pepper (boosts the anti-inflammatory properties of turmeric).  1 tsp honey

## 2023-03-30 NOTE — Addendum Note (Signed)
Addended by: Bennie Pierini on: 03/30/2023 03:45 PM   Modules accepted: Orders

## 2023-03-31 LAB — ALLERGENS (22) FOODS IGG
Casein IgG: 2.5 ug/mL — ABNORMAL HIGH (ref 0.0–1.9)
Cheese, Mold Type IgG: 9 ug/mL — ABNORMAL HIGH (ref 0.0–1.9)
Chicken IgG: <2 ug/mL (ref 0.0–1.9)
Chocolate/Cacao IgG: 6.3 ug/mL — ABNORMAL HIGH (ref 0.0–1.9)
Coffee IgG: 3.4 ug/mL — ABNORMAL HIGH (ref 0.0–1.9)
Corn IgG: 6.9 ug/mL — ABNORMAL HIGH (ref 0.0–1.9)
Egg, Whole IgG: 2.2 ug/mL — ABNORMAL HIGH (ref 0.0–1.9)
Green Bean IgG: 4.7 ug/mL — ABNORMAL HIGH (ref 0.0–1.9)
Haddock IgG: 2.3 ug/mL — ABNORMAL HIGH (ref 0.0–1.9)
Lamb IgG: 9.9 ug/mL — ABNORMAL HIGH (ref 0.0–1.9)
Oat IgG: 6.6 ug/mL — ABNORMAL HIGH (ref 0.0–1.9)
Onion IgG: 5 ug/mL — ABNORMAL HIGH (ref 0.0–1.9)
Peanut IgG: <2 ug/mL (ref 0.0–1.9)
Pork IgG: 11.1 ug/mL — ABNORMAL HIGH (ref 0.0–1.9)
Potato, White, IgG: 2.9 ug/mL — ABNORMAL HIGH (ref 0.0–1.9)
Rye IgG: 5.3 ug/mL — ABNORMAL HIGH (ref 0.0–1.9)
Shrimp IgG: <2 ug/mL (ref 0.0–1.9)
Soybean IgG: <2 ug/mL (ref 0.0–1.9)
Tomato IgG: 2.6 ug/mL — ABNORMAL HIGH (ref 0.0–1.9)
Wheat IgG: 3.2 ug/mL — ABNORMAL HIGH (ref 0.0–1.9)
Yeast IgG: 10.1 ug/mL — ABNORMAL HIGH (ref 0.0–1.9)

## 2023-03-31 NOTE — Telephone Encounter (Signed)
Task completed.  Patient informed.

## 2023-04-03 ENCOUNTER — Inpatient Hospital Stay: Payer: Medicare Other

## 2023-04-03 VITALS — BP 130/52 | HR 77 | Temp 98.6°F | Resp 16

## 2023-04-03 DIAGNOSIS — D509 Iron deficiency anemia, unspecified: Secondary | ICD-10-CM | POA: Diagnosis not present

## 2023-04-03 DIAGNOSIS — D500 Iron deficiency anemia secondary to blood loss (chronic): Secondary | ICD-10-CM

## 2023-04-03 MED ORDER — SODIUM CHLORIDE 0.9 % IV SOLN
300.0000 mg | Freq: Once | INTRAVENOUS | Status: AC
  Administered 2023-04-03: 300 mg via INTRAVENOUS
  Filled 2023-04-03: qty 300

## 2023-04-03 MED ORDER — SODIUM CHLORIDE 0.9 % IV SOLN: Freq: Once | INTRAVENOUS | Status: AC

## 2023-04-03 NOTE — Progress Notes (Signed)
Venofer iron infusion given per orders. Patient tolerated it well without problems. Vitals stable and discharged home from clinic ambulatory. Follow up as scheduled.

## 2023-04-03 NOTE — Progress Notes (Signed)
Patient presents today for iron infusion.  Patient is in satisfactory condition with no new complaints voiced.  Vital signs are stable.  IV placed in L arm.  IV flushed well with good blood return noted.   Tylenol and Zyrtec taken at 1200 at home prior to visit.  We will proceed with infusion per provider orders.

## 2023-04-03 NOTE — Patient Instructions (Signed)
[facility]  Discharge Instructions: Thank you for choosing [facility] to provide your oncology and hematology care.  If you have a lab appointment with the Cancer Center - please note that after April 8th, 2024, all labs will be drawn in the cancer center.  You do not have to check in or register with the main entrance as you have in the past but will complete your check-in in the cancer center.  Wear comfortable clothing and clothing appropriate for easy access to any Portacath or PICC line.   We strive to give you quality time with your provider. You may need to reschedule your appointment if you arrive late (15 or more minutes).  Arriving late affects you and other patients whose appointments are after yours.  Also, if you miss three or more appointments without notifying the office, you may be dismissed from the clinic at the provider's discretion.      For prescription refill requests, have your pharmacy contact our office and allow 72 hours for refills to be completed.    Today you received the following:  Venofer.  Iron Sucrose Injection What is this medication? IRON SUCROSE (EYE ern SOO krose) treats low levels of iron (iron deficiency anemia) in people with kidney disease. Iron is a mineral that plays an important role in making red blood cells, which carry oxygen from your lungs to the rest of your body. This medicine may be used for other purposes; ask your health care provider or pharmacist if you have questions. COMMON BRAND NAME(S): Venofer What should I tell my care team before I take this medication? They need to know if you have any of these conditions: Anemia not caused by low iron levels Heart disease High levels of iron in the blood Kidney disease Liver disease An unusual or allergic reaction to iron, other medications, foods, dyes, or preservatives Pregnant or trying to get pregnant Breastfeeding How should I use this  medication? This medication is for infusion into a vein. It is given in a hospital or clinic setting. Talk to your care team about the use of this medication in children. While this medication may be prescribed for children as young as 2 years for selected conditions, precautions do apply. Overdosage: If you think you have taken too much of this medicine contact a poison control center or emergency room at once. NOTE: This medicine is only for you. Do not share this medicine with others. What if I miss a dose? Keep appointments for follow-up doses. It is important not to miss your dose. Call your care team if you are unable to keep an appointment. What may interact with this medication? Do not take this medication with any of the following: Deferoxamine Dimercaprol Other iron products This medication may also interact with the following: Chloramphenicol Deferasirox This list may not describe all possible interactions. Give your health care provider a list of all the medicines, herbs, non-prescription drugs, or dietary supplements you use. Also tell them if you smoke, drink alcohol, or use illegal drugs. Some items may interact with your medicine. What should I watch for while using this medication? Visit your care team regularly. Tell your care team if your symptoms do not start to get better or if they get worse. You may need blood work done while you are taking this medication. You may need to follow a special diet. Talk to your care team. Foods that contain iron include: whole grains/cereals, dried fruits, beans,  or peas, leafy green vegetables, and organ meats (liver, kidney). What side effects may I notice from receiving this medication? Side effects that you should report to your care team as soon as possible: Allergic reactions--skin rash, itching, hives, swelling of the face, lips, tongue, or throat Low blood pressure--dizziness, feeling faint or lightheaded, blurry vision Shortness of  breath Side effects that usually do not require medical attention (report to your care team if they continue or are bothersome): Flushing Headache Joint pain Muscle pain Nausea Pain, redness, or irritation at injection site This list may not describe all possible side effects. Call your doctor for medical advice about side effects. You may report side effects to FDA at 1-800-FDA-1088. Where should I keep my medication? This medication is given in a hospital or clinic. It will not be stored at home. NOTE: This sheet is a summary. It may not cover all possible information. If you have questions about this medicine, talk to your doctor, pharmacist, or health care provider.  2024 Elsevier/Gold Standard (2022-11-28 00:00:00)     To help prevent nausea and vomiting after your treatment, we encourage you to take your nausea medication as directed.  BELOW ARE SYMPTOMS THAT SHOULD BE REPORTED IMMEDIATELY: *FEVER GREATER THAN 100.4 F (38 C) OR HIGHER *CHILLS OR SWEATING *NAUSEA AND VOMITING THAT IS NOT CONTROLLED WITH YOUR NAUSEA MEDICATION *UNUSUAL SHORTNESS OF BREATH *UNUSUAL BRUISING OR BLEEDING *URINARY PROBLEMS (pain or burning when urinating, or frequent urination) *BOWEL PROBLEMS (unusual diarrhea, constipation, pain near the anus) TENDERNESS IN MOUTH AND THROAT WITH OR WITHOUT PRESENCE OF ULCERS (sore throat, sores in mouth, or a toothache) UNUSUAL RASH, SWELLING OR PAIN  UNUSUAL VAGINAL DISCHARGE OR ITCHING   Items with * indicate a potential emergency and should be followed up as soon as possible or go to the Emergency Department if any problems should occur.  Please show the CHEMOTHERAPY ALERT CARD or IMMUNOTHERAPY ALERT CARD at check-in to the Emergency Department and triage nurse.  Should you have questions after your visit or need to cancel or reschedule your appointment, please contact [facility] [phone]  and follow the prompts.  Office hours are  8:00 a.m. to 4:30 p.m. Monday - Friday. Please note that voicemails left after 4:00 p.m. may not be returned until the following business day.  We are closed weekends and major holidays. You have access to a nurse at all times for urgent questions. Please call the main number to the clinic [phone] and follow the prompts.  For any non-urgent questions, you may also contact your provider using MyChart. We now offer e-Visits for anyone 55 and older to request care online for non-urgent symptoms. For details visit [email or web address].   Also download the MyChart app! Go to the app store, search "MyChart", open the app, select [facility], and log in with your MyChart username and password.

## 2023-04-06 ENCOUNTER — Encounter (HOSPITAL_BASED_OUTPATIENT_CLINIC_OR_DEPARTMENT_OTHER): Payer: Medicare Other | Admitting: Physical Medicine and Rehabilitation

## 2023-04-06 DIAGNOSIS — T781XXA Other adverse food reactions, not elsewhere classified, initial encounter: Secondary | ICD-10-CM

## 2023-04-06 NOTE — Progress Notes (Signed)
Subjective:    Patient ID: Haley Trevino, female    DOB: [birth year], 82 y.o.   MRN: [id number]  HPI Mrs. Keitt is an 82 year old woman who presents to establish care for chronic pain.  1) Chronic pain: -she has pain everywhere -she was diagnosed with scleroderma and fibromyalgia  2) Left rotator cuff tear -was going to have surgery but it had do be postponed due to need for pacemaker placement  3) IBS: -symptoms have improved -avoids fried foods   Pain Inventory Average Pain 10 Pain Right Now 10 My pain is intermittent, constant, and sharp  In the last 24 hours, has pain interfered with the following? General activity 10 Relation with others 10 Enjoyment of life 10 What TIME of day is your pain at its worst? morning , daytime, evening, night, and varies Sleep (in general) Fair  Pain is worse with: bending, sitting, and some activites Pain improves with: rest, heat/ice, and medication Relief from Meds: 5  Mobility Walks with out assistance Use a cane Can climb steps  Function Not working Need assistance with meal prep, shopping, household duties  Neuro/Psych  Weakness, tremor, dizziness, depression, anxiety   Family History  Problem Relation Age of Onset   Pancreatic cancer Father    Raynaud syndrome Father    Cancer Father        pancreatic   Rashes / Skin problems Daughter        possibly scleraderma   Hip fracture Mother    Mental illness Mother        attempted suicide at 16 yo   Social History   Socioeconomic History   Marital status: Married    Spouse name: Not on file   Number of children: 2   Years of education: Not on file   Highest education level: Some college, no degree  Occupational History   Occupation: retired Holiday representative at [employer]  Tobacco Use   Smoking status: Never   Smokeless tobacco: Never   Tobacco comments:    passive tobacco smoke exposure as child  Vaping Use   Vaping status: Never Used  Substance and  Sexual Activity   Alcohol use: No   Drug use: No   Sexual activity: Not Currently    Birth control/protection: Surgical  Other Topics Concern   Not on file  Social History Narrative   Regular exercise: walkingCaffeine use: 1 cup of coffee daily   Lives in split level home with her husband   Social Determinants of Health   Financial Resource Strain: Low Risk  (06/03/2022)   Overall Financial Resource Strain (CARDIA)    Difficulty of Paying Living Expenses: Not hard at all  Food Insecurity: No Food Insecurity (06/02/2022)   Hunger Vital Sign    Worried About Running Out of Food in the Last Year: Never true    Ran Out of Food in the Last Year: Never true  Transportation Needs: No Transportation Needs (06/03/2022)   PRAPARE - Administrator, Civil Service (Medical): No    Lack of Transportation (Non-Medical): No  Physical Activity: Inactive (06/02/2022)   Exercise Vital Sign    Days of Exercise per Week: 0 days    Minutes of Exercise per Session: 0 min  Stress: No Stress Concern Present (06/02/2022)   [employer] of Occupational Health - Occupational Stress Questionnaire    Feeling of Stress : Only a little  Social Connections: Moderately Integrated (06/02/2022)   Social Connection and Isolation Panel [  NHANES]    Frequency of Communication with Friends and Family: More than three times a week    Frequency of Social Gatherings with Friends and Family: More than three times a week    Attends Religious Services: More than 4 times per year    Active Member of Clubs or Organizations: No    Attends Engineer, structural: Never    Marital Status: Married   Past Surgical History:  Procedure Laterality Date   BREAST ENHANCEMENT SURGERY  1975   BREAST IMPLANT REMOVAL Bilateral 04/23/2016   Procedure: REMOVALBILATERAL BREAST IMPLANTS;  Surgeon: Alena Bills Dillingham, DO;  Location: [facility];  Service: Plastics;  Laterality: Bilateral;    CHOLECYSTECTOMY  1973   ESOPHAGOGASTRODUODENOSCOPY N/A 10/25/2020   Procedure: ESOPHAGOGASTRODUODENOSCOPY (EGD);  Surgeon: Vida Rigger, MD;  Location: [facility];  Service: Endoscopy;  Laterality: N/A;  enteroscopy   HAND SURGERY  08/2012; 11/2012   HOT HEMOSTASIS N/A 10/25/2020   Procedure: HOT HEMOSTASIS (ARGON PLASMA COAGULATION/BICAP);  Surgeon: Vida Rigger, MD;  Location: [facility];  Service: Endoscopy;  Laterality: N/A;   INTRAOPERATIVE TRANSTHORACIC ECHOCARDIOGRAM N/A 05/13/2022   Procedure: INTRAOPERATIVE TRANSTHORACIC ECHOCARDIOGRAM;  Surgeon: Kathleene Hazel, MD;  Location: [facility];  Service: Open Heart Surgery;  Laterality: N/A;   INTRAOPERATIVE TRANSTHORACIC ECHOCARDIOGRAM N/A 05/20/2022   Procedure: INTRAOPERATIVE TRANSTHORACIC ECHOCARDIOGRAM;  Surgeon: Orbie Pyo, MD;  Location: [facility];  Service: Open Heart Surgery;  Laterality: N/A;   IR RADIOLOGIST EVAL & MGMT  07/27/2017   IR SACROPLASTY BILATERAL  07/31/2017   IR VERTEBROPLASTY CERV/THOR BX INC UNI/BIL INC/INJECT/IMAGING  03/13/2020   IR VERTEBROPLASTY EA ADDL (T&LS) BX INC UNI/BIL INC INJECT/IMAGING  03/14/2020   MELANOMA EXCISION     at 31 yrs of age   ORIF HIP FRACTURE Right 02/22/2014   Procedure: OPEN REDUCTION INTERNAL FIXATION RIGHT HIP;  Surgeon: Darreld Mclean, MD;  Location: [facility];  Service: Orthopedics;  Laterality: Right;   PACEMAKER IMPLANT N/A 05/27/2022   Procedure: PACEMAKER IMPLANT;  Surgeon: Lanier Prude, MD;  [facility];  Service: Cardiovascular;  Laterality: N/A;   RIGHT HEART CATH N/A 01/16/2023   Procedure: RIGHT HEART CATH;  Surgeon: Dorthula Nettles, DO;  Location: [facility];  Service: Cardiovascular;  Laterality: N/A;   RIGHT HEART CATH AND CORONARY ANGIOGRAPHY N/A 03/28/2022   Procedure: RIGHT HEART CATH AND CORONARY ANGIOGRAPHY;  Surgeon: Orbie Pyo, MD;  Location: [facility];  Service: Cardiovascular;  Laterality: N/A;   TOTAL  ABDOMINAL HYSTERECTOMY  1974   TRANSCATHETER AORTIC VALVE REPLACEMENT, TRANSFEMORAL Left 05/13/2022   Procedure: Transcatheter Aortic Valve Replacement, Transfemoral;  Surgeon: Kathleene Hazel, MD;  Location: [facility];  Service: Open Heart Surgery;  Laterality: Left;   TRANSCATHETER AORTIC VALVE REPLACEMENT, TRANSFEMORAL Left 05/20/2022   Procedure: Transcatheter Aortic Valve Replacement, Transfemoral using an Edwards SAPIEN 3 Ultra 23 MM Heart Valve;  Surgeon: Orbie Pyo, MD;  Location: [facility];  Service: Open Heart Surgery;  Laterality: Left;  Left Transfemoral   Past Surgical History:  Procedure Laterality Date   BREAST ENHANCEMENT SURGERY  1975   BREAST IMPLANT REMOVAL Bilateral 04/23/2016   Procedure: REMOVALBILATERAL BREAST IMPLANTS;  Surgeon: Peggye Form, DO;  Location: [facility];  Service: Plastics;  Laterality: Bilateral;   CHOLECYSTECTOMY  1973   ESOPHAGOGASTRODUODENOSCOPY N/A 10/25/2020   Procedure: ESOPHAGOGASTRODUODENOSCOPY (EGD);  Surgeon: Vida Rigger, MD;  Location: [facility];  Service: Endoscopy;  Laterality: N/A;  enteroscopy   HAND SURGERY  08/2012; 11/2012   HOT HEMOSTASIS N/A 10/25/2020   Procedure: HOT HEMOSTASIS (ARGON PLASMA COAGULATION/BICAP);  Surgeon: Vida Rigger, MD;  Location: [facility];  Service: Endoscopy;  Laterality: N/A;   INTRAOPERATIVE TRANSTHORACIC ECHOCARDIOGRAM N/A 05/13/2022   Procedure: INTRAOPERATIVE TRANSTHORACIC ECHOCARDIOGRAM;  Surgeon: Kathleene Hazel, MD;  [facility];  Service: Open Heart Surgery;  Laterality: N/A;   INTRAOPERATIVE TRANSTHORACIC ECHOCARDIOGRAM N/A 05/20/2022   Procedure: INTRAOPERATIVE TRANSTHORACIC ECHOCARDIOGRAM;  Surgeon: Orbie Pyo, MD;  Location[facility];  Service: Open Heart Surgery;  Laterality: N/A;   IR RADIOLOGIST EVAL & MGMT  07/27/2017   IR SACROPLASTY BILATERAL  07/31/2017   IR VERTEBROPLASTY CERV/THOR BX INC UNI/BIL INC/INJECT/IMAGING  03/13/2020    IR VERTEBROPLASTY EA ADDL (T&LS) BX INC UNI/BIL INC INJECT/IMAGING  03/14/2020   MELANOMA EXCISION     at 31 yrs of age   ORIF HIP FRACTURE Right 02/22/2014   Procedure: OPEN REDUCTION INTERNAL FIXATION RIGHT HIP;  Surgeon: Darreld Mclean, MD;  Location: [facility];  Service: Orthopedics;  Laterality: Right;   PACEMAKER IMPLANT N/A 05/27/2022   Procedure: PACEMAKER IMPLANT;  Surgeon: Lanier Prude, MD;  [facility]: [facility] CV LAB;  Service: Cardiovascular;  Laterality: N/A;   RIGHT HEART CATH N/A 01/16/2023   Procedure: RIGHT HEART CATH;  Surgeon: Dorthula Nettles, DO;  Location: [facility];  Service: Cardiovascular;  Laterality: N/A;   RIGHT HEART CATH AND CORONARY ANGIOGRAPHY N/A 03/28/2022   Procedure: RIGHT HEART CATH AND CORONARY ANGIOGRAPHY;  Surgeon: Orbie Pyo, MD;  Location: [facility];  Service: Cardiovascular;  Laterality: N/A;   TOTAL ABDOMINAL HYSTERECTOMY  1974   TRANSCATHETER AORTIC VALVE REPLACEMENT, TRANSFEMORAL Left 05/13/2022   Procedure: Transcatheter Aortic Valve Replacement, Transfemoral;  Surgeon: Kathleene Hazel, MD;  Location: [facility];  Service: Open Heart Surgery;  Laterality: Left;   TRANSCATHETER AORTIC VALVE REPLACEMENT, TRANSFEMORAL Left 05/20/2022   Procedure: Transcatheter Aortic Valve Replacement, Transfemoral using an Edwards SAPIEN 3 Ultra 23 MM Heart Valve;  Surgeon: Orbie Pyo, MD;  Location: [facility];  Service: Open Heart Surgery;  Laterality: Left;  Left Transfemoral   Past Medical History:  Diagnosis Date   Cataract    Depression    GERD (gastroesophageal reflux disease)    Hyperlipidemia    Hypertension    Hypothyroidism    IBS (irritable bowel syndrome)    Osteoarthritis    Raynaud disease    S/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 Lynnette Caffey & Bartle   Scleroderma [facility])    Thyroid disease    hypothyroidism   There  were no vitals taken for this visit.  Opioid Risk Score:   Fall Risk Score:  `1  Depression screen [facility] 2/9     03/30/2023   10:34 AM 02/09/2023    2:04 PM 08/18/2022    1:53 PM 06/02/2022    2:56 PM 06/02/2022    2:55 PM 02/13/2022    2:12 PM 06/14/2021   12:59 PM  Depression screen [facility] 2/9  Decreased Interest 2 0 0 0 0 0 1  Down, Depressed, Hopeless 2 0 0 0 0 0 1  PHQ - 2 Score 4 0 0 0 0 0 2  Altered sleeping 2 0 0   0 1  Tired, decreased energy 2 0 0   0 2  Change in appetite 2 0 0   0 2  Feeling bad or failure about yourself  0 0 0   0 0  Trouble concentrating 0 0 0   0 0  Moving slowly or fidgety/restless 0 0 0   0 1  Suicidal thoughts 0 0 0   0 0  PHQ-9 Score 10 0 0   0 8  Difficult doing work/chores  Not difficult at all Not difficult at all   Not difficult at all Somewhat difficult    Review of Systems  Constitutional:  Positive for appetite change.       Poor appetite  Respiratory:  Positive for cough and shortness of breath.   Gastrointestinal:  Positive for nausea.  Neurological:  Positive for dizziness, tremors and weakness.  Psychiatric/Behavioral:         Depression, Anxiety  All other systems reviewed and are negative.      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  -Discussed benefits of exercise in reducing pain. -discussed that exercise can be helpful for fibromyalgia -discussed she does not like pain medicine but sometimes she needs it -referred to PT  -discussed mechanism of action of low dose  naltrexone as an opioid receptor antagonist which stimulates your body's production of its own natural endogenous opioids, helping to decrease pain. Discussed that it can also decrease T cell response and thus be helpful in decreasing inflammation, and symptoms of brain fog, fatigue, anxiety, depression, and allergies. Discussed that this medication needs to be compounded at a compounding pharmacy and can more expensive. Discussed that I usually start at 1mg  and if this is not providing enough relief then I titrate upward on a monthly basis.    -food sensitivity testing ordered -Discussed following foods that may reduce pain: 1) Ginger (especially studied for arthritis)- reduce leukotriene production to decrease inflammation 2) Blueberries- high in phytonutrients that decrease inflammation 3) Salmon- marine omega-3s reduce joint swelling and pain 4) Pumpkin seeds- reduce inflammation 5) dark chocolate- reduces inflammation 6) turmeric- reduces inflammation 7) tart cherries - reduce pain and stiffness 8) extra virgin olive oil - its compound olecanthal helps to block prostaglandins  9) chili peppers- can be eaten or applied topically via capsaicin 10) mint- helpful for headache, muscle aches, joint pain, and itching 11) garlic- reduces inflammation  Link to further information on diet for chronic pain: http://www.bray.com/   Turmeric to reduce inflammation--can be used in cooking or taken as a supplement.  Benefits of turmeric:  -Highly anti-inflammatory  -Increases antioxidants  -Improves memory, attention, brain disease  -Lowers risk of heart disease  -May help prevent cancer  -Decreases pain  -Alleviates depression  -Delays aging and decreases risk of chronic disease  -Consume with black pepper to increase absorption    Turmeric Milk Recipe:  1 cup milk  1 tsp turmeric  1 tsp cinnamon  1 tsp  grated ginger (optional)  Black pepper (boosts the anti-inflammatory properties of turmeric).  1 tsp honey    2) Right rotator cuff tear: -discussed her plan for surgery.  -discussed  that she does not want to try physical therapy since it hurts so much  3) Bilateral leg weakness -referred for PT  4) pacemaker: -discussed her recent pacemaker placement  5) Food sensitivity: -discussed her food sensitivities and recommended avoiding wheat, dairy, and corn, discussed that she likes mac and cheese -discussed her history of IBS  19 minutes spent in discussion of her food sensitivity testing, answering her questions, recommended avoiding soda, discussed that she doesn't like water, recommended green tea instead of soda, discussed her response to low dose naltrexone, discussed her rehab.

## 2023-04-13 ENCOUNTER — Ambulatory Visit: Payer: Medicare Other | Attending: Physical Medicine and Rehabilitation

## 2023-04-13 ENCOUNTER — Other Ambulatory Visit: Payer: Self-pay

## 2023-04-13 DIAGNOSIS — M6281 Muscle weakness (generalized): Secondary | ICD-10-CM | POA: Diagnosis not present

## 2023-04-13 DIAGNOSIS — R29898 Other symptoms and signs involving the musculoskeletal system: Secondary | ICD-10-CM | POA: Diagnosis not present

## 2023-04-13 NOTE — Therapy (Signed)
OUTPATIENT PHYSICAL THERAPY LOWER EXTREMITY EVALUATION   Patient Name: Haley Trevino MRN: [id number] DOB:09/24/1940, 82 y.o., female Today's Date: 04/13/2023  END OF SESSION:  PT End of Session - 04/13/23 1341     Visit Number 1    Number of Visits 8    Date for PT Re-Evaluation 06/05/23    PT Start Time 1342    PT Stop Time 1424    PT Time Calculation (min) 42 min    Activity Tolerance Patient tolerated treatment well    Behavior During Therapy [facility] for tasks assessed/performed             Past Medical History:  Diagnosis Date   Cataract    Depression    GERD (gastroesophageal reflux disease)    Hyperlipidemia    Hypertension    Hypothyroidism    IBS (irritable bowel syndrome)    Osteoarthritis    Raynaud disease    S/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 Lynnette Caffey & Bartle   Scleroderma [facility])    Thyroid disease    hypothyroidism   Past Surgical History:  Procedure Laterality Date   BREAST ENHANCEMENT SURGERY  1975   BREAST IMPLANT REMOVAL Bilateral 04/23/2016   Procedure: REMOVALBILATERAL BREAST IMPLANTS;  Surgeon: Peggye Form, DO;  Location: [facility];  Service: Plastics;  Laterality: Bilateral;   CHOLECYSTECTOMY  1973   ESOPHAGOGASTRODUODENOSCOPY N/A 10/25/2020   Procedure: ESOPHAGOGASTRODUODENOSCOPY (EGD);  Surgeon: Vida Rigger, MD;  Location: [facility];  Service: Endoscopy;  Laterality: N/A;  enteroscopy   HAND SURGERY  08/2012; 11/2012   HOT HEMOSTASIS N/A 10/25/2020   Procedure: HOT HEMOSTASIS (ARGON PLASMA COAGULATION/BICAP);  Surgeon: Vida Rigger, MD;  Location: [facility];  Service: Endoscopy;  Laterality: N/A;   INTRAOPERATIVE TRANSTHORACIC ECHOCARDIOGRAM N/A 05/13/2022   Procedure: INTRAOPERATIVE TRANSTHORACIC ECHOCARDIOGRAM;  Surgeon: Kathleene Hazel, MD;  [facility]: [facility];  Service: Open Heart Surgery;  Laterality:  N/A;   INTRAOPERATIVE TRANSTHORACIC ECHOCARDIOGRAM N/A 05/20/2022   Procedure: INTRAOPERATIVE TRANSTHORACIC ECHOCARDIOGRAM;  Surgeon: Orbie Pyo, MD;  Location: [facility];  Service: Open Heart Surgery;  Laterality: N/A;   IR RADIOLOGIST EVAL & MGMT  07/27/2017   IR SACROPLASTY BILATERAL  07/31/2017   IR VERTEBROPLASTY CERV/THOR BX INC UNI/BIL INC/INJECT/IMAGING  03/13/2020   IR VERTEBROPLASTY EA ADDL (T&LS) BX INC UNI/BIL INC INJECT/IMAGING  03/14/2020   MELANOMA EXCISION     at 31 yrs of age   ORIF HIP FRACTURE Right 02/22/2014   Procedure: OPEN REDUCTION INTERNAL FIXATION RIGHT HIP;  Surgeon: Darreld Mclean, MD;  Location: [facility];  Service: Orthopedics;  Laterality: Right;   PACEMAKER IMPLANT N/A 05/27/2022   Procedure: PACEMAKER IMPLANT;  Surgeon: Lanier Prude, MD;  Location: [facility] INVASIVE CV LAB;  Service: Cardiovascular;  Laterality: N/A;   RIGHT HEART CATH N/A 01/16/2023   Procedure: RIGHT HEART CATH;  Surgeon: Dorthula Nettles, DO;  Location: [facility];  Service: Cardiovascular;  Laterality: N/A;   RIGHT HEART CATH AND CORONARY ANGIOGRAPHY N/A 03/28/2022   Procedure: RIGHT HEART CATH AND CORONARY ANGIOGRAPHY;  Surgeon: Orbie Pyo, MD;  Location: [facility];  Service: Cardiovascular;  Laterality: N/A;   TOTAL ABDOMINAL HYSTERECTOMY  1974   TRANSCATHETER AORTIC VALVE REPLACEMENT, TRANSFEMORAL Left 05/13/2022   Procedure: Transcatheter Aortic Valve Replacement, Transfemoral;  Surgeon: Kathleene Hazel, MD;  [facility]: [facility];  Service: Open  Heart Surgery;  Laterality: Left;   TRANSCATHETER AORTIC VALVE REPLACEMENT, TRANSFEMORAL Left 05/20/2022   Procedure: Transcatheter Aortic Valve Replacement, Transfemoral using an Edwards SAPIEN 3 Ultra 23 MM Heart Valve;  Surgeon: Orbie Pyo, MD;  Location: [facility];  Service: Open Heart Surgery;  Laterality: Left;  Left Transfemoral   Patient Active Problem List   Diagnosis Date Noted   Pulmonary HTN (HCC)  12/18/2022   Heart block AV complete (HCC) 05/27/2022   S/P TAVR (transcatheter aortic valve replacement) 05/20/2022   Anxiety 05/14/2022   Hyperkalemia 05/14/2022   Acute on chronic diastolic heart failure (HCC) 05/13/2022   Acute respiratory distress 05/13/2022   Nonrheumatic aortic valve stenosis 04/28/2022   Precordial chest pain 03/13/2022   History of adenomatous polyp of colon 09/21/2020   Weight loss 09/21/2020   Irritable bowel syndrome 09/21/2020   Breast implant rupture 09/04/2020   Iron deficiency anemia due to chronic blood loss 08/28/2020   DOE (dyspnea on exertion) 11/18/2017   Murmur 11/18/2017   Impingement syndrome of left shoulder region 12/08/2014   Osteoporosis 09/20/2014   Hip fracture requiring operative repair (HCC) 02/22/2014   Hypothyroidism 11/29/2013   Depression 11/29/2013   GERD (gastroesophageal reflux disease) 11/29/2013   Hypertension 11/29/2013   Hyperlipidemia with target LDL less than 100 11/29/2013   Thyroid cyst 04/26/2013   Melanoma of skin (HCC) 08/07/2010   [name] 08/06/2010   SCLERODERMA 08/06/2010   Osteoarthritis 08/06/2010    PCP: Bennie Pierini, FNP  REFERRING PROVIDER: Horton Chin, MD   REFERRING DIAG: Bilateral leg weakness   THERAPY DIAG:  Muscle weakness (generalized)  Rationale for Evaluation and Treatment: Rehabilitation  ONSET DATE: November 2023  SUBJECTIVE:   SUBJECTIVE STATEMENT: Patient reports that her legs feel weak, unsteady, and "wobbly" and that has been going on for a while. She had a heart valve replacement in November 2023. She was diagnosed with congestive heart failure. She then had to have another surgery to implant a pacemaker. She had to be admitted to ICU for multiple days after each of her surgeries. She felt that she did really good after her surgeries, but she was hurting all over. She has not fallen, but she feels unsteady.   PERTINENT HISTORY: Allergies, Raynaud's  disease, hypertension, heart failure, osteoarthritis, osteoporosis, depression, anxiety, and torn left rotator cuff PAIN:  Are you having pain? Yes: NPRS scale: 5/10 Pain location: right hip  Pain description: ache and sore  PRECAUTIONS: ICD/Pacemaker  RED FLAGS: None   WEIGHT BEARING RESTRICTIONS: No  FALLS:  Has patient fallen in last 6 months? No  LIVING ENVIRONMENT: Lives with: lives with their spouse Lives in: House/apartment Stairs: Yes: Internal: 7 steps; on right going up; step to pattern Has following equipment at home: None  OCCUPATION: retired   PLOF: Independent  PATIENT GOALS: improved strength, be able to go shopping, and be more steady when standing and walking  NEXT MD VISIT: 06/29/23  OBJECTIVE:  Note: Objective measures were completed at Evaluation unless otherwise noted.  COGNITION: Overall cognitive status: Within functional limits for tasks assessed     SENSATION: Patient reports no numbness or tingling in her feet or lower extremities.   EDEMA:  No edema observed   POSTURE: rounded shoulders and forward head  LOWER EXTREMITY ROM: WFL for activities assessed   LOWER EXTREMITY MMT:  MMT Right eval Left eval  Hip flexion 3/5 3+/5  Hip extension    Hip abduction    Hip adduction  Hip internal rotation    Hip external rotation    Knee flexion 3/5 3+/5  Knee extension 4/5 4/5  Ankle dorsiflexion 3/5 3+/5  Ankle plantarflexion    Ankle inversion    Ankle eversion     (Blank rows = not tested)  FUNCTIONAL TESTS:  5 times sit to stand: 15.48 seconds without upper extremity support Timed up and go (TUG): 11.39 seconds  BALANCE:  Narrow BOS, eyes open, firm surface: 30 seconds  Narrow BOS, eyes closed, firm surface: 30 seconds with increased sway  Tandem (right foot leading), eyes open, firm surface: 14 seconds  Tandem (left foot leading), eyes open, firm surface: 14 seconds   GAIT: Assistive device utilized: None Level of  assistance: Complete Independence Comments: No significant gait deviations observed   TODAY'S TREATMENT:                                                                                                                              DATE:     PATIENT EDUCATION:  Education details: Plan of care, prognosis, healing, and goals for therapy Person educated: Patient Education method: Explanation Education comprehension: verbalized understanding  HOME EXERCISE PROGRAM:   ASSESSMENT:  CLINICAL IMPRESSION: Patient is a 82 y.o. female who was seen today for physical therapy evaluation and treatment for bilateral lower extremity weakness.  She exhibited reduced bilateral lower extremity muscular strength as evidenced by her manual muscle testing.  She also exhibited reduced lower extremity power as evidenced by her 5 times sit to stand test.  Recommend that she continue with skilled physical therapy to address her impairments to maximize her functional mobility and safety.  OBJECTIVE IMPAIRMENTS: decreased activity tolerance, decreased balance, decreased endurance, decreased mobility, difficulty walking, decreased strength, postural dysfunction, and pain.   ACTIVITY LIMITATIONS: standing, stairs, and locomotion level  PARTICIPATION LIMITATIONS: meal prep, cleaning, shopping, and community activity  PERSONAL FACTORS: Past/current experiences, Time since onset of injury/illness/exacerbation, and 3+ comorbidities: Allergies, [name], hypertension, heart failure, osteoarthritis, osteoporosis, depression, anxiety, and torn left rotator cuff  are also affecting patient's functional outcome.   REHAB POTENTIAL: Good  CLINICAL DECISION MAKING: Evolving/moderate complexity  EVALUATION COMPLEXITY: Moderate   GOALS: Goals reviewed with patient? Yes  LONG TERM GOALS: Target date: 05/11/23  Patient will be independent with her HEP. Baseline:  Goal status: INITIAL  2.  Patient will be able  to stand for at least 30 minutes for improved function cooking. Baseline:  Goal status: INITIAL  3.  Patient will improve her 5 times sit to stand time to 12 seconds or less for improved lower extremity power. Baseline:  Goal status: INITIAL  4.  Patient will be able to navigate at least 4 steps with a reciprocal pattern for improved household mobility. Baseline:  Goal status: INITIAL  PLAN:  PT FREQUENCY: 2x/week  PT DURATION: 4 weeks  PLANNED INTERVENTIONS: Therapeutic exercises, Therapeutic activity, Neuromuscular re-education, Balance training, Gait training, Patient/Family education, Self Care,  Stair training, Cryotherapy, Moist heat, and Re-evaluation  PLAN FOR NEXT SESSION: NuStep, lower extremity strengthening, and balance interventions   Granville Lewis, PT 04/13/2023, 2:55 PM

## 2023-04-15 ENCOUNTER — Ambulatory Visit: Payer: Medicare Other

## 2023-04-15 DIAGNOSIS — R29898 Other symptoms and signs involving the musculoskeletal system: Secondary | ICD-10-CM | POA: Diagnosis not present

## 2023-04-15 DIAGNOSIS — M6281 Muscle weakness (generalized): Secondary | ICD-10-CM | POA: Diagnosis not present

## 2023-04-15 NOTE — Therapy (Signed)
OUTPATIENT PHYSICAL THERAPY LOWER EXTREMITY TREATMENT   Patient Name: Haley Trevino MRN: [id number] DOB:[birth year], 82 y.o., female Today's Date: 04/15/2023  END OF SESSION:  PT End of Session - 04/15/23 1348     Visit Number 2    Number of Visits 8    Date for PT Re-Evaluation 06/05/23    PT Start Time 1345    PT Stop Time 1430    PT Time Calculation (min) 45 min    Activity Tolerance Patient tolerated treatment well    Behavior During Therapy [facility] for tasks assessed/performed             Past Medical History:  Diagnosis Date   Cataract    Depression    GERD (gastroesophageal reflux disease)    Hyperlipidemia    Hypertension    Hypothyroidism    IBS (irritable bowel syndrome)    Osteoarthritis    Raynaud disease    S/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 Lynnette Caffey & Bartle   Scleroderma [facility])    Thyroid disease    hypothyroidism   Past Surgical History:  Procedure Laterality Date   BREAST ENHANCEMENT SURGERY  1975   BREAST IMPLANT REMOVAL Bilateral 04/23/2016   Procedure: REMOVALBILATERAL BREAST IMPLANTS;  Surgeon: Peggye Form, DO;  Location: [facility];  Service: Plastics;  Laterality: Bilateral;   CHOLECYSTECTOMY  1973   ESOPHAGOGASTRODUODENOSCOPY N/A 10/25/2020   Procedure: ESOPHAGOGASTRODUODENOSCOPY (EGD);  Surgeon: Vida Rigger, MD;  Location: [facility];  Service: Endoscopy;  Laterality: N/A;  enteroscopy   HAND SURGERY  08/2012; 11/2012   HOT HEMOSTASIS N/A 10/25/2020   Procedure: HOT HEMOSTASIS (ARGON PLASMA COAGULATION/BICAP);  Surgeon: Vida Rigger, MD;  Location: [facility];  Service: Endoscopy;  Laterality: N/A;   INTRAOPERATIVE TRANSTHORACIC ECHOCARDIOGRAM N/A 05/13/2022   Procedure: INTRAOPERATIVE TRANSTHORACIC ECHOCARDIOGRAM;  Surgeon: Kathleene Hazel, MD;  [facility]: [facility];  Service: Open Heart Surgery;  Laterality: N/A;    INTRAOPERATIVE TRANSTHORACIC ECHOCARDIOGRAM N/A 05/20/2022   Procedure: INTRAOPERATIVE TRANSTHORACIC ECHOCARDIOGRAM;  Surgeon: Orbie Pyo, MD;  Location: [facility] OR;  Service: Open Heart Surgery;  Laterality: N/A;   IR RADIOLOGIST EVAL & MGMT  07/27/2017   IR SACROPLASTY BILATERAL  07/31/2017   IR VERTEBROPLASTY CERV/THOR BX INC UNI/BIL INC/INJECT/IMAGING  03/13/2020   IR VERTEBROPLASTY EA ADDL (T&LS) BX INC UNI/BIL INC INJECT/IMAGING  03/14/2020   MELANOMA EXCISION     at 31 yrs of age   ORIF HIP FRACTURE Right 02/22/2014   Procedure: OPEN REDUCTION INTERNAL FIXATION RIGHT HIP;  Surgeon: Darreld Mclean, MD;  Location: [facility];  Service: Orthopedics;  Laterality: Right;   PACEMAKER IMPLANT N/A 05/27/2022   Procedure: PACEMAKER IMPLANT;  Surgeon: Lanier Prude, MD;  Location: [facility] INVASIVE CV LAB;  Service: Cardiovascular;  Laterality: N/A;   RIGHT HEART CATH N/A 01/16/2023   Procedure: RIGHT HEART CATH;  Surgeon: Dorthula Nettles, DO;  Location: [facility];  Service: Cardiovascular;  Laterality: N/A;   RIGHT HEART CATH AND CORONARY ANGIOGRAPHY N/A 03/28/2022   Procedure: RIGHT HEART CATH AND CORONARY ANGIOGRAPHY;  Surgeon: Orbie Pyo, MD;  Location: [facility];  Service: Cardiovascular;  Laterality: N/A;   TOTAL ABDOMINAL HYSTERECTOMY  1974   TRANSCATHETER AORTIC VALVE REPLACEMENT, TRANSFEMORAL Left 05/13/2022   Procedure: Transcatheter Aortic Valve Replacement, Transfemoral;  Surgeon: Kathleene Hazel, MD;  Location: [facility];  Service: Open  Heart Surgery;  Laterality: Left;   TRANSCATHETER AORTIC VALVE REPLACEMENT, TRANSFEMORAL Left 05/20/2022   Procedure: Transcatheter Aortic Valve Replacement, Transfemoral using an Edwards SAPIEN 3 Ultra 23 MM Heart Valve;  Surgeon: Orbie Pyo, MD;  Location: [facility];  Service: Open Heart Surgery;  Laterality: Left;  Left Transfemoral   Patient Active Problem List   Diagnosis Date Noted   Pulmonary HTN (HCC)  12/18/2022   Heart block AV complete (HCC) 05/27/2022   S/P TAVR (transcatheter aortic valve replacement) 05/20/2022   Anxiety 05/14/2022   Hyperkalemia 05/14/2022   Acute on chronic diastolic heart failure (HCC) 05/13/2022   Acute respiratory distress 05/13/2022   Nonrheumatic aortic valve stenosis 04/28/2022   Precordial chest pain 03/13/2022   History of adenomatous polyp of colon 09/21/2020   Weight loss 09/21/2020   Irritable bowel syndrome 09/21/2020   Breast implant rupture 09/04/2020   Iron deficiency anemia due to chronic blood loss 08/28/2020   DOE (dyspnea on exertion) 11/18/2017   Murmur 11/18/2017   Impingement syndrome of left shoulder region 12/08/2014   Osteoporosis 09/20/2014   Hip fracture requiring operative repair (HCC) 02/22/2014   Hypothyroidism 11/29/2013   Depression 11/29/2013   GERD (gastroesophageal reflux disease) 11/29/2013   Hypertension 11/29/2013   Hyperlipidemia with target LDL less than 100 11/29/2013   Thyroid cyst 04/26/2013   Melanoma of skin (HCC) 08/07/2010   [name] 08/06/2010   SCLERODERMA 08/06/2010   Osteoarthritis 08/06/2010    PCP: Bennie Pierini, FNP  REFERRING PROVIDER: Horton Chin, MD   REFERRING DIAG: Bilateral leg weakness   THERAPY DIAG:  Muscle weakness (generalized)  Rationale for Evaluation and Treatment: Rehabilitation  ONSET DATE: November 2023  SUBJECTIVE:   SUBJECTIVE STATEMENT: Pt reports increased pain right hip pain today due to doing things around the house today.    PERTINENT HISTORY: Allergies, [name], hypertension, heart failure, osteoarthritis, osteoporosis, depression, anxiety, and torn left rotator cuff PAIN:  Are you having pain? Yes: NPRS scale: 7/10 Pain location: right hip  Pain description: ache and sore  PRECAUTIONS: ICD/Pacemaker  RED FLAGS: None   WEIGHT BEARING RESTRICTIONS: No  FALLS:  Has patient fallen in last 6 months? No  LIVING  ENVIRONMENT: Lives with: lives with their spouse Lives in: House/apartment Stairs: Yes: Internal: 7 steps; on right going up; step to pattern Has following equipment at home: None  OCCUPATION: retired   PLOF: Independent  PATIENT GOALS: improved strength, be able to go shopping, and be more steady when standing and walking  NEXT MD VISIT: 06/29/23  OBJECTIVE:  Note: Objective measures were completed at Evaluation unless otherwise noted.  COGNITION: Overall cognitive status: Within functional limits for tasks assessed     SENSATION: Patient reports no numbness or tingling in her feet or lower extremities.   EDEMA:  No edema observed   POSTURE: rounded shoulders and forward head  LOWER EXTREMITY ROM: WFL for activities assessed   LOWER EXTREMITY MMT:  MMT Right eval Left eval  Hip flexion 3/5 3+/5  Hip extension    Hip abduction    Hip adduction    Hip internal rotation    Hip external rotation    Knee flexion 3/5 3+/5  Knee extension 4/5 4/5  Ankle dorsiflexion 3/5 3+/5  Ankle plantarflexion    Ankle inversion    Ankle eversion     (Blank rows = not tested)  FUNCTIONAL TESTS:  5 times sit to stand: 15.48 seconds without upper extremity support Timed up and go (  TUG): 11.39 seconds  BALANCE:  Narrow BOS, eyes open, firm surface: 30 seconds  Narrow BOS, eyes closed, firm surface: 30 seconds with increased sway  Tandem (right foot leading), eyes open, firm surface: 14 seconds  Tandem (left foot leading), eyes open, firm surface: 14 seconds   GAIT: Assistive device utilized: None Level of assistance: Complete Independence Comments: No significant gait deviations observed   TODAY'S TREATMENT:                                                                                                                              DATE: 04/15/23                                    EXERCISE LOG  Exercise Repetitions and Resistance Comments  Nustep Lvl 2 x 20 mins   LAQs  2# x 2.5 mins   Seated Marches 2# x 2.5 mins   Seated Hip Abduction Red 2.5 mins   Seated Hip Adduction 2.5 mins   Seated Ham Curls 20 reps bil   STS X10 reps    Blank cell = exercise not performed today   PATIENT EDUCATION:  Education details: Plan of care, prognosis, healing, and goals for therapy Person educated: Patient Education method: Explanation Education comprehension: verbalized understanding  HOME EXERCISE PROGRAM:   ASSESSMENT:  CLINICAL IMPRESSION: Pt arrives for today's treatment session reporting 7/10 right hip pain.  Pt introduced to several seated BLE exercises today to increase strength and function.  Pt requiring min cues for proper technique with all newly added exercises.  Pt given seated rest breaks as needed for fatigue.  Pt requiring BUE support with all STS transfers.  Pt denied any change in hip pain at completion of today's treatment session.  OBJECTIVE IMPAIRMENTS: decreased activity tolerance, decreased balance, decreased endurance, decreased mobility, difficulty walking, decreased strength, postural dysfunction, and pain.   ACTIVITY LIMITATIONS: standing, stairs, and locomotion level  PARTICIPATION LIMITATIONS: meal prep, cleaning, shopping, and community activity  PERSONAL FACTORS: Past/current experiences, Time since onset of injury/illness/exacerbation, and 3+ comorbidities: Allergies, [name], hypertension, heart failure, osteoarthritis, osteoporosis, depression, anxiety, and torn left rotator cuff  are also affecting patient's functional outcome.   REHAB POTENTIAL: Good  CLINICAL DECISION MAKING: Evolving/moderate complexity  EVALUATION COMPLEXITY: Moderate   GOALS: Goals reviewed with patient? Yes  LONG TERM GOALS: Target date: 05/11/23  Patient will be independent with her HEP. Baseline:  Goal status: INITIAL  2.  Patient will be able to stand for at least 30 minutes for improved function cooking. Baseline:  Goal status:  INITIAL  3.  Patient will improve her 5 times sit to stand time to 12 seconds or less for improved lower extremity power. Baseline:  Goal status: INITIAL  4.  Patient will be able to navigate at least 4 steps with a reciprocal pattern for improved household mobility. Baseline:  Goal status: INITIAL  PLAN:  PT FREQUENCY: 2x/week  PT DURATION: 4 weeks  PLANNED INTERVENTIONS: Therapeutic exercises, Therapeutic activity, Neuromuscular re-education, Balance training, Gait training, Patient/Family education, Self Care, Stair training, Cryotherapy, Moist heat, and Re-evaluation  PLAN FOR NEXT SESSION: NuStep, lower extremity strengthening, and balance interventions   Newman Pies, PTA 04/15/2023, 2:37 PM

## 2023-04-28 ENCOUNTER — Ambulatory Visit: Payer: Medicare Other | Admitting: Physical Therapy

## 2023-04-28 DIAGNOSIS — R29898 Other symptoms and signs involving the musculoskeletal system: Secondary | ICD-10-CM | POA: Diagnosis not present

## 2023-04-28 DIAGNOSIS — M6281 Muscle weakness (generalized): Secondary | ICD-10-CM | POA: Diagnosis not present

## 2023-04-28 NOTE — Therapy (Signed)
OUTPATIENT PHYSICAL THERAPY LOWER EXTREMITY TREATMENT   Patient Name: Haley Trevino MRN: [id number] DOB:1940-08-19, 82 y.o., female Today's Date: 04/28/2023  END OF SESSION:  PT End of Session - 04/28/23 1348     Visit Number 3    Number of Visits 8    Date for PT Re-Evaluation 06/05/23    PT Start Time 0145    PT Stop Time 0220    PT Time Calculation (min) 35 min    Activity Tolerance Patient tolerated treatment well    Behavior During Therapy [facility] for tasks assessed/performed             Past Medical History:  Diagnosis Date   Cataract    Depression    GERD (gastroesophageal reflux disease)    Hyperlipidemia    Hypertension    Hypothyroidism    IBS (irritable bowel syndrome)    Osteoarthritis    Raynaud disease    S/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 Lynnette Caffey & Bartle   Scleroderma [facility])    Thyroid disease    hypothyroidism   Past Surgical History:  Procedure Laterality Date   BREAST ENHANCEMENT SURGERY  1975   BREAST IMPLANT REMOVAL Bilateral 04/23/2016   Procedure: REMOVALBILATERAL BREAST IMPLANTS;  Surgeon: Peggye Form, DO;  Location: [facility];  Service: Plastics;  Laterality: Bilateral;   CHOLECYSTECTOMY  1973   ESOPHAGOGASTRODUODENOSCOPY N/A 10/25/2020   Procedure: ESOPHAGOGASTRODUODENOSCOPY (EGD);  Surgeon: Vida Rigger, MD;  Location: [facility];  Service: Endoscopy;  Laterality: N/A;  enteroscopy   HAND SURGERY  08/2012; 11/2012   HOT HEMOSTASIS N/A 10/25/2020   Procedure: HOT HEMOSTASIS (ARGON PLASMA COAGULATION/BICAP);  Surgeon: Vida Rigger, MD;  Location: [facility];  Service: Endoscopy;  Laterality: N/A;   INTRAOPERATIVE TRANSTHORACIC ECHOCARDIOGRAM N/A 05/13/2022   Procedure: INTRAOPERATIVE TRANSTHORACIC ECHOCARDIOGRAM;  Surgeon: Kathleene Hazel, MD;  [facility];  Service: Open Heart Surgery;  Laterality:  N/A;   INTRAOPERATIVE TRANSTHORACIC ECHOCARDIOGRAM N/A 05/20/2022   Procedure: INTRAOPERATIVE TRANSTHORACIC ECHOCARDIOGRAM;  Surgeon: Orbie Pyo, MD;  Location: [facility];  Service: Open Heart Surgery;  Laterality: N/A;   IR RADIOLOGIST EVAL & MGMT  07/27/2017   IR SACROPLASTY BILATERAL  07/31/2017   IR VERTEBROPLASTY CERV/THOR BX INC UNI/BIL INC/INJECT/IMAGING  03/13/2020   IR VERTEBROPLASTY EA ADDL (T&LS) BX INC UNI/BIL INC INJECT/IMAGING  03/14/2020   MELANOMA EXCISION     at 31 yrs of age   ORIF HIP FRACTURE Right 02/22/2014   Procedure: OPEN REDUCTION INTERNAL FIXATION RIGHT HIP;  Surgeon: Darreld Mclean, MD;  Location: [facility];  Service: Orthopedics;  Laterality: Right;   PACEMAKER IMPLANT N/A 05/27/2022   Procedure: PACEMAKER IMPLANT;  Surgeon: Lanier Prude, MD;  Location: [facility] INVASIVE CV LAB;  Service: Cardiovascular;  Laterality: N/A;   RIGHT HEART CATH N/A 01/16/2023   Procedure: RIGHT HEART CATH;  Surgeon: Dorthula Nettles, DO;  [facility];  Service: Cardiovascular;  Laterality: N/A;   RIGHT HEART CATH AND CORONARY ANGIOGRAPHY N/A 03/28/2022   Procedure: RIGHT HEART CATH AND CORONARY ANGIOGRAPHY;  Surgeon: Orbie Pyo, MD;  [facility];  Service: Cardiovascular;  Laterality: N/A;   TOTAL ABDOMINAL HYSTERECTOMY  1974   TRANSCATHETER AORTIC VALVE REPLACEMENT, TRANSFEMORAL Left 05/13/2022   Procedure: Transcatheter Aortic Valve Replacement, Transfemoral;  Surgeon: Kathleene Hazel, MD;  [facility];  Service: Open  Heart Surgery;  Laterality: Left;   TRANSCATHETER AORTIC VALVE REPLACEMENT, TRANSFEMORAL Left 05/20/2022   Procedure: Transcatheter Aortic Valve Replacement, Transfemoral using an Edwards SAPIEN 3 Ultra 23 MM Heart Valve;  Surgeon: Orbie Pyo, MD;  [facility];  Service: Open Heart Surgery;  Laterality: Left;  Left Transfemoral   Patient Active Problem List   Diagnosis Date Noted   Pulmonary HTN (HCC)  12/18/2022   Heart block AV complete (HCC) 05/27/2022   S/P TAVR (transcatheter aortic valve replacement) 05/20/2022   Anxiety 05/14/2022   Hyperkalemia 05/14/2022   Acute on chronic diastolic heart failure (HCC) 05/13/2022   Acute respiratory distress 05/13/2022   Nonrheumatic aortic valve stenosis 04/28/2022   Precordial chest pain 03/13/2022   History of adenomatous polyp of colon 09/21/2020   Weight loss 09/21/2020   Irritable bowel syndrome 09/21/2020   Breast implant rupture 09/04/2020   Iron deficiency anemia due to chronic blood loss 08/28/2020   DOE (dyspnea on exertion) 11/18/2017   Murmur 11/18/2017   Impingement syndrome of left shoulder region 12/08/2014   Osteoporosis 09/20/2014   Hip fracture requiring operative repair (HCC) 02/22/2014   Hypothyroidism 11/29/2013   Depression 11/29/2013   GERD (gastroesophageal reflux disease) 11/29/2013   Hypertension 11/29/2013   Hyperlipidemia with target LDL less than 100 11/29/2013   Thyroid cyst 04/26/2013   Melanoma of skin (HCC) 08/07/2010   [name] 08/06/2010   SCLERODERMA 08/06/2010   Osteoarthritis 08/06/2010    PCP: Bennie Pierini, FNP  REFERRING PROVIDER: Horton Chin, MD   REFERRING DIAG: Bilateral leg weakness   THERAPY DIAG:  Muscle weakness (generalized)  Rationale for Evaluation and Treatment: Rehabilitation  ONSET DATE: November 2023  SUBJECTIVE:   SUBJECTIVE STATEMENT: Had a fall a couple of weeks ago.  Bending over to look on lower shelf of refrigerator and got up too quickly and fell.  Was able to get up by myself.  She states "I know better than that" as when she first gets up in the morning she sits on her bedside for awhile before getting up.  Recommended she use a cane.  She said she has several at home.  PERTINENT HISTORY: Allergies, [name], hypertension, heart failure, osteoarthritis, osteoporosis, depression, anxiety, and torn left rotator cuff PAIN:  Are  you having pain? Yes: NPRS scale: 7/10 Pain location: right hip  Pain description: ache and sore  PRECAUTIONS: ICD/Pacemaker  RED FLAGS: None   WEIGHT BEARING RESTRICTIONS: No  FALLS:  Has patient fallen in last 6 months? No  LIVING ENVIRONMENT: Lives with: lives with their spouse Lives in: House/apartment Stairs: Yes: Internal: 7 steps; on right going up; step to pattern Has following equipment at home: None  OCCUPATION: retired   PLOF: Independent  PATIENT GOALS: improved strength, be able to go shopping, and be more steady when standing and walking  NEXT MD VISIT: 06/29/23  OBJECTIVE:  Note: Objective measures were completed at Evaluation unless otherwise noted.  COGNITION: Overall cognitive status: Within functional limits for tasks assessed     SENSATION: Patient reports no numbness or tingling in her feet or lower extremities.   EDEMA:  No edema observed   POSTURE: rounded shoulders and forward head  LOWER EXTREMITY ROM: WFL for activities assessed   LOWER EXTREMITY MMT:  MMT Right eval Left eval  Hip flexion 3/5 3+/5  Hip extension    Hip abduction    Hip adduction    Hip internal rotation    Hip external rotation  Knee flexion 3/5 3+/5  Knee extension 4/5 4/5  Ankle dorsiflexion 3/5 3+/5  Ankle plantarflexion    Ankle inversion    Ankle eversion     (Blank rows = not tested)  FUNCTIONAL TESTS:  5 times sit to stand: 15.48 seconds without upper extremity support Timed up and go (TUG): 11.39 seconds  BALANCE:  Narrow BOS, eyes open, firm surface: 30 seconds  Narrow BOS, eyes closed, firm surface: 30 seconds with increased sway  Tandem (right foot leading), eyes open, firm surface: 14 seconds  Tandem (left foot leading), eyes open, firm surface: 14 seconds   GAIT: Assistive device utilized: None Level of assistance: Complete Independence Comments: No significant gait deviations observed   TODAY'S TREATMENT:                                                                                                                               DATE:   04/28/23:                                     EXERCISE LOG  Exercise Repetitions and Resistance Comments  Nustep level 3 LE's only x 15 minutes at level 3.   Rockerboard In parallel bars x 5 minutes   3# bil knee ext To fatigue x 2   3# bil marching To fatigue x 2   Hip abduction 2 minutes red theraband   Ball squeeze (hip add) 2 minutes   Ham curls Red theraband to fatigue x 2      04/15/23                                    EXERCISE LOG  Exercise Repetitions and Resistance Comments  Nustep Lvl 2 x 20 mins   LAQs 2# x 2.5 mins   Seated Marches 2# x 2.5 mins   Seated Hip Abduction Red 2.5 mins   Seated Hip Adduction 2.5 mins   Seated Ham Curls 20 reps bil   STS X10 reps    Blank cell = exercise not performed today   PATIENT EDUCATION:  Education details: Plan of care, prognosis, healing, and goals for therapy Person educated: Patient Education method: Explanation Education comprehension: verbalized understanding  HOME EXERCISE PROGRAM:   ASSESSMENT:  CLINICAL IMPRESSION: Patient did have a fall a couple weeks which she attributes to getting up too fast from a bent over position.  She states she knows to wait a bit when getting up from supine or bending but did not on this occasion.  She has multiple canes around her home and it is recommended to use a cane.  Patient did well with therex today and is very motivated. OBJECTIVE IMPAIRMENTS: decreased activity tolerance, decreased balance, decreased endurance, decreased mobility, difficulty walking, decreased strength, postural dysfunction, and pain.  ACTIVITY LIMITATIONS: standing, stairs, and locomotion level  PARTICIPATION LIMITATIONS: meal prep, cleaning, shopping, and community activity  PERSONAL FACTORS: Past/current experiences, Time since onset of injury/illness/exacerbation, and 3+ comorbidities:  Allergies, [name], hypertension, heart failure, osteoarthritis, osteoporosis, depression, anxiety, and torn left rotator cuff  are also affecting patient's functional outcome.   REHAB POTENTIAL: Good  CLINICAL DECISION MAKING: Evolving/moderate complexity  EVALUATION COMPLEXITY: Moderate   GOALS: Goals reviewed with patient? Yes  LONG TERM GOALS: Target date: 05/11/23  Patient will be independent with her HEP. Baseline:  Goal status: INITIAL  2.  Patient will be able to stand for at least 30 minutes for improved function cooking. Baseline:  Goal status: INITIAL  3.  Patient will improve her 5 times sit to stand time to 12 seconds or less for improved lower extremity power. Baseline:  Goal status: INITIAL  4.  Patient will be able to navigate at least 4 steps with a reciprocal pattern for improved household mobility. Baseline:  Goal status: INITIAL  PLAN:  PT FREQUENCY: 2x/week  PT DURATION: 4 weeks  PLANNED INTERVENTIONS: Therapeutic exercises, Therapeutic activity, Neuromuscular re-education, Balance training, Gait training, Patient/Family education, Self Care, Stair training, Cryotherapy, Moist heat, and Re-evaluation  PLAN FOR NEXT SESSION: NuStep, lower extremity strengthening, and balance interventions   Hayleen Clinkscales, [place], PT 04/28/2023, 2:34 PM

## 2023-05-05 ENCOUNTER — Ambulatory Visit: Payer: Medicare Other | Admitting: Physical Therapy

## 2023-05-05 ENCOUNTER — Encounter: Payer: Self-pay | Admitting: Physical Therapy

## 2023-05-05 DIAGNOSIS — M6281 Muscle weakness (generalized): Secondary | ICD-10-CM

## 2023-05-05 DIAGNOSIS — R29898 Other symptoms and signs involving the musculoskeletal system: Secondary | ICD-10-CM | POA: Diagnosis not present

## 2023-05-05 NOTE — Therapy (Signed)
OUTPATIENT PHYSICAL THERAPY LOWER EXTREMITY TREATMENT   Patient Name: Haley Trevino MRN: [id number] DOB:[birth year], 82 y.o., female Today's Date: 05/05/2023  END OF SESSION:  PT End of Session - 05/05/23 1339     Visit Number 4    Number of Visits 8    Date for PT Re-Evaluation 06/05/23    PT Start Time 0137    PT Stop Time 0209    PT Time Calculation (min) 32 min    Activity Tolerance Patient tolerated treatment well    Behavior During Therapy [facility] for tasks assessed/performed             Past Medical History:  Diagnosis Date   Cataract    Depression    GERD (gastroesophageal reflux disease)    Hyperlipidemia    Hypertension    Hypothyroidism    IBS (irritable bowel syndrome)    Osteoarthritis    Raynaud disease    S/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 Lynnette Caffey & Bartle   Scleroderma [facility])    Thyroid disease    hypothyroidism   Past Surgical History:  Procedure Laterality Date   BREAST ENHANCEMENT SURGERY  1975   BREAST IMPLANT REMOVAL Bilateral 04/23/2016   Procedure: REMOVALBILATERAL BREAST IMPLANTS;  Surgeon: Peggye Form, DO;  Location: [facility];  Service: Plastics;  Laterality: Bilateral;   CHOLECYSTECTOMY  1973   ESOPHAGOGASTRODUODENOSCOPY N/A 10/25/2020   Procedure: ESOPHAGOGASTRODUODENOSCOPY (EGD);  Surgeon: Vida Rigger, MD;  Location: [facility];  Service: Endoscopy;  Laterality: N/A;  enteroscopy   HAND SURGERY  08/2012; 11/2012   HOT HEMOSTASIS N/A 10/25/2020   Procedure: HOT HEMOSTASIS (ARGON PLASMA COAGULATION/BICAP);  Surgeon: Vida Rigger, MD;  Location: [facility];  Service: Endoscopy;  Laterality: N/A;   INTRAOPERATIVE TRANSTHORACIC ECHOCARDIOGRAM N/A 05/13/2022   Procedure: INTRAOPERATIVE TRANSTHORACIC ECHOCARDIOGRAM;  Surgeon: Kathleene Hazel, MD;  [facility];  Service: Open Heart Surgery;  Laterality:  N/A;   INTRAOPERATIVE TRANSTHORACIC ECHOCARDIOGRAM N/A 05/20/2022   Procedure: INTRAOPERATIVE TRANSTHORACIC ECHOCARDIOGRAM;  Surgeon: Orbie Pyo, MD;  Location: [facility] OR;  Service: Open Heart Surgery;  Laterality: N/A;   IR RADIOLOGIST EVAL & MGMT  07/27/2017   IR SACROPLASTY BILATERAL  07/31/2017   IR VERTEBROPLASTY CERV/THOR BX INC UNI/BIL INC/INJECT/IMAGING  03/13/2020   IR VERTEBROPLASTY EA ADDL (T&LS) BX INC UNI/BIL INC INJECT/IMAGING  03/14/2020   MELANOMA EXCISION     at 31 yrs of age   ORIF HIP FRACTURE Right 02/22/2014   Procedure: OPEN REDUCTION INTERNAL FIXATION RIGHT HIP;  Surgeon: Darreld Mclean, MD;  Location: [facility];  Service: Orthopedics;  Laterality: Right;   PACEMAKER IMPLANT N/A 05/27/2022   Procedure: PACEMAKER IMPLANT;  Surgeon: Lanier Prude, MD;  Location: [facility] INVASIVE CV LAB;  Service: Cardiovascular;  Laterality: N/A;   RIGHT HEART CATH N/A 01/16/2023   Procedure: RIGHT HEART CATH;  Surgeon: Dorthula Nettles, DO;  Location: [facility];  Service: Cardiovascular;  Laterality: N/A;   RIGHT HEART CATH AND CORONARY ANGIOGRAPHY N/A 03/28/2022   Procedure: RIGHT HEART CATH AND CORONARY ANGIOGRAPHY;  Surgeon: Orbie Pyo, MD;  Location: [facility];  Service: Cardiovascular;  Laterality: N/A;   TOTAL ABDOMINAL HYSTERECTOMY  1974   TRANSCATHETER AORTIC VALVE REPLACEMENT, TRANSFEMORAL Left 05/13/2022   Procedure: Transcatheter Aortic Valve Replacement, Transfemoral;  Surgeon: Kathleene Hazel, MD;  Location: [facility];  Service: Open  Heart Surgery;  Laterality: Left;   TRANSCATHETER AORTIC VALVE REPLACEMENT, TRANSFEMORAL Left 05/20/2022   Procedure: Transcatheter Aortic Valve Replacement, Transfemoral using an Edwards SAPIEN 3 Ultra 23 MM Heart Valve;  Surgeon: Orbie Pyo, MD;  Location: [facility];  Service: Open Heart Surgery;  Laterality: Left;  Left Transfemoral   Patient Active Problem List   Diagnosis Date Noted   Pulmonary HTN (HCC)  12/18/2022   Heart block AV complete (HCC) 05/27/2022   S/P TAVR (transcatheter aortic valve replacement) 05/20/2022   Anxiety 05/14/2022   Hyperkalemia 05/14/2022   Acute on chronic diastolic heart failure (HCC) 05/13/2022   Acute respiratory distress 05/13/2022   Nonrheumatic aortic valve stenosis 04/28/2022   Precordial chest pain 03/13/2022   History of adenomatous polyp of colon 09/21/2020   Weight loss 09/21/2020   Irritable bowel syndrome 09/21/2020   Breast implant rupture 09/04/2020   Iron deficiency anemia due to chronic blood loss 08/28/2020   DOE (dyspnea on exertion) 11/18/2017   Murmur 11/18/2017   Impingement syndrome of left shoulder region 12/08/2014   Osteoporosis 09/20/2014   Hip fracture requiring operative repair (HCC) 02/22/2014   Hypothyroidism 11/29/2013   Depression 11/29/2013   GERD (gastroesophageal reflux disease) 11/29/2013   Hypertension 11/29/2013   Hyperlipidemia with target LDL less than 100 11/29/2013   Thyroid cyst 04/26/2013   Melanoma of skin (HCC) 08/07/2010   [name] 08/06/2010   SCLERODERMA 08/06/2010   Osteoarthritis 08/06/2010    PCP: Bennie Pierini, FNP  REFERRING PROVIDER: Horton Chin, MD   REFERRING DIAG: Bilateral leg weakness   THERAPY DIAG:  Muscle weakness (generalized)  Rationale for Evaluation and Treatment: Rehabilitation  ONSET DATE: November 2023  SUBJECTIVE:   SUBJECTIVE STATEMENT: Had a fall a couple of weeks ago.  Bending over to look on lower shelf of refrigerator and got up too quickly and fell.  Was able to get up by myself.  She states "I know better than that" as when she first gets up in the morning she sits on her bedside for awhile before getting up.  Recommended she use a cane.  She said she has several at home.  PERTINENT HISTORY: Allergies, [name], hypertension, heart failure, osteoarthritis, osteoporosis, depression, anxiety, and torn left rotator cuff PAIN:  Are  you having pain? Yes: NPRS scale: 7/10 Pain location: right hip  Pain description: ache and sore  PRECAUTIONS: ICD/Pacemaker  RED FLAGS: None   WEIGHT BEARING RESTRICTIONS: No  FALLS:  Has patient fallen in last 6 months? No  LIVING ENVIRONMENT: Lives with: lives with their spouse Lives in: House/apartment Stairs: Yes: Internal: 7 steps; on right going up; step to pattern Has following equipment at home: None  OCCUPATION: retired   PLOF: Independent  PATIENT GOALS: improved strength, be able to go shopping, and be more steady when standing and walking  NEXT MD VISIT: 06/29/23  OBJECTIVE:  Note: Objective measures were completed at Evaluation unless otherwise noted.  COGNITION: Overall cognitive status: Within functional limits for tasks assessed     SENSATION: Patient reports no numbness or tingling in her feet or lower extremities.   EDEMA:  No edema observed   POSTURE: rounded shoulders and forward head  LOWER EXTREMITY ROM: WFL for activities assessed   LOWER EXTREMITY MMT:  MMT Right eval Left eval  Hip flexion 3/5 3+/5  Hip extension    Hip abduction    Hip adduction    Hip internal rotation    Hip external rotation  Knee flexion 3/5 3+/5  Knee extension 4/5 4/5  Ankle dorsiflexion 3/5 3+/5  Ankle plantarflexion    Ankle inversion    Ankle eversion     (Blank rows = not tested)  FUNCTIONAL TESTS:  5 times sit to stand: 15.48 seconds without upper extremity support Timed up and go (TUG): 11.39 seconds  BALANCE:  Narrow BOS, eyes open, firm surface: 30 seconds  Narrow BOS, eyes closed, firm surface: 30 seconds with increased sway  Tandem (right foot leading), eyes open, firm surface: 14 seconds  Tandem (left foot leading), eyes open, firm surface: 14 seconds   GAIT: Assistive device utilized: None Level of assistance: Complete Independence Comments: No significant gait deviations observed   TODAY'S TREATMENT:                                                                                                                               DATE:    05/05/23:                                     EXERCISE LOG  Exercise Repetitions and Resistance Comments  Nustep (LE's only) 15 minutes at level 3.   Rockerboard In parallel bars x 5 minutes   4# bil knee ext To fatigue x 2    4# bil marching To fatigue x 2    Ball squeeze 2 minutes   Hip abduction Red theraband x 2 minutes     04/28/23:                                     EXERCISE LOG  Exercise Repetitions and Resistance Comments  Nustep level 3 LE's only x 15 minutes at level 3.   Rockerboard In parallel bars x 5 minutes   3# bil knee ext To fatigue x 2   3# bil marching To fatigue x 2   Hip abduction 2 minutes red theraband   Ball squeeze (hip add) 2 minutes   Ham curls Red theraband to fatigue x 2      04/15/23                                    EXERCISE LOG  Exercise Repetitions and Resistance Comments  Nustep Lvl 2 x 20 mins   LAQs 2# x 2.5 mins   Seated Marches 2# x 2.5 mins   Seated Hip Abduction Red 2.5 mins   Seated Hip Adduction 2.5 mins   Seated Ham Curls 20 reps bil   STS X10 reps    Blank cell = exercise not performed today   PATIENT EDUCATION:  Education details: Plan of care, prognosis, healing, and goals for therapy Person educated: Patient Education method: Explanation  Education comprehension: verbalized understanding  HOME EXERCISE PROGRAM:   ASSESSMENT:  CLINICAL IMPRESSION: Patient very motivated and did all activities with excellent technique and efficiency, without complaint.  OBJECTIVE IMPAIRMENTS: decreased activity tolerance, decreased balance, decreased endurance, decreased mobility, difficulty walking, decreased strength, postural dysfunction, and pain.   ACTIVITY LIMITATIONS: standing, stairs, and locomotion level  PARTICIPATION LIMITATIONS: meal prep, cleaning, shopping, and community activity  PERSONAL FACTORS:  Past/current experiences, Time since onset of injury/illness/exacerbation, and 3+ comorbidities: Allergies, [name], hypertension, heart failure, osteoarthritis, osteoporosis, depression, anxiety, and torn left rotator cuff  are also affecting patient's functional outcome.   REHAB POTENTIAL: Good  CLINICAL DECISION MAKING: Evolving/moderate complexity  EVALUATION COMPLEXITY: Moderate   GOALS: Goals reviewed with patient? Yes  LONG TERM GOALS: Target date: 05/11/23  Patient will be independent with her HEP. Baseline:  Goal status: INITIAL  2.  Patient will be able to stand for at least 30 minutes for improved function cooking. Baseline:  Goal status: INITIAL  3.  Patient will improve her 5 times sit to stand time to 12 seconds or less for improved lower extremity power. Baseline:  Goal status: INITIAL  4.  Patient will be able to navigate at least 4 steps with a reciprocal pattern for improved household mobility. Baseline:  Goal status: INITIAL  PLAN:  PT FREQUENCY: 2x/week  PT DURATION: 4 weeks  PLANNED INTERVENTIONS: Therapeutic exercises, Therapeutic activity, Neuromuscular re-education, Balance training, Gait training, Patient/Family education, Self Care, Stair training, Cryotherapy, Moist heat, and Re-evaluation  PLAN FOR NEXT SESSION: NuStep, lower extremity strengthening, and balance interventions   Videl Nobrega, [place], PT 05/05/2023, 2:19 PM

## 2023-05-13 ENCOUNTER — Telehealth: Payer: Self-pay

## 2023-05-13 ENCOUNTER — Ambulatory Visit (HOSPITAL_COMMUNITY): Payer: Medicare Other | Attending: Cardiovascular Disease | Admitting: Physician Assistant

## 2023-05-13 ENCOUNTER — Ambulatory Visit (HOSPITAL_BASED_OUTPATIENT_CLINIC_OR_DEPARTMENT_OTHER): Payer: Medicare Other

## 2023-05-13 VITALS — BP 130/80 | HR 62 | Ht 62.0 in | Wt 106.2 lb

## 2023-05-13 DIAGNOSIS — D500 Iron deficiency anemia secondary to blood loss (chronic): Secondary | ICD-10-CM

## 2023-05-13 DIAGNOSIS — M349 Systemic sclerosis, unspecified: Secondary | ICD-10-CM | POA: Diagnosis not present

## 2023-05-13 DIAGNOSIS — Z952 Presence of prosthetic heart valve: Secondary | ICD-10-CM | POA: Insufficient documentation

## 2023-05-13 DIAGNOSIS — I4892 Unspecified atrial flutter: Secondary | ICD-10-CM | POA: Diagnosis not present

## 2023-05-13 DIAGNOSIS — I050 Rheumatic mitral stenosis: Secondary | ICD-10-CM

## 2023-05-13 DIAGNOSIS — I480 Paroxysmal atrial fibrillation: Secondary | ICD-10-CM | POA: Diagnosis not present

## 2023-05-13 DIAGNOSIS — Z950 Presence of cardiac pacemaker: Secondary | ICD-10-CM

## 2023-05-13 DIAGNOSIS — I7300 Raynaud's syndrome without gangrene: Secondary | ICD-10-CM | POA: Diagnosis not present

## 2023-05-13 DIAGNOSIS — W19XXXA Unspecified fall, initial encounter: Secondary | ICD-10-CM | POA: Insufficient documentation

## 2023-05-13 DIAGNOSIS — J9611 Chronic respiratory failure with hypoxia: Secondary | ICD-10-CM | POA: Diagnosis not present

## 2023-05-13 DIAGNOSIS — I5032 Chronic diastolic (congestive) heart failure: Secondary | ICD-10-CM | POA: Insufficient documentation

## 2023-05-13 LAB — ECHOCARDIOGRAM COMPLETE
AR max vel: 1.73 cm2
AV Area VTI: 2 cm2
AV Area mean vel: 1.77 cm2
AV Mean grad: 16 mm[Hg]
AV Peak grad: 29.5 mm[Hg]
Ao pk vel: 2.72 m/s
Area-P 1/2: 2.95 cm2
MV VTI: 1.58 cm2
S' Lateral: 2.1 cm

## 2023-05-13 NOTE — Telephone Encounter (Signed)
LM on VM to call back.  We need patient to send a remote transmission to see if any underlying reason for fall on Sunday, 11/3.  Also, to check that function appears normal and no complication from fall.

## 2023-05-13 NOTE — Patient Instructions (Addendum)
Medication Instructions:  Your physician recommends that you continue on your current medications as directed. Please refer to the Current Medication list given to you today.  *If you need a refill on your cardiac medications before your next appointment, please call your pharmacy*  Follow-Up: At [facility], you and your health needs are our priority.  As part of our continuing mission to provide you with exceptional heart care, we have created designated Provider Care Teams.  These Care Teams include your primary Cardiologist (physician) and Advanced Practice Providers (APPs -  Physician Assistants and Nurse Practitioners) who all work together to provide you with the care you need, when you need it.  Your next appointment:   May 2025 with Dr. Antoine Poche  Other Instructions [facility] - [address] [place], [place] [address]  [facility] - [address], [place], [place] [address]

## 2023-05-13 NOTE — Telephone Encounter (Signed)
-----   Message from Nurse Kinnie Feil C sent at 05/13/2023  3:46 PM EST ----- Hey! Patient saw Carlean Jews today in clinic and told her that she had a fall on Sunday. She thinks it was just a mechanical fall but wanted to make sure that it wasn't cardiac related. Can you do a remote check on her device to see if anything showed up Sunday evening?  Thanks! Carly

## 2023-05-13 NOTE — Progress Notes (Addendum)
[facility]   MULTIDISCIPLINARY HEART VALVE CLINIC                                     Cardiology Office Note:    Date:  05/13/2023   ID:  Haley Trevino, DOB 05/04/1941, MRN [id number]  PCP:  Haley Pierini, FNP  [facility] Cardiologist:  Haley Rotunda, MD  / Dr. Lynnette Trevino & Dr. Laneta Trevino (TAVR)      [facility] Electrophysiologist:  Haley Prude, MD   Referring MD: Haley Trevino, Haley, *   1 year s/p TAVR  History of Present Illness:    Haley Trevino is a 82 y.o. female with a hx of scleroderma and Raynaud's (on chronic Revatio therapy), HLD, iron deficiency anemia on iron infusions, HFpEF, atrial flutter not on OAC due to GI bleeding, CHB s/p PPM (05/2022), moderate mitral stenosis due to MAC and severe AS s/p TAVR (05/13/22) who presents to clinic for follow up.   Echo 03/2022 showed EF >75%, mod concentric LVH, and severe AS with a mean grad 48 mmHg, AVA 1.01 cm2 as well as severe MAC with moderate MS. [facility] 03/2022 showed minimal obstructive coronary artery disease. She underwent successful TAVR on 05/20/2022 with a 23 mm Edwards Sapien 3 Ultra Resilia THV via the TF approach. Post operative echo on 05/21/22 showed EF 60% and normally functioning TAVR with a mean gradient 11.5 and trivial AR. She then developed recurrent respiratory failure requiring IV diuresis as well as CHB and underwent placement of an Assurity MRI PPM on 05/27/2022 by Dr. Lalla Trevino. Additionally, she developed new onset aflutter and was started on Eliquis  She developed melanotic stools and Hg dropped to 6.6. She was transfused 2UPRBCs and Eliquis was discontinued and melena cleared. Has only been on a baby aspirin since that time. Referred to Dr. Gasper Trevino for possible pulmonary HTN. RHC 01/16/23 showed normal pre and post capillary filling pressures, mildly elevated PVR & PA mean and normal cardiac output and index.   She has had chronic shortness of breath and diffuse body  pain, including chest pain that is felt to be related to her chronic rheumatologic conditions.   Today the patient presents to clinic for follow up. Here with her husband. She stays dizzy and fatigued all the time but she had a fall on Sunday around dinner time. Since then she has not been able to bear weight on her right leg.  Followed at the pain clinic and on low dose naltrexone. On a strict diet of with no wheat or sugar. This has helped with her chest wall and breast pain. Working with PT. Not on oxygen anymore. Has chronic shortness of breath which is unchanged. No LE edema, orthopnea or PND.   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 Lynnette Trevino & Bartle   Scleroderma [facility])    Thyroid disease    hypothyroidism    Past Surgical History:  Procedure Laterality Date   BREAST ENHANCEMENT SURGERY  1975   BREAST IMPLANT REMOVAL Bilateral 04/23/2016  Procedure: REMOVALBILATERAL BREAST IMPLANTS;  Surgeon: Peggye Form, DO;  Location: [facility];  Service: Plastics;  Laterality: Bilateral;   CHOLECYSTECTOMY  1973   ESOPHAGOGASTRODUODENOSCOPY N/A 10/25/2020   Procedure: ESOPHAGOGASTRODUODENOSCOPY (EGD);  Surgeon: Vida Rigger, MD;  Location: [facility];  Service: Endoscopy;  Laterality: N/A;  enteroscopy   HAND SURGERY  08/2012; 11/2012   HOT HEMOSTASIS N/A 10/25/2020   Procedure: HOT HEMOSTASIS (ARGON PLASMA COAGULATION/BICAP);  Surgeon: Vida Rigger, MD;  Location: [facility];  Service: Endoscopy;  Laterality: N/A;   INTRAOPERATIVE TRANSTHORACIC ECHOCARDIOGRAM N/A 05/13/2022   Procedure: INTRAOPERATIVE TRANSTHORACIC ECHOCARDIOGRAM;  Surgeon: Kathleene Hazel, MD;  Location: [facility];  Service: Open Heart Surgery;  Laterality: N/A;   INTRAOPERATIVE TRANSTHORACIC ECHOCARDIOGRAM N/A 05/20/2022   Procedure: INTRAOPERATIVE TRANSTHORACIC ECHOCARDIOGRAM;  Surgeon: Orbie Pyo, MD;  Location: [facility] OR;  Service: Open Heart Surgery;  Laterality: N/A;   IR RADIOLOGIST EVAL & MGMT  07/27/2017   IR SACROPLASTY BILATERAL  07/31/2017   IR VERTEBROPLASTY CERV/THOR BX INC UNI/BIL INC/INJECT/IMAGING  03/13/2020   IR VERTEBROPLASTY EA ADDL (T&LS) BX INC UNI/BIL INC INJECT/IMAGING  03/14/2020   MELANOMA EXCISION     at 31 yrs of age   ORIF HIP FRACTURE Right 02/22/2014   Procedure: OPEN REDUCTION INTERNAL FIXATION RIGHT HIP;  Surgeon: Darreld Mclean, MD;  Location: [facility];  Service: Orthopedics;  Laterality: Right;   PACEMAKER IMPLANT N/A 05/27/2022   Procedure: PACEMAKER IMPLANT;  Surgeon: Haley Prude, MD;  Location: [facility];  Service: Cardiovascular;  Laterality: N/A;   RIGHT HEART CATH N/A 01/16/2023   Procedure: RIGHT HEART CATH;  Surgeon: Dorthula Nettles, DO;  Location: [facility];  Service: Cardiovascular;  Laterality: N/A;   RIGHT HEART CATH AND CORONARY ANGIOGRAPHY N/A 03/28/2022   Procedure: RIGHT HEART CATH AND CORONARY ANGIOGRAPHY;  Surgeon: Orbie Pyo, MD;  Location: [facility];  Service: Cardiovascular;  Laterality: N/A;   TOTAL ABDOMINAL HYSTERECTOMY  1974   TRANSCATHETER AORTIC VALVE REPLACEMENT, TRANSFEMORAL Left 05/13/2022   Procedure: Transcatheter Aortic Valve Replacement, Transfemoral;  Surgeon: Kathleene Hazel, MD;  Location: [facility];  Service: Open Heart Surgery;  Laterality: Left;   TRANSCATHETER AORTIC VALVE REPLACEMENT, TRANSFEMORAL Left 05/20/2022   Procedure: Transcatheter Aortic Valve Replacement, Transfemoral using an Edwards SAPIEN 3 Ultra 23 MM Heart Valve;  Surgeon: Orbie Pyo, MD;  Location: [facility];  Service: Open Heart Surgery;  Laterality: Left;  Left  Transfemoral    Current Medications: Current Meds  Medication Sig   acetaminophen (TYLENOL) 500 MG tablet Take 1,000 mg by mouth at bedtime.   aspirin EC 81 MG tablet Take 81 mg by mouth daily. Swallow whole.   clonazePAM (KLONOPIN) 0.5 MG tablet Take 1 tablet (0.5 mg total) by mouth 2 (two) times daily as needed for anxiety.   cyclobenzaprine (FLEXERIL) 10 MG tablet Take 1 tablet (10 mg total) by mouth 3 (three) times daily as needed for muscle spasms.   ezetimibe (ZETIA) 10 MG tablet Take 1 tablet (10 mg total) by mouth daily.   FLUoxetine (PROZAC) 40 MG capsule Take 1 capsule (40 mg total) by mouth daily.   fluticasone (FLONASE) 50 MCG/ACT nasal spray PLACE 2 SPRAYS INTO BOTH NOSTRILS DAILY (Patient taking differently: Place 2 sprays into both nostrils as needed.)   furosemide (LASIX) 20 MG tablet Take 1 tablet (20 mg total) by mouth daily as needed for edema.   NIFEdipine (PROCARDIA-XL/NIFEDICAL-XL) 30 MG 24  hr tablet Take 1 tablet (30 mg total) by mouth daily.   NONFORMULARY OR COMPOUNDED ITEM Take 1 mg by mouth at bedtime. Low dose naltrexone   omeprazole (PRILOSEC) 20 MG capsule TAKE 1 TABLET DAILY   PROLIA 60 MG/ML SOSY injection Inject 60 mg into the skin every 6 (six) months. (Patient taking differently: Inject 60 mg into the skin every 6 (six) months. Per patient "unable to take during iron (Fe) infusion")   RESTASIS 0.05 % ophthalmic emulsion Place 1 drop into both eyes 2 (two) times daily.   sildenafil (REVATIO) 20 MG tablet Take 1 tablet (20 mg total) by mouth 2 (two) times daily.   SYNTHROID 75 MCG tablet TAKE (1) TABLET DAILY BE- FORE BREAKFAST.      ROS:   Please see the history of present illness.    All other systems reviewed and are negative.  EKGs   EKG:  EKG is not ordered today.   Recent Labs: 05/25/2022: B Natriuretic Peptide 1,646.1 05/29/2022: Magnesium 1.7 08/01/2022: NT-Pro BNP 684 10/17/2022: ALT 12 12/24/2022: BUN 12; Creatinine 0.89 01/16/2023:  Potassium 3.8; Potassium 3.8; Sodium 140; Sodium 140 03/10/2023: Hemoglobin 10.1; Platelets 268  Recent Lipid Panel    Component Value Date/Time   CHOL 192 08/18/2022 1429   TRIG 153 (H) 08/18/2022 1429   TRIG 176 (H) 09/04/2014 1419   HDL 49 08/18/2022 1429   HDL 44 09/04/2014 1419   CHOLHDL 3.9 08/18/2022 1429   LDLCALC 116 (H) 08/18/2022 1429   LDLCALC 92 11/29/2013 1718     Risk Assessment/Calculations:    CHA2DS2-VASc Score = 5   This indicates a 7.2% annual risk of stroke. The patient's score is based upon: CHF History: 1 HTN History: 1 Diabetes History: 0 Stroke History: 0 Vascular Disease History: 0 Age Score: 2 Gender Score: 1          Physical Exam:    VS:  BP 130/80   Pulse 62   Ht 5\' 2"  (1.575 m)   Wt 106 lb 3.2 oz (48.2 kg)   SpO2 (!) 87%   BMI 19.42 kg/m     Wt Readings from Last 3 Encounters:  05/13/23 106 lb 3.2 oz (48.2 kg)  03/30/23 114 lb (51.7 kg)  02/09/23 113 lb (51.3 kg)     GEN: chronically ill appearing.  NECK: No JVD; No carotid bruits CARDIAC: RRR, no murmurs, rubs, gallops RESPIRATORY:  Clear to auscultation without rales, wheezing or rhonchi  ABDOMEN: Soft, non-tender, non-distended EXTREMITIES:  No edema; No deformity   ASSESSMENT:    1. S/P TAVR (transcatheter aortic valve replacement)   2. Cardiac pacemaker in situ   3. PAF (paroxysmal atrial fibrillation) (HCC)   4. Chronic respiratory failure with hypoxia (HCC)   5. Chronic heart failure with preserved ejection fraction (HCC)   6. Iron deficiency anemia due to chronic blood loss   7. SCLERODERMA   8. Fall, initial encounter    PLAN:    In order of problems listed above:   Severe AS s/p TAVR: echo today shows EF 60%, normally functioning TAVR with a mean gradient of 14 mm hg and no PVL as well as severe MAC with mod MS (mean gradient 9 mm hg). She has NYHA class II symptoms which is chronic. SBE prophylaxis discussed; the patient is edentulous and does not go to  the dentist. Continue on a baby aspirin 81mg  daily. She will continue regular follow up with Dr. Antoine Poche.  CHB s/p PPM: followed by Dr.  Lalla Trevino.  Will interrogate device, see below.   Paroxysmal atrial Fib/flutter: diagnosed during TAVR admission. Started on Eliquis 2.5mg  BID but later discontinued 2/2 acute blood loss anemia. Has only been on aspirin and has had no symptomatic recurrence.    Chronic respiratory failure with hypoxia: had been followed by Dr. Sherene Sires with pulm but now PRN. Now off home 02.   Chronic HFpEF: now on PRN lasix. No s/s CHF.   Chronic Iron Deficiency Anemia: follows closely with heme onc. Gets regular iron infusions. Hg stable ~10.   Moderate mitral stenosis with MAC: echo today shows mod MS with a mean gradient 9 at HR 80 bpm with MVA 1. 6 cm2.    H/o Raynaud's & scleroderma: Sildenafil continued. Continue follow up with rhuem.    Recent Fall: this sounded mechanical to me but she is unsure if she briefly lost consciousness. She has not been able to bear weight on right leg. I have asked her to go to [facility] urgent [facility] to be seen and get X rays. Also I will have her device interrogated to make sure she hasn't had any arrhythmias.   ADDENDUM: Device was interrogated and no cardiac etiology of falls.    Medication Adjustments/Labs and Tests Ordered: Current medicines are reviewed at length with the patient today.  Concerns regarding medicines are outlined above.  No orders of the defined types were placed in this encounter.  No orders of the defined types were placed in this encounter.   Patient Instructions  Medication Instructions:  Your physician recommends that you continue on your current medications as directed. Please refer to the Current Medication list given to you today.  *If you need a refill on your cardiac medications before your next appointment, please call your pharmacy*  Follow-Up: At [facility], you and your health needs  are our priority.  As part of our continuing mission to provide you with exceptional heart care, we have created designated Provider Care Teams.  These Care Teams include your primary Cardiologist (physician) and Advanced Practice Providers (APPs -  Physician Assistants and Nurse Practitioners) who all work together to provide you with the care you need, when you need it.  Your next appointment:   May 2025 with Dr. Antoine Poche  Other Instructions [facility] - [address] [place], [place] [address]  [facility] - [address], [place], [place] [address]   Signed, Cline Crock, PA-C  05/13/2023 3:48 PM    [facility]

## 2023-05-14 NOTE — Telephone Encounter (Signed)
Spoke w/ patient. Nothing seen on device to indicate an issue cooresponding w/ recent fall 11/3. Patient has fallen 4 times in past two weeks. Advised to f/u for hip pain w/ PCP.   Symptoms have me unconvinced it is orthostatic HypoTN. Not always changing position. Suspected loss of consciousness d/t pt stating they are fine then all of a sudden they're on the floor. Normotensive today. Pt will take BP in the morning after waking up and four hours later. [employer] SJM device has a rate drop response that can be enabled if deemed appropriate as well. Sending to physician for review.

## 2023-05-15 ENCOUNTER — Other Ambulatory Visit (HOSPITAL_COMMUNITY): Payer: Medicare Other

## 2023-05-19 ENCOUNTER — Inpatient Hospital Stay: Payer: Medicare Other | Attending: Hematology

## 2023-05-19 ENCOUNTER — Observation Stay (HOSPITAL_COMMUNITY): Payer: Medicare Other

## 2023-05-19 ENCOUNTER — Other Ambulatory Visit: Payer: Self-pay

## 2023-05-19 ENCOUNTER — Emergency Department (HOSPITAL_COMMUNITY): Payer: Medicare Other

## 2023-05-19 ENCOUNTER — Observation Stay (HOSPITAL_COMMUNITY)
Admission: EM | Admit: 2023-05-19 | Discharge: 2023-05-20 | Disposition: A | Discharge status: Home / Self Care | Payer: Medicare Other | Attending: Internal Medicine | Admitting: Internal Medicine

## 2023-05-19 ENCOUNTER — Encounter (HOSPITAL_COMMUNITY): Payer: Self-pay | Admitting: Emergency Medicine

## 2023-05-19 DIAGNOSIS — Z4789 Encounter for other orthopedic aftercare: Secondary | ICD-10-CM | POA: Diagnosis not present

## 2023-05-19 DIAGNOSIS — M25561 Pain in right knee: Secondary | ICD-10-CM | POA: Diagnosis not present

## 2023-05-19 DIAGNOSIS — I110 Hypertensive heart disease with heart failure: Secondary | ICD-10-CM | POA: Diagnosis not present

## 2023-05-19 DIAGNOSIS — Z043 Encounter for examination and observation following other accident: Secondary | ICD-10-CM | POA: Diagnosis not present

## 2023-05-19 DIAGNOSIS — I700 Atherosclerosis of aorta: Secondary | ICD-10-CM | POA: Diagnosis not present

## 2023-05-19 DIAGNOSIS — M85861 Other specified disorders of bone density and structure, right lower leg: Secondary | ICD-10-CM | POA: Diagnosis not present

## 2023-05-19 DIAGNOSIS — M79671 Pain in right foot: Secondary | ICD-10-CM | POA: Diagnosis not present

## 2023-05-19 DIAGNOSIS — Z79899 Other long term (current) drug therapy: Secondary | ICD-10-CM | POA: Diagnosis not present

## 2023-05-19 DIAGNOSIS — I272 Pulmonary hypertension, unspecified: Secondary | ICD-10-CM | POA: Diagnosis not present

## 2023-05-19 DIAGNOSIS — R55 Syncope and collapse: Principal | ICD-10-CM | POA: Diagnosis present

## 2023-05-19 DIAGNOSIS — M85871 Other specified disorders of bone density and structure, right ankle and foot: Secondary | ICD-10-CM | POA: Diagnosis not present

## 2023-05-19 DIAGNOSIS — Z950 Presence of cardiac pacemaker: Secondary | ICD-10-CM | POA: Diagnosis not present

## 2023-05-19 DIAGNOSIS — I5032 Chronic diastolic (congestive) heart failure: Secondary | ICD-10-CM | POA: Diagnosis present

## 2023-05-19 DIAGNOSIS — I771 Stricture of artery: Secondary | ICD-10-CM | POA: Diagnosis not present

## 2023-05-19 DIAGNOSIS — Z952 Presence of prosthetic heart valve: Secondary | ICD-10-CM | POA: Diagnosis not present

## 2023-05-19 DIAGNOSIS — Z7982 Long term (current) use of aspirin: Secondary | ICD-10-CM | POA: Insufficient documentation

## 2023-05-19 DIAGNOSIS — M8580 Other specified disorders of bone density and structure, unspecified site: Secondary | ICD-10-CM | POA: Diagnosis not present

## 2023-05-19 DIAGNOSIS — E039 Hypothyroidism, unspecified: Secondary | ICD-10-CM | POA: Diagnosis not present

## 2023-05-19 DIAGNOSIS — I10 Essential (primary) hypertension: Secondary | ICD-10-CM | POA: Diagnosis not present

## 2023-05-19 DIAGNOSIS — R4182 Altered mental status, unspecified: Secondary | ICD-10-CM | POA: Diagnosis not present

## 2023-05-19 DIAGNOSIS — D500 Iron deficiency anemia secondary to blood loss (chronic): Secondary | ICD-10-CM

## 2023-05-19 DIAGNOSIS — M19071 Primary osteoarthritis, right ankle and foot: Secondary | ICD-10-CM | POA: Diagnosis not present

## 2023-05-19 LAB — IRON AND TIBC
Iron: 60 ug/dL (ref 28–170)
Saturation Ratios: 20 % (ref 10.4–31.8)
TIBC: 307 ug/dL (ref 250–450)
UIBC: 247 ug/dL

## 2023-05-19 LAB — CBG MONITORING, ED: Glucose-Capillary: 86 mg/dL (ref 70–99)

## 2023-05-19 LAB — CBC WITH DIFFERENTIAL/PLATELET
Abs Immature Granulocytes: 0.02 10*3/uL (ref 0.00–0.07)
Basophils Absolute: 0 10*3/uL (ref 0.0–0.1)
Basophils Relative: 1 %
Eosinophils Absolute: 0.1 10*3/uL (ref 0.0–0.5)
Eosinophils Relative: 2 %
HCT: 39.7 % (ref 36.0–46.0)
Hemoglobin: 12.5 g/dL (ref 12.0–15.0)
Immature Granulocytes: 0 %
Lymphocytes Relative: 19 %
Lymphs Abs: 1.2 10*3/uL (ref 0.7–4.0)
MCH: 28.5 pg (ref 26.0–34.0)
MCHC: 31.5 g/dL (ref 30.0–36.0)
MCV: 90.6 fL (ref 80.0–100.0)
Monocytes Absolute: 0.4 10*3/uL (ref 0.1–1.0)
Monocytes Relative: 6 %
Neutro Abs: 4.4 10*3/uL (ref 1.7–7.7)
Neutrophils Relative %: 72 %
Platelets: 289 10*3/uL (ref 150–400)
RBC: 4.38 MIL/uL (ref 3.87–5.11)
RDW: 15.1 % (ref 11.5–15.5)
WBC: 6.1 10*3/uL (ref 4.0–10.5)
nRBC: 0 % (ref 0.0–0.2)

## 2023-05-19 LAB — SAMPLE TO BLOOD BANK

## 2023-05-19 LAB — BASIC METABOLIC PANEL
Anion gap: 10 (ref 5–15)
BUN: 10 mg/dL (ref 8–23)
CO2: 21 mmol/L — ABNORMAL LOW (ref 22–32)
Calcium: 9.1 mg/dL (ref 8.9–10.3)
Chloride: 106 mmol/L (ref 98–111)
Creatinine, Ser: 0.85 mg/dL (ref 0.44–1.00)
GFR, Estimated: >60 mL/min (ref >60)
Glucose, Bld: 101 mg/dL — ABNORMAL HIGH (ref 70–99)
Potassium: 4 mmol/L (ref 3.5–5.1)
Sodium: 137 mmol/L (ref 135–145)

## 2023-05-19 LAB — TROPONIN I (HIGH SENSITIVITY): Troponin I (High Sensitivity): 8 ng/L (ref <18)

## 2023-05-19 LAB — URINALYSIS, ROUTINE W REFLEX MICROSCOPIC
Bilirubin Urine: NEGATIVE
Glucose, UA: NEGATIVE mg/dL
Hgb urine dipstick: NEGATIVE
Ketones, ur: NEGATIVE mg/dL
Leukocytes,Ua: NEGATIVE
Nitrite: NEGATIVE
Protein, ur: NEGATIVE mg/dL
Specific Gravity, Urine: 1.01 (ref 1.005–1.030)
pH: 6 (ref 5.0–8.0)

## 2023-05-19 LAB — FERRITIN: Ferritin: 97 ng/mL (ref 11–307)

## 2023-05-19 MED ORDER — SODIUM CHLORIDE 0.9 % IV BOLUS: 500.0000 mL | Freq: Once | INTRAVENOUS | Status: DC

## 2023-05-19 NOTE — ED Notes (Signed)
Spoke to PA regarding fluids Okay to complete oral rehydration prior to IVF

## 2023-05-19 NOTE — Assessment & Plan Note (Signed)
Stable.  -Resume nifedipine

## 2023-05-19 NOTE — Assessment & Plan Note (Addendum)
3 syncopal episodes, sudden without prodrome. Orthostatic vitals checked, initial increasing heart rate from 74-102, but on repeat this was not present. -Recent echo 05/2023, EF of 60 to 65% indeterminate LV diastolic parameters, status post TAVR, moderate mitral valve stenosis, severe mitral annular calcification - Pace maker interrogated in the ED -Trend troponin -Obtain head CT -Obtain chest x-ray -Was seen by cardiology 11/6, patient reported frequent fall with brief loss of consciousness , pacemaker interrogated-no cardiac etiology of falls.

## 2023-05-19 NOTE — ED Triage Notes (Signed)
Pt presents with unwitnessed fall on Saturday 05/16/23, per pt she was upstairs and next thing she remembers she was on the floor, found by husband, c/o rt hip, rt knee, and rt foot pain, unable to bear weight on leg.

## 2023-05-19 NOTE — Assessment & Plan Note (Signed)
 Resume sildenafil ?

## 2023-05-19 NOTE — ED Notes (Signed)
Pt stated that PA was going to admit her Informed charge RN

## 2023-05-19 NOTE — ED Notes (Signed)
Cancer center drew blood a couple of hours ago  Pt is seen at the Cancer center for iron infusions

## 2023-05-19 NOTE — ED Notes (Signed)
Gave pt Pedialyte

## 2023-05-19 NOTE — ED Notes (Signed)
Pacemaker interrogated [facility] sent Ortho static vitals completed

## 2023-05-19 NOTE — H&P (Addendum)
History and Physical    TELEAH Trevino ZOX:[id number] DOB: [birth year] DOA: 05/19/2023  PCP: Bennie Pierini, FNP   Patient coming from: Home  I have personally briefly reviewed patient's old medical records in [facility]  Chief Complaint: Syncope  HPI: Haley Trevino is a 82 y.o. female with medical history significant for complete heart block and pacemaker status, hypertension, TAVR, diastolic CHF, scleroderma, depression. Patient presented to the ED with complaints of 3 syncopal episodes over the past 2 weeks.  The last episode which she cleared remembers was just 3 days ago, and before that her last syncopal episode was a week prior to that.  She reports 3 days ago, she was standing holding her food tray, she had just dropped it on the counter, and neck she knows she was on the floor, with the contents of heart rate started on the floor.  She does not remember falling.  She denies prodrome.  She is not sure if she lost consciousness.  She does not think he hit her head. No chest pain.  She reports chronic unchanged difficulty breathing with exertion-since her cardiac surgery a year ago, for which she has had workup as outpatient that was unremarkable. She reports intermittent dizziness, but with her falls she did not have prior dizziness.  She reports good oral intake, no vomiting, reports 2 diarrheal episodes today.  She is on Lasix as needed.  ED Course: Temperature 98.4.  Heart rate 60s to 80.  Respiratory rate 16-18.  Blood pressure systolic 133-168.  O2 sats 90 to 96% on room air. Pacemaker interrogated in the ED 2 episodes of atrial tachycardia second episode was today rates up to 140s, lasted about 40 seconds. Orthostatic vitals checked, initial increasing heart rate from 74-102, but on repeat this was not present.  Review of Systems: As per HPI all other systems reviewed and negative.  Past Medical History:  Diagnosis Date   Cataract    Depression    GERD  (gastroesophageal reflux disease)    Hyperlipidemia    Hypertension    Hypothyroidism    IBS (irritable bowel syndrome)    Osteoarthritis    Raynaud disease    S/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 Lynnette Caffey & Bartle   Scleroderma [facility])    Thyroid disease    hypothyroidism    Past Surgical History:  Procedure Laterality Date   BREAST ENHANCEMENT SURGERY  1975   BREAST IMPLANT REMOVAL Bilateral 04/23/2016   Procedure: REMOVALBILATERAL BREAST IMPLANTS;  Surgeon: Peggye Form, DO;  Location: [facility];  Service: Plastics;  Laterality: Bilateral;   CHOLECYSTECTOMY  1973   ESOPHAGOGASTRODUODENOSCOPY N/A 10/25/2020   Procedure: ESOPHAGOGASTRODUODENOSCOPY (EGD);  Surgeon: Vida Rigger, MD;  Location: [facility];  Service: Endoscopy;  Laterality: N/A;  enteroscopy   HAND SURGERY  08/2012; 11/2012   HOT HEMOSTASIS N/A 10/25/2020   Procedure: HOT HEMOSTASIS (ARGON PLASMA COAGULATION/BICAP);  Surgeon: Vida Rigger, MD;  Location: [facility];  Service: Endoscopy;  Laterality: N/A;   INTRAOPERATIVE TRANSTHORACIC ECHOCARDIOGRAM N/A 05/13/2022   Procedure: INTRAOPERATIVE TRANSTHORACIC ECHOCARDIOGRAM;  Surgeon: Kathleene Hazel, MD;  Location: [facility];  Service: Open Heart Surgery;  Laterality: N/A;   INTRAOPERATIVE TRANSTHORACIC ECHOCARDIOGRAM N/A 05/20/2022   Procedure: INTRAOPERATIVE TRANSTHORACIC ECHOCARDIOGRAM;  Surgeon: Orbie Pyo, MD;  Location[facility];  Service: Open Heart Surgery;  Laterality: N/A;   IR RADIOLOGIST EVAL &  MGMT  07/27/2017   IR SACROPLASTY BILATERAL  07/31/2017   IR VERTEBROPLASTY CERV/THOR BX INC UNI/BIL INC/INJECT/IMAGING  03/13/2020   IR VERTEBROPLASTY EA ADDL (T&LS) BX INC UNI/BIL INC INJECT/IMAGING  03/14/2020   MELANOMA EXCISION     at 31 yrs of age   ORIF HIP FRACTURE Right 02/22/2014   Procedure: OPEN REDUCTION INTERNAL FIXATION RIGHT HIP;   Surgeon: Darreld Mclean, MD;  Location: [facility];  Service: Orthopedics;  Laterality: Right;   PACEMAKER IMPLANT N/A 05/27/2022   Procedure: PACEMAKER IMPLANT;  Surgeon: Lanier Prude, MD;  Location: [facility] INVASIVE CV LAB;  Service: Cardiovascular;  Laterality: N/A;   RIGHT HEART CATH N/A 01/16/2023   Procedure: RIGHT HEART CATH;  Surgeon: Dorthula Nettles, DO;  Location: [facility];  Service: Cardiovascular;  Laterality: N/A;   RIGHT HEART CATH AND CORONARY ANGIOGRAPHY N/A 03/28/2022   Procedure: RIGHT HEART CATH AND CORONARY ANGIOGRAPHY;  Surgeon: Orbie Pyo, MD;  Location: [facility];  Service: Cardiovascular;  Laterality: N/A;   TOTAL ABDOMINAL HYSTERECTOMY  1974   TRANSCATHETER AORTIC VALVE REPLACEMENT, TRANSFEMORAL Left 05/13/2022   Procedure: Transcatheter Aortic Valve Replacement, Transfemoral;  Surgeon: Kathleene Hazel, MD;  Location: [facility];  Service: Open Heart Surgery;  Laterality: Left;   TRANSCATHETER AORTIC VALVE REPLACEMENT, TRANSFEMORAL Left 05/20/2022   Procedure: Transcatheter Aortic Valve Replacement, Transfemoral using an Edwards SAPIEN 3 Ultra 23 MM Heart Valve;  Surgeon: Orbie Pyo, MD;  Location: [facility] OR;  Service: Open Heart Surgery;  Laterality: Left;  Left Transfemoral     reports that she has never smoked. She has never used smokeless tobacco. She reports that she does not drink alcohol and does not use drugs.  Allergies  Allergen Reactions   Bactrim [Sulfamethoxazole-Trimethoprim] Nausea And Vomiting   Dilaudid [Hydromorphone Hcl] Anaphylaxis   Eliquis [Apixaban] Other (See Comments)    Caused bleed   Acyclovir And Related Nausea And Vomiting   Crestor [Rosuvastatin Calcium] Other (See Comments)    Muscle pain   Gabapentin Nausea And Vomiting and Swelling    Swelling in feet and in legs   Aleve [Naproxen Sodium] Rash   Lyrica [Pregabalin] Other (See Comments)    Swelling in feet, and in legs    Family History   Problem Relation Age of Onset   Pancreatic cancer Father    Raynaud syndrome Father    Cancer Father        pancreatic   Rashes / Skin problems Daughter        possibly scleraderma   Hip fracture Mother    Mental illness Mother        attempted suicide at 64 yo    Prior to Admission medications   Medication Sig Start Date End Date Taking? Authorizing Provider  acetaminophen (TYLENOL) 500 MG tablet Take 1,000 mg by mouth at bedtime.   Yes [provider]  aspirin EC 81 MG tablet Take 81 mg by mouth daily. Swallow whole.   Yes [provider]  clonazePAM (KLONOPIN) 0.5 MG tablet Take 1 tablet (0.5 mg total) by mouth 2 (two) times daily as needed for anxiety. 02/09/23  Yes Martin, Mary-Margaret, FNP  ezetimibe (ZETIA) 10 MG tablet Take 1 tablet (10 mg total) by mouth daily. 02/09/23  Yes Martin, Mary-Margaret, FNP  FLUoxetine (PROZAC) 40 MG capsule Take 1 capsule (40 mg total) by mouth daily. 02/09/23  Yes Martin, Mary-Margaret, FNP  fluticasone (FLONASE) 50 MCG/ACT nasal spray PLACE 2 SPRAYS INTO  BOTH NOSTRILS DAILY Patient taking differently: Place 2 sprays into both nostrils as needed. 03/16/23  Yes Martin, Mary-Margaret, FNP  NIFEdipine (PROCARDIA-XL/NIFEDICAL-XL) 30 MG 24 hr tablet Take 1 tablet (30 mg total) by mouth daily. 02/09/23  Yes Martin, Mary-Margaret, FNP  NONFORMULARY OR COMPOUNDED ITEM Take 1 mg by mouth at bedtime. Low dose naltrexone Patient taking differently: Take 1 mg by mouth at bedtime. Low dose naltrexone Anti-inflammatory with strict diet to avoid inflammation 03/30/23  Yes Raulkar, Drema Pry, MD  omeprazole (PRILOSEC) 20 MG capsule TAKE 1 TABLET DAILY 02/09/23  Yes Daphine Deutscher, Mary-Margaret, FNP  RESTASIS 0.05 % ophthalmic emulsion Place 1 drop into both eyes 2 (two) times daily. 03/26/22  Yes [provider]  sildenafil (REVATIO) 20 MG tablet Take 1 tablet (20 mg total) by mouth 2 (two) times daily. 03/30/23  Yes Martin, Mary-Margaret, FNP  SYNTHROID 75  MCG tablet TAKE (1) TABLET DAILY BE- FORE BREAKFAST. 02/09/23  Yes Martin, Mary-Margaret, FNP  PROLIA 60 MG/ML SOSY injection Inject 60 mg into the skin every 6 (six) months. Patient not taking: Reported on 05/19/2023 04/21/22   Bennie Pierini, FNP    Physical Exam: Vitals:   05/19/23 1700 05/19/23 1715 05/19/23 1839 05/19/23 2127  BP: 133/68 (!) 150/74 135/68   Pulse: 75 80 75   Resp:   16   Temp:    98.7 F (37.1 C)  TempSrc:    Oral  SpO2: 93% 90% 95%   Weight:      Height:        Constitutional: NAD, calm, comfortable Vitals:   05/19/23 1700 05/19/23 1715 05/19/23 1839 05/19/23 2127  BP: 133/68 (!) 150/74 135/68   Pulse: 75 80 75   Resp:   16   Temp:    98.7 F (37.1 C)  TempSrc:    Oral  SpO2: 93% 90% 95%   Weight:      Height:       Eyes: PERRL, lids and conjunctivae normal ENMT: Mucous membranes are moist.   Neck: normal, supple, no masses, no thyromegaly Respiratory: clear to auscultation bilaterally, no wheezing, no crackles. Normal respiratory effort. No accessory muscle use.  Cardiovascular: Regular rate and rhythm, 3/6 murmur present, no rubs / gallops. No extremity edema.  Extremities warm. Abdomen: no tenderness, no masses palpated. No hepatosplenomegaly. Bowel sounds positive.  Musculoskeletal: no clubbing / cyanosis. No joint deformity upper and lower extremities.  Skin: no rashes, lesions, ulcers. No induration Neurologic: No facial asymmetry, moving extremities spontaneously.Marland Kitchen  Psychiatric: Normal judgment and insight. Alert and oriented x 3. Normal mood.   Labs on Admission: I have personally reviewed following labs and imaging studies  CBC: Recent Labs  Lab 05/19/23 1342  WBC 6.1  NEUTROABS 4.4  HGB 12.5  HCT 39.7  MCV 90.6  PLT 289   Basic Metabolic Panel: Recent Labs  Lab 05/19/23 1342  NA 137  K 4.0  CL 106  CO2 21*  GLUCOSE 101*  BUN 10  CREATININE 0.85  CALCIUM 9.1   BNP (last 3 results) Recent Labs    08/01/22 1050   PROBNP 684   CBG: Recent Labs  Lab 05/19/23 1441  GLUCAP 86   Anemia Panel: Recent Labs    05/19/23 1342  FERRITIN 97  TIBC 307  IRON 60   Urine analysis:    Component Value Date/Time   COLORURINE STRAW (A) 05/19/2023 1815   APPEARANCEUR CLEAR 05/19/2023 1815   APPEARANCEUR Clear 02/09/2023 1433   LABSPEC 1.010 05/19/2023 1815  PHURINE 6.0 05/19/2023 1815   GLUCOSEU NEGATIVE 05/19/2023 1815   HGBUR NEGATIVE 05/19/2023 1815   BILIRUBINUR NEGATIVE 05/19/2023 1815   BILIRUBINUR Negative 02/09/2023 1433   KETONESUR NEGATIVE 05/19/2023 1815   PROTEINUR NEGATIVE 05/19/2023 1815   UROBILINOGEN negative 09/04/2014 1420   UROBILINOGEN 0.2 02/21/2014 2330   NITRITE NEGATIVE 05/19/2023 1815   LEUKOCYTESUR NEGATIVE 05/19/2023 1815    Radiological Exams on Admission: DG Knee Complete 4 Views Right  Result Date: 05/19/2023 CLINICAL DATA:  Fall 3 days ago.  Pain EXAM: RIGHT KNEE - COMPLETE 4 VIEW COMPARISON:  None Available. FINDINGS: No evidence of fracture, dislocation, or joint effusion. No evidence of arthropathy or other focal bone abnormality. Soft tissues are unremarkable. Osteopenia. IMPRESSION: Osteopenia.  No acute osseous abnormality. Electronically Signed   By: Karen Kays M.D.   On: 05/19/2023 18:11   DG Foot Complete Right  Result Date: 05/19/2023 CLINICAL DATA:  Pain after fall.  Fall 3 days ago. EXAM: RIGHT FOOT COMPLETE - 3 VIEW COMPARISON:  None Available. FINDINGS: Osteopenia. No fracture or dislocation. Mild joint space loss with sclerosis along the first metatarsophalangeal joint. IMPRESSION: Osteopenia. Mild degenerative changes of the first metatarsophalangeal joint. Electronically Signed   By: Karen Kays M.D.   On: 05/19/2023 18:10   DG Hip Unilat W or Wo Pelvis 2-3 Views Right  Result Date: 05/19/2023 CLINICAL DATA:  Larey Seat 3 days ago with pain EXAM: DG HIP (WITH OR WITHOUT PELVIS) 3V RIGHT COMPARISON:  X-ray series 04/29/2017. FINDINGS: Osteopenia. No  acute fracture or dislocation. Chronic posttraumatic deformity of the left pubic bone. There is dynamic right hip screw in place. Portions of the hardware are preserved. The distal aspect is not completely included in the imaging field. No acute fracture or dislocation. No hardware failure. Interval placement of augmentation cement along the upper sacrum on both sides. With this level of osteopenia subtle nondisplaced injury is difficult to completely exclude and if there is further concern of acute injury additional cross-sectional imaging study as clinically appropriate. IMPRESSION: Stable surgical changes about the right hip. Chronic deformity of the left pubic bone. Posttreatment changes along the sacrum. Osteopenia. Electronically Signed   By: Karen Kays M.D.   On: 05/19/2023 18:09    EKG: Independently reviewed.  Sinus rhythm, rate 71, PVCs present.  QTc 460.  Pacer spikes present.  No significant ST or T wave change from prior.  Assessment/Plan Principal Problem:   Syncope Active Problems:   Hypertension   Chronic diastolic CHF (congestive heart failure) (HCC)   S/P TAVR (transcatheter aortic valve replacement)   Pulmonary HTN (HCC)  Assessment and Plan: * Syncope 3 syncopal episodes, sudden without prodrome. Orthostatic vitals checked, initial increasing heart rate from 74-102, but on repeat this was not present. -Recent echo 05/2023, EF of 60 to 65% indeterminate LV diastolic parameters, status post TAVR, moderate mitral valve stenosis, severe mitral annular calcification - Pace maker interrogated in the ED -Trend troponin -Obtain head CT -Obtain chest x-ray -Was seen by cardiology 11/6, patient reported frequent fall with brief loss of consciousness , pacemaker interrogated-no cardiac etiology of falls.  Pulmonary HTN (HCC) Resume sildenafil.  S/P TAVR (transcatheter aortic valve replacement) TAVR and pacemaker placement 05/2022.  Chronic diastolic CHF (congestive heart  failure) (HCC) Stable and compensated.  On as needed Lasix.  Hypertension Stable.  -Resume nifedipine   DVT prophylaxis: Lovenox Code Status: FULL code-confirmed with patient and spouse at bedside Family Communication: Spouse at bedside.   Disposition Plan: ~ 2  days Consults called: None Admission status: Obs Tele    Author: Onnie Boer, MD 05/19/2023 11:02 PM  For on call review [email or web address].

## 2023-05-19 NOTE — Assessment & Plan Note (Signed)
TAVR and pacemaker placement 05/2022.

## 2023-05-19 NOTE — Assessment & Plan Note (Signed)
Stable and compensated.  On as needed Lasix.

## 2023-05-19 NOTE — ED Notes (Signed)
Pt denies all pain at this time Completed Pedialyte Orthostatics repeated Pt stated she is dehydrated often and keeps Pedialyte at home

## 2023-05-19 NOTE — ED Provider Notes (Signed)
[place] EMERGENCY DEPARTMENT AT [facility] Provider Note   CSN: [id number] Arrival date & time: 05/19/23  1356     History  Chief Complaint  Patient presents with   Fall    05/16/23    Haley Trevino is a 82 y.o. female with a history including hypertension, hyper lipidemia, history of complete heart block with subsequent pacemaker, aortic valve replacement, scleroderma, hypothyroidism presenting for evaluation of 3 episodes of syncope in the past 2 weeks.  These episodes are without prodrome, last occurring 3 days ago.  She describes she was caring her lunch tray to set on the table and she woke on the floor with the tray items all around her on the floor.  Her husband was downstairs but her fall and by the time he got to her she was still unconscious but quickly regained consciousness.  She has no warning of these episodes.  She does endorse however times where she gets lightheaded and she will sit down and prevent what she suspects might end up being a syncopal event.  Saturday's event was not preceded by lightheadedness, she also denies palpitations.  She has significant pain in her right hip and right foot and knee since Saturday's fall.  She has been ambulatory however.  She has had no treatment for her injuries.  The history is provided by the patient.       Home Medications Prior to Admission medications   Medication Sig Start Date End Date Taking? Authorizing Provider  acetaminophen (TYLENOL) 500 MG tablet Take 1,000 mg by mouth at bedtime.   Yes [provider]  aspirin EC 81 MG tablet Take 81 mg by mouth daily. Swallow whole.   Yes [provider]  clonazePAM (KLONOPIN) 0.5 MG tablet Take 1 tablet (0.5 mg total) by mouth 2 (two) times daily as needed for anxiety. 02/09/23  Yes Martin, Mary-Margaret, FNP  ezetimibe (ZETIA) 10 MG tablet Take 1 tablet (10 mg total) by mouth daily. 02/09/23  Yes Martin, Mary-Margaret, FNP  FLUoxetine (PROZAC) 40 MG  capsule Take 1 capsule (40 mg total) by mouth daily. 02/09/23  Yes Martin, Mary-Margaret, FNP  fluticasone (FLONASE) 50 MCG/ACT nasal spray PLACE 2 SPRAYS INTO BOTH NOSTRILS DAILY Patient taking differently: Place 2 sprays into both nostrils as needed. 03/16/23  Yes Martin, Mary-Margaret, FNP  NIFEdipine (PROCARDIA-XL/NIFEDICAL-XL) 30 MG 24 hr tablet Take 1 tablet (30 mg total) by mouth daily. 02/09/23  Yes Martin, Mary-Margaret, FNP  NONFORMULARY OR COMPOUNDED ITEM Take 1 mg by mouth at bedtime. Low dose naltrexone Patient taking differently: Take 1 mg by mouth at bedtime. Low dose naltrexone Anti-inflammatory with strict diet to avoid inflammation 03/30/23  Yes Raulkar, Drema Pry, MD  omeprazole (PRILOSEC) 20 MG capsule TAKE 1 TABLET DAILY 02/09/23  Yes Daphine Deutscher, Mary-Margaret, FNP  RESTASIS 0.05 % ophthalmic emulsion Place 1 drop into both eyes 2 (two) times daily. 03/26/22  Yes [provider]  sildenafil (REVATIO) 20 MG tablet Take 1 tablet (20 mg total) by mouth 2 (two) times daily. 03/30/23  Yes Martin, Mary-Margaret, FNP  SYNTHROID 75 MCG tablet TAKE (1) TABLET DAILY BE- FORE BREAKFAST. 02/09/23  Yes Martin, Mary-Margaret, FNP  PROLIA 60 MG/ML SOSY injection Inject 60 mg into the skin every 6 (six) months. Patient not taking: Reported on 05/19/2023 04/21/22   Bennie Pierini, FNP      Allergies    Bactrim [sulfamethoxazole-trimethoprim], Dilaudid [hydromorphone hcl], Eliquis [apixaban], Acyclovir and related, Crestor [rosuvastatin calcium], Gabapentin, Aleve [naproxen sodium], and  Lyrica [pregabalin]    Review of Systems   Review of Systems  Constitutional:  Negative for chills and fever.  HENT:  Negative for congestion and sore throat.   Eyes: Negative.   Respiratory:  Negative for chest tightness and shortness of breath.   Cardiovascular:  Negative for chest pain.  Gastrointestinal:  Negative for abdominal pain and nausea.  Genitourinary: Negative.   Musculoskeletal:  Positive  for arthralgias. Negative for joint swelling and neck pain.  Skin: Negative.  Negative for rash and wound.  Neurological:  Positive for syncope. Negative for dizziness, weakness, light-headedness, numbness and headaches.  Psychiatric/Behavioral: Negative.    All other systems reviewed and are negative.   Physical Exam Updated Vital Signs BP 135/68   Pulse 75   Temp 98.4 F (36.9 C) (Oral)   Resp 16   Ht 5\' 2"  (1.575 m)   Wt 48.2 kg   SpO2 95%   BMI 19.44 kg/m  Physical Exam Vitals and nursing note reviewed.  Constitutional:      Appearance: She is well-developed.  HENT:     Head: Normocephalic and atraumatic.  Eyes:     Conjunctiva/sclera: Conjunctivae normal.  Cardiovascular:     Rate and Rhythm: Normal rate and regular rhythm.     Heart sounds: Normal heart sounds.  Pulmonary:     Effort: Pulmonary effort is normal.     Breath sounds: Normal breath sounds. No wheezing.  Abdominal:     General: Bowel sounds are normal.     Palpations: Abdomen is soft.     Tenderness: There is no abdominal tenderness.  Musculoskeletal:        General: Normal range of motion.     Cervical back: Normal range of motion.     Comments: Ttp right lateral hip, no palpable deformity, ROM normal.   Skin:    General: Skin is warm and dry.  Neurological:     General: No focal deficit present.     Mental Status: She is alert and oriented to person, place, and time.     Cranial Nerves: No cranial nerve deficit.     ED Results / Procedures / Treatments   Labs (all labs ordered are listed, but only abnormal results are displayed) Labs Reviewed  URINALYSIS, ROUTINE W REFLEX MICROSCOPIC - Abnormal; Notable for the following components:      Result Value   Color, Urine STRAW (*)    All other components within normal limits  BASIC METABOLIC PANEL - Abnormal; Notable for the following components:   CO2 21 (*)    Glucose, Bld 101 (*)    All other components within normal limits  CBG  MONITORING, ED    EKG None ED ECG REPORT   Date: 05/19/2023  Rate: 71  Rhythm: normal sinus rhythm  QRS Axis: left  Intervals: normal  ST/T Wave abnormalities: nonspecific ST changes  Conduction Disutrbances:none  Narrative Interpretation: PVC's new.    Old EKG Reviewed: changes noted  I have personally reviewed the EKG tracing and agree with the computerized printout as noted.  Radiology DG Knee Complete 4 Views Right  Result Date: 05/19/2023 CLINICAL DATA:  Fall 3 days ago.  Pain EXAM: RIGHT KNEE - COMPLETE 4 VIEW COMPARISON:  None Available. FINDINGS: No evidence of fracture, dislocation, or joint effusion. No evidence of arthropathy or other focal bone abnormality. Soft tissues are unremarkable. Osteopenia. IMPRESSION: Osteopenia.  No acute osseous abnormality. Electronically Signed   By: Karen Kays M.D.   On:  05/19/2023 18:11   DG Foot Complete Right  Result Date: 05/19/2023 CLINICAL DATA:  Pain after fall.  Fall 3 days ago. EXAM: RIGHT FOOT COMPLETE - 3 VIEW COMPARISON:  None Available. FINDINGS: Osteopenia. No fracture or dislocation. Mild joint space loss with sclerosis along the first metatarsophalangeal joint. IMPRESSION: Osteopenia. Mild degenerative changes of the first metatarsophalangeal joint. Electronically Signed   By: Karen Kays M.D.   On: 05/19/2023 18:10   DG Hip Unilat W or Wo Pelvis 2-3 Views Right  Result Date: 05/19/2023 CLINICAL DATA:  Larey Seat 3 days ago with pain EXAM: DG HIP (WITH OR WITHOUT PELVIS) 3V RIGHT COMPARISON:  X-ray series 04/29/2017. FINDINGS: Osteopenia. No acute fracture or dislocation. Chronic posttraumatic deformity of the left pubic bone. There is dynamic right hip screw in place. Portions of the hardware are preserved. The distal aspect is not completely included in the imaging field. No acute fracture or dislocation. No hardware failure. Interval placement of augmentation cement along the upper sacrum on both sides. With this level of  osteopenia subtle nondisplaced injury is difficult to completely exclude and if there is further concern of acute injury additional cross-sectional imaging study as clinically appropriate. IMPRESSION: Stable surgical changes about the right hip. Chronic deformity of the left pubic bone. Posttreatment changes along the sacrum. Osteopenia. Electronically Signed   By: Karen Kays M.D.   On: 05/19/2023 18:09    Procedures Procedures    Medications Ordered in ED Medications - No data to display  ED Course/ Medical Decision Making/ A&P                                 Medical Decision Making Patient presenting with multiple episodes of syncope without prodrome.  She has had 3 specific episodes in the last 2 weeks, last occurring 3 days ago.  She has a significant cardiac history of complete heart block, pacemaker has been interrogated both last week by her cardiologist and again today here.  Last week's interrogation was normal according to her medical records.  Today's interrogation revealed an 8-second episode of atrial tachycardia on November 7 and of 46-second episode today, neither of these episodes correspond with at least Saturday's fall.  Patient is having drop syncope of unclear etiology which does not appear to be cardiac.  She would benefit from admission for further evaluation of the source.  She is borderline tilt positive today and is being given IV fluids.  Amount and/or Complexity of Data Reviewed Labs: ordered.    Details: Labs reviewed and unremarkable. Radiology: ordered.    Details: X-ray of right knee, right foot and right hip reviewed and I agree with interpretation, no acute fracture or dislocation found. Discussion of management or test interpretation with external provider(s): Discussed with Dr. Mariea Clonts who agrees with admission  Risk Decision regarding hospitalization.           Final Clinical Impression(s) / ED Diagnoses Final diagnoses:  Syncope, unspecified  syncope type    Rx / DC Orders ED Discharge Orders     None         Victoriano Lain 05/19/23 2130    Bethann Berkshire, MD 05/22/23 1655

## 2023-05-20 ENCOUNTER — Observation Stay (HOSPITAL_COMMUNITY): Payer: Medicare Other

## 2023-05-20 DIAGNOSIS — I6523 Occlusion and stenosis of bilateral carotid arteries: Secondary | ICD-10-CM | POA: Diagnosis not present

## 2023-05-20 DIAGNOSIS — R55 Syncope and collapse: Secondary | ICD-10-CM | POA: Diagnosis not present

## 2023-05-20 LAB — BASIC METABOLIC PANEL
Anion gap: 10 (ref 5–15)
BUN: 10 mg/dL (ref 8–23)
CO2: 22 mmol/L (ref 22–32)
Calcium: 8.8 mg/dL — ABNORMAL LOW (ref 8.9–10.3)
Chloride: 107 mmol/L (ref 98–111)
Creatinine, Ser: 0.88 mg/dL (ref 0.44–1.00)
GFR, Estimated: >60 mL/min (ref >60)
Glucose, Bld: 111 mg/dL — ABNORMAL HIGH (ref 70–99)
Potassium: 3.9 mmol/L (ref 3.5–5.1)
Sodium: 139 mmol/L (ref 135–145)

## 2023-05-20 LAB — CBC
HCT: 39.4 % (ref 36.0–46.0)
Hemoglobin: 12.7 g/dL (ref 12.0–15.0)
MCH: 28.7 pg (ref 26.0–34.0)
MCHC: 32.2 g/dL (ref 30.0–36.0)
MCV: 89.1 fL (ref 80.0–100.0)
Platelets: 302 10*3/uL (ref 150–400)
RBC: 4.42 MIL/uL (ref 3.87–5.11)
RDW: 15.2 % (ref 11.5–15.5)
WBC: 5 10*3/uL (ref 4.0–10.5)
nRBC: 0 % (ref 0.0–0.2)

## 2023-05-20 MED ORDER — FLUOXETINE HCL 20 MG PO CAPS
40.0000 mg | ORAL_CAPSULE | Freq: Every day | ORAL | Status: DC
Start: 2023-05-20 — End: 2023-05-20
  Administered 2023-05-20: 40 mg via ORAL
  Filled 2023-05-20: qty 2

## 2023-05-20 MED ORDER — CYCLOSPORINE 0.05 % OP EMUL
1.0000 [drp] | Freq: Two times a day (BID) | OPHTHALMIC | Status: DC
  Administered 2023-05-20 (×2): 1 [drp] via OPHTHALMIC
  Filled 2023-05-20 (×2): qty 30

## 2023-05-20 MED ORDER — ONDANSETRON HCL 4 MG/2ML IJ SOLN: 4.0000 mg | Freq: Four times a day (QID) | INTRAMUSCULAR | Status: DC | PRN

## 2023-05-20 MED ORDER — RISAQUAD PO CAPS
2.0000 | ORAL_CAPSULE | Freq: Every day | ORAL | Status: DC
  Administered 2023-05-20: 2 via ORAL
  Filled 2023-05-20: qty 2

## 2023-05-20 MED ORDER — ENOXAPARIN SODIUM 40 MG/0.4ML IJ SOSY
40.0000 mg | PREFILLED_SYRINGE | INTRAMUSCULAR | Status: DC
  Administered 2023-05-20: 40 mg via SUBCUTANEOUS
  Filled 2023-05-20: qty 0.4

## 2023-05-20 MED ORDER — NIFEDIPINE ER OSMOTIC RELEASE 30 MG PO TB24
30.0000 mg | ORAL_TABLET | Freq: Every day | ORAL | Status: DC
Start: 2023-05-20 — End: 2023-05-20
  Administered 2023-05-20: 30 mg via ORAL
  Filled 2023-05-20: qty 1

## 2023-05-20 MED ORDER — PANTOPRAZOLE SODIUM 40 MG PO TBEC
40.0000 mg | DELAYED_RELEASE_TABLET | Freq: Every day | ORAL | Status: DC
  Administered 2023-05-20: 40 mg via ORAL
  Filled 2023-05-20: qty 1

## 2023-05-20 MED ORDER — ASPIRIN 81 MG PO TBEC
81.0000 mg | DELAYED_RELEASE_TABLET | Freq: Every day | ORAL | Status: DC
  Administered 2023-05-20: 81 mg via ORAL
  Filled 2023-05-20: qty 1

## 2023-05-20 MED ORDER — LEVOTHYROXINE SODIUM 75 MCG PO TABS
75.0000 ug | ORAL_TABLET | Freq: Every day | ORAL | Status: DC
  Administered 2023-05-20: 75 ug via ORAL
  Filled 2023-05-20: qty 1

## 2023-05-20 MED ORDER — POLYETHYLENE GLYCOL 3350 17 G PO PACK: 17.0000 g | PACK | Freq: Every day | ORAL | Status: DC | PRN

## 2023-05-20 MED ORDER — SODIUM CHLORIDE 0.9 % IV BOLUS
250.0000 mL | Freq: Once | INTRAVENOUS | Status: AC
  Administered 2023-05-20: 250 mL via INTRAVENOUS

## 2023-05-20 MED ORDER — ACETAMINOPHEN 650 MG RE SUPP: 650.0000 mg | Freq: Four times a day (QID) | RECTAL | Status: DC | PRN

## 2023-05-20 MED ORDER — ACETAMINOPHEN 325 MG PO TABS
650.0000 mg | ORAL_TABLET | Freq: Four times a day (QID) | ORAL | Status: DC | PRN
  Administered 2023-05-20: 650 mg via ORAL
  Filled 2023-05-20: qty 2

## 2023-05-20 MED ORDER — SILDENAFIL CITRATE 20 MG PO TABS
20.0000 mg | ORAL_TABLET | Freq: Two times a day (BID) | ORAL | Status: DC
  Administered 2023-05-20 (×2): 20 mg via ORAL
  Filled 2023-05-20 (×2): qty 1

## 2023-05-20 MED ORDER — ONDANSETRON HCL 4 MG PO TABS: 4.0000 mg | ORAL_TABLET | Freq: Four times a day (QID) | ORAL | Status: DC | PRN

## 2023-05-20 MED ORDER — RISAQUAD PO CAPS: 2.0000 | ORAL_CAPSULE | Freq: Every day | ORAL | Status: DC

## 2023-05-20 MED ORDER — CLONAZEPAM 0.5 MG PO TABS
0.5000 mg | ORAL_TABLET | Freq: Two times a day (BID) | ORAL | Status: DC | PRN
  Administered 2023-05-20: 0.5 mg via ORAL
  Filled 2023-05-20: qty 1

## 2023-05-20 NOTE — Progress Notes (Deleted)
Transition of Care Department [facility]) has reviewed patient and no TOC needs have been identified at this time. We will continue to monitor patient advancement through interdisciplinary progression rounds. If new patient transition needs arise, please place a TOC consult.   05/20/23 0810  TOC Brief Assessment  Insurance and Status Reviewed  Patient has primary care physician Yes  Home environment has been reviewed Lives with wife.  Prior level of function: Fairly independent.  Prior/Current Home Services No current home services  Social Determinants of Health Reivew SDOH reviewed no interventions necessary  Readmission risk has been reviewed Yes  Transition of care needs no transition of care needs at this time

## 2023-05-20 NOTE — Progress Notes (Signed)
Transition of Care Department [facility]) has reviewed patient and no TOC needs have been identified at this time. We will continue to monitor patient advancement through interdisciplinary progression rounds. If new patient transition needs arise, please place a TOC consult.    05/20/23 1420  TOC Brief Assessment  Insurance and Status Reviewed  Patient has primary care physician Yes  Home environment has been reviewed Lives with husband.  Prior level of function: Fairly independent.  Prior/Current Home Services No current home services  Social Determinants of Health Reivew SDOH reviewed no interventions necessary  Readmission risk has been reviewed Yes  Transition of care needs no transition of care needs at this time

## 2023-05-20 NOTE — Discharge Summary (Signed)
Physician Discharge Summary  Haley Trevino QIH:[id number] DOB: [birth year] DOA: 05/19/2023  PCP: Bennie Pierini, FNP  Admit date: 05/19/2023 Discharge date: 05/20/2023  Admitted From: home Discharge disposition: home   Recommendations for Outpatient Follow-Up:   Follow carotid duplex outpatient  Stay hydrated Resume outpatient PT   Discharge Diagnosis:   Principal Problem:   Syncope Active Problems:   Hypertension   Chronic diastolic CHF (congestive heart failure) (HCC)   S/P TAVR (transcatheter aortic valve replacement)   Pulmonary HTN (HCC)    Discharge Condition: Improved.  Diet recommendation: resume prior diet  Wound care: None.  Code status: Full.   History of Present Illness:   Haley Trevino is a 82 y.o. female with medical history significant for complete heart block and pacemaker status, hypertension, TAVR, diastolic CHF, scleroderma, depression. Patient presented to the ED with complaints of 3 syncopal episodes over the past 2 weeks.  The last episode which she cleared remembers was just 3 days ago, and before that her last syncopal episode was a week prior to that.  She reports 3 days ago, she was standing holding her food tray, she had just dropped it on the counter, and neck she knows she was on the floor, with the contents of heart rate started on the floor.  She does not remember falling.  She denies prodrome.  She is not sure if she lost consciousness.  She does not think he hit her head. No chest pain.  She reports chronic unchanged difficulty breathing with exertion-since her cardiac surgery a year ago, for which she has had workup as outpatient that was unremarkable. She reports intermittent dizziness, but with her falls she did not have prior dizziness.  She reports good oral intake, no vomiting, reports 2 diarrheal episodes today.  She is on Lasix as needed.   ED Course: Temperature 98.4.  Heart rate 60s to 80.  Respiratory  rate 16-18.  Blood pressure systolic 133-168.  O2 sats 90 to 96% on room air. Pacemaker interrogated in the ED 2 episodes of atrial tachycardia second episode was today rates up to 140s, lasted about 40 seconds. Orthostatic vitals checked, initial increasing heart rate from 74-102, but on repeat this was not present.   Hospital Course by Problem:    Syncope 3 syncopal episodes, sudden without prodrome. Orthostatic vitals checked, initial increasing heart rate from 74-102, but on repeat this was not present. -Recent echo 05/2023, EF of 60 to 65% indeterminate LV diastolic parameters, status post TAVR, moderate mitral valve stenosis, severe mitral annular calcification -Was seen by cardiology 11/6, patient reported frequent fall with brief loss of consciousness , pacemaker interrogated-no cardiac etiology of falls.  -carotid duplex pending official read -suspect related to poor PO intake and occasional loose stool  Pulmonary HTN (HCC) Resume sildenafil.   S/P TAVR (transcatheter aortic valve replacement) TAVR and pacemaker placement 05/2022.   Chronic diastolic CHF (congestive heart failure) (HCC) Stable and compensated.  On as needed Lasix.   Hypertension Stable.  -Resume nifedipine    Medical Consultants:      Discharge Exam:   Vitals:   05/20/23 0958 05/20/23 1019  BP: (!) 120/92   Pulse: (!) 105   Resp: 18   Temp: 98.7 F (37.1 C)   SpO2: 92% 99%   Vitals:   05/20/23 0217 05/20/23 0619 05/20/23 0958 05/20/23 1019  BP: (!) 157/81 (!) 143/72 (!) 120/92   Pulse: (!) 105 77 (!) 105   Resp: 18  18   Temp: 98.1 F (36.7 C) 98.7 F (37.1 C) 98.7 F (37.1 C)   TempSrc: Oral Oral Oral   SpO2: 94% 92% 92% 99%  Weight:      Height:        General exam: Appears calm and comfortable.    The results of significant diagnostics from this hospitalization (including imaging, microbiology, ancillary and laboratory) are listed below for reference.     Procedures and  Diagnostic Studies:   CT HEAD WO CONTRAST ( )  Result Date: 05/20/2023 CLINICAL DATA:  Initial evaluation for multiple syncopal episodes, falls. Altered mental status. EXAM: CT HEAD WITHOUT CONTRAST TECHNIQUE: Contiguous axial images were obtained from the base of the skull through the vertex without intravenous contrast. RADIATION DOSE REDUCTION: This exam was performed according to the departmental dose-optimization program which includes automated exposure control, adjustment of the mA and/or kV according to patient size and/or use of iterative reconstruction technique. COMPARISON:  Prior study from 10/31/2022. FINDINGS: Brain: Cerebral volume within normal limits. No acute intracranial hemorrhage. No acute large vessel territory infarct. No mass lesion or midline shift. No hydrocephalus or extra-axial fluid collection. 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 Vascular: No abnormal hyperdense vessel. Scattered calcified atherosclerosis present at the skull base. Skull: Scalp soft tissues demonstrate no acute finding. Calvarium intact. Sinuses/Orbits: Globes and orbital soft tissues within normal limits. Visualized paranasal sinuses are largely clear. Mastoid air cells and middle ear cavities are well pneumatized and free of fluid. Other: None. IMPRESSION: Stable head CT.  No acute intracranial abnormality identified. Electronically Signed   By: Rise Mu M.D.   On: 05/20/2023 06:12   DG CHEST PORT 1 VIEW  Result Date: 05/20/2023 CLINICAL DATA:  106001.  Syncopal episode.  Anticipate discharge. EXAM: PORTABLE CHEST 1 VIEW COMPARISON:  PA and lateral chest 10/31/2022 FINDINGS: The heart is upper limits of normal for size. No vascular congestion is seen. No pleural effusion. The lungs are clear. A TAVR is again noted. There is aortic tortuosity and calcification with stable mediastinum, stable left chest dual lead pacing system and wire insertions. Nipple shadows  superimpose over the lung bases. Osteopenia and degenerative change of the spine again is seen with kyphoplasty at 2 noncontiguous midthoracic spine levels. Mild levoscoliosis. IMPRESSION: 1. No evidence of acute chest disease.  Unchanged. 2. Aortic atherosclerosis.  TAVR and pacemaker. 3. Osteopenia and degenerative change of the spine with kyphoplasty at 2 noncontiguous midthoracic spine levels. Electronically Signed   By: Almira Bar M.D.   On: 05/20/2023 03:18   DG Knee Complete 4 Views Right  Result Date: 05/19/2023 CLINICAL DATA:  Fall 3 days ago.  Pain EXAM: RIGHT KNEE - COMPLETE 4 VIEW COMPARISON:  None Available. FINDINGS: No evidence of fracture, dislocation, or joint effusion. No evidence of arthropathy or other focal bone abnormality. Soft tissues are unremarkable. Osteopenia. IMPRESSION: Osteopenia.  No acute osseous abnormality. Electronically Signed   By: Karen Kays M.D.   On: 05/19/2023 18:11   DG Foot Complete Right  Result Date: 05/19/2023 CLINICAL DATA:  Pain after fall.  Fall 3 days ago. EXAM: RIGHT FOOT COMPLETE - 3 VIEW COMPARISON:  None Available. FINDINGS: Osteopenia. No fracture or dislocation. Mild joint space loss with sclerosis along the first metatarsophalangeal joint. IMPRESSION: Osteopenia. Mild degenerative changes of the first metatarsophalangeal joint. Electronically Signed   By: Karen Kays M.D.   On: 05/19/2023 18:10   DG Hip Unilat W or Wo Pelvis 2-3 Views Right  Result Date: 05/19/2023 CLINICAL DATA:  Larey Seat 3 days ago with pain EXAM: DG HIP (WITH OR WITHOUT PELVIS) 3V RIGHT COMPARISON:  X-ray series 04/29/2017. FINDINGS: Osteopenia. No acute fracture or dislocation. Chronic posttraumatic deformity of the left pubic bone. There is dynamic right hip screw in place. Portions of the hardware are preserved. The distal aspect is not completely included in the imaging field. No acute fracture or dislocation. No hardware failure. Interval placement of augmentation  cement along the upper sacrum on both sides. With this level of osteopenia subtle nondisplaced injury is difficult to completely exclude and if there is further concern of acute injury additional cross-sectional imaging study as clinically appropriate. IMPRESSION: Stable surgical changes about the right hip. Chronic deformity of the left pubic bone. Posttreatment changes along the sacrum. Osteopenia. Electronically Signed   By: Karen Kays M.D.   On: 05/19/2023 18:09     Labs:   Basic Metabolic Panel: Recent Labs  Lab 05/19/23 1342 05/20/23 1053  NA 137 139  K 4.0 3.9  CL 106 107  CO2 21* 22  GLUCOSE 101* 111*  BUN 10 10  CREATININE 0.85 0.88  CALCIUM 9.1 8.8*   GFR Estimated Creatinine Clearance: 38.3 mL/min (by C-G formula based on SCr of 0.88 mg/dL). Liver Function Tests: No results for input(s): "AST", "ALT", "ALKPHOS", "BILITOT", "PROT", "ALBUMIN" in the last 168 hours. No results for input(s): "LIPASE", "AMYLASE" in the last 168 hours. No results for input(s): "AMMONIA" in the last 168 hours. Coagulation profile No results for input(s): "INR", "PROTIME" in the last 168 hours.  CBC: Recent Labs  Lab 05/19/23 1342 05/20/23 1053  WBC 6.1 5.0  NEUTROABS 4.4  --   HGB 12.5 12.7  HCT 39.7 39.4  MCV 90.6 89.1  PLT 289 302   Cardiac Enzymes: No results for input(s): "CKTOTAL", "CKMB", "CKMBINDEX", "TROPONINI" in the last 168 hours. BNP: Invalid input(s): "POCBNP" CBG: Recent Labs  Lab 05/19/23 1441  GLUCAP 86   D-Dimer No results for input(s): "DDIMER" in the last 72 hours. Hgb A1c No results for input(s): "HGBA1C" in the last 72 hours. Lipid Profile No results for input(s): "CHOL", "HDL", "LDLCALC", "TRIG", "CHOLHDL", "LDLDIRECT" in the last 72 hours. Thyroid function studies No results for input(s): "TSH", "T4TOTAL", "T3FREE", "THYROIDAB" in the last 72 hours.  Invalid input(s): "FREET3" Anemia work up Recent Labs    05/19/23 1342  FERRITIN 97  TIBC  307  IRON 60   Microbiology No results found for this or any previous visit (from the past 240 hour(s)).   Discharge Instructions:   Discharge Instructions     Diet - low sodium heart healthy   Complete by: As directed    Discharge instructions   Complete by: As directed    Be sure to stay hydrated Also at first sign of dizziness- lay flat on the ground   Increase activity slowly   Complete by: As directed       Allergies as of 05/20/2023       Reactions   Bactrim [sulfamethoxazole-trimethoprim] Nausea And Vomiting   Dilaudid [hydromorphone Hcl] Anaphylaxis   Eliquis [apixaban] Other (See Comments)   Caused bleed   Acyclovir And Related Nausea And Vomiting   Crestor [rosuvastatin Calcium] Other (See Comments)   Muscle pain   Gabapentin Nausea And Vomiting, Swelling   Swelling in feet and in legs   Aleve [naproxen Sodium] Rash   Lyrica [pregabalin] Other (See Comments)   Swelling in feet, and in legs  Medication List     STOP taking these medications    Prolia 60 MG/ML Sosy injection Generic drug: denosumab       TAKE these medications    acetaminophen 500 MG tablet Commonly known as: TYLENOL Take 1,000 mg by mouth at bedtime.   acidophilus Caps capsule Take 2 capsules by mouth daily. Start taking on: May 21, 2023   aspirin EC 81 MG tablet Take 81 mg by mouth daily. Swallow whole.   clonazePAM 0.5 MG tablet Commonly known as: KLONOPIN Take 1 tablet (0.5 mg total) by mouth 2 (two) times daily as needed for anxiety.   ezetimibe 10 MG tablet Commonly known as: Zetia Take 1 tablet (10 mg total) by mouth daily.   FLUoxetine 40 MG capsule Commonly known as: PROZAC Take 1 capsule (40 mg total) by mouth daily.   fluticasone 50 MCG/ACT nasal spray Commonly known as: FLONASE PLACE 2 SPRAYS INTO BOTH NOSTRILS DAILY What changed:  when to take this reasons to take this   NIFEdipine 30 MG 24 hr tablet Commonly known as:  PROCARDIA-XL/NIFEDICAL-XL Take 1 tablet (30 mg total) by mouth daily.   NONFORMULARY OR COMPOUNDED ITEM Take 1 mg by mouth at bedtime. Low dose naltrexone What changed: additional instructions   omeprazole 20 MG capsule Commonly known as: PRILOSEC TAKE 1 TABLET DAILY   Restasis 0.05 % ophthalmic emulsion Generic drug: cycloSPORINE Place 1 drop into both eyes 2 (two) times daily.   sildenafil 20 MG tablet Commonly known as: REVATIO Take 1 tablet (20 mg total) by mouth 2 (two) times daily.   Synthroid 75 MCG tablet Generic drug: levothyroxine TAKE (1) TABLET DAILY BE- FORE BREAKFAST.        Follow-up Information     Daphine Deutscher, Mary-Margaret, FNP Follow up in 1 week(s).   Specialty: Family Medicine Contact information: [address] [place] [address] [phone]                  Time coordinating discharge: 45 min  Signed:  Joseph Art DO  [facility] 05/20/2023, 3:23 PM

## 2023-05-20 NOTE — Discharge Instructions (Signed)
No driving °

## 2023-05-21 ENCOUNTER — Telehealth: Payer: Self-pay

## 2023-05-21 NOTE — Transitions of Care (Post Inpatient/ED Visit) (Cosign Needed)
 05/21/2023  Name: Haley Trevino MRN: [id number] DOB: 02-24-1941  Today's TOC FU Call Status: Today's TOC FU Call Status:: Successful TOC FU Call Completed TOC FU Call Complete Date: 05/21/23 Patient's Name and Date of Birth confirmed.  Transition Care Management Follow-up Telephone Call Date of Discharge: 05/20/23 Dis[facility] Type of Discharge: Inpatient Admission Primary Inpatient Discharge Diagnosis:: syncope and collapse How have you been since you were released from the hospital?: Same (pt states she is still very sore from falling and she hurt her hip and has a history of hip surgery) Any questions or concerns?: No  Items Reviewed: Did you receive and understand the discharge instructions provided?: Yes Medications obtained,verified, and reconciled?: Yes (Medications Reviewed) Any new allergies since your discharge?: No Dietary orders reviewed?: Yes Type of Diet Ordered:: heart healthy low sodium Do you have support at home?: Yes People in Home: spouse  Medications Reviewed Today: Medications Reviewed Today     Reviewed by Dolly Harbach, Jordan Hawks, CMA (Certified Medical Assistant) on 05/21/23 at 1029  Med List Status: <None>   Medication Order Taking? Sig Documenting Provider Last Dose Status Informant  acetaminophen (TYLENOL) 500 MG tablet [id number] No Take 1,000 mg by mouth at bedtime. [provider] 05/18/2023 Active Self  acidophilus (RISAQUAD) CAPS capsule [id number]  Take 2 capsules by mouth daily. Joseph Art, DO  Active   aspirin EC 81 MG tablet [id number] No Take 81 mg by mouth daily. Swallow whole. [provider] 05/19/2023 0900 Active Self  clonazePAM (KLONOPIN) 0.5 MG tablet [id number] No Take 1 tablet (0.5 mg total) by mouth 2 (two) times daily as needed for anxiety. Daphine Deutscher Mary-Margaret, FNP 05/19/2023 Active Self  ezetimibe (ZETIA) 10 MG tablet [id number] No Take 1 tablet (10 mg total) by mouth daily. Daphine Deutscher  Mary-Margaret, FNP 05/18/2023 Active Self  FLUoxetine (PROZAC) 40 MG capsule [id number] No Take 1 capsule (40 mg total) by mouth daily. Daphine Deutscher, Mary-Margaret, FNP 05/19/2023 Active Self  fluticasone (FLONASE) 50 MCG/ACT nasal spray [id number] No PLACE 2 SPRAYS INTO BOTH NOSTRILS DAILY  Patient taking differently: Place 2 sprays into both nostrils as needed.   Bennie Pierini, FNP Past Month Active Self  NIFEdipine (PROCARDIA-XL/NIFEDICAL-XL) 30 MG 24 hr tablet [id number] No Take 1 tablet (30 mg total) by mouth daily. Bennie Pierini, FNP 05/19/2023 Active Self  NONFORMULARY OR COMPOUNDED ITEM [id number] No Take 1 mg by mouth at bedtime. Low dose naltrexone  Patient taking differently: Take 1 mg by mouth at bedtime. Low dose naltrexone Anti-inflammatory with strict diet to avoid inflammation   Horton Chin, MD 05/18/2023 2000 Active Self  omeprazole (PRILOSEC) 20 MG capsule [id number] No TAKE 1 TABLET DAILY Bennie Pierini, FNP 05/19/2023 Active Self  PROLIA 60 MG/ML SOSY injection [id number] No Inject 60 mg into the skin every 6 (six) months.  Patient not taking: Reported on 05/19/2023   Bennie Pierini, FNP Not Taking Active Self  RESTASIS 0.05 % ophthalmic emulsion [id number] No Place 1 drop into both eyes 2 (two) times daily. [provider] 05/19/2023 Active Self  sildenafil (REVATIO) 20 MG tablet [id number] No Take 1 tablet (20 mg total) by mouth 2 (two) times daily. Daphine Deutscher, Mary-Margaret, FNP 05/19/2023 Active Self  SYNTHROID 75 MCG tablet [id number] No TAKE (1) TABLET DAILY BE- FORE BREAKFAST. Bennie Pierini, FNP 05/19/2023 Active Self            Home Care and Equipment/Supplies: Were Home Health Services Ordered?: NA Any new equipment or medical supplies ordered?: NA  Functional Questionnaire: Do you need assistance with bathing/showering or dressing?: No Do you need assistance with meal preparation?: No Do you need assistance with eating?:  No Do you have difficulty maintaining continence: No Do you need assistance with getting out of bed/getting out of a chair/moving?: No Do you have difficulty managing or taking your medications?: No  Follow up appointments reviewed: PCP Follow-up appointment confirmed?: No (providers schedule would not allow me to schedule patint on DOD schedule this early. Message sent to provider) MD Provider Line Number:[phone] Given: Yes Specialist Hospital Follow-up appointment confirmed?: NA Do you need transportation to your follow-up appointment?: No Do you understand care options if your condition(s) worsen?: Yes-patient verbalized understanding     Pasty Manninen, CMA  [facility] AWV Team Direct Dial: [phone]

## 2023-05-25 ENCOUNTER — Telehealth: Payer: Self-pay | Admitting: Family Medicine

## 2023-05-25 NOTE — Progress Notes (Unsigned)
VIRTUAL VISIT via TELEPHONE NOTE [facility]   I connected with Haley Trevino  on 05/26/23 at 8:08 AM by telephone and verified that I am speaking with the correct person using two identifiers.  Location: Patient: Home Provider: [facility]   I discussed the limitations, risks, security and privacy concerns of performing an evaluation and management service by telephone and the availability of in person appointments. I also discussed with the patient that there may be a patient responsible charge related to this service. The patient expressed understanding and agreed to proceed.  REASON FOR VISIT:  Follow-up for iron deficiency anemia   CURRENT THERAPY: IV iron infusions with intermittent PRBC transfusions  INTERVAL HISTORY:  Haley Trevino is contacted today for follow-up of iron deficiency anemia.  She was last evaluated via telemedicine visit by Rojelio Brenner PA-C 03/11/2023.  She received Venofer 900 mg in September 2024.  Since her last visit, she was hospitalized from 05/19/2023 through 05/20/2023 due to syncope.  At today's visit, she reports feeling poorly due to ongoing fatigue and diffuse musculoskeletal pain from her fibromyalgia.  She also reports having had four falls since her last visit.  She also reports vertigo, tremors, general malaise, and headaches.  She has chronic dyspnea on exertion, but she denies any exertional chest pain.  She remains off of Eliquis to date, but is taking 81 mg aspirin.  She notes occasional dark stool, but denies any gross hematochezia or profuse melena.  She had previously lost some weight following TAVR in November 2023, but is now back up to her baseline weight.   She reports 25% energy and 25% appetite.   REVIEW OF SYSTEMS:   Review of Systems  Constitutional:  Positive for malaise/fatigue. Negative for chills, diaphoresis, fever and weight loss.  Respiratory:  Positive for shortness of breath.  Negative for cough.   Cardiovascular:  Positive for chest pain (musculoskeletal chest pain). Negative for palpitations.  Gastrointestinal:  Positive for diarrhea. Negative for abdominal pain, blood in stool, melena, nausea and vomiting.  Musculoskeletal:  Positive for back pain, joint pain, myalgias and neck pain.  Neurological:  Positive for dizziness, tingling, sensory change (itching) and headaches.  Psychiatric/Behavioral:  Positive for depression. The patient is nervous/anxious and has insomnia.     PHYSICAL EXAM: (per limitations of virtual telephone visit)  The patient is alert and oriented x 3, exhibiting adequate mentation, good mood, and ability to speak in full sentences and execute sound judgement.   ASSESSMENT & PLAN:  1.    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 - Was placed on Eliquis for new onset A-fib/flutter in November 2023, but developed profuse melanotic stool within 48 hours of starting Eliquis.  Patient self discontinued her Eliquis and within 24 hours of stopping Eliquis her bowel movements had returned to normal. - Severe anemia with Hgb 6.6 on 06/09/2022,  therefore transfused PRBC x 2 units. - She continues to follow with GI (Dr. Ewing Schlein via [facility]) - Most recent IV iron with Venofer  300 mg x 3 in September 2024 - Denies any recent melena or hematochezia - Labs (05/19/2023): Hgb 12.5/MCV 90.6, ferritin 97, iron saturation 20%.  Previous labs showed normal creatinine/GFR. - Differential diagnosis favors iron deficiency anemia secondary to chronic GI blood loss; may also be an aspect of malabsorption in the setting of scleroderma, low meat intake, and use of PPI - PLAN: No IV iron at this time.   - Labs (CBC/D, BB sample, ferritin, iron/TIBC) and RTC in 3 months with OFFICE visit   PLAN SUMMARY: >> Labs in 3 months = CBC/D, BB sample, ferritin, iron/TIBC >> OFFICE visit in 3 months  ** Last office visit 08/21/2022     I discussed the assessment and treatment plan with the patient. The patient was provided an opportunity to ask questions and all were answered. The patient agreed with the plan and demonstrated an understanding of the instructions.   The patient was advised to call back or seek an in-person evaluation if the symptoms worsen or if the condition fails to improve as anticipated.  I provided 22 minutes of non-face-to-face time during this encounter.  Carnella Guadalajara, PA-C 05/26/23 8:30 AM

## 2023-05-25 NOTE — Telephone Encounter (Signed)
Copied from CRM [id number]. Topic: Clinical - Medical Advice >> May 25, 2023  8:38 AM Theodis Sato wrote: Reason for CRM: PT states her blood pressure has not been steady and that a different doctor she sees changes her dosage but she would like [facility] approval.

## 2023-05-25 NOTE — Telephone Encounter (Signed)
Spoke with patient - they dropped her nifedipine to 30mg  daily and blood pressure has been running 150-180 systolic. Needs to go back up on nifidipine. Keep diary of blood pressure. Bring readings with you on friday

## 2023-05-25 NOTE — Telephone Encounter (Signed)
Wat did they change her from and to?

## 2023-05-26 ENCOUNTER — Inpatient Hospital Stay: Payer: Medicare Other | Admitting: Physician Assistant

## 2023-05-26 DIAGNOSIS — D500 Iron deficiency anemia secondary to blood loss (chronic): Secondary | ICD-10-CM

## 2023-05-29 ENCOUNTER — Ambulatory Visit: Payer: Medicare Other | Admitting: Nurse Practitioner

## 2023-05-29 ENCOUNTER — Encounter: Payer: Self-pay | Admitting: Nurse Practitioner

## 2023-05-29 VITALS — BP 106/59 | HR 66 | Temp 98.3°F | Ht 62.0 in | Wt 106.0 lb

## 2023-05-29 DIAGNOSIS — R55 Syncope and collapse: Secondary | ICD-10-CM | POA: Diagnosis not present

## 2023-05-29 DIAGNOSIS — E860 Dehydration: Secondary | ICD-10-CM

## 2023-05-29 DIAGNOSIS — I10 Essential (primary) hypertension: Secondary | ICD-10-CM

## 2023-05-29 DIAGNOSIS — Z789 Other specified health status: Secondary | ICD-10-CM

## 2023-05-29 DIAGNOSIS — Z23 Encounter for immunization: Secondary | ICD-10-CM | POA: Diagnosis not present

## 2023-05-29 NOTE — Patient Instructions (Signed)
Fall Prevention in the Home, Adult Falls can cause injuries and can happen to people of all ages. There are many things you can do to make your home safer and to help prevent falls. What actions can I take to prevent falls? General information Use good lighting in all rooms. Make sure to: Replace any light bulbs that burn out. Turn on the lights in dark areas and use night-lights. Keep items that you use often in easy-to-reach places. Lower the shelves around your home if needed. Move furniture so that there are clear paths around it. Do not use throw rugs or other things on the floor that can make you trip. If any of your floors are uneven, fix them. Add color or contrast paint or tape to clearly mark and help you see: Grab bars or handrails. First and last steps of staircases. Where the edge of each step is. If you use a ladder or stepladder: Make sure that it is fully opened. Do not climb a closed ladder. Make sure the sides of the ladder are locked in place. Have someone hold the ladder while you use it. Know where your pets are as you move through your home. What can I do in the bathroom?     Keep the floor dry. Clean up any water on the floor right away. Remove soap buildup in the bathtub or shower. Buildup makes bathtubs and showers slippery. Use non-skid mats or decals on the floor of the bathtub or shower. Attach bath mats securely with double-sided, non-slip rug tape. If you need to sit down in the shower, use a non-slip stool. Install grab bars by the toilet and in the bathtub and shower. Do not use towel bars as grab bars. What can I do in the bedroom? Make sure that you have a light by your bed that is easy to reach. Do not use any sheets or blankets on your bed that hang to the floor. Have a firm chair or bench with side arms that you can use for support when you get dressed. What can I do in the kitchen? Clean up any spills right away. If you need to reach something  above you, use a step stool with a grab bar. Keep electrical cords out of the way. Do not use floor polish or wax that makes floors slippery. What can I do with my stairs? Do not leave anything on the stairs. Make sure that you have a light switch at the top and the bottom of the stairs. Make sure that there are handrails on both sides of the stairs. Fix handrails that are broken or loose. Install non-slip stair treads on all your stairs if they do not have carpet. Avoid having throw rugs at the top or bottom of the stairs. Choose a carpet that does not hide the edge of the steps on the stairs. Make sure that the carpet is firmly attached to the stairs. Fix carpet that is loose or worn. What can I do on the outside of my home? Use bright outdoor lighting. Fix the edges of walkways and driveways and fix any cracks. Clear paths of anything that can make you trip, such as tools or rocks. Add color or contrast paint or tape to clearly mark and help you see anything that might make you trip as you walk through a door, such as a raised step or threshold. Trim any bushes or trees on paths to your home. Check to see if handrails are loose   or broken and that both sides of all steps have handrails. Install guardrails along the edges of any raised decks and porches. Have leaves, snow, or ice cleared regularly. Use sand, salt, or ice melter on paths if you live where there is ice and snow during the winter. Clean up any spills in your garage right away. This includes grease or oil spills. What other actions can I take? Review your medicines with your doctor. Some medicines can cause dizziness or changes in blood pressure, which increase your risk of falling. Wear shoes that: Have a low heel. Do not wear high heels. Have rubber bottoms and are closed at the toe. Feel good on your feet and fit well. Use tools that help you move around if needed. These include: Canes. Walkers. Scooters. Crutches. Ask  your doctor what else you can do to help prevent falls. This may include seeing a physical therapist to learn to do exercises to move better and get stronger. Where to find more information Centers for Disease Control and Prevention, [employer]: cdc.gov National Institute on Aging: nia.nih.gov National Institute on Aging: nia.nih.gov Contact a doctor if: You are afraid of falling at home. You feel weak, drowsy, or dizzy at home. You fall at home. Get help right away if you: Lose consciousness or have trouble moving after a fall. Have a fall that causes a head injury. These symptoms may be an emergency. Get help right away. Call 911. Do not wait to see if the symptoms will go away. Do not drive yourself to the hospital. This information is not intended to replace advice given to you by your health care provider. Make sure you discuss any questions you have with your health care provider. Document Revised: 02/24/2022 Document Reviewed: 02/24/2022 Elsevier Patient Education  2024 Elsevier Inc.  

## 2023-05-29 NOTE — Addendum Note (Signed)
Addended by: Bennie Pierini on: 05/29/2023 11:51 AM   Modules accepted: Level of Service

## 2023-05-29 NOTE — Progress Notes (Signed)
Subjective:    Patient ID: Haley Trevino, female    DOB: [birth year], 82 y.o.   MRN: [id number]   Chief Complaint: medication issues  HPI  Today's visit was for Transitional Care Management.  The patient was discharged from [facility] on 05/20/23 with a primary diagnosis of syncope.   Contact with the patient and/or caregiver, by a clinical staff member, was made on 05/21/23 and was documented as a telephone encounter within the EMR.  Through chart review and discussion with the patient I have determined that management of their condition is of high complexity.     Patient was seen in hospital 05/19/23. Patient had syncopal episode at home. No acute injury. They dropped her nifedipine to 30mg  daily. She was taking this for her raynauds more so then blood pressure. Blood pressure was running 150-180 systolic. She was told on Monday 05/25/23 to go back up on her nefidipine dose to 60mg  a day. Today she says her blood sugars have been running around 140-150. She is still having dizzy spells. She was told by hospital that she is dehydrated. Having trouble walking. Patient Active Problem List   Diagnosis Date Noted   Syncope 05/19/2023   Pulmonary HTN (HCC) 12/18/2022   Heart block AV complete (HCC) 05/27/2022   S/P TAVR (transcatheter aortic valve replacement) 05/20/2022   Anxiety 05/14/2022   Hyperkalemia 05/14/2022   Chronic diastolic CHF (congestive heart failure) (HCC) 05/13/2022   Acute respiratory distress 05/13/2022   Nonrheumatic aortic valve stenosis 04/28/2022   Precordial chest pain 03/13/2022   History of adenomatous polyp of colon 09/21/2020   Weight loss 09/21/2020   Irritable bowel syndrome 09/21/2020   Breast implant rupture 09/04/2020   Iron deficiency anemia due to chronic blood loss 08/28/2020   DOE (dyspnea on exertion) 11/18/2017   Murmur 11/18/2017   Impingement syndrome of left shoulder region 12/08/2014   Osteoporosis 09/20/2014   Hip fracture  requiring operative repair (HCC) 02/22/2014   Hypothyroidism 11/29/2013   Depression 11/29/2013   GERD (gastroesophageal reflux disease) 11/29/2013   Hypertension 11/29/2013   Hyperlipidemia with target LDL less than 100 11/29/2013   Thyroid cyst 04/26/2013   Melanoma of skin (HCC) 08/07/2010   [name] 08/06/2010   SCLERODERMA 08/06/2010   Osteoarthritis 08/06/2010       Review of Systems  Constitutional:  Negative for diaphoresis.  Eyes:  Negative for pain.  Respiratory:  Negative for shortness of breath.   Cardiovascular:  Negative for chest pain, palpitations and leg swelling.  Gastrointestinal:  Negative for abdominal pain.  Endocrine: Negative for polydipsia.  Skin:  Negative for rash.  Neurological:  Negative for dizziness, weakness and headaches.  Hematological:  Does not bruise/bleed easily.  All other systems reviewed and are negative.      Objective:   Physical Exam Constitutional:      Appearance: Normal appearance.  Cardiovascular:     Rate and Rhythm: Normal rate and regular rhythm.     Heart sounds: Normal heart sounds.  Pulmonary:     Breath sounds: Normal breath sounds.  Musculoskeletal:     Comments: In wheelchair Stood  to weight  but feels weak  Skin:    General: Skin is warm.  Neurological:     General: No focal deficit present.     Mental Status: She is alert and oriented to person, place, and time.  Psychiatric:        Mood and Affect: Mood normal.  Behavior: Behavior normal.    BP (!) 106/59   Pulse 66   Temp 98.3 F (36.8 C) (Temporal)   Ht 5\' 2"  (1.575 m)   Wt 106 lb (48.1 kg)   SpO2 96%   BMI 19.39 kg/m         Assessment & Plan:  Haley Trevino in today with chief complaint of No chief complaint on file.   1. Transition of care Hospital records reviewed  2. Dehydration Force fluids Labs pending  3. Primary hypertension Continue all current meds Continue nifedipine at 60mg  a day Rise slowly  from sitting to standing Keep check of blood pressure at home RTO as scheduled    The above assessment and management plan was discussed with the patient. The patient verbalized understanding of and has agreed to the management plan. Patient is aware to call the clinic if symptoms persist or worsen. Patient is aware when to return to the clinic for a follow-up visit. Patient educated on when it is appropriate to go to the emergency department.   Mary-Margaret Daphine Deutscher, FNP

## 2023-05-30 LAB — BMP8+EGFR
BUN/Creatinine Ratio: 11 — ABNORMAL LOW (ref 12–28)
BUN: 9 mg/dL (ref 8–27)
CO2: 18 mmol/L — ABNORMAL LOW (ref 20–29)
Calcium: 9 mg/dL (ref 8.7–10.3)
Chloride: 105 mmol/L (ref 96–106)
Creatinine, Ser: 0.85 mg/dL (ref 0.57–1.00)
Glucose: 104 mg/dL — ABNORMAL HIGH (ref 70–99)
Potassium: 4.2 mmol/L (ref 3.5–5.2)
Sodium: 143 mmol/L (ref 134–144)
eGFR: 69 mL/min/{1.73_m2} (ref >59)

## 2023-06-08 ENCOUNTER — Ambulatory Visit (INDEPENDENT_AMBULATORY_CARE_PROVIDER_SITE_OTHER): Payer: Medicare Other

## 2023-06-08 VITALS — Ht 62.0 in | Wt 106.0 lb

## 2023-06-08 DIAGNOSIS — Z0000 Encounter for general adult medical examination without abnormal findings: Secondary | ICD-10-CM

## 2023-06-08 NOTE — Progress Notes (Signed)
Subjective:   Haley Trevino is a 82 y.o. female who presents for Medicare Annual (Subsequent) preventive examination.  Visit Complete: Virtual I connected with  Haley Trevino on 06/08/23 by a audio enabled telemedicine application and verified that I am speaking with the correct person using two identifiers.  Patient Location: Home  Provider Location: Home Office  I discussed the limitations of evaluation and management by telemedicine. The patient expressed understanding and agreed to proceed.  Vital Signs: Because this visit was a virtual/telehealth visit, some criteria may be missing or patient reported. Any vitals not documented were not able to be obtained and vitals that have been documented are patient reported.  Cardiac Risk Factors include: advanced age (>71men, >45 women);dyslipidemia;hypertension;sedentary lifestyle     Objective:    Today's Vitals   06/08/23 0802  Weight: 106 lb (48.1 kg)  Height: 5\' 2"  (1.575 m)   Body mass index is 19.39 kg/m.     05/26/2023    8:11 AM 05/19/2023   11:10 PM 05/19/2023    2:31 PM 04/13/2023    2:50 PM 03/20/2023    1:30 PM 03/11/2023    8:05 AM 01/16/2023   10:07 AM  Advanced Directives  Does Patient Have a Medical Advance Directive? No  No Yes No No No  Type of Diplomatic Services operational officer;Living will    Healthcare Power of [place];Living will    Does patient want to make changes to medical advance directive? No - Patient declined        Copy of Healthcare Power of Attorney in Chart? No - copy requested    No - copy requested    Would patient like information on creating a medical advance directive? No - Patient declined No - Patient declined   No - Patient declined No - Patient declined     Current Medications (verified) Outpatient Encounter Medications as of 06/08/2023  Medication Sig   acetaminophen (TYLENOL) 500 MG tablet Take 1,000 mg by mouth at bedtime.   acidophilus (RISAQUAD) CAPS  capsule Take 2 capsules by mouth daily.   aspirin EC 81 MG tablet Take 81 mg by mouth daily. Swallow whole.   clonazePAM (KLONOPIN) 0.5 MG tablet Take 1 tablet (0.5 mg total) by mouth 2 (two) times daily as needed for anxiety.   ezetimibe (ZETIA) 10 MG tablet Take 1 tablet (10 mg total) by mouth daily.   FLUoxetine (PROZAC) 40 MG capsule Take 1 capsule (40 mg total) by mouth daily.   fluticasone (FLONASE) 50 MCG/ACT nasal spray PLACE 2 SPRAYS INTO BOTH NOSTRILS DAILY (Patient taking differently: Place 2 sprays into both nostrils as needed.)   NIFEdipine (PROCARDIA XL/NIFEDICAL XL) 60 MG 24 hr tablet Take 60 mg by mouth daily.   NONFORMULARY OR COMPOUNDED ITEM Take 1 mg by mouth at bedtime. Low dose naltrexone (Patient taking differently: Take 1 mg by mouth at bedtime. Low dose naltrexone Anti-inflammatory with strict diet to avoid inflammation)   omeprazole (PRILOSEC) 20 MG capsule TAKE 1 TABLET DAILY   PROLIA 60 MG/ML SOSY injection Inject 60 mg into the skin every 6 (six) months.   RESTASIS 0.05 % ophthalmic emulsion Place 1 drop into both eyes 2 (two) times daily.   sildenafil (REVATIO) 20 MG tablet Take 1 tablet (20 mg total) by mouth 2 (two) times daily.   SYNTHROID 75 MCG tablet TAKE (1) TABLET DAILY BE- FORE BREAKFAST.   No facility-administered encounter medications on file as of 06/08/2023.    Allergies (  verified) Bactrim [sulfamethoxazole-trimethoprim], Dilaudid [hydromorphone hcl], Eliquis [apixaban], Acyclovir and related, Crestor [rosuvastatin calcium], Gabapentin, Aleve [naproxen sodium], and Lyrica [pregabalin]   History: Past Medical History:  Diagnosis Date   Cataract    Depression    GERD (gastroesophageal reflux disease)    Hyperlipidemia    Hypertension    Hypothyroidism    IBS (irritable bowel syndrome)    Osteoarthritis    Raynaud disease    S/P TAVR (transcatheter aortic valve replacement) 05/20/2022   s/p TAVR with a 23 mm Edwards S3UR via the TF approach by  Dr. Lynnette Caffey & Bartle   Scleroderma [facility])    Thyroid disease    hypothyroidism   Past Surgical History:  Procedure Laterality Date   BREAST ENHANCEMENT SURGERY  1975   BREAST IMPLANT REMOVAL Bilateral 04/23/2016   Procedure: REMOVALBILATERAL BREAST IMPLANTS;  Surgeon: Peggye Form, DO;  Location: [facility];  Service: Plastics;  Laterality: Bilateral;   CHOLECYSTECTOMY  1973   ESOPHAGOGASTRODUODENOSCOPY N/A 10/25/2020   Procedure: ESOPHAGOGASTRODUODENOSCOPY (EGD);  Surgeon: Vida Rigger, MD;  Location: [facility];  Service: Endoscopy;  Laterality: N/A;  enteroscopy   HAND SURGERY  08/2012; 11/2012   HOT HEMOSTASIS N/A 10/25/2020   Procedure: HOT HEMOSTASIS (ARGON PLASMA COAGULATION/BICAP);  Surgeon: Vida Rigger, MD;  Location: [facility];  Service: Endoscopy;  Laterality: N/A;   INTRAOPERATIVE TRANSTHORACIC ECHOCARDIOGRAM N/A 05/13/2022   Procedure: INTRAOPERATIVE TRANSTHORACIC ECHOCARDIOGRAM;  Surgeon: Kathleene Hazel, MD;  Location: [facility];  Service: Open Heart Surgery;  Laterality: N/A;   INTRAOPERATIVE TRANSTHORACIC ECHOCARDIOGRAM N/A 05/20/2022   Procedure: INTRAOPERATIVE TRANSTHORACIC ECHOCARDIOGRAM;  Surgeon: Orbie Pyo, MD;  Location: [facility] OR;  Service: Open Heart Surgery;  Laterality: N/A;   IR RADIOLOGIST EVAL & MGMT  07/27/2017   IR SACROPLASTY BILATERAL  07/31/2017   IR VERTEBROPLASTY CERV/THOR BX INC UNI/BIL INC/INJECT/IMAGING  03/13/2020   IR VERTEBROPLASTY EA ADDL (T&LS) BX INC UNI/BIL INC INJECT/IMAGING  03/14/2020   MELANOMA EXCISION     at 31 yrs of age   ORIF HIP FRACTURE Right 02/22/2014   Procedure: OPEN REDUCTION INTERNAL FIXATION RIGHT HIP;  Surgeon: Darreld Mclean, MD;  Location: [facility];  Service: Orthopedics;  Laterality: Right;   PACEMAKER IMPLANT N/A 05/27/2022   Procedure: PACEMAKER IMPLANT;  Surgeon: Lanier Prude, MD;  Location: [facility]VASIVE CV LAB;  Service: Cardiovascular;  Laterality: N/A;   RIGHT HEART CATH N/A  01/16/2023   Procedure: RIGHT HEART CATH;  Surgeon: Dorthula Nettles, DO;  Location: [facility];  Service: Cardiovascular;  Laterality: N/A;   RIGHT HEART CATH AND CORONARY ANGIOGRAPHY N/A 03/28/2022   Procedure: RIGHT HEART CATH AND CORONARY ANGIOGRAPHY;  Surgeon: Orbie Pyo, MD;  Location: [facility];  Service: Cardiovascular;  Laterality: N/A;   TOTAL ABDOMINAL HYSTERECTOMY  1974   TRANSCATHETER AORTIC VALVE REPLACEMENT, TRANSFEMORAL Left 05/13/2022   Procedure: Transcatheter Aortic Valve Replacement, Transfemoral;  Surgeon: Kathleene Hazel, MD;  Location: [facility];  Service: Open Heart Surgery;  Laterality: Left;   TRANSCATHETER AORTIC VALVE REPLACEMENT, TRANSFEMORAL Left 05/20/2022   Procedure: Transcatheter Aortic Valve Replacement, Transfemoral using an Edwards SAPIEN 3 Ultra 23 MM Heart Valve;  Surgeon: Orbie Pyo, MD;  Location: [facility];  Service: Open Heart Surgery;  Laterality: Left;  Left Transfemoral   Family History  Problem Relation Age of Onset   Pancreatic cancer Father    Raynaud syndrome Father    Cancer Father  pancreatic   Rashes / Skin problems Daughter        possibly scleraderma   Hip fracture Mother    Mental illness Mother        attempted suicide at 57 yo   Social History   Socioeconomic History   Marital status: Married    Spouse name: Not on file   Number of children: 2   Years of education: Not on file   Highest education level: Some college, no degree  Occupational History   Occupation: retired Holiday representative at [employer]  Tobacco Use   Smoking status: Never   Smokeless tobacco: Never   Tobacco comments:    passive tobacco smoke exposure as child  Vaping Use   Vaping status: Never Used  Substance and Sexual Activity   Alcohol use: No   Drug use: No   Sexual activity: Not Currently    Birth control/protection: Surgical  Other Topics Concern   Not on file  Social History Narrative   Regular  exercise: walkingCaffeine use: 1 cup of coffee daily   Lives in split level home with her husband   Social Determinants of Health   Financial Resource Strain: Low Risk  (06/08/2023)   Overall Financial Resource Strain (CARDIA)    Difficulty of Paying Living Expenses: Not hard at all  Food Insecurity: No Food Insecurity (06/08/2023)   Hunger Vital Sign    Worried About Running Out of Food in the Last Year: Never true    Ran Out of Food in the Last Year: Never true  Transportation Needs: No Transportation Needs (06/08/2023)   PRAPARE - Administrator, Civil Service (Medical): No    Lack of Transportation (Non-Medical): No  Physical Activity: Inactive (06/08/2023)   Exercise Vital Sign    Days of Exercise per Week: 0 days    Minutes of Exercise per Session: 0 min  Stress: No Stress Concern Present (06/08/2023)   [employer] of Occupational Health - Occupational Stress Questionnaire    Feeling of Stress : Not at all  Social Connections: Moderately Integrated (06/08/2023)   Social Connection and Isolation Panel [NHANES]    Frequency of Communication with Friends and Family: More than three times a week    Frequency of Social Gatherings with Friends and Family: Three times a week    Attends Religious Services: More than 4 times per year    Active Member of Clubs or Organizations: No    Attends Banker Meetings: Never    Marital Status: Married    Tobacco Counseling Counseling given: Not Answered Tobacco comments: passive tobacco smoke exposure as child   Clinical Intake:  Pre-visit preparation completed: Yes  Pain : No/denies pain     Diabetes: No  How often do you need to have someone help you when you read instructions, pamphlets, or other written materials from your doctor or pharmacy?: 1 - Never  Interpreter Needed?: No  Information entered by :: Kandis Fantasia LPN   Activities of Daily Living    06/08/2023    8:09 AM 05/19/2023   11:00  PM  In your present state of health, do you have any difficulty performing the following activities:  Hearing? 0 1  Vision? 0 1  Difficulty concentrating or making decisions? 0 1  Walking or climbing stairs? 1   Dressing or bathing? 0   Doing errands, shopping? 1 0  Preparing Food and eating ? N   Using the Toilet? N   In the  past six months, have you accidently leaked urine? N   Do you have problems with loss of bowel control? N   Managing your Medications? N   Managing your Finances? N   Housekeeping or managing your Housekeeping? N     Patient Care Team: Bennie Pierini, FNP as PCP - General (Nurse Practitioner) Rollene Rotunda, MD as PCP - Cardiology (Cardiology) Lanier Prude, MD as PCP - Electrophysiology (Cardiology) Vida Rigger, MD as Consulting Physician (Gastroenterology) Carlus Pavlov, MD as Consulting Physician (Internal Medicine) Donnetta Hail, MD as Consulting Physician (Rheumatology) Stephanie Acre, MD as Referring Physician (Orthopedic Surgery) Durenda Age, MD as Referring Physician (Internal Medicine) Francena Hanly, MD as Consulting Physician (Orthopedic Surgery) Darreld Mclean, MD as Consulting Physician (Orthopedic Surgery) Barrett, Shawn Stall as Physician Assistant (Cardiology) Darolyn Rua as Physician Assistant (Oncology)  Indicate any recent Medical Services you may have received from other than [facility] providers in the past year (date may be approximate).     Assessment:   This is a routine wellness examination for Haley Trevino.  Hearing/Vision screen Hearing Screening - Comments:: Denies hearing difficulties   Vision Screening - Comments:: Wears rx glasses - up to date with routine eye exams with Dr. Manning Charity      Goals Addressed   None   Depression Screen    06/08/2023    8:08 AM 05/29/2023   11:38 AM 03/30/2023   10:34 AM 02/09/2023    2:04 PM 08/18/2022    1:53 PM 06/02/2022    2:56 PM 06/02/2022    2:55 PM   PHQ 2/9 Scores  PHQ - 2 Score 0 0 4 0 0 0 0  PHQ- 9 Score 0 0 10 0 0      Fall Risk    06/08/2023    8:09 AM 05/29/2023   11:37 AM 03/30/2023   10:34 AM 02/09/2023    2:04 PM 08/18/2022    1:53 PM  Fall Risk   Falls in the past year? 1 1 0 0 0  Number falls in past yr: 0 0 0    Injury with Fall? 1 1 0    Risk for fall due to : History of fall(s);Impaired balance/gait;Impaired mobility History of fall(s)     Follow up Education provided;Falls prevention discussed;Falls evaluation completed Education provided       MEDICARE RISK AT HOME: Medicare Risk at Home Any stairs in or around the home?: No If so, are there any without handrails?: No Home free of loose throw rugs in walkways, pet beds, electrical cords, etc?: Yes Adequate lighting in your home to reduce risk of falls?: Yes Life alert?: No Use of a cane, walker or w/c?: Yes Grab bars in the bathroom?: Yes Shower chair or bench in shower?: Yes Elevated toilet seat or a handicapped toilet?: Yes  TIMED UP AND GO:  Was the test performed?  No    Cognitive Function:    04/20/2018    4:21 PM 03/26/2017    3:30 PM 12/08/2014    2:42 PM  MMSE - Mini Mental State Exam  Orientation to time 5 5 5   Orientation to Place 5 5 5   Registration 3 3 3   Attention/ Calculation 5 5 5   Recall 1 0 3  Language- name 2 objects 2 2 2   Language- repeat 1 1 1   Language- follow 3 step command 3 3 3   Language- read & follow direction 1 1 1   Write a sentence 1 1 1  Copy design 1 0 1  Total score 28 26 30         06/08/2023    8:09 AM 06/02/2022    3:00 PM 04/26/2020    2:33 PM 04/26/2019    2:56 PM  6CIT Screen  What Year? 0 points 0 points 0 points 0 points  What month? 0 points 0 points 0 points 0 points  What time? 0 points 0 points 0 points 0 points  Count back from 20 0 points 0 points 0 points 0 points  Months in reverse 0 points 0 points 0 points 0 points  Repeat phrase 0 points 0 points 0 points 0 points  Total Score 0 points  0 points 0 points 0 points    Immunizations Immunization History  Administered Date(s) Administered   Fluad Quad(high Dose 65+) 04/14/2019, 05/15/2020, 04/19/2021, 05/05/2022   Fluad Trivalent(High Dose 65+) 05/29/2023   Influenza, High Dose Seasonal PF 04/08/2018   Influenza,inj,Quad PF,6+ Mos 04/06/2013, 04/19/2014, 04/19/2015, 03/25/2016, 04/02/2017   Influenza-Unspecified 04/19/2014   Moderna Sars-Covid-2 Vaccination 08/18/2019, 09/16/2019, 05/23/2020   Pneumococcal Conjugate-13 06/02/2014   Pneumococcal Polysaccharide-23 06/25/2011, 04/06/2014   Tdap 07/07/2005, 04/17/2006, 03/26/2017   Zoster Recombinant(Shingrix) 04/20/2018, 06/23/2018   Zoster, Live 08/06/2009    TDAP status: Up to date  Flu Vaccine status: Up to date  Pneumococcal vaccine status: Up to date  Covid-19 vaccine status: Information provided on how to obtain vaccines.   Qualifies for Shingles Vaccine? Yes   Zostavax completed Yes   Shingrix Completed?: Yes  Screening Tests Health Maintenance  Topic Date Due   Colonoscopy  06/23/2018   COVID-19 Vaccine (4 - 2023-24 season) 03/08/2023   Medicare Annual Wellness (AWV)  06/07/2024   DEXA SCAN  02/10/2025   DTaP/Tdap/Td (4 - Td or Tdap) 03/27/2027   Pneumonia Vaccine 25+ Years old  Completed   INFLUENZA VACCINE  Completed   Zoster Vaccines- Shingrix  Completed   HPV VACCINES  Aged Out    Health Maintenance  Health Maintenance Due  Topic Date Due   Colonoscopy  06/23/2018   COVID-19 Vaccine (4 - 2023-24 season) 03/08/2023    Colorectal cancer screening: No longer required.   Mammogram status: No longer required due to age and preference.  Bone Density status: Completed 02/11/23. Results reflect: Bone density results: OSTEOPOROSIS. Repeat every 2 years.  Lung Cancer Screening: (Low Dose CT Chest recommended if Age 16-80 years, 20 pack-year currently smoking OR have quit w/in 15years.) does not qualify.   Lung Cancer Screening Referral:  n/a  Additional Screening:  Hepatitis C Screening: does not qualify  Vision Screening: Recommended annual ophthalmology exams for early detection of glaucoma and other disorders of the eye. Is the patient up to date with their annual eye exam?  Yes  Who is the provider or what is the name of the office in which the patient attends annual eye exams? Dr. Manning Charity  If pt is not established with a provider, would they like to be referred to a provider to establish care? No .   Dental Screening: Recommended annual dental exams for proper oral hygiene  Community Resource Referral / Chronic Care Management: CRR required this visit?  No   CCM required this visit?  No     Plan:     I have personally reviewed and noted the following in the patient's chart:   Medical and social history Use of alcohol, tobacco or illicit drugs  Current medications and supplements including opioid prescriptions. Patient is not  currently taking opioid prescriptions. Functional ability and status Nutritional status Physical activity Advanced directives List of other physicians Hospitalizations, surgeries, and ER visits in previous 12 months Vitals Screenings to include cognitive, depression, and falls Referrals and appointments  In addition, I have reviewed and discussed with patient certain preventive protocols, quality metrics, and best practice recommendations. A written personalized care plan for preventive services as well as general preventive health recommendations were provided to patient.     Kandis Fantasia [place], [place]   16/07/958   After Visit Summary: (MyChart) Due to this being a telephonic visit, the after visit summary with patients personalized plan was offered to patient via MyChart   Nurse Notes: No concerns at this time

## 2023-06-08 NOTE — Patient Instructions (Signed)
Haley Trevino , Thank you for taking time to come for your Medicare Wellness Visit. I appreciate your ongoing commitment to your health goals. Please review the following plan we discussed and let me know if I can assist you in the future.   Referrals/Orders/Follow-Ups/Clinician Recommendations: Aim for 30 minutes of exercise or brisk walking, 6-8 glasses of water, and 5 servings of fruits and vegetables each day.  This is a list of the screening recommended for you and due dates:  Health Maintenance  Topic Date Due   Colon Cancer Screening  06/23/2018   COVID-19 Vaccine (4 - 2023-24 season) 03/08/2023   Medicare Annual Wellness Visit  06/07/2024   DEXA scan (bone density measurement)  02/10/2025   DTaP/Tdap/Td vaccine (4 - Td or Tdap) 03/27/2027   Pneumonia Vaccine  Completed   Flu Shot  Completed   Zoster (Shingles) Vaccine  Completed   HPV Vaccine  Aged Out    Advanced directives: (ACP Link)Information on Advanced Care Planning can be found at [facility] Advance Health Care Directives Advance Health Care Directives ([email or web address]   Next Medicare Annual Wellness Visit scheduled for next year: Yes

## 2023-06-26 ENCOUNTER — Ambulatory Visit (INDEPENDENT_AMBULATORY_CARE_PROVIDER_SITE_OTHER): Payer: Medicare Other

## 2023-06-26 DIAGNOSIS — I442 Atrioventricular block, complete: Secondary | ICD-10-CM | POA: Diagnosis not present

## 2023-06-26 LAB — CUP PACEART REMOTE DEVICE CHECK
Battery Remaining Longevity: 118 mo
Battery Remaining Percentage: 92 %
Battery Voltage: 3.01 V
Brady Statistic AP VP Percent: 1 %
Brady Statistic AP VS Percent: 1.3 %
Brady Statistic AS VP Percent: 1 %
Brady Statistic AS VS Percent: 98 %
Brady Statistic RA Percent Paced: 1.3 %
Brady Statistic RV Percent Paced: 1 %
Date Time Interrogation Session: 20241220040016
Implantable Lead Connection Status: 753985
Implantable Lead Connection Status: 753985
Implantable Lead Implant Date: 20231121
Implantable Lead Implant Date: 20231121
Implantable Lead Location: 753859
Implantable Lead Location: 753860
Implantable Pulse Generator Implant Date: 20231121
Lead Channel Impedance Value: 380 Ohm
Lead Channel Impedance Value: 410 Ohm
Lead Channel Pacing Threshold Amplitude: 0.625 V
Lead Channel Pacing Threshold Amplitude: 1.125 V
Lead Channel Pacing Threshold Pulse Width: 0.4 ms
Lead Channel Pacing Threshold Pulse Width: 0.5 ms
Lead Channel Sensing Intrinsic Amplitude: 0.9 mV
Lead Channel Sensing Intrinsic Amplitude: 4.6 mV
Lead Channel Setting Pacing Amplitude: 1.375
Lead Channel Setting Pacing Amplitude: 1.625
Lead Channel Setting Pacing Pulse Width: 0.5 ms
Lead Channel Setting Sensing Sensitivity: 2 mV
Pulse Gen Model: 2272
Pulse Gen Serial Number: 6860174

## 2023-06-29 ENCOUNTER — Encounter
Payer: Medicare Other | Attending: Physical Medicine and Rehabilitation | Admitting: Physical Medicine and Rehabilitation

## 2023-06-29 VITALS — BP 115/65 | HR 82 | Ht 62.0 in | Wt 104.8 lb

## 2023-06-29 DIAGNOSIS — Z950 Presence of cardiac pacemaker: Secondary | ICD-10-CM | POA: Insufficient documentation

## 2023-06-29 DIAGNOSIS — W19XXXA Unspecified fall, initial encounter: Secondary | ICD-10-CM | POA: Diagnosis not present

## 2023-06-29 DIAGNOSIS — R296 Repeated falls: Secondary | ICD-10-CM | POA: Insufficient documentation

## 2023-06-29 DIAGNOSIS — G894 Chronic pain syndrome: Secondary | ICD-10-CM

## 2023-06-29 DIAGNOSIS — Z7182 Exercise counseling: Secondary | ICD-10-CM | POA: Insufficient documentation

## 2023-06-29 DIAGNOSIS — I7300 Raynaud's syndrome without gangrene: Secondary | ICD-10-CM | POA: Insufficient documentation

## 2023-06-29 DIAGNOSIS — R251 Tremor, unspecified: Secondary | ICD-10-CM | POA: Diagnosis not present

## 2023-06-29 DIAGNOSIS — K589 Irritable bowel syndrome without diarrhea: Secondary | ICD-10-CM | POA: Diagnosis not present

## 2023-06-29 DIAGNOSIS — M797 Fibromyalgia: Secondary | ICD-10-CM | POA: Diagnosis not present

## 2023-06-29 DIAGNOSIS — S46012A Strain of muscle(s) and tendon(s) of the rotator cuff of left shoulder, initial encounter: Secondary | ICD-10-CM | POA: Insufficient documentation

## 2023-06-29 DIAGNOSIS — G8929 Other chronic pain: Secondary | ICD-10-CM | POA: Insufficient documentation

## 2023-06-29 DIAGNOSIS — S46011A Strain of muscle(s) and tendon(s) of the rotator cuff of right shoulder, initial encounter: Secondary | ICD-10-CM | POA: Insufficient documentation

## 2023-06-29 DIAGNOSIS — F419 Anxiety disorder, unspecified: Secondary | ICD-10-CM | POA: Insufficient documentation

## 2023-06-29 DIAGNOSIS — M349 Systemic sclerosis, unspecified: Secondary | ICD-10-CM | POA: Diagnosis not present

## 2023-06-29 DIAGNOSIS — R531 Weakness: Secondary | ICD-10-CM | POA: Insufficient documentation

## 2023-06-29 NOTE — Progress Notes (Signed)
Subjective:    Patient ID: Haley Trevino, female    DOB: August 03, 1940, 82 y.o.   MRN: [id number]  HPI Haley Trevino is an 82 year old woman who presents to establish care for chronic pain.  1) Chronic pain: -she has pain everywhere -she was diagnosed with scleroderma and fibromyalgia -not doing well  2) Left rotator cuff tear -was going to have surgery but it had do be postponed due to need for pacemaker placement  3) IBS: -symptoms have improved -avoids fried foods -has not missed chocolate or cookies -she tasted a lot of foods at Thanksgiving, including dressing -bread is her problem- she has to have it.   4) s/p multiple falls: -continues to fall -she was found to be dehydrated -she was given fluids -she does not drink enough -they scanned her carotid arteries   Pain Inventory Average Pain 4 Pain Right Now 4 My pain is intermittent, constant, and sharp  In the last 24 hours, has pain interfered with the following? General activity 4 Relation with others 4 Enjoyment of life 4 What TIME of day is your pain at its worst? morning , daytime, evening, night, and varies Sleep (in general) Fair  Pain is worse with: bending, sitting, and some activites Pain improves with: rest, heat/ice, and medication Relief from Meds: 5    Family History  Problem Relation Age of Onset   Pancreatic cancer Father    Raynaud syndrome Father    Cancer Father        pancreatic   Rashes / Skin problems Daughter        possibly scleraderma   Hip fracture Mother    Mental illness Mother        attempted suicide at 18 yo   Social History   Socioeconomic History   Marital status: Married    Spouse name: Not on file   Number of children: 2   Years of education: Not on file   Highest education level: Some college, no degree  Occupational History   Occupation: retired Holiday representative at [employer]  Tobacco Use   Smoking status: Never   Smokeless tobacco: Never   Tobacco  comments:    passive tobacco smoke exposure as child  Vaping Use   Vaping status: Never Used  Substance and Sexual Activity   Alcohol use: No   Drug use: No   Sexual activity: Not Currently    Birth control/protection: Surgical  Other Topics Concern   Not on file  Social History Narrative   Regular exercise: walkingCaffeine use: 1 cup of coffee daily   Lives in split level home with her husband   Social Drivers of Corporate investment banker Strain: Low Risk  (06/08/2023)   Overall Financial Resource Strain (CARDIA)    Difficulty of Paying Living Expenses: Not hard at all  Food Insecurity: No Food Insecurity (06/08/2023)   Hunger Vital Sign    Worried About Running Out of Food in the Last Year: Never true    Ran Out of Food in the Last Year: Never true  Transportation Needs: No Transportation Needs (06/08/2023)   PRAPARE - Administrator, Civil Service (Medical): No    Lack of Transportation (Non-Medical): No  Physical Activity: Inactive (06/08/2023)   Exercise Vital Sign    Days of Exercise per Week: 0 days    Minutes of Exercise per Session: 0 min  Stress: No Stress Concern Present (06/08/2023)   [employer] of Occupational Health -  Occupational Stress Questionnaire    Feeling of Stress : Not at all  Social Connections: Moderately Integrated (06/08/2023)   Social Connection and Isolation Panel [NHANES]    Frequency of Communication with Friends and Family: More than three times a week    Frequency of Social Gatherings with Friends and Family: Three times a week    Attends Religious Services: More than 4 times per year    Active Member of Clubs or Organizations: No    Attends Banker Meetings: Never    Marital Status: Married   Past Surgical History:  Procedure Laterality Date   BREAST ENHANCEMENT SURGERY  1975   BREAST IMPLANT REMOVAL Bilateral 04/23/2016   Procedure: REMOVALBILATERAL BREAST IMPLANTS;  Surgeon: Peggye Form, DO;   Location: [facility];  Service: Plastics;  Laterality: Bilateral;   CHOLECYSTECTOMY  1973   ESOPHAGOGASTRODUODENOSCOPY N/A 10/25/2020   Procedure: ESOPHAGOGASTRODUODENOSCOPY (EGD);  Surgeon: Vida Rigger, MD;  Location: [facility];  Service: Endoscopy;  Laterality: N/A;  enteroscopy   HAND SURGERY  08/2012; 11/2012   HOT HEMOSTASIS N/A 10/25/2020   Procedure: HOT HEMOSTASIS (ARGON PLASMA COAGULATION/BICAP);  Surgeon: Vida Rigger, MD;  Location: [facility];  Service: Endoscopy;  Laterality: N/A;   INTRAOPERATIVE TRANSTHORACIC ECHOCARDIOGRAM N/A 05/13/2022   Procedure: INTRAOPERATIVE TRANSTHORACIC ECHOCARDIOGRAM;  Surgeon: Kathleene Hazel, MD;  [facility]: [facility];  Service: Open Heart Surgery;  Laterality: N/A;   INTRAOPERATIVE TRANSTHORACIC ECHOCARDIOGRAM N/A 05/20/2022   Procedure: INTRAOPERATIVE TRANSTHORACIC ECHOCARDIOGRAM;  Surgeon: Orbie Pyo, MD;  Location[facility];  Service: Open Heart Surgery;  Laterality: N/A;   IR RADIOLOGIST EVAL & MGMT  07/27/2017   IR SACROPLASTY BILATERAL  07/31/2017   IR VERTEBROPLASTY CERV/THOR BX INC UNI/BIL INC/INJECT/IMAGING  03/13/2020   IR VERTEBROPLASTY EA ADDL (T&LS) BX INC UNI/BIL INC INJECT/IMAGING  03/14/2020   MELANOMA EXCISION     at 31 yrs of age   ORIF HIP FRACTURE Right 02/22/2014   Procedure: OPEN REDUCTION INTERNAL FIXATION RIGHT HIP;  Surgeon: Darreld Mclean, MD;  Location: [facility];  Service: Orthopedics;  Laterality: Right;   PACEMAKER IMPLANT N/A 05/27/2022   Procedure: PACEMAKER IMPLANT;  Surgeon: Lanier Prude, MD;  Location: [facility];  Service: Cardiovascular;  Laterality: N/A;   RIGHT HEART CATH N/A 01/16/2023   Procedure: RIGHT HEART CATH;  Surgeon: Dorthula Nettles, DO;  [facility]: [facility];  Service: Cardiovascular;  Laterality: N/A;   RIGHT HEART CATH AND CORONARY ANGIOGRAPHY N/A 03/28/2022   Procedure: RIGHT HEART CATH AND CORONARY ANGIOGRAPHY;  Surgeon: Orbie Pyo, MD;   [facility];  Service: Cardiovascular;  Laterality: N/A;   TOTAL ABDOMINAL HYSTERECTOMY  1974   TRANSCATHETER AORTIC VALVE REPLACEMENT, TRANSFEMORAL Left 05/13/2022   Procedure: Transcatheter Aortic Valve Replacement, Transfemoral;  Surgeon: Kathleene Hazel, MD;  [facility];  Service: Open Heart Surgery;  Laterality: Left;   TRANSCATHETER AORTIC VALVE REPLACEMENT, TRANSFEMORAL Left 05/20/2022   Procedure: Transcatheter Aortic Valve Replacement, Transfemoral using an Edwards SAPIEN 3 Ultra 23 MM Heart Valve;  Surgeon: Orbie Pyo, MD;  Location: [facility];  Service: Open Heart Surgery;  Laterality: Left;  Left Transfemoral   Past Surgical History:  Procedure Laterality Date   BREAST ENHANCEMENT SURGERY  1975   BREAST IMPLANT REMOVAL Bilateral 04/23/2016   Procedure: REMOVALBILATERAL BREAST IMPLANTS;  Surgeon: Peggye Form, DO;  Location: [facility];  Service: Plastics;  Laterality: Bilateral;   CHOLECYSTECTOMY  1973   ESOPHAGOGASTRODUODENOSCOPY N/A 10/25/2020   Procedure: ESOPHAGOGASTRODUODENOSCOPY (EGD);  Surgeon: Vida Rigger, MD;  Location: [facility];  Service: Endoscopy;  Laterality: N/A;  enteroscopy   HAND SURGERY  08/2012; 11/2012   HOT HEMOSTASIS N/A 10/25/2020   Procedure: HOT HEMOSTASIS (ARGON PLASMA COAGULATION/BICAP);  Surgeon: Vida Rigger, MD;  Location: [facility];  Service: Endoscopy;  Laterality: N/A;   INTRAOPERATIVE TRANSTHORACIC ECHOCARDIOGRAM N/A 05/13/2022   Procedure: INTRAOPERATIVE TRANSTHORACIC ECHOCARDIOGRAM;  Surgeon: Kathleene Hazel, MD;  [facility];  Service: Open Heart Surgery;  Laterality: N/A;   INTRAOPERATIVE TRANSTHORACIC ECHOCARDIOGRAM N/A 05/20/2022   Procedure: INTRAOPERATIVE TRANSTHORACIC ECHOCARDIOGRAM;  Surgeon: Orbie Pyo, MD;  Location: [facility];  Service: Open Heart Surgery;  Laterality: N/A;   IR RADIOLOGIST EVAL & MGMT  07/27/2017   IR SACROPLASTY BILATERAL   07/31/2017   IR VERTEBROPLASTY CERV/THOR BX INC UNI/BIL INC/INJECT/IMAGING  03/13/2020   IR VERTEBROPLASTY EA ADDL (T&LS) BX INC UNI/BIL INC INJECT/IMAGING  03/14/2020   MELANOMA EXCISION     at 31 yrs of age   ORIF HIP FRACTURE Right 02/22/2014   Procedure: OPEN REDUCTION INTERNAL FIXATION RIGHT HIP;  Surgeon: Darreld Mclean, MD;  Location: [facility];  Service: Orthopedics;  Laterality: Right;   PACEMAKER IMPLANT N/A 05/27/2022   Procedure: PACEMAKER IMPLANT;  Surgeon: Lanier Prude, MD;  Location: [facility] INVASIVE CV LAB;  Service: Cardiovascular;  Laterality: N/A;   RIGHT HEART CATH N/A 01/16/2023   Procedure: RIGHT HEART CATH;  Surgeon: Dorthula Nettles, DO;  [facility];  Service: Cardiovascular;  Laterality: N/A;   RIGHT HEART CATH AND CORONARY ANGIOGRAPHY N/A 03/28/2022   Procedure: RIGHT HEART CATH AND CORONARY ANGIOGRAPHY;  Surgeon: Orbie Pyo, MD;  [facility];  Service: Cardiovascular;  Laterality: N/A;   TOTAL ABDOMINAL HYSTERECTOMY  1974   TRANSCATHETER AORTIC VALVE REPLACEMENT, TRANSFEMORAL Left 05/13/2022   Procedure: Transcatheter Aortic Valve Replacement, Transfemoral;  Surgeon: Kathleene Hazel, MD;  [facility];  Service: Open Heart Surgery;  Laterality: Left;   TRANSCATHETER AORTIC VALVE REPLACEMENT, TRANSFEMORAL Left 05/20/2022   Procedure: Transcatheter Aortic Valve Replacement, Transfemoral using an Edwards SAPIEN 3 Ultra 23 MM Heart Valve;  Surgeon: Orbie Pyo, MD;  [facility];  Service: Open Heart Surgery;  Laterality: Left;  Left Transfemoral   Past Medical History:  Diagnosis Date   Cataract    Depression    GERD (gastroesophageal reflux disease)    Hyperlipidemia    Hypertension    Hypothyroidism    IBS (irritable bowel syndrome)    Osteoarthritis    Raynaud disease    S/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  Lynnette Caffey & Bartle   Scleroderma [facility])    Thyroid disease    hypothyroidism   There were no vitals taken for this visit.  Opioid Risk Score:   Fall Risk Score:  `1  Depression screen [facility] 2/9     06/08/2023    8:08 AM 05/29/2023   11:38 AM 03/30/2023   10:34 AM 02/09/2023    2:04 PM 08/18/2022    1:53 PM 06/02/2022    2:56 PM 06/02/2022    2:55 PM  Depression screen PHQ 2/9  Decreased Interest 0 0 2 0 0 0 0  Down, Depressed, Hopeless 0 0 2 0 0 0 0  PHQ - 2 Score 0 0 4 0  0 0 0  Altered sleeping 0 0 2 0 0    Tired, decreased energy 0 0 2 0 0    Change in appetite 0 0 2 0 0    Feeling bad or failure about yourself  0 0 0 0 0    Trouble concentrating 0 0 0 0 0    Moving slowly or fidgety/restless 0 0 0 0 0    Suicidal thoughts 0 0 0 0 0    PHQ-9 Score 0 0 10 0 0    Difficult doing work/chores Not difficult at all Not difficult at all  Not difficult at all Not difficult at all      Review of Systems  Constitutional:  Positive for appetite change.       Poor appetite  Respiratory:  Positive for cough and shortness of breath.   Gastrointestinal:  Positive for nausea.  Neurological:  Positive for dizziness, tremors and weakness.  Psychiatric/Behavioral:         Depression, Anxiety  All other systems reviewed and are negative.      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  -Discussed benefits of exercise in reducing pain. -discussed that exercise can be helpful for fibromyalgia -discussed she does not like pain medicine  but sometimes she needs it -referred to PT, discussed that she loved going there but the PT is too costly -recommended exercise bike at home.   -discussed mechanism of action of low dose naltrexone as an opioid receptor antagonist which stimulates your body's production of its own natural endogenous opioids, helping to decrease pain. Discussed that it can also decrease T cell response and thus be helpful in decreasing inflammation, and symptoms of brain fog, fatigue, anxiety, depression, and allergies. Discussed that this medication needs to be compounded at a compounding pharmacy and can more expensive. Discussed that I usually start at 1mg  and if this is not providing enough relief then I titrate upward on a monthly basis.    -food sensitivity testing ordered -Discussed current symptoms of pain and history of pain.  -Discussed benefits of exercise in reducing pain. -Discussed following foods that may reduce pain: 1) Ginger (especially studied for arthritis)- reduce leukotriene production to decrease inflammation 2) Blueberries- high in phytonutrients that decrease inflammation 3) Salmon- marine omega-3s reduce joint swelling and pain 4) Pumpkin seeds- reduce inflammation 5) dark chocolate- reduces inflammation 6) turmeric- reduces inflammation 7) tart cherries - reduce pain and stiffness 8) extra virgin olive oil - its compound olecanthal helps to block prostaglandins  9) chili peppers- can be eaten or applied topically via capsaicin 10) mint- helpful for headache, muscle aches, joint pain, and itching 11) garlic- reduces inflammation  Link to further information on diet for chronic pain: http://www.bray.com/   Benefits of turmeric:  -Highly anti-inflammatory  -Increases antioxidants  -Improves memory, attention, brain disease  -Lowers risk of heart disease  -May help prevent cancer  -Decreases  pain  -Alleviates depression  -Delays aging and decreases risk of chronic disease  -Consume with black pepper to increase absorption  Turmeric Milk Recipe:  1 cup milk  1 tsp turmeric  1 tsp cinnamon  1 tsp grated ginger (optional)  Black pepper (boosts the anti-inflammatory properties of turmeric).  1 tsp honey    2) Right rotator cuff tear: -discussed her plan for surgery.  -discussed that she does not want to try physical therapy since it hurts so much  3) Bilateral leg weakness -referred for PT  4) pacemaker: -discussed her recent pacemaker placement  5) Food sensitivity/IBS: -discussed her food sensitivities and recommended avoiding wheat, dairy, and corn, discussed that she likes mac and cheese -discussed her history of IBS -discussed that she loves bread and it is hard for her to start this -discussed that she likes sourdough -discussed that she finds gluten free bread disgusting -reviewed her dietary preferences and discussed good and bad choices  6) Fall: -discussed that is usually 2/2 to dehydration -reviewed current medications  7) Tremor: -discussed that she has not seen a neurologist  8) Anxiety: -continue klonopin 0.5mg  BID prn, discussed that if she wants to wean off she can break the morning dose in hald -discussed that she has been through behavioral therapy for a long time -Discussed exercise and meditation as tools to decrease anxiety. -Recommended Down Dog Yoga app -Discussed spending time outdoors. -Discussed positive re-framing of anxiety.  -Discussed the following foods that have been show to reduce anxiety: 1) [place] nuts, mushrooms, soy beans due to their high selenium content. Upper limit of toxicity of selenium is 431mcg/day so no more than 3-4 [place] nuts per day.  2) Fatty fish such as salmon, mackerel, sardines, trout, and herring- high in omega-3 fatty acids 3) Eggs- increases serotonin and dopamine 4) Pumpkin seeds- high  in omega-3 fatty acids 5) dark chocolate- high in flavanols that increase blood flow to brain 6) turmeric- take with black pepper to increase absorption 7) chamomile tea- antioxidant and anti-inflammatory properties 8) yogurt without sugar- supports gut-brain axis 9) green tea- contains L- theanine 10) blueberries- high in vitamin C and antioxidants 11) [place]- high in tryptophan which gets converted to serotonin 12) bell peppers- rich in vitamin C and antioxidants 13) citrus fruits- rich in vitamin C and antioxidants 14) almonds- high in vitamin E and healthy fats 15) chia seeds- high in omega-3 fatty acids  45 minutes spent in discussing that she is work out by her husband's agitation, her pain, that she got dehydrated and had another fall, her fear of falling, her anxiety, reviewed her current medications, discussed her desire to decreased some of there medications, discussed that she has Raynaud's syndrome and her nifedipine was increased to 60mg , discussed that her hip bothers her all the time and she wakes up with her shoulder hurting, discussed that her uncle will be 100 in April, discussed that she two great grandchildren and has a lot fo be thankful for her, discussed that physical therapy was helpful but it cost her almost $400 for the month of therapy, discussed her tremor, her son's suicide, her dietary changes, her mother's suicide, discussed that she has a lot of anxiety, chart review, reviewed her medications

## 2023-06-29 NOTE — Patient Instructions (Addendum)
1) Drink water instead of pedialyte 2) Eat sourdough bread when wanting bread   -Discussed current symptoms of pain and history of pain.  -Discussed benefits of exercise in reducing pain. -Discussed following foods that may reduce pain: 1) Ginger (especially studied for arthritis)- reduce leukotriene production to decrease inflammation 2) Blueberries- high in phytonutrients that decrease inflammation 3) Salmon- marine omega-3s reduce joint swelling and pain 4) Pumpkin seeds- reduce inflammation 5) dark chocolate- reduces inflammation 6) turmeric- reduces inflammation 7) tart cherries - reduce pain and stiffness 8) extra virgin olive oil - its compound olecanthal helps to block prostaglandins  9) chili peppers- can be eaten or applied topically via capsaicin 10) mint- helpful for headache, muscle aches, joint pain, and itching 11) garlic- reduces inflammation  Link to further information on diet for chronic pain: http://www.bray.com/   Anxiety: -Discussed exercise and meditation as tools to decrease anxiety. -Recommended Down Dog Yoga app -Discussed spending time outdoors. -Discussed positive re-framing of anxiety.  -Discussed the following foods that have been show to reduce anxiety: 1) [place] nuts, mushrooms, soy beans due to their high selenium content. Upper limit of toxicity of selenium is 432mcg/day so no more than 3-4 [place] nuts per day.  2) Fatty fish such as salmon, mackerel, sardines, trout, and herring- high in omega-3 fatty acids 3) Eggs- increases serotonin and dopamine 4) Pumpkin seeds- high in omega-3 fatty acids 5) dark chocolate- high in flavanols that increase blood flow to brain 6) turmeric- take with black pepper to increase absorption 7) chamomile tea- antioxidant and anti-inflammatory properties 8) yogurt without sugar- supports gut-brain axis 9) green tea- contains L- theanine 10)  blueberries- high in vitamin C and antioxidants 11) [place]- high in tryptophan which gets converted to serotonin 12) bell peppers- rich in vitamin C and antioxidants 13) citrus fruits- rich in vitamin C and antioxidants 14) almonds- high in vitamin E and healthy fats 15) chia seeds- high in omega-3 fatty acids -Made goal to ____

## 2023-07-20 ENCOUNTER — Ambulatory Visit (INDEPENDENT_AMBULATORY_CARE_PROVIDER_SITE_OTHER): Payer: Medicare Other | Admitting: Family Medicine

## 2023-07-20 ENCOUNTER — Encounter: Payer: Self-pay | Admitting: Family Medicine

## 2023-07-20 VITALS — BP 109/56 | HR 75 | Temp 98.3°F | Ht 62.0 in | Wt 104.0 lb

## 2023-07-20 DIAGNOSIS — H6123 Impacted cerumen, bilateral: Secondary | ICD-10-CM | POA: Diagnosis not present

## 2023-07-20 DIAGNOSIS — R42 Dizziness and giddiness: Secondary | ICD-10-CM

## 2023-07-20 DIAGNOSIS — R39198 Other difficulties with micturition: Secondary | ICD-10-CM | POA: Diagnosis not present

## 2023-07-20 LAB — URINALYSIS, ROUTINE W REFLEX MICROSCOPIC
Bilirubin, UA: NEGATIVE
Glucose, UA: NEGATIVE
Leukocytes,UA: NEGATIVE
Nitrite, UA: NEGATIVE
RBC, UA: NEGATIVE
Specific Gravity, UA: 1.015 (ref 1.005–1.030)
Urobilinogen, Ur: 0.2 mg/dL (ref 0.2–1.0)
pH, UA: 6 (ref 5.0–7.5)

## 2023-07-20 LAB — MICROSCOPIC EXAMINATION
Bacteria, UA: NONE SEEN
Epithelial Cells (non renal): NONE SEEN /[HPF] (ref 0–10)
RBC, Urine: NONE SEEN /[HPF] (ref 0–2)
Renal Epithel, UA: NONE SEEN /[HPF]
Yeast, UA: NONE SEEN

## 2023-07-20 NOTE — Progress Notes (Signed)
 Subjective: CC: Ear swelling, possible UTI PCP: Gladis Mustard, FNP Haley Trevino is a 83 y.o. female presenting to clinic today for:  1.  Ear swelling Patient reports that she has had about a 1 week history of a right sided ear swelling.  She denies any preceding injury.  She often gets quite a bit of cerumen impaction bilaterally and sees an ENT to have this cleared out.  Her last cleaning was over 6 months ago.  She also has history of decreased hearing bilaterally right greater than left and is expected to get some hearing aids soon.  She notes that she has been having some dizziness that is been intermittent but more prevalent over the last week.  This seems to have onset when she had a pacemaker placed last year.  She lost a lot of weight and has just been declining since.  2.  Possible UTI She reports a 1 week history of decreased urination where she is having to strain more to evacuate her bladder.  She denies any pain, fevers, chills, nausea, vomiting, hematuria.  She feels that she is hydrating adequately.  Her spouse is present today and he notes that he typically gives her about 2 bottles of water and 1 bottle of Pedialyte per day.   ROS: Per HPI  Allergies  Allergen Reactions   Bactrim  [Sulfamethoxazole -Trimethoprim ] Nausea And Vomiting   Dilaudid [Hydromorphone Hcl] Anaphylaxis   Eliquis  [Apixaban ] Other (See Comments)    Caused bleed   Acyclovir And Related Nausea And Vomiting   Crestor  [Rosuvastatin  Calcium ] Other (See Comments)    Muscle pain   Gabapentin Nausea And Vomiting and Swelling    Swelling in feet and in legs   Aleve [Naproxen Sodium] Rash   Lyrica [Pregabalin] Other (See Comments)    Swelling in feet, and in legs   Past Medical History:  Diagnosis Date   Cataract    Depression    GERD (gastroesophageal reflux disease)    Hyperlipidemia    Hypertension    Hypothyroidism    IBS (irritable bowel syndrome)    Osteoarthritis    Raynaud  disease    S/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 Wendel & Bartle   Scleroderma [facility])    Thyroid  disease    hypothyroidism    Current Outpatient Medications:    acetaminophen  (TYLENOL ) 500 MG tablet, Take 1,000 mg by mouth at bedtime., Disp: , Rfl:    acidophilus (RISAQUAD) CAPS capsule, Take 2 capsules by mouth daily., Disp: , Rfl:    aspirin  EC 81 MG tablet, Take 81 mg by mouth daily. Swallow whole., Disp: , Rfl:    clonazePAM  (KLONOPIN ) 0.5 MG tablet, Take 1 tablet (0.5 mg total) by mouth 2 (two) times daily as needed for anxiety., Disp: 180 tablet, Rfl: 1   ezetimibe  (ZETIA ) 10 MG tablet, Take 1 tablet (10 mg total) by mouth daily., Disp: 90 tablet, Rfl: 1   FLUoxetine  (PROZAC ) 40 MG capsule, Take 1 capsule (40 mg total) by mouth daily., Disp: 90 capsule, Rfl: 1   fluticasone  (FLONASE ) 50 MCG/ACT nasal spray, PLACE 2 SPRAYS INTO BOTH NOSTRILS DAILY (Patient taking differently: Place 2 sprays into both nostrils as needed.), Disp: 16 g, Rfl: 6   NIFEdipine  (PROCARDIA  XL/NIFEDICAL XL) 60 MG 24 hr tablet, Take 60 mg by mouth daily., Disp: , Rfl:    NONFORMULARY OR COMPOUNDED ITEM, Take 1 mg by  mouth at bedtime. Low dose naltrexone (Patient taking differently: Take 1 mg by mouth at bedtime. Low dose naltrexone Anti-inflammatory with strict diet to avoid inflammation), Disp: 90 each, Rfl: 3   omeprazole  (PRILOSEC) 20 MG capsule, TAKE 1 TABLET DAILY, Disp: 90 capsule, Rfl: 1   PROLIA  60 MG/ML SOSY injection, Inject 60 mg into the skin every 6 (six) months., Disp: 180 mL, Rfl: 0   RESTASIS  0.05 % ophthalmic emulsion, Place 1 drop into both eyes 2 (two) times daily., Disp: , Rfl:    sildenafil  (REVATIO ) 20 MG tablet, Take 1 tablet (20 mg total) by mouth 2 (two) times daily., Disp: 60 tablet, Rfl: 5   SYNTHROID  75 MCG tablet, TAKE (1) TABLET DAILY BE- FORE BREAKFAST., Disp: 90 tablet, Rfl: 1 Social History    Socioeconomic History   Marital status: Married    Spouse name: Not on file   Number of children: 2   Years of education: Not on file   Highest education level: Some college, no degree  Occupational History   Occupation: retired holiday representative at [employer]  Tobacco Use   Smoking status: Never   Smokeless tobacco: Never   Tobacco comments:    passive tobacco smoke exposure as child  Vaping Use   Vaping status: Never Used  Substance and Sexual Activity   Alcohol use: No   Drug use: No   Sexual activity: Not Currently    Birth control/protection: Surgical  Other Topics Concern   Not on file  Social History Narrative   Regular exercise: walkingCaffeine use: 1 cup of coffee daily   Lives in split level home with her husband   Social Drivers of Corporate Investment Banker Strain: Low Risk  (06/08/2023)   Overall Financial Resource Strain (CARDIA)    Difficulty of Paying Living Expenses: Not hard at all  Food Insecurity: No Food Insecurity (06/08/2023)   Hunger Vital Sign    Worried About Running Out of Food in the Last Year: Never true    Ran Out of Food in the Last Year: Never true  Transportation Needs: No Transportation Needs (06/08/2023)   PRAPARE - Administrator, Civil Service (Medical): No    Lack of Transportation (Non-Medical): No  Physical Activity: Inactive (06/08/2023)   Exercise Vital Sign    Days of Exercise per Week: 0 days    Minutes of Exercise per Session: 0 min  Stress: No Stress Concern Present (06/08/2023)   [employer] of Occupational Health - Occupational Stress Questionnaire    Feeling of Stress : Not at all  Social Connections: Moderately Integrated (06/08/2023)   Social Connection and Isolation Panel [NHANES]    Frequency of Communication with Friends and Family: More than three times a week    Frequency of Social Gatherings with Friends and Family: Three times a week    Attends Religious Services: More than 4 times per year     Active Member of Clubs or Organizations: No    Attends Banker Meetings: Never    Marital Status: Married  Catering Manager Violence: Not At Risk (06/08/2023)   Humiliation, Afraid, Rape, and Kick questionnaire    Fear of Current or Ex-Partner: No    Emotionally Abused: No    Physically Abused: No    Sexually Abused: No   Family History  Problem Relation Age of Onset   Pancreatic cancer Father    Raynaud syndrome Father    Cancer Father  pancreatic   Rashes / Skin problems Daughter        possibly scleraderma   Hip fracture Mother    Mental illness Mother        attempted suicide at 38 yo    Objective: Office vital signs reviewed. BP (!) 109/56   Pulse 75   Temp 98.3 F (36.8 C)   Ht 5' 2 (1.575 m)   Wt 104 lb (47.2 kg)   SpO2 94%   BMI 19.02 kg/m   Physical Examination:  General: Awake, alert, thin elderly female, No acute distress HEENT: sclera white, MMM, bilateral TMs obstructed by cerumen.  No gross soft tissue swelling noted externally MSK: arrives to office in wheelchair Neuro: no nystagmus  Assessment/ Plan: 83 y.o. female   Bilateral impacted cerumen  Dizziness  Difficulty urinating - Plan: Urinalysis, Routine w reflex microscopic, Urine Culture  Cerumen was removed successfully with irrigation.  I suspect dizziness is secondary to inadequate hydration and orthostasis.  Her urinalysis showed some evidence of dehydration with ketones and protein but was negative for evidence of infection.  We discussed potential etiologies of difficulty urinating including dehydration but also cystocele.  If she has ongoing symptomology despite increasing hydration would recommend referral to urogynecology for further evaluation.  Close follow-up scheduled PCP to address multiple other chronic concerns that she had today.   Norene CHRISTELLA Fielding, DO [facility] [phone]

## 2023-07-21 LAB — URINE CULTURE

## 2023-07-23 ENCOUNTER — Ambulatory Visit: Payer: Medicare Other | Admitting: Nurse Practitioner

## 2023-07-23 ENCOUNTER — Encounter: Payer: Self-pay | Admitting: Nurse Practitioner

## 2023-07-23 VITALS — BP 137/64 | HR 91 | Temp 99.4°F | Ht 62.0 in | Wt 101.0 lb

## 2023-07-23 DIAGNOSIS — D508 Other iron deficiency anemias: Secondary | ICD-10-CM

## 2023-07-23 DIAGNOSIS — R531 Weakness: Secondary | ICD-10-CM | POA: Diagnosis not present

## 2023-07-23 LAB — HEMOGLOBIN, FINGERSTICK: Hemoglobin: 8.2 g/dL — ABNORMAL LOW (ref 11.1–15.9)

## 2023-07-23 NOTE — Progress Notes (Signed)
Subjective:    Patient ID: CHARESSE VANOVER, female    DOB: 05/28/1941, 83 y.o.   MRN: [id number]   Chief Complaint: Fatigue (Shaking all over. Feeling bad)   HPI  Patient comes in with several complaints: Patient come in c/o feeling bad. Has no energy to do anything. Feels very shaky. She has history of iron defiecieny anemia and has had multiple iron infusions. She is not scheduled for any more infusions at this time. Lab Results  Component Value Date   HGB 12.7 05/20/2023    Patient Active Problem List   Diagnosis Date Noted   Syncope 05/19/2023   Pulmonary HTN (HCC) 12/18/2022   Heart block AV complete (HCC) 05/27/2022   S/P TAVR (transcatheter aortic valve replacement) 05/20/2022   Anxiety 05/14/2022   Hyperkalemia 05/14/2022   Chronic diastolic CHF (congestive heart failure) (HCC) 05/13/2022   Acute respiratory distress 05/13/2022   Nonrheumatic aortic valve stenosis 04/28/2022   Precordial chest pain 03/13/2022   History of adenomatous polyp of colon 09/21/2020   Weight loss 09/21/2020   Irritable bowel syndrome 09/21/2020   Breast implant rupture 09/04/2020   Iron deficiency anemia due to chronic blood loss 08/28/2020   DOE (dyspnea on exertion) 11/18/2017   Murmur 11/18/2017   Impingement syndrome of left shoulder region 12/08/2014   Osteoporosis 09/20/2014   Hip fracture requiring operative repair (HCC) 02/22/2014   Hypothyroidism 11/29/2013   Depression 11/29/2013   GERD (gastroesophageal reflux disease) 11/29/2013   Hypertension 11/29/2013   Hyperlipidemia with target LDL less than 100 11/29/2013   Thyroid cyst 04/26/2013   Melanoma of skin (HCC) 08/07/2010   [name] 08/06/2010   SCLERODERMA 08/06/2010   Osteoarthritis 08/06/2010       Review of Systems  Constitutional:  Negative for diaphoresis.  Eyes:  Negative for pain.  Respiratory:  Negative for shortness of breath.   Cardiovascular:  Negative for chest pain, palpitations and leg  swelling.  Gastrointestinal:  Negative for abdominal pain.  Endocrine: Negative for polydipsia.  Skin:  Negative for rash.  Neurological:  Negative for dizziness, weakness and headaches.  Hematological:  Does not bruise/bleed easily.  All other systems reviewed and are negative.      Objective:   Physical Exam Constitutional:      Appearance: Normal appearance.  Cardiovascular:     Rate and Rhythm: Normal rate and regular rhythm.     Heart sounds: Normal heart sounds.  Pulmonary:     Breath sounds: Normal breath sounds.  Skin:    General: Skin is warm.  Neurological:     General: No focal deficit present.     Mental Status: She is alert and oriented to person, place, and time.  Psychiatric:        Mood and Affect: Mood normal.        Behavior: Behavior normal.    BP 137/64   Pulse 91   Temp 99.4 F (37.4 C) (Temporal)   Ht 5\' 2"  (1.575 m)   Wt 101 lb (45.8 kg)   BMI 18.47 kg/m   hemoglobin- 8.6%       Assessment & Plan:  [name] in today with chief complaint of Fatigue (Shaking all over. Feeling bad)   1. Weakness (Primary) - Hemoglobin, fingerstick  2. Other iron deficiency anemia Will do referral to hematlogy    The above assessment and management plan was discussed with the patient. The patient verbalized understanding of and has agreed to the management plan. Patient  is aware to call the clinic if symptoms persist or worsen. Patient is aware when to return to the clinic for a follow-up visit. Patient educated on when it is appropriate to go to the emergency department.   Mary-Margaret Daphine Deutscher, FNP

## 2023-07-27 ENCOUNTER — Other Ambulatory Visit: Payer: Self-pay | Admitting: *Deleted

## 2023-07-27 ENCOUNTER — Telehealth: Payer: Self-pay | Admitting: *Deleted

## 2023-07-27 DIAGNOSIS — D500 Iron deficiency anemia secondary to blood loss (chronic): Secondary | ICD-10-CM

## 2023-07-27 DIAGNOSIS — D649 Anemia, unspecified: Secondary | ICD-10-CM

## 2023-07-27 NOTE — Telephone Encounter (Signed)
She went to her PCP for weakness and generalized bad feeling. They did a fingerstick hgb, which was 8.2 .  Discussed with Rojelio Brenner, PAC and we will bring her in tomorrow for CBCD/Iron/Ferritin & BB.  Patient aware.

## 2023-07-28 ENCOUNTER — Telehealth: Payer: Self-pay | Admitting: *Deleted

## 2023-07-28 ENCOUNTER — Other Ambulatory Visit: Payer: Self-pay | Admitting: Physician Assistant

## 2023-07-28 ENCOUNTER — Inpatient Hospital Stay: Payer: Medicare Other | Attending: Hematology

## 2023-07-28 DIAGNOSIS — D500 Iron deficiency anemia secondary to blood loss (chronic): Secondary | ICD-10-CM

## 2023-07-28 DIAGNOSIS — D509 Iron deficiency anemia, unspecified: Secondary | ICD-10-CM | POA: Insufficient documentation

## 2023-07-28 DIAGNOSIS — D649 Anemia, unspecified: Secondary | ICD-10-CM

## 2023-07-28 LAB — FERRITIN: Ferritin: 11 ng/mL (ref 11–307)

## 2023-07-28 LAB — CBC WITH DIFFERENTIAL/PLATELET
Abs Immature Granulocytes: 0.01 10*3/uL (ref 0.00–0.07)
Basophils Absolute: 0.1 10*3/uL (ref 0.0–0.1)
Basophils Relative: 1 %
Eosinophils Absolute: 0.2 10*3/uL (ref 0.0–0.5)
Eosinophils Relative: 4 %
HCT: 28 % — ABNORMAL LOW (ref 36.0–46.0)
Hemoglobin: 8.5 g/dL — ABNORMAL LOW (ref 12.0–15.0)
Immature Granulocytes: 0 %
Lymphocytes Relative: 20 %
Lymphs Abs: 1 10*3/uL (ref 0.7–4.0)
MCH: 27.6 pg (ref 26.0–34.0)
MCHC: 30.4 g/dL (ref 30.0–36.0)
MCV: 90.9 fL (ref 80.0–100.0)
Monocytes Absolute: 0.5 10*3/uL (ref 0.1–1.0)
Monocytes Relative: 9 %
Neutro Abs: 3.3 10*3/uL (ref 1.7–7.7)
Neutrophils Relative %: 66 %
Platelets: 324 10*3/uL (ref 150–400)
RBC: 3.08 MIL/uL — ABNORMAL LOW (ref 3.87–5.11)
RDW: 15.9 % — ABNORMAL HIGH (ref 11.5–15.5)
WBC: 5 10*3/uL (ref 4.0–10.5)
nRBC: 0 % (ref 0.0–0.2)

## 2023-07-28 LAB — IRON AND TIBC
Iron: 28 ug/dL (ref 28–170)
Saturation Ratios: 8 % — ABNORMAL LOW (ref 10.4–31.8)
TIBC: 364 ug/dL (ref 250–450)
UIBC: 336 ug/dL

## 2023-07-28 LAB — SAMPLE TO BLOOD BANK

## 2023-07-28 NOTE — Telephone Encounter (Signed)
Labs reviewed by Rojelio Brenner, PAC.  Orders received for IV Feraheme x 3 & Repeat CBC/BB sample in 2 weeks.  Due to significant drop in Hgb, she was advised to reach out to her gastroenterologist as well.  Patient verbalized understanding and appts made.

## 2023-07-28 NOTE — Progress Notes (Signed)
Remote pacemaker transmission.   

## 2023-07-28 NOTE — Progress Notes (Signed)
Labs reviewed (07/28/2023): Hgb has dropped to 8.5 (down from 12.7 on 05/20/2023).  Ferritin 11, iron saturation 8%. We will schedule patient for IV Feraheme x 3.  Patient also advised to reach out to gastroenterologist due to significant decrease in hemoglobin.  We will check repeat CBC/BB sample in 2 weeks.  Carnella Guadalajara, PA-C 07/28/23 5:38 PM

## 2023-07-31 ENCOUNTER — Inpatient Hospital Stay: Payer: Medicare Other

## 2023-07-31 VITALS — BP 127/57 | HR 77 | Temp 97.3°F | Resp 18

## 2023-07-31 DIAGNOSIS — D509 Iron deficiency anemia, unspecified: Secondary | ICD-10-CM | POA: Diagnosis not present

## 2023-07-31 DIAGNOSIS — D500 Iron deficiency anemia secondary to blood loss (chronic): Secondary | ICD-10-CM

## 2023-07-31 MED ORDER — CETIRIZINE HCL 10 MG/ML IV SOLN: 5.0000 mg | Freq: Once | INTRAVENOUS | Status: DC

## 2023-07-31 MED ORDER — SODIUM CHLORIDE 0.9 % IV SOLN
510.0000 mg | Freq: Once | INTRAVENOUS | Status: AC
  Administered 2023-07-31: 510 mg via INTRAVENOUS
  Filled 2023-07-31: qty 510

## 2023-07-31 MED ORDER — SODIUM CHLORIDE 0.9 % IV SOLN: INTRAVENOUS | Status: DC

## 2023-07-31 MED ORDER — ACETAMINOPHEN 325 MG PO TABS: 650.0000 mg | ORAL_TABLET | Freq: Once | ORAL | Status: DC

## 2023-07-31 NOTE — Patient Instructions (Signed)

## 2023-07-31 NOTE — Progress Notes (Signed)
Patient tolerated iron infusion with no complaints voiced.  Peripheral IV site clean and dry with good blood return noted before and after infusion.  Band aid applied. Pt observed for 30 minutes post iron infusion without any complications.  VSS with discharge and left in satisfactory condition with no s/s of distress noted. All follow ups as scheduled.   Walaa Carel [employer]

## 2023-08-02 ENCOUNTER — Encounter (HOSPITAL_COMMUNITY): Payer: Self-pay

## 2023-08-02 ENCOUNTER — Other Ambulatory Visit: Payer: Self-pay

## 2023-08-02 ENCOUNTER — Emergency Department (HOSPITAL_COMMUNITY): Payer: Medicare Other

## 2023-08-02 ENCOUNTER — Emergency Department (HOSPITAL_COMMUNITY)
Admission: EM | Admit: 2023-08-02 | Discharge: 2023-08-03 | Disposition: A | Discharge status: Home / Self Care | Payer: Medicare Other | Attending: Emergency Medicine | Admitting: Emergency Medicine

## 2023-08-02 DIAGNOSIS — I2510 Atherosclerotic heart disease of native coronary artery without angina pectoris: Secondary | ICD-10-CM | POA: Diagnosis not present

## 2023-08-02 DIAGNOSIS — R0609 Other forms of dyspnea: Secondary | ICD-10-CM

## 2023-08-02 DIAGNOSIS — E039 Hypothyroidism, unspecified: Secondary | ICD-10-CM | POA: Diagnosis not present

## 2023-08-02 DIAGNOSIS — R531 Weakness: Secondary | ICD-10-CM | POA: Diagnosis not present

## 2023-08-02 DIAGNOSIS — I10 Essential (primary) hypertension: Secondary | ICD-10-CM | POA: Insufficient documentation

## 2023-08-02 DIAGNOSIS — R0602 Shortness of breath: Secondary | ICD-10-CM | POA: Diagnosis not present

## 2023-08-02 DIAGNOSIS — I6523 Occlusion and stenosis of bilateral carotid arteries: Secondary | ICD-10-CM | POA: Diagnosis not present

## 2023-08-02 DIAGNOSIS — Z950 Presence of cardiac pacemaker: Secondary | ICD-10-CM | POA: Diagnosis not present

## 2023-08-02 DIAGNOSIS — R918 Other nonspecific abnormal finding of lung field: Secondary | ICD-10-CM | POA: Diagnosis not present

## 2023-08-02 DIAGNOSIS — R7309 Other abnormal glucose: Secondary | ICD-10-CM | POA: Insufficient documentation

## 2023-08-02 DIAGNOSIS — J90 Pleural effusion, not elsewhere classified: Secondary | ICD-10-CM | POA: Diagnosis not present

## 2023-08-02 DIAGNOSIS — R590 Localized enlarged lymph nodes: Secondary | ICD-10-CM | POA: Diagnosis not present

## 2023-08-02 DIAGNOSIS — R519 Headache, unspecified: Secondary | ICD-10-CM | POA: Diagnosis not present

## 2023-08-02 LAB — CBC
HCT: 23.8 % — ABNORMAL LOW (ref 36.0–46.0)
Hemoglobin: 8.6 g/dL — ABNORMAL LOW (ref 12.0–15.0)
MCH: 33.9 pg (ref 26.0–34.0)
MCHC: 36.1 g/dL — ABNORMAL HIGH (ref 30.0–36.0)
MCV: 93.7 fL (ref 80.0–100.0)
Platelets: 322 10*3/uL (ref 150–400)
RBC: 2.54 MIL/uL — ABNORMAL LOW (ref 3.87–5.11)
RDW: 16.7 % — ABNORMAL HIGH (ref 11.5–15.5)
WBC: 8.5 10*3/uL (ref 4.0–10.5)
nRBC: 0 % (ref 0.0–0.2)

## 2023-08-02 LAB — MAGNESIUM: Magnesium: 2.1 mg/dL (ref 1.7–2.4)

## 2023-08-02 LAB — BASIC METABOLIC PANEL
Anion gap: 10 (ref 5–15)
BUN: 9 mg/dL (ref 8–23)
CO2: 21 mmol/L — ABNORMAL LOW (ref 22–32)
Calcium: 8.9 mg/dL (ref 8.9–10.3)
Chloride: 106 mmol/L (ref 98–111)
Creatinine, Ser: 0.87 mg/dL (ref 0.44–1.00)
GFR, Estimated: >60 mL/min (ref >60)
Glucose, Bld: 105 mg/dL — ABNORMAL HIGH (ref 70–99)
Potassium: 3.8 mmol/L (ref 3.5–5.1)
Sodium: 137 mmol/L (ref 135–145)

## 2023-08-02 MED ORDER — IOHEXOL 350 MG/ML SOLN
75.0000 mL | Freq: Once | INTRAVENOUS | Status: AC | PRN
  Administered 2023-08-03: 75 mL via INTRAVENOUS

## 2023-08-02 NOTE — ED Provider Notes (Incomplete)
[facility]PT [facility] Emergency Department Provider Note MRN:  [id number]  Arrival date & time: 08/02/23     Chief Complaint   Weakness   History of Present Illness   Haley Trevino is a 83 y.o. year-old female with a history of complete heart block, aortic stenosis, TAVR, pacemaker presenting to the ED with chief complaint of weakness.  Persistent weakness and dyspnea on exertion over the past several months.  Thought that her symptoms would not improve after the valve repair and pacemaker.  Has required admission in the past for continued syncopal episodes after these procedures.  More recently has been having issues with anemia, receiving iron transfusions.  Gets very tired and winded after walking down 6 steps at home into her living room.  Frequent near syncopal episodes throughout the day.  Nighttime headaches new over the past month.  Denies fever or cough or cold-like symptoms, no chest pain, no abdominal pain, no numbness or weakness to the arms or legs, no vision loss.  Here this evening because she felt generally unwell and weak and checked her vitals at home and her pulse ox was in the 70s, blood pressure read as low which concerned her, here for evaluation.  Review of Systems  A thorough review of systems was obtained and all systems are negative except as noted in the HPI and PMH.   Patient's Health History    Past Medical History:  Diagnosis Date  . Cataract   . Depression   . GERD (gastroesophageal reflux disease)   . Hyperlipidemia   . Hypertension   . Hypothyroidism   . IBS (irritable bowel syndrome)   . Osteoarthritis   . Raynaud disease   . S/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 Lynnette Caffey & Laneta Simmers  . Scleroderma (HCC)   . Thyroid disease    hypothyroidism    Past Surgical History:  Procedure Laterality Date  . BREAST ENHANCEMENT SURGERY  1975  . BREAST  IMPLANT REMOVAL Bilateral 04/23/2016   Procedure: REMOVALBILATERAL BREAST IMPLANTS;  Surgeon: Peggye Form, DO;  Location: [facility];  Service: Plastics;  Laterality: Bilateral;  . CHOLECYSTECTOMY  1973  . ESOPHAGOGASTRODUODENOSCOPY N/A 10/25/2020   Procedure: ESOPHAGOGASTRODUODENOSCOPY (EGD);  Surgeon: Vida Rigger, MD;  Location: [facility];  Service: Endoscopy;  Laterality: N/A;  enteroscopy  . HAND SURGERY  08/2012; 11/2012  . HOT HEMOSTASIS N/A 10/25/2020   Procedure: HOT HEMOSTASIS (ARGON PLASMA COAGULATION/BICAP);  Surgeon: Vida Rigger, MD;  Location: [facility];  Service: Endoscopy;  Laterality: N/A;  . INTRAOPERATIVE TRANSTHORACIC ECHOCARDIOGRAM N/A 05/13/2022   Procedure: INTRAOPERATIVE TRANSTHORACIC ECHOCARDIOGRAM;  Surgeon: Kathleene Hazel, MD;  [facility]: [facility]VAS[facility] LAB;  Service: Open Heart Surgery;  Laterality: N/A;  . INTRAOPERATIVE TRANSTHORACIC ECHOCARDIOGRAM N/A 05/20/2022   Procedure: INTRAOPERATIVE TRANSTHORACIC ECHOCARDIOGRAM;  Surgeon: Orbie Pyo, MD;  Location: [facility] OR;  Service: Open Heart Surgery;  Laterality: N/A;  . IR RADIOLOGIST EVAL & MGMT  07/27/2017  . IR SACROPLASTY BILATERAL  07/31/2017  . IR VERTEBROPLASTY CERV/THOR BX INC UNI/BIL INC/INJECT/IMAGING  03/13/2020  . IR VERTEBROPLASTY EA ADDL (T&LS) BX INC UNI/BIL INC INJECT/IMAGING  03/14/2020  . MELANOMA EXCISION     at 55 yrs of age  . ORIF HIP FRACTURE Right 02/22/2014   Procedure: OPEN REDUCTION INTERNAL FIXATION RIGHT HIP;  Surgeon: Darreld Mclean, MD;  Location: [facility];  Service: Orthopedics;  Laterality: Right;  .  PACEMAKER IMPLANT N/A 05/27/2022   Procedure: PACEMAKER IMPLANT;  Surgeon: Lanier Prude, MD;  Location[facility] INVASIVE CV LAB;  Service: Cardiovascular;  Laterality: N/A;  . RIGHT HEART CATH N/A 01/16/2023   Procedure: RIGHT HEART CATH;  Surgeon: Dorthula Nettles, DO;  Location: [facility];  Service: Cardiovascular;  Laterality: N/A;  . RIGHT HEART CATH  AND CORONARY ANGIOGRAPHY N/A 03/28/2022   Procedure: RIGHT HEART CATH AND CORONARY ANGIOGRAPHY;  Surgeon: Orbie Pyo, MD;  Location: [facility];  Service: Cardiovascular;  Laterality: N/A;  . TOTAL ABDOMINAL HYSTERECTOMY  1974  . TRANSCATHETER AORTIC VALVE REPLACEMENT, TRANSFEMORAL Left 05/13/2022   Procedure: Transcatheter Aortic Valve Replacement, Transfemoral;  Surgeon: Kathleene Hazel, MD;  Location: [facility];  Service: Open Heart Surgery;  Laterality: Left;  . TRANSCATHETER AORTIC VALVE REPLACEMENT, TRANSFEMORAL Left 05/20/2022   Procedure: Transcatheter Aortic Valve Replacement, Transfemoral using an Edwards SAPIEN 3 Ultra 23 MM Heart Valve;  Surgeon: Orbie Pyo, MD;  Location: [facility];  Service: Open Heart Surgery;  Laterality: Left;  Left Transfemoral    Family History  Problem Relation Age of Onset  . Pancreatic cancer Father   . Raynaud syndrome Father   . Cancer Father        pancreatic  . Rashes / Skin problems Daughter        possibly scleraderma  . Hip fracture Mother   . Mental illness Mother        attempted suicide at 62 yo    Social History   Socioeconomic History  . Marital status: Married    Spouse name: Not on file  . Number of children: 2  . Years of education: Not on file  . Highest education level: Some college, no degree  Occupational History  . Occupation: retired Holiday representative at [employer]  Tobacco Use  . Smoking status: Never  . Smokeless tobacco: Never  . Tobacco comments:    passive tobacco smoke exposure as child  Vaping Use  . Vaping status: Never Used  Substance and Sexual Activity  . Alcohol use: No  . Drug use: No  . Sexual activity: Not Currently    Birth control/protection: Surgical  Other Topics Concern  . Not on file  Social History Narrative   Regular exercise: walkingCaffeine use: 1 cup of coffee daily   Lives in split level home with her husband   Social Drivers of Health   Financial Resource  Strain: Low Risk  (06/08/2023)   Overall Financial Resource Strain (CARDIA)   . Difficulty of Paying Living Expenses: Not hard at all  Food Insecurity: No Food Insecurity (06/08/2023)   Hunger Vital Sign   . Worried About Programme researcher, broadcasting/film/video in the Last Year: Never true   . Ran Out of Food in the Last Year: Never true  Transportation Needs: No Transportation Needs (06/08/2023)   PRAPARE - Transportation   . Lack of Transportation (Medical): No   . Lack of Transportation (Non-Medical): No  Physical Activity: Inactive (06/08/2023)   Exercise Vital Sign   . Days of Exercise per Week: 0 days   . Minutes of Exercise per Session: 0 min  Stress: No Stress Concern Present (06/08/2023)   [employer] of Occupational Health - Occupational Stress Questionnaire   . Feeling of Stress : Not at all  Social Connections: Moderately Integrated (06/08/2023)   Social Connection and Isolation Panel [NHANES]   . Frequency of Communication with Friends and Family: More than  three times a week   . Frequency of Social Gatherings with Friends and Family: Three times a week   . Attends Religious Services: More than 4 times per year   . Active Member of Clubs or Organizations: No   . Attends Banker Meetings: Never   . Marital Status: Married  Catering manager Violence: Not At Risk (06/08/2023)   Humiliation, Afraid, Rape, and Kick questionnaire   . Fear of Current or Ex-Partner: No   . Emotionally Abused: No   . Physically Abused: No   . Sexually Abused: No     Physical Exam   Vitals:   08/02/23 2149  BP: 134/64  Pulse: 78  Resp: 16  Temp: 98.9 F (37.2 C)  SpO2: 94%    CONSTITUTIONAL: Chronically ill-appearing, NAD NEURO/PSYCH:  Alert and oriented x 3, no focal deficits EYES:  eyes equal and reactive ENT/NECK:  no LAD, no JVD CARDIO: Regular rate, well-perfused, normal S1 and S2 PULM:  CTAB no wheezing or rhonchi GI/GU:  non-distended, non-tender MSK/SPINE:  No gross deformities,  no edema SKIN:  no rash, atraumatic   *Additional and/or pertinent findings included in MDM below  Diagnostic and Interventional Summary    EKG Interpretation Date/Time:  Sunday August 02 2023 22:00:34 EST Ventricular Rate:  75 PR Interval:  170 QRS Duration:  78 QT Interval:  442 QTC Calculation: 493 R Axis:   49  Text Interpretation: Normal sinus rhythm Cannot rule out Anterior infarct (cited on or before 18-Dec-2022) Abnormal ECG When compared with ECG of 19-May-2023 14:33, Premature supraventricular complexes are no longer Present QRS axis Shifted right Questionable change in initial forces of Anterior leads T wave inversion no longer evident in Anterior leads Confirmed by Kennis Carina [phone]) on 08/02/2023 10:57:08 PM       Labs Reviewed  BASIC METABOLIC PANEL - Abnormal; Notable for the following components:      Result Value   CO2 21 (*)    Glucose, Bld 105 (*)    All other components within normal limits  CBC - Abnormal; Notable for the following components:   RBC 2.54 (*)    Hemoglobin 8.6 (*)    HCT 23.8 (*)    MCHC 36.1 (*)    RDW 16.7 (*)    All other components within normal limits  URINALYSIS, ROUTINE W REFLEX MICROSCOPIC  MAGNESIUM  CBG MONITORING, ED    CT HEAD WO CONTRAST ( )    (Results Pending)  CT Angio Chest Pulmonary Embolism (PE) W or WO Contrast    (Results Pending)    Medications - No data to display   Procedures  /  Critical Care Procedures  ED Course and Medical Decision Making  Initial Impression and Ddx ***  Past medical/surgical history that increases complexity of ED encounter:  ***  Interpretation of Diagnostics I personally reviewed the {BEROINTERP:26835} and my interpretation is as follows:  ***  ***  Patient Reassessment and Ultimate Disposition/Management     ***  Patient management required discussion with the following services or consulting groups:  {BEROCONSULT:26841}  Complexity of Problems  Addressed {BEROCOPA:26833}  Additional Data Reviewed and Analyzed Further history obtained from: {BERODATA:26834}  Additional Factors Impacting ED Encounter Risk {UEAVWUJW:11914}  Elmer Sow. Pilar Plate, MD [facility] Emergency Medicine [facility] [email or web address]  Final Clinical Impressions(s) / ED Diagnoses  No diagnosis found.  ED Discharge Orders     None        Discharge Instructions Discussed with and Provided  to Patient:   Discharge Instructions   None

## 2023-08-02 NOTE — ED Provider Notes (Signed)
[facility]-EMERGENCY DEPT [facility] Emergency Department Provider Note MRN:  [id number]  Arrival date & time: 08/03/23     Chief Complaint   Weakness   History of Present Illness   Haley Trevino is a 83 y.o. year-old female with a history of complete heart block, aortic stenosis, TAVR, pacemaker presenting to the ED with chief complaint of weakness.  Persistent weakness and dyspnea on exertion over the past several months.  Thought that her symptoms would not improve after the valve repair and pacemaker.  Has required admission in the past for continued syncopal episodes after these procedures.  More recently has been having issues with anemia, receiving iron transfusions.  Gets very tired and winded after walking down 6 steps at home into her living room.  Frequent near syncopal episodes throughout the day.  Nighttime headaches new over the past month.  Denies fever or cough or cold-like symptoms, no chest pain, no abdominal pain, no numbness or weakness to the arms or legs, no vision loss.  Here this evening because she felt generally unwell and weak and checked her vitals at home and her pulse ox was in the 70s, blood pressure read as low which concerned her, here for evaluation.  Review of Systems  A thorough review of systems was obtained and all systems are negative except as noted in the HPI and PMH.   Patient's Health History    Past Medical History:  Diagnosis Date   Cataract    Depression    GERD (gastroesophageal reflux disease)    Hyperlipidemia    Hypertension    Hypothyroidism    IBS (irritable bowel syndrome)    Osteoarthritis    Raynaud disease    S/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 Lynnette Caffey & Bartle   Scleroderma [facility])    Thyroid disease    hypothyroidism    Past Surgical History:  Procedure Laterality Date   BREAST ENHANCEMENT SURGERY  1975   BREAST IMPLANT  REMOVAL Bilateral 04/23/2016   Procedure: REMOVALBILATERAL BREAST IMPLANTS;  Surgeon: Peggye Form, DO;  Location: [facility];  Service: Plastics;  Laterality: Bilateral;   CHOLECYSTECTOMY  1973   ESOPHAGOGASTRODUODENOSCOPY N/A 10/25/2020   Procedure: ESOPHAGOGASTRODUODENOSCOPY (EGD);  Surgeon: Vida Rigger, MD;  Location: [facility];  Service: Endoscopy;  Laterality: N/A;  enteroscopy   HAND SURGERY  08/2012; 11/2012   HOT HEMOSTASIS N/A 10/25/2020   Procedure: HOT HEMOSTASIS (ARGON PLASMA COAGULATION/BICAP);  Surgeon: Vida Rigger, MD;  Location: [facility];  Service: Endoscopy;  Laterality: N/A;   INTRAOPERATIVE TRANSTHORACIC ECHOCARDIOGRAM N/A 05/13/2022   Procedure: INTRAOPERATIVE TRANSTHORACIC ECHOCARDIOGRAM;  Surgeon: Kathleene Hazel, MD;  [facility]: [facility] INVAS[facility] LAB;  Service: Open Heart Surgery;  Laterality: N/A;   INTRAOPERATIVE TRANSTHORACIC ECHOCARDIOGRAM N/A 05/20/2022   Procedure: INTRAOPERATIVE TRANSTHORACIC ECHOCARDIOGRAM;  Surgeon: Orbie Pyo, MD;  Location: [facility];  Service: Open Heart Surgery;  Laterality: N/A;   IR RADIOLOGIST EVAL & MGMT  07/27/2017   IR SACROPLASTY BILATERAL  07/31/2017   IR VERTEBROPLASTY CERV/THOR BX INC UNI/BIL INC/INJECT/IMAGING  03/13/2020   IR VERTEBROPLASTY EA ADDL (T&LS) BX INC UNI/BIL INC INJECT/IMAGING  03/14/2020   MELANOMA EXCISION     at 31 yrs of age   ORIF HIP FRACTURE Right 02/22/2014   Procedure: OPEN REDUCTION INTERNAL FIXATION RIGHT HIP;  Surgeon: Darreld Mclean, MD;  Location: [facility];  Service: Orthopedics;  Laterality: Right;  PACEMAKER IMPLANT N/A 05/27/2022   Procedure: PACEMAKER IMPLANT;  Surgeon: Lanier Prude, MD;  Location: [facility] INVASIVE CV LAB;  Service: Cardiovascular;  Laterality: N/A;   RIGHT HEART CATH N/A 01/16/2023   Procedure: RIGHT HEART CATH;  Surgeon: Dorthula Nettles, DO;  Location: [facility];  Service: Cardiovascular;  Laterality: N/A;   RIGHT HEART CATH AND CORONARY  ANGIOGRAPHY N/A 03/28/2022   Procedure: RIGHT HEART CATH AND CORONARY ANGIOGRAPHY;  Surgeon: Orbie Pyo, MD;  Location: [facility];  Service: Cardiovascular;  Laterality: N/A;   TOTAL ABDOMINAL HYSTERECTOMY  1974   TRANSCATHETER AORTIC VALVE REPLACEMENT, TRANSFEMORAL Left 05/13/2022   Procedure: Transcatheter Aortic Valve Replacement, Transfemoral;  Surgeon: Kathleene Hazel, MD;  Location: [facility];  Service: Open Heart Surgery;  Laterality: Left;   TRANSCATHETER AORTIC VALVE REPLACEMENT, TRANSFEMORAL Left 05/20/2022   Procedure: Transcatheter Aortic Valve Replacement, Transfemoral using an Edwards SAPIEN 3 Ultra 23 MM Heart Valve;  Surgeon: Orbie Pyo, MD;  Location: [facility];  Service: Open Heart Surgery;  Laterality: Left;  Left Transfemoral    Family History  Problem Relation Age of Onset   Pancreatic cancer Father    Raynaud syndrome Father    Cancer Father        pancreatic   Rashes / Skin problems Daughter        possibly scleraderma   Hip fracture Mother    Mental illness Mother        attempted suicide at 18 yo    Social History   Socioeconomic History   Marital status: Married    Spouse name: Not on file   Number of children: 2   Years of education: Not on file   Highest education level: Some college, no degree  Occupational History   Occupation: retired Holiday representative at [employer]  Tobacco Use   Smoking status: Never   Smokeless tobacco: Never   Tobacco comments:    passive tobacco smoke exposure as child  Vaping Use   Vaping status: Never Used  Substance and Sexual Activity   Alcohol use: No   Drug use: No   Sexual activity: Not Currently    Birth control/protection: Surgical  Other Topics Concern   Not on file  Social History Narrative   Regular exercise: walkingCaffeine use: 1 cup of coffee daily   Lives in split level home with her husband   Social Drivers of Corporate investment banker Strain: Low Risk  (06/08/2023)    Overall Financial Resource Strain (CARDIA)    Difficulty of Paying Living Expenses: Not hard at all  Food Insecurity: No Food Insecurity (06/08/2023)   Hunger Vital Sign    Worried About Running Out of Food in the Last Year: Never true    Ran Out of Food in the Last Year: Never true  Transportation Needs: No Transportation Needs (06/08/2023)   PRAPARE - Administrator, Civil Service (Medical): No    Lack of Transportation (Non-Medical): No  Physical Activity: Inactive (06/08/2023)   Exercise Vital Sign    Days of Exercise per Week: 0 days    Minutes of Exercise per Session: 0 min  Stress: No Stress Concern Present (06/08/2023)   [employer] of Occupational Health - Occupational Stress Questionnaire    Feeling of Stress : Not at all  Social Connections: Moderately Integrated (06/08/2023)   Social Connection and Isolation Panel [NHANES]    Frequency of Communication with Friends and Family: More than  three times a week    Frequency of Social Gatherings with Friends and Family: Three times a week    Attends Religious Services: More than 4 times per year    Active Member of Clubs or Organizations: No    Attends Banker Meetings: Never    Marital Status: Married  Catering manager Violence: Not At Risk (06/08/2023)   Humiliation, Afraid, Rape, and Kick questionnaire    Fear of Current or Ex-Partner: No    Emotionally Abused: No    Physically Abused: No    Sexually Abused: No     Physical Exam   Vitals:   08/03/23 0100 08/03/23 0207  BP: (!) 124/91 126/78  Pulse: 82 76  Resp: 20 15  Temp:    SpO2: 90% 92%    CONSTITUTIONAL: Chronically ill-appearing, NAD NEURO/PSYCH:  Alert and oriented x 3, no focal deficits EYES:  eyes equal and reactive ENT/NECK:  no LAD, no JVD CARDIO: Regular rate, well-perfused, normal S1 and S2 PULM:  CTAB no wheezing or rhonchi GI/GU:  non-distended, non-tender MSK/SPINE:  No gross deformities, no edema SKIN:  no rash,  atraumatic   *Additional and/or pertinent findings included in MDM below  Diagnostic and Interventional Summary    EKG Interpretation Date/Time:  Sunday August 02 2023 22:00:34 EST Ventricular Rate:  75 PR Interval:  170 QRS Duration:  78 QT Interval:  442 QTC Calculation: 493 R Axis:   49  Text Interpretation: Normal sinus rhythm Cannot rule out Anterior infarct (cited on or before 18-Dec-2022) Abnormal ECG When compared with ECG of 19-May-2023 14:33, Premature supraventricular complexes are no longer Present QRS axis Shifted right Questionable change in initial forces of Anterior leads T wave inversion no longer evident in Anterior leads Confirmed by Kennis Carina [phone]) on 08/02/2023 10:57:08 PM       Labs Reviewed  BASIC METABOLIC PANEL - Abnormal; Notable for the following components:      Result Value   CO2 21 (*)    Glucose, Bld 105 (*)    All other components within normal limits  CBC - Abnormal; Notable for the following components:   RBC 2.54 (*)    Hemoglobin 8.6 (*)    HCT 23.8 (*)    MCHC 36.1 (*)    RDW 16.7 (*)    All other components within normal limits  URINALYSIS, ROUTINE W REFLEX MICROSCOPIC - Abnormal; Notable for the following components:   Color, Urine STRAW (*)    Specific Gravity, Urine 1.032 (*)    Leukocytes,Ua TRACE (*)    All other components within normal limits  MAGNESIUM  CBG MONITORING, ED    CT HEAD WO CONTRAST ( )  Final Result    CT Angio Chest Pulmonary Embolism (PE) W or WO Contrast  Final Result      Medications  iohexol (OMNIPAQUE) 350 MG/ML injection 75 mL (75 mLs Intravenous Contrast Given 08/03/23 0013)     Procedures  /  Critical Care Procedures  ED Course and Medical Decision Making  Initial Impression and Ddx Chronic dyspnea on exertion for the past several weeks to months, mostly here tonight because of low oxygen/blood pressure numbers at home but did not feel any worse than normal.  Significant functional  impairment per patient, having trouble getting around the house but this has been going on for a while.  Differential diagnosis includes pulmonary edema, PE, anemia.  Past medical/surgical history that increases complexity of ED encounter: TAVR, CHF, pacemaker  Interpretation of Diagnostics  I personally reviewed the EKG and my interpretation is as follows: Sinus rhythm  Labs overall reassuring with no significant blood count or electrolyte disturbance.  PE study revealing some pulmonary edema, no PE.  Patient Reassessment and Ultimate Disposition/Management     Pacer interrogation reveals no abnormalities.  Patient did well on ambulation test with pulse ox here in the emergency department, no indication for further testing or admission, appropriate for discharge.  Has been prescribed Lasix in the past but currently not taking it, will advise small dose of daily Lasix for the next few days, encouraged prompt PCP or cardiology follow-up.  Patient management required discussion with the following services or consulting groups:  None  Complexity of Problems Addressed Acute illness or injury that poses threat of life of bodily function  Additional Data Reviewed and Analyzed Further history obtained from: Further history from spouse/family member  Additional Factors Impacting ED Encounter Risk Consideration of hospitalization  Elmer Sow. Pilar Plate, MD [facility] Emergency Medicine [facility] [email or web address]  Final Clinical Impressions(s) / ED Diagnoses     ICD-10-CM   1. Weakness  R53.1     2. DOE (dyspnea on exertion)  R06.09       ED Discharge Orders          Ordered    furosemide (LASIX) 20 MG tablet  Daily        08/03/23 0227             Discharge Instructions Discussed with and Provided to Patient:     Discharge Instructions      You were evaluated in the Emergency Department and after careful evaluation, we did not find any emergent  condition requiring admission or further testing in the hospital.  Your exam/testing today was overall reassuring.  Symptoms may be related to pulmonary edema.  Recommend use of the Lasix for the next 3 to 5 days, 20 mg daily to see if this helps.  Recommend close follow-up with your regular doctors, specifically your cardiologist.  Please return to the Emergency Department if you experience any worsening of your condition.  Thank you for allowing [place] to be a part of your care.        Sabas Sous, MD 08/03/23 [phone]

## 2023-08-02 NOTE — ED Triage Notes (Signed)
Pt reports she had an iron infusion on Friday and will have one again on Monday and her hgb has been 8.5.  Pt had a heart valve replacement and a pacemaker insertion at [facility] this past November but is still continuing to have syncopal episodes and was concerned that her BP and O2 at home was reading low.  Pt O2 and BP are stable in triage.

## 2023-08-03 ENCOUNTER — Emergency Department (HOSPITAL_COMMUNITY): Payer: Medicare Other

## 2023-08-03 DIAGNOSIS — R590 Localized enlarged lymph nodes: Secondary | ICD-10-CM | POA: Diagnosis not present

## 2023-08-03 DIAGNOSIS — I6523 Occlusion and stenosis of bilateral carotid arteries: Secondary | ICD-10-CM | POA: Diagnosis not present

## 2023-08-03 DIAGNOSIS — I2510 Atherosclerotic heart disease of native coronary artery without angina pectoris: Secondary | ICD-10-CM | POA: Diagnosis not present

## 2023-08-03 DIAGNOSIS — R918 Other nonspecific abnormal finding of lung field: Secondary | ICD-10-CM | POA: Diagnosis not present

## 2023-08-03 DIAGNOSIS — R519 Headache, unspecified: Secondary | ICD-10-CM | POA: Diagnosis not present

## 2023-08-03 DIAGNOSIS — J90 Pleural effusion, not elsewhere classified: Secondary | ICD-10-CM | POA: Diagnosis not present

## 2023-08-03 LAB — URINALYSIS, ROUTINE W REFLEX MICROSCOPIC
Bacteria, UA: NONE SEEN
Bilirubin Urine: NEGATIVE
Glucose, UA: NEGATIVE mg/dL
Hgb urine dipstick: NEGATIVE
Ketones, ur: NEGATIVE mg/dL
Nitrite: NEGATIVE
Protein, ur: NEGATIVE mg/dL
Specific Gravity, Urine: 1.032 — ABNORMAL HIGH (ref 1.005–1.030)
pH: 7 (ref 5.0–8.0)

## 2023-08-03 LAB — CBG MONITORING, ED: Glucose-Capillary: 85 mg/dL (ref 70–99)

## 2023-08-03 MED ORDER — FUROSEMIDE 20 MG PO TABS: 20.0000 mg | ORAL_TABLET | Freq: Every day | ORAL | 0 refills | Status: DC

## 2023-08-03 NOTE — Discharge Instructions (Addendum)
You were evaluated in the Emergency Department and after careful evaluation, we did not find any emergent condition requiring admission or further testing in the hospital.  Your exam/testing today was overall reassuring.  Symptoms may be related to pulmonary edema.  Recommend use of the Lasix for the next 3 to 5 days, 20 mg daily to see if this helps.  Recommend close follow-up with your regular doctors, specifically your cardiologist.  Please return to the Emergency Department if you experience any worsening of your condition.  Thank you for allowing [place] to be a part of your care.

## 2023-08-05 ENCOUNTER — Telehealth: Payer: Self-pay

## 2023-08-05 NOTE — Telephone Encounter (Signed)
-----   Message from Rollene Rotunda sent at 08/05/2023  3:19 PM EST ----- Regarding: RE: ED Patient Can we call her and offer a sooner follow up with an APP while I am out of town.  I don't see a follow up with her?  Thanks. ----- Message ----- From: Sabas Sous, MD Sent: 08/03/2023   2:30 AM EST To: Rollene Rotunda, MD Subject: ED Patient                                     Dr. Antoine Poche,  Patient of yours here in the emergency department early morning of 1-27 with persistent dyspnea on exertion.  History of TAVR, pacemaker.  Found to have mild pulmonary edema.  Advised to restart her Lasix that she has at home for the next few days and follow-up in the office.  She has follow-up scheduled with you over the next few weeks, wanted to make you aware in case you wanted to expedite this.  Thanks for the help.  Elmer Sow. Pilar Plate, MD [facility] Emergency Medicine [facility] [email or web address]

## 2023-08-05 NOTE — Telephone Encounter (Signed)
Attempted to call patient, no answer left message requesting a call back.

## 2023-08-06 NOTE — Telephone Encounter (Signed)
Patient identification verified by 2 forms. Marilynn Rail, RN    Called and spoke to patient  Patient scheduled for OV 2/6 at 2:45pm  Reviewed ED warning signs/precautions  Patient verbalized understanding, no questions at this time

## 2023-08-07 ENCOUNTER — Inpatient Hospital Stay: Payer: Medicare Other

## 2023-08-10 ENCOUNTER — Ambulatory Visit (INDEPENDENT_AMBULATORY_CARE_PROVIDER_SITE_OTHER): Payer: Medicare Other | Admitting: Nurse Practitioner

## 2023-08-10 ENCOUNTER — Encounter: Payer: Self-pay | Admitting: Nurse Practitioner

## 2023-08-10 VITALS — BP 129/61 | HR 82 | Temp 98.3°F | Ht 62.0 in | Wt 100.0 lb

## 2023-08-10 DIAGNOSIS — E039 Hypothyroidism, unspecified: Secondary | ICD-10-CM | POA: Diagnosis not present

## 2023-08-10 DIAGNOSIS — F419 Anxiety disorder, unspecified: Secondary | ICD-10-CM | POA: Diagnosis not present

## 2023-08-10 DIAGNOSIS — F3342 Major depressive disorder, recurrent, in full remission: Secondary | ICD-10-CM

## 2023-08-10 DIAGNOSIS — M349 Systemic sclerosis, unspecified: Secondary | ICD-10-CM | POA: Diagnosis not present

## 2023-08-10 DIAGNOSIS — K219 Gastro-esophageal reflux disease without esophagitis: Secondary | ICD-10-CM | POA: Diagnosis not present

## 2023-08-10 DIAGNOSIS — I442 Atrioventricular block, complete: Secondary | ICD-10-CM

## 2023-08-10 DIAGNOSIS — D500 Iron deficiency anemia secondary to blood loss (chronic): Secondary | ICD-10-CM

## 2023-08-10 DIAGNOSIS — I272 Pulmonary hypertension, unspecified: Secondary | ICD-10-CM | POA: Diagnosis not present

## 2023-08-10 DIAGNOSIS — I5032 Chronic diastolic (congestive) heart failure: Secondary | ICD-10-CM

## 2023-08-10 DIAGNOSIS — E785 Hyperlipidemia, unspecified: Secondary | ICD-10-CM | POA: Diagnosis not present

## 2023-08-10 DIAGNOSIS — K582 Mixed irritable bowel syndrome: Secondary | ICD-10-CM

## 2023-08-10 DIAGNOSIS — I7300 Raynaud's syndrome without gangrene: Secondary | ICD-10-CM | POA: Diagnosis not present

## 2023-08-10 DIAGNOSIS — E875 Hyperkalemia: Secondary | ICD-10-CM

## 2023-08-10 MED ORDER — EZETIMIBE 10 MG PO TABS: 10.0000 mg | ORAL_TABLET | Freq: Every day | ORAL | 1 refills | Status: DC

## 2023-08-10 MED ORDER — CLONAZEPAM 0.5 MG PO TABS: 0.5000 mg | ORAL_TABLET | Freq: Two times a day (BID) | ORAL | 1 refills | Status: DC | PRN

## 2023-08-10 MED ORDER — FLUOXETINE HCL 40 MG PO CAPS: 40.0000 mg | ORAL_CAPSULE | Freq: Every day | ORAL | 1 refills | Status: DC

## 2023-08-10 MED ORDER — OMEPRAZOLE 20 MG PO CPDR: DELAYED_RELEASE_CAPSULE | ORAL | 1 refills | Status: DC

## 2023-08-10 MED ORDER — FUROSEMIDE 20 MG PO TABS: 20.0000 mg | ORAL_TABLET | Freq: Every day | ORAL | 0 refills | Status: DC | PRN

## 2023-08-10 MED ORDER — NIFEDIPINE ER OSMOTIC RELEASE 60 MG PO TB24: 60.0000 mg | ORAL_TABLET | Freq: Every day | ORAL | 1 refills | Status: DC

## 2023-08-10 MED ORDER — SILDENAFIL CITRATE 20 MG PO TABS: 20.0000 mg | ORAL_TABLET | Freq: Two times a day (BID) | ORAL | 5 refills | Status: DC

## 2023-08-10 MED ORDER — SYNTHROID 75 MCG PO TABS: ORAL_TABLET | ORAL | 1 refills | Status: DC

## 2023-08-10 NOTE — Patient Instructions (Signed)
 Raynaud's Phenomenon  Raynaud's phenomenon is a condition that affects the blood vessels (arteries) that carry blood to the fingers and toes. The arteries that supply blood to the ears, lips, nipples, or the tip of the nose might also be affected. Raynaud's phenomenon causes the arteries to become narrow temporarily (spasm). As a result, the flow of blood to the affected areas is temporarily decreased. This usually occurs in response to cold temperatures or stress. During an attack, the skin in the affected areas turns white, then blue, and finally red. A person may also feel tingling or numbness in those areas. Attacks usually last for only a brief period, and then the blood flow to the area returns to normal. In most cases, Raynaud's phenomenon does not cause serious health problems. What are the causes? In many cases, the cause of this condition is not known. The condition may occur on its own (primary Raynaud's phenomenon) or may be associated with other diseases or factors (secondary Raynaud's phenomenon). Possible causes may include: Diseases or medical conditions that damage the arteries. Injuries and repetitive actions that hurt the hands or feet. Being exposed to certain chemicals. Taking medicines that narrow the arteries. Other medical conditions, such as lupus, scleroderma, rheumatoid arthritis, thyroid problems, blood disorders, Sjogren syndrome, or atherosclerosis. What increases the risk? The following factors may make you more likely to develop this condition: Being 18-78 years old. Being female. Having a family history of Raynaud's phenomenon. Living in a cold climate. Smoking. What are the signs or symptoms? Symptoms of this condition usually occur when you are exposed to cold temperatures or when you have emotional stress. The symptoms may last for a few minutes or up to several hours. They usually affect your fingers but may also affect your toes, nipples, lips, ears, or the  tip of your nose. Symptoms may include: Changes in skin color. The skin in the affected areas will turn pale or white. The skin may then change from white to bluish to red as normal blood flow returns to the area. Numbness, tingling, or pain in the affected areas. In severe cases, symptoms may include: Skin sores. Tissues decaying and dying (gangrene). How is this diagnosed? This condition may be diagnosed based on: Your symptoms and medical history. A physical exam. During the exam, you may be asked to put your hands in cold water to check for a reaction to cold temperature. Tests, such as: Blood tests to check for other diseases or conditions. A test to check the movement of blood through your arteries and veins (vascular ultrasound). A test in which the skin at the base of your fingernail is examined under a microscope (nailfold capillaroscopy). How is this treated? During an episode, you can take actions to help symptoms go away faster. Options include moving your arms around in a windmill pattern, warming your fingers under warm water, or placing your fingers in a warm body fold, such as your armpit. Long-term treatment for this condition often involves making lifestyle changes and taking steps to control your exposure to cold temperature. For more severe cases, medicine (calcium channel blockers) may be used to improve blood circulation. Follow these instructions at home: Avoiding cold temperatures Take these steps to avoid exposure to cold: If possible, stay indoors during cold weather. When you go outside during cold weather, dress in layers and wear mittens, a hat, a scarf, and warm footwear. Wear mittens or gloves when handling ice or frozen food. Use holders for glasses or cans containing  cold drinks. Let warm water run for a while before taking a shower or bath. Warm up the car before driving in cold weather. Lifestyle If possible, avoid stressful and emotional situations. Try  to find ways to manage your stress, such as: Exercise. Yoga. Meditation. Biofeedback. Do not use any products that contain nicotine or tobacco. These products include cigarettes, chewing tobacco, and vaping devices, such as e-cigarettes. If you need help quitting, ask your health care provider. Avoid secondhand smoke. Limit your use of caffeine. Switch to decaffeinated coffee, tea, and soda. Avoid chocolate. Avoid vibrating tools and machinery. General instructions Protect your hands and feet from injuries, cuts, or bruises. Avoid wearing tight rings or wristbands. Wear loose fitting socks and comfortable, roomy shoes. Take over-the-counter and prescription medicines only as told by your health care provider. Where to find support Raynaud's Association: www.raynauds.org Where to find more information [employer] of Arthritis and Musculoskeletal and Skin Diseases: www.niams.http://www.myers.net/ Contact a health care provider if: Your discomfort becomes worse despite lifestyle changes. You develop sores on your fingers or toes that do not heal. You have breaks in the skin on your fingers or toes. You have a fever. You have pain or swelling in your joints. You have a rash. Your symptoms occur on only one side of your body. Get help right away if: Your fingers or toes turn black. You have severe pain in the affected areas. These symptoms may represent a serious problem that is an emergency. Do not wait to see if the symptoms will go away. Get medical help right away. Call your local emergency services (911 in the [place] Do not drive yourself to the hospital. Summary Raynaud's phenomenon is a condition that affects the arteries that carry blood to the fingers, toes, ears, lips, nipples, or the tip of the nose. In many cases, the cause of this condition is not known. Symptoms of this condition include changes in skin color along with numbness and tingling in the affected area. Treatment for  this condition includes lifestyle changes and reducing exposure to cold temperatures. Medicines may be used for severe cases of the condition. Contact your health care provider if your condition worsens despite treatment. This information is not intended to replace advice given to you by your health care provider. Make sure you discuss any questions you have with your health care provider. Document Revised: 08/28/2020 Document Reviewed: 08/28/2020 Elsevier Patient Education  2024 [employer].

## 2023-08-10 NOTE — Progress Notes (Signed)
Subjective:    Patient ID: Haley Trevino, female    DOB: 26-Dec-1940, 83 y.o.   MRN: [id number]   Chief Complaint: medical management of chronic issues     HPI:  Haley Trevino is a 83 y.o. who identifies as a female who was assigned female at birth.   Social history: Lives with: husband Work history: retired   Water engineer in today for follow up of the following chronic medical issues:  1. Primary hypertension No c/o chest pain or headache. Has SOB all the time. BP Readings from Last 3 Encounters:  08/03/23 126/78  07/31/23 (!) 127/57  07/23/23 137/64     2. Acute on chronic diastolic heart failure (HCC) 3. Heart block AV complete (HCC) 4. Pulmonary HTN (HCC) Has seen cardiology several times in the last few weeks. She actually had to have heart cath a few weeks ago. Pacemaker is good. Has follow u with cardiology this week  5. [name] without gangrene Her raynauds is worsening but always does better in the summer time.  6. Gastroesophageal reflux disease without esophagitis Is on omperazole and is doing well.  7. Irritable bowel syndrome with both constipation and diarrhea No recent flare ups  8. Acquired hypothyroidism No issues that she is aware of. Lab Results  Component Value Date   TSH 1.460 02/13/2022     9. Hyperlipidemia with target LDL less than 100 Has a very poor appetite and has actually been losing weight. Lab Results  Component Value Date   CHOL 192 08/18/2022   HDL 49 08/18/2022   LDLCALC 116 (H) 08/18/2022   TRIG 153 (H) 08/18/2022   CHOLHDL 3.9 08/18/2022     10. Hyperkalemia Denies any muscle cramps Lab Results  Component Value Date   K 3.8 08/02/2023     11. Iron deficiency anemia due to chronic blood loss Sees hematology frequently and has to have iron infusions. Lab Results  Component Value Date   HGB 8.6 (L) 08/02/2023     12. Anxiety Is on klonopin and is doing okay    07/20/2023   10:08 AM  05/29/2023   11:38 AM 02/09/2023    2:04 PM 08/18/2022    1:54 PM  GAD 7 : Generalized Anxiety Score  Nervous, Anxious, on Edge 0 0 0 0  Control/stop worrying 0 0 0 0  Worry too much - different things 0 0 0 0  Trouble relaxing 0 0 0 0  Restless 0 0 0 0  Easily annoyed or irritable 0 0 0 0  Afraid - awful might happen 0 0 0 0  Total GAD 7 Score 0 0 0 0  Anxiety Difficulty  Not difficult at all Not difficult at all Not difficult at all      13. Recurrent major depressive disorder, in full remission (HCC) Has been on prozac for several years. Helps some.     07/20/2023   10:08 AM 06/08/2023    8:08 AM 05/29/2023   11:38 AM  Depression screen PHQ 2/9  Decreased Interest 0 0 0  Down, Depressed, Hopeless 0 0 0  PHQ - 2 Score 0 0 0  Altered sleeping 0 0 0  Tired, decreased energy 0 0 0  Change in appetite 0 0 0  Feeling bad or failure about yourself  0 0 0  Trouble concentrating 0 0 0  Moving slowly or fidgety/restless 0 0 0  Suicidal thoughts 0 0 0  PHQ-9 Score 0 0 0  Difficult  doing work/chores Not difficult at all Not difficult at all Not difficult at all     14. SCLERODERMA Sees rheumatology but is on no specific meds. DR. Anson [place] wants to send her to a scleraderma specialist. Se is also being sent to pain management. Has constant pain bil chest wall and axillia   New complaints: Had to go to urgent care last week and was dx with pulmonary edema. Was given lasix to take for 5 days which helped.   Allergies  Allergen Reactions   Bactrim [Sulfamethoxazole-Trimethoprim] Nausea And Vomiting   Dilaudid [Hydromorphone Hcl] Anaphylaxis   Eliquis [Apixaban] Other (See Comments)    Caused bleed   Acyclovir And Related Nausea And Vomiting   Crestor [Rosuvastatin Calcium] Other (See Comments)    Muscle pain   Gabapentin Nausea And Vomiting and Swelling    Swelling in feet and in legs   Aleve [Naproxen Sodium] Rash   Lyrica [Pregabalin] Other (See Comments)    Swelling in  feet, and in legs   Outpatient Encounter Medications as of 08/10/2023  Medication Sig   acetaminophen (TYLENOL) 500 MG tablet Take 1,000 mg by mouth at bedtime.   acidophilus (RISAQUAD) CAPS capsule Take 2 capsules by mouth daily.   aspirin EC 81 MG tablet Take 81 mg by mouth daily. Swallow whole.   clonazePAM (KLONOPIN) 0.5 MG tablet Take 1 tablet (0.5 mg total) by mouth 2 (two) times daily as needed for anxiety.   ezetimibe (ZETIA) 10 MG tablet Take 1 tablet (10 mg total) by mouth daily.   FLUoxetine (PROZAC) 40 MG capsule Take 1 capsule (40 mg total) by mouth daily.   fluticasone (FLONASE) 50 MCG/ACT nasal spray PLACE 2 SPRAYS INTO BOTH NOSTRILS DAILY (Patient taking differently: Place 2 sprays into both nostrils as needed.)   furosemide (LASIX) 20 MG tablet Take 1 tablet (20 mg total) by mouth daily.   NIFEdipine (PROCARDIA XL/NIFEDICAL XL) 60 MG 24 hr tablet Take 60 mg by mouth daily.   NONFORMULARY OR COMPOUNDED ITEM Take 1 mg by mouth at bedtime. Low dose naltrexone (Patient taking differently: Take 1 mg by mouth at bedtime. Low dose naltrexone Anti-inflammatory with strict diet to avoid inflammation)   omeprazole (PRILOSEC) 20 MG capsule TAKE 1 TABLET DAILY   PROLIA 60 MG/ML SOSY injection Inject 60 mg into the skin every 6 (six) months.   RESTASIS 0.05 % ophthalmic emulsion Place 1 drop into both eyes 2 (two) times daily.   sildenafil (REVATIO) 20 MG tablet Take 1 tablet (20 mg total) by mouth 2 (two) times daily.   SYNTHROID 75 MCG tablet TAKE (1) TABLET DAILY BE- FORE BREAKFAST.   No facility-administered encounter medications on file as of 08/10/2023.    Past Surgical History:  Procedure Laterality Date   BREAST ENHANCEMENT SURGERY  1975   BREAST IMPLANT REMOVAL Bilateral 04/23/2016   Procedure: REMOVALBILATERAL BREAST IMPLANTS;  Surgeon: Peggye Form, DO;  Location: [facility];  Service: Plastics;  Laterality: Bilateral;   CHOLECYSTECTOMY  1973    ESOPHAGOGASTRODUODENOSCOPY N/A 10/25/2020   Procedure: ESOPHAGOGASTRODUODENOSCOPY (EGD);  Surgeon: Vida Rigger, MD;  Location: [facility];  Service: Endoscopy;  Laterality: N/A;  enteroscopy   HAND SURGERY  08/2012; 11/2012   HOT HEMOSTASIS N/A 10/25/2020   Procedure: HOT HEMOSTASIS (ARGON PLASMA COAGULATION/BICAP);  Surgeon: Vida Rigger, MD;  Location: [facility];  Service: Endoscopy;  Laterality: N/A;   INTRAOPERATIVE TRANSTHORACIC ECHOCARDIOGRAM N/A 05/13/2022   Procedure: INTRAOPERATIVE TRANSTHORACIC ECHOCARDIOGRAM;  Surgeon: Kathleene Hazel, MD;  Location: [facility];  Service: Open Heart Surgery;  Laterality: N/A;   INTRAOPERATIVE TRANSTHORACIC ECHOCARDIOGRAM N/A 05/20/2022   Procedure: INTRAOPERATIVE TRANSTHORACIC ECHOCARDIOGRAM;  Surgeon: Orbie Pyo, MD;  Location: [facility];  Service: Open Heart Surgery;  Laterality: N/A;   IR RADIOLOGIST EVAL & MGMT  07/27/2017   IR SACROPLASTY BILATERAL  07/31/2017   IR VERTEBROPLASTY CERV/THOR BX INC UNI/BIL INC/INJECT/IMAGING  03/13/2020   IR VERTEBROPLASTY EA ADDL (T&LS) BX INC UNI/BIL INC INJECT/IMAGING  03/14/2020   MELANOMA EXCISION     at 31 yrs of age   ORIF HIP FRACTURE Right 02/22/2014   Procedure: OPEN REDUCTION INTERNAL FIXATION RIGHT HIP;  Surgeon: Darreld Mclean, MD;  Location: [facility];  Service: Orthopedics;  Laterality: Right;   PACEMAKER IMPLANT N/A 05/27/2022   Procedure: PACEMAKER IMPLANT;  Surgeon: Lanier Prude, MD;  Location: [facility];  Service: Cardiovascular;  Laterality: N/A;   RIGHT HEART CATH N/A 01/16/2023   Procedure: RIGHT HEART CATH;  Surgeon: Dorthula Nettles, DO;  Location: [facility];  Service: Cardiovascular;  Laterality: N/A;   RIGHT HEART CATH AND CORONARY ANGIOGRAPHY N/A 03/28/2022   Procedure: RIGHT HEART CATH AND CORONARY ANGIOGRAPHY;  Surgeon: Orbie Pyo, MD;  Location: [facility];  Service: Cardiovascular;  Laterality: N/A;   TOTAL ABDOMINAL HYSTERECTOMY  1974    TRANSCATHETER AORTIC VALVE REPLACEMENT, TRANSFEMORAL Left 05/13/2022   Procedure: Transcatheter Aortic Valve Replacement, Transfemoral;  Surgeon: Kathleene Hazel, MD;  Location: [facility];  Service: Open Heart Surgery;  Laterality: Left;   TRANSCATHETER AORTIC VALVE REPLACEMENT, TRANSFEMORAL Left 05/20/2022   Procedure: Transcatheter Aortic Valve Replacement, Transfemoral using an Edwards SAPIEN 3 Ultra 23 MM Heart Valve;  Surgeon: Orbie Pyo, MD;  Location: [facility];  Service: Open Heart Surgery;  Laterality: Left;  Left Transfemoral    Family History  Problem Relation Age of Onset   Pancreatic cancer Father    Raynaud syndrome Father    Cancer Father        pancreatic   Rashes / Skin problems Daughter        possibly scleraderma   Hip fracture Mother    Mental illness Mother        attempted suicide at 33 yo      Controlled substance contract: n/a     Review of Systems  Constitutional:  Negative for diaphoresis.  Eyes:  Negative for pain.  Respiratory:  Negative for shortness of breath.   Cardiovascular:  Negative for chest pain, palpitations and leg swelling.  Gastrointestinal:  Negative for abdominal pain.  Endocrine: Negative for polydipsia.  Skin:  Negative for rash.  Neurological:  Negative for dizziness, weakness and headaches.  Hematological:  Does not bruise/bleed easily.  All other systems reviewed and are negative.      Objective:   Physical Exam Vitals and nursing note reviewed.  Constitutional:      General: She is not in acute distress.    Appearance: Normal appearance. She is well-developed.  HENT:     Head: Normocephalic.     Right Ear: Tympanic membrane normal.     Left Ear: Tympanic membrane normal.     Nose: Nose normal.     Mouth/Throat:     Mouth: Mucous membranes are moist.  Eyes:     Pupils: Pupils are equal, round, and reactive to light.  Neck:     Vascular: No carotid bruit or JVD.  Cardiovascular:  Rate and  Rhythm: Normal rate and regular rhythm.     Heart sounds: Murmur (4/6 systolic) heard.  Pulmonary:     Effort: Pulmonary effort is normal. No respiratory distress.     Breath sounds: Normal breath sounds. No wheezing or rales.  Chest:     Chest wall: No tenderness.  Abdominal:     General: Bowel sounds are normal. There is no distension or abdominal bruit.     Palpations: Abdomen is soft. There is no hepatomegaly, splenomegaly, mass or pulsatile mass.     Tenderness: There is no abdominal tenderness.  Musculoskeletal:        General: Normal range of motion.     Cervical back: Normal range of motion and neck supple.  Lymphadenopathy:     Cervical: No cervical adenopathy.  Skin:    General: Skin is warm and dry.     Comments: Paleness and cool to touch finger tips bil.  Neurological:     Mental Status: She is alert and oriented to person, place, and time.     Deep Tendon Reflexes: Reflexes are normal and symmetric.  Psychiatric:        Behavior: Behavior normal.        Thought Content: Thought content normal.        Judgment: Judgment normal.     BP 129/61   Pulse 82   Temp 98.3 F (36.8 C) (Temporal)   Ht 5\' 2"  (1.575 m)   Wt 100 lb (45.4 kg)   SpO2 95%   BMI 18.29 kg/m         Assessment & Plan:   Haley Trevino comes in today with chief complaint of No chief complaint on file.   Diagnosis and orders addressed:  1. Primary hypertension Low sodium diet  2. Acute on chronic diastolic heart failure (HCC) 3. Heart block AV complete (HCC) 4. Pulmonary HTN (HCC) Keep follow up with cardiology Lasix as needed if develop SOB-  Daily weights- if gain 2 lbs in 1 day take lasix  5. [name] without gangrene Keep follow  up with rheumatology  6. Gastroesophageal reflux disease without esophagitis Avoid spicy foods Do not eat 2 hours prior to bedtime  - omeprazole (PRILOSEC) 20 MG capsule; TAKE 1 TABLET DAILY  Dispense: 90 capsule; Refill: 1  7.  Irritable bowel syndrome with both constipation and diarrhea  8. Acquired hypothyroidism Labs pending - SYNTHROID 75 MCG tablet; TAKE (1) TABLET DAILY BE- FORE BREAKFAST.  Dispense: 90 tablet; Refill: 1  9. Hyperlipidemia with target LDL less than 100 Low fat diet - ezetimibe (ZETIA) 10 MG tablet; Take 1 tablet (10 mg total) by mouth daily.  Dispense: 90 tablet; Refill: 1  10. Hyperkalemia Labs pending  11. Iron deficiency anemia due to chronic blood loss Labs pending  12. Anxiety Stress management - ToxASSURE Select 13 (MW), Urine - clonazePAM (KLONOPIN) 0.5 MG tablet; Take 1 tablet (0.5 mg total) by mouth 2 (two) times daily as needed for anxiety.  Dispense: 180 tablet; Refill: 1  13. Recurrent major depressive disorder, in full remission (HCC) - FLUoxetine (PROZAC) 40 MG capsule; Take 1 capsule (40 mg total) by mouth daily.  Dispense: 90 capsule; Refill: 1  14. SCLERODERMA Keep follow up with rheumatology Will try some flexeril and see if helps.   Labs pending Health Maintenance reviewed Diet and exercise encouraged  Follow up plan: 6 months   Mary-Margaret Daphine Deutscher, FNP

## 2023-08-11 LAB — CBC WITH DIFFERENTIAL/PLATELET
Basophils Absolute: 0.1 10*3/uL (ref 0.0–0.2)
Basos: 1 %
EOS (ABSOLUTE): 0.1 10*3/uL (ref 0.0–0.4)
Eos: 2 %
Hematocrit: 30.8 % — ABNORMAL LOW (ref 34.0–46.6)
Hemoglobin: 9.5 g/dL — ABNORMAL LOW (ref 11.1–15.9)
Immature Grans (Abs): 0 10*3/uL (ref 0.0–0.1)
Immature Granulocytes: 1 %
Lymphocytes Absolute: 1.2 10*3/uL (ref 0.7–3.1)
Lymphs: 14 %
MCH: 28.4 pg (ref 26.6–33.0)
MCHC: 30.8 g/dL — ABNORMAL LOW (ref 31.5–35.7)
MCV: 92 fL (ref 79–97)
Monocytes Absolute: 0.5 10*3/uL (ref 0.1–0.9)
Monocytes: 6 %
Neutrophils Absolute: 6.7 10*3/uL (ref 1.4–7.0)
Neutrophils: 76 %
Platelets: 323 10*3/uL (ref 150–450)
RBC: 3.35 x10E6/uL — ABNORMAL LOW (ref 3.77–5.28)
RDW: 15.7 % — ABNORMAL HIGH (ref 11.7–15.4)
WBC: 8.7 10*3/uL (ref 3.4–10.8)

## 2023-08-11 LAB — THYROID PANEL WITH TSH
Free Thyroxine Index: 2.7 (ref 1.2–4.9)
T3 Uptake Ratio: 26 % (ref 24–39)
T4, Total: 10.3 ug/dL (ref 4.5–12.0)
TSH: 1.06 u[IU]/mL (ref 0.450–4.500)

## 2023-08-11 LAB — CMP14+EGFR
ALT: 7 [IU]/L (ref 0–32)
AST: 15 [IU]/L (ref 0–40)
Albumin: 4.2 g/dL (ref 3.7–4.7)
Alkaline Phosphatase: 83 [IU]/L (ref 44–121)
BUN/Creatinine Ratio: 13 (ref 12–28)
BUN: 11 mg/dL (ref 8–27)
Bilirubin Total: <0.2 mg/dL (ref 0.0–1.2)
CO2: 21 mmol/L (ref 20–29)
Calcium: 8.7 mg/dL (ref 8.7–10.3)
Chloride: 104 mmol/L (ref 96–106)
Creatinine, Ser: 0.86 mg/dL (ref 0.57–1.00)
Globulin, Total: 2.6 g/dL (ref 1.5–4.5)
Glucose: 98 mg/dL (ref 70–99)
Potassium: 3.7 mmol/L (ref 3.5–5.2)
Sodium: 140 mmol/L (ref 134–144)
Total Protein: 6.8 g/dL (ref 6.0–8.5)
eGFR: 67 mL/min/{1.73_m2} (ref >59)

## 2023-08-11 LAB — LIPID PANEL
Chol/HDL Ratio: 3.6 {ratio} (ref 0.0–4.4)
Cholesterol, Total: 186 mg/dL (ref 100–199)
HDL: 52 mg/dL (ref >39)
LDL Chol Calc (NIH): 102 mg/dL — ABNORMAL HIGH (ref 0–99)
Triglycerides: 183 mg/dL — ABNORMAL HIGH (ref 0–149)
VLDL Cholesterol Cal: 32 mg/dL (ref 5–40)

## 2023-08-12 NOTE — Progress Notes (Signed)
 Cardiology Office Note:  .   Date:  08/13/2023  ID:  Haley Trevino, DOB May 25, 1941, MRN [id number] PCP: Gladis Mustard, FNP  [facility] Providers Cardiologist:  Lynwood Schilling, MD  History of Present Illness: .   Haley Trevino is a 83 y.o. female hx of scleroderma and Raynaud's (on chronic Revatio  therapy), HLD, iron  deficiency anemia on iron  infusions, HFpEF, atrial flutter not on OAC due to GI bleeding, CHB s/p PPM (05/2022), moderate mitral stenosis due to MAC and severe AS s/p TAVR (05/13/22).   She has had chronic shortness of breath and diffuse body pain, including chest pain that is felt to be related to her chronic rheumatologic conditions.  She is followed by Dr. Darlean pulmonology.  RHC 01/16/23 showed normal pre and post capillary filling pressures, mildly elevated PVR & PA mean and normal cardiac output and index.    Last seen by Izetta Hummer, PA, in the heart valve clinic on 05/13/2023.  Pacemaker was interrogated at that time as she did have a fall which was believed to be mechanical, with interrogation that did not reveal any cardiac arrhythmias which may be etiology of her fall.  She comes today without any cardiac complaints.  She continues to get iron  infusions due to iron  deficiency anemia.  She denies chest pain, shortness of breath, she has chronic dizziness and hearing deficiency.  ROS: As above otherwise negative  Studies Reviewed: .    Echocardiogram 05/13/2023 1. Left ventricular ejection fraction, by estimation, is 60 to 65%. The  left ventricle has normal function. The left ventricle has no regional  wall motion abnormalities. Left ventricular diastolic parameters are  indeterminate.   2. Pacing leads in RA/RV . Right ventricular systolic function is normal.  The right ventricular size is normal. There is mildly elevated pulmonary  artery systolic pressure.   3. Left atrial size was moderately dilated.   4. Mean gradient 9 peak 18 mmHg at  HR 80 bpm with MVA 1.6 cm2. The mitral  valve is abnormal. Trivial mitral valve regurgitation. Moderate mitral  stenosis. Severe mitral annular calcification.   5. Post TAVR with 23 mm Sapien 3 valve no PVL mean gradient 14 peak 27  mmHg AVA 2 cm2 Gradients similar to TTE done 06/20/22 . The aortic valve  has been repaired/replaced. Aortic valve regurgitation is not visualized.  No aortic stenosis is present.  There is a 23 mm Edwards Sapien prosthetic (TAVR) valve present in the  aortic position. Procedure Date: 05/20/2022.   6. The inferior vena cava is normal in size with greater than 50%  respiratory variability, suggesting right atrial pressure of 3 mmHg.      Physical Exam:   VS:  BP (!) 118/30 (BP Location: Right Arm, Patient Position: Sitting, Cuff Size: Normal)   Pulse 72   Ht 5' 2 (1.575 m)   Wt 101 lb (45.8 kg)   SpO2 93%   BMI 18.47 kg/m    Wt Readings from Last 3 Encounters:  08/13/23 101 lb (45.8 kg)  08/10/23 100 lb (45.4 kg)  08/02/23 101 lb (45.8 kg)    GEN: Well nourished, well developed in no acute distress NECK: No JVD; No carotid bruits CARDIAC: RRR, 2/6 systolic murmur right sternal border, no rubs, gallops RESPIRATORY:  Clear to auscultation without rales, wheezing or rhonchi  ABDOMEN: Soft, non-tender, non-distended EXTREMITIES:  No edema; No deformity   ASSESSMENT AND PLAN: .    History of aortic stenosis: Status post TAVR on  05/13/2022.  Doing well has been seen by heart valve clinic.  She no changes to her medication regimen as she is followed closely.  She feels well.  2.  HFpEF: No evidence of volume overload.  She has chronic shortness of breath not related to fluid.  The patient has chronic anemia from iron  deficiency which may be contributing.  3.  Pacemaker in situ: Recently interrogated at heart valve clinic.  Did not reveal any cardiac arrhythmias.  Continue remote monitoring per protocol.  4.  Iron  deficiency anemia: Continues to have iron   infusions through oncology hematology.  Defer to hematology for management.         Signed, Lamarr HERO. [name], ANP, AACC

## 2023-08-13 ENCOUNTER — Encounter: Payer: Self-pay | Admitting: Adult Health

## 2023-08-13 ENCOUNTER — Ambulatory Visit: Payer: Medicare Other | Attending: Adult Health | Admitting: Adult Health

## 2023-08-13 VITALS — BP 118/30 | HR 72 | Ht 62.0 in | Wt 101.0 lb

## 2023-08-13 DIAGNOSIS — M349 Systemic sclerosis, unspecified: Secondary | ICD-10-CM | POA: Diagnosis not present

## 2023-08-13 DIAGNOSIS — Z950 Presence of cardiac pacemaker: Secondary | ICD-10-CM

## 2023-08-13 DIAGNOSIS — I5032 Chronic diastolic (congestive) heart failure: Secondary | ICD-10-CM | POA: Diagnosis not present

## 2023-08-13 DIAGNOSIS — D500 Iron deficiency anemia secondary to blood loss (chronic): Secondary | ICD-10-CM | POA: Diagnosis not present

## 2023-08-13 DIAGNOSIS — Z952 Presence of prosthetic heart valve: Secondary | ICD-10-CM | POA: Diagnosis not present

## 2023-08-13 NOTE — Patient Instructions (Signed)
 Medication Instructions:  NO CHANGES *If you need a refill on your cardiac medications before your next appointment, please call your pharmacy*   Lab Work: NO LABS If you have labs (blood work) drawn today and your tests are completely normal, you will receive your results only by: MyChart Message (if you have MyChart) OR A paper copy in the mail If you have any lab test that is abnormal or we need to change your treatment, we will call you to review the results.   Testing/Procedures: NO TESTING   Follow-Up: At [facility], you and your health needs are our priority.  As part of our continuing mission to provide you with exceptional heart care, we have created designated Provider Care Teams.  These Care Teams include your primary Cardiologist (physician) and Advanced Practice Providers (APPs -  Physician Assistants and Nurse Practitioners) who all work together to provide you with the care you need, when you need it.  Your next appointment:   6 month(s)  Provider:   Rollene Rotunda, MD   Other Instructions

## 2023-08-14 ENCOUNTER — Inpatient Hospital Stay: Payer: Medicare Other

## 2023-08-14 ENCOUNTER — Inpatient Hospital Stay: Payer: Medicare Other | Attending: Hematology

## 2023-08-14 VITALS — BP 111/51 | HR 66 | Temp 96.9°F | Resp 18

## 2023-08-14 DIAGNOSIS — D509 Iron deficiency anemia, unspecified: Secondary | ICD-10-CM | POA: Insufficient documentation

## 2023-08-14 DIAGNOSIS — D500 Iron deficiency anemia secondary to blood loss (chronic): Secondary | ICD-10-CM

## 2023-08-14 LAB — CBC WITH DIFFERENTIAL/PLATELET
Abs Immature Granulocytes: 0.01 10*3/uL (ref 0.00–0.07)
Basophils Absolute: 0 10*3/uL (ref 0.0–0.1)
Basophils Relative: 1 %
Eosinophils Absolute: 0.2 10*3/uL (ref 0.0–0.5)
Eosinophils Relative: 4 %
HCT: 32.2 % — ABNORMAL LOW (ref 36.0–46.0)
Hemoglobin: 9.8 g/dL — ABNORMAL LOW (ref 12.0–15.0)
Immature Granulocytes: 0 %
Lymphocytes Relative: 19 %
Lymphs Abs: 0.9 10*3/uL (ref 0.7–4.0)
MCH: 27.9 pg (ref 26.0–34.0)
MCHC: 30.4 g/dL (ref 30.0–36.0)
MCV: 91.7 fL (ref 80.0–100.0)
Monocytes Absolute: 0.4 10*3/uL (ref 0.1–1.0)
Monocytes Relative: 7 %
Neutro Abs: 3.4 10*3/uL (ref 1.7–7.7)
Neutrophils Relative %: 69 %
Platelets: 294 10*3/uL (ref 150–400)
RBC: 3.51 MIL/uL — ABNORMAL LOW (ref 3.87–5.11)
RDW: 18.1 % — ABNORMAL HIGH (ref 11.5–15.5)
WBC: 4.9 10*3/uL (ref 4.0–10.5)
nRBC: 0 % (ref 0.0–0.2)

## 2023-08-14 LAB — SAMPLE TO BLOOD BANK

## 2023-08-14 MED ORDER — SODIUM CHLORIDE 0.9 % IV SOLN: INTRAVENOUS | Status: DC

## 2023-08-14 MED ORDER — FERUMOXYTOL INJECTION 510 MG/17 ML
510.0000 mg | Freq: Once | INTRAVENOUS | Status: AC
  Administered 2023-08-14: 510 mg via INTRAVENOUS
  Filled 2023-08-14: qty 510

## 2023-08-14 NOTE — Patient Instructions (Addendum)
 [facility] - A DEPT OF [facility]  Discharge Instructions: Thank you for choosing [facility] to provide your oncology and hematology care.  If you have a lab appointment with the Cancer Center - please note that after April 8th, 2024, all labs will be drawn in the cancer center.  You do not have to check in or register with the main entrance as you have in the past but will complete your check-in in the cancer center.  Wear comfortable clothing and clothing appropriate for easy access to any Portacath or PICC line.   We strive to give you quality time with your provider. You may need to reschedule your appointment if you arrive late (15 or more minutes).  Arriving late affects you and other patients whose appointments are after yours.  Also, if you miss three or more appointments without notifying the office, you may be dismissed from the clinic at the provider's discretion.      For prescription refill requests, have your pharmacy contact our office and allow 72 hours for refills to be completed.    Today you received the following chemotherapy and/or immunotherapy agents Feraheme.       To help prevent nausea and vomiting after your treatment, we encourage you to take your nausea medication as directed.  BELOW ARE SYMPTOMS THAT SHOULD BE REPORTED IMMEDIATELY: *FEVER GREATER THAN 100.4 F (38 C) OR HIGHER *CHILLS OR SWEATING *NAUSEA AND VOMITING THAT IS NOT CONTROLLED WITH YOUR NAUSEA MEDICATION *UNUSUAL SHORTNESS OF BREATH *UNUSUAL BRUISING OR BLEEDING *URINARY PROBLEMS (pain or burning when urinating, or frequent urination) *BOWEL PROBLEMS (unusual diarrhea, constipation, pain near the anus) TENDERNESS IN MOUTH AND THROAT WITH OR WITHOUT PRESENCE OF ULCERS (sore throat, sores in mouth, or a toothache) UNUSUAL RASH, SWELLING OR PAIN  UNUSUAL VAGINAL DISCHARGE OR ITCHING   Items with * indicate a potential emergency and should be followed  up as soon as possible or go to the Emergency Department if any problems should occur.  Please show the CHEMOTHERAPY ALERT CARD or IMMUNOTHERAPY ALERT CARD at check-in to the Emergency Department and triage nurse.  Should you have questions after your visit or need to cancel or reschedule your appointment, please contact [facility] [place] - A DEPT OF [facility] [phone]  and follow the prompts.  Office hours are 8:00 a.m. to 4:30 p.m. Monday - Friday. Please note that voicemails left after 4:00 p.m. may not be returned until the following business day.  We are closed weekends and major holidays. You have access to a nurse at all times for urgent questions. Please call the main number to the clinic [phone] and follow the prompts.  For any non-urgent questions, you may also contact your provider using MyChart. We now offer e-Visits for anyone 65 and older to request care online for non-urgent symptoms. For details visit [email or web address].   Also download the MyChart app! Go to the app store, search MyChart, open the app, select , and log in with your MyChart username and password.  Ferumoxytol  Injection What is this medication? FERUMOXYTOL  (FER ue MOX i tol) treats low levels of iron  in your body (iron  deficiency anemia). Iron  is a mineral that plays an important role in making red blood cells, which carry oxygen from your lungs to the rest of your body. This medicine may be used for other purposes; ask your health care provider or pharmacist if you have  questions. COMMON BRAND NAME(S): Feraheme What should I tell my care team before I take this medication? They need to know if you have any of these conditions: Anemia not caused by low iron  levels High levels of iron  in the blood Magnetic resonance imaging (MRI) test scheduled An unusual or allergic reaction to iron , other medications, foods, dyes, or preservatives Pregnant or trying to get  pregnant Breastfeeding How should I use this medication? This medication is injected into a vein. It is given by your care team in a hospital or clinic setting. Talk to your care team the use of this medication in children. Special care may be needed. Overdosage: If you think you have taken too much of this medicine contact a poison control center or emergency room at once. NOTE: This medicine is only for you. Do not share this medicine with others. What if I miss a dose? It is important not to miss your dose. Call your care team if you are unable to keep an appointment. What may interact with this medication? Other iron  products This list may not describe all possible interactions. Give your health care provider a list of all the medicines, herbs, non-prescription drugs, or dietary supplements you use. Also tell them if you smoke, drink alcohol, or use illegal drugs. Some items may interact with your medicine. What should I watch for while using this medication? Visit your care team for regular checks on your progress. Tell your care team if your symptoms do not start to get better or if they get worse. You may need blood work done while you are taking this medication. You may need to eat more foods that contain iron . Talk to your care team. Foods that contain iron  include whole grains or cereals, dried fruits, beans, peas, leafy green vegetables, and organ meats (liver, kidney). What side effects may I notice from receiving this medication? Side effects that you should report to your care team as soon as possible: Allergic reactions--skin rash, itching, hives, swelling of the face, lips, tongue, or throat Low blood pressure--dizziness, feeling faint or lightheaded, blurry vision Shortness of breath Side effects that usually do not require medical attention (report to your care team if they continue or are bothersome): Flushing Headache Joint pain Muscle pain Nausea Pain, redness, or  irritation at injection site This list may not describe all possible side effects. Call your doctor for medical advice about side effects. You may report side effects to FDA at 1-800-FDA-1088. Where should I keep my medication? This medication is given in a hospital or clinic. It will not be stored at home. NOTE: This sheet is a summary. It may not cover all possible information. If you have questions about this medicine, talk to your doctor, pharmacist, or health care provider.  2024 Elsevier/Gold Standard (2023-02-11 00:00:00)

## 2023-08-14 NOTE — Progress Notes (Signed)
 Patient took her own pre-meds at 1220.

## 2023-08-14 NOTE — Progress Notes (Signed)
 Patient presents today for Feraheme infusion. Vital signs stable. Patient denies any side effects related to iron  infusions. MAR reviewed and updated. Patient took Claritin  10 mg and Tylenol  650 mg PO at 12:30 pm.   Feraheme given today per MD orders. Tolerated infusion without adverse affects. Vital signs stable. No complaints at this time. [name] Alert and oriented x 3. F/U with [facility] as scheduled.

## 2023-08-21 ENCOUNTER — Inpatient Hospital Stay: Payer: Medicare Other

## 2023-08-21 VITALS — BP 126/66 | HR 72 | Temp 97.7°F | Resp 18

## 2023-08-21 DIAGNOSIS — D509 Iron deficiency anemia, unspecified: Secondary | ICD-10-CM | POA: Diagnosis not present

## 2023-08-21 DIAGNOSIS — D500 Iron deficiency anemia secondary to blood loss (chronic): Secondary | ICD-10-CM

## 2023-08-21 MED ORDER — SODIUM CHLORIDE 0.9 % IV SOLN
510.0000 mg | Freq: Once | INTRAVENOUS | Status: AC
  Administered 2023-08-21: 510 mg via INTRAVENOUS
  Filled 2023-08-21: qty 510

## 2023-08-21 MED ORDER — SODIUM CHLORIDE 0.9 % IV SOLN
INTRAVENOUS | Status: DC
Start: 2023-08-21 — End: 2023-08-21

## 2023-08-21 NOTE — Patient Instructions (Signed)

## 2023-08-21 NOTE — Progress Notes (Signed)
Patient took own premeds at home.  Patient tolerated iron infusion with no complaints voiced.  Peripheral IV site clean and dry with good blood return noted before and after infusion.  Band aid applied.  VSS with discharge and left in satisfactory condition with no s/s of distress noted.

## 2023-09-01 ENCOUNTER — Inpatient Hospital Stay: Payer: Medicare Other

## 2023-09-08 ENCOUNTER — Inpatient Hospital Stay: Payer: Medicare Other | Admitting: Physician Assistant

## 2023-09-18 ENCOUNTER — Telehealth (INDEPENDENT_AMBULATORY_CARE_PROVIDER_SITE_OTHER): Payer: Self-pay | Admitting: Otolaryngology

## 2023-09-18 ENCOUNTER — Inpatient Hospital Stay: Payer: Medicare Other | Attending: Hematology

## 2023-09-18 DIAGNOSIS — I4891 Unspecified atrial fibrillation: Secondary | ICD-10-CM | POA: Diagnosis not present

## 2023-09-18 DIAGNOSIS — Z7901 Long term (current) use of anticoagulants: Secondary | ICD-10-CM | POA: Diagnosis not present

## 2023-09-18 DIAGNOSIS — Z7982 Long term (current) use of aspirin: Secondary | ICD-10-CM | POA: Insufficient documentation

## 2023-09-18 DIAGNOSIS — Z79899 Other long term (current) drug therapy: Secondary | ICD-10-CM | POA: Diagnosis not present

## 2023-09-18 DIAGNOSIS — D500 Iron deficiency anemia secondary to blood loss (chronic): Secondary | ICD-10-CM

## 2023-09-18 DIAGNOSIS — D509 Iron deficiency anemia, unspecified: Secondary | ICD-10-CM | POA: Insufficient documentation

## 2023-09-18 LAB — CBC WITH DIFFERENTIAL/PLATELET
Abs Immature Granulocytes: 0.01 10*3/uL (ref 0.00–0.07)
Basophils Absolute: 0 10*3/uL (ref 0.0–0.1)
Basophils Relative: 1 %
Eosinophils Absolute: 0.1 10*3/uL (ref 0.0–0.5)
Eosinophils Relative: 3 %
HCT: 39.1 % (ref 36.0–46.0)
Hemoglobin: 12.6 g/dL (ref 12.0–15.0)
Immature Granulocytes: 0 %
Lymphocytes Relative: 16 %
Lymphs Abs: 0.8 10*3/uL (ref 0.7–4.0)
MCH: 29.5 pg (ref 26.0–34.0)
MCHC: 32.2 g/dL (ref 30.0–36.0)
MCV: 91.6 fL (ref 80.0–100.0)
Monocytes Absolute: 0.3 10*3/uL (ref 0.1–1.0)
Monocytes Relative: 5 %
Neutro Abs: 4 10*3/uL (ref 1.7–7.7)
Neutrophils Relative %: 75 %
Platelets: 210 10*3/uL (ref 150–400)
RBC: 4.27 MIL/uL (ref 3.87–5.11)
RDW: 16.3 % — ABNORMAL HIGH (ref 11.5–15.5)
WBC: 5.2 10*3/uL (ref 4.0–10.5)
nRBC: 0 % (ref 0.0–0.2)

## 2023-09-18 LAB — IRON AND TIBC
Iron: 108 ug/dL (ref 28–170)
Saturation Ratios: 41 % — ABNORMAL HIGH (ref 10.4–31.8)
TIBC: 264 ug/dL (ref 250–450)
UIBC: 156 ug/dL

## 2023-09-18 LAB — FERRITIN: Ferritin: 113 ng/mL (ref 11–307)

## 2023-09-18 LAB — SAMPLE TO BLOOD BANK

## 2023-09-18 NOTE — Telephone Encounter (Signed)
 LVM to confirm appt & location [phone] afm

## 2023-09-21 ENCOUNTER — Ambulatory Visit (INDEPENDENT_AMBULATORY_CARE_PROVIDER_SITE_OTHER): Payer: Medicare Other

## 2023-09-21 ENCOUNTER — Encounter (INDEPENDENT_AMBULATORY_CARE_PROVIDER_SITE_OTHER): Payer: Self-pay

## 2023-09-21 ENCOUNTER — Inpatient Hospital Stay: Payer: Medicare Other

## 2023-09-21 ENCOUNTER — Ambulatory Visit (INDEPENDENT_AMBULATORY_CARE_PROVIDER_SITE_OTHER): Admitting: Audiology

## 2023-09-21 VITALS — BP 148/74 | Ht 62.0 in | Wt 100.0 lb

## 2023-09-21 DIAGNOSIS — H903 Sensorineural hearing loss, bilateral: Secondary | ICD-10-CM | POA: Diagnosis not present

## 2023-09-21 DIAGNOSIS — H6123 Impacted cerumen, bilateral: Secondary | ICD-10-CM

## 2023-09-21 DIAGNOSIS — R42 Dizziness and giddiness: Secondary | ICD-10-CM

## 2023-09-21 NOTE — Progress Notes (Unsigned)
  [address], [address] [place], [place] [address] [phone]  Audiological Evaluation    Name: Haley Trevino     DOB:   Jan 14, 1941      MRN:   [id number]                                                                                     Service Date: 09/21/2023     Accompanied by: husband   Patient comes today after Dr. Suszanne Conners, ENT sent a referral for a hearing evaluation due to concerns with hearing loss.   Symptoms Yes Details  Hearing loss  [x]  Known hearing loss in both ears. Patient reports a decline, worse in the right ear. The left side seemed like it went back to normal after it popped.  Patient had  a hearing test about 2 years ago completed at Dr. Avel [facility] clinic which showed hearing loss in both ears, slightly worse in the right ear.  Tinnitus  []    Ear pain/ infections/pressure  []    Balance problems  [x]  Patient is today off balance when walking/standing. Reported having falls in the past.  Noise exposure history  []    Previous ear surgeries  []    Family history of hearing loss  []    Amplification  []    Other  [x]  Dr. Suszanne Conners cleaned patient's ears prior to completing the hearing test.    Otoscopy: Right ear: Clear external ear canals and notable landmarks visualized on the tympanic membrane. Left ear:  Clear external ear canals and notable landmarks visualized on the tympanic membrane.  Tympanometry: Right ear: Type As- Normal external ear canal volume with normal middle ear pressure and low tympanic membrane compliance Left ear: Type A- Normal external ear canal volume with normal middle ear pressure and tympanic membrane compliance   Pure tone Audiometry:  Normal to severe sensorineural hearing loss from 250 Hz - 8000 Hz, in both ears.    Speech Audiometry: Right ear- Speech Reception Threshold (SRT) was obtained at 45 dBHL. Left ear-Speech Reception Threshold (SRT) was obtained at 35 dBHL.   Word Recognition Score Tested using NU-6 (MLV) Right  ear: 60% was obtained at a presentation level of 85 dBHL with contralateral masking which is deemed as  poor. Left ear: 72% was obtained at a presentation level of 80 dBHL with contralateral masking which is deemed as  fair.   The hearing test results were completed under headphones and results are deemed to be of good reliability. Test technique:  conventional     Recommendations: Follow up with ENT as scheduled for today. Return for a hearing evaluation if concerns with hearing changes arise or per MD recommendation. Consider a communication needs assessment after medical clearance for hearing aids is obtained.   Haley Trevino, AUD

## 2023-09-22 DIAGNOSIS — H903 Sensorineural hearing loss, bilateral: Secondary | ICD-10-CM | POA: Insufficient documentation

## 2023-09-22 DIAGNOSIS — H6123 Impacted cerumen, bilateral: Secondary | ICD-10-CM | POA: Insufficient documentation

## 2023-09-22 DIAGNOSIS — R42 Dizziness and giddiness: Secondary | ICD-10-CM | POA: Insufficient documentation

## 2023-09-22 NOTE — Progress Notes (Signed)
 Patient ID: Haley Trevino, female   DOB: March 16, 1941, 83 y.o.   MRN: [id number]  CC: Hearing loss, dizziness  HPI: The patient is an 83 year old female who returns today for her follow-up evaluation.  The patient was previously seen for bilateral high-frequency sensorineural hearing loss.  She also has a history of recurrent cerumen impaction.  The patient presents today complaining of progressive worsening of her hearing.  She has noted increasing clogging sensation in her ears.  In addition, she also complains of recurrent dizziness.  She describes her dizziness as an off-balance sensation.  She denies any spinning vertigo, otalgia, or otorrhea.  Exam: General: Communicates without difficulty, well nourished, no acute distress. Head: Normocephalic, no evidence injury, no tenderness, facial buttresses intact without stepoff. Eyes: PERRL, EOMI. No scleral icterus, conjunctivae clear. Neuro: CN II exam reveals vision grossly intact.  No nystagmus at any point of gaze. Ears: Bilateral cerumen impaction. Nose: External evaluation reveals normal support and skin without lesions.  Dorsum is intact.  Anterior rhinoscopy reveals pink mucosa over anterior aspect of inferior turbinates and intact septum.  No purulence noted. Oral:  Oral cavity and oropharynx are intact, symmetric, without erythema or edema.  Mucosa is moist without lesions. Neck: Full range of motion without pain.  There is no significant lymphadenopathy.  No masses palpable.  Thyroid bed within normal limits to palpation.  Parotid glands and submandibular glands equal bilaterally without mass.  Trachea is midline. Neuro:  CN 2-12 grossly intact. Gait wide-based. Vestibular: No nystagmus at any point of gaze. The cerebellar examination is unremarkable.   Procedure: Bilateral cerumen disimpaction Anesthesia: None Description: Under the operating microscope, the cerumen is carefully removed with a combination of cerumen currette, alligator  forceps, and suction catheters.  After the cerumen is removed, the TMs are noted to be normal.  No mass, erythema, or lesions. The patient tolerated the procedure well.   AUDIOMETRIC TESTING: I have read and reviewed the audiometric test, which shows bilateral high-frequency sensorineural hearing loss. The tympanogram is normal bilaterally.   Assessment:  1.  Bilateral cerumen impaction.  After the disimpaction procedure, both tympanic membranes and middle ear spaces are noted to be normal. 2.  Stable bilateral high-frequency sensorineural hearing loss. 3.  Recurrent dizziness of unknown etiology. The possible differential diagnoses include transient BPPV, vestibular migraine, Meniere's disease, peripheral vestibular dysfunction, or other central/systemic causes.    Plan: 1.  Otomicroscopy with bilateral cerumen disimpaction. 2.  The physical exam findings and the hearing test results are reviewed with the patient. 3.  The patient is a candidate for hearing amplification. 4.  The patient may also benefit from physical and balance therapy to help with her chronic imbalance.

## 2023-09-25 ENCOUNTER — Ambulatory Visit: Payer: Medicare Other

## 2023-09-25 DIAGNOSIS — I442 Atrioventricular block, complete: Secondary | ICD-10-CM

## 2023-09-25 LAB — CUP PACEART REMOTE DEVICE CHECK
Battery Remaining Longevity: 114 mo
Battery Remaining Percentage: 90 %
Battery Voltage: 3.01 V
Brady Statistic AP VP Percent: 1 %
Brady Statistic AP VS Percent: 1.8 %
Brady Statistic AS VP Percent: 1.9 %
Brady Statistic AS VS Percent: 96 %
Brady Statistic RA Percent Paced: 1.8 %
Brady Statistic RV Percent Paced: 2.1 %
Date Time Interrogation Session: 20250321020015
Implantable Lead Connection Status: 753985
Implantable Lead Connection Status: 753985
Implantable Lead Implant Date: 20231121
Implantable Lead Implant Date: 20231121
Implantable Lead Location: 753859
Implantable Lead Location: 753860
Implantable Pulse Generator Implant Date: 20231121
Lead Channel Impedance Value: 380 Ohm
Lead Channel Impedance Value: 410 Ohm
Lead Channel Pacing Threshold Amplitude: 0.625 V
Lead Channel Pacing Threshold Amplitude: 1 V
Lead Channel Pacing Threshold Pulse Width: 0.4 ms
Lead Channel Pacing Threshold Pulse Width: 0.5 ms
Lead Channel Sensing Intrinsic Amplitude: 1 mV
Lead Channel Sensing Intrinsic Amplitude: 3.3 mV
Lead Channel Setting Pacing Amplitude: 1.25 V
Lead Channel Setting Pacing Amplitude: 1.625
Lead Channel Setting Pacing Pulse Width: 0.5 ms
Lead Channel Setting Sensing Sensitivity: 2 mV
Pulse Gen Model: 2272
Pulse Gen Serial Number: 6860174

## 2023-09-26 NOTE — Progress Notes (Unsigned)
 [facility] [address], [place] [address]   CLINIC:  Medical Oncology/Hematology  PCP:  Bennie Pierini, FNP [address] [place] [address] [phone]  REASON FOR VISIT:  Follow-up for iron deficiency anemia   CURRENT THERAPY: IV iron infusions with intermittent PRBC transfusions  INTERVAL HISTORY:  Haley Trevino (83 y.o. female) returns for routine follow-up of iron deficiency anemia.  She was last evaluated via telemedicine visit by Rojelio Brenner PA-C on 05/26/2023.  In the interim since her last visit, patient contacted clinic due to increased fatigue and weakness.  Labs checked on 07/28/2023 showed significant drop in Hgb at 8.5 (down from 12.7 on 05/20/2023), with significant iron deficiency.  She was scheduled for IV Feraheme x 3 and instructed to reach out to her gastroenterologist due to severe drop in hemoglobin with history of prior GI bleeding.  ***  At today's visit, she reports that she felt *** after IV iron. *** She does report having ongoing fatigue and diffuse musculoskeletal pain from her fibromyalgia and other chronic comorbidities. *** She also reports vertigo, tremors, general malaise, and headaches. *** She has chronic dyspnea on exertion, but she denies any exertional chest pain. *** She remains off of Eliquis to date, but is taking 81 mg aspirin.  *** GI follow-up?? *** She notes occasional dark stool, but denies any gross hematochezia or profuse melena. *** She had previously lost some weight following TAVR in November 2023, but is now back up to her baseline weight.   She reports 25***% energy and 25***% appetite.   ASSESSMENT & PLAN:  1.    Iron deficiency anemia - Iron deficiency anemia secondary to GI bleedings, has required blood transfusions - EGD (10/25/2020): 2 angiodysplastic lesions without bleeding in the duodenum, another angiodysplastic lesion in duodenum; treated by APC - Capsule study  (11/29/2020): At least 3 proximal and mid small bowel AVMs, nonbleeding, but also with some possible red material in the proximal colon - Other work-up: SPEP/IFE/light chains unremarkable; LDH normal; CMP unremarkable  - She had TAVR on 05/20/2022, and was hospitalized from 05/13/2022 through 05/29/2022 (PRBC transfusion x 1 on 05/13/2022); hemoglobin was stable at discharge around 9.8. - Was placed on Eliquis for new onset A-fib/flutter in November 2023, but developed profuse melanotic stool within 48 hours of starting Eliquis.  Patient self discontinued her Eliquis and within 24 hours of stopping Eliquis her bowel movements had returned to normal. - Severe anemia with Hgb 6.6 on 06/09/2022, therefore transfused PRBC x 2 units. - She continues to follow with GI (Dr. Ewing Schlein via [facility])*** - Most recent IV iron with Feraheme x 3 in February 2025 - Denies any recent melena or hematochezia*** - Labs (09/18/2023): Hgb 12.6/MCV 91.6, ferritin 113, iron saturation 41%.  Previous labs showed normal creatinine/GFR. - Differential diagnosis favors iron deficiency anemia secondary to chronic GI blood loss; may also be an aspect of malabsorption in the setting of scleroderma, low meat intake, and use of PPI - PLAN: No IV iron at this time.*** - Monthly CBC/D with BB sample - Labs (CBC/D, BB sample, ferritin, iron/TIBC) and RTC in 2 *** 3 *** months with PHONE *** visit   PLAN SUMMARY: *** TBD *** >> *** >> *** >> ***  ** Last office visit ***     REVIEW OF SYSTEMS: ***  Review of Systems - Oncology   PHYSICAL EXAM:  ECOG PERFORMANCE STATUS: {CHL ONC ECOG NG:[id number]} *** There were no vitals filed  for this visit. There were no vitals filed for this visit. Physical Exam  PAST MEDICAL/SURGICAL HISTORY:  Past Medical History:  Diagnosis Date   Cataract    Depression    GERD (gastroesophageal reflux disease)    Hyperlipidemia    Hypertension    Hypothyroidism    IBS (irritable  bowel syndrome)    Osteoarthritis    Raynaud disease    S/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 Lynnette Caffey & Bartle   [facility])    Thyroid disease    hypothyroidism   Past Surgical History:  Procedure Laterality Date   BREAST ENHANCEMENT SURGERY  1975   BREAST IMPLANT REMOVAL Bilateral 04/23/2016   Procedure: REMOVALBILATERAL BREAST IMPLANTS;  Surgeon: Peggye Form, DO;  Location: [facility];  Service: Plastics;  Laterality: Bilateral;   CHOLECYSTECTOMY  1973   ESOPHAGOGASTRODUODENOSCOPY N/A 10/25/2020   Procedure: ESOPHAGOGASTRODUODENOSCOPY (EGD);  Surgeon: Vida Rigger, MD;  Location: [facility];  Service: Endoscopy;  Laterality: N/A;  enteroscopy   HAND SURGERY  08/2012; 11/2012   HOT HEMOSTASIS N/A 10/25/2020   Procedure: HOT HEMOSTASIS (ARGON PLASMA COAGULATION/BICAP);  Surgeon: Vida Rigger, MD;  Location: [facility];  Service: Endoscopy;  Laterality: N/A;   INTRAOPERATIVE TRANSTHORACIC ECHOCARDIOGRAM N/A 05/13/2022   Procedure: INTRAOPERATIVE TRANSTHORACIC ECHOCARDIOGRAM;  Surgeon: Kathleene Hazel, MD;  Location: [facility];  Service: Open Heart Surgery;  Laterality: N/A;   INTRAOPERATIVE TRANSTHORACIC ECHOCARDIOGRAM N/A 05/20/2022   Procedure: INTRAOPERATIVE TRANSTHORACIC ECHOCARDIOGRAM;  Surgeon: Orbie Pyo, MD;  Location: [facility];  Service: Open Heart Surgery;  Laterality: N/A;   IR RADIOLOGIST EVAL & MGMT  07/27/2017   IR SACROPLASTY BILATERAL  07/31/2017   IR VERTEBROPLASTY CERV/THOR BX INC UNI/BIL INC/INJECT/IMAGING  03/13/2020   IR VERTEBROPLASTY EA ADDL (T&LS) BX INC UNI/BIL INC INJECT/IMAGING  03/14/2020   MELANOMA EXCISION     at 31 yrs of age   ORIF HIP FRACTURE Right 02/22/2014   Procedure: OPEN REDUCTION INTERNAL FIXATION RIGHT HIP;  Surgeon: Darreld Mclean, MD;  Location: [facility];  Service: Orthopedics;  Laterality: Right;   PACEMAKER IMPLANT  N/A 05/27/2022   Procedure: PACEMAKER IMPLANT;  Surgeon: Lanier Prude, MD;  Location: [facility]VASIVE CV LAB;  Service: Cardiovascular;  Laterality: N/A;   RIGHT HEART CATH N/A 01/16/2023   Procedure: RIGHT HEART CATH;  Surgeon: Dorthula Nettles, DO;  Location: [facility];  Service: Cardiovascular;  Laterality: N/A;   RIGHT HEART CATH AND CORONARY ANGIOGRAPHY N/A 03/28/2022   Procedure: RIGHT HEART CATH AND CORONARY ANGIOGRAPHY;  Surgeon: Orbie Pyo, MD;  Location: [facility];  Service: Cardiovascular;  Laterality: N/A;   TOTAL ABDOMINAL HYSTERECTOMY  1974   TRANSCATHETER AORTIC VALVE REPLACEMENT, TRANSFEMORAL Left 05/13/2022   Procedure: Transcatheter Aortic Valve Replacement, Transfemoral;  Surgeon: Kathleene Hazel, MD;  Location: [facility];  Service: Open Heart Surgery;  Laterality: Left;   TRANSCATHETER AORTIC VALVE REPLACEMENT, TRANSFEMORAL Left 05/20/2022   Procedure: Transcatheter Aortic Valve Replacement, Transfemoral using an Edwards SAPIEN 3 Ultra 23 MM Heart Valve;  Surgeon: Orbie Pyo, MD;  Location: [facility];  Service: Open Heart Surgery;  Laterality: Left;  Left Transfemoral    SOCIAL HISTORY:  Social History   Socioeconomic History   Marital status: Married    Spouse name: Not on file   Number of children: 2   Years of education: Not on  file   Highest education level: Some college, no degree  Occupational History   Occupation: retired Holiday representative at [employer]  Tobacco Use   Smoking status: Never   Smokeless tobacco: Never   Tobacco comments:    passive tobacco smoke exposure as child  Vaping Use   Vaping status: Never Used  Substance and Sexual Activity   Alcohol use: No   Drug use: No   Sexual activity: Not Currently    Birth control/protection: Surgical  Other Topics Concern   Not on file  Social History Narrative   Regular exercise: walkingCaffeine use: 1 cup of coffee daily   Lives in split level home with her  husband   Social Drivers of Corporate investment banker Strain: Low Risk  (06/08/2023)   Overall Financial Resource Strain (CARDIA)    Difficulty of Paying Living Expenses: Not hard at all  Food Insecurity: No Food Insecurity (06/08/2023)   Hunger Vital Sign    Worried About Running Out of Food in the Last Year: Never true    Ran Out of Food in the Last Year: Never true  Transportation Needs: No Transportation Needs (06/08/2023)   PRAPARE - Administrator, Civil Service (Medical): No    Lack of Transportation (Non-Medical): No  Physical Activity: Inactive (06/08/2023)   Exercise Vital Sign    Days of Exercise per Week: 0 days    Minutes of Exercise per Session: 0 min  Stress: No Stress Concern Present (06/08/2023)   [employer] of Occupational Health - Occupational Stress Questionnaire    Feeling of Stress : Not at all  Social Connections: Moderately Integrated (06/08/2023)   Social Connection and Isolation Panel [NHANES]    Frequency of Communication with Friends and Family: More than three times a week    Frequency of Social Gatherings with Friends and Family: Three times a week    Attends Religious Services: More than 4 times per year    Active Member of Clubs or Organizations: No    Attends Banker Meetings: Never    Marital Status: Married  Catering manager Violence: Not At Risk (06/08/2023)   Humiliation, Afraid, Rape, and Kick questionnaire    Fear of Current or Ex-Partner: No    Emotionally Abused: No    Physically Abused: No    Sexually Abused: No    FAMILY HISTORY:  Family History  Problem Relation Age of Onset   Pancreatic cancer Father    Raynaud syndrome Father    Cancer Father        pancreatic   Rashes / Skin problems Daughter        possibly scleraderma   Hip fracture Mother    Mental illness Mother        attempted suicide at 100 yo    CURRENT MEDICATIONS:  Outpatient Encounter Medications as of 09/28/2023  Medication Sig    acetaminophen (TYLENOL) 500 MG tablet Take 1,000 mg by mouth at bedtime.   acidophilus (RISAQUAD) CAPS capsule Take 2 capsules by mouth daily.   aspirin EC 81 MG tablet Take 81 mg by mouth daily. Swallow whole.   clonazePAM (KLONOPIN) 0.5 MG tablet Take 1 tablet (0.5 mg total) by mouth 2 (two) times daily as needed for anxiety.   ezetimibe (ZETIA) 10 MG tablet Take 1 tablet (10 mg total) by mouth daily.   FLUoxetine (PROZAC) 40 MG capsule Take 1 capsule (40 mg total) by mouth daily.   fluticasone (FLONASE) 50 MCG/ACT nasal spray  PLACE 2 SPRAYS INTO BOTH NOSTRILS DAILY (Patient taking differently: Place 2 sprays into both nostrils as needed.)   furosemide (LASIX) 20 MG tablet Take 1 tablet (20 mg total) by mouth daily as needed.   NIFEdipine (PROCARDIA XL/NIFEDICAL XL) 60 MG 24 hr tablet Take 1 tablet (60 mg total) by mouth daily.   NONFORMULARY OR COMPOUNDED ITEM Take 1 mg by mouth at bedtime. Low dose naltrexone (Patient taking differently: Take 1 mg by mouth at bedtime. Low dose naltrexone Anti-inflammatory with strict diet to avoid inflammation)   omeprazole (PRILOSEC) 20 MG capsule TAKE 1 TABLET DAILY   PROLIA 60 MG/ML SOSY injection Inject 60 mg into the skin every 6 (six) months.   RESTASIS 0.05 % ophthalmic emulsion Place 1 drop into both eyes 2 (two) times daily.   sildenafil (REVATIO) 20 MG tablet Take 1 tablet (20 mg total) by mouth 2 (two) times daily.   SYNTHROID 75 MCG tablet TAKE (1) TABLET DAILY BE- FORE BREAKFAST.   No facility-administered encounter medications on file as of 09/28/2023.    ALLERGIES:  Allergies  Allergen Reactions   Bactrim [Sulfamethoxazole-Trimethoprim] Nausea And Vomiting   Dilaudid [Hydromorphone Hcl] Anaphylaxis   Eliquis [Apixaban] Other (See Comments)    Caused bleed   Acyclovir And Related Nausea And Vomiting   Crestor [Rosuvastatin Calcium] Other (See Comments)    Muscle pain   Gabapentin Nausea And Vomiting and Swelling    Swelling in feet and  in legs   Aleve [Naproxen Sodium] Rash   Lyrica [Pregabalin] Other (See Comments)    Swelling in feet, and in legs    LABORATORY DATA:  I have reviewed the labs as listed.  CBC    Component Value Date/Time   WBC 5.2 09/18/2023 1036   RBC 4.27 09/18/2023 1036   HGB 12.6 09/18/2023 1036   HGB 9.5 (L) 08/10/2023 1432   HCT 39.1 09/18/2023 1036   HCT 30.8 (L) 08/10/2023 1432   PLT 210 09/18/2023 1036   PLT 323 08/10/2023 1432   MCV 91.6 09/18/2023 1036   MCV 92 08/10/2023 1432   MCH 29.5 09/18/2023 1036   MCHC 32.2 09/18/2023 1036   RDW 16.3 (H) 09/18/2023 1036   RDW 15.7 (H) 08/10/2023 1432   LYMPHSABS 0.8 09/18/2023 1036   LYMPHSABS 1.2 08/10/2023 1432   MONOABS 0.3 09/18/2023 1036   EOSABS 0.1 09/18/2023 1036   EOSABS 0.1 08/10/2023 1432   BASOSABS 0.0 09/18/2023 1036   BASOSABS 0.1 08/10/2023 1432      Latest Ref Rng & Units 08/10/2023    2:32 PM 08/02/2023   10:16 PM 05/29/2023   12:28 PM  CMP  Glucose 70 - 99 mg/dL 98  621  308   BUN 8 - 27 mg/dL 11  9  9    Creatinine 0.57 - 1.00 mg/dL 6.57  8.46  9.62   Sodium 134 - 144 mmol/L 140  137  143   Potassium 3.5 - 5.2 mmol/L 3.7  3.8  4.2   Chloride 96 - 106 mmol/L 104  106  105   CO2 20 - 29 mmol/L 21  21  18    Calcium 8.7 - 10.3 mg/dL 8.7  8.9  9.0   Total Protein 6.0 - 8.5 g/dL 6.8     Total Bilirubin 0.0 - 1.2 mg/dL <9.5     Alkaline Phos 44 - 121 IU/L 83     AST 0 - 40 IU/L 15     ALT 0 - 32 IU/L 7  DIAGNOSTIC IMAGING:  I have independently reviewed the relevant imaging and discussed with the patient.   WRAP UP:  All questions were answered. The patient knows to call the clinic with any problems, questions or concerns.  Medical decision making: ***  Time spent on visit: I spent *** minutes counseling the patient face to face. The total time spent in the appointment was *** minutes and more than 50% was on counseling.  Carnella Guadalajara, PA-C  ***

## 2023-09-27 ENCOUNTER — Encounter: Payer: Self-pay | Admitting: Cardiology

## 2023-09-28 ENCOUNTER — Encounter: Payer: Self-pay | Admitting: Physician Assistant

## 2023-09-28 ENCOUNTER — Inpatient Hospital Stay (HOSPITAL_BASED_OUTPATIENT_CLINIC_OR_DEPARTMENT_OTHER): Payer: Medicare Other | Admitting: Physician Assistant

## 2023-09-28 VITALS — BP 137/55 | HR 76 | Temp 98.2°F | Resp 18 | Wt 102.3 lb

## 2023-09-28 DIAGNOSIS — D500 Iron deficiency anemia secondary to blood loss (chronic): Secondary | ICD-10-CM | POA: Diagnosis not present

## 2023-09-28 DIAGNOSIS — D509 Iron deficiency anemia, unspecified: Secondary | ICD-10-CM | POA: Diagnosis not present

## 2023-09-28 DIAGNOSIS — Z79899 Other long term (current) drug therapy: Secondary | ICD-10-CM | POA: Diagnosis not present

## 2023-09-28 DIAGNOSIS — I4891 Unspecified atrial fibrillation: Secondary | ICD-10-CM | POA: Diagnosis not present

## 2023-09-28 DIAGNOSIS — Z7982 Long term (current) use of aspirin: Secondary | ICD-10-CM | POA: Diagnosis not present

## 2023-09-28 DIAGNOSIS — Z7901 Long term (current) use of anticoagulants: Secondary | ICD-10-CM | POA: Diagnosis not present

## 2023-10-12 ENCOUNTER — Encounter
Payer: Medicare Other | Attending: Physical Medicine and Rehabilitation | Admitting: Physical Medicine and Rehabilitation

## 2023-10-12 VITALS — BP 153/63 | HR 77 | Ht 62.0 in | Wt 103.0 lb

## 2023-10-12 DIAGNOSIS — M7542 Impingement syndrome of left shoulder: Secondary | ICD-10-CM | POA: Diagnosis present

## 2023-10-12 DIAGNOSIS — Z79891 Long term (current) use of opiate analgesic: Secondary | ICD-10-CM | POA: Insufficient documentation

## 2023-10-12 DIAGNOSIS — Z8781 Personal history of (healed) traumatic fracture: Secondary | ICD-10-CM | POA: Insufficient documentation

## 2023-10-12 DIAGNOSIS — T781XXA Other adverse food reactions, not elsewhere classified, initial encounter: Secondary | ICD-10-CM | POA: Diagnosis present

## 2023-10-12 DIAGNOSIS — Z5181 Encounter for therapeutic drug level monitoring: Secondary | ICD-10-CM | POA: Diagnosis present

## 2023-10-12 DIAGNOSIS — R5381 Other malaise: Secondary | ICD-10-CM | POA: Insufficient documentation

## 2023-10-12 DIAGNOSIS — G894 Chronic pain syndrome: Secondary | ICD-10-CM | POA: Insufficient documentation

## 2023-10-12 MED ORDER — TOPIRAMATE 25 MG PO TABS: 25.0000 mg | ORAL_TABLET | Freq: Every evening | ORAL | 3 refills | Status: DC | PRN

## 2023-10-12 NOTE — Patient Instructions (Signed)
 HTN: Foods that can help lower blood pressure and provided with a list: 1) citrus foods- high in vitamins and minerals 2) salmon and other fatty fish - reduces inflammation and oxylipins 3) swiss chard (leafy green)- high level of nitrates 4) pumpkin seeds- one of the best natural sources of magnesium 5) Beans and lentils- high in fiber, magnesium, and potassium 6) Berries- high in flavonoids 7) Amaranth (whole grain, can be cooked similarly to rice and oats)- high in magnesium and fiber 8) Pistachios- even more effective at reducing BP than other nuts 9) Carrots- high in phenolic compounds that relax blood vessels and reduce inflammation 10) Celery- contain phthalides that relax tissues of arterial walls 11) Tomatoes- can also improve cholesterol and reduce risk of heart disease 12) Broccoli- good source of magnesium, calcium, and potassium 13) Greek yogurt: high in potassium and calcium 14) Herbs and spices: Celery seed, cilantro, saffron, lemongrass, black cumin, ginseng, cinnamon, cardamom, sweet basil, and ginger 15) Chia and flax seeds- also help to lower cholesterol and blood sugar 16) Beets- high levels of nitrates that relax blood vessels  17) spinach and bananas- high in potassium  -Provided lise of supplements that can help with hypertension:  1) magnesium: one high quality brand is Bioptemizers since it contains all 7 types of magnesium, otherwise over the counter magnesium gluconate 400mg  is a good option 2) B vitamins 3) vitamin D 4) potassium 5) CoQ10 6) L-arginine 7) Vitamin C 8) Beetroot -Educated that goal BP is 120/80. -Made goal to incorporate some of the above foods into diet.     Foods that may reduce pain: 1) Ginger (especially studied for arthritis)- reduce leukotriene production to decrease inflammation 2) Blueberries- high in phytonutrients that decrease inflammation 3) Salmon- marine omega-3s reduce joint swelling and pain 4) Pumpkin seeds- reduce  inflammation 5) dark chocolate- reduces inflammation 6) turmeric- reduces inflammation 7) tart cherries - reduce pain and stiffness 8) extra virgin olive oil - its compound olecanthal helps to block prostaglandins  9) chili peppers- can be eaten or applied topically via capsaicin 10) mint- helpful for headache, muscle aches, joint pain, and itching 11) garlic- reduces inflammation 12) Green tea- reduces inflammation and oxidative stress, helps with weight loss, may reduce the risk of cancer, recommend Double Green Matcha [place] of Tea daily  Link to further information on diet for chronic pain: http://www.bray.[email or web address]

## 2023-10-12 NOTE — Addendum Note (Signed)
 Addended by: Silas Sacramento T on: 10/12/2023 02:51 PM   Modules accepted: Orders

## 2023-10-12 NOTE — Progress Notes (Addendum)
 Subjective:    Patient ID: Haley Trevino, female    DOB: [birth year], 83 y.o.   MRN: [id number]  HPI Haley Trevino is an 83 year old woman who presents to establish care for chronic pain.  1) Chronic pain: -she has pain everywhere -she was diagnosed with scleroderma and fibromyalgia -not doing well  2) Left rotator cuff tear -was going to have surgery but it had do be postponed due to need for pacemaker placement  3) IBS: -symptoms have improved -avoids fried foods -has not missed chocolate or cookies -she tasted a lot of foods at Thanksgiving, including dressing -bread is her problem- she has to have it.   4) s/p multiple falls: -continues to fall -she was found to be dehydrated -she was given fluids -she does not drink enough -they scanned her carotid arteries  5) Cardiac debility: -has had decreased strength and balance afterward -she shakes all the time  6) Dizziness: -her PCP said this may be from anemia  7) History of hip fracture: -discussed that her hip has never been the same after her fracture  8) History of breast implant removal -has pain from breast implant removal  Pain Inventory Average Pain 4 Pain Right Now 4 My pain is intermittent, constant, and sharp  In the last 24 hours, has pain interfered with the following? General activity 4 Relation with others 4 Enjoyment of life 4 What TIME of day is your pain at its worst? morning , daytime, evening, night, and varies Sleep (in general) Fair  Pain is worse with: bending, sitting, and some activites Pain improves with: rest, heat/ice, and medication Relief from Meds: 5    Family History  Problem Relation Age of Onset   Pancreatic cancer Father    Raynaud syndrome Father    Cancer Father        pancreatic   Rashes / Skin problems Daughter        possibly scleraderma   Hip fracture Mother    Mental illness Mother        attempted suicide at 68 yo   Social History    Socioeconomic History   Marital status: Married    Spouse name: Not on file   Number of children: 2   Years of education: Not on file   Highest education level: Some college, no degree  Occupational History   Occupation: retired Holiday representative at [employer]  Tobacco Use   Smoking status: Never   Smokeless tobacco: Never   Tobacco comments:    passive tobacco smoke exposure as child  Vaping Use   Vaping status: Never Used  Substance and Sexual Activity   Alcohol use: No   Drug use: No   Sexual activity: Not Currently    Birth control/protection: Surgical  Other Topics Concern   Not on file  Social History Narrative   Regular exercise: walkingCaffeine use: 1 cup of coffee daily   Lives in split level home with her husband   Social Drivers of Corporate investment banker Strain: Low Risk  (06/08/2023)   Overall Financial Resource Strain (CARDIA)    Difficulty of Paying Living Expenses: Not hard at all  Food Insecurity: No Food Insecurity (06/08/2023)   Hunger Vital Sign    Worried About Running Out of Food in the Last Year: Never true    Ran Out of Food in the Last Year: Never true  Transportation Needs: No Transportation Needs (06/08/2023)   PRAPARE - Transportation    Lack  of Transportation (Medical): No    Lack of Transportation (Non-Medical): No  Physical Activity: Inactive (06/08/2023)   Exercise Vital Sign    Days of Exercise per Week: 0 days    Minutes of Exercise per Session: 0 min  Stress: No Stress Concern Present (06/08/2023)   [employer] of Occupational Health - Occupational Stress Questionnaire    Feeling of Stress : Not at all  Social Connections: Moderately Integrated (06/08/2023)   Social Connection and Isolation Panel [NHANES]    Frequency of Communication with Friends and Family: More than three times a week    Frequency of Social Gatherings with Friends and Family: Three times a week    Attends Religious Services: More than 4 times per year     Active Member of Clubs or Organizations: No    Attends Banker Meetings: Never    Marital Status: Married   Past Surgical History:  Procedure Laterality Date   BREAST ENHANCEMENT SURGERY  1975   BREAST IMPLANT REMOVAL Bilateral 04/23/2016   Procedure: REMOVALBILATERAL BREAST IMPLANTS;  Surgeon: Peggye Form, DO;  Location: [facility];  Service: Plastics;  Laterality: Bilateral;   CHOLECYSTECTOMY  1973   ESOPHAGOGASTRODUODENOSCOPY N/A 10/25/2020   Procedure: ESOPHAGOGASTRODUODENOSCOPY (EGD);  Surgeon: Vida Rigger, MD;  Location: [facility];  Service: Endoscopy;  Laterality: N/A;  enteroscopy   HAND SURGERY  08/2012; 11/2012   HOT HEMOSTASIS N/A 10/25/2020   Procedure: HOT HEMOSTASIS (ARGON PLASMA COAGULATION/BICAP);  Surgeon: Vida Rigger, MD;  Location: [facility];  Service: Endoscopy;  Laterality: N/A;   INTRAOPERATIVE TRANSTHORACIC ECHOCARDIOGRAM N/A 05/13/2022   Procedure: INTRAOPERATIVE TRANSTHORACIC ECHOCARDIOGRAM;  Surgeon: Kathleene Hazel, MD;  [facility]: [facility];  Service: Open Heart Surgery;  Laterality: N/A;   INTRAOPERATIVE TRANSTHORACIC ECHOCARDIOGRAM N/A 05/20/2022   Procedure: INTRAOPERATIVE TRANSTHORACIC ECHOCARDIOGRAM;  Surgeon: Orbie Pyo, MD;  Location: [facility];  Service: Open Heart Surgery;  Laterality: N/A;   IR RADIOLOGIST EVAL & MGMT  07/27/2017   IR SACROPLASTY BILATERAL  07/31/2017   IR VERTEBROPLASTY CERV/THOR BX INC UNI/BIL INC/INJECT/IMAGING  03/13/2020   IR VERTEBROPLASTY EA ADDL (T&LS) BX INC UNI/BIL INC INJECT/IMAGING  03/14/2020   MELANOMA EXCISION     at 31 yrs of age   ORIF HIP FRACTURE Right 02/22/2014   Procedure: OPEN REDUCTION INTERNAL FIXATION RIGHT HIP;  Surgeon: Darreld Mclean, MD;  Location: [facility];  Service: Orthopedics;  Laterality: Right;   PACEMAKER IMPLANT N/A 05/27/2022   Procedure: PACEMAKER IMPLANT;  Surgeon: Lanier Prude, MD;  Location: [facility];  Service: Cardiovascular;   Laterality: N/A;   RIGHT HEART CATH N/A 01/16/2023   Procedure: RIGHT HEART CATH;  Surgeon: Dorthula Nettles, DO;  [facility];  Service: Cardiovascular;  Laterality: N/A;   RIGHT HEART CATH AND CORONARY ANGIOGRAPHY N/A 03/28/2022   Procedure: RIGHT HEART CATH AND CORONARY ANGIOGRAPHY;  Surgeon: Orbie Pyo, MD;  [facility];  Service: Cardiovascular;  Laterality: N/A;   TOTAL ABDOMINAL HYSTERECTOMY  1974   TRANSCATHETER AORTIC VALVE REPLACEMENT, TRANSFEMORAL Left 05/13/2022   Procedure: Transcatheter Aortic Valve Replacement, Transfemoral;  Surgeon: Kathleene Hazel, MD;  [facility];  Service: Open Heart Surgery;  Laterality: Left;   TRANSCATHETER AORTIC VALVE REPLACEMENT, TRANSFEMORAL Left 05/20/2022   Procedure: Transcatheter Aortic Valve Replacement, Transfemoral using an Edwards SAPIEN 3 Ultra 23 MM Heart Valve;  Surgeon: Orbie Pyo, MD;  Location: [facility];  Service: Open Heart Surgery;  Laterality: Left;  Left Transfemoral   Past Surgical History:  Procedure Laterality Date   BREAST ENHANCEMENT SURGERY  1975   BREAST IMPLANT REMOVAL Bilateral 04/23/2016   Procedure: REMOVALBILATERAL BREAST IMPLANTS;  Surgeon: Peggye Form, DO;  Location: [facility];  Service: Plastics;  Laterality: Bilateral;   CHOLECYSTECTOMY  1973   ESOPHAGOGASTRODUODENOSCOPY N/A 10/25/2020   Procedure: ESOPHAGOGASTRODUODENOSCOPY (EGD);  Surgeon: Vida Rigger, MD;  Location: [facility];  Service: Endoscopy;  Laterality: N/A;  enteroscopy   HAND SURGERY  08/2012; 11/2012   HOT HEMOSTASIS N/A 10/25/2020   Procedure: HOT HEMOSTASIS (ARGON PLASMA COAGULATION/BICAP);  Surgeon: Vida Rigger, MD;  Location: [facility];  Service: Endoscopy;  Laterality: N/A;   INTRAOPERATIVE TRANSTHORACIC ECHOCARDIOGRAM N/A 05/13/2022   Procedure: INTRAOPERATIVE TRANSTHORACIC ECHOCARDIOGRAM;  Surgeon: Kathleene Hazel, MD;  [facility];   Service: Open Heart Surgery;  Laterality: N/A;   INTRAOPERATIVE TRANSTHORACIC ECHOCARDIOGRAM N/A 05/20/2022   Procedure: INTRAOPERATIVE TRANSTHORACIC ECHOCARDIOGRAM;  Surgeon: Orbie Pyo, MD;  Location: [facility] OR;  Service: Open Heart Surgery;  Laterality: N/A;   IR RADIOLOGIST EVAL & MGMT  07/27/2017   IR SACROPLASTY BILATERAL  07/31/2017   IR VERTEBROPLASTY CERV/THOR BX INC UNI/BIL INC/INJECT/IMAGING  03/13/2020   IR VERTEBROPLASTY EA ADDL (T&LS) BX INC UNI/BIL INC INJECT/IMAGING  03/14/2020   MELANOMA EXCISION     at 31 yrs of age   ORIF HIP FRACTURE Right 02/22/2014   Procedure: OPEN REDUCTION INTERNAL FIXATION RIGHT HIP;  Surgeon: Darreld Mclean, MD;  Location: [facility];  Service: Orthopedics;  Laterality: Right;   PACEMAKER IMPLANT N/A 05/27/2022   Procedure: PACEMAKER IMPLANT;  Surgeon: Lanier Prude, MD;  Location: [facility] CV LAB;  Service: Cardiovascular;  Laterality: N/A;   RIGHT HEART CATH N/A 01/16/2023   Procedure: RIGHT HEART CATH;  Surgeon: Dorthula Nettles, DO;  [facility];  Service: Cardiovascular;  Laterality: N/A;   RIGHT HEART CATH AND CORONARY ANGIOGRAPHY N/A 03/28/2022   Procedure: RIGHT HEART CATH AND CORONARY ANGIOGRAPHY;  Surgeon: Orbie Pyo, MD;  [facility];  Service: Cardiovascular;  Laterality: N/A;   TOTAL ABDOMINAL HYSTERECTOMY  1974   TRANSCATHETER AORTIC VALVE REPLACEMENT, TRANSFEMORAL Left 05/13/2022   Procedure: Transcatheter Aortic Valve Replacement, Transfemoral;  Surgeon: Kathleene Hazel, MD;  [facility];  Service: Open Heart Surgery;  Laterality: Left;   TRANSCATHETER AORTIC VALVE REPLACEMENT, TRANSFEMORAL Left 05/20/2022   Procedure: Transcatheter Aortic Valve Replacement, Transfemoral using an Edwards SAPIEN 3 Ultra 23 MM Heart Valve;  Surgeon: Orbie Pyo, MD;  Location[facility];  Service: Open Heart Surgery;  Laterality: Left;  Left Transfemoral   Past Medical History:  Diagnosis  Date   Cataract    Depression    GERD (gastroesophageal reflux disease)    Hyperlipidemia    Hypertension    Hypothyroidism    IBS (irritable bowel syndrome)    Osteoarthritis    Raynaud disease    S/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 Lynnette Caffey & Bartle   Scleroderma [facility])    Thyroid disease    hypothyroidism   BP (!) 153/63   Pulse 77   Ht 5\' 2"  (1.575 m)   Wt 103 lb (46.7 kg)   SpO2 97%   BMI 18.84 kg/m   Opioid Risk Score:   Fall Risk Score:  `1  Depression screen [facility] 2/9  07/20/2023   10:08 AM 06/08/2023    8:08 AM 05/29/2023   11:38 AM 03/30/2023   10:34 AM 02/09/2023    2:04 PM 08/18/2022    1:53 PM 06/02/2022    2:56 PM  Depression screen PHQ 2/9  Decreased Interest 0 0 0 2 0 0 0  Down, Depressed, Hopeless 0 0 0 2 0 0 0  PHQ - 2 Score 0 0 0 4 0 0 0  Altered sleeping 0 0 0 2 0 0   Tired, decreased energy 0 0 0 2 0 0   Change in appetite 0 0 0 2 0 0   Feeling bad or failure about yourself  0 0 0 0 0 0   Trouble concentrating 0 0 0 0 0 0   Moving slowly or fidgety/restless 0 0 0 0 0 0   Suicidal thoughts 0 0 0 0 0 0   PHQ-9 Score 0 0 0 10 0 0   Difficult doing work/chores Not difficult at all Not difficult at all Not difficult at all  Not difficult at all Not difficult at all     Review of Systems  Constitutional:  Positive for appetite change.       Poor appetite  Respiratory:  Positive for cough and shortness of breath.   Gastrointestinal:  Positive for nausea.  Neurological:  Positive for dizziness, tremors and weakness.  Psychiatric/Behavioral:         Depression, Anxiety  All other systems reviewed and are negative.      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  -Discussed benefits of exercise in reducing pain. -discussed that exercise can be helpful for fibromyalgia -discussed she does not like pain medicine but sometimes she needs it -referred to PT, discussed that she loved going there but the PT is too costly -recommended exercise bike at home.   -discussed mechanism of action of low dose naltrexone as an opioid receptor antagonist which stimulates your body's production of its own natural endogenous opioids, helping to decrease pain. Discussed that it can also decrease T cell response and thus be helpful in decreasing inflammation, and symptoms of brain fog, fatigue, anxiety, depression, and allergies. Discussed that this medication needs to be compounded at a compounding pharmacy and can more expensive. Discussed that I usually start at 1mg  and if this is not providing enough relief then I titrate upward on a monthly basis.    -food sensitivity testing ordered -Discussed current symptoms of pain and history of pain.  -Discussed benefits of exercise in reducing pain. -Discussed following foods that may reduce pain: 1) Ginger (especially studied for arthritis)- reduce leukotriene production to decrease inflammation 2) Blueberries- high in phytonutrients that decrease inflammation 3) Salmon- marine omega-3s reduce joint swelling and pain 4) Pumpkin seeds- reduce inflammation 5) dark chocolate- reduces inflammation 6) turmeric- reduces inflammation 7) tart cherries - reduce pain and stiffness 8) extra virgin olive oil - its compound olecanthal helps to block prostaglandins  9)  chili peppers- can be eaten or applied topically via capsaicin 10) mint- helpful for headache, muscle aches, joint pain, and itching 11) garlic- reduces inflammation  Link to further information on diet for chronic pain: http://www.bray.com/   Benefits of turmeric:  -Highly anti-inflammatory  -Increases antioxidants  -Improves memory, attention, brain disease  -Lowers risk of heart disease  -May help prevent cancer  -Decreases pain  -Alleviates depression  -Delays aging and decreases risk of chronic disease  -Consume with black pepper to increase absorption    Turmeric Milk Recipe:  1 cup milk  1 tsp turmeric  1 tsp cinnamon  1 tsp grated ginger (optional)  Black pepper (boosts the anti-inflammatory properties of turmeric).  1 tsp honey    2) Right rotator cuff tear: -discussed her plan for surgery.  -discussed that she does not want to try physical therapy since it hurts so much  3) Bilateral leg weakness -referred for PT  4) pacemaker: -discussed her recent pacemaker placement  5) Food sensitivity/IBS: -discussed her food sensitivities and recommended avoiding wheat, dairy, and corn, discussed that she likes mac and cheese -discussed her history of IBS -discussed that she loves bread and it is hard for her to start this -discussed that she likes sourdough -discussed that she finds gluten free bread disgusting -reviewed her dietary preferences and discussed good and bad choices -discussed that she has not been doing well with avoiding inflammatory foods  6) Fall: -discussed that is usually 2/2 to dehydration -reviewed current medications  7) Tremor: -discussed that she has not seen a neurologist -discussed that this started after her cardiac surgeries in 11/23  8) Anxiety: -continue klonopin 0.5mg  BID prn, discussed that if she wants to wean off she can break the morning dose  in hald -discussed that she has been through behavioral therapy for a long time -Discussed exercise and meditation as tools to decrease anxiety. -Recommended Down Dog Yoga app -Discussed spending time outdoors. -Discussed positive re-framing of anxiety.  -Discussed the following foods that have been show to reduce anxiety: 1) [place] nuts, mushrooms, soy beans due to their high selenium content. Upper limit of toxicity of selenium is 460mcg/day so no more than 3-4 [place] nuts per day.  2) Fatty fish such as salmon, mackerel, sardines, trout, and herring- high in omega-3 fatty acids 3) Eggs- increases serotonin and dopamine 4) Pumpkin seeds- high in omega-3 fatty acids 5) dark chocolate- high in flavanols that increase blood flow to brain 6) turmeric- take with black pepper to increase absorption 7) chamomile tea- antioxidant and anti-inflammatory properties 8) yogurt without sugar- supports gut-brain axis 9) green tea- contains L- theanine 10) blueberries- high in vitamin C and antioxidants 11) [place]- high in tryptophan which gets converted to serotonin 12) bell peppers- rich in vitamin C and antioxidants 13) citrus fruits- rich in vitamin C and antioxidants 14) almonds- high in vitamin E and healthy fats 15) chia seeds- high in omega-3 fatty acids  9) Debility s/p cardiac surgeries: -discuss cardiac rehab but it would be expensive for her  10) Dizziness: -discussed that this could be secondary to her cardiac medications -continue drinking water, recommended celery juice  11) Hip fracture: -discussed that her hip has never been the same since her hip fracture -discussed that lower extremity strength was improving with therapy  12) Left shoulder pain: -discussed that she does not want to take pain medication -Discussed Qutenza as an option for neuropathic pain control. Discussed that this is a capsaicin patch, stronger  than capsaicin cream. Discussed that it is currently approved  for diabetic peripheral neuropathy and post-herpetic neuralgia, but that it has also shown benefit in treating other forms of neuropathy. Provided patient with link to site to learn more about the patch: https://www.clark.biz/. Discussed that the patch would be placed in office and benefits usually last 3 months. Discussed that unintended exposure to capsaicin can cause severe irritation of eyes, mucous membranes, respiratory tract, and skin, but that Qutenza is a local treatment and does not have the systemic side effects of other nerve medications. Discussed that there may be pain, itching, erythema, and decreased sensory function associated with the application of Qutenza. Side effects usually subside within 1 week. A cold pack of analgesic medications can help with these side effects. Blood pressure can also be increased due to pain associated with administration of the patch.   13) History of breast implant removal: -discussed that she had silica breast implants and their removal can cause diffusion of the silica in her body -recommended drinking celery juice daily  14) HTN: -Advised checking BP daily at home and logging results to bring into follow-up appointment with PCP and myself. -Reviewed BP meds today.  -Advised regarding healthy foods that can help lower blood pressure and provided with a list: 1) citrus foods- high in vitamins and minerals 2) salmon and other fatty fish - reduces inflammation and oxylipins 3) swiss chard (leafy green)- high level of nitrates 4) pumpkin seeds- one of the best natural sources of magnesium 5) Beans and lentils- high in fiber, magnesium, and potassium 6) Berries- high in flavonoids 7) Amaranth (whole grain, can be cooked similarly to rice and oats)- high in magnesium and fiber 8) Pistachios- even more effective at reducing BP than other nuts 9) Carrots- high in phenolic compounds that relax blood vessels and reduce inflammation 10) Celery- contain  phthalides that relax tissues of arterial walls 11) Tomatoes- can also improve cholesterol and reduce risk of heart disease 12) Broccoli- good source of magnesium, calcium, and potassium 13) Greek yogurt: high in potassium and calcium 14) Herbs and spices: Celery seed, cilantro, saffron, lemongrass, black cumin, ginseng, cinnamon, cardamom, sweet basil, and ginger 15) Chia and flax seeds- also help to lower cholesterol and blood sugar 16) Beets- high levels of nitrates that relax blood vessels  17) spinach and bananas- high in potassium  -Provided lise of supplements that can help with hypertension:  1) magnesium: one high quality brand is Bioptemizers since it contains all 7 types of magnesium, otherwise over the counter magnesium gluconate 400mg  is a good option 2) B vitamins 3) vitamin D 4) potassium 5) CoQ10 6) L-arginine 7) Vitamin C 8) Beetroot -Educated that goal BP is 120/80. -Made goal to incorporate some of the above foods into diet.     40 minutes spent in discussion of cardiac rehab, discussed that her dizziness could be secondary to her cardiac medications, discussed that she has been trying to avoid corn but her medications contain corn starch, discussed that she loves bread, discussed her BP and her three blood pressure medications can cause dizziness, discussed that PT was too expensive for her but that she knows the exercises, discussed Qutenza as an option for neuropathic pain control. Discussed that this is a capsaicin patch, stronger than capsaicin cream. Discussed that it is currently approved for diabetic peripheral neuropathy and post-herpetic neuralgia, but that it has also shown benefit in treating other forms of pain. Provided patient with link to site to learn more  about the patch: https://www.clark.biz/. Discussed that the patch would be placed in office and benefits usually last 3 months. Discussed that unintended exposure to capsaicin can cause severe irritation  of eyes, mucous membranes, respiratory tract, and skin, but that Qutenza is a local treatment and does not have the systemic side effects of other nerve medications. Discussed that there may be pain, itching, erythema, and decreased sensory function associated with the application of Qutenza. Side effects usually subside within 1 week. A cold pack of analgesic medications can help with these side effects. Blood pressure can also be increased due to pain associated with administration of the patch.

## 2023-10-12 NOTE — Addendum Note (Signed)
 Addended by: Horton Chin on: 10/12/2023 02:40 PM   Modules accepted: Orders

## 2023-10-14 LAB — TOXASSURE SELECT,+ANTIDEPR,UR

## 2023-10-28 ENCOUNTER — Inpatient Hospital Stay: Attending: Hematology

## 2023-10-28 DIAGNOSIS — D509 Iron deficiency anemia, unspecified: Secondary | ICD-10-CM | POA: Diagnosis not present

## 2023-10-28 DIAGNOSIS — D500 Iron deficiency anemia secondary to blood loss (chronic): Secondary | ICD-10-CM

## 2023-10-28 LAB — CBC
HCT: 38 % (ref 36.0–46.0)
Hemoglobin: 12.2 g/dL (ref 12.0–15.0)
MCH: 29.3 pg (ref 26.0–34.0)
MCHC: 32.1 g/dL (ref 30.0–36.0)
MCV: 91.1 fL (ref 80.0–100.0)
Platelets: 241 10*3/uL (ref 150–400)
RBC: 4.17 MIL/uL (ref 3.87–5.11)
RDW: 16.1 % — ABNORMAL HIGH (ref 11.5–15.5)
WBC: 6.9 10*3/uL (ref 4.0–10.5)
nRBC: 0 % (ref 0.0–0.2)

## 2023-10-28 LAB — SAMPLE TO BLOOD BANK

## 2023-11-03 NOTE — Progress Notes (Signed)
 Remote pacemaker transmission.

## 2023-11-22 ENCOUNTER — Emergency Department (HOSPITAL_BASED_OUTPATIENT_CLINIC_OR_DEPARTMENT_OTHER)

## 2023-11-22 ENCOUNTER — Other Ambulatory Visit: Payer: Self-pay

## 2023-11-22 ENCOUNTER — Encounter (HOSPITAL_BASED_OUTPATIENT_CLINIC_OR_DEPARTMENT_OTHER): Payer: Self-pay

## 2023-11-22 ENCOUNTER — Inpatient Hospital Stay (HOSPITAL_BASED_OUTPATIENT_CLINIC_OR_DEPARTMENT_OTHER)
Admission: EM | Admit: 2023-11-22 | Discharge: 2023-11-25 | DRG: 378 | Disposition: A | Discharge status: Home / Self Care | Attending: Internal Medicine | Admitting: Internal Medicine

## 2023-11-22 DIAGNOSIS — K449 Diaphragmatic hernia without obstruction or gangrene: Secondary | ICD-10-CM | POA: Diagnosis not present

## 2023-11-22 DIAGNOSIS — K589 Irritable bowel syndrome without diarrhea: Secondary | ICD-10-CM | POA: Diagnosis present

## 2023-11-22 DIAGNOSIS — Z883 Allergy status to other anti-infective agents status: Secondary | ICD-10-CM

## 2023-11-22 DIAGNOSIS — Z7982 Long term (current) use of aspirin: Secondary | ICD-10-CM

## 2023-11-22 DIAGNOSIS — I7300 Raynaud's syndrome without gangrene: Secondary | ICD-10-CM | POA: Diagnosis not present

## 2023-11-22 DIAGNOSIS — Z9049 Acquired absence of other specified parts of digestive tract: Secondary | ICD-10-CM

## 2023-11-22 DIAGNOSIS — K5521 Angiodysplasia of colon with hemorrhage: Principal | ICD-10-CM | POA: Diagnosis present

## 2023-11-22 DIAGNOSIS — Z79890 Hormone replacement therapy (postmenopausal): Secondary | ICD-10-CM

## 2023-11-22 DIAGNOSIS — I700 Atherosclerosis of aorta: Secondary | ICD-10-CM | POA: Diagnosis not present

## 2023-11-22 DIAGNOSIS — Z953 Presence of xenogenic heart valve: Secondary | ICD-10-CM

## 2023-11-22 DIAGNOSIS — Z885 Allergy status to narcotic agent status: Secondary | ICD-10-CM | POA: Diagnosis not present

## 2023-11-22 DIAGNOSIS — R1030 Lower abdominal pain, unspecified: Secondary | ICD-10-CM

## 2023-11-22 DIAGNOSIS — R519 Headache, unspecified: Secondary | ICD-10-CM | POA: Diagnosis not present

## 2023-11-22 DIAGNOSIS — M349 Systemic sclerosis, unspecified: Secondary | ICD-10-CM | POA: Diagnosis not present

## 2023-11-22 DIAGNOSIS — Z886 Allergy status to analgesic agent status: Secondary | ICD-10-CM | POA: Diagnosis not present

## 2023-11-22 DIAGNOSIS — Z79899 Other long term (current) drug therapy: Secondary | ICD-10-CM

## 2023-11-22 DIAGNOSIS — K625 Hemorrhage of anus and rectum: Secondary | ICD-10-CM | POA: Diagnosis not present

## 2023-11-22 DIAGNOSIS — Z90710 Acquired absence of both cervix and uterus: Secondary | ICD-10-CM | POA: Diagnosis not present

## 2023-11-22 DIAGNOSIS — I10 Essential (primary) hypertension: Secondary | ICD-10-CM | POA: Diagnosis present

## 2023-11-22 DIAGNOSIS — Z85820 Personal history of malignant melanoma of skin: Secondary | ICD-10-CM | POA: Diagnosis not present

## 2023-11-22 DIAGNOSIS — K922 Gastrointestinal hemorrhage, unspecified: Secondary | ICD-10-CM | POA: Diagnosis not present

## 2023-11-22 DIAGNOSIS — K219 Gastro-esophageal reflux disease without esophagitis: Secondary | ICD-10-CM | POA: Diagnosis not present

## 2023-11-22 DIAGNOSIS — M1990 Unspecified osteoarthritis, unspecified site: Secondary | ICD-10-CM | POA: Diagnosis not present

## 2023-11-22 DIAGNOSIS — F419 Anxiety disorder, unspecified: Secondary | ICD-10-CM | POA: Diagnosis present

## 2023-11-22 DIAGNOSIS — F32A Depression, unspecified: Secondary | ICD-10-CM | POA: Diagnosis present

## 2023-11-22 DIAGNOSIS — E785 Hyperlipidemia, unspecified: Secondary | ICD-10-CM | POA: Diagnosis present

## 2023-11-22 DIAGNOSIS — Z818 Family history of other mental and behavioral disorders: Secondary | ICD-10-CM

## 2023-11-22 DIAGNOSIS — D62 Acute posthemorrhagic anemia: Secondary | ICD-10-CM | POA: Diagnosis not present

## 2023-11-22 DIAGNOSIS — E039 Hypothyroidism, unspecified: Secondary | ICD-10-CM | POA: Diagnosis present

## 2023-11-22 DIAGNOSIS — I272 Pulmonary hypertension, unspecified: Secondary | ICD-10-CM | POA: Diagnosis not present

## 2023-11-22 DIAGNOSIS — Z882 Allergy status to sulfonamides status: Secondary | ICD-10-CM | POA: Diagnosis not present

## 2023-11-22 DIAGNOSIS — Z888 Allergy status to other drugs, medicaments and biological substances status: Secondary | ICD-10-CM

## 2023-11-22 LAB — COMPREHENSIVE METABOLIC PANEL WITH GFR
ALT: <5 U/L (ref 0–44)
AST: 13 U/L — ABNORMAL LOW (ref 15–41)
Albumin: 4.1 g/dL (ref 3.5–5.0)
Alkaline Phosphatase: 75 U/L (ref 38–126)
Anion gap: 13 (ref 5–15)
BUN: 21 mg/dL (ref 8–23)
CO2: 21 mmol/L — ABNORMAL LOW (ref 22–32)
Calcium: 9.3 mg/dL (ref 8.9–10.3)
Chloride: 105 mmol/L (ref 98–111)
Creatinine, Ser: 0.96 mg/dL (ref 0.44–1.00)
GFR, Estimated: 58 mL/min — ABNORMAL LOW (ref >60)
Glucose, Bld: 112 mg/dL — ABNORMAL HIGH (ref 70–99)
Potassium: 4.6 mmol/L (ref 3.5–5.1)
Sodium: 140 mmol/L (ref 135–145)
Total Bilirubin: <0.2 mg/dL (ref 0.0–1.2)
Total Protein: 7.1 g/dL (ref 6.5–8.1)

## 2023-11-22 LAB — CBC
HCT: 32.3 % — ABNORMAL LOW (ref 36.0–46.0)
Hemoglobin: 10.8 g/dL — ABNORMAL LOW (ref 12.0–15.0)
MCH: 30.9 pg (ref 26.0–34.0)
MCHC: 33.4 g/dL (ref 30.0–36.0)
MCV: 92.6 fL (ref 80.0–100.0)
Platelets: 255 10*3/uL (ref 150–400)
RBC: 3.49 MIL/uL — ABNORMAL LOW (ref 3.87–5.11)
RDW: 15.5 % (ref 11.5–15.5)
WBC: 8.4 10*3/uL (ref 4.0–10.5)
nRBC: 0 % (ref 0.0–0.2)

## 2023-11-22 LAB — LIPASE, BLOOD: Lipase: 34 U/L (ref 11–51)

## 2023-11-22 LAB — OCCULT BLOOD X 1 CARD TO LAB, STOOL: Fecal Occult Bld: POSITIVE — AB

## 2023-11-22 MED ORDER — IOHEXOL 350 MG/ML SOLN
100.0000 mL | Freq: Once | INTRAVENOUS | Status: AC | PRN
  Administered 2023-11-22: 80 mL via INTRAVENOUS

## 2023-11-22 NOTE — ED Provider Notes (Signed)
 [facility] EMERGENCY DEPARTMENT AT [facility] Provider Note   CSN: [id number] Arrival date & time: 11/22/23  2122     History  Chief Complaint  Patient presents with   Rectal Bleeding    Haley Trevino is a 83 y.o. female.  With a history of iron  deficiency anemia, TAVR and hemorrhoids who presents to the ED for GI bleeding.  Patient has felt generalized fatigue and weakness over the last couple days.  1 large bright red bloody bowel movement this evening.  Takes aspirin  daily no other anticoagulation due to bleeding in the past.  No hematemesis.  Some abdominal discomfort in the lower abdomen.  No urinary symptoms.   Rectal Bleeding      Home Medications Prior to Admission medications   Medication Sig Start Date End Date Taking? Authorizing Provider  acetaminophen  (TYLENOL ) 500 MG tablet Take 1,000 mg by mouth at bedtime.    [provider]  acidophilus (RISAQUAD) CAPS capsule Take 2 capsules by mouth daily. 05/21/23   Vann, Jessica U, DO  aspirin  EC 81 MG tablet Take 81 mg by mouth daily. Swallow whole.    [provider]  clonazePAM  (KLONOPIN ) 0.5 MG tablet Take 1 tablet (0.5 mg total) by mouth 2 (two) times daily as needed for anxiety. 08/10/23   Gaylyn Keas Mary-Margaret, FNP  ezetimibe  (ZETIA ) 10 MG tablet Take 1 tablet (10 mg total) by mouth daily. 08/10/23   Gaylyn Keas Mary-Margaret, FNP  FLUoxetine  (PROZAC ) 40 MG capsule Take 1 capsule (40 mg total) by mouth daily. 08/10/23   Gaylyn Keas Mary-Margaret, FNP  fluticasone  (FLONASE ) 50 MCG/ACT nasal spray PLACE 2 SPRAYS INTO BOTH NOSTRILS DAILY Patient taking differently: Place 2 sprays into both nostrils as needed. 03/16/23   Gaylyn Keas, Mary-Margaret, FNP  furosemide  (LASIX ) 20 MG tablet Take 1 tablet (20 mg total) by mouth daily as needed. 08/10/23   Gaylyn Keas Mary-Margaret, FNP  NIFEdipine  (PROCARDIA  XL/NIFEDICAL XL) 60 MG 24 hr tablet Take 1 tablet (60 mg total) by mouth daily. 08/10/23   Gaylyn Keas Mary-Margaret, FNP   NONFORMULARY OR COMPOUNDED ITEM Take 1 mg by mouth at bedtime. Low dose naltrexone Patient taking differently: Take 1 mg by mouth at bedtime. Low dose naltrexone Anti-inflammatory with strict diet to avoid inflammation 03/30/23   Raulkar, Keven Pel, MD  omeprazole  (PRILOSEC) 20 MG capsule TAKE 1 TABLET DAILY 08/10/23   Delfina Feller, FNP  PROLIA  60 MG/ML SOSY injection Inject 60 mg into the skin every 6 (six) months. 04/21/22   Gaylyn Keas, Mary-Margaret, FNP  RESTASIS  0.05 % ophthalmic emulsion Place 1 drop into both eyes 2 (two) times daily. 03/26/22   [provider]  sildenafil  (REVATIO ) 20 MG tablet Take 1 tablet (20 mg total) by mouth 2 (two) times daily. 08/10/23   Delfina Feller, FNP  SYNTHROID  75 MCG tablet TAKE (1) TABLET DAILY BE- FORE BREAKFAST. 08/10/23   Gaylyn Keas, Mary-Margaret, FNP  topiramate  (TOPAMAX ) 25 MG tablet Take 1 tablet (25 mg total) by mouth at bedtime as needed. 10/12/23   Raulkar, Keven Pel, MD      Allergies    Bactrim  [sulfamethoxazole -trimethoprim ], Dilaudid [hydromorphone hcl], Eliquis  [apixaban ], Acyclovir and related, Crestor  [rosuvastatin  calcium ], Gabapentin, Aleve [naproxen sodium], and Lyrica [pregabalin]    Review of Systems   Review of Systems  Gastrointestinal:  Positive for hematochezia.    Physical Exam Updated Vital Signs BP 127/61   Pulse 64   Temp 98.8 F (37.1 C) (Oral)   Resp 18   Ht 5\' 2"  (1.575 m)  Wt 45.4 kg   SpO2 99%   BMI 18.29 kg/m  Physical Exam Vitals and nursing note reviewed.  HENT:     Head: Normocephalic and atraumatic.  Eyes:     Pupils: Pupils are equal, round, and reactive to light.  Cardiovascular:     Rate and Rhythm: Normal rate and regular rhythm.  Pulmonary:     Effort: Pulmonary effort is normal.     Breath sounds: Normal breath sounds.  Abdominal:     Palpations: Abdomen is soft.     Tenderness: There is no abdominal tenderness.  Genitourinary:    Rectum: Guaiac result positive.      Comments: Hemorrhoids in 7:00 and 11 o'clock position without active bleeding or thrombosis Skin:    General: Skin is warm and dry.  Neurological:     Mental Status: She is alert.  Psychiatric:        Mood and Affect: Mood normal.     ED Results / Procedures / Treatments   Labs (all labs ordered are listed, but only abnormal results are displayed) Labs Reviewed  COMPREHENSIVE METABOLIC PANEL WITH GFR - Abnormal; Notable for the following components:      Result Value   CO2 21 (*)    Glucose, Bld 112 (*)    AST 13 (*)    GFR, Estimated 58 (*)    All other components within normal limits  CBC - Abnormal; Notable for the following components:   RBC 3.49 (*)    Hemoglobin 10.8 (*)    HCT 32.3 (*)    All other components within normal limits  OCCULT BLOOD X 1 CARD TO LAB, STOOL - Abnormal; Notable for the following components:   Fecal Occult Bld POSITIVE (*)    All other components within normal limits  LIPASE, BLOOD  URINALYSIS, ROUTINE W REFLEX MICROSCOPIC    EKG None  Radiology CT ANGIO GI BLEED Result Date: 11/22/2023 CLINICAL DATA:  Lower GI bleed EXAM: CTA ABDOMEN AND PELVIS WITHOUT AND WITH CONTRAST TECHNIQUE: Multidetector CT imaging of the abdomen and pelvis was performed using the standard protocol during bolus administration of intravenous contrast. Multiplanar reconstructed images and MIPs were obtained and reviewed to evaluate the vascular anatomy. RADIATION DOSE REDUCTION: This exam was performed according to the departmental dose-optimization program which includes automated exposure control, adjustment of the mA and/or kV according to patient size and/or use of iterative reconstruction technique. CONTRAST:  80mL OMNIPAQUE  IOHEXOL  350 MG/ML SOLN COMPARISON:  CT abdomen and pelvis 04/07/2022. FINDINGS: VASCULAR Aorta: Normal caliber aorta without aneurysm, dissection, vasculitis or significant stenosis. There is calcified atherosclerotic disease throughout the aorta.  Celiac: Patent without evidence of aneurysm, dissection, vasculitis or significant stenosis. SMA: Patent without evidence of aneurysm, dissection, vasculitis or significant stenosis. Renals: Both renal arteries are patent without evidence of aneurysm, dissection, vasculitis, fibromuscular dysplasia or significant stenosis. IMA: Patent without evidence of aneurysm, dissection, vasculitis or significant stenosis. Inflow: Patent without evidence of aneurysm, dissection, vasculitis or significant stenosis. Proximal Outflow: Bilateral common femoral and visualized portions of the superficial and profunda femoral arteries are patent without evidence of aneurysm, dissection, vasculitis or significant stenosis. Veins: No obvious venous abnormality within the limitations of this arterial phase study. Review of the MIP images confirms the above findings. NON-VASCULAR Lower chest: No acute abnormality. Hepatobiliary: There is mild intra and extrahepatic biliary ductal dilatation. Patient is status post cholecystectomy. No focal liver lesions are seen. Pancreas: Unremarkable. No pancreatic ductal dilatation or surrounding inflammatory changes. Spleen: Normal  in size without focal abnormality. Adrenals/Urinary Tract: Adrenal glands are unremarkable. Kidneys are normal, without renal calculi, focal lesion, or hydronephrosis. Bladder is unremarkable. Stomach/Bowel: No active gastrointestinal bleeding identified. There is a small hiatal hernia. Stomach is within normal limits. Appendix is not seen. No evidence of bowel wall thickening, distention, or inflammatory changes. Lymphatic: No enlarged lymph nodes are seen. Reproductive: Status post hysterectomy. No adnexal masses. Other: No abdominal wall hernia or abnormality. No abdominopelvic ascites. Musculoskeletal: Multilevel degenerative changes affect the spine. There sacroplasty changes bilaterally. Right-sided hip screw is present. IMPRESSION: 1. No evidence for active  gastrointestinal bleeding. 2. Aortic atherosclerosis. 3. No acute localizing process in the abdomen or pelvis. 4. Small hiatal hernia. Aortic Atherosclerosis (ICD10-I70.0). Electronically Signed   By: Tyron Gallon M.D.   On: 11/22/2023 23:52    Procedures Procedures    Medications Ordered in ED Medications  iohexol  (OMNIPAQUE ) 350 MG/ML injection 100 mL (80 mLs Intravenous Contrast Given 11/22/23 2338)    ED Course/ Medical Decision Making/ A&P Clinical Course as of 11/22/23 2359  Sun Nov 22, 2023  2325 IRafael Bun DO, am transitioning care of this patient to the oncoming provider pending CT a abdomen to look for GI bleed and disposition [MP]  2328 Stable 83 YOF with a chief complaint of BRBPR HX of IDA Getting CTA abdomen  pelvis HGB 12-10.8  [CC]  2329 Dr. Lavaughn Portland of GI scoped in 2022 [CC]    Clinical Course User Index [CC] Onetha Bile, MD [MP] Sallyanne Creamer, DO                                 Medical Decision Making 83 year old female with history as above presenting for concern of GI bleeding.  1 episode of bright red bloody stools.  Hemoccult positive stool on my exam.  No abdominal tenderness.  Hemodynamically stable.  Laboratory workup shows hemoglobin of 10.8.  Type and screen unavailable here but no indication for transfusion at this time.  Will obtain CT abdomen pelvis given that this was just a few hours ago to look for active bleed.  Amount and/or Complexity of Data Reviewed Labs: ordered. Radiology: ordered.  Risk Prescription drug management.           Final Clinical Impression(s) / ED Diagnoses Final diagnoses:  None    Rx / DC Orders ED Discharge Orders     None         Sallyanne Creamer, DO 11/22/23 2359

## 2023-11-22 NOTE — ED Notes (Addendum)
Pt resting comfortably. Awaiting CT scan.

## 2023-11-22 NOTE — ED Notes (Signed)
 ED Provider at bedside.

## 2023-11-22 NOTE — ED Notes (Signed)
 Pt back from CT tolerated well. Placed back on monitor. Awaiting urine sample and CT results.

## 2023-11-22 NOTE — ED Triage Notes (Signed)
 Pt POV reporting abd pain and indigestion x1 week and bright red blood in stool tonight. Hx anemia, multiple blood transfusions in the past.

## 2023-11-22 NOTE — ED Provider Notes (Signed)
 Care of patient received from prior provider at 4:41 AM, please see their note for complete H/P and care plan.  Received handoff per ED course.  Clinical Course as of 11/23/23 0441  Sun Nov 22, 2023  2325 Teresia Fennel DO, am transitioning care of this patient to the oncoming provider pending CT a abdomen to look for GI bleed and disposition [MP]  2328 Stable 83 YOF with a chief complaint of BRBPR HX of IDA Getting CTA abdomen  pelvis HGB 12-10.8  [CC]  2329 Dr. Lavaughn Portland of GI scoped in 2022 [CC]  Mon Nov 23, 2023  0001 CT ANGIO GI BLEED NAA [CC]    Clinical Course User Index [CC] Onetha Bile, MD [MP] Sallyanne Creamer, DO    Reassessment: CTA with no acute abnormality.  Consulted GI and medicine for ongoing care and management.     Onetha Bile, MD 11/23/23 [id number]

## 2023-11-23 DIAGNOSIS — K219 Gastro-esophageal reflux disease without esophagitis: Secondary | ICD-10-CM | POA: Diagnosis present

## 2023-11-23 DIAGNOSIS — Z79890 Hormone replacement therapy (postmenopausal): Secondary | ICD-10-CM | POA: Diagnosis not present

## 2023-11-23 DIAGNOSIS — K922 Gastrointestinal hemorrhage, unspecified: Secondary | ICD-10-CM | POA: Diagnosis present

## 2023-11-23 DIAGNOSIS — Z90710 Acquired absence of both cervix and uterus: Secondary | ICD-10-CM | POA: Diagnosis not present

## 2023-11-23 DIAGNOSIS — I10 Essential (primary) hypertension: Secondary | ICD-10-CM | POA: Diagnosis present

## 2023-11-23 DIAGNOSIS — Z882 Allergy status to sulfonamides status: Secondary | ICD-10-CM | POA: Diagnosis not present

## 2023-11-23 DIAGNOSIS — E785 Hyperlipidemia, unspecified: Secondary | ICD-10-CM | POA: Diagnosis present

## 2023-11-23 DIAGNOSIS — M1990 Unspecified osteoarthritis, unspecified site: Secondary | ICD-10-CM | POA: Diagnosis present

## 2023-11-23 DIAGNOSIS — Z886 Allergy status to analgesic agent status: Secondary | ICD-10-CM | POA: Diagnosis not present

## 2023-11-23 DIAGNOSIS — I7300 Raynaud's syndrome without gangrene: Secondary | ICD-10-CM | POA: Diagnosis present

## 2023-11-23 DIAGNOSIS — Z883 Allergy status to other anti-infective agents status: Secondary | ICD-10-CM | POA: Diagnosis not present

## 2023-11-23 DIAGNOSIS — E039 Hypothyroidism, unspecified: Secondary | ICD-10-CM | POA: Diagnosis present

## 2023-11-23 DIAGNOSIS — K5521 Angiodysplasia of colon with hemorrhage: Secondary | ICD-10-CM | POA: Diagnosis present

## 2023-11-23 DIAGNOSIS — F32A Depression, unspecified: Secondary | ICD-10-CM | POA: Diagnosis present

## 2023-11-23 DIAGNOSIS — R1030 Lower abdominal pain, unspecified: Secondary | ICD-10-CM

## 2023-11-23 DIAGNOSIS — Z953 Presence of xenogenic heart valve: Secondary | ICD-10-CM | POA: Diagnosis not present

## 2023-11-23 DIAGNOSIS — Z885 Allergy status to narcotic agent status: Secondary | ICD-10-CM | POA: Diagnosis not present

## 2023-11-23 DIAGNOSIS — I272 Pulmonary hypertension, unspecified: Secondary | ICD-10-CM | POA: Diagnosis present

## 2023-11-23 DIAGNOSIS — K589 Irritable bowel syndrome without diarrhea: Secondary | ICD-10-CM | POA: Diagnosis present

## 2023-11-23 DIAGNOSIS — Z85820 Personal history of malignant melanoma of skin: Secondary | ICD-10-CM | POA: Diagnosis not present

## 2023-11-23 DIAGNOSIS — Z818 Family history of other mental and behavioral disorders: Secondary | ICD-10-CM | POA: Diagnosis not present

## 2023-11-23 DIAGNOSIS — K625 Hemorrhage of anus and rectum: Secondary | ICD-10-CM | POA: Diagnosis present

## 2023-11-23 DIAGNOSIS — R519 Headache, unspecified: Secondary | ICD-10-CM | POA: Diagnosis not present

## 2023-11-23 DIAGNOSIS — F419 Anxiety disorder, unspecified: Secondary | ICD-10-CM | POA: Diagnosis present

## 2023-11-23 DIAGNOSIS — M349 Systemic sclerosis, unspecified: Secondary | ICD-10-CM | POA: Diagnosis present

## 2023-11-23 DIAGNOSIS — D62 Acute posthemorrhagic anemia: Secondary | ICD-10-CM | POA: Diagnosis present

## 2023-11-23 DIAGNOSIS — Z9049 Acquired absence of other specified parts of digestive tract: Secondary | ICD-10-CM | POA: Diagnosis not present

## 2023-11-23 LAB — URINALYSIS, ROUTINE W REFLEX MICROSCOPIC
Bacteria, UA: NONE SEEN
Bilirubin Urine: NEGATIVE
Glucose, UA: NEGATIVE mg/dL
Ketones, ur: NEGATIVE mg/dL
Leukocytes,Ua: NEGATIVE
Nitrite: NEGATIVE
Protein, ur: NEGATIVE mg/dL
Specific Gravity, Urine: 1.039 — ABNORMAL HIGH (ref 1.005–1.030)
pH: 6 (ref 5.0–8.0)

## 2023-11-23 LAB — CBC
HCT: 29.7 % — ABNORMAL LOW (ref 36.0–46.0)
HCT: 30 % — ABNORMAL LOW (ref 36.0–46.0)
Hemoglobin: 9.6 g/dL — ABNORMAL LOW (ref 12.0–15.0)
Hemoglobin: 9.8 g/dL — ABNORMAL LOW (ref 12.0–15.0)
MCH: 29.3 pg (ref 26.0–34.0)
MCH: 29.3 pg (ref 26.0–34.0)
MCHC: 32.3 g/dL (ref 30.0–36.0)
MCHC: 32.7 g/dL (ref 30.0–36.0)
MCV: 90.5 fL (ref 80.0–100.0)
MCV: 89.8 fL (ref 80.0–100.0)
Platelets: 207 10*3/uL (ref 150–400)
Platelets: 205 10*3/uL (ref 150–400)
RBC: 3.28 MIL/uL — ABNORMAL LOW (ref 3.87–5.11)
RBC: 3.34 MIL/uL — ABNORMAL LOW (ref 3.87–5.11)
RDW: 15.2 % (ref 11.5–15.5)
RDW: 15.2 % (ref 11.5–15.5)
WBC: 5.6 10*3/uL (ref 4.0–10.5)
WBC: 6.8 10*3/uL (ref 4.0–10.5)
nRBC: 0 % (ref 0.0–0.2)
nRBC: 0 % (ref 0.0–0.2)

## 2023-11-23 LAB — HEMOGLOBIN AND HEMATOCRIT, BLOOD
HCT: 27.8 % — ABNORMAL LOW (ref 36.0–46.0)
HCT: 29.7 % — ABNORMAL LOW (ref 36.0–46.0)
HCT: 27.4 % — ABNORMAL LOW (ref 36.0–46.0)
Hemoglobin: 9.3 g/dL — ABNORMAL LOW (ref 12.0–15.0)
Hemoglobin: 9.6 g/dL — ABNORMAL LOW (ref 12.0–15.0)
Hemoglobin: 8.9 g/dL — ABNORMAL LOW (ref 12.0–15.0)

## 2023-11-23 LAB — TYPE AND SCREEN
ABO/RH(D): O POS
Antibody Screen: NEGATIVE

## 2023-11-23 LAB — PROTIME-INR
INR: 1.1 (ref 0.8–1.2)
Prothrombin Time: 13.9 s (ref 11.4–15.2)

## 2023-11-23 LAB — APTT: aPTT: 34 s (ref 24–36)

## 2023-11-23 MED ORDER — PROCHLORPERAZINE EDISYLATE 10 MG/2ML IJ SOLN: 5.0000 mg | Freq: Four times a day (QID) | INTRAMUSCULAR | Status: DC | PRN

## 2023-11-23 MED ORDER — SODIUM CHLORIDE 0.9 % IV SOLN: INTRAVENOUS | Status: AC

## 2023-11-23 MED ORDER — EZETIMIBE 10 MG PO TABS
10.0000 mg | ORAL_TABLET | Freq: Every day | ORAL | Status: DC
  Administered 2023-11-23 – 2023-11-25 (×3): 10 mg via ORAL
  Filled 2023-11-23 (×3): qty 1

## 2023-11-23 MED ORDER — SILDENAFIL CITRATE 20 MG PO TABS
20.0000 mg | ORAL_TABLET | Freq: Two times a day (BID) | ORAL | Status: DC
  Administered 2023-11-23 – 2023-11-25 (×4): 20 mg via ORAL
  Filled 2023-11-23 (×4): qty 1

## 2023-11-23 MED ORDER — SODIUM CHLORIDE 0.9% FLUSH
3.0000 mL | Freq: Two times a day (BID) | INTRAVENOUS | Status: DC
  Administered 2023-11-24 – 2023-11-25 (×3): 3 mL via INTRAVENOUS

## 2023-11-23 MED ORDER — LEVOTHYROXINE SODIUM 75 MCG PO TABS
75.0000 ug | ORAL_TABLET | ORAL | Status: DC
  Administered 2023-11-24 – 2023-11-25 (×2): 75 ug via ORAL
  Filled 2023-11-23 (×2): qty 1

## 2023-11-23 MED ORDER — ACETAMINOPHEN 325 MG PO TABS
650.0000 mg | ORAL_TABLET | Freq: Four times a day (QID) | ORAL | Status: DC | PRN
  Administered 2023-11-24: 650 mg via ORAL
  Filled 2023-11-23 (×2): qty 2

## 2023-11-23 MED ORDER — POLYETHYLENE GLYCOL 3350 17 G PO PACK: 17.0000 g | PACK | Freq: Every day | ORAL | Status: DC | PRN

## 2023-11-23 MED ORDER — CYCLOSPORINE 0.05 % OP EMUL
1.0000 [drp] | Freq: Two times a day (BID) | OPHTHALMIC | Status: DC
  Administered 2023-11-23 – 2023-11-25 (×4): 1 [drp] via OPHTHALMIC
  Filled 2023-11-23 (×4): qty 30

## 2023-11-23 MED ORDER — FLUOXETINE HCL 20 MG PO CAPS
40.0000 mg | ORAL_CAPSULE | Freq: Every day | ORAL | Status: DC
  Administered 2023-11-23 – 2023-11-25 (×3): 40 mg via ORAL
  Filled 2023-11-23: qty 4
  Filled 2023-11-23: qty 2
  Filled 2023-11-23: qty 4
  Filled 2023-11-23: qty 2

## 2023-11-23 MED ORDER — PANTOPRAZOLE SODIUM 40 MG IV SOLR
40.0000 mg | Freq: Two times a day (BID) | INTRAVENOUS | Status: DC
  Administered 2023-11-23 – 2023-11-25 (×6): 40 mg via INTRAVENOUS
  Filled 2023-11-23 (×6): qty 10

## 2023-11-23 MED ORDER — TOPIRAMATE 25 MG PO TABS
25.0000 mg | ORAL_TABLET | Freq: Every evening | ORAL | Status: DC | PRN
  Administered 2023-11-23 – 2023-11-24 (×2): 25 mg via ORAL
  Filled 2023-11-23 (×3): qty 1

## 2023-11-23 MED ORDER — CLONAZEPAM 0.5 MG PO TABS
0.5000 mg | ORAL_TABLET | Freq: Two times a day (BID) | ORAL | Status: DC | PRN
  Administered 2023-11-23 – 2023-11-24 (×2): 0.5 mg via ORAL
  Filled 2023-11-23 (×2): qty 1

## 2023-11-23 MED ORDER — NIFEDIPINE ER OSMOTIC RELEASE 60 MG PO TB24
60.0000 mg | ORAL_TABLET | Freq: Every day | ORAL | Status: DC
  Administered 2023-11-23 – 2023-11-25 (×3): 60 mg via ORAL
  Filled 2023-11-23 (×3): qty 1

## 2023-11-23 MED ORDER — FUROSEMIDE 20 MG PO TABS
20.0000 mg | ORAL_TABLET | Freq: Every day | ORAL | Status: DC
  Administered 2023-11-23: 20 mg via ORAL
  Filled 2023-11-23 (×2): qty 1

## 2023-11-23 NOTE — Assessment & Plan Note (Signed)
 C/w nifedpine XL

## 2023-11-23 NOTE — Assessment & Plan Note (Addendum)
 Mild. X 7 days.  Abdominal exam is benign without associated fever leukocytosis vomiting or diarrhea. CT abd not actionable. acetaminophen 

## 2023-11-23 NOTE — ED Notes (Signed)
 [facility] at bedside to transport patient to inpatient room.

## 2023-11-23 NOTE — Assessment & Plan Note (Signed)
 C/w restasis  eye drop. Patiet has GERD/heart burn chronic. Changed omeprazole  to iv ppi

## 2023-11-23 NOTE — Assessment & Plan Note (Signed)
 BRBPR abotu 5 PM yesterday. Sincgle episdoes. No recurnet. VSS. Check inr ptt . CBC shows about 1gm hgb loss not at transfusion threhold. Hold aspirin . CT CT+ not actionable. Clears PO. Monitorin. Apprecitea GI input in chart.

## 2023-11-23 NOTE — Assessment & Plan Note (Addendum)
 Continue with fluoxetine  and clonazepam .  Patient uses Topamax  for as needed migraine. PDMP reviewed

## 2023-11-23 NOTE — Assessment & Plan Note (Signed)
 C/w revatio  and nifedipine  XL. Patinet uses lasix  PRN. However, given gentle ongoing hydration will order standing. Curently euvolemic.

## 2023-11-23 NOTE — Progress Notes (Signed)
 Pt had 2 BMs this evening. Stool was dark, and toilet water bright ried. MD Cleda Curly notified, picture of first BM in chart.

## 2023-11-23 NOTE — Consult Note (Signed)
 Referring Provider: Dr. Del Favia Primary Care Physician:  Delfina Feller, FNP Primary Gastroenterologist:  Dr. Lavaughn Portland  Reason for Consultation:  Rectal bleeding  HPI: DELAINA FETSCH is a 83 y.o. female with acute onset of large amount of bright red blood per rectum that filled the bowl and was preceded by formed stool. No further bleeding. Having lower abdominal pain as well. Chronic history of dizziness and denies any worsening of it. Denies N/V. History of small bowel and cecal AVMs. Last colonoscopy in 2014. Husband at bedside. Hgb 9.6 (12.2). CT angio negative for active GI bleed.  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 Lorie Rook & Bartle   Scleroderma [facility])    Thyroid  disease    hypothyroidism    Past Surgical History:  Procedure Laterality Date   BREAST ENHANCEMENT SURGERY  1975   BREAST IMPLANT REMOVAL Bilateral 04/23/2016   Procedure: REMOVALBILATERAL BREAST IMPLANTS;  Surgeon: Thornell Flirt, DO;  Location: [facility];  Service: Plastics;  Laterality: Bilateral;   CHOLECYSTECTOMY  1973   ESOPHAGOGASTRODUODENOSCOPY N/A 10/25/2020   Procedure: ESOPHAGOGASTRODUODENOSCOPY (EGD);  Surgeon: Ozell Blunt, MD;  Location: [facility];  Service: Endoscopy;  Laterality: N/A;  enteroscopy   HAND SURGERY  08/2012; 11/2012   HOT HEMOSTASIS N/A 10/25/2020   Procedure: HOT HEMOSTASIS (ARGON PLASMA COAGULATION/BICAP);  Surgeon: Ozell Blunt, MD;  Location: [facility];  Service: Endoscopy;  Laterality: N/A;   INTRAOPERATIVE TRANSTHORACIC ECHOCARDIOGRAM N/A 05/13/2022   Procedure: INTRAOPERATIVE TRANSTHORACIC ECHOCARDIOGRAM;  Surgeon: Odie Benne, MD;  Location: [facility];  Service: Open Heart Surgery;  Laterality: N/A;   INTRAOPERATIVE TRANSTHORACIC ECHOCARDIOGRAM N/A 05/20/2022   Procedure: INTRAOPERATIVE TRANSTHORACIC ECHOCARDIOGRAM;  Surgeon: Kyra Phy, MD;  Location: [facility] OR;  Service: Open Heart Surgery;  Laterality: N/A;   IR RADIOLOGIST EVAL & MGMT  07/27/2017   IR SACROPLASTY BILATERAL  07/31/2017   IR VERTEBROPLASTY CERV/THOR BX INC UNI/BIL INC/INJECT/IMAGING  03/13/2020   IR VERTEBROPLASTY EA ADDL (T&LS) BX INC UNI/BIL INC INJECT/IMAGING  03/14/2020   MELANOMA EXCISION     at 31 yrs of age   ORIF HIP FRACTURE Right 02/22/2014   Procedure: OPEN REDUCTION INTERNAL FIXATION RIGHT HIP;  Surgeon: Pleasant Brilliant, MD;  Location: [facility];  Service: Orthopedics;  Laterality: Right;   PACEMAKER IMPLANT N/A 05/27/2022   Procedure: PACEMAKER IMPLANT;  Surgeon: Boyce Byes, MD;  Location: [facility];  Service: Cardiovascular;  Laterality: N/A;   RIGHT HEART CATH N/A 01/16/2023   Procedure: RIGHT HEART CATH;  Surgeon: Alwin Baars, DO;  Location: [facility];  Service: Cardiovascular;  Laterality: N/A;   RIGHT HEART CATH AND CORONARY ANGIOGRAPHY N/A 03/28/2022   Procedure: RIGHT HEART CATH AND CORONARY ANGIOGRAPHY;  Surgeon: Kyra Phy, MD;  Location: [facility];  Service: Cardiovascular;  Laterality: N/A;   TOTAL ABDOMINAL HYSTERECTOMY  1974   TRANSCATHETER AORTIC VALVE REPLACEMENT, TRANSFEMORAL Left 05/13/2022   Procedure: Transcatheter Aortic Valve Replacement, Transfemoral;  Surgeon: Odie Benne, MD;  Location: [facility];  Service: Open Heart Surgery;  Laterality: Left;   TRANSCATHETER  AORTIC VALVE REPLACEMENT, TRANSFEMORAL Left 05/20/2022   Procedure: Transcatheter Aortic Valve Replacement, Transfemoral using an Edwards SAPIEN 3 Ultra 23 MM Heart Valve;  Surgeon: Thukkani, Arun K, MD;  Location: [facility] OR;  Service: Open Heart Surgery;  Laterality: Left;  Left Transfemoral     Prior to Admission medications   Medication Sig Start Date End Date Taking? Authorizing Provider  acetaminophen  (TYLENOL ) 500 MG tablet Take 1,000 mg by mouth at bedtime.   Yes [provider]  aspirin  EC 81 MG tablet Take 81 mg by mouth daily. Swallow whole.   Yes [provider]  clonazePAM  (KLONOPIN ) 0.5 MG tablet Take 1 tablet (0.5 mg total) by mouth 2 (two) times daily as needed for anxiety. 08/10/23  Yes Martin, Mary-Margaret, FNP  ezetimibe  (ZETIA ) 10 MG tablet Take 1 tablet (10 mg total) by mouth daily. 08/10/23  Yes Martin, Mary-Margaret, FNP  FLUoxetine  (PROZAC ) 40 MG capsule Take 1 capsule (40 mg total) by mouth daily. 08/10/23  Yes Martin, Mary-Margaret, FNP  fluticasone  (FLONASE ) 50 MCG/ACT nasal spray PLACE 2 SPRAYS INTO BOTH NOSTRILS DAILY Patient taking differently: Place 2 sprays into both nostrils as needed for allergies. 03/16/23  Yes Martin, Mary-Margaret, FNP  furosemide  (LASIX ) 20 MG tablet Take 1 tablet (20 mg total) by mouth daily as needed. Patient taking differently: Take 20 mg by mouth daily as needed for fluid. 08/10/23  Yes Gaylyn Keas, Mary-Margaret, FNP  NIFEdipine  (PROCARDIA  XL/NIFEDICAL XL) 60 MG 24 hr tablet Take 1 tablet (60 mg total) by mouth daily. 08/10/23  Yes Martin, Mary-Margaret, FNP  omeprazole  (PRILOSEC) 20 MG capsule TAKE 1 TABLET DAILY Patient taking differently: Take 20 mg by mouth daily. TAKE 1 TABLET DAILY 08/10/23  Yes Gaylyn Keas, Mary-Margaret, FNP  RESTASIS  0.05 % ophthalmic emulsion Place 1 drop into both eyes 2 (two) times daily. 03/26/22  Yes [provider]  sildenafil  (REVATIO ) 20 MG tablet Take 1 tablet (20 mg total) by mouth 2 (two) times daily. 08/10/23  Yes Gaylyn Keas, Mary-Margaret, FNP  SYNTHROID  75 MCG tablet TAKE (1) TABLET DAILY BE- FORE BREAKFAST. Patient taking differently: Take 75 mcg by mouth every morning. TAKE (1) TABLET DAILY BE- FORE BREAKFAST. 08/10/23  Yes Gaylyn Keas, Mary-Margaret, FNP  topiramate  (TOPAMAX ) 25 MG tablet Take  1 tablet (25 mg total) by mouth at bedtime as needed. 10/12/23  Yes Raulkar, Keven Pel, MD    Scheduled Meds:  pantoprazole  (PROTONIX ) IV  40 mg Intravenous BID   Continuous Infusions:  sodium chloride  75 mL/hr at 11/23/23 0912   PRN Meds:.acetaminophen , prochlorperazine   Allergies as of 11/22/2023 - Review Complete 11/22/2023  Allergen Reaction Noted   Bactrim  [sulfamethoxazole -trimethoprim ] Nausea And Vomiting 11/27/2021   Dilaudid [hydromorphone hcl] Anaphylaxis 02/21/2014   Eliquis  [apixaban ] Other (See Comments) 08/21/2022   Acyclovir and related Nausea And Vomiting 05/08/2017   Crestor  [rosuvastatin  calcium ] Other (See Comments) 07/27/2018   Gabapentin Nausea And Vomiting and Swelling 12/08/2014   Aleve [naproxen sodium] Rash 07/20/2015   Lyrica [pregabalin] Other (See Comments) 09/20/2014    Family History  Problem Relation Age of Onset   Pancreatic cancer Father    Raynaud syndrome Father    Cancer Father        pancreatic   Rashes / Skin problems Daughter        possibly scleraderma   Hip fracture Mother    Mental illness Mother        attempted suicide at 68 yo    Social History   Socioeconomic History   Marital status: Married  Spouse name: Not on file   Number of children: 2   Years of education: Not on file   Highest education level: Some college, no degree  Occupational History   Occupation: retired Holiday representative at [employer]  Tobacco Use   Smoking status: Never   Smokeless tobacco: Never   Tobacco comments:    passive tobacco smoke exposure as child  Vaping Use   Vaping status: Never Used  Substance and Sexual Activity   Alcohol use: No   Drug use: No   Sexual activity: Not Currently    Birth control/protection: Surgical  Other Topics Concern   Not on file  Social History Narrative   Regular exercise: walkingCaffeine use: 1 cup of coffee daily   Lives in split level home with her husband   Social Drivers of Corporate investment banker  Strain: Low Risk  (06/08/2023)   Overall Financial Resource Strain (CARDIA)    Difficulty of Paying Living Expenses: Not hard at all  Food Insecurity: No Food Insecurity (06/08/2023)   Hunger Vital Sign    Worried About Running Out of Food in the Last Year: Never true    Ran Out of Food in the Last Year: Never true  Transportation Needs: No Transportation Needs (06/08/2023)   PRAPARE - Administrator, Civil Service (Medical): No    Lack of Transportation (Non-Medical): No  Physical Activity: Inactive (06/08/2023)   Exercise Vital Sign    Days of Exercise per Week: 0 days    Minutes of Exercise per Session: 0 min  Stress: No Stress Concern Present (06/08/2023)   [employer] of Occupational Health - Occupational Stress Questionnaire    Feeling of Stress : Not at all  Social Connections: Moderately Integrated (06/08/2023)   Social Connection and Isolation Panel [NHANES]    Frequency of Communication with Friends and Family: More than three times a week    Frequency of Social Gatherings with Friends and Family: Three times a week    Attends Religious Services: More than 4 times per year    Active Member of Clubs or Organizations: No    Attends Banker Meetings: Never    Marital Status: Married  Catering manager Violence: Not At Risk (06/08/2023)   Humiliation, Afraid, Rape, and Kick questionnaire    Fear of Current or Ex-Partner: No    Emotionally Abused: No    Physically Abused: No    Sexually Abused: No    Review of Systems: All negative except as stated above in HPI.  Physical Exam: Vital signs: Vitals:   11/23/23 1000 11/23/23 1100  BP:    Pulse: 68 73  Resp:    Temp:    SpO2: 96% 94%   Last BM Date : 11/23/23 General:   lethargic, elderly, thin, no acute distress, pleasant  Head: normocephalic, atraumatic Eyes: anicteric sclera ENT: oropharynx clear Neck: supple, nontender Lungs:  Clear throughout to auscultation.   No wheezes, crackles, or  rhonchi. No acute distress. Heart:  Regular rate and rhythm; no murmurs, clicks, rubs,  or gallops. Abdomen: lower abdominal tenderness with guarding, soft, nondistended, +BS  Rectal:  Deferred Ext: no edema  GI:  Lab Results: Recent Labs    11/22/23 2150 11/23/23 0419 11/23/23 0925  WBC 8.4  --   --   HGB 10.8* 9.3* 9.6*  HCT 32.3* 27.8* 29.7*  PLT 255  --   --    BMET Recent Labs    11/22/23 2150  NA 140  K 4.6  CL 105  CO2 21*  GLUCOSE 112*  BUN 21  CREATININE 0.96  CALCIUM  9.3   LFT Recent Labs    11/22/23 2150  PROT 7.1  ALBUMIN 4.1  AST 13*  ALT <5  ALKPHOS 75  BILITOT <0.2   PT/INR No results for input(s): "LABPROT", "INR" in the last 72 hours.   Studies/Results:   Impression/Plan: Rectal bleeding likely lower in origin. History of small bowel and cecal AVMs. If bleeding recurs, then may need an updated colonoscopy otherwise manage conservatively. Clear liquid diet. Will follow.    LOS: 0 days   Haley Trevino  11/23/2023, 12:29 PM  Questions please call [phone]

## 2023-11-23 NOTE — Assessment & Plan Note (Addendum)
 Continue with zetia

## 2023-11-23 NOTE — H&P (Signed)
 History and Physical    Patient: Haley Trevino ZOX:[id number] DOB: 1940-09-29 DOA: 11/22/2023 DOS: the patient was seen and examined on 11/23/2023 PCP: Delfina Feller, FNP  Patient coming from: Home[facility] ER  Chief Complaint:  Chief Complaint  Patient presents with   Rectal Bleeding   HPI: [name] is a 83 y.o. female with medical history significant of medical issues as listed below.  She has previously had 1 episode of GI bleeding while she was started on Plavix.  I reviewed the last cardiology note as well as family medicine note from February of this year, I am not certain why patient is on aspirin .  Patient seems to have been in her usual state of health till about a week ago since when she has been reporting some lower abdominal discomfort/slight crampy pain.  No associated fever or vomiting.  Patient has been able to maintain p.o. hydration and diet.  No diarrhea.  At approximately 5 PM yesterday patient had abrupt onset of sensation of needing to have a bowel movement and when she went to the bathroom, she reports passing bright red blood per rectum.  There was very little fecal material.  There was no associated chest pain or shortness of breath or loss of consciousness or presyncope.  Patient arrived to [facility] for further evaluation.  Patient has not had any further bright red blood per rectum since then.  Patient remained vitally stable in the ER with no finding of bright red blood per rectum or melena on rectal exam done by my ER colleague late last evening.  Patient is transferred to [facility] inpatient telemetry unit.  Patient is hungry.  Medical evaluation is sought.  Review of Systems: As mentioned in the history of present illness. All other systems reviewed and are negative. Past Medical History:  Diagnosis Date   Cataract    Depression    GERD (gastroesophageal reflux disease)    Hyperlipidemia    Hypertension    Hypothyroidism    IBS  (irritable bowel syndrome)    Osteoarthritis    Raynaud disease    S/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 Lorie Rook & Bartle   Scleroderma [facility])    Thyroid  disease    hypothyroidism   Past Surgical History:  Procedure Laterality Date   BREAST ENHANCEMENT SURGERY  1975   BREAST IMPLANT REMOVAL Bilateral 04/23/2016   Procedure: REMOVALBILATERAL BREAST IMPLANTS;  Surgeon: Thornell Flirt, DO;  Location: [facility];  Service: Plastics;  Laterality: Bilateral;   CHOLECYSTECTOMY  1973   ESOPHAGOGASTRODUODENOSCOPY N/A 10/25/2020   Procedure: ESOPHAGOGASTRODUODENOSCOPY (EGD);  Surgeon: Ozell Blunt, MD;  Location: [facility];  Service: Endoscopy;  Laterality: N/A;  enteroscopy   HAND SURGERY  08/2012; 11/2012   HOT HEMOSTASIS N/A 10/25/2020   Procedure: HOT HEMOSTASIS (ARGON PLASMA COAGULATION/BICAP);  Surgeon: Ozell Blunt, MD;  Location: [facility];  Service: Endoscopy;  Laterality: N/A;   INTRAOPERATIVE TRANSTHORACIC ECHOCARDIOGRAM N/A 05/13/2022   Procedure: INTRAOPERATIVE TRANSTHORACIC ECHOCARDIOGRAM;  Surgeon: Odie Benne, MD;  Location: [facility];  Service: Open Heart Surgery;  Laterality: N/A;   INTRAOPERATIVE TRANSTHORACIC ECHOCARDIOGRAM N/A 05/20/2022   Procedure: INTRAOPERATIVE TRANSTHORACIC ECHOCARDIOGRAM;  Surgeon: Kyra Phy, MD;  Location: [facility] OR;  Service: Open Heart Surgery;  Laterality: N/A;   IR RADIOLOGIST EVAL & MGMT  07/27/2017   IR SACROPLASTY BILATERAL  07/31/2017   IR VERTEBROPLASTY  CERV/THOR BX INC UNI/BIL INC/INJECT/IMAGING  03/13/2020   IR VERTEBROPLASTY EA ADDL (T&LS) BX INC UNI/BIL INC INJECT/IMAGING  03/14/2020   MELANOMA EXCISION     at 31 yrs of age   ORIF HIP FRACTURE Right 02/22/2014   Procedure: OPEN REDUCTION INTERNAL FIXATION RIGHT HIP;  Surgeon: Pleasant Brilliant, MD;  Location: [facility];  Service: Orthopedics;  Laterality: Right;   PACEMAKER  IMPLANT N/A 05/27/2022   Procedure: PACEMAKER IMPLANT;  Surgeon: Boyce Byes, MD;  Location: [facility] INVASIVE CV LAB;  Service: Cardiovascular;  Laterality: N/A;   RIGHT HEART CATH N/A 01/16/2023   Procedure: RIGHT HEART CATH;  Surgeon: Alwin Baars, DO;  Location: [facility];  Service: Cardiovascular;  Laterality: N/A;   RIGHT HEART CATH AND CORONARY ANGIOGRAPHY N/A 03/28/2022   Procedure: RIGHT HEART CATH AND CORONARY ANGIOGRAPHY;  Surgeon: Kyra Phy, MD;  Location: [facility];  Service: Cardiovascular;  Laterality: N/A;   TOTAL ABDOMINAL HYSTERECTOMY  1974   TRANSCATHETER AORTIC VALVE REPLACEMENT, TRANSFEMORAL Left 05/13/2022   Procedure: Transcatheter Aortic Valve Replacement, Transfemoral;  Surgeon: Odie Benne, MD;  Location: [facility];  Service: Open Heart Surgery;  Laterality: Left;   TRANSCATHETER AORTIC VALVE REPLACEMENT, TRANSFEMORAL Left 05/20/2022   Procedure: Transcatheter Aortic Valve Replacement, Transfemoral using an Edwards SAPIEN 3 Ultra 23 MM Heart Valve;  Surgeon: Kyra Phy, MD;  Location: [facility] OR;  Service: Open Heart Surgery;  Laterality: Left;  Left Transfemoral   Social History:  reports that she has never smoked. She has never used smokeless tobacco. She reports that she does not drink alcohol and does not use drugs.  Allergies  Allergen Reactions   Bactrim  [Sulfamethoxazole -Trimethoprim ] Nausea And Vomiting   Dilaudid [Hydromorphone Hcl] Anaphylaxis   Eliquis  [Apixaban ] Other (See Comments)    Caused bleed   Acyclovir And Related Nausea And Vomiting   Crestor  [Rosuvastatin  Calcium ] Other (See Comments)    Muscle pain   Gabapentin Nausea And Vomiting and Swelling    Swelling in feet and in legs   Aleve [Naproxen Sodium] Rash   Lyrica [Pregabalin] Other (See Comments)    Swelling in feet, and in legs    Family History  Problem Relation Age of Onset   Pancreatic cancer Father    Raynaud syndrome Father     Cancer Father        pancreatic   Rashes / Skin problems Daughter        possibly scleraderma   Hip fracture Mother    Mental illness Mother        attempted suicide at 49 yo    Prior to Admission medications   Medication Sig Start Date End Date Taking? Authorizing Provider  acetaminophen  (TYLENOL ) 500 MG tablet Take 1,000 mg by mouth at bedtime.   Yes [provider]  aspirin  EC 81 MG tablet Take 81 mg by mouth daily. Swallow whole.   Yes [provider]  clonazePAM  (KLONOPIN ) 0.5 MG tablet Take 1 tablet (0.5 mg total) by mouth 2 (two) times daily as needed for anxiety. 08/10/23  Yes Martin, Mary-Margaret, FNP  ezetimibe  (ZETIA ) 10 MG tablet Take 1 tablet (10 mg total) by mouth daily. 08/10/23  Yes Gaylyn Keas, Mary-Margaret, FNP  FLUoxetine  (PROZAC ) 40 MG capsule Take 1 capsule (40 mg total) by mouth daily. 08/10/23  Yes Martin, Mary-Margaret, FNP  fluticasone  (FLONASE ) 50 MCG/ACT nasal spray PLACE 2 SPRAYS INTO BOTH NOSTRILS DAILY Patient taking differently: Place 2 sprays into both nostrils as  needed for allergies. 03/16/23  Yes Martin, Mary-Margaret, FNP  furosemide  (LASIX ) 20 MG tablet Take 1 tablet (20 mg total) by mouth daily as needed. Patient taking differently: Take 20 mg by mouth daily as needed for fluid. 08/10/23  Yes Gaylyn Keas, Mary-Margaret, FNP  NIFEdipine  (PROCARDIA  XL/NIFEDICAL XL) 60 MG 24 hr tablet Take 1 tablet (60 mg total) by mouth daily. 08/10/23  Yes Martin, Mary-Margaret, FNP  omeprazole  (PRILOSEC) 20 MG capsule TAKE 1 TABLET DAILY Patient taking differently: Take 20 mg by mouth daily. TAKE 1 TABLET DAILY 08/10/23  Yes Gaylyn Keas, Mary-Margaret, FNP  RESTASIS  0.05 % ophthalmic emulsion Place 1 drop into both eyes 2 (two) times daily. 03/26/22  Yes [provider]  sildenafil  (REVATIO ) 20 MG tablet Take 1 tablet (20 mg total) by mouth 2 (two) times daily. 08/10/23  Yes Gaylyn Keas, Mary-Margaret, FNP  SYNTHROID  75 MCG tablet TAKE (1) TABLET DAILY BE- FORE  BREAKFAST. Patient taking differently: Take 75 mcg by mouth every morning. TAKE (1) TABLET DAILY BE- FORE BREAKFAST. 08/10/23  Yes Gaylyn Keas, Mary-Margaret, FNP  topiramate  (TOPAMAX ) 25 MG tablet Take 1 tablet (25 mg total) by mouth at bedtime as needed. 10/12/23  Yes Liam Redhead, MD    Physical Exam: Vitals:   11/23/23 0907 11/23/23 1000 11/23/23 1100 11/23/23 1241  BP: (!) 144/131   (!) 136/56  Pulse:  68 73 66  Resp:    18  Temp:    98.5 F (36.9 C)  TempSrc:    Oral  SpO2:  96% 94% 95%  Weight:      Height:       General: Patient is alert and awake, chronically hard of hearing, gives a coherent account of her symptoms.  Husband at the bedside.  No distress Respiratory exam: Bilateral intravesicular Cardiovascular exam S1-S2 normal Abdomen all quadrants soft nontender bowel sounds normal extremities warm without edema without focal deficit. Data Reviewed:  Labs on Admission:  Results for orders placed or performed during the hospital encounter of 11/22/23 (from the past 24 hours)  Lipase, blood     Status: None   Collection Time: 11/22/23  9:50 PM  Result Value Ref Range   Lipase 34 11 - 51 U/L  Comprehensive metabolic panel     Status: Abnormal   Collection Time: 11/22/23  9:50 PM  Result Value Ref Range   Sodium 140 135 - 145 mmol/L   Potassium 4.6 3.5 - 5.1 mmol/L   Chloride 105 98 - 111 mmol/L   CO2 21 (L) 22 - 32 mmol/L   Glucose, Bld 112 (H) 70 - 99 mg/dL   BUN 21 8 - 23 mg/dL   Creatinine, Ser 4.09 0.44 - 1.00 mg/dL   Calcium  9.3 8.9 - 10.3 mg/dL   Total Protein 7.1 6.5 - 8.1 g/dL   Albumin 4.1 3.5 - 5.0 g/dL   AST 13 (L) 15 - 41 U/L   ALT <5 0 - 44 U/L   Alkaline Phosphatase 75 38 - 126 U/L   Total Bilirubin <0.2 0.0 - 1.2 mg/dL   GFR, Estimated 58 (L) >60 mL/min   Anion gap 13 5 - 15  CBC     Status: Abnormal   Collection Time: 11/22/23  9:50 PM  Result Value Ref Range   WBC 8.4 4.0 - 10.5 K/uL   RBC 3.49 (L) 3.87 - 5.11 MIL/uL   Hemoglobin 10.8 (L)  12.0 - 15.0 g/dL   HCT 81.1 (L) 91.4 - 78.2 %   MCV 92.6 80.0 -  100.0 fL   MCH 30.9 26.0 - 34.0 pg   MCHC 33.4 30.0 - 36.0 g/dL   RDW 16.1 09.6 - 04.5 %   Platelets 255 150 - 400 K/uL   nRBC 0.0 0.0 - 0.2 %  Urinalysis, Routine w reflex microscopic -Urine, Clean Catch     Status: Abnormal   Collection Time: 11/22/23  9:50 PM  Result Value Ref Range   Color, Urine COLORLESS (A) YELLOW   APPearance CLEAR CLEAR   Specific Gravity, Urine 1.039 (H) 1.005 - 1.030   pH 6.0 5.0 - 8.0   Glucose, UA NEGATIVE NEGATIVE mg/dL   Hgb urine dipstick TRACE (A) NEGATIVE   Bilirubin Urine NEGATIVE NEGATIVE   Ketones, ur NEGATIVE NEGATIVE mg/dL   Protein, ur NEGATIVE NEGATIVE mg/dL   Nitrite NEGATIVE NEGATIVE   Leukocytes,Ua NEGATIVE NEGATIVE   RBC / HPF 0-5 0 - 5 RBC/hpf   WBC, UA 0-5 0 - 5 WBC/hpf   Bacteria, UA NONE SEEN NONE SEEN   Squamous Epithelial / HPF 0-5 0 - 5 /HPF   Mucus PRESENT   Occult blood card to lab, stool     Status: Abnormal   Collection Time: 11/22/23 11:05 PM  Result Value Ref Range   Fecal Occult Bld POSITIVE (A) NEGATIVE  Hemoglobin and hematocrit, blood     Status: Abnormal   Collection Time: 11/23/23  4:19 AM  Result Value Ref Range   Hemoglobin 9.3 (L) 12.0 - 15.0 g/dL   HCT 40.9 (L) 81.1 - 91.4 %  Hemoglobin and hematocrit, blood     Status: Abnormal   Collection Time: 11/23/23  9:25 AM  Result Value Ref Range   Hemoglobin 9.6 (L) 12.0 - 15.0 g/dL   HCT 78.2 (L) 95.6 - 21.3 %  Hemoglobin and hematocrit, blood     Status: Abnormal   Collection Time: 11/23/23  1:56 PM  Result Value Ref Range   Hemoglobin 8.9 (L) 12.0 - 15.0 g/dL   HCT 08.6 (L) 57.8 - 46.9 %   Basic Metabolic Panel: Recent Labs  Lab 11/22/23 2150  NA 140  K 4.6  CL 105  CO2 21*  GLUCOSE 112*  BUN 21  CREATININE 0.96  CALCIUM  9.3   Liver Function Tests: Recent Labs  Lab 11/22/23 2150  AST 13*  ALT <5  ALKPHOS 75  BILITOT <0.2  PROT 7.1  ALBUMIN 4.1   Recent Labs  Lab  11/22/23 2150  LIPASE 34   No results for input(s): "AMMONIA" in the last 168 hours. CBC: Recent Labs  Lab 11/22/23 2150 11/23/23 0419 11/23/23 0925 11/23/23 1356  WBC 8.4  --   --   --   HGB 10.8* 9.3* 9.6* 8.9*  HCT 32.3* 27.8* 29.7* 27.4*  MCV 92.6  --   --   --   PLT 255  --   --   --    Cardiac Enzymes: No results for input(s): "CKTOTAL", "CKMB", "CKMBINDEX", "TROPONINIHS" in the last 168 hours.  BNP (last 3 results) No results for input(s): "PROBNP" in the last 8760 hours. CBG: No results for input(s): "GLUCAP" in the last 168 hours.  Radiological Exams on Admission:  CT ANGIO GI BLEED Result Date: 11/22/2023 CLINICAL DATA:  Lower GI bleed EXAM: CTA ABDOMEN AND PELVIS WITHOUT AND WITH CONTRAST TECHNIQUE: Multidetector CT imaging of the abdomen and pelvis was performed using the standard protocol during bolus administration of intravenous contrast. Multiplanar reconstructed images and MIPs were obtained and reviewed to evaluate the vascular  anatomy. RADIATION DOSE REDUCTION: This exam was performed according to the departmental dose-optimization program which includes automated exposure control, adjustment of the mA and/or kV according to patient size and/or use of iterative reconstruction technique. CONTRAST:  80mL OMNIPAQUE  IOHEXOL  350 MG/ML SOLN COMPARISON:  CT abdomen and pelvis 04/07/2022. FINDINGS: VASCULAR Aorta: Normal caliber aorta without aneurysm, dissection, vasculitis or significant stenosis. There is calcified atherosclerotic disease throughout the aorta. Celiac: Patent without evidence of aneurysm, dissection, vasculitis or significant stenosis. SMA: Patent without evidence of aneurysm, dissection, vasculitis or significant stenosis. Renals: Both renal arteries are patent without evidence of aneurysm, dissection, vasculitis, fibromuscular dysplasia or significant stenosis. IMA: Patent without evidence of aneurysm, dissection, vasculitis or significant stenosis.  Inflow: Patent without evidence of aneurysm, dissection, vasculitis or significant stenosis. Proximal Outflow: Bilateral common femoral and visualized portions of the superficial and profunda femoral arteries are patent without evidence of aneurysm, dissection, vasculitis or significant stenosis. Veins: No obvious venous abnormality within the limitations of this arterial phase study. Review of the MIP images confirms the above findings. NON-VASCULAR Lower chest: No acute abnormality. Hepatobiliary: There is mild intra and extrahepatic biliary ductal dilatation. Patient is status post cholecystectomy. No focal liver lesions are seen. Pancreas: Unremarkable. No pancreatic ductal dilatation or surrounding inflammatory changes. Spleen: Normal in size without focal abnormality. Adrenals/Urinary Tract: Adrenal glands are unremarkable. Kidneys are normal, without renal calculi, focal lesion, or hydronephrosis. Bladder is unremarkable. Stomach/Bowel: No active gastrointestinal bleeding identified. There is a small hiatal hernia. Stomach is within normal limits. Appendix is not seen. No evidence of bowel wall thickening, distention, or inflammatory changes. Lymphatic: No enlarged lymph nodes are seen. Reproductive: Status post hysterectomy. No adnexal masses. Other: No abdominal wall hernia or abnormality. No abdominopelvic ascites. Musculoskeletal: Multilevel degenerative changes affect the spine. There sacroplasty changes bilaterally. Right-sided hip screw is present. IMPRESSION: 1. No evidence for active gastrointestinal bleeding. 2. Aortic atherosclerosis. 3. No acute localizing process in the abdomen or pelvis. 4. Small hiatal hernia. Aortic Atherosclerosis (ICD10-I70.0). Electronically Signed   By: Tyron Gallon M.D.   On: 11/22/2023 23:52     No intake/output data recorded. Total I/O In: 463.5 [I.V.:463.5] Out: -    Assessment and Plan: * GI bleed BRBPR abotu 5 PM yesterday. Sincgle episdoes. No recurnet.  VSS. Check inr ptt . CBC shows about 1gm hgb loss not at transfusion threhold. Hold aspirin . CT CT+ not actionable. Clears PO. Monitorin. Apprecitea GI input in chart.  Lower abdominal pain Mild. X 7 days.  Abdominal exam is benign without associated fever leukocytosis vomiting or diarrhea. CT abd not actionable. acetaminophen   Pulmonary HTN (HCC) C/w revatio  and nifedipine  XL. Patinet uses lasix  PRN. However, given gentle ongoing hydration will order standing. Curently euvolemic.  Anxiety Continue with fluoxetine  and clonazepam .  Patient uses Topamax  for as needed migraine. PDMP reviewed  Hyperlipidemia with target LDL less than 100 Continue with zetia   Hypertension C/w nifedpine XL  Hypothyroidism C/w synthroid   SCLERODERMA C/w restasis  eye drop. Patiet has GERD/heart burn chronic. Changed omeprazole  to iv ppi  Ppx - SCD    Advance Care Planning:   Code Status: Full Code   Consults: GI  Family Communication: husband at bedside. Discussed in detail.  Severity of Illness: The appropriate patient status for this patient is INPATIENT. Inpatient status is judged to be reasonable and necessary in order to provide the required intensity of service to ensure the patient's safety. The patient's presenting symptoms, physical exam findings, and initial radiographic and laboratory data in  the context of their chronic comorbidities is felt to place them at high risk for further clinical deterioration. Furthermore, it is not anticipated that the patient will be medically stable for discharge from the hospital within 2 midnights of admission.   * I certify that at the point of admission it is my clinical judgment that the patient will require inpatient hospital care spanning beyond 2 midnights from the point of admission due to high intensity of service, high risk for further deterioration and high frequency of surveillance required.*  Author: Bennie Brave, MD 11/23/2023 3:00 PM  For on call  review [email or web address].

## 2023-11-23 NOTE — Assessment & Plan Note (Signed)
 C/w synthroid

## 2023-11-24 ENCOUNTER — Inpatient Hospital Stay (HOSPITAL_COMMUNITY)

## 2023-11-24 DIAGNOSIS — K922 Gastrointestinal hemorrhage, unspecified: Secondary | ICD-10-CM | POA: Diagnosis not present

## 2023-11-24 LAB — COMPREHENSIVE METABOLIC PANEL WITH GFR
ALT: 20 U/L (ref 0–44)
AST: 62 U/L — ABNORMAL HIGH (ref 15–41)
Albumin: 3.5 g/dL (ref 3.5–5.0)
Alkaline Phosphatase: 69 U/L (ref 38–126)
Anion gap: 10 (ref 5–15)
BUN: 12 mg/dL (ref 8–23)
CO2: 20 mmol/L — ABNORMAL LOW (ref 22–32)
Calcium: 8.5 mg/dL — ABNORMAL LOW (ref 8.9–10.3)
Chloride: 106 mmol/L (ref 98–111)
Creatinine, Ser: 0.88 mg/dL (ref 0.44–1.00)
GFR, Estimated: >60 mL/min (ref >60)
Glucose, Bld: 103 mg/dL — ABNORMAL HIGH (ref 70–99)
Potassium: 3.8 mmol/L (ref 3.5–5.1)
Sodium: 136 mmol/L (ref 135–145)
Total Bilirubin: 0.4 mg/dL (ref 0.0–1.2)
Total Protein: 6.6 g/dL (ref 6.5–8.1)

## 2023-11-24 LAB — CBC
HCT: 29.9 % — ABNORMAL LOW (ref 36.0–46.0)
Hemoglobin: 9.8 g/dL — ABNORMAL LOW (ref 12.0–15.0)
MCH: 29.4 pg (ref 26.0–34.0)
MCHC: 32.8 g/dL (ref 30.0–36.0)
MCV: 89.8 fL (ref 80.0–100.0)
Platelets: 206 10*3/uL (ref 150–400)
RBC: 3.33 MIL/uL — ABNORMAL LOW (ref 3.87–5.11)
RDW: 15.2 % (ref 11.5–15.5)
WBC: 8.7 10*3/uL (ref 4.0–10.5)
nRBC: 0 % (ref 0.0–0.2)

## 2023-11-24 LAB — MAGNESIUM: Magnesium: 1.8 mg/dL (ref 1.7–2.4)

## 2023-11-24 LAB — LIPASE, BLOOD: Lipase: 27 U/L (ref 11–51)

## 2023-11-24 MED ORDER — DIPHENHYDRAMINE HCL 25 MG PO CAPS
25.0000 mg | ORAL_CAPSULE | Freq: Once | ORAL | Status: AC
  Administered 2023-11-24: 25 mg via ORAL
  Filled 2023-11-24: qty 1

## 2023-11-24 MED ORDER — OXYCODONE-ACETAMINOPHEN 5-325 MG PO TABS
1.0000 | ORAL_TABLET | Freq: Once | ORAL | Status: DC | PRN
Start: 2023-11-24 — End: 2023-11-24

## 2023-11-24 MED ORDER — FAMOTIDINE IN NACL 20-0.9 MG/50ML-% IV SOLN
20.0000 mg | Freq: Once | INTRAVENOUS | Status: AC
  Administered 2023-11-24: 20 mg via INTRAVENOUS
  Filled 2023-11-24: qty 50

## 2023-11-24 MED ORDER — OXYCODONE-ACETAMINOPHEN 5-325 MG PO TABS
1.0000 | ORAL_TABLET | Freq: Once | ORAL | Status: AC | PRN
  Administered 2023-11-24: 1 via ORAL
  Filled 2023-11-24: qty 1

## 2023-11-24 NOTE — Progress Notes (Addendum)
 [facility] night cross cover note:   I was notified by the patient's RN that the patient is complaining of some residual headache after receiving a dose of her existing prn Topamax .  Patient does not feel that prn acetaminophen  as a monotherapy will be effective in addressing her headache, and requests additional oral pain medications at this time.  I subsequently ordered Percocet 5/325 mg p.o. x 1 dose prn for pain/headache.   Of note, in the setting of her presenting acute GI bleed, she does not appear to be a candidate for NSAIDs.   Update: following the above dose of Percocet, the patient's headache has resolved, but she has simply developed some chest tightness in substernal to right-sided distribution, without associated symptoms.  No report of any associated shortness breath, wheezing, stridor. No report of lipid or tongue swelling, no any difficulty swallowing  The patient reports that while she is previously taking Percocet without known side effect or adverse reaction, that she has a history of similar chest discomfort when taking codeine. Patient requests a dose of benadryl .  Current vital signs appear stable, including afebrile, HR 83, most recent blood pressure 163/82, respiratory rate 18, oxygen saturation 99% on room air.  EKG obtained and demonstrates sinus rhythm with PACs, heart rate 88, and no evidence of T wave or ST changes, including no evidence of ST elevation.  Will also check CXR. As chest discomfort is more location on right side, I've changed the patient's morning BMP to CMP, and also added a lipase level to the morning labs.  Differential also includes potential esophageal spasm versus GERD in the setting of timing of onset of this discomfort after taking dose of Percocet.  The patient is already on twice daily dosing of IV Protonix .  Will add one-time dose of IV Pepcid . no overt evidence of an allergic reaction at this time, including no evidence of anaphylactic response.   However, per patient request, will order a one-time dose of oral Benadryl .  Particular the context of the patient's presenting acute gastrointestinal bleed, will refrain from any systemic corticosteroids at this time.    Camelia Cavalier, DO Hospitalist

## 2023-11-24 NOTE — Progress Notes (Signed)
 [facility] Progress Note  Haley Trevino 83 y.o. 1941-02-27   Subjective: Two small stools with red blood seen yesterday. Denies abdominal pain, N/V. Tolerating clear liquid diet. Family in room.  Objective: Vital signs: Vitals:   11/24/23 0700 11/24/23 1216  BP:  (!) 109/54  Pulse:  83  Resp: 14 15  Temp: 98 F (36.7 C) 97.8 F (36.6 C)  SpO2:      Physical Exam: Gen: alert, no acute distress, elderly, thin, pleasant HEENT: anicteric sclera CV: RRR Chest: CTA B Abd: soft, nontender, nondistended, +BS Ext: no edema  Lab Results: Recent Labs    11/22/23 2150 11/24/23 0641  NA 140 136  K 4.6 3.8  CL 105 106  CO2 21* 20*  GLUCOSE 112* 103*  BUN 21 12  CREATININE 0.96 0.88  CALCIUM  9.3 8.5*  MG  --  1.8   Recent Labs    11/22/23 2150 11/24/23 0641  AST 13* 62*  ALT <5 20  ALKPHOS 75 69  BILITOT <0.2 0.4  PROT 7.1 6.6  ALBUMIN 4.1 3.5   Recent Labs    11/23/23 2118 11/24/23 0641  WBC 6.8 8.7  HGB 9.8* 9.8*  HCT 30.0* 29.9*  MCV 89.8 89.8  PLT 205 206      Assessment/Plan: Resolving GI bleed - suspect diverticular. Hgb stable at 9.8. Full liquid diet and advance as tolerated. If stable, then ok for d/c tomorrow from GI standpoint. Will sign off. Call if questions.   Haley Trevino 11/24/2023, 12:53 PM  Questions please call [phone] ID: Haley Trevino, female   DOB: [birth year], 82 y.o.   MRN: [id number]

## 2023-11-24 NOTE — Evaluation (Signed)
 Occupational Therapy Evaluation Patient Details Name: Haley Trevino MRN: [id number] DOB: [birth year] Today's Date: 11/24/2023   History of Present Illness   83 year old woman with GI bleed and generalized weakness. PMH of scleroderma, Raynaud's, hypertension, hypothyroidism, hyperlipidemia     Clinical Impressions PTA pt lives with her husband who provides S as needed for ADL and mobility. Pt reports increase in recent falls and overall generalized weakness. Recommend DC home with direct S with mobility and ADL tasks @ RW level with HHOT services. Educated pt/family on strategies to reduce risk of falls.VSS during session. All further OT to be addressed by HHOT.      If plan is discharge home, recommend the following:   A little help with walking and/or transfers;A lot of help with bathing/dressing/bathroom;Assistance with cooking/housework     Functional Status Assessment   Patient has had a recent decline in their functional status and demonstrates the ability to make significant improvements in function in a reasonable and predictable amount of time.     Equipment Recommendations   Other (comment) (rollator) - CM notified     Recommendations for Other Services         Precautions/Restrictions   Precautions Precautions: Fall     Mobility Bed Mobility Overal bed mobility: Modified Independent                  Transfers Overall transfer level: Needs assistance   Transfers: Sit to/from Stand Sit to Stand: Supervision                  Balance Overall balance assessment: History of Falls (multiple recent falls)                                         ADL either performed or assessed with clinical judgement   ADL Overall ADL's : At baseline                                       General ADL Comments: Overall set up/S. Did her sponge bath earlier. States she is more fatigued; Educated pt/family on  strateiges to reduce risk of falls. recommend using BSC by bed at night and have direct S with shower transfers and bathing; Educated to keep 1 RW upstairs and 1 RW downstairs and use a RW at all times. Began education on imoprtance of pacing herself. would benefit from further energy conservation strategies.     Vision Baseline Vision/History: 0 No visual deficits       Perception         Praxis         Pertinent Vitals/Pain Pain Assessment Pain Assessment: Faces Faces Pain Scale: Hurts little more Pain Location: L shoulder with movement Pain Descriptors / Indicators: Discomfort Pain Intervention(s): Limited activity within patient's tolerance     Extremity/Trunk Assessment Upper Extremity Assessment Upper Extremity Assessment: Generalized weakness;LUE deficits/detail LUE Deficits / Details: L shoulder dysfunction at baseline; compensates LUE Coordination: decreased gross motor   Lower Extremity Assessment Lower Extremity Assessment: Defer to PT evaluation       Communication Communication Communication: No apparent difficulties   Cognition Arousal: Alert Behavior During Therapy: WFL for tasks assessed/performed Cognition: No apparent impairments  Following commands: Intact       Cueing  General Comments          Exercises Exercises: Other exercises Other Exercises Other Exercises: counter pressure exercises prior to standing   Shoulder Instructions      Home Living Family/patient expects to be discharged to:: Private residence Living Arrangements: Spouse/significant other Available Help at Discharge: Family;Available 24 hours/day Type of Home: House Home Access: Stairs to enter [employer] of Steps: 1   Home Layout: Multi-level (split level) Alternate Level Stairs-Number of Steps: 6 up/down; landing   Bathroom Shower/Tub: Tub/shower unit;Curtain   Bathroom Toilet: Standard Bathroom  Accessibility: Yes How Accessible: Accessible via walker Home Equipment: Rollator (4 wheels);Rolling Walker (2 wheels);BSC/3in1;Shower seat;Grab bars - tub/shower          Prior Functioning/Environment Prior Level of Function : Independent/Modified Independent;Needs assist       Physical Assist : Mobility (physical) (husband is close by when walking/going up/down stairs; uses rollator when shopping)            OT Problem List: Decreased strength;Impaired balance (sitting and/or standing);Decreased activity tolerance;Decreased knowledge of use of DME or AE;Decreased safety awareness   OT Treatment/Interventions:        OT Goals(Current goals can be found in the care plan section)   Acute Rehab OT Goals Patient Stated Goal: no more falls and "feel better" OT Goal Formulation: With patient/family   OT Frequency:       Co-evaluation              AM-PAC OT "6 Clicks" Daily Activity     Outcome Measure Help from another person eating meals?: None Help from another person taking care of personal grooming?: A Little Help from another person toileting, which includes using toliet, bedpan, or urinal?: A Little Help from another person bathing (including washing, rinsing, drying)?: A Little Help from another person to put on and taking off regular upper body clothing?: A Little Help from another person to put on and taking off regular lower body clothing?: A Little 6 Click Score: 19   End of Session Equipment Utilized During Treatment: Gait belt;Rollator (4 wheels) Nurse Communication: Mobility status  Activity Tolerance: Patient tolerated treatment well Patient left: in bed;with call bell/phone within reach;with family/visitor present  OT Visit Diagnosis: Unsteadiness on feet (R26.81);History of falling (Z91.81)                Time: 9629-5284 OT Time Calculation (min): 22 min Charges:  OT General Charges $OT Visit: 1 Visit OT Evaluation $OT Eval Low Complexity: 1  Low  Zilpha Mcandrew, OT/L   Acute OT Clinical Specialist Acute Rehabilitation Services Pager [phone] Office [phone]   [facility] 11/24/2023, 4:10 PM

## 2023-11-24 NOTE — Progress Notes (Addendum)
 Progress Note   Patient: Haley Trevino:[id number] DOB: 03/18/1941 DOA: 11/22/2023     1 DOS: the patient was seen and examined on 11/24/2023   Brief hospital course: 83 year old woman with PMH of scleroderma, Raynaud's, hypertension, hypothyroidism, hyperlipidemia who presented with BRBPR.   Assessment and Plan: * GI bleed likely due to AVMs. Acute blood loss anemia. Patient presented with BRBPR.  Presented with hemoglobin of 10.8 from baseline of 12. Gastroenterology consulted.  Input appreciated. Capsule endoscopy done in 11/29/2020 revealed proximal and mid bowel AVMs. CTA done on admission was negative for active GI bleeding. - Holding home aspirin . - Gastroenterology to determine whether patient will need a colonoscopy. -Continue full liquid diet for now. - Daily hemoglobin monitoring as hemoglobin appears to have stabilized.   Lower abdominal pain Mild. X 7 days.  Abdominal exam is benign without associated fever leukocytosis vomiting or diarrhea.  CT abd unremarkable with no evidence of acute localizing process. - As needed Tylenol .   Scleroderma with Raynaud's Patient takes sildenafil  and nifedipine  at home. -Continue home medications. -Continue PPI. - Continue eyedrops - Caution for orthostasis.  Anxiety -Continue with fluoxetine  and clonazepam .  Patient uses Topamax  for as needed migraine. PDMP reviewed  Hyperlipidemia with target LDL less than 100 -Continue with zetia   Hypertension -C/w nifedpine XL  Hypothyroidism - C/w synthroid       Subjective: Patient reports she had a small bloody bowel movement last night.  Hemoglobin has been stable.  She says she is tired of needlesticks.  Patient had a headache overnight and received a dose of Percocet.  She feels that she reacted to this medication (history of similar reaction to codeine in the past) and developed chest pain.  She was treated with Benadryl  and her symptoms resolved.  Physical  Exam: Vitals:   11/23/23 2336 11/24/23 0403 11/24/23 0700 11/24/23 1216  BP: (!) 129/55 (!) 163/82  (!) 109/54  Pulse: (!) 104   83  Resp: 18  14 15   Temp: 98.5 F (36.9 C) 98.4 F (36.9 C) 98 F (36.7 C) 97.8 F (36.6 C)  TempSrc: Oral Oral Oral Oral  SpO2:      Weight:      Height:        Physical Exam   General: Alert, oriented X3  Eyes: Pupils equal, reactive  Oral cavity: moist mucous membranes  Head: Atraumatic, normocephalic  Neck: supple  Chest: clear to auscultation. No crackles, no wheezes  CVS: S1,S2 RRR. No murmurs  Abd: No distention, soft, non-tender. No masses palpable  Extr: No edema   MSK: No joint deformities or swelling  Neurological: Grossly intact.    Data Reviewed:     Latest Ref Rng & Units 11/24/2023    6:41 AM 11/23/2023    9:18 PM 11/23/2023    3:50 PM  CBC  WBC 4.0 - 10.5 K/uL 8.7  6.8  5.6   Hemoglobin 12.0 - 15.0 g/dL 9.8  9.8  9.6   Hematocrit 36.0 - 46.0 % 29.9  30.0  29.7   Platelets 150 - 400 K/uL 206  205  207       Latest Ref Rng & Units 11/24/2023    6:41 AM 11/22/2023    9:50 PM 08/10/2023    2:32 PM  BMP  Glucose 70 - 99 mg/dL 253  664  98   BUN 8 - 23 mg/dL 12  21  11    Creatinine 0.44 - 1.00 mg/dL 4.03  4.74  2.59  BUN/Creat Ratio 12 - 28   13   Sodium 135 - 145 mmol/L 136  140  140   Potassium 3.5 - 5.1 mmol/L 3.8  4.6  3.7   Chloride 98 - 111 mmol/L 106  105  104   CO2 22 - 32 mmol/L 20  21  21    Calcium  8.9 - 10.3 mg/dL 8.5  9.3  8.7      Family Communication: Spoke with husband and daughter at bedside  Disposition: Status is: Inpatient Remains inpatient appropriate because: GI bleed  Planned Discharge Destination: Home    Time spent: 50 minutes  Author: MDALA-GAUSI, Claudell Rhody AGATHA, MD 11/24/2023 12:56 PM  For on call review [email or web address].

## 2023-11-24 NOTE — Evaluation (Signed)
 Physical Therapy Evaluation Patient Details Name: Haley Trevino MRN: [id number] DOB: [birth year] Today's Date: 11/24/2023  History of Present Illness  83 year old woman with GI bleed and generalized weakness. PMH of scleroderma, Raynaud's, hypertension, hypothyroidism, hyperlipidemia  Clinical Impression  PTA,pt lives with her spouse who provides supervision as needed for ADL's and mobility. Pt reports increase in recent falls and generalized weakness. Pt also reports "chronic vertigo," however, could not reproduce on exam and no dizziness with VOR x 1 or 2 or nystagmus noted. Pt ambulating room distances with no assistive device at a CGA level; demonstrates intermittent scissoring gait and utilizes a narrow BOS. Pt would benefit from external support of an assistive device and HHPT to address deficits. Provided with HEP for general strengthening and standing balance.       If plan is discharge home, recommend the following: A little help with walking and/or transfers;A little help with bathing/dressing/bathroom;Assistance with cooking/housework;Assist for transportation;Help with stairs or ramp for entrance   Can travel by private vehicle        Equipment Recommendations Rollator (4 wheels)  Recommendations for Other Services       Functional Status Assessment Patient has had a recent decline in their functional status and demonstrates the ability to make significant improvements in function in a reasonable and predictable amount of time.     Precautions / Restrictions Precautions Precautions: Fall Restrictions Weight Bearing Restrictions Per Provider Order: No      Mobility  Bed Mobility Overal bed mobility: Modified Independent                  Transfers Overall transfer level: Needs assistance Equipment used: None Transfers: Sit to/from Stand Sit to Stand: Supervision                Ambulation/Gait Ambulation/Gait assistance: Contact guard  assist Gait Distance (Feet): 20 Feet Assistive device: None Gait Pattern/deviations: Step-through pattern, Decreased stride length, Scissoring, Narrow base of support Gait velocity: decreased     General Gait Details: CGA for safety, intermittent scissoring gait pattern  Stairs            Wheelchair Mobility     Tilt Bed    Modified Rankin (Stroke Patients Only)       Balance Overall balance assessment: History of Falls (multiple recent falls)                                           Pertinent Vitals/Pain Pain Assessment Pain Assessment: Faces Faces Pain Scale: Hurts little more Pain Location: L shoulder with movement Pain Descriptors / Indicators: Discomfort Pain Intervention(s): Limited activity within patient's tolerance, Monitored during session    Home Living Family/patient expects to be discharged to:: Private residence Living Arrangements: Spouse/significant other Available Help at Discharge: Family;Available 24 hours/day Type of Home: House Home Access: Stairs to enter   Entrance Stairs-Number of Steps: 1 Alternate Level Stairs-Number of Steps: 6 up/down; landing Home Layout: Multi-level (split level) Home Equipment: Rollator (4 wheels);Rolling Walker (2 wheels);BSC/3in1;Shower seat;Grab bars - tub/shower      Prior Function Prior Level of Function : Independent/Modified Independent;Needs assist       Physical Assist : Mobility (physical) (husband is close by when walking/going up/down stairs; uses rollator when shopping)             Extremity/Trunk Assessment   Upper Extremity Assessment Upper Extremity Assessment: Defer  to OT evaluation LUE Deficits / Details: L shoulder dysfunction at baseline; compensates LUE Coordination: decreased gross motor    Lower Extremity Assessment Lower Extremity Assessment: RLE deficits/detail;LLE deficits/detail RLE Deficits / Details: Strength 5/5 RLE Coordination: decreased gross  motor LLE Deficits / Details: Strength 5/5 LLE Coordination: decreased gross motor    Cervical / Trunk Assessment Cervical / Trunk Assessment: Kyphotic  Communication   Communication Communication: No apparent difficulties    Cognition Arousal: Alert Behavior During Therapy: WFL for tasks assessed/performed   PT - Cognitive impairments: No apparent impairments                         Following commands: Intact       Cueing       General Comments      Exercises     Assessment/Plan    PT Assessment Patient needs continued PT services  PT Problem List Decreased strength;Decreased activity tolerance;Decreased balance;Decreased mobility;Decreased coordination       PT Treatment Interventions DME instruction;Stair training;Gait training;Functional mobility training;Therapeutic activities;Therapeutic exercise;Balance training;Patient/family education    PT Goals (Current goals can be found in the Care Plan section)  Acute Rehab PT Goals Patient Stated Goal: to get stronger and more balanced PT Goal Formulation: With patient/family Time For Goal Achievement: 12/08/23 Potential to Achieve Goals: Good    Frequency Min 2X/week     Co-evaluation               AM-PAC PT "6 Clicks" Mobility  Outcome Measure Help needed turning from your back to your side while in a flat bed without using bedrails?: None Help needed moving from lying on your back to sitting on the side of a flat bed without using bedrails?: None Help needed moving to and from a bed to a chair (including a wheelchair)?: A Little Help needed standing up from a chair using your arms (e.g., wheelchair or bedside chair)?: A Little Help needed to walk in hospital room?: A Little Help needed climbing 3-5 steps with a railing? : A Little 6 Click Score: 20    End of Session Equipment Utilized During Treatment: Gait belt Activity Tolerance: Patient tolerated treatment well Patient left: in  bed;with call bell/phone within reach;with bed alarm set;with family/visitor present Nurse Communication: Mobility status PT Visit Diagnosis: Unsteadiness on feet (R26.81);History of falling (Z91.81)    Time: 1610-9604 PT Time Calculation (min) (ACUTE ONLY): 26 min   Charges:   PT Evaluation $PT Eval Low Complexity: 1 Low PT Treatments $Therapeutic Activity: 8-22 mins PT General Charges $$ ACUTE PT VISIT: 1 Visit         Verdia Glad, PT, DPT Acute Rehabilitation Services Office [phone]   Claria Crofts 11/24/2023, 4:38 PM

## 2023-11-24 NOTE — TOC Initial Note (Addendum)
 Transition of Care [facility]) - Initial/Assessment Note    Patient Details  Name: Haley Trevino MRN: [id number] Date of Birth: [birth year]  [facility]) CM/SW Contact:    Cosimo Diones, RN Phone Number: 11/24/2023, 3:02 PM  Clinical Narrative: Patient presented for GI bleed and general weakness. PTA patient states she was from home with spouse and she feels like she can't do things as she use to. Spouse prepares meals and cleans the home. Spouse takes the patient to appointments. Patient lives in a split level home and has to have spouses assistance up the stairs to the bedroom. Case Manager discussed the benefit of home health services- PT/OT has been ordered. Patient has been to outpatient PT; however, she states she had a co pay. Patient feels that she will benefit from [facility] services due to over exertion. Patient has DME: rollator, rolling walker, bedside commode, shower chair, and cane. Case Manager discussed personal care services and cost- vs checking with DSS to see if the patient is eligible for Medicaid. Spouse is a Cytogeneticist and information provided for him to contact the [facility] [employer] to see if spouse can be added to his benefits for the [facility]. Daughter is in the room during the visit and is willing to assist with the above information. Daughter states she checks in often; however she lives in [place] and is unable to visit daily. Case Manager awaiting PT/T recommendations.       1541 11-24-23 Case Manager received recommendations for [facility] PT/OT-OT to assist with bath. Medicare.gov list provided to the patient and daughter. Patient chose [facility] and referral submitted to the office. Start of care to begin within 24-48 hours post transition home. No further needs identified.   1636 11-24-23 DME rolling walker with seat ordered via [employer]. Will deliver to the room.    Expected Discharge Plan: Home w Home Health Services Barriers to Discharge: Continued  Medical Work up   Patient Goals and CMS Choice Patient states their goals for this hospitalization and ongoing recovery are:: patient wants to return home once stable.          Expected Discharge Plan and Services In-house Referral: NA Discharge Planning Services: CM Consult Post Acute Care Choice: Home Health Living arrangements for the past 2 months: Single Family Home                                      Prior Living Arrangements/Services Living arrangements for the past 2 months: Single Family Home Lives with:: Spouse Patient language and need for interpreter reviewed:: Yes Do you feel safe going back to the place where you live?: Yes      Need for Family Participation in Patient Care: Yes (Comment) Care giver support system in place?: Yes (comment) Current home services: DME (cane, shower chair, bedside commode, rolling walker and rollator.) Criminal Activity/Legal Involvement Pertinent to Current Situation/Hospitalization: No - Comment as needed  Activities of Daily Living   ADL Screening (condition at time of admission) Independently performs ADLs?: Yes (appropriate for developmental age) Is the patient deaf or have difficulty hearing?: No Does the patient have difficulty seeing, even when wearing glasses/contacts?: No Does the patient have difficulty concentrating, remembering, or making decisions?: No  Permission Sought/Granted Permission sought to share information with : Family Supports, Case Manager  Emotional Assessment Appearance:: Appears stated age Attitude/Demeanor/Rapport: Engaged Affect (typically observed): Appropriate Orientation: : Oriented to Self, Oriented to Place, Oriented to Situation, Oriented to  Time Alcohol / Substance Use: Not Applicable Psych Involvement: No (comment)  Admission diagnosis:  GI bleed [K92.2] Gastrointestinal hemorrhage, unspecified gastrointestinal hemorrhage type [K92.2] Patient Active  Problem List   Diagnosis Date Noted   GI bleed 11/23/2023   Lower abdominal pain 11/23/2023   Dizziness 09/22/2023   Sensorineural hearing loss, bilateral 09/22/2023   Impacted cerumen of both ears 09/22/2023   Syncope 05/19/2023   Pulmonary HTN (HCC) 12/18/2022   Heart block AV complete (HCC) 05/27/2022   S/P TAVR (transcatheter aortic valve replacement) 05/20/2022   Anxiety 05/14/2022   Hyperkalemia 05/14/2022   Chronic diastolic CHF (congestive heart failure) (HCC) 05/13/2022   Acute respiratory distress 05/13/2022   Nonrheumatic aortic valve stenosis 04/28/2022   Precordial chest pain 03/13/2022   History of adenomatous polyp of colon 09/21/2020   Weight loss 09/21/2020   Irritable bowel syndrome 09/21/2020   Breast implant rupture 09/04/2020   Iron  deficiency anemia due to chronic blood loss 08/28/2020   DOE (dyspnea on exertion) 11/18/2017   Murmur 11/18/2017   Impingement syndrome of left shoulder region 12/08/2014   Osteoporosis 09/20/2014   Hip fracture requiring operative repair (HCC) 02/22/2014   Hypothyroidism 11/29/2013   Depression 11/29/2013   GERD (gastroesophageal reflux disease) 11/29/2013   Hypertension 11/29/2013   Hyperlipidemia with target LDL less than 100 11/29/2013   Thyroid  cyst 04/26/2013   Melanoma of skin (HCC) 08/07/2010   [name] 08/06/2010   SCLERODERMA 08/06/2010   Osteoarthritis 08/06/2010   PCP:  Delfina Feller, FNP Pharmacy:   [facility] [place], [place] - [address] [place] [address] Phone: [phone] Fax: [phone]  [facility] [address] [place] [address] Phone: [phone] Fax: [phone]     Social Drivers of Health (SDOH) Social History: SDOH Screenings   Food Insecurity: No Food Insecurity (06/08/2023)  Housing: Patient Declined (06/08/2023)  Transportation Needs: No Transportation Needs (06/08/2023)   Utilities: Not At Risk (06/08/2023)  Alcohol Screen: Low Risk  (06/08/2023)  Depression (PHQ2-9): Low Risk  (07/20/2023)  Financial Resource Strain: Low Risk  (06/08/2023)  Physical Activity: Inactive (06/08/2023)  Social Connections: Moderately Integrated (06/08/2023)  Stress: No Stress Concern Present (06/08/2023)  Tobacco Use: Low Risk  (11/22/2023)  Health Literacy: Adequate Health Literacy (06/08/2023)   SDOH Interventions:     Readmission Risk Interventions     No data to display

## 2023-11-24 NOTE — Plan of Care (Signed)
  Problem: Clinical Measurements: Goal: Respiratory complications will improve Outcome: Progressing Goal: Cardiovascular complication will be avoided Outcome: Progressing   Problem: Activity: Goal: Risk for activity intolerance will decrease Outcome: Progressing   Problem: Safety: Goal: Ability to remain free from injury will improve Outcome: Progressing   

## 2023-11-25 ENCOUNTER — Ambulatory Visit: Payer: Medicare Other | Admitting: Cardiology

## 2023-11-25 ENCOUNTER — Inpatient Hospital Stay

## 2023-11-25 DIAGNOSIS — K922 Gastrointestinal hemorrhage, unspecified: Secondary | ICD-10-CM | POA: Diagnosis not present

## 2023-11-25 LAB — CBC
HCT: 27.2 % — ABNORMAL LOW (ref 36.0–46.0)
Hemoglobin: 9 g/dL — ABNORMAL LOW (ref 12.0–15.0)
MCH: 30 pg (ref 26.0–34.0)
MCHC: 33.1 g/dL (ref 30.0–36.0)
MCV: 90.7 fL (ref 80.0–100.0)
Platelets: 200 10*3/uL (ref 150–400)
RBC: 3 MIL/uL — ABNORMAL LOW (ref 3.87–5.11)
RDW: 15.1 % (ref 11.5–15.5)
WBC: 5.4 10*3/uL (ref 4.0–10.5)
nRBC: 0.4 % — ABNORMAL HIGH (ref 0.0–0.2)

## 2023-11-25 LAB — COMPREHENSIVE METABOLIC PANEL WITH GFR
ALT: 14 U/L (ref 0–44)
AST: 19 U/L (ref 15–41)
Albumin: 3.1 g/dL — ABNORMAL LOW (ref 3.5–5.0)
Alkaline Phosphatase: 60 U/L (ref 38–126)
Anion gap: 6 (ref 5–15)
BUN: 10 mg/dL (ref 8–23)
CO2: 21 mmol/L — ABNORMAL LOW (ref 22–32)
Calcium: 8.4 mg/dL — ABNORMAL LOW (ref 8.9–10.3)
Chloride: 109 mmol/L (ref 98–111)
Creatinine, Ser: 0.91 mg/dL (ref 0.44–1.00)
GFR, Estimated: >60 mL/min (ref >60)
Glucose, Bld: 89 mg/dL (ref 70–99)
Potassium: 3.6 mmol/L (ref 3.5–5.1)
Sodium: 136 mmol/L (ref 135–145)
Total Bilirubin: 0.3 mg/dL (ref 0.0–1.2)
Total Protein: 5.9 g/dL — ABNORMAL LOW (ref 6.5–8.1)

## 2023-11-25 NOTE — Plan of Care (Signed)
  Problem: Health Behavior/Discharge Planning: Goal: Ability to manage health-related needs will improve Outcome: Progressing   Problem: Clinical Measurements: Goal: Respiratory complications will improve Outcome: Progressing Goal: Cardiovascular complication will be avoided Outcome: Progressing   Problem: Activity: Goal: Risk for activity intolerance will decrease Outcome: Progressing   Problem: Safety: Goal: Ability to remain free from injury will improve Outcome: Progressing

## 2023-11-25 NOTE — Hospital Course (Signed)
 83 year old woman with PMH of scleroderma, Raynaud's, hypertension, hypothyroidism, hyperlipidemia who presented with BRBPR.    Assessment and Plan: GI bleed likely due to AVMs Acute blood loss anemia Patient presented with BRBPR Presented with hemoglobin of 10.8 from baseline of 12 Gastroenterology consulted.  Input appreciated Capsule endoscopy done in 11/29/2020 revealed proximal and mid bowel AVMs CTA done on admission was negative for active GI bleeding -Aspirin  held during hospitalization -No further inpatient intervention per GI -Hemoglobin relatively stable at discharge, 9 g/dL.  Patient only had very minimal speck of blood prior to discharge -Will need CBC at follow-up   Lower abdominal pain Mild. X 7 days.  Abdominal exam is benign without associated fever leukocytosis vomiting or diarrhea.  CT abd unremarkable with no evidence of acute localizing process. - As needed Tylenol .    Scleroderma with Raynaud's Patient takes sildenafil  and nifedipine  at home. -Continue home medications. -Continue PPI. - Continue eyedrops - Caution for orthostasis.   Anxiety -Continue with fluoxetine  and clonazepam .  Patient uses Topamax  for as needed migraine. PDMP reviewed   Hyperlipidemia with target LDL less than 100 -Continue with zetia    Hypertension -C/w nifedpine XL   Hypothyroidism - C/w synthroid 

## 2023-11-25 NOTE — Progress Notes (Signed)
 Discharge instructions reviewed with pt and her husband.  Copy of instructions given to pt. No changes in medications, no new scripts. Pt may resume her aspirin  EC 81mg  starting tomorrow 5/22 per MD.  Pt will be d/c'd via wheelchair with belongings and will be escorted by hospital volunteer.   Maia Handa,RN SWOT

## 2023-11-25 NOTE — Discharge Summary (Signed)
 Physician Discharge Summary   Haley Trevino ZOX:[id number] DOB: May 08, 1941 DOA: 11/22/2023  PCP: Delfina Feller, FNP  Admit date: 11/22/2023 Discharge date: 11/25/2023  Admitted From: Home Disposition:  Home Discharging physician: Faith Homes, MD Barriers to discharge: none  Recommendations at discharge: Repeat CBC at follow up  Discharge Condition: stable CODE STATUS: Full  Diet recommendation:  Diet Orders (From admission, onward)     Start     Ordered   11/25/23 0000  Diet - low sodium heart healthy        11/25/23 0956   11/24/23 1619  DIET SOFT Room service appropriate? Yes; Fluid consistency: Thin  Diet effective now       Question Answer Comment  Room service appropriate? Yes   Fluid consistency: Thin      11/24/23 1618            Hospital Course: 83 year old woman with PMH of scleroderma, Raynaud's, hypertension, hypothyroidism, hyperlipidemia who presented with BRBPR.    Assessment and Plan: GI bleed likely due to AVMs Acute blood loss anemia Patient presented with BRBPR Presented with hemoglobin of 10.8 from baseline of 12 Gastroenterology consulted.  Input appreciated Capsule endoscopy done in 11/29/2020 revealed proximal and mid bowel AVMs CTA done on admission was negative for active GI bleeding -Aspirin  held during hospitalization -No further inpatient intervention per GI -Hemoglobin relatively stable at discharge, 9 g/dL.  Patient only had very minimal speck of blood prior to discharge -Will need CBC at follow-up   Lower abdominal pain Mild. X 7 days.  Abdominal exam is benign without associated fever leukocytosis vomiting or diarrhea.  CT abd unremarkable with no evidence of acute localizing process. - As needed Tylenol .    Scleroderma with Raynaud's Patient takes sildenafil  and nifedipine  at home. -Continue home medications. -Continue PPI. - Continue eyedrops - Caution for orthostasis.   Anxiety -Continue with fluoxetine   and clonazepam .  Patient uses Topamax  for as needed migraine. PDMP reviewed   Hyperlipidemia with target LDL less than 100 -Continue with zetia    Hypertension -C/w nifedpine XL   Hypothyroidism - C/w synthroid   Assessment and Plan: * GI bleed BRBPR abotu 5 PM yesterday. Sincgle episdoes. No recurnet. VSS. Check inr ptt . CBC shows about 1gm hgb loss not at transfusion threhold. Hold aspirin . CT CT+ not actionable. Clears PO. Monitorin. Apprecitea GI input in chart.  Lower abdominal pain Mild. X 7 days.  Abdominal exam is benign without associated fever leukocytosis vomiting or diarrhea. CT abd not actionable. acetaminophen   Pulmonary HTN (HCC) C/w revatio  and nifedipine  XL. Patinet uses lasix  PRN. However, given gentle ongoing hydration will order standing. Curently euvolemic.  Anxiety Continue with fluoxetine  and clonazepam .  Patient uses Topamax  for as needed migraine. PDMP reviewed  Hyperlipidemia with target LDL less than 100 Continue with zetia   Hypertension C/w nifedpine XL  Hypothyroidism C/w synthroid   SCLERODERMA C/w restasis  eye drop. Patiet has GERD/heart burn chronic. Changed omeprazole  to iv ppi    The patient's acute and chronic medical conditions were treated accordingly. On day of discharge, patient was felt deemed stable for discharge. Patient/family member advised to call PCP or come back to ER if needed.   Principal Diagnosis: GI bleed  Discharge Diagnoses: Active Hospital Problems   Diagnosis Date Noted   GI bleed 11/23/2023   Lower abdominal pain 11/23/2023    Resolved Hospital Problems  No resolved problems to display.     Discharge Instructions     Diet - low sodium heart healthy  Complete by: As directed    Increase activity slowly   Complete by: As directed       Allergies as of 11/25/2023       Reactions   Bactrim  [sulfamethoxazole -trimethoprim ] Nausea And Vomiting   Dilaudid [hydromorphone Hcl] Anaphylaxis   Eliquis   [apixaban ] Other (See Comments)   Caused bleed   Acyclovir And Related Nausea And Vomiting   Crestor  [rosuvastatin  Calcium ] Other (See Comments)   Muscle pain   Gabapentin Nausea And Vomiting, Swelling   Swelling in feet and in legs   Aleve [naproxen Sodium] Rash   Lyrica [pregabalin] Other (See Comments)   Swelling in feet, and in legs        Medication List     TAKE these medications    acetaminophen  500 MG tablet Commonly known as: TYLENOL  Take 1,000 mg by mouth at bedtime.   aspirin  EC 81 MG tablet Take 81 mg by mouth daily. Swallow whole.   clonazePAM  0.5 MG tablet Commonly known as: KLONOPIN  Take 1 tablet (0.5 mg total) by mouth 2 (two) times daily as needed for anxiety.   ezetimibe  10 MG tablet Commonly known as: Zetia  Take 1 tablet (10 mg total) by mouth daily.   FLUoxetine  40 MG capsule Commonly known as: PROZAC  Take 1 capsule (40 mg total) by mouth daily.   fluticasone  50 MCG/ACT nasal spray Commonly known as: FLONASE  PLACE 2 SPRAYS INTO BOTH NOSTRILS DAILY What changed:  when to take this reasons to take this   furosemide  20 MG tablet Commonly known as: LASIX  Take 1 tablet (20 mg total) by mouth daily as needed. What changed: reasons to take this   NIFEdipine  60 MG 24 hr tablet Commonly known as: PROCARDIA  XL/NIFEDICAL XL Take 1 tablet (60 mg total) by mouth daily.   omeprazole  20 MG capsule Commonly known as: PRILOSEC TAKE 1 TABLET DAILY What changed:  how much to take how to take this when to take this   Restasis  0.05 % ophthalmic emulsion Generic drug: cycloSPORINE  Place 1 drop into both eyes 2 (two) times daily.   sildenafil  20 MG tablet Commonly known as: REVATIO  Take 1 tablet (20 mg total) by mouth 2 (two) times daily.   Synthroid  75 MCG tablet Generic drug: levothyroxine  TAKE (1) TABLET DAILY BE- FORE BREAKFAST. What changed:  how much to take how to take this when to take this   topiramate  25 MG tablet Commonly known  as: Topamax  Take 1 tablet (25 mg total) by mouth at bedtime as needed.               Durable Medical Equipment  (From admission, onward)           Start     Ordered   11/24/23 1636  For home use only DME 4 wheeled rolling walker with seat  Once       Question:  Patient needs a walker to treat with the following condition  Answer:  General weakness   11/24/23 1635            Follow-up Information     Care, [facility] Follow up.   Why: Physical Therapy and Occupational [employer]-for bath assistance. Contact information: [address] [place] [address] [phone]         [facility] Follow up.   Why: Rolling walker with seat. Contact information: Address: [address], [place], [place] [address] Phone: [phone]  Allergies  Allergen Reactions   Bactrim  [Sulfamethoxazole -Trimethoprim ] Nausea And Vomiting   Dilaudid [Hydromorphone Hcl] Anaphylaxis   Eliquis  [Apixaban ] Other (See Comments)    Caused bleed   Acyclovir And Related Nausea And Vomiting   Crestor  [Rosuvastatin  Calcium ] Other (See Comments)    Muscle pain   Gabapentin Nausea And Vomiting and Swelling    Swelling in feet and in legs   Aleve [Naproxen Sodium] Rash   Lyrica [Pregabalin] Other (See Comments)    Swelling in feet, and in legs    Consultations: GI  Procedures:   Discharge Exam: BP (!) 137/53 (BP Location: Right Arm)   Pulse 64   Temp 98.6 F (37 C) (Oral)   Resp 18   Ht 5\' 2"  (1.575 m)   Wt 45.1 kg   SpO2 95%   BMI 18.18 kg/m  Physical Exam Constitutional:      Appearance: Normal appearance.  HENT:     Head: Normocephalic and atraumatic.     Mouth/Throat:     Mouth: Mucous membranes are moist.  Eyes:     Extraocular Movements: Extraocular movements intact.  Cardiovascular:     Rate and Rhythm: Normal rate and regular rhythm.  Pulmonary:     Effort: Pulmonary effort is normal. No respiratory distress.      Breath sounds: Normal breath sounds. No wheezing.  Abdominal:     General: Bowel sounds are normal. There is no distension.     Palpations: Abdomen is soft.     Tenderness: There is no abdominal tenderness.  Musculoskeletal:        General: Normal range of motion.     Cervical back: Normal range of motion and neck supple.  Skin:    General: Skin is warm and dry.  Neurological:     General: No focal deficit present.     Mental Status: She is alert.  Psychiatric:        Mood and Affect: Mood normal.      The results of significant diagnostics from this hospitalization (including imaging, microbiology, ancillary and laboratory) are listed below for reference.   Microbiology: No results found for this or any previous visit (from the past 240 hours).   Labs: BNP (last 3 results) No results for input(s): "BNP" in the last 8760 hours. Basic Metabolic Panel: Recent Labs  Lab 11/22/23 2150 11/24/23 0641 11/25/23 0446  NA 140 136 136  K 4.6 3.8 3.6  CL 105 106 109  CO2 21* 20* 21*  GLUCOSE 112* 103* 89  BUN 21 12 10   CREATININE 0.96 0.88 0.91  CALCIUM  9.3 8.5* 8.4*  MG  --  1.8  --    Liver Function Tests: Recent Labs  Lab 11/22/23 2150 11/24/23 0641 11/25/23 0446  AST 13* 62* 19  ALT 5 20 14   ALKPHOS 75 69 60  BILITOT <0.2 0.4 0.3  PROT 7.1 6.6 5.9*  ALBUMIN 4.1 3.5 3.1*   Recent Labs  Lab 11/22/23 2150 11/24/23 0641  LIPASE 34 27   No results for input(s): "AMMONIA" in the last 168 hours. CBC: Recent Labs  Lab 11/22/23 2150 11/23/23 0419 11/23/23 1356 11/23/23 1550 11/23/23 2118 11/24/23 0641 11/25/23 0446  WBC 8.4  --   --  5.6 6.8 8.7 5.4  HGB 10.8*   < > 8.9* 9.6* 9.8* 9.8* 9.0*  HCT 32.3*   < > 27.4* 29.7* 30.0* 29.9* 27.2*  MCV 92.6  --   --  90.5 89.8 89.8 90.7  PLT 255  --   --  207 205 206 200   < > = values in this interval not displayed.   Cardiac Enzymes: No results for input(s): "CKTOTAL", "CKMB", "CKMBINDEX", "TROPONINI" in the  last 168 hours. BNP: Invalid input(s): "POCBNP" CBG: No results for input(s): "GLUCAP" in the last 168 hours. D-Dimer No results for input(s): "DDIMER" in the last 72 hours. Hgb A1c No results for input(s): "HGBA1C" in the last 72 hours. Lipid Profile No results for input(s): "CHOL", "HDL", "LDLCALC", "TRIG", "CHOLHDL", "LDLDIRECT" in the last 72 hours. Thyroid  function studies No results for input(s): "TSH", "T4TOTAL", "T3FREE", "THYROIDAB" in the last 72 hours.  Invalid input(s): "FREET3" Anemia work up No results for input(s): "VITAMINB12", "FOLATE", "FERRITIN", "TIBC", "IRON ", "RETICCTPCT" in the last 72 hours. Urinalysis    Component Value Date/Time   COLORURINE COLORLESS (A) 11/22/2023 2150   APPEARANCEUR CLEAR 11/22/2023 2150   APPEARANCEUR Clear 07/20/2023 1100   LABSPEC 1.039 (H) 11/22/2023 2150   PHURINE 6.0 11/22/2023 2150   GLUCOSEU NEGATIVE 11/22/2023 2150   HGBUR TRACE (A) 11/22/2023 2150   BILIRUBINUR NEGATIVE 11/22/2023 2150   BILIRUBINUR Negative 07/20/2023 1100   KETONESUR NEGATIVE 11/22/2023 2150   PROTEINUR NEGATIVE 11/22/2023 2150   UROBILINOGEN negative 09/04/2014 1420   UROBILINOGEN 0.2 02/21/2014 2330   NITRITE NEGATIVE 11/22/2023 2150   LEUKOCYTESUR NEGATIVE 11/22/2023 2150   Sepsis Labs Recent Labs  Lab 11/23/23 1550 11/23/23 2118 11/24/23 0641 11/25/23 0446  WBC 5.6 6.8 8.7 5.4   Microbiology No results found for this or any previous visit (from the past 240 hours).  Procedures/Studies: DG Chest Port 1 View Result Date: 11/24/2023 CLINICAL DATA:  Chest tightness. EXAM: PORTABLE CHEST 1 VIEW COMPARISON:  Chest radiograph dated 05/19/2023. FINDINGS: No focal consolidation, pleural effusion, or pneumothorax. A 1 cm nodular density over the right lung base, likely nipple shadow. The cardiac silhouette is within normal limits. Left pectoral pacemaker device and aortic valve repair. No acute osseous pathology. IMPRESSION: No active disease.  Electronically Signed   By: Angus Bark M.D.   On: 11/24/2023 09:54   CT ANGIO GI BLEED Result Date: 11/22/2023 CLINICAL DATA:  Lower GI bleed EXAM: CTA ABDOMEN AND PELVIS WITHOUT AND WITH CONTRAST TECHNIQUE: Multidetector CT imaging of the abdomen and pelvis was performed using the standard protocol during bolus administration of intravenous contrast. Multiplanar reconstructed images and MIPs were obtained and reviewed to evaluate the vascular anatomy. RADIATION DOSE REDUCTION: This exam was performed according to the departmental dose-optimization program which includes automated exposure control, adjustment of the mA and/or kV according to patient size and/or use of iterative reconstruction technique. CONTRAST:  80mL OMNIPAQUE  IOHEXOL  350 MG/ML SOLN COMPARISON:  CT abdomen and pelvis 04/07/2022. FINDINGS: VASCULAR Aorta: Normal caliber aorta without aneurysm, dissection, vasculitis or significant stenosis. There is calcified atherosclerotic disease throughout the aorta. Celiac: Patent without evidence of aneurysm, dissection, vasculitis or significant stenosis. SMA: Patent without evidence of aneurysm, dissection, vasculitis or significant stenosis. Renals: Both renal arteries are patent without evidence of aneurysm, dissection, vasculitis, fibromuscular dysplasia or significant stenosis. IMA: Patent without evidence of aneurysm, dissection, vasculitis or significant stenosis. Inflow: Patent without evidence of aneurysm, dissection, vasculitis or significant stenosis. Proximal Outflow: Bilateral common femoral and visualized portions of the superficial and profunda femoral arteries are patent without evidence of aneurysm, dissection, vasculitis or significant stenosis. Veins: No obvious venous abnormality within the limitations of this arterial phase study. Review of the MIP images confirms the above findings. NON-VASCULAR Lower chest: No acute abnormality. Hepatobiliary: There is mild intra and  extrahepatic biliary ductal dilatation. Patient is status post cholecystectomy. No focal liver lesions are seen. Pancreas: Unremarkable. No pancreatic ductal dilatation or surrounding inflammatory changes. Spleen: Normal in size without focal abnormality. Adrenals/Urinary Tract: Adrenal glands are unremarkable. Kidneys are normal, without renal calculi, focal lesion, or hydronephrosis. Bladder is unremarkable. Stomach/Bowel: No active gastrointestinal bleeding identified. There is a small hiatal hernia. Stomach is within normal limits. Appendix is not seen. No evidence of bowel wall thickening, distention, or inflammatory changes. Lymphatic: No enlarged lymph nodes are seen. Reproductive: Status post hysterectomy. No adnexal masses. Other: No abdominal wall hernia or abnormality. No abdominopelvic ascites. Musculoskeletal: Multilevel degenerative changes affect the spine. There sacroplasty changes bilaterally. Right-sided hip screw is present. IMPRESSION: 1. No evidence for active gastrointestinal bleeding. 2. Aortic atherosclerosis. 3. No acute localizing process in the abdomen or pelvis. 4. Small hiatal hernia. Aortic Atherosclerosis (ICD10-I70.0). Electronically Signed   By: Tyron Gallon M.D.   On: 11/22/2023 23:52     Time coordinating discharge: Over 30 minutes    Faith Homes, MD  [facility] 11/25/2023, 2:22 PM

## 2023-11-25 NOTE — Plan of Care (Signed)

## 2023-11-26 ENCOUNTER — Telehealth: Payer: Self-pay | Admitting: *Deleted

## 2023-11-26 NOTE — Transitions of Care (Post Inpatient/ED Visit) (Signed)
 11/26/2023  Name: Haley Trevino MRN: [id number] DOB: 12-22-1940  Today's TOC FU Call Status: Today's TOC FU Call Status:: Successful TOC FU Call Completed TOC FU Call Complete Date: 11/26/23 Patient's Name and Date of Birth confirmed.  Transition Care Management Follow-up Telephone Call Date of Discharge: 11/25/23 Discharge Facility: [facility]) Type of Discharge: Inpatient Admission Primary Inpatient Discharge Diagnosis:: GI hemorrhage How have you been since you were released from the hospital?:  (eating, drinking well, denies any s/s GI bleeding, ambulating with cane, spouse is supportive, helpful) Any questions or concerns?: No  Items Reviewed: Did you receive and understand the discharge instructions provided?: Yes Medications obtained,verified, and reconciled?: Yes (Medications Reviewed) Any new allergies since your discharge?: No Dietary orders reviewed?: Yes Type of Diet Ordered:: low sodium,  heart healthy Do you have support at home?: Yes People in Home [RPT]: spouse Name of Support/Comfort Primary Source: Catheline Clos Reviewed signs/ symptoms GI bleeding, reportable signs/ symptoms Patient is high risk for falls- reviewed safety precautions  Medications Reviewed Today: Medications Reviewed Today     Reviewed by Daralyn Earl, RN (Registered Nurse) on 11/26/23 at 1102  Med List Status: <None>   Medication Order Taking? Sig Documenting Provider Last Dose Status Informant  acetaminophen  (TYLENOL ) 500 MG tablet [id number] Yes Take 1,000 mg by mouth at bedtime. [provider] Taking Active Self, Pharmacy Records  aspirin  EC 81 MG tablet [id number] Yes Take 81 mg by mouth daily. Swallow whole. [provider] Taking Active Self, Pharmacy Records  clonazePAM  (KLONOPIN ) 0.5 MG tablet [id number] Yes Take 1 tablet (0.5 mg total) by mouth 2 (two) times daily as needed for anxiety. Delfina Feller, FNP Taking Active Self, Pharmacy Records   ezetimibe  (ZETIA ) 10 MG tablet [id number] Yes Take 1 tablet (10 mg total) by mouth daily. Delfina Feller, FNP Taking Active Self, Pharmacy Records  FLUoxetine  (PROZAC ) 40 MG capsule [id number] Yes Take 1 capsule (40 mg total) by mouth daily. Delfina Feller, FNP Taking Active Self, Pharmacy Records  fluticasone  (FLONASE ) 50 MCG/ACT nasal spray [id number] Yes PLACE 2 SPRAYS INTO BOTH NOSTRILS DAILY  Patient taking differently: Place 2 sprays into both nostrils as needed for allergies.   Delfina Feller, FNP Taking Active Self, Pharmacy Records  furosemide  (LASIX ) 20 MG tablet [id number] Yes Take 1 tablet (20 mg total) by mouth daily as needed.  Patient taking differently: Take 20 mg by mouth daily as needed for fluid.   Delfina Feller, FNP Taking Active Self, Pharmacy Records           Med Note Marylen Snowman Nov 23, 2023 11:53 AM) Has at home - hasn't used in a while.  NIFEdipine  (PROCARDIA  XL/NIFEDICAL XL) 60 MG 24 hr tablet [id number] Yes Take 1 tablet (60 mg total) by mouth daily. Delfina Feller, FNP Taking Active Self, Pharmacy Records  omeprazole  [facility]) 20 MG capsule [id number] Yes TAKE 1 TABLET DAILY  Patient taking differently: Take 20 mg by mouth daily. TAKE 1 TABLET DAILY   Delfina Feller, FNP Taking Active Self, Pharmacy Records  RESTASIS  0.05 % ophthalmic emulsion [id number] Yes Place 1 drop into both eyes 2 (two) times daily. [provider] Taking Active Self, Pharmacy Records  sildenafil  (REVATIO ) 20 MG tablet [id number] Yes Take 1 tablet (20 mg total) by mouth 2 (two) times daily. Delfina Feller, FNP Taking Active Self, Pharmacy Records  SYNTHROID  75 MCG tablet [id number] Yes TAKE (1) TABLET DAILY BE- FORE BREAKFAST.  Patient taking differently: Take 75 mcg  by mouth every morning. TAKE (1) TABLET DAILY BE- FORE BREAKFAST.   Delfina Feller, FNP Taking Active Self, Pharmacy Records  topiramate  (TOPAMAX ) 25 MG  tablet [id number] Yes Take 1 tablet (25 mg total) by mouth at bedtime as needed. Liam Redhead, MD Taking Active Self, Pharmacy Records            Home Care and Equipment/Supplies: Were Home Health Services Ordered?: Yes Name of Home Health Agency:: [facility] Has Agency set up a time to come to your home?: No EMR reviewed for Home Health Orders:  (pt states she does not want home health and when they call she will decline,  RN CM attempted to outreach home health to let them know pt not interested and line is busy, pt states she has number for [facility]) Any new equipment or medical supplies ordered?: No (pt states she already had rollator walker, has cane (currently only using cane, does not feel she needs walker))  Functional Questionnaire: Do you need assistance with bathing/showering or dressing?: Yes (shower seat) Do you need assistance with meal preparation?: No (prepares light meals, spouse assists) Do you need assistance with eating?: No Do you have difficulty maintaining continence: No Do you need assistance with getting out of bed/getting out of a chair/moving?: Yes (4 prong cane) Do you have difficulty managing or taking your medications?: No  Follow up appointments reviewed: PCP Follow-up appointment confirmed?:  (pt declines for RN CM to schedule post hospital follow up, she states she will schedule when she is ready) Specialist Hospital Follow-up appointment confirmed?: No Reason Specialist Follow-Up Not Confirmed: Patient has Specialist Provider Number and will Call for Appointment (pt states she will call to follow up with GI, hematology (states she will be having labs done there), goes to pain clinic in early June) Do you need transportation to your follow-up appointment?: No Do you understand care options if your condition(s) worsen?: Yes-patient verbalized understanding  SDOH Interventions Today    Flowsheet Row Most Recent Value  SDOH Interventions   Food  Insecurity Interventions Intervention Not Indicated  Housing Interventions Intervention Not Indicated  Transportation Interventions Intervention Not Indicated  Utilities Interventions Intervention Not Indicated       Cecilie Coffee [facility], BSN RN Care Manager/ Transition of Care [place] Population Health [phone]

## 2023-12-01 ENCOUNTER — Inpatient Hospital Stay: Attending: Hematology

## 2023-12-01 DIAGNOSIS — D509 Iron deficiency anemia, unspecified: Secondary | ICD-10-CM | POA: Diagnosis not present

## 2023-12-01 DIAGNOSIS — D500 Iron deficiency anemia secondary to blood loss (chronic): Secondary | ICD-10-CM

## 2023-12-01 LAB — CBC WITH DIFFERENTIAL/PLATELET
Abs Immature Granulocytes: 0.01 10*3/uL (ref 0.00–0.07)
Basophils Absolute: 0 10*3/uL (ref 0.0–0.1)
Basophils Relative: 1 %
Eosinophils Absolute: 0.2 10*3/uL (ref 0.0–0.5)
Eosinophils Relative: 3 %
HCT: 29.1 % — ABNORMAL LOW (ref 36.0–46.0)
Hemoglobin: 9.3 g/dL — ABNORMAL LOW (ref 12.0–15.0)
Immature Granulocytes: 0 %
Lymphocytes Relative: 20 %
Lymphs Abs: 1 10*3/uL (ref 0.7–4.0)
MCH: 29.9 pg (ref 26.0–34.0)
MCHC: 32 g/dL (ref 30.0–36.0)
MCV: 93.6 fL (ref 80.0–100.0)
Monocytes Absolute: 0.3 10*3/uL (ref 0.1–1.0)
Monocytes Relative: 6 %
Neutro Abs: 3.6 10*3/uL (ref 1.7–7.7)
Neutrophils Relative %: 70 %
Platelets: 303 10*3/uL (ref 150–400)
RBC: 3.11 MIL/uL — ABNORMAL LOW (ref 3.87–5.11)
RDW: 15.3 % (ref 11.5–15.5)
WBC: 5.1 10*3/uL (ref 4.0–10.5)
nRBC: 0 % (ref 0.0–0.2)

## 2023-12-01 LAB — IRON AND TIBC
Iron: 36 ug/dL (ref 28–170)
Saturation Ratios: 11 % (ref 10.4–31.8)
TIBC: 334 ug/dL (ref 250–450)
UIBC: 298 ug/dL

## 2023-12-01 LAB — SAMPLE TO BLOOD BANK

## 2023-12-01 LAB — FERRITIN: Ferritin: 19 ng/mL (ref 11–307)

## 2023-12-01 NOTE — Progress Notes (Unsigned)
 VIRTUAL VISIT via TELEPHONE NOTE [facility]   I connected with Haley Trevino  on 12/02/23 at  1:50 PM by telephone and verified that I am speaking with the correct person using two identifiers.  Location: Patient: Home Provider: [facility]   I discussed the limitations, risks, security and privacy concerns of performing an evaluation and management service by telephone and the availability of in person appointments. I also discussed with the patient that there may be a patient responsible charge related to this service. The patient expressed understanding and agreed to proceed.  REASON FOR VISIT:  Follow-up for iron  deficiency anemia   CURRENT THERAPY: IV iron  infusions with intermittent PRBC transfusions  INTERVAL HISTORY:  [name] (83 y.o. female) returns for routine follow-up of iron  deficiency anemia.  She was last seen by Sheril Dines PA-C on 09/28/2023.  In the interim since her last visit, Marily Shows was hospitalized from 11/22/2023 through 11/25/2023 for acute blood loss anemia and "large amount of bright red blood per rectum that filled the toilet bowl."  Seen by gastroenterology, who suspected diverticular bleeding, but managed conservatively with surveillance.  CTA done on admission was negative for active GI bleeding.  She did not require any EGD or colonoscopy during admission.  No blood transfusions or IV iron  during admission.  At today's visit, she reports feeling severe fatigue following her recent acute blood loss. She has ongoing fatigue and diffuse musculoskeletal pain from her fibromyalgia and other chronic comorbidities. She also reports vertigo, tremors, general malaise, and headaches. She had some syncope at home last month (prior to most recent GI bleed), continues to follow with cardiology and neurology. She has chronic dyspnea on exertion. She has ongoing musculoskeletal chest discomfort related to scar tissue (s/p  implant removal) and scleroderma, but denies any exertional chest pain.  She remains off of Eliquis  to date, but is taking 81 mg aspirin . She denies any recurrent hematochezia since hospital discharge last week, but did have an episode of melanotic ("dark and shiny") stool this morning.  She reports 60% energy and 50% appetite.   ASSESSMENT & PLAN:  1.    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 - Was placed on Eliquis  for new onset A-fib/flutter in November 2023, but developed profuse melanotic stool within 48 hours of starting Eliquis .  Patient self discontinued her Eliquis  and within 24 hours of stopping Eliquis  her bowel movements had returned to normal. - Severe anemia with Hgb 6.6 on 06/09/2022, therefore transfused PRBC x 2 units. - She continues to follow with GI (Dr. Lavaughn Portland via [facility]) - Most recent IV iron  with Feraheme x 3 in February 2025 - Hospitalized in May 2025 for hematochezia, but did not require blood transfusion or endoscopy/colonoscopy. - No recurrent hematochezia since discharge.  Reports possible melanotic stool this morning. - Labs (12/01/2023): Hgb 9.3/MCV 93.6, ferritin 19, iron  saturation 11%.  Previous labs have shown normal creatinine/GFR. - Differential diagnosis  favors iron  deficiency anemia secondary to chronic GI blood loss; may also be an aspect of  malabsorption in the setting of scleroderma, low meat intake, and use of PPI - PLAN: Recommend IV Monoferric x 1 (PREMED w/ IV cetirizine ) - Monthly CBC/D with BB sample - Labs (CBC/D, BB sample, ferritin, iron /TIBC) and RTC in 2 months with PHONE visit  - Recommend continued outpatient GI follow-up.  PLAN SUMMARY:  >> IV iron  with Monoferric x1 >> Monthly CBC/BB sample >> Labs in 2 months = CBC/D, ferritin, iron /TIBC, BB sample >> PHONE visit in 2 months (after labs)  ** Last office visit 09/28/23      REVIEW OF SYSTEMS:   Review of Systems  Constitutional:  Positive for malaise/fatigue. Negative for chills, diaphoresis, fever and weight loss.  Respiratory:  Positive for cough and shortness of breath.   Cardiovascular:  Positive for chest pain and palpitations.  Gastrointestinal:  Positive for abdominal pain, blood in stool, melena and nausea. Negative for vomiting.  Neurological:  Positive for dizziness and headaches.  Psychiatric/Behavioral:  Positive for depression. The patient is nervous/anxious.      PHYSICAL EXAM: (per limitations of virtual telephone visit)  The patient is alert and oriented x 3, exhibiting adequate mentation, good mood, and ability to speak in full sentences and execute sound judgement.  WRAP UP:   I discussed the assessment and treatment plan with the patient. The patient was provided an opportunity to ask questions and all were answered. The patient agreed with the plan and demonstrated an understanding of the instructions.   The patient was advised to call back or seek an in-person evaluation if the symptoms worsen or if the condition fails to improve as anticipated.  I provided 25 minutes of non-face-to-face time during this encounter, including >10 minutes of medical discussion.  Sonnie Dusky, PA-C 12/02/23 2:20 PM

## 2023-12-02 ENCOUNTER — Inpatient Hospital Stay: Admitting: Physician Assistant

## 2023-12-02 DIAGNOSIS — D500 Iron deficiency anemia secondary to blood loss (chronic): Secondary | ICD-10-CM

## 2023-12-05 DIAGNOSIS — K580 Irritable bowel syndrome with diarrhea: Secondary | ICD-10-CM | POA: Diagnosis not present

## 2023-12-05 DIAGNOSIS — I5032 Chronic diastolic (congestive) heart failure: Secondary | ICD-10-CM | POA: Diagnosis not present

## 2023-12-05 DIAGNOSIS — M349 Systemic sclerosis, unspecified: Secondary | ICD-10-CM | POA: Diagnosis not present

## 2023-12-05 DIAGNOSIS — K581 Irritable bowel syndrome with constipation: Secondary | ICD-10-CM | POA: Diagnosis not present

## 2023-12-05 DIAGNOSIS — E039 Hypothyroidism, unspecified: Secondary | ICD-10-CM | POA: Diagnosis not present

## 2023-12-05 DIAGNOSIS — D500 Iron deficiency anemia secondary to blood loss (chronic): Secondary | ICD-10-CM | POA: Diagnosis not present

## 2023-12-05 DIAGNOSIS — I272 Pulmonary hypertension, unspecified: Secondary | ICD-10-CM | POA: Diagnosis not present

## 2023-12-05 DIAGNOSIS — I442 Atrioventricular block, complete: Secondary | ICD-10-CM | POA: Diagnosis not present

## 2023-12-05 DIAGNOSIS — Z79899 Other long term (current) drug therapy: Secondary | ICD-10-CM | POA: Diagnosis not present

## 2023-12-05 DIAGNOSIS — F419 Anxiety disorder, unspecified: Secondary | ICD-10-CM | POA: Diagnosis not present

## 2023-12-05 DIAGNOSIS — K922 Gastrointestinal hemorrhage, unspecified: Secondary | ICD-10-CM | POA: Diagnosis not present

## 2023-12-05 DIAGNOSIS — E875 Hyperkalemia: Secondary | ICD-10-CM | POA: Diagnosis not present

## 2023-12-05 DIAGNOSIS — F3342 Major depressive disorder, recurrent, in full remission: Secondary | ICD-10-CM | POA: Diagnosis not present

## 2023-12-05 DIAGNOSIS — I7300 Raynaud's syndrome without gangrene: Secondary | ICD-10-CM | POA: Diagnosis not present

## 2023-12-05 DIAGNOSIS — I110 Hypertensive heart disease with heart failure: Secondary | ICD-10-CM | POA: Diagnosis not present

## 2023-12-05 DIAGNOSIS — K219 Gastro-esophageal reflux disease without esophagitis: Secondary | ICD-10-CM | POA: Diagnosis not present

## 2023-12-05 DIAGNOSIS — E785 Hyperlipidemia, unspecified: Secondary | ICD-10-CM | POA: Diagnosis not present

## 2023-12-08 ENCOUNTER — Encounter: Payer: Self-pay | Admitting: Physical Medicine and Rehabilitation

## 2023-12-08 ENCOUNTER — Encounter: Attending: Physical Medicine and Rehabilitation | Admitting: Physical Medicine and Rehabilitation

## 2023-12-08 VITALS — BP 125/57 | HR 69 | Ht 62.0 in | Wt 97.6 lb

## 2023-12-08 DIAGNOSIS — G894 Chronic pain syndrome: Secondary | ICD-10-CM | POA: Diagnosis not present

## 2023-12-08 DIAGNOSIS — M797 Fibromyalgia: Secondary | ICD-10-CM

## 2023-12-08 DIAGNOSIS — Z8719 Personal history of other diseases of the digestive system: Secondary | ICD-10-CM

## 2023-12-08 DIAGNOSIS — R42 Dizziness and giddiness: Secondary | ICD-10-CM

## 2023-12-08 MED ORDER — DULOXETINE HCL 20 MG PO CPEP
20.0000 mg | ORAL_CAPSULE | Freq: Every day | ORAL | 3 refills | Status: DC
Start: 2023-12-08 — End: 2024-02-08

## 2023-12-08 MED ORDER — TOPIRAMATE 50 MG PO TABS: 50.0000 mg | ORAL_TABLET | Freq: Every evening | ORAL | 3 refills | Status: DC

## 2023-12-08 NOTE — Progress Notes (Signed)
 Subjective:    Patient ID: Haley Trevino, female    DOB: 09/18/1940, 83 y.o.   MRN: [id number]  HPI Mrs. Mcclaskey is an 83 year old woman who presents to establish care for chronic pain.  1) Chronic pain: -failed gabapentin, lidocaine  patches, Lyrica, icyhot (lyrica and gabapentin caused leg swelling) -has not tried cymbalta -she has pain everywhere -she was diagnosed with scleroderma and fibromyalgia -not doing well  2) Left rotator cuff tear -was going to have surgery but it had do be postponed due to need for pacemaker placement  3) IBS: -symptoms have improved -avoids fried foods -has not missed chocolate or cookies -she tasted a lot of foods at Thanksgiving, including dressing -bread is her problem- she has to have it.   4) s/p multiple falls: -continues to fall -she was found to be dehydrated -she was given fluids -she does not drink enough -they scanned her carotid arteries  5) Cardiac debility: -has had decreased strength and balance afterward -she shakes all the time  6) Dizziness: -her PCP said this may be from anemia  7) History of hip fracture: -discussed that her hip has never been the same after her fracture  8) History of breast implant removal -has pain from breast implant removal  9) History of GI bleed: -her physician told her she is too old for a colonoscopy -she has restarted plavix -on Friday she is getting a 1,000mg  iron  infusion -she felt she was kept for three days for nothing as she did not get a blood transfusion or colonoscopy  10) Dizziness: -home health was sent to her house for rehab  Pain Inventory Average Pain 4 Pain Right Now 4 My pain is intermittent, constant, and sharp  In the last 24 hours, has pain interfered with the following? General activity 4 Relation with others 4 Enjoyment of life 4 What TIME of day is your pain at its worst? morning , daytime, evening, night, and varies Sleep (in general) Fair  Pain  is worse with: bending, sitting, and some activites Pain improves with: rest, heat/ice, and medication Relief from Meds: 5    Family History  Problem Relation Age of Onset   Pancreatic cancer Father    Raynaud syndrome Father    Cancer Father        pancreatic   Rashes / Skin problems Daughter        possibly scleraderma   Hip fracture Mother    Mental illness Mother        attempted suicide at 64 yo   Social History   Socioeconomic History   Marital status: Married    Spouse name: Not on file   Number of children: 2   Years of education: Not on file   Highest education level: Some college, no degree  Occupational History   Occupation: retired Holiday representative at [employer]  Tobacco Use   Smoking status: Never   Smokeless tobacco: Never   Tobacco comments:    passive tobacco smoke exposure as child  Vaping Use   Vaping status: Never Used  Substance and Sexual Activity   Alcohol use: No   Drug use: No   Sexual activity: Not Currently    Birth control/protection: Surgical  Other Topics Concern   Not on file  Social History Narrative   Regular exercise: walkingCaffeine use: 1 cup of coffee daily   Lives in split level home with her husband   Social Drivers of Corporate investment banker Strain: Low  Risk  (06/08/2023)   Overall Financial Resource Strain (CARDIA)    Difficulty of Paying Living Expenses: Not hard at all  Food Insecurity: No Food Insecurity (11/26/2023)   Hunger Vital Sign    Worried About Running Out of Food in the Last Year: Never true    Ran Out of Food in the Last Year: Never true  Transportation Needs: No Transportation Needs (11/26/2023)   PRAPARE - Administrator, Civil Service (Medical): No    Lack of Transportation (Non-Medical): No  Physical Activity: Inactive (06/08/2023)   Exercise Vital Sign    Days of Exercise per Week: 0 days    Minutes of Exercise per Session: 0 min  Stress: No Stress Concern Present (06/08/2023)   [employer] of Occupational Health - Occupational Stress Questionnaire    Feeling of Stress : Not at all  Social Connections: Socially Integrated (11/24/2023)   Social Connection and Isolation Panel [NHANES]    Frequency of Communication with Friends and Family: More than three times a week    Frequency of Social Gatherings with Friends and Family: Never    Attends Religious Services: 1 to 4 times per year    Active Member of [employer] or Organizations: No    Attends Engineer, structural: 1 to 4 times per year    Marital Status: Married   Past Surgical History:  Procedure Laterality Date   BREAST ENHANCEMENT SURGERY  1975   BREAST IMPLANT REMOVAL Bilateral 04/23/2016   Procedure: REMOVALBILATERAL BREAST IMPLANTS;  Surgeon: Lindaann Requena Dillingham, DO;  Location: [facility];  Service: Plastics;  Laterality: Bilateral;   CHOLECYSTECTOMY  1973   ESOPHAGOGASTRODUODENOSCOPY N/A 10/25/2020   Procedure: ESOPHAGOGASTRODUODENOSCOPY (EGD);  Surgeon: Ozell Blunt, MD;  Location: [facility];  Service: Endoscopy;  Laterality: N/A;  enteroscopy   HAND SURGERY  08/2012; 11/2012   HOT HEMOSTASIS N/A 10/25/2020   Procedure: HOT HEMOSTASIS (ARGON PLASMA COAGULATION/BICAP);  Surgeon: Ozell Blunt, MD;  Location: [facility];  Service: Endoscopy;  Laterality: N/A;   INTRAOPERATIVE TRANSTHORACIC ECHOCARDIOGRAM N/A 05/13/2022   Procedure: INTRAOPERATIVE TRANSTHORACIC ECHOCARDIOGRAM;  Surgeon: Odie Benne, MD;  Location: [facility];  Service: Open Heart Surgery;  Laterality: N/A;   INTRAOPERATIVE TRANSTHORACIC ECHOCARDIOGRAM N/A 05/20/2022   Procedure: INTRAOPERATIVE TRANSTHORACIC ECHOCARDIOGRAM;  Surgeon: Kyra Phy, MD;  [facility];  Service: Open Heart Surgery;  Laterality: N/A;   IR RADIOLOGIST EVAL & MGMT  07/27/2017   IR SACROPLASTY BILATERAL  07/31/2017   IR VERTEBROPLASTY CERV/THOR BX INC UNI/BIL INC/INJECT/IMAGING  03/13/2020   IR VERTEBROPLASTY EA ADDL (T&LS) BX  INC UNI/BIL INC INJECT/IMAGING  03/14/2020   MELANOMA EXCISION     at 31 yrs of age   ORIF HIP FRACTURE Right 02/22/2014   Procedure: OPEN REDUCTION INTERNAL FIXATION RIGHT HIP;  Surgeon: Pleasant Brilliant, MD;  Location: [facility];  Service: Orthopedics;  Laterality: Right;   PACEMAKER IMPLANT N/A 05/27/2022   Procedure: PACEMAKER IMPLANT;  Surgeon: Boyce Byes, MD;  Location: [facility];  Service: Cardiovascular;  Laterality: N/A;   RIGHT HEART CATH N/A 01/16/2023   Procedure: RIGHT HEART CATH;  Surgeon: Alwin Baars, DO;  Location: [facility];  Service: Cardiovascular;  Laterality: N/A;   RIGHT HEART CATH AND CORONARY ANGIOGRAPHY N/A 03/28/2022   Procedure: RIGHT HEART CATH AND CORONARY ANGIOGRAPHY;  Surgeon: Kyra Phy, MD;  Location: [facility];  Service: Cardiovascular;  Laterality: N/A;   TOTAL ABDOMINAL HYSTERECTOMY  1974   TRANSCATHETER AORTIC VALVE REPLACEMENT, TRANSFEMORAL Left 05/13/2022   Procedure: Transcatheter Aortic Valve Replacement, Transfemoral;  Surgeon: Odie Benne, MD;  Location: [facility];  Service: Open Heart Surgery;  Laterality: Left;   TRANSCATHETER AORTIC VALVE REPLACEMENT, TRANSFEMORAL Left 05/20/2022   Procedure: Transcatheter Aortic Valve Replacement, Transfemoral using an Edwards SAPIEN 3 Ultra 23 MM Heart Valve;  Surgeon: Kyra Phy, MD;  Location: [facility];  Service: Open Heart Surgery;  Laterality: Left;  Left Transfemoral   Past Surgical History:  Procedure Laterality Date   BREAST ENHANCEMENT SURGERY  1975   BREAST IMPLANT REMOVAL Bilateral 04/23/2016   Procedure: REMOVALBILATERAL BREAST IMPLANTS;  Surgeon: Thornell Flirt, DO;  Location: [facility];  Service: Plastics;  Laterality: Bilateral;   CHOLECYSTECTOMY  1973   ESOPHAGOGASTRODUODENOSCOPY N/A 10/25/2020   Procedure: ESOPHAGOGASTRODUODENOSCOPY (EGD);  Surgeon: Ozell Blunt, MD;  Location: [facility];  Service: Endoscopy;   Laterality: N/A;  enteroscopy   HAND SURGERY  08/2012; 11/2012   HOT HEMOSTASIS N/A 10/25/2020   Procedure: HOT HEMOSTASIS (ARGON PLASMA COAGULATION/BICAP);  Surgeon: Ozell Blunt, MD;  Location: [facility];  Service: Endoscopy;  Laterality: N/A;   INTRAOPERATIVE TRANSTHORACIC ECHOCARDIOGRAM N/A 05/13/2022   Procedure: INTRAOPERATIVE TRANSTHORACIC ECHOCARDIOGRAM;  Surgeon: Odie Benne, MD;  [facility];  Service: Open Heart Surgery;  Laterality: N/A;   INTRAOPERATIVE TRANSTHORACIC ECHOCARDIOGRAM N/A 05/20/2022   Procedure: INTRAOPERATIVE TRANSTHORACIC ECHOCARDIOGRAM;  Surgeon: Kyra Phy, MD;  Location: [facility] OR;  Service: Open Heart Surgery;  Laterality: N/A;   IR RADIOLOGIST EVAL & MGMT  07/27/2017   IR SACROPLASTY BILATERAL  07/31/2017   IR VERTEBROPLASTY CERV/THOR BX INC UNI/BIL INC/INJECT/IMAGING  03/13/2020   IR VERTEBROPLASTY EA ADDL (T&LS) BX INC UNI/BIL INC INJECT/IMAGING  03/14/2020   MELANOMA EXCISION     at 31 yrs of age   ORIF HIP FRACTURE Right 02/22/2014   Procedure: OPEN REDUCTION INTERNAL FIXATION RIGHT HIP;  Surgeon: Pleasant Brilliant, MD;  Location: [facility];  Service: Orthopedics;  Laterality: Right;   PACEMAKER IMPLANT N/A 05/27/2022   Procedure: PACEMAKER IMPLANT;  Surgeon: Boyce Byes, MD;  Location: [facility] CV LAB;  Service: Cardiovascular;  Laterality: N/A;   RIGHT HEART CATH N/A 01/16/2023   Procedure: RIGHT HEART CATH;  Surgeon: Alwin Baars, DO;  [facility];  Service: Cardiovascular;  Laterality: N/A;   RIGHT HEART CATH AND CORONARY ANGIOGRAPHY N/A 03/28/2022   Procedure: RIGHT HEART CATH AND CORONARY ANGIOGRAPHY;  Surgeon: Kyra Phy, MD;  [facility];  Service: Cardiovascular;  Laterality: N/A;   TOTAL ABDOMINAL HYSTERECTOMY  1974   TRANSCATHETER AORTIC VALVE REPLACEMENT, TRANSFEMORAL Left 05/13/2022   Procedure: Transcatheter Aortic Valve Replacement, Transfemoral;  Surgeon: Odie Benne, MD;  [facility];  Service: Open Heart Surgery;  Laterality: Left;   TRANSCATHETER AORTIC VALVE REPLACEMENT, TRANSFEMORAL Left 05/20/2022   Procedure: Transcatheter Aortic Valve Replacement, Transfemoral using an Edwards SAPIEN 3 Ultra 23 MM Heart Valve;  Surgeon: Kyra Phy, MD;  Location: [facility];  Service: Open Heart Surgery;  Laterality: Left;  Left Transfemoral   Past Medical History:  Diagnosis Date   Cataract    Depression    GERD (gastroesophageal reflux disease)    Hyperlipidemia    Hypertension    Hypothyroidism    IBS (irritable bowel syndrome)    Osteoarthritis    Raynaud disease    S/P TAVR (transcatheter aortic valve replacement) 05/20/2022  s/p TAVR with a 23 mm Edwards S3UR via the TF approach by Dr. Lorie Rook & Bartle   Scleroderma [facility])    Thyroid  disease    hypothyroidism   BP (!) 125/57   Pulse 69   Ht 5\' 2"  (1.575 m)   Wt 97 lb 9.6 oz (44.3 kg)   SpO2 93%   BMI 17.85 kg/m   Opioid Risk Score:   Fall Risk Score:  `1  Depression screen PHQ 2/9     12/08/2023    1:30 PM 11/26/2023   11:10 AM 07/20/2023   10:08 AM 06/08/2023    8:08 AM 05/29/2023   11:38 AM 03/30/2023   10:34 AM 02/09/2023    2:04 PM  Depression screen PHQ 2/9  Decreased Interest 0 0 0 0 0 2 0  Down, Depressed, Hopeless 0 0 0 0 0 2 0  PHQ - 2 Score 0 0 0 0 0 4 0  Altered sleeping   0 0 0 2 0  Tired, decreased energy   0 0 0 2 0  Change in appetite   0 0 0 2 0  Feeling bad or failure about yourself    0 0 0 0 0  Trouble concentrating   0 0 0 0 0  Moving slowly or fidgety/restless   0 0 0 0 0  Suicidal thoughts   0 0 0 0 0  PHQ-9 Score   0 0 0 10 0  Difficult doing work/chores   Not difficult at all Not difficult at all Not difficult at all  Not difficult at all    Review of Systems  Constitutional:  Positive for appetite change.       Poor appetite  Respiratory:  Positive for cough and shortness of breath.   Gastrointestinal:  Positive for  nausea.  Neurological:  Positive for dizziness, tremors and weakness.  Psychiatric/Behavioral:         Depression, Anxiety  All other systems reviewed and are negative.      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  -Discussed benefits of exercise in reducing pain. -discussed that exercise can be helpful for fibromyalgia -discussed she does not like pain medicine but sometimes she needs it -referred to PT, discussed that she loved going there but the PT is too costly -recommended exercise bike at home.   -cymbalta 20mg  daily started, discussed risks and benefits  -discussed mechanism of action of low dose naltrexone as an opioid receptor antagonist which stimulates your body's production of its own natural endogenous opioids, helping to decrease pain. Discussed that it can also decrease T cell response and thus be helpful in decreasing inflammation, and symptoms of brain fog, fatigue, anxiety, depression, and allergies. Discussed that this medication needs to be compounded at a compounding pharmacy and can more expensive. Discussed that I usually start at 1mg  and if this is not providing enough relief then I titrate upward on a monthly basis.    -food sensitivity  testing ordered -Discussed current symptoms of pain and history of pain.  -Discussed benefits of exercise in reducing pain. -Discussed following foods that may reduce  pain: 1) Ginger (especially studied for arthritis)- reduce leukotriene production to decrease inflammation 2) Blueberries- high in phytonutrients that decrease inflammation 3) Salmon- marine omega-3s reduce joint swelling and pain 4) Pumpkin seeds- reduce inflammation 5) dark chocolate- reduces inflammation 6) turmeric- reduces inflammation 7) tart cherries - reduce pain and stiffness 8) extra virgin olive oil - its compound olecanthal helps to block prostaglandins  9) chili peppers- can be eaten or applied topically via capsaicin 10) mint- helpful for headache, muscle aches, joint pain, and itching 11) garlic- reduces inflammation  Link to further information on diet for chronic pain: http://www.bray.com/   Benefits of turmeric:  -Highly anti-inflammatory  -Increases antioxidants  -Improves memory, attention, brain disease  -Lowers risk of heart disease  -May help prevent cancer  -Decreases pain  -Alleviates depression  -Delays aging and decreases risk of chronic disease  -Consume with black pepper to increase absorption    Turmeric Milk Recipe:  1 cup milk  1 tsp turmeric  1 tsp cinnamon  1 tsp grated ginger (optional)  Black pepper (boosts the anti-inflammatory properties of turmeric).  1 tsp honey    2) Right rotator cuff tear: -discussed her plan for surgery.  -discussed that she does not want to try physical therapy since it hurts so much  3) Bilateral leg weakness -referred for PT  4) pacemaker: -discussed her recent pacemaker placement  5) Food sensitivity/IBS: -discussed her food sensitivities and recommended avoiding wheat, dairy, and corn, discussed that she likes mac and cheese -discussed her history of IBS -discussed that she loves bread and it is hard for her to start this -discussed that she likes sourdough -discussed that she finds gluten free bread  disgusting -reviewed her dietary preferences and discussed good and bad choices -discussed that she has not been doing well with avoiding inflammatory foods  6) Fall: -discussed that is usually 2/2 to dehydration -reviewed current medications  7) Tremor: -discussed that she has not seen a neurologist -discussed that this started after her cardiac surgeries in 11/23  8) Anxiety: -continue klonopin  0.5mg  BID prn, discussed that if she wants to wean off she can break the morning dose in hald -discussed that she has been through behavioral therapy for a long time -Discussed exercise and meditation as tools to decrease anxiety. -Recommended Down Dog Yoga app -Discussed spending time outdoors. -Discussed positive re-framing of anxiety.  -Discussed the following foods that have been show to reduce anxiety: 1) [place] nuts, mushrooms, soy beans due to their high selenium content. Upper limit of toxicity of selenium is 415mcg/day so no more than 3-4 [place] nuts per day.  2) Fatty fish such as salmon, mackerel, sardines, trout, and herring- high in omega-3 fatty acids 3) Eggs- increases serotonin and dopamine 4) Pumpkin seeds- high in omega-3 fatty acids 5) dark chocolate- high in flavanols that increase blood flow to brain 6) turmeric- take with black pepper to increase absorption 7) chamomile tea- antioxidant and anti-inflammatory properties 8) yogurt without sugar- supports gut-brain axis 9) green tea- contains L- theanine 10) blueberries- high in vitamin C and antioxidants 11) [place]- high in tryptophan which gets converted to serotonin 12) bell peppers- rich in vitamin C and antioxidants 13) citrus fruits- rich in vitamin C and antioxidants 14) almonds- high in vitamin E and healthy fats 15) chia seeds- high in omega-3 fatty acids  9)  Debility s/p cardiac surgeries: -discuss cardiac rehab but it would be expensive for her  10) Dizziness: -discussed that this could be secondary to  her cardiac medications -continue drinking water, recommended celery juice -continue home therapy -discussed that she does not feel the room is spinning -discussed that it feels her balance is off -discussed she has an appointment next month to get her eyes checked  11) Hip fracture: -discussed that her hip has never been the same since her hip fracture -discussed that lower extremity strength was improving with therapy  12) Left shoulder pain: -discussed that she does not want to take pain medication  -Discussed Qutenza as an option for neuropathic pain control. Discussed that this is a capsaicin patch, stronger than capsaicin cream. Discussed that it is currently approved for diabetic peripheral neuropathy and post-herpetic neuralgia, but that it has also shown benefit in treating other forms of neuropathy. Provided patient with link to site to learn more about the patch: https://www.clark.biz/. Discussed that the patch would be placed in office and benefits usually last 3 months. Discussed that unintended exposure to capsaicin can cause severe irritation of eyes, mucous membranes, respiratory tract, and skin, but that Qutenza is a local treatment and does not have the systemic side effects of other nerve medications. Discussed that there may be pain, itching, erythema, and decreased sensory function associated with the application of Qutenza. Side effects usually subside within 1 week. A cold pack of analgesic medications can help with these side effects. Blood pressure can also be increased due to pain associated with administration of the patch.   -discussed cymbalta  -discussed that she has failed gabapentin, lyrica, IcyHot  13) History of breast implant removal: -discussed that she had silica breast implants and their removal can cause diffusion of the silica in her body -recommended drinking celery juice daily  14) HTN: -Advised checking BP daily at home and logging results to  bring into follow-up appointment with PCP and myself. -Reviewed BP meds today.  -Advised regarding healthy foods that can help lower blood pressure and provided with a list: 1) citrus foods- high in vitamins and minerals 2) salmon and other fatty fish - reduces inflammation and oxylipins 3) swiss chard (leafy green)- high level of nitrates 4) pumpkin seeds- one of the best natural sources of magnesium  5) Beans and lentils- high in fiber, magnesium , and potassium 6) Berries- high in flavonoids 7) Amaranth (whole grain, can be cooked similarly to rice and oats)- high in magnesium  and fiber 8) Pistachios- even more effective at reducing BP than other nuts 9) Carrots- high in phenolic compounds that relax blood vessels and reduce inflammation 10) Celery- contain phthalides that relax tissues of arterial walls 11) Tomatoes- can also improve cholesterol and reduce risk of heart disease 12) Broccoli- good source of magnesium , calcium , and potassium 13) Greek yogurt: high in potassium and calcium  14) Herbs and spices: Celery seed, cilantro, saffron, lemongrass, black cumin, ginseng, cinnamon, cardamom, sweet basil, and ginger 15) Chia and flax seeds- also help to lower cholesterol and blood sugar 16) Beets- high levels of nitrates that relax blood vessels  17) spinach and bananas- high in potassium  -Provided lise of supplements that can help with hypertension:  1) magnesium : one high quality brand is Bioptemizers since it contains all 7 types of magnesium , otherwise over the counter magnesium  gluconate 400mg  is a good option 2) B vitamins 3) vitamin D  4) potassium 5) CoQ10 6) L-arginine 7) Vitamin C 8) Beetroot -Educated that goal BP  is 120/80. -Made goal to incorporate some of the above foods into diet.    15) Migraines: Increase topamax  to 50mg  HS  16) Depression: -continue Prozac  -discussed Cymablta  17) Recent GI bleed: -discussed her recent experience with this

## 2023-12-11 ENCOUNTER — Inpatient Hospital Stay: Attending: Hematology

## 2023-12-11 VITALS — BP 138/53 | HR 62 | Temp 98.1°F | Resp 18

## 2023-12-11 DIAGNOSIS — D500 Iron deficiency anemia secondary to blood loss (chronic): Secondary | ICD-10-CM

## 2023-12-11 DIAGNOSIS — D509 Iron deficiency anemia, unspecified: Secondary | ICD-10-CM | POA: Diagnosis not present

## 2023-12-11 MED ORDER — SODIUM CHLORIDE 0.9 % IV SOLN
1000.0000 mg | Freq: Once | INTRAVENOUS | Status: AC
  Administered 2023-12-11: 1000 mg via INTRAVENOUS
  Filled 2023-12-11: qty 1000

## 2023-12-11 MED ORDER — SODIUM CHLORIDE 0.9 % IV SOLN: INTRAVENOUS | Status: DC

## 2023-12-11 NOTE — Patient Instructions (Signed)
 [facility] [place] - A DEPT OF [facility]  Discharge Instructions: Thank you for choosing [facility] to provide your oncology and hematology care.  If you have a lab appointment with the Cancer Center - please note that after April 8th, 2024, all labs will be drawn in the cancer center.  You do not have to check in or register with the main entrance as you have in the past but will complete your check-in in the cancer center.  Wear comfortable clothing and clothing appropriate for easy access to any Portacath or PICC line.   We strive to give you quality time with your provider. You may need to reschedule your appointment if you arrive late (15 or more minutes).  Arriving late affects you and other patients whose appointments are after yours.  Also, if you miss three or more appointments without notifying the office, you may be dismissed from the clinic at the provider's discretion.      For prescription refill requests, have your pharmacy contact our office and allow 72 hours for refills to be completed.    Today you received the following chemotherapy and/or immunotherapy agents Monoferric      To help prevent nausea and vomiting after your treatment, we encourage you to take your nausea medication as directed.  BELOW ARE SYMPTOMS THAT SHOULD BE REPORTED IMMEDIATELY: *FEVER GREATER THAN 100.4 F (38 C) OR HIGHER *CHILLS OR SWEATING *NAUSEA AND VOMITING THAT IS NOT CONTROLLED WITH YOUR NAUSEA MEDICATION *UNUSUAL SHORTNESS OF BREATH *UNUSUAL BRUISING OR BLEEDING *URINARY PROBLEMS (pain or burning when urinating, or frequent urination) *BOWEL PROBLEMS (unusual diarrhea, constipation, pain near the anus) TENDERNESS IN MOUTH AND THROAT WITH OR WITHOUT PRESENCE OF ULCERS (sore throat, sores in mouth, or a toothache) UNUSUAL RASH, SWELLING OR PAIN  UNUSUAL VAGINAL DISCHARGE OR ITCHING   Items with * indicate a potential emergency and should be followed  up as soon as possible or go to the Emergency Department if any problems should occur.  Please show the CHEMOTHERAPY ALERT CARD or IMMUNOTHERAPY ALERT CARD at check-in to the Emergency Department and triage nurse.  Should you have questions after your visit or need to cancel or reschedule your appointment, please contact [facility] CANCER CTR [place] - A DEPT OF Eligha Brideg[facility] [phone]  and follow the prompts.  Office hours are 8:00 a.m. to 4:30 p.m. Monday - Friday. Please note that voicemails left after 4:00 p.m. may not be returned until the following business day.  We are closed weekends and major holidays. You have access to a nurse at all times for urgent questions. Please call the main number to the clinic [phone] and follow the prompts.  For any non-urgent questions, you may also contact your provider using MyChart. We now offer e-Visits for anyone 54 and older to request care online for non-urgent symptoms. For details visit [email or web address].   Also download the MyChart app! Go to the app store, search "MyChart", open the app, select [place], and log in with your MyChart username and password.

## 2023-12-11 NOTE — Progress Notes (Signed)
 Patient presents today fro Monoferric infusion per providers order.  Vital signs WNL.  Patient took premedications at home.  No new complaints at this time.  Peripheral IV started and blood return noted pre and post infusion.  Stable during infusion without adverse affects.  Vital signs stable.  No complaints at this time.  Discharge from clinic ambulatory in stable condition.  Alert and oriented X 3.  Follow up with [facility] as scheduled.

## 2023-12-15 ENCOUNTER — Ambulatory Visit (INDEPENDENT_AMBULATORY_CARE_PROVIDER_SITE_OTHER)

## 2023-12-15 DIAGNOSIS — I7300 Raynaud's syndrome without gangrene: Secondary | ICD-10-CM

## 2023-12-15 DIAGNOSIS — D500 Iron deficiency anemia secondary to blood loss (chronic): Secondary | ICD-10-CM

## 2023-12-15 DIAGNOSIS — I272 Pulmonary hypertension, unspecified: Secondary | ICD-10-CM

## 2023-12-15 DIAGNOSIS — I5032 Chronic diastolic (congestive) heart failure: Secondary | ICD-10-CM | POA: Diagnosis not present

## 2023-12-15 DIAGNOSIS — M349 Systemic sclerosis, unspecified: Secondary | ICD-10-CM

## 2023-12-15 DIAGNOSIS — K580 Irritable bowel syndrome with diarrhea: Secondary | ICD-10-CM

## 2023-12-15 DIAGNOSIS — I442 Atrioventricular block, complete: Secondary | ICD-10-CM

## 2023-12-15 DIAGNOSIS — K922 Gastrointestinal hemorrhage, unspecified: Secondary | ICD-10-CM

## 2023-12-15 DIAGNOSIS — I110 Hypertensive heart disease with heart failure: Secondary | ICD-10-CM

## 2023-12-15 DIAGNOSIS — F3342 Major depressive disorder, recurrent, in full remission: Secondary | ICD-10-CM | POA: Diagnosis not present

## 2023-12-15 DIAGNOSIS — F419 Anxiety disorder, unspecified: Secondary | ICD-10-CM

## 2023-12-15 DIAGNOSIS — E039 Hypothyroidism, unspecified: Secondary | ICD-10-CM | POA: Diagnosis not present

## 2023-12-16 DIAGNOSIS — Q2733 Arteriovenous malformation of digestive system vessel: Secondary | ICD-10-CM | POA: Diagnosis not present

## 2023-12-16 DIAGNOSIS — D509 Iron deficiency anemia, unspecified: Secondary | ICD-10-CM | POA: Diagnosis not present

## 2023-12-16 DIAGNOSIS — K921 Melena: Secondary | ICD-10-CM | POA: Diagnosis not present

## 2023-12-17 DIAGNOSIS — I7300 Raynaud's syndrome without gangrene: Secondary | ICD-10-CM | POA: Diagnosis not present

## 2023-12-17 DIAGNOSIS — M349 Systemic sclerosis, unspecified: Secondary | ICD-10-CM | POA: Diagnosis not present

## 2023-12-17 DIAGNOSIS — I110 Hypertensive heart disease with heart failure: Secondary | ICD-10-CM | POA: Diagnosis not present

## 2023-12-17 DIAGNOSIS — I5032 Chronic diastolic (congestive) heart failure: Secondary | ICD-10-CM | POA: Diagnosis not present

## 2023-12-17 DIAGNOSIS — K922 Gastrointestinal hemorrhage, unspecified: Secondary | ICD-10-CM | POA: Diagnosis not present

## 2023-12-17 DIAGNOSIS — I442 Atrioventricular block, complete: Secondary | ICD-10-CM | POA: Diagnosis not present

## 2023-12-20 LAB — BASIC METABOLIC PANEL WITH GFR

## 2023-12-21 ENCOUNTER — Ambulatory Visit: Payer: Self-pay | Admitting: Cardiology

## 2023-12-23 DIAGNOSIS — K922 Gastrointestinal hemorrhage, unspecified: Secondary | ICD-10-CM | POA: Diagnosis not present

## 2023-12-23 DIAGNOSIS — I442 Atrioventricular block, complete: Secondary | ICD-10-CM | POA: Diagnosis not present

## 2023-12-23 DIAGNOSIS — I5032 Chronic diastolic (congestive) heart failure: Secondary | ICD-10-CM | POA: Diagnosis not present

## 2023-12-23 DIAGNOSIS — M349 Systemic sclerosis, unspecified: Secondary | ICD-10-CM | POA: Diagnosis not present

## 2023-12-23 DIAGNOSIS — I7300 Raynaud's syndrome without gangrene: Secondary | ICD-10-CM | POA: Diagnosis not present

## 2023-12-23 DIAGNOSIS — I110 Hypertensive heart disease with heart failure: Secondary | ICD-10-CM | POA: Diagnosis not present

## 2023-12-25 ENCOUNTER — Ambulatory Visit (INDEPENDENT_AMBULATORY_CARE_PROVIDER_SITE_OTHER): Payer: Medicare Other

## 2023-12-25 DIAGNOSIS — I442 Atrioventricular block, complete: Secondary | ICD-10-CM | POA: Diagnosis not present

## 2023-12-25 LAB — CUP PACEART REMOTE DEVICE CHECK
Battery Remaining Longevity: 112 mo
Battery Remaining Percentage: 88 %
Battery Voltage: 3.01 V
Brady Statistic AP VP Percent: 1 %
Brady Statistic AP VS Percent: 2.4 %
Brady Statistic AS VP Percent: 1.9 %
Brady Statistic AS VS Percent: 95 %
Brady Statistic RA Percent Paced: 2.4 %
Brady Statistic RV Percent Paced: 2.1 %
Date Time Interrogation Session: 20250620040015
Implantable Lead Connection Status: 753985
Implantable Lead Connection Status: 753985
Implantable Lead Implant Date: 20231121
Implantable Lead Implant Date: 20231121
Implantable Lead Location: 753859
Implantable Lead Location: 753860
Implantable Pulse Generator Implant Date: 20231121
Lead Channel Impedance Value: 380 Ohm
Lead Channel Impedance Value: 390 Ohm
Lead Channel Pacing Threshold Amplitude: 0.625 V
Lead Channel Pacing Threshold Amplitude: 0.875 V
Lead Channel Pacing Threshold Pulse Width: 0.4 ms
Lead Channel Pacing Threshold Pulse Width: 0.5 ms
Lead Channel Sensing Intrinsic Amplitude: 1 mV
Lead Channel Sensing Intrinsic Amplitude: 3.7 mV
Lead Channel Setting Pacing Amplitude: 1.125
Lead Channel Setting Pacing Amplitude: 1.625
Lead Channel Setting Pacing Pulse Width: 0.5 ms
Lead Channel Setting Sensing Sensitivity: 2 mV
Pulse Gen Model: 2272
Pulse Gen Serial Number: 6860174

## 2023-12-28 ENCOUNTER — Ambulatory Visit: Payer: Self-pay | Admitting: Cardiology

## 2023-12-29 DIAGNOSIS — I442 Atrioventricular block, complete: Secondary | ICD-10-CM | POA: Diagnosis not present

## 2023-12-29 DIAGNOSIS — I5032 Chronic diastolic (congestive) heart failure: Secondary | ICD-10-CM | POA: Diagnosis not present

## 2023-12-29 DIAGNOSIS — I7300 Raynaud's syndrome without gangrene: Secondary | ICD-10-CM | POA: Diagnosis not present

## 2023-12-29 DIAGNOSIS — K922 Gastrointestinal hemorrhage, unspecified: Secondary | ICD-10-CM | POA: Diagnosis not present

## 2023-12-29 DIAGNOSIS — I110 Hypertensive heart disease with heart failure: Secondary | ICD-10-CM | POA: Diagnosis not present

## 2023-12-29 DIAGNOSIS — M349 Systemic sclerosis, unspecified: Secondary | ICD-10-CM | POA: Diagnosis not present

## 2023-12-30 DIAGNOSIS — D509 Iron deficiency anemia, unspecified: Secondary | ICD-10-CM | POA: Diagnosis not present

## 2023-12-31 ENCOUNTER — Inpatient Hospital Stay

## 2023-12-31 ENCOUNTER — Telehealth: Payer: Self-pay

## 2023-12-31 DIAGNOSIS — D509 Iron deficiency anemia, unspecified: Secondary | ICD-10-CM | POA: Diagnosis not present

## 2023-12-31 DIAGNOSIS — D500 Iron deficiency anemia secondary to blood loss (chronic): Secondary | ICD-10-CM

## 2023-12-31 LAB — CBC
HCT: 33.5 % — ABNORMAL LOW (ref 36.0–46.0)
Hemoglobin: 10.5 g/dL — ABNORMAL LOW (ref 12.0–15.0)
MCH: 29.8 pg (ref 26.0–34.0)
MCHC: 31.3 g/dL (ref 30.0–36.0)
MCV: 95.2 fL (ref 80.0–100.0)
Platelets: 297 10*3/uL (ref 150–400)
RBC: 3.52 MIL/uL — ABNORMAL LOW (ref 3.87–5.11)
RDW: 17.2 % — ABNORMAL HIGH (ref 11.5–15.5)
WBC: 5 10*3/uL (ref 4.0–10.5)
nRBC: 0 % (ref 0.0–0.2)

## 2023-12-31 LAB — SAMPLE TO BLOOD BANK

## 2023-12-31 NOTE — Telephone Encounter (Signed)
 Copied from CRM [id number]. Topic: General - Other >> Dec 31, 2023  4:06 PM Tiffany S wrote: Reason for CRM: Patient does not OT services per evaluation  Haley Trevino  [phone]

## 2024-01-01 NOTE — Telephone Encounter (Signed)
 OT came earlier in the week. Services no longer needed. Just needs to make MMM aware. Pt states that she has a migraine today and she is declining any services today. Pt also had pill camera egd and three places need to be treated. Pt just wanted to let MMM know.

## 2024-01-02 ENCOUNTER — Encounter (HOSPITAL_COMMUNITY): Payer: Self-pay | Admitting: Interventional Radiology

## 2024-01-04 DIAGNOSIS — M349 Systemic sclerosis, unspecified: Secondary | ICD-10-CM | POA: Diagnosis not present

## 2024-01-04 DIAGNOSIS — I442 Atrioventricular block, complete: Secondary | ICD-10-CM | POA: Diagnosis not present

## 2024-01-04 DIAGNOSIS — I110 Hypertensive heart disease with heart failure: Secondary | ICD-10-CM | POA: Diagnosis not present

## 2024-01-04 DIAGNOSIS — K580 Irritable bowel syndrome with diarrhea: Secondary | ICD-10-CM | POA: Diagnosis not present

## 2024-01-04 DIAGNOSIS — I5032 Chronic diastolic (congestive) heart failure: Secondary | ICD-10-CM | POA: Diagnosis not present

## 2024-01-04 DIAGNOSIS — K581 Irritable bowel syndrome with constipation: Secondary | ICD-10-CM | POA: Diagnosis not present

## 2024-01-04 DIAGNOSIS — K219 Gastro-esophageal reflux disease without esophagitis: Secondary | ICD-10-CM | POA: Diagnosis not present

## 2024-01-04 DIAGNOSIS — E785 Hyperlipidemia, unspecified: Secondary | ICD-10-CM | POA: Diagnosis not present

## 2024-01-04 DIAGNOSIS — I7300 Raynaud's syndrome without gangrene: Secondary | ICD-10-CM | POA: Diagnosis not present

## 2024-01-04 DIAGNOSIS — K922 Gastrointestinal hemorrhage, unspecified: Secondary | ICD-10-CM | POA: Diagnosis not present

## 2024-01-04 DIAGNOSIS — E875 Hyperkalemia: Secondary | ICD-10-CM | POA: Diagnosis not present

## 2024-01-04 DIAGNOSIS — I272 Pulmonary hypertension, unspecified: Secondary | ICD-10-CM | POA: Diagnosis not present

## 2024-01-04 DIAGNOSIS — E039 Hypothyroidism, unspecified: Secondary | ICD-10-CM | POA: Diagnosis not present

## 2024-01-04 DIAGNOSIS — F3342 Major depressive disorder, recurrent, in full remission: Secondary | ICD-10-CM | POA: Diagnosis not present

## 2024-01-04 DIAGNOSIS — D500 Iron deficiency anemia secondary to blood loss (chronic): Secondary | ICD-10-CM | POA: Diagnosis not present

## 2024-01-04 DIAGNOSIS — Z79899 Other long term (current) drug therapy: Secondary | ICD-10-CM | POA: Diagnosis not present

## 2024-01-04 DIAGNOSIS — F419 Anxiety disorder, unspecified: Secondary | ICD-10-CM | POA: Diagnosis not present

## 2024-01-11 DIAGNOSIS — H16223 Keratoconjunctivitis sicca, not specified as Sjogren's, bilateral: Secondary | ICD-10-CM | POA: Diagnosis not present

## 2024-01-11 DIAGNOSIS — H52203 Unspecified astigmatism, bilateral: Secondary | ICD-10-CM | POA: Diagnosis not present

## 2024-01-13 ENCOUNTER — Telehealth: Payer: Self-pay | Admitting: *Deleted

## 2024-01-13 DIAGNOSIS — I110 Hypertensive heart disease with heart failure: Secondary | ICD-10-CM | POA: Diagnosis not present

## 2024-01-13 DIAGNOSIS — I442 Atrioventricular block, complete: Secondary | ICD-10-CM | POA: Diagnosis not present

## 2024-01-13 DIAGNOSIS — M349 Systemic sclerosis, unspecified: Secondary | ICD-10-CM | POA: Diagnosis not present

## 2024-01-13 DIAGNOSIS — I5032 Chronic diastolic (congestive) heart failure: Secondary | ICD-10-CM | POA: Diagnosis not present

## 2024-01-13 DIAGNOSIS — K922 Gastrointestinal hemorrhage, unspecified: Secondary | ICD-10-CM | POA: Diagnosis not present

## 2024-01-13 DIAGNOSIS — I7300 Raynaud's syndrome without gangrene: Secondary | ICD-10-CM | POA: Diagnosis not present

## 2024-01-13 NOTE — Telephone Encounter (Signed)
 Patient called to advise that she recently had an endoscopy where 3 AVM's were noted. She said she woke up this morning feeling like she does when her iron  is low.  Will have her come in tomorrow to have labs drawn and will consult with practitioner.

## 2024-01-14 ENCOUNTER — Inpatient Hospital Stay: Attending: Hematology

## 2024-01-14 DIAGNOSIS — D500 Iron deficiency anemia secondary to blood loss (chronic): Secondary | ICD-10-CM

## 2024-01-14 DIAGNOSIS — D509 Iron deficiency anemia, unspecified: Secondary | ICD-10-CM | POA: Insufficient documentation

## 2024-01-14 LAB — CBC WITH DIFFERENTIAL/PLATELET
Abs Immature Granulocytes: 0.02 K/uL (ref 0.00–0.07)
Basophils Absolute: 0 K/uL (ref 0.0–0.1)
Basophils Relative: 1 %
Eosinophils Absolute: 0.1 K/uL (ref 0.0–0.5)
Eosinophils Relative: 2 %
HCT: 30.4 % — ABNORMAL LOW (ref 36.0–46.0)
Hemoglobin: 11 g/dL — ABNORMAL LOW (ref 12.0–15.0)
Immature Granulocytes: 0 %
Lymphocytes Relative: 12 %
Lymphs Abs: 0.8 K/uL (ref 0.7–4.0)
MCH: 36.1 pg — ABNORMAL HIGH (ref 26.0–34.0)
MCHC: 36.2 g/dL — ABNORMAL HIGH (ref 30.0–36.0)
MCV: 99.7 fL (ref 80.0–100.0)
Monocytes Absolute: 0.4 K/uL (ref 0.1–1.0)
Monocytes Relative: 5 %
Neutro Abs: 5.4 K/uL (ref 1.7–7.7)
Neutrophils Relative %: 80 %
Platelets: 227 K/uL (ref 150–400)
RBC: 3.05 MIL/uL — ABNORMAL LOW (ref 3.87–5.11)
RDW: 17.3 % — ABNORMAL HIGH (ref 11.5–15.5)
WBC: 6.8 K/uL (ref 4.0–10.5)
nRBC: 0 % (ref 0.0–0.2)

## 2024-01-14 LAB — SAMPLE TO BLOOD BANK

## 2024-01-14 LAB — IRON AND TIBC
Iron: 46 ug/dL (ref 28–170)
Saturation Ratios: 15 % (ref 10.4–31.8)
TIBC: 310 ug/dL (ref 250–450)
UIBC: 264 ug/dL

## 2024-01-14 LAB — FERRITIN: Ferritin: 56 ng/mL (ref 11–307)

## 2024-01-15 NOTE — Progress Notes (Signed)
 Message received from Delon Hope, NP- Her levels have dropped but I believe she had a GI bleed a month or so ago. It looks like she gets IV Feraheme and she is probably in need of 2 doses.   Called patient and she is aware and agreeable with plan to get IV iron  2 doses. Transferred to up front scheduler to have appointments scheduled per Delon Hope, NP orders.

## 2024-01-19 ENCOUNTER — Inpatient Hospital Stay

## 2024-01-19 VITALS — BP 115/48 | HR 60 | Temp 96.8°F | Resp 18

## 2024-01-19 DIAGNOSIS — D509 Iron deficiency anemia, unspecified: Secondary | ICD-10-CM | POA: Diagnosis not present

## 2024-01-19 DIAGNOSIS — D500 Iron deficiency anemia secondary to blood loss (chronic): Secondary | ICD-10-CM

## 2024-01-19 MED ORDER — ACETAMINOPHEN 325 MG PO TABS
650.0000 mg | ORAL_TABLET | Freq: Once | ORAL | Status: DC
Start: 2024-01-19 — End: 2024-01-19

## 2024-01-19 MED ORDER — SODIUM CHLORIDE 0.9 % IV SOLN: INTRAVENOUS | Status: DC

## 2024-01-19 MED ORDER — CETIRIZINE HCL 10 MG/ML IV SOLN
5.0000 mg | Freq: Once | INTRAVENOUS | Status: DC
Start: 2024-01-19 — End: 2024-01-19

## 2024-01-19 MED ORDER — SODIUM CHLORIDE 0.9 % IV SOLN
1000.0000 mg | Freq: Once | INTRAVENOUS | Status: AC
  Administered 2024-01-19: 1000 mg via INTRAVENOUS
  Filled 2024-01-19: qty 1000

## 2024-01-19 NOTE — Patient Instructions (Signed)
 [facility] [place] - A DEPT OF [facility]  Discharge Instructions: Thank you for choosing [facility] to provide your oncology and hematology care.  If you have a lab appointment with the Cancer Center - please note that after April 8th, 2024, all labs will be drawn in the cancer center.  You do not have to check in or register with the main entrance as you have in the past but will complete your check-in in the cancer center.  Wear comfortable clothing and clothing appropriate for easy access to any Portacath or PICC line.   We strive to give you quality time with your provider. You may need to reschedule your appointment if you arrive late (15 or more minutes).  Arriving late affects you and other patients whose appointments are after yours.  Also, if you miss three or more appointments without notifying the office, you may be dismissed from the clinic at the provider's discretion.      For prescription refill requests, have your pharmacy contact our office and allow 72 hours for refills to be completed.    Today you received Monoferric  1,000 mg IV iron  infusion.     BELOW ARE SYMPTOMS THAT SHOULD BE REPORTED IMMEDIATELY: *FEVER GREATER THAN 100.4 F (38 C) OR HIGHER *CHILLS OR SWEATING *NAUSEA AND VOMITING THAT IS NOT CONTROLLED WITH YOUR NAUSEA MEDICATION *UNUSUAL SHORTNESS OF BREATH *UNUSUAL BRUISING OR BLEEDING *URINARY PROBLEMS (pain or burning when urinating, or frequent urination) *BOWEL PROBLEMS (unusual diarrhea, constipation, pain near the anus) TENDERNESS IN MOUTH AND THROAT WITH OR WITHOUT PRESENCE OF ULCERS (sore throat, sores in mouth, or a toothache) UNUSUAL RASH, SWELLING OR PAIN  UNUSUAL VAGINAL DISCHARGE OR ITCHING   Items with * indicate a potential emergency and should be followed up as soon as possible or go to the Emergency Department if any problems should occur.  Please show the CHEMOTHERAPY ALERT CARD or IMMUNOTHERAPY  ALERT CARD at check-in to the Emergency Department and triage nurse.  Should you have questions after your visit or need to cancel or reschedule your appointment, please contact [facility] CANCER CTR [place] - A DEPT OF [facility] [phone]  and follow the prompts.  Office hours are 8:00 a.m. to 4:30 p.m. Monday - Friday. Please note that voicemails left after 4:00 p.m. may not be returned until the following business day.  We are closed weekends and major holidays. You have access to a nurse at all times for urgent questions. Please call the main number to the clinic [phone] and follow the prompts.  For any non-urgent questions, you may also contact your provider using MyChart. We now offer e-Visits for anyone 41 and older to request care online for non-urgent symptoms. For details visit [email or web address].   Also download the MyChart app! Go to the app store, search MyChart, open the app, select [place], and log in with your MyChart username and password.

## 2024-01-19 NOTE — Progress Notes (Signed)
 Patient presents today for iron infusion.  Patient is in satisfactory condition with no new complaints voiced.  Vital signs are stable.  We will proceed with infusion per provider orders.    Peripheral IV started with good blood return pre and post infusion.  Treatment given today per MD orders. Tolerated infusion without adverse affects. Vital signs stable. No complaints at this time. [name] Alert and oriented x 3. F/U with [facility] as scheduled.

## 2024-01-20 DIAGNOSIS — I110 Hypertensive heart disease with heart failure: Secondary | ICD-10-CM | POA: Diagnosis not present

## 2024-01-20 DIAGNOSIS — I7300 Raynaud's syndrome without gangrene: Secondary | ICD-10-CM | POA: Diagnosis not present

## 2024-01-20 DIAGNOSIS — M349 Systemic sclerosis, unspecified: Secondary | ICD-10-CM | POA: Diagnosis not present

## 2024-01-20 DIAGNOSIS — I442 Atrioventricular block, complete: Secondary | ICD-10-CM | POA: Diagnosis not present

## 2024-01-20 DIAGNOSIS — I5032 Chronic diastolic (congestive) heart failure: Secondary | ICD-10-CM | POA: Diagnosis not present

## 2024-01-20 DIAGNOSIS — K922 Gastrointestinal hemorrhage, unspecified: Secondary | ICD-10-CM | POA: Diagnosis not present

## 2024-01-26 ENCOUNTER — Inpatient Hospital Stay

## 2024-01-26 ENCOUNTER — Other Ambulatory Visit: Payer: Self-pay | Admitting: Oncology

## 2024-01-26 VITALS — BP 126/47 | HR 70 | Temp 97.7°F | Resp 19 | Wt 100.1 lb

## 2024-01-26 DIAGNOSIS — D500 Iron deficiency anemia secondary to blood loss (chronic): Secondary | ICD-10-CM

## 2024-01-26 DIAGNOSIS — D509 Iron deficiency anemia, unspecified: Secondary | ICD-10-CM | POA: Diagnosis not present

## 2024-01-26 DIAGNOSIS — D649 Anemia, unspecified: Secondary | ICD-10-CM

## 2024-01-26 MED ORDER — SODIUM CHLORIDE 0.9 % IV SOLN
1000.0000 mg | Freq: Once | INTRAVENOUS | Status: AC
  Administered 2024-01-26: 1000 mg via INTRAVENOUS
  Filled 2024-01-26: qty 10

## 2024-01-26 MED ORDER — ACETAMINOPHEN 325 MG PO TABS
650.0000 mg | ORAL_TABLET | Freq: Once | ORAL | Status: AC
Start: 2024-01-26 — End: 2024-01-26
  Administered 2024-01-26: 650 mg via ORAL
  Filled 2024-01-26: qty 2

## 2024-01-26 MED ORDER — CETIRIZINE HCL 10 MG/ML IV SOLN
5.0000 mg | Freq: Once | INTRAVENOUS | Status: AC
Start: 2024-01-26 — End: 2024-01-26
  Administered 2024-01-26: 5 mg via INTRAVENOUS
  Filled 2024-01-26: qty 1

## 2024-01-26 MED ORDER — SODIUM CHLORIDE 0.9 % IV SOLN: INTRAVENOUS | Status: DC

## 2024-01-26 NOTE — Progress Notes (Signed)
 Patient presents today for iron  infusion.  Patient is in satisfactory condition with no new complaints voiced.  Vital signs are stable.  We will proceed with infusion per provider orders.  Patient tolerated infusion well with no complaints voiced.  Patient left via wheelchair in stable condition.  Vital signs stable at discharge.  Follow up as scheduled.

## 2024-01-26 NOTE — Patient Instructions (Signed)
 [facility] [place] - A DEPT OF [facility]  Discharge Instructions: Thank you for choosing [facility] to provide your oncology and hematology care.  If you have a lab appointment with the Cancer Center - please note that after April 8th, 2024, all labs will be drawn in the cancer center.  You do not have to check in or register with the main entrance as you have in the past but will complete your check-in in the cancer center.  Wear comfortable clothing and clothing appropriate for easy access to any Portacath or PICC line.   We strive to give you quality time with your provider. You may need to reschedule your appointment if you arrive late (15 or more minutes).  Arriving late affects you and other patients whose appointments are after yours.  Also, if you miss three or more appointments without notifying the office, you may be dismissed from the clinic at the provider's discretion.      For prescription refill requests, have your pharmacy contact our office and allow 72 hours for refills to be completed.    Today you received the following:  Monoferric.  Ferric Derisomaltose Injection What is this medication? FERRIC DERISOMALTOSE (FER ik der EYE soe MAWL tose) treats low levels of iron in your body (iron deficiency anemia). Iron is a mineral that plays an important role in making red blood cells, which carry oxygen from your lungs to the rest of your body. This medicine may be used for other purposes; ask your health care provider or pharmacist if you have questions. COMMON BRAND NAME(S): MONOFERRIC What should I tell my care team before I take this medication? They need to know if you have any of these conditions: High levels of iron in the blood An unusual or allergic reaction to iron, other medications, foods, dyes, or preservatives Pregnant or trying to get pregnant Breastfeeding How should I use this medication? This medication is injected into  a vein. It is given by your care team in a hospital or clinic setting. Talk to your care team about the use of this medication in children. Special care may be needed. Overdosage: If you think you have taken too much of this medicine contact a poison control center or emergency room at once. NOTE: This medicine is only for you. Do not share this medicine with others. What if I miss a dose? It is important not to miss your dose. Call your care team if you are unable to keep an appointment. What may interact with this medication? Do not take this medication with any of the following: Deferoxamine Dimercaprol Other iron products This list may not describe all possible interactions. Give your health care provider a list of all the medicines, herbs, non-prescription drugs, or dietary supplements you use. Also tell them if you smoke, drink alcohol, or use illegal drugs. Some items may interact with your medicine. What should I watch for while using this medication? Visit your care team for regular checks on your progress. Tell your care team if your symptoms do not start to get better or if they get worse. You may need blood work done while you are taking this medication. You may need to eat more foods that contain iron. Talk to your care team. Foods that contain iron include whole grains or cereals, dried fruits, beans, peas, leafy green vegetables, and organ meats (liver, kidney). What side effects may I notice from receiving this medication? Side  effects that you should report to your care team as soon as possible: Allergic reactions--skin rash, itching, hives, swelling of the face, lips, tongue, or throat Low blood pressure--dizziness, feeling faint or lightheaded, blurry vision Shortness of breath Side effects that usually do not require medical attention (report to your care team if they continue or are bothersome): Flushing Headache Joint pain Muscle pain Nausea Pain, redness, or  irritation at injection site This list may not describe all possible side effects. Call your doctor for medical advice about side effects. You may report side effects to FDA at 1-800-FDA-1088. Where should I keep my medication? This medication is given in a hospital or clinic. It will not be stored at home. NOTE: This sheet is a summary. It may not cover all possible information. If you have questions about this medicine, talk to your doctor, pharmacist, or health care provider.  2024 Elsevier/Gold Standard (2023-02-11 00:00:00)       To help prevent nausea and vomiting after your treatment, we encourage you to take your nausea medication as directed.  BELOW ARE SYMPTOMS THAT SHOULD BE REPORTED IMMEDIATELY: *FEVER GREATER THAN 100.4 F (38 C) OR HIGHER *CHILLS OR SWEATING *NAUSEA AND VOMITING THAT IS NOT CONTROLLED WITH YOUR NAUSEA MEDICATION *UNUSUAL SHORTNESS OF BREATH *UNUSUAL BRUISING OR BLEEDING *URINARY PROBLEMS (pain or burning when urinating, or frequent urination) *BOWEL PROBLEMS (unusual diarrhea, constipation, pain near the anus) TENDERNESS IN MOUTH AND THROAT WITH OR WITHOUT PRESENCE OF ULCERS (sore throat, sores in mouth, or a toothache) UNUSUAL RASH, SWELLING OR PAIN  UNUSUAL VAGINAL DISCHARGE OR ITCHING   Items with * indicate a potential emergency and should be followed up as soon as possible or go to the Emergency Department if any problems should occur.  Please show the CHEMOTHERAPY ALERT CARD or IMMUNOTHERAPY ALERT CARD at check-in to the Emergency Department and triage nurse.  Should you have questions after your visit or need to cancel or reschedule your appointment, please contact [facility] CANCER CTR [place] - A DEPT OF Eligh[facility]ideg[facility] [phone]  and follow the prompts.  Office hours are 8:00 a.m. to 4:30 p.m. Monday - Friday. Please note that voicemails left after 4:00 p.m. may not be returned until the following business day.  We are closed weekends  and major holidays. You have access to a nurse at all times for urgent questions. Please call the main number to the clinic [phone] and follow the prompts.  For any non-urgent questions, you may also contact your provider using MyChart. We now offer e-Visits for anyone 70 and older to request care online for non-urgent symptoms. For details visit [email or web address].   Also download the MyChart app! Go to the app store, search "MyChart", open the app, select [place], and log in with your MyChart username and password.

## 2024-01-28 ENCOUNTER — Inpatient Hospital Stay

## 2024-01-29 DIAGNOSIS — I110 Hypertensive heart disease with heart failure: Secondary | ICD-10-CM | POA: Diagnosis not present

## 2024-01-29 DIAGNOSIS — M349 Systemic sclerosis, unspecified: Secondary | ICD-10-CM | POA: Diagnosis not present

## 2024-01-29 DIAGNOSIS — K922 Gastrointestinal hemorrhage, unspecified: Secondary | ICD-10-CM | POA: Diagnosis not present

## 2024-01-29 DIAGNOSIS — I7300 Raynaud's syndrome without gangrene: Secondary | ICD-10-CM | POA: Diagnosis not present

## 2024-01-29 DIAGNOSIS — I442 Atrioventricular block, complete: Secondary | ICD-10-CM | POA: Diagnosis not present

## 2024-01-29 DIAGNOSIS — I5032 Chronic diastolic (congestive) heart failure: Secondary | ICD-10-CM | POA: Diagnosis not present

## 2024-02-01 NOTE — Progress Notes (Unsigned)
 VIRTUAL VISIT via TELEPHONE NOTE [facility]   I connected with Haley Trevino  on 02/02/24 at  3:30 PM by telephone and verified that I am speaking with the correct person using two identifiers.  Location: Patient: Home Provider: [facility]   I discussed the limitations, risks, security and privacy concerns of performing an evaluation and management service by telephone and the availability of in person appointments. I also discussed with the patient that there may be a patient responsible charge related to this service. The patient expressed understanding and agreed to proceed.  REASON FOR VISIT:  Follow-up for iron  deficiency anemia   CURRENT THERAPY: IV iron  infusions with intermittent PRBC transfusions  INTERVAL HISTORY:  Haley Trevino (83 y.o. female) returns for routine follow-up of iron  deficiency anemia.   She was last evaluated via telemedicine visit by Pleasant Barefoot PA-C on 12/02/2023. She has not had any interim hospitalizations in the past 2 months.  At today's visit, she reports feeling somewhat poorly due to fatigue and other comorbidities. No recurrent rectal bleeding since hospitalization in May 2025. She did have some dark-colored diarrhea this past weekend. She has ongoing diffuse musculoskeletal pain from her fibromyalgia and other chronic comorbidities. She also reports vertigo, tremors, general malaise, and headaches. She has not had any recurrent syncope, but has some lightheadedness and feeling off-balance. She has chronic dyspnea on exertion. She has ongoing musculoskeletal chest discomfort related to scar tissue (s/p implant removal) and scleroderma, but denies any exertional chest pain. She remains off of Eliquis  to date, but is taking 81 mg aspirin . She reports little to no energy and 75% appetite.   ASSESSMENT & PLAN:  1.    Iron  deficiency anemia - Iron  deficiency anemia secondary to GI bleedings, has  required blood transfusions - EGD (10/25/2020): 2 angiodysplastic lesions without bleeding in the duodenum, another angiodysplastic lesion in duodenum; treated by APC - Capsule study (11/29/2020): At least 3 proximal and mid small bowel AVMs, nonbleeding, but also with some possible red material in the proximal colon - Other work-up: SPEP/IFE/light chains unremarkable; LDH normal; CMP unremarkable  - She had TAVR on 05/20/2022, and was hospitalized from 05/13/2022 through 05/29/2022 (PRBC transfusion x 1 on 05/13/2022); hemoglobin was stable at discharge around 9.8. - Was placed on Eliquis  for new onset A-fib/flutter in November 2023, but developed profuse melanotic stool within 48 hours of starting Eliquis .  Patient self discontinued her Eliquis  and within 24 hours of stopping Eliquis  her bowel movements had returned to normal. - Severe anemia with Hgb 6.6 on 06/09/2022, therefore transfused PRBC x 2 units. - She continues to follow with GI (Dr. Rosalie via [facility]) - Most recent IV iron  with Feraheme x 3 in February 2025 - Hospitalized in May 2025 for hematochezia (suspected diverticular bleed), but did not require blood transfusion or endoscopy/colonoscopy. - No recurrent hematochezia or melena since hospitalization in May 2025.   - Labs (01/14/2024): Hgb 11.0/MCV 99.7, ferritin 56, iron  saturation 15%.  Previous labs have shown normal creatinine/GFR. - CBC today (02/02/2024): Hgb 11.4/MCV 98.0. - Differential diagnosis favors iron  deficiency anemia secondary to chronic GI blood loss; may also be an aspect of malabsorption in the setting of scleroderma, low meat intake, and use of PPI - PLAN: Recommend IV Monoferric  x 1 (PREMED w/ IV cetirizine ) - Monthly CBC/D with BB sample - Labs (CBC/D, BB sample, ferritin, iron /TIBC) and RTC in 3 months with PHONE visit  - Recommend continued outpatient  GI follow-up, seeing Dr. Rosalie next week - Referral for port due to poor peripheral IV access and  anticipated ongoing need for frequent labs, IV infusions, and possible blood transfusions  PLAN SUMMARY: (last appointment of the day) >> Referral to IR for port placement >> IV iron  with Monoferric  x1 (after port placement) >> Monthly CBC/BB sample via PORT >> Labs in 3 months = CBC/D, ferritin, iron /TIBC, BB sample >> PHONE visit in 3 months (after labs)  ** Last office visit 09/28/23      REVIEW OF SYSTEMS:   Review of Systems  Constitutional:  Positive for malaise/fatigue. Negative for chills, diaphoresis, fever and weight loss.  Respiratory:  Positive for cough and shortness of breath (with exertion).   Cardiovascular:  Positive for chest pain (scar tissue) and palpitations.  Gastrointestinal:  Positive for nausea. Negative for abdominal pain, blood in stool, melena and vomiting.  Musculoskeletal:  Positive for joint pain.  Neurological:  Positive for dizziness and headaches.  Psychiatric/Behavioral:  Positive for depression. The patient is nervous/anxious.      PHYSICAL EXAM: (per limitations of virtual telephone visit)  The patient is alert and oriented x 3, exhibiting adequate mentation, good mood, and ability to speak in full sentences and execute sound judgement.  WRAP UP:   I discussed the assessment and treatment plan with the patient. The patient was provided an opportunity to ask questions and all were answered. The patient agreed with the plan and demonstrated an understanding of the instructions.   The patient was advised to call back or seek an in-person evaluation if the symptoms worsen or if the condition fails to improve as anticipated.  I provided 25 minutes of non-face-to-face time during this encounter, including >10 minutes of medical discussion.  Pleasant CHRISTELLA Barefoot, PA-C 02/02/24 3:58 PM

## 2024-02-02 ENCOUNTER — Ambulatory Visit (HOSPITAL_COMMUNITY)

## 2024-02-02 ENCOUNTER — Inpatient Hospital Stay: Admitting: Physician Assistant

## 2024-02-02 ENCOUNTER — Inpatient Hospital Stay

## 2024-02-02 DIAGNOSIS — D500 Iron deficiency anemia secondary to blood loss (chronic): Secondary | ICD-10-CM

## 2024-02-02 DIAGNOSIS — Z789 Other specified health status: Secondary | ICD-10-CM

## 2024-02-02 DIAGNOSIS — D509 Iron deficiency anemia, unspecified: Secondary | ICD-10-CM | POA: Diagnosis not present

## 2024-02-02 LAB — CBC
HCT: 34.7 % — ABNORMAL LOW (ref 36.0–46.0)
Hemoglobin: 11.4 g/dL — ABNORMAL LOW (ref 12.0–15.0)
MCH: 32.2 pg (ref 26.0–34.0)
MCHC: 32.9 g/dL (ref 30.0–36.0)
MCV: 98 fL (ref 80.0–100.0)
Platelets: 230 K/uL (ref 150–400)
RBC: 3.54 MIL/uL — ABNORMAL LOW (ref 3.87–5.11)
RDW: 17.1 % — ABNORMAL HIGH (ref 11.5–15.5)
WBC: 5.3 K/uL (ref 4.0–10.5)
nRBC: 0 % (ref 0.0–0.2)

## 2024-02-02 LAB — SAMPLE TO BLOOD BANK

## 2024-02-03 ENCOUNTER — Telehealth (HOSPITAL_COMMUNITY): Payer: Self-pay

## 2024-02-03 NOTE — Telephone Encounter (Signed)
Called to schedule port placement, no answer, left vm. AB

## 2024-02-08 ENCOUNTER — Ambulatory Visit: Payer: Medicare Other | Admitting: Nurse Practitioner

## 2024-02-08 ENCOUNTER — Encounter: Payer: Self-pay | Admitting: Nurse Practitioner

## 2024-02-08 DIAGNOSIS — F419 Anxiety disorder, unspecified: Secondary | ICD-10-CM

## 2024-02-08 DIAGNOSIS — E785 Hyperlipidemia, unspecified: Secondary | ICD-10-CM | POA: Diagnosis not present

## 2024-02-08 DIAGNOSIS — F3342 Major depressive disorder, recurrent, in full remission: Secondary | ICD-10-CM | POA: Diagnosis not present

## 2024-02-08 DIAGNOSIS — K219 Gastro-esophageal reflux disease without esophagitis: Secondary | ICD-10-CM

## 2024-02-08 DIAGNOSIS — I7300 Raynaud's syndrome without gangrene: Secondary | ICD-10-CM

## 2024-02-08 DIAGNOSIS — I480 Paroxysmal atrial fibrillation: Secondary | ICD-10-CM | POA: Insufficient documentation

## 2024-02-08 DIAGNOSIS — I442 Atrioventricular block, complete: Secondary | ICD-10-CM | POA: Diagnosis not present

## 2024-02-08 DIAGNOSIS — E039 Hypothyroidism, unspecified: Secondary | ICD-10-CM | POA: Diagnosis not present

## 2024-02-08 DIAGNOSIS — I5033 Acute on chronic diastolic (congestive) heart failure: Secondary | ICD-10-CM | POA: Diagnosis not present

## 2024-02-08 DIAGNOSIS — I272 Pulmonary hypertension, unspecified: Secondary | ICD-10-CM

## 2024-02-08 MED ORDER — FLUOXETINE HCL 40 MG PO CAPS
40.0000 mg | ORAL_CAPSULE | Freq: Every day | ORAL | 1 refills | Status: AC
Start: 2024-02-08 — End: ?

## 2024-02-08 MED ORDER — EZETIMIBE 10 MG PO TABS: 10.0000 mg | ORAL_TABLET | Freq: Every day | ORAL | 1 refills | Status: AC

## 2024-02-08 MED ORDER — CLONAZEPAM 0.5 MG PO TABS: 0.5000 mg | ORAL_TABLET | Freq: Two times a day (BID) | ORAL | 1 refills | Status: AC | PRN

## 2024-02-08 MED ORDER — OMEPRAZOLE 20 MG PO CPDR: DELAYED_RELEASE_CAPSULE | ORAL | 1 refills | Status: AC

## 2024-02-08 MED ORDER — SILDENAFIL CITRATE 20 MG PO TABS
20.0000 mg | ORAL_TABLET | Freq: Two times a day (BID) | ORAL | 1 refills | Status: AC
Start: 2024-02-08 — End: ?

## 2024-02-08 MED ORDER — NIFEDIPINE ER OSMOTIC RELEASE 60 MG PO TB24: 60.0000 mg | ORAL_TABLET | Freq: Every day | ORAL | 1 refills | Status: AC

## 2024-02-08 MED ORDER — SYNTHROID 75 MCG PO TABS: ORAL_TABLET | ORAL | 1 refills | Status: AC

## 2024-02-08 NOTE — Patient Instructions (Signed)
 Fall Prevention in the Home, Adult Falls can cause injuries and can happen to people of all ages. There are many things you can do to make your home safer and to help prevent falls. What actions can I take to prevent falls? General information Use good lighting in all rooms. Make sure to: Replace any light bulbs that burn out. Turn on the lights in dark areas and use night-lights. Keep items that you use often in easy-to-reach places. Lower the shelves around your home if needed. Move furniture so that there are clear paths around it. Do not use throw rugs or other things on the floor that can make you trip. If any of your floors are uneven, fix them. Add color or contrast paint or tape to clearly mark and help you see: Grab bars or handrails. First and last steps of staircases. Where the edge of each step is. If you use a ladder or stepladder: Make sure that it is fully opened. Do not climb a closed ladder. Make sure the sides of the ladder are locked in place. Have someone hold the ladder while you use it. Know where your pets are as you move through your home. What can I do in the bathroom?     Keep the floor dry. Clean up any water on the floor right away. Remove soap buildup in the bathtub or shower. Buildup makes bathtubs and showers slippery. Use non-skid mats or decals on the floor of the bathtub or shower. Attach bath mats securely with double-sided, non-slip rug tape. If you need to sit down in the shower, use a non-slip stool. Install grab bars by the toilet and in the bathtub and shower. Do not use towel bars as grab bars. What can I do in the bedroom? Make sure that you have a light by your bed that is easy to reach. Do not use any sheets or blankets on your bed that hang to the floor. Have a firm chair or bench with side arms that you can use for support when you get dressed. What can I do in the kitchen? Clean up any spills right away. If you need to reach something  above you, use a step stool with a grab bar. Keep electrical cords out of the way. Do not use floor polish or wax that makes floors slippery. What can I do with my stairs? Do not leave anything on the stairs. Make sure that you have a light switch at the top and the bottom of the stairs. Make sure that there are handrails on both sides of the stairs. Fix handrails that are broken or loose. Install non-slip stair treads on all your stairs if they do not have carpet. Avoid having throw rugs at the top or bottom of the stairs. Choose a carpet that does not hide the edge of the steps on the stairs. Make sure that the carpet is firmly attached to the stairs. Fix carpet that is loose or worn. What can I do on the outside of my home? Use bright outdoor lighting. Fix the edges of walkways and driveways and fix any cracks. Clear paths of anything that can make you trip, such as tools or rocks. Add color or contrast paint or tape to clearly mark and help you see anything that might make you trip as you walk through a door, such as a raised step or threshold. Trim any bushes or trees on paths to your home. Check to see if handrails are loose  or broken and that both sides of all steps have handrails. Install guardrails along the edges of any raised decks and porches. Have leaves, snow, or ice cleared regularly. Use sand, salt, or ice melter on paths if you live where there is ice and snow during the winter. Clean up any spills in your garage right away. This includes grease or oil spills. What other actions can I take? Review your medicines with your doctor. Some medicines can cause dizziness or changes in blood pressure, which increase your risk of falling. Wear shoes that: Have a low heel. Do not wear high heels. Have rubber bottoms and are closed at the toe. Feel good on your feet and fit well. Use tools that help you move around if needed. These include: Canes. Walkers. Scooters. Crutches. Ask  your doctor what else you can do to help prevent falls. This may include seeing a physical therapist to learn to do exercises to move better and get stronger. Where to find more information Centers for Disease Control and Prevention, STEADI: TonerPromos.no [employer] on Aging: BaseRingTones.pl National Institute on Aging: BaseRingTones.pl Contact a doctor if: You are afraid of falling at home. You feel weak, drowsy, or dizzy at home. You fall at home. Get help right away if you: Lose consciousness or have trouble moving after a fall. Have a fall that causes a head injury. These symptoms may be an emergency. Get help right away. Call 911. Do not wait to see if the symptoms will go away. Do not drive yourself to the hospital. This information is not intended to replace advice given to you by your health care provider. Make sure you discuss any questions you have with your health care provider. Document Revised: 02/24/2022 Document Reviewed: 02/24/2022 Elsevier Patient Education  2024 [employer].

## 2024-02-08 NOTE — Progress Notes (Unsigned)
  Cardiology Office Note:   Date:  02/10/2024  ID:  CAMMI CONSALVO, DOB 1940/11/20, MRN [id number] PCP: Gladis Mustard, FNP  [facility] Providers Cardiologist:  Lynwood Schilling, MD Cardiology APP:  Shermon Shona MATSU, PA-C  Electrophysiologist:  OLE ONEIDA HOLTS, MD {  History of Present Illness:   Haley Trevino is a 83 y.o. female who is followed for TAVR done in November 2023.  She has had a history of respiratory failure requiring IV diuresis.  She has had complete heart block with permanent pacemaker placement.  She been followed by the Advanced [facility] Failure clinic in the past.  She has had scleroderma contributing to pre and postcapillary elevated pulmonary pressures.  She has had GI bleeding related to small bowel AVMs.  She has had atrial fibrillation and flutter.    She is going to get a Port-A-Cath placement.  She gets regular iron  infusions.  She is not having any acute cardiac issues.  She denies any new chest pressure, neck or arm discomfort.  She has no new palpitations, presyncope or syncope.  She has no weight gain or edema.  She does get dyspneic when she does things such as make the bed or climb up stairs but she says this has been chronic.   ROS: As stated in the HPI and negative for all other systems.  Studies Reviewed:    EKG:     NA  Risk Assessment/Calculations:    CHA2DS2-VASc Score = 3   This indicates a 3.2% annual risk of stroke. The patient's score is based upon:  Physical Exam:   VS:  BP 128/60   Pulse 60   Ht 5' 2 (1.575 m)   Wt 99 lb (44.9 kg)   BMI 18.11 kg/m    Wt Readings from Last 3 Encounters:  02/10/24 99 lb (44.9 kg)  02/08/24 100 lb (45.4 kg)  01/26/24 100 lb 1.6 oz (45.4 kg)     GEN: Well nourished, well developed in no acute distress NECK: No JVD; No carotid bruits CARDIAC: Regular RR, 2 out of 6 systolic murmur radiating slightly at the aortic outflow tract, no diastolic murmurs, rubs, gallops RESPIRATORY:   Clear to auscultation without rales, wheezing or rhonchi  ABDOMEN: Soft, non-tender, non-distended EXTREMITIES:  No edema; No deformity   ASSESSMENT AND PLAN:   History of aortic stenosis: Status post TAVR on 05/13/2022.  She had normal valve function in 2024 and is to get a follow-up in November of this year.  She understands endocarditis prophylaxis.   HFpEF:   She seems to be euvolemic.  No change in therapy.   Pacemaker in situ:   She is up-to-date with follow-up and I reviewed the last tracing.   Iron  deficiency anemia: She has iron  infusions and is followed routinely.   Atrial flutter:    She has a very low burden of this on the device interrogation in June.  No DOAC with bleeding history.     MS: This is moderate and I will follow this clinically and with repeat echo     Follow up with me in 1 year  Signed, Lynwood Schilling, MD

## 2024-02-08 NOTE — Progress Notes (Signed)
 Subjective:    Patient ID: Haley Trevino, female    DOB: 07/10/1940, 83 y.o.   MRN: [id number]   Chief Complaint: medical management of chronic issues     HPI:  Haley Trevino is a 83 y.o. who identifies as a female who was assigned female at birth.   Social history: Lives with: husband Work history: retired   Water engineer in today for follow up of the following chronic medical issues:  1. Primary hypertension No c/o chest pain or headache. Has SOB all the time. BP Readings from Last 3 Encounters:  01/26/24 (!) 126/47  01/19/24 (!) 115/48  12/11/23 (!) 138/53     2. Acute on chronic diastolic heart failure (HCC) 3. Heart block AV complete (HCC) 4. Pulmonary HTN (HCC) Last saw cardiology on 08/13/23. Has pacemaker checked monthly. According to office note no changes were made  to plan of care.  5. [name] without gangrene Her raynauds is worsening but always does better in the summer time.  6. Gastroesophageal reflux disease without esophagitis Is on omperazole and is doing well.  7. Irritable bowel syndrome with both constipation and diarrhea No recent flare ups  8. Acquired hypothyroidism No issues that she is aware of. Lab Results  Component Value Date   TSH 1.060 08/10/2023     9. Hyperlipidemia with target LDL less than 100 Has a very poor appetite and has actually been losing weight. Lab Results  Component Value Date   CHOL 186 08/10/2023   HDL 52 08/10/2023   LDLCALC 102 (H) 08/10/2023   TRIG 183 (H) 08/10/2023   CHOLHDL 3.6 08/10/2023     10. Hyperkalemia Denies any muscle cramps Lab Results  Component Value Date   K 3.6 11/25/2023     11. Iron  deficiency anemia due to chronic blood loss  has been seeing hematology for anemia and is getting iron  infusion. Saw hematologist on 02/02/24. No change to plan of care. Was encouraged to continue to follow up with GI due to her past GI bleeds. She is getting ready to have a port put in  for infusions because they cannot get a vein anymore Lab Results  Component Value Date   HGB 11.4 (L) 02/02/2024     12. Anxiety Is on klonopin  and is doing okay    02/08/2024    2:08 PM 11/26/2023   11:10 AM 07/20/2023   10:08 AM 05/29/2023   11:38 AM  GAD 7 : Generalized Anxiety Score  Nervous, Anxious, on Edge 0 0 0 0  Control/stop worrying 0 0 0 0  Worry too much - different things 0 0 0 0  Trouble relaxing 0 0 0 0  Restless 0 0 0 0  Easily annoyed or irritable 0 0 0 0  Afraid - awful might happen 0 0 0 0  Total GAD 7 Score 0 0 0 0  Anxiety Difficulty Not difficult at all Not difficult at all  Not difficult at all        13. Recurrent major depressive disorder, in full remission (HCC) Has been on prozac  for several years. Helps some.     02/08/2024    2:07 PM 02/02/2024    2:57 PM 01/26/2024    1:24 PM  Depression screen PHQ 2/9  Decreased Interest 0 0 0  Down, Depressed, Hopeless 0 0 0  PHQ - 2 Score 0 0 0  Altered sleeping 0    Tired, decreased energy 0    Change in  appetite 0    Feeling bad or failure about yourself  0    Trouble concentrating 0    Moving slowly or fidgety/restless 0    Suicidal thoughts 0    PHQ-9 Score 0    Difficult doing work/chores Not difficult at all        14. SCLERODERMA Use to see DR. Beckman but he was not doing anything so she quit going.   New complaints: None today  Lab Results  Component Value Date   HGB 11.4 (L) 02/02/2024     Allergies  Allergen Reactions   Bactrim  [Sulfamethoxazole -Trimethoprim ] Nausea And Vomiting   Dilaudid [Hydromorphone Hcl] Anaphylaxis   Eliquis  [Apixaban ] Other (See Comments)    Caused bleed   Acyclovir And Related Nausea And Vomiting   Crestor  [Rosuvastatin  Calcium ] Other (See Comments)    Muscle pain   Gabapentin Nausea And Vomiting and Swelling    Swelling in feet and in legs   Aleve [Naproxen Sodium] Rash   Lyrica [Pregabalin] Other (See Comments)    Swelling in feet, and in  legs   Outpatient Encounter Medications as of 02/08/2024  Medication Sig   acetaminophen  (TYLENOL ) 500 MG tablet Take 1,000 mg by mouth at bedtime.   aspirin  EC 81 MG tablet Take 81 mg by mouth daily. Swallow whole.   clonazePAM  (KLONOPIN ) 0.5 MG tablet Take 1 tablet (0.5 mg total) by mouth 2 (two) times daily as needed for anxiety.   DULoxetine  (CYMBALTA ) 20 MG capsule Take 1 capsule (20 mg total) by mouth daily.   ezetimibe  (ZETIA ) 10 MG tablet Take 1 tablet (10 mg total) by mouth daily.   FLUoxetine  (PROZAC ) 40 MG capsule Take 1 capsule (40 mg total) by mouth daily.   fluticasone  (FLONASE ) 50 MCG/ACT nasal spray PLACE 2 SPRAYS INTO BOTH NOSTRILS DAILY (Patient taking differently: Place 2 sprays into both nostrils as needed for allergies.)   furosemide  (LASIX ) 20 MG tablet Take 1 tablet (20 mg total) by mouth daily as needed. (Patient taking differently: Take 20 mg by mouth daily as needed for fluid.)   NIFEdipine  (PROCARDIA  XL/NIFEDICAL XL) 60 MG 24 hr tablet Take 1 tablet (60 mg total) by mouth daily.   omeprazole  (PRILOSEC) 20 MG capsule TAKE 1 TABLET DAILY (Patient taking differently: Take 20 mg by mouth daily. TAKE 1 TABLET DAILY)   RESTASIS  0.05 % ophthalmic emulsion Place 1 drop into both eyes 2 (two) times daily.   sildenafil  (REVATIO ) 20 MG tablet Take 1 tablet (20 mg total) by mouth 2 (two) times daily.   SYNTHROID  75 MCG tablet TAKE (1) TABLET DAILY BE- FORE BREAKFAST. (Patient taking differently: Take 75 mcg by mouth every morning. TAKE (1) TABLET DAILY BE- FORE BREAKFAST.)   topiramate  (TOPAMAX ) 50 MG tablet Take 1 tablet (50 mg total) by mouth at bedtime.   No facility-administered encounter medications on file as of 02/08/2024.    Past Surgical History:  Procedure Laterality Date   BREAST ENHANCEMENT SURGERY  1975   BREAST IMPLANT REMOVAL Bilateral 04/23/2016   Procedure: REMOVALBILATERAL BREAST IMPLANTS;  Surgeon: Estefana GORMAN Fritter, DO;  Location: [facility];   Service: Plastics;  Laterality: Bilateral;   CHOLECYSTECTOMY  1973   ESOPHAGOGASTRODUODENOSCOPY N/A 10/25/2020   Procedure: ESOPHAGOGASTRODUODENOSCOPY (EGD);  Surgeon: Rosalie Kitchens, MD;  Location: [facility];  Service: Endoscopy;  Laterality: N/A;  enteroscopy   HAND SURGERY  08/2012; 11/2012   HOT HEMOSTASIS N/A 10/25/2020   Procedure: HOT HEMOSTASIS (ARGON PLASMA COAGULATION/BICAP);  Surgeon: Rosalie Kitchens,  MD;  Location: [facility];  Service: Endoscopy;  Laterality: N/A;   INTRAOPERATIVE TRANSTHORACIC ECHOCARDIOGRAM N/A 05/13/2022   Procedure: INTRAOPERATIVE TRANSTHORACIC ECHOCARDIOGRAM;  Surgeon: Verlin Lonni BIRCH, MD;  Location: [facility];  Service: Open Heart Surgery;  Laterality: N/A;   INTRAOPERATIVE TRANSTHORACIC ECHOCARDIOGRAM N/A 05/20/2022   Procedure: INTRAOPERATIVE TRANSTHORACIC ECHOCARDIOGRAM;  Surgeon: Wendel Lurena POUR, MD;  Location[facility];  Service: Open Heart Surgery;  Laterality: N/A;   IR RADIOLOGIST EVAL & MGMT  07/27/2017   IR SACROPLASTY BILATERAL  07/31/2017   IR VERTEBROPLASTY CERV/THOR BX INC UNI/BIL INC/INJECT/IMAGING  03/13/2020   IR VERTEBROPLASTY EA ADDL (T&LS) BX INC UNI/BIL INC INJECT/IMAGING  03/14/2020   MELANOMA EXCISION     at 31 yrs of age   ORIF HIP FRACTURE Right 02/22/2014   Procedure: OPEN REDUCTION INTERNAL FIXATION RIGHT HIP;  Surgeon: Lemond Stable, MD;  Location: [facility];  Service: Orthopedics;  Laterality: Right;   PACEMAKER IMPLANT N/A 05/27/2022   Procedure: PACEMAKER IMPLANT;  Surgeon: Cindie Ole DASEN, MD;  Location: [facility];  Service: Cardiovascular;  Laterality: N/A;   RIGHT HEART CATH N/A 01/16/2023   Procedure: RIGHT HEART CATH;  Surgeon: Gardenia Led, DO;  Location: [facility];  Service: Cardiovascular;  Laterality: N/A;   RIGHT HEART CATH AND CORONARY ANGIOGRAPHY N/A 03/28/2022   Procedure: RIGHT HEART CATH AND CORONARY ANGIOGRAPHY;  Surgeon: Wendel Lurena POUR, MD;  Location: [facility];  Service:  Cardiovascular;  Laterality: N/A;   TOTAL ABDOMINAL HYSTERECTOMY  1974   TRANSCATHETER AORTIC VALVE REPLACEMENT, TRANSFEMORAL Left 05/13/2022   Procedure: Transcatheter Aortic Valve Replacement, Transfemoral;  Surgeon: Verlin Lonni BIRCH, MD;  Location: [facility];  Service: Open Heart Surgery;  Laterality: Left;   TRANSCATHETER AORTIC VALVE REPLACEMENT, TRANSFEMORAL Left 05/20/2022   Procedure: Transcatheter Aortic Valve Replacement, Transfemoral using an Edwards SAPIEN 3 Ultra 23 MM Heart Valve;  Surgeon: Wendel Lurena POUR, MD;  Location: [facility];  Service: Open Heart Surgery;  Laterality: Left;  Left Transfemoral    Family History  Problem Relation Age of Onset   Pancreatic cancer Father    Raynaud syndrome Father    Cancer Father        pancreatic   Rashes / Skin problems Daughter        possibly scleraderma   Hip fracture Mother    Mental illness Mother        attempted suicide at 31 yo      Controlled substance contract: n/a     Review of Systems  Constitutional:  Negative for diaphoresis.  Eyes:  Negative for pain.  Respiratory:  Negative for shortness of breath.   Cardiovascular:  Negative for chest pain, palpitations and leg swelling.  Gastrointestinal:  Negative for abdominal pain.  Endocrine: Negative for polydipsia.  Skin:  Negative for rash.  Neurological:  Negative for dizziness, weakness and headaches.  Hematological:  Does not bruise/bleed easily.  All other systems reviewed and are negative.      Objective:   Physical Exam Vitals and nursing note reviewed.  Constitutional:      General: She is not in acute distress.    Appearance: Normal appearance. She is well-developed.  HENT:     Head: Normocephalic.     Right Ear: Tympanic membrane normal.     Left Ear: Tympanic membrane normal.     Nose: Nose normal.     Mouth/Throat:     Mouth: Mucous membranes are moist.  Eyes:  Pupils: Pupils are equal, round, and reactive to light.  Neck:      Vascular: No carotid bruit or JVD.  Cardiovascular:     Rate and Rhythm: Normal rate and regular rhythm.     Heart sounds: Murmur (4/6 systolic) heard.  Pulmonary:     Effort: Pulmonary effort is normal. No respiratory distress.     Breath sounds: Normal breath sounds. No wheezing or rales.  Chest:     Chest wall: No tenderness.  Abdominal:     General: Bowel sounds are normal. There is no distension or abdominal bruit.     Palpations: Abdomen is soft. There is no hepatomegaly, splenomegaly, mass or pulsatile mass.     Tenderness: There is no abdominal tenderness.  Musculoskeletal:        General: Normal range of motion.     Cervical back: Normal range of motion and neck supple.  Lymphadenopathy:     Cervical: No cervical adenopathy.  Skin:    General: Skin is warm and dry.     Comments: Paleness and cool to touch finger tips bil.  Neurological:     Mental Status: She is alert and oriented to person, place, and time.     Deep Tendon Reflexes: Reflexes are normal and symmetric.  Psychiatric:        Behavior: Behavior normal.        Thought Content: Thought content normal.        Judgment: Judgment normal.     BP 131/81   Pulse 71   Temp 98.4 F (36.9 C) (Temporal)   Ht 5' 2 (1.575 m)   Wt 100 lb (45.4 kg)   SpO2 100%   BMI 18.29 kg/m          Assessment & Plan:   Haley Trevino comes in today with chief complaint of medical management of chronic issues    Diagnosis and orders addressed:  1. Primary hypertension Low sodium diet  2. Acute on chronic diastolic heart failure (HCC) 3. Heart block AV complete (HCC) 4. Pulmonary HTN (HCC) Keep follow up with cardiology Lasix  as needed if develop SOB-  Daily weights- if gain 2 lbs in 1 day take lasix   5. [name] without gangrene Keep follow  up with rheumatology  6. Gastroesophageal reflux disease without esophagitis Avoid spicy foods Do not eat 2 hours prior to bedtime  - omeprazole   (PRILOSEC) 20 MG capsule; TAKE 1 TABLET DAILY  Dispense: 90 capsule; Refill: 1  7. Irritable bowel syndrome with both constipation and diarrhea  8. Acquired hypothyroidism Labs pending - SYNTHROID  75 MCG tablet; TAKE (1) TABLET DAILY BE- FORE BREAKFAST.  Dispense: 90 tablet; Refill: 1  9. Hyperlipidemia with target LDL less than 100 Low fat diet - ezetimibe  (ZETIA ) 10 MG tablet; Take 1 tablet (10 mg total) by mouth daily.  Dispense: 90 tablet; Refill: 1  10. Hyperkalemia Labs pending  11. Iron  deficiency anemia due to chronic blood loss Labs pending  12. Anxiety Stress management - ToxASSURE Select 13 (MW), Urine - clonazePAM  (KLONOPIN ) 0.5 MG tablet; Take 1 tablet (0.5 mg total) by mouth 2 (two) times daily as needed for anxiety.  Dispense: 180 tablet; Refill: 1  13. Recurrent major depressive disorder, in full remission (HCC) - FLUoxetine  (PROZAC ) 40 MG capsule; Take 1 capsule (40 mg total) by mouth daily.  Dispense: 90 capsule; Refill: 1  14. SCLERODERMA Keep follow up with rheumatology Will try some flexeril  and see if helps.   Labs  pending Health Maintenance reviewed Diet and exercise encouraged  Follow up plan: 6 months   Mary-Margaret Gladis, FNP

## 2024-02-09 ENCOUNTER — Ambulatory Visit: Payer: Self-pay | Admitting: Nurse Practitioner

## 2024-02-09 ENCOUNTER — Ambulatory Visit: Admitting: Physical Medicine and Rehabilitation

## 2024-02-09 LAB — LIPID PANEL
Chol/HDL Ratio: 3.1 ratio (ref 0.0–4.4)
Cholesterol, Total: 156 mg/dL (ref 100–199)
HDL: 51 mg/dL (ref >39)
LDL Chol Calc (NIH): 75 mg/dL (ref 0–99)
Triglycerides: 176 mg/dL — ABNORMAL HIGH (ref 0–149)
VLDL Cholesterol Cal: 30 mg/dL (ref 5–40)

## 2024-02-09 LAB — CBC WITH DIFFERENTIAL/PLATELET
Basophils Absolute: 0.1 x10E3/uL (ref 0.0–0.2)
Basos: 1 %
EOS (ABSOLUTE): 0.2 x10E3/uL (ref 0.0–0.4)
Eos: 2 %
Hematocrit: 35.5 % (ref 34.0–46.6)
Hemoglobin: 11.2 g/dL (ref 11.1–15.9)
Immature Grans (Abs): 0 x10E3/uL (ref 0.0–0.1)
Immature Granulocytes: 0 %
Lymphocytes Absolute: 1.2 x10E3/uL (ref 0.7–3.1)
Lymphs: 18 %
MCH: 30.8 pg (ref 26.6–33.0)
MCHC: 31.5 g/dL (ref 31.5–35.7)
MCV: 98 fL — ABNORMAL HIGH (ref 79–97)
Monocytes Absolute: 0.4 x10E3/uL (ref 0.1–0.9)
Monocytes: 7 %
Neutrophils Absolute: 4.8 x10E3/uL (ref 1.4–7.0)
Neutrophils: 72 %
Platelets: 249 x10E3/uL (ref 150–450)
RBC: 3.64 x10E6/uL — ABNORMAL LOW (ref 3.77–5.28)
RDW: 15.3 % (ref 11.7–15.4)
WBC: 6.6 x10E3/uL (ref 3.4–10.8)

## 2024-02-09 LAB — CMP14+EGFR
ALT: 8 IU/L (ref 0–32)
AST: 17 IU/L (ref 0–40)
Albumin: 4.2 g/dL (ref 3.7–4.7)
Alkaline Phosphatase: 82 IU/L (ref 44–121)
BUN/Creatinine Ratio: 13 (ref 12–28)
BUN: 10 mg/dL (ref 8–27)
Bilirubin Total: <0.2 mg/dL (ref 0.0–1.2)
CO2: 20 mmol/L (ref 20–29)
Calcium: 9.1 mg/dL (ref 8.7–10.3)
Chloride: 103 mmol/L (ref 96–106)
Creatinine, Ser: 0.76 mg/dL (ref 0.57–1.00)
Globulin, Total: 2.4 g/dL (ref 1.5–4.5)
Glucose: 86 mg/dL (ref 70–99)
Potassium: 4.5 mmol/L (ref 3.5–5.2)
Sodium: 137 mmol/L (ref 134–144)
Total Protein: 6.6 g/dL (ref 6.0–8.5)
eGFR: 78 mL/min/1.73 (ref >59)

## 2024-02-09 LAB — THYROID PANEL WITH TSH
Free Thyroxine Index: 1.9 (ref 1.2–4.9)
T3 Uptake Ratio: 23 % — ABNORMAL LOW (ref 24–39)
T4, Total: 8.2 ug/dL (ref 4.5–12.0)
TSH: 1.98 u[IU]/mL (ref 0.450–4.500)

## 2024-02-10 ENCOUNTER — Ambulatory Visit (INDEPENDENT_AMBULATORY_CARE_PROVIDER_SITE_OTHER): Admitting: Cardiology

## 2024-02-10 ENCOUNTER — Encounter: Payer: Self-pay | Admitting: Cardiology

## 2024-02-10 VITALS — BP 128/60 | HR 60 | Ht 62.0 in | Wt 99.0 lb

## 2024-02-10 DIAGNOSIS — I480 Paroxysmal atrial fibrillation: Secondary | ICD-10-CM | POA: Diagnosis not present

## 2024-02-10 DIAGNOSIS — Z950 Presence of cardiac pacemaker: Secondary | ICD-10-CM | POA: Diagnosis not present

## 2024-02-10 DIAGNOSIS — I5032 Chronic diastolic (congestive) heart failure: Secondary | ICD-10-CM | POA: Diagnosis not present

## 2024-02-10 DIAGNOSIS — I350 Nonrheumatic aortic (valve) stenosis: Secondary | ICD-10-CM

## 2024-02-10 NOTE — Patient Instructions (Addendum)

## 2024-02-11 ENCOUNTER — Other Ambulatory Visit: Payer: Self-pay | Admitting: Student

## 2024-02-11 DIAGNOSIS — Q2733 Arteriovenous malformation of digestive system vessel: Secondary | ICD-10-CM | POA: Diagnosis not present

## 2024-02-11 DIAGNOSIS — D509 Iron deficiency anemia, unspecified: Secondary | ICD-10-CM | POA: Diagnosis not present

## 2024-02-11 DIAGNOSIS — K921 Melena: Secondary | ICD-10-CM | POA: Diagnosis not present

## 2024-02-11 NOTE — H&P (Signed)
 Chief Complaint: Patient was seen in consultation today for poor peripheral IV access with frequent need for IV iron , labs, and blood products, and with consideration for Port-A-Cath placement.  Referring Provider(s): Ms Pleasant Barefoot, [place]   Supervising Physician: Vanice Revel  Patient Status: [facility] - Out-pt  Patient is Full Code  History of Present Illness: Haley Trevino is a 83 y.o. female  with PMHx notable for HTN, HLD, scleroderma, s/p TAVR, hypothyroidism, Raynauds disease, osteoarthritis, IBS, GERD, and depression.  Per Ms. Pennington's progress note on 7/29: Iron  deficiency anemia - Iron  deficiency anemia secondary to GI bleedings, has required blood transfusions. - [...] She continues to follow with GI (Dr. Rosalie via [facility]). - [...] Differential diagnosis favors iron  deficiency anemia secondary to chronic GI blood loss; may also be an aspect of malabsorption in the setting of scleroderma, low meat intake, and use of PPI - PLAN: Recommend IV Monoferric  x 1 (PREMED w/ IV cetirizine ) - Monthly CBC/D with BB sample - Labs (CBC/D, BB sample, ferritin, iron /TIBC) and RTC in 3 months with PHONE visit  - Recommend continued outpatient GI follow-up, seeing Dr. Rosalie next week - Referral for port due to poor peripheral IV access and anticipated ongoing need for frequent labs, IV infusions, and possible blood transfusions  Interventional Radiology was requested for Port-A-Cath placement. Patient is scheduled for same in IR today.   Patient is alert and laying in bed, calm.  Patient is currently without any significant complaints.  Patient denies any fevers, headache, chest pain, SOB, cough, abdominal pain, nausea, vomiting or bleeding.     Past Medical History:  Diagnosis Date   Cataract    Depression    GERD (gastroesophageal reflux disease)    Hyperlipidemia    Hypertension    Hypothyroidism    IBS (irritable bowel syndrome)     Osteoarthritis    Raynaud disease    S/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 Wendel & Bartle   Scleroderma [facility])    Thyroid  disease    hypothyroidism    Past Surgical History:  Procedure Laterality Date   BREAST ENHANCEMENT SURGERY  1975   BREAST IMPLANT REMOVAL Bilateral 04/23/2016   Procedure: REMOVALBILATERAL BREAST IMPLANTS;  Surgeon: Estefana GORMAN Fritter, DO;  Location: [facility];  Service: Plastics;  Laterality: Bilateral;   CHOLECYSTECTOMY  1973   ESOPHAGOGASTRODUODENOSCOPY N/A 10/25/2020   Procedure: ESOPHAGOGASTRODUODENOSCOPY (EGD);  Surgeon: Rosalie Kitchens, MD;  Location: [facility];  Service: Endoscopy;  Laterality: N/A;  enteroscopy   HAND SURGERY  08/2012; 11/2012   HOT HEMOSTASIS N/A 10/25/2020   Procedure: HOT HEMOSTASIS (ARGON PLASMA COAGULATION/BICAP);  Surgeon: Rosalie Kitchens, MD;  Location: [facility];  Service: Endoscopy;  Laterality: N/A;   INTRAOPERATIVE TRANSTHORACIC ECHOCARDIOGRAM N/A 05/13/2022   Procedure: INTRAOPERATIVE TRANSTHORACIC ECHOCARDIOGRAM;  Surgeon: Verlin Lonni BIRCH, MD;  [facility];  Service: Open Heart Surgery;  Laterality: N/A;   INTRAOPERATIVE TRANSTHORACIC ECHOCARDIOGRAM N/A 05/20/2022   Procedure: INTRAOPERATIVE TRANSTHORACIC ECHOCARDIOGRAM;  Surgeon: Wendel Lurena POUR, MD;  Location: [facility] OR;  Service: Open Heart Surgery;  Laterality: N/A;   IR RADIOLOGIST EVAL & MGMT  07/27/2017   IR SACROPLASTY BILATERAL  07/31/2017   IR VERTEBROPLASTY CERV/THOR BX INC UNI/BIL INC/INJECT/IMAGING  03/13/2020   IR VERTEBROPLASTY EA ADDL (T&LS) BX INC UNI/BIL INC INJECT/IMAGING  03/14/2020   MELANOMA EXCISION     at 31 yrs  of age   ORIF HIP FRACTURE Right 02/22/2014   Procedure: OPEN REDUCTION INTERNAL FIXATION RIGHT HIP;  Surgeon: Lemond Stable, MD;  Location: [facility];  Service: Orthopedics;  Laterality: Right;   PACEMAKER IMPLANT N/A 05/27/2022    Procedure: PACEMAKER IMPLANT;  Surgeon: Cindie Ole DASEN, MD;  Location: [facility]IVE CV LAB;  Service: Cardiovascular;  Laterality: N/A;   RIGHT HEART CATH N/A 01/16/2023   Procedure: RIGHT HEART CATH;  Surgeon: Gardenia Led, DO;  [facility]: [facility];  Service: Cardiovascular;  Laterality: N/A;   RIGHT HEART CATH AND CORONARY ANGIOGRAPHY N/A 03/28/2022   Procedure: RIGHT HEART CATH AND CORONARY ANGIOGRAPHY;  Surgeon: Wendel Lurena POUR, MD;  Location: [facility];  Service: Cardiovascular;  Laterality: N/A;   TOTAL ABDOMINAL HYSTERECTOMY  1974   TRANSCATHETER AORTIC VALVE REPLACEMENT, TRANSFEMORAL Left 05/13/2022   Procedure: Transcatheter Aortic Valve Replacement, Transfemoral;  Surgeon: Verlin Lonni BIRCH, MD;  Location: [facility];  Service: Open Heart Surgery;  Laterality: Left;   TRANSCATHETER AORTIC VALVE REPLACEMENT, TRANSFEMORAL Left 05/20/2022   Procedure: Transcatheter Aortic Valve Replacement, Transfemoral using an Edwards SAPIEN 3 Ultra 23 MM Heart Valve;  Surgeon: Wendel Lurena POUR, MD;  Location: [facility];  Service: Open Heart Surgery;  Laterality: Left;  Left Transfemoral    Allergies: Bactrim  [sulfamethoxazole -trimethoprim ], Codeine, Dilaudid [hydromorphone hcl], Eliquis  [apixaban ], Acyclovir and related, Crestor  [rosuvastatin  calcium ], Gabapentin, Aleve [naproxen sodium], and Lyrica [pregabalin]  Medications: Prior to Admission medications   Medication Sig Start Date End Date Taking? Authorizing Provider  acetaminophen  (TYLENOL ) 500 MG tablet Take 1,000 mg by mouth at bedtime.    [provider]  aspirin  EC 81 MG tablet Take 81 mg by mouth daily. Swallow whole.    [provider]  clonazePAM  (KLONOPIN ) 0.5 MG tablet Take 1 tablet (0.5 mg total) by mouth 2 (two) times daily as needed for anxiety. 02/08/24   Gladis Mary-Margaret, FNP  ezetimibe  (ZETIA ) 10 MG tablet Take 1 tablet (10 mg total) by mouth daily. 02/08/24   Gladis  Mary-Margaret, FNP  FLUoxetine  (PROZAC ) 40 MG capsule Take 1 capsule (40 mg total) by mouth daily. 02/08/24   Gladis Mary-Margaret, FNP  fluticasone  (FLONASE ) 50 MCG/ACT nasal spray PLACE 2 SPRAYS INTO BOTH NOSTRILS DAILY Patient taking differently: Place 2 sprays into both nostrils as needed for allergies. 03/16/23   Gladis Mary-Margaret, FNP  NIFEdipine  (PROCARDIA  XL/NIFEDICAL XL) 60 MG 24 hr tablet Take 1 tablet (60 mg total) by mouth daily. 02/08/24   Gladis Mustard, FNP  omeprazole  (PRILOSEC) 20 MG capsule TAKE 1 TABLET DAILY 02/08/24   Gladis, Mary-Margaret, FNP  RESTASIS  0.05 % ophthalmic emulsion Place 1 drop into both eyes 2 (two) times daily. 03/26/22   [provider]  sildenafil  (REVATIO ) 20 MG tablet Take 1 tablet (20 mg total) by mouth 2 (two) times daily. 02/08/24   Gladis Mustard, FNP  SYNTHROID  75 MCG tablet TAKE (1) TABLET DAILY BE- FORE BREAKFAST. 02/08/24   Gladis Mustard, FNP     Family History  Problem Relation Age of Onset   Pancreatic cancer Father    Raynaud syndrome Father    Cancer Father        pancreatic   Rashes / Skin problems Daughter        possibly scleraderma   Hip fracture Mother    Mental illness Mother        attempted suicide at 40 yo    Social History   Socioeconomic History   Marital status:  Married    Spouse name: Not on file   Number of children: 2   Years of education: Not on file   Highest education level: Some college, no degree  Occupational History   Occupation: retired Holiday representative at [employer]  Tobacco Use   Smoking status: Never   Smokeless tobacco: Never   Tobacco comments:    passive tobacco smoke exposure as child  Vaping Use   Vaping status: Never Used  Substance and Sexual Activity   Alcohol use: No   Drug use: No   Sexual activity: Not Currently    Birth control/protection: Surgical  Other Topics Concern   Not on file  Social History Narrative   Regular exercise: walkingCaffeine use: 1 cup of  coffee daily   Lives in split level home with her husband   Social Drivers of Corporate investment banker Strain: Low Risk  (06/08/2023)   Overall Financial Resource Strain (CARDIA)    Difficulty of Paying Living Expenses: Not hard at all  Food Insecurity: No Food Insecurity (11/26/2023)   Hunger Vital Sign    Worried About Running Out of Food in the Last Year: Never true    Ran Out of Food in the Last Year: Never true  Transportation Needs: No Transportation Needs (11/26/2023)   PRAPARE - Administrator, Civil Service (Medical): No    Lack of Transportation (Non-Medical): No  Physical Activity: Inactive (06/08/2023)   Exercise Vital Sign    Days of Exercise per Week: 0 days    Minutes of Exercise per Session: 0 min  Stress: No Stress Concern Present (06/08/2023)   [employer] of Occupational Health - Occupational Stress Questionnaire    Feeling of Stress : Not at all  Social Connections: Socially Integrated (11/24/2023)   Social Connection and Isolation Panel    Frequency of Communication with Friends and Family: More than three times a week    Frequency of Social Gatherings with Friends and Family: Never    Attends Religious Services: 1 to 4 times per year    Active Member of [employer] or Organizations: No    Attends Engineer, structural: 1 to 4 times per year    Marital Status: Married     Review of Systems: A 12 point ROS discussed and pertinent positives are indicated in the HPI above.  All other systems are negative.  Vital Signs: BP (!) 170/77   Pulse 74   Temp 98 F (36.7 C) (Oral)   Resp 17   Ht 5' 2 (1.575 m)   Wt 99 lb (44.9 kg)   SpO2 100%   BMI 18.11 kg/m   Advance Care Plan: The advanced care place/surrogate decision maker was discussed at the time of visit and the patient did not wish to discuss or was not able to name a surrogate decision maker or provide an advance care plan.  Physical Exam Constitutional:      General: She is not  in acute distress.    Appearance: Normal appearance.  HENT:     Mouth/Throat:     Mouth: Mucous membranes are dry.  Cardiovascular:     Rate and Rhythm: Normal rate and regular rhythm.     Pulses: Normal pulses.     Heart sounds: Normal heart sounds.  Pulmonary:     Effort: Pulmonary effort is normal.     Breath sounds: Normal breath sounds.  Musculoskeletal:        General: Normal range of motion.  Cervical back: Normal range of motion.  Skin:    General: Skin is warm and dry.  Neurological:     Mental Status: She is alert and oriented to person, place, and time.  Psychiatric:        Mood and Affect: Mood normal.        Behavior: Behavior normal.        Thought Content: Thought content normal.        Judgment: Judgment normal.     Imaging: No results found.  Labs:  CBC: Recent Labs    12/31/23 1342 01/14/24 1432 02/02/24 1109 02/08/24 1505  WBC 5.0 6.8 5.3 6.6  HGB 10.5* 11.0* 11.4* 11.2  HCT 33.5* 30.4* 34.7* 35.5  PLT 297 227 230 249    COAGS: Recent Labs    11/23/23 1550  INR 1.1  APTT 34    BMP: Recent Labs    08/02/23 2216 08/10/23 1432 11/22/23 2150 11/24/23 0641 11/25/23 0446 02/08/24 1505  NA 137   < > 140 136 136 137  K 3.8   < > 4.6 3.8 3.6 4.5  CL 106   < > 105 106 109 103  CO2 21*   < > 21* 20* 21* 20  GLUCOSE 105*   < > 112* 103* 89 86  BUN 9   < > 21 12 10 10   CALCIUM  8.9   < > 9.3 8.5* 8.4* 9.1  CREATININE 0.87   < > 0.96 0.88 0.91 0.76  GFRNONAA >60  --  58* >60 >60  --    < > = values in this interval not displayed.    LIVER FUNCTION TESTS: Recent Labs    11/22/23 2150 11/24/23 0641 11/25/23 0446 02/08/24 1505  BILITOT <0.2 0.4 0.3 <0.2  AST 13* 62* 19 17  ALT 5 20 14 8   ALKPHOS 75 69 60 82  PROT 7.1 6.6 5.9* 6.6  ALBUMIN 4.1 3.5 3.1* 4.2    TUMOR MARKERS: No results for input(s): AFPTM, CEA, CA199, CHROMGRNA in the last 8760 hours.  Assessment and Plan: Per Ms. Pennington's progress note on  7/29: Iron  deficiency anemia - Iron  deficiency anemia secondary to GI bleedings, has required blood transfusions. - [...] Referral for port due to poor peripheral IV access and anticipated ongoing need for frequent labs, IV infusions, and possible blood transfusions  Patient presents for scheduled Port-A-Cath placement in IR today.  Patient has been NPO since midnight.  All labs and medications are within acceptable parameters.  Patient with anaphylaxis to Dilaudid. She has received fentanyl  with IR previously with no ill effects documented.  Risks and benefits of image guided port-a-catheter placement was discussed with the patient including, but not limited to bleeding, infection, pneumothorax, or fibrin sheath development and need for additional procedures.  All of the patient's questions were answered, patient is agreeable to proceed. Consent signed and in chart.    Thank you for allowing our service to participate in Haley Trevino 's care.  Electronically Signed: Carlin DELENA Griffon, PA-C   02/12/2024, 10:33 AM      I spent a total of 30 Minutes in face to face in clinical consultation, greater than 50% of which was counseling/coordinating care for poor peripheral IV access with frequent need for IV iron , labs, and blood products, and with consideration for Port-A-Cath placement.

## 2024-02-12 ENCOUNTER — Ambulatory Visit (HOSPITAL_COMMUNITY)
Admission: RE | Admit: 2024-02-12 | Discharge: 2024-02-12 | Disposition: A | Discharge status: Home / Self Care | Source: Ambulatory Visit | Attending: Physician Assistant | Admitting: Physician Assistant

## 2024-02-12 ENCOUNTER — Other Ambulatory Visit: Payer: Self-pay

## 2024-02-12 DIAGNOSIS — E611 Iron deficiency: Secondary | ICD-10-CM | POA: Insufficient documentation

## 2024-02-12 DIAGNOSIS — Z789 Other specified health status: Secondary | ICD-10-CM

## 2024-02-12 DIAGNOSIS — Z7722 Contact with and (suspected) exposure to environmental tobacco smoke (acute) (chronic): Secondary | ICD-10-CM | POA: Diagnosis not present

## 2024-02-12 HISTORY — PX: IR IMAGING GUIDED PORT INSERTION: IMG5740

## 2024-02-12 MED ORDER — LIDOCAINE-EPINEPHRINE 1 %-1:100000 IJ SOLN
INTRAMUSCULAR | Status: AC
  Filled 2024-02-12: qty 1

## 2024-02-12 MED ORDER — FENTANYL CITRATE (PF) 100 MCG/2ML IJ SOLN
INTRAMUSCULAR | Status: AC
  Filled 2024-02-12: qty 2

## 2024-02-12 MED ORDER — MIDAZOLAM HCL 2 MG/2ML IJ SOLN
INTRAMUSCULAR | Status: AC
  Filled 2024-02-12: qty 2

## 2024-02-12 MED ORDER — LIDOCAINE-EPINEPHRINE 1 %-1:100000 IJ SOLN
20.0000 mL | Freq: Once | INTRAMUSCULAR | Status: AC
  Administered 2024-02-12: 20 mL via INTRADERMAL

## 2024-02-12 MED ORDER — SODIUM CHLORIDE 0.9 % IV SOLN: INTRAVENOUS | Status: DC

## 2024-02-12 MED ORDER — HEPARIN SOD (PORK) LOCK FLUSH 100 UNIT/ML IV SOLN
500.0000 [IU] | Freq: Once | INTRAVENOUS | Status: AC
  Administered 2024-02-12: 500 [IU] via INTRAVENOUS

## 2024-02-12 MED ORDER — FENTANYL CITRATE (PF) 100 MCG/2ML IJ SOLN
INTRAMUSCULAR | Status: AC | PRN
  Administered 2024-02-12: 50 ug via INTRAVENOUS
  Administered 2024-02-12: 25 ug via INTRAVENOUS

## 2024-02-12 MED ORDER — MIDAZOLAM HCL 2 MG/2ML IJ SOLN
INTRAMUSCULAR | Status: AC | PRN
  Administered 2024-02-12: .5 mg via INTRAVENOUS
  Administered 2024-02-12: 1 mg via INTRAVENOUS

## 2024-02-12 MED ORDER — HEPARIN SOD (PORK) LOCK FLUSH 100 UNIT/ML IV SOLN
INTRAVENOUS | Status: AC
  Filled 2024-02-12: qty 5

## 2024-02-12 NOTE — Procedures (Signed)
Interventional Radiology Procedure Note  Procedure: RT internal jugular POWER PORT    Complications: None  Estimated Blood Loss:  MIN  Findings: TIP SVCRA    M. TREVOR Levaeh Vice, MD    

## 2024-02-16 ENCOUNTER — Encounter: Payer: Self-pay | Admitting: Physical Medicine and Rehabilitation

## 2024-02-16 ENCOUNTER — Encounter: Attending: Physical Medicine and Rehabilitation | Admitting: Physical Medicine and Rehabilitation

## 2024-02-16 VITALS — BP 137/76 | HR 87 | Ht 62.0 in | Wt 99.4 lb

## 2024-02-16 DIAGNOSIS — G8929 Other chronic pain: Secondary | ICD-10-CM | POA: Diagnosis not present

## 2024-02-16 DIAGNOSIS — M25512 Pain in left shoulder: Secondary | ICD-10-CM | POA: Insufficient documentation

## 2024-02-16 MED ORDER — CAPSAICIN-CLEANSING GEL 8 % EX KIT
1.0000 | PACK | Freq: Once | CUTANEOUS | Status: AC
  Administered 2024-02-16 (×2): 1 via TOPICAL

## 2024-02-16 NOTE — Progress Notes (Signed)
-  Discussed Qutenza  as an option for neuropathic pain control. Discussed that this is a capsaicin  patch, stronger than capsaicin  cream. Discussed that it is currently approved for diabetic peripheral neuropathy and post-herpetic neuralgia, but that it has also shown benefit in treating other forms of neuropathy. Provided patient with link to site to learn more about the patch: https://www.qutenza .com/. Discussed that the patch would be placed in office and benefits usually last 3 months. Discussed that unintended exposure to capsaicin  can cause severe irritation of eyes, mucous membranes, respiratory tract, and skin, but that Qutenza  is a local treatment and does not have the systemic side effects of other nerve medications. Discussed that there may be pain, itching, erythema, and decreased sensory function associated with the application of Qutenza . Side effects usually subside within 1 week. A cold pack of analgesic medications can help with these side effects. Blood pressure can also be increased due to pain associated with administration of the patch.   1 patch of Qutenza  (35359) was applied to the left shoulder. Ice packs were applied during the procedure to ensure patient comfort. Blood pressure was monitored every 15 minutes. The patient tolerated the procedure well. Post-procedure instructions were given and follow-up has been scheduled.  Topical system measures 14cm x20cm (280cm for a total 1120units) were applied which will cause deeper penetration for destruction of the peripheral nerve using a chemical (Qutenza ) which infuses into the skin like an injection and heat technique (occlusive, compressive dressing cauing endothermic heat technique)

## 2024-02-19 ENCOUNTER — Inpatient Hospital Stay: Attending: Hematology

## 2024-02-19 VITALS — BP 133/70 | HR 58 | Temp 97.1°F | Resp 18

## 2024-02-19 DIAGNOSIS — D509 Iron deficiency anemia, unspecified: Secondary | ICD-10-CM | POA: Diagnosis not present

## 2024-02-19 DIAGNOSIS — D500 Iron deficiency anemia secondary to blood loss (chronic): Secondary | ICD-10-CM

## 2024-02-19 MED ORDER — ACETAMINOPHEN 325 MG PO TABS: 650.0000 mg | ORAL_TABLET | Freq: Once | ORAL | Status: DC

## 2024-02-19 MED ORDER — CETIRIZINE HCL 10 MG/ML IV SOLN: 5.0000 mg | Freq: Once | INTRAVENOUS | Status: DC

## 2024-02-19 MED ORDER — SODIUM CHLORIDE 0.9 % IV SOLN: INTRAVENOUS | Status: DC

## 2024-02-19 MED ORDER — LIDOCAINE-PRILOCAINE 2.5-2.5 % EX CREA: 1.0000 | TOPICAL_CREAM | CUTANEOUS | 1 refills | Status: AC | PRN

## 2024-02-19 MED ORDER — SODIUM CHLORIDE 0.9 % IV SOLN
1000.0000 mg | Freq: Once | INTRAVENOUS | Status: AC
  Administered 2024-02-19: 1000 mg via INTRAVENOUS
  Filled 2024-02-19: qty 1000

## 2024-02-19 NOTE — Patient Instructions (Signed)

## 2024-02-19 NOTE — Progress Notes (Signed)
 Patient took Claritin  and Tylenol  from home.   Patient tolerated iron  infusion with no complaints voiced.  Port IV site clean and dry with good blood return noted before and after infusion.  Band aid applied.  VSS with discharge and left in satisfactory condition with no s/s of distress noted.

## 2024-02-22 NOTE — Progress Notes (Signed)
 Remote pacemaker transmission.

## 2024-03-03 ENCOUNTER — Inpatient Hospital Stay

## 2024-03-03 DIAGNOSIS — D500 Iron deficiency anemia secondary to blood loss (chronic): Secondary | ICD-10-CM

## 2024-03-03 DIAGNOSIS — D509 Iron deficiency anemia, unspecified: Secondary | ICD-10-CM | POA: Diagnosis not present

## 2024-03-03 LAB — CBC
HCT: 33.4 % — ABNORMAL LOW (ref 36.0–46.0)
Hemoglobin: 10.7 g/dL — ABNORMAL LOW (ref 12.0–15.0)
MCH: 30.8 pg (ref 26.0–34.0)
MCHC: 32 g/dL (ref 30.0–36.0)
MCV: 96.3 fL (ref 80.0–100.0)
Platelets: 194 K/uL (ref 150–400)
RBC: 3.47 MIL/uL — ABNORMAL LOW (ref 3.87–5.11)
RDW: 16 % — ABNORMAL HIGH (ref 11.5–15.5)
WBC: 5.6 K/uL (ref 4.0–10.5)
nRBC: 0 % (ref 0.0–0.2)

## 2024-03-03 LAB — SAMPLE TO BLOOD BANK

## 2024-03-03 NOTE — Progress Notes (Signed)
 Haley Trevino Never presented for Portacath access and flush. Proper placement of portacath confirmed by CXR. Portacath located right chest wall accessed with  H 20 needle. Good blood return present. Portacath flushed with 20ml NS and needle removed intact. Procedure without incident. Patient tolerated procedure well.

## 2024-03-03 NOTE — Patient Instructions (Signed)
 [facility] [place] - A DEPT OF . [facility]  Discharge Instructions: Thank you for choosing [facility] to provide your oncology and hematology care.  If you have a lab appointment with the Cancer Center - please note that after April 8th, 2024, all labs will be drawn in the cancer center.  You do not have to check in or register with the main entrance as you have in the past but will complete your check-in in the cancer center.  Wear comfortable clothing and clothing appropriate for easy access to any Portacath or PICC line.   We strive to give you quality time with your provider. You may need to reschedule your appointment if you arrive late (15 or more minutes).  Arriving late affects you and other patients whose appointments are after yours.  Also, if you miss three or more appointments without notifying the office, you may be dismissed from the clinic at the provider's discretion.      For prescription refill requests, have your pharmacy contact our office and allow 72 hours for refills to be completed.    Today you received the following : PF with labs today      To help prevent nausea and vomiting after your treatment, we encourage you to take your nausea medication as directed.  BELOW ARE SYMPTOMS THAT SHOULD BE REPORTED IMMEDIATELY: *FEVER GREATER THAN 100.4 F (38 C) OR HIGHER *CHILLS OR SWEATING *NAUSEA AND VOMITING THAT IS NOT CONTROLLED WITH YOUR NAUSEA MEDICATION *UNUSUAL SHORTNESS OF BREATH *UNUSUAL BRUISING OR BLEEDING *URINARY PROBLEMS (pain or burning when urinating, or frequent urination) *BOWEL PROBLEMS (unusual diarrhea, constipation, pain near the anus) TENDERNESS IN MOUTH AND THROAT WITH OR WITHOUT PRESENCE OF ULCERS (sore throat, sores in mouth, or a toothache) UNUSUAL RASH, SWELLING OR PAIN  UNUSUAL VAGINAL DISCHARGE OR ITCHING   Items with * indicate a potential emergency and should be followed up as soon as possible or go to  the Emergency Department if any problems should occur.  Please show the CHEMOTHERAPY ALERT CARD or IMMUNOTHERAPY ALERT CARD at check-in to the Emergency Department and triage nurse.  Should you have questions after your visit or need to cancel or reschedule your appointment, please contact [facility] [place] - A DEPT OF [facility] [phone]  and follow the prompts.  Office hours are 8:00 a.m. to 4:30 p.m. Monday - Friday. Please note that voicemails left after 4:00 p.m. may not be returned until the following business day.  We are closed weekends and major holidays. You have access to a nurse at all times for urgent questions. Please call the main number to the clinic [phone] and follow the prompts.  For any non-urgent questions, you may also contact your provider using MyChart. We now offer e-Visits for anyone 52 and older to request care online for non-urgent symptoms. For details visit [email or web address].   Also download the MyChart app! Go to the app store, search MyChart, open the app, select [place], and log in with your MyChart username and password.

## 2024-03-24 ENCOUNTER — Encounter (INDEPENDENT_AMBULATORY_CARE_PROVIDER_SITE_OTHER): Payer: Self-pay | Admitting: Otolaryngology

## 2024-03-24 ENCOUNTER — Ambulatory Visit (INDEPENDENT_AMBULATORY_CARE_PROVIDER_SITE_OTHER): Admitting: Otolaryngology

## 2024-03-24 VITALS — BP 129/65 | HR 76 | Temp 97.9°F

## 2024-03-24 DIAGNOSIS — H903 Sensorineural hearing loss, bilateral: Secondary | ICD-10-CM

## 2024-03-24 DIAGNOSIS — H6123 Impacted cerumen, bilateral: Secondary | ICD-10-CM | POA: Diagnosis not present

## 2024-03-24 DIAGNOSIS — D500 Iron deficiency anemia secondary to blood loss (chronic): Secondary | ICD-10-CM

## 2024-03-24 DIAGNOSIS — R42 Dizziness and giddiness: Secondary | ICD-10-CM

## 2024-03-25 ENCOUNTER — Ambulatory Visit (INDEPENDENT_AMBULATORY_CARE_PROVIDER_SITE_OTHER): Payer: Medicare Other

## 2024-03-25 DIAGNOSIS — I5032 Chronic diastolic (congestive) heart failure: Secondary | ICD-10-CM | POA: Diagnosis not present

## 2024-03-25 LAB — CUP PACEART REMOTE DEVICE CHECK
Battery Remaining Longevity: 110 mo
Battery Remaining Percentage: 86 %
Battery Voltage: 3.01 V
Brady Statistic AP VP Percent: 1 %
Brady Statistic AP VS Percent: 3 %
Brady Statistic AS VP Percent: 1.8 %
Brady Statistic AS VS Percent: 95 %
Brady Statistic RA Percent Paced: 2.9 %
Brady Statistic RV Percent Paced: 1.9 %
Date Time Interrogation Session: 20250919040013
Implantable Lead Connection Status: 753985
Implantable Lead Connection Status: 753985
Implantable Lead Implant Date: 20231121
Implantable Lead Implant Date: 20231121
Implantable Lead Location: 753859
Implantable Lead Location: 753860
Implantable Pulse Generator Implant Date: 20231121
Lead Channel Impedance Value: 390 Ohm
Lead Channel Impedance Value: 410 Ohm
Lead Channel Pacing Threshold Amplitude: 0.625 V
Lead Channel Pacing Threshold Amplitude: 0.875 V
Lead Channel Pacing Threshold Pulse Width: 0.4 ms
Lead Channel Pacing Threshold Pulse Width: 0.5 ms
Lead Channel Sensing Intrinsic Amplitude: 0.9 mV
Lead Channel Sensing Intrinsic Amplitude: 3.3 mV
Lead Channel Setting Pacing Amplitude: 1.125
Lead Channel Setting Pacing Amplitude: 1.625
Lead Channel Setting Pacing Pulse Width: 0.5 ms
Lead Channel Setting Sensing Sensitivity: 2 mV
Pulse Gen Model: 2272
Pulse Gen Serial Number: 6860174

## 2024-03-27 NOTE — Progress Notes (Signed)
 Patient ID: Haley Trevino, female   DOB: March 05, 1941, 83 y.o.   MRN: [id number]  Follow-up: Hearing loss, recurrent dizziness  HPI: The patient is an 83 year old female who returns today for follow-up evaluation.  The patient has a history of recurrent dizziness and bilateral high-frequency sensorineural hearing loss.  At her last visit in March 2025, she was noted to have stable bilateral high-frequency sensorineural hearing loss.  Her dizziness was described as an off-balance sensation.  The patient returns today complaining of intermittent dizziness/lightheadedness.  She denies any spinning vertigo.  She has a history of chronic anemia.  A port was recently placed for frequent blood transfusion.  She also has a pacemaker.  She denies any recent change in her hearing.  Currently she denies any otalgia, otorrhea, or vertigo.  Exam: General: Communicates without difficulty, well nourished, no acute distress. Head: Normocephalic, no evidence injury, no tenderness, facial buttresses intact without stepoff. Face/sinus: No tenderness to palpation and percussion. Facial movement is normal and symmetric. Eyes: PERRL, EOMI. No scleral icterus, conjunctivae clear. Neuro: CN II exam reveals vision grossly intact.  No nystagmus at any point of gaze. Ears: Auricles well formed without lesions.  Bilateral cerumen impaction.  Nose: External evaluation reveals normal support and skin without lesions.  Dorsum is intact.  Anterior rhinoscopy reveals congested mucosa over anterior aspect of inferior turbinates and intact septum.  No purulence noted. Oral:  Oral cavity and oropharynx are intact, symmetric, without erythema or edema.  Mucosa is moist without lesions. Neck: Full range of motion without pain.  There is no significant lymphadenopathy.  No masses palpable.  Thyroid  bed within normal limits to palpation.  Parotid glands and submandibular glands equal bilaterally without mass.  Trachea is midline. Neuro:  CN 2-12  grossly intact. Gait wide-based. Vestibular: No nystagmus at any point of gaze. The cerebellar examination is unremarkable.   Procedure: Bilateral cerumen disimpaction Anesthesia: None Description: Under the operating microscope, the cerumen is carefully removed with a combination of cerumen currette, alligator forceps, and suction catheters.  After the cerumen is removed, the TMs are noted to be normal.  No mass, erythema, or lesions. The patient tolerated the procedure well.    Assessment: 1.  Subjectively stable bilateral high-frequency sensorineural hearing loss. 2.  Recurrent dizziness of unknown etiology.  The patient has multiple risk factors, including chronic anemia and the use of pacemaker.  Other possible differential diagnoses include transient BPPV, vestibular migraine, Meniere's disease, peripheral vestibular dysfunction, or other central/systemic causes.   3.  Bilateral cerumen impaction.  After the disimpaction procedure, both tympanic membranes and middle ear spaces are noted to be normal.  Plan: 1.  Otomicroscopy with bilateral cerumen disimpaction. 2.  The physical exam findings are reviewed with the patient. 3.  The patient is a candidate for hearing amplification.  The hearing aid options are discussed. 4.  The pathophysiology of vestibular dysfunction and dizziness are discussed extensively with the patient. The possible differential diagnoses are reviewed. Questions are invited and answered.   5.  The option of physical therapy to treat her dizziness/imbalance is also discussed. 6.  The patient will return for reevaluation in 6 months, sooner if needed.

## 2024-03-29 NOTE — Progress Notes (Signed)
 Remote PPM Transmission

## 2024-03-30 ENCOUNTER — Ambulatory Visit: Payer: Self-pay | Admitting: Cardiology

## 2024-04-04 ENCOUNTER — Inpatient Hospital Stay: Attending: Hematology

## 2024-04-04 DIAGNOSIS — D500 Iron deficiency anemia secondary to blood loss (chronic): Secondary | ICD-10-CM

## 2024-04-04 DIAGNOSIS — D509 Iron deficiency anemia, unspecified: Secondary | ICD-10-CM | POA: Diagnosis not present

## 2024-04-04 LAB — CBC
HCT: 32.8 % — ABNORMAL LOW (ref 36.0–46.0)
Hemoglobin: 10.7 g/dL — ABNORMAL LOW (ref 12.0–15.0)
MCH: 31.8 pg (ref 26.0–34.0)
MCHC: 32.6 g/dL (ref 30.0–36.0)
MCV: 97.3 fL (ref 80.0–100.0)
Platelets: 237 K/uL (ref 150–400)
RBC: 3.37 MIL/uL — ABNORMAL LOW (ref 3.87–5.11)
RDW: 14.6 % (ref 11.5–15.5)
WBC: 6.5 K/uL (ref 4.0–10.5)
nRBC: 0 % (ref 0.0–0.2)

## 2024-04-04 LAB — SAMPLE TO BLOOD BANK

## 2024-04-04 NOTE — Progress Notes (Signed)
 Patients port flushed without difficulty.  Good blood return noted with no bruising or swelling noted at site.  Band aid applied.  VSS with discharge and left in satisfactory condition with no s/s of distress noted.

## 2024-04-04 NOTE — Patient Instructions (Signed)

## 2024-04-21 ENCOUNTER — Other Ambulatory Visit: Payer: Self-pay | Admitting: Nurse Practitioner

## 2024-04-21 ENCOUNTER — Ambulatory Visit: Admitting: Nurse Practitioner

## 2024-04-21 ENCOUNTER — Encounter: Payer: Self-pay | Admitting: Nurse Practitioner

## 2024-04-21 VITALS — BP 118/66 | HR 83 | Temp 99.3°F | Ht 62.0 in | Wt 103.0 lb

## 2024-04-21 DIAGNOSIS — K921 Melena: Secondary | ICD-10-CM

## 2024-04-21 DIAGNOSIS — M349 Systemic sclerosis, unspecified: Secondary | ICD-10-CM

## 2024-04-21 DIAGNOSIS — Z23 Encounter for immunization: Secondary | ICD-10-CM

## 2024-04-21 LAB — URINALYSIS, COMPLETE
Bilirubin, UA: NEGATIVE
Glucose, UA: NEGATIVE
Ketones, UA: NEGATIVE
Leukocytes,UA: NEGATIVE
Nitrite, UA: NEGATIVE
Protein,UA: NEGATIVE
RBC, UA: NEGATIVE
Specific Gravity, UA: 1.01 (ref 1.005–1.030)
Urobilinogen, Ur: 0.2 mg/dL (ref 0.2–1.0)
pH, UA: 6 (ref 5.0–7.5)

## 2024-04-21 NOTE — Progress Notes (Signed)
 Subjective:    Patient ID: Haley Trevino, female    DOB: 1941/06/06, 83 y.o.   MRN: [id number]  Chief Complaint: vertigo  Dizziness Associated symptoms include fatigue. Pertinent negatives include no abdominal pain, chest pain, diaphoresis, headaches, rash or weakness.    Patient come sn stating taht she is I bad shape. She is having SOB and cannnot even climb steps. She has been falling and feels weak she has been  to ED several times and all test are negative for heart. Has seen pulmonology and they were unable to help her. She has history scleroderma and she was told it is worsening. Someone thinks she may have CREST instead of scleraderma  Patient Active Problem List   Diagnosis Date Noted   PAF (paroxysmal atrial fibrillation) (HCC) 02/08/2024   GI bleed 11/23/2023   Lower abdominal pain 11/23/2023   Dizziness 09/22/2023   Sensorineural hearing loss, bilateral 09/22/2023   Impacted cerumen of both ears 09/22/2023   Syncope 05/19/2023   Pulmonary HTN (HCC) 12/18/2022   Heart block AV complete (HCC) 05/27/2022   S/P TAVR (transcatheter aortic valve replacement) 05/20/2022   Anxiety 05/14/2022   Hyperkalemia 05/14/2022   Chronic diastolic CHF (congestive heart failure) (HCC) 05/13/2022   Acute respiratory distress 05/13/2022   Nonrheumatic aortic valve stenosis 04/28/2022   Precordial chest pain 03/13/2022   History of adenomatous polyp of colon 09/21/2020   Weight loss 09/21/2020   Irritable bowel syndrome 09/21/2020   Breast implant rupture 09/04/2020   Iron  deficiency anemia due to chronic blood loss 08/28/2020   DOE (dyspnea on exertion) 11/18/2017   Murmur 11/18/2017   Impingement syndrome of left shoulder region 12/08/2014   Osteoporosis 09/20/2014   Hip fracture requiring operative repair (HCC) 02/22/2014   Hypothyroidism 11/29/2013   Depression 11/29/2013   GERD (gastroesophageal reflux disease) 11/29/2013   Hypertension 11/29/2013   Hyperlipidemia with  target LDL less than 100 11/29/2013   Thyroid  cyst 04/26/2013   Melanoma of skin (HCC) 08/07/2010   [name] 08/06/2010   SCLERODERMA 08/06/2010   Osteoarthritis 08/06/2010       Review of Systems  Constitutional:  Positive for fatigue. Negative for diaphoresis.  Eyes:  Positive for discharge. Negative for pain.  Respiratory:  Positive for shortness of breath.   Cardiovascular:  Negative for chest pain, palpitations and leg swelling.  Gastrointestinal:  Negative for abdominal pain.  Endocrine: Negative for polydipsia.  Skin:  Negative for rash.  Neurological:  Positive for dizziness and tremors. Negative for weakness and headaches.  Hematological:  Does not bruise/bleed easily.  All other systems reviewed and are negative.      Objective:   Physical Exam Constitutional:      Appearance: Normal appearance.  Cardiovascular:     Rate and Rhythm: Normal rate and regular rhythm.     Heart sounds: Normal heart sounds.  Pulmonary:     Breath sounds: Normal breath sounds.     Comments: Port side right chest wall normal appearing. Skin:    General: Skin is warm.  Neurological:     General: No focal deficit present.     Mental Status: She is alert and oriented to person, place, and time.  Psychiatric:        Mood and Affect: Mood normal.        Behavior: Behavior normal.       BP 118/66   Pulse 83   Temp 99.3 F (37.4 C) (Temporal)   Ht 5' 2 (1.575 m)  Wt 103 lb (46.7 kg)   SpO2 97%   BMI 18.84 kg/m      Assessment & Plan:   Rudell JAYSON Never in today with chief complaint of Dizziness   1. Scleroderma (HCC) (Primary) Referral for second opinion - Ambulatory referral to Rheumatology  2. Black stool Cbc repeated today Keep follow up for iron  infusion    The above assessment and management plan was discussed with the patient. The patient verbalized understanding of and has agreed to the management plan. Patient is aware to call the clinic if  symptoms persist or worsen. Patient is aware when to return to the clinic for a follow-up visit. Patient educated on when it is appropriate to go to the emergency department.   Mary-Margaret Gladis, FNP

## 2024-04-22 ENCOUNTER — Ambulatory Visit: Payer: Self-pay | Admitting: Nurse Practitioner

## 2024-04-23 LAB — CBC WITH DIFFERENTIAL/PLATELET
Basophils Absolute: 0.1 x10E3/uL (ref 0.0–0.2)
Basos: 1 %
EOS (ABSOLUTE): 0.2 x10E3/uL (ref 0.0–0.4)
Eos: 3 %
Hematocrit: 36.5 % (ref 34.0–46.6)
Hemoglobin: 11.7 g/dL (ref 11.1–15.9)
Immature Grans (Abs): 0 x10E3/uL (ref 0.0–0.1)
Immature Granulocytes: 0 %
Lymphocytes Absolute: 1.4 x10E3/uL (ref 0.7–3.1)
Lymphs: 20 %
MCH: 31.6 pg (ref 26.6–33.0)
MCHC: 32.1 g/dL (ref 31.5–35.7)
MCV: 99 fL — ABNORMAL HIGH (ref 79–97)
Monocytes Absolute: 0.5 x10E3/uL (ref 0.1–0.9)
Monocytes: 7 %
Neutrophils Absolute: 4.8 x10E3/uL (ref 1.4–7.0)
Neutrophils: 69 %
Platelets: 268 x10E3/uL (ref 150–450)
RBC: 3.7 x10E6/uL — ABNORMAL LOW (ref 3.77–5.28)
RDW: 13 % (ref 11.7–15.4)
WBC: 6.9 x10E3/uL (ref 3.4–10.8)

## 2024-04-29 DIAGNOSIS — R109 Unspecified abdominal pain: Secondary | ICD-10-CM | POA: Diagnosis not present

## 2024-04-29 DIAGNOSIS — K921 Melena: Secondary | ICD-10-CM | POA: Diagnosis not present

## 2024-04-29 DIAGNOSIS — D509 Iron deficiency anemia, unspecified: Secondary | ICD-10-CM | POA: Diagnosis not present

## 2024-04-29 DIAGNOSIS — Q2733 Arteriovenous malformation of digestive system vessel: Secondary | ICD-10-CM | POA: Diagnosis not present

## 2024-05-10 NOTE — Progress Notes (Unsigned)
 VIRTUAL VISIT via TELEPHONE NOTE [facility]   I connected with Haley Trevino  on 05/11/24 at  1:05 PM by telephone and verified that I am speaking with the correct person using two identifiers.  Location: Patient: Home Provider: [facility]   I discussed the limitations, risks, security and privacy concerns of performing an evaluation and management service by telephone and the availability of in person appointments. I also discussed with the patient that there may be a patient responsible charge related to this service. The patient expressed understanding and agreed to proceed.  REASON FOR VISIT:  Follow-up for iron  deficiency anemia   CURRENT THERAPY: IV iron  infusions with intermittent PRBC transfusions  INTERVAL HISTORY:  Haley Trevino (83 y.o. female) returns for routine follow-up of iron  deficiency anemia.   She was last evaluated via telemedicine visit by Pleasant Barefoot PA-C on 02/02/2024. She had IR-guided port placement on 02/12/2024. She received IV Monoferric  on 02/19/2024. She has not had any interim hospitalizations in the past 2 months.  At today's visit, she reports feeling somewhat poorly due to fatigue, worsening scleroderma, and other comorbidities. No recurrent rectal bleeding since hospitalization in May 2025. She did have some dark-colored and shiny stool a few weeks ago. She is following with GI (Dr. Rosalie).  She has ongoing diffuse musculoskeletal pain from her fibromyalgia and other chronic comorbidities. She also reports vertigo, tremors, general malaise, and headaches. She has not had any recurrent syncope, but has some lightheadedness and feeling off-balance. She has worsening dyspnea on exertion that she attributes to progressive scleroderma. She has ongoing musculoskeletal chest discomfort related to scar tissue (s/p implant removal) and scleroderma, but denies any exertional chest pain. She remains off of  Eliquis  to date, but is taking 81 mg aspirin . She reports little to no energy and 25% appetite.   ASSESSMENT & PLAN:  1.    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 - Was placed on Eliquis  for new onset A-fib/flutter in November 2023, but developed profuse melanotic stool within 48 hours of starting Eliquis .  Patient self discontinued her Eliquis  and within 24 hours of stopping Eliquis  her bowel movements had returned to normal. - Severe anemia with Hgb 6.6 on 06/09/2022, therefore transfused PRBC x 2 units. - She continues to follow with GI (Dr. Rosalie via [facility]) - Most recent IV iron  with Feraheme x 3 in February 2025 - Hospitalized in May 2025 for hematochezia (suspected diverticular bleed), but did not require blood transfusion or endoscopy/colonoscopy. - No recurrent hematochezia since hospitalization in May 2025.  She continues to have intermittent melena. - Labs (05/11/2024):  Hgb 9.7 (down from 11.7 three weeks ago), ferritin 74, iron  saturation 20% - Differential diagnosis favors iron  deficiency anemia secondary to chronic GI blood loss; may also be an aspect of malabsorption in the setting of scleroderma, low meat intake, and use of PPI - PLAN: Recommend IV Monoferric  x 1 (PREMED w/ IV  cetirizine ) - Monthly CBC/D with BB sample - Labs (CBC/D, BB sample, ferritin, iron /TIBC) and  RTC in 3 months with PHONE visit  - Recommend continued outpatient GI follow-up - instructed to notify Dr. Rosalie of any ongoing melena - Referral for port due to poor peripheral IV access and anticipated ongoing need for frequent labs, IV infusions, and possible blood transfusions  PLAN SUMMARY: (last appointment of the day please)  >> IV Monoferric  x1 >> Monthly CBC/BB sample via PORT >> PF / Labs in 3 months = CBC/D, ferritin, iron /TIBC, BB sample >> PHONE visit in 3 months (after labs)  ** Last office visit 09/28/23      REVIEW OF SYSTEMS:   Review of Systems  Constitutional:  Positive for malaise/fatigue. Negative for chills, diaphoresis, fever and weight loss.  Respiratory:  Positive for cough and shortness of breath (with exertion).   Cardiovascular:  Positive for chest pain (scar tissue) and palpitations.  Gastrointestinal:  Positive for nausea. Negative for abdominal pain, blood in stool, melena and vomiting.  Musculoskeletal:  Positive for joint pain.  Neurological:  Positive for dizziness and headaches.  Psychiatric/Behavioral:  Positive for depression. The patient is nervous/anxious.      PHYSICAL EXAM: (per limitations of virtual telephone visit)  The patient is alert and oriented x 3, exhibiting adequate mentation, good mood, and ability to speak in full sentences and execute sound judgement.  WRAP UP:   I discussed the assessment and treatment plan with the patient. The patient was provided an opportunity to ask questions and all were answered. The patient agreed with the plan and demonstrated an understanding of the instructions.   The patient was advised to call back or seek an in-person evaluation if the symptoms worsen or if the condition fails to improve as anticipated.  I provided 32 minutes of non-face-to-face time during this encounter, including >10 minutes of medical discussion.  Pleasant CHRISTELLA Barefoot, PA-C 05/11/24 1:31 PM

## 2024-05-11 ENCOUNTER — Inpatient Hospital Stay

## 2024-05-11 ENCOUNTER — Inpatient Hospital Stay: Attending: Hematology | Admitting: Physician Assistant

## 2024-05-11 DIAGNOSIS — D500 Iron deficiency anemia secondary to blood loss (chronic): Secondary | ICD-10-CM

## 2024-05-11 DIAGNOSIS — I4891 Unspecified atrial fibrillation: Secondary | ICD-10-CM | POA: Diagnosis not present

## 2024-05-11 DIAGNOSIS — M349 Systemic sclerosis, unspecified: Secondary | ICD-10-CM | POA: Insufficient documentation

## 2024-05-11 DIAGNOSIS — D509 Iron deficiency anemia, unspecified: Secondary | ICD-10-CM | POA: Diagnosis not present

## 2024-05-11 DIAGNOSIS — Z7901 Long term (current) use of anticoagulants: Secondary | ICD-10-CM | POA: Insufficient documentation

## 2024-05-11 LAB — CBC WITH DIFFERENTIAL/PLATELET
Abs Immature Granulocytes: 0.01 K/uL (ref 0.00–0.07)
Basophils Absolute: 0 K/uL (ref 0.0–0.1)
Basophils Relative: 1 %
Eosinophils Absolute: 0.1 K/uL (ref 0.0–0.5)
Eosinophils Relative: 3 %
HCT: 29.8 % — ABNORMAL LOW (ref 36.0–46.0)
Hemoglobin: 9.7 g/dL — ABNORMAL LOW (ref 12.0–15.0)
Immature Granulocytes: 0 %
Lymphocytes Relative: 20 %
Lymphs Abs: 0.8 K/uL (ref 0.7–4.0)
MCH: 31.8 pg (ref 26.0–34.0)
MCHC: 32.6 g/dL (ref 30.0–36.0)
MCV: 97.7 fL (ref 80.0–100.0)
Monocytes Absolute: 0.3 K/uL (ref 0.1–1.0)
Monocytes Relative: 7 %
Neutro Abs: 2.8 K/uL (ref 1.7–7.7)
Neutrophils Relative %: 69 %
Platelets: 215 K/uL (ref 150–400)
RBC: 3.05 MIL/uL — ABNORMAL LOW (ref 3.87–5.11)
RDW: 14.3 % (ref 11.5–15.5)
WBC: 4.1 K/uL (ref 4.0–10.5)
nRBC: 0 % (ref 0.0–0.2)

## 2024-05-11 LAB — IRON AND TIBC
Iron: 64 ug/dL (ref 28–170)
Saturation Ratios: 20 % (ref 10.4–31.8)
TIBC: 314 ug/dL (ref 250–450)
UIBC: 250 ug/dL

## 2024-05-11 LAB — SAMPLE TO BLOOD BANK

## 2024-05-11 LAB — FERRITIN: Ferritin: 74 ng/mL (ref 11–307)

## 2024-05-11 NOTE — Patient Instructions (Signed)
 [facility] [place] [facility]  Discharge Instructions: Thank you for choosing [facility] to provide your oncology and hematology care.  If you have a lab appointment with the Cancer Center - please note that after April 8th, 2024, all labs will be drawn in the cancer center.  You do not have to check in or register with the main entrance as you have in the past but will complete your check-in in the cancer center.  Wear comfortable clothing and clothing appropriate for easy access to any Portacath or PICC line.   We strive to give you quality time with your provider. You may need to reschedule your appointment if you arrive late (15 or more minutes).  Arriving late affects you and other patients whose appointments are after yours.  Also, if you miss three or more appointments without notifying the office, you may be dismissed from the clinic at the provider's discretion.      For prescription refill requests, have your pharmacy contact our office and allow 72 hours for refills to be completed.    Today you received the following chemotherapy and/or immunotherapy agents port flush lab      To help prevent nausea and vomiting after your treatment, we encourage you to take your nausea medication as directed.  BELOW ARE SYMPTOMS THAT SHOULD BE REPORTED IMMEDIATELY: *FEVER GREATER THAN 100.4 F (38 C) OR HIGHER *CHILLS OR SWEATING *NAUSEA AND VOMITING THAT IS NOT CONTROLLED WITH YOUR NAUSEA MEDICATION *UNUSUAL SHORTNESS OF BREATH *UNUSUAL BRUISING OR BLEEDING *URINARY PROBLEMS (pain or burning when urinating, or frequent urination) *BOWEL PROBLEMS (unusual diarrhea, constipation, pain near the anus) TENDERNESS IN MOUTH AND THROAT WITH OR WITHOUT PRESENCE OF ULCERS (sore throat, sores in mouth, or a toothache) UNUSUAL RASH, SWELLING OR PAIN  UNUSUAL VAGINAL DISCHARGE OR ITCHING   Items with * indicate a potential emergency and should be  followed up as soon as possible or go to the Emergency Department if any problems should occur.  Please show the CHEMOTHERAPY ALERT CARD or IMMUNOTHERAPY ALERT CARD at check-in to the Emergency Department and triage nurse.  Should you have questions after your visit or need to cancel or reschedule your appointment, please contact [facility] [place] - A DEPT OF [facility] Bridegroom [facility] [phone]  and follow the prompts.  Office hours are 8:00 a.m. to 4:30 p.m. Monday - Friday. Please note that voicemails left after 4:00 p.m. may not be returned until the following business day.  We are closed weekends and major holidays. You have access to a nurse at all times for urgent questions. Please call the main number to the clinic [phone] and follow the prompts.  For any non-urgent questions, you may also contact your provider using MyChart. We now offer e-Visits for anyone 1 and older to request care online for non-urgent symptoms. For details visit [email or web address].   Also download the MyChart app! Go to the app store, search "MyChart", open the app, select [place], and log in with your MyChart username and password.

## 2024-05-11 NOTE — Progress Notes (Signed)
,  Patients port flushed without difficulty.  Good blood return noted with no bruising or swelling noted at site.  Band aid applied.  VSS with discharge and left in satisfactory condition with no s/s of distress noted.

## 2024-05-13 ENCOUNTER — Telehealth: Payer: Self-pay

## 2024-05-13 DIAGNOSIS — G8929 Other chronic pain: Secondary | ICD-10-CM

## 2024-05-13 MED ORDER — QUTENZA 8 % EX KIT: 1.0000 | PACK | Freq: Once | CUTANEOUS | 0 refills | Status: AC

## 2024-05-13 NOTE — Telephone Encounter (Signed)
 Prescription sent to [facility]

## 2024-05-16 ENCOUNTER — Inpatient Hospital Stay

## 2024-05-16 VITALS — BP 122/61 | HR 72 | Temp 97.8°F | Resp 18

## 2024-05-16 DIAGNOSIS — I4891 Unspecified atrial fibrillation: Secondary | ICD-10-CM | POA: Diagnosis not present

## 2024-05-16 DIAGNOSIS — D509 Iron deficiency anemia, unspecified: Secondary | ICD-10-CM | POA: Diagnosis not present

## 2024-05-16 DIAGNOSIS — D500 Iron deficiency anemia secondary to blood loss (chronic): Secondary | ICD-10-CM

## 2024-05-16 DIAGNOSIS — M349 Systemic sclerosis, unspecified: Secondary | ICD-10-CM | POA: Diagnosis not present

## 2024-05-16 DIAGNOSIS — Z7901 Long term (current) use of anticoagulants: Secondary | ICD-10-CM | POA: Diagnosis not present

## 2024-05-16 MED ORDER — HEPARIN SOD (PORK) LOCK FLUSH 100 UNIT/ML IV SOLN: 500.0000 [IU] | Freq: Once | INTRAVENOUS | Status: DC | PRN

## 2024-05-16 MED ORDER — SODIUM CHLORIDE 0.9 % IV SOLN: INTRAVENOUS | Status: DC

## 2024-05-16 MED ORDER — HEPARIN SOD (PORK) LOCK FLUSH 100 UNIT/ML IV SOLN: 250.0000 [IU] | Freq: Once | INTRAVENOUS | Status: DC | PRN

## 2024-05-16 MED ORDER — ALTEPLASE 2 MG IJ SOLR: 2.0000 mg | Freq: Once | INTRAMUSCULAR | Status: DC | PRN

## 2024-05-16 MED ORDER — SODIUM CHLORIDE 0.9% FLUSH: 10.0000 mL | Freq: Once | INTRAVENOUS | Status: DC | PRN

## 2024-05-16 MED ORDER — SODIUM CHLORIDE 0.9 % IV SOLN
1000.0000 mg | Freq: Once | INTRAVENOUS | Status: AC
  Administered 2024-05-16: 1000 mg via INTRAVENOUS
  Filled 2024-05-16: qty 1000
  Filled 2024-05-16: qty 10

## 2024-05-16 MED ORDER — SODIUM CHLORIDE 0.9% FLUSH: 3.0000 mL | Freq: Once | INTRAVENOUS | Status: DC | PRN

## 2024-05-16 NOTE — Progress Notes (Signed)
 Patient presents today for iron  infusion.  Patient is in satisfactory condition with no new complaints voiced.  Vital signs are stable.  We will proceed with infusion per provider orders.    Patient took pre-meds at home prior to arrival.   Monoferric  1,000 mg  given today per MD orders. Tolerated infusion without adverse affects. Vital signs stable. No complaints at this time. Discharged from clinic via wheelchair in stable condition. Alert and oriented x 3. F/U with [facility] as scheduled.

## 2024-05-16 NOTE — Patient Instructions (Signed)
 [facility] [place] [facility]  Discharge Instructions: Thank you for choosing [facility] to provide your oncology and hematology care.  If you have a lab appointment with the Cancer Center - please note that after April 8th, 2024, all labs will be drawn in the cancer center.  You do not have to check in or register with the main entrance as you have in the past but will complete your check-in in the cancer center.  Wear comfortable clothing and clothing appropriate for easy access to any Portacath or PICC line.   We strive to give you quality time with your provider. You may need to reschedule your appointment if you arrive late (15 or more minutes).  Arriving late affects you and other patients whose appointments are after yours.  Also, if you miss three or more appointments without notifying the office, you may be dismissed from the clinic at the provider's discretion.      For prescription refill requests, have your pharmacy contact our office and allow 72 hours for refills to be completed.    Today you received Monoferric IV iron infusion.     BELOW ARE SYMPTOMS THAT SHOULD BE REPORTED IMMEDIATELY: *FEVER GREATER THAN 100.4 F (38 C) OR HIGHER *CHILLS OR SWEATING *NAUSEA AND VOMITING THAT IS NOT CONTROLLED WITH YOUR NAUSEA MEDICATION *UNUSUAL SHORTNESS OF BREATH *UNUSUAL BRUISING OR BLEEDING *URINARY PROBLEMS (pain or burning when urinating, or frequent urination) *BOWEL PROBLEMS (unusual diarrhea, constipation, pain near the anus) TENDERNESS IN MOUTH AND THROAT WITH OR WITHOUT PRESENCE OF ULCERS (sore throat, sores in mouth, or a toothache) UNUSUAL RASH, SWELLING OR PAIN  UNUSUAL VAGINAL DISCHARGE OR ITCHING   Items with * indicate a potential emergency and should be followed up as soon as possible or go to the Emergency Department if any problems should occur.  Please show the CHEMOTHERAPY ALERT CARD or IMMUNOTHERAPY ALERT CARD  at check-in to the Emergency Department and triage nurse.  Should you have questions after your visit or need to cancel or reschedule your appointment, please contact [facility] [place] - A DEPT OF Eligha Bridegroom [facility] [phone]  and follow the prompts.  Office hours are 8:00 a.m. to 4:30 p.m. Monday - Friday. Please note that voicemails left after 4:00 p.m. may not be returned until the following business day.  We are closed weekends and major holidays. You have access to a nurse at all times for urgent questions. Please call the main number to the clinic [phone] and follow the prompts.  For any non-urgent questions, you may also contact your provider using MyChart. We now offer e-Visits for anyone 35 and older to request care online for non-urgent symptoms. For details visit [email or web address].   Also download the MyChart app! Go to the app store, search "MyChart", open the app, select [place], and log in with your MyChart username and password.

## 2024-05-19 ENCOUNTER — Telehealth: Payer: Self-pay

## 2024-05-19 NOTE — Telephone Encounter (Signed)
 Can postherpetic neuralgia for the diagnosis for patches Qutenza ?

## 2024-05-23 ENCOUNTER — Encounter: Payer: Self-pay | Admitting: Physical Medicine and Rehabilitation

## 2024-05-23 ENCOUNTER — Encounter: Attending: Physical Medicine and Rehabilitation | Admitting: Physical Medicine and Rehabilitation

## 2024-05-23 VITALS — BP 151/81 | HR 77 | Ht 62.0 in | Wt 102.0 lb

## 2024-05-23 DIAGNOSIS — M25512 Pain in left shoulder: Secondary | ICD-10-CM | POA: Insufficient documentation

## 2024-05-23 DIAGNOSIS — G8929 Other chronic pain: Secondary | ICD-10-CM | POA: Insufficient documentation

## 2024-05-23 MED ORDER — CAPSAICIN-CLEANSING GEL 8 % EX KIT
1.0000 | PACK | Freq: Once | CUTANEOUS | Status: AC
  Administered 2024-05-23: 1 via TOPICAL

## 2024-05-23 NOTE — Progress Notes (Signed)
-  Discussed Qutenza  as an option for neuropathic pain control. Discussed that this is a capsaicin  patch, stronger than capsaicin  cream. Discussed that it is currently approved for diabetic peripheral neuropathy and post-herpetic neuralgia, but that it has also shown benefit in treating other forms of neuropathy. Provided patient with link to site to learn more about the patch: https://www.qutenza .com/. Discussed that the patch would be placed in office and benefits usually last 3 months. Discussed that unintended exposure to capsaicin  can cause severe irritation of eyes, mucous membranes, respiratory tract, and skin, but that Qutenza  is a local treatment and does not have the systemic side effects of other nerve medications. Discussed that there may be pain, itching, erythema, and decreased sensory function associated with the application of Qutenza . Side effects usually subside within 1 week. A cold pack of analgesic medications can help with these side effects. Blood pressure can also be increased due to pain associated with administration of the patch.   1 patch of Qutenza  (35359) was applied to the left shoulder. Ice packs were applied during the procedure to ensure patient comfort. Blood pressure was monitored every 15 minutes. The patient tolerated the procedure well. Post-procedure instructions were given and follow-up has been scheduled.  Topical system measures 14cm x20cm (280cm for a total 1120units) were applied which will cause deeper penetration for destruction of the peripheral nerve using a chemical (Qutenza ) which infuses into the skin like an injection and heat technique (occlusive, compressive dressing cauing endothermic heat technique)

## 2024-05-24 DIAGNOSIS — B079 Viral wart, unspecified: Secondary | ICD-10-CM | POA: Diagnosis not present

## 2024-05-24 DIAGNOSIS — D0439 Carcinoma in situ of skin of other parts of face: Secondary | ICD-10-CM | POA: Diagnosis not present

## 2024-05-24 DIAGNOSIS — D485 Neoplasm of uncertain behavior of skin: Secondary | ICD-10-CM | POA: Diagnosis not present

## 2024-05-24 NOTE — Addendum Note (Signed)
 Addended by: Lenita Peregrina M on: 05/24/2024 12:36 PM   Modules accepted: Orders

## 2024-05-25 MED ORDER — CAPSAICIN-CLEANSING GEL 8 % EX KIT
1.0000 | PACK | Freq: Once | CUTANEOUS | Status: AC
  Administered 2024-05-23: 1 via TOPICAL

## 2024-05-25 NOTE — Addendum Note (Signed)
 Addended by: LORILEE SVEN SQUIBB on: 05/25/2024 02:27 PM   Modules accepted: Orders

## 2024-06-07 DIAGNOSIS — K5520 Angiodysplasia of colon without hemorrhage: Secondary | ICD-10-CM | POA: Diagnosis not present

## 2024-06-07 DIAGNOSIS — K219 Gastro-esophageal reflux disease without esophagitis: Secondary | ICD-10-CM | POA: Diagnosis not present

## 2024-06-07 DIAGNOSIS — I7300 Raynaud's syndrome without gangrene: Secondary | ICD-10-CM | POA: Diagnosis not present

## 2024-06-07 DIAGNOSIS — M1990 Unspecified osteoarthritis, unspecified site: Secondary | ICD-10-CM | POA: Diagnosis not present

## 2024-06-07 DIAGNOSIS — M349 Systemic sclerosis, unspecified: Secondary | ICD-10-CM | POA: Diagnosis not present

## 2024-06-07 DIAGNOSIS — M2550 Pain in unspecified joint: Secondary | ICD-10-CM | POA: Diagnosis not present

## 2024-06-07 DIAGNOSIS — D509 Iron deficiency anemia, unspecified: Secondary | ICD-10-CM | POA: Diagnosis not present

## 2024-06-07 DIAGNOSIS — I272 Pulmonary hypertension, unspecified: Secondary | ICD-10-CM | POA: Diagnosis not present

## 2024-06-09 ENCOUNTER — Inpatient Hospital Stay: Attending: Hematology

## 2024-06-09 DIAGNOSIS — D509 Iron deficiency anemia, unspecified: Secondary | ICD-10-CM | POA: Insufficient documentation

## 2024-06-09 DIAGNOSIS — D500 Iron deficiency anemia secondary to blood loss (chronic): Secondary | ICD-10-CM

## 2024-06-09 LAB — CBC
HCT: 26.1 % — ABNORMAL LOW (ref 36.0–46.0)
Hemoglobin: 9.6 g/dL — ABNORMAL LOW (ref 12.0–15.0)
MCH: 38.7 pg — ABNORMAL HIGH (ref 26.0–34.0)
MCHC: 36.8 g/dL — ABNORMAL HIGH (ref 30.0–36.0)
MCV: 105.2 fL — ABNORMAL HIGH (ref 80.0–100.0)
Platelets: 221 K/uL (ref 150–400)
RBC: 2.48 MIL/uL — ABNORMAL LOW (ref 3.87–5.11)
RDW: 17.6 % — ABNORMAL HIGH (ref 11.5–15.5)
WBC: 5.5 K/uL (ref 4.0–10.5)
nRBC: 0 % (ref 0.0–0.2)

## 2024-06-09 LAB — SAMPLE TO BLOOD BANK

## 2024-06-09 NOTE — Patient Instructions (Signed)
 [facility] [place] [facility]  Discharge Instructions: Thank you for choosing [facility] to provide your oncology and hematology care.  If you have a lab appointment with the Cancer Center - please note that after April 8th, 2024, all labs will be drawn in the cancer center.  You do not have to check in or register with the main entrance as you have in the past but will complete your check-in in the cancer center.  Wear comfortable clothing and clothing appropriate for easy access to any Portacath or PICC line.   We strive to give you quality time with your provider. You may need to reschedule your appointment if you arrive late (15 or more minutes).  Arriving late affects you and other patients whose appointments are after yours.  Also, if you miss three or more appointments without notifying the office, you may be dismissed from the clinic at the provider's discretion.      For prescription refill requests, have your pharmacy contact our office and allow 72 hours for refills to be completed.    Today you received the following port flushed with lab draw, return as scheduled.   To help prevent nausea and vomiting after your treatment, we encourage you to take your nausea medication as directed.  BELOW ARE SYMPTOMS THAT SHOULD BE REPORTED IMMEDIATELY: *FEVER GREATER THAN 100.4 F (38 C) OR HIGHER *CHILLS OR SWEATING *NAUSEA AND VOMITING THAT IS NOT CONTROLLED WITH YOUR NAUSEA MEDICATION *UNUSUAL SHORTNESS OF BREATH *UNUSUAL BRUISING OR BLEEDING *URINARY PROBLEMS (pain or burning when urinating, or frequent urination) *BOWEL PROBLEMS (unusual diarrhea, constipation, pain near the anus) TENDERNESS IN MOUTH AND THROAT WITH OR WITHOUT PRESENCE OF ULCERS (sore throat, sores in mouth, or a toothache) UNUSUAL RASH, SWELLING OR PAIN  UNUSUAL VAGINAL DISCHARGE OR ITCHING   Items with * indicate a potential emergency and should be followed up as  soon as possible or go to the Emergency Department if any problems should occur.  Please show the CHEMOTHERAPY ALERT CARD or IMMUNOTHERAPY ALERT CARD at check-in to the Emergency Department and triage nurse.  Should you have questions after your visit or need to cancel or reschedule your appointment, please contact [facility]CER CTR [place] - A DEPT OF [facility] Bridegroom [facility] [phone]  and follow the prompts.  Office hours are 8:00 a.m. to 4:30 p.m. Monday - Friday. Please note that voicemails left after 4:00 p.m. may not be returned until the following business day.  We are closed weekends and major holidays. You have access to a nurse at all times for urgent questions. Please call the main number to the clinic [phone] and follow the prompts.  For any non-urgent questions, you may also contact your provider using MyChart. We now offer e-Visits for anyone 82 and older to request care online for non-urgent symptoms. For details visit [email or web address].   Also download the MyChart app! Go to the app store, search "MyChart", open the app, select [place], and log in with your MyChart username and password.

## 2024-06-09 NOTE — Progress Notes (Signed)
 Port flushed with good blood return noted. No bruising or swelling at site. Bandaid applied and patient discharged in satisfactory condition. VVS stable with no signs or symptoms of distressed noted.

## 2024-06-12 ENCOUNTER — Ambulatory Visit: Payer: Self-pay | Admitting: Physician Assistant

## 2024-06-12 DIAGNOSIS — D500 Iron deficiency anemia secondary to blood loss (chronic): Secondary | ICD-10-CM

## 2024-06-12 NOTE — Progress Notes (Signed)
 NURSES:  Her hemoglobin has not improved as much as I hoped that it would have after her most recent Monoferric  (05/16/2024).  Please get her set up for repeat labs (via port) in 2 weeks.  I have entered orders for CBC/D, ferritin, iron /TIBC, if you can link/list orders in comment box.  Thanks!

## 2024-06-13 NOTE — Progress Notes (Signed)
 Patient notified and verbalized understanding. Appointment made.

## 2024-06-14 DIAGNOSIS — M2550 Pain in unspecified joint: Secondary | ICD-10-CM | POA: Diagnosis not present

## 2024-06-14 DIAGNOSIS — D509 Iron deficiency anemia, unspecified: Secondary | ICD-10-CM | POA: Diagnosis not present

## 2024-06-14 DIAGNOSIS — M349 Systemic sclerosis, unspecified: Secondary | ICD-10-CM | POA: Diagnosis not present

## 2024-06-14 DIAGNOSIS — K5520 Angiodysplasia of colon without hemorrhage: Secondary | ICD-10-CM | POA: Diagnosis not present

## 2024-06-14 DIAGNOSIS — K219 Gastro-esophageal reflux disease without esophagitis: Secondary | ICD-10-CM | POA: Diagnosis not present

## 2024-06-14 DIAGNOSIS — I7300 Raynaud's syndrome without gangrene: Secondary | ICD-10-CM | POA: Diagnosis not present

## 2024-06-21 DIAGNOSIS — C44329 Squamous cell carcinoma of skin of other parts of face: Secondary | ICD-10-CM | POA: Diagnosis not present

## 2024-06-24 ENCOUNTER — Ambulatory Visit: Payer: Medicare Other

## 2024-06-24 DIAGNOSIS — I5032 Chronic diastolic (congestive) heart failure: Secondary | ICD-10-CM | POA: Diagnosis not present

## 2024-06-25 LAB — CUP PACEART REMOTE DEVICE CHECK
Battery Remaining Longevity: 108 mo
Battery Remaining Percentage: 84 %
Battery Voltage: 3.01 V
Brady Statistic AP VP Percent: 1 %
Brady Statistic AP VS Percent: 3.4 %
Brady Statistic AS VP Percent: 1.6 %
Brady Statistic AS VS Percent: 95 %
Brady Statistic RA Percent Paced: 3.2 %
Brady Statistic RV Percent Paced: 1.7 %
Date Time Interrogation Session: 20251219040016
Implantable Lead Connection Status: 753985
Implantable Lead Connection Status: 753985
Implantable Lead Implant Date: 20231121
Implantable Lead Implant Date: 20231121
Implantable Lead Location: 753859
Implantable Lead Location: 753860
Implantable Pulse Generator Implant Date: 20231121
Lead Channel Impedance Value: 410 Ohm
Lead Channel Impedance Value: 430 Ohm
Lead Channel Pacing Threshold Amplitude: 0.625 V
Lead Channel Pacing Threshold Amplitude: 0.875 V
Lead Channel Pacing Threshold Pulse Width: 0.4 ms
Lead Channel Pacing Threshold Pulse Width: 0.5 ms
Lead Channel Sensing Intrinsic Amplitude: 1.5 mV
Lead Channel Sensing Intrinsic Amplitude: 4.6 mV
Lead Channel Setting Pacing Amplitude: 1.125
Lead Channel Setting Pacing Amplitude: 1.625
Lead Channel Setting Pacing Pulse Width: 0.5 ms
Lead Channel Setting Sensing Sensitivity: 2 mV
Pulse Gen Model: 2272
Pulse Gen Serial Number: 6860174

## 2024-06-27 ENCOUNTER — Inpatient Hospital Stay

## 2024-06-27 DIAGNOSIS — D509 Iron deficiency anemia, unspecified: Secondary | ICD-10-CM | POA: Diagnosis not present

## 2024-06-27 DIAGNOSIS — D500 Iron deficiency anemia secondary to blood loss (chronic): Secondary | ICD-10-CM

## 2024-06-27 LAB — CBC WITH DIFFERENTIAL/PLATELET
Abs Immature Granulocytes: 0.01 K/uL (ref 0.00–0.07)
Basophils Absolute: 0 K/uL (ref 0.0–0.1)
Basophils Relative: 1 %
Eosinophils Absolute: 0.1 K/uL (ref 0.0–0.5)
Eosinophils Relative: 2 %
HCT: 34.6 % — ABNORMAL LOW (ref 36.0–46.0)
Hemoglobin: 11.2 g/dL — ABNORMAL LOW (ref 12.0–15.0)
Immature Granulocytes: 0 %
Lymphocytes Relative: 15 %
Lymphs Abs: 0.8 K/uL (ref 0.7–4.0)
MCH: 30.9 pg (ref 26.0–34.0)
MCHC: 32.4 g/dL (ref 30.0–36.0)
MCV: 95.3 fL (ref 80.0–100.0)
Monocytes Absolute: 0.4 K/uL (ref 0.1–1.0)
Monocytes Relative: 7 %
Neutro Abs: 4.2 K/uL (ref 1.7–7.7)
Neutrophils Relative %: 75 %
Platelets: 223 K/uL (ref 150–400)
RBC: 3.63 MIL/uL — ABNORMAL LOW (ref 3.87–5.11)
RDW: 14.3 % (ref 11.5–15.5)
WBC: 5.5 K/uL (ref 4.0–10.5)
nRBC: 0 % (ref 0.0–0.2)

## 2024-06-27 LAB — FERRITIN: Ferritin: 115 ng/mL (ref 11–307)

## 2024-06-27 LAB — IRON AND TIBC
Iron: 51 ug/dL (ref 28–170)
Saturation Ratios: 18 % (ref 10.4–31.8)
TIBC: 284 ug/dL (ref 250–450)
UIBC: 233 ug/dL

## 2024-06-27 NOTE — Progress Notes (Signed)
 Patients port flushed without difficulty.  Good blood return noted with no bruising or swelling noted at site.  Band aid applied.  Labs drawn per orders. VSS with discharge and left in satisfactory condition with no s/s of distress noted. All follow ups as scheduled.       Aimy Sweeting [employer]

## 2024-06-27 NOTE — Progress Notes (Signed)
 Remote PPM Transmission

## 2024-06-28 ENCOUNTER — Ambulatory Visit: Payer: Self-pay | Admitting: Physician Assistant

## 2024-06-28 NOTE — Progress Notes (Signed)
 NURSES: Please call patient to let her know that her blood count looks much better than it did 2 weeks ago.  Her iron  levels are adequate.  She does not need any IV iron  at this time.  We will continue with regular labs as scheduled.

## 2024-06-29 NOTE — Progress Notes (Signed)
 Patient made aware of results and recommendations.  Verbalized understanding.

## 2024-07-04 ENCOUNTER — Encounter: Payer: Self-pay | Admitting: *Deleted

## 2024-07-06 ENCOUNTER — Ambulatory Visit: Payer: Self-pay | Admitting: Cardiovascular Disease

## 2024-07-08 ENCOUNTER — Ambulatory Visit: Payer: Self-pay

## 2024-07-08 NOTE — Telephone Encounter (Signed)
 FYI Only or Action Required?: Action required by provider: request for appointment.  Patient was last seen in primary care on 04/21/2024 by Gladis Mustard, FNP.  Called Nurse Triage reporting Fall.  Symptoms began a week ago.  Interventions attempted: Nothing.  Symptoms are: unchanged. Fell at Christmas and hurt right ribs. No availability today, declines VV. Will get checked out Monday.  Triage Disposition: See PCP When Office is Open (Within 3 Days)  Patient/caregiver understands and will follow disposition?: YesCopied from CRM [id number]. Topic: Clinical - Red Word Triage >> Jul 08, 2024  9:54 AM Susanna ORN wrote: Red Word that prompted transfer to Nurse Triage: Patient fell a few days ago. States pain in her ribs on the right side is getting worse. Wants to see about getting x-rays done at [facility]. Reason for Disposition  MILD weakness (e.g., does not interfere with ability to work, go to school, normal activities)  (Exception: Mild weakness is a chronic symptom.)  Answer Assessment - Initial Assessment Questions 1. MECHANISM: How did the fall happen?     Slipped 2. DOMESTIC VIOLENCE AND ELDER ABUSE SCREENING: Did you fall because someone pushed you or tried to hurt you? If Yes, ask: Are you safe now?     no 3. ONSET: When did the fall happen? (e.g., minutes, hours, or days ago)     Christmas 4. LOCATION: What part of the body hit the ground? (e.g., back, buttocks, head, hips, knees, hands, head, stomach)     side 5. INJURY: Did you hurt (injure) yourself when you fell? If Yes, ask: What did you injure? Tell me more about this? (e.g., body area; type of injury; pain severity)     Right ribs 6. PAIN: Is there any pain? If Yes, ask: How bad is the pain? (e.g., Scale 0-10; or none, mild,      severe 7. SIZE: For cuts, bruises, or swelling, ask: How large is it? (e.g., inches or centimeters)      N/a 8. PREGNANCY: Is there any chance you are pregnant?  When was your last menstrual period?     no 9. OTHER SYMPTOMS: Do you have any other symptoms? (e.g., dizziness, fever, weakness; new-onset or worsening).      dizzy 10. CAUSE: What do you think caused the fall (or falling)? (e.g., dizzy spell, tripped)       tripped  Protocols used: Falls and [facility]

## 2024-07-11 ENCOUNTER — Inpatient Hospital Stay: Attending: Hematology

## 2024-07-11 ENCOUNTER — Other Ambulatory Visit: Payer: Self-pay

## 2024-07-11 ENCOUNTER — Emergency Department (HOSPITAL_COMMUNITY)
Admission: EM | Admit: 2024-07-11 | Discharge: 2024-07-11 | Disposition: A | Discharge status: Home / Self Care | Attending: Emergency Medicine | Admitting: Emergency Medicine

## 2024-07-11 ENCOUNTER — Emergency Department (HOSPITAL_COMMUNITY)

## 2024-07-11 ENCOUNTER — Encounter (HOSPITAL_COMMUNITY): Payer: Self-pay

## 2024-07-11 DIAGNOSIS — D509 Iron deficiency anemia, unspecified: Secondary | ICD-10-CM | POA: Insufficient documentation

## 2024-07-11 DIAGNOSIS — W182XXA Fall in (into) shower or empty bathtub, initial encounter: Secondary | ICD-10-CM | POA: Insufficient documentation

## 2024-07-11 DIAGNOSIS — M25551 Pain in right hip: Secondary | ICD-10-CM | POA: Insufficient documentation

## 2024-07-11 DIAGNOSIS — W19XXXA Unspecified fall, initial encounter: Secondary | ICD-10-CM

## 2024-07-11 DIAGNOSIS — Z7982 Long term (current) use of aspirin: Secondary | ICD-10-CM | POA: Insufficient documentation

## 2024-07-11 DIAGNOSIS — Z950 Presence of cardiac pacemaker: Secondary | ICD-10-CM | POA: Insufficient documentation

## 2024-07-11 DIAGNOSIS — D500 Iron deficiency anemia secondary to blood loss (chronic): Secondary | ICD-10-CM

## 2024-07-11 DIAGNOSIS — S2231XA Fracture of one rib, right side, initial encounter for closed fracture: Secondary | ICD-10-CM | POA: Insufficient documentation

## 2024-07-11 LAB — CBC
HCT: 32.4 % — ABNORMAL LOW (ref 36.0–46.0)
Hemoglobin: 11.5 g/dL — ABNORMAL LOW (ref 12.0–15.0)
MCH: 35.3 pg — ABNORMAL HIGH (ref 26.0–34.0)
MCHC: 35.5 g/dL (ref 30.0–36.0)
MCV: 99.4 fL (ref 80.0–100.0)
Platelets: 235 K/uL (ref 150–400)
RBC: 3.26 MIL/uL — ABNORMAL LOW (ref 3.87–5.11)
RDW: 15.5 % (ref 11.5–15.5)
WBC: 6 K/uL (ref 4.0–10.5)
nRBC: 0 % (ref 0.0–0.2)

## 2024-07-11 LAB — SAMPLE TO BLOOD BANK

## 2024-07-11 MED ORDER — HYDROCODONE-ACETAMINOPHEN 5-325 MG PO TABS: 1.0000 | ORAL_TABLET | Freq: Four times a day (QID) | ORAL | 0 refills | Status: AC | PRN

## 2024-07-11 MED ORDER — HEPARIN SOD (PORK) LOCK FLUSH 100 UNIT/ML IV SOLN
500.0000 [IU] | Freq: Once | INTRAVENOUS | Status: AC
  Administered 2024-07-11: 500 [IU]
  Filled 2024-07-11: qty 5

## 2024-07-11 NOTE — ED Provider Notes (Signed)
 " [place] EMERGENCY DEPARTMENT AT [facility] Provider Note   CSN: [id number] Arrival date & time: 07/11/24  1454     Patient presents with: Fall, Hip Pain, and Ribcage pain   Haley Trevino is a 84 y.o. female.    Fall  Hip Pain  Patient presents with pain on right ribs and right hip after fall.  Fall was reportedly prior to Christmas.  Fell into the bathtub.  Pain continues.  Still able to ambulate.  Does have some pain with breathing.  Does not feel short of breath.  History of multiple iron  infusions for anemia.     Prior to Admission medications  Medication Sig Start Date End Date Taking? Authorizing Provider  HYDROcodone -acetaminophen  (NORCO/VICODIN) 5-325 MG tablet Take 1 tablet by mouth every 6 (six) hours as needed for moderate pain (pain score 4-6). 07/11/24  Yes Patsey Lot, MD  acetaminophen  (TYLENOL ) 500 MG tablet Take 1,000 mg by mouth at bedtime.    [provider]  aspirin  EC 81 MG tablet Take 81 mg by mouth daily. Swallow whole.    [provider]  clonazePAM  (KLONOPIN ) 0.5 MG tablet Take 1 tablet (0.5 mg total) by mouth 2 (two) times daily as needed for anxiety. 02/08/24   Gladis Mary-Margaret, FNP  ezetimibe  (ZETIA ) 10 MG tablet Take 1 tablet (10 mg total) by mouth daily. 02/08/24   Gladis, Mary-Margaret, FNP  FLUoxetine  (PROZAC ) 40 MG capsule Take 1 capsule (40 mg total) by mouth daily. 02/08/24   Gladis Mustard, FNP  fluticasone  (FLONASE ) 50 MCG/ACT nasal spray PLACE 2 SPRAYS INTO BOTH NOSTRILS DAILY 04/21/24   Gladis Mustard, FNP  lidocaine -prilocaine  (EMLA ) cream Apply 1 Application topically as needed. 02/19/24   Davonna Siad, MD  NIFEdipine  (PROCARDIA  XL/NIFEDICAL XL) 60 MG 24 hr tablet Take 1 tablet (60 mg total) by mouth daily. 02/08/24   Gladis Mustard, FNP  omeprazole  (PRILOSEC) 20 MG capsule TAKE 1 TABLET DAILY 02/08/24   Gladis Mustard, FNP  RESTASIS  0.05 % ophthalmic emulsion Place 1 drop into  both eyes 2 (two) times daily. 03/26/22   [provider]  sildenafil  (REVATIO ) 20 MG tablet Take 1 tablet (20 mg total) by mouth 2 (two) times daily. 02/08/24   Gladis Mustard, FNP  SYNTHROID  75 MCG tablet TAKE (1) TABLET DAILY BE- FORE BREAKFAST. 02/08/24   Gladis, Mary-Margaret, FNP    Allergies: Bactrim  [sulfamethoxazole -trimethoprim ], Codeine, Dilaudid [hydromorphone hcl], Eliquis  [apixaban ], Acyclovir and related, Crestor  [rosuvastatin  calcium ], Gabapentin, Naproxen, Aleve [naproxen sodium], and Lyrica [pregabalin]    Review of Systems  Updated Vital Signs BP (!) 162/91 (BP Location: Left Arm)   Pulse 70   Temp 98.7 F (37.1 C) (Oral)   Resp 18   Ht 5' 2 (1.575 m)   Wt 46.3 kg   SpO2 100%   BMI 18.66 kg/m   Physical Exam Vitals and nursing note reviewed.  Cardiovascular:     Rate and Rhythm: Regular rhythm.  Pulmonary:     Comments: Tenderness to right lateral lower chest wall.  No crepitus or deformity.  Equal breath sounds. Chest:     Chest wall: Tenderness present.  Abdominal:     Tenderness: There is no abdominal tenderness.  Musculoskeletal:        General: Tenderness present.     Comments:   Some tenderness to right hip laterally.  However good range of motion.  Neurological:     Mental Status: She is alert.     (all labs ordered are listed,  but only abnormal results are displayed) Labs Reviewed - No data to display  EKG: EKG Interpretation Date/Time:  Monday July 11 2024 15:25:54 EST Ventricular Rate:  66 PR Interval:  176 QRS Duration:  80 QT Interval:  418 QTC Calculation: 438 R Axis:   -35  Text Interpretation: Sinus rhythm with Premature atrial complexes Left axis deviation Abnormal ECG When compared with ECG of 24-Nov-2023 04:09, QT has shortened Confirmed by Patsey Lot [phone]) on 07/11/2024 3:45:17 PM  Radiology: ARCOLA Hip Unilat  With Pelvis 2-3 Views Right Result Date: 07/11/2024 EXAM: 2 OR MORE VIEW(S) XRAY OF THE  UNILATERAL HIP 07/11/2024 04:00:13 PM COMPARISON: 05/19/2023 CLINICAL HISTORY: fall and pain FINDINGS: BONES AND JOINTS: Unchanged right hip compression screw fixation in place. Bilateral sacroplasties noted. Old fracture deformity of the left inferior pubic ramus. No acute fracture. No malalignment. LUMBAR SPINE: Degenerative changes of the lower lumbar spine. SOFT TISSUES: The soft tissues are unremarkable. IMPRESSION: 1. No acute osseous abnormality. 2. Unchanged right hip compression screw fixation. 3. Old fracture deformity of the left inferior pubic ramus. Electronically signed by: Norleen Boxer MD 07/11/2024 04:39 PM EST RP Workstation: [id number]   DG Ribs Unilateral W/Chest Right Result Date: 07/11/2024 EXAM: 1 VIEW(S) XRAY OF THE RIGHT RIBS AND CHEST 07/11/2024 04:00:13 PM COMPARISON: None available. CLINICAL HISTORY: fall and pain FINDINGS: BONES: Acute nondisplaced fracture of right anterolateral eighth rib. Multilevel thoracic vertebral augmentation. Lumbar dextrocurvature. LUNGS AND PLEURA: No consolidation or pulmonary edema. No pleural effusion or pneumothorax. HEART AND MEDIASTINUM: Left chest pacemaker with leads in right atrium and right ventricle. Status post TAVR. Aortic atherosclerosis. No acute abnormality of the cardiac and mediastinal silhouettes. LINES AND TUBES: Right IJ Port-A-Cath in place with tip at superior cavoatrial junction. Left chest pacemaker with leads in right atrium and right ventricle. JOINTS: Degenerative changes in left shoulder. IMPRESSION: 1. Acute nondisplaced fracture of right anterolateral eighth rib. Electronically signed by: Greig Pique MD 07/11/2024 04:38 PM EST RP Workstation: [id number]     Procedures   Medications Ordered in the ED - No data to display                                  Medical Decision Making Amount and/or Complexity of Data Reviewed Radiology: ordered.  Risk Prescription drug management.   Patient with fall.  Felt  lightheaded and Christmas.  Reviewed notes and has had her pacemaker interrogated since then.  No severe changes found although did have some tachycardia which she has had previously.  Hemoglobin stable.  Will start with rib x-ray and right hip x-ray.  Hip x-ray reassuring.  However right rib films does show 1/8 rib fracture.  I think this is likely cause of the pain.  Fortunately it has been almost 2 weeks since the fall.  Do not think we need further imaging at this time.  Hemoglobins been stable.  Will treat symptomatically.  Follow-up with PCP.     Final diagnoses:  Fall, initial encounter  Closed fracture of one rib of right side, initial encounter    ED Discharge Orders          Ordered    HYDROcodone -acetaminophen  (NORCO/VICODIN) 5-325 MG tablet  Every 6 hours PRN        07/11/24 1717               Patsey Lot, MD 07/11/24 1720  "

## 2024-07-11 NOTE — ED Triage Notes (Addendum)
 Pt c/o R ribcage pain and R hip pain following a fall prior to Christmas.  Pain score 8/10.  Pt reports she blacked out and fell into the bathtub.  Pt reports the rib cage pain is getting worse.  Pt is seen by [facility] for anemia.      Pt able to easily speak full sentences.  Pt had blood drawn at [facility] this morning.

## 2024-07-11 NOTE — ED Notes (Signed)
 Pt verbalized understanding of discharge instructions. Opportunity for questions provided.

## 2024-07-11 NOTE — ED Notes (Signed)
 Pt had been brought over from [facility] w/ port accessed.  Pt given heparin  flush and port deaccessed.

## 2024-07-11 NOTE — Progress Notes (Signed)
 Patient's port flushed without difficulty.  Good blood return noted with no bruising or swelling noted at site. Labs drawn per orders. Port left accessed due to patient wanting to be seen in the ER for further evaluation post fall. Per pt she fell last week in the bathroom and her ribs are still bothering her. VSS with discharge and left in satisfactory condition with no s/s of distress noted. All follow ups as scheduled.       Haley Trevino

## 2024-07-16 ENCOUNTER — Emergency Department (HOSPITAL_COMMUNITY)
Admission: EM | Admit: 2024-07-16 | Discharge: 2024-07-16 | Disposition: A | Discharge status: Home / Self Care | Attending: Emergency Medicine | Admitting: Emergency Medicine

## 2024-07-16 ENCOUNTER — Encounter (HOSPITAL_COMMUNITY): Payer: Self-pay

## 2024-07-16 ENCOUNTER — Emergency Department (HOSPITAL_COMMUNITY)

## 2024-07-16 ENCOUNTER — Other Ambulatory Visit: Payer: Self-pay

## 2024-07-16 DIAGNOSIS — E039 Hypothyroidism, unspecified: Secondary | ICD-10-CM | POA: Insufficient documentation

## 2024-07-16 DIAGNOSIS — R55 Syncope and collapse: Secondary | ICD-10-CM | POA: Diagnosis not present

## 2024-07-16 DIAGNOSIS — Z950 Presence of cardiac pacemaker: Secondary | ICD-10-CM | POA: Diagnosis not present

## 2024-07-16 DIAGNOSIS — S299XXA Unspecified injury of thorax, initial encounter: Secondary | ICD-10-CM | POA: Diagnosis present

## 2024-07-16 DIAGNOSIS — Z79899 Other long term (current) drug therapy: Secondary | ICD-10-CM | POA: Diagnosis not present

## 2024-07-16 DIAGNOSIS — S2241XA Multiple fractures of ribs, right side, initial encounter for closed fracture: Secondary | ICD-10-CM | POA: Diagnosis not present

## 2024-07-16 DIAGNOSIS — E860 Dehydration: Secondary | ICD-10-CM | POA: Diagnosis not present

## 2024-07-16 DIAGNOSIS — I10 Essential (primary) hypertension: Secondary | ICD-10-CM | POA: Diagnosis not present

## 2024-07-16 DIAGNOSIS — Z7982 Long term (current) use of aspirin: Secondary | ICD-10-CM | POA: Diagnosis not present

## 2024-07-16 DIAGNOSIS — W19XXXA Unspecified fall, initial encounter: Secondary | ICD-10-CM | POA: Insufficient documentation

## 2024-07-16 LAB — CBC WITH DIFFERENTIAL/PLATELET
Abs Immature Granulocytes: 0.01 K/uL (ref 0.00–0.07)
Basophils Absolute: 0 K/uL (ref 0.0–0.1)
Basophils Relative: 1 %
Eosinophils Absolute: 0 K/uL (ref 0.0–0.5)
Eosinophils Relative: 1 %
HCT: 34.4 % — ABNORMAL LOW (ref 36.0–46.0)
Hemoglobin: 11.7 g/dL — ABNORMAL LOW (ref 12.0–15.0)
Immature Granulocytes: 0 %
Lymphocytes Relative: 16 %
Lymphs Abs: 0.9 K/uL (ref 0.7–4.0)
MCH: 32.6 pg (ref 26.0–34.0)
MCHC: 34 g/dL (ref 30.0–36.0)
MCV: 95.8 fL (ref 80.0–100.0)
Monocytes Absolute: 0.4 K/uL (ref 0.1–1.0)
Monocytes Relative: 7 %
Neutro Abs: 4.3 K/uL (ref 1.7–7.7)
Neutrophils Relative %: 75 %
Platelets: 226 K/uL (ref 150–400)
RBC: 3.59 MIL/uL — ABNORMAL LOW (ref 3.87–5.11)
RDW: 14.4 % (ref 11.5–15.5)
WBC: 5.7 K/uL (ref 4.0–10.5)
nRBC: 0 % (ref 0.0–0.2)

## 2024-07-16 LAB — URINALYSIS, ROUTINE W REFLEX MICROSCOPIC
Bilirubin Urine: NEGATIVE
Glucose, UA: NEGATIVE mg/dL
Hgb urine dipstick: NEGATIVE
Ketones, ur: NEGATIVE mg/dL
Leukocytes,Ua: NEGATIVE
Nitrite: NEGATIVE
Protein, ur: NEGATIVE mg/dL
Specific Gravity, Urine: 1.01 (ref 1.005–1.030)
pH: 6 (ref 5.0–8.0)

## 2024-07-16 LAB — COMPREHENSIVE METABOLIC PANEL WITH GFR
ALT: 5 U/L (ref 0–44)
AST: 17 U/L (ref 15–41)
Albumin: 3.9 g/dL (ref 3.5–5.0)
Alkaline Phosphatase: 70 U/L (ref 38–126)
Anion gap: 14 (ref 5–15)
BUN: 10 mg/dL (ref 8–23)
CO2: 19 mmol/L — ABNORMAL LOW (ref 22–32)
Calcium: 8.3 mg/dL — ABNORMAL LOW (ref 8.9–10.3)
Chloride: 105 mmol/L (ref 98–111)
Creatinine, Ser: 0.87 mg/dL (ref 0.44–1.00)
GFR, Estimated: >60 mL/min
Glucose, Bld: 95 mg/dL (ref 70–99)
Potassium: 4.2 mmol/L (ref 3.5–5.1)
Sodium: 138 mmol/L (ref 135–145)
Total Bilirubin: <0.2 mg/dL (ref 0.0–1.2)
Total Protein: 6.3 g/dL — ABNORMAL LOW (ref 6.5–8.1)

## 2024-07-16 LAB — CBG MONITORING, ED: Glucose-Capillary: 100 mg/dL — ABNORMAL HIGH (ref 70–99)

## 2024-07-16 MED ORDER — HEPARIN SOD (PORK) LOCK FLUSH 100 UNIT/ML IV SOLN
500.0000 [IU] | Freq: Once | INTRAVENOUS | Status: AC
  Administered 2024-07-16: 500 [IU]
  Filled 2024-07-16: qty 5

## 2024-07-16 MED ORDER — SODIUM CHLORIDE 0.9 % IV BOLUS
1000.0000 mL | Freq: Once | INTRAVENOUS | Status: AC
  Administered 2024-07-16: 1000 mL via INTRAVENOUS

## 2024-07-16 NOTE — Discharge Instructions (Addendum)
 Please return to the ER for severe worsening symptoms, you will likely need to see your cardiologist for follow-up of the pacemaker within the next 2 weeks, I did discuss this with the cardiologist, they said that everything looked okay for now.  If you develop palpitations chest pain shortness of breath or recurrent passing out spells return to the ER immediately  The x-ray did show a couple of rib fractures, these are probably old and not from today

## 2024-07-16 NOTE — ED Triage Notes (Addendum)
 Pt comes in for syncope episode. Pt has fallen multiple times in the past couple of days.    EMS noticed changed in the EKG. HR would increase to 100 then drop to 50 for a while.   Today the pt turned to look at her husband and the pt LOC. LOC was very brief.   Pt has had diarrhea for the past 2 days.    Pt has a pacemaker and port. A&Ox4.   Long medical hx.  Pt takes Viagra .

## 2024-07-16 NOTE — ED Notes (Signed)
 Pt did have a 20G right wrist placed by EMS. This nurse checked the IV and it was infiltrated. IV was removed by this nurse.

## 2024-07-16 NOTE — ED Provider Notes (Signed)
 At change of shift care signed out from outgoing physician, patient has been seen evaluated and stabilized, given IV fluids, I discussed her care with Dr. Admission of the cardiology service, the patient has no acute findings on exam, improved, given IV fluids and cardiology states that the pacemaker interrogation is stable and safe for discharge.  Patient will be discharged   Haley Rogue, MD 07/16/24 [phone]

## 2024-07-16 NOTE — ED Notes (Signed)
 Pt/family received d/c paperwork at this time. After going over the paperwork any questions, comments, or concerns were answered to the best of this nurse's knowledge. The pt/family verbally acknowledged the teachings/instructions.

## 2024-07-16 NOTE — ED Provider Notes (Signed)
 " [place] EMERGENCY DEPARTMENT AT [facility] Provider Note   CSN: [id number] Arrival date & time: 07/16/24  1207     Patient presents with: Loss of Consciousness   Haley Trevino is a 84 y.o. female.  {Add pertinent medical, surgical, social history, OB history to HPI:32947} Pt is a 84 yo female with pmhx significant for scleroderma, hypothyroidism, raynaud's disease, htn, gerd, hld, IBS, AVM with anemia, CHB s/p pacemaker placement, afib/flutter (no DOACs due to AVMs) and depression.  Pt has fallen several times lately.  She was here on 1/5 for a fall and was dx'd with a fx rib on the right.  She has had diarrhea for 2 days and fell again last night.  She re-injured her rib.  She had another episode today where she passed out.  She denies new injury from today.  Pt said she has not eaten or drank much in the last 2 days.       Prior to Admission medications  Medication Sig Start Date End Date Taking? Authorizing Provider  acetaminophen  (TYLENOL ) 500 MG tablet Take 1,000 mg by mouth at bedtime.    [provider]  aspirin  EC 81 MG tablet Take 81 mg by mouth daily. Swallow whole.    [provider]  clonazePAM  (KLONOPIN ) 0.5 MG tablet Take 1 tablet (0.5 mg total) by mouth 2 (two) times daily as needed for anxiety. 02/08/24   Gladis Mary-Margaret, FNP  ezetimibe  (ZETIA ) 10 MG tablet Take 1 tablet (10 mg total) by mouth daily. 02/08/24   Gladis Mary-Margaret, FNP  FLUoxetine  (PROZAC ) 40 MG capsule Take 1 capsule (40 mg total) by mouth daily. 02/08/24   Gladis Mustard, FNP  fluticasone  (FLONASE ) 50 MCG/ACT nasal spray PLACE 2 SPRAYS INTO BOTH NOSTRILS DAILY 04/21/24   Gladis Mustard, FNP  HYDROcodone -acetaminophen  (NORCO/VICODIN) 5-325 MG tablet Take 1 tablet by mouth every 6 (six) hours as needed for moderate pain (pain score 4-6). 07/11/24   Patsey Lot, MD  lidocaine -prilocaine  (EMLA ) cream Apply 1 Application topically as needed. 02/19/24    Davonna Siad, MD  NIFEdipine  (PROCARDIA  XL/NIFEDICAL XL) 60 MG 24 hr tablet Take 1 tablet (60 mg total) by mouth daily. 02/08/24   Gladis Mustard, FNP  omeprazole  (PRILOSEC) 20 MG capsule TAKE 1 TABLET DAILY 02/08/24   Gladis, Mary-Margaret, FNP  RESTASIS  0.05 % ophthalmic emulsion Place 1 drop into both eyes 2 (two) times daily. 03/26/22   [provider]  sildenafil  (REVATIO ) 20 MG tablet Take 1 tablet (20 mg total) by mouth 2 (two) times daily. 02/08/24   Gladis Mustard, FNP  SYNTHROID  75 MCG tablet TAKE (1) TABLET DAILY BE- FORE BREAKFAST. 02/08/24   Gladis, Mary-Margaret, FNP    Allergies: Bactrim  [sulfamethoxazole -trimethoprim ], Codeine, Dilaudid [hydromorphone hcl], Eliquis  [apixaban ], Acyclovir and related, Crestor  [rosuvastatin  calcium ], Gabapentin, Naproxen, Aleve [naproxen sodium], and Lyrica [pregabalin]    Review of Systems  Gastrointestinal:  Positive for diarrhea.  Neurological:  Positive for syncope.  All other systems reviewed and are negative.   Updated Vital Signs BP (!) 159/65   Pulse 63   Temp 98.8 F (37.1 C) (Oral)   Resp 14   Ht 5' 2 (1.575 m)   Wt 46.3 kg   SpO2 96%   BMI 18.66 kg/m   Physical Exam Vitals and nursing note reviewed.  Constitutional:      Appearance: Normal appearance.  HENT:     Head: Normocephalic and atraumatic.     Right Ear: External ear normal.  Left Ear: External ear normal.     Nose: Nose normal.     Mouth/Throat:     Mouth: Mucous membranes are dry.  Eyes:     Extraocular Movements: Extraocular movements intact.     Conjunctiva/sclera: Conjunctivae normal.     Pupils: Pupils are equal, round, and reactive to light.  Cardiovascular:     Rate and Rhythm: Normal rate and regular rhythm.     Pulses: Normal pulses.     Heart sounds: Normal heart sounds.  Pulmonary:     Effort: Pulmonary effort is normal.     Breath sounds: Normal breath sounds.  Chest:    Musculoskeletal:        General: Normal  range of motion.     Cervical back: Normal range of motion and neck supple.  Skin:    General: Skin is warm.     Capillary Refill: Capillary refill takes less than 2 seconds.  Neurological:     General: No focal deficit present.     Mental Status: She is alert and oriented to person, place, and time.     (all labs ordered are listed, but only abnormal results are displayed) Labs Reviewed  COMPREHENSIVE METABOLIC PANEL WITH GFR - Abnormal; Notable for the following components:      Result Value   CO2 19 (*)    Calcium  8.3 (*)    Total Protein 6.3 (*)    All other components within normal limits  CBC WITH DIFFERENTIAL/PLATELET - Abnormal; Notable for the following components:   RBC 3.59 (*)    Hemoglobin 11.7 (*)    HCT 34.4 (*)    All other components within normal limits  CBG MONITORING, ED - Abnormal; Notable for the following components:   Glucose-Capillary 100 (*)    All other components within normal limits  URINALYSIS, ROUTINE W REFLEX MICROSCOPIC    EKG: EKG Interpretation Date/Time:  Saturday July 16 2024 12:27:07 EST Ventricular Rate:  65 PR Interval:  175 QRS Duration:  87 QT Interval:  444 QTC Calculation: 462 R Axis:   -26  Text Interpretation: Sinus rhythm Borderline left axis deviation No significant change since last tracing Confirmed by Dean Clarity [phone]) on 07/16/2024 1:26:10 PM  Radiology: [facility]COLA Ribs Unilateral W/Chest Right Result Date: 07/16/2024 CLINICAL DATA:  Syncope, falls, EKG changes. EXAM: RIGHT RIBS AND CHEST - 3+ VIEW COMPARISON:  07/11/2024. FINDINGS: Frontal view of the chest shows midline trachea. Heart is at the upper limits of normal in size to mildly enlarged. Thoracic aorta is calcified. Aortic valve replacement. Right IJ Port-A-Cath is in the low SVC. Pacemaker lead tips are in the right atrium and right ventricle. Minimal left apical pleural thickening. Lungs are clear. No pneumothorax. No pleural fluid. Dedicated views of the right  ribs show an acute right seventh anterolateral rib fracture. There may be a nondisplaced fracture of the right anterolateral eighth rib fracture as well. Midthoracic vertebra tuber body augmentations. IMPRESSION: 1. Acute right seventh and eighth anterolateral rib fractures. 2. Otherwise, no acute findings. Electronically Signed   By: Newell Eke M.D.   On: 07/16/2024 13:24    {Document cardiac monitor, telemetry assessment procedure when appropriate:32947} Procedures   Medications Ordered in the ED  sodium chloride  0.9 % bolus 1,000 mL (1,000 mLs Intravenous New Bag/Given 07/16/24 1329)      {Click here for ABCD2, HEART and other calculators REFRESH Note before signing:1}  Medical Decision Making Amount and/or Complexity of Data Reviewed Labs: ordered. Radiology: ordered.   This patient presents to the ED for concern of syncope, this involves an extensive number of treatment options, and is a complaint that carries with it a high risk of complications and morbidity.  The differential diagnosis includes dehydration, anemia, electrolyte abn, arrhythmia   Co morbidities that complicate the patient evaluation   scleroderma, hypothyroidism, raynaud's disease, htn, gerd, hld, IBS, AVM with anemia, CHB s/p pacemaker placement, afib/flutter (no DOACs due to AVMs) and depression   Additional history obtained:  Additional history obtained from epic chart review External records from outside source obtained and reviewed including EMS report   Lab Tests:  I Ordered, and personally interpreted labs.  The pertinent results include:  cbc with hgb 11.7 (stable); cmp stable; ua neg   Imaging Studies ordered:  I ordered imaging studies including r ribs  I independently visualized and interpreted imaging which showed  Acute right seventh and eighth anterolateral rib fractures.  2. Otherwise, no acute findings.   I agree with the radiologist  interpretation   Cardiac Monitoring:  The patient was maintained on a cardiac monitor.  I personally viewed and interpreted the cardiac monitored which showed an underlying rhythm of: nsr   Medicines ordered and prescription drug management:  I ordered medication including ivfs  for sx  Reevaluation of the patient after these medicines showed that the patient improved I have reviewed the patients home medicines and have made adjustments as needed   Test Considered:  ct   Critical Interventions:  ivfs   Consultations Obtained:  I requested consultation with the ***,  and discussed lab and imaging findings as well as pertinent plan - they recommend: ***   Problem List / ED Course:  Syncope:  pacemaker interrogated and pt did have 1 episode of HR going up to 175 (alert said it was pacemaker mediated tachycardia).  Pt is also dehydrated.  She is feeling better after fluids.     Reevaluation:  After the interventions noted above, I reevaluated the patient and found that they have :improved   Social Determinants of Health:  Lives at home   Dispostion:  After consideration of the diagnostic results and the patients response to treatment, I feel that the patent would benefit from ***.    {Document critical care time when appropriate  Document review of labs and clinical decision tools ie [id number], etc  Document your independent review of radiology images and any outside records  Document your discussion with family members, caretakers and with consultants  Document social determinants of health affecting pt's care  Document your decision making why or why not admission, treatments were needed:32947:::1}   Final diagnoses:  Closed fracture of multiple ribs of right side, initial encounter  Syncope, unspecified syncope type    ED Discharge Orders     None        "

## 2024-07-18 ENCOUNTER — Emergency Department (HOSPITAL_COMMUNITY)

## 2024-07-18 ENCOUNTER — Encounter (HOSPITAL_COMMUNITY): Payer: Self-pay

## 2024-07-18 ENCOUNTER — Other Ambulatory Visit: Payer: Self-pay

## 2024-07-18 ENCOUNTER — Ambulatory Visit: Payer: Self-pay

## 2024-07-18 ENCOUNTER — Emergency Department (HOSPITAL_COMMUNITY)
Admission: EM | Admit: 2024-07-18 | Discharge: 2024-07-18 | Disposition: A | Discharge status: Home / Self Care | Attending: Emergency Medicine | Admitting: Emergency Medicine

## 2024-07-18 DIAGNOSIS — E039 Hypothyroidism, unspecified: Secondary | ICD-10-CM | POA: Diagnosis not present

## 2024-07-18 DIAGNOSIS — Z7982 Long term (current) use of aspirin: Secondary | ICD-10-CM | POA: Insufficient documentation

## 2024-07-18 DIAGNOSIS — R1032 Left lower quadrant pain: Secondary | ICD-10-CM | POA: Diagnosis not present

## 2024-07-18 DIAGNOSIS — I10 Essential (primary) hypertension: Secondary | ICD-10-CM | POA: Insufficient documentation

## 2024-07-18 DIAGNOSIS — R197 Diarrhea, unspecified: Secondary | ICD-10-CM | POA: Diagnosis not present

## 2024-07-18 DIAGNOSIS — Z950 Presence of cardiac pacemaker: Secondary | ICD-10-CM | POA: Diagnosis not present

## 2024-07-18 DIAGNOSIS — R0789 Other chest pain: Secondary | ICD-10-CM | POA: Insufficient documentation

## 2024-07-18 LAB — COMPREHENSIVE METABOLIC PANEL WITH GFR
ALT: 6 U/L (ref 0–44)
AST: 17 U/L (ref 15–41)
Albumin: 3.9 g/dL (ref 3.5–5.0)
Alkaline Phosphatase: 70 U/L (ref 38–126)
Anion gap: 10 (ref 5–15)
BUN: 7 mg/dL — ABNORMAL LOW (ref 8–23)
CO2: 25 mmol/L (ref 22–32)
Calcium: 8.5 mg/dL — ABNORMAL LOW (ref 8.9–10.3)
Chloride: 103 mmol/L (ref 98–111)
Creatinine, Ser: 0.77 mg/dL (ref 0.44–1.00)
GFR, Estimated: >60 mL/min
Glucose, Bld: 94 mg/dL (ref 70–99)
Potassium: 3.6 mmol/L (ref 3.5–5.1)
Sodium: 137 mmol/L (ref 135–145)
Total Bilirubin: 0.2 mg/dL (ref 0.0–1.2)
Total Protein: 6.5 g/dL (ref 6.5–8.1)

## 2024-07-18 LAB — CBC
HCT: 35.9 % — ABNORMAL LOW (ref 36.0–46.0)
Hemoglobin: 11.5 g/dL — ABNORMAL LOW (ref 12.0–15.0)
MCH: 29.8 pg (ref 26.0–34.0)
MCHC: 32 g/dL (ref 30.0–36.0)
MCV: 93 fL (ref 80.0–100.0)
Platelets: 216 K/uL (ref 150–400)
RBC: 3.86 MIL/uL — ABNORMAL LOW (ref 3.87–5.11)
RDW: 13.9 % (ref 11.5–15.5)
WBC: 5.5 K/uL (ref 4.0–10.5)
nRBC: 0 % (ref 0.0–0.2)

## 2024-07-18 LAB — URINALYSIS, ROUTINE W REFLEX MICROSCOPIC
Bilirubin Urine: NEGATIVE
Glucose, UA: NEGATIVE mg/dL
Hgb urine dipstick: NEGATIVE
Ketones, ur: NEGATIVE mg/dL
Leukocytes,Ua: NEGATIVE
Nitrite: NEGATIVE
Protein, ur: NEGATIVE mg/dL
Specific Gravity, Urine: 1.005 (ref 1.005–1.030)
pH: 7 (ref 5.0–8.0)

## 2024-07-18 LAB — LIPASE, BLOOD: Lipase: 20 U/L (ref 11–51)

## 2024-07-18 LAB — TROPONIN T, HIGH SENSITIVITY
Troponin T High Sensitivity: <15 ng/L (ref 0–19)
Troponin T High Sensitivity: <15 ng/L (ref 0–19)

## 2024-07-18 MED ORDER — IOHEXOL 350 MG/ML SOLN
75.0000 mL | Freq: Once | INTRAVENOUS | Status: AC | PRN
  Administered 2024-07-18: 75 mL via INTRAVENOUS

## 2024-07-18 MED ORDER — IOHEXOL 300 MG/ML  SOLN: 100.0000 mL | Freq: Once | INTRAMUSCULAR | Status: DC | PRN

## 2024-07-18 MED ORDER — HEPARIN SOD (PORK) LOCK FLUSH 100 UNIT/ML IV SOLN
500.0000 [IU] | Freq: Once | INTRAVENOUS | Status: AC
  Administered 2024-07-18: 500 [IU]
  Filled 2024-07-18: qty 5

## 2024-07-18 NOTE — ED Triage Notes (Signed)
 Pt c/o central chest pain and generalized abdominal pain starting this morning and intermittent diarrhea x4 days.  Pain score 6/10.  Pt was recently seen for a fall and the diarrhea.  Pt has 2 broken R ribs.   Pt has taken Pepto which has made her stools black.

## 2024-07-18 NOTE — Discharge Instructions (Signed)
 We evaluated you for your chest pain and your diarrhea.  Your testing in the emergency department was reassuring.  We did not see a dangerous cause of your chest pain or diarrhea.  Please follow-up closely with your primary doctor.  Please take 1000 mg of Tylenol  every 6 hours as needed for pain.  You can also take over-the-counter Imodium  for your diarrhea.  Please return if you have any new or worsening symptoms such as worsening pain, difficulty breathing, severe abdominal pain, vomiting, bloody stool, or any other concerning symptoms.

## 2024-07-18 NOTE — ED Notes (Signed)
 Pt gone to CT

## 2024-07-18 NOTE — Telephone Encounter (Signed)
" °  FYI Only or Action Required?: FYI only for provider: going to ED.  Patient was last seen in primary care on 04/21/2024 by Gladis Mustard, FNP.  Called Nurse Triage reporting Diarrhea.  Symptoms began several days ago.  Interventions attempted: OTC medications: pepto bismol.  Symptoms are: stable.  Triage Disposition: Go to ED Now (Notify PCP)  Patient/caregiver understands and will follow disposition?:    Copied from CRM [id number]. Topic: Clinical - Red Word Triage >> Jul 18, 2024 11:42 AM Haley Trevino wrote: Red Word that prompted transfer to Nurse Triage: Patient doesn't fell well. Patient states its her rib the second one she broke. She is in a lot of pain, she had her first bowel movement today since yesterday and its black, her blood pressure is......Haley Trevino  As I was waiting on her to check it my phone went out and the call was disconnected. Could you call the patient back at [phone] Reason for Disposition  MILD-MODERATE diarrhea (e.g., 1-6 times / day more than normal)  Answer Assessment - Initial Assessment Questions 1. DIARRHEA SEVERITY: How bad is the diarrhea? How many more stools have you had in the past 24 hours than normal?      mild 2. ONSET: When did the diarrhea begin?      Few days ago 3. STOOL DESCRIPTION:  How loose or watery is the diarrhea? What is the stool color? Is there any blood or mucous in the stool?     black 4. VOMITING: Are you also vomiting? If Yes, ask: How many times in the past 24 hours?      no 5. ABDOMEN PAIN: Are you having any abdomen pain? If Yes, ask: What does it feel like? (e.g., crampy, dull, intermittent, constant)      no 6. ABDOMEN PAIN SEVERITY: If present, ask: How bad is the pain?  (e.g., Scale 1-10; mild, moderate, or severe)     denies 7. ORAL INTAKE: If vomiting, Have you been able to drink liquids? How much liquids have you had in the past 24 hours?     Drinking well 8. HYDRATION: Any signs  of dehydration? (e.g., dry mouth [not just dry lips], too weak to stand, dizziness, new weight loss) When did you last urinate?     Urinating normally  Protocols used: Diarrhea-A-AH  "

## 2024-07-18 NOTE — ED Provider Notes (Signed)
 " [place] EMERGENCY DEPARTMENT AT [facility] Provider Note  CSN: [id number] Arrival date & time: 07/18/24 1310  Chief Complaint(s) Chest Pain, Abdominal Pain, and Diarrhea  HPI Haley Trevino is a 84 y.o. female history of hypertension, hyperlipidemia TAVR, chronic anemia, pacemaker presenting to the emergency department with chest pain.  Patient reports episode of chest pain earlier today, lasted briefly and resolved.  Also felt some shortness of breath.  No nausea or vomiting, cough.  Did have a fall on Friday and had some broken ribs but reports the pain from this is overall improving.  No pleuritic pain, recent travel, surgery.  No syncope, lightheadedness, dizziness.  Patient also reporting some lower abdominal pain, diarrhea.  No melena or hematochezia.  No nausea or vomiting.  She reports that she did have some of this previously that had resolved, but has returned today.  No fevers or chills.   Past Medical History Past Medical History:  Diagnosis Date   Cataract    Depression    GERD (gastroesophageal reflux disease)    Hyperlipidemia    Hypertension    Hypothyroidism    IBS (irritable bowel syndrome)    Osteoarthritis    Raynaud disease    S/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 Wendel & Bartle   Scleroderma [facility])    Thyroid  disease    hypothyroidism   Patient Active Problem List   Diagnosis Date Noted   PAF (paroxysmal atrial fibrillation) (HCC) 02/08/2024   GI bleed 11/23/2023   Lower abdominal pain 11/23/2023   Dizziness 09/22/2023   Sensorineural hearing loss, bilateral 09/22/2023   Impacted cerumen of both ears 09/22/2023   Syncope 05/19/2023   Pulmonary HTN (HCC) 12/18/2022   Heart block AV complete (HCC) 05/27/2022   S/P TAVR (transcatheter aortic valve replacement) 05/20/2022   Anxiety 05/14/2022   Hyperkalemia 05/14/2022   Chronic diastolic CHF  (congestive heart failure) (HCC) 05/13/2022   Acute respiratory distress 05/13/2022   Nonrheumatic aortic valve stenosis 04/28/2022   Precordial chest pain 03/13/2022   History of adenomatous polyp of colon 09/21/2020   Weight loss 09/21/2020   Irritable bowel syndrome 09/21/2020   Breast implant rupture 09/04/2020   Iron  deficiency anemia due to chronic blood loss 08/28/2020   DOE (dyspnea on exertion) 11/18/2017   Murmur 11/18/2017   Impingement syndrome of left shoulder region 12/08/2014   Osteoporosis 09/20/2014   Hip fracture requiring operative repair (HCC) 02/22/2014   Hypothyroidism 11/29/2013   Depression 11/29/2013   GERD (gastroesophageal reflux disease) 11/29/2013   Hypertension 11/29/2013   Hyperlipidemia with target LDL less than 100 11/29/2013   Thyroid  cyst 04/26/2013   Melanoma of skin (HCC) 08/07/2010   [name] 08/06/2010   SCLERODERMA 08/06/2010   Osteoarthritis 08/06/2010   Home Medication(s) Prior to Admission medications  Medication Sig Start Date End Date Taking? Authorizing Provider  acetaminophen  (TYLENOL ) 500 MG tablet Take 1,000 mg by mouth at bedtime.    [provider]  aspirin  EC 81 MG tablet Take 81 mg by mouth daily. Swallow whole.    [provider]  clonazePAM  (KLONOPIN ) 0.5 MG tablet Take 1 tablet (0.5 mg total) by mouth 2 (two) times daily as needed for anxiety. 02/08/24   Gladis Mary-Margaret, FNP  ezetimibe  (ZETIA ) 10 MG tablet Take 1 tablet (10 mg total) by mouth daily. 02/08/24   Gladis Mary-Margaret, FNP  FLUoxetine  (PROZAC ) 40 MG  capsule Take 1 capsule (40 mg total) by mouth daily. 02/08/24   Gladis Mary-Margaret, FNP  fluticasone  (FLONASE ) 50 MCG/ACT nasal spray PLACE 2 SPRAYS INTO BOTH NOSTRILS DAILY 04/21/24   Gladis, Mary-Margaret, FNP  HYDROcodone -acetaminophen  (NORCO/VICODIN) 5-325 MG tablet Take 1 tablet by mouth every 6 (six) hours as needed for moderate pain (pain score 4-6). 07/11/24   Patsey Lot, MD   lidocaine -prilocaine  (EMLA ) cream Apply 1 Application topically as needed. 02/19/24   Davonna Siad, MD  NIFEdipine  (PROCARDIA  XL/NIFEDICAL XL) 60 MG 24 hr tablet Take 1 tablet (60 mg total) by mouth daily. 02/08/24   Gladis Mustard, FNP  omeprazole  (PRILOSEC) 20 MG capsule TAKE 1 TABLET DAILY 02/08/24   Gladis, Mary-Margaret, FNP  RESTASIS  0.05 % ophthalmic emulsion Place 1 drop into both eyes 2 (two) times daily. 03/26/22   [provider]  sildenafil  (REVATIO ) 20 MG tablet Take 1 tablet (20 mg total) by mouth 2 (two) times daily. 02/08/24   Gladis Mustard, FNP  SYNTHROID  75 MCG tablet TAKE (1) TABLET DAILY BE- FORE BREAKFAST. 02/08/24   Gladis Mustard, FNP                                                                                                                                    Past Surgical History Past Surgical History:  Procedure Laterality Date   BREAST ENHANCEMENT SURGERY  1975   BREAST IMPLANT REMOVAL Bilateral 04/23/2016   Procedure: REMOVALBILATERAL BREAST IMPLANTS;  Surgeon: Estefana GORMAN Fritter, DO;  Location: [facility];  Service: Plastics;  Laterality: Bilateral;   CHOLECYSTECTOMY  1973   ESOPHAGOGASTRODUODENOSCOPY N/A 10/25/2020   Procedure: ESOPHAGOGASTRODUODENOSCOPY (EGD);  Surgeon: Rosalie Kitchens, MD;  Location: [facility];  Service: Endoscopy;  Laterality: N/A;  enteroscopy   HAND SURGERY  08/2012; 11/2012   HOT HEMOSTASIS N/A 10/25/2020   Procedure: HOT HEMOSTASIS (ARGON PLASMA COAGULATION/BICAP);  Surgeon: Rosalie Kitchens, MD;  Location: [facility];  Service: Endoscopy;  Laterality: N/A;   INTRAOPERATIVE TRANSTHORACIC ECHOCARDIOGRAM N/A 05/13/2022   Procedure: INTRAOPERATIVE TRANSTHORACIC ECHOCARDIOGRAM;  Surgeon: Verlin Lonni BIRCH, MD;  Location: [facility];  Service: Open Heart Surgery;  Laterality: N/A;   INTRAOPERATIVE TRANSTHORACIC ECHOCARDIOGRAM N/A 05/20/2022   Procedure: INTRAOPERATIVE TRANSTHORACIC  ECHOCARDIOGRAM;  Surgeon: Wendel Lurena POUR, MD;  Location: [facility] OR;  Service: Open Heart Surgery;  Laterality: N/A;   IR IMAGING GUIDED PORT INSERTION  02/12/2024   IR RADIOLOGIST EVAL & MGMT  07/27/2017   IR SACROPLASTY BILATERAL  07/31/2017   IR VERTEBROPLASTY CERV/THOR BX INC UNI/BIL INC/INJECT/IMAGING  03/13/2020   IR VERTEBROPLASTY EA ADDL (T&LS) BX INC UNI/BIL INC INJECT/IMAGING  03/14/2020   MELANOMA EXCISION     at 31 yrs of age   ORIF HIP FRACTURE Right 02/22/2014   Procedure: OPEN REDUCTION INTERNAL FIXATION RIGHT HIP;  Surgeon: Lemond Stable, MD;  Location: [facility];  Service: Orthopedics;  Laterality: Right;   PACEMAKER IMPLANT N/A 05/27/2022  Procedure: PACEMAKER IMPLANT;  Surgeon: Cindie Ole DASEN, MD;  Location: [facility];  Service: Cardiovascular;  Laterality: N/A;   RIGHT HEART CATH N/A 01/16/2023   Procedure: RIGHT HEART CATH;  Surgeon: Gardenia Led, DO;  Location: [facility];  Service: Cardiovascular;  Laterality: N/A;   RIGHT HEART CATH AND CORONARY ANGIOGRAPHY N/A 03/28/2022   Procedure: RIGHT HEART CATH AND CORONARY ANGIOGRAPHY;  Surgeon: Wendel Lurena POUR, MD;  Location: [facility];  Service: Cardiovascular;  Laterality: N/A;   TOTAL ABDOMINAL HYSTERECTOMY  1974   TRANSCATHETER AORTIC VALVE REPLACEMENT, TRANSFEMORAL Left 05/13/2022   Procedure: Transcatheter Aortic Valve Replacement, Transfemoral;  Surgeon: Verlin Lonni BIRCH, MD;  Location: [facility];  Service: Open Heart Surgery;  Laterality: Left;   TRANSCATHETER AORTIC VALVE REPLACEMENT, TRANSFEMORAL Left 05/20/2022   Procedure: Transcatheter Aortic Valve Replacement, Transfemoral using an Edwards SAPIEN 3 Ultra 23 MM Heart Valve;  Surgeon: Wendel Lurena POUR, MD;  Location: [facility];  Service: Open Heart Surgery;  Laterality: Left;  Left Transfemoral   Family History Family History  Problem Relation Age of Onset   Pancreatic cancer Father    Raynaud syndrome Father    Cancer Father         pancreatic   Rashes / Skin problems Daughter        possibly scleraderma   Hip fracture Mother    Mental illness Mother        attempted suicide at 64 yo    Social History Social History[1] Allergies Bactrim  [sulfamethoxazole -trimethoprim ], Codeine, Dilaudid [hydromorphone hcl], Eliquis  [apixaban ], Acyclovir and related, Crestor  [rosuvastatin  calcium ], Gabapentin, Naproxen, Aleve [naproxen sodium], and Lyrica [pregabalin]  Review of Systems Review of Systems  All other systems reviewed and are negative.   Physical Exam Vital Signs  I have reviewed the triage vital signs BP (!) 133/119   Pulse 66   Temp 98.9 F (37.2 C) (Oral)   Resp 17   Ht 5' 2 (1.575 m)   Wt 46.3 kg   SpO2 95%   BMI 18.66 kg/m  Physical Exam Vitals and nursing note reviewed.  Constitutional:      General: She is not in acute distress.    Appearance: She is well-developed.  HENT:     Head: Normocephalic and atraumatic.     Mouth/Throat:     Mouth: Mucous membranes are moist.  Eyes:     Pupils: Pupils are equal, round, and reactive to light.  Cardiovascular:     Rate and Rhythm: Normal rate and regular rhythm.     Heart sounds: No murmur heard. Pulmonary:     Effort: Pulmonary effort is normal. No respiratory distress.     Breath sounds: Normal breath sounds.  Chest:     Chest wall: No tenderness.  Abdominal:     General: Abdomen is flat.     Palpations: Abdomen is soft.     Tenderness: There is abdominal tenderness in the left lower quadrant.  Musculoskeletal:        General: No tenderness.     Right lower leg: No edema.     Left lower leg: No edema.  Skin:    General: Skin is warm and dry.  Neurological:     General: No focal deficit present.     Mental Status: She is alert. Mental status is at baseline.  Psychiatric:        Mood and Affect: Mood normal.        Behavior: Behavior normal.  ED Results and Treatments Labs (all labs ordered are listed, but only abnormal  results are displayed) Labs Reviewed  COMPREHENSIVE METABOLIC PANEL WITH GFR - Abnormal; Notable for the following components:      Result Value   BUN 7 (*)    Calcium  8.5 (*)    All other components within normal limits  CBC - Abnormal; Notable for the following components:   RBC 3.86 (*)    Hemoglobin 11.5 (*)    HCT 35.9 (*)    All other components within normal limits  URINALYSIS, ROUTINE W REFLEX MICROSCOPIC - Abnormal; Notable for the following components:   Color, Urine STRAW (*)    All other components within normal limits  LIPASE, BLOOD  TROPONIN T, HIGH SENSITIVITY  TROPONIN T, HIGH SENSITIVITY                                                                                                                          Radiology CT ABDOMEN PELVIS W CONTRAST Result Date: 07/18/2024 CLINICAL DATA:  Generalized abdominal pain with diarrhea EXAM: CT ABDOMEN AND PELVIS WITH CONTRAST TECHNIQUE: Multidetector CT imaging of the abdomen and pelvis was performed using the standard protocol following bolus administration of intravenous contrast. RADIATION DOSE REDUCTION: This exam was performed according to the departmental dose-optimization program which includes automated exposure control, adjustment of the mA and/or kV according to patient size and/or use of iterative reconstruction technique. CONTRAST:  75mL OMNIPAQUE  IOHEXOL  350 MG/ML SOLN COMPARISON:  CT 11/22/2023, 06/10/2018 FINDINGS: Lower chest: Lung bases demonstrate no acute airspace disease. Intracardiac pacing leads. Partially visualized aortic valve prosthesis. Mitral calcifications Hepatobiliary: Cholecystectomy. Mild intra and extrahepatic biliary dilatation without significant change probably due to postsurgical change Pancreas: Atrophic.  No inflammation Spleen: Splenule anterior to the spleen. Adrenals/Urinary Tract: Adrenal glands are normal. Kidneys show no hydronephrosis. Cyst in the right kidney. The bladder is unremarkable  Stomach/Bowel: The stomach is nonenlarged. There is no dilated small bowel. Decompressed appearance of the colon. No convincing wall thickening. Vascular/Lymphatic: Aortic atherosclerosis. No enlarged abdominal or pelvic lymph nodes. Reproductive: Hysterectomy. Right adnexal cystic density measuring 4.3 x 1.7 cm on coronal series 4, image 74. this has been present since 2019, no specific imaging follow-up recommended Other: No ascites or free air Musculoskeletal: Hardware in the right femur. Old inferior pubic rami fractures. Post augmentation changes of the bilateral sacrum IMPRESSION: 1. No CT evidence for acute intra-abdominal or pelvic abnormality. The colon appears diffusely decreased compressed, no definitive wall thickening or pericolonic inflammation. 2. Aortic atherosclerosis. Aortic Atherosclerosis (ICD10-I70.0). Electronically Signed   By: Luke Bun M.D.   On: 07/18/2024 17:48   DG Chest 2 View Result Date: 07/18/2024 EXAM: 2 VIEW(S) XRAY OF THE CHEST 07/18/2024 02:36:00 PM COMPARISON: 07/16/2024 CLINICAL HISTORY: chest pain FINDINGS: LINES, TUBES AND DEVICES: Right Port-A-Cath in place with tip in distal SVC. Stable left subclavian dual lead transvenous pacemaker. LUNGS AND PLEURA: No focal pulmonary opacity. No pleural effusion. No pneumothorax. HEART  AND MEDIASTINUM: Calcified aorta. Stable postoperative TAVR changes. No acute abnormality of the cardiac and mediastinal silhouettes. BONES AND SOFT TISSUES: Stable compression deformities in mid thoracic spine with multilevel vertebroplasty. Left shoulder degenerative joint disease. IMPRESSION: 1. No acute cardiopulmonary abnormality. Electronically signed by: Waddell Calk MD MD 07/18/2024 03:54 PM EST RP Workstation: [id number]    Pertinent labs & imaging results that were available during my care of the patient were reviewed by me and considered in my medical decision making (see MDM for details).  Medications Ordered in ED Medications   iohexol  (OMNIPAQUE ) 350 MG/ML injection 75 mL (75 mLs Intravenous Contrast Given 07/18/24 1553)                                                                                                                                     Procedures Procedures  (including critical care time)  Medical Decision Making / ED Course   MDM:  84 year old presenting to the emergency department with chest pain this morning that this resolved, as well as diarrhea and lower abdominal pain.  Given chest pain, will check labs, EKG, chest x-ray.  Differential includes musculoskeletal pain from rib fracture, pneumothorax, pneumonia, ACS.  No risk factors or symptoms suggestive of PE and vital signs are reassuring.  Symptoms at this point are all resolved.  If workup reassuring low concern for dangerous cause of symptoms.  Patient also reporting persistent diarrhea and lower abdominal pain.  She does have some tenderness on exam.  Differential includes diverticulitis, diarrheal illness, colitis.  Will check CT abdomen.  Clinical Course as of 07/18/24 1754  Mon Jul 18, 2024  1753 Workup is reassuring including troponin testing, chest x-ray, CT abdomen.  No evidence of acute process.  Feel patient is stable for discharge with further outpatient follow-up. Will discharge patient to home. All questions answered. Patient comfortable with plan of discharge. Return precautions discussed with patient and specified on the after visit summary.  [WS]    Clinical Course User Index [WS] Francesca Elsie CROME, MD     Additional history obtained: -Additional history obtained from spouse -External records from outside source obtained and reviewed including: Chart review including previous notes, labs, imaging, consultation notes including prior notes    Lab Tests: -I ordered, reviewed, and interpreted labs.   The pertinent results include:   Labs Reviewed  COMPREHENSIVE METABOLIC PANEL WITH GFR - Abnormal; Notable for the  following components:      Result Value   BUN 7 (*)    Calcium  8.5 (*)    All other components within normal limits  CBC - Abnormal; Notable for the following components:   RBC 3.86 (*)    Hemoglobin 11.5 (*)    HCT 35.9 (*)    All other components within normal limits  URINALYSIS, ROUTINE W REFLEX MICROSCOPIC - Abnormal; Notable for the following components:   Color, Urine STRAW (*)    All  other components within normal limits  LIPASE, BLOOD  TROPONIN T, HIGH SENSITIVITY  TROPONIN T, HIGH SENSITIVITY    Notable for mild anemia   EKG   EKG Interpretation Date/Time:  Monday July 18 2024 14:16:26 EST Ventricular Rate:  72 PR Interval:  181 QRS Duration:  89 QT Interval:  426 QTC Calculation: 467 R Axis:   -83  Text Interpretation: Sinus rhythm Ventricular trigeminy LAD, consider left anterior fascicular block Probable left ventricular hypertrophy Anterior Q waves, possibly due to LVH Confirmed by Francesca Fallow ([id number]) on 07/18/2024 2:51:52 PM         Imaging Studies ordered: I ordered imaging studies including CXR, CT abdomen  On my interpretation imaging demonstrates no acute process I independently visualized and interpreted imaging. I agree with the radiologist interpretation   Medicines ordered and prescription drug management: Meds ordered this encounter  Medications   DISCONTD: iohexol  (OMNIPAQUE ) 300 MG/ML solution 100 mL   iohexol  (OMNIPAQUE ) 350 MG/ML injection 75 mL    -I have reviewed the patients home medicines and have made adjustments as needed   Reevaluation: After the interventions noted above, I reevaluated the patient and found that their symptoms have improved  Co morbidities that complicate the patient evaluation  Past Medical History:  Diagnosis Date   Cataract    Depression    GERD (gastroesophageal reflux disease)    Hyperlipidemia    Hypertension    Hypothyroidism    IBS (irritable bowel syndrome)    Osteoarthritis     Raynaud disease    S/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 Wendel & Bartle   Scleroderma [facility])    Thyroid  disease    hypothyroidism      Dispostion: Disposition decision including need for hospitalization was considered, and patient discharged    Final Clinical Impression(s) / ED Diagnoses Final diagnoses:  Atypical chest pain  Diarrhea, unspecified type     This chart was dictated using voice recognition software.  Despite best efforts to proofread,  errors can occur which can change the documentation meaning.     [1]  Social History Tobacco Use   Smoking status: Never   Smokeless tobacco: Never   Tobacco comments:    passive tobacco smoke exposure as child  Vaping Use   Vaping status: Never Used  Substance Use Topics   Alcohol use: No   Drug use: No     Francesca Fallow CROME, MD 07/18/24 1754  "

## 2024-07-19 ENCOUNTER — Ambulatory Visit

## 2024-08-15 ENCOUNTER — Inpatient Hospital Stay: Attending: Hematology

## 2024-08-17 ENCOUNTER — Inpatient Hospital Stay: Admitting: Physician Assistant

## 2024-08-23 ENCOUNTER — Encounter: Admitting: Physical Medicine and Rehabilitation

## 2024-09-21 ENCOUNTER — Ambulatory Visit (INDEPENDENT_AMBULATORY_CARE_PROVIDER_SITE_OTHER): Admitting: Otolaryngology
